# Patient Record
Sex: Female | Born: 1950
Health system: Southern US, Community
[De-identification: ages and names within clinical notes are randomized; demographics above are authoritative.]

## PROBLEM LIST (undated history)

## (undated) DIAGNOSIS — E785 Hyperlipidemia, unspecified: Secondary | ICD-10-CM

## (undated) DIAGNOSIS — K219 Gastro-esophageal reflux disease without esophagitis: Secondary | ICD-10-CM

## (undated) DIAGNOSIS — C679 Malignant neoplasm of bladder, unspecified: Secondary | ICD-10-CM

## (undated) DIAGNOSIS — E669 Obesity, unspecified: Secondary | ICD-10-CM

## (undated) DIAGNOSIS — N183 Chronic kidney disease, stage 3 unspecified: Secondary | ICD-10-CM

## (undated) DIAGNOSIS — Z800 Family history of malignant neoplasm of digestive organs: Secondary | ICD-10-CM

## (undated) DIAGNOSIS — H269 Unspecified cataract: Secondary | ICD-10-CM

## (undated) DIAGNOSIS — Z5189 Encounter for other specified aftercare: Secondary | ICD-10-CM

## (undated) DIAGNOSIS — Z9989 Dependence on other enabling machines and devices: Secondary | ICD-10-CM

## (undated) DIAGNOSIS — Z8542 Personal history of malignant neoplasm of other parts of uterus: Secondary | ICD-10-CM

## (undated) DIAGNOSIS — K632 Fistula of intestine: Secondary | ICD-10-CM

## (undated) DIAGNOSIS — R768 Other specified abnormal immunological findings in serum: Secondary | ICD-10-CM

## (undated) DIAGNOSIS — N189 Chronic kidney disease, unspecified: Secondary | ICD-10-CM

## (undated) DIAGNOSIS — Z86010 Personal history of colonic polyps: Secondary | ICD-10-CM

## (undated) DIAGNOSIS — D631 Anemia in chronic kidney disease: Secondary | ICD-10-CM

## (undated) DIAGNOSIS — E1121 Type 2 diabetes mellitus with diabetic nephropathy: Secondary | ICD-10-CM

## (undated) DIAGNOSIS — Z803 Family history of malignant neoplasm of breast: Secondary | ICD-10-CM

## (undated) DIAGNOSIS — Z85038 Personal history of other malignant neoplasm of large intestine: Secondary | ICD-10-CM

## (undated) DIAGNOSIS — I4510 Unspecified right bundle-branch block: Secondary | ICD-10-CM

## (undated) DIAGNOSIS — I10 Essential (primary) hypertension: Secondary | ICD-10-CM

## (undated) DIAGNOSIS — D509 Iron deficiency anemia, unspecified: Secondary | ICD-10-CM

## (undated) DIAGNOSIS — M47816 Spondylosis without myelopathy or radiculopathy, lumbar region: Secondary | ICD-10-CM

## (undated) DIAGNOSIS — F319 Bipolar disorder, unspecified: Secondary | ICD-10-CM

## (undated) DIAGNOSIS — E1165 Type 2 diabetes mellitus with hyperglycemia: Secondary | ICD-10-CM

## (undated) DIAGNOSIS — Z8551 Personal history of malignant neoplasm of bladder: Secondary | ICD-10-CM

## (undated) DIAGNOSIS — C189 Malignant neoplasm of colon, unspecified: Secondary | ICD-10-CM

## (undated) DIAGNOSIS — G4733 Obstructive sleep apnea (adult) (pediatric): Secondary | ICD-10-CM

## (undated) HISTORY — DX: Unspecified right bundle-branch block: I45.10

## (undated) HISTORY — DX: Spondylosis without myelopathy or radiculopathy, lumbar region: M47.816

## (undated) HISTORY — DX: Bipolar disorder, unspecified: F31.9

## (undated) HISTORY — DX: Dependence on other enabling machines and devices: Z99.89

## (undated) HISTORY — DX: Type 2 diabetes mellitus with hyperglycemia: E11.65

## (undated) HISTORY — DX: Anemia in chronic kidney disease: D63.1

## (undated) HISTORY — DX: Obstructive sleep apnea (adult) (pediatric): G47.33

## (undated) HISTORY — DX: Hyperlipidemia, unspecified: E78.5

## (undated) HISTORY — DX: Unspecified cataract: H26.9

## (undated) HISTORY — DX: Family history of malignant neoplasm of digestive organs: Z80.0

## (undated) HISTORY — DX: Chronic kidney disease, stage 3 (moderate): N18.3

## (undated) HISTORY — DX: Chronic kidney disease, unspecified: N18.9

## (undated) HISTORY — DX: Personal history of colonic polyps: Z86.010

## (undated) HISTORY — DX: Personal history of other malignant neoplasm of large intestine: Z85.038

## (undated) HISTORY — DX: Encounter for other specified aftercare: Z51.89

## (undated) HISTORY — PX: PARTIAL COLECTOMY: SHX5273

## (undated) HISTORY — DX: Type 2 diabetes mellitus with diabetic nephropathy: E11.21

## (undated) HISTORY — DX: Personal history of malignant neoplasm of bladder: Z85.51

## (undated) HISTORY — DX: Iron deficiency anemia, unspecified: D50.9

## (undated) HISTORY — DX: Family history of malignant neoplasm of breast: Z80.3

## (undated) HISTORY — DX: Other specified abnormal immunological findings in serum: R76.8

## (undated) HISTORY — DX: Gastro-esophageal reflux disease without esophagitis: K21.9

## (undated) HISTORY — DX: Personal history of malignant neoplasm of other parts of uterus: Z85.42

## (undated) HISTORY — DX: Chronic kidney disease, stage 3 unspecified: N18.30

---

## 1979-08-05 HISTORY — PX: PARTIAL HYSTERECTOMY: SHX80

## 1989-04-04 DIAGNOSIS — C189 Malignant neoplasm of colon, unspecified: Secondary | ICD-10-CM

## 1989-04-04 DIAGNOSIS — C679 Malignant neoplasm of bladder, unspecified: Secondary | ICD-10-CM

## 1989-04-04 HISTORY — DX: Malignant neoplasm of bladder, unspecified: C67.9

## 1989-04-04 HISTORY — DX: Malignant neoplasm of colon, unspecified: C18.9

## 1995-08-05 DIAGNOSIS — Z8551 Personal history of malignant neoplasm of bladder: Secondary | ICD-10-CM

## 1995-08-05 HISTORY — DX: Personal history of malignant neoplasm of bladder: Z85.51

## 2003-08-05 HISTORY — PX: LEFT OOPHORECTOMY: SHX1961

## 2007-08-05 DIAGNOSIS — Z5189 Encounter for other specified aftercare: Secondary | ICD-10-CM

## 2007-08-05 HISTORY — DX: Encounter for other specified aftercare: Z51.89

## 2009-07-04 HISTORY — PX: COLONOSCOPY: SHX174

## 2009-12-02 HISTORY — PX: OTHER SURGICAL HISTORY: SHX169

## 2009-12-02 HISTORY — PX: DOBUTAMINE STRESS ECHO: SHX5426

## 2009-12-19 LAB — HM DEXA SCAN

## 2011-08-05 DIAGNOSIS — Z85038 Personal history of other malignant neoplasm of large intestine: Secondary | ICD-10-CM | POA: Insufficient documentation

## 2011-08-13 DIAGNOSIS — E669 Obesity, unspecified: Secondary | ICD-10-CM | POA: Diagnosis not present

## 2011-08-13 DIAGNOSIS — E7800 Pure hypercholesterolemia, unspecified: Secondary | ICD-10-CM | POA: Diagnosis not present

## 2011-08-13 DIAGNOSIS — E559 Vitamin D deficiency, unspecified: Secondary | ICD-10-CM | POA: Diagnosis not present

## 2011-08-13 DIAGNOSIS — I10 Essential (primary) hypertension: Secondary | ICD-10-CM | POA: Diagnosis not present

## 2011-08-13 LAB — LIPID PANEL
Cholesterol: 160 mg/dL (ref 0–200)
Direct LDL: 91

## 2011-08-15 DIAGNOSIS — I10 Essential (primary) hypertension: Secondary | ICD-10-CM | POA: Diagnosis not present

## 2011-08-15 DIAGNOSIS — E7800 Pure hypercholesterolemia, unspecified: Secondary | ICD-10-CM | POA: Diagnosis not present

## 2011-08-18 DIAGNOSIS — M2390 Unspecified internal derangement of unspecified knee: Secondary | ICD-10-CM | POA: Diagnosis not present

## 2011-08-18 DIAGNOSIS — M1710 Unilateral primary osteoarthritis, unspecified knee: Secondary | ICD-10-CM | POA: Diagnosis not present

## 2011-09-08 DIAGNOSIS — F329 Major depressive disorder, single episode, unspecified: Secondary | ICD-10-CM | POA: Diagnosis not present

## 2011-09-08 DIAGNOSIS — F259 Schizoaffective disorder, unspecified: Secondary | ICD-10-CM | POA: Diagnosis not present

## 2011-09-19 DIAGNOSIS — E7800 Pure hypercholesterolemia, unspecified: Secondary | ICD-10-CM | POA: Diagnosis not present

## 2011-09-19 DIAGNOSIS — M2390 Unspecified internal derangement of unspecified knee: Secondary | ICD-10-CM | POA: Diagnosis not present

## 2011-09-19 DIAGNOSIS — M159 Polyosteoarthritis, unspecified: Secondary | ICD-10-CM | POA: Diagnosis not present

## 2011-09-19 DIAGNOSIS — G4733 Obstructive sleep apnea (adult) (pediatric): Secondary | ICD-10-CM | POA: Diagnosis not present

## 2011-09-22 DIAGNOSIS — I4510 Unspecified right bundle-branch block: Secondary | ICD-10-CM | POA: Diagnosis not present

## 2011-09-22 DIAGNOSIS — Z0389 Encounter for observation for other suspected diseases and conditions ruled out: Secondary | ICD-10-CM | POA: Diagnosis not present

## 2011-09-22 DIAGNOSIS — I10 Essential (primary) hypertension: Secondary | ICD-10-CM | POA: Diagnosis not present

## 2011-09-22 DIAGNOSIS — Z01810 Encounter for preprocedural cardiovascular examination: Secondary | ICD-10-CM | POA: Diagnosis not present

## 2011-09-25 DIAGNOSIS — I10 Essential (primary) hypertension: Secondary | ICD-10-CM | POA: Diagnosis not present

## 2011-09-25 DIAGNOSIS — Z01818 Encounter for other preprocedural examination: Secondary | ICD-10-CM | POA: Diagnosis not present

## 2011-10-16 DIAGNOSIS — M2390 Unspecified internal derangement of unspecified knee: Secondary | ICD-10-CM | POA: Diagnosis not present

## 2011-10-16 DIAGNOSIS — IMO0002 Reserved for concepts with insufficient information to code with codable children: Secondary | ICD-10-CM | POA: Diagnosis not present

## 2011-10-16 DIAGNOSIS — F329 Major depressive disorder, single episode, unspecified: Secondary | ICD-10-CM | POA: Diagnosis not present

## 2011-10-16 DIAGNOSIS — S83509A Sprain of unspecified cruciate ligament of unspecified knee, initial encounter: Secondary | ICD-10-CM | POA: Diagnosis not present

## 2011-10-16 DIAGNOSIS — E785 Hyperlipidemia, unspecified: Secondary | ICD-10-CM | POA: Diagnosis not present

## 2011-10-16 DIAGNOSIS — I10 Essential (primary) hypertension: Secondary | ICD-10-CM | POA: Diagnosis not present

## 2011-10-16 DIAGNOSIS — S83289A Other tear of lateral meniscus, current injury, unspecified knee, initial encounter: Secondary | ICD-10-CM | POA: Diagnosis not present

## 2011-10-16 DIAGNOSIS — M6789 Other specified disorders of synovium and tendon, multiple sites: Secondary | ICD-10-CM | POA: Diagnosis not present

## 2011-10-16 DIAGNOSIS — M2340 Loose body in knee, unspecified knee: Secondary | ICD-10-CM | POA: Diagnosis not present

## 2011-10-16 DIAGNOSIS — G4730 Sleep apnea, unspecified: Secondary | ICD-10-CM | POA: Diagnosis not present

## 2011-10-16 DIAGNOSIS — M1710 Unilateral primary osteoarthritis, unspecified knee: Secondary | ICD-10-CM | POA: Diagnosis not present

## 2011-11-10 DIAGNOSIS — I10 Essential (primary) hypertension: Secondary | ICD-10-CM | POA: Diagnosis not present

## 2011-11-10 DIAGNOSIS — L258 Unspecified contact dermatitis due to other agents: Secondary | ICD-10-CM | POA: Diagnosis not present

## 2011-11-10 DIAGNOSIS — E7800 Pure hypercholesterolemia, unspecified: Secondary | ICD-10-CM | POA: Diagnosis not present

## 2011-11-11 DIAGNOSIS — F259 Schizoaffective disorder, unspecified: Secondary | ICD-10-CM | POA: Diagnosis not present

## 2011-11-11 DIAGNOSIS — F329 Major depressive disorder, single episode, unspecified: Secondary | ICD-10-CM | POA: Diagnosis not present

## 2011-11-11 DIAGNOSIS — IMO0001 Reserved for inherently not codable concepts without codable children: Secondary | ICD-10-CM | POA: Diagnosis not present

## 2011-11-11 DIAGNOSIS — M1710 Unilateral primary osteoarthritis, unspecified knee: Secondary | ICD-10-CM | POA: Diagnosis not present

## 2011-11-11 DIAGNOSIS — Z96659 Presence of unspecified artificial knee joint: Secondary | ICD-10-CM | POA: Diagnosis not present

## 2011-11-11 DIAGNOSIS — R262 Difficulty in walking, not elsewhere classified: Secondary | ICD-10-CM | POA: Diagnosis not present

## 2011-11-13 DIAGNOSIS — IMO0001 Reserved for inherently not codable concepts without codable children: Secondary | ICD-10-CM | POA: Diagnosis not present

## 2011-11-13 DIAGNOSIS — M1710 Unilateral primary osteoarthritis, unspecified knee: Secondary | ICD-10-CM | POA: Diagnosis not present

## 2011-11-13 DIAGNOSIS — Z96659 Presence of unspecified artificial knee joint: Secondary | ICD-10-CM | POA: Diagnosis not present

## 2011-11-13 DIAGNOSIS — R262 Difficulty in walking, not elsewhere classified: Secondary | ICD-10-CM | POA: Diagnosis not present

## 2011-11-14 DIAGNOSIS — M1710 Unilateral primary osteoarthritis, unspecified knee: Secondary | ICD-10-CM | POA: Diagnosis not present

## 2011-11-14 DIAGNOSIS — R262 Difficulty in walking, not elsewhere classified: Secondary | ICD-10-CM | POA: Diagnosis not present

## 2011-11-14 DIAGNOSIS — IMO0001 Reserved for inherently not codable concepts without codable children: Secondary | ICD-10-CM | POA: Diagnosis not present

## 2011-11-14 DIAGNOSIS — Z96659 Presence of unspecified artificial knee joint: Secondary | ICD-10-CM | POA: Diagnosis not present

## 2011-11-17 DIAGNOSIS — M1710 Unilateral primary osteoarthritis, unspecified knee: Secondary | ICD-10-CM | POA: Diagnosis not present

## 2011-11-17 DIAGNOSIS — Z96659 Presence of unspecified artificial knee joint: Secondary | ICD-10-CM | POA: Diagnosis not present

## 2011-11-17 DIAGNOSIS — R262 Difficulty in walking, not elsewhere classified: Secondary | ICD-10-CM | POA: Diagnosis not present

## 2011-11-17 DIAGNOSIS — IMO0001 Reserved for inherently not codable concepts without codable children: Secondary | ICD-10-CM | POA: Diagnosis not present

## 2011-11-18 DIAGNOSIS — M1710 Unilateral primary osteoarthritis, unspecified knee: Secondary | ICD-10-CM | POA: Diagnosis not present

## 2011-11-18 DIAGNOSIS — Z96659 Presence of unspecified artificial knee joint: Secondary | ICD-10-CM | POA: Diagnosis not present

## 2011-11-18 DIAGNOSIS — IMO0001 Reserved for inherently not codable concepts without codable children: Secondary | ICD-10-CM | POA: Diagnosis not present

## 2011-11-18 DIAGNOSIS — R262 Difficulty in walking, not elsewhere classified: Secondary | ICD-10-CM | POA: Diagnosis not present

## 2011-11-21 DIAGNOSIS — M1710 Unilateral primary osteoarthritis, unspecified knee: Secondary | ICD-10-CM | POA: Diagnosis not present

## 2011-11-21 DIAGNOSIS — IMO0001 Reserved for inherently not codable concepts without codable children: Secondary | ICD-10-CM | POA: Diagnosis not present

## 2011-11-21 DIAGNOSIS — Z96659 Presence of unspecified artificial knee joint: Secondary | ICD-10-CM | POA: Diagnosis not present

## 2011-11-21 DIAGNOSIS — R262 Difficulty in walking, not elsewhere classified: Secondary | ICD-10-CM | POA: Diagnosis not present

## 2011-11-24 DIAGNOSIS — M2390 Unspecified internal derangement of unspecified knee: Secondary | ICD-10-CM | POA: Diagnosis not present

## 2011-12-08 DIAGNOSIS — L299 Pruritus, unspecified: Secondary | ICD-10-CM | POA: Diagnosis not present

## 2011-12-08 DIAGNOSIS — L538 Other specified erythematous conditions: Secondary | ICD-10-CM | POA: Diagnosis not present

## 2011-12-08 DIAGNOSIS — L738 Other specified follicular disorders: Secondary | ICD-10-CM | POA: Diagnosis not present

## 2012-01-05 DIAGNOSIS — K432 Incisional hernia without obstruction or gangrene: Secondary | ICD-10-CM | POA: Diagnosis not present

## 2012-01-19 DIAGNOSIS — E7800 Pure hypercholesterolemia, unspecified: Secondary | ICD-10-CM | POA: Diagnosis not present

## 2012-01-19 DIAGNOSIS — I10 Essential (primary) hypertension: Secondary | ICD-10-CM | POA: Diagnosis not present

## 2012-01-19 DIAGNOSIS — K439 Ventral hernia without obstruction or gangrene: Secondary | ICD-10-CM | POA: Diagnosis not present

## 2012-01-19 DIAGNOSIS — N189 Chronic kidney disease, unspecified: Secondary | ICD-10-CM | POA: Diagnosis not present

## 2012-01-19 DIAGNOSIS — Z01810 Encounter for preprocedural cardiovascular examination: Secondary | ICD-10-CM | POA: Diagnosis not present

## 2012-01-27 DIAGNOSIS — F259 Schizoaffective disorder, unspecified: Secondary | ICD-10-CM | POA: Diagnosis not present

## 2012-01-27 DIAGNOSIS — F329 Major depressive disorder, single episode, unspecified: Secondary | ICD-10-CM | POA: Diagnosis not present

## 2012-01-28 DIAGNOSIS — I10 Essential (primary) hypertension: Secondary | ICD-10-CM | POA: Diagnosis not present

## 2012-01-28 DIAGNOSIS — K439 Ventral hernia without obstruction or gangrene: Secondary | ICD-10-CM | POA: Diagnosis not present

## 2012-01-28 DIAGNOSIS — F259 Schizoaffective disorder, unspecified: Secondary | ICD-10-CM | POA: Diagnosis not present

## 2012-01-28 DIAGNOSIS — N189 Chronic kidney disease, unspecified: Secondary | ICD-10-CM | POA: Diagnosis not present

## 2012-01-28 DIAGNOSIS — Z01818 Encounter for other preprocedural examination: Secondary | ICD-10-CM | POA: Diagnosis not present

## 2012-01-28 LAB — HEPATIC FUNCTION PANEL: AST: 17 U/L

## 2012-01-28 LAB — CBC
WBC: 7.6
platelet count: 146

## 2012-02-04 DIAGNOSIS — F329 Major depressive disorder, single episode, unspecified: Secondary | ICD-10-CM | POA: Diagnosis not present

## 2012-02-04 DIAGNOSIS — E7800 Pure hypercholesterolemia, unspecified: Secondary | ICD-10-CM | POA: Diagnosis not present

## 2012-02-04 DIAGNOSIS — G4730 Sleep apnea, unspecified: Secondary | ICD-10-CM | POA: Diagnosis not present

## 2012-02-04 DIAGNOSIS — K439 Ventral hernia without obstruction or gangrene: Secondary | ICD-10-CM | POA: Diagnosis not present

## 2012-02-04 DIAGNOSIS — K432 Incisional hernia without obstruction or gangrene: Secondary | ICD-10-CM | POA: Diagnosis not present

## 2012-02-04 DIAGNOSIS — K660 Peritoneal adhesions (postprocedural) (postinfection): Secondary | ICD-10-CM | POA: Diagnosis not present

## 2012-02-04 HISTORY — PX: HERNIA REPAIR: SHX51

## 2012-02-05 DIAGNOSIS — R059 Cough, unspecified: Secondary | ICD-10-CM | POA: Diagnosis not present

## 2012-02-05 DIAGNOSIS — E860 Dehydration: Secondary | ICD-10-CM | POA: Diagnosis not present

## 2012-02-05 DIAGNOSIS — R05 Cough: Secondary | ICD-10-CM | POA: Diagnosis not present

## 2012-02-05 DIAGNOSIS — R1084 Generalized abdominal pain: Secondary | ICD-10-CM | POA: Diagnosis not present

## 2012-02-05 DIAGNOSIS — J189 Pneumonia, unspecified organism: Secondary | ICD-10-CM | POA: Diagnosis not present

## 2012-02-05 DIAGNOSIS — N179 Acute kidney failure, unspecified: Secondary | ICD-10-CM | POA: Diagnosis not present

## 2012-02-05 DIAGNOSIS — R109 Unspecified abdominal pain: Secondary | ICD-10-CM | POA: Diagnosis not present

## 2012-02-06 DIAGNOSIS — A419 Sepsis, unspecified organism: Secondary | ICD-10-CM | POA: Diagnosis not present

## 2012-02-06 DIAGNOSIS — D696 Thrombocytopenia, unspecified: Secondary | ICD-10-CM | POA: Diagnosis not present

## 2012-02-06 DIAGNOSIS — K5900 Constipation, unspecified: Secondary | ICD-10-CM | POA: Diagnosis not present

## 2012-02-06 DIAGNOSIS — R6510 Systemic inflammatory response syndrome (SIRS) of non-infectious origin without acute organ dysfunction: Secondary | ICD-10-CM | POA: Diagnosis not present

## 2012-02-06 DIAGNOSIS — F319 Bipolar disorder, unspecified: Secondary | ICD-10-CM | POA: Diagnosis not present

## 2012-02-06 DIAGNOSIS — I4510 Unspecified right bundle-branch block: Secondary | ICD-10-CM | POA: Diagnosis present

## 2012-02-06 DIAGNOSIS — I129 Hypertensive chronic kidney disease with stage 1 through stage 4 chronic kidney disease, or unspecified chronic kidney disease: Secondary | ICD-10-CM | POA: Diagnosis not present

## 2012-02-06 DIAGNOSIS — I10 Essential (primary) hypertension: Secondary | ICD-10-CM | POA: Diagnosis not present

## 2012-02-06 DIAGNOSIS — R509 Fever, unspecified: Secondary | ICD-10-CM | POA: Diagnosis present

## 2012-02-06 DIAGNOSIS — R059 Cough, unspecified: Secondary | ICD-10-CM | POA: Diagnosis not present

## 2012-02-06 DIAGNOSIS — R109 Unspecified abdominal pain: Secondary | ICD-10-CM | POA: Diagnosis present

## 2012-02-06 DIAGNOSIS — N183 Chronic kidney disease, stage 3 unspecified: Secondary | ICD-10-CM | POA: Diagnosis not present

## 2012-02-06 DIAGNOSIS — E785 Hyperlipidemia, unspecified: Secondary | ICD-10-CM | POA: Diagnosis not present

## 2012-02-06 DIAGNOSIS — J9819 Other pulmonary collapse: Secondary | ICD-10-CM | POA: Diagnosis present

## 2012-02-06 DIAGNOSIS — K219 Gastro-esophageal reflux disease without esophagitis: Secondary | ICD-10-CM | POA: Diagnosis present

## 2012-02-06 DIAGNOSIS — E860 Dehydration: Secondary | ICD-10-CM | POA: Diagnosis present

## 2012-02-06 DIAGNOSIS — Z8551 Personal history of malignant neoplasm of bladder: Secondary | ICD-10-CM | POA: Diagnosis not present

## 2012-02-06 DIAGNOSIS — F3130 Bipolar disorder, current episode depressed, mild or moderate severity, unspecified: Secondary | ICD-10-CM | POA: Diagnosis present

## 2012-02-06 DIAGNOSIS — J189 Pneumonia, unspecified organism: Secondary | ICD-10-CM | POA: Diagnosis not present

## 2012-02-06 DIAGNOSIS — G4733 Obstructive sleep apnea (adult) (pediatric): Secondary | ICD-10-CM | POA: Diagnosis present

## 2012-02-06 DIAGNOSIS — N179 Acute kidney failure, unspecified: Secondary | ICD-10-CM | POA: Diagnosis not present

## 2012-02-06 DIAGNOSIS — R3989 Other symptoms and signs involving the genitourinary system: Secondary | ICD-10-CM | POA: Diagnosis present

## 2012-02-06 DIAGNOSIS — R05 Cough: Secondary | ICD-10-CM | POA: Diagnosis present

## 2012-02-12 DIAGNOSIS — Z01810 Encounter for preprocedural cardiovascular examination: Secondary | ICD-10-CM | POA: Diagnosis not present

## 2012-02-12 DIAGNOSIS — I10 Essential (primary) hypertension: Secondary | ICD-10-CM | POA: Diagnosis not present

## 2012-02-12 DIAGNOSIS — R609 Edema, unspecified: Secondary | ICD-10-CM | POA: Diagnosis not present

## 2012-02-18 DIAGNOSIS — L03319 Cellulitis of trunk, unspecified: Secondary | ICD-10-CM | POA: Diagnosis not present

## 2012-02-18 DIAGNOSIS — J9600 Acute respiratory failure, unspecified whether with hypoxia or hypercapnia: Secondary | ICD-10-CM | POA: Diagnosis not present

## 2012-02-18 DIAGNOSIS — T8579XA Infection and inflammatory reaction due to other internal prosthetic devices, implants and grafts, initial encounter: Secondary | ICD-10-CM | POA: Diagnosis not present

## 2012-02-18 DIAGNOSIS — K651 Peritoneal abscess: Secondary | ICD-10-CM | POA: Diagnosis not present

## 2012-02-18 DIAGNOSIS — D72829 Elevated white blood cell count, unspecified: Secondary | ICD-10-CM | POA: Diagnosis not present

## 2012-02-18 DIAGNOSIS — D72825 Bandemia: Secondary | ICD-10-CM | POA: Diagnosis not present

## 2012-02-19 DIAGNOSIS — Z8553 Personal history of malignant neoplasm of renal pelvis: Secondary | ICD-10-CM | POA: Diagnosis not present

## 2012-02-19 DIAGNOSIS — L02219 Cutaneous abscess of trunk, unspecified: Secondary | ICD-10-CM | POA: Diagnosis not present

## 2012-02-19 DIAGNOSIS — A4902 Methicillin resistant Staphylococcus aureus infection, unspecified site: Secondary | ICD-10-CM | POA: Diagnosis not present

## 2012-02-19 DIAGNOSIS — R1319 Other dysphagia: Secondary | ICD-10-CM | POA: Diagnosis not present

## 2012-02-19 DIAGNOSIS — G4730 Sleep apnea, unspecified: Secondary | ICD-10-CM | POA: Diagnosis not present

## 2012-02-19 DIAGNOSIS — R262 Difficulty in walking, not elsewhere classified: Secondary | ICD-10-CM | POA: Diagnosis not present

## 2012-02-19 DIAGNOSIS — R3989 Other symptoms and signs involving the genitourinary system: Secondary | ICD-10-CM | POA: Diagnosis not present

## 2012-02-19 DIAGNOSIS — R4182 Altered mental status, unspecified: Secondary | ICD-10-CM | POA: Diagnosis not present

## 2012-02-19 DIAGNOSIS — Z9981 Dependence on supplemental oxygen: Secondary | ICD-10-CM | POA: Diagnosis not present

## 2012-02-19 DIAGNOSIS — R1013 Epigastric pain: Secondary | ICD-10-CM | POA: Diagnosis not present

## 2012-02-19 DIAGNOSIS — T148XXA Other injury of unspecified body region, initial encounter: Secondary | ICD-10-CM | POA: Diagnosis not present

## 2012-02-19 DIAGNOSIS — I129 Hypertensive chronic kidney disease with stage 1 through stage 4 chronic kidney disease, or unspecified chronic kidney disease: Secondary | ICD-10-CM | POA: Diagnosis present

## 2012-02-19 DIAGNOSIS — K668 Other specified disorders of peritoneum: Secondary | ICD-10-CM | POA: Diagnosis present

## 2012-02-19 DIAGNOSIS — D72829 Elevated white blood cell count, unspecified: Secondary | ICD-10-CM | POA: Diagnosis not present

## 2012-02-19 DIAGNOSIS — Z5189 Encounter for other specified aftercare: Secondary | ICD-10-CM | POA: Diagnosis not present

## 2012-02-19 DIAGNOSIS — J988 Other specified respiratory disorders: Secondary | ICD-10-CM | POA: Diagnosis not present

## 2012-02-19 DIAGNOSIS — Z1639 Resistance to other specified antimicrobial drug: Secondary | ICD-10-CM | POA: Diagnosis present

## 2012-02-19 DIAGNOSIS — F3130 Bipolar disorder, current episode depressed, mild or moderate severity, unspecified: Secondary | ICD-10-CM | POA: Diagnosis not present

## 2012-02-19 DIAGNOSIS — J9600 Acute respiratory failure, unspecified whether with hypoxia or hypercapnia: Secondary | ICD-10-CM | POA: Diagnosis not present

## 2012-02-19 DIAGNOSIS — A419 Sepsis, unspecified organism: Secondary | ICD-10-CM | POA: Diagnosis not present

## 2012-02-19 DIAGNOSIS — T8579XA Infection and inflammatory reaction due to other internal prosthetic devices, implants and grafts, initial encounter: Secondary | ICD-10-CM | POA: Diagnosis not present

## 2012-02-19 DIAGNOSIS — F319 Bipolar disorder, unspecified: Secondary | ICD-10-CM | POA: Diagnosis not present

## 2012-02-19 DIAGNOSIS — I519 Heart disease, unspecified: Secondary | ICD-10-CM | POA: Diagnosis not present

## 2012-02-19 DIAGNOSIS — T8132XA Disruption of internal operation (surgical) wound, not elsewhere classified, initial encounter: Secondary | ICD-10-CM | POA: Diagnosis not present

## 2012-02-19 DIAGNOSIS — R34 Anuria and oliguria: Secondary | ICD-10-CM | POA: Diagnosis not present

## 2012-02-19 DIAGNOSIS — J9610 Chronic respiratory failure, unspecified whether with hypoxia or hypercapnia: Secondary | ICD-10-CM | POA: Diagnosis not present

## 2012-02-19 DIAGNOSIS — R509 Fever, unspecified: Secondary | ICD-10-CM | POA: Diagnosis not present

## 2012-02-19 DIAGNOSIS — F341 Dysthymic disorder: Secondary | ICD-10-CM | POA: Diagnosis not present

## 2012-02-19 DIAGNOSIS — N171 Acute kidney failure with acute cortical necrosis: Secondary | ICD-10-CM | POA: Diagnosis not present

## 2012-02-19 DIAGNOSIS — D509 Iron deficiency anemia, unspecified: Secondary | ICD-10-CM | POA: Diagnosis not present

## 2012-02-19 DIAGNOSIS — I10 Essential (primary) hypertension: Secondary | ICD-10-CM | POA: Diagnosis not present

## 2012-02-19 DIAGNOSIS — I951 Orthostatic hypotension: Secondary | ICD-10-CM | POA: Diagnosis not present

## 2012-02-19 DIAGNOSIS — R5383 Other fatigue: Secondary | ICD-10-CM | POA: Diagnosis not present

## 2012-02-19 DIAGNOSIS — T85898A Other specified complication of other internal prosthetic devices, implants and grafts, initial encounter: Secondary | ICD-10-CM | POA: Diagnosis present

## 2012-02-19 DIAGNOSIS — D649 Anemia, unspecified: Secondary | ICD-10-CM | POA: Diagnosis not present

## 2012-02-19 DIAGNOSIS — T8140XA Infection following a procedure, unspecified, initial encounter: Secondary | ICD-10-CM | POA: Diagnosis not present

## 2012-02-19 DIAGNOSIS — M109 Gout, unspecified: Secondary | ICD-10-CM | POA: Diagnosis present

## 2012-02-19 DIAGNOSIS — M6281 Muscle weakness (generalized): Secondary | ICD-10-CM | POA: Diagnosis not present

## 2012-02-19 DIAGNOSIS — IMO0002 Reserved for concepts with insufficient information to code with codable children: Secondary | ICD-10-CM | POA: Diagnosis not present

## 2012-02-19 DIAGNOSIS — G4733 Obstructive sleep apnea (adult) (pediatric): Secondary | ICD-10-CM | POA: Diagnosis not present

## 2012-02-19 DIAGNOSIS — D72825 Bandemia: Secondary | ICD-10-CM | POA: Diagnosis not present

## 2012-02-19 DIAGNOSIS — E785 Hyperlipidemia, unspecified: Secondary | ICD-10-CM | POA: Diagnosis not present

## 2012-02-19 DIAGNOSIS — I959 Hypotension, unspecified: Secondary | ICD-10-CM | POA: Diagnosis present

## 2012-02-19 DIAGNOSIS — K219 Gastro-esophageal reflux disease without esophagitis: Secondary | ICD-10-CM | POA: Diagnosis present

## 2012-02-19 DIAGNOSIS — R000 Tachycardia, unspecified: Secondary | ICD-10-CM | POA: Diagnosis not present

## 2012-02-19 DIAGNOSIS — K651 Peritoneal abscess: Secondary | ICD-10-CM | POA: Diagnosis not present

## 2012-02-19 DIAGNOSIS — L03319 Cellulitis of trunk, unspecified: Secondary | ICD-10-CM | POA: Diagnosis present

## 2012-02-19 DIAGNOSIS — J90 Pleural effusion, not elsewhere classified: Secondary | ICD-10-CM | POA: Diagnosis not present

## 2012-02-19 DIAGNOSIS — M79609 Pain in unspecified limb: Secondary | ICD-10-CM | POA: Diagnosis not present

## 2012-02-19 DIAGNOSIS — K439 Ventral hernia without obstruction or gangrene: Secondary | ICD-10-CM | POA: Diagnosis not present

## 2012-02-19 DIAGNOSIS — E669 Obesity, unspecified: Secondary | ICD-10-CM | POA: Diagnosis not present

## 2012-02-19 DIAGNOSIS — F329 Major depressive disorder, single episode, unspecified: Secondary | ICD-10-CM | POA: Diagnosis present

## 2012-02-19 DIAGNOSIS — R489 Unspecified symbolic dysfunctions: Secondary | ICD-10-CM | POA: Diagnosis not present

## 2012-02-19 HISTORY — PX: OTHER SURGICAL HISTORY: SHX169

## 2012-02-24 HISTORY — PX: OTHER SURGICAL HISTORY: SHX169

## 2012-02-26 HISTORY — PX: OTHER SURGICAL HISTORY: SHX169

## 2012-03-04 DIAGNOSIS — R262 Difficulty in walking, not elsewhere classified: Secondary | ICD-10-CM | POA: Diagnosis not present

## 2012-03-04 DIAGNOSIS — R5383 Other fatigue: Secondary | ICD-10-CM | POA: Diagnosis not present

## 2012-03-04 DIAGNOSIS — R489 Unspecified symbolic dysfunctions: Secondary | ICD-10-CM | POA: Diagnosis not present

## 2012-03-04 DIAGNOSIS — F29 Unspecified psychosis not due to a substance or known physiological condition: Secondary | ICD-10-CM | POA: Diagnosis not present

## 2012-03-04 DIAGNOSIS — E875 Hyperkalemia: Secondary | ICD-10-CM | POA: Diagnosis not present

## 2012-03-04 DIAGNOSIS — Z9981 Dependence on supplemental oxygen: Secondary | ICD-10-CM | POA: Diagnosis not present

## 2012-03-04 DIAGNOSIS — T148XXA Other injury of unspecified body region, initial encounter: Secondary | ICD-10-CM | POA: Diagnosis not present

## 2012-03-04 DIAGNOSIS — Z5189 Encounter for other specified aftercare: Secondary | ICD-10-CM | POA: Diagnosis not present

## 2012-03-04 DIAGNOSIS — M6281 Muscle weakness (generalized): Secondary | ICD-10-CM | POA: Diagnosis not present

## 2012-03-04 DIAGNOSIS — F319 Bipolar disorder, unspecified: Secondary | ICD-10-CM | POA: Diagnosis not present

## 2012-03-04 DIAGNOSIS — I10 Essential (primary) hypertension: Secondary | ICD-10-CM | POA: Diagnosis not present

## 2012-03-04 DIAGNOSIS — L089 Local infection of the skin and subcutaneous tissue, unspecified: Secondary | ICD-10-CM | POA: Diagnosis not present

## 2012-03-04 DIAGNOSIS — E785 Hyperlipidemia, unspecified: Secondary | ICD-10-CM | POA: Diagnosis not present

## 2012-03-04 DIAGNOSIS — F3130 Bipolar disorder, current episode depressed, mild or moderate severity, unspecified: Secondary | ICD-10-CM | POA: Diagnosis not present

## 2012-03-04 DIAGNOSIS — R1319 Other dysphagia: Secondary | ICD-10-CM | POA: Diagnosis not present

## 2012-03-04 DIAGNOSIS — T8140XA Infection following a procedure, unspecified, initial encounter: Secondary | ICD-10-CM | POA: Diagnosis not present

## 2012-03-05 DIAGNOSIS — I10 Essential (primary) hypertension: Secondary | ICD-10-CM | POA: Diagnosis not present

## 2012-03-05 DIAGNOSIS — E785 Hyperlipidemia, unspecified: Secondary | ICD-10-CM | POA: Diagnosis not present

## 2012-03-05 DIAGNOSIS — L089 Local infection of the skin and subcutaneous tissue, unspecified: Secondary | ICD-10-CM | POA: Diagnosis not present

## 2012-03-10 DIAGNOSIS — F29 Unspecified psychosis not due to a substance or known physiological condition: Secondary | ICD-10-CM | POA: Diagnosis not present

## 2012-03-10 DIAGNOSIS — E785 Hyperlipidemia, unspecified: Secondary | ICD-10-CM | POA: Diagnosis not present

## 2012-03-10 DIAGNOSIS — T8140XA Infection following a procedure, unspecified, initial encounter: Secondary | ICD-10-CM | POA: Diagnosis not present

## 2012-03-15 DIAGNOSIS — E875 Hyperkalemia: Secondary | ICD-10-CM | POA: Diagnosis not present

## 2012-03-15 DIAGNOSIS — T8140XA Infection following a procedure, unspecified, initial encounter: Secondary | ICD-10-CM | POA: Diagnosis not present

## 2012-03-17 DIAGNOSIS — T8140XA Infection following a procedure, unspecified, initial encounter: Secondary | ICD-10-CM | POA: Diagnosis not present

## 2012-03-17 DIAGNOSIS — E875 Hyperkalemia: Secondary | ICD-10-CM | POA: Diagnosis not present

## 2012-03-26 DIAGNOSIS — E875 Hyperkalemia: Secondary | ICD-10-CM | POA: Diagnosis not present

## 2012-03-26 DIAGNOSIS — T8140XA Infection following a procedure, unspecified, initial encounter: Secondary | ICD-10-CM | POA: Diagnosis not present

## 2012-03-26 DIAGNOSIS — I10 Essential (primary) hypertension: Secondary | ICD-10-CM | POA: Diagnosis not present

## 2012-03-27 DIAGNOSIS — M6281 Muscle weakness (generalized): Secondary | ICD-10-CM | POA: Diagnosis not present

## 2012-03-27 DIAGNOSIS — I10 Essential (primary) hypertension: Secondary | ICD-10-CM | POA: Diagnosis not present

## 2012-03-27 DIAGNOSIS — F329 Major depressive disorder, single episode, unspecified: Secondary | ICD-10-CM | POA: Diagnosis not present

## 2012-03-27 DIAGNOSIS — E785 Hyperlipidemia, unspecified: Secondary | ICD-10-CM | POA: Diagnosis not present

## 2012-03-27 DIAGNOSIS — T8189XA Other complications of procedures, not elsewhere classified, initial encounter: Secondary | ICD-10-CM | POA: Diagnosis not present

## 2012-03-28 DIAGNOSIS — F329 Major depressive disorder, single episode, unspecified: Secondary | ICD-10-CM | POA: Diagnosis not present

## 2012-03-28 DIAGNOSIS — M6281 Muscle weakness (generalized): Secondary | ICD-10-CM | POA: Diagnosis not present

## 2012-03-28 DIAGNOSIS — I10 Essential (primary) hypertension: Secondary | ICD-10-CM | POA: Diagnosis not present

## 2012-03-28 DIAGNOSIS — T8189XA Other complications of procedures, not elsewhere classified, initial encounter: Secondary | ICD-10-CM | POA: Diagnosis not present

## 2012-03-28 DIAGNOSIS — E785 Hyperlipidemia, unspecified: Secondary | ICD-10-CM | POA: Diagnosis not present

## 2012-03-30 DIAGNOSIS — F329 Major depressive disorder, single episode, unspecified: Secondary | ICD-10-CM | POA: Diagnosis not present

## 2012-03-30 DIAGNOSIS — E785 Hyperlipidemia, unspecified: Secondary | ICD-10-CM | POA: Diagnosis not present

## 2012-03-30 DIAGNOSIS — I10 Essential (primary) hypertension: Secondary | ICD-10-CM | POA: Diagnosis not present

## 2012-03-30 DIAGNOSIS — T8189XA Other complications of procedures, not elsewhere classified, initial encounter: Secondary | ICD-10-CM | POA: Diagnosis not present

## 2012-03-30 DIAGNOSIS — M6281 Muscle weakness (generalized): Secondary | ICD-10-CM | POA: Diagnosis not present

## 2012-03-31 DIAGNOSIS — M6281 Muscle weakness (generalized): Secondary | ICD-10-CM | POA: Diagnosis not present

## 2012-03-31 DIAGNOSIS — F329 Major depressive disorder, single episode, unspecified: Secondary | ICD-10-CM | POA: Diagnosis not present

## 2012-03-31 DIAGNOSIS — T8189XA Other complications of procedures, not elsewhere classified, initial encounter: Secondary | ICD-10-CM | POA: Diagnosis not present

## 2012-03-31 DIAGNOSIS — E785 Hyperlipidemia, unspecified: Secondary | ICD-10-CM | POA: Diagnosis not present

## 2012-03-31 DIAGNOSIS — I10 Essential (primary) hypertension: Secondary | ICD-10-CM | POA: Diagnosis not present

## 2012-04-01 DIAGNOSIS — F329 Major depressive disorder, single episode, unspecified: Secondary | ICD-10-CM | POA: Diagnosis not present

## 2012-04-01 DIAGNOSIS — I10 Essential (primary) hypertension: Secondary | ICD-10-CM | POA: Diagnosis not present

## 2012-04-01 DIAGNOSIS — T8189XA Other complications of procedures, not elsewhere classified, initial encounter: Secondary | ICD-10-CM | POA: Diagnosis not present

## 2012-04-01 DIAGNOSIS — E785 Hyperlipidemia, unspecified: Secondary | ICD-10-CM | POA: Diagnosis not present

## 2012-04-01 DIAGNOSIS — M6281 Muscle weakness (generalized): Secondary | ICD-10-CM | POA: Diagnosis not present

## 2012-04-02 DIAGNOSIS — I10 Essential (primary) hypertension: Secondary | ICD-10-CM | POA: Diagnosis not present

## 2012-04-02 DIAGNOSIS — E785 Hyperlipidemia, unspecified: Secondary | ICD-10-CM | POA: Diagnosis not present

## 2012-04-02 DIAGNOSIS — F329 Major depressive disorder, single episode, unspecified: Secondary | ICD-10-CM | POA: Diagnosis not present

## 2012-04-02 DIAGNOSIS — T8189XA Other complications of procedures, not elsewhere classified, initial encounter: Secondary | ICD-10-CM | POA: Diagnosis not present

## 2012-04-02 DIAGNOSIS — M6281 Muscle weakness (generalized): Secondary | ICD-10-CM | POA: Diagnosis not present

## 2012-04-06 ENCOUNTER — Telehealth: Payer: Self-pay | Admitting: Family Medicine

## 2012-04-06 NOTE — Telephone Encounter (Signed)
I can't order home health without seeing patient.  If had surgery here ,needs to call surgeon. If had surgery in [place], recommend call surgeon there to have [facility] established locally here.

## 2012-04-06 NOTE — Telephone Encounter (Signed)
Caller: Gina [name]/Patient; Phone: [phone]; Reason for Call: Pt moved recently from [place]; was to have [place] after hernia surgery but no one has come yet.  She is running out of bandages and doesn't know who to call.  Please call her to advise.

## 2012-04-07 ENCOUNTER — Encounter (HOSPITAL_COMMUNITY): Payer: Self-pay | Admitting: Emergency Medicine

## 2012-04-07 ENCOUNTER — Emergency Department (HOSPITAL_COMMUNITY): Payer: Medicare Other

## 2012-04-07 ENCOUNTER — Inpatient Hospital Stay (HOSPITAL_COMMUNITY)
Admission: EM | Admit: 2012-04-07 | Discharge: 2012-04-13 | DRG: 394 | Disposition: A | Discharge status: Skilled Nursing Facility | Payer: Medicare Other | Attending: General Surgery | Admitting: General Surgery

## 2012-04-07 ENCOUNTER — Other Ambulatory Visit: Payer: Self-pay

## 2012-04-07 DIAGNOSIS — Z043 Encounter for examination and observation following other accident: Secondary | ICD-10-CM | POA: Diagnosis not present

## 2012-04-07 DIAGNOSIS — E46 Unspecified protein-calorie malnutrition: Secondary | ICD-10-CM | POA: Diagnosis present

## 2012-04-07 DIAGNOSIS — S0510XA Contusion of eyeball and orbital tissues, unspecified eye, initial encounter: Secondary | ICD-10-CM | POA: Diagnosis not present

## 2012-04-07 DIAGNOSIS — R12 Heartburn: Secondary | ICD-10-CM | POA: Diagnosis present

## 2012-04-07 DIAGNOSIS — N189 Chronic kidney disease, unspecified: Secondary | ICD-10-CM | POA: Diagnosis present

## 2012-04-07 DIAGNOSIS — T8140XA Infection following a procedure, unspecified, initial encounter: Secondary | ICD-10-CM | POA: Diagnosis not present

## 2012-04-07 DIAGNOSIS — C569 Malignant neoplasm of unspecified ovary: Secondary | ICD-10-CM | POA: Diagnosis present

## 2012-04-07 DIAGNOSIS — S0083XA Contusion of other part of head, initial encounter: Secondary | ICD-10-CM | POA: Diagnosis not present

## 2012-04-07 DIAGNOSIS — Z79899 Other long term (current) drug therapy: Secondary | ICD-10-CM | POA: Diagnosis not present

## 2012-04-07 DIAGNOSIS — W19XXXA Unspecified fall, initial encounter: Secondary | ICD-10-CM | POA: Diagnosis present

## 2012-04-07 DIAGNOSIS — E669 Obesity, unspecified: Secondary | ICD-10-CM | POA: Diagnosis present

## 2012-04-07 DIAGNOSIS — S0003XA Contusion of scalp, initial encounter: Secondary | ICD-10-CM | POA: Diagnosis present

## 2012-04-07 DIAGNOSIS — I10 Essential (primary) hypertension: Secondary | ICD-10-CM

## 2012-04-07 DIAGNOSIS — F411 Generalized anxiety disorder: Secondary | ICD-10-CM | POA: Diagnosis present

## 2012-04-07 DIAGNOSIS — S1093XA Contusion of unspecified part of neck, initial encounter: Secondary | ICD-10-CM | POA: Diagnosis present

## 2012-04-07 DIAGNOSIS — Z9049 Acquired absence of other specified parts of digestive tract: Secondary | ICD-10-CM | POA: Diagnosis not present

## 2012-04-07 DIAGNOSIS — Z85038 Personal history of other malignant neoplasm of large intestine: Secondary | ICD-10-CM

## 2012-04-07 DIAGNOSIS — F319 Bipolar disorder, unspecified: Secondary | ICD-10-CM | POA: Diagnosis not present

## 2012-04-07 DIAGNOSIS — K632 Fistula of intestine: Principal | ICD-10-CM

## 2012-04-07 DIAGNOSIS — F3130 Bipolar disorder, current episode depressed, mild or moderate severity, unspecified: Secondary | ICD-10-CM | POA: Diagnosis present

## 2012-04-07 DIAGNOSIS — Z6829 Body mass index (BMI) 29.0-29.9, adult: Secondary | ICD-10-CM

## 2012-04-07 DIAGNOSIS — Z880 Allergy status to penicillin: Secondary | ICD-10-CM

## 2012-04-07 DIAGNOSIS — I129 Hypertensive chronic kidney disease with stage 1 through stage 4 chronic kidney disease, or unspecified chronic kidney disease: Secondary | ICD-10-CM | POA: Diagnosis present

## 2012-04-07 DIAGNOSIS — L02211 Cutaneous abscess of abdominal wall: Secondary | ICD-10-CM

## 2012-04-07 DIAGNOSIS — Z886 Allergy status to analgesic agent status: Secondary | ICD-10-CM

## 2012-04-07 DIAGNOSIS — Y832 Surgical operation with anastomosis, bypass or graft as the cause of abnormal reaction of the patient, or of later complication, without mention of misadventure at the time of the procedure: Secondary | ICD-10-CM | POA: Diagnosis present

## 2012-04-07 DIAGNOSIS — L02219 Cutaneous abscess of trunk, unspecified: Secondary | ICD-10-CM | POA: Diagnosis not present

## 2012-04-07 DIAGNOSIS — R5383 Other fatigue: Secondary | ICD-10-CM | POA: Diagnosis not present

## 2012-04-07 DIAGNOSIS — K5900 Constipation, unspecified: Secondary | ICD-10-CM | POA: Diagnosis present

## 2012-04-07 DIAGNOSIS — Z8614 Personal history of Methicillin resistant Staphylococcus aureus infection: Secondary | ICD-10-CM | POA: Diagnosis not present

## 2012-04-07 DIAGNOSIS — G4730 Sleep apnea, unspecified: Secondary | ICD-10-CM | POA: Diagnosis not present

## 2012-04-07 DIAGNOSIS — T8189XA Other complications of procedures, not elsewhere classified, initial encounter: Secondary | ICD-10-CM | POA: Diagnosis not present

## 2012-04-07 DIAGNOSIS — R109 Unspecified abdominal pain: Secondary | ICD-10-CM | POA: Diagnosis not present

## 2012-04-07 DIAGNOSIS — G4733 Obstructive sleep apnea (adult) (pediatric): Secondary | ICD-10-CM | POA: Diagnosis present

## 2012-04-07 DIAGNOSIS — I82409 Acute embolism and thrombosis of unspecified deep veins of unspecified lower extremity: Secondary | ICD-10-CM | POA: Diagnosis present

## 2012-04-07 DIAGNOSIS — L03319 Cellulitis of trunk, unspecified: Secondary | ICD-10-CM | POA: Diagnosis not present

## 2012-04-07 DIAGNOSIS — Z90710 Acquired absence of both cervix and uterus: Secondary | ICD-10-CM | POA: Diagnosis not present

## 2012-04-07 DIAGNOSIS — D638 Anemia in other chronic diseases classified elsewhere: Secondary | ICD-10-CM | POA: Diagnosis present

## 2012-04-07 DIAGNOSIS — C189 Malignant neoplasm of colon, unspecified: Secondary | ICD-10-CM | POA: Diagnosis not present

## 2012-04-07 DIAGNOSIS — Y92009 Unspecified place in unspecified non-institutional (private) residence as the place of occurrence of the external cause: Secondary | ICD-10-CM

## 2012-04-07 DIAGNOSIS — Z9079 Acquired absence of other genital organ(s): Secondary | ICD-10-CM

## 2012-04-07 HISTORY — DX: Fistula of intestine: K63.2

## 2012-04-07 HISTORY — DX: Obesity, unspecified: E66.9

## 2012-04-07 HISTORY — DX: Essential (primary) hypertension: I10

## 2012-04-07 HISTORY — DX: Malignant neoplasm of colon, unspecified: C18.9

## 2012-04-07 LAB — COMPREHENSIVE METABOLIC PANEL
Albumin: 2.7 g/dL — ABNORMAL LOW (ref 3.5–5.2)
Alkaline Phosphatase: 67 U/L (ref 39–117)
BUN: 26 mg/dL — ABNORMAL HIGH (ref 6–23)
Calcium: 9.4 mg/dL (ref 8.4–10.5)
Creatinine, Ser: 1.17 mg/dL — ABNORMAL HIGH (ref 0.50–1.10)
GFR calc Af Amer: 57 mL/min — ABNORMAL LOW (ref >90)
Glucose, Bld: 86 mg/dL (ref 70–99)
Potassium: 4.3 mEq/L (ref 3.5–5.1)
Total Protein: 7.4 g/dL (ref 6.0–8.3)

## 2012-04-07 LAB — CBC WITH DIFFERENTIAL/PLATELET
Basophils Absolute: 0 10*3/uL (ref 0.0–0.1)
Eosinophils Absolute: 0.2 10*3/uL (ref 0.0–0.7)
Lymphs Abs: 1.5 10*3/uL (ref 0.7–4.0)
MCHC: 32.7 g/dL (ref 30.0–36.0)
MCV: 90.9 fL (ref 78.0–100.0)
Monocytes Relative: 12 % (ref 3–12)
Platelets: ADEQUATE 10*3/uL (ref 150–400)
RDW: 14.2 % (ref 11.5–15.5)
WBC: 11 10*3/uL — ABNORMAL HIGH (ref 4.0–10.5)

## 2012-04-07 LAB — URINALYSIS, ROUTINE W REFLEX MICROSCOPIC
Nitrite: NEGATIVE
Protein, ur: NEGATIVE mg/dL
Specific Gravity, Urine: 1.011 (ref 1.005–1.030)
Urobilinogen, UA: 0.2 mg/dL (ref 0.0–1.0)

## 2012-04-07 LAB — CREATININE, SERUM
Creatinine, Ser: 1.14 mg/dL — ABNORMAL HIGH (ref 0.50–1.10)
GFR calc Af Amer: 59 mL/min — ABNORMAL LOW (ref >90)
GFR calc non Af Amer: 51 mL/min — ABNORMAL LOW (ref >90)

## 2012-04-07 LAB — CBC
Hemoglobin: 9.2 g/dL — ABNORMAL LOW (ref 12.0–15.0)
RBC: 3.15 MIL/uL — ABNORMAL LOW (ref 3.87–5.11)
WBC: 10.2 10*3/uL (ref 4.0–10.5)

## 2012-04-07 LAB — PROTIME-INR: INR: 1.18 (ref 0.00–1.49)

## 2012-04-07 LAB — TROPONIN I
Troponin I: <0.3 ng/mL (ref <0.30)
Troponin I: <0.3 ng/mL (ref <0.30)

## 2012-04-07 LAB — APTT: aPTT: 35 seconds (ref 24–37)

## 2012-04-07 LAB — LIPASE, BLOOD: Lipase: 9 U/L — ABNORMAL LOW (ref 11–59)

## 2012-04-07 MED ORDER — CLINDAMYCIN PHOSPHATE 300 MG/50ML IV SOLN
300.0000 mg | Freq: Three times a day (TID) | INTRAVENOUS | Status: DC
  Administered 2012-04-07 – 2012-04-08 (×3): 300 mg via INTRAVENOUS
  Filled 2012-04-07 (×5): qty 50

## 2012-04-07 MED ORDER — SODIUM CHLORIDE 0.9 % IV BOLUS (SEPSIS)
1000.0000 mL | Freq: Once | INTRAVENOUS | Status: AC
  Administered 2012-04-07: 1000 mL via INTRAVENOUS

## 2012-04-07 MED ORDER — METRONIDAZOLE IN NACL 5-0.79 MG/ML-% IV SOLN
500.0000 mg | Freq: Three times a day (TID) | INTRAVENOUS | Status: DC
  Administered 2012-04-08 – 2012-04-13 (×17): 500 mg via INTRAVENOUS
  Filled 2012-04-07 (×18): qty 100

## 2012-04-07 MED ORDER — FLUOXETINE HCL 20 MG PO CAPS
40.0000 mg | ORAL_CAPSULE | Freq: Every day | ORAL | Status: DC
  Administered 2012-04-07 – 2012-04-13 (×7): 40 mg via ORAL
  Filled 2012-04-07 (×7): qty 2

## 2012-04-07 MED ORDER — ACETAMINOPHEN 325 MG PO TABS
650.0000 mg | ORAL_TABLET | Freq: Four times a day (QID) | ORAL | Status: DC | PRN
  Administered 2012-04-11 – 2012-04-12 (×2): 650 mg via ORAL
  Filled 2012-04-07 (×2): qty 2

## 2012-04-07 MED ORDER — VANCOMYCIN HCL 1000 MG IV SOLR
750.0000 mg | Freq: Two times a day (BID) | INTRAVENOUS | Status: DC
  Administered 2012-04-07 – 2012-04-08 (×4): 750 mg via INTRAVENOUS
  Filled 2012-04-07 (×3): qty 750

## 2012-04-07 MED ORDER — ACETAMINOPHEN 650 MG RE SUPP: 650.0000 mg | Freq: Four times a day (QID) | RECTAL | Status: DC | PRN

## 2012-04-07 MED ORDER — HYDROCODONE-ACETAMINOPHEN 5-325 MG PO TABS
1.0000 | ORAL_TABLET | ORAL | Status: DC | PRN
  Administered 2012-04-07 – 2012-04-10 (×4): 1 via ORAL
  Filled 2012-04-07 (×4): qty 1
  Filled 2012-04-07: qty 2

## 2012-04-07 MED ORDER — MORPHINE SULFATE 2 MG/ML IJ SOLN
1.0000 mg | INTRAMUSCULAR | Status: DC | PRN
  Administered 2012-04-07: 2 mg via INTRAVENOUS
  Filled 2012-04-07: qty 2

## 2012-04-07 MED ORDER — FUROSEMIDE 20 MG PO TABS
20.0000 mg | ORAL_TABLET | Freq: Every day | ORAL | Status: DC
  Administered 2012-04-07 – 2012-04-08 (×2): 20 mg via ORAL
  Filled 2012-04-07 (×2): qty 1

## 2012-04-07 MED ORDER — DIPHENHYDRAMINE HCL 12.5 MG/5ML PO ELIX: 12.5000 mg | ORAL_SOLUTION | Freq: Four times a day (QID) | ORAL | Status: DC | PRN

## 2012-04-07 MED ORDER — METOPROLOL SUCCINATE ER 25 MG PO TB24
25.0000 mg | ORAL_TABLET | Freq: Every day | ORAL | Status: DC
  Administered 2012-04-08: 25 mg via ORAL
  Filled 2012-04-07 (×2): qty 1

## 2012-04-07 MED ORDER — CHLORPROMAZINE HCL 50 MG PO TABS
50.0000 mg | ORAL_TABLET | Freq: Two times a day (BID) | ORAL | Status: DC
  Administered 2012-04-07 – 2012-04-13 (×12): 50 mg via ORAL
  Filled 2012-04-07 (×14): qty 1

## 2012-04-07 MED ORDER — HEPARIN SODIUM (PORCINE) 5000 UNIT/ML IJ SOLN
5000.0000 [IU] | Freq: Three times a day (TID) | INTRAMUSCULAR | Status: DC
  Administered 2012-04-07 – 2012-04-13 (×17): 5000 [IU] via SUBCUTANEOUS
  Filled 2012-04-07 (×20): qty 1

## 2012-04-07 MED ORDER — POTASSIUM CHLORIDE IN NACL 20-0.9 MEQ/L-% IV SOLN
INTRAVENOUS | Status: AC
  Administered 2012-04-07 – 2012-04-08 (×2): via INTRAVENOUS
  Filled 2012-04-07 (×3): qty 1000

## 2012-04-07 MED ORDER — METRONIDAZOLE IN NACL 5-0.79 MG/ML-% IV SOLN
500.0000 mg | Freq: Once | INTRAVENOUS | Status: AC
Start: 2012-04-07 — End: 2012-04-07
  Administered 2012-04-07: 500 mg via INTRAVENOUS
  Filled 2012-04-07: qty 100

## 2012-04-07 MED ORDER — DIPHENHYDRAMINE HCL 50 MG/ML IJ SOLN: 12.5000 mg | Freq: Four times a day (QID) | INTRAMUSCULAR | Status: DC | PRN

## 2012-04-07 MED ORDER — DIVALPROEX SODIUM 250 MG PO DR TAB
250.0000 mg | DELAYED_RELEASE_TABLET | Freq: Two times a day (BID) | ORAL | Status: DC
  Administered 2012-04-07 – 2012-04-13 (×12): 250 mg via ORAL
  Filled 2012-04-07 (×14): qty 1

## 2012-04-07 MED ORDER — PANTOPRAZOLE SODIUM 40 MG IV SOLR
40.0000 mg | Freq: Every day | INTRAVENOUS | Status: DC
Start: 2012-04-07 — End: 2012-04-13
  Administered 2012-04-07 – 2012-04-12 (×6): 40 mg via INTRAVENOUS
  Filled 2012-04-07 (×8): qty 40

## 2012-04-07 MED ORDER — BENZTROPINE MESYLATE 1 MG PO TABS
1.0000 mg | ORAL_TABLET | Freq: Two times a day (BID) | ORAL | Status: DC
  Administered 2012-04-07 – 2012-04-13 (×12): 1 mg via ORAL
  Filled 2012-04-07 (×14): qty 1

## 2012-04-07 MED ORDER — METRONIDAZOLE IN NACL 5-0.79 MG/ML-% IV SOLN: 500.0000 mg | Freq: Once | INTRAVENOUS | Status: DC

## 2012-04-07 MED ORDER — IOHEXOL 300 MG/ML  SOLN
100.0000 mL | Freq: Once | INTRAMUSCULAR | Status: AC | PRN
  Administered 2012-04-07: 100 mL via INTRAVENOUS

## 2012-04-07 MED ORDER — DIVALPROEX SODIUM 125 MG PO DR TAB
125.0000 mg | DELAYED_RELEASE_TABLET | Freq: Two times a day (BID) | ORAL | Status: DC
  Administered 2012-04-07 – 2012-04-13 (×12): 125 mg via ORAL
  Filled 2012-04-07 (×13): qty 1

## 2012-04-07 MED ORDER — CLINDAMYCIN PHOSPHATE 600 MG/50ML IV SOLN
600.0000 mg | Freq: Once | INTRAVENOUS | Status: AC
  Administered 2012-04-07: 600 mg via INTRAVENOUS
  Filled 2012-04-07: qty 50

## 2012-04-07 MED ORDER — ONDANSETRON HCL 4 MG/2ML IJ SOLN: 4.0000 mg | Freq: Four times a day (QID) | INTRAMUSCULAR | Status: DC | PRN

## 2012-04-07 NOTE — ED Notes (Signed)
IWL:[id number]<QJ> Expected date:<BR> Expected time:<BR> Means of arrival:<BR> Comments:<BR> Wound infection

## 2012-04-07 NOTE — ED Provider Notes (Signed)
History     CSN: [id number]  Arrival date & time 04/07/12  0909   First MD Initiated Contact with Patient 04/07/12 [phone]      Chief Complaint  Patient presents with  . Wound Infection    (Consider location/radiation/quality/duration/timing/severity/associated sxs/prior treatment) The history is provided by the patient. The history is limited by the condition of the patient.  Gina [name] is a 61 y.o. female hx of HTN, bipolar, depression, with hernia repair 2 months ago here with syncope and possible abdominal wound infection. She felt lightheaded and dizzy yesterday and passed out. She fell and hit her chin. Denies head injury or neck pain. After she fell, she had worsening abdominal pain. She just moved here from [place] and had the surgery done in [place]. She had intermittent pain after surgery that was worse yesterday. She also had some discharge from the wound but denies fevers. She is suppose to have visiting nurse to come help her but it wasn't set up yet. Denies nausea and vomiting, has been passing gas.    Past Medical History  Diagnosis Date  . Hypertension   . Bipolar 1 disorder   . Depression     Past Surgical History  Procedure Date  . Hernia repair     No family history on file.  History  Substance Use Topics  . Smoking status: Not on file  . Smokeless tobacco: Not on file  . Alcohol Use:     OB History    Grav Para Term Preterm Abortions TAB SAB Ect Mult Living                  Review of Systems  Gastrointestinal: Positive for abdominal pain. Negative for nausea and vomiting.  Neurological: Positive for syncope.  All other systems reviewed and are negative.    Allergies  Penicillins and Tetanus toxoids  Home Medications   Current Outpatient Rx  Name Route Sig Dispense Refill  . BENZTROPINE MESYLATE 1 MG PO TABS Oral Take 1 mg by mouth 2 (two) times daily.    Marland Kitchen CALCIUM CARBONATE 600 MG PO TABS Oral Take 600 mg by mouth 2 (two) times  daily with a meal.    . CHLORPROMAZINE HCL 50 MG PO TABS Oral Take 50 mg by mouth 2 (two) times daily.    . CHOLECALCIFEROL 400 UNITS PO TABS Oral Take 400 Units by mouth daily.    Marland Kitchen DIVALPROEX SODIUM 125 MG PO TBEC Oral Take 125 mg by mouth 2 (two) times daily.    Marland Kitchen DIVALPROEX SODIUM 250 MG PO TBEC Oral Take 250 mg by mouth 2 (two) times daily.    Marland Kitchen FLUOXETINE HCL 40 MG PO CAPS Oral Take 40 mg by mouth daily.    . FUROSEMIDE 20 MG PO TABS Oral Take 20 mg by mouth daily.    Marland Kitchen LOVASTATIN 40 MG PO TABS Oral Take 40 mg by mouth at bedtime.    Marland Kitchen METOPROLOL SUCCINATE ER 25 MG PO TB24 Oral Take 25 mg by mouth daily.      BP 106/66  Pulse 62  Temp 97.8 F (36.6 C) (Oral)  Resp 19  SpO2 97%  Physical Exam  Nursing note and vitals reviewed. Constitutional: No distress.       Chronically ill.   HENT:  Head: Normocephalic.  Mouth/Throat: Oropharynx is clear and moist.       Bruise on chin, no bony tenderness. Poor dentition but bite ok.   Eyes: Conjunctivae and EOM are  normal. Pupils are equal, round, and reactive to light.  Neck: Normal range of motion. Neck supple.  Cardiovascular: Normal rate, regular rhythm, normal heart sounds and intact distal pulses.   Pulmonary/Chest: Effort normal and breath sounds normal.  Abdominal: Soft.         Midline incision with some healing. There is mild purulent drainage associated with some dehiscence of the wound at two sites. Mild diffuse abdominal tenderness. There is a firm area around the wounds.    Musculoskeletal: Normal range of motion.  Neurological:       Confused, disoriented  Skin: Skin is warm and dry.  Psychiatric: She has a normal mood and affect. Her behavior is normal.       Poor judgement     ED Course  Procedures (including critical care time)  I performed an ultrasound guided IV to R antecub. A 20 gauge long angiocath placed. No complications.  Labs Reviewed  CBC WITH DIFFERENTIAL - Abnormal; Notable for the following:     WBC 11.0 (*)     RBC 3.50 (*)     Hemoglobin 10.4 (*)     HCT 31.8 (*)     Neutro Abs 8.0 (*)     Monocytes Absolute 1.3 (*)     All other components within normal limits  COMPREHENSIVE METABOLIC PANEL - Abnormal; Notable for the following:    BUN 26 (*)     Creatinine, Ser 1.17 (*)     Albumin 2.7 (*)     Total Bilirubin 0.2 (*)     GFR calc non Af Amer 49 (*)     GFR calc Af Amer 57 (*)     All other components within normal limits  LIPASE, BLOOD - Abnormal; Notable for the following:    Lipase 9 (*)     All other components within normal limits  TROPONIN I  URINALYSIS, ROUTINE W REFLEX MICROSCOPIC  TROPONIN I   Ct Head Wo Contrast  04/07/2012  *RADIOLOGY REPORT*  Clinical Data:  Status post fall, right orbital/chin bruising/hematoma  CT HEAD WITHOUT CONTRAST CT MAXILLOFACIAL WITHOUT CONTRAST  Technique:  Multidetector CT imaging of the head and maxillofacial structures were performed using the standard protocol without intravenous contrast. Multiplanar CT image reconstructions of the maxillofacial structures were also generated.  Comparison:  None.  CT HEAD  Findings: No evidence of parenchymal hemorrhage or extra-axial fluid collection. No mass lesion, mass effect, or midline shift.  No CT evidence of acute infarction.  Mild global cortical atrophy.  No ventriculomegaly.  Calcifications in the right basal ganglia and cerebellum.  No evidence of calvarial fracture.  IMPRESSION: No evidence of acute intracranial abnormality.  CT MAXILLOFACIAL  Findings:   No evidence of maxillofacial fracture.  The visualized paranasal sinuses are essentially clear. The mastoid air cells are unopacified.  The bilateral orbits, including the retroconal soft tissues, are within normal limits.  Mild soft tissue swelling/bruising overlying the right maxilla (series 4/image 28).  The mandible, including the mandibular condyles, are within normal limits.  Degenerative changes of the cervical spine to C4-5.   IMPRESSION: Mild soft tissue swelling/bruising overlying the right maxilla.  No evidence of maxillofacial fracture.   Original Report Authenticated By: Julian Hy, M.D.    Ct Abdomen Pelvis W Contrast  04/07/2012  *RADIOLOGY REPORT*  Clinical Data: Abdominal pain.  Status post recent hernia repair. Possible wound infection.  CT ABDOMEN AND PELVIS WITH CONTRAST  Technique:  Multidetector CT imaging of the abdomen and pelvis  was performed following the standard protocol during bolus administration of intravenous contrast.  Contrast: 133m OMNIPAQUE IOHEXOL 300 MG/ML  SOLN  Comparison: No priors.  Findings:  Lung Bases: Cardiomegaly.  Otherwise, unremarkable.  Abdomen/Pelvis:  The enhanced appearance of the liver, gallbladder, pancreas, spleen and bilateral adrenal glands is unremarkable. There is mild multifocal parenchymal thinning in the kidneys, most compatible with scarring.  Additionally, there are multiple tiny subcentimeter low attenuation lesions in the kidneys bilaterally which are too small to definitively characterize, but are statistically likely to represent small cysts.  The largest of these measures 9 mm in the upper pole of the right kidney.  There are postoperative changes in the transverse colon, suggesting prior partial colectomy.  The patient is also status post hysterectomy and left salpingo-oophorectomy.  A 2.7 x 1.9 cm partially calcified soft tissue attenuation (48 HU) lesion in the right adnexa is likely ovarian in etiology.  No significant volume of ascites.  No pneumoperitoneum.  No pathologic distension of small bowel.  There are numerous small bowel loops which appear intimately associated with the anterior abdominal wall.  Musculoskeletal: In the subcutaneous fat of the anterior abdominal wall immediately above and around the umbilicus there is a large rim enhancing fluid collection that measures approximately 10.7 x 7.2 x 2.0 cm, concerning for a superficial abscess.  At the  level of the umbilicus, there appears to be a frank defect in the subcutaneous soft tissues of the abdomen which extends to the underlying abdominal wall musculature, and in this region there is pooling of the oral contrast material.  This is indicative of an enterocutaneous fistula (despite the fact that no definite fistulous tract is clearly identified on this examination).  There are no aggressive appearing lytic or blastic lesions noted in the visualized portions of the skeleton.  IMPRESSION: 1.  Findings, as above, compatible with an enterocutaneous fistula, likely from the small bowel, which extends to the body surface at the level of the umbilicus. 2.  In addition, there is a large abscess in the anterior abdominal wall subcutaneous fat immediately above the umbilicus, as above. 3.  2.7 x 1.9 cm soft tissue attenuation lesion in the right adnexa is likely ovarian in etiology.  This could be better evaluated with a non emergent transvaginal ultrasound to exclude neoplasm. 4.  Status post partial colectomy in the mid transverse colon.  The patient is also status post hysterectomy and left oophorectomy. 5.  Additional incidental findings, as above.   Original Report Authenticated By: DEtheleen Mayhew M.D.    Ct Maxillofacial Wo Cm  04/07/2012  *RADIOLOGY REPORT*  Clinical Data:  Status post fall, right orbital/chin bruising/hematoma  CT HEAD WITHOUT CONTRAST CT MAXILLOFACIAL WITHOUT CONTRAST  Technique:  Multidetector CT imaging of the head and maxillofacial structures were performed using the standard protocol without intravenous contrast. Multiplanar CT image reconstructions of the maxillofacial structures were also generated.  Comparison:  None.  CT HEAD  Findings: No evidence of parenchymal hemorrhage or extra-axial fluid collection. No mass lesion, mass effect, or midline shift.  No CT evidence of acute infarction.  Mild global cortical atrophy.  No ventriculomegaly.  Calcifications in the right basal  ganglia and cerebellum.  No evidence of calvarial fracture.  IMPRESSION: No evidence of acute intracranial abnormality.  CT MAXILLOFACIAL  Findings:   No evidence of maxillofacial fracture.  The visualized paranasal sinuses are essentially clear. The mastoid air cells are unopacified.  The bilateral orbits, including the retroconal soft tissues, are within  normal limits.  Mild soft tissue swelling/bruising overlying the right maxilla (series 4/image 28).  The mandible, including the mandibular condyles, are within normal limits.  Degenerative changes of the cervical spine to C4-5.  IMPRESSION: Mild soft tissue swelling/bruising overlying the right maxilla.  No evidence of maxillofacial fracture.   Original Report Authenticated By: Julian Hy, M.D.      No diagnosis found.   Date: 04/07/2012  Rate: 60  Rhythm: normal sinus rhythm  QRS Axis: normal  Intervals: normal  ST/T Wave abnormalities: normal  Conduction Disutrbances:right bundle branch block  Narrative Interpretation:   Old EKG Reviewed: none available    MDM  Muranda Coye is a 61 y.o. female here with abdominal pain and syncope. Will check orthostatics, EKG, and labs to evaluate for syncope. Will do CT head/face. She has cellulitis over the wound. Since she has some tenderness and minimal purulent discharge, will need CT ab/pel to r/o abscess.   1:26 PM Patient's labs showed WBC 11, CMP at baseline. CT ab/pel showed enterocutaneous fistula with abscess. Patient given clinda. I discussed case with surgical PA who will examine the patient.   3:29 PM Surgery at bedside. They will get old records and will admit the patient or put the patient under hospitalist. They will discuss with hospitalist. I ordered flagyl on top of the cipro.      Wandra Arthurs, MD 04/07/12 [phone]

## 2012-04-07 NOTE — Consult Note (Signed)
Pt presented for elective repair of ventral incisional hernia with mesh by Dr Para March in [facility] MD on 7/3. Encountered numerous dense SB adhesions to abd wall and old suture. Over 2hr laparoscopic LOA. Had 20x25 piece of permanent mesh placed. Sent home on POD 1. Bounced back on 7/5 with mild hypotension, SIRS. Ct showed large air-fluid level collection anterior to mesh, thought to be seroma vs hematoma. Treated with abx for "tracheobronchitis" and sent home several days later. Represented around 7/18 with SSI, fever, infection. Admitted to icu. 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 Taken to OR  2 days later for re-exploration, removal of abd mesh, placement of Abthera wound vac. Returned to [facility]. Taken back to OR 2 days later on 7/25 for re-exploration, placement of Allograft bridge b/t fascia edges, partial abd wall closure, and wound vac placement. It was noted that the pt was tolerating a diet throughout the hospitalization and no signs of obstruction on imaging. Noted on last op note - the bowel was covered with granulation tissue and essentially matted together. Eventually sent to SNF for recovery, IV abx of daptomycin and flagyl. Seen in f/u at surgeons office on 8/20, noted to have some skin breakdown around suture site o/w appeared well.  Rest of history per PA Majority of this info per records  Obese, NAD, nontoxic ox3 Obese, soft, midline incision - upper aspect open about 1 in- spont draining some seropurulent fluid; in midportion of incision, skin defect about 1 in with light green spont drainage, no obvious intestine, granulation tissue visible, tracks about 2cm superior. Extensive cellulitis around this area.  EC fistula after prolonged complex hernia repair with multiple return trips to or Obesity OSA on CPAP Bipolar Depressive disorder HTN H/o CRI PCMN  Admit  Bowel rest, npo except essential psych meds Iv  abx Picc/TPN Local wound care to ECF. Pouch ECF. Measure output. This will be a prolonged problem without a quick fix.   Leighton Ruff. [name], MD, FACS General, Bariatric, & Minimally Invasive Surgery [facility], [place]

## 2012-04-07 NOTE — Consult Note (Signed)
WOC consult Note Reason for Consult: Wound consult requested for abd fistula to control drainage. Wound type: Full thickness wound to umbilicus 2X1.5X1cm.  Yellow wound bed, large amt yellow drainage, no odor.  Pt poor historian and unable to state if this is a new development.  Wound is chronic for several months, she states. Upper abd with full thickness wound, .5X1X1cm, dark red drainage, no odor, inner wound bed red. Periwound: Skin surrounding umbilicus is red and denuded from recent drainage. Dressing procedure/placement/frequency: Applied small Eakin pouch to control drainage and allow skin to heal. Pt has significant skin folds and it may be difficult for pouch to adhere.  If large amt drainage causes pouch to leak, medium suction may be applied with suction catheter. Upper wound packed with moist gauze.  Will not plan to follow further unless re-consulted.  [address], RN, MSN, [employer]  [phone]

## 2012-04-07 NOTE — Progress Notes (Signed)
ANTIBIOTIC CONSULT NOTE - INITIAL  Pharmacy Consult for Vancomycin Indication: abdominal wall abscess/cellulitis  Allergies  Allergen Reactions  . Penicillins   . Tetanus Toxoids     Patient Measurements: Height: 5\' 3"  (160 cm) Weight: 168 lb (76.204 kg) IBW/kg (Calculated) : 52.4  Adjusted Body Weight:   Vital Signs: Temp: 97.8 F (36.6 C) (09/04 0933) Temp src: Oral (09/04 0933) BP: 124/53 mmHg (09/04 1500) Pulse Rate: 56  (09/04 1430) Intake/Output from previous day:   Intake/Output from this shift:    Labs:  Basename 04/07/12 1000  WBC 11.0*  HGB 10.4*  PLT PLATELET CLUMPS NOTED ON SMEAR, COUNT APPEARS ADEQUATE  LABCREA --  CREATININE 1.17*   Estimated Creatinine Clearance: 49.3 ml/min (by C-G formula based on Cr of 1.17). No results found for this basename: VANCOTROUGH:2,VANCOPEAK:2,VANCORANDOM:2,GENTTROUGH:2,GENTPEAK:2,GENTRANDOM:2,TOBRATROUGH:2,TOBRAPEAK:2,TOBRARND:2,AMIKACINPEAK:2,AMIKACINTROU:2,AMIKACIN:2, in the last 72 hours   Microbiology: No results found for this or any previous visit (from the past 720 hour(s)).  Medical History: Past Medical History  Diagnosis Date  . Hypertension   . Bipolar 1 disorder   . Depression   . Sleep apnea   . CRI (chronic renal insufficiency)   . Obesity (BMI 30-39.9)   . Colon cancer 04/07/2012  . 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 Sleep apnea 04/07/2012    Medications:  Scheduled:    . benztropine  1 mg Oral BID  . chlorproMAZINE  50 mg Oral BID  . clindamycin (CLEOCIN) IV  300 mg Intravenous Q8H  . clindamycin (CLEOCIN) IV  600 mg Intravenous Once  . divalproex  125 mg Oral BID  . divalproex  250 mg Oral BID  . FLUoxetine  40 mg Oral Daily  . furosemide  20 mg Oral Daily  . heparin  5,000 Units Subcutaneous Q8H  . metoprolol succinate  25 mg Oral Daily  . metronidazole  500 mg Intravenous Once  . metronidazole  500 mg Intravenous Q8H  . pantoprazole (PROTONIX) IV  40 mg Intravenous QHS  . sodium  chloride  1,000 mL Intravenous Once  . DISCONTD: metronidazole  500 mg Intravenous Once   Infusions:    . 0.9 % NaCl with KCl 20 mEq / L     PRN: acetaminophen, acetaminophen, diphenhydrAMINE, diphenhydrAMINE, HYDROcodone-acetaminophen, iohexol, morphine injection, ondansetron Assessment:  61 yo F admitted via ER with abdominal wall abscess and cellulitis (abdominal fistula with drainage)  Has had partial colectomy in '08 due to colon cancer  HTN  Bipolar/depression  Sleep apnea  Chronic renal insufficiency  obesity  Goal of Therapy:   Vancomycin trough level 15-20 mcg/ml  Plan:  Vancomycin 750mg  IV q 12 hours Measure antibiotic drug levels at steady state Follow up culture results  Loletta Specter 04/07/2012,5:00 PM

## 2012-04-07 NOTE — ED Notes (Signed)
Pt belongings locked in cabinet (purse, medications, slippers, and gown)

## 2012-04-07 NOTE — ED Notes (Addendum)
x2 IV attempts. IV team paged.

## 2012-04-07 NOTE — Progress Notes (Signed)
[facility] ED CM noted CM consult CM spoke with pt No family in pt room.  Reviewed CM consult and inquired if previously used home health agency. Pt stated no previous home health agency used.  Provided with a [place] list of home health agencies to assist with choice Pt wants to consult with family members  HOME HEALTH AGENCIES SERVING [place]   Agencies that are Medicare-Certified and are affiliated with [facility]  Telephone Number Address  [facility]   [facility] has ownership interest in this company; however, you are under no obligation to use this agency. [phone] or  [phone] [address] [place], [place] [address] [email or web address]   Agencies that are Medicare-Certified and are not affiliated with [facility] Agency Telephone Number Address  [facility] [phone] Fax [phone] [address] [place], [place]  [address] [email or web address]  [facility] [phone] or [phone] Fax [phone] [address] [place], [place] [address] [email or web address]  [facility] [phone] Fax [phone] [address] [place], [place] [address] [email or web address]  [facility] [phone] Fax [phone] [address] [place], [place]  [address] [email or web address]  [facility] [phone] Fax [phone] [address], [address] [place], [place] [address] [email or web address]  [facility] [phone] [address] [place], [place] [address] [email or web address]  [facility] [phone]  [address] [place], [place]  [address] [email or web address]  [facility] [phone] or [phone] Fax [phone] [address] [place], [place]  [address] [email or web address]  [facility] [phone] Fax [phone] [address] [place], [place]  [address]  [facility]  [phone] Fax [phone] [address] [place], [place] [address] [email or web address]

## 2012-04-07 NOTE — ED Notes (Addendum)
IV team unsuccessful. MD notified.  

## 2012-04-07 NOTE — Consult Note (Signed)
[facility] Medical Consultation  Gina [name] ZOX:[id number] DOB: [birth year] DOA: 04/07/2012 PCP: Eustaquio Boyden, MD   Requesting physician: Dr Aldean Ast Date of consultation: 04/07/12 Reason for consultation: medical management  Impression/Recommendations Principal Problem:  *Enterocutaneous fistula Active Problems:  Colon cancer  Sleep apnea HTN Bipolar d/o Anemia, normocytic 1. Hypertension-continue outpatient medications, monitor and further treat accordingly. EKG reviewed no acute ischemic changes. 2. Bipolar disorder-stable, continue outpatient medications. 3. enterocutaneous fistula/abscess-per surgery/primary team 4. status post fall, likely mechanical- no reports of syncope. 5. anemia, normocytic-likely chronic disease- stable, follow. I  will followup again tomorrow. Please contact me if I can be of assistance in the meanwhile. Thank you for this consultation.   Chief Complaint: s/p fall 2days ago, complaints of pus/bleeding from surgical site`   HPI:  The patient is a 54 year with history of hypertension, bipolar disorder, depression, sleep apnea, renal insufficiency , history of colon cancer-status post partial colectomy in the past, and status post ventral hernia repair in [place] complicated by infection and subsequent removal of infected mesh with followup reexploration presents with with above complaints. It is noted that she recently moved to [place] from [place]. She reports that she fell about 2 days ago and hit her chin was initially seen by EMS but refused transportation to the ED. Subsequently she decided to come to the ED today and workup included a CT maxillofacial which showed no evidence of maxillofacial fracture, CT scan of her head showed arm no evidence of calvarial fracture no acute intracranial abnormality. A CT scan of her abdomen and pelvis was done and that showed findings compatible with enterocutaneous fistula and also in large abscess  in the anterior abdominal wall. Also a 2.7 x 1.9 cm soft tissue attenuation lesions in the right adnexa was noted and patient admitted to the surgical service for further evaluation and management. Internal medicine consulted for medical management. The patient denies chest pain, dizziness, shortness of breath, fevers diarrhea melena and no hematochezia.  Review of Systems:  The patient denies anorexia, fever, weight loss,, vision loss, decreased hearing, hoarseness, chest pain, syncope,  peripheral edema, hemoptysis, melena, hematochezia, severe indigestion/heartburn, hematuria, incontinence, genital sores, muscle weakness, suspicious skin lesions, transient blindness, depression, unusual weight change, abnormal bleeding.   Past Medical History  Diagnosis Date  . Hypertension   . Bipolar 1 disorder   . Depression   . Sleep apnea   . CRI (chronic renal insufficiency)   . Obesity (BMI 30-39.9)   . Colon cancer 04/07/2012  . 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 Sleep apnea 04/07/2012   Past Surgical History  Procedure Date  . Hernia repair 02/04/12  . Partial colectomy about 2008    for colon cancer  . I & d abdominal wound 02/19/12  . Removal of infected mesh and abdominal wound vac placement 02/24/12  . Reexploration of abdominal wound and allograft placemet 02/26/12  . Hysterectomy and left oopherectomy     date unknown   Social History:  reports that she has never smoked. She has never used smokeless tobacco. She reports that she does not drink alcohol or use illicit drugs.  Allergies  Allergen Reactions  . Penicillins   . Tetanus Toxoids    History reviewed. No pertinent family history.  Prior to Admission medications   Medication Sig Start Date End Date Taking? Authorizing Provider  benztropine (COGENTIN) 1 MG tablet Take 1 mg by mouth 2 (two) times daily.   Yes Historical Provider, MD  calcium carbonate (OS-CAL) 600 MG  TABS Take 600 mg by mouth 2 (two) times daily with a meal.    Yes Historical Provider, MD  chlorproMAZINE (THORAZINE) 50 MG tablet Take 50 mg by mouth 2 (two) times daily.   Yes Historical Provider, MD  cholecalciferol (VITAMIN D) 400 UNITS TABS Take 400 Units by mouth daily.   Yes Historical Provider, MD  divalproex (DEPAKOTE) 125 MG DR tablet Take 125 mg by mouth 2 (two) times daily.   Yes Historical Provider, MD  divalproex (DEPAKOTE) 250 MG DR tablet Take 250 mg by mouth 2 (two) times daily.   Yes Historical Provider, MD  FLUoxetine (PROZAC) 40 MG capsule Take 40 mg by mouth daily.   Yes Historical Provider, MD  furosemide (LASIX) 20 MG tablet Take 20 mg by mouth daily.   Yes Historical Provider, MD  lovastatin (MEVACOR) 40 MG tablet Take 40 mg by mouth at bedtime.   Yes Historical Provider, MD  metoprolol succinate (TOPROL-XL) 25 MG 24 hr tablet Take 25 mg by mouth daily.   Yes Historical Provider, MD   Physical Exam: Blood pressure 105/55, pulse 59, temperature 97.8 F (36.6 C), temperature source Oral, resp. rate 17, height 5\' 3"  (1.6 m), weight 76.204 kg (168 lb), SpO2 96.00%. Filed Vitals:   04/07/12 1656 04/07/12 1730 04/07/12 1733 04/07/12 1739  BP:  106/61 97/49 105/55  Pulse:  57 57 59  Temp:      TempSrc:      Resp:  17  17  Height: 5\' 3"  (1.6 m)     Weight: 76.204 kg (168 lb)     SpO2:  97%  96%  Constitutional: Vital signs reviewed.  Morbidly obese elderly female in no acute distress and cooperative with exam. Alert and oriented x3.  Head: Ecchymosis/bruising over right side of chin and right eye Mouth: no erythema or exudates, MMM Eyes: PERRL, EOMI, conjunctivae normal, No scleral icterus.  Neck: Supple, Trachea midline normal ROM, No JVD, mass, thyromegaly, or carotid bruit present.  Cardiovascular: RRR, S1 normal, S2 normal, no MRG, pulses symmetric and intact bilaterally Pulmonary/Chest: Decreased breath sounds at bases, otherwise clear, no wheezes, rales, or rhonchi Abdominal: Soft. obese large midline surgical scar now with  an opening with bag draining brownish discharge, marked surrounding erythema and  non-distended, bowel sounds are normal, no masses, organomegaly, or guarding present.  GU: no CVA tenderness  Extremities: No cyanosis and no edema  Neurological: A&O x3, Strength is normal and symmetric bilaterally, cranial nerve II-XII are grossly intact, no focal motor deficit, sensory intact to light touch bilaterally.  Skin: Warm, dry and intact. No rash, cyanosis, or clubbing.  Psychiatric: Normal mood and affect. speech and behavior is normal.   Labs on Admission:  Basic Metabolic Panel:  Lab 04/07/12 9604 04/07/12 1000  NA -- 137  K -- 4.3  CL -- 99  CO2 -- 25  GLUCOSE -- 86  BUN -- 26*  CREATININE 1.14* 1.17*  CALCIUM -- 9.4  MG -- --  PHOS -- --   Liver Function Tests:  Lab 04/07/12 1000  AST 13  ALT 7  ALKPHOS 67  BILITOT 0.2*  PROT 7.4  ALBUMIN 2.7*    Lab 04/07/12 1000  LIPASE 9*  AMYLASE --   No results found for this basename: AMMONIA:5 in the last 168 hours CBC:  Lab 04/07/12 1640 04/07/12 1000  WBC 10.2 11.0*  NEUTROABS -- 8.0*  HGB 9.2* 10.4*  HCT 28.5* 31.8*  MCV 90.5 90.9  PLT 255 PLATELET CLUMPS  NOTED ON SMEAR, COUNT APPEARS ADEQUATE   Cardiac Enzymes:  Lab 04/07/12 1300 04/07/12 1000  CKTOTAL -- --  CKMB -- --  CKMBINDEX -- --  TROPONINI <0.30 <0.30   BNP: No components found with this basename: POCBNP:5 CBG: No results found for this basename: GLUCAP:5 in the last 168 hours  Radiological Exams on Admission: Dg Chest 2 View  04/07/2012  *RADIOLOGY REPORT*  Clinical Data: Weakness.  CHEST - 2 VIEW  Comparison: None.  Findings: Lung volumes are low. The cardiopericardial silhouette is enlarged.  No focal airspace consolidation or airspace pulmonary edema. Imaged bony structures of the thorax are intact. Telemetry leads overlie the chest.  IMPRESSION: Low volume film with cardiomegaly.  No acute cardiopulmonary findings.   Original Report Authenticated By:  ERIC A. MANSELL, M.D.    Ct Head Wo Contrast  04/07/2012  *RADIOLOGY REPORT*  Clinical Data:  Status post fall, right orbital/chin bruising/hematoma  CT HEAD WITHOUT CONTRAST CT MAXILLOFACIAL WITHOUT CONTRAST  Technique:  Multidetector CT imaging of the head and maxillofacial structures were performed using the standard protocol without intravenous contrast. Multiplanar CT image reconstructions of the maxillofacial structures were also generated.  Comparison:  None.  CT HEAD  Findings: No evidence of parenchymal hemorrhage or extra-axial fluid collection. No mass lesion, mass effect, or midline shift.  No CT evidence of acute infarction.  Mild global cortical atrophy.  No ventriculomegaly.  Calcifications in the right basal ganglia and cerebellum.  No evidence of calvarial fracture.  IMPRESSION: No evidence of acute intracranial abnormality.  CT MAXILLOFACIAL  Findings:   No evidence of maxillofacial fracture.  The visualized paranasal sinuses are essentially clear. The mastoid air cells are unopacified.  The bilateral orbits, including the retroconal soft tissues, are within normal limits.  Mild soft tissue swelling/bruising overlying the right maxilla (series 4/image 28).  The mandible, including the mandibular condyles, are within normal limits.  Degenerative changes of the cervical spine to C4-5.  IMPRESSION: Mild soft tissue swelling/bruising overlying the right maxilla.  No evidence of maxillofacial fracture.   Original Report Authenticated By: Charline Bills, M.D.    Ct Abdomen Pelvis W Contrast  04/07/2012  *RADIOLOGY REPORT*  Clinical Data: Abdominal pain.  Status post recent hernia repair. Possible wound infection.  CT ABDOMEN AND PELVIS WITH CONTRAST  Technique:  Multidetector CT imaging of the abdomen and pelvis was performed following the standard protocol during bolus administration of intravenous contrast.  Contrast: OMNIPAQUE IOHEXOL 300 MG/ML  SOLN  Comparison: No priors.  Findings:   Lung Bases: Cardiomegaly.  Otherwise, unremarkable.  Abdomen/Pelvis:  The enhanced appearance of the liver, gallbladder, pancreas, spleen and bilateral adrenal glands is unremarkable. There is mild multifocal parenchymal thinning in the kidneys, most compatible with scarring.  Additionally, there are multiple tiny subcentimeter low attenuation lesions in the kidneys bilaterally which are too small to definitively characterize, but are statistically likely to represent small cysts.  The largest of these measures 9 mm in the upper pole of the right kidney.  There are postoperative changes in the transverse colon, suggesting prior partial colectomy.  The patient is also status post hysterectomy and left salpingo-oophorectomy.  A 2.7 x 1.9 cm partially calcified soft tissue attenuation (48 HU) lesion in the right adnexa is likely ovarian in etiology.  No significant volume of ascites.  No pneumoperitoneum.  No pathologic distension of small bowel.  There are numerous small bowel loops which appear intimately associated with the anterior abdominal wall.  Musculoskeletal: In the subcutaneous  fat of the anterior abdominal wall immediately above and around the umbilicus there is a large rim enhancing fluid collection that measures approximately 10.7 x 7.2 x 2.0 cm, concerning for a superficial abscess.  At the level of the umbilicus, there appears to be a frank defect in the subcutaneous soft tissues of the abdomen which extends to the underlying abdominal wall musculature, and in this region there is pooling of the oral contrast material.  This is indicative of an enterocutaneous fistula (despite the fact that no definite fistulous tract is clearly identified on this examination).  There are no aggressive appearing lytic or blastic lesions noted in the visualized portions of the skeleton.  IMPRESSION: 1.  Findings, as above, compatible with an enterocutaneous fistula, likely from the small bowel, which extends to the body  surface at the level of the umbilicus. 2.  In addition, there is a large abscess in the anterior abdominal wall subcutaneous fat immediately above the umbilicus, as above. 3.  2.7 x 1.9 cm soft tissue attenuation lesion in the right adnexa is likely ovarian in etiology.  This could be better evaluated with a non emergent transvaginal ultrasound to exclude neoplasm. 4.  Status post partial colectomy in the mid transverse colon.  The patient is also status post hysterectomy and left oophorectomy. 5.  Additional incidental findings, as above.   Original Report Authenticated By: Florencia Reasons, M.D.    Ct Maxillofacial Wo Cm  04/07/2012  *RADIOLOGY REPORT*  Clinical Data:  Status post fall, right orbital/chin bruising/hematoma  CT HEAD WITHOUT CONTRAST CT MAXILLOFACIAL WITHOUT CONTRAST  Technique:  Multidetector CT imaging of the head and maxillofacial structures were performed using the standard protocol without intravenous contrast. Multiplanar CT image reconstructions of the maxillofacial structures were also generated.  Comparison:  None.  CT HEAD  Findings: No evidence of parenchymal hemorrhage or extra-axial fluid collection. No mass lesion, mass effect, or midline shift.  No CT evidence of acute infarction.  Mild global cortical atrophy.  No ventriculomegaly.  Calcifications in the right basal ganglia and cerebellum.  No evidence of calvarial fracture.  IMPRESSION: No evidence of acute intracranial abnormality.  CT MAXILLOFACIAL  Findings:   No evidence of maxillofacial fracture.  The visualized paranasal sinuses are essentially clear. The mastoid air cells are unopacified.  The bilateral orbits, including the retroconal soft tissues, are within normal limits.  Mild soft tissue swelling/bruising overlying the right maxilla (series 4/image 28).  The mandible, including the mandibular condyles, are within normal limits.  Degenerative changes of the cervical spine to C4-5.  IMPRESSION: Mild soft tissue  swelling/bruising overlying the right maxilla.  No evidence of maxillofacial fracture.   Original Report Authenticated By: Charline Bills, M.D.      Time spent: >71mins  Kela Millin [facility] Pager [phone]  If 7PM-7AM, please contact night-coverage [email or web address] Password [facility] 04/07/2012, 6:19 PM

## 2012-04-07 NOTE — Telephone Encounter (Signed)
Attempted to call patient. No answer and mailbox was not set up to leave voicemail. Other # was disconnected. Will try again later.

## 2012-04-07 NOTE — ED Notes (Signed)
PA from general surgery at bedside.

## 2012-04-07 NOTE — Consult Note (Signed)
Reason for Consult: EC fistula Referring Physician: *Dr. Oneida Arenas Braddy is an 61 y.o. female.  HPI: Patient is a 61 year old female who thoroughly moved from [place] to [place] on 04/03/12. She is bipolar with history of depression on a large amount of medicine normally. She has a history of a partial colectomy for a colon cancer approximately 2008. No other is exactly sure when this occurred. She was seen in [place] at [facility] on 02/04/2012 at which time she underwent lysis of adhesions and ventral hernia repair with mesh. She was apparently discharged and returned the following day with SIRS, hypotension, and acute renal insufficiency. On 02/19/12 she underwent incision and drainage were abdominal wound. She then returned to the OR on 02/24/2012 with removal of infected mesh and placement of an open abdominal wound VAC system. On 02/26/2012 she underwent re-exploration of her abdominal wound washout and allograft placement of the open abdominal wall. She was then discharged to a skilled nursing facility. 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 Patient presents today after having a fall at home. She apparently had a syncopal episode.. she was seen by EMS and initially refused transport. She is living with her sister, who is also here and being looked after by a niece and nephew. She finally presented to the ER for evaluation after her fall, at which time it was noted she had fluid draining from her mid abdominal incision. She had to open areas along her midline surgical scar. She has marked erythema and cellulitis of the abdominal wall. She cannot tell us how long she's had the open abdominal wound or the draining fluid from her abdominal wound. CT of the head and maxillofacial CTs are normal except for some soft tissue swelling and bruising over the right maxilla. 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 There is a large abscess the anterior abdominal wall which is open to the skin. There is a 2.7 x 1.9 soft tissue right adnexal lesion which may be a possible neoplasm. CT also shows a partial colectomy in the mid transverse colon. It also showed she is status post hysterectomy and left of the rectum he. We were asked to see in consultation.   Past Medical History  Diagnosis Date  . Hypertension   . Bipolar 1 disorder   . Depression   . Sleep apnea   . CRI (chronic renal insufficiency)   . Obesity (BMI 30-39.9)     Past Surgical History  Procedure Date  .  laparoscopic repair of incarcerated hernia with 25 polypropylene carotid mesh. Enterolysis  02/04/12  . Partial colectomy about 2008    for colon cancer  .  decompression of abdominal wall process with irrigation culture and drainage.  02/19/12  .  exploration of abdominal wall, Removal of infected mesh and abdominal wound vac placement 02/24/12  . Reexploration of abdominal wound and allograft placement, partial closure of skin and subcutaneous tissue and application of wound VAC negative pressure dressing.  02/26/12  . Hysterectomy and left oopherectomy     date unknown    No family history on file.  Social History:  does not have a smoking history on file. She does not have any smokeless tobacco history on file. Her alcohol and drug histories not on file.  Allergies:  Allergies  Allergen Reactions  . Penicillins   . Tetanus Toxoids  Prior to Admission medications   Medication Sig Start Date End Date Taking? Authorizing Provider  benztropine (COGENTIN) 1 MG tablet Take 1 mg by mouth 2 (two) times daily.   Yes Historical Provider, MD  calcium carbonate (OS-CAL) 600 MG TABS Take 600 mg by mouth 2 (two) times daily with a meal.   Yes Historical Provider, MD  chlorproMAZINE (THORAZINE) 50 MG tablet Take 50 mg by mouth 2 (two) times daily.   Yes Historical Provider, MD  cholecalciferol  (VITAMIN D) 400 UNITS TABS Take 400 Units by mouth daily.   Yes Historical Provider, MD  divalproex (DEPAKOTE) 125 MG DR tablet Take 125 mg by mouth 2 (two) times daily.   Yes Historical Provider, MD  divalproex (DEPAKOTE) 250 MG DR tablet Take 250 mg by mouth 2 (two) times daily.   Yes Historical Provider, MD  FLUoxetine (PROZAC) 40 MG capsule Take 40 mg by mouth daily.   Yes Historical Provider, MD  furosemide (LASIX) 20 MG tablet Take 20 mg by mouth daily.   Yes Historical Provider, MD  lovastatin (MEVACOR) 40 MG tablet Take 40 mg by mouth at bedtime.   Yes Historical Provider, MD  metoprolol succinate (TOPROL-XL) 25 MG 24 hr tablet Take 25 mg by mouth daily.   Yes Historical Provider, MD    Medications:  Prior to Admission:  (Not in a hospital admission) Scheduled:   . clindamycin (CLEOCIN) IV  600 mg Intravenous Once  . metronidazole  500 mg Intravenous Once  . sodium chloride  1,000 mL Intravenous Once  . DISCONTD: metronidazole  500 mg Intravenous Once   Continuous:  FAO:[id number] Anti-infectives     Start     Dose/Rate Route Frequency Ordered Stop   04/07/12 1545   metroNIDAZOLE (FLAGYL) IVPB 500 mg  Status:  Discontinued        500 mg 100 mL/hr over 60 Minutes Intravenous  Once 04/07/12 1539 04/07/12 1541   04/07/12 1530   metroNIDAZOLE (FLAGYL) IVPB 500 mg        500 mg 100 mL/hr over 60 Minutes Intravenous  Once 04/07/12 1519     04/07/12 1000   clindamycin (CLEOCIN) IVPB 600 mg        600 mg 100 mL/hr over 30 Minutes Intravenous  Once 04/07/12 0948 04/07/12 1245          Results for orders placed during the hospital encounter of 04/07/12 (from the past 48 hour(s))  CBC WITH DIFFERENTIAL     Status: Abnormal   Collection Time   04/07/12 10:00 AM      Component Value Range Comment   WBC 11.0 (*) 4.0 - 10.5 K/uL    RBC 3.50 (*) 3.87 - 5.11 MIL/uL    Hemoglobin 10.4 (*) 12.0 - 15.0 g/dL    HCT 31.8 (*) 36.0 - 46.0 %    MCV 90.9  78.0 - 100.0 fL    MCH 29.7  26.0  - 34.0 pg    MCHC 32.7  30.0 - 36.0 g/dL    RDW 14.2  11.5 - 15.5 %    Platelets    150 - 400 K/uL    Value: PLATELET CLUMPS NOTED ON SMEAR, COUNT APPEARS ADEQUATE   Neutrophils Relative 72  43 - 77 %    Lymphocytes Relative 14  12 - 46 %    Monocytes Relative 12  3 - 12 %    Eosinophils Relative 2  0 - 5 %    Basophils Relative 0  0 -  1 %    Neutro Abs 8.0 (*) 1.7 - 7.7 K/uL    Lymphs Abs 1.5  0.7 - 4.0 K/uL    Monocytes Absolute 1.3 (*) 0.1 - 1.0 K/uL    Eosinophils Absolute 0.2  0.0 - 0.7 K/uL    Basophils Absolute 0.0  0.0 - 0.1 K/uL    WBC Morphology WHITE COUNT CONFIRMED ON SMEAR      Smear Review     COUNT MAY BE INACCURATE DUE TO FIBRIN CLUMPS.   Value: PLATELET CLUMPS NOTED ON SMEAR, COUNT APPEARS ADEQUATE  COMPREHENSIVE METABOLIC PANEL     Status: Abnormal   Collection Time   04/07/12 10:00 AM      Component Value Range Comment   Sodium 137  135 - 145 mEq/L    Potassium 4.3  3.5 - 5.1 mEq/L    Chloride 99  96 - 112 mEq/L    CO2 25  19 - 32 mEq/L    Glucose, Bld 86  70 - 99 mg/dL    BUN 26 (*) 6 - 23 mg/dL    Creatinine, Ser 1.17 (*) 0.50 - 1.10 mg/dL    Calcium 9.4  8.4 - 10.5 mg/dL    Total Protein 7.4  6.0 - 8.3 g/dL    Albumin 2.7 (*) 3.5 - 5.2 g/dL    AST 13  0 - 37 U/L    ALT 7  0 - 35 U/L    Alkaline Phosphatase 67  39 - 117 U/L    Total Bilirubin 0.2 (*) 0.3 - 1.2 mg/dL    GFR calc non Af Amer 49 (*) >90 mL/min    GFR calc Af Amer 57 (*) >90 mL/min   LIPASE, BLOOD     Status: Abnormal   Collection Time   04/07/12 10:00 AM      Component Value Range Comment   Lipase 9 (*) 11 - 59 U/L   TROPONIN I     Status: Normal   Collection Time   04/07/12 10:00 AM      Component Value Range Comment   Troponin I <0.30  <0.30 ng/mL   URINALYSIS, ROUTINE W REFLEX MICROSCOPIC     Status: Normal   Collection Time   04/07/12 11:04 AM      Component Value Range Comment   Color, Urine YELLOW  YELLOW    APPearance CLEAR  CLEAR    Specific Gravity, Urine 1.011  1.005 - 1.030      pH 6.5  5.0 - 8.0    Glucose, UA NEGATIVE  NEGATIVE mg/dL    Hgb urine dipstick NEGATIVE  NEGATIVE    Bilirubin Urine NEGATIVE  NEGATIVE    Ketones, ur NEGATIVE  NEGATIVE mg/dL    Protein, ur NEGATIVE  NEGATIVE mg/dL    Urobilinogen, UA 0.2  0.0 - 1.0 mg/dL    Nitrite NEGATIVE  NEGATIVE    Leukocytes, UA NEGATIVE  NEGATIVE MICROSCOPIC NOT DONE ON URINES WITH NEGATIVE PROTEIN, BLOOD, LEUKOCYTES, NITRITE, OR GLUCOSE <1000 mg/dL.  TROPONIN I     Status: Normal   Collection Time   04/07/12  1:00 PM      Component Value Range Comment   Troponin I <0.30  <0.30 ng/mL     Ct Head Wo Contrast  04/07/2012  *RADIOLOGY REPORT*  Clinical Data:  Status post fall, right orbital/chin bruising/hematoma  CT HEAD WITHOUT CONTRAST CT MAXILLOFACIAL WITHOUT CONTRAST  Technique:  Multidetector CT imaging of the head and maxillofacial structures were performed using  the standard protocol without intravenous contrast. Multiplanar CT image reconstructions of the maxillofacial structures were also generated.  Comparison:  None.  CT HEAD  Findings: No evidence of parenchymal hemorrhage or extra-axial fluid collection. No mass lesion, mass effect, or midline shift.  No CT evidence of acute infarction.  Mild global cortical atrophy.  No ventriculomegaly.  Calcifications in the right basal ganglia and cerebellum.  No evidence of calvarial fracture.  IMPRESSION: No evidence of acute intracranial abnormality.  CT MAXILLOFACIAL  Findings:   No evidence of maxillofacial fracture.  The visualized paranasal sinuses are essentially clear. The mastoid air cells are unopacified.  The bilateral orbits, including the retroconal soft tissues, are within normal limits.  Mild soft tissue swelling/bruising overlying the right maxilla (series 4/image 28).  The mandible, including the mandibular condyles, are within normal limits.  Degenerative changes of the cervical spine to C4-5.  IMPRESSION: Mild soft tissue swelling/bruising overlying the  right maxilla.  No evidence of maxillofacial fracture.   Original Report Authenticated By: Julian Hy, M.D.    Ct Abdomen Pelvis W Contrast  04/07/2012  *RADIOLOGY REPORT*  Clinical Data: Abdominal pain.  Status post recent hernia repair. Possible wound infection.  CT ABDOMEN AND PELVIS WITH CONTRAST  Technique:  Multidetector CT imaging of the abdomen and pelvis was performed following the standard protocol during bolus administration of intravenous contrast.  Contrast: 174m OMNIPAQUE IOHEXOL 300 MG/ML  SOLN  Comparison: No priors.  Findings:  Lung Bases: Cardiomegaly.  Otherwise, unremarkable.  Abdomen/Pelvis:  The enhanced appearance of the liver, gallbladder, pancreas, spleen and bilateral adrenal glands is unremarkable. There is mild multifocal parenchymal thinning in the kidneys, most compatible with scarring.  Additionally, there are multiple tiny subcentimeter low attenuation lesions in the kidneys bilaterally which are too small to definitively characterize, but are statistically likely to represent small cysts.  The largest of these measures 9 mm in the upper pole of the right kidney.  There are postoperative changes in the transverse colon, suggesting prior partial colectomy.  The patient is also status post hysterectomy and left salpingo-oophorectomy.  A 2.7 x 1.9 cm partially calcified soft tissue attenuation (48 HU) lesion in the right adnexa is likely ovarian in etiology.  No significant volume of ascites.  No pneumoperitoneum.  No pathologic distension of small bowel.  There are numerous small bowel loops which appear intimately associated with the anterior abdominal wall.  Musculoskeletal: In the subcutaneous fat of the anterior abdominal wall immediately above and around the umbilicus there is a large rim enhancing fluid collection that measures approximately 10.7 x 7.2 x 2.0 cm, concerning for a superficial abscess.  At the level of the umbilicus, there appears to be a frank defect in the  subcutaneous soft tissues of the abdomen which extends to the underlying abdominal wall musculature, and in this region there is pooling of the oral contrast material.  This is indicative of an enterocutaneous fistula (despite the fact that no definite fistulous tract is clearly identified on this examination).  There are no aggressive appearing lytic or blastic lesions noted in the visualized portions of the skeleton.  IMPRESSION: 1.  Findings, as above, compatible with an enterocutaneous fistula, likely from the small bowel, which extends to the body surface at the level of the umbilicus. 2.  In addition, there is a large abscess in the anterior abdominal wall subcutaneous fat immediately above the umbilicus, as above. 3.  2.7 x 1.9 cm soft tissue attenuation lesion in the right adnexa is  likely ovarian in etiology.  This could be better evaluated with a non emergent transvaginal ultrasound to exclude neoplasm. 4.  Status post partial colectomy in the mid transverse colon.  The patient is also status post hysterectomy and left oophorectomy. 5.  Additional incidental findings, as above.   Original Report Authenticated By: Etheleen Mayhew, M.D.    Ct Maxillofacial Wo Cm  04/07/2012  *RADIOLOGY REPORT*  Clinical Data:  Status post fall, right orbital/chin bruising/hematoma  CT HEAD WITHOUT CONTRAST CT MAXILLOFACIAL WITHOUT CONTRAST  Technique:  Multidetector CT imaging of the head and maxillofacial structures were performed using the standard protocol without intravenous contrast. Multiplanar CT image reconstructions of the maxillofacial structures were also generated.  Comparison:  None.  CT HEAD  Findings: No evidence of parenchymal hemorrhage or extra-axial fluid collection. No mass lesion, mass effect, or midline shift.  No CT evidence of acute infarction.  Mild global cortical atrophy.  No ventriculomegaly.  Calcifications in the right basal ganglia and cerebellum.  No evidence of calvarial fracture.   IMPRESSION: No evidence of acute intracranial abnormality.  CT MAXILLOFACIAL  Findings:   No evidence of maxillofacial fracture.  The visualized paranasal sinuses are essentially clear. The mastoid air cells are unopacified.  The bilateral orbits, including the retroconal soft tissues, are within normal limits.  Mild soft tissue swelling/bruising overlying the right maxilla (series 4/image 28).  The mandible, including the mandibular condyles, are within normal limits.  Degenerative changes of the cervical spine to C4-5.  IMPRESSION: Mild soft tissue swelling/bruising overlying the right maxilla.  No evidence of maxillofacial fracture.   Original Report Authenticated By: Julian Hy, M.D.     Review of Systems  Constitutional: Negative for fever, chills and weight loss.       Pt is bipolar and has difficulty remembering many details, and frequently cannot answer questions.  HENT: Negative.  Negative for neck pain.   Eyes: Negative.   Respiratory:       She has sleep apnea and uses a CPAP machine.  Cardiovascular: Negative.   Gastrointestinal: Positive for heartburn (occasional ) and constipation (occasional). Negative for nausea, vomiting, abdominal pain, diarrhea, blood in stool and melena.  Genitourinary: Positive for urgency and frequency.  Musculoskeletal: Negative for back pain, joint pain and falls.       Has trouble walking  Skin:       She's not sure how long abdomen has been red.  Neurological: Positive for dizziness and loss of consciousness (passed out at home.). Negative for tingling, tremors, sensory change, speech change, focal weakness and seizures.  Endo/Heme/Allergies: Negative.   Psychiatric/Behavioral: Positive for depression. Negative for suicidal ideas, hallucinations, memory loss and substance abuse. The patient is nervous/anxious. The patient does not have insomnia.        Family says she's been confused since moving down here 04/03/12.   Blood pressure 124/53, pulse  56, temperature 97.8 F (36.6 C), temperature source Oral, resp. rate 21, SpO2 99.00%. Physical Exam  Constitutional: She is oriented to person, place, and time. She appears well-developed. No distress.       Obese WF, NAD  HENT:  Head: Normocephalic and atraumatic.       MULTIPLE TEETH MISSING. Ecchymosis right eye Bruising right chin  Eyes: Conjunctivae and EOM are normal. Pupils are equal, round, and reactive to light. Right eye exhibits no discharge. Left eye exhibits no discharge. No scleral icterus.  Neck: Normal range of motion. Neck supple. No JVD present. No tracheal deviation present.  No thyromegaly present.  Cardiovascular: Normal rate, regular rhythm, normal heart sounds and intact distal pulses.  Exam reveals no gallop.   No murmur heard. Respiratory: Effort normal and breath sounds normal. No stridor. No respiratory distress. She has no wheezes. She has no rales. She exhibits no tenderness.  GI: Soft. Bowel sounds are normal. She exhibits no distension (minimal tenderness) and no mass. There is tenderness. There is no rebound and no guarding.       Large midline surgical scar.  Open 1.5 x 2.0 cm area below xyphoid, purulent necrotic fat appearing material.3  2nd open area 3 x 2 cm mid line 4-5 cm below the 1st open area surrounded by 10 cm of erythema.  Draining green colored intestinal fluid.  This is a large amount of drainage.  Genitourinary:       No skin break down on back, or  Buttocks. No bruising on her back or side noted.  She does have some external hemorrhoids.  Musculoskeletal: She exhibits no edema.  Lymphadenopathy:    She has no cervical adenopathy.  Neurological: She is alert and oriented to person, place, and time. No cranial nerve deficit.  Skin: Skin is warm and dry. No rash noted. She is not diaphoretic. No erythema.       Skin changes from bruising right face and chin. Erythema from her abdominal wound.  Psychiatric: She has a normal mood and affect.  Her behavior is normal.       She has a very poor recollection of her history and cannot really tell me even how long she has had fluid draining from her abdomen.    Assessment/Plan: 1. Status post partial colectomy with ventral hernia.. 2. Status post ventral hernia repair with mesh, 02/04/12. 3. Status post multiple surgeries as noted above with removal of infected mesh, nonhealing abdominal wound, now with enterocutaneous fistula. 4. Bipolar disorder; lives with her sister. 5. Hypertension 6. Sleep apnea with CPAP 7. History of renal insufficiency 8. Obesity Plan: We'll admit the patient, she will need a PICC line in, start TNA, start antibiotics, and medical management at this time. We will keep  Her on her oral bipolar meds for the time being. Medicine will see in consultation to help with her bipolar disease and medical issues.  Psychiatrist: Dr. Renaee Munda, [place] [phone] Surgeon: Dr. Shirlean Kelly Debera Lat, MD  Earnstine Regal 04/07/2012, 3:21 PM

## 2012-04-07 NOTE — Telephone Encounter (Signed)
Tried again-still no answer, no machine. Will try again later.

## 2012-04-07 NOTE — ED Notes (Signed)
Per EMS-Pt just moved here from [place], reports having hernia surgery there in July. Since, wound is red and edematous, pt c/o of bleeding and pus coming from the site. States that she also fell x2 days ago and hit chin on object. States that home health was suppose to come clean the wound this morning but never came.

## 2012-04-08 DIAGNOSIS — E46 Unspecified protein-calorie malnutrition: Secondary | ICD-10-CM

## 2012-04-08 DIAGNOSIS — G4730 Sleep apnea, unspecified: Secondary | ICD-10-CM

## 2012-04-08 LAB — BASIC METABOLIC PANEL
BUN: 23 mg/dL (ref 6–23)
Calcium: 8.4 mg/dL (ref 8.4–10.5)
Chloride: 101 mEq/L (ref 96–112)
Creatinine, Ser: 1.28 mg/dL — ABNORMAL HIGH (ref 0.50–1.10)
GFR calc Af Amer: 51 mL/min — ABNORMAL LOW (ref >90)
GFR calc non Af Amer: 44 mL/min — ABNORMAL LOW (ref >90)
Glucose, Bld: 89 mg/dL (ref 70–99)
Potassium: 4.4 mEq/L (ref 3.5–5.1)

## 2012-04-08 LAB — CBC
HCT: 28.8 % — ABNORMAL LOW (ref 36.0–46.0)
MCHC: 31.9 g/dL (ref 30.0–36.0)
Platelets: 246 10*3/uL (ref 150–400)
RDW: 14.4 % (ref 11.5–15.5)

## 2012-04-08 LAB — CK: Total CK: 37 U/L (ref 7–177)

## 2012-04-08 LAB — MAGNESIUM: Magnesium: 2.1 mg/dL (ref 1.5–2.5)

## 2012-04-08 MED ORDER — POTASSIUM CHLORIDE IN NACL 20-0.9 MEQ/L-% IV SOLN
INTRAVENOUS | Status: DC
  Administered 2012-04-09: 15:00:00 via INTRAVENOUS
  Administered 2012-04-10 – 2012-04-11 (×2): 60 mL/h via INTRAVENOUS
  Administered 2012-04-12 – 2012-04-13 (×2): via INTRAVENOUS
  Filled 2012-04-08 (×8): qty 1000

## 2012-04-08 MED ORDER — METOPROLOL SUCCINATE 12.5 MG HALF TABLET
12.5000 mg | ORAL_TABLET | Freq: Every day | ORAL | Status: DC
  Administered 2012-04-09 – 2012-04-12 (×3): 12.5 mg via ORAL
  Filled 2012-04-08 (×5): qty 1

## 2012-04-08 MED ORDER — SODIUM CHLORIDE 0.9 % IJ SOLN
10.0000 mL | INTRAMUSCULAR | Status: DC | PRN
  Administered 2012-04-08 – 2012-04-12 (×3): 10 mL

## 2012-04-08 MED ORDER — SODIUM CHLORIDE 0.9 % IV SOLN
6.0000 mg/kg | INTRAVENOUS | Status: DC
  Administered 2012-04-08 – 2012-04-12 (×5): 457 mg via INTRAVENOUS
  Filled 2012-04-08 (×7): qty 9.14

## 2012-04-08 MED ORDER — DEXTROSE 5 % IV SOLN
1.0000 g | Freq: Three times a day (TID) | INTRAVENOUS | Status: DC
  Administered 2012-04-08 – 2012-04-13 (×14): 1 g via INTRAVENOUS
  Filled 2012-04-08 (×16): qty 1

## 2012-04-08 MED ORDER — CLINIMIX E/DEXTROSE (5/20) 5 % IV SOLN
INTRAVENOUS | Status: AC
  Administered 2012-04-08: 19:00:00 via INTRAVENOUS
  Filled 2012-04-08: qty 1000

## 2012-04-08 MED ORDER — FUROSEMIDE 20 MG PO TABS: 20.0000 mg | ORAL_TABLET | ORAL | Status: DC

## 2012-04-08 MED ORDER — METOPROLOL SUCCINATE 12.5 MG HALF TABLET: 12.5000 mg | ORAL_TABLET | Freq: Every day | ORAL | Status: DC

## 2012-04-08 NOTE — Care Management Note (Addendum)
    Page 1 of 2   04/12/2012     1:47:42 PM   CARE MANAGEMENT NOTE 04/12/2012  Patient:  Gina [name], Gina [name]   Account Number:  [id number]  Date Initiated:  04/08/2012  Documentation initiated by:  Lorenda Ishihara  Subjective/Objective Assessment:   61 yo female admitted with AMS, wound infection/dehis, malnutrition. PTA lived at home with family. Recently moved here from [place].     Action/Plan:   Assess for need for SNF vs home   Anticipated DC Date:  04/12/2012   Anticipated DC Plan:  LONG TERM ACUTE CARE (LTAC)  In-house referral  Clinical Social Worker      DC Planning Services  CM consult      Choice offered to / List presented to:             Status of service:  Completed, signed off Medicare Important Message given?   (If response is "NO", the following Medicare IM given date fields will be blank) Date Medicare IM given:   Date Additional Medicare IM given:    Discharge Disposition:  LONG TERM ACUTE CARE (LTAC)  Per UR Regulation:  Reviewed for med. necessity/level of care/duration of stay  If discussed at Long Length of Stay Meetings, dates discussed:    Comments:  04-12-12 Lorenda Ishihara RN CM 1345 Recieved a referral for LTAC. Spoke with nephew, he wants to do whats best for patient that will eventually get her back home. Assured him this was our goal as well but at this time she still requires a great deal of care. He was agreeable to [facility], preferred close to hospital, agreed to evaluation by [facility]. Contacted Boneta Lucks with [facility] to review for admission.  04-09-12 Lorenda Ishihara RN CM 1520 Per CSW note, patient to d/c to SNF. Patient is pleasantly confused, family spoke with CSW.  04-08-12 Lorenda Ishihara RN CM 1234 Recieved a call from [facility] [place] [facility] stating patient was active with them but had not established with a PCP yet so start of care was pending. Will f/u with family on [facility] vs SNF.

## 2012-04-08 NOTE — Progress Notes (Signed)
Need strict output monitoring of ec fistula  Start daptomycin, azactam based on outside cultures Cont flagyl D/c vanc/clinda F/u cultures from wound Will monitor subcu fluid collection for now - may need i&d Picc/tpn Npo  Leighton Ruff. [name], MD, FACS General, Bariatric, & Minimally Invasive Surgery [facility], [place]

## 2012-04-08 NOTE — Progress Notes (Signed)
PARENTERAL NUTRITION CONSULT NOTE - INITIAL  Pharmacy Consult for TNA Indication: Enterocutaneous fistula   Allergies  Allergen Reactions  . Penicillins   . Tetanus Toxoids     Patient Measurements: Height: 5\' 3"  (160 cm) Weight: 168 lb (76.204 kg) IBW/kg (Calculated) : 52.4  Adjusted Body Weight: 61.9   Vital Signs: Temp: 97.3 F (36.3 C) (09/05 0651) Temp src: Axillary (09/05 0651) BP: 102/62 mmHg (09/05 0651) Pulse Rate: 60  (09/05 0651) Intake/Output from previous day: 09/04 0701 - 09/05 0700 In: -  Out: 1240 [Urine:1220] Intake/Output from this shift:    Labs:  Basename 04/08/12 0510 04/07/12 1640 04/07/12 1000  WBC 8.5 10.2 11.0*  HGB 9.2* 9.2* 10.4*  HCT 28.8* 28.5* 31.8*  PLT 246 255 PLATELET CLUMPS NOTED ON SMEAR, COUNT APPEARS ADEQUATE  APTT -- 35 --  INR -- 1.18 --     Basename 04/08/12 0510 04/07/12 1640 04/07/12 1000  NA 136 -- 137  K 4.4 -- 4.3  CL 101 -- 99  CO2 25 -- 25  GLUCOSE 89 -- 86  BUN 23 -- 26*  CREATININE 1.28* 1.14* 1.17*  LABCREA -- -- --  CREAT24HRUR -- -- --  CALCIUM 8.4 -- 9.4  MG -- -- --  PHOS -- -- --  PROT -- -- 7.4  ALBUMIN -- -- 2.7*  AST -- -- 13  ALT -- -- 7  ALKPHOS -- -- 67  BILITOT -- -- 0.2*  BILIDIR -- -- --  IBILI -- -- --  PREALBUMIN -- -- --  TRIG -- -- --  CHOLHDL -- -- --  CHOL -- -- --   Estimated Creatinine Clearance: 45.1 ml/min (by C-G formula based on Cr of 1.28).   No results found for this basename: GLUCAP:3 in the last 72 hours  Medical History: Past Medical History  Diagnosis Date  . Hypertension   . Bipolar 1 disorder   . Depression   . Sleep apnea   . CRI (chronic renal insufficiency)   . Obesity (BMI 30-39.9)   . Colon cancer 04/07/2012  . 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 Sleep apnea 04/07/2012    Medications:  Scheduled:    . benztropine  1 mg Oral BID  . chlorproMAZINE  50 mg Oral BID  . clindamycin (CLEOCIN) IV  300 mg Intravenous Q8H  . clindamycin (CLEOCIN) IV   600 mg Intravenous Once  . divalproex  125 mg Oral BID  . divalproex  250 mg Oral BID  . FLUoxetine  40 mg Oral Daily  . furosemide  20 mg Oral Daily  . heparin  5,000 Units Subcutaneous Q8H  . metoprolol succinate  25 mg Oral Daily  . metronidazole  500 mg Intravenous Once  . metronidazole  500 mg Intravenous Q8H  . pantoprazole (PROTONIX) IV  40 mg Intravenous QHS  . sodium chloride  1,000 mL Intravenous Once  . vancomycin  750 mg Intravenous Q12H  . DISCONTD: metronidazole  500 mg Intravenous Once   Infusions:    . 0.9 % NaCl with KCl 20 mEq / L 100 mL/hr at 04/08/12 0940   PRN: acetaminophen, acetaminophen, diphenhydrAMINE, diphenhydrAMINE, HYDROcodone-acetaminophen, iohexol, morphine injection, ondansetron  Insulin Requirements in the past 24 hours:  CBG's since admission 86-136 No SSI ordered at this time  Current Nutrition:  Diet: NPO, sips with meds, Ice Chips MIVF: NS with 20 mEq KCL/L @ 100 ml/hr  Assessment: Patient had elective ventral incisional hernia repair with mesh on 02/04/12.  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  Now with Enterocutaneous Fistula found on admission, NPO and starting TNA per pharmacy   Labs:  Electrolytes: WNL, will order magnesium and phos levels and assess prior to starting TNA tonight   Renal: Scr slightly elevated 1.28 (CrCl 45 ml/min)  Liver: WNL  Nutritional Goals:  Estimated nutritional Needs per RD note 9/5 Kcal: 1550-1800 Protein: 80-105 Fluid: 1.5-1.8L  Clinimix E 5/20 at Goal 70 ml/hr will provide 84 grams protein, 1478 KCals on nonlipid days and 1958 on lipid days providing an average of 1684 KCals/day/week.   Plan:  1.)Stat Mg and Phos to check baseline levels 2.)Start Clinimix E 5/20 @ 40 ml/hr tonight at 1800 3.) Fat emulsion 20% at 10 ml/hr on MWF only (national backorder) 4.) Multivitamin and trace elements on MWF only (national  backorder) 5.) Routine TNA labs on Mondays and Thursdays 6.) CBG's q8h  7.) Reduce MIFV to 60 ml/hr at 1800 tonight    Eric Nees, Loma Messing PharmD Pager #: [phone] 11:53 AM 04/08/2012

## 2012-04-08 NOTE — Telephone Encounter (Signed)
Called patient again and female answered the phone and said she wasn't available and did not take message. Will try again later.

## 2012-04-08 NOTE — Progress Notes (Addendum)
Pharmacy - *Antibiotic Recommendations* (was asked to make recommendation via progress note)  Patient with History of VRE and MRSA (per notes)from previous abdominal cultures prior to admission (No microbial data available in Epic).      Recommend starting Daptomycin per pharmacy or obtaining an ID consult for treatment with Linezolid (this drug is restricted to ID) for treatment/hx of VRE  If gram negative empiric coverage wanted, recommend starting Azactam 1 gram Q8h, provides gram negative (including pseudomonal) coverage in PCN allergic patient   Agree with Flagyl for anaerobic coverage at this time.   Thanks Borgerding, Loma Messing PharmD Pager #: [phone] 9:41 AM 04/08/2012   Addendum: Dr. Andrey Campanile has ordered Aztreonam, and Daptomycin per Pharmacy.  Based on weight of 76kg and CrCl 45 ml/min  Begin 450mg  (~6mg /kg) IV q24h  Check baseline ck today, then weekly  Monitor renal function, adjust dose as needed  Loralee Pacas, PharmD, BCPS 04/08/2012 6:57 PM

## 2012-04-08 NOTE — Progress Notes (Signed)
Subjective: No real discomfort, stomach hurts some, she point to places she has had IV's sticks as main source of discomfort.   Objective: Vital signs in last 24 hours: Temp:  [96.3 F (35.7 C)-98.2 F (36.8 C)] 97.2 F (36.2 C) (09/05 1000) Pulse Rate:  [53-60] 57  (09/05 1000) Resp:  [15-22] 16  (09/05 1000) BP: (92-136)/(47-64) 97/64 mmHg (09/05 1000) SpO2:  [95 %-99 %] 97 % (09/05 1000) Weight:  [76.204 kg (168 lb)] 76.204 kg (168 lb) (09/04 1656)    NPO, on TNA, VSS, in fact BP down to 90's and HR in 50's.  On BB, labs OK, WBC is down some  Intake/Output from previous day: 09/04 0701 - 09/05 0700 In: -  Out: 1240 [Urine:1220] Intake/Output this shift: Total I/O In: 1641.7 [I.V.:1641.7] Out: 450 [Urine:450]  General appearance: alert, cooperative and no distress Resp: clear to auscultation bilaterally GI: Drainage bag in place, and working well,  Abd not really tender to palpation.  Erythema on abdomen is better., from what I can see.  a large portion is covered with Eakins pouch.  Lab Results:   Basename 04/08/12 0510 04/07/12 1640  WBC 8.5 10.2  HGB 9.2* 9.2*  HCT 28.8* 28.5*  PLT 246 255    BMET  Basename 04/08/12 0510 04/07/12 1640 04/07/12 1000  NA 136 -- 137  K 4.4 -- 4.3  CL 101 -- 99  CO2 25 -- 25  GLUCOSE 89 -- 86  BUN 23 -- 26*  CREATININE 1.28* 1.14* --  CALCIUM 8.4 -- 9.4   PT/INR  Basename 04/07/12 1640  LABPROT 15.3*  INR 1.18     Lab 04/07/12 1000  AST 13  ALT 7  ALKPHOS 67  BILITOT 0.2*  PROT 7.4  ALBUMIN 2.7*     Lipase     Component Value Date/Time   LIPASE 9* 04/07/2012 1000     Studies/Results: Dg Chest 2 View  04/07/2012  *RADIOLOGY REPORT*  Clinical Data: Weakness.  CHEST - 2 VIEW  Comparison: None.  Findings: Lung volumes are low. The cardiopericardial silhouette is enlarged.  No focal airspace consolidation or airspace pulmonary edema. Imaged bony structures of the thorax are intact. Telemetry leads overlie the  chest.  IMPRESSION: Low volume film with cardiomegaly.  No acute cardiopulmonary findings.   Original Report Authenticated By: ERIC A. MANSELL, M.D.    Ct Head Wo Contrast  04/07/2012  *RADIOLOGY REPORT*  Clinical Data:  Status post fall, right orbital/chin bruising/hematoma  CT HEAD WITHOUT CONTRAST CT MAXILLOFACIAL WITHOUT CONTRAST  Technique:  Multidetector CT imaging of the head and maxillofacial structures were performed using the standard protocol without intravenous contrast. Multiplanar CT image reconstructions of the maxillofacial structures were also generated.  Comparison:  None.  CT HEAD  Findings: No evidence of parenchymal hemorrhage or extra-axial fluid collection. No mass lesion, mass effect, or midline shift.  No CT evidence of acute infarction.  Mild global cortical atrophy.  No ventriculomegaly.  Calcifications in the right basal ganglia and cerebellum.  No evidence of calvarial fracture.  IMPRESSION: No evidence of acute intracranial abnormality.  CT MAXILLOFACIAL  Findings:   No evidence of maxillofacial fracture.  The visualized paranasal sinuses are essentially clear. The mastoid air cells are unopacified.  The bilateral orbits, including the retroconal soft tissues, are within normal limits.  Mild soft tissue swelling/bruising overlying the right maxilla (series 4/image 28).  The mandible, including the mandibular condyles, are within normal limits.  Degenerative changes of the cervical spine  to C4-5.  IMPRESSION: Mild soft tissue swelling/bruising overlying the right maxilla.  No evidence of maxillofacial fracture.   Original Report Authenticated By: Julian Hy, M.D.    Ct Abdomen Pelvis W Contrast  04/07/2012  *RADIOLOGY REPORT*  Clinical Data: Abdominal pain.  Status post recent hernia repair. Possible wound infection.  CT ABDOMEN AND PELVIS WITH CONTRAST  Technique:  Multidetector CT imaging of the abdomen and pelvis was performed following the standard protocol during bolus  administration of intravenous contrast.  Contrast: 144m OMNIPAQUE IOHEXOL 300 MG/ML  SOLN  Comparison: No priors.  Findings:  Lung Bases: Cardiomegaly.  Otherwise, unremarkable.  Abdomen/Pelvis:  The enhanced appearance of the liver, gallbladder, pancreas, spleen and bilateral adrenal glands is unremarkable. There is mild multifocal parenchymal thinning in the kidneys, most compatible with scarring.  Additionally, there are multiple tiny subcentimeter low attenuation lesions in the kidneys bilaterally which are too small to definitively characterize, but are statistically likely to represent small cysts.  The largest of these measures 9 mm in the upper pole of the right kidney.  There are postoperative changes in the transverse colon, suggesting prior partial colectomy.  The patient is also status post hysterectomy and left salpingo-oophorectomy.  A 2.7 x 1.9 cm partially calcified soft tissue attenuation (48 HU) lesion in the right adnexa is likely ovarian in etiology.  No significant volume of ascites.  No pneumoperitoneum.  No pathologic distension of small bowel.  There are numerous small bowel loops which appear intimately associated with the anterior abdominal wall.  Musculoskeletal: In the subcutaneous fat of the anterior abdominal wall immediately above and around the umbilicus there is a large rim enhancing fluid collection that measures approximately 10.7 x 7.2 x 2.0 cm, concerning for a superficial abscess.  At the level of the umbilicus, there appears to be a frank defect in the subcutaneous soft tissues of the abdomen which extends to the underlying abdominal wall musculature, and in this region there is pooling of the oral contrast material.  This is indicative of an enterocutaneous fistula (despite the fact that no definite fistulous tract is clearly identified on this examination).  There are no aggressive appearing lytic or blastic lesions noted in the visualized portions of the skeleton.   IMPRESSION: 1.  Findings, as above, compatible with an enterocutaneous fistula, likely from the small bowel, which extends to the body surface at the level of the umbilicus. 2.  In addition, there is a large abscess in the anterior abdominal wall subcutaneous fat immediately above the umbilicus, as above. 3.  2.7 x 1.9 cm soft tissue attenuation lesion in the right adnexa is likely ovarian in etiology.  This could be better evaluated with a non emergent transvaginal ultrasound to exclude neoplasm. 4.  Status post partial colectomy in the mid transverse colon.  The patient is also status post hysterectomy and left oophorectomy. 5.  Additional incidental findings, as above.   Original Report Authenticated By: DEtheleen Mayhew M.D.    Ct Maxillofacial Wo Cm  04/07/2012  *RADIOLOGY REPORT*  Clinical Data:  Status post fall, right orbital/chin bruising/hematoma  CT HEAD WITHOUT CONTRAST CT MAXILLOFACIAL WITHOUT CONTRAST  Technique:  Multidetector CT imaging of the head and maxillofacial structures were performed using the standard protocol without intravenous contrast. Multiplanar CT image reconstructions of the maxillofacial structures were also generated.  Comparison:  None.  CT HEAD  Findings: No evidence of parenchymal hemorrhage or extra-axial fluid collection. No mass lesion, mass effect, or midline shift.  No CT  evidence of acute infarction.  Mild global cortical atrophy.  No ventriculomegaly.  Calcifications in the right basal ganglia and cerebellum.  No evidence of calvarial fracture.  IMPRESSION: No evidence of acute intracranial abnormality.  CT MAXILLOFACIAL  Findings:   No evidence of maxillofacial fracture.  The visualized paranasal sinuses are essentially clear. The mastoid air cells are unopacified.  The bilateral orbits, including the retroconal soft tissues, are within normal limits.  Mild soft tissue swelling/bruising overlying the right maxilla (series 4/image 28).  The mandible, including the  mandibular condyles, are within normal limits.  Degenerative changes of the cervical spine to C4-5.  IMPRESSION: Mild soft tissue swelling/bruising overlying the right maxilla.  No evidence of maxillofacial fracture.   Original Report Authenticated By: Julian Hy, M.D.     Medications:    . benztropine  1 mg Oral BID  . chlorproMAZINE  50 mg Oral BID  . clindamycin (CLEOCIN) IV  300 mg Intravenous Q8H  . clindamycin (CLEOCIN) IV  600 mg Intravenous Once  . divalproex  125 mg Oral BID  . divalproex  250 mg Oral BID  . FLUoxetine  40 mg Oral Daily  . furosemide  20 mg Oral QODAY  . heparin  5,000 Units Subcutaneous Q8H  . metoprolol succinate  25 mg Oral Daily  . metronidazole  500 mg Intravenous Once  . metronidazole  500 mg Intravenous Q8H  . pantoprazole (PROTONIX) IV  40 mg Intravenous QHS  . vancomycin  750 mg Intravenous Q12H  . DISCONTD: furosemide  20 mg Oral Daily  . DISCONTD: metronidazole  500 mg Intravenous Once    Assessment/Plan 1. Status post partial colectomy with ventral hernia..  2. Status post ventral hernia repair with mesh, 02/04/12.  3. Status post multiple surgeries as noted above with removal of infected mesh, nonhealing abdominal wound, now with enterocutaneous fistula.  4. Bipolar disorder; lives with her sister.  5. Hypertension  6. Sleep apnea with CPAP  7. History of renal insufficiency  8. Obesity   Plan:  Medical Rx for now, i don't see much recorded from the pouch, I will ask them to monitor that closely. Discuss antibiotics with Dr. [name]. Still waiting on PICC. I will decrease BB for now.  LOS: 1 day    Antoinette Borgwardt 04/08/2012

## 2012-04-08 NOTE — Progress Notes (Signed)
[facility] PROGRESS NOTE  Gina [name] ZOX:[id number] DOB: September 19, 1950 DOA: 04/07/2012 PCP: Eustaquio Boyden, MD  Assessment/Plan: Principal Problem:  *Enterocutaneous fistula Active Problems:  Colon cancer  Sleep apnea  HTN (hypertension), benign  Bipolar 1 disorder  1. Hypertension- low BP earlier in am, but improved on recheck, add hold parameters to metoprolol. monitor and further treat accordingly. EKG reviewed no acute ischemic changes.  2. Bipolar disorder-stable, continue outpatient medications.  3. enterocutaneous fistula/abscess-per surgery/primary team  4. status post fall, likely mechanical- no reports of syncope.  5. anemia, normocytic-likely chronic disease- stable, follow.   Brief narrative: Pt is a 23  with history of hypertension, bipolar disorder, depression, sleep apnea, renal insufficiency , history of colon cancer-status post partial colectomy in the past, and status post ventral hernia repair complicated by infection and subsequent multiple surgeries admitted to [facility] s/p fall and with drainage from abdominal wound. Medicine consulted for medical management.    HPI/Subjective: Pt denies CP, no SOB and no dizziness. asking about ordering lunch>>explained about her NPO status.  Objective: Filed Vitals:   04/07/12 2140 04/08/12 0200 04/08/12 0651 04/08/12 1000  BP: 92/59 93/60 102/62 97/64  Pulse: 53 53 60 57  Temp: 97.9 F (36.6 C) 96.3 F (35.7 C) 97.3 F (36.3 C) 97.2 F (36.2 C)  TempSrc: Axillary Axillary Axillary Oral  Resp: 16 17 16 16   Height:      Weight:      SpO2: 98% 96% 96% 97%    Intake/Output Summary (Last 24 hours) at 04/08/12 1215 Last data filed at 04/08/12 1142  Gross per 24 hour  Intake 1641.67 ml  Output   1690 ml  Net -48.33 ml   Filed Weights   04/07/12 1656  Weight: 76.204 kg (168 lb)    Exam:   General:  Obese elderly female in NAD  Cardiovascular: RRR, nl s1s2  Respiratory: clear, no crackles, no  wheezes  Abdomen:soft+BS drainage bag in place with brownish liquid.  Data Reviewed: Basic Metabolic Panel:  Lab 04/08/12 8119 04/08/12 0510 04/07/12 1640 04/07/12 1000  NA -- 136 -- 137  K -- 4.4 -- 4.3  CL -- 101 -- 99  CO2 -- 25 -- 25  GLUCOSE -- 89 -- 86  BUN -- 23 -- 26*  CREATININE -- 1.28* 1.14* 1.17*  CALCIUM -- 8.4 -- 9.4  MG 2.1 -- -- --  PHOS 4.5 -- -- --   Liver Function Tests:  Lab 04/07/12 1000  AST 13  ALT 7  ALKPHOS 67  BILITOT 0.2*  PROT 7.4  ALBUMIN 2.7*    Lab 04/07/12 1000  LIPASE 9*  AMYLASE --   No results found for this basename: AMMONIA:5 in the last 168 hours CBC:  Lab 04/08/12 0510 04/07/12 1640 04/07/12 1000  WBC 8.5 10.2 11.0*  NEUTROABS -- -- 8.0*  HGB 9.2* 9.2* 10.4*  HCT 28.8* 28.5* 31.8*  MCV 92.3 90.5 90.9  PLT 246 255 PLATELET CLUMPS NOTED ON SMEAR, COUNT APPEARS ADEQUATE   Cardiac Enzymes:  Lab 04/07/12 1300 04/07/12 1000  CKTOTAL -- --  CKMB -- --  CKMBINDEX -- --  TROPONINI <0.30 <0.30   BNP (last 3 results) No results found for this basename: PROBNP:3 in the last 8760 hours CBG: No results found for this basename: GLUCAP:5 in the last 168 hours  Recent Results (from the past 240 hour(s))  WOUND CULTURE     Status: Normal (Preliminary result)   Collection Time   04/07/12  2:16 PM  Component Value Range Status Comment   Specimen Description ABDOMEN   Final    Special Requests NONE   Final    Gram Stain     Final    Value: RARE WBC PRESENT,BOTH PMN AND MONONUCLEAR     NO SQUAMOUS EPITHELIAL CELLS SEEN     MODERATE GRAM POSITIVE RODS     RARE GRAM NEGATIVE RODS     RARE GRAM POSITIVE COCCI   Culture Culture reincubated for better growth   Final    Report Status PENDING   Incomplete   WOUND CULTURE     Status: Normal (Preliminary result)   Collection Time   04/07/12  2:16 PM      Component Value Range Status Comment   Specimen Description ABDOMEN   Final    Special Requests NONE   Final    Gram Stain  PENDING   Incomplete    Culture NO GROWTH 1 DAY   Final    Report Status PENDING   Incomplete      Studies: Dg Chest 2 View  04/07/2012  *RADIOLOGY REPORT*  Clinical Data: Weakness.  CHEST - 2 VIEW  Comparison: None.  Findings: Lung volumes are low. The cardiopericardial silhouette is enlarged.  No focal airspace consolidation or airspace pulmonary edema. Imaged bony structures of the thorax are intact. Telemetry leads overlie the chest.  IMPRESSION: Low volume film with cardiomegaly.  No acute cardiopulmonary findings.   Original Report Authenticated By: ERIC A. MANSELL, M.D.    Ct Head Wo Contrast  04/07/2012  *RADIOLOGY REPORT*  Clinical Data:  Status post fall, right orbital/chin bruising/hematoma  CT HEAD WITHOUT CONTRAST CT MAXILLOFACIAL WITHOUT CONTRAST  Technique:  Multidetector CT imaging of the head and maxillofacial structures were performed using the standard protocol without intravenous contrast. Multiplanar CT image reconstructions of the maxillofacial structures were also generated.  Comparison:  None.  CT HEAD  Findings: No evidence of parenchymal hemorrhage or extra-axial fluid collection. No mass lesion, mass effect, or midline shift.  No CT evidence of acute infarction.  Mild global cortical atrophy.  No ventriculomegaly.  Calcifications in the right basal ganglia and cerebellum.  No evidence of calvarial fracture.  IMPRESSION: No evidence of acute intracranial abnormality.  CT MAXILLOFACIAL  Findings:   No evidence of maxillofacial fracture.  The visualized paranasal sinuses are essentially clear. The mastoid air cells are unopacified.  The bilateral orbits, including the retroconal soft tissues, are within normal limits.  Mild soft tissue swelling/bruising overlying the right maxilla (series 4/image 28).  The mandible, including the mandibular condyles, are within normal limits.  Degenerative changes of the cervical spine to C4-5.  IMPRESSION: Mild soft tissue swelling/bruising overlying  the right maxilla.  No evidence of maxillofacial fracture.   Original Report Authenticated By: Charline Bills, M.D.    Ct Abdomen Pelvis W Contrast  04/07/2012  *RADIOLOGY REPORT*  Clinical Data: Abdominal pain.  Status post recent hernia repair. Possible wound infection.  CT ABDOMEN AND PELVIS WITH CONTRAST  Technique:  Multidetector CT imaging of the abdomen and pelvis was performed following the standard protocol during bolus administration of intravenous contrast.  Contrast: OMNIPAQUE IOHEXOL 300 MG/ML  SOLN  Comparison: No priors.  Findings:  Lung Bases: Cardiomegaly.  Otherwise, unremarkable.  Abdomen/Pelvis:  The enhanced appearance of the liver, gallbladder, pancreas, spleen and bilateral adrenal glands is unremarkable. There is mild multifocal parenchymal thinning in the kidneys, most compatible with scarring.  Additionally, there are multiple tiny subcentimeter low attenuation lesions  in the kidneys bilaterally which are too small to definitively characterize, but are statistically likely to represent small cysts.  The largest of these measures 9 mm in the upper pole of the right kidney.  There are postoperative changes in the transverse colon, suggesting prior partial colectomy.  The patient is also status post hysterectomy and left salpingo-oophorectomy.  A 2.7 x 1.9 cm partially calcified soft tissue attenuation (48 HU) lesion in the right adnexa is likely ovarian in etiology.  No significant volume of ascites.  No pneumoperitoneum.  No pathologic distension of small bowel.  There are numerous small bowel loops which appear intimately associated with the anterior abdominal wall.  Musculoskeletal: In the subcutaneous fat of the anterior abdominal wall immediately above and around the umbilicus there is a large rim enhancing fluid collection that measures approximately 10.7 x 7.2 x 2.0 cm, concerning for a superficial abscess.  At the level of the umbilicus, there appears to be a frank defect in  the subcutaneous soft tissues of the abdomen which extends to the underlying abdominal wall musculature, and in this region there is pooling of the oral contrast material.  This is indicative of an enterocutaneous fistula (despite the fact that no definite fistulous tract is clearly identified on this examination).  There are no aggressive appearing lytic or blastic lesions noted in the visualized portions of the skeleton.  IMPRESSION: 1.  Findings, as above, compatible with an enterocutaneous fistula, likely from the small bowel, which extends to the body surface at the level of the umbilicus. 2.  In addition, there is a large abscess in the anterior abdominal wall subcutaneous fat immediately above the umbilicus, as above. 3.  2.7 x 1.9 cm soft tissue attenuation lesion in the right adnexa is likely ovarian in etiology.  This could be better evaluated with a non emergent transvaginal ultrasound to exclude neoplasm. 4.  Status post partial colectomy in the mid transverse colon.  The patient is also status post hysterectomy and left oophorectomy. 5.  Additional incidental findings, as above.   Original Report Authenticated By: Florencia Reasons, M.D.    Ct Maxillofacial Wo Cm  04/07/2012  *RADIOLOGY REPORT*  Clinical Data:  Status post fall, right orbital/chin bruising/hematoma  CT HEAD WITHOUT CONTRAST CT MAXILLOFACIAL WITHOUT CONTRAST  Technique:  Multidetector CT imaging of the head and maxillofacial structures were performed using the standard protocol without intravenous contrast. Multiplanar CT image reconstructions of the maxillofacial structures were also generated.  Comparison:  None.  CT HEAD  Findings: No evidence of parenchymal hemorrhage or extra-axial fluid collection. No mass lesion, mass effect, or midline shift.  No CT evidence of acute infarction.  Mild global cortical atrophy.  No ventriculomegaly.  Calcifications in the right basal ganglia and cerebellum.  No evidence of calvarial fracture.   IMPRESSION: No evidence of acute intracranial abnormality.  CT MAXILLOFACIAL  Findings:   No evidence of maxillofacial fracture.  The visualized paranasal sinuses are essentially clear. The mastoid air cells are unopacified.  The bilateral orbits, including the retroconal soft tissues, are within normal limits.  Mild soft tissue swelling/bruising overlying the right maxilla (series 4/image 28).  The mandible, including the mandibular condyles, are within normal limits.  Degenerative changes of the cervical spine to C4-5.  IMPRESSION: Mild soft tissue swelling/bruising overlying the right maxilla.  No evidence of maxillofacial fracture.   Original Report Authenticated By: Charline Bills, M.D.     Scheduled Meds:   . benztropine  1 mg Oral BID  .  chlorproMAZINE  50 mg Oral BID  . clindamycin (CLEOCIN) IV  300 mg Intravenous Q8H  . clindamycin (CLEOCIN) IV  600 mg Intravenous Once  . divalproex  125 mg Oral BID  . divalproex  250 mg Oral BID  . FLUoxetine  40 mg Oral Daily  . furosemide  20 mg Oral QODAY  . heparin  5,000 Units Subcutaneous Q8H  . metoprolol succinate  25 mg Oral Daily  . metronidazole  500 mg Intravenous Once  . metronidazole  500 mg Intravenous Q8H  . pantoprazole (PROTONIX) IV  40 mg Intravenous QHS  . vancomycin  750 mg Intravenous Q12H  . DISCONTD: furosemide  20 mg Oral Daily  . DISCONTD: metronidazole  500 mg Intravenous Once   Continuous Infusions:   . 0.9 % NaCl with KCl 20 mEq / L 100 mL/hr at 04/08/12 0940  . 0.9 % NaCl with KCl 20 mEq / L    . TPN (CLINIMIX) +/- additives      Principal Problem:  *Enterocutaneous fistula Active Problems:  Colon cancer  Sleep apnea  HTN (hypertension), benign  Bipolar 1 disorder    Time spent:    Phoenix Dresser C  [facility] Pager [phone] If 8PM-8AM, please contact night-coverage at [email or web address], password [facility] 04/08/2012, 12:15 PM  LOS: 1 day

## 2012-04-08 NOTE — Progress Notes (Addendum)
Pt was bladder scanned after not having any recording of voiding all shift. She denied any urge to void and stated to the nurse I will let you know when I have to go. Bladder scanning revealed 999 ml within her bladder. Pt was asked if she would get up, walk to the bathroom and try to void before the nurse would have to get an order to cath her. The pt once OOB and standing was very wobbly and unsteady on her feet. She reported ambulating with a cane at home, and that she was feeling very sleepy that's why she was wobbly. She was able to void 720 ml once she got up.

## 2012-04-08 NOTE — Progress Notes (Signed)
INITIAL ADULT NUTRITION ASSESSMENT Date: 04/08/2012   Time: 10:27 AM Reason for Assessment: New TNA  INTERVENTION: TNA per pharmacy. Diet advancement per MD. Will monitor.   ASSESSMENT: Female 61 y.o.  Dx: Enterocutaneous fistula  Food/Nutrition Related Hx: Pt admitted with fall that caused her to hit her chin and also c/o abdominal pain. Pt with large midline surgical scar with drainage. Pt with missing teeth. Pt found to have enterocutaneous fistula and is scheduled to start TNA tonight. Attempted to gather information from pt about her dietary history, however pt seemed confused. She would say she would eat 3 meals/day but then say she was eating poorly and then she said her usual weight was 702 pounds.   Hx:  Past Medical History  Diagnosis Date  . Hypertension   . Bipolar 1 disorder   . Depression   . Sleep apnea   . CRI (chronic renal insufficiency)   . Obesity (BMI 30-39.9)   . Colon cancer 04/07/2012  . 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 Sleep apnea 04/07/2012   Related Meds:  Scheduled Meds:   . benztropine  1 mg Oral BID  . chlorproMAZINE  50 mg Oral BID  . clindamycin (CLEOCIN) IV  300 mg Intravenous Q8H  . clindamycin (CLEOCIN) IV  600 mg Intravenous Once  . divalproex  125 mg Oral BID  . divalproex  250 mg Oral BID  . FLUoxetine  40 mg Oral Daily  . furosemide  20 mg Oral Daily  . heparin  5,000 Units Subcutaneous Q8H  . metoprolol succinate  25 mg Oral Daily  . metronidazole  500 mg Intravenous Once  . metronidazole  500 mg Intravenous Q8H  . pantoprazole (PROTONIX) IV  40 mg Intravenous QHS  . sodium chloride  1,000 mL Intravenous Once  . vancomycin  750 mg Intravenous Q12H  . DISCONTD: metronidazole  500 mg Intravenous Once   Continuous Infusions:   . 0.9 % NaCl with KCl 20 mEq / L 100 mL/hr at 04/08/12 0940   PRN Meds:.acetaminophen, acetaminophen, diphenhydrAMINE, diphenhydrAMINE, HYDROcodone-acetaminophen, iohexol, morphine injection,  ondansetron  Ht: 5\' 3"  (160 cm)  Wt: 168 lb (76.204 kg)  Ideal Wt: 115 lb % Ideal Wt: 146  Usual Wt: Unable to assess % Usual Wt: Unable to assess  Wt Readings from Last 10 Encounters:  04/07/12 168 lb (76.204 kg)    Body mass index is 29.76 kg/(m^2).   Labs:  CMP     Component Value Date/Time   NA 136 04/08/2012 0510   K 4.4 04/08/2012 0510   CL 101 04/08/2012 0510   CO2 25 04/08/2012 0510   GLUCOSE 89 04/08/2012 0510   BUN 23 04/08/2012 0510   CREATININE 1.28* 04/08/2012 0510   CALCIUM 8.4 04/08/2012 0510   PROT 7.4 04/07/2012 1000   ALBUMIN 2.7* 04/07/2012 1000   AST 13 04/07/2012 1000   ALT 7 04/07/2012 1000   ALKPHOS 67 04/07/2012 1000   BILITOT 0.2* 04/07/2012 1000   GFRNONAA 44* 04/08/2012 0510   GFRAA 51* 04/08/2012 0510    Intake/Output Summary (Last 24 hours) at 04/08/12 1035 Last data filed at 04/08/12 0600  Gross per 24 hour  Intake      0 ml  Output   1240 ml  Net  -1240 ml   Last BM - PTA  Diet Order: NPO   IVF:    0.9 % NaCl with KCl 20 mEq / L Last Rate: 100 mL/hr at 04/08/12 0940    Estimated Nutritional Needs:  Kcal:1550-1800 Protein:80-105g Fluid:1.5-1.8L  NUTRITION DIAGNOSIS: -Inadequate oral intake (NI-2.1).  Status: Ongoing  RELATED TO: fistula  AS EVIDENCE BY: NPO  MONITORING/EVALUATION(Goals): TNA to meet >90% of estimated nutritional needs.   EDUCATION NEEDS: -Education not appropriate at this time   Dietitian #: [phone]  DOCUMENTATION CODES Per approved criteria  -Not Applicable    Marshall Cork 04/08/2012, 10:27 AM

## 2012-04-08 NOTE — Consult Note (Signed)
WOC follow up Placed small Eakin wound pouch to ECF in ER yesterday.  Very little output from this area, bedside nursing reports 20cc overnight.  Today the pouch is intact but noted that the upper wound has quite a bit of drainage that is leaking thru dressing.  I have replaced gauze dressing to the upper area, it is dark brown output but does not smell to be fecal material.  No odor from this wound.  Will attempt to manage upper wound with BID dressings, making sure to pack completley into dead space.  Wound appears larger than initially measured in ER yesterday Upper wound: Measurement: 2cm x 1.5cm x 4.0cm (tunnels toward 12 oclock) Wound bed is moist and soupy with yellow-brown drainage. Filled wound with gauze, covered with gauze topper and thin film to protect Eakin pouch from loosing seal from the drainage from this wound. Will follow up tom as well to see if wound drainage is more of an issue than dressings can manage. Additional small Eakin pouch ordered to bedside for use if needed.  WOC will follow along with you Dagmar Adcox Marlena Clipper, CWOCN [phone]

## 2012-04-08 NOTE — Clinical Documentation Improvement (Signed)
CKD DOCUMENTATION CLARIFICATION QUERY   THIS DOCUMENT IS NOT A PERMANENT PART OF THE MEDICAL RECORD  TO RESPOND TO THE THIS QUERY, FOLLOW THE INSTRUCTIONS BELOW:  1. If needed, update documentation for the patient's encounter via the notes activity.  2. Access this query again and click edit on the In [employer].  3. After updating, or not, click F2 to complete all highlighted (required) fields concerning your review. Select "additional documentation in the medical record" OR "no additional documentation provided".  4. Click Sign note button.  5. The deficiency will fall out of your In Basket *Please let us know if you are not able to complete this workflow by phone or e-mail (listed below).  Please update your documentation within the medical record to reflect your response to this query.                                                                                        04/08/12   Dear Dr. Thedore Mins,  In a better effort to capture your patient's severity of illness, reflect appropriate length of stay and utilization of resources, a review of the patient medical record has revealed the following indicators.    Based on your clinical judgment, please clarify and document in a progress note and/or discharge summary the clinical condition associated with the following supporting information:  In responding to this query please exercise your independent judgment.  The fact that a query is asked, does not imply that any particular answer is desired or expected.  Pt admitted with Enterocutaneous fistula   According to pn pt with "CRI" and consult note on 04/07/12 states pt with "Renal Insufficiency" Lab GFR =    Please clarify if the "CRI" and "Renal Insufficiency" can be specified as one of the diagnoses listed below.   Best Practice: Please note stage of CKD for each in-patient admission.   Possible Clinical Conditions?   _______CKD Stage I -  GFR > OR = 90 _______CKD  Stage II - GFR 60-80 _______CKD Stage III - GFR 30-59 _______CKD Stage IV - GFR 15-29 _______CKD Stage V - GFR < 15 _______ESRD (End Stage Renal Disease) _______Other condition_____________ _______Cannot Clinically determine   Supporting Information:  Risk Factors:  Enterocutaneous fistula, CRI, Renal Insufficiency, Colon cancer, anemia, Morbid obese Signs & Symptoms:   Diagnostics:  Component     Latest Ref Rng 04/07/2012        10:00 AM  BUN     6 - 23 mg/dL 26 (H)  Creatinine     0.50 - 1.10 mg/dL 4.09 (H)    Component     Latest Ref Rng 04/07/2012        10:00 AM  GFR calc non Af Amer     >90 mL/min 49 (L)    Treatment: Monitoring  You may use possible, probable, or suspect with inpatient documentation. possible, probable, suspected diagnoses MUST be documented at the time of discharge  Reviewed:  no additional documentation provided ljh   Thank You,  Enis Slipper  RN, BSN, MSN/Inf, CCDS Clinical Documentation Specialist [facility] Dept Pager: [phone] / E-mail: [email or web address]  Health  Information Management [place]

## 2012-04-08 NOTE — Plan of Care (Signed)
Some of the pt's belongings were sent to security and locked up in a safe. Two nurses were present in the room with the patient as she counted her money which totaled $112.00. In addition to the money she also added two bank of [place] debit card and 1 [employer] and exercise psychologist card. All the items were placed in an envelop, signed by the Pt, both nurses and taken to security by one the witnessing nurses. The pt's purse was then placed in her closet.

## 2012-04-08 NOTE — Progress Notes (Signed)
Peripherally Inserted Central Catheter/Midline Placement  The IV Nurse has discussed with the patient and/or persons authorized to consent for the patient, the purpose of this procedure and the potential benefits and risks involved with this procedure.  The benefits include less needle sticks, lab draws from the catheter and patient may be discharged home with the catheter.  Risks include, but not limited to, infection, bleeding, blood clot (thrombus formation), and puncture of an artery; nerve damage and irregular heat beat.  Alternatives to this procedure were also discussed.  PICC/Midline Placement Documentation        Lisabeth Devoid 04/08/2012, 3:22 PM

## 2012-04-09 LAB — CBC
HCT: 27.1 % — ABNORMAL LOW (ref 36.0–46.0)
RDW: 14.4 % (ref 11.5–15.5)
WBC: 10.4 10*3/uL (ref 4.0–10.5)

## 2012-04-09 LAB — DIFFERENTIAL
Basophils Absolute: 0 10*3/uL (ref 0.0–0.1)
Lymphocytes Relative: 19 % (ref 12–46)
Monocytes Absolute: 1.2 10*3/uL — ABNORMAL HIGH (ref 0.1–1.0)
Neutro Abs: 6.9 10*3/uL (ref 1.7–7.7)
Neutrophils Relative %: 66 % (ref 43–77)

## 2012-04-09 LAB — GLUCOSE, CAPILLARY
Glucose-Capillary: 118 mg/dL — ABNORMAL HIGH (ref 70–99)
Glucose-Capillary: 129 mg/dL — ABNORMAL HIGH (ref 70–99)

## 2012-04-09 LAB — COMPREHENSIVE METABOLIC PANEL
AST: 6 U/L (ref 0–37)
Albumin: 2.1 g/dL — ABNORMAL LOW (ref 3.5–5.2)
Alkaline Phosphatase: 60 U/L (ref 39–117)
Chloride: 101 mEq/L (ref 96–112)
Creatinine, Ser: 1.11 mg/dL — ABNORMAL HIGH (ref 0.50–1.10)
Potassium: 4.4 mEq/L (ref 3.5–5.1)
Total Bilirubin: 0.2 mg/dL — ABNORMAL LOW (ref 0.3–1.2)

## 2012-04-09 LAB — CHOLESTEROL, TOTAL: Cholesterol: 92 mg/dL (ref 0–200)

## 2012-04-09 LAB — TRIGLYCERIDES: Triglycerides: 146 mg/dL (ref <150)

## 2012-04-09 LAB — MAGNESIUM: Magnesium: 2.1 mg/dL (ref 1.5–2.5)

## 2012-04-09 MED ORDER — ZINC TRACE METAL 1 MG/ML IV SOLN
INTRAVENOUS | Status: AC
  Administered 2012-04-09: 18:00:00 via INTRAVENOUS
  Filled 2012-04-09: qty 2000

## 2012-04-09 MED ORDER — FAT EMULSION 20 % IV EMUL
240.0000 mL | INTRAVENOUS | Status: AC
  Administered 2012-04-09: 240 mL via INTRAVENOUS
  Filled 2012-04-09: qty 250

## 2012-04-09 NOTE — Progress Notes (Signed)
[facility] PROGRESS NOTE  Lyncoln Maskell WUX:[id number] DOB: June 20, 1951 DOA: 04/07/2012 PCP: Eustaquio Boyden, MD  Assessment/Plan: Principal Problem:  *Enterocutaneous fistula Active Problems:  Colon cancer  Sleep apnea  HTN (hypertension), benign  Bipolar 1 disorder  1. Hypertension- continue to monitor  BPs continue hold parameters for metoprolol, follow and further treat accordingly  EKG reviewed no acute ischemic changes.  2. Bipolar disorder-stable, continue outpatient medications.  3. enterocutaneous fistula/abscess-per surgery/primary team  4. status post fall, likely mechanical- no reports of syncope.  5. anemia, normocytic-likely chronic disease- no significant change, follow.   Brief narrative: Pt is a 50  with history of hypertension, bipolar disorder, depression, sleep apnea, renal insufficiency , history of colon cancer-status post partial colectomy in the past, and status post ventral hernia repair complicated by infection and subsequent multiple surgeries [facility]/p fall and with drainage from abdominal wound. Medicine consulted for medical management.    HPI/Subjective: Pt sleeping but easily aroused and denies CP, no SOB.  Objective: Filed Vitals:   04/08/12 1400 04/08/12 2054 04/08/12 2301 04/09/12 0654  BP: 122/76  131/72 101/57  Pulse: 60 62 72 57  Temp: 98.1 F (36.7 C)  98.2 F (36.8 C) 98.3 F (36.8 C)  TempSrc: Oral  Oral Oral  Resp: 16 17 18 18   Height:      Weight:      SpO2: 97% 96% 100% 94%    Intake/Output Summary (Last 24 hours) at 04/09/12 1310 Last data filed at 04/09/12 1100  Gross per 24 hour  Intake    850 ml  Output   2135 ml  Net  -1285 ml   Filed Weights   04/07/12 1656  Weight: 76.204 kg (168 lb)    Exam:   General:  Obese elderly female in NAD  Cardiovascular: RRR, nl s1s2  Respiratory: clear, no crackles, no wheezes  Abdomen:soft+BS drainage bag in place with brownish liquid.  Data  Reviewed: Basic Metabolic Panel:  Lab 04/09/12 2536 04/08/12 0513 04/08/12 0510 04/07/12 1640 04/07/12 1000  NA 135 -- 136 -- 137  K 4.4 -- 4.4 -- 4.3  CL 101 -- 101 -- 99  CO2 25 -- 25 -- 25  GLUCOSE 117* -- 89 -- 86  BUN 22 -- 23 -- 26*  CREATININE 1.11* -- 1.28* 1.14* 1.17*  CALCIUM 8.4 -- 8.4 -- 9.4  MG 2.1 2.1 -- -- --  PHOS 3.6 4.5 -- -- --   Liver Function Tests:  Lab 04/09/12 0503 04/07/12 1000  AST 6 13  ALT <5 7  ALKPHOS 60 67  BILITOT 0.2* 0.2*  PROT 5.9* 7.4  ALBUMIN 2.1* 2.7*    Lab 04/07/12 1000  LIPASE 9*  AMYLASE --   No results found for this basename: AMMONIA:5 in the last 168 hours CBC:  Lab 04/09/12 0503 04/08/12 0510 04/07/12 1640 04/07/12 1000  WBC 10.4 8.5 10.2 11.0*  NEUTROABS 6.9 -- -- 8.0*  HGB 8.8* 9.2* 9.2* 10.4*  HCT 27.1* 28.8* 28.5* 31.8*  MCV 91.2 92.3 90.5 90.9  PLT 277 246 255 PLATELET CLUMPS NOTED ON SMEAR, COUNT APPEARS ADEQUATE   Cardiac Enzymes:  Lab 04/08/12 1930 04/07/12 1300 04/07/12 1000  CKTOTAL 37 -- --  CKMB -- -- --  CKMBINDEX -- -- --  TROPONINI -- <0.30 <0.30   BNP (last 3 results) No results found for this basename: PROBNP:3 in the last 8760 hours CBG:  Lab 04/09/12 0750  GLUCAP 118*    Recent Results (from the  past 240 hour(s))  WOUND CULTURE     Status: Normal (Preliminary result)   Collection Time   04/07/12  2:16 PM      Component Value Range Status Comment   Specimen Description ABDOMEN   Final    Special Requests NONE   Final    Gram Stain     Final    Value: RARE WBC PRESENT,BOTH PMN AND MONONUCLEAR     NO SQUAMOUS EPITHELIAL CELLS SEEN     MODERATE GRAM POSITIVE RODS     RARE GRAM NEGATIVE RODS     RARE GRAM POSITIVE COCCI   Culture Culture reincubated for better growth   Final    Report Status PENDING   Incomplete   WOUND CULTURE     Status: Normal (Preliminary result)   Collection Time   04/07/12  2:16 PM      Component Value Range Status Comment   Specimen Description ABDOMEN   Final      Special Requests NONE   Final    Gram Stain     Final    Value: ABUNDANT WBC PRESENT,BOTH PMN AND MONONUCLEAR     NO SQUAMOUS EPITHELIAL CELLS SEEN     MODERATE GRAM POSITIVE RODS   Culture MULTIPLE ORGANISMS PRESENT, NONE PREDOMINANT   Final    Report Status PENDING   Incomplete      Studies: Dg Chest 2 View  04/07/2012  *RADIOLOGY REPORT*  Clinical Data: Weakness.  CHEST - 2 VIEW  Comparison: None.  Findings: Lung volumes are low. The cardiopericardial silhouette is enlarged.  No focal airspace consolidation or airspace pulmonary edema. Imaged bony structures of the thorax are intact. Telemetry leads overlie the chest.  IMPRESSION: Low volume film with cardiomegaly.  No acute cardiopulmonary findings.   Original Report Authenticated By: ERIC A. MANSELL, M.D.    Ct Head Wo Contrast  04/07/2012  *RADIOLOGY REPORT*  Clinical Data:  Status post fall, right orbital/chin bruising/hematoma  CT HEAD WITHOUT CONTRAST CT MAXILLOFACIAL WITHOUT CONTRAST  Technique:  Multidetector CT imaging of the head and maxillofacial structures were performed using the standard protocol without intravenous contrast. Multiplanar CT image reconstructions of the maxillofacial structures were also generated.  Comparison:  None.  CT HEAD  Findings: No evidence of parenchymal hemorrhage or extra-axial fluid collection. No mass lesion, mass effect, or midline shift.  No CT evidence of acute infarction.  Mild global cortical atrophy.  No ventriculomegaly.  Calcifications in the right basal ganglia and cerebellum.  No evidence of calvarial fracture.  IMPRESSION: No evidence of acute intracranial abnormality.  CT MAXILLOFACIAL  Findings:   No evidence of maxillofacial fracture.  The visualized paranasal sinuses are essentially clear. The mastoid air cells are unopacified.  The bilateral orbits, including the retroconal soft tissues, are within normal limits.  Mild soft tissue swelling/bruising overlying the right maxilla (series  4/image 28).  The mandible, including the mandibular condyles, are within normal limits.  Degenerative changes of the cervical spine to C4-5.  IMPRESSION: Mild soft tissue swelling/bruising overlying the right maxilla.  No evidence of maxillofacial fracture.   Original Report Authenticated By: Charline Bills, M.D.    Ct Abdomen Pelvis W Contrast  04/07/2012  *RADIOLOGY REPORT*  Clinical Data: Abdominal pain.  Status post recent hernia repair. Possible wound infection.  CT ABDOMEN AND PELVIS WITH CONTRAST  Technique:  Multidetector CT imaging of the abdomen and pelvis was performed following the standard protocol during bolus administration of intravenous contrast.  Contrast: OMNIPAQUE IOHEXOL  300 MG/ML  SOLN  Comparison: No priors.  Findings:  Lung Bases: Cardiomegaly.  Otherwise, unremarkable.  Abdomen/Pelvis:  The enhanced appearance of the liver, gallbladder, pancreas, spleen and bilateral adrenal glands is unremarkable. There is mild multifocal parenchymal thinning in the kidneys, most compatible with scarring.  Additionally, there are multiple tiny subcentimeter low attenuation lesions in the kidneys bilaterally which are too small to definitively characterize, but are statistically likely to represent small cysts.  The largest of these measures 9 mm in the upper pole of the right kidney.  There are postoperative changes in the transverse colon, suggesting prior partial colectomy.  The patient is also status post hysterectomy and left salpingo-oophorectomy.  A 2.7 x 1.9 cm partially calcified soft tissue attenuation (48 HU) lesion in the right adnexa is likely ovarian in etiology.  No significant volume of ascites.  No pneumoperitoneum.  No pathologic distension of small bowel.  There are numerous small bowel loops which appear intimately associated with the anterior abdominal wall.  Musculoskeletal: In the subcutaneous fat of the anterior abdominal wall immediately above and around the umbilicus  there is a large rim enhancing fluid collection that measures approximately 10.7 x 7.2 x 2.0 cm, concerning for a superficial abscess.  At the level of the umbilicus, there appears to be a frank defect in the subcutaneous soft tissues of the abdomen which extends to the underlying abdominal wall musculature, and in this region there is pooling of the oral contrast material.  This is indicative of an enterocutaneous fistula (despite the fact that no definite fistulous tract is clearly identified on this examination).  There are no aggressive appearing lytic or blastic lesions noted in the visualized portions of the skeleton.  IMPRESSION: 1.  Findings, as above, compatible with an enterocutaneous fistula, likely from the small bowel, which extends to the body surface at the level of the umbilicus. 2.  In addition, there is a large abscess in the anterior abdominal wall subcutaneous fat immediately above the umbilicus, as above. 3.  2.7 x 1.9 cm soft tissue attenuation lesion in the right adnexa is likely ovarian in etiology.  This could be better evaluated with a non emergent transvaginal ultrasound to exclude neoplasm. 4.  Status post partial colectomy in the mid transverse colon.  The patient is also status post hysterectomy and left oophorectomy. 5.  Additional incidental findings, as above.   Original Report Authenticated By: Florencia Reasons, M.D.    Ct Maxillofacial Wo Cm  04/07/2012  *RADIOLOGY REPORT*  Clinical Data:  Status post fall, right orbital/chin bruising/hematoma  CT HEAD WITHOUT CONTRAST CT MAXILLOFACIAL WITHOUT CONTRAST  Technique:  Multidetector CT imaging of the head and maxillofacial structures were performed using the standard protocol without intravenous contrast. Multiplanar CT image reconstructions of the maxillofacial structures were also generated.  Comparison:  None.  CT HEAD  Findings: No evidence of parenchymal hemorrhage or extra-axial fluid collection. No mass lesion, mass effect,  or midline shift.  No CT evidence of acute infarction.  Mild global cortical atrophy.  No ventriculomegaly.  Calcifications in the right basal ganglia and cerebellum.  No evidence of calvarial fracture.  IMPRESSION: No evidence of acute intracranial abnormality.  CT MAXILLOFACIAL  Findings:   No evidence of maxillofacial fracture.  The visualized paranasal sinuses are essentially clear. The mastoid air cells are unopacified.  The bilateral orbits, including the retroconal soft tissues, are within normal limits.  Mild soft tissue swelling/bruising overlying the right maxilla (series 4/image 28).  The mandible,  including the mandibular condyles, are within normal limits.  Degenerative changes of the cervical spine to C4-5.  IMPRESSION: Mild soft tissue swelling/bruising overlying the right maxilla.  No evidence of maxillofacial fracture.   Original Report Authenticated By: Charline Bills, M.D.     Scheduled Meds:    . aztreonam  1 g Intravenous Q8H  . benztropine  1 mg Oral BID  . chlorproMAZINE  50 mg Oral BID  . DAPTOmycin (CUBICIN)  IV  6 mg/kg Intravenous Q24H  . divalproex  125 mg Oral BID  . divalproex  250 mg Oral BID  . FLUoxetine  40 mg Oral Daily  . heparin  5,000 Units Subcutaneous Q8H  . metoprolol succinate  12.5 mg Oral Daily  . metronidazole  500 mg Intravenous Q8H  . pantoprazole (PROTONIX) IV  40 mg Intravenous QHS  . DISCONTD: clindamycin (CLEOCIN) IV  300 mg Intravenous Q8H  . DISCONTD: furosemide  20 mg Oral QODAY  . DISCONTD: metoprolol succinate  12.5 mg Oral Daily  . DISCONTD: vancomycin  750 mg Intravenous Q12H   Continuous Infusions:    . 0.9 % NaCl with KCl 20 mEq / L 100 mL/hr at 04/08/12 0940  . 0.9 % NaCl with KCl 20 mEq / L    . fat emulsion    . TPN (CLINIMIX) +/- additives 40 mL/hr at 04/08/12 1858  . TPN (CLINIMIX) +/- additives      Principal Problem:  *Enterocutaneous fistula Active Problems:  Colon cancer  Sleep apnea  HTN (hypertension),  benign  Bipolar 1 disorder    Time spent:    Lisha Vitale C  [facility] Pager [phone] If 8PM-8AM, please contact night-coverage at [email or web address], password [facility] 04/09/2012, 1:10 PM  LOS: 2 days

## 2012-04-09 NOTE — Progress Notes (Signed)
Clinical Social Work Department BRIEF PSYCHOSOCIAL ASSESSMENT 04/09/2012  Patient:  Gina [name], Gina [name]     Account Number:  [id number]     Admit date:  04/07/2012  Clinical Social Worker: Lia Foyer LCSWA ,  Date/Time:  04/09/2012 02:57 PM  Referred by:  Physician  Date Referred:  04/09/2012 Referred for  SNF Placement   Other Referral:   Interview type:  Family Other interview type:    PSYCHOSOCIAL DATA Living Status:  FAMILY Admitted from facility:   Level of care:   Primary support name:  Gina [name] Primary support relationship to patient:  FAMILY Degree of support available:   Strong and vested.    CURRENT CONCERNS Current Concerns  Post-Acute Placement   Other Concerns:    SOCIAL WORK ASSESSMENT / PLAN CSW consulted to assist in facilitating SNF placement. CSW contacted the patient's nephew due to RN stating the patient was currently confused. Pt. moved to [facility] Saturday, from [place] and was living with nephew, Gina [name] [phone]) and his wife Gina [name] [phone]). Joe was at work at the time of contacting him, and provided the CSW with permission to speak to his wife re:SNF placement. The nephew stated do "whatever is best for my aunt" re: SNF. CSW contacted Gina [name] and CSW provided supportive counseling. CSW inquired about patient's current living situation, and d/c plan. Gina [name] stated she would be agreeable to a SNF in [place]. CSW informed Gina [name] of the SNF process and also asked if the patient set up a PCP as the RN had asked the patient to do. Patient had an appointment at [facility] on the 10th but had to cancel due to current hospitalization. CSW will follow up with the family when bed offers are available.   Assessment/plan status:  Information/Referral to [facility] Other assessment/ plan:   Information/referral to community resources:    PATIENT'S/FAMILY'S RESPONSE TO PLAN OF CARE: Family thanked CSW for facilitating SNF process  and is agreeable to d/c plan.     Lia Foyer, LCSWA [facility] Clinical Social Worker Contact #: [phone] (PRN)

## 2012-04-09 NOTE — Progress Notes (Signed)
Enterocutaneous fistula  Subjective: Patient is hear after removal of infected mesh and development of EC fistula/draining abscess.  Tolerating TPN.  Minimal drainage with NPO (~37m/d).    Objective: Vital signs in last 24 hours: Temp:  [97.2 F (36.2 C)-98.3 F (36.8 C)] 98.3 F (36.8 C) (09/06 0654) Pulse Rate:  [57-72] 57  (09/06 0654) Resp:  [16-18] 18  (09/06 0654) BP: (97-131)/(57-76) 101/57 mmHg (09/06 0654) SpO2:  [94 %-100 %] 94 % (09/06 0654)    Intake/Output from previous day: 09/05 0701 - 09/06 0700 In: 2491.7 [P.O.:30; I.V.:2301.7; TPN:160] Out: 1835 [Urine:1800] Intake/Output this shift:    General appearance: alert, cooperative and no distress GI: soft, non-tender; bowel sounds normal; no masses,  no organomegaly Incision/Wound: superior wound with no signs of cellulitis, packing removed, purulence noted, no bilious output noted  Lab Results:  Results for orders placed during the hospital encounter of 04/07/12 (from the past 24 hour(s))  CK     Status: Normal   Collection Time   04/08/12  7:30 PM      Component Value Range   Total CK 37  7 - 177 U/L  COMPREHENSIVE METABOLIC PANEL     Status: Abnormal   Collection Time   04/09/12  5:03 AM      Component Value Range   Sodium 135  135 - 145 mEq/L   Potassium 4.4  3.5 - 5.1 mEq/L   Chloride 101  96 - 112 mEq/L   CO2 25  19 - 32 mEq/L   Glucose, Bld 117 (*) 70 - 99 mg/dL   BUN 22  6 - 23 mg/dL   Creatinine, Ser 1.11 (*) 0.50 - 1.10 mg/dL   Calcium 8.4  8.4 - 10.5 mg/dL   Total Protein 5.9 (*) 6.0 - 8.3 g/dL   Albumin 2.1 (*) 3.5 - 5.2 g/dL   AST 6  0 - 37 U/L   ALT <5  0 - 35 U/L   Alkaline Phosphatase 60  39 - 117 U/L   Total Bilirubin 0.2 (*) 0.3 - 1.2 mg/dL   GFR calc non Af Amer 52 (*) >90 mL/min   GFR calc Af Amer 61 (*) >90 mL/min  MAGNESIUM     Status: Normal   Collection Time   04/09/12  5:03 AM      Component Value Range   Magnesium 2.1  1.5 - 2.5 mg/dL  PHOSPHORUS     Status: Normal   Collection Time   04/09/12  5:03 AM      Component Value Range   Phosphorus 3.6  2.3 - 4.6 mg/dL  CHOLESTEROL, TOTAL     Status: Normal   Collection Time   04/09/12  5:03 AM      Component Value Range   Cholesterol 92  0 - 200 mg/dL  TRIGLYCERIDES     Status: Normal   Collection Time   04/09/12  5:03 AM      Component Value Range   Triglycerides 146  <150 mg/dL  CBC     Status: Abnormal   Collection Time   04/09/12  5:03 AM      Component Value Range   WBC 10.4  4.0 - 10.5 K/uL   RBC 2.97 (*) 3.87 - 5.11 MIL/uL   Hemoglobin 8.8 (*) 12.0 - 15.0 g/dL   HCT 27.1 (*) 36.0 - 46.0 %   MCV 91.2  78.0 - 100.0 fL   MCH 29.6  26.0 - 34.0 pg   MCHC 32.5  30.0 - 36.0 g/dL   RDW 14.4  11.5 - 15.5 %   Platelets 277  150 - 400 K/uL  DIFFERENTIAL     Status: Abnormal   Collection Time   04/09/12  5:03 AM      Component Value Range   Neutrophils Relative 66  43 - 77 %   Neutro Abs 6.9  1.7 - 7.7 K/uL   Lymphocytes Relative 19  12 - 46 %   Lymphs Abs 2.0  0.7 - 4.0 K/uL   Monocytes Relative 11  3 - 12 %   Monocytes Absolute 1.2 (*) 0.1 - 1.0 K/uL   Eosinophils Relative 3  0 - 5 %   Eosinophils Absolute 0.3  0.0 - 0.7 K/uL   Basophils Relative 0  0 - 1 %   Basophils Absolute 0.0  0.0 - 0.1 K/uL  GLUCOSE, CAPILLARY     Status: Abnormal   Collection Time   04/09/12  7:50 AM      Component Value Range   Glucose-Capillary 118 (*) 70 - 99 mg/dL     MEDS, Scheduled    . aztreonam  1 g Intravenous Q8H  . benztropine  1 mg Oral BID  . chlorproMAZINE  50 mg Oral BID  . DAPTOmycin (CUBICIN)  IV  6 mg/kg Intravenous Q24H  . divalproex  125 mg Oral BID  . divalproex  250 mg Oral BID  . FLUoxetine  40 mg Oral Daily  . heparin  5,000 Units Subcutaneous Q8H  . metoprolol succinate  12.5 mg Oral Daily  . metronidazole  500 mg Intravenous Q8H  . pantoprazole (PROTONIX) IV  40 mg Intravenous QHS  . DISCONTD: clindamycin (CLEOCIN) IV  300 mg Intravenous Q8H  . DISCONTD: furosemide  20 mg Oral Daily  .  DISCONTD: furosemide  20 mg Oral QODAY  . DISCONTD: metoprolol succinate  12.5 mg Oral Daily  . DISCONTD: metoprolol succinate  25 mg Oral Daily  . DISCONTD: vancomycin  750 mg Intravenous Q12H     Assessment: Enterocutaneous fistula with draining abscess   Plan: Cont abx Cont TPN Ambulate, DVT prophylaxis  LOS: 2 days    Rosario Adie, MD [facility], [place] [phone]   04/09/2012 8:49 AM

## 2012-04-09 NOTE — Telephone Encounter (Signed)
Attempted to contact patient numerous times without success. She has not re-contacted the office. Will notify her if she does.

## 2012-04-09 NOTE — Consult Note (Signed)
WOC follow up  Wound type:ECF mid abdomen, pouched with Eakins pouch. Working well to contain the drainage. Dressing procedure/placement/frequency: New small Eakin in the room, would plan pouch change Monday unless needed prior to then.  Has good seal at time of my assessment.  I have educated pt on emptying the pouch frequently thru the day.  She will need encouragement from bedside staff on this. Explained that management with this pouch may be necessary for some time until any surgical intervention for the fistula can take place.   Upper midline wound has normal saline gauze dressing and appears to have less drainage today.  WOC will follow along with you for assistance with fistula management Gina [name] Gina [name], [facility]

## 2012-04-09 NOTE — Progress Notes (Signed)
PARENTERAL NUTRITION CONSULT NOTE - Follow Up  Pharmacy Consult for TNA Indication: Enterocutaneous fistula   Allergies  Allergen Reactions  . Penicillins   . Tetanus Toxoids     Patient Measurements: Height: 5\' 3"  (160 cm) Weight: 168 lb (76.204 kg) IBW/kg (Calculated) : 52.4  Adjusted Body Weight: 61.9   Vital Signs: Temp: 98.3 F (36.8 C) (09/06 0654) Temp src: Oral (09/06 0654) BP: 101/57 mmHg (09/06 0654) Pulse Rate: 57  (09/06 0654) Intake/Output from previous day: 09/05 0701 - 09/06 0700 In: 2491.7 [P.O.:30; I.V.:2301.7; TPN:160] Out: 1835 [Urine:1800] Intake/Output from this shift: Total I/O In: -  Out: 400 [Urine:400]  Labs:  [facility] 04/09/12 0503 04/08/12 0510 04/07/12 1640  WBC 10.4 8.5 10.2  HGB 8.8* 9.2* 9.2*  HCT 27.1* 28.8* 28.5*  PLT 277 246 255  APTT -- -- 35  INR -- -- 1.18     Basename 04/09/12 0503 04/08/12 0513 04/08/12 0510 04/07/12 1640 04/07/12 1000  NA 135 -- 136 -- 137  K 4.4 -- 4.4 -- 4.3  CL 101 -- 101 -- 99  CO2 25 -- 25 -- 25  GLUCOSE 117* -- 89 -- 86  BUN 22 -- 23 -- 26*  CREATININE 1.11* -- 1.28* 1.14* --  LABCREA -- -- -- -- --  CREAT24HRUR -- -- -- -- --  CALCIUM 8.4 -- 8.4 -- 9.4  MG 2.1 2.1 -- -- --  PHOS 3.6 4.5 -- -- --  PROT 5.9* -- -- -- 7.4  ALBUMIN 2.1* -- -- -- 2.7*  AST 6 -- -- -- 13  ALT <5 -- -- -- 7  ALKPHOS 60 -- -- -- 67  BILITOT 0.2* -- -- -- 0.2*  BILIDIR -- -- -- -- --  IBILI -- -- -- -- --  PREALBUMIN -- -- -- -- --  TRIG 146 -- -- -- --  CHOLHDL -- -- -- -- --  CHOL 92 -- -- -- --   Estimated Creatinine Clearance: 52 ml/min (by C-G formula based on Cr of 1.11).    Basename 04/09/12 0750  GLUCAP 118*    Medications:  Scheduled:     . aztreonam  1 g Intravenous Q8H  . benztropine  1 mg Oral BID  . chlorproMAZINE  50 mg Oral BID  . DAPTOmycin (CUBICIN)  IV  6 mg/kg Intravenous Q24H  . divalproex  125 mg Oral BID  . divalproex  250 mg Oral BID  . FLUoxetine  40 mg Oral Daily  .  heparin  5,000 Units Subcutaneous Q8H  . metoprolol succinate  12.5 mg Oral Daily  . metronidazole  500 mg Intravenous Q8H  . pantoprazole (PROTONIX) IV  40 mg Intravenous QHS  . DISCONTD: clindamycin (CLEOCIN) IV  300 mg Intravenous Q8H  . DISCONTD: furosemide  20 mg Oral Daily  . DISCONTD: furosemide  20 mg Oral QODAY  . DISCONTD: metoprolol succinate  12.5 mg Oral Daily  . DISCONTD: metoprolol succinate  25 mg Oral Daily  . DISCONTD: vancomycin  750 mg Intravenous Q12H   Infusions:     . 0.9 % NaCl with KCl 20 mEq / L 100 mL/hr at 04/08/12 0940  . 0.9 % NaCl with KCl 20 mEq / L    . TPN (CLINIMIX) +/- additives 40 mL/hr at 04/08/12 1858   PRN: acetaminophen, acetaminophen, diphenhydrAMINE, diphenhydrAMINE, HYDROcodone-acetaminophen, morphine injection, ondansetron, sodium chloride  Insulin Requirements in the past 24 hours:  No SSI ordered at this time CBGs q8h - only one so far =  118  Nutritional Goals:  Estimated nutritional Needs per RD note 9/5 Kcal: 1550-1800 Protein: 80-105 Fluid: 1.5-1.8L  Clinimix E 5/20 at Goal 70 ml/hr will provide 84 grams protein, 1478 KCals on nonlipid days and 1958 on lipid days providing an average of 1684 KCals/day/week.   Current Nutrition:  Diet: NPO, sips with meds, Ice Chips MIVF: NS with 20 mEq KCL/L @ 60 ml/hr  Assessment: Patient had elective ventral incisional hernia repair with mesh on 02/04/12.  Since hernia repair has had multiple surgical interventions for VRE/MRSA mesh infection (See surgeon note for details).  Now with Enterocutaneous Fistula found on admission, NPO and starting TNA per pharmacy   Labs:  Electrolytes: WNL,   Renal: Scr slightly elevated, but trending down. Mg and Phos wnl.  Liver: WNL  CBGs - f/u  Appears to be tolerating initiation of TNA.   Plan:  1) Advance Clinimix E 5/20 to goal rate of 70 ml/hr tonight at 18:00 2) Fat emulsion 20% at 10 ml/hr on MWF only (national backorder) 3.) Multivitamin  and trace elements on MWF only (national backorder) 4) Routine TNA labs on Mondays and Thursdays 5) CBG's q8h  6) Reduce MIFV to 30 ml/hr at 1800 tonight   7) BMET in am  Darrol Angel, PharmD Pager: [phone] 10:06 AM 04/09/2012

## 2012-04-09 NOTE — Progress Notes (Signed)
Clinical Social Work Department CLINICAL SOCIAL WORK PLACEMENT NOTE 04/09/2012  Patient:  Gina [name], Gina [name]  Account Number:  [id number] Admit date:  04/07/2012  Clinical Social Worker: Lia Foyer LCSWA  Date/time:  04/09/2012 03:11 PM  Clinical Social Work is seeking post-discharge placement for this patient at the following level of care:   SKILLED NURSING   (*CSW will update this form in Epic as items are completed)   04/09/2012  Patient/family provided with [facility] Department of Clinical Social Work's list of facilities offering this level of care within the geographic area requested by the patient (or if unable, by the patient's family).  04/09/2012  Patient/family informed of their freedom to choose among providers that offer the needed level of care, that participate in Medicare, Medicaid or managed care program needed by the patient, have an available bed and are willing to accept the patient.  04/09/2012  Patient/family informed of [facility]' ownership interest in [facility], as well as of the fact that they are under no obligation to receive care at this facility.  PASARR submitted to EDS on  PASARR number received from EDS on   FL2 transmitted to all facilities in geographic area requested by pt/family on  04/09/2012 FL2 transmitted to all facilities within larger geographic area on   Patient informed that his/her managed care company has contracts with or will negotiate with  certain facilities, including the following:     Patient/family informed of bed offers received:   Patient chooses bed at  Physician recommends and patient chooses bed at    Patient to be transferred to  on   Patient to be transferred to facility by   The following physician request were entered in Epic:   Additional Comments: [facility] Search  Lia Foyer, LCSWA [facility] Clinical Social Worker Contact #: [phone] (PRN)

## 2012-04-10 LAB — BASIC METABOLIC PANEL
Calcium: 8.5 mg/dL (ref 8.4–10.5)
Chloride: 104 mEq/L (ref 96–112)
Creatinine, Ser: 0.97 mg/dL (ref 0.50–1.10)
GFR calc Af Amer: 72 mL/min — ABNORMAL LOW (ref >90)
Sodium: 137 mEq/L (ref 135–145)

## 2012-04-10 LAB — WOUND CULTURE

## 2012-04-10 LAB — GLUCOSE, CAPILLARY
Glucose-Capillary: 123 mg/dL — ABNORMAL HIGH (ref 70–99)
Glucose-Capillary: 125 mg/dL — ABNORMAL HIGH (ref 70–99)

## 2012-04-10 LAB — MRSA PCR SCREENING: MRSA by PCR: NEGATIVE

## 2012-04-10 MED ORDER — INSULIN ASPART 100 UNIT/ML ~~LOC~~ SOLN
0.0000 [IU] | Freq: Three times a day (TID) | SUBCUTANEOUS | Status: DC
  Administered 2012-04-10: 1 [IU] via SUBCUTANEOUS

## 2012-04-10 MED ORDER — INSULIN ASPART 100 UNIT/ML ~~LOC~~ SOLN
0.0000 [IU] | SUBCUTANEOUS | Status: DC
  Administered 2012-04-10 – 2012-04-11 (×3): 1 [IU] via SUBCUTANEOUS

## 2012-04-10 MED ORDER — CLINIMIX E/DEXTROSE (5/20) 5 % IV SOLN
INTRAVENOUS | Status: AC
  Administered 2012-04-10: 18:00:00 via INTRAVENOUS
  Filled 2012-04-10: qty 2000

## 2012-04-10 NOTE — Progress Notes (Signed)
Pt alert x4 tol dressing change last night around 22:30. Bid wet to dry with packing and tunneling, no drainage or odor. Two 4x4 gauze used. Dressing then covered with dry gauze and Tegaderm. Will continue to monitor.

## 2012-04-10 NOTE — Progress Notes (Signed)
General Surgery Note  LOS: 3 days  Room - 3474  Assessment/Plan: 1.  Enterocutaneous fistula   On Flagyl.  On TPN.  Diet - NPO and ice chips.  Has pouch on fistula.     2.  Colon cancer - resected 2011 (about), but we have no info about stage or treatment - the patient is not a good historian.  Status post ventral hernia repair with mesh, 02/04/12, Dr. Para March in [place], [place].  Status post multiple surgeries  (02/24/2012 and 7/252013) for removal of infected mesh, nonhealing abdominal wound, now with enterocutaneous fistula all done in [place].  Moved to [place] about 2 weeks ago to live with sister.  3.  Sleep apnea , on CPAP 4.  HTN (hypertension), benign   5.  Bipolar 1 disorder  "schizoaffective disorder" in old chart  6.  Has bruise around right eye and chin from fall prior to this hospitalization. 7.  Poor dentition. 8.  History of MRSA and VRE in [place] - will nasal swab and isolate for now 9.  DVT - on SQ Heparin  Subjective:  Doing okay.  Not hurting except for some mild mid abdominal pain.  Needs to ambulate more.  Objective:   Filed Vitals:   04/10/12 0617  BP: 114/68  Pulse: 56  Temp: 98.1 F (36.7 C)  Resp: 18     Intake/Output from previous day:  09/06 0701 - 09/07 0700 In: 2070 [I.V.:1200; IV Piggyback:150; TPN:720] Out: 2870 [Urine:2700; Drains:150]  Intake/Output this shift:      Physical Exam:   General: Obese older WF who is alert and oriented. Somewhat hard to understand.  Is poor historian.   HEENT: Normal. Pupils equal. .   Lungs: Clear.   Abdomen: Soft.  BS.   Wound: Has Eiken's pouch over apparent fistula.  Has open wound above pouch.   Neurologic:  Grossly intact to motor and sensory function.  Not sure how strong she is. PT consulted.     Lab Results:    Basename 04/09/12 0503 04/08/12 0510  WBC 10.4 8.5  HGB 8.8* 9.2*  HCT 27.1* 28.8*  PLT 277 246    BMET   Basename 04/10/12 0450 04/09/12 0503  NA 137 135  K 4.9 4.4    CL 104 101  CO2 23 25  GLUCOSE 118* 117*  BUN 21 22  CREATININE 0.97 1.11*  CALCIUM 8.5 8.4    PT/INR   Basename 04/07/12 1640  LABPROT 15.3*  INR 1.18    ABG  No results found for this basename: PHART:2,PCO2:2,PO2:2,HCO3:2 in the last 72 hours   Studies/Results:  No results found.   Anti-infectives:   Anti-infectives     Start     Dose/Rate Route Frequency Ordered Stop   04/08/12 2200   aztreonam (AZACTAM) 1 g in dextrose 5 % 50 mL IVPB        1 g 100 mL/hr over 30 Minutes Intravenous 3 times per day 04/08/12 1845     04/08/12 2200   DAPTOmycin (CUBICIN) 457 mg in sodium chloride 0.9 % IVPB        6 mg/kg  76.2 kg 218.3 mL/hr over 30 Minutes Intravenous Every 24 hours 04/08/12 1900     04/07/12 2000   clindamycin (CLEOCIN) IVPB 300 mg  Status:  Discontinued        300 mg 100 mL/hr over 30 Minutes Intravenous Every 8 hours 04/07/12 1628 04/08/12 1845   04/07/12 1800   vancomycin (VANCOCIN) 750 mg in  sodium chloride 0.9 % 150 mL IVPB  Status:  Discontinued        750 mg 150 mL/hr over 60 Minutes Intravenous Every 12 hours 04/07/12 1710 04/08/12 1845   04/07/12 1700   metroNIDAZOLE (FLAGYL) IVPB 500 mg        500 mg 100 mL/hr over 60 Minutes Intravenous Every 8 hours 04/07/12 1628     04/07/12 1545   metroNIDAZOLE (FLAGYL) IVPB 500 mg  Status:  Discontinued        500 mg 100 mL/hr over 60 Minutes Intravenous  Once 04/07/12 1539 04/07/12 1541   04/07/12 1530   metroNIDAZOLE (FLAGYL) IVPB 500 mg        500 mg 100 mL/hr over 60 Minutes Intravenous  Once 04/07/12 1519 04/07/12 1647   04/07/12 1000   clindamycin (CLEOCIN) IVPB 600 mg        600 mg 100 mL/hr over 30 Minutes Intravenous  Once 04/07/12 0948 04/07/12 1245          Alphonsa Overall, MD, [place] Pager: [phone],   [place] [facility] Office: [phone] 04/10/2012

## 2012-04-10 NOTE — Progress Notes (Addendum)
Placed pt on cpap for rest.  Pt is using her cpap unit and full face mask from home and tolerating well at this time.  Pt's machine appears to be working fine, no frays on cord or defects noted at this time.  Call made to Service response and request made to have [employer] inspect pt's machine.  Water chamber full, no additional water added at this time.

## 2012-04-10 NOTE — Evaluation (Signed)
Physical Therapy Evaluation Patient Details Name: Gina [name] MRN: [id number] DOB: 06/26/1951 Today's Date: 04/10/2012 Time: 1030-1055 PT Time Calculation (min): 25 min  PT Assessment / Plan / Recommendation Clinical Impression  61 y.o. female with h/o colon cancer and Status post multiple surgeries  (02/24/2012 and 7/252013) for removal of infected mesh, nonhealing abdominal wound, now with enterocutaneous fistula all done in [place].  Moved to [place] about 2 weeks ago to live with sister.Pt admitted with enterocutaneous fistula, AMS, fall. Noted h/o bipolar d/o. Pt poor historain, but stated she walked with a RW at home. Pt ambulated 65' with RW and min assist to negotiate turns/obstacles. Noted plan in chart is to DC [facility].  Pt would benefit from acute PT to maximize safety and independence with mobility.    PT Assessment  Patient needs continued PT services    Follow Up Recommendations  Skilled nursing facility    Barriers to Discharge        Equipment Recommendations  Defer to next venue    Recommendations for Other Services     Frequency Min 3X/week    Precautions / Restrictions Precautions Precautions: Fall Precaution Comments: h/o falls Restrictions Weight Bearing Restrictions: No   Pertinent Vitals/Pain *pt reported "just a little pain" in stomach, then rated it 9/10, a few minutes later she reported no pain No signs/symptoms of pain noted**      Mobility  Bed Mobility Bed Mobility: Not assessed Transfers Transfers: Sit to Stand;Stand to Sit Sit to Stand: 4: Min guard;From chair/3-in-1;With upper extremity assist Stand to Sit: 4: Min guard;To chair/3-in-1;With upper extremity assist Details for Transfer Assistance: min/guard for safety 2* h/o falls Ambulation/Gait Ambulation/Gait Assistance: 4: Min guard;4: Min Environmental consultant (Feet): 80 Feet Assistive device: Rolling walker Ambulation/Gait Assistance Details: min A to navigate turns  and obstacles with RW Gait Pattern: Step-through pattern Gait velocity: decreased General Gait Details: no LOB    Exercises     PT Diagnosis: Generalized weakness  PT Problem List: Decreased safety awareness;Decreased activity tolerance PT Treatment Interventions: Gait training;Functional mobility training;Therapeutic exercise;Balance training;Patient/family education   PT Goals Acute Rehab PT Goals PT Goal Formulation: With patient Time For Goal Achievement: 04/10/12 Potential to Achieve Goals: Good Pt will go Supine/Side to Sit: with supervision;with HOB 0 degrees PT Goal: Supine/Side to Sit - Progress: Goal set today Pt will go Sit to Stand: with supervision;with upper extremity assist PT Goal: Sit to Stand - Progress: Goal set today Pt will Ambulate: 51 - 150 feet;with supervision;with rolling walker PT Goal: Ambulate - Progress: Goal set today  Visit Information  Last PT Received On: 04/10/12 Assistance Needed: +1    Subjective Data  Subjective: I forget we're not in [place].  Patient Stated Goal: none stated   Prior Functioning  Home Living Lives With: Family Available Help at Discharge: Skilled Nursing Facility Prior Function Level of Independence: Independent with assistive device(s) Communication Communication: No difficulties    Cognition  Overall Cognitive Status: Impaired Area of Impairment: Problem solving Arousal/Alertness: Awake/alert Orientation Level: Time;Situation Behavior During Session: [facility] for tasks performed Problem Solving: requires assist to negotiate turns/obstacles with RW Cognition - Other Comments: oriented to self, not to year, can follow directions, poor historian    Extremity/Trunk Assessment Right Upper Extremity Assessment RUE ROM/Strength/Tone: [facility] for tasks assessed Left Upper Extremity Assessment LUE ROM/Strength/Tone: WFL for tasks assessed Right Lower Extremity Assessment RLE ROM/Strength/Tone: Within functional  levels RLE Sensation: WFL - Light Touch RLE Coordination: WFL - gross/fine motor Left  Lower Extremity Assessment LLE ROM/Strength/Tone: Within functional levels LLE Sensation: WFL - Light Touch LLE Coordination: WFL - gross/fine motor Trunk Assessment Trunk Assessment: Normal   Balance Balance Balance Assessed: Yes Static Standing Balance Static Standing - Balance Support: During functional activity;Left upper extremity supported Static Standing - Level of Assistance: 5: Stand by assistance Static Standing - Comment/# of Minutes: 2 minutes while washing hands and putting in dentures at sink  End of Session PT - End of Session Patient left: in chair;with call bell/phone within reach Nurse Communication: Mobility status  GP     Ralene Bathe Kistler 04/10/2012, 11:06 AM [phone]

## 2012-04-10 NOTE — Progress Notes (Signed)
[facility] PROGRESS NOTE  Gina [name] ZOX:[id number] DOB: [birth year] DOA: 04/07/2012 PCP: Eustaquio Boyden, MD  Assessment/Plan: Principal Problem:  *Enterocutaneous fistula Active Problems:  Colon cancer  Sleep apnea  HTN (hypertension), benign  Bipolar 1 disorder  1. Hypertension- Low BP this am, improved on recheck  BPs continue hold parameters for metoprolol, follow  -admit EKG reviewed no acute ischemic changes.  2. Bipolar disorder-stable, continue outpatient medications.  3. enterocutaneous fistula/abscess-per surgery/primary team  4. status post fall, likely mechanical- no reports of syncope.  5. anemia, normocytic-likely chronic disease- no significant change, follow. 6.OSA- continue CPAP  Brief narrative: Pt is a 66  with history of hypertension, bipolar disorder, depression, sleep apnea, renal insufficiency , history of colon cancer-status post partial colectomy in the past, and status post ventral hernia repair complicated by infection and subsequent multiple surgeries admitted to [facility] s/p fall and with drainage from abdominal wound. Medicine consulted for medical management.    HPI/Subjective: Pt awake, cpap on, denies CP, no SOB.  Objective: Filed Vitals:   04/09/12 2215 04/10/12 0617 04/10/12 1019 04/10/12 1400  BP: 125/68 114/68 117/59 122/73  Pulse: 50 56 60 59  Temp: 97.7 F (36.5 C) 98.1 F (36.7 C)  97.9 F (36.6 C)  TempSrc: Axillary Oral  Oral  Resp: 17 18  16   Height:      Weight:      SpO2: 100% 96%  96%    Intake/Output Summary (Last 24 hours) at 04/10/12 1559 Last data filed at 04/10/12 1401  Gross per 24 hour  Intake   1303 ml  Output   2620 ml  Net  -1317 ml   Filed Weights   04/07/12 1656  Weight: 76.204 kg (168 lb)    Exam:   General:  Obese elderly female in NAD, CPAP on  Cardiovascular: RRR, nl s1s2  Respiratory: clear, no crackles, no wheezes  Abdomen:soft+BS drainage bag in place with brownish  liquid.  Data Reviewed: Basic Metabolic Panel:  Lab 04/10/12 8119 04/09/12 0503 04/08/12 0513 04/08/12 0510 04/07/12 1640 04/07/12 1000  NA 137 135 -- 136 -- 137  K 4.9 4.4 -- 4.4 -- 4.3  CL 104 101 -- 101 -- 99  CO2 23 25 -- 25 -- 25  GLUCOSE 118* 117* -- 89 -- 86  BUN 21 22 -- 23 -- 26*  CREATININE 0.97 1.11* -- 1.28* 1.14* 1.17*  CALCIUM 8.5 8.4 -- 8.4 -- 9.4  MG -- 2.1 2.1 -- -- --  PHOS -- 3.6 4.5 -- -- --   Liver Function Tests:  Lab 04/09/12 0503 04/07/12 1000  AST 6 13  ALT <5 7  ALKPHOS 60 67  BILITOT 0.2* 0.2*  PROT 5.9* 7.4  ALBUMIN 2.1* 2.7*    Lab 04/07/12 1000  LIPASE 9*  AMYLASE --   No results found for this basename: AMMONIA:5 in the last 168 hours CBC:  Lab 04/09/12 0503 04/08/12 0510 04/07/12 1640 04/07/12 1000  WBC 10.4 8.5 10.2 11.0*  NEUTROABS 6.9 -- -- 8.0*  HGB 8.8* 9.2* 9.2* 10.4*  HCT 27.1* 28.8* 28.5* 31.8*  MCV 91.2 92.3 90.5 90.9  PLT 277 246 255 PLATELET CLUMPS NOTED ON SMEAR, COUNT APPEARS ADEQUATE   Cardiac Enzymes:  Lab 04/08/12 1930 04/07/12 1300 04/07/12 1000  CKTOTAL 37 -- --  CKMB -- -- --  CKMBINDEX -- -- --  TROPONINI -- <0.30 <0.30   BNP (last 3 results) No results found for this basename: PROBNP:3 in the last 8760 hours CBG:  Lab 04/10/12 1208 04/10/12 0800 04/10/12 0129 04/09/12 2009 04/09/12 1850  GLUCAP 117* 135* 156* 126* 129*    Recent Results (from the past 240 hour(s))  WOUND CULTURE     Status: Normal (Preliminary result)   Collection Time   04/07/12  2:16 PM      Component Value Range Status Comment   Specimen Description ABDOMEN   Final    Special Requests NONE   Final    Gram Stain     Final    Value: RARE WBC PRESENT,BOTH PMN AND MONONUCLEAR     NO SQUAMOUS EPITHELIAL CELLS SEEN     MODERATE GRAM POSITIVE RODS     RARE GRAM NEGATIVE RODS     RARE GRAM POSITIVE COCCI   Culture     Final    Value: FEW STAPHYLOCOCCUS AUREUS     Note: RIFAMPIN AND GENTAMICIN SHOULD NOT BE USED AS SINGLE DRUGS FOR  TREATMENT OF STAPH INFECTIONS.   Report Status PENDING   Incomplete   WOUND CULTURE     Status: Normal   Collection Time   04/07/12  2:16 PM      Component Value Range Status Comment   Specimen Description ABDOMEN   Final    Special Requests NONE   Final    Gram Stain     Final    Value: ABUNDANT WBC PRESENT,BOTH PMN AND MONONUCLEAR     NO SQUAMOUS EPITHELIAL CELLS SEEN     MODERATE GRAM POSITIVE RODS   Culture     Final    Value: MULTIPLE ORGANISMS PRESENT, NONE PREDOMINANT     Note: NO STAPHYLOCOCCUS AUREUS ISOLATED NO GROUP A STREP (S.PYOGENES) ISOLATED   Report Status 04/10/2012 FINAL   Final      Studies: Dg Chest 2 View  04/07/2012  *RADIOLOGY REPORT*  Clinical Data: Weakness.  CHEST - 2 VIEW  Comparison: None.  Findings: Lung volumes are low. The cardiopericardial silhouette is enlarged.  No focal airspace consolidation or airspace pulmonary edema. Imaged bony structures of the thorax are intact. Telemetry leads overlie the chest.  IMPRESSION: Low volume film with cardiomegaly.  No acute cardiopulmonary findings.   Original Report Authenticated By: ERIC A. MANSELL, M.D.    Ct Head Wo Contrast  04/07/2012  *RADIOLOGY REPORT*  Clinical Data:  Status post fall, right orbital/chin bruising/hematoma  CT HEAD WITHOUT CONTRAST CT MAXILLOFACIAL WITHOUT CONTRAST  Technique:  Multidetector CT imaging of the head and maxillofacial structures were performed using the standard protocol without intravenous contrast. Multiplanar CT image reconstructions of the maxillofacial structures were also generated.  Comparison:  None.  CT HEAD  Findings: No evidence of parenchymal hemorrhage or extra-axial fluid collection. No mass lesion, mass effect, or midline shift.  No CT evidence of acute infarction.  Mild global cortical atrophy.  No ventriculomegaly.  Calcifications in the right basal ganglia and cerebellum.  No evidence of calvarial fracture.  IMPRESSION: No evidence of acute intracranial abnormality.  CT  MAXILLOFACIAL  Findings:   No evidence of maxillofacial fracture.  The visualized paranasal sinuses are essentially clear. The mastoid air cells are unopacified.  The bilateral orbits, including the retroconal soft tissues, are within normal limits.  Mild soft tissue swelling/bruising overlying the right maxilla (series 4/image 28).  The mandible, including the mandibular condyles, are within normal limits.  Degenerative changes of the cervical spine to C4-5.  IMPRESSION: Mild soft tissue swelling/bruising overlying the right maxilla.  No evidence of maxillofacial fracture.   Original Report Authenticated  By: Charline Bills, M.D.    Ct Abdomen Pelvis W Contrast  04/07/2012  *RADIOLOGY REPORT*  Clinical Data: Abdominal pain.  Status post recent hernia repair. Possible wound infection.  CT ABDOMEN AND PELVIS WITH CONTRAST  Technique:  Multidetector CT imaging of the abdomen and pelvis was performed following the standard protocol during bolus administration of intravenous contrast.  Contrast: OMNIPAQUE IOHEXOL 300 MG/ML  SOLN  Comparison: No priors.  Findings:  Lung Bases: Cardiomegaly.  Otherwise, unremarkable.  Abdomen/Pelvis:  The enhanced appearance of the liver, gallbladder, pancreas, spleen and bilateral adrenal glands is unremarkable. There is mild multifocal parenchymal thinning in the kidneys, most compatible with scarring.  Additionally, there are multiple tiny subcentimeter low attenuation lesions in the kidneys bilaterally which are too small to definitively characterize, but are statistically likely to represent small cysts.  The largest of these measures 9 mm in the upper pole of the right kidney.  There are postoperative changes in the transverse colon, suggesting prior partial colectomy.  The patient is also status post hysterectomy and left salpingo-oophorectomy.  A 2.7 x 1.9 cm partially calcified soft tissue attenuation (48 HU) lesion in the right adnexa is likely ovarian in etiology.  No  significant volume of ascites.  No pneumoperitoneum.  No pathologic distension of small bowel.  There are numerous small bowel loops which appear intimately associated with the anterior abdominal wall.  Musculoskeletal: In the subcutaneous fat of the anterior abdominal wall immediately above and around the umbilicus there is a large rim enhancing fluid collection that measures approximately 10.7 x 7.2 x 2.0 cm, concerning for a superficial abscess.  At the level of the umbilicus, there appears to be a frank defect in the subcutaneous soft tissues of the abdomen which extends to the underlying abdominal wall musculature, and in this region there is pooling of the oral contrast material.  This is indicative of an enterocutaneous fistula (despite the fact that no definite fistulous tract is clearly identified on this examination).  There are no aggressive appearing lytic or blastic lesions noted in the visualized portions of the skeleton.  IMPRESSION: 1.  Findings, as above, compatible with an enterocutaneous fistula, likely from the small bowel, which extends to the body surface at the level of the umbilicus. 2.  In addition, there is a large abscess in the anterior abdominal wall subcutaneous fat immediately above the umbilicus, as above. 3.  2.7 x 1.9 cm soft tissue attenuation lesion in the right adnexa is likely ovarian in etiology.  This could be better evaluated with a non emergent transvaginal ultrasound to exclude neoplasm. 4.  Status post partial colectomy in the mid transverse colon.  The patient is also status post hysterectomy and left oophorectomy. 5.  Additional incidental findings, as above.   Original Report Authenticated By: Florencia Reasons, M.D.    Ct Maxillofacial Wo Cm  04/07/2012  *RADIOLOGY REPORT*  Clinical Data:  Status post fall, right orbital/chin bruising/hematoma  CT HEAD WITHOUT CONTRAST CT MAXILLOFACIAL WITHOUT CONTRAST  Technique:  Multidetector CT imaging of the head and  maxillofacial structures were performed using the standard protocol without intravenous contrast. Multiplanar CT image reconstructions of the maxillofacial structures were also generated.  Comparison:  None.  CT HEAD  Findings: No evidence of parenchymal hemorrhage or extra-axial fluid collection. No mass lesion, mass effect, or midline shift.  No CT evidence of acute infarction.  Mild global cortical atrophy.  No ventriculomegaly.  Calcifications in the right basal ganglia and cerebellum.  No  evidence of calvarial fracture.  IMPRESSION: No evidence of acute intracranial abnormality.  CT MAXILLOFACIAL  Findings:   No evidence of maxillofacial fracture.  The visualized paranasal sinuses are essentially clear. The mastoid air cells are unopacified.  The bilateral orbits, including the retroconal soft tissues, are within normal limits.  Mild soft tissue swelling/bruising overlying the right maxilla (series 4/image 28).  The mandible, including the mandibular condyles, are within normal limits.  Degenerative changes of the cervical spine to C4-5.  IMPRESSION: Mild soft tissue swelling/bruising overlying the right maxilla.  No evidence of maxillofacial fracture.   Original Report Authenticated By: Charline Bills, M.D.     Scheduled Meds:    . aztreonam  1 g Intravenous Q8H  . benztropine  1 mg Oral BID  . chlorproMAZINE  50 mg Oral BID  . DAPTOmycin (CUBICIN)  IV  6 mg/kg Intravenous Q24H  . divalproex  125 mg Oral BID  . divalproex  250 mg Oral BID  . FLUoxetine  40 mg Oral Daily  . heparin  5,000 Units Subcutaneous Q8H  . insulin aspart  0-9 Units Subcutaneous Q4H  . metoprolol succinate  12.5 mg Oral Daily  . metronidazole  500 mg Intravenous Q8H  . pantoprazole (PROTONIX) IV  40 mg Intravenous QHS  . DISCONTD: insulin aspart  0-9 Units Subcutaneous Q8H   Continuous Infusions:    . 0.9 % NaCl with KCl 20 mEq / L 60 mL/hr (04/10/12 1405)  . fat emulsion 240 mL (04/10/12 0618)  . TPN  (CLINIMIX) +/- additives 40 mL/hr at 04/08/12 1858  . TPN (CLINIMIX) +/- additives 70 mL/hr at 04/09/12 1814  . TPN (CLINIMIX) +/- additives      Principal Problem:  *Enterocutaneous fistula Active Problems:  Colon cancer  Sleep apnea  HTN (hypertension), benign  Bipolar 1 disorder    Time spent:    Darlette Dubow C  [facility] Pager [phone] If 8PM-8AM, please contact night-coverage at [email or web address], password [email or web address] 04/10/2012, 3:59 PM  LOS: 3 days

## 2012-04-10 NOTE — Progress Notes (Signed)
PARENTERAL NUTRITION CONSULT NOTE - Follow Up  Pharmacy Consult for TNA Indication: Enterocutaneous fistula   Allergies  Allergen Reactions  . Penicillins   . Tetanus Toxoids     Patient Measurements: Height: 5\' 3"  (160 cm) Weight: 168 lb (76.204 kg) IBW/kg (Calculated) : 52.4  Adjusted Body Weight: 61.9   Vital Signs: Temp: 98.1 F (36.7 C) (09/07 0617) Temp src: Oral (09/07 0617) BP: 114/68 mmHg (09/07 0617) Pulse Rate: 56  (09/07 0617) Intake/Output from previous day: 09/06 0701 - 09/07 0700 In: 2070 [I.V.:1200; IV Piggyback:150; TPN:720] Out: 2870 [Urine:2700; Drains:150] Intake/Output from this shift:    Labs:  [facility] 04/09/12 0503 04/08/12 0510 04/07/12 1640  WBC 10.4 8.5 10.2  HGB 8.8* 9.2* 9.2*  HCT 27.1* 28.8* 28.5*  PLT 277 246 255  APTT -- -- 35  INR -- -- 1.18     Basename 04/10/12 0450 04/09/12 0503 04/08/12 0513 04/08/12 0510 04/07/12 1000  NA 137 135 -- 136 --  K 4.9 4.4 -- 4.4 --  CL 104 101 -- 101 --  CO2 23 25 -- 25 --  GLUCOSE 118* 117* -- 89 --  BUN 21 22 -- 23 --  CREATININE 0.97 1.11* -- 1.28* --  LABCREA -- -- -- -- --  CREAT24HRUR -- -- -- -- --  CALCIUM 8.5 8.4 -- 8.4 --  MG -- 2.1 2.1 -- --  PHOS -- 3.6 4.5 -- --  PROT -- 5.9* -- -- 7.4  ALBUMIN -- 2.1* -- -- 2.7*  AST -- 6 -- -- 13  ALT -- <5 -- -- 7  ALKPHOS -- 60 -- -- 67  BILITOT -- 0.2* -- -- 0.2*  BILIDIR -- -- -- -- --  IBILI -- -- -- -- --  PREALBUMIN -- 9.8* -- -- --  TRIG -- 146 -- -- --  CHOLHDL -- -- -- -- --  CHOL -- 92 -- -- --   Estimated Creatinine Clearance: 59.5 ml/min (by C-G formula based on Cr of 0.97).    Basename 04/10/12 0129 04/09/12 2009 04/09/12 1850  GLUCAP 156* 126* 129*    Medications:  Scheduled:     . aztreonam  1 g Intravenous Q8H  . benztropine  1 mg Oral BID  . chlorproMAZINE  50 mg Oral BID  . DAPTOmycin (CUBICIN)  IV  6 mg/kg Intravenous Q24H  . divalproex  125 mg Oral BID  . divalproex  250 mg Oral BID  . FLUoxetine   40 mg Oral Daily  . heparin  5,000 Units Subcutaneous Q8H  . metoprolol succinate  12.5 mg Oral Daily  . metronidazole  500 mg Intravenous Q8H  . pantoprazole (PROTONIX) IV  40 mg Intravenous QHS   Infusions:     . 0.9 % NaCl with KCl 20 mEq / L 60 mL/hr at 04/10/12 0614  . fat emulsion 240 mL (04/10/12 0618)  . TPN (CLINIMIX) +/- additives 40 mL/hr at 04/08/12 1858  . TPN (CLINIMIX) +/- additives 70 mL/hr at 04/09/12 1814   PRN: acetaminophen, acetaminophen, diphenhydrAMINE, diphenhydrAMINE, HYDROcodone-acetaminophen, morphine injection, ondansetron, sodium chloride  Insulin Requirements in the past 24 hours:  No SSI ordered.   Nutritional Goals:  Estimated nutritional Needs per RD note 9/5 Kcal: 1550-1800 Protein: 80-105 Fluid: 1.5-1.8L  Clinimix E 5/20 at Goal 70 ml/hr will provide 84 grams protein, 1478 KCals on nonlipid days and 1958 on lipid days providing an average of 1684 KCals/day/week.   Current Nutrition:  Clinimix E 5/20 at 70 ml/hr. Diet: NPO,  sips with meds, Ice Chips MIVF: NS with 20 mEq KCL/L @ 60 ml/hr  Assessment: Patient had elective ventral incisional hernia repair with mesh on 02/04/12.  Since hernia repair has had multiple surgical interventions for VRE/MRSA mesh infection (See surgeon note for details).  Now with Enterocutaneous Fistula found on admission, NPO and started TNA per pharmacy   Labs:  Electrolytes: wnl   Renal: Scr was slightly elevated, but improved.  Liver: wnl  CBGs: controlled  TGs: wnl  Prealbumin: 9.8  Plan:  1) Continue Clinimix E 5/20 at goal rate of 70 ml/hr. 2) Fat emulsion 20% at 10 ml/hr on MWF only (national backorder) 3.) Multivitamin and trace elements on MWF only (national backorder) 4) Routine TNA labs on Mondays and Thursdays 5) Add SSI sensitive q8h. 6) BMET in am  Charolotte Eke, PharmD, pager [phone]. 04/10/2012,7:31 AM.

## 2012-04-10 NOTE — Progress Notes (Signed)
CSW spoke with the niece concerning bed offers. At this time there are no offers in the [place] area.   Niece would still prefer [facility] and would like to wait to see if any offers come in for that area.  CSW will reassess for updated bed offers tomorrow.   Gina [name], LCSWA [employer] [phone]

## 2012-04-11 LAB — BASIC METABOLIC PANEL
CO2: 24 mEq/L (ref 19–32)
Chloride: 106 mEq/L (ref 96–112)
Sodium: 137 mEq/L (ref 135–145)

## 2012-04-11 LAB — WOUND CULTURE

## 2012-04-11 LAB — GLUCOSE, CAPILLARY: Glucose-Capillary: 117 mg/dL — ABNORMAL HIGH (ref 70–99)

## 2012-04-11 MED ORDER — INSULIN ASPART 100 UNIT/ML ~~LOC~~ SOLN
0.0000 [IU] | Freq: Four times a day (QID) | SUBCUTANEOUS | Status: DC
  Administered 2012-04-11 – 2012-04-13 (×5): 1 [IU] via SUBCUTANEOUS
  Administered 2012-04-13: 2 [IU] via SUBCUTANEOUS

## 2012-04-11 MED ORDER — CLINIMIX E/DEXTROSE (5/20) 5 % IV SOLN
INTRAVENOUS | Status: AC
  Administered 2012-04-11: 18:00:00 via INTRAVENOUS
  Filled 2012-04-11: qty 2000

## 2012-04-11 MED ORDER — ADULT MULTIVITAMIN W/MINERALS CH
1.0000 | ORAL_TABLET | Freq: Every day | ORAL | Status: DC
  Administered 2012-04-11 – 2012-04-13 (×3): 1 via ORAL
  Filled 2012-04-11 (×3): qty 1

## 2012-04-11 NOTE — Progress Notes (Signed)
Placed pt on her cpap unit and full face mask from home.  Pt is tolerating well at this time.  Sterile water added to chamber for humidity.  RN notified.

## 2012-04-11 NOTE — Progress Notes (Signed)
Report of Mrsa in wound  called to [facility]. I paged Dr Ezzard Standing and pt placed on contact precautions -no change to antibiotics. Also reported bp=105/72 and pulse of 57 to him. Instructed to hold 10 am dose of toprol.

## 2012-04-11 NOTE — Progress Notes (Signed)
PARENTERAL NUTRITION CONSULT NOTE - Follow Up  Pharmacy Consult for TNA Indication: Enterocutaneous fistula   Allergies  Allergen Reactions  . Penicillins   . Tetanus Toxoids     Patient Measurements: Height: 5\' 3"  (160 cm) Weight: 168 lb (76.204 kg) IBW/kg (Calculated) : 52.4  Adjusted Body Weight: 61.9   Vital Signs: Temp: 97.8 F (36.6 C) (09/08 0626) Temp src: Oral (09/08 0626) BP: 132/75 mmHg (09/08 0626) Pulse Rate: 80  (09/08 0626) Intake/Output from previous day: 09/07 0701 - 09/08 0700 In: 1422 [I.V.:1422] Out: 2370 [Urine:2350; Drains:20] Intake/Output from this shift:    Labs:  [facility] 04/09/12 0503  WBC 10.4  HGB 8.8*  HCT 27.1*  PLT 277  APTT --  INR --     Basename 04/11/12 0500 04/10/12 0450 04/09/12 0503  NA 137 137 135  K 4.9 4.9 4.4  CL 106 104 101  CO2 24 23 25   GLUCOSE 127* 118* 117*  BUN 23 21 22   CREATININE 0.95 0.97 1.11*  LABCREA -- -- --  CREAT24HRUR -- -- --  CALCIUM 8.6 8.5 8.4  MG -- -- 2.1  PHOS -- -- 3.6  PROT -- -- 5.9*  ALBUMIN -- -- 2.1*  AST -- -- 6  ALT -- -- <5  ALKPHOS -- -- 60  BILITOT -- -- 0.2*  BILIDIR -- -- --  IBILI -- -- --  PREALBUMIN -- -- 9.8*  TRIG -- -- 146  CHOLHDL -- -- --  CHOL -- -- 92   Estimated Creatinine Clearance: 60.8 ml/min (by C-G formula based on Cr of 0.95).    Basename 04/11/12 0408 04/11/12 0011 04/10/12 2030  GLUCAP 117* 131* 125*   Medications:  Scheduled:     . aztreonam  1 g Intravenous Q8H  . benztropine  1 mg Oral BID  . chlorproMAZINE  50 mg Oral BID  . DAPTOmycin (CUBICIN)  IV  6 mg/kg Intravenous Q24H  . divalproex  125 mg Oral BID  . divalproex  250 mg Oral BID  . FLUoxetine  40 mg Oral Daily  . heparin  5,000 Units Subcutaneous Q8H  . insulin aspart  0-9 Units Subcutaneous Q4H  . metoprolol succinate  12.5 mg Oral Daily  . metronidazole  500 mg Intravenous Q8H  . pantoprazole (PROTONIX) IV  40 mg Intravenous QHS  . DISCONTD: insulin aspart  0-9 Units  Subcutaneous Q8H   Infusions:     . 0.9 % NaCl with KCl 20 mEq / L 60 mL/hr (04/10/12 1405)  . fat emulsion 240 mL (04/10/12 0618)  . TPN (CLINIMIX) +/- additives 70 mL/hr at 04/09/12 1814  . TPN (CLINIMIX) +/- additives 70 mL/hr at 04/10/12 1743   PRN: acetaminophen, acetaminophen, diphenhydrAMINE, diphenhydrAMINE, HYDROcodone-acetaminophen, morphine injection, ondansetron, sodium chloride  Insulin Requirements in the past 24 hours:  4units sensitive SSI q4h.  Nutritional Goals:  Estimated nutritional Needs per RD note 9/5 Kcal: 1550-1800 Protein: 80-105 Fluid: 1.5-1.8L  Clinimix E 5/20 at Goal 70 ml/hr will provide 84 grams protein, 1478 KCals on nonlipid days and 1958 on lipid days providing an average of 1684 KCals/day/week.   Current Nutrition:  Clinimix E 5/20 at 70 ml/hr. Diet: NPO, sips with meds, Ice Chips MIVF: NS with 20 mEq KCL/L @ 60 ml/hr  Assessment: Patient had elective ventral incisional hernia repair with mesh on 02/04/12.  Since hernia repair has had multiple surgical interventions for VRE/MRSA mesh infection (See surgeon note for details).  Now with Enterocutaneous Fistula found on admission, NPO  and started TNA per pharmacy   Labs:  Electrolytes: wnl, stable.  Renal: Scr improved.  Liver: wnl (9/6)  CBGs: controlled  TGs: wnl (9/6)  Prealbumin: 9.8 (9/6)  Plan:  1) Continue Clinimix E 5/20 at goal rate of 70 ml/hr. 2) Fat emulsion 20% at 10 ml/hr on MWF only (national backorder) 3) Since tolerating PO meds, we will provide MVI and trace elements via the enteral route d/t ongoing shortages of the injectable dosage forms. 4) Routine TNA labs on Mondays and Thursdays 5) Cont SSI but change CBGs to q6h.  Charolotte Eke, PharmD, pager [phone]. 04/11/2012,7:25 AM.

## 2012-04-11 NOTE — Progress Notes (Signed)
[facility] PROGRESS NOTE  Paityn Balsam ZOX:[id number] DOB: 1950-08-09 DOA: 04/07/2012 PCP: Eustaquio Boyden, MD  Assessment/Plan: Principal Problem:  *Enterocutaneous fistula Active Problems:  Colon cancer  Sleep apnea  HTN (hypertension), benign  Bipolar 1 disorder  1. Hypertension- BP stable today follow and increase back home meds as appropriate. -admit EKG reviewed no acute ischemic changes.  2. Bipolar disorder-stable, continue outpatient medications.  3. enterocutaneous fistula/abscess-per surgery/primary team  4. status post fall, likely mechanical- no reports of syncope.  5. anemia, normocytic-likely chronic disease- no significant change, follow. 6.OSA- continue CPAP  Brief narrative: Pt is a 81  with history of hypertension, bipolar disorder, depression, sleep apnea, renal insufficiency , history of colon cancer-status post partial colectomy in the past, and status post ventral hernia repair complicated by infection and subsequent multiple surgeries admitted to [facility]/p fall and with drainage from abdominal wound. Medicine consulted for medical management.    HPI/Subjective: Alert and sitting in chair, states she walked around unit x4 today. She denies any c/o Objective: Filed Vitals:   04/10/12 2220 04/11/12 0626 04/11/12 1101 04/11/12 1400  BP: 131/76 132/75 105/72 153/69  Pulse: 56 80 57 56  Temp: 98.4 F (36.9 C) 97.8 F (36.6 C)  97.7 F (36.5 C)  TempSrc: Oral Oral  Oral  Resp: 20 18  16   Height:      Weight:      SpO2: 97% 98%  98%    Intake/Output Summary (Last 24 hours) at 04/11/12 2035 Last data filed at 04/11/12 1808  Gross per 24 hour  Intake   1448 ml  Output   2227 ml  Net   -779 ml   Filed Weights   04/07/12 1656  Weight: 76.204 kg (168 lb)    Exam:   General:  Obese elderly female in NAD, CPAP on  Cardiovascular: RRR, nl s1s2  Respiratory: clear, no crackles, no wheezes  Abdomen:soft+BS drainage bag in  place  Data Reviewed: Basic Metabolic Panel:  Lab 04/11/12 8119 04/10/12 0450 04/09/12 0503 04/08/12 0513 04/08/12 0510 04/07/12 1640 04/07/12 1000  NA 137 137 135 -- 136 -- 137  K 4.9 4.9 4.4 -- 4.4 -- 4.3  CL 106 104 101 -- 101 -- 99  CO2 24 23 25  -- 25 -- 25  GLUCOSE 127* 118* 117* -- 89 -- 86  BUN 23 21 22  -- 23 -- 26*  CREATININE 0.95 0.97 1.11* -- 1.28* 1.14* --  CALCIUM 8.6 8.5 8.4 -- 8.4 -- 9.4  MG -- -- 2.1 2.1 -- -- --  PHOS -- -- 3.6 4.5 -- -- --   Liver Function Tests:  Lab 04/09/12 0503 04/07/12 1000  AST 6 13  ALT <5 7  ALKPHOS 60 67  BILITOT 0.2* 0.2*  PROT 5.9* 7.4  ALBUMIN 2.1* 2.7*    Lab 04/07/12 1000  LIPASE 9*  AMYLASE --   No results found for this basename: AMMONIA:5 in the last 168 hours CBC:  Lab 04/09/12 0503 04/08/12 0510 04/07/12 1640 04/07/12 1000  WBC 10.4 8.5 10.2 11.0*  NEUTROABS 6.9 -- -- 8.0*  HGB 8.8* 9.2* 9.2* 10.4*  HCT 27.1* 28.8* 28.5* 31.8*  MCV 91.2 92.3 90.5 90.9  PLT 277 246 255 PLATELET CLUMPS NOTED ON SMEAR, COUNT APPEARS ADEQUATE   Cardiac Enzymes:  Lab 04/08/12 1930 04/07/12 1300 04/07/12 1000  CKTOTAL 37 -- --  CKMB -- -- --  CKMBINDEX -- -- --  TROPONINI -- <0.30 <0.30   BNP (last 3 results) No  results found for this basename: PROBNP:3 in the last 8760 hours CBG:  Lab 04/11/12 1807 04/11/12 1153 04/11/12 0805 04/11/12 0408 04/11/12 0011  GLUCAP 114* 134* 106* 117* 131*    Recent Results (from the past 240 hour(s))  WOUND CULTURE     Status: Normal   Collection Time   04/07/12  2:16 PM      Component Value Range Status Comment   Specimen Description ABDOMEN   Final    Special Requests NONE   Final    Gram Stain     Final    Value: RARE WBC PRESENT,BOTH PMN AND MONONUCLEAR     NO SQUAMOUS EPITHELIAL CELLS SEEN     MODERATE GRAM POSITIVE RODS     RARE GRAM NEGATIVE RODS     RARE GRAM POSITIVE COCCI   Culture     Final    Value: FEW METHICILLIN RESISTANT STAPHYLOCOCCUS AUREUS     Note: RIFAMPIN AND  GENTAMICIN SHOULD NOT BE USED AS SINGLE DRUGS FOR TREATMENT OF STAPH INFECTIONS. CRITICAL RESULT CALLED TO, READ BACK BY AND VERIFIED WITH: Elsie Lincoln 04/11/12 1035 BY SMITHERSJ   Report Status 04/11/2012 FINAL   Final    Organism ID, Bacteria METHICILLIN RESISTANT STAPHYLOCOCCUS AUREUS   Final   WOUND CULTURE     Status: Normal   Collection Time   04/07/12  2:16 PM      Component Value Range Status Comment   Specimen Description ABDOMEN   Final    Special Requests NONE   Final    Gram Stain     Final    Value: ABUNDANT WBC PRESENT,BOTH PMN AND MONONUCLEAR     NO SQUAMOUS EPITHELIAL CELLS SEEN     MODERATE GRAM POSITIVE RODS   Culture     Final    Value: MULTIPLE ORGANISMS PRESENT, NONE PREDOMINANT     Note: NO STAPHYLOCOCCUS AUREUS ISOLATED NO GROUP A STREP (S.PYOGENES) ISOLATED   Report Status 04/10/2012 FINAL   Final   MRSA PCR SCREENING     Status: Normal   Collection Time   04/10/12  8:48 PM      Component Value Range Status Comment   MRSA by PCR NEGATIVE  NEGATIVE Final      Studies: Dg Chest 2 View  04/07/2012  *RADIOLOGY REPORT*  Clinical Data: Weakness.  CHEST - 2 VIEW  Comparison: None.  Findings: Lung volumes are low. The cardiopericardial silhouette is enlarged.  No focal airspace consolidation or airspace pulmonary edema. Imaged bony structures of the thorax are intact. Telemetry leads overlie the chest.  IMPRESSION: Low volume film with cardiomegaly.  No acute cardiopulmonary findings.   Original Report Authenticated By: ERIC A. MANSELL, M.D.    Ct Head Wo Contrast  04/07/2012  *RADIOLOGY REPORT*  Clinical Data:  Status post fall, right orbital/chin bruising/hematoma  CT HEAD WITHOUT CONTRAST CT MAXILLOFACIAL WITHOUT CONTRAST  Technique:  Multidetector CT imaging of the head and maxillofacial structures were performed using the standard protocol without intravenous contrast. Multiplanar CT image reconstructions of the maxillofacial structures were also generated.  Comparison:   None.  CT HEAD  Findings: No evidence of parenchymal hemorrhage or extra-axial fluid collection. No mass lesion, mass effect, or midline shift.  No CT evidence of acute infarction.  Mild global cortical atrophy.  No ventriculomegaly.  Calcifications in the right basal ganglia and cerebellum.  No evidence of calvarial fracture.  IMPRESSION: No evidence of acute intracranial abnormality.  CT MAXILLOFACIAL  Findings:   No evidence  of maxillofacial fracture.  The visualized paranasal sinuses are essentially clear. The mastoid air cells are unopacified.  The bilateral orbits, including the retroconal soft tissues, are within normal limits.  Mild soft tissue swelling/bruising overlying the right maxilla (series 4/image 28).  The mandible, including the mandibular condyles, are within normal limits.  Degenerative changes of the cervical spine to C4-5.  IMPRESSION: Mild soft tissue swelling/bruising overlying the right maxilla.  No evidence of maxillofacial fracture.   Original Report Authenticated By: Charline Bills, M.D.    Ct Abdomen Pelvis W Contrast  04/07/2012  *RADIOLOGY REPORT*  Clinical Data: Abdominal pain.  Status post recent hernia repair. Possible wound infection.  CT ABDOMEN AND PELVIS WITH CONTRAST  Technique:  Multidetector CT imaging of the abdomen and pelvis was performed following the standard protocol during bolus administration of intravenous contrast.  Contrast: OMNIPAQUE IOHEXOL 300 MG/ML  SOLN  Comparison: No priors.  Findings:  Lung Bases: Cardiomegaly.  Otherwise, unremarkable.  Abdomen/Pelvis:  The enhanced appearance of the liver, gallbladder, pancreas, spleen and bilateral adrenal glands is unremarkable. There is mild multifocal parenchymal thinning in the kidneys, most compatible with scarring.  Additionally, there are multiple tiny subcentimeter low attenuation lesions in the kidneys bilaterally which are too small to definitively characterize, but are statistically likely to  represent small cysts.  The largest of these measures 9 mm in the upper pole of the right kidney.  There are postoperative changes in the transverse colon, suggesting prior partial colectomy.  The patient is also status post hysterectomy and left salpingo-oophorectomy.  A 2.7 x 1.9 cm partially calcified soft tissue attenuation (48 HU) lesion in the right adnexa is likely ovarian in etiology.  No significant volume of ascites.  No pneumoperitoneum.  No pathologic distension of small bowel.  There are numerous small bowel loops which appear intimately associated with the anterior abdominal wall.  Musculoskeletal: In the subcutaneous fat of the anterior abdominal wall immediately above and around the umbilicus there is a large rim enhancing fluid collection that measures approximately 10.7 x 7.2 x 2.0 cm, concerning for a superficial abscess.  At the level of the umbilicus, there appears to be a frank defect in the subcutaneous soft tissues of the abdomen which extends to the underlying abdominal wall musculature, and in this region there is pooling of the oral contrast material.  This is indicative of an enterocutaneous fistula (despite the fact that no definite fistulous tract is clearly identified on this examination).  There are no aggressive appearing lytic or blastic lesions noted in the visualized portions of the skeleton.  IMPRESSION: 1.  Findings, as above, compatible with an enterocutaneous fistula, likely from the small bowel, which extends to the body surface at the level of the umbilicus. 2.  In addition, there is a large abscess in the anterior abdominal wall subcutaneous fat immediately above the umbilicus, as above. 3.  2.7 x 1.9 cm soft tissue attenuation lesion in the right adnexa is likely ovarian in etiology.  This could be better evaluated with a non emergent transvaginal ultrasound to exclude neoplasm. 4.  Status post partial colectomy in the mid transverse colon.  The patient is also status post  hysterectomy and left oophorectomy. 5.  Additional incidental findings, as above.   Original Report Authenticated By: Florencia Reasons, M.D.    Ct Maxillofacial Wo Cm  04/07/2012  *RADIOLOGY REPORT*  Clinical Data:  Status post fall, right orbital/chin bruising/hematoma  CT HEAD WITHOUT CONTRAST CT MAXILLOFACIAL WITHOUT CONTRAST  Technique:  Multidetector CT imaging of the head and maxillofacial structures were performed using the standard protocol without intravenous contrast. Multiplanar CT image reconstructions of the maxillofacial structures were also generated.  Comparison:  None.  CT HEAD  Findings: No evidence of parenchymal hemorrhage or extra-axial fluid collection. No mass lesion, mass effect, or midline shift.  No CT evidence of acute infarction.  Mild global cortical atrophy.  No ventriculomegaly.  Calcifications in the right basal ganglia and cerebellum.  No evidence of calvarial fracture.  IMPRESSION: No evidence of acute intracranial abnormality.  CT MAXILLOFACIAL  Findings:   No evidence of maxillofacial fracture.  The visualized paranasal sinuses are essentially clear. The mastoid air cells are unopacified.  The bilateral orbits, including the retroconal soft tissues, are within normal limits.  Mild soft tissue swelling/bruising overlying the right maxilla (series 4/image 28).  The mandible, including the mandibular condyles, are within normal limits.  Degenerative changes of the cervical spine to C4-5.  IMPRESSION: Mild soft tissue swelling/bruising overlying the right maxilla.  No evidence of maxillofacial fracture.   Original Report Authenticated By: Charline Bills, M.D.     Scheduled Meds:    . aztreonam  1 g Intravenous Q8H  . benztropine  1 mg Oral BID  . chlorproMAZINE  50 mg Oral BID  . DAPTOmycin (CUBICIN)  IV  6 mg/kg Intravenous Q24H  . divalproex  125 mg Oral BID  . divalproex  250 mg Oral BID  . FLUoxetine  40 mg Oral Daily  . heparin  5,000 Units Subcutaneous Q8H  .  insulin aspart  0-9 Units Subcutaneous Q6H  . metoprolol succinate  12.5 mg Oral Daily  . metronidazole  500 mg Intravenous Q8H  . multivitamin with minerals  1 tablet Oral Daily  . pantoprazole (PROTONIX) IV  40 mg Intravenous QHS  . DISCONTD: insulin aspart  0-9 Units Subcutaneous Q4H   Continuous Infusions:    . 0.9 % NaCl with KCl 20 mEq / L 60 mL/hr (04/11/12 0929)  . TPN (CLINIMIX) +/- additives 70 mL/hr at 04/10/12 1743  . TPN (CLINIMIX) +/- additives 70 mL/hr at 04/11/12 1746    Principal Problem:  *Enterocutaneous fistula Active Problems:  Colon cancer  Sleep apnea  HTN (hypertension), benign  Bipolar 1 disorder    Time spent:    Cordae Mccarey C  [facility] Pager [phone] If 8PM-8AM, please contact night-coverage at [email or web address], password [facility] 04/11/2012, 8:35 PM  LOS: 4 days

## 2012-04-11 NOTE — Progress Notes (Signed)
ANTIBIOTIC CONSULT NOTE - FOLLOW UP  Pharmacy Consult for Daptomycin Indication: hx VRE abdominal wound  Allergies  Allergen Reactions  . Penicillins   . Tetanus Toxoids    Patient Measurements: Height: 5\' 3"  (160 cm) Weight: 168 lb (76.204 kg) IBW/kg (Calculated) : 52.4   Vital Signs: Temp: 97.8 F (36.6 C) (09/08 0626) Temp src: Oral (09/08 0626) BP: 132/75 mmHg (09/08 0626) Pulse Rate: 80  (09/08 0626) Intake/Output from previous day: 09/07 0701 - 09/08 0700 In: 1422 [I.V.:1422] Out: 2370 [Urine:2350; Drains:20] Intake/Output from this shift:    Labs:  Basename 04/11/12 0500 04/10/12 0450 04/09/12 0503  WBC -- -- 10.4  HGB -- -- 8.8*  PLT -- -- 277  LABCREA -- -- --  CREATININE 0.95 0.97 1.11*   Estimated Creatinine Clearance: 60.8 ml/min (by C-G formula based on Cr of 0.95). No results found for this basename: VANCOTROUGH:2,VANCOPEAK:2,VANCORANDOM:2,GENTTROUGH:2,GENTPEAK:2,GENTRANDOM:2,TOBRATROUGH:2,TOBRAPEAK:2,TOBRARND:2,AMIKACINPEAK:2,AMIKACINTROU:2,AMIKACIN:2, in the last 72 hours   Microbiology: Recent Results (from the past 720 hour(s))  WOUND CULTURE     Status: Normal (Preliminary result)   Collection Time   04/07/12  2:16 PM      Component Value Range Status Comment   Specimen Description ABDOMEN   Final    Special Requests NONE   Final    Gram Stain     Final    Value: RARE WBC PRESENT,BOTH PMN AND MONONUCLEAR     NO SQUAMOUS EPITHELIAL CELLS SEEN     MODERATE GRAM POSITIVE RODS     RARE GRAM NEGATIVE RODS     RARE GRAM POSITIVE COCCI   Culture     Final    Value: FEW STAPHYLOCOCCUS AUREUS     Note: RIFAMPIN AND GENTAMICIN SHOULD NOT BE USED AS SINGLE DRUGS FOR TREATMENT OF STAPH INFECTIONS.   Report Status PENDING   Incomplete   WOUND CULTURE     Status: Normal   Collection Time   04/07/12  2:16 PM      Component Value Range Status Comment   Specimen Description ABDOMEN   Final    Special Requests NONE   Final    Gram Stain     Final    Value: ABUNDANT WBC PRESENT,BOTH PMN AND MONONUCLEAR     NO SQUAMOUS EPITHELIAL CELLS SEEN     MODERATE GRAM POSITIVE RODS   Culture     Final    Value: MULTIPLE ORGANISMS PRESENT, NONE PREDOMINANT     Note: NO STAPHYLOCOCCUS AUREUS ISOLATED NO GROUP A STREP (S.PYOGENES) ISOLATED   Report Status 04/10/2012 FINAL   Final   MRSA PCR SCREENING     Status: Normal   Collection Time   04/10/12  8:48 PM      Component Value Range Status Comment   MRSA by PCR NEGATIVE  NEGATIVE Final     Anti-infectives     Start     Dose/Rate Route Frequency Ordered Stop   04/08/12 2200   aztreonam (AZACTAM) 1 g in dextrose 5 % 50 mL IVPB        1 g 100 mL/hr over 30 Minutes Intravenous 3 times per day 04/08/12 1845     04/08/12 2200   DAPTOmycin (CUBICIN) 457 mg in sodium chloride 0.9 % IVPB        6 mg/kg  76.2 kg 218.3 mL/hr over 30 Minutes Intravenous Every 24 hours 04/08/12 1900     04/07/12 2000   clindamycin (CLEOCIN) IVPB 300 mg  Status:  Discontinued  300 mg 100 mL/hr over 30 Minutes Intravenous Every 8 hours 04/07/12 1628 04/08/12 1845   04/07/12 1800   vancomycin (VANCOCIN) 750 mg in sodium chloride 0.9 % 150 mL IVPB  Status:  Discontinued        750 mg 150 mL/hr over 60 Minutes Intravenous Every 12 hours 04/07/12 1710 04/08/12 1845   04/07/12 1700   metroNIDAZOLE (FLAGYL) IVPB 500 mg        500 mg 100 mL/hr over 60 Minutes Intravenous Every 8 hours 04/07/12 1628     04/07/12 1545   metroNIDAZOLE (FLAGYL) IVPB 500 mg  Status:  Discontinued        500 mg 100 mL/hr over 60 Minutes Intravenous  Once 04/07/12 1539 04/07/12 1541   04/07/12 1530   metroNIDAZOLE (FLAGYL) IVPB 500 mg        500 mg 100 mL/hr over 60 Minutes Intravenous  Once 04/07/12 1519 04/07/12 1647   04/07/12 1000   clindamycin (CLEOCIN) IVPB 600 mg        600 mg 100 mL/hr over 30 Minutes Intravenous  Once 04/07/12 0948 04/07/12 1245         Assessment:  61yo F with infected abdominal wound and  enterocutaneous fistula after ventral hernia repair and more remote partial colectomy for colon cancer.  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  On broad-spectrum abx: Day 3 Daptomycin, and Aztreonam. D4 Flagyl.  Afebrile, WBCs wnl, SCr wnl.  Baseline CK on 9/6 was wnl.  Wound cx growing multiple organism types. Staph aureus isolated and sensitivities are pending.  Goal of Therapy:  Eradication of infection Appropriate dosing  Plan:   Continue daptomycin at 6mg /kg IV q24h.  Agree with aztreonam 1g IV q8h and Flagyl 500mg  IV q8h, dosing per MD.  Follow up renal fxn and culture results.  Check CK weekly while on daptomycin.  Charolotte Eke, PharmD, pager [phone]. 04/11/2012,7:58 AM.

## 2012-04-11 NOTE — Progress Notes (Signed)
General Surgery Note  LOS: 4 days  Room - 3704  Assessment/Plan: 1.  Enterocutaneous fistula   On Flagyl.  On TPN.  Labs for tomorrow.  Diet - NPO and ice chips.  Has pouch on fistula.  No complaints - but needs to ambulate more.     2.  Colon cancer - resected 2011 (about), but we have no info about stage or treatment - the patient is not a good historian.  Status post ventral hernia repair with mesh, 02/04/12, Dr. Para March in [place], [place].  Status post multiple surgeries  (02/24/2012 and 7/252013) for removal of infected mesh, nonhealing abdominal wound, now with enterocutaneous fistula all done in [place].  Moved to [place] about 2 weeks ago to live with sister.  3.  Sleep apnea , on CPAP 4.  HTN (hypertension), benign   5.  Bipolar 1 disorder  "schizoaffective disorder" in old chart  6.  Has bruise around right eye and chin from fall prior to this hospitalization. 7.  Poor dentition. 8.  History of MRSA and VRE in [place] -  nasal swab - negative, and wound cultures show only Staph - does not need isolation. 9.  DVT - on SQ Heparin  Subjective:  Doing okay.  No complaints of leak.   Needs to ambulate more.  Objective:   Filed Vitals:   04/11/12 0626  BP: 132/75  Pulse: 80  Temp: 97.8 F (36.6 C)  Resp: 18     Intake/Output from previous day:  09/07 0701 - 09/08 0700 In: 8889 [I.V.:1422] Out: 2370 [Urine:2350; Drains:20]  Intake/Output this shift:      Physical Exam:   General: Obese older WF who is alert and oriented. Somewhat hard to understand.  Is poor historian.   HEENT: Normal. Pupils equal.  Bruise right eye and chin. .   Abdomen: Soft.  BS.   Wound: Has Eiken's pouch over fistula.  Has open wound above pouch.   Neurologic:  Grossly intact to motor and sensory function.  PT consulted.     Lab Results:     [facility] 04/09/12 0503  WBC 10.4  HGB 8.8*  HCT 27.1*  PLT 277    BMET    Basename 04/11/12 0500 04/10/12 0450  NA 137 137  K 4.9 4.9   CL 106 104  CO2 24 23  GLUCOSE 127* 118*  BUN 23 21  CREATININE 0.95 0.97  CALCIUM 8.6 8.5    PT/INR  No results found for this basename: LABPROT:2,INR:2 in the last 72 hours  ABG  No results found for this basename: PHART:2,PCO2:2,PO2:2,HCO3:2 in the last 72 hours   Studies/Results:  No results found.   Anti-infectives:   Anti-infectives     Start     Dose/Rate Route Frequency Ordered Stop   04/08/12 2200   aztreonam (AZACTAM) 1 g in dextrose 5 % 50 mL IVPB        1 g 100 mL/hr over 30 Minutes Intravenous 3 times per day 04/08/12 1845     04/08/12 2200   DAPTOmycin (CUBICIN) 457 mg in sodium chloride 0.9 % IVPB        6 mg/kg  76.2 kg 218.3 mL/hr over 30 Minutes Intravenous Every 24 hours 04/08/12 1900     04/07/12 2000   clindamycin (CLEOCIN) IVPB 300 mg  Status:  Discontinued        300 mg 100 mL/hr over 30 Minutes Intravenous Every 8 hours 04/07/12 1628 04/08/12 1845   04/07/12 1800  vancomycin (VANCOCIN) 750 mg in sodium chloride 0.9 % 150 mL IVPB  Status:  Discontinued        750 mg 150 mL/hr over 60 Minutes Intravenous Every 12 hours 04/07/12 1710 04/08/12 1845   04/07/12 1700   metroNIDAZOLE (FLAGYL) IVPB 500 mg        500 mg 100 mL/hr over 60 Minutes Intravenous Every 8 hours 04/07/12 1628     04/07/12 1545   metroNIDAZOLE (FLAGYL) IVPB 500 mg  Status:  Discontinued        500 mg 100 mL/hr over 60 Minutes Intravenous  Once 04/07/12 1539 04/07/12 1541   04/07/12 1530   metroNIDAZOLE (FLAGYL) IVPB 500 mg        500 mg 100 mL/hr over 60 Minutes Intravenous  Once 04/07/12 1519 04/07/12 1647   04/07/12 1000   clindamycin (CLEOCIN) IVPB 600 mg        600 mg 100 mL/hr over 30 Minutes Intravenous  Once 04/07/12 0948 04/07/12 1245          Alphonsa Overall, MD, [place] Pager: [phone],   [facility] Office: [phone] 04/11/2012

## 2012-04-11 NOTE — Progress Notes (Signed)
No bed offers have been made at this time.   Weekday CSW to f/u.  Leron Croak, LCSWA [employer] [phone]

## 2012-04-12 DIAGNOSIS — F319 Bipolar disorder, unspecified: Secondary | ICD-10-CM

## 2012-04-12 DIAGNOSIS — I10 Essential (primary) hypertension: Secondary | ICD-10-CM

## 2012-04-12 DIAGNOSIS — G4730 Sleep apnea, unspecified: Secondary | ICD-10-CM

## 2012-04-12 LAB — CBC
HCT: 28.9 % — ABNORMAL LOW (ref 36.0–46.0)
Hemoglobin: 9.2 g/dL — ABNORMAL LOW (ref 12.0–15.0)
MCH: 29.4 pg (ref 26.0–34.0)
MCV: 92.3 fL (ref 78.0–100.0)
Platelets: 287 10*3/uL (ref 150–400)
RBC: 3.13 MIL/uL — ABNORMAL LOW (ref 3.87–5.11)

## 2012-04-12 LAB — GLUCOSE, CAPILLARY
Glucose-Capillary: 127 mg/dL — ABNORMAL HIGH (ref 70–99)
Glucose-Capillary: 131 mg/dL — ABNORMAL HIGH (ref 70–99)
Glucose-Capillary: 133 mg/dL — ABNORMAL HIGH (ref 70–99)
Glucose-Capillary: 150 mg/dL — ABNORMAL HIGH (ref 70–99)

## 2012-04-12 LAB — DIFFERENTIAL
Eosinophils Absolute: 0.6 10*3/uL (ref 0.0–0.7)
Eosinophils Relative: 8 % — ABNORMAL HIGH (ref 0–5)
Lymphs Abs: 1.9 10*3/uL (ref 0.7–4.0)
Monocytes Absolute: 0.9 10*3/uL (ref 0.1–1.0)
Monocytes Relative: 11 % (ref 3–12)

## 2012-04-12 LAB — COMPREHENSIVE METABOLIC PANEL
ALT: 7 U/L (ref 0–35)
Alkaline Phosphatase: 63 U/L (ref 39–117)
GFR calc Af Amer: 78 mL/min — ABNORMAL LOW (ref >90)
Glucose, Bld: 135 mg/dL — ABNORMAL HIGH (ref 70–99)
Potassium: 4.7 mEq/L (ref 3.5–5.1)
Sodium: 139 mEq/L (ref 135–145)
Total Protein: 6.1 g/dL (ref 6.0–8.3)

## 2012-04-12 MED ORDER — FAT EMULSION 20 % IV EMUL
250.0000 mL | INTRAVENOUS | Status: DC
  Administered 2012-04-12: 250 mL via INTRAVENOUS
  Filled 2012-04-12: qty 250

## 2012-04-12 MED ORDER — CLINIMIX E/DEXTROSE (5/20) 5 % IV SOLN
INTRAVENOUS | Status: DC
  Administered 2012-04-12: 18:00:00 via INTRAVENOUS
  Filled 2012-04-12: qty 2000

## 2012-04-12 NOTE — Progress Notes (Signed)
CSW assisting with SNF placement. No bed offers received in [facility]. SNF search has been extended to surrounding counties. PASRR request submitted. Awaiting PASRR #. CSW will continue to follow to assist with d/c planning to SNF.  Cori Razor LCSW [phone]

## 2012-04-12 NOTE — Progress Notes (Signed)
Physical Therapy Treatment Patient Details Name: Gina [name] MRN: [id number] DOB: 05-May-1951 Today's Date: 04/12/2012 Time: 4098-1191 PT Time Calculation (min): 25 min  PT Assessment / Plan / Recommendation Comments on Treatment Session  Pt reports h/o falls due to "passing out" and not from balance or environment.  Pt able to tolerate increased ambulation distance today.  Pt toileted and brushed teeth with supervision.  If pt has supervision with mobility at home, feel pt could d/c home with HHPT, however if not ST-SNF.    Follow Up Recommendations  Home health PT;Supervision for mobility/OOB    Barriers to Discharge        Equipment Recommendations  None recommended by PT    Recommendations for Other Services    Frequency     Plan Discharge plan needs to be updated;Frequency remains appropriate    Precautions / Restrictions Precautions Precautions: Fall Restrictions Weight Bearing Restrictions: No   Pertinent Vitals/Pain No pain    Mobility  Bed Mobility Bed Mobility: Supine to Sit;Sit to Supine Supine to Sit: 6: Modified independent (Device/Increase time) Sit to Supine: 6: Modified independent (Device/Increase time) Transfers Transfers: Sit to Stand;Stand to Sit Sit to Stand: 4: Min guard;From bed;With upper extremity assist;From toilet Stand to Sit: 4: Min guard;To toilet;To bed Details for Transfer Assistance: min/guard for h/o falls Ambulation/Gait Ambulation/Gait Assistance: 4: Min guard Ambulation Distance (Feet): 300 Feet Assistive device: Other (Comment) (pushing IV pole) Ambulation/Gait Assistance Details: pt pushed IV pole, no LOB or unsteadiness observed, pt denies any SOB or pain Gait Pattern: Step-through pattern;Decreased stride length Gait velocity: very slow    Exercises     PT Diagnosis:    PT Problem List:   PT Treatment Interventions:     PT Goals Acute Rehab PT Goals PT Goal: Supine/Side to Sit - Progress: Met PT Goal: Sit to Stand  - Progress: Progressing toward goal PT Goal: Ambulate - Progress: Progressing toward goal  Visit Information  Last PT Received On: 04/12/12 Assistance Needed: +1    Subjective Data  Subjective: I just moved here to live with my sister.   Cognition  Overall Cognitive Status: Impaired Area of Impairment: Memory Arousal/Alertness: Awake/alert Orientation Level: Appears intact for tasks assessed Behavior During Session: [facility] for tasks performed Memory Deficits: pt reports poor memory    Balance  Dynamic Standing Balance Dynamic Standing - Balance Support: No upper extremity supported;During functional activity Dynamic Standing - Level of Assistance: 5: Stand by assistance Dynamic Standing - Balance Activities: Lateral lean/weight shifting;Forward lean/weight shifting Dynamic Standing - Comments: pt brushed teeth at sink  End of Session PT - End of Session Activity Tolerance: Patient tolerated treatment well Patient left: in bed;with call bell/phone within reach   GP     [facility] E 04/12/2012, 10:02 AM Pager: [phone]

## 2012-04-12 NOTE — Progress Notes (Signed)
Patient ID: Gina [name], female   DOB: [birth year], 61 y.o.   MRN: [id number]    Subjective: Pt feels ok.  No c/o except difficulty controlling urine with lasix.  Objective: Vital signs in last 24 hours: Temp:  [97.7 F (36.5 C)-97.9 F (36.6 C)] 97.9 F (36.6 C) (09/09 0636) Pulse Rate:  [51-66] 66  (09/09 0636) Resp:  [16-18] 18  (09/09 0636) BP: (104-153)/(62-74) 104/62 mmHg (09/09 0636) SpO2:  [95 %-98 %] 95 % (09/09 0636) Last BM Date: 04/11/12  Intake/Output from previous day: 09/08 0701 - 09/09 0700 In: 2280 [I.V.:1440; TPN:840] Out: 3117 [Urine:3075; Drains:40; Stool:2] Intake/Output this shift: Total I/O In: -  Out: 800 [Urine:800]  PE: Abd: soft, bilious drainage from fistula.  Wound is packed and clean.  Otherwise NT, ND  Lab Results:   [facility] 04/12/12 0410  WBC 7.6  HGB 9.2*  HCT 28.9*  PLT 287   BMET  Basename 04/12/12 0410 04/11/12 0500  NA 139 137  K 4.7 4.9  CL 107 106  CO2 23 24  GLUCOSE 135* 127*  BUN 24* 23  CREATININE 0.90 0.95  CALCIUM 8.9 8.6   PT/INR No results found for this basename: LABPROT:2,INR:2 in the last 72 hours CMP     Component Value Date/Time   NA 139 04/12/2012 0410   K 4.7 04/12/2012 0410   CL 107 04/12/2012 0410   CO2 23 04/12/2012 0410   GLUCOSE 135* 04/12/2012 0410   BUN 24* 04/12/2012 0410   CREATININE 0.90 04/12/2012 0410   CALCIUM 8.9 04/12/2012 0410   PROT 6.1 04/12/2012 0410   ALBUMIN 2.2* 04/12/2012 0410   AST 12 04/12/2012 0410   ALT 7 04/12/2012 0410   ALKPHOS 63 04/12/2012 0410   BILITOT 0.1* 04/12/2012 0410   GFRNONAA 68* 04/12/2012 0410   GFRAA 78* 04/12/2012 0410   Lipase     Component Value Date/Time   LIPASE 9* 04/07/2012 1000       Studies/Results: No results found.  Anti-infectives: Anti-infectives     Start     Dose/Rate Route Frequency Ordered Stop   04/08/12 2200   aztreonam (AZACTAM) 1 g in dextrose 5 % 50 mL IVPB        1 g 100 mL/hr over 30 Minutes Intravenous 3 times per day 04/08/12 1845       04/08/12 2200   DAPTOmycin (CUBICIN) 457 mg in sodium chloride 0.9 % IVPB        6 mg/kg  76.2 kg 218.3 mL/hr over 30 Minutes Intravenous Every 24 hours 04/08/12 1900     04/07/12 2000   clindamycin (CLEOCIN) IVPB 300 mg  Status:  Discontinued        300 mg 100 mL/hr over 30 Minutes Intravenous Every 8 hours 04/07/12 1628 04/08/12 1845   04/07/12 1800   vancomycin (VANCOCIN) 750 mg in sodium chloride 0.9 % 150 mL IVPB  Status:  Discontinued        750 mg 150 mL/hr over 60 Minutes Intravenous Every 12 hours 04/07/12 1710 04/08/12 1845   04/07/12 1700   metroNIDAZOLE (FLAGYL) IVPB 500 mg        500 mg 100 mL/hr over 60 Minutes Intravenous Every 8 hours 04/07/12 1628     04/07/12 1545   metroNIDAZOLE (FLAGYL) IVPB 500 mg  Status:  Discontinued        500 mg 100 mL/hr over 60 Minutes Intravenous  Once 04/07/12 1539 04/07/12 1541   04/07/12 1530   metroNIDAZOLE (FLAGYL)  IVPB 500 mg        500 mg 100 mL/hr over 60 Minutes Intravenous  Once 04/07/12 1519 04/07/12 1647   04/07/12 1000   clindamycin (CLEOCIN) IVPB 600 mg        600 mg 100 mL/hr over 30 Minutes Intravenous  Once 04/07/12 0948 04/07/12 1245           Assessment/Plan  1. EC fistula s/p multiple abdominal surgeries in [place], [place]. 2. PCM/TNA  Plan: 1. SW is working on placement into either SNF or LTAC for further care with PICC/TNA for fistula 2. Cont current care for now.   LOS: 5 days    Diania Co E 04/12/2012

## 2012-04-12 NOTE — Progress Notes (Signed)
PARENTERAL NUTRITION CONSULT NOTE   Pharmacy Consult for TNA Indication: Enterocutaneous fistula   Allergies  Allergen Reactions  . Penicillins   . Tetanus Toxoids     Patient Measurements: Height: 5\' 3"  (160 cm) Weight: 168 lb (76.204 kg) IBW/kg (Calculated) : 52.4  Adjusted Body Weight: 61.9   Vital Signs: Temp: 97.9 F (36.6 C) (09/08 2200) Temp src: Oral (09/08 2200) BP: 144/74 mmHg (09/08 2200) Pulse Rate: 51  (09/08 2200) Intake/Output from previous day: 09/08 0701 - 09/09 0700 In: 1440 [I.V.:1440] Out: 2217 [Urine:2175; Drains:40; Stool:2] Intake/Output from this shift: Total I/O In: 712 [I.V.:712] Out: 1040 [Urine:1000; Drains:40]  Labs:  [facility] 04/12/12 0410  WBC 7.6  HGB 9.2*  HCT 28.9*  PLT 287  APTT --  INR --     Basename 04/12/12 0410 04/11/12 0500 04/10/12 0450  NA 139 137 137  K 4.7 4.9 4.9  CL 107 106 104  CO2 23 24 23   GLUCOSE 135* 127* 118*  BUN 24* 23 21  CREATININE 0.90 0.95 0.97  LABCREA -- -- --  CREAT24HRUR -- -- --  CALCIUM 8.9 8.6 8.5  MG 2.0 -- --  PHOS 3.9 -- --  PROT 6.1 -- --  ALBUMIN 2.2* -- --  AST 12 -- --  ALT 7 -- --  ALKPHOS 63 -- --  BILITOT 0.1* -- --  BILIDIR -- -- --  IBILI -- -- --  PREALBUMIN -- -- --  TRIG -- -- --  CHOLHDL -- -- --  CHOL -- -- --   Estimated Creatinine Clearance: 64.1 ml/min (by C-G formula based on Cr of 0.9).    Basename 04/12/12 0610 04/12/12 0020 04/11/12 1807  GLUCAP 127* 131* 114*   Medications:  Scheduled:     . aztreonam  1 g Intravenous Q8H  . benztropine  1 mg Oral BID  . chlorproMAZINE  50 mg Oral BID  . DAPTOmycin (CUBICIN)  IV  6 mg/kg Intravenous Q24H  . divalproex  125 mg Oral BID  . divalproex  250 mg Oral BID  . FLUoxetine  40 mg Oral Daily  . heparin  5,000 Units Subcutaneous Q8H  . insulin aspart  0-9 Units Subcutaneous Q6H  . metoprolol succinate  12.5 mg Oral Daily  . metronidazole  500 mg Intravenous Q8H  . multivitamin with minerals  1  tablet Oral Daily  . pantoprazole (PROTONIX) IV  40 mg Intravenous QHS  . DISCONTD: insulin aspart  0-9 Units Subcutaneous Q4H   Infusions:     . 0.9 % NaCl with KCl 20 mEq / L 60 mL/hr at 04/12/12 0421  . TPN (CLINIMIX) +/- additives 70 mL/hr at 04/10/12 1743  . TPN (CLINIMIX) +/- additives 70 mL/hr at 04/11/12 1746   PRN: acetaminophen, acetaminophen, diphenhydrAMINE, diphenhydrAMINE, HYDROcodone-acetaminophen, morphine injection, ondansetron, sodium chloride  Insulin Requirements in the past 24 hours:  3 units Novolog (sensitive SSI q6h).  Nutritional Goals:  Estimated nutritional Needs per RD note 9/5 Kcal: 1550-1800 Protein: 80-105 Fluid: 1.5-1.8L  Clinimix E 5/20 at Goal 70 ml/hr will provide 84 grams protein, 1478 KCals on nonlipid days and 1958 on lipid days providing an average of 1684 KCals/day/week.   Current Nutrition:  Clinimix E 5/20 at 70 ml/hr. Diet: NPO, sips with meds, Ice Chips MIVF: NS with 20 mEq KCL/L @ 60 ml/hr  Labs: Electrolytes: wnl, stable. Renal: Scr improved, wnl Liver: LFTs wnl except Bili low CBGs: controlled TGs: wnl (9/6), today's value pending Prealbumin: 9.8 (9/6), today's value pending  Assessment:  61 y/o F had elective ventral incisional hernia repair with mesh on 02/04/12.  Since hernia repair has had multiple surgical interventions for VRE/MRSA mesh infection (See surgeon note for details).  Now with enterocutaneous fistula found on admission, made NPO and TNA started 9/5.  Tolerating TNA at goal formula and rate.  Today's prealbumin and TG pending.   Plan:  1) Continue Clinimix E 5/20 at goal rate of 70 ml/hr. 2) Continue Fat emulsion 20% at 10 ml/hr on MWF only (national backorder) 3) Since tolerating PO meds, we will provide MVI and trace elements via the enteral route d/t ongoing shortages of the injectable dosage forms. 4) Follow-up on prealbumin and TG when results available 5) Routine TNA labs on Mondays and  Thursdays   Hope Budds, PharmD, BCPS Pager [phone] 04/12/2012 6:43 AM

## 2012-04-12 NOTE — Progress Notes (Signed)
[facility] PROGRESS NOTE  Gina [name] YQM:[id number] DOB: 01/26/1951 DOA: 04/07/2012 PCP: Eustaquio Boyden, MD  Assessment/Plan: Principal Problem:  *Enterocutaneous fistula Active Problems:  Colon cancer  Sleep apnea  HTN (hypertension), benign  Bipolar 1 disorder  1. Hypertension- BP stable today follow and increase back home meds as appropriate for now Pt remains off lasix due prior episodes of low BP -admit EKG reviewed no acute ischemic changes.  2. Bipolar disorder-stable, continue outpatient medications.  3. enterocutaneous fistula/abscess-per surgery/primary team  4. status post fall, likely mechanical- no reports of syncope.  5. anemia, normocytic-likely chronic disease- no significant change, follow. 6.OSA- continue CPAP  Brief narrative: Pt is a 5  with history of hypertension, bipolar disorder, depression, sleep apnea, renal insufficiency , history of colon cancer-status post partial colectomy in the past, and status post ventral hernia repair complicated by infection and subsequent multiple surgeries [facility] and with drainage from abdominal wound. Medicine consulted for medical management.    HPI/Subjective: She denies dizziness, no CP Objective: Filed Vitals:   04/11/12 2200 04/12/12 0636 04/12/12 1400 04/12/12 1445  BP: 144/74 104/62 92/39 118/52  Pulse: 51 66 63   Temp: 97.9 F (36.6 C) 97.9 F (36.6 C) 97.9 F (36.6 C)   TempSrc: Oral Oral Oral   Resp: 18 18 18    Height:      Weight:      SpO2: 98% 95% 97%     Intake/Output Summary (Last 24 hours) at 04/12/12 1928 Last data filed at 04/12/12 1855  Gross per 24 hour  Intake   2272 ml  Output   4040 ml  Net  -1768 ml   Filed Weights   04/07/12 1656  Weight: 76.204 kg (168 lb)    Exam:   General:  Obese elderly female in NAD, CPAP on  Cardiovascular: RRR, nl s1s2  Respiratory: clear, no crackles, no wheezes  Abdomen:soft+BS drainage bag in place with small  brownish dressing   Data Reviewed: Basic Metabolic Panel:  Lab 04/12/12 5284 04/11/12 0500 04/10/12 0450 04/09/12 0503 04/08/12 0513 04/08/12 0510  NA 139 137 137 135 -- 136  K 4.7 4.9 4.9 4.4 -- 4.4  CL 107 106 104 101 -- 101  CO2 23 24 23 25  -- 25  GLUCOSE 135* 127* 118* 117* -- 89  BUN 24* 23 21 22  -- 23  CREATININE 0.90 0.95 0.97 1.11* -- 1.28*  CALCIUM 8.9 8.6 8.5 8.4 -- 8.4  MG 2.0 -- -- 2.1 2.1 --  PHOS 3.9 -- -- 3.6 4.5 --   Liver Function Tests:  Lab 04/12/12 0410 04/09/12 0503 04/07/12 1000  AST 12 6 13   ALT 7 <5 7  ALKPHOS 63 60 67  BILITOT 0.1* 0.2* 0.2*  PROT 6.1 5.9* 7.4  ALBUMIN 2.2* 2.1* 2.7*    Lab 04/07/12 1000  LIPASE 9*  AMYLASE --   No results found for this basename: AMMONIA:5 in the last 168 hours CBC:  Lab 04/12/12 0410 04/09/12 0503 04/08/12 0510 04/07/12 1640 04/07/12 1000  WBC 7.6 10.4 8.5 10.2 11.0*  NEUTROABS 4.2 6.9 -- -- 8.0*  HGB 9.2* 8.8* 9.2* 9.2* 10.4*  HCT 28.9* 27.1* 28.8* 28.5* 31.8*  MCV 92.3 91.2 92.3 90.5 90.9  PLT 287 277 246 255 PLATELET CLUMPS NOTED ON SMEAR, COUNT APPEARS ADEQUATE   Cardiac Enzymes:  Lab 04/08/12 1930 04/07/12 1300 04/07/12 1000  CKTOTAL 37 -- --  CKMB -- -- --  CKMBINDEX -- -- --  TROPONINI -- <  0.30 <0.30   BNP (last 3 results) No results found for this basename: PROBNP:3 in the last 8760 hours CBG:  Lab 04/12/12 1757 04/12/12 1152 04/12/12 0610 04/12/12 0020 04/11/12 1807  GLUCAP 119* 150* 127* 131* 114*    Recent Results (from the past 240 hour(s))  WOUND CULTURE     Status: Normal   Collection Time   04/07/12  2:16 PM      Component Value Range Status Comment   Specimen Description ABDOMEN   Final    Special Requests NONE   Final    Gram Stain     Final    Value: RARE WBC PRESENT,BOTH PMN AND MONONUCLEAR     NO SQUAMOUS EPITHELIAL CELLS SEEN     MODERATE GRAM POSITIVE RODS     RARE GRAM NEGATIVE RODS     RARE GRAM POSITIVE COCCI   Culture     Final    Value: FEW METHICILLIN  RESISTANT STAPHYLOCOCCUS AUREUS     Note: RIFAMPIN AND GENTAMICIN SHOULD NOT BE USED AS SINGLE DRUGS FOR TREATMENT OF STAPH INFECTIONS. CRITICAL RESULT CALLED TO, READ BACK BY AND VERIFIED WITH: Elsie Lincoln 04/11/12 1035 BY SMITHERSJ   Report Status 04/11/2012 FINAL   Final    Organism ID, Bacteria METHICILLIN RESISTANT STAPHYLOCOCCUS AUREUS   Final   WOUND CULTURE     Status: Normal   Collection Time   04/07/12  2:16 PM      Component Value Range Status Comment   Specimen Description ABDOMEN   Final    Special Requests NONE   Final    Gram Stain     Final    Value: ABUNDANT WBC PRESENT,BOTH PMN AND MONONUCLEAR     NO SQUAMOUS EPITHELIAL CELLS SEEN     MODERATE GRAM POSITIVE RODS   Culture     Final    Value: MULTIPLE ORGANISMS PRESENT, NONE PREDOMINANT     Note: NO STAPHYLOCOCCUS AUREUS ISOLATED NO GROUP A STREP (S.PYOGENES) ISOLATED   Report Status 04/10/2012 FINAL   Final   MRSA PCR SCREENING     Status: Normal   Collection Time   04/10/12  8:48 PM      Component Value Range Status Comment   MRSA by PCR NEGATIVE  NEGATIVE Final      Studies: Dg Chest 2 View  04/07/2012  *RADIOLOGY REPORT*  Clinical Data: Weakness.  CHEST - 2 VIEW  Comparison: None.  Findings: Lung volumes are low. The cardiopericardial silhouette is enlarged.  No focal airspace consolidation or airspace pulmonary edema. Imaged bony structures of the thorax are intact. Telemetry leads overlie the chest.  IMPRESSION: Low volume film with cardiomegaly.  No acute cardiopulmonary findings.   Original Report Authenticated By: ERIC A. MANSELL, M.D.    Ct Head Wo Contrast  04/07/2012  *RADIOLOGY REPORT*  Clinical Data:  Status post fall, right orbital/chin bruising/hematoma  CT HEAD WITHOUT CONTRAST CT MAXILLOFACIAL WITHOUT CONTRAST  Technique:  Multidetector CT imaging of the head and maxillofacial structures were performed using the standard protocol without intravenous contrast. Multiplanar CT image reconstructions of the  maxillofacial structures were also generated.  Comparison:  None.  CT HEAD  Findings: No evidence of parenchymal hemorrhage or extra-axial fluid collection. No mass lesion, mass effect, or midline shift.  No CT evidence of acute infarction.  Mild global cortical atrophy.  No ventriculomegaly.  Calcifications in the right basal ganglia and cerebellum.  No evidence of calvarial fracture.  IMPRESSION: No evidence of acute intracranial abnormality.  CT MAXILLOFACIAL  Findings:   No evidence of maxillofacial fracture.  The visualized paranasal sinuses are essentially clear. The mastoid air cells are unopacified.  The bilateral orbits, including the retroconal soft tissues, are within normal limits.  Mild soft tissue swelling/bruising overlying the right maxilla (series 4/image 28).  The mandible, including the mandibular condyles, are within normal limits.  Degenerative changes of the cervical spine to C4-5.  IMPRESSION: Mild soft tissue swelling/bruising overlying the right maxilla.  No evidence of maxillofacial fracture.   Original Report Authenticated By: Charline Bills, M.D.    Ct Abdomen Pelvis W Contrast  04/07/2012  *RADIOLOGY REPORT*  Clinical Data: Abdominal pain.  Status post recent hernia repair. Possible wound infection.  CT ABDOMEN AND PELVIS WITH CONTRAST  Technique:  Multidetector CT imaging of the abdomen and pelvis was performed following the standard protocol during bolus administration of intravenous contrast.  Contrast: OMNIPAQUE IOHEXOL 300 MG/ML  SOLN  Comparison: No priors.  Findings:  Lung Bases: Cardiomegaly.  Otherwise, unremarkable.  Abdomen/Pelvis:  The enhanced appearance of the liver, gallbladder, pancreas, spleen and bilateral adrenal glands is unremarkable. There is mild multifocal parenchymal thinning in the kidneys, most compatible with scarring.  Additionally, there are multiple tiny subcentimeter low attenuation lesions in the kidneys bilaterally which are too small to  definitively characterize, but are statistically likely to represent small cysts.  The largest of these measures 9 mm in the upper pole of the right kidney.  There are postoperative changes in the transverse colon, suggesting prior partial colectomy.  The patient is also status post hysterectomy and left salpingo-oophorectomy.  A 2.7 x 1.9 cm partially calcified soft tissue attenuation (48 HU) lesion in the right adnexa is likely ovarian in etiology.  No significant volume of ascites.  No pneumoperitoneum.  No pathologic distension of small bowel.  There are numerous small bowel loops which appear intimately associated with the anterior abdominal wall.  Musculoskeletal: In the subcutaneous fat of the anterior abdominal wall immediately above and around the umbilicus there is a large rim enhancing fluid collection that measures approximately 10.7 x 7.2 x 2.0 cm, concerning for a superficial abscess.  At the level of the umbilicus, there appears to be a frank defect in the subcutaneous soft tissues of the abdomen which extends to the underlying abdominal wall musculature, and in this region there is pooling of the oral contrast material.  This is indicative of an enterocutaneous fistula (despite the fact that no definite fistulous tract is clearly identified on this examination).  There are no aggressive appearing lytic or blastic lesions noted in the visualized portions of the skeleton.  IMPRESSION: 1.  Findings, as above, compatible with an enterocutaneous fistula, likely from the small bowel, which extends to the body surface at the level of the umbilicus. 2.  In addition, there is a large abscess in the anterior abdominal wall subcutaneous fat immediately above the umbilicus, as above. 3.  2.7 x 1.9 cm soft tissue attenuation lesion in the right adnexa is likely ovarian in etiology.  This could be better evaluated with a non emergent transvaginal ultrasound to exclude neoplasm. 4.  Status post partial colectomy in  the mid transverse colon.  The patient is also status post hysterectomy and left oophorectomy. 5.  Additional incidental findings, as above.   Original Report Authenticated By: Florencia Reasons, M.D.    Ct Maxillofacial Wo Cm  04/07/2012  *RADIOLOGY REPORT*  Clinical Data:  Status post fall, right orbital/chin bruising/hematoma  CT  HEAD WITHOUT CONTRAST CT MAXILLOFACIAL WITHOUT CONTRAST  Technique:  Multidetector CT imaging of the head and maxillofacial structures were performed using the standard protocol without intravenous contrast. Multiplanar CT image reconstructions of the maxillofacial structures were also generated.  Comparison:  None.  CT HEAD  Findings: No evidence of parenchymal hemorrhage or extra-axial fluid collection. No mass lesion, mass effect, or midline shift.  No CT evidence of acute infarction.  Mild global cortical atrophy.  No ventriculomegaly.  Calcifications in the right basal ganglia and cerebellum.  No evidence of calvarial fracture.  IMPRESSION: No evidence of acute intracranial abnormality.  CT MAXILLOFACIAL  Findings:   No evidence of maxillofacial fracture.  The visualized paranasal sinuses are essentially clear. The mastoid air cells are unopacified.  The bilateral orbits, including the retroconal soft tissues, are within normal limits.  Mild soft tissue swelling/bruising overlying the right maxilla (series 4/image 28).  The mandible, including the mandibular condyles, are within normal limits.  Degenerative changes of the cervical spine to C4-5.  IMPRESSION: Mild soft tissue swelling/bruising overlying the right maxilla.  No evidence of maxillofacial fracture.   Original Report Authenticated By: Charline Bills, M.D.     Scheduled Meds:    . aztreonam  1 g Intravenous Q8H  . benztropine  1 mg Oral BID  . chlorproMAZINE  50 mg Oral BID  . DAPTOmycin (CUBICIN)  IV  6 mg/kg Intravenous Q24H  . divalproex  125 mg Oral BID  . divalproex  250 mg Oral BID  . FLUoxetine  40  mg Oral Daily  . heparin  5,000 Units Subcutaneous Q8H  . insulin aspart  0-9 Units Subcutaneous Q6H  . metoprolol succinate  12.5 mg Oral Daily  . metronidazole  500 mg Intravenous Q8H  . multivitamin with minerals  1 tablet Oral Daily  . pantoprazole (PROTONIX) IV  40 mg Intravenous QHS   Continuous Infusions:    . 0.9 % NaCl with KCl 20 mEq / L 60 mL/hr at 04/12/12 0421  . TPN (CLINIMIX) +/- additives 70 mL/hr at 04/12/12 1809   And  . fat emulsion 250 mL (04/12/12 1810)  . TPN (CLINIMIX) +/- additives 70 mL/hr at 04/11/12 1746    Principal Problem:  *Enterocutaneous fistula Active Problems:  Colon cancer  Sleep apnea  HTN (hypertension), benign  Bipolar 1 disorder    Time spent:    Margaruite Top C  [facility] Pager [phone] If 8PM-8AM, please contact night-coverage at [email or web address], password [facility] 04/12/2012, 7:28 PM  LOS: 5 days

## 2012-04-13 ENCOUNTER — Other Ambulatory Visit (HOSPITAL_COMMUNITY): Payer: Self-pay

## 2012-04-13 ENCOUNTER — Institutional Professional Consult (permissible substitution)
Admission: AD | Admit: 2012-04-13 | Discharge: 2012-05-15 | Disposition: A | Discharge status: Skilled Nursing Facility | Payer: Medicare Other | Source: Ambulatory Visit | Attending: Internal Medicine | Admitting: Internal Medicine

## 2012-04-13 ENCOUNTER — Ambulatory Visit: Payer: Self-pay | Admitting: Family Medicine

## 2012-04-13 DIAGNOSIS — E46 Unspecified protein-calorie malnutrition: Secondary | ICD-10-CM | POA: Diagnosis not present

## 2012-04-13 DIAGNOSIS — F3130 Bipolar disorder, current episode depressed, mild or moderate severity, unspecified: Secondary | ICD-10-CM | POA: Diagnosis not present

## 2012-04-13 DIAGNOSIS — N189 Chronic kidney disease, unspecified: Secondary | ICD-10-CM | POA: Diagnosis present

## 2012-04-13 DIAGNOSIS — J9819 Other pulmonary collapse: Secondary | ICD-10-CM | POA: Diagnosis not present

## 2012-04-13 DIAGNOSIS — Z9049 Acquired absence of other specified parts of digestive tract: Secondary | ICD-10-CM | POA: Diagnosis not present

## 2012-04-13 DIAGNOSIS — I10 Essential (primary) hypertension: Secondary | ICD-10-CM | POA: Diagnosis not present

## 2012-04-13 DIAGNOSIS — R109 Unspecified abdominal pain: Secondary | ICD-10-CM | POA: Diagnosis not present

## 2012-04-13 DIAGNOSIS — A4902 Methicillin resistant Staphylococcus aureus infection, unspecified site: Secondary | ICD-10-CM | POA: Diagnosis present

## 2012-04-13 DIAGNOSIS — Z85038 Personal history of other malignant neoplasm of large intestine: Secondary | ICD-10-CM | POA: Diagnosis not present

## 2012-04-13 DIAGNOSIS — I129 Hypertensive chronic kidney disease with stage 1 through stage 4 chronic kidney disease, or unspecified chronic kidney disease: Secondary | ICD-10-CM | POA: Diagnosis not present

## 2012-04-13 DIAGNOSIS — I82409 Acute embolism and thrombosis of unspecified deep veins of unspecified lower extremity: Secondary | ICD-10-CM | POA: Diagnosis not present

## 2012-04-13 DIAGNOSIS — Z9079 Acquired absence of other genital organ(s): Secondary | ICD-10-CM | POA: Diagnosis not present

## 2012-04-13 DIAGNOSIS — K5900 Constipation, unspecified: Secondary | ICD-10-CM | POA: Diagnosis not present

## 2012-04-13 DIAGNOSIS — E669 Obesity, unspecified: Secondary | ICD-10-CM | POA: Diagnosis not present

## 2012-04-13 DIAGNOSIS — T8189XA Other complications of procedures, not elsewhere classified, initial encounter: Secondary | ICD-10-CM | POA: Diagnosis not present

## 2012-04-13 DIAGNOSIS — C569 Malignant neoplasm of unspecified ovary: Secondary | ICD-10-CM | POA: Diagnosis not present

## 2012-04-13 DIAGNOSIS — R5381 Other malaise: Secondary | ICD-10-CM | POA: Diagnosis present

## 2012-04-13 DIAGNOSIS — Z8614 Personal history of Methicillin resistant Staphylococcus aureus infection: Secondary | ICD-10-CM | POA: Diagnosis not present

## 2012-04-13 DIAGNOSIS — G4730 Sleep apnea, unspecified: Secondary | ICD-10-CM

## 2012-04-13 DIAGNOSIS — Z90710 Acquired absence of both cervix and uterus: Secondary | ICD-10-CM | POA: Diagnosis not present

## 2012-04-13 DIAGNOSIS — F3162 Bipolar disorder, current episode mixed, moderate: Secondary | ICD-10-CM | POA: Diagnosis not present

## 2012-04-13 DIAGNOSIS — K632 Fistula of intestine: Secondary | ICD-10-CM | POA: Diagnosis not present

## 2012-04-13 DIAGNOSIS — R197 Diarrhea, unspecified: Secondary | ICD-10-CM | POA: Diagnosis not present

## 2012-04-13 DIAGNOSIS — S0003XA Contusion of scalp, initial encounter: Secondary | ICD-10-CM | POA: Diagnosis not present

## 2012-04-13 DIAGNOSIS — S0510XA Contusion of eyeball and orbital tissues, unspecified eye, initial encounter: Secondary | ICD-10-CM | POA: Diagnosis not present

## 2012-04-13 DIAGNOSIS — T8183XA Persistent postprocedural fistula, initial encounter: Secondary | ICD-10-CM | POA: Diagnosis present

## 2012-04-13 DIAGNOSIS — Z1389 Encounter for screening for other disorder: Secondary | ICD-10-CM | POA: Diagnosis not present

## 2012-04-13 DIAGNOSIS — T8579XA Infection and inflammatory reaction due to other internal prosthetic devices, implants and grafts, initial encounter: Secondary | ICD-10-CM | POA: Diagnosis present

## 2012-04-13 DIAGNOSIS — Z5189 Encounter for other specified aftercare: Secondary | ICD-10-CM | POA: Diagnosis not present

## 2012-04-13 DIAGNOSIS — K4190 Unilateral femoral hernia, without obstruction or gangrene, not specified as recurrent: Secondary | ICD-10-CM | POA: Diagnosis not present

## 2012-04-13 DIAGNOSIS — R12 Heartburn: Secondary | ICD-10-CM | POA: Diagnosis not present

## 2012-04-13 DIAGNOSIS — T8140XA Infection following a procedure, unspecified, initial encounter: Secondary | ICD-10-CM | POA: Diagnosis not present

## 2012-04-13 DIAGNOSIS — I498 Other specified cardiac arrhythmias: Secondary | ICD-10-CM | POA: Diagnosis present

## 2012-04-13 DIAGNOSIS — R1310 Dysphagia, unspecified: Secondary | ICD-10-CM | POA: Diagnosis present

## 2012-04-13 DIAGNOSIS — L293 Anogenital pruritus, unspecified: Secondary | ICD-10-CM | POA: Diagnosis not present

## 2012-04-13 DIAGNOSIS — K651 Peritoneal abscess: Secondary | ICD-10-CM | POA: Diagnosis present

## 2012-04-13 DIAGNOSIS — S31109A Unspecified open wound of abdominal wall, unspecified quadrant without penetration into peritoneal cavity, initial encounter: Secondary | ICD-10-CM | POA: Diagnosis not present

## 2012-04-13 DIAGNOSIS — B958 Unspecified staphylococcus as the cause of diseases classified elsewhere: Secondary | ICD-10-CM | POA: Diagnosis not present

## 2012-04-13 DIAGNOSIS — F411 Generalized anxiety disorder: Secondary | ICD-10-CM | POA: Diagnosis not present

## 2012-04-13 DIAGNOSIS — F319 Bipolar disorder, unspecified: Secondary | ICD-10-CM | POA: Diagnosis present

## 2012-04-13 DIAGNOSIS — C189 Malignant neoplasm of colon, unspecified: Secondary | ICD-10-CM | POA: Diagnosis not present

## 2012-04-13 DIAGNOSIS — Z6829 Body mass index (BMI) 29.0-29.9, adult: Secondary | ICD-10-CM | POA: Diagnosis not present

## 2012-04-13 DIAGNOSIS — E1169 Type 2 diabetes mellitus with other specified complication: Secondary | ICD-10-CM | POA: Diagnosis not present

## 2012-04-13 DIAGNOSIS — M6281 Muscle weakness (generalized): Secondary | ICD-10-CM | POA: Diagnosis not present

## 2012-04-13 DIAGNOSIS — D638 Anemia in other chronic diseases classified elsewhere: Secondary | ICD-10-CM | POA: Diagnosis present

## 2012-04-13 DIAGNOSIS — Z880 Allergy status to penicillin: Secondary | ICD-10-CM | POA: Diagnosis not present

## 2012-04-13 DIAGNOSIS — Z79899 Other long term (current) drug therapy: Secondary | ICD-10-CM | POA: Diagnosis not present

## 2012-04-13 DIAGNOSIS — G4733 Obstructive sleep apnea (adult) (pediatric): Secondary | ICD-10-CM | POA: Diagnosis not present

## 2012-04-13 LAB — GLUCOSE, CAPILLARY
Glucose-Capillary: 136 mg/dL — ABNORMAL HIGH (ref 70–99)
Glucose-Capillary: 162 mg/dL — ABNORMAL HIGH (ref 70–99)

## 2012-04-13 MED ORDER — CLINIMIX E/DEXTROSE (5/20) 5 % IV SOLN
INTRAVENOUS | Status: DC
  Filled 2012-04-13: qty 2000

## 2012-04-13 NOTE — Progress Notes (Signed)
Report called in to [facility] for pt. Transfer.

## 2012-04-13 NOTE — Progress Notes (Signed)
Physical Therapy Treatment Patient Details Name: Gina [name] MRN: [id number] DOB: August 02, 1951 Today's Date: 04/13/2012 Time: 4098-1191 PT Time Calculation (min): 20 min  PT Assessment / Plan / Recommendation Comments on Treatment Session  Pt states she feels better.  Asssited to BR then amb in hallway.    Follow Up Recommendations       Barriers to Discharge        Equipment Recommendations       Recommendations for Other Services    Frequency     Plan Discharge plan remains appropriate    Precautions / Restrictions Precautions Precautions: Fall    Pertinent Vitals/Pain No c/o    Mobility  Bed Mobility Bed Mobility: Not assessed Details for Bed Mobility Assistance: Pt OOB in recliner  Transfers Transfers: Sit to Stand;Stand to Sit Sit to Stand: 4: Min guard;From chair/3-in-1;From toilet Stand to Sit: 4: Min guard;To toilet;To chair/3-in-1 Details for Transfer Assistance: increased time and 25% VC's for safety  Ambulation/Gait Ambulation/Gait Assistance: 4: Min guard Ambulation Distance (Feet): 450 Feet Assistive device: Other (Comment) (pushing IV pole) Ambulation/Gait Assistance Details: pt pushed IV pole, no LOB or unsteadiness observed, pt denies any SOB or pain  Gait Pattern: Step-through pattern;Decreased stride length Gait velocity: decreased     PT Goals                    progressing    Visit Information  Last PT Received On: 04/13/12 Assistance Needed: +1                   End of Session PT - End of Session Equipment Utilized During Treatment: Gait belt Activity Tolerance: Patient tolerated treatment well Patient left: in chair;with call bell/phone within reach   Felecia Shelling  PTA [facility]  Acute  Rehab Pager     [phone]

## 2012-04-13 NOTE — Discharge Summary (Signed)
Patient ID: Gina [name] MRN: [id number] DOB/AGE: [birth year] 62 y.o.  Admit date: 04/07/2012 Discharge date: 04/13/2012  Procedures: None  Consults: Internal Medicine  Reason for Admission: Pt presented for elective repair of ventral incisional hernia with mesh by Dr Wyline Mood in [facility] on 7/3. Encountered numerous dense SB adhesions to abd wall and old suture. Over 2hr laparoscopic LOA. Had 20x25 piece of permanent mesh placed. Sent home on POD 1. Bounced back on 7/5 with mild hypotension, SIRS. Ct showed large air-fluid level collection anterior to mesh, thought to be seroma vs hematoma. Treated with abx for "tracheobronchitis" and sent home several days later. Represented around 7/18 with SSI, fever, infection. Admitted to icu. 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 Taken to OR 2 days later for re-exploration, removal of abd mesh, placement of Abthera wound vac. Returned to [facility]. Taken back to OR 2 days later on 7/25 for re-exploration, placement of Allograft bridge b/t fascia edges, partial abd wall closure, and wound vac placement. It was noted that the pt was tolerating a diet throughout the hospitalization and no signs of obstruction on imaging. Noted on last op note - the bowel was covered with granulation tissue and essentially matted together. Eventually sent to SNF for recovery, IV abx of daptomycin and flagyl. Seen in f/u at surgeons office on 8/20, noted to have some skin breakdown around suture site o/w appeared well. Patient presents today after having a fall at home. She apparently had a syncopal episode.. she was seen by EMS and initially refused transport. She is living with her sister, who is also here and being looked after by a niece and nephew. She finally presented to the ER for evaluation after her fall, at which time it was noted she had fluid draining from her mid abdominal incision. She had to open  areas along her midline surgical scar. She has marked erythema and cellulitis of the abdominal wall. She cannot tell us how long she's had the open abdominal wound or the draining fluid from her abdominal wound.  CT of the head and maxillofacial CTs are normal except for some soft tissue swelling and bruising over the right maxilla. CT of the abdomen and pelvis shows an enterocutaneous fistula most likely from small bowel extending to the body surface at the level of the umbilicus. There is a large abscess the anterior abdominal wall which is open to the skin. There is a 2.7 x 1.9 soft tissue right adnexal lesion which may be a possible neoplasm. CT also shows a partial colectomy in the mid transverse colon. It also showed she is status post hysterectomy and left of the rectum he. We were asked to see in consultation.   Admission Diagnoses:  1. Status post partial colectomy with ventral hernia..  2. Status post ventral hernia repair with mesh, 02/04/12.  3. Status post multiple surgeries as noted above with removal of infected mesh, nonhealing abdominal wound, now with enterocutaneous fistula.  4. Bipolar disorder; lives with her sister.  5. Hypertension  6. Sleep apnea with CPAP  7. History of renal insufficiency  8. Obesity  Hospital Course: the patient was admitted and her abscess was opened up.  She was started on abx therapy for this.  NS WD dressing changes were then started BID.  She also had a PICC line placed and TNA was started secondary to an NPO status for an enterocutaneous fistula.  Wound nurse saw  the patient and placed an Eakin's pouch on the patient's fistula to control her drainage.  She did well with this treatment.  Her information was sent out as she was not able to go home as there was no one there to care for all of her needs 24 hrs a day.  She eventually was accepted at [facility] TNA use.  She is otherwise stable and ready for discharge  today.  Discharge Diagnoses:  Principal Problem:  *Enterocutaneous fistula Active Problems:  Colon cancer  Sleep apnea  HTN (hypertension), benign  Bipolar 1 disorder   Discharge Medications: Medication List  As of 04/13/2012 11:19 AM   STOP taking these medications         divalproex 250 MG DR tablet      furosemide 20 MG tablet         TAKE these medications         benztropine 1 MG tablet   Commonly known as: COGENTIN   Take 1 mg by mouth 2 (two) times daily.      calcium carbonate 600 MG Tabs   Commonly known as: OS-CAL   Take 600 mg by mouth 2 (two) times daily with a meal.      chlorproMAZINE 50 MG tablet   Commonly known as: THORAZINE   Take 50 mg by mouth 2 (two) times daily.      cholecalciferol 400 UNITS Tabs   Commonly known as: VITAMIN D   Take 400 Units by mouth daily.      divalproex 125 MG DR tablet   Commonly known as: DEPAKOTE   Take 125 mg by mouth 2 (two) times daily.      FLUoxetine 40 MG capsule   Commonly known as: PROZAC   Take 40 mg by mouth daily.      lovastatin 40 MG tablet   Commonly known as: MEVACOR   Take 40 mg by mouth at bedtime.      metoprolol succinate 25 MG 24 hr tablet   Commonly known as: TOPROL-XL   Take 25 mg by mouth daily.            Discharge Instructions: Follow-up Information    Schedule an appointment as soon as possible for a visit with Atilano Ina, MD,FACS.   Contact information:   [address] [place] [address] [phone]        Normal Saline Wet to dry dressing changes twice a day to her abdominal wound. Please do NOT feed the patient until her enterocutaneous fistula has healed and no further abdominal contents are draining from the fistula.  No further antibiotic therapy is needed at this time.  Signed: Carianne Taira E 04/13/2012, 11:19 AM

## 2012-04-13 NOTE — Progress Notes (Signed)
PARENTERAL NUTRITION CONSULT NOTE   Pharmacy Consult for TNA Indication: Enterocutaneous fistula   Allergies  Allergen Reactions  . Penicillins   . Tetanus Toxoids     Patient Measurements: Height: 5\' 3"  (160 cm) Weight: 168 lb (76.204 kg) IBW/kg (Calculated) : 52.4  Adjusted Body Weight: 61.9   Vital Signs: Temp: 97.7 F (36.5 C) (09/10 0609) Temp src: Axillary (09/10 0609) BP: 131/62 mmHg (09/10 0609) Pulse Rate: 53  (09/10 0609) Intake/Output from previous day: 09/09 0701 - 09/10 0700 In: 1465 [I.V.:1465] Out: 2695 [Urine:2675; Drains:20] Intake/Output from this shift:    Labs:  [facility] 04/12/12 0410  WBC 7.6  HGB 9.2*  HCT 28.9*  PLT 287  APTT --  INR --     Basename 04/12/12 0410 04/11/12 0500  NA 139 137  K 4.7 4.9  CL 107 106  CO2 23 24  GLUCOSE 135* 127*  BUN 24* 23  CREATININE 0.90 0.95  LABCREA -- --  CREAT24HRUR -- --  CALCIUM 8.9 8.6  MG 2.0 --  PHOS 3.9 --  PROT 6.1 --  ALBUMIN 2.2* --  AST 12 --  ALT 7 --  ALKPHOS 63 --  BILITOT 0.1* --  BILIDIR -- --  IBILI -- --  PREALBUMIN 16.3* --  TRIG 195* --  CHOLHDL -- --  CHOL 107 --   Estimated Creatinine Clearance: 64.1 ml/min (by C-G formula based on Cr of 0.9).    Medications:  Scheduled:     . aztreonam  1 g Intravenous Q8H  . benztropine  1 mg Oral BID  . chlorproMAZINE  50 mg Oral BID  . DAPTOmycin (CUBICIN)  IV  6 mg/kg Intravenous Q24H  . divalproex  125 mg Oral BID  . divalproex  250 mg Oral BID  . FLUoxetine  40 mg Oral Daily  . heparin  5,000 Units Subcutaneous Q8H  . insulin aspart  0-9 Units Subcutaneous Q6H  . metoprolol succinate  12.5 mg Oral Daily  . metronidazole  500 mg Intravenous Q8H  . multivitamin with minerals  1 tablet Oral Daily  . pantoprazole (PROTONIX) IV  40 mg Intravenous QHS   Infusions:     . 0.9 % NaCl with KCl 20 mEq / L 60 mL/hr at 04/13/12 0144  . TPN (CLINIMIX) +/- additives 70 mL/hr at 04/12/12 1809   And  . fat emulsion 250  mL (04/12/12 1810)  . TPN (CLINIMIX) +/- additives 70 mL/hr at 04/11/12 1746   PRN: acetaminophen, acetaminophen, diphenhydrAMINE, diphenhydrAMINE, HYDROcodone-acetaminophen, morphine injection, ondansetron, sodium chloride  Insulin Requirements in the past 24 hours:  CBG: 150, 119, 133, 162, 136 5 units Novolog (sensitive SSI q6h).  Nutritional Goals:  Estimated nutritional Needs per RD note 9/5 Kcal: 1550-1800 Protein: 80-105 Fluid: 1.5-1.8L  Clinimix E 5/20 at Goal 70 ml/hr will provide 84 grams protein, 1478 KCals on nonlipid days and 1958 on lipid days providing an average of 1684 KCals/day/week.   Current Nutrition:  Clinimix E 5/20 at 70 ml/hr. Diet: NPO, sips with meds, Ice Chips MIVF: NS with 20 mEq KCL/L @ 60 ml/hr  Labs: Electrolytes: wnl, stable (9/9) Renal: Scr improved, wnl (9/9) Liver: LFTs wnl except Bili low (9/9) CBGs: controlled TGs: slightly rising, 145 (9/6), 195 (9/9) Prealbumin: improving, 9.8 (9/6), 16.3 (9/9)    Assessment:  61 y/o F had elective ventral incisional hernia repair with mesh on 02/04/12.  Since hernia repair has had multiple surgical interventions for VRE/MRSA mesh infection (See surgeon note for details).  Now with enterocutaneous fistula found on admission, made NPO and TNA started 9/5.  Tolerating TNA at goal formula and rate.   Plan:  1) Continue Clinimix E 5/20 at goal rate of 70 ml/hr. 2) Continue Fat emulsion 20% at 10 ml/hr on MWF only (national backorder) 3) Since tolerating PO meds, we will provide MVI and trace elements via the enteral route d/t ongoing shortages of the injectable dosage forms. 4) Routine TNA labs on Mondays and Thursdays   Rollene Fare, PharmD, BCPS Pager: [phone] 04/13/2012 7:09 AM

## 2012-04-13 NOTE — Progress Notes (Signed)
Continue tna and npo for conservative mgt of this ecf.  This should continue for several months and see if heals like this.  Then there would be role for surgery at later date. Can go to [facility] today

## 2012-04-13 NOTE — Progress Notes (Signed)
Patient ID: Gina [name], female   DOB: 1950-09-13, 61 y.o.   MRN: [id number]    Subjective: Pt c/o incontinence.  Doesn't want to take a fluid pill, but she is not on one.  Otherwise no issues.  Objective: Vital signs in last 24 hours: Temp:  [97.7 F (36.5 C)-97.9 F (36.6 C)] 97.7 F (36.5 C) (09/10 0609) Pulse Rate:  [53-63] 53  (09/10 0609) Resp:  [16-18] 18  (09/10 0609) BP: (92-131)/(39-67) 131/62 mmHg (09/10 0609) SpO2:  [96 %-97 %] 96 % (09/10 0609) Last BM Date: 04/11/12  Intake/Output from previous day: 09/09 0701 - 09/10 0700 In: 1465 [I.V.:1465] Out: 2695 [Urine:2675; Drains:20] Intake/Output this shift:    PE: Abd: soft, Nt, still with enteric output from fistula, wound is clean and packed.  Lab Results:   [facility] 04/12/12 0410  WBC 7.6  HGB 9.2*  HCT 28.9*  PLT 287   BMET  Basename 04/12/12 0410 04/11/12 0500  NA 139 137  K 4.7 4.9  CL 107 106  CO2 23 24  GLUCOSE 135* 127*  BUN 24* 23  CREATININE 0.90 0.95  CALCIUM 8.9 8.6   PT/INR No results found for this basename: LABPROT:2,INR:2 in the last 72 hours CMP     Component Value Date/Time   NA 139 04/12/2012 0410   K 4.7 04/12/2012 0410   CL 107 04/12/2012 0410   CO2 23 04/12/2012 0410   GLUCOSE 135* 04/12/2012 0410   BUN 24* 04/12/2012 0410   CREATININE 0.90 04/12/2012 0410   CALCIUM 8.9 04/12/2012 0410   PROT 6.1 04/12/2012 0410   ALBUMIN 2.2* 04/12/2012 0410   AST 12 04/12/2012 0410   ALT 7 04/12/2012 0410   ALKPHOS 63 04/12/2012 0410   BILITOT 0.1* 04/12/2012 0410   GFRNONAA 68* 04/12/2012 0410   GFRAA 78* 04/12/2012 0410   Lipase     Component Value Date/Time   LIPASE 9* 04/07/2012 1000       Studies/Results: No results found.  Anti-infectives: Anti-infectives     Start     Dose/Rate Route Frequency Ordered Stop   04/08/12 2200   aztreonam (AZACTAM) 1 g in dextrose 5 % 50 mL IVPB        1 g 100 mL/hr over 30 Minutes Intravenous 3 times per day 04/08/12 1845     04/08/12 2200   DAPTOmycin  (CUBICIN) 457 mg in sodium chloride 0.9 % IVPB        6 mg/kg  76.2 kg 218.3 mL/hr over 30 Minutes Intravenous Every 24 hours 04/08/12 1900     04/07/12 2000   clindamycin (CLEOCIN) IVPB 300 mg  Status:  Discontinued        300 mg 100 mL/hr over 30 Minutes Intravenous Every 8 hours 04/07/12 1628 04/08/12 1845   04/07/12 1800   vancomycin (VANCOCIN) 750 mg in sodium chloride 0.9 % 150 mL IVPB  Status:  Discontinued        750 mg 150 mL/hr over 60 Minutes Intravenous Every 12 hours 04/07/12 1710 04/08/12 1845   04/07/12 1700   metroNIDAZOLE (FLAGYL) IVPB 500 mg        500 mg 100 mL/hr over 60 Minutes Intravenous Every 8 hours 04/07/12 1628     04/07/12 1545   metroNIDAZOLE (FLAGYL) IVPB 500 mg  Status:  Discontinued        500 mg 100 mL/hr over 60 Minutes Intravenous  Once 04/07/12 1539 04/07/12 1541   04/07/12 1530   metroNIDAZOLE (FLAGYL) IVPB 500  mg        500 mg 100 mL/hr over 60 Minutes Intravenous  Once 04/07/12 1519 04/07/12 1647   04/07/12 1000   clindamycin (CLEOCIN) IVPB 600 mg        600 mg 100 mL/hr over 30 Minutes Intravenous  Once 04/07/12 0948 04/07/12 1245           Assessment/Plan  1. EC fistula, s/p complex surgical history 2. PCM/TNA  Plan: 1. Cont NPO and TNA.   2. Awaiting placement from either LTAC or SNF for further care while on TNA.   LOS: 6 days    Maddie Brazier E 04/13/2012

## 2012-04-14 LAB — CBC WITH DIFFERENTIAL/PLATELET
Basophils Absolute: 0.1 10*3/uL (ref 0.0–0.1)
Lymphocytes Relative: 29 % (ref 12–46)
Lymphs Abs: 2.7 10*3/uL (ref 0.7–4.0)
MCV: 92.9 fL (ref 78.0–100.0)
Neutro Abs: 5.4 10*3/uL (ref 1.7–7.7)
Neutrophils Relative %: 57 % (ref 43–77)
Platelets: 280 10*3/uL (ref 150–400)
RBC: 3.12 MIL/uL — ABNORMAL LOW (ref 3.87–5.11)
RDW: 14.7 % (ref 11.5–15.5)
WBC: 9.5 10*3/uL (ref 4.0–10.5)

## 2012-04-14 LAB — PROTIME-INR
INR: 1.22 (ref 0.00–1.49)
Prothrombin Time: 15.7 seconds — ABNORMAL HIGH (ref 11.6–15.2)

## 2012-04-14 LAB — COMPREHENSIVE METABOLIC PANEL
ALT: 10 U/L (ref 0–35)
AST: 13 U/L (ref 0–37)
Calcium: 9.1 mg/dL (ref 8.4–10.5)
Creatinine, Ser: 0.93 mg/dL (ref 0.50–1.10)
GFR calc non Af Amer: 65 mL/min — ABNORMAL LOW (ref >90)
Sodium: 136 mEq/L (ref 135–145)
Total Protein: 6.1 g/dL (ref 6.0–8.3)

## 2012-04-14 LAB — APTT: aPTT: 36 seconds (ref 24–37)

## 2012-04-14 LAB — VALPROIC ACID LEVEL: Valproic Acid Lvl: 23.3 ug/mL — ABNORMAL LOW (ref 50.0–100.0)

## 2012-04-14 LAB — HEMOGLOBIN A1C
Hgb A1c MFr Bld: 5.6 % (ref <5.7)
Mean Plasma Glucose: 114 mg/dL (ref <117)

## 2012-04-16 NOTE — H&P (Signed)
Reason for Consult: EC fistula Referring Physician: *Dr. Oneida Arenas Hipps is an 61 y.o. female.   HPI: Patient is a 61 year old female who thoroughly moved from [place] to [place] on 04/03/12. She is bipolar with history of depression on a large amount of medicine normally. She has a history of a partial colectomy for a colon cancer approximately 2008. No other is exactly sure when this occurred. She was seen in [place] at [facility] on 02/04/2012 at which time she underwent lysis of adhesions and ventral hernia repair with mesh. She was apparently discharged and returned the following day with SIRS, hypotension, and acute renal insufficiency. On 02/19/12 she underwent incision and drainage were abdominal wound. She then returned to the OR on 02/24/2012 with removal of infected mesh and placement of an open abdominal wound VAC system. On 02/26/2012 she underwent re-exploration of her abdominal wound washout and allograft placement of the open abdominal wall. She was then discharged to a skilled nursing facility. 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 Patient presents today after having a fall at home. She apparently had a syncopal episode.. she was seen by EMS and initially refused transport. She is living with her sister, who is also here and being looked after by a niece and nephew. She finally presented to the ER for evaluation after her fall, at which time it was noted she had fluid draining from her mid abdominal incision. She had to open areas along her midline surgical scar. She has marked erythema and cellulitis of the abdominal wall. She cannot tell us how long she's had the open abdominal wound or the draining fluid from her abdominal wound. CT of the head and maxillofacial CTs are normal except for some soft tissue swelling and bruising over the right maxilla. CT of the abdomen and pelvis shows an enterocutaneous fistula most likely from small bowel extending to  the body surface at the level of the umbilicus. There is a large abscess the anterior abdominal wall which is open to the skin. There is a 2.7 x 1.9 soft tissue right adnexal lesion which may be a possible neoplasm. CT also shows a partial colectomy in the mid transverse colon. It also showed she is status post hysterectomy and left of the rectum he. We were asked to see in consultation.      Past Medical History   Diagnosis  Date   .  Hypertension     .  Bipolar 1 disorder     .  Depression     .  Sleep apnea     .  CRI (chronic renal insufficiency)     .  Obesity (BMI 30-39.9)         Past Surgical History   Procedure  Date   .   laparoscopic repair of incarcerated hernia with 25 polypropylene carotid mesh. Enterolysis   02/04/12   .  Partial colectomy  about 2008       for colon cancer   .   decompression of abdominal wall process with irrigation culture and drainage.   02/19/12   .   exploration of abdominal wall, Removal of infected mesh and abdominal wound vac placement  02/24/12   .  Reexploration of abdominal wound and allograft placement, partial closure of skin and subcutaneous tissue and application of wound VAC negative pressure dressing.   02/26/12   .  Hysterectomy and left oopherectomy         date unknown  No family history on file.   Social History: does not have a smoking history on file. She does not have any smokeless tobacco history on file. Her alcohol and drug histories not on file.   Allergies:  Allergies   Allergen  Reactions   .  Penicillins     .  Tetanus Toxoids      Prior to Admission medications    Medication  Sig  Start Date  End Date  Taking?  Authorizing Provider   benztropine (COGENTIN) 1 MG tablet  Take 1 mg by mouth 2 (two) times daily.      Yes  Historical Provider, MD   calcium carbonate (OS-CAL) 600 MG TABS  Take 600 mg by mouth 2 (two) times daily with a meal.      Yes  Historical Provider, MD   chlorproMAZINE (THORAZINE) 50 MG  tablet  Take 50 mg by mouth 2 (two) times daily.      Yes  Historical Provider, MD   cholecalciferol (VITAMIN D) 400 UNITS TABS  Take 400 Units by mouth daily.      Yes  Historical Provider, MD   divalproex (DEPAKOTE) 125 MG DR tablet  Take 125 mg by mouth 2 (two) times daily.      Yes  Historical Provider, MD   divalproex (DEPAKOTE) 250 MG DR tablet  Take 250 mg by mouth 2 (two) times daily.      Yes  Historical Provider, MD   FLUoxetine (PROZAC) 40 MG capsule  Take 40 mg by mouth daily.      Yes  Historical Provider, MD   furosemide (LASIX) 20 MG tablet  Take 20 mg by mouth daily.      Yes  Historical Provider, MD   lovastatin (MEVACOR) 40 MG tablet  Take 40 mg by mouth at bedtime.      Yes  Historical Provider, MD   metoprolol succinate (TOPROL-XL) 25 MG 24 hr tablet  Take 25 mg by mouth daily.      Yes  Historical Provider, MD        Medications:  Prior to Admission:  (Not in a hospital admission) Scheduled:     .  clindamycin (CLEOCIN) IV   600 mg  Intravenous  Once   .  metronidazole   500 mg  Intravenous  Once   .  sodium chloride   1,000 mL  Intravenous  Once   .  DISCONTD: metronidazole   500 mg  Intravenous  Once    Continuous:  JOA:CZYSAYT Anti-infectives        Start        Dose/Rate  Route  Frequency  Ordered  Stop     04/07/12 1545      metroNIDAZOLE (FLAGYL) IVPB 500 mg  Status:  Discontinued             500 mg  100 mL/hr over 60 Minutes  Intravenous   Once  04/07/12 1539  04/07/12 1541     04/07/12 1530      metroNIDAZOLE (FLAGYL) IVPB 500 mg             500 mg  100 mL/hr over 60 Minutes  Intravenous   Once  04/07/12 1519        04/07/12 1000      clindamycin (CLEOCIN) IVPB 600 mg             600 mg  100 mL/hr over 30 Minutes  Intravenous   Once  04/07/12  0948  04/07/12 1245                 Results for orders placed during the hospital encounter of 04/07/12 (from the past 48 hour(s))   CBC WITH DIFFERENTIAL     Status: Abnormal     Collection Time      04/07/12 10:00 AM       Component  Value  Range  Comment     WBC  11.0 (*)  4.0 - 10.5 K/uL       RBC  3.50 (*)  3.87 - 5.11 MIL/uL       Hemoglobin  10.4 (*)  12.0 - 15.0 g/dL       HCT  31.8 (*)  36.0 - 46.0 %       MCV  90.9   78.0 - 100.0 fL       MCH  29.7   26.0 - 34.0 pg       MCHC  32.7   30.0 - 36.0 g/dL       RDW  14.2   11.5 - 15.5 %       Platelets      150 - 400 K/uL       Value:  PLATELET CLUMPS NOTED ON SMEAR, COUNT APPEARS ADEQUATE     Neutrophils Relative  72   43 - 77 %       Lymphocytes Relative  14   12 - 46 %       Monocytes Relative  12   3 - 12 %       Eosinophils Relative  2   0 - 5 %       Basophils Relative  0   0 - 1 %       Neutro Abs  8.0 (*)  1.7 - 7.7 K/uL       Lymphs Abs  1.5   0.7 - 4.0 K/uL       Monocytes Absolute  1.3 (*)  0.1 - 1.0 K/uL       Eosinophils Absolute  0.2   0.0 - 0.7 K/uL       Basophils Absolute  0.0   0.0 - 0.1 K/uL       WBC Morphology  WHITE COUNT CONFIRMED ON SMEAR          Smear Review        COUNT MAY BE INACCURATE DUE TO FIBRIN CLUMPS.     Value:  PLATELET CLUMPS NOTED ON SMEAR, COUNT APPEARS ADEQUATE   COMPREHENSIVE METABOLIC PANEL     Status: Abnormal     Collection Time     04/07/12 10:00 AM       Component  Value  Range  Comment     Sodium  137   135 - 145 mEq/L       Potassium  4.3   3.5 - 5.1 mEq/L       Chloride  99   96 - 112 mEq/L       CO2  25   19 - 32 mEq/L       Glucose, Bld  86   70 - 99 mg/dL       BUN  26 (*)  6 - 23 mg/dL       Creatinine, Ser  1.17 (*)  0.50 - 1.10 mg/dL       Calcium  9.4   8.4 - 10.5 mg/dL       Total Protein  7.4   6.0 - 8.3 g/dL       Albumin  2.7 (*)  3.5 - 5.2 g/dL       AST  13   0 - 37 U/L       ALT  7   0 - 35 U/L       Alkaline Phosphatase  67   39 - 117 U/L       Total Bilirubin  0.2 (*)  0.3 - 1.2 mg/dL       GFR calc non Af Amer  49 (*)  >90 mL/min       GFR calc Af Amer  57 (*)  >90 mL/min     LIPASE, BLOOD     Status: Abnormal     Collection Time     04/07/12 10:00 AM        Component  Value  Range  Comment     Lipase  9 (*)  11 - 59 U/L     TROPONIN I     Status: Normal     Collection Time     04/07/12 10:00 AM       Component  Value  Range  Comment     Troponin I  <0.30   <0.30 ng/mL     URINALYSIS, ROUTINE W REFLEX MICROSCOPIC     Status: Normal     Collection Time     04/07/12 11:04 AM       Component  Value  Range  Comment     Color, Urine  YELLOW   YELLOW       APPearance  CLEAR   CLEAR       Specific Gravity, Urine  1.011   1.005 - 1.030       pH  6.5   5.0 - 8.0       Glucose, UA  NEGATIVE   NEGATIVE mg/dL       Hgb urine dipstick  NEGATIVE   NEGATIVE       Bilirubin Urine  NEGATIVE   NEGATIVE       Ketones, ur  NEGATIVE   NEGATIVE mg/dL       Protein, ur  NEGATIVE   NEGATIVE mg/dL       Urobilinogen, UA  0.2   0.0 - 1.0 mg/dL       Nitrite  NEGATIVE   NEGATIVE       Leukocytes, UA  NEGATIVE   NEGATIVE  MICROSCOPIC NOT DONE ON URINES WITH NEGATIVE PROTEIN, BLOOD, LEUKOCYTES, NITRITE, OR GLUCOSE <1000 mg/dL.   TROPONIN I     Status: Normal     Collection Time     04/07/12  1:00 PM       Component  Value  Range  Comment     Troponin I  <0.30   <0.30 ng/mL        Ct Head Wo Contrast   04/07/2012  *RADIOLOGY REPORT*  Clinical Data:  Status post fall, right orbital/chin bruising/hematoma  CT HEAD WITHOUT CONTRAST CT MAXILLOFACIAL WITHOUT CONTRAST  Technique:  Multidetector CT imaging of the head and maxillofacial structures were performed using the standard protocol without intravenous contrast. Multiplanar CT image reconstructions of the maxillofacial structures were also generated.  Comparison:  None.  CT HEAD  Findings: No evidence of parenchymal hemorrhage or extra-axial fluid collection. No mass lesion, mass effect, or midline shift.  No CT evidence of acute infarction.  Mild global cortical atrophy.  No ventriculomegaly.  Calcifications in  the right basal ganglia and cerebellum.  No evidence of calvarial fracture.  IMPRESSION: No evidence of  acute intracranial abnormality.  CT MAXILLOFACIAL  Findings:   No evidence of maxillofacial fracture.  The visualized paranasal sinuses are essentially clear. The mastoid air cells are unopacified.  The bilateral orbits, including the retroconal soft tissues, are within normal limits.  Mild soft tissue swelling/bruising overlying the right maxilla (series 4/image 28).  The mandible, including the mandibular condyles, are within normal limits.  Degenerative changes of the cervical spine to C4-5.  IMPRESSION: Mild soft tissue swelling/bruising overlying the right maxilla.  No evidence of maxillofacial fracture.   Original Report Authenticated By: Julian Hy, M.D.     Ct Abdomen Pelvis W Contrast   04/07/2012  *RADIOLOGY REPORT*  Clinical Data: Abdominal pain.  Status post recent hernia repair. Possible wound infection.  CT ABDOMEN AND PELVIS WITH CONTRAST  Technique:  Multidetector CT imaging of the abdomen and pelvis was performed following the standard protocol during bolus administration of intravenous contrast.  Contrast: 144m OMNIPAQUE IOHEXOL 300 MG/ML  SOLN  Comparison: No priors.  Findings:  Lung Bases: Cardiomegaly.  Otherwise, unremarkable.  Abdomen/Pelvis:  The enhanced appearance of the liver, gallbladder, pancreas, spleen and bilateral adrenal glands is unremarkable. There is mild multifocal parenchymal thinning in the kidneys, most compatible with scarring.  Additionally, there are multiple tiny subcentimeter low attenuation lesions in the kidneys bilaterally which are too small to definitively characterize, but are statistically likely to represent small cysts.  The largest of these measures 9 mm in the upper pole of the right kidney.  There are postoperative changes in the transverse colon, suggesting prior partial colectomy.  The patient is also status post hysterectomy and left salpingo-oophorectomy.  A 2.7 x 1.9 cm partially calcified soft tissue attenuation (48 HU) lesion in the right  adnexa is likely ovarian in etiology.  No significant volume of ascites.  No pneumoperitoneum.  No pathologic distension of small bowel.  There are numerous small bowel loops which appear intimately associated with the anterior abdominal wall.  Musculoskeletal: In the subcutaneous fat of the anterior abdominal wall immediately above and around the umbilicus there is a large rim enhancing fluid collection that measures approximately 10.7 x 7.2 x 2.0 cm, concerning for a superficial abscess.  At the level of the umbilicus, there appears to be a frank defect in the subcutaneous soft tissues of the abdomen which extends to the underlying abdominal wall musculature, and in this region there is pooling of the oral contrast material.  This is indicative of an enterocutaneous fistula (despite the fact that no definite fistulous tract is clearly identified on this examination).  There are no aggressive appearing lytic or blastic lesions noted in the visualized portions of the skeleton.  IMPRESSION: 1.  Findings, as above, compatible with an enterocutaneous fistula, likely from the small bowel, which extends to the body surface at the level of the umbilicus. 2.  In addition, there is a large abscess in the anterior abdominal wall subcutaneous fat immediately above the umbilicus, as above. 3.  2.7 x 1.9 cm soft tissue attenuation lesion in the right adnexa is likely ovarian in etiology.  This could be better evaluated with a non emergent transvaginal ultrasound to exclude neoplasm. 4.  Status post partial colectomy in the mid transverse colon.  The patient is also status post hysterectomy and left oophorectomy. 5.  Additional incidental findings, as above.   Original Report Authenticated By: DEtheleen Mayhew M.D.  Ct Maxillofacial Wo Cm   04/07/2012  *RADIOLOGY REPORT*  Clinical Data:  Status post fall, right orbital/chin bruising/hematoma  CT HEAD WITHOUT CONTRAST CT MAXILLOFACIAL WITHOUT CONTRAST  Technique:   Multidetector CT imaging of the head and maxillofacial structures were performed using the standard protocol without intravenous contrast. Multiplanar CT image reconstructions of the maxillofacial structures were also generated.  Comparison:  None.  CT HEAD  Findings: No evidence of parenchymal hemorrhage or extra-axial fluid collection. No mass lesion, mass effect, or midline shift.  No CT evidence of acute infarction.  Mild global cortical atrophy.  No ventriculomegaly.  Calcifications in the right basal ganglia and cerebellum.  No evidence of calvarial fracture.  IMPRESSION: No evidence of acute intracranial abnormality.  CT MAXILLOFACIAL  Findings:   No evidence of maxillofacial fracture.  The visualized paranasal sinuses are essentially clear. The mastoid air cells are unopacified.  The bilateral orbits, including the retroconal soft tissues, are within normal limits.  Mild soft tissue swelling/bruising overlying the right maxilla (series 4/image 28).  The mandible, including the mandibular condyles, are within normal limits.  Degenerative changes of the cervical spine to C4-5.  IMPRESSION: Mild soft tissue swelling/bruising overlying the right maxilla.  No evidence of maxillofacial fracture.   Original Report Authenticated By: Julian Hy, M.D.      Review of Systems  Constitutional: Negative for fever, chills and weight loss.        Pt is bipolar and has difficulty remembering many details, and frequently cannot answer questions.  HENT: Negative.  Negative for neck pain.   Eyes: Negative.   Respiratory:        She has sleep apnea and uses a CPAP machine.  Cardiovascular: Negative.   Gastrointestinal: Positive for heartburn (occasional ) and constipation (occasional). Negative for nausea, vomiting, abdominal pain, diarrhea, blood in stool and melena.  Genitourinary: Positive for urgency and frequency.  Musculoskeletal: Negative for back pain, joint pain and falls.        Has trouble walking    Skin:        She's not sure how long abdomen has been red.  Neurological: Positive for dizziness and loss of consciousness (passed out at home.). Negative for tingling, tremors, sensory change, speech change, focal weakness and seizures.  Endo/Heme/Allergies: Negative.   Psychiatric/Behavioral: Positive for depression. Negative for suicidal ideas, hallucinations, memory loss and substance abuse. The patient is nervous/anxious. The patient does not have insomnia.         Family says she's been confused since moving down here 04/03/12.  Blood pressure 124/53, pulse 56, temperature 97.8 F (36.6 C), temperature source Oral, resp. rate 21, SpO2 99.00%. Physical Exam  Constitutional: She is oriented to person, place, and time. She appears well-developed. No distress.       Obese WF, NAD  HENT:   Head: Normocephalic and atraumatic.       MULTIPLE TEETH MISSING. Ecchymosis right eye Bruising right chin  Eyes: Conjunctivae and EOM are normal. Pupils are equal, round, and reactive to light. Right eye exhibits no discharge. Left eye exhibits no discharge. No scleral icterus.  Neck: Normal range of motion. Neck supple. No JVD present. No tracheal deviation present. No thyromegaly present.  Cardiovascular: Normal rate, regular rhythm, normal heart sounds and intact distal pulses.  Exam reveals no gallop.    No murmur heard. Respiratory: Effort normal and breath sounds normal. No stridor. No respiratory distress. She has no wheezes. She has no rales. She exhibits no tenderness.  GI:  Soft. Bowel sounds are normal. She exhibits no distension (minimal tenderness) and no mass. There is tenderness. There is no rebound and no guarding.       Large midline surgical scar.  Open 1.5 x 2.0 cm area below xyphoid, purulent necrotic fat appearing material.3  2nd open area 3 x 2 cm mid line 4-5 cm below the 1st open area surrounded by 10 cm of erythema.  Draining green colored intestinal fluid.  This is a large  amount of drainage.  Genitourinary:       No skin break down on back, or  Buttocks. No bruising on her back or side noted.  She does have some external hemorrhoids.  Musculoskeletal: She exhibits no edema.  Lymphadenopathy:    She has no cervical adenopathy.  Neurological: She is alert and oriented to person, place, and time. No cranial nerve deficit.  Skin: Skin is warm and dry. No rash noted. She is not diaphoretic. No erythema.       Skin changes from bruising right face and chin. Erythema from her abdominal wound.  Psychiatric: She has a normal mood and affect. Her behavior is normal.       She has a very poor recollection of her history and cannot really tell me even how long she has had fluid draining from her abdomen.    Assessment/Plan: 1. Status post partial colectomy with ventral hernia.. 2. Status post ventral hernia repair with mesh, 02/04/12. 3. Status post multiple surgeries as noted above with removal of infected mesh, nonhealing abdominal wound, now with enterocutaneous fistula. 4. Bipolar disorder; lives with her sister. 5. Hypertension 6. Sleep apnea with CPAP 7. History of renal insufficiency 8. Obesity Plan: We'll admit the patient, she will need a PICC line in, start TNA, start antibiotics, and medical management at this time. We will keep  Her on her oral bipolar meds for the time being. Medicine will see in consultation to help with her bipolar disease and medical issues.   Psychiatrist: Dr. Renaee Munda, [place] [phone] Surgeon: Dr. Shirlean Kelly Debera Lat, MD   Zafiro Routson 04/07/2012, 3:21 PM            Cosigned by: Gayland Curry, MD,FACS    [04/07/2012 10:30 PM]

## 2012-04-17 LAB — BASIC METABOLIC PANEL WITH GFR
BUN: 25 mg/dL — ABNORMAL HIGH (ref 6–23)
CO2: 22 meq/L (ref 19–32)
Calcium: 9.2 mg/dL (ref 8.4–10.5)
Chloride: 108 meq/L (ref 96–112)
Creatinine, Ser: 0.91 mg/dL (ref 0.50–1.10)
GFR calc Af Amer: 77 mL/min — ABNORMAL LOW
GFR calc non Af Amer: 67 mL/min — ABNORMAL LOW
Glucose, Bld: 132 mg/dL — ABNORMAL HIGH (ref 70–99)
Potassium: 5.2 meq/L — ABNORMAL HIGH (ref 3.5–5.1)
Sodium: 137 meq/L (ref 135–145)

## 2012-04-17 LAB — CK: Total CK: 33 U/L (ref 7–177)

## 2012-04-18 LAB — CBC
HCT: 30.9 % — ABNORMAL LOW (ref 36.0–46.0)
Hemoglobin: 9.8 g/dL — ABNORMAL LOW (ref 12.0–15.0)
MCHC: 31.7 g/dL (ref 30.0–36.0)
RDW: 15.5 % (ref 11.5–15.5)
WBC: 9.5 10*3/uL (ref 4.0–10.5)

## 2012-04-18 LAB — BASIC METABOLIC PANEL
Chloride: 108 mEq/L (ref 96–112)
GFR calc Af Amer: 83 mL/min — ABNORMAL LOW (ref >90)
GFR calc non Af Amer: 71 mL/min — ABNORMAL LOW (ref >90)
Potassium: 4.9 mEq/L (ref 3.5–5.1)
Sodium: 139 mEq/L (ref 135–145)

## 2012-04-18 NOTE — H&P (Signed)
I saw the patient, participated in the history, exam and medical decision making, and concur with the physician assistant's note above.  Gina [name]. [name], MD, FACS General, Bariatric, & Minimally Invasive Surgery [facility], [place]

## 2012-04-21 ENCOUNTER — Other Ambulatory Visit (HOSPITAL_COMMUNITY): Payer: Self-pay

## 2012-04-21 DIAGNOSIS — K632 Fistula of intestine: Secondary | ICD-10-CM

## 2012-04-21 DIAGNOSIS — K5900 Constipation, unspecified: Secondary | ICD-10-CM | POA: Diagnosis not present

## 2012-04-21 DIAGNOSIS — Z5189 Encounter for other specified aftercare: Secondary | ICD-10-CM | POA: Diagnosis not present

## 2012-04-21 DIAGNOSIS — R109 Unspecified abdominal pain: Secondary | ICD-10-CM | POA: Diagnosis not present

## 2012-04-21 LAB — COMPREHENSIVE METABOLIC PANEL
ALT: 15 U/L (ref 0–35)
AST: 16 U/L (ref 0–37)
Albumin: 2.6 g/dL — ABNORMAL LOW (ref 3.5–5.2)
Alkaline Phosphatase: 92 U/L (ref 39–117)
CO2: 25 mEq/L (ref 19–32)
Chloride: 107 mEq/L (ref 96–112)
GFR calc non Af Amer: 64 mL/min — ABNORMAL LOW (ref >90)
Potassium: 4.9 mEq/L (ref 3.5–5.1)
Total Bilirubin: 0.2 mg/dL — ABNORMAL LOW (ref 0.3–1.2)

## 2012-04-21 MED ORDER — IOHEXOL 300 MG/ML  SOLN
100.0000 mL | Freq: Once | INTRAMUSCULAR | Status: AC | PRN
  Administered 2012-04-21: 100 mL via INTRAVENOUS

## 2012-04-22 LAB — CBC WITH DIFFERENTIAL/PLATELET
Basophils Relative: 1 % (ref 0–1)
Eosinophils Absolute: 0.4 10*3/uL (ref 0.0–0.7)
Eosinophils Relative: 6 % — ABNORMAL HIGH (ref 0–5)
Lymphs Abs: 2.2 10*3/uL (ref 0.7–4.0)
MCH: 29.9 pg (ref 26.0–34.0)
MCHC: 32.6 g/dL (ref 30.0–36.0)
MCV: 91.9 fL (ref 78.0–100.0)
Platelets: 172 10*3/uL (ref 150–400)
RBC: 3.34 MIL/uL — ABNORMAL LOW (ref 3.87–5.11)
RDW: 16.3 % — ABNORMAL HIGH (ref 11.5–15.5)

## 2012-04-22 LAB — BASIC METABOLIC PANEL
Calcium: 9.5 mg/dL (ref 8.4–10.5)
GFR calc Af Amer: 82 mL/min — ABNORMAL LOW (ref >90)
GFR calc non Af Amer: 70 mL/min — ABNORMAL LOW (ref >90)
Glucose, Bld: 121 mg/dL — ABNORMAL HIGH (ref 70–99)
Sodium: 135 mEq/L (ref 135–145)

## 2012-04-22 NOTE — Progress Notes (Signed)
Patient ID: Gina [name], female   DOB: 1950-08-18, 61 y.o.   MRN: [id number]    Subjective: Pt wo complaints, denies n/v, abd pain.  Objective:    PE: Abd: soft, nontender, midline eakins pouch with enteric drainage approx 50cc in container but no records are on chart for how much has been putting out  Lab Results:   [facility] 04/22/12 0529  WBC 7.6  HGB 10.0*  HCT 30.7*  PLT 172   BMET  Basename 04/22/12 0529 04/21/12 0522  NA 135 140  K 4.7 4.9  CL 103 107  CO2 22 25  GLUCOSE 121* 110*  BUN 30* 31*  CREATININE 0.87 0.94  CALCIUM 9.5 9.7   PT/INR No results found for this basename: LABPROT:2,INR:2 in the last 72 hours CMP     Component Value Date/Time   NA 135 04/22/2012 0529   K 4.7 04/22/2012 0529   CL 103 04/22/2012 0529   CO2 22 04/22/2012 0529   GLUCOSE 121* 04/22/2012 0529   BUN 30* 04/22/2012 0529   CREATININE 0.87 04/22/2012 0529   CALCIUM 9.5 04/22/2012 0529   PROT 6.4 04/21/2012 0522   ALBUMIN 2.6* 04/21/2012 0522   AST 16 04/21/2012 0522   ALT 15 04/21/2012 0522   ALKPHOS 92 04/21/2012 0522   BILITOT 0.2* 04/21/2012 0522   GFRNONAA 70* 04/22/2012 0529   GFRAA 82* 04/22/2012 0529   Lipase     Component Value Date/Time   LIPASE 9* 04/07/2012 1000       Studies/Results: Ct Abdomen Pelvis W Contrast  04/21/2012  *RADIOLOGY REPORT*  Clinical Data: Abdominal pain constipation. Laparoscopic hernia repair on 02/04/2012.  Status post removal of infected mesh on 02/24/2012.  Prior hysterectomy and left oophorectomy.  CT ABDOMEN AND PELVIS WITH CONTRAST  Technique:  Multidetector CT imaging of the abdomen and pelvis was performed following the standard protocol during bolus administration of intravenous contrast.  Contrast: OMNIPAQUE IOHEXOL 300 MG/ML  SOLN  Comparison: 04/07/2012  Findings: No focal abnormalities seen in the liver or spleen.  The stomach, duodenum, pancreas, and adrenal glands are unremarkable. Gallbladder is unremarkable. Persistent fetal  lobation in the kidneys without hydronephrosis or mass lesion.  A small exophytic cyst at the lower pole of the left kidney.  No abdominal aortic aneurysm.  No abdominal lymphadenopathy.  No free fluid in the abdomen.  Midline wound is evident with gas in the superficial extra fascial soft tissues.  Gas tracks deep into the region of the midline rectus sheath, but there is no evidence for gas or fluid deep to the deep rectus fascia.  Although there is no evidence for wall thickening in the small bowel underlying the midline wound, there does appear to be contrast within the wound raising concern for enterocutaneous fistula.  Imaging through the pelvis shows no free intraperitoneal fluid. There is no pelvic sidewall lymphadenopathy.  Bladder is unremarkable.  Uterus is surgically absent.  There is no adnexal mass.  No substantial diverticular change in the colon.  No evidence for colonic diverticulitis.  Terminal ileum is unremarkable.  The appendix is normal  Bone windows reveal no worrisome lytic or sclerotic osseous lesions.  IMPRESSION: Anterior abdominal wall abscess seen on the previous study has resolved.  There is an open midline superficial wound at this time. There does appear to be some contrast material and the wound, raising concern for enterocutaneous fistula although no underlying abnormal small bowel loops are evident.  Exam is otherwise stable.   Original Report  Authenticated By: ERIC A. MANSELL, M.D.     Anti-infectives: Anti-infectives    None       Assessment/Plan  1.  Enterocutaneous fistula: CT reviewed and EC fistula present, keep patient NPO and on TNA, will take 6-12 weeks for this to heal and will need NPO status during that time.  No surgical intervention needed.  Will continue to follow and will arrange for outpatient follow up.    LOS: 9 days    Makenly Larabee 04/22/2012

## 2012-04-23 NOTE — Progress Notes (Signed)
Agree with note.

## 2012-04-26 LAB — CBC WITH DIFFERENTIAL/PLATELET
Basophils Absolute: 0 10*3/uL (ref 0.0–0.1)
Basophils Relative: 1 % (ref 0–1)
Hemoglobin: 11.2 g/dL — ABNORMAL LOW (ref 12.0–15.0)
MCHC: 32.7 g/dL (ref 30.0–36.0)
Neutro Abs: 2.4 10*3/uL (ref 1.7–7.7)
Neutrophils Relative %: 47 % (ref 43–77)
RDW: 16.4 % — ABNORMAL HIGH (ref 11.5–15.5)

## 2012-04-26 LAB — BASIC METABOLIC PANEL
BUN: 36 mg/dL — ABNORMAL HIGH (ref 6–23)
Creatinine, Ser: 0.94 mg/dL (ref 0.50–1.10)
GFR calc Af Amer: 74 mL/min — ABNORMAL LOW (ref >90)
GFR calc non Af Amer: 64 mL/min — ABNORMAL LOW (ref >90)
Potassium: 5.1 mEq/L (ref 3.5–5.1)

## 2012-04-28 NOTE — Progress Notes (Signed)
Patient ID: Rubbie Goostree, female   DOB: 12-12-1950, 61 y.o.   MRN: [id number]    Subjective: Pt wo complaints, denies n/v, abd pain. No changes  Objective:    PE: Abd: soft, nontender, eakins now discontinued and has dressing in place with min drainage from MOnday(date on the dressing), small draining wound at base of incision,   Lab Results:   Basename 04/26/12 0525  WBC 5.0  HGB 11.2*  HCT 34.3*  PLT 164   BMET  Basename 04/26/12 0525  NA 137  K 5.1  CL 103  CO2 24  GLUCOSE 112*  BUN 36*  CREATININE 0.94  CALCIUM 9.6   PT/INR No results found for this basename: LABPROT:2,INR:2 in the last 72 hours CMP     Component Value Date/Time   NA 137 04/26/2012 0525   K 5.1 04/26/2012 0525   CL 103 04/26/2012 0525   CO2 24 04/26/2012 0525   GLUCOSE 112* 04/26/2012 0525   BUN 36* 04/26/2012 0525   CREATININE 0.94 04/26/2012 0525   CALCIUM 9.6 04/26/2012 0525   PROT 6.4 04/21/2012 0522   ALBUMIN 2.6* 04/21/2012 0522   AST 16 04/21/2012 0522   ALT 15 04/21/2012 0522   ALKPHOS 92 04/21/2012 0522   BILITOT 0.2* 04/21/2012 0522   GFRNONAA 64* 04/26/2012 0525   GFRAA 74* 04/26/2012 0525   Lipase     Component Value Date/Time   LIPASE 9* 04/07/2012 1000       Studies/Results: No results found.  Anti-infectives: Anti-infectives    None       Assessment/Plan  1.  Enterocutaneous fistula: drainage decreasing now and Eakins discontinued,  keep patient NPO and on TNA, will take 6-12 weeks for this to heal and will need NPO status during that time.  No surgical intervention needed.  Will continue to follow and will arrange for outpatient follow up.    LOS: 15 days    WHITE, ELIZABETH 04/28/2012

## 2012-05-02 LAB — COMPREHENSIVE METABOLIC PANEL
ALT: 11 U/L (ref 0–35)
AST: 12 U/L (ref 0–37)
CO2: 27 mEq/L (ref 19–32)
Chloride: 105 mEq/L (ref 96–112)
GFR calc non Af Amer: 52 mL/min — ABNORMAL LOW (ref >90)
Potassium: 5.4 mEq/L — ABNORMAL HIGH (ref 3.5–5.1)
Sodium: 139 mEq/L (ref 135–145)
Total Bilirubin: 0.3 mg/dL (ref 0.3–1.2)

## 2012-05-02 LAB — CBC
Platelets: 156 10*3/uL (ref 150–400)
RBC: 3.41 MIL/uL — ABNORMAL LOW (ref 3.87–5.11)
WBC: 5.9 10*3/uL (ref 4.0–10.5)

## 2012-05-03 LAB — BASIC METABOLIC PANEL
BUN: 36 mg/dL — ABNORMAL HIGH (ref 6–23)
CO2: 27 mEq/L (ref 19–32)
Chloride: 102 mEq/L (ref 96–112)
Glucose, Bld: 101 mg/dL — ABNORMAL HIGH (ref 70–99)
Potassium: 4.8 mEq/L (ref 3.5–5.1)
Sodium: 137 mEq/L (ref 135–145)

## 2012-05-03 NOTE — Progress Notes (Signed)
Patient ID: Gina [name], female   DOB: 25-Dec-1950, 61 y.o.   MRN: [id number] Patient ID: Gina [name], female   DOB: Nov 19, 1950, 61 y.o.   MRN: [id number]    Subjective: Pt wo complaints, denies n/v, abd pain. No changes, hardly any drainage from wound now  Objective:    PE: Abd: soft, nontender, very little drainage at all now and appears more sang.   Lab Results:   [facility] 05/02/12 0555  WBC 5.9  HGB 10.6*  HCT 31.4*  PLT 156   BMET  Basename 05/03/12 0530 05/02/12 1210 05/02/12 0555  NA 137 -- 139  K 4.8 4.7 --  CL 102 -- 105  CO2 27 -- 27  GLUCOSE 101* -- 68*  BUN 36* -- 38*  CREATININE 1.16* -- 1.11*  CALCIUM 9.8 -- 9.6   PT/INR No results found for this basename: LABPROT:2,INR:2 in the last 72 hours CMP     Component Value Date/Time   NA 137 05/03/2012 0530   K 4.8 05/03/2012 0530   CL 102 05/03/2012 0530   CO2 27 05/03/2012 0530   GLUCOSE 101* 05/03/2012 0530   BUN 36* 05/03/2012 0530   CREATININE 1.16* 05/03/2012 0530   CALCIUM 9.8 05/03/2012 0530   PROT 6.4 05/02/2012 0555   ALBUMIN 2.8* 05/02/2012 0555   AST 12 05/02/2012 0555   ALT 11 05/02/2012 0555   ALKPHOS 94 05/02/2012 0555   BILITOT 0.3 05/02/2012 0555   GFRNONAA 50* 05/03/2012 0530   GFRAA 58* 05/03/2012 0530   Lipase     Component Value Date/Time   LIPASE 9* 04/07/2012 1000       Studies/Results: No results found.  Anti-infectives: Anti-infectives    None       Assessment/Plan  1.  Enterocutaneous fistula: drainage seems to be almost stopped now, will order fistulagram of the area to see if there is any patency.  If not then there patient can be started on a trial of clears with close monitoring of the area..    LOS: 20 days    WHITE, ELIZABETH 05/03/2012

## 2012-05-03 NOTE — Progress Notes (Signed)
General surgery attending note:  I agree with the assessment and plan outlined by Eulogio Bear, PA. Will await fistulogram.   Edsel Petrin. Dalbert Batman, M.D., [facility] General and Minimally invasive Surgery Breast and Colorectal Surgery Office:   [phone] Pager:   [phone]

## 2012-05-04 ENCOUNTER — Other Ambulatory Visit (HOSPITAL_COMMUNITY): Payer: Self-pay

## 2012-05-04 DIAGNOSIS — Z1389 Encounter for screening for other disorder: Secondary | ICD-10-CM | POA: Diagnosis not present

## 2012-05-05 LAB — BASIC METABOLIC PANEL
BUN: 40 mg/dL — ABNORMAL HIGH (ref 6–23)
CO2: 26 mEq/L (ref 19–32)
Chloride: 105 mEq/L (ref 96–112)
Creatinine, Ser: 1.11 mg/dL — ABNORMAL HIGH (ref 0.50–1.10)
GFR calc Af Amer: 61 mL/min — ABNORMAL LOW (ref >90)
Glucose, Bld: 118 mg/dL — ABNORMAL HIGH (ref 70–99)
Potassium: 4.9 mEq/L (ref 3.5–5.1)

## 2012-05-07 NOTE — Progress Notes (Cosign Needed)
Patient ID: Gina [name], female   DOB: 1950-08-19, 61 y.o.   MRN: [id number]    Subjective: Pt wo complaints, denies n/v, abd pain. Tolerating clear liquid diet and no increase in drainage.  Planning to go home on Monday  Objective:    PE: Abd: soft, nontender, no drainage from wounds.   Lab Results:  No results found for this basename: WBC:2,HGB:2,HCT:2,PLT:2 in the last 72 hours BMET  [facility] 05/05/12 0457  NA 141  K 4.9  CL 105  CO2 26  GLUCOSE 118*  BUN 40*  CREATININE 1.11*  CALCIUM 9.9   PT/INR No results found for this basename: LABPROT:2,INR:2 in the last 72 hours CMP     Component Value Date/Time   NA 141 05/05/2012 0457   K 4.9 05/05/2012 0457   CL 105 05/05/2012 0457   CO2 26 05/05/2012 0457   GLUCOSE 118* 05/05/2012 0457   BUN 40* 05/05/2012 0457   CREATININE 1.11* 05/05/2012 0457   CALCIUM 9.9 05/05/2012 0457   PROT 6.4 05/02/2012 0555   ALBUMIN 2.8* 05/02/2012 0555   AST 12 05/02/2012 0555   ALT 11 05/02/2012 0555   ALKPHOS 94 05/02/2012 0555   BILITOT 0.3 05/02/2012 0555   GFRNONAA 52* 05/05/2012 0457   GFRAA 61* 05/05/2012 0457   Lipase     Component Value Date/Time   LIPASE 9* 04/07/2012 1000       Studies/Results: No results found.  Anti-infectives: Anti-infectives    None       Assessment/Plan  1.  Enterocutaneous fistula: fistula seems closed and no drainage now even with diet, will advance to full liquids, likely home Monday per patient, will need follow up with [place] in 3-4 weeks after discharge, advance diet as tolerated.   LOS: 24 days    Urvi Imes 05/07/2012

## 2012-05-10 LAB — CBC WITH DIFFERENTIAL/PLATELET
Eosinophils Absolute: 0.3 10*3/uL (ref 0.0–0.7)
HCT: 30.4 % — ABNORMAL LOW (ref 36.0–46.0)
Hemoglobin: 10.1 g/dL — ABNORMAL LOW (ref 12.0–15.0)
Lymphs Abs: 2.3 10*3/uL (ref 0.7–4.0)
MCH: 31.2 pg (ref 26.0–34.0)
MCV: 93.8 fL (ref 78.0–100.0)
Monocytes Absolute: 0.5 10*3/uL (ref 0.1–1.0)
Monocytes Relative: 8 % (ref 3–12)
Neutrophils Relative %: 54 % (ref 43–77)
RBC: 3.24 MIL/uL — ABNORMAL LOW (ref 3.87–5.11)

## 2012-05-10 LAB — RENAL FUNCTION PANEL
BUN: 27 mg/dL — ABNORMAL HIGH (ref 6–23)
Creatinine, Ser: 1.28 mg/dL — ABNORMAL HIGH (ref 0.50–1.10)
Glucose, Bld: 89 mg/dL (ref 70–99)
Phosphorus: 4.1 mg/dL (ref 2.3–4.6)
Potassium: 5 mEq/L (ref 3.5–5.1)

## 2012-05-13 LAB — BASIC METABOLIC PANEL
BUN: 22 mg/dL (ref 6–23)
CO2: 28 mEq/L (ref 19–32)
Calcium: 9.3 mg/dL (ref 8.4–10.5)
Glucose, Bld: 83 mg/dL (ref 70–99)
Potassium: 5 mEq/L (ref 3.5–5.1)
Sodium: 140 mEq/L (ref 135–145)

## 2012-05-15 DIAGNOSIS — G4730 Sleep apnea, unspecified: Secondary | ICD-10-CM | POA: Diagnosis not present

## 2012-05-15 DIAGNOSIS — S31109A Unspecified open wound of abdominal wall, unspecified quadrant without penetration into peritoneal cavity, initial encounter: Secondary | ICD-10-CM | POA: Diagnosis not present

## 2012-05-15 DIAGNOSIS — M6281 Muscle weakness (generalized): Secondary | ICD-10-CM | POA: Diagnosis not present

## 2012-05-15 DIAGNOSIS — F319 Bipolar disorder, unspecified: Secondary | ICD-10-CM | POA: Diagnosis not present

## 2012-05-15 DIAGNOSIS — G4733 Obstructive sleep apnea (adult) (pediatric): Secondary | ICD-10-CM | POA: Diagnosis not present

## 2012-05-15 DIAGNOSIS — F3162 Bipolar disorder, current episode mixed, moderate: Secondary | ICD-10-CM | POA: Diagnosis not present

## 2012-05-15 DIAGNOSIS — B958 Unspecified staphylococcus as the cause of diseases classified elsewhere: Secondary | ICD-10-CM | POA: Diagnosis not present

## 2012-05-15 DIAGNOSIS — R5383 Other fatigue: Secondary | ICD-10-CM | POA: Diagnosis not present

## 2012-05-15 DIAGNOSIS — K4190 Unilateral femoral hernia, without obstruction or gangrene, not specified as recurrent: Secondary | ICD-10-CM | POA: Diagnosis not present

## 2012-05-15 DIAGNOSIS — E46 Unspecified protein-calorie malnutrition: Secondary | ICD-10-CM | POA: Diagnosis not present

## 2012-05-15 DIAGNOSIS — T8140XA Infection following a procedure, unspecified, initial encounter: Secondary | ICD-10-CM | POA: Diagnosis not present

## 2012-05-17 DIAGNOSIS — F319 Bipolar disorder, unspecified: Secondary | ICD-10-CM | POA: Diagnosis not present

## 2012-05-17 DIAGNOSIS — G4733 Obstructive sleep apnea (adult) (pediatric): Secondary | ICD-10-CM | POA: Diagnosis not present

## 2012-05-17 DIAGNOSIS — T8140XA Infection following a procedure, unspecified, initial encounter: Secondary | ICD-10-CM | POA: Diagnosis not present

## 2012-05-19 DIAGNOSIS — I129 Hypertensive chronic kidney disease with stage 1 through stage 4 chronic kidney disease, or unspecified chronic kidney disease: Secondary | ICD-10-CM | POA: Diagnosis not present

## 2012-05-19 DIAGNOSIS — T8183XA Persistent postprocedural fistula, initial encounter: Secondary | ICD-10-CM | POA: Diagnosis not present

## 2012-05-19 DIAGNOSIS — Z85038 Personal history of other malignant neoplasm of large intestine: Secondary | ICD-10-CM | POA: Diagnosis not present

## 2012-05-19 DIAGNOSIS — I498 Other specified cardiac arrhythmias: Secondary | ICD-10-CM | POA: Diagnosis not present

## 2012-05-19 DIAGNOSIS — N189 Chronic kidney disease, unspecified: Secondary | ICD-10-CM | POA: Diagnosis not present

## 2012-05-20 ENCOUNTER — Telehealth: Payer: Self-pay | Admitting: *Deleted

## 2012-05-20 NOTE — Telephone Encounter (Signed)
FYI, PT eval was done today and a return visit is usually done in 48 hours, but pt's brother and sister are refusing to have it done then due to family schedule conflict. Return visit will be done on Monday 05/24/12.

## 2012-05-20 NOTE — Telephone Encounter (Signed)
Noted  

## 2012-05-23 DIAGNOSIS — T8183XA Persistent postprocedural fistula, initial encounter: Secondary | ICD-10-CM | POA: Diagnosis not present

## 2012-05-23 DIAGNOSIS — I498 Other specified cardiac arrhythmias: Secondary | ICD-10-CM | POA: Diagnosis not present

## 2012-05-23 DIAGNOSIS — Z85038 Personal history of other malignant neoplasm of large intestine: Secondary | ICD-10-CM | POA: Diagnosis not present

## 2012-05-23 DIAGNOSIS — I129 Hypertensive chronic kidney disease with stage 1 through stage 4 chronic kidney disease, or unspecified chronic kidney disease: Secondary | ICD-10-CM | POA: Diagnosis not present

## 2012-05-23 DIAGNOSIS — N189 Chronic kidney disease, unspecified: Secondary | ICD-10-CM | POA: Diagnosis not present

## 2012-05-24 ENCOUNTER — Telehealth: Payer: Self-pay

## 2012-05-24 DIAGNOSIS — I498 Other specified cardiac arrhythmias: Secondary | ICD-10-CM | POA: Diagnosis not present

## 2012-05-24 DIAGNOSIS — T8183XA Persistent postprocedural fistula, initial encounter: Secondary | ICD-10-CM | POA: Diagnosis not present

## 2012-05-24 DIAGNOSIS — I129 Hypertensive chronic kidney disease with stage 1 through stage 4 chronic kidney disease, or unspecified chronic kidney disease: Secondary | ICD-10-CM | POA: Diagnosis not present

## 2012-05-24 DIAGNOSIS — Z85038 Personal history of other malignant neoplasm of large intestine: Secondary | ICD-10-CM | POA: Diagnosis not present

## 2012-05-24 DIAGNOSIS — N189 Chronic kidney disease, unspecified: Secondary | ICD-10-CM | POA: Diagnosis not present

## 2012-05-24 NOTE — Telephone Encounter (Signed)
Gina [name] PT with [facility] request orders for PT 2 x a week for 3 weeks for extremity strenghtening and balance.Please advise.

## 2012-05-25 DIAGNOSIS — I498 Other specified cardiac arrhythmias: Secondary | ICD-10-CM | POA: Diagnosis not present

## 2012-05-25 DIAGNOSIS — I129 Hypertensive chronic kidney disease with stage 1 through stage 4 chronic kidney disease, or unspecified chronic kidney disease: Secondary | ICD-10-CM | POA: Diagnosis not present

## 2012-05-25 DIAGNOSIS — N189 Chronic kidney disease, unspecified: Secondary | ICD-10-CM | POA: Diagnosis not present

## 2012-05-25 DIAGNOSIS — T8183XA Persistent postprocedural fistula, initial encounter: Secondary | ICD-10-CM | POA: Diagnosis not present

## 2012-05-25 DIAGNOSIS — Z85038 Personal history of other malignant neoplasm of large intestine: Secondary | ICD-10-CM | POA: Diagnosis not present

## 2012-05-25 NOTE — Telephone Encounter (Signed)
She will be establishing with me on Thursday - ok to do.

## 2012-05-25 NOTE — Telephone Encounter (Signed)
Message left notifying Gina [name] at [facility] that PT was fine per Dr. Reece Agar.

## 2012-05-27 ENCOUNTER — Telehealth: Payer: Self-pay | Admitting: Family Medicine

## 2012-05-27 ENCOUNTER — Ambulatory Visit (INDEPENDENT_AMBULATORY_CARE_PROVIDER_SITE_OTHER): Payer: Medicare Other | Admitting: Family Medicine

## 2012-05-27 ENCOUNTER — Encounter: Payer: Self-pay | Admitting: Family Medicine

## 2012-05-27 VITALS — BP 136/98 | HR 104 | Temp 99.8°F | Ht 62.0 in | Wt 177.0 lb

## 2012-05-27 DIAGNOSIS — N9489 Other specified conditions associated with female genital organs and menstrual cycle: Secondary | ICD-10-CM

## 2012-05-27 DIAGNOSIS — F319 Bipolar disorder, unspecified: Secondary | ICD-10-CM | POA: Diagnosis not present

## 2012-05-27 DIAGNOSIS — K632 Fistula of intestine: Secondary | ICD-10-CM | POA: Diagnosis not present

## 2012-05-27 DIAGNOSIS — E669 Obesity, unspecified: Secondary | ICD-10-CM

## 2012-05-27 DIAGNOSIS — N189 Chronic kidney disease, unspecified: Secondary | ICD-10-CM

## 2012-05-27 DIAGNOSIS — N184 Chronic kidney disease, stage 4 (severe): Secondary | ICD-10-CM | POA: Insufficient documentation

## 2012-05-27 DIAGNOSIS — I10 Essential (primary) hypertension: Secondary | ICD-10-CM | POA: Diagnosis not present

## 2012-05-27 DIAGNOSIS — L538 Other specified erythematous conditions: Secondary | ICD-10-CM | POA: Diagnosis not present

## 2012-05-27 DIAGNOSIS — L304 Erythema intertrigo: Secondary | ICD-10-CM

## 2012-05-27 DIAGNOSIS — G4730 Sleep apnea, unspecified: Secondary | ICD-10-CM

## 2012-05-27 DIAGNOSIS — C189 Malignant neoplasm of colon, unspecified: Secondary | ICD-10-CM

## 2012-05-27 MED ORDER — FLUOXETINE HCL 40 MG PO CAPS: 40.0000 mg | ORAL_CAPSULE | Freq: Every day | ORAL | Status: DC

## 2012-05-27 MED ORDER — NYSTATIN 100000 UNIT/GM EX CREA: TOPICAL_CREAM | Freq: Two times a day (BID) | CUTANEOUS | Status: DC

## 2012-05-27 MED ORDER — CHLORPROMAZINE HCL 50 MG PO TABS: 50.0000 mg | ORAL_TABLET | Freq: Two times a day (BID) | ORAL | Status: DC

## 2012-05-27 MED ORDER — PANTOPRAZOLE SODIUM 40 MG PO TBEC: 40.0000 mg | DELAYED_RELEASE_TABLET | Freq: Two times a day (BID) | ORAL | Status: DC

## 2012-05-27 MED ORDER — BENZTROPINE MESYLATE 1 MG PO TABS: 1.0000 mg | ORAL_TABLET | Freq: Two times a day (BID) | ORAL | Status: DC

## 2012-05-27 MED ORDER — OMEPRAZOLE 40 MG PO CPDR: 40.0000 mg | DELAYED_RELEASE_CAPSULE | Freq: Every day | ORAL | Status: DC

## 2012-05-27 MED ORDER — VALPROIC ACID 250 MG PO CAPS: 250.0000 mg | ORAL_CAPSULE | Freq: Two times a day (BID) | ORAL | Status: DC

## 2012-05-27 NOTE — Assessment & Plan Note (Signed)
Anticipate candidal intertrigo - treat with nystatin.  Bleeding could be from skin irritation.  If not resolved with treatment, return for further evaluation, pelvic exam.

## 2012-05-27 NOTE — Assessment & Plan Note (Signed)
Unsure if truly has this diagnosis - states anxiety from coming into new doctor's office led to increased diastolic today. States off metoprolol for several months. Continue to monitor for now, await records from prior PCP.

## 2012-05-27 NOTE — Assessment & Plan Note (Signed)
S/p colon resection 2008?  Await records.  Denies any recent colonoscopy

## 2012-05-27 NOTE — Telephone Encounter (Signed)
Patient notified and will await call from [facility]. [facility]ul[facility] and they need a substitution. Please advise. [facility] [address] [facility] [place]. Other meds forwarded to that [facility] with the exception of protonix.

## 2012-05-27 NOTE — Assessment & Plan Note (Signed)
Discussed she will need to establish with local psychiatrist to follow her bipolar disorder. On psychotropic regimen that I do not feel comfortable continuing to prescribe.  Will refill today for several months while pt gets in to see psych. Pt agrees with plan.

## 2012-05-27 NOTE — Progress Notes (Signed)
Subjective:    Patient ID: Gina [name], female    DOB: April 27, 1951, 61 y.o.   MRN: [id number]  HPI CC: new pt  Gina [name] present to establish care today.  Moved from [place] (prior PCP was Dr. Kevan Rosebush [phone]).  HTN - states taken off metoprolol XL HLD - states taken off lovastatin. Mild CRI - pt unaware. Mild anemia - pt unaware.  H/o bipolar 1 - dx age 30 yo.  Would like to establish with local psychiatrist.  When takes medicine does well.  Recent complicated hospital course for enterocutaneous fistula.  Prior complicated hospitalization 02/2012 in [place] for ventral hernia repair wound dehiscence s/p I&D, wound vac placement, re exploration and allograft placement with wound infection growing VRE and MRSA.  Sent to SNF for rehab, treated with IV flagyl and daptomycin.  Sees Dr. Andrey Campanile 06/11/2012, was unaware of this appt.    worried about groin rash - HH RN QOD, dressing changes, concerned with groin yeast infection.    She is s/p hysterectomy and LSO, unclear reason as to hysterectomy but pt states there may have been some concern for cancer. During hospitalization, there was concern for R sided adnexal mass on initially CT scan 04/07/2012, but on repeat CT scan 04/21/2012, no adnexal mass was found.   Lives with sister, no pets Occupation: disabled, for bipolar and arthritis Edu: GED Activity: take walks Diet: good water, vegetables daily  Preventative: States last CPE was a few months ago. States h/o hysterectomy for ?cancer - pap last year 2012 normal. H/o colon cancer s/p colectomy 2008.  No recent colonoscopy. Flu shot in hospital per pt   BP Readings from Last 3 Encounters:  05/27/12 136/98  04/13/12 102/65     Medications and allergies reviewed and updated in chart.  Past histories reviewed and updated if relevant as below. Patient Active Problem List  Diagnosis  . Colon cancer  . Enterocutaneous fistula  . Sleep apnea  . HTN  (hypertension), benign  . Bipolar 1 disorder   Past Medical History  Diagnosis Date  . Hypertension   . Bipolar 1 disorder   . Depression   . CRI (chronic renal insufficiency)   . Obesity (BMI 30-39.9)   . Colon cancer 04/07/2012  . 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 Sleep apnea 04/07/2012   Past Surgical History  Procedure Date  . Hernia repair 02/04/12  . Partial colectomy about 2008    for colon cancer  . I & d abdominal wound 02/19/12  . Removal of infected mesh and abdominal wound vac placement 02/24/12  . Reexploration of abdominal wound and allograft placemet 02/26/12  . Hysterectomy and left oopherectomy     date unknown   History  Substance Use Topics  . Smoking status: Never Smoker   . Smokeless tobacco: Never Used  . Alcohol Use: No   No family history on file. Allergies  Allergen Reactions  . Penicillins   . Tetanus Toxoids   . Ivp Dye (Iodinated Diagnostic Agents) Rash   Current Outpatient Prescriptions on File Prior to Visit  Medication Sig Dispense Refill  . benztropine (COGENTIN) 1 MG tablet Take 1 mg by mouth 2 (two) times daily.      . chlorproMAZINE (THORAZINE) 50 MG tablet Take 50 mg by mouth 2 (two) times daily.      . divalproex (DEPAKOTE) 125 MG DR tablet Take 125 mg by mouth 2 (two) times daily.      Marland Kitchen FLUoxetine (PROZAC) 40 MG capsule Take 40  mg by mouth daily.      . pantoprazole (PROTONIX) 40 MG tablet Take 40 mg by mouth 2 (two) times daily.      Marland Kitchen valproic acid (DEPAKENE) 250 MG capsule Take by mouth 2 (two) times daily.      . calcium carbonate (OS-CAL) 600 MG TABS Take 600 mg by mouth 2 (two) times daily with a meal.      . cholecalciferol (VITAMIN D) 400 UNITS TABS Take 400 Units by mouth daily.      Marland Kitchen lovastatin (MEVACOR) 40 MG tablet Take 40 mg by mouth at bedtime.      . metoprolol succinate (TOPROL-XL) 25 MG 24 hr tablet Take 25 mg by mouth daily.         Review of Systems  Constitutional: Negative for fever, chills, activity change,  appetite change, fatigue and unexpected weight change.  HENT: Negative for hearing loss and neck pain.   Eyes: Negative for visual disturbance.  Respiratory: Positive for cough. Negative for chest tightness, shortness of breath and wheezing.   Cardiovascular: Negative for chest pain, palpitations and leg swelling.  Gastrointestinal: Negative for nausea, vomiting, abdominal pain, diarrhea, constipation, blood in stool and abdominal distention.  Genitourinary: Positive for vaginal bleeding. Negative for hematuria and difficulty urinating.  Musculoskeletal: Negative for myalgias and arthralgias.  Skin: Positive for rash.  Neurological: Negative for dizziness, seizures, syncope and headaches.  Hematological: Does not bruise/bleed easily.  Psychiatric/Behavioral: Positive for dysphoric mood. Negative for confusion and agitation. The patient is not nervous/anxious.        Objective:   Physical Exam  Nursing note and vitals reviewed. Constitutional: She is oriented to person, place, and time. She appears well-developed and well-nourished. No distress.  HENT:  Head: Normocephalic and atraumatic.  Right Ear: External ear normal.  Left Ear: External ear normal.  Nose: Nose normal.  Mouth/Throat: Oropharynx is clear and moist. No oropharyngeal exudate.  Eyes: Conjunctivae normal and EOM are normal. Pupils are equal, round, and reactive to light. No scleral icterus.  Neck: Normal range of motion. Neck supple.  Cardiovascular: Normal rate, regular rhythm, normal heart sounds and intact distal pulses.   No murmur heard. Pulses:      Radial pulses are 2+ on the right side, and 2+ on the left side.  Pulmonary/Chest: Effort normal and breath sounds normal. No respiratory distress. She has no wheezes. She has no rales.  Abdominal: Soft. Bowel sounds are normal. She exhibits no distension and no mass. There is no tenderness. There is no rebound and no guarding.  Genitourinary:       No obvious bleed  with precursory vaginal exam.   Did not use speculum.  Musculoskeletal: Normal range of motion. She exhibits no edema.  Lymphadenopathy:    She has no cervical adenopathy.  Neurological: She is alert and oriented to person, place, and time.       CN grossly intact, station and gait intact  Skin: Skin is warm and dry. Rash noted.          Erythematous patchy rash bilateral groin folds, pruritic  Psychiatric: She has a normal mood and affect. Her speech is normal and behavior is normal. Thought content normal.       Somewhat disheveled but overall stable       Assessment & Plan:

## 2012-05-27 NOTE — Telephone Encounter (Signed)
plz notify that in reviewing records from recent hospitalization, i did find that initial CT scan did show mass near right ovary, however on rpt CT scan no mass seen.  I would like to get pelvic ultrasound to make sure no mass remains.  Have placed order in chart.

## 2012-05-27 NOTE — Telephone Encounter (Signed)
Noted. Thanks.

## 2012-05-27 NOTE — Assessment & Plan Note (Signed)
Seems to be closing well. Encouraged keeping appt with surgery 06/11/2012, provided with directions and # for [facility] office.

## 2012-05-27 NOTE — Patient Instructions (Addendum)
You have appointment with Dr. Andrey Campanile Surgeon on 06/11/2012 at 11:45am with [facility]. [address]  [place], [place] [address] [phone]  I've refilled your medicines today - [facility]. Sign release form for records from prior PCP - Dr. Karel Jarvis.   Pass by [name]'s office to schedule psychiatry referral. For groin rash - you do have yeast infection.  Treat with nystatin cream around groin.  Treat for 3-4 weeks straight twice daily.  This cream has been sent to your pharmacy.  If not improving, let me know. Return for a physical in 3 months.

## 2012-05-27 NOTE — Telephone Encounter (Signed)
Will start with omeprazole 40mg  daily.  Changed in list.

## 2012-05-27 NOTE — Assessment & Plan Note (Addendum)
Concern for partially calcified 2.7cm R adnexal mass on CT abd/pelvis during recent hospitalization, however on repeat CT, no adnexal mass found. Patient is s/p hysterectomy with LSO. Will order TVS to fully evaluate right adnexa.

## 2012-05-27 NOTE — Assessment & Plan Note (Signed)
?  dx  Off CPAP.  Await records.

## 2012-05-27 NOTE — Assessment & Plan Note (Signed)
Mild on latest blood work.  Pt was unaware of this dx.  Again, await records from Dr. Karel Jarvis, prior PCP.

## 2012-05-27 NOTE — Telephone Encounter (Signed)
Jan PT with [facility] wanted to notify Dr Reece Agar pt missed PT appt today. Jan left message for pt to call and schedule a time next week for PT visit.  Jan does not require call back.

## 2012-05-28 ENCOUNTER — Telehealth: Payer: Self-pay

## 2012-05-28 DIAGNOSIS — Z85038 Personal history of other malignant neoplasm of large intestine: Secondary | ICD-10-CM | POA: Diagnosis not present

## 2012-05-28 DIAGNOSIS — I498 Other specified cardiac arrhythmias: Secondary | ICD-10-CM | POA: Diagnosis not present

## 2012-05-28 DIAGNOSIS — N189 Chronic kidney disease, unspecified: Secondary | ICD-10-CM | POA: Diagnosis not present

## 2012-05-28 DIAGNOSIS — I129 Hypertensive chronic kidney disease with stage 1 through stage 4 chronic kidney disease, or unspecified chronic kidney disease: Secondary | ICD-10-CM | POA: Diagnosis not present

## 2012-05-28 DIAGNOSIS — T8183XA Persistent postprocedural fistula, initial encounter: Secondary | ICD-10-CM | POA: Diagnosis not present

## 2012-05-28 NOTE — Telephone Encounter (Signed)
Message left notifying Gina [name]. Advised to call with any questions.

## 2012-05-28 NOTE — Telephone Encounter (Signed)
Agree 

## 2012-05-28 NOTE — Telephone Encounter (Signed)
Gina [name] psychotherapist with [facility] request verbal order to access needs for [employer]. When nurse did eval she found out that pts brother has returned to his home country and pt is without medical transportation.Please advise.

## 2012-05-30 DIAGNOSIS — Z85038 Personal history of other malignant neoplasm of large intestine: Secondary | ICD-10-CM | POA: Diagnosis not present

## 2012-05-30 DIAGNOSIS — N189 Chronic kidney disease, unspecified: Secondary | ICD-10-CM | POA: Diagnosis not present

## 2012-05-30 DIAGNOSIS — I498 Other specified cardiac arrhythmias: Secondary | ICD-10-CM | POA: Diagnosis not present

## 2012-05-30 DIAGNOSIS — I129 Hypertensive chronic kidney disease with stage 1 through stage 4 chronic kidney disease, or unspecified chronic kidney disease: Secondary | ICD-10-CM | POA: Diagnosis not present

## 2012-05-30 DIAGNOSIS — T8183XA Persistent postprocedural fistula, initial encounter: Secondary | ICD-10-CM | POA: Diagnosis not present

## 2012-05-31 DIAGNOSIS — I498 Other specified cardiac arrhythmias: Secondary | ICD-10-CM | POA: Diagnosis not present

## 2012-05-31 DIAGNOSIS — Z85038 Personal history of other malignant neoplasm of large intestine: Secondary | ICD-10-CM | POA: Diagnosis not present

## 2012-05-31 DIAGNOSIS — N189 Chronic kidney disease, unspecified: Secondary | ICD-10-CM | POA: Diagnosis not present

## 2012-05-31 DIAGNOSIS — I129 Hypertensive chronic kidney disease with stage 1 through stage 4 chronic kidney disease, or unspecified chronic kidney disease: Secondary | ICD-10-CM | POA: Diagnosis not present

## 2012-05-31 DIAGNOSIS — T8183XA Persistent postprocedural fistula, initial encounter: Secondary | ICD-10-CM | POA: Diagnosis not present

## 2012-06-01 DIAGNOSIS — I129 Hypertensive chronic kidney disease with stage 1 through stage 4 chronic kidney disease, or unspecified chronic kidney disease: Secondary | ICD-10-CM | POA: Diagnosis not present

## 2012-06-01 DIAGNOSIS — N189 Chronic kidney disease, unspecified: Secondary | ICD-10-CM | POA: Diagnosis not present

## 2012-06-01 DIAGNOSIS — T8183XA Persistent postprocedural fistula, initial encounter: Secondary | ICD-10-CM | POA: Diagnosis not present

## 2012-06-01 DIAGNOSIS — Z85038 Personal history of other malignant neoplasm of large intestine: Secondary | ICD-10-CM | POA: Diagnosis not present

## 2012-06-01 DIAGNOSIS — I498 Other specified cardiac arrhythmias: Secondary | ICD-10-CM | POA: Diagnosis not present

## 2012-06-02 ENCOUNTER — Telehealth: Payer: Self-pay

## 2012-06-02 ENCOUNTER — Telehealth: Payer: Self-pay | Admitting: *Deleted

## 2012-06-02 DIAGNOSIS — I498 Other specified cardiac arrhythmias: Secondary | ICD-10-CM | POA: Diagnosis not present

## 2012-06-02 DIAGNOSIS — I129 Hypertensive chronic kidney disease with stage 1 through stage 4 chronic kidney disease, or unspecified chronic kidney disease: Secondary | ICD-10-CM | POA: Diagnosis not present

## 2012-06-02 DIAGNOSIS — T8183XA Persistent postprocedural fistula, initial encounter: Secondary | ICD-10-CM | POA: Diagnosis not present

## 2012-06-02 DIAGNOSIS — Z79899 Other long term (current) drug therapy: Secondary | ICD-10-CM | POA: Diagnosis not present

## 2012-06-02 DIAGNOSIS — N189 Chronic kidney disease, unspecified: Secondary | ICD-10-CM | POA: Diagnosis not present

## 2012-06-02 DIAGNOSIS — Z85038 Personal history of other malignant neoplasm of large intestine: Secondary | ICD-10-CM | POA: Diagnosis not present

## 2012-06-02 DIAGNOSIS — F39 Unspecified mood [affective] disorder: Secondary | ICD-10-CM | POA: Diagnosis not present

## 2012-06-02 NOTE — Telephone Encounter (Signed)
Pt requested name,address and phone number of where she is to get [place]. Notified pt on phone [facility] [address], [place] [address], [phone]. Pt voiced understanding.

## 2012-06-02 NOTE — Telephone Encounter (Signed)
Pt request contact # for [facility] where having [place] and contact # Dr Gaynelle Adu.Info requested given to pt.

## 2012-06-02 NOTE — Telephone Encounter (Signed)
Geniece with [facility] called and asked for a 3 week extension on her visits with patient. She said she went out yesterday for her initial eval and came back with an extensive list of things that the patient is going to need assistance with including transportation,applying for Medicaid,getting her CPAP mask and machine serviced, medication assistance, legal aid, transferring banks, etc. Will this be okay?

## 2012-06-02 NOTE — Telephone Encounter (Signed)
That is fine.  Pt needs a lot of assistance and social support.

## 2012-06-02 NOTE — Telephone Encounter (Signed)
Geniece aware.

## 2012-06-03 ENCOUNTER — Encounter: Payer: Self-pay | Admitting: Family Medicine

## 2012-06-03 ENCOUNTER — Ambulatory Visit: Payer: Self-pay | Admitting: Family Medicine

## 2012-06-03 ENCOUNTER — Other Ambulatory Visit: Payer: Self-pay

## 2012-06-03 DIAGNOSIS — Z85038 Personal history of other malignant neoplasm of large intestine: Secondary | ICD-10-CM

## 2012-06-03 DIAGNOSIS — E279 Disorder of adrenal gland, unspecified: Secondary | ICD-10-CM | POA: Diagnosis not present

## 2012-06-03 DIAGNOSIS — I498 Other specified cardiac arrhythmias: Secondary | ICD-10-CM

## 2012-06-03 DIAGNOSIS — T8183XA Persistent postprocedural fistula, initial encounter: Secondary | ICD-10-CM

## 2012-06-03 DIAGNOSIS — N9489 Other specified conditions associated with female genital organs and menstrual cycle: Secondary | ICD-10-CM | POA: Diagnosis not present

## 2012-06-03 DIAGNOSIS — N189 Chronic kidney disease, unspecified: Secondary | ICD-10-CM

## 2012-06-03 DIAGNOSIS — I129 Hypertensive chronic kidney disease with stage 1 through stage 4 chronic kidney disease, or unspecified chronic kidney disease: Secondary | ICD-10-CM

## 2012-06-03 NOTE — Telephone Encounter (Signed)
Pt request refill fluoxetine. [facility] [address]

## 2012-06-04 NOTE — Telephone Encounter (Signed)
[facility]. Advised pt [facility] is [facility] and request transfer. Pt understood.

## 2012-06-05 DIAGNOSIS — T8183XA Persistent postprocedural fistula, initial encounter: Secondary | ICD-10-CM | POA: Diagnosis not present

## 2012-06-05 DIAGNOSIS — I498 Other specified cardiac arrhythmias: Secondary | ICD-10-CM | POA: Diagnosis not present

## 2012-06-05 DIAGNOSIS — N189 Chronic kidney disease, unspecified: Secondary | ICD-10-CM | POA: Diagnosis not present

## 2012-06-05 DIAGNOSIS — I129 Hypertensive chronic kidney disease with stage 1 through stage 4 chronic kidney disease, or unspecified chronic kidney disease: Secondary | ICD-10-CM | POA: Diagnosis not present

## 2012-06-05 DIAGNOSIS — Z85038 Personal history of other malignant neoplasm of large intestine: Secondary | ICD-10-CM | POA: Diagnosis not present

## 2012-06-07 DIAGNOSIS — Z85038 Personal history of other malignant neoplasm of large intestine: Secondary | ICD-10-CM | POA: Diagnosis not present

## 2012-06-07 DIAGNOSIS — I498 Other specified cardiac arrhythmias: Secondary | ICD-10-CM | POA: Diagnosis not present

## 2012-06-07 DIAGNOSIS — N189 Chronic kidney disease, unspecified: Secondary | ICD-10-CM | POA: Diagnosis not present

## 2012-06-07 DIAGNOSIS — I129 Hypertensive chronic kidney disease with stage 1 through stage 4 chronic kidney disease, or unspecified chronic kidney disease: Secondary | ICD-10-CM | POA: Diagnosis not present

## 2012-06-07 DIAGNOSIS — T8183XA Persistent postprocedural fistula, initial encounter: Secondary | ICD-10-CM | POA: Diagnosis not present

## 2012-06-08 DIAGNOSIS — I498 Other specified cardiac arrhythmias: Secondary | ICD-10-CM | POA: Diagnosis not present

## 2012-06-08 DIAGNOSIS — Z85038 Personal history of other malignant neoplasm of large intestine: Secondary | ICD-10-CM | POA: Diagnosis not present

## 2012-06-08 DIAGNOSIS — T8183XA Persistent postprocedural fistula, initial encounter: Secondary | ICD-10-CM | POA: Diagnosis not present

## 2012-06-08 DIAGNOSIS — I129 Hypertensive chronic kidney disease with stage 1 through stage 4 chronic kidney disease, or unspecified chronic kidney disease: Secondary | ICD-10-CM | POA: Diagnosis not present

## 2012-06-08 DIAGNOSIS — F39 Unspecified mood [affective] disorder: Secondary | ICD-10-CM | POA: Diagnosis not present

## 2012-06-08 DIAGNOSIS — N189 Chronic kidney disease, unspecified: Secondary | ICD-10-CM | POA: Diagnosis not present

## 2012-06-09 DIAGNOSIS — I498 Other specified cardiac arrhythmias: Secondary | ICD-10-CM | POA: Diagnosis not present

## 2012-06-09 DIAGNOSIS — N189 Chronic kidney disease, unspecified: Secondary | ICD-10-CM | POA: Diagnosis not present

## 2012-06-09 DIAGNOSIS — Z85038 Personal history of other malignant neoplasm of large intestine: Secondary | ICD-10-CM | POA: Diagnosis not present

## 2012-06-09 DIAGNOSIS — I129 Hypertensive chronic kidney disease with stage 1 through stage 4 chronic kidney disease, or unspecified chronic kidney disease: Secondary | ICD-10-CM | POA: Diagnosis not present

## 2012-06-09 DIAGNOSIS — T8183XA Persistent postprocedural fistula, initial encounter: Secondary | ICD-10-CM | POA: Diagnosis not present

## 2012-06-10 DIAGNOSIS — H2510 Age-related nuclear cataract, unspecified eye: Secondary | ICD-10-CM | POA: Diagnosis not present

## 2012-06-11 ENCOUNTER — Other Ambulatory Visit: Payer: Self-pay | Admitting: Family Medicine

## 2012-06-11 ENCOUNTER — Telehealth: Payer: Self-pay

## 2012-06-11 ENCOUNTER — Ambulatory Visit (INDEPENDENT_AMBULATORY_CARE_PROVIDER_SITE_OTHER): Payer: Medicare Other | Admitting: General Surgery

## 2012-06-11 ENCOUNTER — Encounter (INDEPENDENT_AMBULATORY_CARE_PROVIDER_SITE_OTHER): Payer: Self-pay | Admitting: General Surgery

## 2012-06-11 ENCOUNTER — Encounter (INDEPENDENT_AMBULATORY_CARE_PROVIDER_SITE_OTHER): Payer: Medicare Other | Admitting: General Surgery

## 2012-06-11 DIAGNOSIS — K632 Fistula of intestine: Secondary | ICD-10-CM

## 2012-06-11 NOTE — Progress Notes (Signed)
Subjective:     Patient ID: Gina [name], female   DOB: [birth year], 61 y.o.   MRN: [id number]  HPI 61 year old Caucasian female comes in for checkup. I have only seen the patient on one previous occasion and that was in the emergency department on September 13. The patient underwent a complex laparoscopic ventral hernia repair with extensive lysis of adhesions at an outside hospital in [place] during the late summer. She had numerous postoperative complications. She was taken back to the operating room on several occasions. She had her surgical mesh removed. She was had an episode of septic shock. She ultimately moved down to [place] and presented to the emergency room with an enterocutaneous fistula. [facility] and has been discharged home. She states that she is living independently in an apartment.she states that the nurse comes every other day to change the bandage. She reports minimal to no drainage from the wound. She denies any fever, chills, nausea, vomiting, diarrhea or constipation. She denies any abdominal pain.  Review of Systems     Objective:   Physical Exam Alert, nad Disheveled but clean abd -soft, nt, nd. Midline incision. Small skin defect about 1.5cm. No cellulitis, fluctuance, or induration. No drainage    Assessment:     Enterocutaneous fistula after a complex laparoscopic ventral hernia repair with extensive lysis of adhesions    Plan:     It appears her any cutaneous fistula is closing down. She has minimal drainage. I told the patient I would continue to follow her for now. I explained to the patient should her EC fistula reopened and have large amounts of drainage that she would need to seek medical care and definitive treatment at a tertiary academic [facility] or return to her primary surgeon in [place]. Followup 6-8 weeks  Leighton Ruff. [name], MD, FACS General, Bariatric, & Minimally  Invasive Surgery [facility], [place]

## 2012-06-11 NOTE — Telephone Encounter (Signed)
Jan PT with [facility] has discharged pt as of 06/09/12. If Dr Darnell Level has any questions call Jan back.

## 2012-06-11 NOTE — Patient Instructions (Signed)
Continue current wound care

## 2012-06-11 NOTE — Telephone Encounter (Signed)
Pt left v/m she was having difficulty contacting [facility] and requested help. Spoke with pt and she had spoken with pharmacy and did not need further assistance.

## 2012-06-13 NOTE — Telephone Encounter (Signed)
Noted. thakns.

## 2012-06-14 DIAGNOSIS — F39 Unspecified mood [affective] disorder: Secondary | ICD-10-CM | POA: Diagnosis not present

## 2012-06-15 DIAGNOSIS — T8183XA Persistent postprocedural fistula, initial encounter: Secondary | ICD-10-CM | POA: Diagnosis not present

## 2012-06-15 DIAGNOSIS — I129 Hypertensive chronic kidney disease with stage 1 through stage 4 chronic kidney disease, or unspecified chronic kidney disease: Secondary | ICD-10-CM | POA: Diagnosis not present

## 2012-06-15 DIAGNOSIS — I498 Other specified cardiac arrhythmias: Secondary | ICD-10-CM | POA: Diagnosis not present

## 2012-06-15 DIAGNOSIS — N189 Chronic kidney disease, unspecified: Secondary | ICD-10-CM | POA: Diagnosis not present

## 2012-06-15 DIAGNOSIS — Z85038 Personal history of other malignant neoplasm of large intestine: Secondary | ICD-10-CM | POA: Diagnosis not present

## 2012-06-17 ENCOUNTER — Other Ambulatory Visit: Payer: Self-pay | Admitting: *Deleted

## 2012-06-17 DIAGNOSIS — T8183XA Persistent postprocedural fistula, initial encounter: Secondary | ICD-10-CM | POA: Diagnosis not present

## 2012-06-17 DIAGNOSIS — N189 Chronic kidney disease, unspecified: Secondary | ICD-10-CM | POA: Diagnosis not present

## 2012-06-17 DIAGNOSIS — Z85038 Personal history of other malignant neoplasm of large intestine: Secondary | ICD-10-CM | POA: Diagnosis not present

## 2012-06-17 DIAGNOSIS — I498 Other specified cardiac arrhythmias: Secondary | ICD-10-CM | POA: Diagnosis not present

## 2012-06-17 DIAGNOSIS — I129 Hypertensive chronic kidney disease with stage 1 through stage 4 chronic kidney disease, or unspecified chronic kidney disease: Secondary | ICD-10-CM | POA: Diagnosis not present

## 2012-06-17 MED ORDER — LOVASTATIN 40 MG PO TABS: 40.0000 mg | ORAL_TABLET | Freq: Every day | ORAL | Status: DC

## 2012-06-21 ENCOUNTER — Telehealth: Payer: Self-pay

## 2012-06-21 DIAGNOSIS — T8183XA Persistent postprocedural fistula, initial encounter: Secondary | ICD-10-CM | POA: Diagnosis not present

## 2012-06-21 DIAGNOSIS — N189 Chronic kidney disease, unspecified: Secondary | ICD-10-CM | POA: Diagnosis not present

## 2012-06-21 DIAGNOSIS — I498 Other specified cardiac arrhythmias: Secondary | ICD-10-CM | POA: Diagnosis not present

## 2012-06-21 DIAGNOSIS — I129 Hypertensive chronic kidney disease with stage 1 through stage 4 chronic kidney disease, or unspecified chronic kidney disease: Secondary | ICD-10-CM | POA: Diagnosis not present

## 2012-06-21 DIAGNOSIS — Z85038 Personal history of other malignant neoplasm of large intestine: Secondary | ICD-10-CM | POA: Diagnosis not present

## 2012-06-21 NOTE — Telephone Encounter (Signed)
Ronald Lobo Child psychotherapist with [facility] had appt to see pt today; pt declined initial social workers visit.pt said she had all she needed at this time. Ronald Lobo will discharge pt.Please advise.

## 2012-06-22 NOTE — Telephone Encounter (Signed)
Noted.  I recommend social work eval but up to patient to decide.

## 2012-06-28 DIAGNOSIS — I498 Other specified cardiac arrhythmias: Secondary | ICD-10-CM | POA: Diagnosis not present

## 2012-06-28 DIAGNOSIS — N189 Chronic kidney disease, unspecified: Secondary | ICD-10-CM | POA: Diagnosis not present

## 2012-06-28 DIAGNOSIS — T8183XA Persistent postprocedural fistula, initial encounter: Secondary | ICD-10-CM | POA: Diagnosis not present

## 2012-06-28 DIAGNOSIS — Z85038 Personal history of other malignant neoplasm of large intestine: Secondary | ICD-10-CM | POA: Diagnosis not present

## 2012-06-28 DIAGNOSIS — I129 Hypertensive chronic kidney disease with stage 1 through stage 4 chronic kidney disease, or unspecified chronic kidney disease: Secondary | ICD-10-CM | POA: Diagnosis not present

## 2012-07-05 DIAGNOSIS — N189 Chronic kidney disease, unspecified: Secondary | ICD-10-CM | POA: Diagnosis not present

## 2012-07-05 DIAGNOSIS — T8183XA Persistent postprocedural fistula, initial encounter: Secondary | ICD-10-CM | POA: Diagnosis not present

## 2012-07-05 DIAGNOSIS — I498 Other specified cardiac arrhythmias: Secondary | ICD-10-CM | POA: Diagnosis not present

## 2012-07-05 DIAGNOSIS — Z85038 Personal history of other malignant neoplasm of large intestine: Secondary | ICD-10-CM | POA: Diagnosis not present

## 2012-07-05 DIAGNOSIS — I129 Hypertensive chronic kidney disease with stage 1 through stage 4 chronic kidney disease, or unspecified chronic kidney disease: Secondary | ICD-10-CM | POA: Diagnosis not present

## 2012-07-12 ENCOUNTER — Telehealth: Payer: Self-pay

## 2012-07-12 DIAGNOSIS — F39 Unspecified mood [affective] disorder: Secondary | ICD-10-CM | POA: Diagnosis not present

## 2012-07-12 NOTE — Telephone Encounter (Signed)
Pt left v/m requesting c pap supplies request faxed to [phone]. Left v/m for pt to call back to see what supplies pt needs.

## 2012-07-14 NOTE — Telephone Encounter (Signed)
Wrote out script and placed in [name]'s box.

## 2012-07-14 NOTE — Telephone Encounter (Signed)
Pt needs order for face mask, chamber and filters faxed to [phone].Please advise.

## 2012-07-14 NOTE — Telephone Encounter (Signed)
Order faxed to requested number

## 2012-07-22 ENCOUNTER — Telehealth: Payer: Self-pay

## 2012-07-22 NOTE — Telephone Encounter (Signed)
Pt request last sleep study faxed to [phone] to cpap supply co. Pt said had last sleep study 2 years ago in [place]. Pt spoke with the doctor's office in [place] and was told all records sent to Dr Danise Mina.

## 2012-07-27 NOTE — Telephone Encounter (Signed)
Called Dr Aquino's office and was advised by med records that her records were already sent a few months ago. I informed her that I did not show that we have received the records and we will need them resent, but we also need copy of last sleep study asap. Med rec will search for pt's chart (which may be off site since she transferred) and call our office back on Monday.

## 2012-07-27 NOTE — Telephone Encounter (Signed)
Pt called wanting the order for cpap supplies and copy of last cpap faxed to her. Did not see cpap report in chart, will contact her former provider for them.

## 2012-08-03 ENCOUNTER — Encounter: Payer: Self-pay | Admitting: Family Medicine

## 2012-08-05 ENCOUNTER — Encounter: Payer: Self-pay | Admitting: Family Medicine

## 2012-08-06 ENCOUNTER — Telehealth: Payer: Self-pay | Admitting: Family Medicine

## 2012-08-06 DIAGNOSIS — G4733 Obstructive sleep apnea (adult) (pediatric): Secondary | ICD-10-CM | POA: Insufficient documentation

## 2012-08-06 NOTE — Telephone Encounter (Signed)
[facility] Triage Call Report Triage Record Num: [id number] Operator: Teena Irani Patient Name: Gina [name] Call Date & Time: 08/05/2012 5:49:05PM Patient Phone: [phone] PCP: Ria Bush Patient Gender: Female PCP Fax : [phone] Patient DOB: 08-Jan-1951 Practice Name: [facility] Reason for Call: Caller: [name]; PCP: Ria Bush [facility]); CB#: [phone]; Call regarding Needs CPAP Equipment, states she was supposed to call on Monday to give the number it needs to be faxed to but forgot; states the nurse who tried it before said the fax number wouldn't work and the order needs to be refaxed. The company is [employer], the telephone number is [phone], spoke with Marjory Lies in the office there and verified the fax number is [phone]. Per patient request please refax orders to this number as soon as possible tomorrow. Denies any further needs tonight. Protocol(s) Used: Office Note Recommended Outcome per Protocol: Information Noted and Sent to Office Reason for Outcome: Caller information to office Care Advice: ~ 08/05/2012 6:07:51PM Page 1 of 1 [id number]

## 2012-08-06 NOTE — Telephone Encounter (Signed)
Ok to do this - see prior phone note dated 07/12/2012.  I already received records from prior PCP. placed CPAP script in [name]'s box.

## 2012-08-09 ENCOUNTER — Other Ambulatory Visit: Payer: Self-pay | Admitting: *Deleted

## 2012-08-09 NOTE — Telephone Encounter (Signed)
Refaxed script to number provided.

## 2012-08-10 DIAGNOSIS — F39 Unspecified mood [affective] disorder: Secondary | ICD-10-CM | POA: Diagnosis not present

## 2012-08-11 ENCOUNTER — Ambulatory Visit (INDEPENDENT_AMBULATORY_CARE_PROVIDER_SITE_OTHER): Payer: Medicare Other | Admitting: General Surgery

## 2012-08-11 ENCOUNTER — Encounter (INDEPENDENT_AMBULATORY_CARE_PROVIDER_SITE_OTHER): Payer: Self-pay | Admitting: General Surgery

## 2012-08-11 VITALS — BP 132/84 | HR 76 | Temp 98.1°F | Resp 18 | Ht 62.0 in | Wt 184.2 lb

## 2012-08-11 DIAGNOSIS — K432 Incisional hernia without obstruction or gangrene: Secondary | ICD-10-CM

## 2012-08-11 NOTE — Progress Notes (Signed)
Subjective:     Patient ID: Gina [name], female   DOB: November 14, 1950, 62 y.o.   MRN: [id number]  HPI In review, 62 year old Caucasian female comes in for checkup. I have only seen the patient on 2 previous occasion and one was in the emergency department on September 13. The patient underwent a complex laparoscopic ventral hernia repair with extensive lysis of adhesions at an outside hospital in [place] during the late summer. She had numerous postoperative complications. She was taken back to the operating room on several occasions. She had her surgical mesh removed. She had an episode of septic shock. She ultimately moved down to [facility] and presented to the emergency room with an enterocutaneous fistula. [facility] has been discharged home. She states that she is living independently in an apartment.she states that the nurse comes every other day to change the bandage. She reports minimal to no drainage from the wound. She denies any fever, chills, nausea, vomiting, diarrhea. She denies any abdominal pain.  She still denies any abdominal pain. She states that the wound on her abdominal wall is healed. It no longer has any drainage.  PMHx, PSHx, SOCHx, FAMHx, ALL reviewed   Review of Systems Negative except for above    Objective:   Physical Exam BP 132/84  Pulse 76  Temp 98.1 F (36.7 C) (Temporal)  Resp 18  Ht 5' 2"  (1.575 m)  Wt 184 lb 4 oz (83.575 kg)  BMI 33.70 kg/m2 Alert, nad abd- soft, obese. Healed midline incision. No skin defect. Large swiss cheese upper abdominal ventral incisional hernia. Reducible. nontender No edema Pupils equal, no icterus    Assessment:     Status post laparoscopic repair of recurrent ventral hernia with mesh with postoperative wound infection followed by abdominal reexploration with mesh removal and allograft placement at outside hospital; sepsis now  resolved Postoperative enterocutaneous fistula-resolved Recurrent ventral incisional hernia    Plan:     Her enterocutaneous fistula has healed. There does not to be any sequela. However as expected she has a recurrent ventral incisional hernia. We discussed signs and symptoms of incarceration and strangulation and what she should do should these occur. I believe her chance of incarceration or strangulation is low. I would not recommend a repeat hernia repair at least for another 6-8 months maybe even longer given her postoperative complications at the outside hospital. She was given educational material. Followup as needed  Leighton Ruff. [name], MD, FACS General, Bariatric, & Minimally Invasive Surgery [facility], [place]

## 2012-08-11 NOTE — Patient Instructions (Signed)
I would not recommend hernia repair at this time. I would recommend waiting an additional 6-8 months before consideration of recurrent hernia repair  Weight loss will help with your hernia. Drink 6-8 glasses of water per day Eat a high fiber diet  Hernia A hernia occurs when an internal organ pushes out through a weak spot in the abdominal wall. Hernias most commonly occur in the groin and around the navel. Hernias often can be pushed back into place (reduced). Most hernias tend to get worse over time. Some abdominal hernias can get stuck in the opening (irreducible or incarcerated hernia) and cannot be reduced. An irreducible abdominal hernia which is tightly squeezed into the opening is at risk for impaired blood supply (strangulated hernia). A strangulated hernia is a medical emergency. Because of the risk for an irreducible or strangulated hernia, surgery may be recommended to repair a hernia. CAUSES   Heavy lifting.  Prolonged coughing.  Straining to have a bowel movement.  A cut (incision) made during an abdominal surgery. HOME CARE INSTRUCTIONS   Bed rest is not required. You may continue your normal activities.  Avoid lifting more than 10 pounds (4.5 kg) or straining.  Cough gently. If you are a smoker it is best to stop. Even the best hernia repair can break down with the continual strain of coughing. Even if you do not have your hernia repaired, a cough will continue to aggravate the problem.  Do not wear anything tight over your hernia. Do not try to keep it in with an outside bandage or truss. These can damage abdominal contents if they are trapped within the hernia sac.  Eat a normal diet.  Avoid constipation. Straining over long periods of time will increase hernia size and encourage breakdown of repairs. If you cannot do this with diet alone, stool softeners may be used. SEEK IMMEDIATE MEDICAL CARE IF:   You have a fever.  You develop increasing abdominal pain.  You  feel nauseous or vomit.  Your hernia is stuck outside the abdomen, looks discolored, feels hard, or is tender.  You have any changes in your bowel habits or in the hernia that are unusual for you.  You have increased pain or swelling around the hernia.  You cannot push the hernia back in place by applying gentle pressure while lying down. MAKE SURE YOU:   Understand these instructions.  Will watch your condition.  Will get help right away if you are not doing well or get worse. Document Released: 07/21/2005 Document Revised: 10/13/2011 Document Reviewed: 03/09/2008 [facility] Patient Information 2013 [place].

## 2012-08-13 ENCOUNTER — Encounter: Payer: Self-pay | Admitting: Family Medicine

## 2012-08-16 ENCOUNTER — Other Ambulatory Visit: Payer: Self-pay | Admitting: Family Medicine

## 2012-08-16 DIAGNOSIS — Z5181 Encounter for therapeutic drug level monitoring: Secondary | ICD-10-CM

## 2012-08-16 DIAGNOSIS — I10 Essential (primary) hypertension: Secondary | ICD-10-CM

## 2012-08-16 DIAGNOSIS — N189 Chronic kidney disease, unspecified: Secondary | ICD-10-CM

## 2012-08-17 DIAGNOSIS — F39 Unspecified mood [affective] disorder: Secondary | ICD-10-CM | POA: Diagnosis not present

## 2012-08-18 ENCOUNTER — Telehealth: Payer: Self-pay

## 2012-08-18 NOTE — Telephone Encounter (Signed)
Pt has lab order from Dr."A" psychiatrist for depakote level; lab personnel said if not [facility] affilitated cannot draw lab test here. Pt said she will ck with Dr Roberts Gaudy office to see where she should have test done.

## 2012-08-20 ENCOUNTER — Other Ambulatory Visit: Payer: Medicare Other

## 2012-08-27 ENCOUNTER — Other Ambulatory Visit (HOSPITAL_COMMUNITY)
Admission: RE | Admit: 2012-08-27 | Discharge: 2012-08-27 | Disposition: A | Discharge status: Home / Self Care | Payer: Medicare Other | Source: Ambulatory Visit | Attending: Family Medicine | Admitting: Family Medicine

## 2012-08-27 ENCOUNTER — Encounter: Payer: Self-pay | Admitting: Family Medicine

## 2012-08-27 ENCOUNTER — Ambulatory Visit (INDEPENDENT_AMBULATORY_CARE_PROVIDER_SITE_OTHER): Payer: Medicare Other | Admitting: Family Medicine

## 2012-08-27 VITALS — BP 126/82 | HR 72 | Temp 98.5°F | Ht 61.5 in | Wt 177.2 lb

## 2012-08-27 DIAGNOSIS — Z1231 Encounter for screening mammogram for malignant neoplasm of breast: Secondary | ICD-10-CM | POA: Diagnosis not present

## 2012-08-27 DIAGNOSIS — R32 Unspecified urinary incontinence: Secondary | ICD-10-CM

## 2012-08-27 DIAGNOSIS — F319 Bipolar disorder, unspecified: Secondary | ICD-10-CM

## 2012-08-27 DIAGNOSIS — Z5181 Encounter for therapeutic drug level monitoring: Secondary | ICD-10-CM | POA: Diagnosis not present

## 2012-08-27 DIAGNOSIS — N189 Chronic kidney disease, unspecified: Secondary | ICD-10-CM

## 2012-08-27 DIAGNOSIS — Z01419 Encounter for gynecological examination (general) (routine) without abnormal findings: Secondary | ICD-10-CM | POA: Insufficient documentation

## 2012-08-27 DIAGNOSIS — E669 Obesity, unspecified: Secondary | ICD-10-CM

## 2012-08-27 DIAGNOSIS — I10 Essential (primary) hypertension: Secondary | ICD-10-CM | POA: Diagnosis not present

## 2012-08-27 DIAGNOSIS — Z0000 Encounter for general adult medical examination without abnormal findings: Secondary | ICD-10-CM

## 2012-08-27 DIAGNOSIS — Z1211 Encounter for screening for malignant neoplasm of colon: Secondary | ICD-10-CM

## 2012-08-27 DIAGNOSIS — Z113 Encounter for screening for infections with a predominantly sexual mode of transmission: Secondary | ICD-10-CM | POA: Diagnosis not present

## 2012-08-27 DIAGNOSIS — N760 Acute vaginitis: Secondary | ICD-10-CM | POA: Diagnosis not present

## 2012-08-27 DIAGNOSIS — C189 Malignant neoplasm of colon, unspecified: Secondary | ICD-10-CM

## 2012-08-27 LAB — CBC WITH DIFFERENTIAL/PLATELET
Basophils Absolute: 0 10*3/uL (ref 0.0–0.1)
Eosinophils Relative: 0.5 % (ref 0.0–5.0)
MCV: 86.8 fl (ref 78.0–100.0)
Monocytes Absolute: 1.2 10*3/uL — ABNORMAL HIGH (ref 0.1–1.0)
Monocytes Relative: 18 % — ABNORMAL HIGH (ref 3.0–12.0)
Neutrophils Relative %: 55.7 % (ref 43.0–77.0)
Platelets: 247 10*3/uL (ref 150.0–400.0)
WBC: 6.7 10*3/uL (ref 4.5–10.5)

## 2012-08-27 LAB — LIPID PANEL
Cholesterol: 142 mg/dL (ref 0–200)
HDL: 29.9 mg/dL — ABNORMAL LOW (ref >39.00)
LDL Cholesterol: 83 mg/dL (ref 0–99)
Total CHOL/HDL Ratio: 5
Triglycerides: 147 mg/dL (ref 0.0–149.0)
VLDL: 29.4 mg/dL (ref 0.0–40.0)

## 2012-08-27 LAB — COMPREHENSIVE METABOLIC PANEL
Albumin: 3.2 g/dL — ABNORMAL LOW (ref 3.5–5.2)
CO2: 28 mEq/L (ref 19–32)
GFR: 42.94 mL/min — ABNORMAL LOW (ref >60.00)
Glucose, Bld: 92 mg/dL (ref 70–99)
Potassium: 4.3 mEq/L (ref 3.5–5.1)
Sodium: 137 mEq/L (ref 135–145)
Total Protein: 7.7 g/dL (ref 6.0–8.3)

## 2012-08-27 LAB — POCT URINALYSIS DIPSTICK
Bilirubin, UA: NEGATIVE
Glucose, UA: NEGATIVE
Ketones, UA: NEGATIVE
Spec Grav, UA: 1.015

## 2012-08-27 LAB — TSH: TSH: 2.5 u[IU]/mL (ref 0.35–5.50)

## 2012-08-27 MED ORDER — LOVASTATIN 40 MG PO TABS: 40.0000 mg | ORAL_TABLET | Freq: Every day | ORAL | Status: DC

## 2012-08-27 NOTE — Patient Instructions (Addendum)
Stop valproic acid, continue depakote ER (tablets) 500mg  daily.  Double check with Dr. Mervyn Skeeters to ensure this is what he wanted. Pass by [name]'s office to schedule mammogram. Return at your convenience to discuss falls and urine. Urine checked today (UA) Blood work today.

## 2012-08-27 NOTE — Assessment & Plan Note (Signed)
Sent home with iFOB today.  No recent colonoscopy.  May be due.

## 2012-08-27 NOTE — Assessment & Plan Note (Signed)
bp stable today.  Off metoprolol for several months now. Will remove this problem from list.

## 2012-08-27 NOTE — Assessment & Plan Note (Signed)
Check depakote level today - but may be elevated as pt was taking double dose.  See above.

## 2012-08-27 NOTE — Assessment & Plan Note (Signed)
Established with psychiatrist Dr. Rogers Blocker of [facility] in [place]. Reviewing meds today - has both depakote ER 500mg  daily as well as depakote IM capsules 250mg  bid, and reports taking both. I anticipate ER version was started in place of immediate release - so have asked her to stop immediate release. depakote level today - would expect some elevated.

## 2012-08-27 NOTE — Assessment & Plan Note (Addendum)
Check UA today - mod blood, sm LE, however micro unremarkable. Regardless sent UCx to eval for infx as cause of increased incontinence. I did ask her to return to further evaluate this.

## 2012-08-27 NOTE — Assessment & Plan Note (Addendum)
I have personally reviewed the Medicare Annual Wellness questionnaire and have noted 1. The patient's medical and social history 2. Their use of alcohol, tobacco or illicit drugs 3. Their current medications and supplements 4. The patient's functional ability including ADL's, fall risks, home safety risks and hearing or visual impairment 5. Diet and physical activity 6. Evidence for depression or mood disorders The patients weight, height, BMI have been recorded in the chart.  Hearing and vision has been addressed. I have made referrals, counseling and provided education to the patient based review of the above and I have provided the pt with a written personalized care plan for preventive services. See scanned questionairre. Advanced directives: did not discuss at this visit.  Reviewed preventative protocols and updated unless pt declined. Sent home with iFOB today. CBE today.  Schedule mammogram. Unclear reason for hysterectomy - pap performed today. Return to discuss falls risk - tested positive for falls risk screen.

## 2012-08-27 NOTE — Progress Notes (Signed)
Subjective:    Patient ID: Gina [name], female    DOB: 1951/01/01, 62 y.o.   MRN: [id number]  HPI CC: medicare wellness visit  Recent complicated hospital course for enterocutaneous fistula. Prior complicated hospitalization 02/2012 in [place] for ventral hernia repair wound dehiscence s/p I&D, wound vac placement, re exploration and allograft placement with wound infection growing VRE and MRSA. Sent to SNF for rehab, treated with IV flagyl and daptomycin. Has continue to see Dr. Andrey Campanile, recently released from his care - wound healed well.  H/o bipolar 1 - dx age 65 yo.  Sees Dr. Rogers Blocker in [place] (psychiatrist) [facility] (fax [phone], ph [phone]).  Requests depakote level faxed to him.  Notices wetting herself in am for last few weeks.  Denies dysuria or hematuria.  Denies daytime trouble, just first thing in am.  During hospitalization, there was concern for R sided adnexal mass on initially CT scan 04/07/2012, but on repeat CT scan 04/21/2012, no adnexal mass was found.   Nonsmoker.  Sister smokes 2 ppd.  Hearing and vision screen passed. Falls risk + 2+ falls in last year depression screen - negative.  Lives with sister, no pets  Occupation: disabled, for bipolar and arthritis  Edu: GED  Activity: take walks  Diet: good water, vegetables daily   Preventative:  States last CPE was a few months ago.  She is s/p hysterectomy and LSO, unclear reason as to hysterectomy but pt states there may have been some concern for cancer - pap last year 2012 normal.  Due for rpt today. Requests mammogram and breast exam today. H/o colon cancer s/p colectomy 2008. No recent colonoscopy. Discussed iFOB today. Flu shot in hospital per pt.  Updated chart. Tetanus shot allergy in chart, pt states may have caused small rash - consider retrial in future. Shingles - will need to discuss.  Medications and allergies reviewed and updated in chart.  Past histories  reviewed and updated if relevant as below. Patient Active Problem List  Diagnosis  . Colon cancer  . Enterocutaneous fistula  . Sleep apnea  . HTN (hypertension), benign  . Bipolar 1 disorder  . Intertrigo  . CRI (chronic renal insufficiency)  . Obesity (BMI 30-39.9)  . Adnexal mass  . OSA on CPAP  . Recurrent ventral hernia  . Therapeutic drug monitoring   Past Medical History  Diagnosis Date  . Hypertension   . Bipolar depression   . DJD (degenerative joint disease), lumbar     chronic lower back pain  . CRI (chronic renal insufficiency)   . Obesity (BMI 30-39.9)   . History of colon cancer 2012    s/p surgery  . Enterocutaneous fistula 04/07/2012    completed PT 06/2012 {([facility])  . History of bladder cancer 1997  . History of uterine cancer     s/p hysterectomy  . OSA on CPAP     6cm H2O  . RBBB    Past Surgical History  Procedure Date  . Hernia repair 02/04/12  . Partial colectomy about 2008    for colon cancer  . I & d abdominal wound 02/19/12  . Removal of infected mesh and abdominal wound vac placement 02/24/12  . Reexploration of abdominal wound and allograft placemet 02/26/12  . Partial hysterectomy 1981    uterine cancer  . Left oophorectomy 2005  . Colonoscopy 07/2009  . Dexa 12/2009    WNL  . Dobutamine stress echo 12/2009    no evidence of ischemia  History  Substance Use Topics  . Smoking status: Never Smoker   . Smokeless tobacco: Never Used  . Alcohol Use: No   Family History  Problem Relation Age of Onset  . Cancer Mother     stomach  . Cancer Sister     colon  . CAD Father     MI  . Diabetes Other     aunts and uncles both sides  . Arthritis Other     strong fmhx  . Mental illness Sister     anxiety/depression  . Cancer Maternal Grandmother     breast   Allergies  Allergen Reactions  . Penicillins   . Ivp Dye (Iodinated Diagnostic Agents) Rash  . Tetanus Toxoids Rash    Small rash, not hives, no swelling.  Wants to try  again.  Marland Kitchen Zetia (Ezetimibe) Rash   Current Outpatient Prescriptions on File Prior to Visit  Medication Sig Dispense Refill  . cholecalciferol (VITAMIN D) 400 UNITS TABS Take 400 Units by mouth daily.      Marland Kitchen FLUoxetine (PROZAC) 40 MG capsule Take 1 capsule (40 mg total) by mouth daily.  90 capsule  1  . lovastatin (MEVACOR) 40 MG tablet Take 1 tablet (40 mg total) by mouth at bedtime.  30 tablet  3  . zolpidem (AMBIEN) 10 MG tablet Take 10 mg by mouth at bedtime as needed.      . calcium carbonate (OS-CAL) 600 MG TABS Take 600 mg by mouth 2 (two) times daily with a meal.      . nystatin cream (MYCOSTATIN) Apply topically 2 (two) times daily.  30 g  0     Review of Systems  Constitutional: Negative for fever, chills, activity change, appetite change, fatigue and unexpected weight change.  HENT: Negative for hearing loss and neck pain.   Eyes: Negative for visual disturbance.  Respiratory: Negative for cough, chest tightness, shortness of breath and wheezing.   Cardiovascular: Negative for chest pain, palpitations and leg swelling.  Gastrointestinal: Positive for blood in stool (none recently, sometimes with wiping). Negative for nausea, vomiting, abdominal pain, diarrhea, constipation and abdominal distention.  Genitourinary: Negative for hematuria and difficulty urinating.  Musculoskeletal: Negative for myalgias and arthralgias.  Skin: Negative for rash.  Neurological: Positive for headaches. Negative for dizziness, seizures and syncope.  Hematological: Does not bruise/bleed easily.  Psychiatric/Behavioral: Negative for dysphoric mood. The patient is not nervous/anxious.        Objective:   Physical Exam  Nursing note and vitals reviewed. Constitutional: She is oriented to person, place, and time. She appears well-developed and well-nourished. No distress.  HENT:  Head: Normocephalic and atraumatic.  Right Ear: Hearing, tympanic membrane, external ear and ear canal normal.  Left  Ear: Hearing, tympanic membrane, external ear and ear canal normal.  Nose: Nose normal.  Mouth/Throat: Oropharynx is clear and moist. No oropharyngeal exudate.  Eyes: Conjunctivae normal and EOM are normal. Pupils are equal, round, and reactive to light. No scleral icterus.  Neck: Normal range of motion. Neck supple. No thyromegaly present.  Cardiovascular: Normal rate, regular rhythm, normal heart sounds and intact distal pulses.   No murmur heard. Pulses:      Radial pulses are 2+ on the right side, and 2+ on the left side.  Pulmonary/Chest: Effort normal and breath sounds normal. No respiratory distress. She has no wheezes. She has no rales. Right breast exhibits no inverted nipple, no mass, no nipple discharge, no skin change and no tenderness. Left breast  exhibits no inverted nipple, no mass, no nipple discharge, no skin change and no tenderness. Breasts are symmetrical.  Abdominal: Soft. Bowel sounds are normal. She exhibits no distension and no mass. There is no tenderness. There is no rebound and no guarding.  Genitourinary: Pelvic exam was performed with patient supine. No labial fusion. There is no rash, tenderness, lesion or injury on the right labia. There is no rash, tenderness, lesion or injury on the left labia. Right adnexum displays no mass, no tenderness and no fullness. Left adnexum displays no mass, no tenderness and no fullness. No erythema, tenderness or bleeding around the vagina. No foreign body around the vagina. No signs of injury around the vagina. Vaginal discharge (mild yellow discharge) found.       Cervix, uterus absent. Vaginal cuff pap performed  Musculoskeletal: Normal range of motion. She exhibits no edema.  Lymphadenopathy:    She has no cervical adenopathy.    She has no axillary adenopathy.       Right axillary: No lateral adenopathy present.       Left axillary: No lateral adenopathy present.      Right: No supraclavicular adenopathy present.       Left: No  supraclavicular adenopathy present.  Neurological: She is alert and oriented to person, place, and time.       CN grossly intact, station and gait intact  Skin: Skin is warm and dry. No rash noted.  Psychiatric: She has a normal mood and affect. Her behavior is normal. Judgment and thought content normal.       Assessment & Plan:

## 2012-08-29 LAB — URINE CULTURE

## 2012-08-30 ENCOUNTER — Encounter: Payer: Self-pay | Admitting: *Deleted

## 2012-08-31 ENCOUNTER — Encounter: Payer: Self-pay | Admitting: *Deleted

## 2012-09-01 ENCOUNTER — Telehealth: Payer: Self-pay

## 2012-09-01 NOTE — Telephone Encounter (Signed)
Pt wanted to know where to mail stool specimen to; pt said there is an address on the envelope with the stool specimen container; advised pt can mail in that envelope. Pt voiced understanding.

## 2012-09-10 DIAGNOSIS — F39 Unspecified mood [affective] disorder: Secondary | ICD-10-CM | POA: Diagnosis not present

## 2012-09-14 ENCOUNTER — Other Ambulatory Visit: Payer: Medicare Other

## 2012-09-14 DIAGNOSIS — Z1211 Encounter for screening for malignant neoplasm of colon: Secondary | ICD-10-CM

## 2012-09-14 LAB — FECAL OCCULT BLOOD, IMMUNOCHEMICAL: Fecal Occult Bld: POSITIVE

## 2012-09-15 ENCOUNTER — Telehealth: Payer: Self-pay

## 2012-09-15 NOTE — Telephone Encounter (Signed)
Pt request stool specimen results when available.

## 2012-09-19 ENCOUNTER — Other Ambulatory Visit: Payer: Self-pay | Admitting: Family Medicine

## 2012-09-19 DIAGNOSIS — R195 Other fecal abnormalities: Secondary | ICD-10-CM

## 2012-09-19 NOTE — Telephone Encounter (Signed)
See lab result

## 2012-09-20 ENCOUNTER — Encounter: Payer: Self-pay | Admitting: Internal Medicine

## 2012-09-20 NOTE — Telephone Encounter (Signed)
Patient notified and is waiting to hear from [facility] for GI referral.

## 2012-09-22 ENCOUNTER — Ambulatory Visit: Payer: Self-pay | Admitting: Family Medicine

## 2012-09-22 DIAGNOSIS — Z1231 Encounter for screening mammogram for malignant neoplasm of breast: Secondary | ICD-10-CM | POA: Diagnosis not present

## 2012-09-30 ENCOUNTER — Encounter: Payer: Self-pay | Admitting: Family Medicine

## 2012-10-01 ENCOUNTER — Ambulatory Visit (INDEPENDENT_AMBULATORY_CARE_PROVIDER_SITE_OTHER): Payer: Medicare Other | Admitting: Family Medicine

## 2012-10-01 ENCOUNTER — Encounter: Payer: Self-pay | Admitting: Family Medicine

## 2012-10-01 VITALS — BP 118/78 | HR 80 | Temp 98.7°F | Wt 176.8 lb

## 2012-10-01 DIAGNOSIS — R32 Unspecified urinary incontinence: Secondary | ICD-10-CM | POA: Diagnosis not present

## 2012-10-01 DIAGNOSIS — M719 Bursopathy, unspecified: Secondary | ICD-10-CM | POA: Diagnosis not present

## 2012-10-01 DIAGNOSIS — R0981 Nasal congestion: Secondary | ICD-10-CM

## 2012-10-01 DIAGNOSIS — R296 Repeated falls: Secondary | ICD-10-CM

## 2012-10-01 DIAGNOSIS — M7552 Bursitis of left shoulder: Secondary | ICD-10-CM

## 2012-10-01 DIAGNOSIS — M25561 Pain in right knee: Secondary | ICD-10-CM

## 2012-10-01 DIAGNOSIS — Z9181 History of falling: Secondary | ICD-10-CM | POA: Diagnosis not present

## 2012-10-01 DIAGNOSIS — M25569 Pain in unspecified knee: Secondary | ICD-10-CM

## 2012-10-01 DIAGNOSIS — M67919 Unspecified disorder of synovium and tendon, unspecified shoulder: Secondary | ICD-10-CM | POA: Diagnosis not present

## 2012-10-01 DIAGNOSIS — J3489 Other specified disorders of nose and nasal sinuses: Secondary | ICD-10-CM

## 2012-10-01 LAB — POCT URINALYSIS DIPSTICK
Ketones, UA: NEGATIVE
Leukocytes, UA: NEGATIVE
Nitrite, UA: NEGATIVE
pH, UA: 6.5

## 2012-10-01 MED ORDER — ZOLPIDEM TARTRATE 5 MG PO TABS: 5.0000 mg | ORAL_TABLET | Freq: Every evening | ORAL | Status: DC | PRN

## 2012-10-01 NOTE — Patient Instructions (Addendum)
Mammogram returned normal.  Recommend repeat in 1 year. Keep appointment for colonoscopy in April. For nose - start using over the counter nasal saline as needed. Cut ambien in half - new dose will be 5 mg.  I wonder if this is contributing to trouble voiding. Ok to drink as much as you want the first part of the day, try to cut back some in late afternoon and night. Call your insurance about the shingles shot to see if it is covered or how much it would cost and where is cheaper (here or pharmacy).  If you want to receive here, call for nurse visit. I think you have arthritis of knee and bursitis of shoulder - tylenol 500mg  as needed, ice pack to shoulder, and stretching exercises for shoulder.

## 2012-10-01 NOTE — Progress Notes (Signed)
  Subjective:    Patient ID: Gina [name], female    DOB: [birth year], 62 y.o.   MRN: [id number]  HPI CC: discuss falls and urine incontinence  Seen here last month for medicare wellness visit, tested positive for falls risk assessment. Falls risk - 2 falls in last year. No falls in last month.  Endorses last fall 2/2 drug overdose - was taking too high dose of per psychotropic meds. Denies dizziness, lightheadedness or syncope. Get up and go test = 31 second.  Very slow gait.    Urine - Notices wetting herself in am for last few weeks. Nocturia x 2-3, wakes up with urge to void. When has accidents, complete emptying of bladder. Sleeps with depens. Denies dysuria or hematuria. Denies daytime trouble, just first thing in am. UA last month with small LE and mod blood, but micro reassuring.  UCx with 20k MBM. On ambien 10mg  and depakote 1000mg  ER, both at night time. Drinks lots of water.  Stops drinking at 9pm, but tries to minimize fluid intake in late afternoons and evenings.  R knee pain also with L shoulder ache.  Nose burning - no h/o allergies.  Rhinorrhea.   Intermittent constipation - responsive to miralax prn.   Past Medical History  Diagnosis Date  . Hypertension   . Bipolar depression   . DJD (degenerative joint disease), lumbar     chronic lower back pain  . CRI (chronic renal insufficiency)   . Obesity (BMI 30-39.9)   . History of colon cancer 2012    s/p surgery  . Enterocutaneous fistula 04/07/2012    completed PT 06/2012 {([facility])  . History of bladder cancer 1997  . History of uterine cancer     s/p hysterectomy  . OSA on CPAP     6cm H2O  . RBBB   . Glaucoma     "a little in left eye"     Review of Systems Per HPI    Objective:   Physical Exam  Nursing note and vitals reviewed. Constitutional: She appears well-developed and well-nourished. No distress.  HENT:  Head: Normocephalic and atraumatic.  Right Ear: Hearing, tympanic membrane,  external ear and ear canal normal.  Left Ear: Hearing, tympanic membrane, external ear and ear canal normal.  Mouth/Throat: Uvula is midline, oropharynx is clear and moist and mucous membranes are normal. No oropharyngeal exudate, posterior oropharyngeal edema, posterior oropharyngeal erythema or tonsillar abscesses.  Irritated nasal mucosa  Neck: Normal range of motion. Neck supple.  Musculoskeletal:  L subacromial bursa tender to palpation.  FROM at shoulder R knee with crepitus present.    Neurological:  Slowed gait Get up and go test = 31 sec       Assessment & Plan:

## 2012-10-02 ENCOUNTER — Encounter: Payer: Self-pay | Admitting: Family Medicine

## 2012-10-02 DIAGNOSIS — M1710 Unilateral primary osteoarthritis, unspecified knee: Secondary | ICD-10-CM | POA: Insufficient documentation

## 2012-10-02 DIAGNOSIS — M7552 Bursitis of left shoulder: Secondary | ICD-10-CM | POA: Insufficient documentation

## 2012-10-02 NOTE — Assessment & Plan Note (Signed)
tylenol 500mg  as needed, ice pack to shoulder, and stretching exercises for shoulder.

## 2012-10-02 NOTE — Assessment & Plan Note (Signed)
Rechecked UA today - reassuringly WNL. Surprising that sxs are only during night and early am. She is on both ambien 10mg  and depakote 1000mg  XR nightly.  I wonder if this is blunting urinary urge leading to waking up too soon. I recommended start by cutting down on ambien to 5mg  nightly.  Continue depakote. To update me with response to this change.

## 2012-10-02 NOTE — Assessment & Plan Note (Signed)
tylenol 500mg  as needed, rest, elevate, ice knee.

## 2012-10-02 NOTE — Assessment & Plan Note (Signed)
Attributed to dry air - rec regular nasal saline irrigation

## 2012-10-02 NOTE — Assessment & Plan Note (Signed)
She has had multiple falls, but attributes this to prior med regimen. Encouraged slower more deliberate movements. If rpt falls, would refer to physical therapy for fall prevention. Hopefully decreasing ambien dose will help with this.

## 2012-10-06 ENCOUNTER — Telehealth: Payer: Self-pay | Admitting: *Deleted

## 2012-10-06 NOTE — Telephone Encounter (Signed)
Patient's sister calling stating she needs a note that she can't open the filter in her apartment because of the operations she's had, per the apt complex they need to change the filter once a month and she can't get the filter out. Please advise

## 2012-10-07 NOTE — Telephone Encounter (Signed)
Pt left v/m requesting status of note; pt request call back [phone].

## 2012-10-07 NOTE — Telephone Encounter (Signed)
Spoke with patient and her sister. Neither can change the air/furnace filter because of their hernia history. Patient has had 3 hernia repairs and her sister has had 28 hernia repairs. They are both afraid that if they pull on the door to open it to get to the filter, they will cause another hernia. They need a note for their landlord explaining this and excusing them from changing the filter themselves.   Gina [name] also wanted to know what you suggested she do about the incontinence issues since her UA was clear.

## 2012-10-07 NOTE — Telephone Encounter (Signed)
Can we call to clarify what type of note she needs?

## 2012-10-08 MED ORDER — OXYBUTYNIN CHLORIDE 5 MG PO TABS: 5.0000 mg | ORAL_TABLET | Freq: Every day | ORAL | Status: DC

## 2012-10-08 NOTE — Telephone Encounter (Signed)
Letter printed and placed in [name]'s box.  Did cutting down on ambien to 8m help at all? Would suggest stop ambien completely. We could also do trial of incontinence medication at night - I have sent in oxybutynin.  Watch for dry mouth on this medicine.

## 2012-10-13 NOTE — Telephone Encounter (Signed)
Message left notifying patient. Letter placed up front for pick up.

## 2012-10-25 ENCOUNTER — Ambulatory Visit (AMBULATORY_SURGERY_CENTER): Payer: Medicare Other | Admitting: *Deleted

## 2012-10-25 VITALS — Ht 62.0 in | Wt 172.0 lb

## 2012-10-25 DIAGNOSIS — Z85038 Personal history of other malignant neoplasm of large intestine: Secondary | ICD-10-CM

## 2012-10-25 DIAGNOSIS — Z1211 Encounter for screening for malignant neoplasm of colon: Secondary | ICD-10-CM

## 2012-10-25 NOTE — Progress Notes (Signed)
Pt given SUPREP sample for her prep for colon

## 2012-11-02 HISTORY — PX: COLONOSCOPY: SHX174

## 2012-11-03 DIAGNOSIS — F39 Unspecified mood [affective] disorder: Secondary | ICD-10-CM | POA: Diagnosis not present

## 2012-11-08 ENCOUNTER — Ambulatory Visit (AMBULATORY_SURGERY_CENTER): Payer: Medicare Other | Admitting: Internal Medicine

## 2012-11-08 ENCOUNTER — Encounter: Payer: Self-pay | Admitting: Internal Medicine

## 2012-11-08 VITALS — BP 96/48 | HR 55 | Temp 98.8°F | Resp 16 | Ht 62.0 in | Wt 172.0 lb

## 2012-11-08 DIAGNOSIS — D126 Benign neoplasm of colon, unspecified: Secondary | ICD-10-CM

## 2012-11-08 DIAGNOSIS — Z1211 Encounter for screening for malignant neoplasm of colon: Secondary | ICD-10-CM | POA: Diagnosis not present

## 2012-11-08 DIAGNOSIS — Z85038 Personal history of other malignant neoplasm of large intestine: Secondary | ICD-10-CM

## 2012-11-08 DIAGNOSIS — F319 Bipolar disorder, unspecified: Secondary | ICD-10-CM | POA: Diagnosis not present

## 2012-11-08 DIAGNOSIS — G4733 Obstructive sleep apnea (adult) (pediatric): Secondary | ICD-10-CM | POA: Diagnosis not present

## 2012-11-08 DIAGNOSIS — K5730 Diverticulosis of large intestine without perforation or abscess without bleeding: Secondary | ICD-10-CM

## 2012-11-08 DIAGNOSIS — I10 Essential (primary) hypertension: Secondary | ICD-10-CM | POA: Diagnosis not present

## 2012-11-08 DIAGNOSIS — Z800 Family history of malignant neoplasm of digestive organs: Secondary | ICD-10-CM

## 2012-11-08 MED ORDER — SODIUM CHLORIDE 0.9 % IV SOLN: 500.0000 mL | INTRAVENOUS | Status: DC

## 2012-11-08 NOTE — Patient Instructions (Addendum)
I found and removed 3 polyps today - they were very small and look benign.  You also have diverticulosis - a common condition that usually does not cause problems.  I will let you know pathology results and when to have another routine colonoscopy by mail.  I also think you could benefit from genetic counseling given your family history and your personal history of cancers.  I will try to arrange that after the pathology returns.  Thank you for choosing me and [facility].  Gatha Mayer, MD, FACG  YOU HAD AN ENDOSCOPIC PROCEDURE TODAY AT [facility]: Refer to the procedure report that was given to you for any specific questions about what was found during the examination.  If the procedure report does not answer your questions, please call your gastroenterologist to clarify.  If you requested that your care partner not be given the details of your procedure findings, then the procedure report has been included in a sealed envelope for you to review at your convenience later.  YOU SHOULD EXPECT: Some feelings of bloating in the abdomen. Passage of more gas than usual.  Walking can help get rid of the air that was put into your GI tract during the procedure and reduce the bloating. If you had a lower endoscopy (such as a colonoscopy or flexible sigmoidoscopy) you may notice spotting of blood in your stool or on the toilet paper. If you underwent a bowel prep for your procedure, then you may not have a normal bowel movement for a few days.  DIET: Your first meal following the procedure should be a light meal and then it is ok to progress to your normal diet.  A half-sandwich or bowl of soup is an example of a good first meal.  Heavy or fried foods are harder to digest and may make you feel nauseous or bloated.  Likewise meals heavy in dairy and vegetables can cause extra gas to form and this can also increase the bloating.  Drink plenty of fluids but you should avoid  alcoholic beverages for 24 hours.  ACTIVITY: Your care partner should take you home directly after the procedure.  You should plan to take it easy, moving slowly for the rest of the day.  You can resume normal activity the day after the procedure however you should NOT DRIVE or use heavy machinery for 24 hours (because of the sedation medicines used during the test).    SYMPTOMS TO REPORT IMMEDIATELY: A gastroenterologist can be reached at any hour.  During normal business hours, 8:30 AM to 5:00 PM Monday through Friday, call [phone].  After hours and on weekends, please call the GI answering service at [phone] who will take a message and have the physician on call contact you.   Following lower endoscopy (colonoscopy or flexible sigmoidoscopy):  Excessive amounts of blood in the stool  Significant tenderness or worsening of abdominal pains  Swelling of the abdomen that is new, acute  Fever of 100F or higher  FOLLOW UP: If any biopsies were taken you will be contacted by phone or by letter within the next 1-3 weeks.  Call your gastroenterologist if you have not heard about the biopsies in 3 weeks.  Our staff will call the home number listed on your records the next business day following your procedure to check on you and address any questions or concerns that you may have at that time regarding the information given to you following your  procedure. This is a courtesy call and so if there is no answer at the home number and we have not heard from you through the emergency physician on call, we will assume that you have returned to your regular daily activities without incident.  SIGNATURES/CONFIDENTIALITY: You and/or your care partner have signed paperwork which will be entered into your electronic medical record.  These signatures attest to the fact that that the information above on your After Visit Summary has been reviewed and is understood.  Full responsibility of the  confidentiality of this discharge information lies with you and/or your care-partner.

## 2012-11-08 NOTE — Op Note (Signed)
[facility] [address] [place], [address]   COLONOSCOPY PROCEDURE REPORT  PATIENT: Gina [name], Gina [name]  MR#: [id number] BIRTHDATE: [birth year] , 61  yrs. old GENDER: Female ENDOSCOPIST: Iva Boop, MD, [facility] REFERRED IO:NGEXBM Sharen Hones, M.D. PROCEDURE DATE:  11/08/2012 PROCEDURE:   Colonoscopy with biopsy ASA CLASS:   Class III INDICATIONS:High risk patient with personal history of colon cancer and Patient's immediate family history of colon cancer. MEDICATIONS: propofol (Diprivan) 200mg  IV, MAC sedation, administered by CRNA, and These medications were titrated to patient response per physician's verbal order  DESCRIPTION OF PROCEDURE:   After the risks benefits and alternatives of the procedure were thoroughly explained, informed consent was obtained.  A digital rectal exam revealed no abnormalities of the rectum.   The LB CF-[id number]  endoscope was introduced through the anus and advanced to the cecum, which was identified by both the appendix and ileocecal valve. No adverse events experienced.   The quality of the prep was Suprep excellent The instrument was then slowly withdrawn as the colon was fully examined.     COLON FINDINGS: Three diminutive polypoid shaped sessile polyps were found in the descending colon and sigmoid colon.  A polypectomy was performed with cold forceps.  The resection was complete and the polyp tissue was completely retrieved.   Mild diverticulosis was noted The finding was in the left colon.   There was evidence of a prior end-to-end colo-colonic surgical anastomosis in the transverse colon.   The colon mucosa was otherwise normal.   A right colon retroflexion was performed.  Retroflexed views revealed no abnormalities. The time to cecum=3 minutes 40 seconds. Withdrawal time=9 minutes 12 seconds.  The scope was withdrawn and the procedure completed. COMPLICATIONS: There were no complications.  ENDOSCOPIC  IMPRESSION: 1.   Three diminutive sessile polyps were found in the descending colon and sigmoid colon; polypectomy was performed with cold forceps 2.   Mild diverticulosis was noted in the left colon 3.   There was evidence of a prior colo-colonic surgical anastomosis in the transverse colon 4.   The colon mucosa was otherwise normal  RECOMMENDATIONS: 1.  Timing of repeat colonoscopy will be determined by pathology findings. 2.   Needs genetic screening/counseling I think - (family hx and personal hx (colon and uternine Ca) - will arrange after path review eSigned:  Iva Boop, MD, [facility] 11/08/2012 9:48 AM   cc: Eustaquio Boyden MD and The Patient

## 2012-11-08 NOTE — Progress Notes (Signed)
Called to room to assist during endoscopic procedure.  Patient ID and intended procedure confirmed with present staff. Received instructions for my participation in the procedure from the performing physician.  

## 2012-11-08 NOTE — Progress Notes (Signed)
Patient did not have preoperative order for IV antibiotic SSI prophylaxis. (G8918)  Patient did not experience any of the following events: a burn prior to discharge; a fall within the facility; wrong site/side/patient/procedure/implant event; or a hospital transfer or hospital admission upon discharge from the facility. (G8907)  

## 2012-11-09 ENCOUNTER — Telehealth: Payer: Self-pay

## 2012-11-09 NOTE — Telephone Encounter (Signed)
  Follow up Call-  Call back number 11/08/2012  Post procedure Call Back phone  # [phone] hm  Permission to leave phone message Yes     Patient questions:  Do you have a fever, pain , or abdominal swelling? no Pain Score  0 *  Have you tolerated food without any problems? yes  Have you been able to return to your normal activities? yes  Do you have any questions about your discharge instructions: Diet   no Medications  no Follow up visit  no  Do you have questions or concerns about your Care? no  Actions: * If pain score is 4 or above: No action needed, pain <4.  I spoke to the pt's sister who said the pt was still asleep.  I asked her if the pt did ok last pm, "as far as I know she did".  I asked her to let the pt know I called to check on her and call us back if she has any questions or concerns.  She said she would. Maw

## 2012-11-10 ENCOUNTER — Telehealth: Payer: Self-pay | Admitting: Family Medicine

## 2012-11-10 DIAGNOSIS — F39 Unspecified mood [affective] disorder: Secondary | ICD-10-CM | POA: Diagnosis not present

## 2012-11-10 MED ORDER — OXYBUTYNIN CHLORIDE 5 MG PO TABS: 5.0000 mg | ORAL_TABLET | Freq: Two times a day (BID) | ORAL | Status: DC

## 2012-11-10 NOTE — Telephone Encounter (Signed)
Sent in refill - I'd like her to use twice daily instead of nightly - watch for dry mouth, imbalance.  Can go up to 3 times daily.

## 2012-11-10 NOTE — Telephone Encounter (Signed)
Patient Information:  Caller Name: Floyd  Phone: [phone]  Patient: Gina [name], Gina [name]  Gender: Female  DOB: 1951/02/23  Age: 62 Years  PCP: [facility])  Office Follow Up:  Does the office need to follow up with this patient?: Yes  Instructions For The Office: Please review information, requesting refill on Ditropan at higher dose or possible change to another medication.  Can contact patient at   [phone].   Symptoms  Reason For Call & Symptoms: Had frequency of urination, incontinence during the nights and in the mornings for about 2 months then seen in office, started Ditropan QD on 10/08/2012.  Medication has decreased the urination some during the night and in the mornings but still present.  Sometimes has taken 2 pills during the day.  Did not have refill on prescription.  Asking for refill of Rx at increased dose if possible or change to another medication since some improvement only.  Reviewed Health History In EMR: Yes  Reviewed Medications In EMR: Yes  Reviewed Allergies In EMR: Yes  Reviewed Surgeries / Procedures: Yes  Date of Onset of Symptoms: Unknown  Treatments Tried: Ditropan  Treatments Tried Worked: Yes  Guideline(s) Used:  Urination Pain - Female  No Protocol Available - Information Only  Disposition Per Guideline:   Discuss with PCP and Callback by Nurse Today  Reason For Disposition Reached:   Nursing judgment  Advice Given:  N/A  Patient Will Follow Care Advice:  YES

## 2012-11-11 ENCOUNTER — Other Ambulatory Visit: Payer: Self-pay

## 2012-11-11 ENCOUNTER — Encounter: Payer: Self-pay | Admitting: Internal Medicine

## 2012-11-11 DIAGNOSIS — Z860100 Personal history of colon polyps, unspecified: Secondary | ICD-10-CM | POA: Insufficient documentation

## 2012-11-11 DIAGNOSIS — Z85038 Personal history of other malignant neoplasm of large intestine: Secondary | ICD-10-CM

## 2012-11-11 HISTORY — DX: Personal history of colonic polyps: Z86.010

## 2012-11-11 HISTORY — DX: Personal history of colon polyps, unspecified: Z86.0100

## 2012-11-11 NOTE — Telephone Encounter (Signed)
Patient's sister came in for visit today. Gave copy of instructions to her to relay to patient. Advised to have patient call me if she had questions.

## 2012-11-12 ENCOUNTER — Telehealth: Payer: Self-pay | Admitting: *Deleted

## 2012-11-12 ENCOUNTER — Encounter: Payer: Self-pay | Admitting: Family Medicine

## 2012-11-12 ENCOUNTER — Telehealth: Payer: Self-pay | Admitting: Genetic Counselor

## 2012-11-12 NOTE — Telephone Encounter (Signed)
Form for CPAP supplies in your IN box

## 2012-11-12 NOTE — Telephone Encounter (Signed)
Filled and placed in my out box. 

## 2012-11-12 NOTE — Telephone Encounter (Signed)
LVOM FOR PT TO RETURN CALL IN RE TO GENETIC APPT.

## 2012-11-29 ENCOUNTER — Telehealth: Payer: Self-pay

## 2012-11-29 NOTE — Telephone Encounter (Signed)
Pt wanted to know when she was to take her Lovastatin; advised pt instructions for Lovastatin are to take one tablet by mouth at bedtime.pt voiced understanding.

## 2012-12-01 ENCOUNTER — Other Ambulatory Visit: Payer: Medicare Other | Admitting: Lab

## 2012-12-09 ENCOUNTER — Ambulatory Visit (INDEPENDENT_AMBULATORY_CARE_PROVIDER_SITE_OTHER): Payer: Medicare Other | Admitting: Family Medicine

## 2012-12-09 ENCOUNTER — Encounter: Payer: Self-pay | Admitting: Family Medicine

## 2012-12-09 VITALS — BP 116/80 | HR 72 | Temp 98.8°F | Wt 171.5 lb

## 2012-12-09 DIAGNOSIS — R32 Unspecified urinary incontinence: Secondary | ICD-10-CM

## 2012-12-09 DIAGNOSIS — C189 Malignant neoplasm of colon, unspecified: Secondary | ICD-10-CM

## 2012-12-09 MED ORDER — OXYBUTYNIN CHLORIDE 5 MG PO TABS: 5.0000 mg | ORAL_TABLET | Freq: Two times a day (BID) | ORAL | Status: DC

## 2012-12-09 NOTE — Progress Notes (Signed)
Subjective:    Patient ID: Gina [name], female    DOB: 01-01-1951, 62 y.o.   MRN: [id number]  HPI CC: f/u  Gina [name] presents today as 2.5 mo f/u from last office visit.  Since last seen here, had colonoscopy by Dr. Leone Payor: ENDOSCOPIC IMPRESSION:  1. Three diminutive sessile polyps in the descending colon and sigmoid colon; polypectomy performed ==> tubular adenoma x2 2. Mild diverticulosis in the left colon  3. prior colo-colonic surgical anastomosis in the transverse colon   rec rpt colonoscopy in 2 years, and recommended genetic testing for lynch syndrome given personal and fmhx cancers. She did not show up for her genetic eval appointment 12/01/2012.  States she did not know about appointment.  Has not had any recurrent falls since last evaluation here.  Urine incontinence predominantly at night and early PM - initially responded well to ditropan, but has worsened.  No stress incontinence sxs.  Last UA WNL (09/2012).  Denies dysuria or hematuria.  Has received CPAP machine, using nightly.    Decided against shingles shot.  Past Medical History  Diagnosis Date  . Bipolar depression   . DJD (degenerative joint disease), lumbar     chronic lower back pain  . Obesity (BMI 30-39.9)   . History of colon cancer     s/p surgery  . Enterocutaneous fistula 04/07/2012    completed PT 06/2012 {([facility])  . OSA on CPAP     6cm H2O  . RBBB   . Anemia   . Blood transfusion without reported diagnosis 2009  . Cataract     left  . GERD (gastroesophageal reflux disease)   . Hyperlipidemia   . Hypertension   . History of bladder cancer 1997  . History of uterine cancer     s/p hysterectomy  . CRI (chronic renal insufficiency)   . Personal history of colonic adenomas and colon cancer 11/11/2012    Family History  Problem Relation Age of Onset  . Cancer Mother     stomach  . Colon cancer Mother   . Cancer Sister     colon  . Colon cancer Sister   . CAD Father     MI   . Diabetes Other     aunts and uncles both sides  . Arthritis Other     strong fmhx  . Mental illness Sister     anxiety/depression  . Cancer Maternal Grandmother     breast  . Esophageal cancer Neg Hx   . Rectal cancer Neg Hx   . Stomach cancer Neg Hx     Current Outpatient Prescriptions on File Prior to Visit  Medication Sig Dispense Refill  . calcium carbonate (OS-CAL) 600 MG TABS Take 600 mg by mouth 2 (two) times daily with a meal.      . cholecalciferol (VITAMIN D) 400 UNITS TABS Take 400 Units by mouth daily.      . divalproex (DEPAKOTE ER) 500 MG 24 hr tablet Take 1,000 mg by mouth at bedtime.       . feeding supplement (BOOST HIGH PROTEIN) LIQD Take 1 Container by mouth daily.      Marland Kitchen FLUoxetine (PROZAC) 40 MG capsule Take 1 capsule (40 mg total) by mouth daily.  90 capsule  1  . lovastatin (MEVACOR) 40 MG tablet Take 1 tablet (40 mg total) by mouth at bedtime.  90 tablet  3  . nystatin cream (MYCOSTATIN) Apply topically 2 (two) times daily.  30 g  0  .  oxybutynin (DITROPAN) 5 MG tablet Take 1 tablet (5 mg total) by mouth 2 (two) times daily.  60 tablet  0  . pantoprazole (PROTONIX) 40 MG tablet Take 40 mg by mouth daily.      . polyethylene glycol powder (GLYCOLAX/MIRALAX) powder Take 17 g by mouth daily.       No current facility-administered medications on file prior to visit.    Review of Systems Per HPI    Objective:   Physical Exam  Nursing note and vitals reviewed. Constitutional: She appears well-developed and well-nourished. No distress.  obese  Cardiovascular: Normal rate, regular rhythm, normal heart sounds and intact distal pulses.   No murmur heard. Pulmonary/Chest: Effort normal and breath sounds normal. No respiratory distress. She has no wheezes. She has no rales.  Abdominal: Soft. Bowel sounds are normal. She exhibits no distension and no mass. There is no hepatosplenomegaly. There is no tenderness. There is no rigidity, no rebound, no guarding, no  CVA tenderness and negative Murphy's sign.       Assessment & Plan:

## 2012-12-09 NOTE — Assessment & Plan Note (Signed)
In h/o bladder cancer 1997. Surprising that sxs are only at night and early am, no problems during the day.  Decreased ambien did not help. Not quite consistent with urge incontinence, has not responded to anticholinergic.  Not consistent with stress incontinence Will refer to urology for further evaluation, treatment options.

## 2012-12-09 NOTE — Assessment & Plan Note (Addendum)
Did not keep apointment with genetics - states did not know about this. I've asked her to call onc to reschedule appointment for further eval of Lynch Syndrome, provided with # today

## 2012-12-09 NOTE — Patient Instructions (Signed)
Let's refer you to urologist for further evaluation.  Pass by [name]'s office for this. Call oncology to reschedule appointment with genetic doctor.

## 2012-12-27 ENCOUNTER — Other Ambulatory Visit: Payer: Self-pay | Admitting: Family Medicine

## 2012-12-29 ENCOUNTER — Encounter: Payer: Self-pay | Admitting: Family Medicine

## 2013-01-02 DIAGNOSIS — D509 Iron deficiency anemia, unspecified: Secondary | ICD-10-CM

## 2013-01-02 HISTORY — DX: Iron deficiency anemia, unspecified: D50.9

## 2013-01-06 DIAGNOSIS — Z79899 Other long term (current) drug therapy: Secondary | ICD-10-CM | POA: Diagnosis not present

## 2013-01-06 LAB — CBC
HGB: 10.9 g/dL
WBC: 5.8

## 2013-01-06 LAB — COMPREHENSIVE METABOLIC PANEL
Alkaline Phosphatase: 102 U/L
Glucose: 98
Potassium: 5.5 mmol/L
Total Bilirubin: 0.2 mg/dL

## 2013-01-06 LAB — VALPROIC ACID LEVEL: Valproic Acid Lvl: 73

## 2013-01-26 ENCOUNTER — Ambulatory Visit (INDEPENDENT_AMBULATORY_CARE_PROVIDER_SITE_OTHER): Payer: Medicare Other | Admitting: Family Medicine

## 2013-01-26 ENCOUNTER — Encounter: Payer: Self-pay | Admitting: Family Medicine

## 2013-01-26 VITALS — BP 114/70 | HR 72 | Temp 98.3°F | Wt 169.5 lb

## 2013-01-26 DIAGNOSIS — K632 Fistula of intestine: Secondary | ICD-10-CM | POA: Diagnosis not present

## 2013-01-26 DIAGNOSIS — D631 Anemia in chronic kidney disease: Secondary | ICD-10-CM | POA: Insufficient documentation

## 2013-01-26 DIAGNOSIS — F319 Bipolar disorder, unspecified: Secondary | ICD-10-CM | POA: Diagnosis not present

## 2013-01-26 DIAGNOSIS — R82998 Other abnormal findings in urine: Secondary | ICD-10-CM

## 2013-01-26 DIAGNOSIS — R8290 Unspecified abnormal findings in urine: Secondary | ICD-10-CM

## 2013-01-26 DIAGNOSIS — D649 Anemia, unspecified: Secondary | ICD-10-CM | POA: Diagnosis not present

## 2013-01-26 LAB — IBC PANEL: Saturation Ratios: 11.6 % — ABNORMAL LOW (ref 20.0–50.0)

## 2013-01-26 LAB — POCT URINALYSIS DIPSTICK
Glucose, UA: NEGATIVE
Ketones, UA: NEGATIVE
Spec Grav, UA: 1.02
Urobilinogen, UA: 0.2

## 2013-01-26 LAB — VITAMIN B12: Vitamin B-12: 994 pg/mL — ABNORMAL HIGH (ref 211–911)

## 2013-01-26 NOTE — Assessment & Plan Note (Addendum)
Check UA today - overall clear.  Reassured.

## 2013-01-26 NOTE — Progress Notes (Signed)
  Subjective:    Patient ID: Gina [name], female    DOB: [birth year], 62 y.o.   MRN: [id number]  HPI CC: several issues  Protonix was denied by insurance.  Takes this for GERD.  States this was started by psych, but pt unclear why.  Denies indigestion or brash or heartburn.  No h/o EGD.  Told by psychiatrist that iron levels were low.  Reviewed recent blood work by psychiatrist - Hgb 10.9, elevated RDW, Cr elevated at 1.33 Lab Results  Component Value Date   CREATININE 1.3* 08/27/2012  Known mild CRI  Sleeping - increased recently.  Bedtime is 10pm, wakes up at 11am.  Wakes up feeling rested.  Does not easily sleep during the day.  Daytime - trips to swimming pool.  Mood overall stable.  Odor in urine - a few days ago.  No recent UTI.  No dysuria, hematuria, change in frequency.  Stays with urgency.  No new back pain.  Oxybutynin has helped some.  Wt Readings from Last 3 Encounters:  01/26/13 169 lb 8 oz (76.885 kg)  12/09/12 171 lb 8 oz (77.792 kg)  11/08/12 172 lb (78.019 kg)    Past Medical History  Diagnosis Date  . Bipolar depression   . DJD (degenerative joint disease), lumbar     chronic lower back pain  . Obesity (BMI 30-39.9)   . History of colon cancer     s/p surgery  . Enterocutaneous fistula 04/07/2012    completed PT 06/2012 {([facility])  . OSA on CPAP     6cm H2O  . RBBB   . Anemia   . Blood transfusion without reported diagnosis 2009  . Cataract     left  . GERD (gastroesophageal reflux disease)   . Hyperlipidemia   . Hypertension   . History of bladder cancer 1997  . History of uterine cancer     s/p hysterectomy  . CRI (chronic renal insufficiency)   . Personal history of colonic adenomas and colon cancer 11/11/2012     Review of Systems Per HPI     Objective:   Physical Exam  Nursing note and vitals reviewed. Constitutional: She appears well-developed and well-nourished. No distress.  HENT:  Head: Normocephalic and atraumatic.   Mouth/Throat: Oropharynx is clear and moist. No oropharyngeal exudate.  Eyes: Conjunctivae and EOM are normal. Pupils are equal, round, and reactive to light. No scleral icterus.  Abdominal: Soft. Normal appearance and bowel sounds are normal. She exhibits no distension and no mass. There is no hepatosplenomegaly. There is no tenderness. There is no rigidity, no rebound, no guarding, no CVA tenderness and negative Murphy's sign.  Midline incision  Musculoskeletal: She exhibits no edema.       Assessment & Plan:

## 2013-01-26 NOTE — Assessment & Plan Note (Signed)
With elevated RDW, possible IDA - check iron panel today as well as B12 and folate. This could be accounting for her fatigue during the day. Does have reason for IDA (h/o colon cancer and recent polyps removed)

## 2013-01-26 NOTE — Assessment & Plan Note (Deleted)
Stable as of psych blood work early this month.

## 2013-01-26 NOTE — Addendum Note (Signed)
Addended by: Tammi Sou on: 01/26/2013 12:02 PM   Modules accepted: Orders

## 2013-01-26 NOTE — Assessment & Plan Note (Signed)
Stable as of psych blood work early this month. 

## 2013-01-26 NOTE — Patient Instructions (Addendum)
Let's stop protonix and let me know if acid reflux symptoms start.  If it restarts, may start you on over the counter or generic omeprazole Blood work today to check iron levels - as these have not been checked yet. Let's recheck urine today to ensure no infection.

## 2013-01-26 NOTE — Assessment & Plan Note (Signed)
Continue to follow with Dr. Rogers Blocker. Daytime sleepiness and somnolence could be iron related, could be med related.  Advised to f/u with psych re psychotropic med changes.

## 2013-01-28 ENCOUNTER — Other Ambulatory Visit: Payer: Self-pay | Admitting: Family Medicine

## 2013-01-28 ENCOUNTER — Encounter: Payer: Self-pay | Admitting: Family Medicine

## 2013-01-28 MED ORDER — FERROUS SULFATE 325 (65 FE) MG PO TABS: 325.0000 mg | ORAL_TABLET | Freq: Every day | ORAL | Status: DC

## 2013-01-30 ENCOUNTER — Other Ambulatory Visit: Payer: Self-pay | Admitting: Family Medicine

## 2013-01-31 ENCOUNTER — Telehealth: Payer: Self-pay | Admitting: *Deleted

## 2013-01-31 ENCOUNTER — Encounter: Payer: Self-pay | Admitting: Family Medicine

## 2013-01-31 ENCOUNTER — Encounter: Payer: Self-pay | Admitting: *Deleted

## 2013-01-31 NOTE — Telephone Encounter (Signed)
Appeal for pantoprazole in your IN box for completion.

## 2013-02-03 NOTE — Telephone Encounter (Signed)
Will hold PPI for now as patient denied GERD sxs at recent follow up

## 2013-02-07 ENCOUNTER — Other Ambulatory Visit: Payer: Self-pay

## 2013-02-07 MED ORDER — OXYBUTYNIN CHLORIDE 5 MG PO TABS: 5.0000 mg | ORAL_TABLET | Freq: Four times a day (QID) | ORAL | Status: DC | PRN

## 2013-02-07 NOTE — Telephone Encounter (Signed)
Pt left v/m requesting refill oxybutin 5 mg to [facility]; pt said she used to take one tab twice a day; now pt takes one tab in AM and two tabs at night. Pt has 2 days of med left. There are no instructions on pts med list. What instructions do you want sent to [facility].Please advise.

## 2013-02-07 NOTE — Telephone Encounter (Signed)
Sent in.  plz notify I want pt to only take 1 pill at a time, but may take up to 4 times daily.

## 2013-02-08 NOTE — Telephone Encounter (Signed)
Patient notified as instructed by telephone. 

## 2013-02-09 ENCOUNTER — Telehealth: Payer: Self-pay

## 2013-02-09 NOTE — Telephone Encounter (Signed)
Shapale notified patient when she was talking with her sister about another matter.

## 2013-02-09 NOTE — Telephone Encounter (Signed)
Recommend take both daily.

## 2013-02-09 NOTE — Telephone Encounter (Signed)
Pt wanted to know if OK to take Vit D 400 U one a day and Ferrous sulfate 325 mg one daily. Advised pt I thought it should be OK to take both meds. Pt wants Dr Bosie Clos opinion. Pt said tomorrow would be OK for cb.

## 2013-03-07 DIAGNOSIS — F39 Unspecified mood [affective] disorder: Secondary | ICD-10-CM | POA: Diagnosis not present

## 2013-03-21 ENCOUNTER — Other Ambulatory Visit: Payer: Self-pay | Admitting: Family Medicine

## 2013-03-23 DIAGNOSIS — F39 Unspecified mood [affective] disorder: Secondary | ICD-10-CM | POA: Diagnosis not present

## 2013-04-05 ENCOUNTER — Ambulatory Visit (INDEPENDENT_AMBULATORY_CARE_PROVIDER_SITE_OTHER): Payer: Medicare Other | Admitting: Family Medicine

## 2013-04-05 ENCOUNTER — Encounter: Payer: Self-pay | Admitting: Family Medicine

## 2013-04-05 VITALS — BP 126/64 | HR 52 | Temp 98.4°F | Wt 170.5 lb

## 2013-04-05 DIAGNOSIS — I10 Essential (primary) hypertension: Secondary | ICD-10-CM | POA: Diagnosis not present

## 2013-04-05 DIAGNOSIS — E669 Obesity, unspecified: Secondary | ICD-10-CM

## 2013-04-05 DIAGNOSIS — G4733 Obstructive sleep apnea (adult) (pediatric): Secondary | ICD-10-CM | POA: Diagnosis not present

## 2013-04-05 DIAGNOSIS — F319 Bipolar disorder, unspecified: Secondary | ICD-10-CM

## 2013-04-05 DIAGNOSIS — D509 Iron deficiency anemia, unspecified: Secondary | ICD-10-CM | POA: Diagnosis not present

## 2013-04-05 DIAGNOSIS — N189 Chronic kidney disease, unspecified: Secondary | ICD-10-CM | POA: Diagnosis not present

## 2013-04-05 DIAGNOSIS — Z23 Encounter for immunization: Secondary | ICD-10-CM

## 2013-04-05 NOTE — Assessment & Plan Note (Signed)
Continue CPAP machine.  May need referral to local pulm to establish care as no records of [place] sleep study.  Pt aware.

## 2013-04-05 NOTE — Assessment & Plan Note (Signed)
Emphasized importance of regular activity and exercise.  Pt states she thinks she would be able to walk outside regularly with sister.

## 2013-04-05 NOTE — Assessment & Plan Note (Signed)
BP Readings from Last 3 Encounters:  04/05/13 126/64  01/26/13 114/70  12/09/12 116/80  no recent issue with hypertension - will remove from problem list.

## 2013-04-05 NOTE — Assessment & Plan Note (Signed)
Stable on current regimen - continue med.

## 2013-04-05 NOTE — Addendum Note (Signed)
Addended by: Josph Macho A on: 04/05/2013 04:03 PM   Modules accepted: Orders

## 2013-04-05 NOTE — Progress Notes (Signed)
  Subjective:    Patient ID: Gina [name], female    DOB: 1951-05-08, 62 y.o.   MRN: [id number]  HPI CC: 3 mo f/u  Bipolar - Last saw Dr. Rogers Blocker psychiatrist 1 month ago.  Feels bipolar stable on current regimen.  Urge incontinence - taking oxybutynin 5mg  tid - feels this helps control sxs well.  CKD - latest Cr 1.99.  Staying hydrated.  Avoids NSAIDs.  OSA - on CPAP.  May need to get another machine authorized.  Last sleep study was 5 yrs ago in [place].  We don't have records of this sleep study.  BP Readings from Last 3 Encounters:  04/05/13 126/64  01/26/13 114/70  12/09/12 116/80   Wt Readings from Last 3 Encounters:  04/05/13 170 lb 8 oz (77.338 kg)  01/26/13 169 lb 8 oz (76.885 kg)  12/09/12 171 lb 8 oz (77.792 kg)  Body mass index is 31.18 kg/(m^2).   Past Medical History  Diagnosis Date  . Bipolar depression   . DJD (degenerative joint disease), lumbar     chronic lower back pain  . Obesity (BMI 30-39.9)   . History of colon cancer     s/p surgery  . Enterocutaneous fistula 04/07/2012    completed PT 06/2012 {([facility])  . OSA on CPAP     6cm H2O  . RBBB   . Anemia   . Blood transfusion without reported diagnosis 2009  . Cataract     left  . GERD (gastroesophageal reflux disease)   . Hyperlipidemia   . Hypertension   . History of bladder cancer 1997  . History of uterine cancer     s/p hysterectomy  . CRI (chronic renal insufficiency)   . Personal history of colonic adenomas and colon cancer 11/11/2012  . IDA (iron deficiency anemia) 01/2013    thought due to h/o polyps    Review of Systems Per HPI    Objective:   Physical Exam  Nursing note and vitals reviewed. Constitutional: She appears well-developed and well-nourished. No distress.  HENT:  Mouth/Throat: Oropharynx is clear and moist. No oropharyngeal exudate.  Eyes: Conjunctivae and EOM are normal. Pupils are equal, round, and reactive to light.  Neck: Normal range of motion. Neck  supple. Carotid bruit is not present.  Cardiovascular: Normal rate, regular rhythm, normal heart sounds and intact distal pulses.   No murmur heard. Pulmonary/Chest: Effort normal and breath sounds normal. No respiratory distress. She has no wheezes. She has no rales.  Abdominal: Soft. Bowel sounds are normal. She exhibits no distension and no mass. There is no tenderness. There is no rebound and no guarding.  Scarring on abdomen from prior surgeries  Musculoskeletal: She exhibits no edema.  Skin: Skin is warm and dry. No rash noted.       Assessment & Plan:

## 2013-04-05 NOTE — Assessment & Plan Note (Signed)
?  latest Cr 1.99? Recheck today.  Avoids NSAIDs, stays well hydrated.

## 2013-04-05 NOTE — Patient Instructions (Addendum)
Flu shot for you and Gina [name] today. Blood work today. Return in 6 months for medicare wellness visit.

## 2013-04-06 LAB — CBC WITH DIFFERENTIAL/PLATELET
Eosinophils Relative: 1.9 % (ref 0.0–5.0)
HCT: 32.7 % — ABNORMAL LOW (ref 36.0–46.0)
Lymphs Abs: 1.7 10*3/uL (ref 0.7–4.0)
MCV: 86.1 fl (ref 78.0–100.0)
Monocytes Absolute: 0.7 10*3/uL (ref 0.1–1.0)
Platelets: 175 10*3/uL (ref 150.0–400.0)
RDW: 15.3 % — ABNORMAL HIGH (ref 11.5–14.6)
WBC: 4.9 10*3/uL (ref 4.5–10.5)

## 2013-04-06 LAB — COMPREHENSIVE METABOLIC PANEL
ALT: 24 U/L (ref 0–35)
Albumin: 3.3 g/dL — ABNORMAL LOW (ref 3.5–5.2)
CO2: 30 mEq/L (ref 19–32)
Calcium: 9.4 mg/dL (ref 8.4–10.5)
Chloride: 101 mEq/L (ref 96–112)
GFR: 44.79 mL/min — ABNORMAL LOW (ref >60.00)
Potassium: 4.8 mEq/L (ref 3.5–5.1)
Sodium: 136 mEq/L (ref 135–145)
Total Protein: 7.1 g/dL (ref 6.0–8.3)

## 2013-04-07 ENCOUNTER — Encounter: Payer: Self-pay | Admitting: *Deleted

## 2013-04-08 ENCOUNTER — Telehealth: Payer: Self-pay

## 2013-04-08 ENCOUNTER — Emergency Department (HOSPITAL_COMMUNITY): Payer: Medicare Other

## 2013-04-08 ENCOUNTER — Encounter (HOSPITAL_COMMUNITY): Payer: Self-pay | Admitting: *Deleted

## 2013-04-08 ENCOUNTER — Telehealth: Payer: Self-pay | Admitting: Family Medicine

## 2013-04-08 ENCOUNTER — Emergency Department (HOSPITAL_COMMUNITY)
Admission: EM | Admit: 2013-04-08 | Discharge: 2013-04-08 | Disposition: A | Discharge status: Home / Self Care | Payer: Medicare Other | Attending: Emergency Medicine | Admitting: Emergency Medicine

## 2013-04-08 DIAGNOSIS — G4733 Obstructive sleep apnea (adult) (pediatric): Secondary | ICD-10-CM | POA: Insufficient documentation

## 2013-04-08 DIAGNOSIS — Z8659 Personal history of other mental and behavioral disorders: Secondary | ICD-10-CM | POA: Insufficient documentation

## 2013-04-08 DIAGNOSIS — Z8542 Personal history of malignant neoplasm of other parts of uterus: Secondary | ICD-10-CM | POA: Diagnosis not present

## 2013-04-08 DIAGNOSIS — E669 Obesity, unspecified: Secondary | ICD-10-CM | POA: Diagnosis not present

## 2013-04-08 DIAGNOSIS — E785 Hyperlipidemia, unspecified: Secondary | ICD-10-CM | POA: Diagnosis not present

## 2013-04-08 DIAGNOSIS — Z862 Personal history of diseases of the blood and blood-forming organs and certain disorders involving the immune mechanism: Secondary | ICD-10-CM | POA: Insufficient documentation

## 2013-04-08 DIAGNOSIS — Z8551 Personal history of malignant neoplasm of bladder: Secondary | ICD-10-CM | POA: Diagnosis not present

## 2013-04-08 DIAGNOSIS — Z90710 Acquired absence of both cervix and uterus: Secondary | ICD-10-CM | POA: Insufficient documentation

## 2013-04-08 DIAGNOSIS — Z85038 Personal history of other malignant neoplasm of large intestine: Secondary | ICD-10-CM | POA: Insufficient documentation

## 2013-04-08 DIAGNOSIS — Z880 Allergy status to penicillin: Secondary | ICD-10-CM | POA: Insufficient documentation

## 2013-04-08 DIAGNOSIS — M25569 Pain in unspecified knee: Secondary | ICD-10-CM | POA: Diagnosis not present

## 2013-04-08 DIAGNOSIS — Y9389 Activity, other specified: Secondary | ICD-10-CM | POA: Insufficient documentation

## 2013-04-08 DIAGNOSIS — S0990XA Unspecified injury of head, initial encounter: Secondary | ICD-10-CM | POA: Diagnosis not present

## 2013-04-08 DIAGNOSIS — Z8739 Personal history of other diseases of the musculoskeletal system and connective tissue: Secondary | ICD-10-CM | POA: Insufficient documentation

## 2013-04-08 DIAGNOSIS — T1490XA Injury, unspecified, initial encounter: Secondary | ICD-10-CM | POA: Diagnosis not present

## 2013-04-08 DIAGNOSIS — S3981XA Other specified injuries of abdomen, initial encounter: Secondary | ICD-10-CM | POA: Insufficient documentation

## 2013-04-08 DIAGNOSIS — Z9889 Other specified postprocedural states: Secondary | ICD-10-CM | POA: Diagnosis not present

## 2013-04-08 DIAGNOSIS — W010XXA Fall on same level from slipping, tripping and stumbling without subsequent striking against object, initial encounter: Secondary | ICD-10-CM | POA: Insufficient documentation

## 2013-04-08 DIAGNOSIS — R1032 Left lower quadrant pain: Secondary | ICD-10-CM | POA: Diagnosis not present

## 2013-04-08 DIAGNOSIS — Z79899 Other long term (current) drug therapy: Secondary | ICD-10-CM | POA: Insufficient documentation

## 2013-04-08 DIAGNOSIS — Z9079 Acquired absence of other genital organ(s): Secondary | ICD-10-CM | POA: Diagnosis not present

## 2013-04-08 DIAGNOSIS — R1012 Left upper quadrant pain: Secondary | ICD-10-CM | POA: Diagnosis not present

## 2013-04-08 DIAGNOSIS — I129 Hypertensive chronic kidney disease with stage 1 through stage 4 chronic kidney disease, or unspecified chronic kidney disease: Secondary | ICD-10-CM | POA: Diagnosis not present

## 2013-04-08 DIAGNOSIS — Z8719 Personal history of other diseases of the digestive system: Secondary | ICD-10-CM | POA: Diagnosis not present

## 2013-04-08 DIAGNOSIS — N189 Chronic kidney disease, unspecified: Secondary | ICD-10-CM | POA: Diagnosis not present

## 2013-04-08 DIAGNOSIS — S0003XA Contusion of scalp, initial encounter: Secondary | ICD-10-CM | POA: Diagnosis not present

## 2013-04-08 DIAGNOSIS — S8990XA Unspecified injury of unspecified lower leg, initial encounter: Secondary | ICD-10-CM | POA: Diagnosis not present

## 2013-04-08 DIAGNOSIS — W19XXXA Unspecified fall, initial encounter: Secondary | ICD-10-CM

## 2013-04-08 DIAGNOSIS — Y92009 Unspecified place in unspecified non-institutional (private) residence as the place of occurrence of the external cause: Secondary | ICD-10-CM | POA: Insufficient documentation

## 2013-04-08 NOTE — ED Provider Notes (Signed)
CSN: [id number]     Arrival date & time 04/08/13  1032 History   First MD Initiated Contact with Patient 04/08/13 1040     Chief Complaint  Patient presents with  . Fall   (Consider location/radiation/quality/duration/timing/severity/associated sxs/prior Treatment) HPI Comments: Tripped over part of the sidewalk  Patient is a 62 y.o. female presenting with fall. The history is provided by the patient.  Fall This is a new problem. The current episode started less than 1 hour ago. Episode frequency: once. The problem has not changed since onset.Associated symptoms include abdominal pain (now after the fall on the L, none prior). Pertinent negatives include no chest pain, no headaches and no shortness of breath. Nothing aggravates the symptoms. Nothing relieves the symptoms. She has tried nothing for the symptoms.    Past Medical History  Diagnosis Date  . Bipolar depression   . DJD (degenerative joint disease), lumbar     chronic lower back pain  . Obesity (BMI 30-39.9)   . History of colon cancer     s/p surgery  . Enterocutaneous fistula 04/07/2012    completed PT 06/2012 {([facility])  . OSA on CPAP     6cm H2O  . RBBB   . Anemia   . Blood transfusion without reported diagnosis 2009  . Cataract     left  . GERD (gastroesophageal reflux disease)   . Hyperlipidemia   . Hypertension   . History of bladder cancer 1997  . History of uterine cancer     s/p hysterectomy  . CRI (chronic renal insufficiency)   . Personal history of colonic adenomas and colon cancer 11/11/2012  . IDA (iron deficiency anemia) 01/2013    thought due to h/o polyps   Past Surgical History  Procedure Laterality Date  . Hernia repair  02/04/12  . Partial colectomy  about 2008    for colon cancer  . I & d abdominal wound  02/19/12  . Removal of infected mesh and abdominal wound vac placement  02/24/12  . Reexploration of abdominal wound and allograft placemet  02/26/12  . Partial hysterectomy  1981    uterine  cancer  . Left oophorectomy  2005  . Colonoscopy  07/2009  . Dexa  12/2009    WNL  . Dobutamine stress echo  12/2009    no evidence of ischemia  . Colonoscopy  11/2012    2 tubular adenomas, mild diverticulosis, pending genetic testing for Lynch syndrome Leone Payor) rpt 2 yrs   Family History  Problem Relation Age of Onset  . Cancer Mother     stomach  . Colon cancer Mother   . Cancer Sister     colon  . Colon cancer Sister   . CAD Father     MI  . Diabetes Other     aunts and uncles both sides  . Arthritis Other     strong fmhx  . Mental illness Sister     anxiety/depression  . Cancer Maternal Grandmother     breast  . Esophageal cancer Neg Hx   . Rectal cancer Neg Hx   . Stomach cancer Neg Hx    History  Substance Use Topics  . Smoking status: Never Smoker   . Smokeless tobacco: Never Used  . Alcohol Use: No   OB History   Grav Para Term Preterm Abortions TAB SAB Ect Mult Living                 Review of Systems  Constitutional: Negative for fever.  Respiratory: Negative for cough and shortness of breath.   Cardiovascular: Negative for chest pain and leg swelling.  Gastrointestinal: Positive for abdominal pain (now after the fall on the L, none prior).  Neurological: Negative for headaches.  All other systems reviewed and are negative.    Allergies  Penicillins; Ivp dye; Tetanus toxoids; and Zetia  Home Medications   Current Outpatient Rx  Name  Route  Sig  Dispense  Refill  . cholecalciferol (VITAMIN D) 400 UNITS TABS   Oral   Take 400 Units by mouth daily.         . divalproex (DEPAKOTE ER) 500 MG 24 hr tablet   Oral   Take 1,000 mg by mouth at bedtime.          . docusate sodium (COLACE) 100 MG capsule   Oral   Take 100 mg by mouth at bedtime.          . ferrous sulfate 325 (65 FE) MG tablet   Oral   Take 1 tablet (325 mg total) by mouth daily with breakfast.   90 tablet   3   . FLUoxetine (PROZAC) 40 MG capsule   Oral   Take 1  capsule (40 mg total) by mouth daily.   90 capsule   1   . lovastatin (MEVACOR) 40 MG tablet   Oral   Take 1 tablet (40 mg total) by mouth at bedtime.   90 tablet   3   . oxybutynin (DITROPAN) 5 MG tablet   Oral   Take 5 mg by mouth 4 (four) times daily as needed. Bladder control          BP 108/66  Pulse 56  Temp(Src) 98.2 F (36.8 C) (Oral)  Resp 18  SpO2 94% Physical Exam  Nursing note and vitals reviewed. Constitutional: She is oriented to person, place, and time. She appears well-developed and well-nourished. No distress.  HENT:  Head: Normocephalic and atraumatic.    Eyes: EOM are normal. Pupils are equal, round, and reactive to light.  Neck: Normal range of motion. Neck supple.  Cardiovascular: Normal rate and regular rhythm.  Exam reveals no friction rub.   No murmur heard. Pulmonary/Chest: Effort normal and breath sounds normal. No respiratory distress. She has no wheezes. She has no rales.  Abdominal: Soft. She exhibits no distension. There is tenderness (mild) in the left upper quadrant and left lower quadrant. There is no rigidity, no rebound, no guarding, no tenderness at McBurney's point and negative Murphy's sign.  Musculoskeletal: Normal range of motion. She exhibits no edema.       Left knee: She exhibits swelling (mild). She exhibits normal range of motion. Tenderness (mild, diffuse) found.  Neurological: She is alert and oriented to person, place, and time.  Skin: She is not diaphoretic.    ED Course  Procedures (including critical care time) Labs Review Labs Reviewed - No data to display Imaging Review Ct Abdomen Pelvis Wo Contrast  04/08/2013   *RADIOLOGY REPORT*  Clinical Data: Fall.  CT ABDOMEN AND PELVIS WITHOUT CONTRAST  Technique:  Multidetector CT imaging of the abdomen and pelvis was performed following the standard protocol without intravenous contrast.  Comparison: 04/21/2012  Findings: No focal abnormalities seen in the liver or spleen on  this study performed without intravenous contrast material.  The stomach, duodenum, pancreas, gallbladder, and adrenal glands are unremarkable.  No abnormalities seen in the right kidney.  13 mm exophytic lesion from the  lower pole of the left kidney is stable, and compatible with a tiny cyst.  No abdominal aortic aneurysm.  There is no free fluid or lymphadenopathy in the abdomen.  Midline ventral fascial laxity results in an dye stasis of the rectus sheath.  Imaging through the pelvis shows no free intraperitoneal fluid.  No pelvic sidewall lymphadenopathy.  Bladder is unremarkable.  Uterus is surgically absent.  There is no adnexal mass.  Anastomotic suture line noted in the mid transverse colon.  The terminal ileum is normal.  The appendix is not well visualized, but no edema or inflammation is seen in the right lower quadrant.  Bone windows reveal no worrisome lytic or sclerotic osseous lesions.  IMPRESSION: No acute traumatic organ injury in the abdomen or pelvis on this exam performed without intravenous contrast material.  Of note, assessment for solid organ injury is decreased in the absence of intravenous contrast material.   Original Report Authenticated By: Kennith Center, M.D.   Dg Knee 2 Views Left  04/08/2013   CLINICAL DATA:  Fall, knee injury, pain.  EXAM: LEFT KNEE - 1-2 VIEW  COMPARISON:  None.  FINDINGS: There is a small joint effusion. Advanced tricompartment degenerative changes with joint space loss and exuberant osteophyte formation. No acute bony abnormality. Specifically, no fracture, subluxation, or dislocation. Soft tissues are intact.  IMPRESSION: Advanced degenerative changes with small joint effusion. No acute bony abnormality.   Electronically Signed   By: Charlett Nose M.D.   On: 04/08/2013 12:05   Ct Head Wo Contrast  04/08/2013   *RADIOLOGY REPORT*  Clinical Data: Fall  CT HEAD WITHOUT CONTRAST  Technique:  Contiguous axial images were obtained from the base of the skull through  the vertex without contrast.  Comparison: CT 04/07/2012  Findings: Generalized atrophy.  Negative for acute infarct. Negative for hemorrhage or mass lesion.  Mild calcification in the cerebellum bilaterally is unchanged from the  prior study. Negative for skull fracture.  IMPRESSION: No acute intracranial abnormality.  No change from prior CT.   Original Report Authenticated By: Janeece Riggers, M.D.    MDM   1. Fall, initial encounter      49F with mechanical fall walking into the house today. No hx of anticoagulation. Tripped over step - hit head and L side of abdomen. No LOC, has been ambulatory since the event. Here, has abrasion on L forehead, no midline neck pain, no other back pain. Mild L sided abdominal pain, pelvis stable. Mild L knee pain with normal ROM. Will update tetanus and scan head and abdomen. Cannot use contrast since she is allergic to IV dye. CTs normal. Stable for discharge.     Dagmar Hait, MD 04/08/13 [phone]

## 2013-04-08 NOTE — Telephone Encounter (Signed)
Noted. plz call this afternoon or Monday for f/u.

## 2013-04-08 NOTE — Telephone Encounter (Signed)
Patient Information:  Caller Name: Makenzye  Phone: [phone]  Patient: Gina [name], Gina [name]  Gender: Female  DOB: May 19, 1951  Age: 62 Years  PCP: [facility])  Office Follow Up:  Does the office need to follow up with this patient?: No  Instructions For The Office: N/A   Symptoms  Reason For Call & Symptoms: [place] onto concrete when she tripped on a small step this morning at 0600-04/08/13. Bumped  L side of body: L knee, L side of forehead and L side of head behind her ear. L knee has abrasion and is swollen and it hurts to walk and she is c/o whole body soreness. Sister/Marie thinks she is not taking medication correctly-she is having spells when she spacing out and laughing for no reason, and having paranoid thoughts and mood swings. Sister  cannot read and cannot help her with med administration. Neighbor came over and helped her up. She  is having trouble walking d/t pain in knee. She is nauseous but no vomiting. Bump is 2 inches in diameter on forehead.  Reviewed Health History In EMR: Yes  Reviewed Medications In EMR: Yes  Reviewed Allergies In EMR: Yes  Reviewed Surgeries / Procedures: Yes  Date of Onset of Symptoms: 04/08/2013  Treatments Tried: Ice packs to knee, forehead  Treatments Tried Worked: No  Guideline(s) Used:  Head Injury  Disposition Per Guideline:   Go to ED Now (or to Office with PCP Approval)  Reason For Disposition Reached:   Knocked out (unconscious) < 1 minute and now fine  Advice Given:  Reassurance - Direct Blow (Contusion, Bruise)  This sounds like a scalp injury rather than a brain injury or concussion. Treatment at home should be safe. A direct blow to your scalp can cause a contusion. Contusion is the medical term for bruise.  Symptoms are mild pain, swelling, and/or bruising. Sometimes there can also be mild dizziness and nausea.  Apply a Cold Pack:  Continue this for the first 48 hours after an injury.  This will help  decrease pain and swelling.  Observation After Head Injury:  Watch a person with a head injury very closely during the first 2 hours following the injury.  Expected Course:  Most head trauma only causes an injury to the scalp. Pain, swelling, and bruising usually start to get better 2 to 3 days after an injury.  Swelling most often is gone after 1 week.  Diet:   Clear fluids to drink at first, in case of vomiting. May resume a regular diet after 2 hours.  Call Back If:  Severe headache  Extremity weakness or numbness occurs  Slurred speech or blurred vision occurs  Vomiting occurs  You become worse.  Patient Refused Recommendation:  Patient Will Go To ED  Advised 911 since does not have transportation.

## 2013-04-08 NOTE — ED Notes (Signed)
Patient states she tripped and fell this am on step going into house, patient states she fell on left side with left head contusion, left shoulder pain, left knee pain, patient with +PMS in all extremities

## 2013-04-08 NOTE — ED Notes (Signed)
Pt discharged.Vital signs stable and GCS 15.Pt wheeled to the waiting room,warm blanket,meal given.

## 2013-04-08 NOTE — Telephone Encounter (Signed)
Gina [name] with [facility]; pt fell this AM has knot on forehead and lt side of head, injury to knee (knee swollen and hurts to bear weight on knee). Pt said she lost consciousness but pts sister said pt did not get knocked out). Pts sister said she does not think pt taking meds appropriately, pt has been acting "spacey" laughs inappropriately and thinks someone has broken in to home but no one has broken in according to pts sister. Dr Reece Agar advised ED for eval. Gina [name] will call ambulance for pt. Gina [name] also suggest possible home health referral for pt to have assistance with meds.

## 2013-04-12 NOTE — Telephone Encounter (Signed)
Late entry-message left yesterday for patient to return my call with update.  Message left today for patient to return my call with update.

## 2013-04-14 ENCOUNTER — Ambulatory Visit: Payer: Medicare Other | Admitting: Family Medicine

## 2013-04-14 NOTE — Telephone Encounter (Signed)
Noted. Will discuss at ER f/u office visit tomorrow.

## 2013-04-14 NOTE — Telephone Encounter (Signed)
Patient has follow up scheduled for tomorrow.

## 2013-04-15 ENCOUNTER — Ambulatory Visit (INDEPENDENT_AMBULATORY_CARE_PROVIDER_SITE_OTHER): Payer: Medicare Other | Admitting: Family Medicine

## 2013-04-15 ENCOUNTER — Encounter: Payer: Self-pay | Admitting: Family Medicine

## 2013-04-15 VITALS — BP 118/76 | HR 72 | Temp 98.5°F | Wt 169.5 lb

## 2013-04-15 DIAGNOSIS — Z9181 History of falling: Secondary | ICD-10-CM | POA: Diagnosis not present

## 2013-04-15 DIAGNOSIS — F319 Bipolar disorder, unspecified: Secondary | ICD-10-CM

## 2013-04-15 DIAGNOSIS — R296 Repeated falls: Secondary | ICD-10-CM

## 2013-04-15 NOTE — Assessment & Plan Note (Signed)
Hospital records reviewed.  Workup overall normal.  Mechanical fall without premonitory sxs - associated with slipper usage - pt aware to wear closed fitting shoes when going outside. Pt declines physical therapy referral or home safety assessement. Doubt oxybutynin contributing.  If worsening imbalance, advised return for further eval or consider HHPT for fall prevention program.  Pt declines for now

## 2013-04-15 NOTE — Patient Instructions (Signed)
Good to see you today, call us with questions. If any more dizziness or worsening imbalance please return to see me. Return as needed or at previously scheduled appointment.

## 2013-04-15 NOTE — Assessment & Plan Note (Signed)
Per patient, endorses taking meds as prescribed.   Declines nursing aide to come out to house for med administration assistance. Will continue to monitor.

## 2013-04-15 NOTE — Progress Notes (Signed)
  Subjective:    Patient ID: Gina [name], female    DOB: August 09, 1950, 62 y.o.   MRN: [id number]  HPI CC: f/u ER visit after fall  DOI:04/08/2013  Larey Seat after tripped over sidewalk at home. Hit L lateral head, L forehead, and L knee.  Also endorsed L sided abd pain after fall. ?LOC - pt and sister deny. Occasional headaches since fall.  Denies vision changes. Evaluated at [facility] ER - stable head CT and noncontrasted abd CT (allergy to IV dye) and knee xray.   ER note mentions tetanus updated, but no records of this in our chart.  Pt denies having received tetanus shot. Mechanical fall wearing slipper - denies premonitory sxs like dizziness, lightheadedness, presyncope, palpitations.  Also sister, Hilda Lias, is concerned that pt is not taking psych meds as prescribed - sister endorses inappropriate laughter, acting "spacey", and some delusions of home robbery.  Pt denies this "I know how to take my meds"  Past Medical History  Diagnosis Date  . Bipolar depression   . DJD (degenerative joint disease), lumbar     chronic lower back pain  . Obesity (BMI 30-39.9)   . History of colon cancer     s/p surgery  . Enterocutaneous fistula 04/07/2012    completed PT 06/2012 [facility])  . OSA on CPAP     6cm H2O  . RBBB   . Anemia   . Blood transfusion without reported diagnosis 2009  . Cataract     left  . GERD (gastroesophageal reflux disease)   . Hyperlipidemia   . Hypertension   . History of bladder cancer 1997  . History of uterine cancer     s/p hysterectomy  . CRI (chronic renal insufficiency)   . Personal history of colonic adenomas and colon cancer 11/11/2012  . IDA (iron deficiency anemia) 01/2013    thought due to h/o polyps    Review of Systems Per HPI    Objective:   Physical Exam  Nursing note and vitals reviewed. Constitutional: She appears well-developed and well-nourished. No distress.  HENT:  Head: Normocephalic and atraumatic.  Right Ear: Tympanic membrane,  external ear and ear canal normal.  Left Ear: Tympanic membrane, external ear and ear canal normal.  Mouth/Throat: Oropharynx is clear and moist. No oropharyngeal exudate.  Eyes: Conjunctivae and EOM are normal. Pupils are equal, round, and reactive to light. No scleral icterus.  Neck: Normal range of motion. Neck supple.  Cardiovascular: Normal rate, regular rhythm, normal heart sounds and intact distal pulses.   No murmur heard. Pulmonary/Chest: Effort normal and breath sounds normal. No respiratory distress. She has no wheezes. She has no rales.  Neurological: She is alert. She has normal strength. No cranial nerve deficit or sensory deficit. She displays a negative Romberg sign. Coordination normal.  Slowed gait CN 2-12 intact normal FTN No pronator drift       Assessment & Plan:

## 2013-04-20 ENCOUNTER — Other Ambulatory Visit: Payer: Self-pay | Admitting: *Deleted

## 2013-04-20 MED ORDER — OXYBUTYNIN CHLORIDE 5 MG PO TABS: 5.0000 mg | ORAL_TABLET | Freq: Four times a day (QID) | ORAL | Status: DC | PRN

## 2013-05-18 DIAGNOSIS — F39 Unspecified mood [affective] disorder: Secondary | ICD-10-CM | POA: Diagnosis not present

## 2013-05-27 ENCOUNTER — Telehealth: Payer: Self-pay

## 2013-05-27 NOTE — Telephone Encounter (Signed)
Pt left v/m requesting cb; spoke with pt and she was having problems with cpap machine but spoke with c pap co and thinks problem resolved.

## 2013-07-13 DIAGNOSIS — F331 Major depressive disorder, recurrent, moderate: Secondary | ICD-10-CM | POA: Diagnosis not present

## 2013-08-18 ENCOUNTER — Other Ambulatory Visit: Payer: Self-pay | Admitting: Family Medicine

## 2013-08-22 ENCOUNTER — Telehealth: Payer: Self-pay

## 2013-08-22 NOTE — Telephone Encounter (Signed)
Pt left v/m that she cannot read and comprehend what she has read; pt request counseling for reading comprehension.Please advise.

## 2013-08-25 NOTE — Telephone Encounter (Signed)
I don't know of any counseling available for this.   Would recommend she call 411 - and ask about local reading comprehension classes? Let us know if nothing is found.

## 2013-08-26 NOTE — Telephone Encounter (Signed)
Ok to do thanks. 

## 2013-08-26 NOTE — Telephone Encounter (Signed)
I am familiar with a program in [place] [employer]. Is it okay to refer her there? It is designed to help illiterate adults learn to read and those needing to learn English as a second language.

## 2013-08-26 NOTE — Telephone Encounter (Signed)
Message left advising patient about [facility]. Mailed her the contact info since she will have to call and schedule herself for an appointment.

## 2013-09-07 ENCOUNTER — Telehealth: Payer: Self-pay

## 2013-09-07 DIAGNOSIS — F331 Major depressive disorder, recurrent, moderate: Secondary | ICD-10-CM | POA: Diagnosis not present

## 2013-09-07 DIAGNOSIS — F319 Bipolar disorder, unspecified: Secondary | ICD-10-CM

## 2013-09-07 NOTE — Telephone Encounter (Signed)
Referral placed and routed to [facility].

## 2013-09-07 NOTE — Telephone Encounter (Signed)
Pt said psychiatrist she has been seeing no longer takes medicare and pt request referral to [facility] phone [phone] and fax # [phone]. Pt said she has same medicare card. Pt request [facility] to [facility].Please advise.

## 2013-09-09 NOTE — Telephone Encounter (Signed)
New request faxed to [facility] for New pt appt, waiting for appt to be made.

## 2013-09-10 ENCOUNTER — Other Ambulatory Visit: Payer: Self-pay | Admitting: Family Medicine

## 2013-09-10 DIAGNOSIS — Z0000 Encounter for general adult medical examination without abnormal findings: Secondary | ICD-10-CM

## 2013-09-10 DIAGNOSIS — I10 Essential (primary) hypertension: Secondary | ICD-10-CM

## 2013-09-10 DIAGNOSIS — Z5181 Encounter for therapeutic drug level monitoring: Secondary | ICD-10-CM

## 2013-09-10 DIAGNOSIS — N189 Chronic kidney disease, unspecified: Secondary | ICD-10-CM

## 2013-09-10 DIAGNOSIS — E785 Hyperlipidemia, unspecified: Secondary | ICD-10-CM

## 2013-09-10 DIAGNOSIS — D509 Iron deficiency anemia, unspecified: Secondary | ICD-10-CM

## 2013-09-13 ENCOUNTER — Other Ambulatory Visit (INDEPENDENT_AMBULATORY_CARE_PROVIDER_SITE_OTHER): Payer: Medicare Other

## 2013-09-13 DIAGNOSIS — Z5181 Encounter for therapeutic drug level monitoring: Secondary | ICD-10-CM

## 2013-09-13 DIAGNOSIS — N189 Chronic kidney disease, unspecified: Secondary | ICD-10-CM

## 2013-09-13 DIAGNOSIS — D509 Iron deficiency anemia, unspecified: Secondary | ICD-10-CM | POA: Diagnosis not present

## 2013-09-13 LAB — RENAL FUNCTION PANEL
Albumin: 3.3 g/dL — ABNORMAL LOW (ref 3.5–5.2)
BUN: 25 mg/dL — ABNORMAL HIGH (ref 6–23)
CHLORIDE: 106 meq/L (ref 96–112)
CO2: 29 mEq/L (ref 19–32)
Calcium: 9.2 mg/dL (ref 8.4–10.5)
Creatinine, Ser: 1.2 mg/dL (ref 0.4–1.2)
GFR: 47.73 mL/min — AB (ref >60.00)
Glucose, Bld: 91 mg/dL (ref 70–99)
PHOSPHORUS: 3.4 mg/dL (ref 2.3–4.6)
POTASSIUM: 5.1 meq/L (ref 3.5–5.1)
SODIUM: 142 meq/L (ref 135–145)

## 2013-09-13 LAB — CBC WITH DIFFERENTIAL/PLATELET
BASOS ABS: 0 10*3/uL (ref 0.0–0.1)
Basophils Relative: 0.4 % (ref 0.0–3.0)
EOS ABS: 0.1 10*3/uL (ref 0.0–0.7)
Eosinophils Relative: 1.8 % (ref 0.0–5.0)
HCT: 34 % — ABNORMAL LOW (ref 36.0–46.0)
Hemoglobin: 11.3 g/dL — ABNORMAL LOW (ref 12.0–15.0)
LYMPHS PCT: 29.3 % (ref 12.0–46.0)
Lymphs Abs: 1.6 10*3/uL (ref 0.7–4.0)
MCHC: 33.1 g/dL (ref 30.0–36.0)
MCV: 90.9 fl (ref 78.0–100.0)
Monocytes Absolute: 0.6 10*3/uL (ref 0.1–1.0)
Monocytes Relative: 11.3 % (ref 3.0–12.0)
NEUTROS PCT: 57.2 % (ref 43.0–77.0)
Neutro Abs: 3.1 10*3/uL (ref 1.4–7.7)
Platelets: 146 10*3/uL — ABNORMAL LOW (ref 150.0–400.0)
RBC: 3.74 Mil/uL — AB (ref 3.87–5.11)
RDW: 13.9 % (ref 11.5–14.6)
WBC: 5.4 10*3/uL (ref 4.5–10.5)

## 2013-09-14 LAB — VALPROIC ACID LEVEL: Valproic Acid Lvl: 74.9 ug/mL (ref 50.0–100.0)

## 2013-09-16 ENCOUNTER — Encounter: Payer: Self-pay | Admitting: Family Medicine

## 2013-09-16 ENCOUNTER — Ambulatory Visit (INDEPENDENT_AMBULATORY_CARE_PROVIDER_SITE_OTHER): Payer: Medicare Other | Admitting: Family Medicine

## 2013-09-16 VITALS — BP 110/78 | HR 68 | Temp 97.9°F | Ht 61.5 in | Wt 171.5 lb

## 2013-09-16 DIAGNOSIS — N189 Chronic kidney disease, unspecified: Secondary | ICD-10-CM

## 2013-09-16 DIAGNOSIS — F319 Bipolar disorder, unspecified: Secondary | ICD-10-CM

## 2013-09-16 DIAGNOSIS — Z0000 Encounter for general adult medical examination without abnormal findings: Secondary | ICD-10-CM | POA: Diagnosis not present

## 2013-09-16 DIAGNOSIS — N9489 Other specified conditions associated with female genital organs and menstrual cycle: Secondary | ICD-10-CM

## 2013-09-16 MED ORDER — OXYBUTYNIN CHLORIDE 5 MG PO TABS: 5.0000 mg | ORAL_TABLET | Freq: Two times a day (BID) | ORAL | Status: DC

## 2013-09-16 NOTE — Assessment & Plan Note (Signed)
Discussed avoiding NSAIDs and staying well hydrated.

## 2013-09-16 NOTE — Progress Notes (Signed)
Pre-visit discussion using our clinic review tool. No additional management support is needed unless otherwise documented below in the visit note.  

## 2013-09-16 NOTE — Progress Notes (Signed)
BP 110/78  Pulse 68  Temp(Src) 97.9 F (36.6 C) (Oral)  Ht 5' 1.5" (1.562 m)  Wt 171 lb 8 oz (77.792 kg)  BMI 31.88 kg/m2   CC: medicare wellness, subsequent  Subjective:    Patient ID: Gina [name], female    DOB: 1951-02-24, 63 y.o.   MRN: [id number]  HPI: Gina [name] is a 63 y.o. female presenting on 09/16/2013 with Annual Exam  Vision screen passed.  Trouble with L ear on hearing screen, pt denies trouble. Falls risk assessment: 1 fall in last year with injury after dizziness - this happened in summer Depression screen - negative.   Lives with sister, 1 60. Occupation: disabled, for bipolar and arthritis  Edu: GED  Activity: take walks  Diet: good water, vegetables daily   Preventative:  She is s/p hysterectomy and LSO 1981, pt states there may have been some concern for uterine cancer - pap last year 2014 normal. Due for rpt next year. Requests mammogram and breast exam today.  H/o colon cancer s/p colectomy 2008. Colonoscopy ([facility]) 2014 - rec rpt 2 yrs 2/2 adenomatous polyps Flu shot - 04/2013  Tetanus shot allergy in chart, pt states may have caused small rash - consider retrial in future. Shingles - declines - has never had chicken pox Advanced directives: would want HC proxy to be sister Lelan Pons.  May find notary to help her make will.  Relevant past medical, surgical, family and social history reviewed and updated. Allergies and medications reviewed and updated. Current Outpatient Prescriptions on File Prior to Visit  Medication Sig  . cholecalciferol (VITAMIN D) 400 UNITS TABS Take 400 Units by mouth daily.  . divalproex (DEPAKOTE ER) 500 MG 24 hr tablet Take 1,000 mg by mouth at bedtime.   . docusate sodium (COLACE) 100 MG capsule Take 100 mg by mouth at bedtime.   . ferrous sulfate 325 (65 FE) MG tablet Take 1 tablet (325 mg total) by mouth daily with breakfast.  . FLUoxetine (PROZAC) 40 MG capsule Take 1 capsule (40 mg total) by mouth  daily.  Marland Kitchen lovastatin (MEVACOR) 40 MG tablet TAKE 1 TABLET (40 MG TOTAL) BY MOUTH AT BEDTIME.   No current facility-administered medications on file prior to visit.    Review of Systems  Constitutional: Negative for fever, chills, activity change, appetite change, fatigue and unexpected weight change.  HENT: Negative for hearing loss.   Eyes: Positive for visual disturbance (occasional floaters).  Respiratory: Negative for cough, chest tightness, shortness of breath and wheezing.   Cardiovascular: Negative for chest pain, palpitations and leg swelling.  Gastrointestinal: Negative for nausea, vomiting, abdominal pain, diarrhea, constipation, blood in stool and abdominal distention.  Genitourinary: Negative for hematuria and difficulty urinating.  Musculoskeletal: Negative for arthralgias, myalgias and neck pain.  Skin: Negative for rash.  Neurological: Positive for headaches. Negative for dizziness, seizures and syncope.  Hematological: Negative for adenopathy. Does not bruise/bleed easily.  Psychiatric/Behavioral: Negative for dysphoric mood. The patient is not nervous/anxious.    Per HPI unless specifically indicated above    Objective:    BP 110/78  Pulse 68  Temp(Src) 97.9 F (36.6 C) (Oral)  Ht 5' 1.5" (1.562 m)  Wt 171 lb 8 oz (77.792 kg)  BMI 31.88 kg/m2  Physical Exam  Nursing note and vitals reviewed. Constitutional: She is oriented to person, place, and time. She appears well-developed and well-nourished. No distress.  HENT:  Head: Normocephalic and atraumatic.  Right Ear: Hearing, tympanic membrane, external ear  and ear canal normal.  Left Ear: Hearing, tympanic membrane, external ear and ear canal normal.  Nose: Nose normal.  Mouth/Throat: Uvula is midline, oropharynx is clear and moist and mucous membranes are normal. No oropharyngeal exudate, posterior oropharyngeal edema or posterior oropharyngeal erythema.  Eyes: Conjunctivae and EOM are normal. Pupils are equal,  round, and reactive to light. No scleral icterus.  Neck: Normal range of motion. Neck supple.  Cardiovascular: Normal rate, regular rhythm, normal heart sounds and intact distal pulses.   No murmur heard. Pulses:      Radial pulses are 2+ on the right side, and 2+ on the left side.  Pulmonary/Chest: Effort normal and breath sounds normal. No respiratory distress. She has no wheezes. She has no rales. Right breast exhibits no inverted nipple, no mass, no nipple discharge, no skin change and no tenderness. Left breast exhibits no inverted nipple, no mass, no nipple discharge, no skin change and no tenderness.  Abdominal: Soft. Bowel sounds are normal. She exhibits no distension and no mass. There is no tenderness. There is no rebound and no guarding.  Musculoskeletal: Normal range of motion. She exhibits no edema.  Lymphadenopathy:       Head (right side): No submandibular, no tonsillar, no preauricular and no posterior auricular adenopathy present.       Head (left side): No submandibular, no tonsillar, no preauricular and no posterior auricular adenopathy present.    She has no cervical adenopathy.    She has no axillary adenopathy.       Right axillary: No lateral adenopathy present.       Left axillary: No lateral adenopathy present.      Right: No supraclavicular adenopathy present.       Left: No supraclavicular adenopathy present.  Neurological: She is alert and oriented to person, place, and time.  CN grossly intact, station and gait intact 5/5 calculation 3/3 recall.  Skin: Skin is warm and dry. No rash noted.  Psychiatric: She has a normal mood and affect. Her behavior is normal. Judgment and thought content normal.   Results for orders placed in visit on 09/13/13  CBC WITH DIFFERENTIAL      Result Value Ref Range   WBC 5.4  4.5 - 10.5 K/uL   RBC 3.74 (*) 3.87 - 5.11 Mil/uL   Hemoglobin 11.3 (*) 12.0 - 15.0 g/dL   HCT 34.0 (*) 36.0 - 46.0 %   MCV 90.9  78.0 - 100.0 fl   MCHC  33.1  30.0 - 36.0 g/dL   RDW 13.9  11.5 - 14.6 %   Platelets 146.0 (*) 150.0 - 400.0 K/uL   Neutrophils Relative % 57.2  43.0 - 77.0 %   Lymphocytes Relative 29.3  12.0 - 46.0 %   Monocytes Relative 11.3  3.0 - 12.0 %   Eosinophils Relative 1.8  0.0 - 5.0 %   Basophils Relative 0.4  0.0 - 3.0 %   Neutro Abs 3.1  1.4 - 7.7 K/uL   Lymphs Abs 1.6  0.7 - 4.0 K/uL   Monocytes Absolute 0.6  0.1 - 1.0 K/uL   Eosinophils Absolute 0.1  0.0 - 0.7 K/uL   Basophils Absolute 0.0  0.0 - 0.1 K/uL  RENAL FUNCTION PANEL      Result Value Ref Range   Sodium 142  135 - 145 mEq/L   Potassium 5.1  3.5 - 5.1 mEq/L   Chloride 106  96 - 112 mEq/L   CO2 29  19 - 32  mEq/L   Calcium 9.2  8.4 - 10.5 mg/dL   Albumin 3.3 (*) 3.5 - 5.2 g/dL   BUN 25 (*) 6 - 23 mg/dL   Creatinine, Ser 1.2  0.4 - 1.2 mg/dL   Glucose, Bld 91  70 - 99 mg/dL   Phosphorus 3.4  2.3 - 4.6 mg/dL   GFR 47.73 (*) >60.00 mL/min  VALPROIC ACID LEVEL      Result Value Ref Range   Valproic Acid Lvl 74.9  50.0 - 100.0 ug/mL      Assessment & Plan:   Problem List Items Addressed This Visit   RESOLVED: Adnexal mass   Bipolar 1 disorder     Continue to follow with psych    CKD (chronic kidney disease) stage 3, GFR 30-59 ml/min     Discussed avoiding NSAIDs and staying well hydrated.    Medicare annual wellness visit, initial - Primary     I have personally reviewed the Medicare Annual Wellness questionnaire and have noted 1. The patient's medical and social history 2. Their use of alcohol, tobacco or illicit drugs 3. Their current medications and supplements 4. The patient's functional ability including ADL's, fall risks, home safety risks and hearing or visual impairment. 5. Diet and physical activity 6. Evidence for depression or mood disorders The patients weight, height, BMI have been recorded in the chart.  Hearing and vision has been addressed. I have made referrals, counseling and provided education to the patient based  review of the above and I have provided the pt with a written personalized care plan for preventive services. See scanned questionairre. Advanced directives discussed: pt will work on setting up living will  Reviewed preventative protocols and updated unless pt declined. UTD colonoscopy - will be due next year. CBE today - asked pt to call to schedule mammogram at [facility]. Pap due next year.        Follow up plan: Return in about 6 months (around 03/16/2014), or as needed, for follow up.

## 2013-09-16 NOTE — Assessment & Plan Note (Signed)
I have personally reviewed the Medicare Annual Wellness questionnaire and have noted 1. The patient's medical and social history 2. Their use of alcohol, tobacco or illicit drugs 3. Their current medications and supplements 4. The patient's functional ability including ADL's, fall risks, home safety risks and hearing or visual impairment. 5. Diet and physical activity 6. Evidence for depression or mood disorders The patients weight, height, BMI have been recorded in the chart.  Hearing and vision has been addressed. I have made referrals, counseling and provided education to the patient based review of the above and I have provided the pt with a written personalized care plan for preventive services. See scanned questionairre. Advanced directives discussed: pt will work on setting up living will  Reviewed preventative protocols and updated unless pt declined. UTD colonoscopy - will be due next year. CBE today - asked pt to call to schedule mammogram at [facility]. Pap due next year.

## 2013-09-16 NOTE — Assessment & Plan Note (Signed)
Continue to follow with psych

## 2013-09-16 NOTE — Patient Instructions (Addendum)
I've refilled oxybutynin for you. Pelvic exam/pap smear next year. Breast exam today - call to schedule mammogram (check up front for [facility] #) and let us know if any trouble scheduling. Work on setting up advanced directive. Return in 6 months for blood work and afterwards for follow up visit.

## 2013-10-12 DIAGNOSIS — F319 Bipolar disorder, unspecified: Secondary | ICD-10-CM | POA: Diagnosis not present

## 2013-11-15 DIAGNOSIS — F319 Bipolar disorder, unspecified: Secondary | ICD-10-CM | POA: Diagnosis not present

## 2013-12-16 ENCOUNTER — Ambulatory Visit: Payer: Medicare Other | Admitting: Family Medicine

## 2013-12-21 DIAGNOSIS — F319 Bipolar disorder, unspecified: Secondary | ICD-10-CM | POA: Diagnosis not present

## 2013-12-21 DIAGNOSIS — G4733 Obstructive sleep apnea (adult) (pediatric): Secondary | ICD-10-CM | POA: Diagnosis not present

## 2013-12-29 ENCOUNTER — Encounter: Payer: Self-pay | Admitting: Family Medicine

## 2013-12-29 ENCOUNTER — Ambulatory Visit: Payer: Self-pay | Admitting: Family Medicine

## 2013-12-29 DIAGNOSIS — R928 Other abnormal and inconclusive findings on diagnostic imaging of breast: Secondary | ICD-10-CM | POA: Diagnosis not present

## 2013-12-29 DIAGNOSIS — Z1231 Encounter for screening mammogram for malignant neoplasm of breast: Secondary | ICD-10-CM | POA: Diagnosis not present

## 2014-01-02 ENCOUNTER — Telehealth: Payer: Self-pay

## 2014-01-02 ENCOUNTER — Ambulatory Visit (INDEPENDENT_AMBULATORY_CARE_PROVIDER_SITE_OTHER): Payer: Medicare Other | Admitting: Family Medicine

## 2014-01-02 ENCOUNTER — Encounter: Payer: Self-pay | Admitting: Family Medicine

## 2014-01-02 VITALS — BP 122/72 | HR 77 | Temp 98.0°F | Wt 178.0 lb

## 2014-01-02 DIAGNOSIS — G4733 Obstructive sleep apnea (adult) (pediatric): Secondary | ICD-10-CM | POA: Diagnosis not present

## 2014-01-02 DIAGNOSIS — Z9989 Dependence on other enabling machines and devices: Principal | ICD-10-CM

## 2014-01-02 DIAGNOSIS — J209 Acute bronchitis, unspecified: Secondary | ICD-10-CM | POA: Diagnosis not present

## 2014-01-02 HISTORY — PX: BREAST BIOPSY: SHX20

## 2014-01-02 MED ORDER — AZITHROMYCIN 250 MG PO TABS: ORAL_TABLET | ORAL | Status: DC

## 2014-01-02 NOTE — Assessment & Plan Note (Signed)
Anticipate acute bronchitis - treat with plain mucinex and zpack. Update if sxs persist.

## 2014-01-02 NOTE — Progress Notes (Signed)
BP 122/72  Pulse 77  Temp(Src) 98 F (36.7 C) (Oral)  Wt 178 lb (80.74 kg)  SpO2 97%   CC: cold sxs  Subjective:    Patient ID: Gina [name], female    DOB: 08-07-1950, 63 y.o.   MRN: [id number]  HPI: Gina [name] is a 63 y.o. female presenting on 01/02/2014 for URI   1.5 wk h/o cold sxs - ST, headache, sinus congestion, lower abd discomfort, some chills and productive cough. Main concern is congested head. No fevers, diarrhea. Has not tried any meds for this. Sister smokes at home. No asthma hx.  Saw psychiatrist - was asked to schedule sleep study. Staying very somnolent during the day. Sleeps heavy at night. Snores. Uses CPAP machine.  Abnormal screening mammo - calcifications R breast. Was not contacted for f/u. Advised to call [facility] to schedule f/u.  Past Medical History  Diagnosis Date  . Bipolar depression   . DJD (degenerative joint disease), lumbar     chronic lower back pain  . Obesity (BMI 30-39.9)   . History of colon cancer     s/p surgery  . Enterocutaneous fistula 04/07/2012    completed PT 06/2012 {([facility])  . OSA on CPAP     6cm H2O  . RBBB   . Anemia   . Blood transfusion without reported diagnosis 2009  . Cataract     left  . GERD (gastroesophageal reflux disease)   . Hyperlipidemia   . Hypertension   . History of bladder cancer 1997  . History of uterine cancer     s/p hysterectomy  . CRI (chronic renal insufficiency)   . Personal history of colonic adenomas and colon cancer 11/11/2012  . IDA (iron deficiency anemia) 01/2013    thought due to h/o polyps    Relevant past medical, surgical, family and social history reviewed and updated as indicated.  Allergies and medications reviewed and updated. Current Outpatient Prescriptions on File Prior to Visit  Medication Sig  . cholecalciferol (VITAMIN D) 400 UNITS TABS Take 400 Units by mouth daily.  . divalproex (DEPAKOTE ER) 500 MG 24 hr tablet Take 1,000 mg by  mouth at bedtime.   . docusate sodium (COLACE) 100 MG capsule Take 100 mg by mouth at bedtime.   . ferrous sulfate 325 (65 FE) MG tablet Take 1 tablet (325 mg total) by mouth daily with breakfast.  . FLUoxetine (PROZAC) 40 MG capsule Take 1 capsule (40 mg total) by mouth daily.  Marland Kitchen lovastatin (MEVACOR) 40 MG tablet TAKE 1 TABLET (40 MG TOTAL) BY MOUTH AT BEDTIME.  Marland Kitchen oxybutynin (DITROPAN) 5 MG tablet Take 1 tablet (5 mg total) by mouth 2 (two) times daily. Bladder control  . senna-docusate (SENOKOT-S) 8.6-50 MG per tablet Take 1 tablet by mouth daily as needed for mild constipation.   No current facility-administered medications on file prior to visit.    Review of Systems Per HPI unless specifically indicated above    Objective:    BP 122/72  Pulse 77  Temp(Src) 98 F (36.7 C) (Oral)  Wt 178 lb (80.74 kg)  SpO2 97%  Physical Exam  Nursing note and vitals reviewed. Constitutional: She appears well-developed and well-nourished. No distress.  HENT:  Head: Normocephalic and atraumatic.  Right Ear: Hearing, tympanic membrane, external ear and ear canal normal.  Left Ear: Hearing, tympanic membrane, external ear and ear canal normal.  Nose: Nose normal. No mucosal edema or rhinorrhea. Right sinus exhibits no maxillary  sinus tenderness and no frontal sinus tenderness. Left sinus exhibits no maxillary sinus tenderness and no frontal sinus tenderness.  Mouth/Throat: Uvula is midline, oropharynx is clear and moist and mucous membranes are normal. No oropharyngeal exudate, posterior oropharyngeal edema, posterior oropharyngeal erythema or tonsillar abscesses.  Eyes: Conjunctivae and EOM are normal. Pupils are equal, round, and reactive to light. No scleral icterus.  Neck: Normal range of motion. Neck supple.  Cardiovascular: Normal rate, regular rhythm, normal heart sounds and intact distal pulses.   No murmur heard. Pulmonary/Chest: Effort normal. No respiratory distress. She has no wheezes.  She has rhonchi (L scattered). She has no rales.  Lymphadenopathy:    She has no cervical adenopathy.  Skin: Skin is warm and dry. No rash noted.       Assessment & Plan:   Problem List Items Addressed This Visit   OSA on CPAP     I've asked her to call me with name of CPAP machine company to request download of usage information , may need to refer to local pulm (prior sleep study done in [place] and I never received records).    Acute bronchitis - Primary     Anticipate acute bronchitis - treat with plain mucinex and zpack. Update if sxs persist.        Follow up plan: Return if symptoms worsen or fail to improve.

## 2014-01-02 NOTE — Assessment & Plan Note (Signed)
I've asked her to call me with name of CPAP machine company to request download of usage information , may need to refer to local pulm (prior sleep study done in [place] and I never received records).

## 2014-01-02 NOTE — Progress Notes (Signed)
Pre-visit discussion using our clinic review tool. No additional management support is needed unless otherwise documented below in the visit note.  

## 2014-01-02 NOTE — Telephone Encounter (Signed)
Pt left v/m; pt was to cb with cpap company; [facility] [phone].

## 2014-01-02 NOTE — Patient Instructions (Addendum)
Call [facility] to schedule follow up imaging for right breast. I think you have acute bronchitis. Treat with zpack, start plain mucinex or immediate release guaifenesin (nothing else in this) with plenty of water to help mobilize mucous out. Let us know if not improving with this. Call us with the name of your [facility] as I want to get a download of usage information.

## 2014-01-03 ENCOUNTER — Encounter: Payer: Self-pay | Admitting: Family Medicine

## 2014-01-03 ENCOUNTER — Ambulatory Visit: Payer: Self-pay | Admitting: Family Medicine

## 2014-01-03 DIAGNOSIS — R928 Other abnormal and inconclusive findings on diagnostic imaging of breast: Secondary | ICD-10-CM | POA: Diagnosis not present

## 2014-01-03 DIAGNOSIS — R922 Inconclusive mammogram: Secondary | ICD-10-CM | POA: Diagnosis not present

## 2014-01-03 NOTE — Telephone Encounter (Signed)
Noted. Pt has not established with local pulm for sleep apnea.  Will refer.

## 2014-01-09 ENCOUNTER — Encounter: Payer: Self-pay | Admitting: Family Medicine

## 2014-01-09 ENCOUNTER — Ambulatory Visit: Payer: Self-pay | Admitting: Family Medicine

## 2014-01-09 DIAGNOSIS — N63 Unspecified lump in unspecified breast: Secondary | ICD-10-CM | POA: Diagnosis not present

## 2014-01-09 DIAGNOSIS — D249 Benign neoplasm of unspecified breast: Secondary | ICD-10-CM | POA: Diagnosis not present

## 2014-01-09 NOTE — Telephone Encounter (Signed)
Appt made with [facility] and patient notified.

## 2014-01-11 LAB — PATHOLOGY REPORT

## 2014-01-12 ENCOUNTER — Encounter: Payer: Self-pay | Admitting: Family Medicine

## 2014-02-01 ENCOUNTER — Institutional Professional Consult (permissible substitution): Payer: Medicare Other | Admitting: Pulmonary Disease

## 2014-02-01 HISTORY — PX: OTHER SURGICAL HISTORY: SHX169

## 2014-02-13 DIAGNOSIS — G4730 Sleep apnea, unspecified: Secondary | ICD-10-CM | POA: Diagnosis not present

## 2014-02-20 ENCOUNTER — Telehealth: Payer: Self-pay

## 2014-02-20 NOTE — Telephone Encounter (Signed)
Pt left v/m; pt has scheduled appt on 03/16/14 at 10 AM and pt wants to know if needs fasting labs prior to appt. 09/16/13 office note has pt to return for lab appt then schedule f/u visit. Do you want pt to have labs prior to appt?

## 2014-02-20 NOTE — Telephone Encounter (Signed)
Yes plz schedule fasting labwork prior.

## 2014-02-21 NOTE — Telephone Encounter (Signed)
Patient notified and lab appt scheduled.

## 2014-02-23 ENCOUNTER — Ambulatory Visit: Payer: Self-pay | Admitting: Specialist

## 2014-02-23 DIAGNOSIS — G4730 Sleep apnea, unspecified: Secondary | ICD-10-CM | POA: Diagnosis not present

## 2014-02-23 DIAGNOSIS — G4710 Hypersomnia, unspecified: Secondary | ICD-10-CM | POA: Diagnosis not present

## 2014-02-23 DIAGNOSIS — G478 Other sleep disorders: Secondary | ICD-10-CM | POA: Diagnosis not present

## 2014-02-23 DIAGNOSIS — R0609 Other forms of dyspnea: Secondary | ICD-10-CM | POA: Diagnosis not present

## 2014-02-23 DIAGNOSIS — E669 Obesity, unspecified: Secondary | ICD-10-CM | POA: Diagnosis not present

## 2014-02-23 DIAGNOSIS — G4733 Obstructive sleep apnea (adult) (pediatric): Secondary | ICD-10-CM | POA: Diagnosis not present

## 2014-03-02 ENCOUNTER — Ambulatory Visit: Payer: Self-pay | Admitting: Specialist

## 2014-03-02 DIAGNOSIS — G4710 Hypersomnia, unspecified: Secondary | ICD-10-CM | POA: Diagnosis not present

## 2014-03-02 DIAGNOSIS — G4733 Obstructive sleep apnea (adult) (pediatric): Secondary | ICD-10-CM | POA: Diagnosis not present

## 2014-03-02 DIAGNOSIS — G4730 Sleep apnea, unspecified: Secondary | ICD-10-CM | POA: Diagnosis not present

## 2014-03-03 ENCOUNTER — Telehealth: Payer: Self-pay

## 2014-03-03 DIAGNOSIS — D509 Iron deficiency anemia, unspecified: Secondary | ICD-10-CM

## 2014-03-03 DIAGNOSIS — E785 Hyperlipidemia, unspecified: Secondary | ICD-10-CM

## 2014-03-03 DIAGNOSIS — F319 Bipolar disorder, unspecified: Secondary | ICD-10-CM

## 2014-03-03 NOTE — Telephone Encounter (Signed)
Pt left v/m; pt has lab appt scheduled on 03/10/14 and pt wants to know if depakote level can be done at that lab appt. Pt request cb. Pt has 6 mth f/u appt already scheduled on 03/16/14.Please advise.

## 2014-03-04 NOTE — Telephone Encounter (Signed)
Yes, ordered with other labs.  Thanks.

## 2014-03-05 ENCOUNTER — Other Ambulatory Visit: Payer: Self-pay | Admitting: Family Medicine

## 2014-03-05 DIAGNOSIS — N183 Chronic kidney disease, stage 3 unspecified: Secondary | ICD-10-CM

## 2014-03-05 DIAGNOSIS — E785 Hyperlipidemia, unspecified: Secondary | ICD-10-CM

## 2014-03-05 DIAGNOSIS — D509 Iron deficiency anemia, unspecified: Secondary | ICD-10-CM

## 2014-03-06 NOTE — Telephone Encounter (Signed)
Patient advised.

## 2014-03-10 ENCOUNTER — Other Ambulatory Visit (INDEPENDENT_AMBULATORY_CARE_PROVIDER_SITE_OTHER): Payer: Medicare Other

## 2014-03-10 DIAGNOSIS — N183 Chronic kidney disease, stage 3 unspecified: Secondary | ICD-10-CM

## 2014-03-10 DIAGNOSIS — E785 Hyperlipidemia, unspecified: Secondary | ICD-10-CM | POA: Diagnosis not present

## 2014-03-10 DIAGNOSIS — F319 Bipolar disorder, unspecified: Secondary | ICD-10-CM

## 2014-03-10 DIAGNOSIS — D509 Iron deficiency anemia, unspecified: Secondary | ICD-10-CM

## 2014-03-10 LAB — LIPID PANEL
CHOLESTEROL: 170 mg/dL (ref 0–200)
HDL: 34.5 mg/dL — ABNORMAL LOW (ref >39.00)
LDL CALC: 105 mg/dL — AB (ref 0–99)
NonHDL: 135.5
Total CHOL/HDL Ratio: 5
Triglycerides: 151 mg/dL — ABNORMAL HIGH (ref 0.0–149.0)
VLDL: 30.2 mg/dL (ref 0.0–40.0)

## 2014-03-10 LAB — CBC WITH DIFFERENTIAL/PLATELET
BASOS ABS: 0 10*3/uL (ref 0.0–0.1)
Basophils Relative: 0.4 % (ref 0.0–3.0)
Eosinophils Absolute: 0.1 10*3/uL (ref 0.0–0.7)
Eosinophils Relative: 1.6 % (ref 0.0–5.0)
HCT: 34.7 % — ABNORMAL LOW (ref 36.0–46.0)
Hemoglobin: 11.7 g/dL — ABNORMAL LOW (ref 12.0–15.0)
LYMPHS ABS: 1.7 10*3/uL (ref 0.7–4.0)
Lymphocytes Relative: 32.1 % (ref 12.0–46.0)
MCHC: 33.7 g/dL (ref 30.0–36.0)
MCV: 91.9 fl (ref 78.0–100.0)
MONO ABS: 0.6 10*3/uL (ref 0.1–1.0)
Monocytes Relative: 11.6 % (ref 3.0–12.0)
Neutro Abs: 2.9 10*3/uL (ref 1.4–7.7)
Neutrophils Relative %: 54.3 % (ref 43.0–77.0)
PLATELETS: 149 10*3/uL — AB (ref 150.0–400.0)
RBC: 3.77 Mil/uL — ABNORMAL LOW (ref 3.87–5.11)
RDW: 13.9 % (ref 11.5–15.5)
WBC: 5.3 10*3/uL (ref 4.0–10.5)

## 2014-03-10 LAB — RENAL FUNCTION PANEL
Albumin: 3.6 g/dL (ref 3.5–5.2)
BUN: 30 mg/dL — AB (ref 6–23)
CALCIUM: 9.6 mg/dL (ref 8.4–10.5)
CO2: 28 meq/L (ref 19–32)
Chloride: 102 mEq/L (ref 96–112)
Creatinine, Ser: 1.2 mg/dL (ref 0.4–1.2)
GFR: 46.33 mL/min — ABNORMAL LOW (ref >60.00)
Glucose, Bld: 102 mg/dL — ABNORMAL HIGH (ref 70–99)
Phosphorus: 3.4 mg/dL (ref 2.3–4.6)
Potassium: 5.4 mEq/L — ABNORMAL HIGH (ref 3.5–5.1)
Sodium: 137 mEq/L (ref 135–145)

## 2014-03-10 LAB — FERRITIN: FERRITIN: 146.3 ng/mL (ref 10.0–291.0)

## 2014-03-10 LAB — IBC PANEL
Iron: 71 ug/dL (ref 42–145)
Saturation Ratios: 19.1 % — ABNORMAL LOW (ref 20.0–50.0)
Transferrin: 265.4 mg/dL (ref 212.0–360.0)

## 2014-03-11 LAB — VALPROIC ACID LEVEL: Valproic Acid Lvl: <12.5 ug/mL — ABNORMAL LOW (ref 50.0–100.0)

## 2014-03-16 ENCOUNTER — Encounter: Payer: Self-pay | Admitting: *Deleted

## 2014-03-16 ENCOUNTER — Ambulatory Visit (INDEPENDENT_AMBULATORY_CARE_PROVIDER_SITE_OTHER): Payer: Medicare Other | Admitting: Family Medicine

## 2014-03-16 ENCOUNTER — Encounter: Payer: Self-pay | Admitting: Family Medicine

## 2014-03-16 VITALS — BP 128/70 | HR 72 | Temp 98.3°F | Wt 188.0 lb

## 2014-03-16 DIAGNOSIS — N183 Chronic kidney disease, stage 3 unspecified: Secondary | ICD-10-CM

## 2014-03-16 DIAGNOSIS — D509 Iron deficiency anemia, unspecified: Secondary | ICD-10-CM | POA: Diagnosis not present

## 2014-03-16 DIAGNOSIS — R32 Unspecified urinary incontinence: Secondary | ICD-10-CM

## 2014-03-16 DIAGNOSIS — F319 Bipolar disorder, unspecified: Secondary | ICD-10-CM | POA: Diagnosis not present

## 2014-03-16 DIAGNOSIS — E785 Hyperlipidemia, unspecified: Secondary | ICD-10-CM

## 2014-03-16 DIAGNOSIS — M25561 Pain in right knee: Secondary | ICD-10-CM

## 2014-03-16 DIAGNOSIS — G4733 Obstructive sleep apnea (adult) (pediatric): Secondary | ICD-10-CM

## 2014-03-16 DIAGNOSIS — M25569 Pain in unspecified knee: Secondary | ICD-10-CM

## 2014-03-16 DIAGNOSIS — Z9989 Dependence on other enabling machines and devices: Secondary | ICD-10-CM

## 2014-03-16 LAB — VITAMIN D 25 HYDROXY (VIT D DEFICIENCY, FRACTURES): VITD: 51.05 ng/mL (ref 30.00–100.00)

## 2014-03-16 LAB — RENAL FUNCTION PANEL
ALBUMIN: 3.5 g/dL (ref 3.5–5.2)
BUN: 29 mg/dL — AB (ref 6–23)
CALCIUM: 9.5 mg/dL (ref 8.4–10.5)
CHLORIDE: 102 meq/L (ref 96–112)
CO2: 30 meq/L (ref 19–32)
CREATININE: 1.2 mg/dL (ref 0.4–1.2)
GFR: 48.11 mL/min — ABNORMAL LOW (ref >60.00)
Glucose, Bld: 187 mg/dL — ABNORMAL HIGH (ref 70–99)
Phosphorus: 3.6 mg/dL (ref 2.3–4.6)
Potassium: 5.1 mEq/L (ref 3.5–5.1)
Sodium: 138 mEq/L (ref 135–145)

## 2014-03-16 LAB — TSH: TSH: 4.47 u[IU]/mL (ref 0.35–4.50)

## 2014-03-16 NOTE — Progress Notes (Signed)
Pre visit review using our clinic review tool, if applicable. No additional management support is needed unless otherwise documented below in the visit note. 

## 2014-03-16 NOTE — Assessment & Plan Note (Signed)
Reviewed FLP - encouraged increased aerobic exercise to increase HDL.

## 2014-03-16 NOTE — Patient Instructions (Signed)
Let's stop iron pill  You do have chronic kidney disease - make sure you stay well hydrated. Good to see you today, call us with questions. Return in 6 months for physical.  Chronic Kidney Disease Chronic kidney disease occurs when the kidneys are damaged over a long period. The kidneys are two organs that lie on either side of the spine between the middle of the back and the front of the abdomen. The kidneys:   Remove wastes and extra water from the blood.   Produce important hormones. These help keep bones strong, regulate blood pressure, and help create red blood cells.   Balance the fluids and chemicals in the blood and tissues. A small amount of kidney damage may not cause problems, but a large amount of damage may make it difficult or impossible for the kidneys to work the way they should. If steps are not taken to slow down the kidney damage or stop it from getting worse, the kidneys may stop working permanently. Most of the time, chronic kidney disease does not go away. However, it can often be controlled, and those with the disease can usually live normal lives. CAUSES  The most common causes of chronic kidney disease are diabetes and high blood pressure (hypertension). Chronic kidney disease may also be caused by:   Diseases that cause the kidneys' filters to become inflamed.   Diseases that affect the immune system.   Genetic diseases.   Medicines that damage the kidneys, such as anti-inflammatory medicines.  Poisoning or exposure to toxic substances.   A reoccurring kidney or urinary infection.   A problem with urine flow. This may be caused by:   Cancer.   Kidney stones.   An enlarged prostate in males. SIGNS AND SYMPTOMS  Because the kidney damage in chronic kidney disease occurs slowly, symptoms develop slowly and may not be obvious until the kidney damage becomes severe. A person may have a kidney disease for years without showing any symptoms.  Symptoms can include:   Swelling (edema) of the legs, ankles, or feet.   Tiredness (lethargy).   Nausea or vomiting.   Confusion.   Problems with urination, such as:   Decreased urine production.   Frequent urination, especially at night.   Frequent accidents in children who are potty trained.   Muscle twitches and cramps.   Shortness of breath.  Weakness.   Persistent itchiness.   Loss of appetite.  Metallic taste in the mouth.  Trouble sleeping.  Slowed development in children.  Short stature in children. DIAGNOSIS  Chronic kidney disease may be detected and diagnosed by tests, including blood, urine, imaging, or kidney biopsy tests.  TREATMENT  Most chronic kidney diseases cannot be cured. Treatment usually involves relieving symptoms and preventing or slowing the progression of the disease. Treatment may include:   A special diet. You may need to avoid alcohol and foods thatare salty and high in potassium.   Medicines. These may:   Lower blood pressure.   Relieve anemia.   Relieve swelling.   Protect the bones. HOME CARE INSTRUCTIONS   Follow your prescribed diet.   Take medicines only as directed by your health care provider. Do not take any new medicines (prescription, over-the-counter, or nutritional supplements) unless approved by your health care provider. Many medicines can worsen your kidney damage or need to have the dose adjusted.   Quit smoking if you smoke. Talk to your health care provider about a smoking cessation program.   Keep all  follow-up visits as directed by your health care provider. SEEK IMMEDIATE MEDICAL CARE IF:  Your symptoms get worse or you develop new symptoms.   You develop symptoms of end-stage kidney disease. These include:   Headaches.   Abnormally dark or light skin.   Numbness in the hands or feet.   Easy bruising.   Frequent hiccups.   Menstruation stops.   You have a  fever.   You have decreased urine production.   You havepain or bleeding when urinating. MAKE SURE YOU:  Understand these instructions.  Will watch your condition.  Will get help right away if you are not doing well or get worse. FOR MORE INFORMATION   American Association of Kidney Patients: BombTimer.gl  National Kidney Foundation: www.kidney.[place]: https://mathis.com/  [employer] [facility]: www.lifeoptions.org and www.kidneyschool.org Document Released: 04/29/2008 Document Revised: 12/05/2013 Document Reviewed: 03/19/2012 [facility] Patient Information 2015 [place], [place]. This information is not intended to replace advice given to you by your health care provider. Make sure you discuss any questions you have with your health care provider.

## 2014-03-16 NOTE — Assessment & Plan Note (Signed)
Mild - has been regular with iron however over last year. Anticipate more CKD reated anemia Ok for trial off iron.

## 2014-03-16 NOTE — Assessment & Plan Note (Signed)
depakote level low today - rec discuss with psychiatrist. Mood stable.

## 2014-03-16 NOTE — Assessment & Plan Note (Signed)
Reviewed dx again with patient. Recheck K today as well as vitamin D and TSH today.

## 2014-03-16 NOTE — Assessment & Plan Note (Signed)
Actually improved and no longer needing oxybutynin

## 2014-03-16 NOTE — Assessment & Plan Note (Signed)
Anticipate osteoarthritis related flare. Discussed resting knee, elevation, ice, and ok to use tylenol prn pain. Update if sxs persist or deteriorate

## 2014-03-16 NOTE — Progress Notes (Signed)
BP 128/70  Pulse 72  Temp(Src) 98.3 F (36.8 C) (Oral)  Wt 188 lb (85.276 kg)   CC: 6 mo f/u  Subjective:    Patient ID: Gina [name], female    DOB: [birth year], 63 y.o.   MRN: [id number]  HPI: Gina [name] is a 63 y.o. female presenting on 03/16/2014 for Follow-up   Stopped oxybutynin because bladder accidents have resolved.  Labwork reviewed. Recently had sleep study done. No results available yet.  Having R knee pain ongoing for last 1-2 weeks. Denies inciting trauma/injury. Points to anterior knee.  Relevant past medical, surgical, family and social history reviewed and updated as indicated.  Allergies and medications reviewed and updated. Current Outpatient Prescriptions on File Prior to Visit  Medication Sig  . cholecalciferol (VITAMIN D) 400 UNITS TABS Take 400 Units by mouth daily.  . divalproex (DEPAKOTE ER) 500 MG 24 hr tablet Take 1,000 mg by mouth at bedtime.   . docusate sodium (COLACE) 100 MG capsule Take 100 mg by mouth at bedtime.   . ferrous sulfate 325 (65 FE) MG tablet Take 1 tablet (325 mg total) by mouth daily with breakfast.  . FLUoxetine (PROZAC) 40 MG capsule Take 1 capsule (40 mg total) by mouth daily.  Marland Kitchen lovastatin (MEVACOR) 40 MG tablet TAKE 1 TABLET (40 MG TOTAL) BY MOUTH AT BEDTIME.  Marland Kitchen oxybutynin (DITROPAN) 5 MG tablet Take 1 tablet (5 mg total) by mouth 2 (two) times daily. Bladder control  . senna-docusate (SENOKOT-S) 8.6-50 MG per tablet Take 1 tablet by mouth daily as needed for mild constipation.   No current facility-administered medications on file prior to visit.   Past Medical History  Diagnosis Date  . Bipolar depression   . DJD (degenerative joint disease), lumbar     chronic lower back pain  . Obesity (BMI 30-39.9)   . History of colon cancer     s/p surgery  . Enterocutaneous fistula 04/07/2012    completed PT 06/2012 {([facility])  . OSA on CPAP     6cm H2O  . RBBB   . Anemia in chronic kidney disease   . Blood  transfusion without reported diagnosis 2009  . Cataract     left  . GERD (gastroesophageal reflux disease)   . Hyperlipidemia   . Hypertension   . History of bladder cancer 1997  . History of uterine cancer     s/p hysterectomy  . CKD (chronic kidney disease) stage 3, GFR 30-59 ml/min   . Personal history of colonic adenomas and colon cancer 11/11/2012  . IDA (iron deficiency anemia) 01/2013    thought due to h/o polyps     Review of Systems Per HPI unless specifically indicated above    Objective:    BP 128/70  Pulse 72  Temp(Src) 98.3 F (36.8 C) (Oral)  Wt 188 lb (85.276 kg)  Physical Exam  Nursing note and vitals reviewed. Constitutional: She appears well-developed and well-nourished. No distress.  HENT:  Mouth/Throat: Oropharynx is clear and moist. No oropharyngeal exudate.  Eyes: Conjunctivae and EOM are normal. Pupils are equal, round, and reactive to light.  Some exophthalmos  Cardiovascular: Normal rate, regular rhythm, normal heart sounds and intact distal pulses.   No murmur heard. Pulmonary/Chest: Effort normal and breath sounds normal. No respiratory distress. She has no wheezes. She has no rales.  Musculoskeletal: She exhibits no edema.  Crepitus bilateral knees. R knee with tenderness to palpation medial joint line but negative mcmurray test  Skin:  Skin is warm and dry. No rash noted.  Psychiatric: She has a normal mood and affect.   Results for orders placed in visit on 03/10/14  VALPROIC ACID LEVEL      Result Value Ref Range   Valproic Acid Lvl <12.5 (*) 50.0 - 100.0 ug/mL  RENAL FUNCTION PANEL      Result Value Ref Range   Sodium 137  135 - 145 mEq/L   Potassium 5.4 (*) 3.5 - 5.1 mEq/L   Chloride 102  96 - 112 mEq/L   CO2 28  19 - 32 mEq/L   Calcium 9.6  8.4 - 10.5 mg/dL   Albumin 3.6  3.5 - 5.2 g/dL   BUN 30 (*) 6 - 23 mg/dL   Creatinine, Ser 1.2  0.4 - 1.2 mg/dL   Glucose, Bld 102 (*) 70 - 99 mg/dL   Phosphorus 3.4  2.3 - 4.6 mg/dL   GFR  46.33 (*) >60.00 mL/min  LIPID PANEL      Result Value Ref Range   Cholesterol 170  0 - 200 mg/dL   Triglycerides 151.0 (*) 0.0 - 149.0 mg/dL   HDL 34.50 (*) >39.00 mg/dL   VLDL 30.2  0.0 - 40.0 mg/dL   LDL Cholesterol 105 (*) 0 - 99 mg/dL   Total CHOL/HDL Ratio 5     NonHDL 135.50    CBC WITH DIFFERENTIAL      Result Value Ref Range   WBC 5.3  4.0 - 10.5 K/uL   RBC 3.77 (*) 3.87 - 5.11 Mil/uL   Hemoglobin 11.7 (*) 12.0 - 15.0 g/dL   HCT 34.7 (*) 36.0 - 46.0 %   MCV 91.9  78.0 - 100.0 fl   MCHC 33.7  30.0 - 36.0 g/dL   RDW 13.9  11.5 - 15.5 %   Platelets 149.0 (*) 150.0 - 400.0 K/uL   Neutrophils Relative % 54.3  43.0 - 77.0 %   Lymphocytes Relative 32.1  12.0 - 46.0 %   Monocytes Relative 11.6  3.0 - 12.0 %   Eosinophils Relative 1.6  0.0 - 5.0 %   Basophils Relative 0.4  0.0 - 3.0 %   Neutro Abs 2.9  1.4 - 7.7 K/uL   Lymphs Abs 1.7  0.7 - 4.0 K/uL   Monocytes Absolute 0.6  0.1 - 1.0 K/uL   Eosinophils Absolute 0.1  0.0 - 0.7 K/uL   Basophils Absolute 0.0  0.0 - 0.1 K/uL  FERRITIN      Result Value Ref Range   Ferritin 146.3  10.0 - 291.0 ng/mL  IBC PANEL      Result Value Ref Range   Iron 71  42 - 145 ug/dL   Transferrin 265.4  212.0 - 360.0 mg/dL   Saturation Ratios 19.1 (*) 20.0 - 50.0 %      Assessment & Plan:   Problem List Items Addressed This Visit   Bipolar 1 disorder     depakote level low today - rec discuss with psychiatrist. Mood stable.    CKD (chronic kidney disease) stage 3, GFR 30-59 ml/min - Primary     Reviewed dx again with patient. Recheck K today as well as vitamin D and TSH today.    Relevant Orders      Vit D  25 hydroxy (rtn osteoporosis monitoring)      Renal function panel   OSA on CPAP     Has established with local pulm for sleep study - i do not have results available.  She has f/u with pulm for next month    Urine incontinence     Actually improved and no longer needing oxybutynin    IDA (iron deficiency anemia)     Mild - has  been regular with iron however over last year. Anticipate more CKD reated anemia Ok for trial off iron.    Hyperlipidemia     Reviewed FLP - encouraged increased aerobic exercise to increase HDL.    Relevant Orders      TSH   Right knee pain     Anticipate osteoarthritis related flare. Discussed resting knee, elevation, ice, and ok to use tylenol prn pain. Update if sxs persist or deteriorate        Follow up plan: Return in about 6 months (around 09/16/2014), or as needed, for medicare wellness visit.

## 2014-03-16 NOTE — Assessment & Plan Note (Signed)
Has established with local pulm for sleep study - i do not have results available. She has f/u with pulm for next month

## 2014-03-17 ENCOUNTER — Encounter: Payer: Self-pay | Admitting: Family Medicine

## 2014-03-23 DIAGNOSIS — G4733 Obstructive sleep apnea (adult) (pediatric): Secondary | ICD-10-CM | POA: Diagnosis not present

## 2014-03-23 DIAGNOSIS — F319 Bipolar disorder, unspecified: Secondary | ICD-10-CM | POA: Diagnosis not present

## 2014-04-04 ENCOUNTER — Ambulatory Visit: Payer: Medicare Other

## 2014-04-25 ENCOUNTER — Ambulatory Visit: Payer: Medicare Other

## 2014-04-26 DIAGNOSIS — Z23 Encounter for immunization: Secondary | ICD-10-CM | POA: Diagnosis not present

## 2014-04-26 DIAGNOSIS — G4733 Obstructive sleep apnea (adult) (pediatric): Secondary | ICD-10-CM | POA: Diagnosis not present

## 2014-05-08 ENCOUNTER — Telehealth: Payer: Self-pay | Admitting: Family Medicine

## 2014-05-08 NOTE — Telephone Encounter (Signed)
Patient notified not due until age 63.

## 2014-05-08 NOTE — Telephone Encounter (Signed)
Pt came in wanting to know if she needs to get a pneumonia shot

## 2014-05-10 ENCOUNTER — Ambulatory Visit (INDEPENDENT_AMBULATORY_CARE_PROVIDER_SITE_OTHER): Payer: Medicare Other | Admitting: Family Medicine

## 2014-05-10 ENCOUNTER — Encounter: Payer: Self-pay | Admitting: Family Medicine

## 2014-05-10 VITALS — BP 156/90 | HR 68 | Temp 98.3°F | Wt 190.5 lb

## 2014-05-10 DIAGNOSIS — Z9989 Dependence on other enabling machines and devices: Secondary | ICD-10-CM

## 2014-05-10 DIAGNOSIS — F319 Bipolar disorder, unspecified: Secondary | ICD-10-CM | POA: Diagnosis not present

## 2014-05-10 DIAGNOSIS — G479 Sleep disorder, unspecified: Secondary | ICD-10-CM | POA: Diagnosis not present

## 2014-05-10 DIAGNOSIS — G4733 Obstructive sleep apnea (adult) (pediatric): Secondary | ICD-10-CM | POA: Diagnosis not present

## 2014-05-10 MED ORDER — QUETIAPINE FUMARATE 50 MG PO TABS: 50.0000 mg | ORAL_TABLET | Freq: Every day | ORAL | Status: DC

## 2014-05-10 MED ORDER — DIVALPROEX SODIUM ER 500 MG PO TB24: 1500.0000 mg | ORAL_TABLET | Freq: Every day | ORAL | Status: DC

## 2014-05-10 NOTE — Assessment & Plan Note (Addendum)
Doubt bipolar related as she's not necessarily describing mania/hypomania. I did recommended she increase depakote to 1500mg  ER nightly. If this doesn't help, may add seroquel 50mg  at bedtime. Both meds sent to pharmacy. I asked her to again touch base with Dr. Gust Brooms office re new CPAP machine or mask fitting. If not better or if manic sxs developing - rec f/u sooner with Dr Cathleen Fears office. Pt agrees with plan.

## 2014-05-10 NOTE — Patient Instructions (Signed)
Use lotrimin for rash below bra-line. Call to update Dr. Gust Brooms office to let him know you're still having trouble with your CPAP machine.  For sleep - increase depakote to 3 tablets daily. May also try seroquel 50mg  nightly to help sleep. If still having trouble, please schedule sooner appointment with Dr. Annitta Jersey for follow up (psychiatry).

## 2014-05-10 NOTE — Assessment & Plan Note (Signed)
Latest depakote level 2 mo ago low. Will increase to depakote 1500mg  ER nightly. Discussed addition of seroquel for sleep/bedtime as well. Suggested f/u with psych if not improved with our treatment. Lab Results  Component Value Date   ALT 24 04/05/2013   AST 24 04/05/2013   ALKPHOS 89 04/05/2013   BILITOT 0.4 04/05/2013    Lab Results  Component Value Date   VALPROATE <12.5* 03/10/2014

## 2014-05-10 NOTE — Progress Notes (Signed)
BP 156/90  Pulse 68  Temp(Src) 98.3 F (36.8 C) (Oral)  Wt 190 lb 8 oz (86.41 kg)   CC: trouble sleeping  Subjective:    Patient ID: Gina [name], female    DOB: September 29, 1950, 63 y.o.   MRN: [id number]  HPI: Gina [name] is a 63 y.o. female presenting on 05/10/2014 for Insomnia   Presents with sister today. Sister thinks her bipolar is acting up. Pt endorses mind is racing more. Having stress living with sister. 3d h/o of trouble sleeping - staying up at night. When she lays down unable to fall sleep. Not necessarily that she feels she can go days without sleep, no increased goal-oriented activity. Sister says she's more "grumpy"  Pt thinks old CPAP mask not letting her sleep well. Feels some air leak around mouth. So she has not used machine as frequently as she should. Has been unable to get this resolved through Dr. Gust Brooms office so far. He sees her for OSA.  Bedtime routine hasn't changed.   Psychiatrist is Dr. Darletta Moll at [facility]. Next appt is end of October.  Relevant past medical, surgical, family and social history reviewed and updated as indicated.  Allergies and medications reviewed and updated. Current Outpatient Prescriptions on File Prior to Visit  Medication Sig  . cholecalciferol (VITAMIN D) 400 UNITS TABS Take 400 Units by mouth daily.  Marland Kitchen docusate sodium (COLACE) 100 MG capsule Take 100 mg by mouth at bedtime.   Marland Kitchen FLUoxetine (PROZAC) 40 MG capsule Take 1 capsule (40 mg total) by mouth daily.  Marland Kitchen lovastatin (MEVACOR) 40 MG tablet TAKE 1 TABLET (40 MG TOTAL) BY MOUTH AT BEDTIME.  Marland Kitchen senna-docusate (SENOKOT-S) 8.6-50 MG per tablet Take 1 tablet by mouth daily as needed for mild constipation.  Marland Kitchen oxybutynin (DITROPAN) 5 MG tablet Take 1 tablet (5 mg total) by mouth 2 (two) times daily. Bladder control   No current facility-administered medications on file prior to visit.    Review of Systems Per HPI unless specifically indicated above    Objective:      BP 156/90  Pulse 68  Temp(Src) 98.3 F (36.8 C) (Oral)  Wt 190 lb 8 oz (86.41 kg)  Physical Exam  Nursing note and vitals reviewed. Constitutional: She appears well-developed and well-nourished. No distress.  obese  HENT:  Mouth/Throat: Oropharynx is clear and moist. No oropharyngeal exudate.  Cardiovascular: Normal rate, regular rhythm, normal heart sounds and intact distal pulses.   No murmur heard. Pulmonary/Chest: Effort normal and breath sounds normal. No respiratory distress. She has no wheezes. She has no rales.  Musculoskeletal: She exhibits no edema.  Skin: Skin is warm and dry. No rash noted.  Psychiatric: She has a normal mood and affect.  Somewhat difficult to follow but pleasant and cooperative with exam   Results for orders placed in visit on 03/16/14  VITAMIN D 25 HYDROXY      Result Value Ref Range   VITD 51.05  30.00 - 100.00 ng/mL  RENAL FUNCTION PANEL      Result Value Ref Range   Sodium 138  135 - 145 mEq/L   Potassium 5.1  3.5 - 5.1 mEq/L   Chloride 102  96 - 112 mEq/L   CO2 30  19 - 32 mEq/L   Calcium 9.5  8.4 - 10.5 mg/dL   Albumin 3.5  3.5 - 5.2 g/dL   BUN 29 (*) 6 - 23 mg/dL   Creatinine, Ser 1.2  0.4 - 1.2 mg/dL  Glucose, Bld 187 (*) 70 - 99 mg/dL   Phosphorus 3.6  2.3 - 4.6 mg/dL   GFR 48.11 (*) >60.00 mL/min  TSH      Result Value Ref Range   TSH 4.47  0.35 - 4.50 uIU/mL      Assessment & Plan:   Problem List Items Addressed This Visit   Trouble in sleeping - Primary     Doubt bipolar related as she's not necessarily describing mania/hypomania. I did recommended she increase depakote to 1500mg  ER nightly. If this doesn't help, may add seroquel 50mg  at bedtime. Both meds sent to pharmacy. I asked her to again touch base with Dr. Gust Brooms office re new CPAP machine or mask fitting. If not better or if manic sxs developing - rec f/u sooner with Dr Cathleen Fears office. Pt agrees with plan.    OSA on CPAP     Pt to f/u with pulm Dr.  [name].    Bipolar 1 disorder      Latest depakote level 2 mo ago low. Will increase to depakote 1500mg  ER nightly. Discussed addition of seroquel for sleep/bedtime as well. Suggested f/u with psych if not improved with our treatment. Lab Results  Component Value Date   ALT 24 04/05/2013   AST 24 04/05/2013   ALKPHOS 89 04/05/2013   BILITOT 0.4 04/05/2013    Lab Results  Component Value Date   VALPROATE <12.5* 03/10/2014          Follow up plan: Return if symptoms worsen or fail to improve.

## 2014-05-10 NOTE — Progress Notes (Signed)
Pre visit review using our clinic review tool, if applicable. No additional management support is needed unless otherwise documented below in the visit note. 

## 2014-05-10 NOTE — Assessment & Plan Note (Signed)
Pt to f/u with pulm Dr. [name].

## 2014-05-16 ENCOUNTER — Inpatient Hospital Stay: Payer: Self-pay | Admitting: Psychiatry

## 2014-05-16 DIAGNOSIS — F17200 Nicotine dependence, unspecified, uncomplicated: Secondary | ICD-10-CM | POA: Diagnosis not present

## 2014-05-16 DIAGNOSIS — F3160 Bipolar disorder, current episode mixed, unspecified: Secondary | ICD-10-CM | POA: Diagnosis present

## 2014-05-16 DIAGNOSIS — G4733 Obstructive sleep apnea (adult) (pediatric): Secondary | ICD-10-CM | POA: Diagnosis not present

## 2014-05-16 DIAGNOSIS — G4700 Insomnia, unspecified: Secondary | ICD-10-CM | POA: Diagnosis present

## 2014-05-16 DIAGNOSIS — F3112 Bipolar disorder, current episode manic without psychotic features, moderate: Secondary | ICD-10-CM | POA: Diagnosis not present

## 2014-05-16 DIAGNOSIS — K5900 Constipation, unspecified: Secondary | ICD-10-CM | POA: Diagnosis present

## 2014-05-16 DIAGNOSIS — F3113 Bipolar disorder, current episode manic without psychotic features, severe: Secondary | ICD-10-CM | POA: Diagnosis not present

## 2014-05-16 DIAGNOSIS — I10 Essential (primary) hypertension: Secondary | ICD-10-CM | POA: Diagnosis present

## 2014-05-16 DIAGNOSIS — F3110 Bipolar disorder, current episode manic without psychotic features, unspecified: Secondary | ICD-10-CM | POA: Diagnosis not present

## 2014-05-16 DIAGNOSIS — B379 Candidiasis, unspecified: Secondary | ICD-10-CM | POA: Diagnosis present

## 2014-05-16 DIAGNOSIS — R7309 Other abnormal glucose: Secondary | ICD-10-CM | POA: Diagnosis present

## 2014-05-16 LAB — COMPREHENSIVE METABOLIC PANEL
ALBUMIN: 3.6 g/dL (ref 3.4–5.0)
ALT: 61 U/L
Alkaline Phosphatase: 110 U/L
Anion Gap: 9 (ref 7–16)
BILIRUBIN TOTAL: 0.4 mg/dL (ref 0.2–1.0)
BUN: 29 mg/dL — ABNORMAL HIGH (ref 7–18)
CHLORIDE: 103 mmol/L (ref 98–107)
CO2: 28 mmol/L (ref 21–32)
CREATININE: 1.37 mg/dL — AB (ref 0.60–1.30)
Calcium, Total: 9.2 mg/dL (ref 8.5–10.1)
EGFR (Non-African Amer.): 41 — ABNORMAL LOW
GFR CALC AF AMER: 50 — AB
Glucose: 125 mg/dL — ABNORMAL HIGH (ref 65–99)
Osmolality: 287 (ref 275–301)
Potassium: 4.5 mmol/L (ref 3.5–5.1)
SGOT(AST): 51 U/L — ABNORMAL HIGH (ref 15–37)
Sodium: 140 mmol/L (ref 136–145)
Total Protein: 7.8 g/dL (ref 6.4–8.2)

## 2014-05-16 LAB — CBC WITH DIFFERENTIAL/PLATELET
Basophil #: 0 10*3/uL (ref 0.0–0.1)
Basophil %: 0.4 %
EOS ABS: 0 10*3/uL (ref 0.0–0.7)
Eosinophil %: 0.6 %
HCT: 38.2 % (ref 35.0–47.0)
HGB: 12.5 g/dL (ref 12.0–16.0)
Lymphocyte #: 1.4 10*3/uL (ref 1.0–3.6)
Lymphocyte %: 21.2 %
MCH: 30.2 pg (ref 26.0–34.0)
MCHC: 32.8 g/dL (ref 32.0–36.0)
MCV: 92 fL (ref 80–100)
MONOS PCT: 10.2 %
Monocyte #: 0.7 x10 3/mm (ref 0.2–0.9)
Neutrophil #: 4.3 10*3/uL (ref 1.4–6.5)
Neutrophil %: 67.6 %
Platelet: 162 10*3/uL (ref 150–440)
RBC: 4.14 10*6/uL (ref 3.80–5.20)
RDW: 13.3 % (ref 11.5–14.5)
WBC: 6.4 10*3/uL (ref 3.6–11.0)

## 2014-05-16 LAB — VALPROIC ACID LEVEL: VALPROIC ACID: 96 ug/mL

## 2014-05-16 LAB — TSH: THYROID STIMULATING HORM: 3.12 u[IU]/mL

## 2014-05-18 LAB — URINALYSIS, COMPLETE
BACTERIA: NONE SEEN
Bilirubin,UR: NEGATIVE
Glucose,UR: NEGATIVE mg/dL (ref 0–75)
Hyaline Cast: 2
KETONE: NEGATIVE
LEUKOCYTE ESTERASE: NEGATIVE
Nitrite: NEGATIVE
Ph: 5 (ref 4.5–8.0)
Protein: NEGATIVE
RBC,UR: <1 /HPF (ref 0–5)
Specific Gravity: 1.015 (ref 1.003–1.030)
Squamous Epithelial: NONE SEEN
WBC UR: 3 /HPF (ref 0–5)

## 2014-05-18 LAB — DRUG SCREEN, URINE
AMPHETAMINES, UR SCREEN: NEGATIVE (ref ?–1000)
Barbiturates, Ur Screen: NEGATIVE (ref ?–200)
Benzodiazepine, Ur Scrn: NEGATIVE (ref ?–200)
Cannabinoid 50 Ng, Ur ~~LOC~~: NEGATIVE (ref ?–50)
Cocaine Metabolite,Ur ~~LOC~~: NEGATIVE (ref ?–300)
MDMA (ECSTASY) UR SCREEN: NEGATIVE (ref ?–500)
Methadone, Ur Screen: NEGATIVE (ref ?–300)
OPIATE, UR SCREEN: NEGATIVE (ref ?–300)
Phencyclidine (PCP) Ur S: NEGATIVE (ref ?–25)
TRICYCLIC, UR SCREEN: NEGATIVE (ref ?–1000)

## 2014-05-27 LAB — LIPID PANEL
CHOLESTEROL: 172 mg/dL (ref 0–200)
HDL Cholesterol: 35 mg/dL — ABNORMAL LOW (ref 40–60)
Ldl Cholesterol, Calc: 100 mg/dL (ref 0–100)
Triglycerides: 185 mg/dL (ref 0–200)
VLDL Cholesterol, Calc: 37 mg/dL (ref 5–40)

## 2014-05-27 LAB — HEMOGLOBIN A1C: HEMOGLOBIN A1C: 6.1 % (ref 4.2–6.3)

## 2014-05-31 ENCOUNTER — Telehealth: Payer: Self-pay | Admitting: Family Medicine

## 2014-05-31 DIAGNOSIS — G4733 Obstructive sleep apnea (adult) (pediatric): Secondary | ICD-10-CM | POA: Diagnosis not present

## 2014-05-31 DIAGNOSIS — F3110 Bipolar disorder, current episode manic without psychotic features, unspecified: Secondary | ICD-10-CM | POA: Diagnosis not present

## 2014-05-31 NOTE — Telephone Encounter (Signed)
Pt was sleeping when I called so I advise pt's sister that Dr. Darnell Level has left the office for today and him or his assistant will call her back tomorrow to f/u on her CAN message

## 2014-05-31 NOTE — Telephone Encounter (Signed)
I will forward this to Dr Darnell Level -it sounds like her appt is set up -- I do not see any records of hosp in the chart (that is usually the case with psych admits)- so have her bring any discharge info to her appt with him and keep in close contact with her psychiatrist [facility] she is feeling better

## 2014-05-31 NOTE — Telephone Encounter (Signed)
Agree. plz have her keep appt for next week, bring all discharge paperwork, and request records from hospitalization. Thanks.

## 2014-05-31 NOTE — Telephone Encounter (Signed)
Patient Information:  Caller Name: Gina [name]  Phone: [phone]  Patient: Gina [name], Gina [name]  Gender: Female  DOB: Jul 21, 1951  Age: 63 Years  PCP: [facility])  Office Follow Up:  Does the office need to follow up with this patient?: Yes  Instructions For The Office: Requesting to review medications.   Symptoms  Reason For Call & Symptoms: Gina [name] states she was discharged from a [facility] on 05/29/14. Does not know name of facility. Had been hospitalized x 2 weeks with Psychiatric issues. States she had F/U appt with Psychiatrist on 05/31/14. Gina [name] states she was instructed to call  her PCP to review her current mediations. Please call Gina [name] at [phone] Also, wants to confirm appt for next week. Per Epic Gina [name] has appointment on 06/07/14 at 09:00 with Dr. Danise Mina.  Reviewed Health History In EMR: Yes  Reviewed Medications In EMR: Yes  Reviewed Allergies In EMR: Yes  Reviewed Surgeries / Procedures: Yes  Date of Onset of Symptoms: Unknown  Guideline(s) Used:  No Protocol Available - Information Only  Disposition Per Guideline:   Discuss with PCP and Callback by Nurse Today  Reason For Disposition Reached:   Nursing judgment  Advice Given:  N/A  Patient Will Follow Care Advice:  YES

## 2014-06-01 NOTE — Telephone Encounter (Signed)
Patient is asking for a return call.  Patient said if she can't get to her phone, leave a message on her voice mail.

## 2014-06-01 NOTE — Telephone Encounter (Signed)
Patient wanted to discuss her medications. I advised her to continue all of the meds that the hospital put her on until her follow up and Dr. Darnell Level would go over them in detail at her follow up. She verbalized understanding. Records requested from [facility] (this is where patient "thinks" she was)

## 2014-06-07 ENCOUNTER — Encounter: Payer: Self-pay | Admitting: Family Medicine

## 2014-06-07 ENCOUNTER — Ambulatory Visit (INDEPENDENT_AMBULATORY_CARE_PROVIDER_SITE_OTHER): Payer: Medicare Other | Admitting: Family Medicine

## 2014-06-07 VITALS — BP 134/82 | HR 76 | Temp 97.8°F | Wt 192.5 lb

## 2014-06-07 DIAGNOSIS — I10 Essential (primary) hypertension: Secondary | ICD-10-CM

## 2014-06-07 DIAGNOSIS — R7303 Prediabetes: Secondary | ICD-10-CM

## 2014-06-07 DIAGNOSIS — E785 Hyperlipidemia, unspecified: Secondary | ICD-10-CM

## 2014-06-07 DIAGNOSIS — E1121 Type 2 diabetes mellitus with diabetic nephropathy: Secondary | ICD-10-CM | POA: Insufficient documentation

## 2014-06-07 DIAGNOSIS — E1165 Type 2 diabetes mellitus with hyperglycemia: Secondary | ICD-10-CM

## 2014-06-07 DIAGNOSIS — F319 Bipolar disorder, unspecified: Secondary | ICD-10-CM

## 2014-06-07 DIAGNOSIS — IMO0002 Reserved for concepts with insufficient information to code with codable children: Secondary | ICD-10-CM

## 2014-06-07 HISTORY — DX: Type 2 diabetes mellitus with hyperglycemia: E11.65

## 2014-06-07 HISTORY — DX: Reserved for concepts with insufficient information to code with codable children: IMO0002

## 2014-06-07 MED ORDER — METOPROLOL SUCCINATE ER 25 MG PO TB24: 75.0000 mg | ORAL_TABLET | Freq: Every day | ORAL | Status: DC

## 2014-06-07 NOTE — Assessment & Plan Note (Signed)
New dx at hospital, started on toprol XL. Continue 75mg  dose, refilled today.

## 2014-06-07 NOTE — Assessment & Plan Note (Signed)
Lovastatin was stopped in hospital. Mildly elevated in the past, will monitor off med.

## 2014-06-07 NOTE — Assessment & Plan Note (Signed)
New dx in hospital, I decreased metformin to 500mg  QAM. Pt agrees. Check A1c next labs.

## 2014-06-07 NOTE — Progress Notes (Signed)
Pre visit review using our clinic review tool, if applicable. No additional management support is needed unless otherwise documented below in the visit note. 

## 2014-06-07 NOTE — Patient Instructions (Signed)
Keep appointment with psychiatry later this month. Continue medicines as up to now. Decrease metformin to once daily with breakfast. Let us know if any questions. Return in 4 months for follow up visit, sooner if needed.

## 2014-06-07 NOTE — Progress Notes (Signed)
BP 134/82 mmHg  Pulse 76  Temp(Src) 97.8 F (36.6 C) (Oral)  Wt 192 lb 8 oz (87.317 kg)   CC: hosp f/u visit  Subjective:    Patient ID: Gina [name], female    DOB: 18-Jan-1951, 63 y.o.   MRN: [id number]  HPI: Gina [name] is a 63 y.o. female presenting on 06/07/2014 for Follow-up   D/C summary from [facility] available and reviewed.  Recent hospitalization at [facility] for manic episode - sent to ER after eval by psych. I had seen her 10/7 with increase in depakote to 1500mg  ER and addition of seroquel 50mg  nightly. This did not help. She was discharged on rispedal 3mg  bid, depakote ER 1500mg  QHS, vistaril 50mg  qhs prn insomnia.  Seroquel was ineffective so this was discontinued. Vistaril is helping insomnia but she is not using CPAP machine.  She was also found to have elevated blood pressures - metoprolol succinate XL was started at 75mg  daily. Requests refill. A1c returned at 6.1% - started on metformin 500mg  bid. Abdomen/breast candidiasis - started on nystatin powder. This is working well for her Lovastatin was stopped She stopped oxybutynin - no longer having bladder trouble.  Will schedule f/u with new psych.   Admission: 05/16/2014 Discharge: 05/29/2014 F/u phone call: 05/31/2014   Relevant past medical, surgical, family and social history reviewed and updated as indicated.  Allergies and medications reviewed and updated. Current Outpatient Prescriptions on File Prior to Visit  Medication Sig  . cholecalciferol (VITAMIN D) 400 UNITS TABS Take 400 Units by mouth daily.  . divalproex (DEPAKOTE ER) 500 MG 24 hr tablet Take 3 tablets (1,500 mg total) by mouth at bedtime.  . senna-docusate (SENOKOT-S) 8.6-50 MG per tablet Take 1 tablet by mouth daily as needed for mild constipation.   No current facility-administered medications on file prior to visit.    Review of Systems Per HPI unless specifically indicated above    Objective:    BP 134/82 mmHg  Pulse 76   Temp(Src) 97.8 F (36.6 C) (Oral)  Wt 192 lb 8 oz (87.317 kg)  Physical Exam  Constitutional: She appears well-developed and well-nourished. No distress.  HENT:  Mouth/Throat: Oropharynx is clear and moist. No oropharyngeal exudate.  Cardiovascular: Normal rate, regular rhythm, normal heart sounds and intact distal pulses.   No murmur heard. Pulmonary/Chest: Effort normal and breath sounds normal. No respiratory distress. She has no wheezes. She has no rales.  Skin: No rash noted.  Healing candidal rash  Psychiatric: She has a normal mood and affect.  Pleasant, cooperative   Nursing note and vitals reviewed.  Results for orders placed or performed in visit on 03/16/14  Vit D  25 hydroxy (rtn osteoporosis monitoring)  Result Value Ref Range   VITD 51.05 30.00 - 100.00 ng/mL  Renal function panel  Result Value Ref Range   Sodium 138 135 - 145 mEq/L   Potassium 5.1 3.5 - 5.1 mEq/L   Chloride 102 96 - 112 mEq/L   CO2 30 19 - 32 mEq/L   Calcium 9.5 8.4 - 10.5 mg/dL   Albumin 3.5 3.5 - 5.2 g/dL   BUN 29 (H) 6 - 23 mg/dL   Creatinine, Ser 1.2 0.4 - 1.2 mg/dL   Glucose, Bld 187 (H) 70 - 99 mg/dL   Phosphorus 3.6 2.3 - 4.6 mg/dL   GFR 48.11 (L) >60.00 mL/min  TSH  Result Value Ref Range   TSH 4.47 0.35 - 4.50 uIU/mL      Assessment &  Plan:   Problem List Items Addressed This Visit    Prediabetes - Primary    New dx in hospital, I decreased metformin to 500mg  QAM. Pt agrees. Check A1c next labs.    Hyperlipidemia    Lovastatin was stopped in hospital. Mildly elevated in the past, will monitor off med.    Relevant Medications      metoprolol succinate (TOPROL-XL) 24 hr tablet   HTN (hypertension)    New dx at hospital, started on toprol XL. Continue 75mg  dose, refilled today.    Relevant Medications      metoprolol succinate (TOPROL-XL) 24 hr tablet   Bipolar 1 disorder    Stable on new regimen. Continue depakote, prozac and risperdal. F/u with psych.        Follow  up plan: Return in about 4 months (around 10/06/2014), or as needed, for annual exam, prior fasting for blood work.

## 2014-06-07 NOTE — Assessment & Plan Note (Signed)
Stable on new regimen. Continue depakote, prozac and risperdal. F/u with psych.

## 2014-06-08 ENCOUNTER — Telehealth: Payer: Self-pay | Admitting: Family Medicine

## 2014-06-08 NOTE — Telephone Encounter (Signed)
emmi mailed  °

## 2014-06-09 ENCOUNTER — Telehealth: Payer: Self-pay | Admitting: Family Medicine

## 2014-06-09 NOTE — Telephone Encounter (Signed)
Patient Information:  Caller Name: Jessa  Phone: [phone]  Patient: Gina [name], Gina [name]  Gender: Female  DOB: 11-May-1951  Age: 62 Years  PCP: [facility])  Office Follow Up:  Does the office need to follow up with this patient?: YES  Instructions For The Office: No appointments available. PLEASE CALL PATIENT IF SHE CAN BE WORKED IN. ELAM IS BOOKED FOR 06/10/14.    Symptoms  Reason For Call & Symptoms: L leg gave out while taking a shower today and she slipped and fell onto the floor and hit L knee and L lower arm on the floor. L knee is swollen and has pain below the knee and L knee feels unstable. She is using cane today to support that leg. She has two small bruises on outer R side of the L knee. She has not taken any medication for pain. Advised ES Tylenol 2 tabs PO every 6 hours and to use ice for 20 min every few hours for nest 48 hours. She is resting and elevating the leg.   Reviewed Health History In EMR: Yes  Reviewed Medications In EMR: Yes  Reviewed Allergies In EMR: Yes  Reviewed Surgeries / Procedures: Yes  Date of Onset of Symptoms: 06/09/2014  Guideline(s) Used:  Knee Injury  Disposition Per Guideline:   See Today or Tomorrow in Office  Reason For Disposition Reached:   Large swelling or bruise and size > palm of person's hand  Advice Given:  Reassurance - Bending or Twisting Injury (Strain, Sprain):  Strain and sprain are the medical terms used to describe over-stretching of the muscles and ligaments of the knee. A twisting or bending injury can cause a strain or sprain.  Apply a Cold Pack:  Apply a cold pack or an ice bag (wrapped in a moist towel) to the area for 20 minutes. Repeat in 1 hour, then every 4 hours while awake.  Continue this for the first 48 hours after an injury.  This will help decrease pain and swelling.  Apply Heat to the Area:  Beginning 48 hours after an injury, apply a warm washcloth or heating pad for 10  minutes three times a day.  This will help increase blood flow and improve healing.  Wrap with an Elastic Bandage:  Wrap the injured part with a snug, elastic bandage for 48 hours.  The pressure from the bandage can make it feel better and help prevent swelling.  If your start to get numbness or tingling of your foot or toes, the bandage may be too tight. Loosen the bandage wrap.  Rest vs. Movement:  Movement is generally more healing in the long term than rest.  Continue normal activities (like walking) as much as your pain permits.  Avoid running and active sports for 1-2 weeks or until the pain and swelling are gone.  Complete rest should only be used for the first day or two after an injury. If it really hurts too much to walk, you will need to see the doctor.  Expected Course:  Pain, swelling, and bruising usually start to get better 2 to 3 days after an injury.  Swelling most often is gone after 1 week.  Bruises fade away slowly over 1-2 weeks.  It may take 2 weeks for pain and tenderness of the injured area to go away.  Call Back If:  Pain becomes severe  Pain does not improve after 3 days  Pain or swelling lasts more than 2 weeks  You  become worse.  Patient Will Follow Care Advice: No appointments available. Given information for UC if she cannot be seen in the office.

## 2014-06-09 NOTE — Telephone Encounter (Signed)
Pt notified of Dr. Marliss Coots comments/recommendation. appt scheduled with Dr. Lorelei Pont Note forwarded to Dr. Darnell Level so he is aware

## 2014-06-09 NOTE — Telephone Encounter (Signed)
I recommend elevation and ice over the weekend (cold compress 10 min at a time as often as possible)  Tylenol for pain / if she can take ibuprofen otc without side eff that may also help  Make appt with Dr Lorelei Pont next week for eval (sport med)  If severe pain however-go to UC

## 2014-06-10 NOTE — Telephone Encounter (Addendum)
Noted. Agree. Thanks. plz call in am - I have slots Monday afternoon - may place there if pt desires.

## 2014-06-12 ENCOUNTER — Ambulatory Visit (INDEPENDENT_AMBULATORY_CARE_PROVIDER_SITE_OTHER)
Admission: RE | Admit: 2014-06-12 | Discharge: 2014-06-12 | Disposition: A | Discharge status: Home / Self Care | Payer: Medicare Other | Source: Ambulatory Visit | Attending: Family Medicine | Admitting: Family Medicine

## 2014-06-12 ENCOUNTER — Ambulatory Visit (INDEPENDENT_AMBULATORY_CARE_PROVIDER_SITE_OTHER): Payer: Medicare Other | Admitting: Family Medicine

## 2014-06-12 ENCOUNTER — Encounter: Payer: Self-pay | Admitting: Family Medicine

## 2014-06-12 VITALS — BP 104/60 | HR 50 | Temp 97.5°F | Ht 61.5 in | Wt 191.5 lb

## 2014-06-12 DIAGNOSIS — M25462 Effusion, left knee: Secondary | ICD-10-CM | POA: Diagnosis not present

## 2014-06-12 DIAGNOSIS — S8992XA Unspecified injury of left lower leg, initial encounter: Secondary | ICD-10-CM | POA: Diagnosis not present

## 2014-06-12 DIAGNOSIS — M25562 Pain in left knee: Secondary | ICD-10-CM | POA: Diagnosis not present

## 2014-06-12 NOTE — Progress Notes (Signed)
Dr. Frederico Hamman T. Arslan Kier, MD, [facility] [place], [address] Phone: [phone] Fax: [phone]  06/12/2014  Patient: Gina [name], MRN: [id number], DOB: [birth year], 63 y.o.  Primary Physician:  Ria Bush, MD  Chief Complaint: Fall and Knee Pain  Subjective:   Gina [name] is a 63 y.o. very pleasant female patient who presents with the following:  [place] when she got out of the tub, and thought that knee was hurt, and knee flipped underneath her. Slipped and foot and knee went under. [place] outside of tub.   Since she fell last week, the patient has had an effusion in her LEFT knee, she has some bruising anteriorly, and she also has some difficulty with movement.  She does have difficulty with movement at baseline, and she has no significant arthritis.  She is walking with a cane, though she forgot that today in the office.  She has not had any buckling or mechanical locking up or giving way in the knee.  Past Medical History, Surgical History, Social History, Family History, Problem List, Medications, and Allergies have been reviewed and updated if relevant.  GEN: No fevers, chills. Nontoxic. Primarily MSK c/o today. MSK: Detailed in the HPI GI: tolerating PO intake without difficulty Neuro: No numbness, parasthesias, or tingling associated. Otherwise the pertinent positives of the ROS are noted above.   Objective:   BP 104/60 mmHg  Pulse 50  Temp(Src) 97.5 F (36.4 C) (Oral)  Ht 5' 1.5" (1.562 m)  Wt 191 lb 8 oz (86.864 kg)  BMI 35.60 kg/m2   GEN: WDWN, NAD, Non-toxic, Alert & Oriented x 3 HEENT: Atraumatic, Normocephalic.  Ears and Nose: No external deformity. EXTR: No clubbing/cyanosis/edema PSYCH: Normally interactive. Conversant. Not depressed or anxious appearing.  Calm demeanor.    LEFT knee: Patient lacks 5 of extension and she is able to flex to approximately 95-100.  Moderate  effusion.  Stable medial collateral ligament and lateral collateral ligament.  Anterior drawer testing is negative.  Mildly tender to palpation medially at the distal femur and proximal tibia.  Other special testing is limited.  Radiology: Dg Knee Complete 4 Views Left  06/12/2014   CLINICAL DATA:  Left knee pain after fall  EXAM: LEFT KNEE - COMPLETE 4+ VIEW  COMPARISON:  04/08/2013  FINDINGS: Five views of left knee submitted. Again noted extensive tricompartment degenerative changes. Again noted spurring of femoral condyles and tibial plateau. Significant spurring of patella. Significant narrowing of patellofemoral joint space. Small joint effusion. No acute fracture or subluxation. Diffuse narrowing of joint space most significant medial compartment.  IMPRESSION: No acute fracture or subluxation. Extensive osteoarthritic changes as described above. Small joint effusion.   Electronically Signed   By: Lahoma Crocker M.D.   On: 06/12/2014 10:57     Assessment and Plan:   Left knee pain - Plan: DG Knee Complete 4 Views Left  >25 minutes spent in face to face time with patient, >50% spent in counselling or coordination of care:  Dramatic worsening of osteoarthritis with end-stage degenerative joint disease in the LEFT knee film.  She has no evidence for occult fracture.  Significant worsening in 12 months.  Likely combination of soft tissue contusion, bone contusion and arthritis exacerbation.  Anti-inflammatories with rest and use of cane.  If symptoms persist, we can do a corticosteroid injection.  From a surgical standpoint, I do not think anything short of the total knee replacement would be  of any use to the patient.  Follow-up: f no improvement  New Prescriptions   No medications on file   Orders Placed This Encounter  Procedures  . DG Knee Complete 4 Views Left    Signed,  Undra Trembath T. Nila Winker, MD   Patient's Medications  New Prescriptions   No medications on file  Previous  Medications   CHOLECALCIFEROL (VITAMIN D) 400 UNITS TABS    Take 400 Units by mouth daily.   DIVALPROEX (DEPAKOTE ER) 500 MG 24 HR TABLET    Take 3 tablets (1,500 mg total) by mouth at bedtime.   FLUOXETINE (PROZAC) 20 MG CAPSULE    Take 20 mg by mouth daily.   HYDROXYZINE (ATARAX/VISTARIL) 50 MG TABLET    Take 50 mg by mouth at bedtime as needed (insomnia).   METFORMIN (GLUCOPHAGE) 500 MG TABLET    Take 500 mg by mouth daily with breakfast.    METOPROLOL SUCCINATE (TOPROL-XL) 25 MG 24 HR TABLET    Take 3 tablets (75 mg total) by mouth daily.   NASAL SPRAY SALINE NA    Place 2 sprays into the nose 2 (two) times daily.   NYSTATIN (MYCOSTATIN/NYSTOP) 100000 UNIT/GM POWD    Apply topically 3 (three) times daily.   RISPERIDONE (RISPERDAL) 3 MG TABLET    Take 3 mg by mouth 2 (two) times daily.   SENNA-DOCUSATE (SENOKOT-S) 8.6-50 MG PER TABLET    Take 1 tablet by mouth daily as needed for mild constipation.  Modified Medications   No medications on file  Discontinued Medications   LOVASTATIN (MEVACOR) 40 MG TABLET    Take by mouth.   QUETIAPINE (SEROQUEL) 50 MG TABLET

## 2014-06-12 NOTE — Progress Notes (Signed)
Pre visit review using our clinic review tool, if applicable. No additional management support is needed unless otherwise documented below in the visit note. 

## 2014-06-12 NOTE — Telephone Encounter (Signed)
I didn't get a chance to call patient before all open slots for you were filled. I'm sorry!

## 2014-06-27 DIAGNOSIS — I10 Essential (primary) hypertension: Secondary | ICD-10-CM | POA: Diagnosis not present

## 2014-06-27 DIAGNOSIS — F319 Bipolar disorder, unspecified: Secondary | ICD-10-CM | POA: Diagnosis not present

## 2014-06-27 DIAGNOSIS — R7309 Other abnormal glucose: Secondary | ICD-10-CM | POA: Diagnosis not present

## 2014-06-27 DIAGNOSIS — E785 Hyperlipidemia, unspecified: Secondary | ICD-10-CM | POA: Diagnosis not present

## 2014-06-28 DIAGNOSIS — F319 Bipolar disorder, unspecified: Secondary | ICD-10-CM | POA: Diagnosis not present

## 2014-06-28 DIAGNOSIS — G4733 Obstructive sleep apnea (adult) (pediatric): Secondary | ICD-10-CM | POA: Diagnosis not present

## 2014-07-14 ENCOUNTER — Telehealth: Payer: Self-pay | Admitting: Family Medicine

## 2014-07-14 MED ORDER — METFORMIN HCL 500 MG PO TABS: 500.0000 mg | ORAL_TABLET | Freq: Every day | ORAL | Status: DC

## 2014-07-14 NOTE — Telephone Encounter (Signed)
[facility] Pt came in today needing to get a refill on metformin hcl 579m tab    Pt stated she only has 2 left

## 2014-07-14 NOTE — Telephone Encounter (Signed)
Refilled

## 2014-08-09 DIAGNOSIS — Z79899 Other long term (current) drug therapy: Secondary | ICD-10-CM | POA: Diagnosis not present

## 2014-08-09 LAB — HEPATIC FUNCTION PANEL
ALT: 20
ALT: 20 U/L (ref 7–35)
AST: 26 U/L
AST: 26 U/L (ref 13–35)
Albumin: 3.8
Alkaline Phosphatase: 78 U/L
Alkaline Phosphatase: 78 U/L (ref 25–125)
Bilirubin: 0.3

## 2014-08-09 LAB — CBC AND DIFFERENTIAL
HCT: 34 % — AB (ref 36–46)
Hemoglobin: 11.8 g/dL — AB (ref 12.0–16.0)
Neutrophils Absolute: 54 /uL
Platelets: 106 10*3/uL — AB (ref 150–399)
WBC: 5.8 10*3/mL

## 2014-08-09 LAB — VALPROIC ACID LEVEL: Valproic Acid Lvl: 113

## 2014-08-09 LAB — LIPID PANEL
CHOLESTEROL: 247 mg/dL — AB (ref 0–200)
Cholesterol, Total: 247
HDL: 40 mg/dL (ref 35–70)
HDL: 40 mg/dL (ref 35–70)
LDL (calc): 169
TRIGLYCERIDES: 192
TRIGLYCERIDES: 192 mg/dL — AB (ref 40–160)

## 2014-08-09 LAB — CBC
HEMOGLOBIN: 11.8 g/dL
WBC: 5.8
platelet count: 106

## 2014-08-09 LAB — GLUCOSE, FASTING: Glucose: 101

## 2014-08-09 LAB — BASIC METABOLIC PANEL: Glucose: 101 mg/dL

## 2014-08-11 ENCOUNTER — Telehealth: Payer: Self-pay

## 2014-08-11 DIAGNOSIS — Z1159 Encounter for screening for other viral diseases: Secondary | ICD-10-CM

## 2014-08-11 DIAGNOSIS — D696 Thrombocytopenia, unspecified: Secondary | ICD-10-CM

## 2014-08-11 NOTE — Telephone Encounter (Signed)
Pt said her psychiatrist wants pt to have cholesterol and heart lift levels checked.pt has cpx labs scheduled for 10/09/2014 but pt said psychiatrist told her not to wait that long to have cholesterol and heart lift levels rechecked. Pt is not sure what heart lift level is but was sure that is what the doctor said. Pt will have psychiatrist fax copy of labs to Dr G at [phone].

## 2014-08-12 MED ORDER — LOVASTATIN 40 MG PO TABS: 40.0000 mg | ORAL_TABLET | Freq: Every day | ORAL | Status: DC

## 2014-08-12 NOTE — Telephone Encounter (Signed)
Reviewed labs - TC 247, Trig 192, LDL 169, HDL 40, plt 106. CBC o/w wnl, LFTs WNL, glu 101 Anticipate "heart lift" is actually platelets. plt previously 140s, steady decline over last year. Let's recheck CBC in 2 weeks. For chol levels, let's restart lovastatin 40mg  nightly - sent to pharmacy. This was previously stopped during hospitalization 06/2014 unclear why.  depakote level was decreased to 1000mg  nightly 2/2 elevated levels - updated med list.

## 2014-08-14 NOTE — Telephone Encounter (Signed)
Patient notified and lab appt scheduled.  

## 2014-08-16 ENCOUNTER — Encounter: Payer: Self-pay | Admitting: Family Medicine

## 2014-08-16 ENCOUNTER — Ambulatory Visit (INDEPENDENT_AMBULATORY_CARE_PROVIDER_SITE_OTHER): Payer: Medicare Other | Admitting: Family Medicine

## 2014-08-16 ENCOUNTER — Ambulatory Visit (INDEPENDENT_AMBULATORY_CARE_PROVIDER_SITE_OTHER)
Admission: RE | Admit: 2014-08-16 | Discharge: 2014-08-16 | Disposition: A | Discharge status: Home / Self Care | Payer: Medicare Other | Source: Ambulatory Visit | Attending: Family Medicine | Admitting: Family Medicine

## 2014-08-16 VITALS — BP 110/73 | HR 57 | Temp 98.5°F | Ht 61.5 in | Wt 190.5 lb

## 2014-08-16 DIAGNOSIS — M79671 Pain in right foot: Secondary | ICD-10-CM | POA: Diagnosis not present

## 2014-08-16 DIAGNOSIS — R7309 Other abnormal glucose: Secondary | ICD-10-CM | POA: Diagnosis not present

## 2014-08-16 DIAGNOSIS — S99921A Unspecified injury of right foot, initial encounter: Secondary | ICD-10-CM | POA: Diagnosis not present

## 2014-08-16 DIAGNOSIS — R7303 Prediabetes: Secondary | ICD-10-CM

## 2014-08-16 MED ORDER — INDOMETHACIN 50 MG PO CAPS: 50.0000 mg | ORAL_CAPSULE | Freq: Three times a day (TID) | ORAL | Status: DC

## 2014-08-16 NOTE — Progress Notes (Signed)
Dr. Frederico Hamman T. Dorsie Burich, MD, [facility] [place], [address] Phone: [phone] Fax: [phone]  08/16/2014  Patient: Gina [name], MRN: [id number], DOB: 07/06/1951, 64 y.o.  Primary Physician:  Ria Bush, MD  Chief Complaint: Foot Pain and Diarrhea  Subjective:   Gina [name] is a 64 y.o. very pleasant female patient who presents with the following:  Pleasant patient with manic depressive illness who I remember well who presents primarily with RIGHT pain in her 1st MTP joint, and she additionally has diarrhea that has been ongoing for about 3 weeks.  She also had a recent hospitalization in psychiatric unit.  Over the last few days her RIGHT foot particularly the forefoot and particularly the 1st MTP is red, warm, and is quite tender.  She does have a history of gout per her report to me.  On review of the chart, it looks like the patient does not have type 2 diabetes, and she does have a new onset diagnosis of prediabetes.  She was started on metformin and titrated up to 1 tablet twice a day.  She has been having diarrhea as above.  Right foot pain.  Bad diarrhea. 3 weeks - of loose stool.  Cut down sugar pill.  New psych, Dr. Jimmye Norman.  Recent hosp.  D/c metformin.  Gout.  Past Medical History, Surgical History, Social History, Family History, Problem List, Medications, and Allergies have been reviewed and updated if relevant.  ROS: GEN: Acute illness details above GI: Tolerating PO intake GU: maintaining adequate hydration and urination Pulm: No SOB Interactive and getting along well at home.  Otherwise, ROS is as per the HPI.   Objective:   BP 110/73 mmHg  Pulse 57  Temp(Src) 98.5 F (36.9 C) (Oral)  Ht 5' 1.5" (1.562 m)  Wt 190 lb 8 oz (86.41 kg)  BMI 35.42 kg/m2   GEN: WDWN, NAD, Non-toxic, Alert & Oriented x 3 HEENT: Atraumatic, Normocephalic.  Ears and Nose: No  external deformity. EXTR: No clubbing/cyanosis/edema PSYCH: Normally interactive. Conversant. Not depressed or anxious appearing.  Calm demeanor.   FEET: R Echymosis: no Edema: no ROM: full LE B Gait: heel toe, antalgic MT pain: no Callus pattern: none Lateral Mall: NT Medial Mall: NT Talus: NT Navicular: NT Cuboid: NT Calcaneous: NT Metatarsals: NT 5th MT: NT Phalanges:  1st MTP tender to palpation and with warmth Achilles: NT Plantar Fascia: NT Fat Pad: NT Peroneals: NT Post Tib: NT Great Toe: minimal motion and pain Ant Drawer: neg ATFL: NT CFL: NT Deltoid: NT Other foot breakdown: none Long arch: preserved Transverse arch: preserved Hindfoot breakdown: none Sensation: intact    Laboratory and Imaging Data:  Assessment and Plan:   Right foot pain - Plan: DG Foot Complete Right  Prediabetes   No evidence of occult fracture on plain film.  Clinically consistent with acute gout attack.  Start 3 times a day Indocin.  Given ongoing diarrhea, I would like to avoid colchicine if possible.  Recent hospitalization in a type I bipolar patient.  I would avoid all forms of corticosteroid.  Diarrhea.  I am concerned that this may be secondary to metformin given that it is greater than 3 weeks.  She is prediabetic only.  I have recommended that she stop this to see if that makes a difference with her diarrhea.  High risk given antipsychotic use, very gentle re-titration on 2 metformin might be appropriate in the future.  Follow-up: No Follow-up on file.  New Prescriptions   INDOMETHACIN (INDOCIN) 50 MG CAPSULE    Take 1 capsule (50 mg total) by mouth 3 (three) times daily with meals.   Orders Placed This Encounter  Procedures  . DG Foot Complete Right    Signed,  Jonluke Cobbins T. Adonis Ryther, MD   Patient's Medications  New Prescriptions   INDOMETHACIN (INDOCIN) 50 MG CAPSULE    Take 1 capsule (50 mg total) by mouth 3 (three) times daily with meals.  Previous Medications     CHOLECALCIFEROL (VITAMIN D) 400 UNITS TABS    Take 400 Units by mouth daily.   DIVALPROEX (DEPAKOTE ER) 500 MG 24 HR TABLET    Take 2 tablets (1,000 mg total) by mouth at bedtime.   FLUOXETINE (PROZAC) 20 MG CAPSULE    Take 20 mg by mouth daily.   HYDROXYZINE (ATARAX/VISTARIL) 50 MG TABLET    Take 50 mg by mouth at bedtime as needed (insomnia).   LOVASTATIN (MEVACOR) 40 MG TABLET    Take 1 tablet (40 mg total) by mouth at bedtime.   METOPROLOL SUCCINATE (TOPROL-XL) 25 MG 24 HR TABLET    Take 3 tablets (75 mg total) by mouth daily.   NASAL SPRAY SALINE NA    Place 2 sprays into the nose 2 (two) times daily.   RISPERIDONE (RISPERDAL) 3 MG TABLET    Take 3 mg by mouth 2 (two) times daily.   SENNA-DOCUSATE (SENOKOT-S) 8.6-50 MG PER TABLET    Take 1 tablet by mouth daily as needed for mild constipation.  Modified Medications   No medications on file  Discontinued Medications   METFORMIN (GLUCOPHAGE) 500 MG TABLET    Take 1 tablet (500 mg total) by mouth daily with breakfast.   NYSTATIN (MYCOSTATIN/NYSTOP) 100000 UNIT/GM POWD    Apply topically 3 (three) times daily.

## 2014-08-16 NOTE — Progress Notes (Signed)
Pre visit review using our clinic review tool, if applicable. No additional management support is needed unless otherwise documented below in the visit note. 

## 2014-08-16 NOTE — Patient Instructions (Signed)
STOP meformin.

## 2014-08-23 ENCOUNTER — Telehealth: Payer: Self-pay | Admitting: *Deleted

## 2014-08-23 DIAGNOSIS — N183 Chronic kidney disease, stage 3 unspecified: Secondary | ICD-10-CM

## 2014-08-23 DIAGNOSIS — F319 Bipolar disorder, unspecified: Secondary | ICD-10-CM

## 2014-08-23 DIAGNOSIS — G4733 Obstructive sleep apnea (adult) (pediatric): Secondary | ICD-10-CM | POA: Diagnosis not present

## 2014-08-23 DIAGNOSIS — I10 Essential (primary) hypertension: Secondary | ICD-10-CM

## 2014-08-23 DIAGNOSIS — R7303 Prediabetes: Secondary | ICD-10-CM

## 2014-08-23 DIAGNOSIS — Z9989 Dependence on other enabling machines and devices: Secondary | ICD-10-CM

## 2014-08-23 DIAGNOSIS — E785 Hyperlipidemia, unspecified: Secondary | ICD-10-CM

## 2014-08-23 NOTE — Telephone Encounter (Signed)
Patient came in requesting help with her meds and CPAP. She said she is noticing herself getting very confused with her medications and knows she isn't taking them correctly and it is causing problems at home. She asks if we could possibly have [facility] come to help her like they came to help her sister. I told her that I would talk to you about referral and in the meantime, I told her to bring me all of her meds and her pill box this afternoon and I would get her set up correctly for a week. She agreed to do so.   I will call her CPAP company about the issue she is having about running out of water during the night despite filling it prior to going to sleep.

## 2014-08-28 NOTE — Telephone Encounter (Signed)
Yes she did. I got her situated for 1 week. Apparently what is happening is she has a weekly pill box and the "days" pop out of the box itself instead of it being a solid unit. So, the days were popping loose and she was getting confused as to what day she was on, what day she had taken and what day was coming up. They aren't labeled with the days of the week. I secured them all in the actual unit and told her to start with Sunday and end on Saturday. She verbalized understanding. I also spoke with CPAP company about machine.

## 2014-08-28 NOTE — Telephone Encounter (Signed)
Referral placed to home health. Did she come in last week to set up pill box?

## 2014-08-29 ENCOUNTER — Other Ambulatory Visit: Payer: Medicare Other

## 2014-08-29 DIAGNOSIS — Z79899 Other long term (current) drug therapy: Secondary | ICD-10-CM | POA: Diagnosis not present

## 2014-08-29 LAB — HEPATIC FUNCTION PANEL
ALT: 26 U/L (ref 7–35)
AST: 43 U/L — AB (ref 13–35)
Alkaline Phosphatase: 76 U/L (ref 25–125)
BILIRUBIN, TOTAL: 0.3 mg/dL

## 2014-08-30 ENCOUNTER — Other Ambulatory Visit: Payer: Self-pay | Admitting: *Deleted

## 2014-08-30 MED ORDER — METOPROLOL SUCCINATE ER 25 MG PO TB24: 75.0000 mg | ORAL_TABLET | Freq: Every day | ORAL | Status: DC

## 2014-08-31 DIAGNOSIS — I129 Hypertensive chronic kidney disease with stage 1 through stage 4 chronic kidney disease, or unspecified chronic kidney disease: Secondary | ICD-10-CM | POA: Diagnosis not present

## 2014-08-31 DIAGNOSIS — N183 Chronic kidney disease, stage 3 (moderate): Secondary | ICD-10-CM | POA: Diagnosis not present

## 2014-08-31 DIAGNOSIS — M25561 Pain in right knee: Secondary | ICD-10-CM | POA: Diagnosis not present

## 2014-08-31 DIAGNOSIS — D649 Anemia, unspecified: Secondary | ICD-10-CM | POA: Diagnosis not present

## 2014-08-31 DIAGNOSIS — Z9181 History of falling: Secondary | ICD-10-CM | POA: Diagnosis not present

## 2014-08-31 DIAGNOSIS — D696 Thrombocytopenia, unspecified: Secondary | ICD-10-CM | POA: Diagnosis not present

## 2014-08-31 DIAGNOSIS — E785 Hyperlipidemia, unspecified: Secondary | ICD-10-CM | POA: Diagnosis not present

## 2014-08-31 DIAGNOSIS — E669 Obesity, unspecified: Secondary | ICD-10-CM | POA: Diagnosis not present

## 2014-08-31 DIAGNOSIS — F3110 Bipolar disorder, current episode manic without psychotic features, unspecified: Secondary | ICD-10-CM | POA: Diagnosis not present

## 2014-08-31 DIAGNOSIS — M25562 Pain in left knee: Secondary | ICD-10-CM | POA: Diagnosis not present

## 2014-08-31 DIAGNOSIS — H524 Presbyopia: Secondary | ICD-10-CM | POA: Diagnosis not present

## 2014-08-31 DIAGNOSIS — F319 Bipolar disorder, unspecified: Secondary | ICD-10-CM | POA: Diagnosis not present

## 2014-08-31 DIAGNOSIS — Z85038 Personal history of other malignant neoplasm of large intestine: Secondary | ICD-10-CM | POA: Diagnosis not present

## 2014-08-31 DIAGNOSIS — G4733 Obstructive sleep apnea (adult) (pediatric): Secondary | ICD-10-CM | POA: Diagnosis not present

## 2014-08-31 DIAGNOSIS — H2513 Age-related nuclear cataract, bilateral: Secondary | ICD-10-CM | POA: Diagnosis not present

## 2014-09-01 ENCOUNTER — Other Ambulatory Visit (INDEPENDENT_AMBULATORY_CARE_PROVIDER_SITE_OTHER): Payer: Medicare Other

## 2014-09-01 DIAGNOSIS — Z1159 Encounter for screening for other viral diseases: Secondary | ICD-10-CM

## 2014-09-01 DIAGNOSIS — D696 Thrombocytopenia, unspecified: Secondary | ICD-10-CM | POA: Diagnosis not present

## 2014-09-01 LAB — CBC WITH DIFFERENTIAL/PLATELET
BASOS ABS: 0 10*3/uL (ref 0.0–0.1)
BASOS PCT: 0.2 % (ref 0.0–3.0)
Eosinophils Absolute: 0.1 10*3/uL (ref 0.0–0.7)
Eosinophils Relative: 0.8 % (ref 0.0–5.0)
HEMATOCRIT: 33.8 % — AB (ref 36.0–46.0)
HEMOGLOBIN: 11.5 g/dL — AB (ref 12.0–15.0)
LYMPHS PCT: 24.5 % (ref 12.0–46.0)
Lymphs Abs: 1.9 10*3/uL (ref 0.7–4.0)
MCHC: 34 g/dL (ref 30.0–36.0)
MCV: 90.3 fl (ref 78.0–100.0)
Monocytes Absolute: 0.9 10*3/uL (ref 0.1–1.0)
Monocytes Relative: 11.9 % (ref 3.0–12.0)
Neutro Abs: 4.8 10*3/uL (ref 1.4–7.7)
Neutrophils Relative %: 62.6 % (ref 43.0–77.0)
Platelets: 158 10*3/uL (ref 150.0–400.0)
RBC: 3.74 Mil/uL — ABNORMAL LOW (ref 3.87–5.11)
RDW: 13.6 % (ref 11.5–15.5)
WBC: 7.6 10*3/uL (ref 4.0–10.5)

## 2014-09-02 LAB — HEPATITIS C ANTIBODY: HCV Ab: REACTIVE — AB

## 2014-09-04 ENCOUNTER — Telehealth: Payer: Self-pay | Admitting: Family Medicine

## 2014-09-04 DIAGNOSIS — Z9989 Dependence on other enabling machines and devices: Principal | ICD-10-CM

## 2014-09-04 DIAGNOSIS — R413 Other amnesia: Secondary | ICD-10-CM | POA: Diagnosis not present

## 2014-09-04 DIAGNOSIS — G4733 Obstructive sleep apnea (adult) (pediatric): Secondary | ICD-10-CM

## 2014-09-04 DIAGNOSIS — R768 Other specified abnormal immunological findings in serum: Secondary | ICD-10-CM

## 2014-09-04 DIAGNOSIS — R4182 Altered mental status, unspecified: Secondary | ICD-10-CM | POA: Diagnosis not present

## 2014-09-04 HISTORY — DX: Other specified abnormal immunological findings in serum: R76.8

## 2014-09-04 LAB — HEPATITIS C RNA QUANTITATIVE: HCV Quantitative: NOT DETECTED IU/mL (ref <15)

## 2014-09-04 LAB — PATHOLOGIST SMEAR REVIEW

## 2014-09-04 NOTE — Telephone Encounter (Addendum)
Admitted to [facility] - request new CPAP mask Rx - will provide and also refer to pulm to establish and follow OSA.  Also ok to do MSW, PT eval.

## 2014-09-05 ENCOUNTER — Telehealth: Payer: Self-pay | Admitting: Family Medicine

## 2014-09-05 NOTE — Telephone Encounter (Signed)
Pt left v/m; pts ins is not paying for pt's BP med and pt wants to know if BP med can be changed so ins will pay. Spoke with pt and she will contact ins co to get names of approved meds that would be possible substitutes for her BP med and pt will cb with info.

## 2014-09-06 DIAGNOSIS — D696 Thrombocytopenia, unspecified: Secondary | ICD-10-CM | POA: Diagnosis not present

## 2014-09-06 DIAGNOSIS — D649 Anemia, unspecified: Secondary | ICD-10-CM | POA: Diagnosis not present

## 2014-09-06 DIAGNOSIS — I129 Hypertensive chronic kidney disease with stage 1 through stage 4 chronic kidney disease, or unspecified chronic kidney disease: Secondary | ICD-10-CM | POA: Diagnosis not present

## 2014-09-06 DIAGNOSIS — G4733 Obstructive sleep apnea (adult) (pediatric): Secondary | ICD-10-CM | POA: Diagnosis not present

## 2014-09-06 DIAGNOSIS — F319 Bipolar disorder, unspecified: Secondary | ICD-10-CM | POA: Diagnosis not present

## 2014-09-06 DIAGNOSIS — N183 Chronic kidney disease, stage 3 (moderate): Secondary | ICD-10-CM | POA: Diagnosis not present

## 2014-09-08 ENCOUNTER — Encounter: Payer: Self-pay | Admitting: Family Medicine

## 2014-09-08 ENCOUNTER — Encounter: Payer: Self-pay | Admitting: *Deleted

## 2014-09-08 ENCOUNTER — Telehealth: Payer: Self-pay | Admitting: Family Medicine

## 2014-09-08 DIAGNOSIS — D696 Thrombocytopenia, unspecified: Secondary | ICD-10-CM | POA: Diagnosis not present

## 2014-09-08 DIAGNOSIS — G4733 Obstructive sleep apnea (adult) (pediatric): Secondary | ICD-10-CM | POA: Diagnosis not present

## 2014-09-08 DIAGNOSIS — N183 Chronic kidney disease, stage 3 (moderate): Secondary | ICD-10-CM | POA: Diagnosis not present

## 2014-09-08 DIAGNOSIS — I129 Hypertensive chronic kidney disease with stage 1 through stage 4 chronic kidney disease, or unspecified chronic kidney disease: Secondary | ICD-10-CM | POA: Diagnosis not present

## 2014-09-08 DIAGNOSIS — F319 Bipolar disorder, unspecified: Secondary | ICD-10-CM | POA: Diagnosis not present

## 2014-09-08 DIAGNOSIS — D649 Anemia, unspecified: Secondary | ICD-10-CM | POA: Diagnosis not present

## 2014-09-08 NOTE — Telephone Encounter (Signed)
Spoke with [place] at [facility]. Patient has a deductible to meet before insurance covers her meds. She has a remaining deductible of ~$147 to pay prior to payments. PA will not help, nor will changing meds. Patient notified. She also asked if you could send something in for her knee pain. She said they are still bothering her from when she fell. Tylenol and pain cream are not helping.

## 2014-09-08 NOTE — Telephone Encounter (Signed)
Debbie notified. 

## 2014-09-08 NOTE — Telephone Encounter (Signed)
Debbie is,also,asking for an order for Physical Armed forces training and education officer and Environmental consultant.  Debbie said they're all home care.

## 2014-09-08 NOTE — Telephone Encounter (Signed)
[facility] called in [facility] pt.  She needs an order for in home pt and an order for in home respiratory therapy. She stated pt has a CPAP machine and needs help with that. A verbal order is fine per Jackelyn Poling.   Best number is [phone].

## 2014-09-08 NOTE — Telephone Encounter (Signed)
Debbie nurse with [facility] left v/m; pts ins is no longer covering metoprolol succinate; pt has 40 pills left. Debbie wants to know if there is substitute med could prescribe.Debbie request cb.

## 2014-09-08 NOTE — Telephone Encounter (Signed)
See other phone note. This was addressed yesterday. If PA not approved, would do metoprolol tartrate 25mg  1.5 tablets bid.

## 2014-09-08 NOTE — Telephone Encounter (Signed)
Ok to do verbal order for all below.

## 2014-09-08 NOTE — Telephone Encounter (Signed)
Can we start PA for toprol XL? I recommend extended release to simplify med regimen.

## 2014-09-09 DIAGNOSIS — I129 Hypertensive chronic kidney disease with stage 1 through stage 4 chronic kidney disease, or unspecified chronic kidney disease: Secondary | ICD-10-CM | POA: Diagnosis not present

## 2014-09-09 DIAGNOSIS — F319 Bipolar disorder, unspecified: Secondary | ICD-10-CM | POA: Diagnosis not present

## 2014-09-09 DIAGNOSIS — D696 Thrombocytopenia, unspecified: Secondary | ICD-10-CM | POA: Diagnosis not present

## 2014-09-09 DIAGNOSIS — N183 Chronic kidney disease, stage 3 (moderate): Secondary | ICD-10-CM | POA: Diagnosis not present

## 2014-09-09 DIAGNOSIS — G4733 Obstructive sleep apnea (adult) (pediatric): Secondary | ICD-10-CM | POA: Diagnosis not present

## 2014-09-09 DIAGNOSIS — D649 Anemia, unspecified: Secondary | ICD-10-CM | POA: Diagnosis not present

## 2014-09-10 NOTE — Telephone Encounter (Signed)
Would have her use indocin prescribed by Dr Lorelei Pont or aleve OTC 1 tab bid PRN with food but need to be careful with her kidneys - and would recommend increase water intake (not both together). What is pain cream she's been using? Would recommend voltaren ccream. She has significant arthritis in L knee. Would have her consider steroid injection if persistent despite NSAID.

## 2014-09-11 MED ORDER — DICLOFENAC SODIUM 1 % TD GEL: 1.0000 "application " | Freq: Three times a day (TID) | TRANSDERMAL | Status: DC

## 2014-09-11 NOTE — Telephone Encounter (Signed)
voltaren gel sent in.

## 2014-09-11 NOTE — Telephone Encounter (Signed)
Patient notified and verbalized understanding. She has been using capsaicin cream OTC. I advised you may send in voltaren gel and if that and indocin or aleve were no help, you may suggest steroid injection. She verbalized understanding.

## 2014-09-12 DIAGNOSIS — D649 Anemia, unspecified: Secondary | ICD-10-CM | POA: Diagnosis not present

## 2014-09-12 DIAGNOSIS — D696 Thrombocytopenia, unspecified: Secondary | ICD-10-CM | POA: Diagnosis not present

## 2014-09-12 DIAGNOSIS — F319 Bipolar disorder, unspecified: Secondary | ICD-10-CM | POA: Diagnosis not present

## 2014-09-12 DIAGNOSIS — I129 Hypertensive chronic kidney disease with stage 1 through stage 4 chronic kidney disease, or unspecified chronic kidney disease: Secondary | ICD-10-CM | POA: Diagnosis not present

## 2014-09-12 DIAGNOSIS — N183 Chronic kidney disease, stage 3 (moderate): Secondary | ICD-10-CM | POA: Diagnosis not present

## 2014-09-12 DIAGNOSIS — G4733 Obstructive sleep apnea (adult) (pediatric): Secondary | ICD-10-CM | POA: Diagnosis not present

## 2014-09-13 ENCOUNTER — Encounter: Payer: Self-pay | Admitting: Family Medicine

## 2014-09-13 ENCOUNTER — Encounter: Payer: Self-pay | Admitting: *Deleted

## 2014-09-14 DIAGNOSIS — F319 Bipolar disorder, unspecified: Secondary | ICD-10-CM | POA: Diagnosis not present

## 2014-09-14 DIAGNOSIS — N183 Chronic kidney disease, stage 3 (moderate): Secondary | ICD-10-CM | POA: Diagnosis not present

## 2014-09-14 DIAGNOSIS — G4733 Obstructive sleep apnea (adult) (pediatric): Secondary | ICD-10-CM | POA: Diagnosis not present

## 2014-09-14 DIAGNOSIS — D649 Anemia, unspecified: Secondary | ICD-10-CM | POA: Diagnosis not present

## 2014-09-14 DIAGNOSIS — I129 Hypertensive chronic kidney disease with stage 1 through stage 4 chronic kidney disease, or unspecified chronic kidney disease: Secondary | ICD-10-CM | POA: Diagnosis not present

## 2014-09-14 DIAGNOSIS — D696 Thrombocytopenia, unspecified: Secondary | ICD-10-CM | POA: Diagnosis not present

## 2014-09-15 ENCOUNTER — Telehealth: Payer: Self-pay

## 2014-09-15 DIAGNOSIS — G4733 Obstructive sleep apnea (adult) (pediatric): Secondary | ICD-10-CM | POA: Diagnosis not present

## 2014-09-15 DIAGNOSIS — N183 Chronic kidney disease, stage 3 (moderate): Secondary | ICD-10-CM | POA: Diagnosis not present

## 2014-09-15 DIAGNOSIS — I129 Hypertensive chronic kidney disease with stage 1 through stage 4 chronic kidney disease, or unspecified chronic kidney disease: Secondary | ICD-10-CM | POA: Diagnosis not present

## 2014-09-15 DIAGNOSIS — F319 Bipolar disorder, unspecified: Secondary | ICD-10-CM | POA: Diagnosis not present

## 2014-09-15 DIAGNOSIS — D696 Thrombocytopenia, unspecified: Secondary | ICD-10-CM | POA: Diagnosis not present

## 2014-09-15 DIAGNOSIS — D649 Anemia, unspecified: Secondary | ICD-10-CM | POA: Diagnosis not present

## 2014-09-15 NOTE — Telephone Encounter (Signed)
Order written and placed in [facility] box. Recommend aleve 220mg  one twice daily with food as needed for pain.

## 2014-09-15 NOTE — Telephone Encounter (Signed)
Gina [name] nurse with [facility] left v/m requesting written order to have repair to cpap, order needs to state evaluate and repair cpap machine and mask faxed [facility] at fax # [phone].Pt also needs cb at [phone] to clarify what to take (aleve or ibuprofen) for pain. Please advise.

## 2014-09-15 NOTE — Telephone Encounter (Signed)
Order faxed and patient notified.

## 2014-09-17 DIAGNOSIS — D649 Anemia, unspecified: Secondary | ICD-10-CM | POA: Diagnosis not present

## 2014-09-17 DIAGNOSIS — I129 Hypertensive chronic kidney disease with stage 1 through stage 4 chronic kidney disease, or unspecified chronic kidney disease: Secondary | ICD-10-CM | POA: Diagnosis not present

## 2014-09-17 DIAGNOSIS — D696 Thrombocytopenia, unspecified: Secondary | ICD-10-CM | POA: Diagnosis not present

## 2014-09-17 DIAGNOSIS — F319 Bipolar disorder, unspecified: Secondary | ICD-10-CM | POA: Diagnosis not present

## 2014-09-17 DIAGNOSIS — G4733 Obstructive sleep apnea (adult) (pediatric): Secondary | ICD-10-CM | POA: Diagnosis not present

## 2014-09-17 DIAGNOSIS — N183 Chronic kidney disease, stage 3 (moderate): Secondary | ICD-10-CM | POA: Diagnosis not present

## 2014-09-19 DIAGNOSIS — F319 Bipolar disorder, unspecified: Secondary | ICD-10-CM | POA: Diagnosis not present

## 2014-09-19 DIAGNOSIS — I129 Hypertensive chronic kidney disease with stage 1 through stage 4 chronic kidney disease, or unspecified chronic kidney disease: Secondary | ICD-10-CM | POA: Diagnosis not present

## 2014-09-19 DIAGNOSIS — D696 Thrombocytopenia, unspecified: Secondary | ICD-10-CM | POA: Diagnosis not present

## 2014-09-19 DIAGNOSIS — G4733 Obstructive sleep apnea (adult) (pediatric): Secondary | ICD-10-CM | POA: Diagnosis not present

## 2014-09-19 DIAGNOSIS — D649 Anemia, unspecified: Secondary | ICD-10-CM | POA: Diagnosis not present

## 2014-09-19 DIAGNOSIS — N183 Chronic kidney disease, stage 3 (moderate): Secondary | ICD-10-CM | POA: Diagnosis not present

## 2014-09-20 DIAGNOSIS — F319 Bipolar disorder, unspecified: Secondary | ICD-10-CM | POA: Diagnosis not present

## 2014-09-22 DIAGNOSIS — D649 Anemia, unspecified: Secondary | ICD-10-CM | POA: Diagnosis not present

## 2014-09-22 DIAGNOSIS — G4733 Obstructive sleep apnea (adult) (pediatric): Secondary | ICD-10-CM | POA: Diagnosis not present

## 2014-09-22 DIAGNOSIS — I129 Hypertensive chronic kidney disease with stage 1 through stage 4 chronic kidney disease, or unspecified chronic kidney disease: Secondary | ICD-10-CM | POA: Diagnosis not present

## 2014-09-22 DIAGNOSIS — F319 Bipolar disorder, unspecified: Secondary | ICD-10-CM | POA: Diagnosis not present

## 2014-09-22 DIAGNOSIS — D696 Thrombocytopenia, unspecified: Secondary | ICD-10-CM | POA: Diagnosis not present

## 2014-09-22 DIAGNOSIS — N183 Chronic kidney disease, stage 3 (moderate): Secondary | ICD-10-CM | POA: Diagnosis not present

## 2014-09-25 DIAGNOSIS — D649 Anemia, unspecified: Secondary | ICD-10-CM | POA: Diagnosis not present

## 2014-09-25 DIAGNOSIS — D696 Thrombocytopenia, unspecified: Secondary | ICD-10-CM | POA: Diagnosis not present

## 2014-09-25 DIAGNOSIS — G4733 Obstructive sleep apnea (adult) (pediatric): Secondary | ICD-10-CM | POA: Diagnosis not present

## 2014-09-25 DIAGNOSIS — I129 Hypertensive chronic kidney disease with stage 1 through stage 4 chronic kidney disease, or unspecified chronic kidney disease: Secondary | ICD-10-CM | POA: Diagnosis not present

## 2014-09-25 DIAGNOSIS — F319 Bipolar disorder, unspecified: Secondary | ICD-10-CM | POA: Diagnosis not present

## 2014-09-25 DIAGNOSIS — N183 Chronic kidney disease, stage 3 (moderate): Secondary | ICD-10-CM | POA: Diagnosis not present

## 2014-09-25 MED ORDER — METOPROLOL TARTRATE 25 MG PO TABS: 37.5000 mg | ORAL_TABLET | Freq: Two times a day (BID) | ORAL | Status: DC

## 2014-09-25 NOTE — Telephone Encounter (Signed)
Again received request from insurance about metoprolol succinate exceeds "quantity limit". Unclear why. Will send in metoprolol tartrate 25mg  bid. plz notify pt of change.

## 2014-09-26 NOTE — Telephone Encounter (Signed)
Left detailed message on voicemail of Gina [name], [facility] and patient.

## 2014-09-29 DIAGNOSIS — D649 Anemia, unspecified: Secondary | ICD-10-CM | POA: Diagnosis not present

## 2014-09-29 DIAGNOSIS — N183 Chronic kidney disease, stage 3 (moderate): Secondary | ICD-10-CM | POA: Diagnosis not present

## 2014-09-29 DIAGNOSIS — F319 Bipolar disorder, unspecified: Secondary | ICD-10-CM | POA: Diagnosis not present

## 2014-09-29 DIAGNOSIS — G4733 Obstructive sleep apnea (adult) (pediatric): Secondary | ICD-10-CM | POA: Diagnosis not present

## 2014-09-29 DIAGNOSIS — I129 Hypertensive chronic kidney disease with stage 1 through stage 4 chronic kidney disease, or unspecified chronic kidney disease: Secondary | ICD-10-CM | POA: Diagnosis not present

## 2014-09-29 DIAGNOSIS — D696 Thrombocytopenia, unspecified: Secondary | ICD-10-CM | POA: Diagnosis not present

## 2014-10-02 DIAGNOSIS — G4733 Obstructive sleep apnea (adult) (pediatric): Secondary | ICD-10-CM | POA: Diagnosis not present

## 2014-10-02 DIAGNOSIS — D696 Thrombocytopenia, unspecified: Secondary | ICD-10-CM | POA: Diagnosis not present

## 2014-10-02 DIAGNOSIS — N183 Chronic kidney disease, stage 3 (moderate): Secondary | ICD-10-CM | POA: Diagnosis not present

## 2014-10-02 DIAGNOSIS — D649 Anemia, unspecified: Secondary | ICD-10-CM | POA: Diagnosis not present

## 2014-10-02 DIAGNOSIS — F319 Bipolar disorder, unspecified: Secondary | ICD-10-CM | POA: Diagnosis not present

## 2014-10-02 DIAGNOSIS — I129 Hypertensive chronic kidney disease with stage 1 through stage 4 chronic kidney disease, or unspecified chronic kidney disease: Secondary | ICD-10-CM | POA: Diagnosis not present

## 2014-10-03 DIAGNOSIS — R413 Other amnesia: Secondary | ICD-10-CM | POA: Diagnosis not present

## 2014-10-04 DIAGNOSIS — N183 Chronic kidney disease, stage 3 (moderate): Secondary | ICD-10-CM | POA: Diagnosis not present

## 2014-10-04 DIAGNOSIS — G4733 Obstructive sleep apnea (adult) (pediatric): Secondary | ICD-10-CM | POA: Diagnosis not present

## 2014-10-04 DIAGNOSIS — D649 Anemia, unspecified: Secondary | ICD-10-CM | POA: Diagnosis not present

## 2014-10-04 DIAGNOSIS — I129 Hypertensive chronic kidney disease with stage 1 through stage 4 chronic kidney disease, or unspecified chronic kidney disease: Secondary | ICD-10-CM | POA: Diagnosis not present

## 2014-10-04 DIAGNOSIS — D696 Thrombocytopenia, unspecified: Secondary | ICD-10-CM | POA: Diagnosis not present

## 2014-10-04 DIAGNOSIS — F319 Bipolar disorder, unspecified: Secondary | ICD-10-CM | POA: Diagnosis not present

## 2014-10-07 ENCOUNTER — Other Ambulatory Visit: Payer: Self-pay | Admitting: Family Medicine

## 2014-10-07 DIAGNOSIS — D509 Iron deficiency anemia, unspecified: Secondary | ICD-10-CM

## 2014-10-07 DIAGNOSIS — N183 Chronic kidney disease, stage 3 unspecified: Secondary | ICD-10-CM

## 2014-10-07 DIAGNOSIS — R768 Other specified abnormal immunological findings in serum: Secondary | ICD-10-CM

## 2014-10-07 DIAGNOSIS — I10 Essential (primary) hypertension: Secondary | ICD-10-CM

## 2014-10-07 DIAGNOSIS — Z5181 Encounter for therapeutic drug level monitoring: Secondary | ICD-10-CM

## 2014-10-07 DIAGNOSIS — R7303 Prediabetes: Secondary | ICD-10-CM

## 2014-10-09 ENCOUNTER — Other Ambulatory Visit (INDEPENDENT_AMBULATORY_CARE_PROVIDER_SITE_OTHER): Payer: Medicare Other

## 2014-10-09 DIAGNOSIS — Z5181 Encounter for therapeutic drug level monitoring: Secondary | ICD-10-CM | POA: Diagnosis not present

## 2014-10-09 DIAGNOSIS — N183 Chronic kidney disease, stage 3 unspecified: Secondary | ICD-10-CM

## 2014-10-09 LAB — MICROALBUMIN / CREATININE URINE RATIO
CREATININE, U: 67.7 mg/dL
MICROALB/CREAT RATIO: 1 mg/g (ref 0.0–30.0)

## 2014-10-09 LAB — CBC WITH DIFFERENTIAL/PLATELET
BASOS ABS: 0 10*3/uL (ref 0.0–0.1)
Basophils Relative: 0.4 % (ref 0.0–3.0)
Eosinophils Absolute: 0.1 10*3/uL (ref 0.0–0.7)
Eosinophils Relative: 1.6 % (ref 0.0–5.0)
HCT: 35.2 % — ABNORMAL LOW (ref 36.0–46.0)
Hemoglobin: 12.1 g/dL (ref 12.0–15.0)
LYMPHS ABS: 1.9 10*3/uL (ref 0.7–4.0)
Lymphocytes Relative: 32.6 % (ref 12.0–46.0)
MCHC: 34.3 g/dL (ref 30.0–36.0)
MCV: 90.1 fl (ref 78.0–100.0)
MONOS PCT: 13 % — AB (ref 3.0–12.0)
Monocytes Absolute: 0.8 10*3/uL (ref 0.1–1.0)
NEUTROS ABS: 3.1 10*3/uL (ref 1.4–7.7)
NEUTROS PCT: 52.4 % (ref 43.0–77.0)
PLATELETS: 137 10*3/uL — AB (ref 150.0–400.0)
RBC: 3.91 Mil/uL (ref 3.87–5.11)
RDW: 13.7 % (ref 11.5–15.5)
WBC: 6 10*3/uL (ref 4.0–10.5)

## 2014-10-09 LAB — RENAL FUNCTION PANEL
Albumin: 3.9 g/dL (ref 3.5–5.2)
BUN: 37 mg/dL — AB (ref 6–23)
CO2: 29 mEq/L (ref 19–32)
CREATININE: 1.62 mg/dL — AB (ref 0.40–1.20)
Calcium: 9.1 mg/dL (ref 8.4–10.5)
Chloride: 102 mEq/L (ref 96–112)
GFR: 33.97 mL/min — AB (ref >60.00)
Glucose, Bld: 120 mg/dL — ABNORMAL HIGH (ref 70–99)
Phosphorus: 3.9 mg/dL (ref 2.3–4.6)
Potassium: 5.9 mEq/L — ABNORMAL HIGH (ref 3.5–5.1)
Sodium: 135 mEq/L (ref 135–145)

## 2014-10-11 ENCOUNTER — Ambulatory Visit (INDEPENDENT_AMBULATORY_CARE_PROVIDER_SITE_OTHER): Payer: Medicare Other | Admitting: Family Medicine

## 2014-10-11 ENCOUNTER — Other Ambulatory Visit (HOSPITAL_COMMUNITY)
Admission: RE | Admit: 2014-10-11 | Discharge: 2014-10-11 | Disposition: A | Discharge status: Home / Self Care | Payer: Medicare Other | Source: Ambulatory Visit | Attending: Family Medicine | Admitting: Family Medicine

## 2014-10-11 ENCOUNTER — Encounter: Payer: Self-pay | Admitting: Family Medicine

## 2014-10-11 VITALS — BP 118/62 | HR 64 | Temp 98.1°F | Ht 61.5 in | Wt 195.5 lb

## 2014-10-11 DIAGNOSIS — Z124 Encounter for screening for malignant neoplasm of cervix: Secondary | ICD-10-CM | POA: Diagnosis not present

## 2014-10-11 DIAGNOSIS — E785 Hyperlipidemia, unspecified: Secondary | ICD-10-CM

## 2014-10-11 DIAGNOSIS — I10 Essential (primary) hypertension: Secondary | ICD-10-CM

## 2014-10-11 DIAGNOSIS — Z1151 Encounter for screening for human papillomavirus (HPV): Secondary | ICD-10-CM | POA: Diagnosis not present

## 2014-10-11 DIAGNOSIS — Z1272 Encounter for screening for malignant neoplasm of vagina: Secondary | ICD-10-CM

## 2014-10-11 DIAGNOSIS — G4733 Obstructive sleep apnea (adult) (pediatric): Secondary | ICD-10-CM

## 2014-10-11 DIAGNOSIS — N183 Chronic kidney disease, stage 3 unspecified: Secondary | ICD-10-CM

## 2014-10-11 DIAGNOSIS — Z7189 Other specified counseling: Secondary | ICD-10-CM

## 2014-10-11 DIAGNOSIS — E875 Hyperkalemia: Secondary | ICD-10-CM | POA: Diagnosis not present

## 2014-10-11 DIAGNOSIS — C189 Malignant neoplasm of colon, unspecified: Secondary | ICD-10-CM

## 2014-10-11 DIAGNOSIS — Z0000 Encounter for general adult medical examination without abnormal findings: Secondary | ICD-10-CM | POA: Diagnosis not present

## 2014-10-11 DIAGNOSIS — F319 Bipolar disorder, unspecified: Secondary | ICD-10-CM | POA: Diagnosis not present

## 2014-10-11 DIAGNOSIS — Z86010 Personal history of colonic polyps: Secondary | ICD-10-CM

## 2014-10-11 DIAGNOSIS — E669 Obesity, unspecified: Secondary | ICD-10-CM

## 2014-10-11 DIAGNOSIS — R7309 Other abnormal glucose: Secondary | ICD-10-CM

## 2014-10-11 DIAGNOSIS — R7303 Prediabetes: Secondary | ICD-10-CM

## 2014-10-11 DIAGNOSIS — Z9989 Dependence on other enabling machines and devices: Secondary | ICD-10-CM

## 2014-10-11 LAB — BASIC METABOLIC PANEL
BUN: 42 mg/dL — ABNORMAL HIGH (ref 6–23)
CO2: 28 mEq/L (ref 19–32)
Calcium: 9.5 mg/dL (ref 8.4–10.5)
Chloride: 105 mEq/L (ref 96–112)
Creatinine, Ser: 1.53 mg/dL — ABNORMAL HIGH (ref 0.40–1.20)
GFR: 36.28 mL/min — AB (ref >60.00)
GLUCOSE: 161 mg/dL — AB (ref 70–99)
POTASSIUM: 5.1 meq/L (ref 3.5–5.1)
Sodium: 137 mEq/L (ref 135–145)

## 2014-10-11 LAB — HM PAP SMEAR: HM Pap smear: NORMAL

## 2014-10-11 LAB — VALPROIC ACID LEVEL: Valproic Acid Lvl: 74.8 ug/mL (ref 50.0–100.0)

## 2014-10-11 NOTE — Assessment & Plan Note (Signed)
Advanced directives: has not set up. Would want HC proxy to be nephew [name]. May find notary to help her make will.

## 2014-10-11 NOTE — Assessment & Plan Note (Addendum)
Acute worsening noted. Recheck today along with potassium - if persistently elevated, start kayexalate.

## 2014-10-11 NOTE — Assessment & Plan Note (Signed)
Body mass index is 36.35 kg/(m^2).  Discussed increased walking in routine.

## 2014-10-11 NOTE — Patient Instructions (Addendum)
We will refer you for colon cancer screening in [place] - we will call to set this up. If you don't get a letter this summer to repeat mammogram let us know. You'll be due for repeat mammogram 01/2015. Set up living will - packet provided today. Recheck potassium today. Return as needed or in 6 months for follow up.

## 2014-10-11 NOTE — Assessment & Plan Note (Signed)
Now off risperdal per neuro - possibly contributing to worsening memory but on limited testing today memory seems intact. Has f/u with neuro and psych.

## 2014-10-11 NOTE — Assessment & Plan Note (Signed)
I sent order last month for respiratory therapy to come out and evaluate pt's home CPAP machine but pt states this has not been done yet. Advised to check with home health agency re this.

## 2014-10-11 NOTE — Assessment & Plan Note (Signed)
Continue lovastatin. Recheck FLP next visit.

## 2014-10-11 NOTE — Addendum Note (Signed)
Addended by: Ria Bush on: 10/11/2014 10:28 AM   Modules accepted: Orders, SmartSet

## 2014-10-11 NOTE — Assessment & Plan Note (Addendum)
Due for rpt colonoscopy. Requests referral to provider in [place]. Referral placed. Previously had discussed with GI eval for Lynch Syndrome but I don't know if she kept appointment with geneticist.

## 2014-10-11 NOTE — Addendum Note (Signed)
Addended by: Royann Shivers A on: 10/11/2014 10:53 AM   Modules accepted: Orders

## 2014-10-11 NOTE — Assessment & Plan Note (Signed)
2008- colon cancer resected. 11/2012 colonoscopy with 2 diminutive adenomas. Will refer back to GI.

## 2014-10-11 NOTE — Progress Notes (Signed)
Pre visit review using our clinic review tool, if applicable. No additional management support is needed unless otherwise documented below in the visit note. 

## 2014-10-11 NOTE — Assessment & Plan Note (Signed)

## 2014-10-11 NOTE — Progress Notes (Signed)
BP 118/62 mmHg  Pulse 64  Temp(Src) 98.1 F (36.7 C) (Oral)  Ht 5' 1.5" (1.562 m)  Wt 195 lb 8 oz (88.678 kg)  BMI 36.35 kg/m2   CC: medicare wellness visit  Subjective:    Patient ID: Gina [name], female    DOB: [birth year], 64 y.o.   MRN: [id number]  HPI: Gina [name] is a 64 y.o. female presenting on 10/11/2014 for Annual Exam   Recent evaluation by Gina [name] for memory loss - recommended stopping risperdal and memory improved. Has f/u appt with Gina [name] 10/17/2014.   Bipolar - prior on risperdal 3mg  bid, prozac 20mg  daily and depakote 1000mg  nightly. Now off risperdal. Followed by psychiatrist Gina [name]. Has next appt next month. H/o manic episode a few months back that led to hospitalization and that is when risperdal was started.   HLD - on lovastatin 40mg  daily since 08/2014. No myalgias.  Vision screen - done at eye center 08/2014 Failed R hearing screen, pt denies trouble. Falls risk assessment: 1 fall in last year - fell out of tub. Hurt R foot and ankle, but told had gout flare. This was at night in dark. Now better.  Depression screen - negative.   Preventative: H/o colon cancer s/p colectomy 2008. Colonoscopy Gina [name]) 2014 - rec rpt 2 yrs 2/2 adenomatous polyps She is s/p hysterectomy and LSO 1981, pt states there may have been some concern for uterine cancer - all normal paps in past. Last was 2014, normal. Due for rpt this year. Mammogram with biopsy - fibroadenoma 01/2014. rec rpt 1 yr. Breast exam today.  Flu shot - 04/2013 at psychiatris'ts Tetanus shot allergy in chart, pt states may have caused small rash - consider retrial in future. Shingles - declines. Advanced directives: has not set up. Would want HC proxy to be nephew Gina [name]. May find notary to help her make will.  Lives with sister, 1 Gina [name]  Occupation: disabled, for bipolar and arthritis  Edu: GED  Activity: takes walks  Diet: good water, vegetables daily, some fruits    Relevant past medical, surgical, family and social history reviewed and updated as indicated. Interim medical history since our last visit reviewed. Allergies and medications reviewed and updated. Current Outpatient Prescriptions on File Prior to Visit  Medication Sig  . cholecalciferol (VITAMIN D) 400 UNITS TABS Take 400 Units by mouth daily.  . diclofenac sodium (VOLTAREN) 1 % GEL Apply 1 application topically 3 (three) times daily.  . divalproex (DEPAKOTE ER) 500 MG 24 hr tablet Take 2 tablets (1,000 mg total) by mouth at bedtime.  Marland Kitchen FLUoxetine (PROZAC) 20 MG capsule Take 20 mg by mouth daily.  . hydrOXYzine (ATARAX/VISTARIL) 50 MG tablet Take 50 mg by mouth at bedtime as needed (insomnia).  . lovastatin (MEVACOR) 40 MG tablet Take 1 tablet (40 mg total) by mouth at bedtime.  . metoprolol tartrate (LOPRESSOR) 25 MG tablet Take 1.5 tablets (37.5 mg total) by mouth 2 (two) times daily.  Marland Kitchen senna-docusate (SENOKOT-S) 8.6-50 MG per tablet Take 1 tablet by mouth daily as needed for mild constipation.   No current facility-administered medications on file prior to visit.   Past Medical History  Diagnosis Date  . Bipolar depression     sees psychiatrist  . DJD (degenerative joint disease), lumbar     chronic lower back pain  . Obesity (BMI 30-39.9)   . History of colon cancer     s/p surgery  . Enterocutaneous fistula 04/07/2012  completed PT 06/2012 (Gina [name])  . OSA on CPAP     6cm H2O  . RBBB   . Anemia in chronic kidney disease   . Blood transfusion without reported diagnosis 2009  . Cataract     left  . GERD (gastroesophageal reflux disease)   . Hyperlipidemia   . Hypertension   . History of bladder cancer 1997  . History of uterine cancer     s/p hysterectomy  . CKD (chronic kidney disease) stage 3, GFR 30-59 ml/min   . Personal history of colonic adenomas and colon cancer 11/11/2012  . IDA (iron deficiency anemia) 01/2013    thought due to h/o polyps  . Positive  hepatitis C antibody test 09/2014    but negative confirmatory testing    Past Surgical History  Procedure Laterality Date  . Hernia repair  02/04/12  . Partial colectomy  about 2008    for colon cancer  . I & d abdominal wound  02/19/12  . Removal of infected mesh and abdominal wound vac placement  02/24/12  . Reexploration of abdominal wound and allograft placemet  02/26/12  . Partial hysterectomy  1981    uterine cancer, R ovary remains  . Left oophorectomy  2005  . Colonoscopy  07/2009  . Dexa  12/2009    WNL  . Dobutamine stress echo  12/2009    no evidence of ischemia  . Colonoscopy  11/2012    2 tubular adenomas, mild diverticulosis, pending genetic testing for Lynch syndrome Gina [name]) rpt 2 yrs  . Breast biopsy Right 01/2014    fibroadenoma  . Sleep study  02/2014    OSA - AHI 55, nadir 81% Gina [name])   Review of Systems Per HPI unless specifically indicated above     Objective:    BP 118/62 mmHg  Pulse 64  Temp(Src) 98.1 F (36.7 C) (Oral)  Ht 5' 1.5" (1.562 m)  Wt 195 lb 8 oz (88.678 kg)  BMI 36.35 kg/m2  Wt Readings from Last 3 Encounters:  10/11/14 195 lb 8 oz (88.678 kg)  08/16/14 190 lb 8 oz (86.41 kg)  06/12/14 191 lb 8 oz (86.864 kg)    Physical Exam  Constitutional: She is oriented to person, place, and time. She appears well-developed and well-nourished. No distress.  HENT:  Head: Normocephalic and atraumatic.  Right Ear: Hearing, tympanic membrane, external ear and ear canal normal.  Left Ear: Hearing, tympanic membrane, external ear and ear canal normal.  Nose: Nose normal.  Mouth/Throat: Uvula is midline, oropharynx is clear and moist and mucous membranes are normal. No oropharyngeal exudate, posterior oropharyngeal edema or posterior oropharyngeal erythema.  Eyes: Conjunctivae and EOM are normal. Pupils are equal, round, and reactive to light. No scleral icterus.  Neck: Normal range of motion. Neck supple. Carotid bruit is not present. No thyromegaly  present.  Cardiovascular: Normal rate, regular rhythm, normal heart sounds and intact distal pulses.   No murmur heard. Pulses:      Radial pulses are 2+ on the right side, and 2+ on the left side.  Pulmonary/Chest: Effort normal and breath sounds normal. No respiratory distress. She has no wheezes. She has no rales. Right breast exhibits no inverted nipple, no mass, no nipple discharge, no skin change and no tenderness. Left breast exhibits no inverted nipple, no mass, no nipple discharge, no skin change and no tenderness.  Abdominal: Soft. Bowel sounds are normal. She exhibits no distension and no mass. There is no tenderness. There is no  rebound and no guarding.  Genitourinary: Vagina normal. Pelvic exam was performed with patient supine. There is no rash, tenderness, lesion or injury on the right labia. There is no rash, tenderness, lesion or injury on the left labia. Right adnexum displays no mass, no tenderness and no fullness. Left adnexum displays no mass, no tenderness and no fullness.  Cervix/uterus absent  Musculoskeletal: Normal range of motion. She exhibits no edema.  Lymphadenopathy:       Head (right side): No submental, no submandibular, no tonsillar, no preauricular and no posterior auricular adenopathy present.       Head (left side): No submental, no submandibular, no tonsillar, no preauricular and no posterior auricular adenopathy present.    She has no cervical adenopathy.    She has no axillary adenopathy.       Right axillary: No lateral adenopathy present.       Left axillary: No lateral adenopathy present.      Right: No supraclavicular adenopathy present.       Left: No supraclavicular adenopathy present.  Neurological: She is alert and oriented to person, place, and time.  CN grossly intact, station and gait intact Recall 3/3  Calculation 5/5 serial 3s  Skin: Skin is warm and dry. No rash noted.  Psychiatric: She has a normal mood and affect. Her behavior is normal.  Judgment and thought content normal.  Nursing note and vitals reviewed.  Results for orders placed or performed in visit on 10/09/14  Renal function panel  Result Value Ref Range   Sodium 135 135 - 145 mEq/L   Potassium 5.9 (H) 3.5 - 5.1 mEq/L   Chloride 102 96 - 112 mEq/L   CO2 29 19 - 32 mEq/L   Calcium 9.1 8.4 - 10.5 mg/dL   Albumin 3.9 3.5 - 5.2 g/dL   BUN 37 (H) 6 - 23 mg/dL   Creatinine, Ser 1.62 (H) 0.40 - 1.20 mg/dL   Glucose, Bld 120 (H) 70 - 99 mg/dL   Phosphorus 3.9 2.3 - 4.6 mg/dL   GFR 33.97 (L) >60.00 mL/min  CBC with Differential/Platelet  Result Value Ref Range   WBC 6.0 4.0 - 10.5 K/uL   RBC 3.91 3.87 - 5.11 Mil/uL   Hemoglobin 12.1 12.0 - 15.0 g/dL   HCT 35.2 (L) 36.0 - 46.0 %   MCV 90.1 78.0 - 100.0 fl   MCHC 34.3 30.0 - 36.0 g/dL   RDW 13.7 11.5 - 15.5 %   Platelets 137.0 (L) 150.0 - 400.0 K/uL   Neutrophils Relative % 52.4 43.0 - 77.0 %   Lymphocytes Relative 32.6 12.0 - 46.0 %   Monocytes Relative 13.0 (H) 3.0 - 12.0 %   Eosinophils Relative 1.6 0.0 - 5.0 %   Basophils Relative 0.4 0.0 - 3.0 %   Neutro Abs 3.1 1.4 - 7.7 K/uL   Lymphs Abs 1.9 0.7 - 4.0 K/uL   Monocytes Absolute 0.8 0.1 - 1.0 K/uL   Eosinophils Absolute 0.1 0.0 - 0.7 K/uL   Basophils Absolute 0.0 0.0 - 0.1 K/uL  Valproic acid level  Result Value Ref Range   Valproic Acid Lvl 74.8 50.0 - 100.0 ug/mL  Microalbumin / creatinine urine ratio  Result Value Ref Range   Microalb, Ur <0.7 0.0 - 1.9 mg/dL   Creatinine,U 67.7 mg/dL   Microalb Creat Ratio 1.0 0.0 - 30.0 mg/g      Assessment & Plan:   Problem List Items Addressed This Visit    Prediabetes  Reviewed need for monitoring added sugar intake. Check A1c next visit.      OSA on CPAP    I sent order last month for respiratory therapy to come out and evaluate pt's home CPAP machine but pt states this has not been done yet. Advised to check with home health agency re this.      Obesity (BMI 30-39.9)    Body mass index is  36.35 kg/(m^2).  Discussed increased walking in routine.      Medicare annual wellness visit, subsequent - Primary    I have personally reviewed the Medicare Annual Wellness questionnaire and have noted 1. The patient's medical and social history 2. Their use of alcohol, tobacco or illicit drugs 3. Their current medications and supplements 4. The patient's functional ability including ADL's, fall risks, home safety risks and hearing or visual impairment. 5. Diet and physical activity 6. Evidence for depression or mood disorders The patients weight, height, BMI have been recorded in the chart.  Hearing and vision has been addressed. I have made referrals, counseling and provided education to the patient based review of the above and I have provided the pt with a written personalized care plan for preventive services. Provider list updated - see scanned questionairre. Reviewed preventative protocols and updated unless pt declined.       Hyperlipidemia    Continue lovastatin. Recheck FLP next visit.      HTN (hypertension)    Chronic, stable. Continue regimen.      History of colonic polyps    2008- colon cancer resected. 11/2012 colonoscopy with 2 diminutive adenomas. Will refer back to GI.      Colon cancer    Due for rpt colonoscopy. Requests referral to provider in [place]. Referral placed. Previously had discussed with GI eval for Lynch Syndrome but I don't know if she kept appointment with geneticist.      CKD (chronic kidney disease) stage 3, GFR 30-59 ml/min    Acute worsening noted. Recheck today along with potassium - if persistently elevated, start kayexalate.      Bipolar 1 disorder    Now off risperdal per neuro - possibly contributing to worsening memory but on limited testing today memory seems intact. Has f/u with neuro and psych.      Advanced care planning/counseling discussion    Advanced directives: has not set up. Would want HC proxy to be nephew Gina [name]. May find notary to help her make will.       Other Visit Diagnoses    Hyperkalemia        Relevant Orders    Basic metabolic panel        Follow up plan: Return in about 6 months (around 04/13/2015), or as needed, for follow up visit.

## 2014-10-11 NOTE — Assessment & Plan Note (Signed)
Reviewed need for monitoring added sugar intake. Check A1c next visit.

## 2014-10-11 NOTE — Assessment & Plan Note (Signed)
Chronic, stable. Continue regimen. 

## 2014-10-12 ENCOUNTER — Encounter: Payer: Self-pay | Admitting: *Deleted

## 2014-10-12 DIAGNOSIS — I129 Hypertensive chronic kidney disease with stage 1 through stage 4 chronic kidney disease, or unspecified chronic kidney disease: Secondary | ICD-10-CM | POA: Diagnosis not present

## 2014-10-12 DIAGNOSIS — F319 Bipolar disorder, unspecified: Secondary | ICD-10-CM | POA: Diagnosis not present

## 2014-10-12 DIAGNOSIS — D649 Anemia, unspecified: Secondary | ICD-10-CM | POA: Diagnosis not present

## 2014-10-12 DIAGNOSIS — N183 Chronic kidney disease, stage 3 (moderate): Secondary | ICD-10-CM | POA: Diagnosis not present

## 2014-10-12 DIAGNOSIS — D696 Thrombocytopenia, unspecified: Secondary | ICD-10-CM | POA: Diagnosis not present

## 2014-10-12 DIAGNOSIS — G4733 Obstructive sleep apnea (adult) (pediatric): Secondary | ICD-10-CM | POA: Diagnosis not present

## 2014-10-12 LAB — CYTOLOGY - PAP

## 2014-10-12 NOTE — Telephone Encounter (Signed)
Gina [name] nurse with [facility] left v/m regarding cpap orders. Gina [name] ask that cpap orders be faxed to [facility] at [phone] and order needs to state needs order for mask and order for cpap water chamber to be repaired.Also fax copy of sleep study.

## 2014-10-13 ENCOUNTER — Telehealth: Payer: Self-pay

## 2014-10-13 NOTE — Telephone Encounter (Signed)
Cindy Education officer, museum with [facility] left v/m requesting verbal order for home health social work ; pt has requested assistance with preparing health care POA and living well.

## 2014-10-13 NOTE — Telephone Encounter (Signed)
Agree with this. We talked about this at office visit.

## 2014-10-13 NOTE — Telephone Encounter (Signed)
Message left notifying Gina [name].

## 2014-10-16 ENCOUNTER — Encounter: Payer: Self-pay | Admitting: *Deleted

## 2014-10-17 ENCOUNTER — Institutional Professional Consult (permissible substitution): Payer: Medicare Other | Admitting: Pulmonary Disease

## 2014-10-17 DIAGNOSIS — R4182 Altered mental status, unspecified: Secondary | ICD-10-CM | POA: Diagnosis not present

## 2014-10-17 DIAGNOSIS — R413 Other amnesia: Secondary | ICD-10-CM | POA: Diagnosis not present

## 2014-10-18 NOTE — Telephone Encounter (Addendum)
Melissa with [facility] left v/m that pt did not get [facility]ap from [facility] and need to send order to DME co that pt got cpap from. Pt does have [facility] for home health but [facility] cannot repair cpap or give cpap supplies. Melissa understands request came from [facility] but still cannot assist pt with cpap.

## 2014-10-18 NOTE — Telephone Encounter (Signed)
Order faxed along with sleep study

## 2014-10-19 DIAGNOSIS — N183 Chronic kidney disease, stage 3 (moderate): Secondary | ICD-10-CM | POA: Diagnosis not present

## 2014-10-19 DIAGNOSIS — D696 Thrombocytopenia, unspecified: Secondary | ICD-10-CM | POA: Diagnosis not present

## 2014-10-19 DIAGNOSIS — G4733 Obstructive sleep apnea (adult) (pediatric): Secondary | ICD-10-CM | POA: Diagnosis not present

## 2014-10-19 DIAGNOSIS — D649 Anemia, unspecified: Secondary | ICD-10-CM | POA: Diagnosis not present

## 2014-10-19 DIAGNOSIS — F319 Bipolar disorder, unspecified: Secondary | ICD-10-CM | POA: Diagnosis not present

## 2014-10-19 DIAGNOSIS — I129 Hypertensive chronic kidney disease with stage 1 through stage 4 chronic kidney disease, or unspecified chronic kidney disease: Secondary | ICD-10-CM | POA: Diagnosis not present

## 2014-10-19 NOTE — Telephone Encounter (Addendum)
plz fax order to [facility] store that pt received CPAP from. If pt doesn't know where, would refer her to [facility] to establish for OSA.

## 2014-10-19 NOTE — Telephone Encounter (Signed)
Order faxed to [facility], which is who supplied machine and supplies in 2014.

## 2014-10-23 ENCOUNTER — Other Ambulatory Visit: Payer: Self-pay | Admitting: *Deleted

## 2014-10-23 ENCOUNTER — Other Ambulatory Visit: Payer: Self-pay | Admitting: Family Medicine

## 2014-10-23 MED ORDER — HYDROXYZINE HCL 50 MG PO TABS: 50.0000 mg | ORAL_TABLET | Freq: Every evening | ORAL | Status: DC | PRN

## 2014-10-23 NOTE — Telephone Encounter (Signed)
Ok to refill 

## 2014-10-27 DIAGNOSIS — I129 Hypertensive chronic kidney disease with stage 1 through stage 4 chronic kidney disease, or unspecified chronic kidney disease: Secondary | ICD-10-CM | POA: Diagnosis not present

## 2014-10-27 DIAGNOSIS — N183 Chronic kidney disease, stage 3 (moderate): Secondary | ICD-10-CM | POA: Diagnosis not present

## 2014-10-27 DIAGNOSIS — D696 Thrombocytopenia, unspecified: Secondary | ICD-10-CM | POA: Diagnosis not present

## 2014-10-27 DIAGNOSIS — G4733 Obstructive sleep apnea (adult) (pediatric): Secondary | ICD-10-CM | POA: Diagnosis not present

## 2014-10-27 DIAGNOSIS — F319 Bipolar disorder, unspecified: Secondary | ICD-10-CM | POA: Diagnosis not present

## 2014-10-27 DIAGNOSIS — D649 Anemia, unspecified: Secondary | ICD-10-CM | POA: Diagnosis not present

## 2014-11-03 ENCOUNTER — Ambulatory Visit (INDEPENDENT_AMBULATORY_CARE_PROVIDER_SITE_OTHER): Payer: Medicare Other | Admitting: Family Medicine

## 2014-11-03 ENCOUNTER — Encounter: Payer: Self-pay | Admitting: Family Medicine

## 2014-11-03 VITALS — BP 116/62 | HR 56 | Temp 98.7°F | Resp 16 | Ht 62.0 in | Wt 194.8 lb

## 2014-11-03 DIAGNOSIS — L259 Unspecified contact dermatitis, unspecified cause: Secondary | ICD-10-CM

## 2014-11-03 DIAGNOSIS — J3089 Other allergic rhinitis: Secondary | ICD-10-CM | POA: Diagnosis not present

## 2014-11-03 MED ORDER — TRIAMCINOLONE ACETONIDE 0.5 % EX CREA
1.0000 | TOPICAL_CREAM | Freq: Two times a day (BID) | CUTANEOUS | Status: DC
Start: 2014-11-03 — End: 2015-01-09

## 2014-11-03 NOTE — Assessment & Plan Note (Signed)
Treat with topical steroid cream BID. Likely due to or at least worsened by hand sanitizer, so stop.

## 2014-11-03 NOTE — Assessment & Plan Note (Signed)
No clear sign of infeciton.  Likely dur to allergies. Trial of zyrtec  at bedtime.

## 2014-11-03 NOTE — Progress Notes (Signed)
   Subjective:    Patient ID: Gina [name], female    DOB: Dec 09, 1950, 64 y.o.   MRN: [id number]  HPI  64 year old female pt of Dr. Darnell Level presents with 2 new issues.   1. Redness and dry skin noted x 2 weeks. Has a dog, no recent flea application. She uses hand sanitizer...using after using bathroom, has been using for a  While.  No other new exposure.  Slightly burning and itching on backs of hands.  She tried treating with vaseline, hand lotion and oral benadrl.    2. Runny nose,ongoing for 3-4 weeks. Occ sneeze, no ithcy eyes, slightly burning eyes, no watering.  No fever, no cough.  No SOB. Nose was sore.Marland Kitchen appling veseline helped with soreness from runny nose. Tried benadryl.. Did not help with nasal symptoms as well. She has not used any nasal sprays in past.     Review of Systems  Constitutional: Negative for fever and fatigue.  HENT: Negative for ear pain.   Eyes: Negative for pain.  Respiratory: Negative for chest tightness and shortness of breath.   Cardiovascular: Negative for chest pain, palpitations and leg swelling.  Gastrointestinal: Negative for abdominal pain.  Genitourinary: Negative for dysuria.       Objective:   Physical Exam  Constitutional: Vital signs are normal. She appears well-developed and well-nourished. She is cooperative.  Non-toxic appearance. She does not appear ill. No distress.  HENT:  Head: Normocephalic.  Right Ear: Hearing, tympanic membrane, external ear and ear canal normal. Tympanic membrane is not erythematous, not retracted and not bulging.  Left Ear: Hearing, tympanic membrane, external ear and ear canal normal. Tympanic membrane is not erythematous, not retracted and not bulging.  Nose: Mucosal edema and rhinorrhea present. Right sinus exhibits no maxillary sinus tenderness and no frontal sinus tenderness. Left sinus exhibits no maxillary sinus tenderness and no frontal sinus tenderness.  Mouth/Throat: Uvula is midline,  oropharynx is clear and moist and mucous membranes are normal.  Eyes: Conjunctivae, EOM and lids are normal. Pupils are equal, round, and reactive to light. Lids are everted and swept, no foreign bodies found.  Neck: Trachea normal and normal range of motion. Neck supple. Carotid bruit is not present. No thyroid mass and no thyromegaly present.  Cardiovascular: Normal rate, regular rhythm, S1 normal, S2 normal, normal heart sounds, intact distal pulses and normal pulses.  Exam reveals no gallop and no friction rub.   No murmur heard. Pulmonary/Chest: Effort normal and breath sounds normal. No tachypnea. No respiratory distress. She has no decreased breath sounds. She has no wheezes. She has no rhonchi. She has no rales.  Abdominal: Soft. Normal appearance and bowel sounds are normal. There is no tenderness.  Neurological: She is alert.  Skin: Skin is warm, dry and intact. Rash noted.  Erythematous, dry, cracked thickend skin on backs of bilateral hands.  Psychiatric: Her speech is normal and behavior is normal. Judgment and thought content normal. Her mood appears not anxious. Cognition and memory are normal. She does not exhibit a depressed mood.          Assessment & Plan:

## 2014-11-03 NOTE — Patient Instructions (Signed)
Stop using hand sanitizer given it is drying out hands more.Use hypoallergenic gentle hand wash. Apply steroid cream twice daily x 2 weeks, call if rash is not improving.  Start zyrtec 10 mg OTC at bedtime for allergic rhinitis. Call if not improving in 1-2 weeks.

## 2014-11-03 NOTE — Progress Notes (Signed)
Pre visit review using our clinic review tool, if applicable. No additional management support is needed unless otherwise documented below in the visit note. 

## 2014-11-09 ENCOUNTER — Encounter: Payer: Self-pay | Admitting: Internal Medicine

## 2014-11-15 ENCOUNTER — Other Ambulatory Visit
Admit: 2014-11-15 | Disposition: A | Discharge status: Home / Self Care | Payer: Self-pay | Attending: Psychiatry | Admitting: Psychiatry

## 2014-11-15 DIAGNOSIS — Z5181 Encounter for therapeutic drug level monitoring: Secondary | ICD-10-CM | POA: Diagnosis not present

## 2014-11-15 DIAGNOSIS — Z79899 Other long term (current) drug therapy: Secondary | ICD-10-CM | POA: Diagnosis not present

## 2014-11-15 DIAGNOSIS — F319 Bipolar disorder, unspecified: Secondary | ICD-10-CM | POA: Diagnosis not present

## 2014-11-15 LAB — CBC WITH DIFFERENTIAL/PLATELET
BASOS ABS: 0 10*3/uL (ref 0.0–0.1)
Basophil %: 0.6 %
Eosinophil #: 0.1 10*3/uL (ref 0.0–0.7)
Eosinophil %: 1.4 %
HCT: 38 % (ref 35.0–47.0)
HGB: 12.8 g/dL (ref 12.0–16.0)
LYMPHS PCT: 29.5 %
Lymphocyte #: 1.7 10*3/uL (ref 1.0–3.6)
MCH: 30.5 pg (ref 26.0–34.0)
MCHC: 33.6 g/dL (ref 32.0–36.0)
MCV: 91 fL (ref 80–100)
MONOS PCT: 12.5 %
Monocyte #: 0.7 x10 3/mm (ref 0.2–0.9)
NEUTROS ABS: 3.3 10*3/uL (ref 1.4–6.5)
NEUTROS PCT: 56 %
Platelet: 122 10*3/uL — ABNORMAL LOW (ref 150–440)
RBC: 4.19 10*6/uL (ref 3.80–5.20)
RDW: 13 % (ref 11.5–14.5)
WBC: 5.9 10*3/uL (ref 3.6–11.0)

## 2014-11-15 LAB — HEPATIC FUNCTION PANEL A (ARMC)
Albumin: 3.8 g/dL
Alkaline Phosphatase: 85 U/L
BILIRUBIN TOTAL: 0.7 mg/dL
Bilirubin, Direct: 0.1 mg/dL
Indirect Bilirubin: 0.6
SGOT(AST): 30 U/L
SGPT (ALT): 29 U/L
Total Protein: 7.3 g/dL

## 2014-11-15 LAB — CBC AND DIFFERENTIAL
Neutrophils Absolute: 56 /uL
Platelets: 122 10*3/uL — AB (ref 150–399)
WBC: 5.9 10^3/mL

## 2014-11-15 LAB — HEPATIC FUNCTION PANEL
ALT: 29 U/L (ref 7–35)
AST: 30 U/L (ref 13–35)
Alkaline Phosphatase: 85 U/L (ref 25–125)
Bilirubin, Total: 0.7 mg/dL

## 2014-11-20 ENCOUNTER — Encounter: Payer: Self-pay | Admitting: Internal Medicine

## 2014-11-20 ENCOUNTER — Ambulatory Visit (INDEPENDENT_AMBULATORY_CARE_PROVIDER_SITE_OTHER): Payer: Medicare Other | Admitting: Internal Medicine

## 2014-11-20 VITALS — BP 112/68 | HR 59 | Temp 98.9°F | Wt 190.0 lb

## 2014-11-20 DIAGNOSIS — J309 Allergic rhinitis, unspecified: Secondary | ICD-10-CM

## 2014-11-20 DIAGNOSIS — L259 Unspecified contact dermatitis, unspecified cause: Secondary | ICD-10-CM

## 2014-11-20 MED ORDER — FLUTICASONE PROPIONATE 50 MCG/ACT NA SUSP: 2.0000 | Freq: Every day | NASAL | Status: DC

## 2014-11-20 NOTE — Progress Notes (Signed)
   Subjective:    Patient ID: Gina [name], female    DOB: [birth year], 64 y.o.   MRN: [id number]  HPI Gina [name] is a 64 year old female who presents today with chief complaint of runny nose and rash on her hands.  She has had a runny nose for 2 weeks and was seen in this office and advised to take Zyrtec.  She has taken this without relief.   She also complains of a rash on her hands that started 2 weeks ago.  She was given a steroid cream and has used it daily without relief.    Review of Systems  Constitutional: Negative for fever, chills and fatigue.  HENT: Positive for postnasal drip. Negative for congestion, rhinorrhea and sore throat.   Respiratory: Negative for cough, shortness of breath and wheezing.   Cardiovascular: Negative for chest pain, palpitations and leg swelling.  Genitourinary: Negative.   Musculoskeletal: Negative for myalgias and back pain.  Skin: Negative.        Objective:   Physical Exam  Constitutional: She is oriented to person, place, and time. She appears well-developed and well-nourished.  HENT:  Head: Normocephalic and atraumatic.  Right Ear: External ear normal.  Left Ear: External ear normal.  Mouth/Throat: Oropharynx is clear and moist.  Eyes: Pupils are equal, round, and reactive to light.  Neck: Normal range of motion. Neck supple.  Cardiovascular: Normal rate, regular rhythm and normal heart sounds.   Pulmonary/Chest: Effort normal and breath sounds normal.  Lymphadenopathy:    She has no cervical adenopathy.  Neurological: She is alert and oriented to person, place, and time.  Skin: Skin is warm and dry.          Assessment & Plan:  1. Allergic rhinitis Advised patient to take flonase OTC and continue Zyrtec.  Follow up with office if symptoms persist after 2 weeks. 2. Contact dermatitis No rash visualized.  Patient has no itching or burning.  Ok to use lotion to moisturize area.

## 2014-11-20 NOTE — Progress Notes (Signed)
Subjective:    Patient ID: Gina [name], female    DOB: March 20, 1951, 64 y.o.   MRN: [id number]  HPI  Pt presents to the clinic today to follow up her last visit from 11/03/14. She was c/o a runny nose. It is clear mucous. She was diagnosed with allergies and advised to take Zyrtec daily x 2 weeks. She has been taking it as directed but has not had any relief. She denies fever, chills or other URI. She has not taken anything else OTC. She has not had sick contacts that she is aware of.   She also reports the rash on her hands has improved but not gone away. She was diagnosed with contact dermatitis and has been putting Kenalog cream on it twice daily. She reports the area does not itch or burn but it is still slightly red.   Review of Systems      Past Medical History  Diagnosis Date  . Bipolar depression     sees psychiatrist  . DJD (degenerative joint disease), lumbar     chronic lower back pain  . Obesity (BMI 30-39.9)   . History of colon cancer     s/p surgery  . 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 OSA on CPAP     6cm H2O  . RBBB   . Anemia in chronic kidney disease   . Blood transfusion without reported diagnosis 2009  . Cataract     left  . GERD (gastroesophageal reflux disease)   . Hyperlipidemia   . Hypertension   . History of bladder cancer 1997  . History of uterine cancer     s/p hysterectomy  . CKD (chronic kidney disease) stage 3, GFR 30-59 ml/min   . Personal history of colonic adenomas and colon cancer 11/11/2012  . IDA (iron deficiency anemia) 01/2013    thought due to h/o polyps  . Positive hepatitis C antibody test 09/2014    but negative confirmatory testing    Current Outpatient Prescriptions  Medication Sig Dispense Refill  . cetirizine (ZYRTEC) 10 MG tablet Take 10 mg by mouth daily.    . cholecalciferol (VITAMIN D) 400 UNITS TABS Take 400 Units by mouth daily.    . divalproex (DEPAKOTE ER) 500 MG 24 hr  tablet Take 2 tablets (1,000 mg total) by mouth at bedtime.    Marland Kitchen FLUoxetine (PROZAC) 20 MG capsule Take 20 mg by mouth daily.    . hydrOXYzine (ATARAX/VISTARIL) 50 MG tablet Take 1 tablet (50 mg total) by mouth at bedtime as needed (insomnia). 30 tablet 6  . lovastatin (MEVACOR) 40 MG tablet Take 1 tablet (40 mg total) by mouth at bedtime. 90 tablet 3  . metoprolol tartrate (LOPRESSOR) 25 MG tablet Take 1.5 tablets (37.5 mg total) by mouth 2 (two) times daily. 270 tablet 3  . senna-docusate (SENOKOT-S) 8.6-50 MG per tablet Take 1 tablet by mouth daily as needed for mild constipation.    . triamcinolone cream (KENALOG) 0.5 % Apply 1 application topically 2 (two) times daily. 30 g 0  . VOLTAREN 1 % GEL USE 1 APPLICATION TOPICALLY 3 (THREE) TIMES DAILY 100 g 1   No current facility-administered medications for this visit.    Allergies  Allergen Reactions  . Penicillins     "Knocker out for awhile" per the pt  . Risperdal [Risperidone] Other (See Comments)    Memory loss/AMS evaluated by Dr Melrose Nakayama [facility] at [facility]  . Ivp Dye [  Iodinated Diagnostic Agents] Rash  . Tetanus Toxoids Rash    Small rash, not hives, no swelling.  Wants to try again.  Marland Kitchen Zetia [Ezetimibe] Rash    Family History  Problem Relation Age of Onset  . Cancer Mother     stomach  . Colon cancer Mother   . Cancer Sister     colon  . Colon cancer Sister   . CAD Father     MI  . Diabetes Other     aunts and uncles both sides  . Arthritis Other     strong fmhx  . Mental illness Sister     anxiety/depression  . Cancer Maternal Grandmother     breast  . Esophageal cancer Neg Hx   . Rectal cancer Neg Hx   . Stomach cancer Neg Hx     History   Social History  . Marital Status: Single    Spouse Name: N/A  . Number of Children: N/A  . Years of Education: N/A   Occupational History  . Not on file.   Social History Main Topics  . Smoking status: Never Smoker   . Smokeless tobacco: Never Used  . Alcohol  Use: No  . Drug Use: No  . Sexual Activity: No   Other Topics Concern  . Not on file   Social History Narrative   Lives with sister, no pets   Occupation: disabled, for bipolar and arthritis   Edu: GED   Activity: take walks   Diet: good water, vegetables daily      Psych - [place], Dr. Jimmye Norman (ph [phone])     Constitutional: Denies fever, malaise, fatigue, headache or abrupt weight changes.  HEENT: Pt reports runny nose. Denies eye pain, eye redness, ear pain, ringing in the ears, wax buildup, nasal congestion, bloody nose, or sore throat. Respiratory: Denies difficulty breathing, shortness of breath, cough or sputum production.   Cardiovascular: Denies chest pain, chest tightness, palpitations or swelling in the hands or feet.  Skin: Pt reports redness of hands. Denies rashes, lesions or ulcercations.   No other specific complaints in a complete review of systems (except as listed in HPI above).   Objective:   Physical Exam   BP 112/68 mmHg  Pulse 59  Temp(Src) 98.9 F (37.2 C) (Oral)  Wt 190 lb (86.183 kg)  SpO2 98% Wt Readings from Last 3 Encounters:  11/20/14 190 lb (86.183 kg)  11/03/14 194 lb 12.8 oz (88.361 kg)  10/11/14 195 lb 8 oz (88.678 kg)    General: Appears their stated age, well developed, well nourished in NAD. Skin: Warm, dry and intact. Mild redness noted of bilateral dorsal surface of hands. HEENT: Head: normal shape and size; Nose: mucosa boggy and moist, septum midline; Throat/Mouth: mucosa pink and moist, no exudate, lesions or ulcerations noted.  Neck: No adenopathy noted..  Cardiovascular: Normal rate and rhythm.  Pulmonary/Chest: Normal effort and positive vesicular breath sounds. No respiratory distress. No wheezes, rales or ronchi noted.    BMET    Component Value Date/Time   NA 137 10/11/2014 1031   NA 142 01/06/2013   K 5.1 10/11/2014 1031   K 5.5 01/06/2013   CL 105 10/11/2014 1031   CO2 28 10/11/2014 1031     GLUCOSE 161* 10/11/2014 1031   BUN 42* 10/11/2014 1031   CREATININE 1.53* 10/11/2014 1031   CREATININE 1.99 01/06/2013   CALCIUM 9.5 10/11/2014 1031   CALCIUM 9.5 01/06/2013   GFRNONAA 45*  05/13/2012 0715   GFRAA 52* 05/13/2012 0715    Lipid Panel     Component Value Date/Time   CHOL 247 08/09/2014   CHOL 170 03/10/2014 0803   TRIG 192 08/09/2014   TRIG 151.0* 03/10/2014 0803   HDL 40 08/09/2014   CHOLHDL 5 03/10/2014 0803   VLDL 30.2 03/10/2014 0803   LDLCALC 169 08/09/2014   LDLCALC 105* 03/10/2014 0803    CBC    Component Value Date/Time   WBC 6.0 10/09/2014 0857   RBC 3.91 10/09/2014 0857   RBC 3.85* 04/05/2013 1541   HGB 12.1 10/09/2014 0857   HGB 11.8 08/09/2014   HCT 35.2* 10/09/2014 0857   PLT 137.0* 10/09/2014 0857   MCV 90.1 10/09/2014 0857   MCV 83.0 01/06/2013   MCH 31.2 05/10/2012 0630   MCHC 34.3 10/09/2014 0857   RDW 13.7 10/09/2014 0857   LYMPHSABS 1.9 10/09/2014 0857   MONOABS 0.8 10/09/2014 0857   EOSABS 0.1 10/09/2014 0857   BASOSABS 0.0 10/09/2014 0857    Hgb A1C Lab Results  Component Value Date   HGBA1C 5.6 04/14/2012        Assessment & Plan:   Allergic Rhinitis:  Start using Flonase daily OTC Continue Zyrtec OTC Watch for fever, chills, facial pain or colored nasal mucous  Contact Dermatitis:  It seems to have improved with the kenalog cream Continue to monitor for now  RTC as needed

## 2014-11-20 NOTE — Patient Instructions (Signed)

## 2014-11-20 NOTE — Progress Notes (Signed)
Pre visit review using our clinic review tool, if applicable. No additional management support is needed unless otherwise documented below in the visit note. 

## 2014-11-22 DIAGNOSIS — F319 Bipolar disorder, unspecified: Secondary | ICD-10-CM | POA: Diagnosis not present

## 2014-11-24 ENCOUNTER — Telehealth: Payer: Self-pay | Admitting: Family Medicine

## 2014-11-24 DIAGNOSIS — D696 Thrombocytopenia, unspecified: Secondary | ICD-10-CM

## 2014-11-24 NOTE — Telephone Encounter (Signed)
Dr.Williams called and asked to speak to Dr.Gutierrez.  I let him know Dr.Gutierrez is off until Monday. He asked for Dr.Gutierrez to call him back on Monday.

## 2014-11-25 NOTE — Discharge Summary (Signed)
PATIENT NAME:  Gina [name], WUEST MR#:  [id number] DATE OF BIRTH:  1951-06-05  DATE OF ADMISSION:  05/16/2014 DATE OF DISCHARGE:  05/29/2014    IDENTIFYING INFORMATION:  The patient is a 64 year old single Caucasian female with history of bipolar disorder.   CHIEF COMPLAINT: "I'm feeling hyper."   DISCHARGE DIAGNOSES:  1.  Bipolar disorder type I, current episode manic with mixed features.  2.  Hypertension. 3.  Borderline diabetes. 4. Obstructive sleep apnea.   DISCHARGE MEDICATIONS:  Risperdal 3 mg p.o. b.i.d., Depakote-ER 1500 mg p.o. at bedtime, Vistaril 50 mg p.o. at bedtime as needed for insomnia, metoprolol XL 75 mg p.o. daily for hypertension, metformin 500 mg p.o. b.i.d. for borderline diabetes, fluoxetine 20 mg p.o. daily for depression, Senna 1 tablet p.o. daily for constipation and Nystatin powder for candidiasis, apply to affected area 3 times a day.   HOSPITAL COURSE: The patient was directly admitted from the outpatient psychiatric clinic from [facility].  The patient had been seeing Dr. Annitta Jersey.  The patient presented that day for a regular appointment and she was felt to be manic, and therefore was referred for direct hospitalization. The patient initially was continue on home dose of Depakote. She appears to have been compliant prior to admission as her Depakote level was therapeutic. She was continued on Seroquel; however, the dose was increased from 50 mg to 100 mg in the evening and 50 mg in the morning.  The patient had limited improvement with the addition of Seroquel after several days of taking it. Therefore this medication was discontinued and instead she was started on Risperdal 2 mg p.o. b.i.d.  The dose of Risperdal was titrated up to 3 mg p.o. b.i.d.  The combination of the Risperdal with the Depakote was successful in controlling the patient's manic symptoms and also improving some of the depressive symptoms (as patient presented with mixed symptoms). Since  the patient was manic, the dose of fluoxetine that she was taking outpatient which was 40 mg daily was decreased to 20 mg p.o. at bedtime. For insomnia, the patient was started on Vistaril and the dose had to be increased from 25 to 50 mg p.o. at bedtime. As far as medical issues. The patient has obstructive sleep apnea. Towards the later part of her hospitalization, the family brought the CPAP machine and the patient used it here.  The patient also was found to have elevated blood pressure and was started on metoprolol.  The dose was titrated up to 75 mg daily of metoprolol XL. The patient also was found to have a hemoglobin A1c of 6.1 and therefore she was started on metformin 500 mg p.o. b.i.d.  The patient appeared to have candidiasis on the folds on her abdomen and below her breast and for this she received Nystatin powder.  During her hospitalization, the patient was very pleasant, respectful. She participated actively in all the groups.  However, she was very somatic and frequently had multiple concerns such as constipation, gas, hemorrhoids, blurry visions, and dizziness.  On the day of the discharge, the patient denied any physical complaints, denied any suicidality, homicidality, or psychosis.  Her mood was improved and stable, denied major issues with appetite, energy, or concentration.  She continued to complain of problems with sleep and therefore the dose of Vistaril at discharge was increased from 25 to 50 mg p.o. at bedtime.   VITAL SIGNS: Blood pressure 118/77, heart rate 59, respirations 20, and temperature 97.9.   MENTAL STATUS  EXAMINATION: The patient is a 64 year old Caucasian female who is obese and appears her stated age, displays fair grooming and hygiene.  She has  hirsutism on her face.  Behavior: She was calm, pleasant, and cooperative, friendly. Psychomotor activity within normal range. Eye contact within normal range.  Speech continues to have a slightly increased rate, but normal  tone and volume. Thought process continues to be circumstantial but improved from admission. Her mood is euthymic. Affect reactive. Insight and judgment are fair. Fund of knowledge appears to be average for her level of education. Attention and concentration appear to be average however it was not formally tested.   LABORATORY RESULTS: BUN 29, creatinine 1.37, sodium 140, potassium 4.5, calcium 9.2, AST 51, ALT is 61, hemoglobin A1c 6.1, HDL is 35 decreased, triglycerides 185, cholesterol 172, LDL cholesterol is 100, VLDL cholesterol is 37. TSH was 3.12, valproic acid on 10/13 was 96.  Urine toxicology screen was negative for illicit substances.  WBC 6.4, hemoglobin 12.5, hematocrit 38.2, platelet count is 162,000, neutrophils is 4.3. Urinalysis is clear.   DISCHARGE DISPOSITION: The patient will return home with her family.  DISCHARGE FOLLOW UP:  [facility] ACT team [phone]     ____________________________ [name], MD ahg:DT D: 05/29/2014 10:38:53 ET T: 05/29/2014 17:31:30 ET JOB#: [id number]  cc: [name], MD, <Dictator> Rhodia Albright MD ELECTRONICALLY SIGNED 05/31/2014 14:27

## 2014-11-27 NOTE — Telephone Encounter (Signed)
Dr Jimmye Norman out of the office today - will try tomorrow.

## 2014-11-27 NOTE — Telephone Encounter (Signed)
I can't dial outgoing #s kim plz call so I can speak with Dr Jimmye Norman.

## 2014-11-29 NOTE — Telephone Encounter (Signed)
Just confirming that you have called Dr. Jimmye Norman back.

## 2014-11-29 NOTE — Telephone Encounter (Signed)
I have tried calling 5 times this morning, office still has phone system off. Can we try and reach his office this morning?

## 2014-11-29 NOTE — Telephone Encounter (Signed)
Dr Jimmye Norman returned call - wanted to make [place] aware of thrombocytopenia which could be related to depakote. Updated chart and we will continue to monitor this. He may take her off if persistent thrombocytopenia.

## 2014-11-29 NOTE — Telephone Encounter (Signed)
Still unable to reach Dr Jimmye Norman.

## 2014-12-11 ENCOUNTER — Encounter: Payer: Self-pay | Admitting: Internal Medicine

## 2014-12-12 DIAGNOSIS — F319 Bipolar disorder, unspecified: Secondary | ICD-10-CM | POA: Diagnosis not present
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
## 2014-12-20 ENCOUNTER — Other Ambulatory Visit: Payer: Self-pay | Admitting: Family Medicine

## 2015-01-03 ENCOUNTER — Other Ambulatory Visit: Payer: Self-pay | Admitting: Family Medicine

## 2015-01-03 DIAGNOSIS — Z1231 Encounter for screening mammogram for malignant neoplasm of breast: Secondary | ICD-10-CM

## 2015-01-05 ENCOUNTER — Emergency Department: Payer: Medicare Other

## 2015-01-05 ENCOUNTER — Ambulatory Visit: Payer: Medicare Other | Admitting: Primary Care

## 2015-01-05 ENCOUNTER — Encounter: Payer: Self-pay | Admitting: Emergency Medicine

## 2015-01-05 ENCOUNTER — Emergency Department
Admission: EM | Admit: 2015-01-05 | Discharge: 2015-01-05 | Disposition: A | Discharge status: Home / Self Care | Payer: Medicare Other | Attending: Emergency Medicine | Admitting: Emergency Medicine

## 2015-01-05 ENCOUNTER — Other Ambulatory Visit: Payer: Self-pay

## 2015-01-05 DIAGNOSIS — R112 Nausea with vomiting, unspecified: Secondary | ICD-10-CM

## 2015-01-05 DIAGNOSIS — R001 Bradycardia, unspecified: Secondary | ICD-10-CM | POA: Diagnosis not present

## 2015-01-05 DIAGNOSIS — Z880 Allergy status to penicillin: Secondary | ICD-10-CM | POA: Insufficient documentation

## 2015-01-05 DIAGNOSIS — K6389 Other specified diseases of intestine: Secondary | ICD-10-CM | POA: Diagnosis not present

## 2015-01-05 DIAGNOSIS — I129 Hypertensive chronic kidney disease with stage 1 through stage 4 chronic kidney disease, or unspecified chronic kidney disease: Secondary | ICD-10-CM | POA: Insufficient documentation

## 2015-01-05 DIAGNOSIS — Z79899 Other long term (current) drug therapy: Secondary | ICD-10-CM | POA: Insufficient documentation

## 2015-01-05 DIAGNOSIS — R109 Unspecified abdominal pain: Secondary | ICD-10-CM | POA: Diagnosis not present

## 2015-01-05 DIAGNOSIS — Z7951 Long term (current) use of inhaled steroids: Secondary | ICD-10-CM | POA: Insufficient documentation

## 2015-01-05 DIAGNOSIS — Z7952 Long term (current) use of systemic steroids: Secondary | ICD-10-CM | POA: Diagnosis not present

## 2015-01-05 DIAGNOSIS — N183 Chronic kidney disease, stage 3 (moderate): Secondary | ICD-10-CM | POA: Insufficient documentation

## 2015-01-05 DIAGNOSIS — K529 Noninfective gastroenteritis and colitis, unspecified: Secondary | ICD-10-CM | POA: Diagnosis not present

## 2015-01-05 DIAGNOSIS — R1084 Generalized abdominal pain: Secondary | ICD-10-CM

## 2015-01-05 LAB — COMPREHENSIVE METABOLIC PANEL
ALBUMIN: 4.1 g/dL (ref 3.5–5.0)
ALT: 89 U/L — AB (ref 14–54)
ANION GAP: 10 (ref 5–15)
AST: 90 U/L — AB (ref 15–41)
Alkaline Phosphatase: 126 U/L (ref 38–126)
BILIRUBIN TOTAL: 1.4 mg/dL — AB (ref 0.3–1.2)
BUN: 30 mg/dL — ABNORMAL HIGH (ref 6–20)
CO2: 25 mmol/L (ref 22–32)
CREATININE: 1.52 mg/dL — AB (ref 0.44–1.00)
Calcium: 9.8 mg/dL (ref 8.9–10.3)
Chloride: 104 mmol/L (ref 101–111)
GFR calc Af Amer: 41 mL/min — ABNORMAL LOW (ref >60)
GFR calc non Af Amer: 35 mL/min — ABNORMAL LOW (ref >60)
Glucose, Bld: 197 mg/dL — ABNORMAL HIGH (ref 65–99)
Potassium: 5.5 mmol/L — ABNORMAL HIGH (ref 3.5–5.1)
Sodium: 139 mmol/L (ref 135–145)
Total Protein: 7.9 g/dL (ref 6.5–8.1)

## 2015-01-05 LAB — URINALYSIS COMPLETE WITH MICROSCOPIC (ARMC ONLY)
BACTERIA UA: NONE SEEN
BILIRUBIN URINE: NEGATIVE
GLUCOSE, UA: NEGATIVE mg/dL
Hgb urine dipstick: NEGATIVE
Ketones, ur: NEGATIVE mg/dL
Leukocytes, UA: NEGATIVE
Nitrite: NEGATIVE
Protein, ur: NEGATIVE mg/dL
RBC / HPF: NONE SEEN RBC/hpf (ref 0–5)
Specific Gravity, Urine: 1.006 (ref 1.005–1.030)
pH: 6 (ref 5.0–8.0)

## 2015-01-05 LAB — TROPONIN I: Troponin I: <0.03 ng/mL (ref <0.031)

## 2015-01-05 LAB — LIPASE, BLOOD: Lipase: 34 U/L (ref 22–51)

## 2015-01-05 MED ORDER — MORPHINE SULFATE 4 MG/ML IJ SOLN
4.0000 mg | Freq: Once | INTRAMUSCULAR | Status: AC
  Administered 2015-01-05: 4 mg via INTRAVENOUS

## 2015-01-05 MED ORDER — ONDANSETRON HCL 4 MG/2ML IJ SOLN
INTRAMUSCULAR | Status: AC
Start: 2015-01-05 — End: 2015-01-05
  Administered 2015-01-05: 4 mg via INTRAVENOUS
  Filled 2015-01-05: qty 2

## 2015-01-05 MED ORDER — ONDANSETRON HCL 4 MG PO TABS: 4.0000 mg | ORAL_TABLET | Freq: Three times a day (TID) | ORAL | Status: DC | PRN

## 2015-01-05 MED ORDER — CIPROFLOXACIN HCL 500 MG PO TABS
500.0000 mg | ORAL_TABLET | Freq: Once | ORAL | Status: AC
  Administered 2015-01-05: 500 mg via ORAL

## 2015-01-05 MED ORDER — MORPHINE SULFATE 4 MG/ML IJ SOLN
INTRAMUSCULAR | Status: AC
  Administered 2015-01-05: 4 mg via INTRAVENOUS
  Filled 2015-01-05: qty 1

## 2015-01-05 MED ORDER — CIPROFLOXACIN HCL 500 MG PO TABS: 500.0000 mg | ORAL_TABLET | Freq: Two times a day (BID) | ORAL | Status: DC

## 2015-01-05 MED ORDER — METRONIDAZOLE 500 MG PO TABS: 500.0000 mg | ORAL_TABLET | Freq: Two times a day (BID) | ORAL | Status: DC

## 2015-01-05 MED ORDER — DICYCLOMINE HCL 20 MG PO TABS: 20.0000 mg | ORAL_TABLET | Freq: Four times a day (QID) | ORAL | Status: DC | PRN

## 2015-01-05 MED ORDER — CIPROFLOXACIN HCL 500 MG PO TABS
ORAL_TABLET | ORAL | Status: AC
  Administered 2015-01-05: 500 mg via ORAL
  Filled 2015-01-05: qty 1

## 2015-01-05 MED ORDER — METRONIDAZOLE 500 MG PO TABS
ORAL_TABLET | ORAL | Status: AC
Start: 2015-01-05 — End: 2015-01-05
  Administered 2015-01-05: 500 mg via ORAL
  Filled 2015-01-05: qty 1

## 2015-01-05 MED ORDER — BARIUM SULFATE 2.1 % PO SUSP
450.0000 mL | ORAL | Status: DC
  Administered 2015-01-05: 450 mL via ORAL

## 2015-01-05 MED ORDER — ONDANSETRON HCL 4 MG/2ML IJ SOLN
4.0000 mg | Freq: Once | INTRAMUSCULAR | Status: AC
  Administered 2015-01-05: 4 mg via INTRAVENOUS

## 2015-01-05 MED ORDER — MORPHINE SULFATE 2 MG/ML IJ SOLN
2.0000 mg | Freq: Once | INTRAMUSCULAR | Status: AC
  Administered 2015-01-05: 2 mg via INTRAVENOUS

## 2015-01-05 MED ORDER — SODIUM CHLORIDE 0.9 % IV SOLN
1000.0000 mL | Freq: Once | INTRAVENOUS | Status: AC
  Administered 2015-01-05: 1000 mL via INTRAVENOUS

## 2015-01-05 MED ORDER — METRONIDAZOLE 500 MG PO TABS
500.0000 mg | ORAL_TABLET | Freq: Once | ORAL | Status: AC
  Administered 2015-01-05: 500 mg via ORAL

## 2015-01-05 MED ORDER — MORPHINE SULFATE 2 MG/ML IJ SOLN
INTRAMUSCULAR | Status: AC
  Administered 2015-01-05: 2 mg via INTRAVENOUS
  Filled 2015-01-05: qty 1

## 2015-01-05 NOTE — ED Provider Notes (Signed)
[facility] Emergency Department Provider Note  ____________________________________________  Time seen: Approximately 2:19 AM  I have reviewed the triage vital signs and the nursing notes.   HISTORY  Chief Complaint Abdominal Pain    HPI Gina [name] is a 64 y.o. female who presents via EMS with abdominal pain and nausea. Patient states pain began approximately 24 hours ago when she drank her second cup of coffee at 2 AM. Endorses some dry heaving with light vomiting, abdominal distention and generalized abdominal pain. Currently complains of 10/10 on radiating, aching pain. Patient denies fever, chills, chest pain, shortness of breath, headache, diarrhea, numbness, weakness, tingling. Patient states she feels constipated despite taking daily stool softener; last BM yesterday.Nothing makes the pain better or worse.   Past Medical History  Diagnosis Date  . Bipolar depression     sees psychiatrist  . DJD (degenerative joint disease), lumbar     chronic lower back pain  . Obesity (BMI 30-39.9)   . History of colon cancer     s/p surgery  . 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 OSA on CPAP     6cm H2O  . RBBB   . Anemia in chronic kidney disease   . Blood transfusion without reported diagnosis 2009  . Cataract     left  . GERD (gastroesophageal reflux disease)   . Hyperlipidemia   . Hypertension   . History of bladder cancer 1997  . History of uterine cancer     s/p hysterectomy  . CKD (chronic kidney disease) stage 3, GFR 30-59 ml/min   . Personal history of colonic adenomas and colon cancer 11/11/2012  . IDA (iron deficiency anemia) 01/2013    thought due to h/o polyps  . Positive hepatitis C antibody test 09/2014    but negative confirmatory testing    Patient Active Problem List   Diagnosis Date Noted  . History of anemia 11/27/2014  . Bipolar affective disorder, current episode manic 11/27/2014  .  Bladder cancer 11/27/2014  . History of urinary anomaly 11/27/2014  . Uterine cancer 11/27/2014  . Allergic rhinitis 11/03/2014  . Contact dermatitis 11/03/2014  . Advanced care planning/counseling discussion 10/11/2014  . Memory deficit 09/13/2014  . Positive hepatitis C antibody test 09/04/2014  . Abnormal mental state 09/04/2014  . Amnesia 09/04/2014  . Thrombocytopenia 08/12/2014  . Prediabetes 06/07/2014  . HTN (hypertension) 06/07/2014  . Trouble in sleeping 05/10/2014  . Right knee pain 03/16/2014  . Sleep apnea 02/13/2014  . Hyperlipidemia   . IDA (iron deficiency anemia) 01/26/2013  . History of colonic polyps 11/11/2012  . Recurrent falls 10/02/2012  . Nasal congestion 10/02/2012  . Medicare annual wellness visit, subsequent 08/27/2012  . Urine incontinence 08/27/2012  . Therapeutic drug monitoring 08/16/2012  . Recurrent ventral hernia 08/11/2012  . OSA on CPAP   . Intertrigo 05/27/2012  . CKD (chronic kidney disease) stage 3, GFR 30-59 ml/min   . Obesity (BMI 30-39.9)   . Colon cancer 04/07/2012  . 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 Bipolar 1 disorder 04/07/2012    Past Surgical History  Procedure Laterality Date  . Hernia repair  02/04/12  . Partial colectomy  about 2008    for colon cancer  . I & d abdominal wound  02/19/12  . Removal of infected mesh and abdominal wound vac placement  02/24/12  . Reexploration of abdominal wound and allograft placemet  02/26/12  . Partial hysterectomy  1981  uterine cancer, R ovary remains  . Left oophorectomy  2005  . Colonoscopy  07/2009  . Dexa  12/2009    WNL  . Dobutamine stress echo  12/2009    no evidence of ischemia  . Colonoscopy  11/2012    2 tubular adenomas, mild diverticulosis, pending genetic testing for Lynch syndrome [name]) rpt 2 yrs  . Breast biopsy Right 01/2014    fibroadenoma  . Sleep study  02/2014    OSA - AHI 55, nadir 81% [name])    Current Outpatient Rx  Name  Route  Sig  Dispense   Refill  . cetirizine (ZYRTEC) 10 MG tablet   Oral   Take 10 mg by mouth daily.         . cholecalciferol (VITAMIN D) 400 UNITS TABS   Oral   Take 400 Units by mouth daily.         . divalproex (DEPAKOTE ER) 500 MG 24 hr tablet   Oral   Take 2 tablets (1,000 mg total) by mouth at bedtime.         . docusate sodium (COLACE) 50 MG capsule   Oral   Take by mouth daily.         Marland Kitchen FLUoxetine (PROZAC) 20 MG capsule   Oral   Take 20 mg by mouth daily.         . fluticasone (FLONASE) 50 MCG/ACT nasal spray   Each Nare   Place 2 sprays into both nostrils daily.   16 g   6   . hydrOXYzine (ATARAX/VISTARIL) 50 MG tablet   Oral   Take 1 tablet (50 mg total) by mouth at bedtime as needed (insomnia).   30 tablet   6   . indomethacin (INDOCIN) 50 MG capsule   Oral   Take 1 capsule by mouth.         . lovastatin (MEVACOR) 40 MG tablet   Oral   Take 1 tablet (40 mg total) by mouth at bedtime.   90 tablet   3   . metFORMIN (GLUCOPHAGE) 500 MG tablet   Oral   Take 1 tablet by mouth daily.         . metoprolol succinate (TOPROL-XL) 25 MG 24 hr tablet   Oral   Take 1 tablet by mouth daily.         . metoprolol tartrate (LOPRESSOR) 25 MG tablet   Oral   Take 1.5 tablets (37.5 mg total) by mouth 2 (two) times daily.   270 tablet   3   . oxybutynin (DITROPAN-XL) 5 MG 24 hr tablet   Oral   Take 1 tablet by mouth 2 (two) times daily.         . risperiDONE (RISPERDAL) 1 MG tablet   Oral   Take 1 tablet by mouth 2 (two) times daily.         . risperiDONE (RISPERDAL) 3 MG tablet   Oral   Take 0.5 tablets by mouth 2 (two) times daily.         Marland Kitchen senna-docusate (SENOKOT-S) 8.6-50 MG per tablet   Oral   Take 1 tablet by mouth daily as needed for mild constipation.         . triamcinolone cream (KENALOG) 0.5 %   Topical   Apply 1 application topically 2 (two) times daily.   30 g   0   . VOLTAREN 1 % GEL      APPLY TO AFFECTED AREA THREE  TIMES  DAILY   100 g   1     Allergies Penicillins; Risperdal; Ivp dye; Tetanus toxoids; and Zetia  Family History  Problem Relation Age of Onset  . Cancer Mother     stomach  . Colon cancer Mother   . Cancer Sister     colon  . Colon cancer Sister   . CAD Father     MI  . Diabetes Other     aunts and uncles both sides  . Arthritis Other     strong fmhx  . Mental illness Sister     anxiety/depression  . Cancer Maternal Grandmother     breast  . Esophageal cancer Neg Hx   . Rectal cancer Neg Hx   . Stomach cancer Neg Hx     Social History History  Substance Use Topics  . Smoking status: Never Smoker   . Smokeless tobacco: Never Used  . Alcohol Use: No    Review of Systems Constitutional: No fever/chills Eyes: No visual changes. ENT: No sore throat. Cardiovascular: Denies chest pain. Respiratory: Denies shortness of breath. Gastrointestinal: Positive for abdominal pain.  Positive for nausea and vomiting.  No diarrhea.  Positive for constipation. Genitourinary: Negative for dysuria. Musculoskeletal: Negative for back pain. Skin: Negative for rash. Neurological: Negative for headaches, focal weakness or numbness.  10-point ROS otherwise negative.  ____________________________________________   PHYSICAL EXAM:  VITAL SIGNS: ED Triage Vitals  Enc Vitals Group     BP 01/05/15 0207 156/78 mmHg     Pulse Rate 01/05/15 0207 62     Resp 01/05/15 0207 21     Temp 01/05/15 0207 98.5 F (36.9 C)     Temp Source 01/05/15 0207 Oral     SpO2 01/05/15 0156 98 %     Weight 01/05/15 0207 185 lb 13.6 oz (84.3 kg)     Height 01/05/15 0207 5' 1"  (1.549 m)     Head Cir --      Peak Flow --      Pain Score 01/05/15 0208 8     Pain Loc --      Pain Edu? --      Excl. in [place]? --     Constitutional: Alert and oriented. Well appearing and in mild acute distress. Eyes: Conjunctivae are normal. PERRL. EOMI. Head: Atraumatic. Nose: No congestion/rhinnorhea. Mouth/Throat:  Mucous membranes are moist.  Oropharynx non-erythematous. Neck: No stridor.   Cardiovascular: Normal rate, regular rhythm. Grossly normal heart sounds.  Good peripheral circulation. Respiratory: Normal respiratory effort.  No retractions. Lungs CTAB. Gastrointestinal: Moderately distended, mildly tender to palpation diffusely without rebound or guarding. No abdominal bruits. No CVA tenderness. Musculoskeletal: No lower extremity tenderness nor edema.  No joint effusions. Neurologic:  Normal speech and language. No gross focal neurologic deficits are appreciated. Speech is normal.  Skin:  Skin is warm, dry and intact. No rash noted. Psychiatric: Mood and affect are normal. Speech and behavior are normal.  ____________________________________________   LABS (all labs ordered are listed, but only abnormal results are displayed)  Labs Reviewed  COMPREHENSIVE METABOLIC PANEL - Abnormal; Notable for the following:    Potassium 5.5 (*)    Glucose, Bld 197 (*)    BUN 30 (*)    Creatinine, Ser 1.52 (*)    AST 90 (*)    ALT 89 (*)    Total Bilirubin 1.4 (*)    GFR calc non Af Amer 35 (*)    GFR calc Af Amer 41 (*)  All other components within normal limits  URINALYSIS COMPLETEWITH MICROSCOPIC ([facility] ONLY) - Abnormal; Notable for the following:    Color, Urine STRAW (*)    APPearance CLEAR (*)    Squamous Epithelial / LPF 0-5 (*)    All other components within normal limits  LIPASE, BLOOD  TROPONIN I  CBC WITH DIFFERENTIAL/PLATELET   ____________________________________________  EKG  ED ECG REPORT I, SUNG,JADE J, the attending physician, personally viewed and interpreted this ECG.   Date: 01/05/2015  EKG Time: 0238  Rate: 57  Rhythm: sinus bradycardia  Axis: LAD  Intervals:right bundle branch block and left anterior fascicular block  ST&T Change: Nonspecific  ____________________________________________  RADIOLOGY  CT abdomen and pelvis with PO contrast interpreted per  Dr. Marisue Humble: Moderate length segment of small bowel thickening with adjacent inflammation in the mid abdomen. Findings are most consistent with enteritis, may be infectious or inflammatory. The adjacent bowel loops are prominent, suggesting reactive ileus. No bowel obstruction. No perforation or abscess. ____________________________________________   PROCEDURES  Procedure(s) performed: None  Critical Care performed: No  ____________________________________________   INITIAL IMPRESSION / ASSESSMENT AND PLAN / ED COURSE  Pertinent labs & imaging results that were available during my care of the patient were reviewed by me and considered in my medical decision making (see chart for details).  64 year old female presenting with abdominal pain and distention associated with vomiting. History of colon surgery, no history of obstruction. Will administer IV analgesia, antiemetic, and obtain CT abdomen/pelvis to evaluate etiology of abdominal pain.  ----------------------------------------- 5:55 AM on 01/05/2015 -----------------------------------------  Patient resting comfortably, voices no complaints at this time. Tolerated by mouth without emesis. Pain improved. Discussed with patient's CT findings of enteritis; will start empiric Cipro/Flagyl. Close follow-up with PCP. Strict return precautions given. Patient verbalizes understanding and agrees with care. ____________________________________________   FINAL CLINICAL IMPRESSION(S) / ED DIAGNOSES  Final diagnoses:  Generalized abdominal pain  Non-intractable vomiting with nausea, vomiting of unspecified type  Enteritis      Paulette Blanch, MD 01/05/15 [phone]

## 2015-01-05 NOTE — ED Notes (Signed)
Patient presents to Emergency Department via EMS with complaints of abdominal pain since yesterday, N with dry heaves and some light vomiting, pain in right lower quad and suprapubic, belly distended .

## 2015-01-05 NOTE — ED Notes (Signed)
Patient transported to CT 

## 2015-01-05 NOTE — Discharge Instructions (Signed)
1. Take antibiotics as prescribed (cipro/flagyl 500mg  twice daily x 7 days). 2. Take medicines as needed for abdominal discomfort & nausea (Bentyl/Zofran #20). 3. Return to the ER for worsening symptoms, persistent vomiting, fever, difficulty breathing or other concerns.  Abdominal Pain Many things can cause abdominal pain. Usually, abdominal pain is not caused by a disease and will improve without treatment. It can often be observed and treated at home. Your health care provider will do a physical exam and possibly order blood tests and X-rays to help determine the seriousness of your pain. However, in many cases, more time must pass before a clear cause of the pain can be found. Before that point, your health care provider may not know if you need more testing or further treatment. HOME CARE INSTRUCTIONS  Monitor your abdominal pain for any changes. The following actions may help to alleviate any discomfort you are experiencing:  Only take over-the-counter or prescription medicines as directed by your health care provider.  Do not take laxatives unless directed to do so by your health care provider.  Try a clear liquid diet (broth, tea, or water) as directed by your health care provider. Slowly move to a bland diet as tolerated. SEEK MEDICAL CARE IF:  You have unexplained abdominal pain.  You have abdominal pain associated with nausea or diarrhea.  You have pain when you urinate or have a bowel movement.  You experience abdominal pain that wakes you in the night.  You have abdominal pain that is worsened or improved by eating food.  You have abdominal pain that is worsened with eating fatty foods.  You have a fever. SEEK IMMEDIATE MEDICAL CARE IF:   Your pain does not go away within 2 hours.  You keep throwing up (vomiting).  Your pain is felt only in portions of the abdomen, such as the right side or the left lower portion of the abdomen.  You pass bloody or black tarry  stools. MAKE SURE YOU:  Understand these instructions.   Will watch your condition.   Will get help right away if you are not doing well or get worse.  Document Released: 04/30/2005 Document Revised: 07/26/2013 Document Reviewed: 03/30/2013 [facility] Patient Information 2015 [place], [place]. This information is not intended to replace advice given to you by your health care provider. Make sure you discuss any questions you have with your health care provider.  Nausea and Vomiting Nausea is a sick feeling that often comes before throwing up (vomiting). Vomiting is a reflex where stomach contents come out of your mouth. Vomiting can cause severe loss of body fluids (dehydration). Children and elderly adults can become dehydrated quickly, especially if they also have diarrhea. Nausea and vomiting are symptoms of a condition or disease. It is important to find the cause of your symptoms. CAUSES   Direct irritation of the stomach lining. This irritation can result from increased acid production (gastroesophageal reflux disease), infection, food poisoning, taking certain medicines (such as nonsteroidal anti-inflammatory drugs), alcohol use, or tobacco use.  Signals from the brain.These signals could be caused by a headache, heat exposure, an inner ear disturbance, increased pressure in the brain from injury, infection, a tumor, or a concussion, pain, emotional stimulus, or metabolic problems.  An obstruction in the gastrointestinal tract (bowel obstruction).  Illnesses such as diabetes, hepatitis, gallbladder problems, appendicitis, kidney problems, cancer, sepsis, atypical symptoms of a heart attack, or eating disorders.  Medical treatments such as chemotherapy and radiation.  Receiving medicine that makes you  sleep (general anesthetic) during surgery. DIAGNOSIS Your caregiver may ask for tests to be done if the problems do not improve after a few days. Tests may also be done if symptoms are  severe or if the reason for the nausea and vomiting is not clear. Tests may include:  Urine tests.  Blood tests.  Stool tests.  Cultures (to look for evidence of infection).  X-rays or other imaging studies. Test results can help your caregiver make decisions about treatment or the need for additional tests. TREATMENT You need to stay well hydrated. Drink frequently but in small amounts.You may wish to drink water, sports drinks, clear broth, or eat frozen ice pops or gelatin dessert to help stay hydrated.When you eat, eating slowly may help prevent nausea.There are also some antinausea medicines that may help prevent nausea. HOME CARE INSTRUCTIONS   Take all medicine as directed by your caregiver.  If you do not have an appetite, do not force yourself to eat. However, you must continue to drink fluids.  If you have an appetite, eat a normal diet unless your caregiver tells you differently.  Eat a variety of complex carbohydrates (rice, wheat, potatoes, bread), lean meats, yogurt, fruits, and vegetables.  Avoid high-fat foods because they are more difficult to digest.  Drink enough water and fluids to keep your urine clear or pale yellow.  If you are dehydrated, ask your caregiver for specific rehydration instructions. Signs of dehydration may include:  Severe thirst.  Dry lips and mouth.  Dizziness.  Dark urine.  Decreasing urine frequency and amount.  Confusion.  Rapid breathing or pulse. SEEK IMMEDIATE MEDICAL CARE IF:   You have blood or brown flecks (like coffee grounds) in your vomit.  You have black or bloody stools.  You have a severe headache or stiff neck.  You are confused.  You have severe abdominal pain.  You have chest pain or trouble breathing.  You do not urinate at least once every 8 hours.  You develop cold or clammy skin.  You continue to vomit for longer than 24 to 48 hours.  You have a fever. MAKE SURE YOU:   Understand these  instructions.  Will watch your condition.  Will get help right away if you are not doing well or get worse. Document Released: 07/21/2005 Document Revised: 10/13/2011 Document Reviewed: 12/18/2010 [facility] Patient Information 2015 [place], [place]. This information is not intended to replace advice given to you by your health care provider. Make sure you discuss any questions you have with your health care provider.

## 2015-01-08 ENCOUNTER — Telehealth: Payer: Self-pay | Admitting: Family Medicine

## 2015-01-08 NOTE — Telephone Encounter (Signed)
Pt has appt on 01/09/15 at 9:15 with Webb Silversmith NP.

## 2015-01-08 NOTE — Telephone Encounter (Signed)
[facility] Patient Name: Gina [name] A DOB: 1951/06/26 Initial Comment Caller states right foot hurting, red and swelling, thinks it is gout Nurse Assessment Nurse: Donalynn Furlong, RN, Myna Hidalgo Date/Time Eilene Ghazi Time): 01/08/2015 10:57:29 AM Confirm and document reason for call. If symptomatic, describe symptoms. ---Caller states right foot hurting, red and swelling, thinks it is gout. Started Sat morning. Afebrile. Was in hospital this past Friday for potential "bowel blockage". Bowels have been moving well since Friday/no additional problems Has the patient traveled out of the country within the last 30 days? ---No Does the patient require triage? ---Yes Related visit to physician within the last 2 weeks? ---Yes Does the PT have any chronic conditions? (i.e. diabetes, asthma, etc.) ---Yes List chronic conditions. ---HBP, high cholesterol, anxiety Guidelines Guideline Title Affirmed Question Affirmed Notes Foot Pain [1] Redness of the skin AND [2] no fever Final Disposition User See Physician within [place], Therapist, sports, Myna Hidalgo

## 2015-01-09 ENCOUNTER — Encounter: Payer: Self-pay | Admitting: Internal Medicine

## 2015-01-09 ENCOUNTER — Ambulatory Visit (INDEPENDENT_AMBULATORY_CARE_PROVIDER_SITE_OTHER): Payer: Medicare Other | Admitting: Internal Medicine

## 2015-01-09 VITALS — BP 120/78 | HR 55 | Temp 98.4°F | Wt 179.0 lb

## 2015-01-09 DIAGNOSIS — B372 Candidiasis of skin and nail: Secondary | ICD-10-CM

## 2015-01-09 DIAGNOSIS — J309 Allergic rhinitis, unspecified: Secondary | ICD-10-CM | POA: Diagnosis not present

## 2015-01-09 DIAGNOSIS — M10371 Gout due to renal impairment, right ankle and foot: Secondary | ICD-10-CM | POA: Diagnosis not present

## 2015-01-09 MED ORDER — NYSTATIN 100000 UNIT/GM EX POWD: Freq: Four times a day (QID) | CUTANEOUS | Status: DC

## 2015-01-09 MED ORDER — INDOMETHACIN 50 MG PO CAPS: 50.0000 mg | ORAL_CAPSULE | Freq: Two times a day (BID) | ORAL | Status: DC

## 2015-01-09 NOTE — Patient Instructions (Addendum)
I have called in a medication for the gout in your right foot. Take the Indomethacin twice a day with food until your pain, redness and swelling resolves After your foot stops hurting, please call to make a lab appt to have your uric acid level drawn, this will let us know for sure if you have gout. Avoid red meat, fish and alcohol. I have called in a powder for the rash to your left groin. Use the Nystatin as directed until rash resolves. Continue to use Flonase for your runny nose.  Gout Gout is an inflammatory arthritis caused by a buildup of uric acid crystals in the joints. Uric acid is a chemical that is normally present in the blood. When the level of uric acid in the blood is too high it can form crystals that deposit in your joints and tissues. This causes joint redness, soreness, and swelling (inflammation). Repeat attacks are common. Over time, uric acid crystals can form into masses (tophi) near a joint, destroying bone and causing disfigurement. Gout is treatable and often preventable. CAUSES  The disease begins with elevated levels of uric acid in the blood. Uric acid is produced by your body when it breaks down a naturally found substance called purines. Certain foods you eat, such as meats and fish, contain high amounts of purines. Causes of an elevated uric acid level include:  Being passed down from parent to child (heredity).  Diseases that cause increased uric acid production (such as obesity, psoriasis, and certain cancers).  Excessive alcohol use.  Diet, especially diets rich in meat and seafood.  Medicines, including certain cancer-fighting medicines (chemotherapy), water pills (diuretics), and aspirin.  Chronic kidney disease. The kidneys are no longer able to remove uric acid well.  Problems with metabolism. Conditions strongly associated with gout include:  Obesity.  High blood pressure.  High cholesterol.  Diabetes. Not everyone with elevated uric acid  levels gets gout. It is not understood why some people get gout and others do not. Surgery, joint injury, and eating too much of certain foods are some of the factors that can lead to gout attacks. SYMPTOMS   An attack of gout comes on quickly. It causes intense pain with redness, swelling, and warmth in a joint.  Fever can occur.  Often, only one joint is involved. Certain joints are more commonly involved:  Base of the big toe.  Knee.  Ankle.  Wrist.  Finger. Without treatment, an attack usually goes away in a few days to weeks. Between attacks, you usually will not have symptoms, which is different from many other forms of arthritis. DIAGNOSIS  Your caregiver will suspect gout based on your symptoms and exam. In some cases, tests may be recommended. The tests may include:  Blood tests.  Urine tests.  X-rays.  Joint fluid exam. This exam requires a needle to remove fluid from the joint (arthrocentesis). Using a microscope, gout is confirmed when uric acid crystals are seen in the joint fluid. TREATMENT  There are two phases to gout treatment: treating the sudden onset (acute) attack and preventing attacks (prophylaxis).  Treatment of an Acute Attack.  Medicines are used. These include anti-inflammatory medicines or steroid medicines.  An injection of steroid medicine into the affected joint is sometimes necessary.  The painful joint is rested. Movement can worsen the arthritis.  You may use warm or cold treatments on painful joints, depending which works best for you.  Treatment to Prevent Attacks.  If you suffer from frequent gout  attacks, your caregiver may advise preventive medicine. These medicines are started after the acute attack subsides. These medicines either help your kidneys eliminate uric acid from your body or decrease your uric acid production. You may need to stay on these medicines for a very long time.  The early phase of treatment with preventive  medicine can be associated with an increase in acute gout attacks. For this reason, during the first few months of treatment, your caregiver may also advise you to take medicines usually used for acute gout treatment. Be sure you understand your caregiver's directions. Your caregiver may make several adjustments to your medicine dose before these medicines are effective.  Discuss dietary treatment with your caregiver or dietitian. Alcohol and drinks high in sugar and fructose and foods such as meat, poultry, and seafood can increase uric acid levels. Your caregiver or dietitian can advise you on drinks and foods that should be limited. HOME CARE INSTRUCTIONS   Do not take aspirin to relieve pain. This raises uric acid levels.  Only take over-the-counter or prescription medicines for pain, discomfort, or fever as directed by your caregiver.  Rest the joint as much as possible. When in bed, keep sheets and blankets off painful areas.  Keep the affected joint raised (elevated).  Apply warm or cold treatments to painful joints. Use of warm or cold treatments depends on which works best for you.  Use crutches if the painful joint is in your leg.  Drink enough fluids to keep your urine clear or pale yellow. This helps your body get rid of uric acid. Limit alcohol, sugary drinks, and fructose drinks.  Follow your dietary instructions. Pay careful attention to the amount of protein you eat. Your daily diet should emphasize fruits, vegetables, whole grains, and fat-free or low-fat milk products. Discuss the use of coffee, vitamin C, and cherries with your caregiver or dietitian. These may be helpful in lowering uric acid levels.  Maintain a healthy body weight. SEEK MEDICAL CARE IF:   You develop diarrhea, vomiting, or any side effects from medicines.  You do not feel better in 24 hours, or you are getting worse. SEEK IMMEDIATE MEDICAL CARE IF:   Your joint becomes suddenly more tender, and you  have chills or a fever. MAKE SURE YOU:   Understand these instructions.  Will watch your condition.  Will get help right away if you are not doing well or get worse. Document Released: 07/18/2000 Document Revised: 12/05/2013 Document Reviewed: 03/03/2012 [facility] Patient Information 2015 [place], [place]. This information is not intended to replace advice given to you by your health care provider. Make sure you discuss any questions you have with your health care provider.

## 2015-01-09 NOTE — Progress Notes (Signed)
Subjective:    Patient ID: Gina [name], female    DOB: 07/07/1951, 64 y.o.   MRN: [id number]  HPI  Pt presents to the clinic today with c/o pain in redness in her right foot. This started 3- 4 days ago. She denies any injury to the area. The area is very painful, she has trouble even wearing shoes. She reports she has had gout in the past and thinks this is the same thing. She did take Indomethacin prior, but reports she is out of that medication. She has not tried anything OTC. She does have CKD stage 3. She does eat red meats but does avoid fish and alcohol.  She also c/o a rash to her left groin. She noticed this 1 week ago. The rash burns. She has not tried anything OTC. She has had a rash like this in the past but cannot remember what it was or how it was treated.  She also c/o a runny nose. This has been an ongoing issue. She is blowing clear mucous out of her nose. She denies ear pain or sore throat. She denies fever, chills or body aches. She has taken Zyrtec without any relief. She is also taking Flonase daily which seems to help.   Review of Systems      Past Medical History  Diagnosis Date  . Bipolar depression     sees psychiatrist  . DJD (degenerative joint disease), lumbar     chronic lower back pain  . Obesity (BMI 30-39.9)   . History of colon cancer     s/p surgery  . 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 OSA on CPAP     6cm H2O  . RBBB   . Anemia in chronic kidney disease   . Blood transfusion without reported diagnosis 2009  . Cataract     left  . GERD (gastroesophageal reflux disease)   . Hyperlipidemia   . Hypertension   . History of bladder cancer 1997  . History of uterine cancer     s/p hysterectomy  . CKD (chronic kidney disease) stage 3, GFR 30-59 ml/min   . Personal history of colonic adenomas and colon cancer 11/11/2012  . IDA (iron deficiency anemia) 01/2013    thought due to h/o polyps  . Positive  hepatitis C antibody test 09/2014    but negative confirmatory testing    Current Outpatient Prescriptions  Medication Sig Dispense Refill  . cetirizine (ZYRTEC) 10 MG tablet Take 10 mg by mouth daily.    . cholecalciferol (VITAMIN D) 400 UNITS TABS Take 400 Units by mouth daily.    . ciprofloxacin (CIPRO) 500 MG tablet Take 1 tablet (500 mg total) by mouth 2 (two) times daily. 14 tablet 0  . dicyclomine (BENTYL) 20 MG tablet Take 1 tablet (20 mg total) by mouth every 6 (six) hours as needed. 20 tablet 0  . divalproex (DEPAKOTE ER) 500 MG 24 hr tablet Take 2 tablets (1,000 mg total) by mouth at bedtime.    . docusate sodium (COLACE) 50 MG capsule Take by mouth daily.    Marland Kitchen FLUoxetine (PROZAC) 20 MG capsule Take 20 mg by mouth daily.    . fluticasone (FLONASE) 50 MCG/ACT nasal spray Place 2 sprays into both nostrils daily. 16 g 6  . hydrOXYzine (ATARAX/VISTARIL) 50 MG tablet Take 1 tablet (50 mg total) by mouth at bedtime as needed (insomnia). 30 tablet 6  . indomethacin (INDOCIN) 50 MG capsule  Take 1 capsule by mouth.    . lovastatin (MEVACOR) 40 MG tablet Take 1 tablet (40 mg total) by mouth at bedtime. 90 tablet 3  . metFORMIN (GLUCOPHAGE) 500 MG tablet Take 1 tablet by mouth daily.    . metoprolol succinate (TOPROL-XL) 25 MG 24 hr tablet Take 1 tablet by mouth daily.    . metoprolol tartrate (LOPRESSOR) 25 MG tablet Take 1.5 tablets (37.5 mg total) by mouth 2 (two) times daily. 270 tablet 3  . metroNIDAZOLE (FLAGYL) 500 MG tablet Take 1 tablet (500 mg total) by mouth 2 (two) times daily. 14 tablet 0  . ondansetron (ZOFRAN) 4 MG tablet Take 1 tablet (4 mg total) by mouth every 8 (eight) hours as needed for nausea or vomiting. 20 tablet 1  . oxybutynin (DITROPAN-XL) 5 MG 24 hr tablet Take 1 tablet by mouth 2 (two) times daily.    . risperiDONE (RISPERDAL) 1 MG tablet Take 1 tablet by mouth 2 (two) times daily.    . risperiDONE (RISPERDAL) 3 MG tablet Take 0.5 tablets by mouth 2 (two) times  daily.    Marland Kitchen senna-docusate (SENOKOT-S) 8.6-50 MG per tablet Take 1 tablet by mouth daily as needed for mild constipation.    . triamcinolone cream (KENALOG) 0.5 % Apply 1 application topically 2 (two) times daily. 30 g 0  . VOLTAREN 1 % GEL APPLY TO AFFECTED AREA THREE TIMES DAILY 100 g 1   No current facility-administered medications for this visit.    Allergies  Allergen Reactions  . Penicillins     "Knocker out for awhile" per the pt  . Risperdal [Risperidone] Other (See Comments)    Memory loss/AMS evaluated by Dr Melrose Nakay[facility] at [place]  . Ivp Dye [Iodinated Diagnostic Agents] Rash  . Tetanus Toxoids Rash    Small rash, not hives, no swelling.  Wants to try again.  Marland Kitchen Zetia [Ezetimibe] Rash    Family History  Problem Relation Age of Onset  . Cancer Mother     stomach  . Colon cancer Mother   . Cancer Sister     colon  . Colon cancer Sister   . CAD Father     MI  . Diabetes Other     aunts and uncles both sides  . Arthritis Other     strong fmhx  . Mental illness Sister     anxiety/depression  . Cancer Maternal Grandmother     breast  . Esophageal cancer Neg Hx   . Rectal cancer Neg Hx   . Stomach cancer Neg Hx     History   Social History  . Marital Status: Single    Spouse Name: N/A  . Number of Children: N/A  . Years of Education: N/A   Occupational History  . Not on file.   Social History Main Topics  . Smoking status: Never Smoker   . Smokeless tobacco: Never Used  . Alcohol Use: No  . Drug Use: No  . Sexual Activity: No   Other Topics Concern  . Not on file   Social History Narrative   Lives with sister, no pets   Occupation: disabled, for bipolar and arthritis   Edu: GED   Activity: take walks   Diet: good water, vegetables daily      Psych - [place], Dr. Jimmye Norman (ph [phone])     Constitutional: Denies fever, malaise, fatigue, headache or abrupt weight changes.  HEENT: Pt reports runny nose. Denies ear pain,  nasal congestion or sore throat. Respiratory: Denies difficulty breathing, shortness of breath, cough or sputum production.   Cardiovascular: Denies chest pain, chest tightness, palpitations or swelling in the hands or feet.  Musculoskeletal: Pt reports pain in right foot. Denies decrease in range of motion, difficulty with gait, muscle pain or joint swelling.  Skin: Pt reports redness of right foot and rash in left groin. Denies lesions or ulcercations.  Neurological: Denies numbness or tingling in the feer, or problems with balance and coordination.   No other specific complaints in a complete review of systems (except as listed in HPI above).  Objective:   Physical Exam   BP 120/78 mmHg  Pulse 55  Temp(Src) 98.4 F (36.9 C) (Oral)  Wt 179 lb (81.194 kg)  SpO2 97% Wt Readings from Last 3 Encounters:  01/09/15 179 lb (81.194 kg)  01/05/15 185 lb 13.6 oz (84.3 kg)  11/22/14 189 lb 3 oz (85.816 kg)    General: Appears her stated age, obese in NAD. Skin: Redness noted of 1st MTP of right foot. Erythematous rash with white coating noted in left groin. HEENT: Head: normal shape and size, no sinus tenderness noted;  Nose: mucosa boggy and moist, septum midline;  Neck: No adenopathy noted. Cardiovascular: Normal rate and rhythm. S1,S2 noted.   Pulmonary/Chest: Normal effort and positive vesicular breath sounds. No respiratory distress. No wheezes, rales or ronchi noted.  Musculoskeletal: Decreased flexion and extension of right great toe. Pain with palpation of the 1st MTP. Mild swelling noted. She is limping while walking. Neurological: Alert and oriented. Sensation intact to BLE.  BMET    Component Value Date/Time   NA 139 01/05/2015 0220   NA 140 05/16/2014 1314   NA 142 01/06/2013   K 5.5* 01/05/2015 0220   K 4.5 05/16/2014 1314   K 5.5 01/06/2013   CL 104 01/05/2015 0220   CL 103 05/16/2014 1314   CO2 25 01/05/2015 0220   CO2 28 05/16/2014 1314   GLUCOSE 197* 01/05/2015  0220   GLUCOSE 125* 05/16/2014 1314   BUN 30* 01/05/2015 0220   BUN 29* 05/16/2014 1314   CREATININE 1.52* 01/05/2015 0220   CREATININE 1.37* 05/16/2014 1314   CREATININE 1.99 01/06/2013   CALCIUM 9.8 01/05/2015 0220   CALCIUM 9.2 05/16/2014 1314   CALCIUM 9.5 01/06/2013   GFRNONAA 35* 01/05/2015 0220   GFRAA 41* 01/05/2015 0220    Lipid Panel     Component Value Date/Time   CHOL 247* 08/09/2014 1012   CHOL 247 08/09/2014   TRIG 192* 08/09/2014 1012   TRIG 192 08/09/2014   HDL 40 08/09/2014 1012   CHOLHDL 5 03/10/2014 0803   VLDL 30.2 03/10/2014 0803   LDLCALC 169 08/09/2014   LDLCALC 105* 03/10/2014 0803    CBC    Component Value Date/Time   WBC 5.9 11/15/2014 0943   WBC 5.9 11/15/2014 0943   WBC 6.0 10/09/2014 0857   RBC 4.19 11/15/2014 0943   RBC 3.91 10/09/2014 0857   RBC 3.85* 04/05/2013 1541   HGB 12.8 11/15/2014 0943   HGB 12.1 10/09/2014 0857   HGB 11.8 08/09/2014   HCT 38.0 11/15/2014 0943   HCT 35.2* 10/09/2014 0857   PLT 122* 11/15/2014 0943   PLT 122* 11/15/2014 0943   MCV 91 11/15/2014 0943   MCV 90.1 10/09/2014 0857   MCV 83.0 01/06/2013   MCH 30.5 11/15/2014 0943   MCH 31.2 05/10/2012 0630   MCHC 33.6 11/15/2014 0943   MCHC 34.3 10/09/2014  0857   RDW 13.0 11/15/2014 0943   RDW 13.7 10/09/2014 0857   LYMPHSABS 1.7 11/15/2014 0943   LYMPHSABS 1.9 10/09/2014 0857   MONOABS 0.7 11/15/2014 0943   MONOABS 0.8 10/09/2014 0857   EOSABS 0.1 11/15/2014 0943   EOSABS 0.1 10/09/2014 0857   BASOSABS 0.0 11/15/2014 0943   BASOSABS 0.0 10/09/2014 0857    Hgb A1C Lab Results  Component Value Date   HGBA1C 5.6 04/14/2012        Assessment & Plan:   Acute gout of right foot:  ? If this is related to her kidney impairment Creatinine 1.52, BUN 30, GFR 35 (01/05/2015) Refilled Indomethacin, take BID until pain and swelling resolves Once symptoms have resolve, make lab appt to have your uric acid level checked- I have already ordered  this  Candida Intertrigo:  Try to keep the area dry and free of moisture Pat dry after bathing, avoid rubbing Ok to use gauze in the area during the day to keep it dry eRx for Nystatin powder to affected area BID until rash resolves:  Allergic Rhinitis:  Continue Flonase for now  RTC as needed or if symptoms persist or worsen

## 2015-01-09 NOTE — Progress Notes (Signed)
Pre visit review using our clinic review tool, if applicable. No additional management support is needed unless otherwise documented below in the visit note. 

## 2015-01-10 ENCOUNTER — Encounter: Payer: Self-pay | Admitting: Psychiatry

## 2015-01-10 ENCOUNTER — Ambulatory Visit (INDEPENDENT_AMBULATORY_CARE_PROVIDER_SITE_OTHER): Payer: Medicare Other | Admitting: Psychiatry

## 2015-01-10 ENCOUNTER — Other Ambulatory Visit: Payer: Self-pay

## 2015-01-10 VITALS — BP 122/84 | HR 60 | Temp 97.6°F | Ht 61.0 in

## 2015-01-10 DIAGNOSIS — F3170 Bipolar disorder, currently in remission, most recent episode unspecified: Secondary | ICD-10-CM

## 2015-01-10 MED ORDER — QUETIAPINE FUMARATE 50 MG PO TABS: ORAL_TABLET | ORAL | Status: DC

## 2015-01-10 NOTE — Progress Notes (Signed)
[place] MD/PA/NP OP Progress Note  01/10/2015 8:59 AM Gina [name]  MRN:  [id number]  Subjective:  Patient returns for follow-up for bipolar disorder. She states overall things are going pretty well. She states that one exception is that she did come to the emergency room one week ago for some GI disturbance. She states she was told it was a GI infection and she has been prescribed antibiotics which she expects to finish tomorrow.  Her mood she feels like it's been stable. She denies experiencing any depression or any psychotic symptoms. She states she is tolerating the Seroquel well and does not have any complaints with it. She states she continues to have her routine of getting up doing things around the house and walking her dog.  Chief Complaint:  Chief Complaint    Follow-up     Visit Diagnosis:     ICD-9-CM ICD-10-CM   1. Bipolar disorder in full remission, most recent episode unspecified type 296.80 F31.70 QUEtiapine (SEROQUEL) 50 MG tablet    Past Medical History:  Past Medical History  Diagnosis Date  . Bipolar depression     sees psychiatrist  . DJD (degenerative joint disease), lumbar     chronic lower back pain  . Obesity (BMI 30-39.9)   . History of colon cancer     s/p surgery  . 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 OSA on CPAP     6cm H2O  . RBBB   . Anemia in chronic kidney disease   . Blood transfusion without reported diagnosis 2009  . Cataract     left  . GERD (gastroesophageal reflux disease)   . Hyperlipidemia   . Hypertension   . History of bladder cancer 1997  . History of uterine cancer     s/p hysterectomy  . CKD (chronic kidney disease) stage 3, GFR 30-59 ml/min   . Personal history of colonic adenomas and colon cancer 11/11/2012  . IDA (iron deficiency anemia) 01/2013    thought due to h/o polyps  . Positive hepatitis C antibody test 09/2014    but negative confirmatory testing    Past Surgical History   Procedure Laterality Date  . Hernia repair  02/04/12  . Partial colectomy  about 2008    for colon cancer  . I & d abdominal wound  02/19/12  . Removal of infected mesh and abdominal wound vac placement  02/24/12  . Reexploration of abdominal wound and allograft placemet  02/26/12  . Partial hysterectomy  1981    uterine cancer, R ovary remains  . Left oophorectomy  2005  . Colonoscopy  07/2009  . Dexa  12/2009    WNL  . Dobutamine stress echo  12/2009    no evidence of ischemia  . Colonoscopy  11/2012    2 tubular adenomas, mild diverticulosis, pending genetic testing for Lynch syndrome [name]) rpt 2 yrs  . Breast biopsy Right 01/2014    fibroadenoma  . Sleep study  02/2014    OSA - [facility] 55, nadir 81% [name])   Family History:  Family History  Problem Relation Age of Onset  . Cancer Mother     stomach  . Colon cancer Mother   . Cancer Sister     colon  . Colon cancer Sister   . CAD Father     MI  . Diabetes Other     aunts and uncles both sides  . Arthritis Other  strong fmhx  . Mental illness Sister     anxiety/depression  . Cancer Maternal Grandmother     breast  . Esophageal cancer Neg Hx   . Rectal cancer Neg Hx   . Stomach cancer Neg Hx    Social History:  History   Social History  . Marital Status: Single    Spouse Name: N/A  . Number of Children: N/A  . Years of Education: N/A   Social History Main Topics  . Smoking status: Never Smoker   . Smokeless tobacco: Never Used  . Alcohol Use: No  . Drug Use: No  . Sexual Activity: No   Other Topics Concern  . None   Social History Narrative   Lives with sister, no pets   Occupation: disabled, for bipolar and arthritis   Edu: GED   Activity: take walks   Diet: good water, vegetables daily      Psych - [place], Dr. Jimmye Norman (ph [phone])   Additional History:   Assessment:   Musculoskeletal: Strength & Muscle Tone: within normal limits Gait & Station: Patient has normal  gait but ambulates slowly Patient leans: N/A  Psychiatric Specialty Exam: HPI  Review of Systems  Psychiatric/Behavioral: Negative for depression, suicidal ideas, hallucinations, memory loss and substance abuse. The patient is not nervous/anxious and does not have insomnia.     Blood pressure 122/84, pulse 60, temperature 97.6 F (36.4 C), temperature source Tympanic, height 5\' 1"  (1.549 m), SpO2 96 %.There is no weight on file to calculate BMI.  General Appearance: Well Groomed  Eye Contact:  Good  Speech:  Clear and Coherent and Garbled  Volume:  Normal  Mood:  Good  Affect:  Congruent  Thought Process:  Goal Directed, Linear and Logical  Orientation:  Full (Time, Place, and Person)  Thought Content:  Negative  Suicidal Thoughts:  No  Homicidal Thoughts:  No  Memory:  Immediate;   Good Recent;   Good Remote;   Fair  Judgement:  Good  Insight:  Good  Psychomotor Activity:  Negative  Concentration:  Good  Recall:  [place] of Knowledge: Fair  Language: Good  Akathisia:  Negative  Handed:  Right unknown  AIMS (if indicated):  Normal- Done on 12/12/14 in [employer]  Assets:  Desire for Improvement  ADL's:  Intact  Cognition: WNL  Sleep:  Good   Is the patient at risk to self?  No. Has the patient been a risk to self in the past 6 months?  No. Has the patient been a risk to self within the distant past?  No. Is the patient a risk to others?  No. Has the patient been a risk to others in the past 6 months?  No. Has the patient been a risk to others within the distant past?  No.  Current Medications: Current Outpatient Prescriptions  Medication Sig Dispense Refill  . cholecalciferol (VITAMIN D) 400 UNITS TABS Take 400 Units by mouth daily.    . ciprofloxacin (CIPRO) 500 MG tablet Take 1 tablet (500 mg total) by mouth 2 (two) times daily. 14 tablet 0  . dicyclomine (BENTYL) 20 MG tablet Take 1 tablet (20 mg total) by mouth every 6 (six) hours as needed. 20 tablet 0  .  docusate sodium (COLACE) 50 MG capsule Take by mouth daily.    Marland Kitchen FLUoxetine (PROZAC) 20 MG capsule Take 20 mg by mouth daily.    . fluticasone (FLONASE) 50 MCG/ACT nasal spray Place 2  sprays into both nostrils daily. 16 g 6  . hydrOXYzine (ATARAX/VISTARIL) 50 MG tablet Take 1 tablet (50 mg total) by mouth at bedtime as needed (insomnia). 30 tablet 6  . indomethacin (INDOCIN) 50 MG capsule Take 1 capsule (50 mg total) by mouth 2 (two) times daily with a meal. 60 capsule 0  . lovastatin (MEVACOR) 40 MG tablet Take 1 tablet (40 mg total) by mouth at bedtime. 90 tablet 3  . metFORMIN (GLUCOPHAGE) 500 MG tablet Take 1 tablet by mouth daily.    . metoprolol succinate (TOPROL-XL) 25 MG 24 hr tablet Take 1 tablet by mouth daily.    . metoprolol tartrate (LOPRESSOR) 25 MG tablet Take 1.5 tablets (37.5 mg total) by mouth 2 (two) times daily. 270 tablet 3  . metroNIDAZOLE (FLAGYL) 500 MG tablet Take 1 tablet (500 mg total) by mouth 2 (two) times daily. 14 tablet 0  . nystatin (MYCOSTATIN) powder Apply topically 4 (four) times daily. 15 g 0  . ondansetron (ZOFRAN) 4 MG tablet Take 1 tablet (4 mg total) by mouth every 8 (eight) hours as needed for nausea or vomiting. 20 tablet 1  . oxybutynin (DITROPAN-XL) 5 MG 24 hr tablet Take 1 tablet by mouth 2 (two) times daily.    . QUEtiapine (SEROQUEL) 50 MG tablet Take 1 and 1/2 tablets at bedtime for 7 days and then increase to 2 tablets at bedtime. 60 tablet 4  . senna-docusate (SENOKOT-S) 8.6-50 MG per tablet Take 1 tablet by mouth daily as needed for mild constipation.    . VOLTAREN 1 % GEL APPLY TO AFFECTED AREA THREE TIMES DAILY 100 g 1  . divalproex (DEPAKOTE ER) 500 MG 24 hr tablet     . risperiDONE (RISPERDAL) 1 MG tablet Take 1 tablet by mouth 2 (two) times daily.    . risperiDONE (RISPERDAL) 3 MG tablet Take 0.5 tablets by mouth 2 (two) times daily.    Marland Kitchen triamcinolone cream (KENALOG) 0.5 %      No current facility-administered medications for this visit.     Medical Decision Making:  Established Problem, Stable/Improving (1)  Treatment Plan Summary:Medication management We'll continue to titrate the Seroquel. Will increase her dose from 50 mg at bedtime to 75 mg at bedtime for 7 days and then she will go to 100 mg at bedtime. She will follow up in 3 months. She's been encouraged call with any questions or concerns prior to her next appointment.  Faith Rogue 01/10/2015, 8:59 AM

## 2015-01-12 ENCOUNTER — Ambulatory Visit
Admission: RE | Admit: 2015-01-12 | Discharge: 2015-01-12 | Disposition: A | Discharge status: Home / Self Care | Payer: Medicare Other | Source: Ambulatory Visit | Attending: Family Medicine | Admitting: Family Medicine

## 2015-01-12 ENCOUNTER — Other Ambulatory Visit: Payer: Self-pay | Admitting: Family Medicine

## 2015-01-12 ENCOUNTER — Encounter: Payer: Self-pay | Admitting: *Deleted

## 2015-01-12 DIAGNOSIS — Z1231 Encounter for screening mammogram for malignant neoplasm of breast: Secondary | ICD-10-CM

## 2015-01-12 LAB — HM MAMMOGRAPHY: HM Mammogram: NORMAL

## 2015-01-16 ENCOUNTER — Ambulatory Visit: Payer: Medicare Other

## 2015-01-17 ENCOUNTER — Encounter (INDEPENDENT_AMBULATORY_CARE_PROVIDER_SITE_OTHER): Payer: Self-pay

## 2015-01-17 ENCOUNTER — Ambulatory Visit (AMBULATORY_SURGERY_CENTER): Payer: Self-pay

## 2015-01-17 VITALS — Ht 61.0 in | Wt 191.8 lb

## 2015-01-17 DIAGNOSIS — C189 Malignant neoplasm of colon, unspecified: Secondary | ICD-10-CM

## 2015-01-17 NOTE — Progress Notes (Signed)
No allergies to eggs or soy No past problems with anesthesia No diet/weight loss meds No home oxygen  No computer

## 2015-01-26 ENCOUNTER — Encounter: Payer: Self-pay | Admitting: Family Medicine

## 2015-01-26 ENCOUNTER — Telehealth: Payer: Self-pay

## 2015-01-26 ENCOUNTER — Ambulatory Visit (INDEPENDENT_AMBULATORY_CARE_PROVIDER_SITE_OTHER): Payer: Medicare Other | Admitting: Family Medicine

## 2015-01-26 VITALS — BP 112/68 | HR 54 | Temp 98.0°F | Wt 188.2 lb

## 2015-01-26 DIAGNOSIS — N183 Chronic kidney disease, stage 3 unspecified: Secondary | ICD-10-CM

## 2015-01-26 DIAGNOSIS — D696 Thrombocytopenia, unspecified: Secondary | ICD-10-CM | POA: Diagnosis not present

## 2015-01-26 DIAGNOSIS — M10371 Gout due to renal impairment, right ankle and foot: Secondary | ICD-10-CM

## 2015-01-26 DIAGNOSIS — I10 Essential (primary) hypertension: Secondary | ICD-10-CM | POA: Diagnosis not present

## 2015-01-26 DIAGNOSIS — F319 Bipolar disorder, unspecified: Secondary | ICD-10-CM

## 2015-01-26 DIAGNOSIS — J3089 Other allergic rhinitis: Secondary | ICD-10-CM

## 2015-01-26 DIAGNOSIS — L304 Erythema intertrigo: Secondary | ICD-10-CM | POA: Diagnosis not present

## 2015-01-26 LAB — CBC WITH DIFFERENTIAL/PLATELET
BASOS ABS: 0 10*3/uL (ref 0.0–0.1)
Basophils Relative: 0.4 % (ref 0.0–3.0)
Eosinophils Absolute: 0.1 10*3/uL (ref 0.0–0.7)
Eosinophils Relative: 1.1 % (ref 0.0–5.0)
HCT: 35.7 % — ABNORMAL LOW (ref 36.0–46.0)
HEMOGLOBIN: 12 g/dL (ref 12.0–15.0)
LYMPHS PCT: 29.3 % (ref 12.0–46.0)
Lymphs Abs: 1.9 10*3/uL (ref 0.7–4.0)
MCHC: 33.7 g/dL (ref 30.0–36.0)
MCV: 89.3 fl (ref 78.0–100.0)
MONOS PCT: 8 % (ref 3.0–12.0)
Monocytes Absolute: 0.5 10*3/uL (ref 0.1–1.0)
NEUTROS PCT: 61.2 % (ref 43.0–77.0)
Neutro Abs: 3.9 10*3/uL (ref 1.4–7.7)
Platelets: 149 10*3/uL — ABNORMAL LOW (ref 150.0–400.0)
RBC: 4 Mil/uL (ref 3.87–5.11)
RDW: 13.5 % (ref 11.5–15.5)
WBC: 6.3 10*3/uL (ref 4.0–10.5)

## 2015-01-26 LAB — COMPREHENSIVE METABOLIC PANEL
ALT: 80 U/L — ABNORMAL HIGH (ref 0–35)
AST: 75 U/L — ABNORMAL HIGH (ref 0–37)
Albumin: 4.2 g/dL (ref 3.5–5.2)
Alkaline Phosphatase: 117 U/L (ref 39–117)
BUN: 32 mg/dL — AB (ref 6–23)
CALCIUM: 9.8 mg/dL (ref 8.4–10.5)
CO2: 29 meq/L (ref 19–32)
CREATININE: 1.44 mg/dL — AB (ref 0.40–1.20)
Chloride: 104 mEq/L (ref 96–112)
GFR: 38.87 mL/min — AB (ref >60.00)
Glucose, Bld: 101 mg/dL — ABNORMAL HIGH (ref 70–99)
Potassium: 4.9 mEq/L (ref 3.5–5.1)
Sodium: 139 mEq/L (ref 135–145)
Total Bilirubin: 0.6 mg/dL (ref 0.2–1.2)
Total Protein: 7.2 g/dL (ref 6.0–8.3)

## 2015-01-26 LAB — URIC ACID: Uric Acid, Serum: 7.7 mg/dL — ABNORMAL HIGH (ref 2.4–7.0)

## 2015-01-26 LAB — TSH: TSH: 3.92 u[IU]/mL (ref 0.35–4.50)

## 2015-01-26 MED ORDER — AZELASTINE HCL 0.1 % NA SOLN: 1.0000 | Freq: Two times a day (BID) | NASAL | Status: DC

## 2015-01-26 MED ORDER — NYSTATIN 100000 UNIT/GM EX CREA: 1.0000 "application " | TOPICAL_CREAM | Freq: Two times a day (BID) | CUTANEOUS | Status: DC

## 2015-01-26 NOTE — Progress Notes (Signed)
Pre visit review using our clinic review tool, if applicable. No additional management support is needed unless otherwise documented below in the visit note. 

## 2015-01-26 NOTE — Patient Instructions (Addendum)
Try astelin nasal spray sent to pharmacy. For skin rash under arms - use nystatin cream and let us know if not improved with this. Blood work today. We will check gout and platelets. Return as needed or in 4 months for follow up visit.

## 2015-01-26 NOTE — Telephone Encounter (Signed)
Ok to do thanks. 

## 2015-01-26 NOTE — Progress Notes (Addendum)
BP 112/68 mmHg  Pulse 54  Temp(Src) 98 F (36.7 C) (Oral)  Wt 188 lb 4 oz (85.39 kg)  SpO2 98%   CC: f/u visit  Subjective:    Patient ID: Gina [name], female    DOB: 06-21-1951, 64 y.o.   MRN: [id number]  HPI: ALEXXIS MACKERT is a 64 y.o. female presenting on 01/26/2015 for Follow-up   Rpt colonoscopy scheduled for 6/29.  To get upper dental plate next month.  Watery rhinorrhea out of nose. Zyrtec didn't help, flonase didn't help. Interested in [place] nasal spray.  Rash underarms itchy and burns. No pain. Deodorant causes worse burning. Has tried   Bipolar - off depakote and now on seroquel 100mg  nightly folowed by psych. depakote stopped 2/2 concern for thrombocytopenia.   Relevant past medical, surgical, family and social history reviewed and updated as indicated. Interim medical history since our last visit reviewed. Allergies and medications reviewed and updated. Current Outpatient Prescriptions on File Prior to Visit  Medication Sig  . cholecalciferol (VITAMIN D) 400 UNITS TABS Take 400 Units by mouth daily.  Marland Kitchen FLUoxetine (PROZAC) 20 MG capsule Take 20 mg by mouth daily.  . fluticasone (FLONASE) 50 MCG/ACT nasal spray Place 2 sprays into both nostrils daily.  . hydrOXYzine (ATARAX/VISTARIL) 50 MG tablet Take 1 tablet (50 mg total) by mouth at bedtime as needed (insomnia).  . lovastatin (MEVACOR) 40 MG tablet Take 1 tablet (40 mg total) by mouth at bedtime.  . metoprolol tartrate (LOPRESSOR) 25 MG tablet Take 1.5 tablets (37.5 mg total) by mouth 2 (two) times daily.  . QUEtiapine (SEROQUEL) 50 MG tablet Take 1 and 1/2 tablets at bedtime for 7 days and then increase to 2 tablets at bedtime.  . VOLTAREN 1 % GEL APPLY TO AFFECTED AREA THREE TIMES DAILY   No current facility-administered medications on file prior to visit.    Review of Systems Per HPI unless specifically indicated above     Objective:    BP 112/68 mmHg  Pulse 54  Temp(Src) 98 F  (36.7 C) (Oral)  Wt 188 lb 4 oz (85.39 kg)  SpO2 98%  Wt Readings from Last 3 Encounters:  01/26/15 188 lb 4 oz (85.39 kg)  01/17/15 191 lb 12.8 oz (87 kg)  01/09/15 179 lb (81.194 kg)    Physical Exam  Constitutional: She appears well-developed and well-nourished. No distress.  HENT:  Mouth/Throat: Oropharynx is clear and moist. No oropharyngeal exudate.  Eyes: Conjunctivae and EOM are normal. Pupils are equal, round, and reactive to light.  Cardiovascular: Normal rate, regular rhythm, normal heart sounds and intact distal pulses.   No murmur heard. Pulmonary/Chest: Effort normal and breath sounds normal. No respiratory distress. She has no wheezes. She has no rales.  Musculoskeletal: She exhibits no edema.  Skin: Skin is warm and dry. Rash noted.  Erythematous rash with few satellite lesions bilateral axillae  Psychiatric: She has a normal mood and affect.  pleasant  Nursing note and vitals reviewed.  Results for orders placed or performed in visit on 01/12/15  [facility] MAMMOGRAPHY  Result Value Ref Range   [facility] Mammogram Normal-Birads1-Repeat 1 year       Assessment & Plan:   Problem List Items Addressed This Visit    Allergic rhinitis    Will trial astelin nasal spray for persistent clear rhinorrhea      Bipolar 1 disorder    Off depakote. risperdal caused memory troubles. Now on seroquel nightly. Check glucose today. Continue f/u  with psych. Recheck plt.      CKD (chronic kidney disease) stage 3, GFR 30-59 ml/min    Recheck levels today.      HTN (hypertension)   Relevant Orders   TSH   Intertrigo    Candidal intertrigo bilateral axillae - restart nystatin cream sent to pharmacy      Thrombocytopenia - Primary    Recheck platelets today. ?depakote related, off for last 1+ month.      Relevant Orders   Comprehensive metabolic panel   CBC with Differential/Platelet   TSH    Other Visit Diagnoses    Acute gout due to renal impairment involving right foot              Follow up plan: Return in about 4 months (around 05/28/2015), or if symptoms worsen or fail to improve, for follow up visit.

## 2015-01-26 NOTE — Assessment & Plan Note (Addendum)
Recheck platelets today. ?depakote related, off for last 1+ month.

## 2015-01-26 NOTE — Assessment & Plan Note (Signed)
Candidal intertrigo bilateral axillae - restart nystatin cream sent to pharmacy

## 2015-01-26 NOTE — Telephone Encounter (Signed)
[place] at [place] notified.

## 2015-01-26 NOTE — Assessment & Plan Note (Signed)
Will trial astelin nasal spray for persistent clear rhinorrhea

## 2015-01-26 NOTE — Assessment & Plan Note (Signed)
Recheck levels today

## 2015-01-26 NOTE — Telephone Encounter (Signed)
Gina [name] from [place] left v/m; Gina [name] does not have nystatin cream and wants verbal permission to change to nystatin ointment.Please advise.

## 2015-01-26 NOTE — Assessment & Plan Note (Addendum)
Off depakote. risperdal caused memory troubles. Now on seroquel nightly. Check glucose today. Continue f/u with psych. Recheck plt.

## 2015-01-29 ENCOUNTER — Other Ambulatory Visit: Payer: Self-pay | Admitting: Internal Medicine

## 2015-01-29 ENCOUNTER — Encounter: Payer: Self-pay | Admitting: *Deleted

## 2015-01-29 MED ORDER — ALLOPURINOL 100 MG PO TABS: 100.0000 mg | ORAL_TABLET | Freq: Every day | ORAL | Status: DC

## 2015-01-31 ENCOUNTER — Telehealth: Payer: Self-pay

## 2015-01-31 ENCOUNTER — Other Ambulatory Visit: Payer: Self-pay

## 2015-01-31 ENCOUNTER — Ambulatory Visit (AMBULATORY_SURGERY_CENTER): Payer: Medicare Other | Admitting: Internal Medicine

## 2015-01-31 ENCOUNTER — Encounter: Payer: Self-pay | Admitting: Internal Medicine

## 2015-01-31 VITALS — BP 121/68 | HR 54 | Temp 97.4°F | Resp 30 | Ht 61.0 in | Wt 191.0 lb

## 2015-01-31 DIAGNOSIS — Z85038 Personal history of other malignant neoplasm of large intestine: Secondary | ICD-10-CM | POA: Diagnosis not present

## 2015-01-31 DIAGNOSIS — D123 Benign neoplasm of transverse colon: Secondary | ICD-10-CM | POA: Diagnosis not present

## 2015-01-31 DIAGNOSIS — N189 Chronic kidney disease, unspecified: Secondary | ICD-10-CM | POA: Diagnosis not present

## 2015-01-31 DIAGNOSIS — I10 Essential (primary) hypertension: Secondary | ICD-10-CM | POA: Diagnosis not present

## 2015-01-31 DIAGNOSIS — Z8542 Personal history of malignant neoplasm of other parts of uterus: Secondary | ICD-10-CM

## 2015-01-31 DIAGNOSIS — C19 Malignant neoplasm of rectosigmoid junction: Secondary | ICD-10-CM

## 2015-01-31 DIAGNOSIS — K635 Polyp of colon: Secondary | ICD-10-CM | POA: Diagnosis not present

## 2015-01-31 DIAGNOSIS — D509 Iron deficiency anemia, unspecified: Secondary | ICD-10-CM | POA: Diagnosis not present

## 2015-01-31 DIAGNOSIS — G4733 Obstructive sleep apnea (adult) (pediatric): Secondary | ICD-10-CM | POA: Diagnosis not present

## 2015-01-31 MED ORDER — SODIUM CHLORIDE 0.9 % IV SOLN: 500.0000 mL | INTRAVENOUS | Status: DC

## 2015-01-31 NOTE — Patient Instructions (Addendum)
I found and removed one small polyp. I will let you know pathology results and when to have another routine colonoscopy by mail.  I still think it would make sense for you to have genetic testing - will see if we can rearrange that.  I appreciate the opportunity to care for you. Gatha Mayer, MD, FACG  YOU HAD AN ENDOSCOPIC PROCEDURE TODAY AT [facility]:   Refer to the procedure report that was given to you for any specific questions about what was found during the examination.  If the procedure report does not answer your questions, please call your gastroenterologist to clarify.  If you requested that your care partner not be given the details of your procedure findings, then the procedure report has been included in a sealed envelope for you to review at your convenience later.  YOU SHOULD EXPECT: Some feelings of bloating in the abdomen. Passage of more gas than usual.  Walking can help get rid of the air that was put into your GI tract during the procedure and reduce the bloating. If you had a lower endoscopy (such as a colonoscopy or flexible sigmoidoscopy) you may notice spotting of blood in your stool or on the toilet paper. If you underwent a bowel prep for your procedure, you may not have a normal bowel movement for a few days.  Please Note:  You might notice some irritation and congestion in your nose or some drainage.  This is from the oxygen used during your procedure.  There is no need for concern and it should clear up in a day or so.  SYMPTOMS TO REPORT IMMEDIATELY:   Following lower endoscopy (colonoscopy or flexible sigmoidoscopy):  Excessive amounts of blood in the stool  Significant tenderness or worsening of abdominal pains  Swelling of the abdomen that is new, acute  Fever of 100F or higher   For urgent or emergent issues, a gastroenterologist can be reached at any hour by calling [phone].   DIET: Your first meal following the  procedure should be a small meal and then it is ok to progress to your normal diet. Heavy or fried foods are harder to digest and may make you feel nauseous or bloated.  Likewise, meals heavy in dairy and vegetables can increase bloating.  Drink plenty of fluids but you should avoid alcoholic beverages for 24 hours.  ACTIVITY:  You should plan to take it easy for the rest of today and you should NOT DRIVE or use heavy machinery until tomorrow (because of the sedation medicines used during the test).    FOLLOW UP: Our staff will call the number listed on your records the next business day following your procedure to check on you and address any questions or concerns that you may have regarding the information given to you following your procedure. If we do not reach you, we will leave a message.  However, if you are feeling well and you are not experiencing any problems, there is no need to return our call.  We will assume that you have returned to your regular daily activities without incident.  If any biopsies were taken you will be contacted by phone or by letter within the next 1-3 weeks.  Please call us at [phone] if you have not heard about the biopsies in 3 weeks.    SIGNATURES/CONFIDENTIALITY: You and/or your care partner have signed paperwork which will be entered into your electronic medical record.  These signatures  attest to the fact that that the information above on your After Visit Summary has been reviewed and is understood.  Full responsibility of the confidentiality of this discharge information lies with you and/or your care-partner.

## 2015-01-31 NOTE — Progress Notes (Signed)
Stable to RR 

## 2015-01-31 NOTE — Progress Notes (Signed)
Called to room to assist during endoscopic procedure.  Patient ID and intended procedure confirmed with present staff. Received instructions for my participation in the procedure from the performing physician.  

## 2015-01-31 NOTE — Op Note (Signed)
[facility]  [address] [place], [id number]   COLONOSCOPY PROCEDURE REPORT  PATIENT: Gina [name], Gina [name]  MR#: [id number] BIRTHDATE: 1951/03/10 , 46  yrs. old GENDER: female ENDOSCOPIST: Gatha Mayer, MD, [facility] PROCEDURE DATE:  01/31/2015 PROCEDURE:   Colonoscopy with biopsy and Colonoscopy, surveillance First Screening Colonoscopy - Avg.  risk and is 50 yrs.  old or older - No.  Prior Negative Screening - Now for repeat screening. N/A  History of Adenoma - Now for follow-up colonoscopy & has been > or = to 3 yrs.  No.  It has been less than 3 yrs since last colonoscopy.  Other: See Comments  Polyps removed today? Yes ASA CLASS:   Class III INDICATIONS:Surveillance due to prior colonic neoplasia, PH & FH Colon or Rectal Adenocarcinoma, and PH Colon Adenoma. MEDICATIONS: Propofol 250 mg IV, Monitored anesthesia care, and Lidocaine 40 mg IV  DESCRIPTION OF PROCEDURE:   After the risks benefits and alternatives of the procedure were thoroughly explained, informed consent was obtained.  The digital rectal exam revealed no abnormalities of the rectum.   The LB [id number]  endoscope was introduced through the anus and advanced to the cecum, which was identified by both the appendix and ileocecal valve. No adverse events experienced.   The quality of the prep was good.  (MiraLax was used)  The instrument was then slowly withdrawn as the colon was fully examined. Estimated blood loss is zero unless otherwise noted in this procedure report.  COLON FINDINGS: A sessile polyp measuring 2 mm in size was found in the transverse colon.  A polypectomy was performed with cold forceps.  The resection was complete, the polyp tissue was completely retrieved and sent to histology.   There was mild diverticulosis noted in the sigmoid colon.   There was evidence of a prior end-to-end colo-colonic surgical anastomosis in the transverse colon.   The examination was otherwise  normal. Retroflexed views revealed no abnormalities. The time to cecum = 5.8 Withdrawal time = 14.7   The scope was withdrawn and the procedure completed. COMPLICATIONS: There were no immediate complications.  ENDOSCOPIC IMPRESSION: 1.   Sessile polyp was found in the transverse colon; polypectomy was performed with cold forceps 2.   Mild diverticulosis was noted in the sigmoid colon 3.   There was evidence of a prior colo-colonic surgical anastomosis in the transverse colon 4.   The examination was otherwise normal - good prep  RECOMMENDATIONS: Timing of repeat colonoscopy will be determined by pathology findings.  likely 2 yrs again as she has PHx CRCA and uterine Ca and FHx also so I have suspected Lynch Syndrome. will try again to get her to genetic testing was arranged in past but she thought it was another form of psych counseling so didnot go  eSigned:  Gatha Mayer, MD, [facility] 01/31/2015 12:05 PM  cc: The Patient

## 2015-01-31 NOTE — Telephone Encounter (Signed)
Per procedure report from today patient needs genetics referral for possible lynch syndrome.  Referral entered Left message for patient to call back

## 2015-01-31 NOTE — Progress Notes (Signed)
Red raised rash to inner right wrist, patient states she has had this rash for a couple of days., she is allergic to something she has come in contact with. Has been  Putting medication on it.

## 2015-02-01 ENCOUNTER — Telehealth: Payer: Self-pay | Admitting: Emergency Medicine

## 2015-02-01 ENCOUNTER — Telehealth: Payer: Self-pay

## 2015-02-01 NOTE — Telephone Encounter (Signed)
Pt left v/m; pt still has yeast infection under arms and med given for yeast infection is not working; pt also started to break out on hands (red areas) and pt wants to know if should use same med that she used last time when hands broke out. Pt request cb.

## 2015-02-01 NOTE — Telephone Encounter (Signed)
Patient was given triamcinolone for her hands. Ok to use per Dr. Darnell Level. Patient notified and verbalized understanding.

## 2015-02-01 NOTE — Telephone Encounter (Signed)
  Follow up Call-  Call back number 01/31/2015 11/08/2012  Post procedure Call Back phone  # [phone] hm  Permission to leave phone message Yes Yes     Patient questions:  Do you have a fever, pain , or abdominal swelling? No. Pain Score  0 *  Have you tolerated food without any problems? Yes.    Have you been able to return to your normal activities? Yes.    Do you have any questions about your discharge instructions: Diet   No. Medications  No. Follow up visit  No.  Do you have questions or concerns about your Care? No.  Actions: * If pain score is 4 or above: No action needed, pain <4.

## 2015-02-01 NOTE — Telephone Encounter (Signed)
Would continue using nystatin cream for 1 more week and update [place] with effect. Use BID. May just need more time to be effective. What hand rash did she previously use?

## 2015-02-01 NOTE — Telephone Encounter (Signed)
Patient advised that she will get a call about setting up appt from the cancer center.  She verbalized understanding.

## 2015-02-02 HISTORY — PX: COLONOSCOPY: SHX174

## 2015-02-06 ENCOUNTER — Encounter: Payer: Self-pay | Admitting: Family Medicine

## 2015-02-06 ENCOUNTER — Encounter: Payer: Self-pay | Admitting: Internal Medicine

## 2015-02-06 DIAGNOSIS — Z86010 Personal history of colonic polyps: Secondary | ICD-10-CM

## 2015-02-06 NOTE — Progress Notes (Signed)
Quick Note:  2 mm adenoma - repeat colon 2018 (? Lynch syndrome) ______

## 2015-02-07 ENCOUNTER — Telehealth: Payer: Self-pay | Admitting: Genetic Counselor

## 2015-02-07 NOTE — Telephone Encounter (Signed)
Gina [name] 02/21/15 1 pm  Dx: hx of uterine cancer        hx of colon cancer Referring: [facility]

## 2015-02-09 ENCOUNTER — Telehealth: Payer: Self-pay | Admitting: *Deleted

## 2015-02-09 NOTE — Telephone Encounter (Signed)
Spoke with patient. She said she still has the rash on her arm and is almost out of the ointment. Do you want her to continue it or try something else? It isn't any worse, but isn't any better either.

## 2015-02-10 MED ORDER — NYSTATIN 100000 UNIT/GM EX CREA: 1.0000 "application " | TOPICAL_CREAM | Freq: Two times a day (BID) | CUTANEOUS | Status: DC

## 2015-02-10 MED ORDER — NYSTATIN 100000 UNIT/GM EX OINT: 1.0000 "application " | TOPICAL_OINTMENT | Freq: Two times a day (BID) | CUTANEOUS | Status: DC

## 2015-02-10 NOTE — Telephone Encounter (Signed)
Let's continue nystatin ointment - sent refill to pharmacy. Let us know in 2 wks effect.

## 2015-02-12 NOTE — Telephone Encounter (Signed)
Patient notified and verbalized understanding. 

## 2015-02-12 NOTE — Telephone Encounter (Signed)
Patient is scheduled for 02/21/15 with Genetic counselor

## 2015-02-21 ENCOUNTER — Encounter: Payer: Medicare Other | Admitting: Genetic Counselor

## 2015-02-21 ENCOUNTER — Other Ambulatory Visit: Payer: Medicare Other

## 2015-02-28 ENCOUNTER — Encounter: Payer: Self-pay | Admitting: Genetic Counselor

## 2015-02-28 ENCOUNTER — Ambulatory Visit (HOSPITAL_BASED_OUTPATIENT_CLINIC_OR_DEPARTMENT_OTHER): Payer: Medicare Other | Admitting: Genetic Counselor

## 2015-02-28 ENCOUNTER — Other Ambulatory Visit: Payer: Medicare Other

## 2015-02-28 DIAGNOSIS — Z803 Family history of malignant neoplasm of breast: Secondary | ICD-10-CM

## 2015-02-28 DIAGNOSIS — Z800 Family history of malignant neoplasm of digestive organs: Secondary | ICD-10-CM

## 2015-02-28 DIAGNOSIS — Z1379 Encounter for other screening for genetic and chromosomal anomalies: Secondary | ICD-10-CM | POA: Insufficient documentation

## 2015-02-28 DIAGNOSIS — Z315 Encounter for genetic counseling: Secondary | ICD-10-CM | POA: Diagnosis not present

## 2015-02-28 DIAGNOSIS — Z8542 Personal history of malignant neoplasm of other parts of uterus: Secondary | ICD-10-CM

## 2015-02-28 DIAGNOSIS — Z86010 Personal history of colonic polyps: Secondary | ICD-10-CM

## 2015-02-28 DIAGNOSIS — C55 Malignant neoplasm of uterus, part unspecified: Secondary | ICD-10-CM

## 2015-02-28 DIAGNOSIS — Z85038 Personal history of other malignant neoplasm of large intestine: Secondary | ICD-10-CM

## 2015-02-28 NOTE — Progress Notes (Signed)
REFERRING PROVIDER: Ria Bush, MD [place], [place] [address]   Silvano Rusk, MD  PRIMARY PROVIDER:  Ria Bush, MD  PRIMARY REASON FOR VISIT:  1. History of colon cancer   2. History of uterine cancer   3. Family history of colon cancer   4. Family history of breast cancer   5. Family history of stomach cancer   6. History of colonic polyps   7. Uterine cancer      HISTORY OF PRESENT ILLNESS:   [name], a 64 y.o. female, was seen for a [place] cancer genetics consultation at the request of Dr. [name] due to a personal and family history of cancer.  [name] presents to clinic today to discuss the possibility of a hereditary predisposition to cancer, genetic testing, and to further clarify her future cancer risks, as well as potential cancer risks for family members.   In the 1980s-1990s, at the age of 9s-30s, Gina [name] was diagnosed with both colon and uterine cancer.  She was diagnosed and treated up in [place].  Gina [name] is not a very good historian so she was not able to provide many details of her diagnosis.  Never had genetic testing. Patient had two colonoscopies in [place] where a total of 3 tubular adenoma's were found. She reports having several other colonoscopies "up [place]" and a "few" polyps were found.   CANCER HISTORY:   No history exists.     HORMONAL RISK FACTORS:  Menarche was at age unsure when she started - reports starting in her teens, then her periods were stopped by her MDs and then she had a hysterectomy in her late 61s due to cancer.  First live birth at age N/A.  OCP use for approximately 0 years.  Ovaries intact: unsure if her ovaries were taken in the hysterectomy.  Hysterectomy: yes.  Menopausal status: postmenopausal.  HRT use: 0 years. Colonoscopy: yes; abnormal. Mammogram within the last year: yes. Number of breast biopsies: 1. Up to date with pelvic exams:  yes. Any excessive  radiation exposure in the past:  no  Past Medical History  Diagnosis Date  . Bipolar depression     sees psychiatrist  . DJD (degenerative joint disease), lumbar     chronic lower back pain  . Obesity (BMI 30-39.9)   . History of colon cancer     s/p surgery  . 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 OSA on CPAP     6cm H2O  . RBBB   . Anemia in chronic kidney disease   . Blood transfusion without reported diagnosis 2009  . Cataract     left  . GERD (gastroesophageal reflux disease)   . Hyperlipidemia   . Hypertension   . History of bladder cancer 1997  . History of uterine cancer     s/p hysterectomy  . CKD (chronic kidney disease) stage 3, GFR 30-59 ml/min   . Personal history of colonic adenomas and colon cancer 11/11/2012  . IDA (iron deficiency anemia) 01/2013    thought due to h/o polyps  . Positive hepatitis C antibody test 09/2014    but negative confirmatory testing  . Colon cancer 1990's  . Family history of breast cancer   . Family history of colon cancer   . Family history of stomach cancer     Past Surgical History  Procedure Laterality Date  . Hernia repair  02/04/12  . Partial colectomy  about 2008    for colon cancer  . I & d abdominal wound  02/19/12  . Removal of infected mesh and abdominal wound vac placement  02/24/12  . Reexploration of abdominal wound and allograft placemet  02/26/12  . Partial hysterectomy  1981    uterine cancer, R ovary remains  . Left oophorectomy  2005  . Colonoscopy  07/2009  . Dexa  12/2009    WNL  . Dobutamine stress echo  12/2009    no evidence of ischemia  . Colonoscopy  11/2012    2 tubular adenomas, mild diverticulosis, pending genetic testing for Lynch syndrome [name]) rpt 2 yrs  . Sleep study  02/2014    OSA - [facility] 55, nadir 81% [name])  . Breast biopsy Right 01/2014    fibroadenoma  . Colonoscopy  02/2015    TA, diverticulosis, rpt 2 yrs [name])    History   Social History  .  Marital Status: Single    Spouse Name: N/A  . Number of Children: 0  . Years of Education: N/A   Social History Main Topics  . Smoking status: Never Smoker   . Smokeless tobacco: Never Used  . Alcohol Use: No  . Drug Use: No  . Sexual Activity: No   Other Topics Concern  . Not on file   Social History Narrative   Lives with sister, no pets   Occupation: disabled, for bipolar and arthritis   Edu: GED   Activity: take walks   Diet: good water, vegetables daily      Psych - [place], Dr. Jimmye Norman (ph [phone])     FAMILY HISTORY:  We obtained a detailed, 4-generation family history.  Significant diagnoses are listed below: Family History  Problem Relation Age of Onset  . Colon cancer Mother 50  . Stomach cancer Mother     dx in her 82s?  Marland Kitchen Colon cancer Sister 59    Maternal half sister  . CAD Father     MI  . Diabetes Other     aunts and uncles both sides  . Arthritis Other     strong fmhx  . Mental illness Sister     anxiety/depression  . Breast cancer Maternal Grandmother   . Esophageal cancer Neg Hx   . Rectal cancer Neg Hx    The patient has two full siblings and two maternal half siblings.  One maternal sister died of colon cancer at age 52-57.  The other siblings have not had cancer, and she is unsure if they have had colonoscopies or polyps.  Her mother was the second to youngest of 8 children and was raised in an orphanage.  Her mother was diagnosed with colon cancer in her 29s and stomach cancer in her 73s.  One maternal aunt is living, all other of her mother's siblings are deceased.  She is unaware of other cancers in her mother's siblings.  Gina [name] maternal grandmother was diagnosed and died of breast cancer "young", which caused her mother to go into an orphanage.  Gina [name] father died of a heart attack at 67.  He was one of 13 children, and the patient is unaware of cancer other than a possible cousin with a cancer NOS.   Patient's maternal ancestors are of [place] descent, and paternal ancestors are of [place] descent. There is no reported Ashkenazi Jewish ancestry. There is no known consanguinity.  GENETIC COUNSELING ASSESSMENT: ZENOVIA JUSTMAN is a 64 y.o. female  with a personal and family history of colon polyps and cancer which somewhat suggestive of a Lynch syndrome and predisposition to cancer. We, therefore, discussed and recommended the following at today's visit.   DISCUSSION: Ms. Mancebo reports a personal history of uterine cancer in her 82s-30s and a diagnosis of colon cancer diagnosed around the same time.  Based on her personal history of tubular adenomas and cancer history, she is at risk for hereditayr mutations within the Lynch syndrome genes.  We reviewed Lynch syndrome and management changes based on this diagnosis.  Other hereditary mutations within the POLE and POLD1 genes can also be associated with this type of personal and family history.  We reviewed the characteristics, features and inheritance patterns of hereditary cancer syndromes. We also discussed genetic testing, including the appropriate family members to test, the process of testing, insurance coverage and turn-around-time for results. We discussed the implications of a negative, positive and/or variant of uncertain significant result. We recommended Ms. Poulton pursue genetic testing for the colorectal cancer gene panel. The Colorectal Cancer Panel offered by [facility] includes sequencing and/or duplication/deletion testing of the following 19 genes: APC, ATM, AXIN2, BMPR1A, CDH1, CHEK2, EPCAM, MLH1, MSH2, MSH6, MUTYH, PMS2, POLD1, POLE, PTEN, SCG5/GREM1, SMAD4, STK11, and TP53.     Based on Ms. Kallenberger's personal and family history of cancer, she meets medical criteria for genetic testing. Despite that she meets criteria, she may still have an out of pocket cost.  We discussed that if her out of pocket cost for testing is over $100,  the laboratory will call and confirm whether she wants to proceed with testing.  If the out of pocket cost of testing is less than $100 she will be billed by the genetic testing laboratory.   In order to estimate her chance of having a MMR mutation, we used statistical models (PREM1.2.6) and laboratory data that take into account her personal medical history, family history and ancestry.  Because each model is different, there can be a lot of variability in the risks they give.  Therefore, these numbers must be considered a rough range and not a precise risk of having a MMR mutation.  This model estimates that she has approximately a 75% chance of having a mutation (MLH1: 19.8%; MSH2: 53.2%; MSH6: 2.8%). Based on this assessment of her family and personal history, genetic testing is recommended.  PLAN: After considering the risks, benefits, and limitations, Ms. Ibe  provided informed consent to pursue genetic testing and the blood sample was sent to [facility] for analysis of the Colorectal Cancer Panel. Results should be available within approximately 2-3 weeks' time, at which point they will be disclosed by telephone to Ms. Muecke, as will any additional recommendations warranted by these results. Ms. Kuehne will receive a summary of her genetic counseling visit and a copy of her results once available. This information will also be available in Epic. We encouraged Ms. Ingerson to remain in contact with cancer genetics annually so that we can continuously update the family history and inform her of any changes in cancer genetics and testing that may be of benefit for her family. Ms. Martenson questions were answered to her satisfaction today. Our contact information was provided should additional questions or concerns arise.  Lastly, we encouraged Ms. Wogan to remain in contact with cancer genetics annually so that we can continuously update the family history and inform her of  any changes in cancer genetics and testing that may be of benefit for  this family.   Ms.  Perkins questions were answered to her satisfaction today. Our contact information was provided should additional questions or concerns arise. Thank you for the referral and allowing [place] to share in the care of your patient.   Donyell Carrell P. Florene Glen, [place], [facility] Certified Genetic Counselor [email or web address] phone: [phone]  The patient was seen for a total of 35 minutes in face-to-face genetic counseling.  This patient was discussed with Drs. Magrinat, Lindi Adie and/or Burr Medico who agrees with the above.    _______________________________________________________________________ For Office Staff:  Number of people involved in session: 1 Was an Intern/ student involved with case: no

## 2015-03-01 DIAGNOSIS — Z8542 Personal history of malignant neoplasm of other parts of uterus: Secondary | ICD-10-CM | POA: Diagnosis not present

## 2015-03-01 DIAGNOSIS — Z803 Family history of malignant neoplasm of breast: Secondary | ICD-10-CM | POA: Diagnosis not present

## 2015-03-01 DIAGNOSIS — Z85038 Personal history of other malignant neoplasm of large intestine: Secondary | ICD-10-CM | POA: Diagnosis not present

## 2015-03-01 DIAGNOSIS — Z800 Family history of malignant neoplasm of digestive organs: Secondary | ICD-10-CM | POA: Diagnosis not present

## 2015-03-21 ENCOUNTER — Encounter: Payer: Self-pay | Admitting: Genetic Counselor

## 2015-03-21 ENCOUNTER — Telehealth: Payer: Self-pay | Admitting: Genetic Counselor

## 2015-03-21 NOTE — Telephone Encounter (Signed)
Revealed negative genetic test results but that we found two VUS.  Patient had colon and uterine cancer in [place], [place].

## 2015-03-22 ENCOUNTER — Ambulatory Visit: Payer: Self-pay | Admitting: Genetic Counselor

## 2015-03-22 DIAGNOSIS — C679 Malignant neoplasm of bladder, unspecified: Secondary | ICD-10-CM

## 2015-03-22 DIAGNOSIS — Z803 Family history of malignant neoplasm of breast: Secondary | ICD-10-CM

## 2015-03-22 DIAGNOSIS — C55 Malignant neoplasm of uterus, part unspecified: Secondary | ICD-10-CM

## 2015-03-22 DIAGNOSIS — C189 Malignant neoplasm of colon, unspecified: Secondary | ICD-10-CM

## 2015-03-22 DIAGNOSIS — Z800 Family history of malignant neoplasm of digestive organs: Secondary | ICD-10-CM

## 2015-03-22 DIAGNOSIS — Z1379 Encounter for other screening for genetic and chromosomal anomalies: Secondary | ICD-10-CM

## 2015-03-22 NOTE — Progress Notes (Signed)
HPI: Gina [name] was previously seen in the [facility] clinic due to a personal and family history of cancer and concerns regarding a hereditary predisposition to cancer. Please refer to our prior cancer genetics clinic note for more information regarding Ms. Polich's medical, social and family histories, and our assessment and recommendations, at the time. Ms. Brosch recent genetic test results were disclosed to her, as were recommendations warranted by these results. These results and recommendations are discussed in more detail below.  FAMILY HISTORY:  We obtained a detailed, 4-generation family history.  Significant diagnoses are listed below: Family History  Problem Relation Age of Onset  . Colon cancer Mother 66  . Stomach cancer Mother     dx in her 11s?  Marland Kitchen Colon cancer Sister 66    Maternal half sister  . CAD Father     MI  . Diabetes Other     aunts and uncles both sides  . Arthritis Other     strong fmhx  . Mental illness Sister     anxiety/depression  . Breast cancer Maternal Grandmother   . Esophageal cancer Neg Hx   . Rectal cancer Neg Hx    The patient was diagnosed with both colon and uterine cancer in her 67s.  She has a maternal half sister who had colon cancer at 39, and her mother was diagnosed with colon cancer and gastric cancer.  Her mother had 8 brothers and sisters who reportedly did not have cancer.  Her maternal grandmother had breast cancer.  Patient's maternal ancestors are of [place] descent, and paternal ancestors are of [place] descent. There is no reported Ashkenazi Jewish ancestry. There is no known consanguinity.  GENETIC TEST RESULTS: At the time of Ms. Lokey's visit, we recommended she pursue genetic testing of the Colorectal Cancer gene panel. The Colorectal Cancer Panel offered by [facility] includes sequencing and/or duplication/deletion testing of the following 19 genes: APC, ATM, AXIN2, BMPR1A, CDH1, CHEK2, EPCAM, MLH1,  MSH2, MSH6, MUTYH, PMS2, POLD1, POLE, PTEN, SCG5/GREM1, SMAD4, STK11, and TP53.  The report date is March 19, 2015.  Genetic testing was normal, and did not reveal a deleterious mutation in these genes. The test report has been scanned into EPIC and is located under the Molecular pathology section of the Results Review tab.   We discussed with Ms. Eiland that since the current genetic testing is not perfect, it is possible there may be a gene mutation in one of these genes that current testing cannot detect, but that chance is small. We also discussed, that it is possible that another gene that has not yet been discovered, or that we have not yet tested, is responsible for the cancer diagnoses in the family, and it is, therefore, important to remain in touch with cancer genetics in the future so that we can continue to offer Ms. Runquist the most up to date genetic testing.   Genetic testing did detect two Variants of Unknown Significance, one in the MUTYH gene called c.[id number]>T and the other in the PMS2 gene called c.983A>G. At this time, it is unknown if these variants are associated with increased cancer risk or if this is a normal finding, but most variants such as this get reclassified to being inconsequential. It should not be used to make medical management decisions. With time, we suspect the lab will determine the significance of this variant, if any. If we do learn more about it, we will try to contact Ms. Arp to discuss  it further. However, it is important to stay in touch with [place] periodically and keep the address and phone number up to date.   CANCER SCREENING RECOMMENDATIONS: Given Ms. Strawderman's personal and family histories, we must interpret these negative results with some caution.  Families with features suggestive of hereditary risk for cancer tend to have multiple family members with cancer, diagnoses in multiple generations and diagnoses before the age of 89. Ms. Grether  family exhibits some of these features. Thus this result may simply reflect our current inability to detect all mutations within these genes or there may be a different gene that has not yet been discovered or tested.   Ms. Alberts had cancer in her 30s-40s and therefore MSI and IHC testing is not able to be performed retroactively.  If she develops another cancer, it may be helpful to perform these tests on the tumor.  FOLLOW-UP: Lastly, we discussed with Ms. Carpenter that cancer genetics is a rapidly advancing field and it is possible that new genetic tests will be appropriate for her and/or her family members in the future. We encouraged her to remain in contact with cancer genetics on an annual basis so we can update her personal and family histories and let her know of advances in cancer genetics that may benefit this family.   Our contact number was provided. Ms. Rozzell questions were answered to her satisfaction, and she knows she is welcome to call us at anytime with additional questions or concerns.   [name], MS, [facility] Certified Genetic Counselor [email or web address]

## 2015-03-23 ENCOUNTER — Emergency Department
Admission: EM | Admit: 2015-03-23 | Discharge: 2015-03-26 | Disposition: A | Discharge status: Other Acute Inpatient Hospital | Payer: Medicare Other | Attending: Emergency Medicine | Admitting: Emergency Medicine

## 2015-03-23 ENCOUNTER — Telehealth: Payer: Self-pay

## 2015-03-23 ENCOUNTER — Encounter: Payer: Self-pay | Admitting: Emergency Medicine

## 2015-03-23 DIAGNOSIS — Z79899 Other long term (current) drug therapy: Secondary | ICD-10-CM | POA: Insufficient documentation

## 2015-03-23 DIAGNOSIS — I129 Hypertensive chronic kidney disease with stage 1 through stage 4 chronic kidney disease, or unspecified chronic kidney disease: Secondary | ICD-10-CM | POA: Diagnosis not present

## 2015-03-23 DIAGNOSIS — Z791 Long term (current) use of non-steroidal anti-inflammatories (NSAID): Secondary | ICD-10-CM | POA: Insufficient documentation

## 2015-03-23 DIAGNOSIS — Z880 Allergy status to penicillin: Secondary | ICD-10-CM | POA: Diagnosis not present

## 2015-03-23 DIAGNOSIS — R4182 Altered mental status, unspecified: Secondary | ICD-10-CM | POA: Insufficient documentation

## 2015-03-23 DIAGNOSIS — F3113 Bipolar disorder, current episode manic without psychotic features, severe: Secondary | ICD-10-CM

## 2015-03-23 DIAGNOSIS — N183 Chronic kidney disease, stage 3 (moderate): Secondary | ICD-10-CM | POA: Diagnosis not present

## 2015-03-23 DIAGNOSIS — Z0389 Encounter for observation for other suspected diseases and conditions ruled out: Secondary | ICD-10-CM | POA: Diagnosis not present

## 2015-03-23 LAB — CBC WITH DIFFERENTIAL/PLATELET
BASOS ABS: 0 10*3/uL (ref 0–0.1)
Basophils Relative: 1 %
Eosinophils Absolute: 0.1 10*3/uL (ref 0–0.7)
Eosinophils Relative: 1 %
HCT: 41.8 % (ref 35.0–47.0)
Hemoglobin: 13.9 g/dL (ref 12.0–16.0)
LYMPHS PCT: 32 %
Lymphs Abs: 2.7 10*3/uL (ref 1.0–3.6)
MCH: 29.5 pg (ref 26.0–34.0)
MCHC: 33.1 g/dL (ref 32.0–36.0)
MCV: 89 fL (ref 80.0–100.0)
Monocytes Absolute: 0.7 10*3/uL (ref 0.2–0.9)
Monocytes Relative: 8 %
Neutro Abs: 4.8 10*3/uL (ref 1.4–6.5)
Neutrophils Relative %: 58 %
PLATELETS: 162 10*3/uL (ref 150–440)
RBC: 4.7 MIL/uL (ref 3.80–5.20)
RDW: 13.8 % (ref 11.5–14.5)
WBC: 8.4 10*3/uL (ref 3.6–11.0)

## 2015-03-23 LAB — COMPREHENSIVE METABOLIC PANEL
ALT: 56 U/L — AB (ref 14–54)
AST: 65 U/L — AB (ref 15–41)
Albumin: 4.3 g/dL (ref 3.5–5.0)
Alkaline Phosphatase: 121 U/L (ref 38–126)
Anion gap: 6 (ref 5–15)
BUN: 36 mg/dL — AB (ref 6–20)
CHLORIDE: 108 mmol/L (ref 101–111)
CO2: 24 mmol/L (ref 22–32)
CREATININE: 1.38 mg/dL — AB (ref 0.44–1.00)
Calcium: 10 mg/dL (ref 8.9–10.3)
GFR calc Af Amer: 46 mL/min — ABNORMAL LOW (ref >60)
GFR calc non Af Amer: 39 mL/min — ABNORMAL LOW (ref >60)
GLUCOSE: 91 mg/dL (ref 65–99)
Potassium: 4.6 mmol/L (ref 3.5–5.1)
Sodium: 138 mmol/L (ref 135–145)
Total Bilirubin: 0.3 mg/dL (ref 0.3–1.2)
Total Protein: 7.7 g/dL (ref 6.5–8.1)

## 2015-03-23 LAB — URINALYSIS COMPLETE WITH MICROSCOPIC (ARMC ONLY)
BACTERIA UA: NONE SEEN
Bilirubin Urine: NEGATIVE
Glucose, UA: NEGATIVE mg/dL
Ketones, ur: NEGATIVE mg/dL
Leukocytes, UA: NEGATIVE
Nitrite: NEGATIVE
PROTEIN: NEGATIVE mg/dL
Specific Gravity, Urine: 1.013 (ref 1.005–1.030)
pH: 5 (ref 5.0–8.0)

## 2015-03-23 LAB — GLUCOSE, CAPILLARY: GLUCOSE-CAPILLARY: 85 mg/dL (ref 65–99)

## 2015-03-23 MED ORDER — QUETIAPINE FUMARATE ER 50 MG PO TB24
50.0000 mg | ORAL_TABLET | Freq: Every day | ORAL | Status: DC
  Administered 2015-03-24 (×2): 50 mg via ORAL
  Filled 2015-03-23 (×3): qty 1

## 2015-03-23 NOTE — ED Provider Notes (Signed)
[facility] Emergency Department Provider Note   ____________________________________________  Time seen: 3:30 PM I have reviewed the triage vital signs and the triage nursing note.  HISTORY  Chief Complaint Altered Mental Status   Historian Patient, limited as she is a poor historian. Other history from sister over the phone  HPI Gina [name] is a 63 y.o. female who lives at home with her sister, and has history of bipolar, her sister states she's been off medication now for about 4 days. Reportedly the patient has had some auditory hallucinations per the sister, however the patient is denying this now. She's had change in her baseline mental status in terms of increased agitation, and found walking outside wandering. Patient seems slightly anxious but she is not complaining of any of these complaints. She says "my sister knows me best ".    Past Medical History  Diagnosis Date  . Bipolar depression     sees psychiatrist  . DJD (degenerative joint disease), lumbar     chronic lower back pain  . Obesity (BMI 30-39.9)   . History of colon cancer     s/p surgery  . 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 OSA on CPAP     6cm H2O  . RBBB   . Anemia in chronic kidney disease   . Blood transfusion without reported diagnosis 2009  . Cataract     left  . GERD (gastroesophageal reflux disease)   . Hyperlipidemia   . Hypertension   . History of bladder cancer 1997  . History of uterine cancer     s/p hysterectomy  . CKD (chronic kidney disease) stage 3, GFR 30-59 ml/min   . Personal history of colonic adenomas and colon cancer 11/11/2012  . IDA (iron deficiency anemia) 01/2013    thought due to h/o polyps  . Positive hepatitis C antibody test 09/2014    but negative confirmatory testing  . Colon cancer 1990's  . Family history of breast cancer   . Family history of colon cancer   . Family history of stomach cancer      Patient Active Problem List   Diagnosis Date Noted  . Bipolar disorder 03/23/2015  . Genetic testing 03/21/2015  . Family history of breast cancer   . Family history of colon cancer   . Family history of stomach cancer   . History of anemia 11/27/2014  . Bipolar affective disorder, current episode manic 11/27/2014  . Bladder cancer 11/27/2014  . History of urinary anomaly 11/27/2014  . Uterine cancer 11/27/2014  . Allergic rhinitis 11/03/2014  . Contact dermatitis 11/03/2014  . Advanced care planning/counseling discussion 10/11/2014  . Memory deficit 09/13/2014  . Positive hepatitis C antibody test 09/04/2014  . Abnormal mental state 09/04/2014  . Amnesia 09/04/2014  . Thrombocytopenia 08/12/2014  . Prediabetes 06/07/2014  . HTN (hypertension) 06/07/2014  . Trouble in sleeping 05/10/2014  . Right knee pain 03/16/2014  . Sleep apnea 02/13/2014  . Hyperlipidemia   . IDA (iron deficiency anemia) 01/26/2013  . History of colonic polyps 11/11/2012  . Recurrent falls 10/02/2012  . Nasal congestion 10/02/2012  . Medicare annual wellness visit, subsequent 08/27/2012  . Urine incontinence 08/27/2012  . Therapeutic drug monitoring 08/16/2012  . Recurrent ventral hernia 08/11/2012  . OSA on CPAP   . Intertrigo 05/27/2012  . CKD (chronic kidney disease) stage 3, GFR 30-59 ml/min   . Obesity (BMI 30-39.9)   . Colon cancer 04/07/2012  .  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 Bipolar 1 disorder 04/07/2012    Past Surgical History  Procedure Laterality Date  . Hernia repair  02/04/12  . Partial colectomy  about 2008    for colon cancer  . I & d abdominal wound  02/19/12  . Removal of infected mesh and abdominal wound vac placement  02/24/12  . Reexploration of abdominal wound and allograft placemet  02/26/12  . Partial hysterectomy  1981    uterine cancer, R ovary remains  . Left oophorectomy  2005  . Colonoscopy  07/2009  . Dexa  12/2009    WNL  . Dobutamine stress echo   12/2009    no evidence of ischemia  . Colonoscopy  11/2012    2 tubular adenomas, mild diverticulosis, pending genetic testing for Lynch syndrome [name]) rpt 2 yrs  . Sleep study  02/2014    OSA - AHI 55, nadir 81% [name])  . Breast biopsy Right 01/2014    fibroadenoma  . Colonoscopy  02/2015    TA, diverticulosis, rpt 2 yrs [name])    Current Outpatient Rx  Name  Route  Sig  Dispense  Refill  . allopurinol (ZYLOPRIM) 100 MG tablet   Oral   Take 1 tablet (100 mg total) by mouth daily.   30 tablet   2   . Cholecalciferol (VITAMIN D3) 400 UNITS CAPS   Oral   Take 400 Units by mouth daily.         Marland Kitchen dicyclomine (BENTYL) 20 MG tablet   Oral   Take 20 mg by mouth every 6 (six) hours as needed for spasms.         Marland Kitchen FLUoxetine (PROZAC) 20 MG capsule   Oral   Take 20 mg by mouth daily.         . hydrOXYzine (ATARAX/VISTARIL) 50 MG tablet   Oral   Take 1 tablet (50 mg total) by mouth at bedtime as needed (insomnia).   30 tablet   6   . indomethacin (INDOCIN) 50 MG capsule   Oral   Take 50 mg by mouth 2 (two) times daily with a meal.         . lovastatin (MEVACOR) 40 MG tablet   Oral   Take 1 tablet (40 mg total) by mouth at bedtime.   90 tablet   3   . metoprolol tartrate (LOPRESSOR) 25 MG tablet   Oral   Take 1.5 tablets (37.5 mg total) by mouth 2 (two) times daily.   270 tablet   3   . nystatin ointment (MYCOSTATIN)   Topical   Apply 1 application topically 2 (two) times daily.   60 g   1     Cancel cream Rx   . ondansetron (ZOFRAN) 4 MG tablet   Oral   Take 4 mg by mouth every 8 (eight) hours as needed for nausea or vomiting.         Marland Kitchen QUEtiapine (SEROQUEL) 50 MG tablet      Take 1 and 1/2 tablets at bedtime for 7 days and then increase to 2 tablets at bedtime. Patient taking differently: Take 100 mg by mouth at bedtime.    60 tablet   4   . sennosides-docusate sodium (SENOKOT-S) 8.6-50 MG tablet   Oral   Take 1 tablet by mouth daily as  needed for constipation.         . simethicone (MYLICON) 829 MG chewable tablet   Oral   Chew 125  mg by mouth every 6 (six) hours as needed for flatulence.         Marland Kitchen azelastine (ASTELIN) 0.1 % nasal spray   Each Nare   Place 1 spray into both nostrils 2 (two) times daily. Use in each nostril as directed Patient not taking: Reported on 03/23/2015   30 mL   12   . fluticasone (FLONASE) 50 MCG/ACT nasal spray   Each Nare   Place 2 sprays into both nostrils daily. Patient not taking: Reported on 03/23/2015   16 g   6   . VOLTAREN 1 % GEL      APPLY TO AFFECTED AREA THREE TIMES DAILY Patient not taking: Reported on 01/31/2015   100 g   1     Allergies Penicillins; Risperdal; Ivp dye; Tetanus toxoids; and Zetia  Family History  Problem Relation Age of Onset  . Colon cancer Mother 38  . Stomach cancer Mother     dx in her 54s?  Marland Kitchen Colon cancer Sister 22    Maternal half sister  . CAD Father     MI  . Diabetes Other     aunts and uncles both sides  . Arthritis Other     strong fmhx  . Mental illness Sister     anxiety/depression  . Breast cancer Maternal Grandmother   . Esophageal cancer Neg Hx   . Rectal cancer Neg Hx     Social History Social History  Substance Use Topics  . Smoking status: Never Smoker   . Smokeless tobacco: Never Used  . Alcohol Use: No    Review of Systems  Constitutional: Negative for fever. Eyes: Negative for visual changes. ENT: Negative for sore throat. Cardiovascular: Negative for chest pain. Respiratory: Negative for shortness of breath. Gastrointestinal: Negative for abdominal pain, vomiting and diarrhea. Genitourinary: Negative for dysuria. Musculoskeletal: Negative for back pain. Skin: Negative for rash. Neurological: Negative for headaches, focal weakness or numbness. 10 point Review of Systems otherwise negative ____________________________________________   PHYSICAL EXAM:  VITAL SIGNS: ED Triage Vitals  Enc  Vitals Group     BP 03/23/15 1145 176/93 mmHg     Pulse Rate 03/23/15 1142 62     Resp 03/23/15 1142 20     Temp 03/23/15 1142 98.1 F (36.7 C)     Temp Source 03/23/15 1142 Oral     SpO2 03/23/15 1142 100 %     Weight 03/23/15 1142 196 lb (88.905 kg)     Height 03/23/15 1142 5\' 1"  (1.549 m)     Head Cir --      Peak Flow --      Pain Score 03/23/15 1150 7     Pain Loc --      Pain Edu? --      Excl. in [place]? --      Constitutional: Alert and cooperative. In no acute distress. Eyes: Conjunctivae are normal. PERRL. Normal extraocular movements. ENT   Head: Normocephalic and atraumatic.Virilized facial hair pattern   Nose: No congestion/rhinnorhea.   Mouth/Throat: Mucous membranes are moist.   Neck: No stridor. Cardiovascular/Chest: Normal rate, regular rhythm.  No murmurs, rubs, or gallops. Respiratory: Normal respiratory effort without tachypnea nor retractions. Breath sounds are clear and equal bilaterally. No wheezes/rales/rhonchi. Gastrointestinal: Soft. No distention, no guarding, no rebound. Nontender   Genitourinary/rectal:Deferred Musculoskeletal: Nontender with normal range of motion in all extremities. No joint effusions.  No lower extremity tenderness nor edema. Neurologic:  Normal speech and language. No gross or  focal neurologic deficits are appreciated. Skin:  Skin is warm, dry and intact. No rash noted. Psychiatric: Mild elevated mood, some slight anxiety. No obvious hallucinations. Denies depression. Poor insight or judgment.  ____________________________________________   EKG I, Lisa Roca, MD, the attending physician have personally viewed and interpreted all ECGs.  No EKG performed ____________________________________________  LABS (pertinent positives/negatives)  Urinalysis negative for urinary tract infection CBC within normal limits Complete metabolic panel without significant abnormality. BUN 36, creatinine 1.38 and these are  chronic  ____________________________________________  RADIOLOGY All Xrays were viewed by me. Imaging interpreted by Radiologist.  None __________________________________________  PROCEDURES  Procedure(s) performed: None Critical Care performed: None  ____________________________________________   ED COURSE / ASSESSMENT AND PLAN  CONSULTATIONS: TTS, psychiatry are pending  Pertinent labs & imaging results that were available during my care of the patient were reviewed by me and considered in my medical decision making (see chart for details).\\   The patient is a poor historian, but it sounds like she has been off her medications and slightly elevated mood as well as having auditory hallucinations although not currently having them now, she is poor historian and does not seem to be able to provide any details as to how she takes her medications or was been going on the last couple of days. After screening laboratory/medical evaluation, I did have her seen by the psychiatrist. I don't see an indication to place her under involuntary hold. She'll see the psychiatrist tomorrow.   Patient care transferred to oncoming physician Dr. Beather Arbour at shift change 12:45 PM. ___________________________________________   FINAL CLINICAL IMPRESSION(S) / ED DIAGNOSES   Final diagnoses:  Altered mental status, unspecified altered mental status type       Lisa Roca, MD 03/23/15 2348

## 2015-03-23 NOTE — ED Notes (Signed)

## 2015-03-23 NOTE — ED Notes (Signed)
BEHAVIORAL HEALTH ROUNDING Patient sleeping: No. Patient alert and oriented: yes Behavior appropriate: Yes.  ; If no, describe:  Nutrition and fluids offered: Yes  Toileting and hygiene offered: Yes  Sitter present: yes Law enforcement present: Yes  

## 2015-03-23 NOTE — ED Notes (Addendum)
Patient continues to be ao x4 denies any pain or any hallucinations. However, when the patients belongings were secured and wallet was going through, patient stated multiple I could take whatever I wanted and said "I don't need it"  Explained to patient that staff will not be taking her items and that she will receive her belongings at discharge.  Patient states she has been taking her medications as she is supposed to and has not missed any doses. Worried about medications she is going to take tonight.

## 2015-03-23 NOTE — ED Notes (Signed)
Spoke with Dr. Reita Cliche, patient is now inpatient psychiatric.  Will change patient into hospital scrubs, remove belongings and secure environment.

## 2015-03-23 NOTE — ED Notes (Signed)
Patient denies any auditory hallucinations, but states everyone is yelling too much but then says it is her sister that says she is talking too loud.

## 2015-03-23 NOTE — ED Notes (Signed)
Says she called her doctor and he told her to come here to have medicines evaluated.  Says she lives with her sisiter, but her sister had to leave to give their dog some medicine.  Pt is coopertive.  Denies wanting to hurt self or others.

## 2015-03-23 NOTE — ED Notes (Signed)
Sister Lelan Pons notified patient would be staying the night and will be evaluated by psychiatrist.

## 2015-03-23 NOTE — Consult Note (Addendum)
[facility] Face-to-Face Psychiatry Consult   Reason for Consult: History of bipolar disorder. Has refused medications for 4 days with +AH. Was found outside wandering and has been more agitated.  Referring Physician:  Lisa Roca, M.D.  Patient Identification: Gina [name] MRN:  [id number] Principal Diagnosis: Bipolar disorder   Diagnosis:   Patient Active Problem List   Diagnosis Date Noted  . Genetic testing [Z31.5] 03/21/2015  . Family history of breast cancer [Z80.3]   . Family history of colon cancer [Z80.0]   . Family history of stomach cancer [Z80.0]   . History of anemia [Z86.2] 11/27/2014  . Bipolar affective disorder, current episode manic [F31.9] 11/27/2014  . Bladder cancer [C67.9] 11/27/2014  . History of urinary anomaly [Z87.448] 11/27/2014  . Uterine cancer [C55] 11/27/2014  . Allergic rhinitis [J30.9] 11/03/2014  . Contact dermatitis [L25.9] 11/03/2014  . Advanced care planning/counseling discussion [Z71.89] 10/11/2014  . Memory deficit [R41.3] 09/13/2014  . Positive hepatitis C antibody test [R89.4] 09/04/2014  . Abnormal mental state [R41.82] 09/04/2014  . Amnesia [R41.3] 09/04/2014  . Thrombocytopenia [D69.6] 08/12/2014  . Prediabetes [R73.09] 06/07/2014  . HTN (hypertension) [I10] 06/07/2014  . Trouble in sleeping [G47.9] 05/10/2014  . Right knee pain [M25.561] 03/16/2014  . Sleep apnea [G47.30] 02/13/2014  . Hyperlipidemia [E78.5]   . IDA (iron deficiency anemia) [D50.9] 01/26/2013  . History of colonic polyps [Z86.010] 11/11/2012  . Recurrent falls [R29.6] 10/02/2012  . Nasal congestion [R09.81] 10/02/2012  . Medicare annual wellness visit, subsequent [Z00.00] 08/27/2012  . Urine incontinence [R32] 08/27/2012  . Therapeutic drug monitoring [Z51.81] 08/16/2012  . Recurrent ventral hernia [K43.2] 08/11/2012  . OSA on CPAP [G47.33]   . Intertrigo [L30.4] 05/27/2012  . CKD (chronic kidney disease) stage 3, GFR 30-59 ml/min [N18.3]   . Obesity (BMI  30-39.9) [E66.9]   . Colon cancer [C18.9] 04/07/2012  . Enterocutaneous fistula [K63.2] 04/07/2012  . Bipolar 1 disorder [F31.9] 04/07/2012    Total Time spent with patient: 30 minutes  Subjective:   Gina [name] is a 64 y.o. female patient admitted with a h/o bipolar disorder. The pt spoke and her sister spoke with Dr. Reita Cliche. The pt's sister shared the pt has been off of her psychotropic medications for about 4 months. She was found walking outside and has been more agitated recently.  The pt's sister also shared the pt experienced auditory hallucinations but those, per the pt, have resolved.   On interview, Gina [name] explained that I should call her whatever I would like to call her. She mentioned the name Gina [name] and stated I could call her that if I would like. The pt is resting comfortably in bed, holding a styrofoam cup in her right hand and transfers it back and forth between her hands several times during the interview. She has a short haircut and wears glasses.  She fidgets frequently during the interview & when asked what brought her to the hospital this evening, the pt stated, "I won't go too far back" and began talking about her childhood. The pt discussed a history of thorazine use and frequent medication changes. She shared she saw "[place]" which is her name for Gina [name] when she was child. She would not answer as to whether she saw Gina [name] recently. She is hyperreligious and speaks about reading the bible and people needing to be read the bible more.    The pt believes she last took her psychotropic medications last night but she does not recall the names of her  medications. Pt shared she is wide awake and she her sleep has been variable but decreased. During the interview, the pt is easily distracted with flight of ideas, increased activity, and talkative. Evidence of irritability and grandiosity were absent.   Currently the pt denies SI, HI and AVH. She denies there are  weapons at home.   HPI Elements:   Location:  see HPI. Quality:  mild to moderate. Severity:  worsening. Timing:  past 4 days. Duration:  4 days. Context:  discontinuing psychotropic medications.  Past Psychiatric History: Psychiatrist: Dr. Jimmye Norman at [facility] Therapist: None Hospitalizations: Several including [facility] in the past ECT: "I can't remember. It comes and goes."  Suicide attempt/Self-harm: "I don't remember"  Homicide attempts/harming others: "Everybody is going to say they need organ donors."   Past Medical History:  Past Medical History  Diagnosis Date  . Bipolar depression     sees psychiatrist  . DJD (degenerative joint disease), lumbar     chronic lower back pain  . Obesity (BMI 30-39.9)   . History of colon cancer     s/p surgery  . 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 OSA on CPAP     6cm H2O  . RBBB   . Anemia in chronic kidney disease   . Blood transfusion without reported diagnosis 2009  . Cataract     left  . GERD (gastroesophageal reflux disease)   . Hyperlipidemia   . Hypertension   . History of bladder cancer 1997  . History of uterine cancer     s/p hysterectomy  . CKD (chronic kidney disease) stage 3, GFR 30-59 ml/min   . Personal history of colonic adenomas and colon cancer 11/11/2012  . IDA (iron deficiency anemia) 01/2013    thought due to h/o polyps  . Positive hepatitis C antibody test 09/2014    but negative confirmatory testing  . Colon cancer 1990's  . Family history of breast cancer   . Family history of colon cancer   . Family history of stomach cancer     Past Surgical History  Procedure Laterality Date  . Hernia repair  02/04/12  . Partial colectomy  about 2008    for colon cancer  . I & d abdominal wound  02/19/12  . Removal of infected mesh and abdominal wound vac placement  02/24/12  . Reexploration of abdominal wound and allograft placemet  02/26/12   . Partial hysterectomy  1981    uterine cancer, R ovary remains  . Left oophorectomy  2005  . Colonoscopy  07/2009  . Dexa  12/2009    WNL  . Dobutamine stress echo  12/2009    no evidence of ischemia  . Colonoscopy  11/2012    2 tubular adenomas, mild diverticulosis, pending genetic testing for [name] syndrome [name]) rpt 2 yrs  . Sleep study  02/2014    OSA - AHI 55, nadir 81% [name])  . Breast biopsy Right 01/2014    fibroadenoma  . Colonoscopy  02/2015    TA, diverticulosis, rpt 2 yrs [name])   Family History:  Family History  Problem Relation Age of Onset  . Colon cancer Mother 21  . Stomach cancer Mother     dx in her 70s?  Marland Kitchen Colon cancer Sister 38    Maternal half sister  . CAD Father     MI  . Diabetes Other     aunts and uncles both sides  .  Arthritis Other     strong fmhx  . Mental illness Sister     anxiety/depression  . Breast cancer Maternal Grandmother   . Esophageal cancer Neg Hx   . Rectal cancer Neg Hx    Social History:  History  Alcohol Use No     History  Drug Use No    Social History   Social History  . Marital Status: Single    Spouse Name: N/A  . Number of Children: 0  . Years of Education: N/A   Social History Main Topics  . Smoking status: Never Smoker   . Smokeless tobacco: Never Used  . Alcohol Use: No  . Drug Use: No  . Sexual Activity: No   Other Topics Concern  . None   Social History Narrative   Lives with sister, no pets   Occupation: disabled, for bipolar and arthritis   Edu: GED   Activity: take walks   Diet: good water, vegetables daily      Psych - [place], Dr. Jimmye Norman (ph [phone])   Additional Social History:    History of alcohol / drug use?: No history of alcohol / drug abuse (Pt denies alcohol use)  Allergies:   Allergies  Allergen Reactions  . Penicillins Other (See Comments)    Pt states that it knocks her out for awhile.    Marland Kitchen Risperdal [Risperidone] Other (See Comments)     Reaction:  Memory loss/AMS evaluated by Dr Melrose Nakay[facility] at [place]  . Ivp Dye [Iodinated Diagnostic Agents] Rash  . Tetanus Toxoids Rash       . Zetia [Ezetimibe] Rash    Labs:  Results for orders placed or performed during the hospital encounter of 03/23/15 (from the past 48 hour(s))  Glucose, capillary     Status: None   Collection Time: 03/23/15  7:14 PM  Result Value Ref Range   Glucose-Capillary 85 65 - 99 mg/dL  CBC with Differential     Status: None   Collection Time: 03/23/15  7:42 PM  Result Value Ref Range   WBC 8.4 3.6 - 11.0 K/uL   RBC 4.70 3.80 - 5.20 MIL/uL   Hemoglobin 13.9 12.0 - 16.0 g/dL   HCT 41.8 35.0 - 47.0 %   MCV 89.0 80.0 - 100.0 fL   MCH 29.5 26.0 - 34.0 pg   MCHC 33.1 32.0 - 36.0 g/dL   RDW 13.8 11.5 - 14.5 %   Platelets 162 150 - 440 K/uL   Neutrophils Relative % 58 %   Neutro Abs 4.8 1.4 - 6.5 K/uL   Lymphocytes Relative 32 %   Lymphs Abs 2.7 1.0 - 3.6 K/uL   Monocytes Relative 8 %   Monocytes Absolute 0.7 0.2 - 0.9 K/uL   Eosinophils Relative 1 %   Eosinophils Absolute 0.1 0 - 0.7 K/uL   Basophils Relative 1 %   Basophils Absolute 0.0 0 - 0.1 K/uL  Comprehensive metabolic panel     Status: Abnormal   Collection Time: 03/23/15  7:42 PM  Result Value Ref Range   Sodium 138 135 - 145 mmol/L   Potassium 4.6 3.5 - 5.1 mmol/L   Chloride 108 101 - 111 mmol/L   CO2 24 22 - 32 mmol/L   Glucose, Bld 91 65 - 99 mg/dL   BUN 36 (H) 6 - 20 mg/dL   Creatinine, Ser 1.38 (H) 0.44 - 1.00 mg/dL   Calcium 10.0 8.9 - 10.3 mg/dL   Total Protein  7.7 6.5 - 8.1 g/dL   Albumin 4.3 3.5 - 5.0 g/dL   AST 65 (H) 15 - 41 U/L   ALT 56 (H) 14 - 54 U/L   Alkaline Phosphatase 121 38 - 126 U/L   Total Bilirubin 0.3 0.3 - 1.2 mg/dL   GFR calc non Af Amer 39 (L) >60 mL/min   GFR calc Af Amer 46 (L) >60 mL/min    Comment: (NOTE) The eGFR has been calculated using the CKD EPI equation. This calculation has not been validated in all clinical situations. eGFR's  persistently <60 mL/min signify possible Chronic Kidney Disease.    Anion gap 6 5 - 15    Vitals: Blood pressure 159/82, pulse 59, temperature 98.4 F (36.9 C), temperature source Oral, resp. rate 18, height 5' 1"  (1.549 m), weight 88.905 kg (196 lb), SpO2 99 %.  Risk to Self: Suicidal Ideation: No Suicidal Intent: No Is patient at risk for suicide?: No Suicidal Plan?: No Access to Means: No What has been your use of drugs/alcohol within the last 12 months?: None reported How many times?: 0 Other Self Harm Risks: None reported Triggers for Past Attempts: None known Intentional Self Injurious Behavior: None Risk to Others: Homicidal Ideation: No Thoughts of Harm to Others: No Current Homicidal Intent: No Current Homicidal Plan: No Access to Homicidal Means: No Identified Victim: N/A History of harm to others?: No Assessment of Violence: None Noted Violent Behavior Description: None reported Does patient have access to weapons?: No Criminal Charges Pending?: No Does patient have a court date: No Prior Inpatient Therapy: Prior Inpatient Therapy: No Prior Outpatient Therapy: Prior Outpatient Therapy: Yes Prior Therapy Facilty/Provider(s): [facility] Does patient have an ACCT team?: No Does patient have Intensive In-House Services?  : No Does patient have [facility] services? : No Does patient have [facility] services?: No  No current facility-administered medications for this encounter.   Current Outpatient Prescriptions  Medication Sig Dispense Refill  . allopurinol (ZYLOPRIM) 100 MG tablet Take 1 tablet (100 mg total) by mouth daily. 30 tablet 2  . cholecalciferol (VITAMIN D) 400 UNITS TABS Take 400 Units by mouth daily.    Marland Kitchen FLUoxetine (PROZAC) 20 MG capsule Take 20 mg by mouth daily.    . hydrOXYzine (ATARAX/VISTARIL) 50 MG tablet Take 1 tablet (50 mg total) by mouth at bedtime as needed (insomnia). 30 tablet 6  . lovastatin (MEVACOR) 40 MG tablet Take 1 tablet (40 mg total) by mouth  at bedtime. 90 tablet 3  . metoprolol tartrate (LOPRESSOR) 25 MG tablet Take 1.5 tablets (37.5 mg total) by mouth 2 (two) times daily. 270 tablet 3  . nystatin ointment (MYCOSTATIN) Apply 1 application topically 2 (two) times daily. 60 g 1  . QUEtiapine (SEROQUEL) 50 MG tablet Take 1 and 1/2 tablets at bedtime for 7 days and then increase to 2 tablets at bedtime. (Patient taking differently: Take 100 mg by mouth at bedtime. ) 60 tablet 4  . azelastine (ASTELIN) 0.1 % nasal spray Place 1 spray into both nostrils 2 (two) times daily. Use in each nostril as directed (Patient not taking: Reported on 03/23/2015) 30 mL 12  . fluticasone (FLONASE) 50 MCG/ACT nasal spray Place 2 sprays into both nostrils daily. (Patient not taking: Reported on 03/23/2015) 16 g 6  . VOLTAREN 1 % GEL APPLY TO AFFECTED AREA THREE TIMES DAILY (Patient not taking: Reported on 01/31/2015) 100 g 1    Musculoskeletal: Strength & Muscle Tone: within normal limits Gait & Station: not examined  Patient leans: N/A  Psychiatric Specialty Exam: Physical Exam  Review of Systems  Constitutional: Negative.   HENT: Negative.   Eyes: Negative.   Respiratory: Negative.   Cardiovascular: Negative.   Gastrointestinal: Negative.   Genitourinary: Negative.   Musculoskeletal: Negative.   Skin: Negative.   Neurological: Negative.   Endo/Heme/Allergies: Negative.   Psychiatric/Behavioral: Positive for depression. Negative for suicidal ideas, hallucinations, memory loss and substance abuse. The patient has insomnia. The patient is not nervous/anxious.     Blood pressure 159/82, pulse 59, temperature 98.4 F (36.9 C), temperature source Oral, resp. rate 18, height 5' 1"  (1.549 m), weight 88.905 kg (196 lb), SpO2 99 %.Body mass index is 37.05 kg/(m^2).  General Appearance: Casual and Fairly Groomed  Engineer, water::  Good  Speech:  Rapid  Volume:  Normal  Mood:  Elevated  Affect:  Mood congruent  Thought Process:  Circumstantial,  Disorganized and Tangential  Orientation:  Full (Time, Place, and Person)  Thought Content:  See HPI  Suicidal Thoughts:  No  Homicidal Thoughts:  No  Memory:  Immediate;   Fair Recent;   Fair Remote;   Fair  Judgement:  Impaired  Insight:  Lacking  Psychomotor Activity:  Restlessness  Concentration:  Fair  Recall:  [employer] of Knowledge:Fair  Language: Good  Akathisia:  Negative  Handed:  Right  AIMS (if indicated):     Assets:  Wellsite geologist  ADL's:  Intact  Cognition: WNL  Sleep:  variable but decreased overall   Medical Decision Making: Review of Psycho-Social Stressors (1), Review or order clinical lab tests (1), Established Problem, Worsening (2), Review or order medicine tests (1), Review of Medication Regimen & Side Effects (2) and Review of New Medication or Change in Dosage (2)  Treatment Plan Summary: Medication management and Plan IVC to seek appropriate placement.  Start Seroquel XR 62m po QHS.   Plan:  Recommend psychiatric Inpatient admission when medically cleared. Disposition: Inpatient psychiatric hospitalization  MDonita Brooks8/19/2016 9:35 PM

## 2015-03-23 NOTE — ED Notes (Signed)
Patient states she just started feeling diaphoretic. Denies any pain.

## 2015-03-23 NOTE — ED Notes (Signed)
Patient dressed in hospital scrubs in front of [facility].  All belongings secured and stored in locker. Patient has $28 in wallet that was counted and verified in front of patient.

## 2015-03-23 NOTE — BH Assessment (Signed)
Assessment Note  Gina [name] is an 64 y.o. female  Presenting to the ED voluntarily, upon the recommendation of Dr. Jimmye Norman, [place].  Patient has history of bipolar disorder and states she has had trouble with insomnia and not been able to sleep. Patient is talking about her medications and was previously on Depakote and was weaned off. Patient recently started on Seroquel but states some of her medication is missing. Pt denies any SI/HI reports no drug/alcohol use.  Axis I: Bipolar, Depressed Axis II: Deferred Axis III:  Past Medical History  Diagnosis Date  . Bipolar depression     sees psychiatrist  . DJD (degenerative joint disease), lumbar     chronic lower back pain  . Obesity (BMI 30-39.9)   . History of colon cancer     s/p surgery  . 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 OSA on CPAP     6cm H2O  . RBBB   . Anemia in chronic kidney disease   . Blood transfusion without reported diagnosis 2009  . Cataract     left  . GERD (gastroesophageal reflux disease)   . Hyperlipidemia   . Hypertension   . History of bladder cancer 1997  . History of uterine cancer     s/p hysterectomy  . CKD (chronic kidney disease) stage 3, GFR 30-59 ml/min   . Personal history of colonic adenomas and colon cancer 11/11/2012  . IDA (iron deficiency anemia) 01/2013    thought due to h/o polyps  . Positive hepatitis C antibody test 09/2014    but negative confirmatory testing  . Colon cancer 1990's  . Family history of breast cancer   . Family history of colon cancer   . Family history of stomach cancer    Axis IV: problems with primary support group Axis V: 61-70 mild symptoms  Past Medical History:  Past Medical History  Diagnosis Date  . Bipolar depression     sees psychiatrist  . DJD (degenerative joint disease), lumbar     chronic lower back pain  . Obesity (BMI 30-39.9)   . History of colon cancer     s/p  surgery  . 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 OSA on CPAP     6cm H2O  . RBBB   . Anemia in chronic kidney disease   . Blood transfusion without reported diagnosis 2009  . Cataract     left  . GERD (gastroesophageal reflux disease)   . Hyperlipidemia   . Hypertension   . History of bladder cancer 1997  . History of uterine cancer     s/p hysterectomy  . CKD (chronic kidney disease) stage 3, GFR 30-59 ml/min   . Personal history of colonic adenomas and colon cancer 11/11/2012  . IDA (iron deficiency anemia) 01/2013    thought due to h/o polyps  . Positive hepatitis C antibody test 09/2014    but negative confirmatory testing  . Colon cancer 1990's  . Family history of breast cancer   . Family history of colon cancer   . Family history of stomach cancer     Past Surgical History  Procedure Laterality Date  . Hernia repair  02/04/12  . Partial colectomy  about 2008    for colon cancer  . I & d abdominal wound  02/19/12  . Removal of infected mesh and abdominal wound vac placement  02/24/12  .  Reexploration of abdominal wound and allograft placemet  02/26/12  . Partial hysterectomy  1981    uterine cancer, R ovary remains  . Left oophorectomy  2005  . Colonoscopy  07/2009  . Dexa  12/2009    WNL  . Dobutamine stress echo  12/2009    no evidence of ischemia  . Colonoscopy  11/2012    2 tubular adenomas, mild diverticulosis, pending genetic testing for Lynch syndrome [name]) rpt 2 yrs  . Sleep study  02/2014    OSA - AHI 55, nadir 81% [name])  . Breast biopsy Right 01/2014    fibroadenoma  . Colonoscopy  02/2015    TA, diverticulosis, rpt 2 yrs [name])    Family History:  Family History  Problem Relation Age of Onset  . Colon cancer Mother 31  . Stomach cancer Mother     dx in her 44s?  Marland Kitchen Colon cancer Sister 30    Maternal half sister  . CAD Father     MI  . Diabetes Other     aunts and uncles both sides  . Arthritis Other      strong fmhx  . Mental illness Sister     anxiety/depression  . Breast cancer Maternal Grandmother   . Esophageal cancer Neg Hx   . Rectal cancer Neg Hx     Social History:  reports that she has never smoked. She has never used smokeless tobacco. She reports that she does not drink alcohol or use illicit drugs.  Additional Social History:  Alcohol / Drug Use History of alcohol / drug use?: No history of alcohol / drug abuse (Pt denies alcohol use)  CIWA: CIWA-Ar BP: (!) 164/107 mmHg Pulse Rate: (!) 56 COWS:    Allergies:  Allergies  Allergen Reactions  . Penicillins Other (See Comments)    Pt states that it knocks her out for awhile.    Marland Kitchen Risperdal [Risperidone] Other (See Comments)    Reaction:  Memory loss/AMS evaluated by Dr Melrose Nakayama [facility] at [facility]  . Ivp Dye [Iodinated Diagnostic Agents] Rash  . Tetanus Toxoids Rash       . Zetia [Ezetimibe] Rash    Home Medications:  (Not in a hospital admission)  OB/GYN Status:  No LMP recorded. Patient has had a hysterectomy.  General Assessment Data Location of Assessment: [facility] ED TTS Assessment: In system Is this a Tele or Face-to-Face Assessment?: Face-to-Face Is this an Initial Assessment or a Re-assessment for this encounter?: Initial Assessment Marital status: Single Maiden name: Mcgann Is patient pregnant?: No Pregnancy Status: No Living Arrangements: Alone Can pt return to current living arrangement?: No Admission Status: Voluntary Is patient capable of signing voluntary admission?: Yes Referral Source: Self/Family/Friend  Medical Screening Exam ([place]) Medical Exam completed: Yes  Crisis Care Plan Living Arrangements: Alone Name of Psychiatrist: Dr. Jimmye Norman Name of Therapist: [facility]  Education Status Is patient currently in school?: No Current Grade: N/A Highest grade of school patient has completed: 12th Name of school: N/A Contact person: N/A  Risk to self with the past 6  months Suicidal Ideation: No Has patient been a risk to self within the past 6 months prior to admission? : No Suicidal Intent: No Has patient had any suicidal intent within the past 6 months prior to admission? : No Is patient at risk for suicide?: No Suicidal Plan?: No Has patient had any suicidal plan within the past 6 months prior to admission? : No Access to Means: No What has  been your use of drugs/alcohol within the last 12 months?: None reported Previous Attempts/Gestures: No How many times?: 0 Other Self Harm Risks: None reported Triggers for Past Attempts: None known Intentional Self Injurious Behavior: None Family Suicide History: No Recent stressful life event(s): Other (Comment) (None reported) Persecutory voices/beliefs?: No Depression: No Substance abuse history and/or treatment for substance abuse?: No Suicide prevention information given to non-admitted patients: Not applicable  Risk to Others within the past 6 months Homicidal Ideation: No Does patient have any lifetime risk of violence toward others beyond the six months prior to admission? : No Thoughts of Harm to Others: No Current Homicidal Intent: No Current Homicidal Plan: No Access to Homicidal Means: No Identified Victim: N/A History of harm to others?: No Assessment of Violence: None Noted Violent Behavior Description: None reported Does patient have access to weapons?: No Criminal Charges Pending?: No Does patient have a court date: No Is patient on probation?: No  Psychosis Hallucinations: None noted Delusions: None noted  Mental Status Report Appearance/Hygiene: In scrubs Eye Contact: Good Motor Activity: Restlessness, Freedom of movement Speech: Rapid Level of Consciousness: Alert Mood: Pleasant Affect: Appropriate to circumstance, Anxious Anxiety Level: Minimal Thought Processes: Relevant Judgement: Partial Orientation: Person, Place, Time, Situation Obsessive Compulsive  Thoughts/Behaviors: None  Cognitive Functioning Concentration: Normal Memory: Recent Intact IQ: Average Insight: Fair Impulse Control: Fair Appetite: Good Weight Loss: 0 Weight Gain: 15 Sleep: No Change Total Hours of Sleep: 8 Vegetative Symptoms: None  ADLScreening [facility] Assessment Services) Patient's cognitive ability adequate to safely complete daily activities?: Yes Patient able to express need for assistance with ADLs?: Yes Independently performs ADLs?: Yes (appropriate for developmental age)  Prior Inpatient Therapy Prior Inpatient Therapy: No  Prior Outpatient Therapy Prior Outpatient Therapy: Yes Prior Therapy Facilty/Provider(s): [facility] Does patient have an ACCT team?: No Does patient have Intensive In-House Services?  : No Does patient have [facility] services? : No Does patient have P4CC services?: No  ADL Screening (condition at time of admission) Patient's cognitive ability adequate to safely complete daily activities?: Yes Patient able to express need for assistance with ADLs?: Yes Independently performs ADLs?: Yes (appropriate for developmental age)       Abuse/Neglect Assessment (Assessment to be complete while patient is alone) Physical Abuse: Denies Verbal Abuse: Denies Sexual Abuse: Denies Exploitation of patient/patient's resources: Denies Self-Neglect: Denies Values / Beliefs Cultural Requests During Hospitalization: None Spiritual Requests During Hospitalization: None Consults Spiritual Care Consult Needed: No Social Work Consult Needed: No Regulatory affairs officer (For Healthcare) Does patient have an advance directive?: No Would patient like information on creating an advanced directive?: Yes Higher education careers adviser given    Additional Information 1:1 In Past 12 Months?: No CIRT Risk: No Elopement Risk: No     Disposition:  Disposition Initial Assessment Completed for this Encounter: Yes Disposition of Patient: Other dispositions Other  disposition(s): Other (Comment) (Psych MD consult)  On Site Evaluation by:   Reviewed with Physician:    Oneita Hurt 03/23/2015 8:48 PM

## 2015-03-23 NOTE — Telephone Encounter (Signed)
spoke with dr. Jimmye Norman and per his order have patient go to er

## 2015-03-23 NOTE — Telephone Encounter (Signed)
pt sister called states that patient was hearing voice, not sleeping, anxiety, ? if she taking her medications.

## 2015-03-23 NOTE — ED Notes (Signed)
Instructed patient that urine sample is needed. Unable to go at this time due to recently urinating.

## 2015-03-23 NOTE — ED Notes (Signed)
Patient has history of bipolar disorder and states she has had trouble with insomnia and not been able to sleep. Uses a CPAP machine. Patient is talking about her medications and was previously on Depakote and was weaned off.  Patient recently started on Seroquel but states some of her medication is missing. Patient is asking if she looks nice and if I like her dress.

## 2015-03-23 NOTE — Telephone Encounter (Signed)
spoke with pt sister, per dr. Jimmye Norman advice please go to the er and take the pt medication with you

## 2015-03-23 NOTE — BHH Counselor (Signed)
Per Dr. Tami Ribas, patient meets criteria for inpatient admission.  There are no beds available at [facility].  Referral packet faxed to [place], [facility], [facility], [place] and [place] was IVCd.

## 2015-03-24 ENCOUNTER — Encounter: Payer: Self-pay | Admitting: Family Medicine

## 2015-03-24 NOTE — ED Notes (Signed)
Supper and an extra drink provided  Pt observed rocking back and forth on the bed    Appropriate to stimulation  No verbalized needs or concerns at this time  NAD assessed  Continue to monitor

## 2015-03-24 NOTE — ED Notes (Signed)
BEHAVIORAL HEALTH ROUNDING  Patient sleeping: Yes.  Patient alert and oriented: no  Behavior appropriate: Yes. ; If no, describe:  Nutrition and fluids offered: No  Toileting and hygiene offered: No  Sitter present: no  Law enforcement present: Yes   

## 2015-03-24 NOTE — ED Notes (Signed)
BEHAVIORAL HEALTH ROUNDING Patient sleeping: Yes.   Patient alert and oriented: not applicable Behavior appropriate: Yes.  ; If no, describe:  Nutrition and fluids offered: No Toileting and hygiene offered: No Sitter present: not applicable Law enforcement present: Yes  

## 2015-03-24 NOTE — ED Notes (Signed)
BEHAVIORAL HEALTH ROUNDING Patient sleeping: No. Patient alert and oriented: yes Behavior appropriate: Yes.  ; If no, describe:  Nutrition and fluids offered: Yes  Toileting and hygiene offered: Yes  Sitter present: no Law enforcement present: Yes  

## 2015-03-24 NOTE — ED Notes (Signed)
BEHAVIORAL HEALTH ROUNDING Patient sleeping: Yes.   Patient alert and oriented: yes Behavior appropriate: Yes.  ; If no, describe:  Nutrition and fluids offered: No Toileting and hygiene offered: No Sitter present: not applicable Law enforcement present: Yes

## 2015-03-24 NOTE — ED Notes (Signed)
She is currently in the shower

## 2015-03-24 NOTE — ED Notes (Signed)
Pt sleeping.  Breathing even and unlabored.  NAD.

## 2015-03-24 NOTE — ED Notes (Signed)
She is sitting in a recliner in the dayroom watching Tv and visiting with another pt   Pt observed with no unusual behavior  Appropriate to stimulation  No verbalized needs or concerns at this time  NAD assessed   She will be admitted to [facility]U when staff/bed become available Continue to monitor

## 2015-03-24 NOTE — ED Notes (Signed)
Pt sleeping.  Breathing even and unlabored.  NAD.  Pt's glasses on her forehead.

## 2015-03-24 NOTE — BHH Counselor (Signed)
Pt denied by [facility] for inpatient treatment per Kennyth Lose.

## 2015-03-24 NOTE — ED Notes (Signed)
Patient is resting comfortably.  Breathing even and unlabored.  NAD.

## 2015-03-24 NOTE — ED Notes (Signed)
Breakfast served.  Pt OOB to bathroom.

## 2015-03-24 NOTE — ED Notes (Addendum)
BEHAVIORAL HEALTH ROUNDING Patient sleeping: No. Patient alert and oriented: yes Behavior appropriate: Yes.  ; If no, describe:  Nutrition and fluids offered: yes Toileting and hygiene offered: Yes  Sitter present: q15 minute observations and security camera monitoring Law enforcement present: Yes  [facility]  

## 2015-03-24 NOTE — ED Notes (Signed)
Pt receives phone call from sister.  Conversation appears to be lively and pt is laughing loudly and often during conversation.

## 2015-03-24 NOTE — ED Notes (Signed)

## 2015-03-24 NOTE — ED Notes (Signed)
BEHAVIORAL HEALTH ROUNDING Patient sleeping: No. Patient alert and oriented: yes Behavior appropriate: Yes.  ; If no, describe:  Nutrition and fluids offered: Yes  Toileting and hygiene offered: Yes  Sitter present: not applicable Law enforcement present: Yes  

## 2015-03-24 NOTE — ED Notes (Signed)
Helped pt. To bathroom.

## 2015-03-24 NOTE — ED Notes (Signed)
Pt to transfer to [place] [facility].  Report given to [name], RN.  Waiting for PO escort for IVC'd pt.

## 2015-03-24 NOTE — ED Notes (Signed)
Lunch served

## 2015-03-24 NOTE — ED Notes (Signed)
BEHAVIORAL HEALTH ROUNDING Patient sleeping: No. Patient alert and oriented: yes Behavior appropriate: Yes.   Nutrition and fluids offered: Yes  Toileting and hygiene offered: Yes  Sitter present: q15 min observations and security camera monitoring Law enforcement present: Yes [employer]  ENVIRONMENTAL ASSESSMENT Potentially harmful objects out of patient reach: Yes.   Personal belongings secured: Yes.   Patient dressed in hospital provided attire only: Yes.   Plastic bags out of patient reach: Yes.   Patient care equipment removed: Yes.   Equipment and supplies removed: Yes.   Potentially toxic materials out of patient reach: Yes.   Sharps container removed or out of patient reach: Yes.    ED BHU [place] Is the patient under IVC or is there intent for IVC: No. Is the patient medically cleared: Yes.   Is there vacancy in the ED BHU: Yes.   Is the population mix appropriate for patient: Yes. Is the patient awaiting placement in inpatient or outpatient setting: Yes Has the patient had a psychiatric consult: Yes.   Survey of unit performed for contraband, proper placement and condition of furniture, tampering with fixtures in bathroom, shower, and each patient room: Yes.  ; Findings: none APPEARANCE/BEHAVIOR Calm,cooperative NEURO ASSESSMENT Orientation: A&O x4 Hallucinations: None noted at this time Speech: Normal Gait: normal RESPIRATORY ASSESSMENT No respiratory distress noted CARDIOVASCULAR ASSESSMENT Skin color appropriate for age and race GASTROINTESTINAL ASSESSMENT no GI distress noted EXTREMITIES Moves all extremities PLAN OF CARE Provide calm/safe environment. Vital signs assessed twice daily. ED BHU Assessment once each 12-hour shift. Collaborate with intake RN daily or as condition indicates. Assure the ED provider has rounded once each shift. Provide and encourage hygiene. Provide redirection as needed. Assess for escalating behavior; address  immediately and inform ED provider.  Assess family dynamic and appropriateness for visitation as needed: Yes.   Educate the patient/family about BHU procedures/visitation: Yes.

## 2015-03-24 NOTE — ED Notes (Signed)
Pt transferred into ED BHU room 1   Patient assigned to appropriate care area. Patient oriented to unit/care area: Informed that, for their safety, care areas are designed for safety and monitored by security cameras at all times; Visiting hours and phone times explained to patient. Patient verbalizes understanding, and verbal contract for safety obtained.

## 2015-03-24 NOTE — ED Notes (Signed)
Dinner served

## 2015-03-24 NOTE — ED Notes (Signed)
Report received from RN [name] T.

## 2015-03-24 NOTE — ED Notes (Signed)
ED BHU [place] Is the patient under IVC or is there intent for IVC: Yes.   Is the patient medically cleared: Yes.   Is there vacancy in the ED BHU: Yes.   Is the population mix appropriate for patient: Yes.   Is the patient awaiting placement in inpatient or outpatient setting: Yes.  [facility] BMU when staff/bed become available  Has the patient had a psychiatric consult: Yes.   Survey of unit performed for contraband, proper placement and condition of furniture, tampering with fixtures in bathroom, shower, and each patient room: Yes.  ; Findings:  APPEARANCE/BEHAVIOR Calm and cooperative NEURO ASSESSMENT Orientation: oriented x3  Denies pain Hallucinations: No.None noted (Hallucinations) Speech: Normal Gait: normal RESPIRATORY ASSESSMENT Even  Unlabored respirations  CARDIOVASCULAR ASSESSMENT Pulses equal   regular rate  Skin warm and dry   GASTROINTESTINAL ASSESSMENT no GI complaint EXTREMITIES Full ROM  PLAN OF CARE Provide calm/safe environment. Vital signs assessed twice daily. ED BHU Assessment once each 12-hour shift. Collaborate with intake RN daily or as condition indicates. Assure the ED provider has rounded once each shift. Provide and encourage hygiene. Provide redirection as needed. Assess for escalating behavior; address immediately and inform ED provider.  Assess family dynamic and appropriateness for visitation as needed: Yes.  ; If necessary, describe findings:  Educate the patient/family about BHU procedures/visitation: Yes.  ; If necessary, describe findings:

## 2015-03-24 NOTE — BHH Counselor (Signed)
Pt was faxed to [place] for inpatient Tx bed.

## 2015-03-24 NOTE — ED Notes (Signed)
BEHAVIORAL HEALTH ROUNDING Patient sleeping: No. Patient alert and oriented: yes Behavior appropriate: Yes.   Nutrition and fluids offered: Yes  Toileting and hygiene offered: Yes  Sitter present: q15 min observations and security camera monitoring Law enforcement present: Yes [employer] 

## 2015-03-24 NOTE — ED Provider Notes (Signed)
-----------------------------------------   6:39 AM on 03/24/2015 -----------------------------------------   Blood pressure 159/82, pulse 59, temperature 98.4 F (36.9 C), temperature source Oral, resp. rate 18, height 5\' 1"  (1.549 m), weight 196 lb (88.905 kg), SpO2 99 %.  The patient had no acute events since last update.  Calm and cooperative at this time.  Disposition is pending per Psychiatry/Behavioral Medicine team recommendations.     Paulette Blanch, MD 03/24/15 [phone]

## 2015-03-25 ENCOUNTER — Emergency Department: Payer: Medicare Other

## 2015-03-25 ENCOUNTER — Other Ambulatory Visit: Payer: Self-pay

## 2015-03-25 DIAGNOSIS — Z0389 Encounter for observation for other suspected diseases and conditions ruled out: Secondary | ICD-10-CM | POA: Diagnosis not present

## 2015-03-25 MED ORDER — QUETIAPINE FUMARATE ER 50 MG PO TB24
100.0000 mg | ORAL_TABLET | Freq: Every day | ORAL | Status: DC
  Administered 2015-03-25: 100 mg via ORAL
  Filled 2015-03-25 (×2): qty 2

## 2015-03-25 NOTE — ED Notes (Signed)
BEHAVIORAL HEALTH ROUNDING Patient sleeping: Yes.   Patient alert and oriented: not applicable Behavior appropriate: Yes.    Nutrition and fluids offered: No Toileting and hygiene offered: No Sitter present: q15 minute observations Law enforcement present: Yes [employer] 

## 2015-03-25 NOTE — ED Notes (Signed)
Lunch provided along with an extra drink    Pt visualized with NAD  No verbalized needs or concerns at this time  Continue to monitor

## 2015-03-25 NOTE — ED Notes (Signed)
Pt escorted to X-ray by [employer] officer and tech.

## 2015-03-25 NOTE — ED Provider Notes (Signed)
-----------------------------------------   6:23 AM on 03/25/2015 -----------------------------------------   Blood pressure 149/93, pulse 103, temperature 98.3 F (36.8 C), temperature source Oral, resp. rate 18, height 5\' 1"  (1.549 m), weight 196 lb (88.905 kg), SpO2 97 %.  The patient had no acute events since last update.  Calm and cooperative at this time.  Disposition is pending per Psychiatry/Behavioral Medicine team recommendations.     Paulette Blanch, MD 03/25/15 [phone]

## 2015-03-25 NOTE — ED Notes (Addendum)
BEHAVIORAL HEALTH ROUNDING Patient sleeping: Yes.   Patient alert and oriented: no Behavior appropriate: Yes.  ; If no, describe:   Nutrition and fluids offered: No Toileting and hygiene offered: No Sitter present: no Law enforcement present: Yes  and [facility] 

## 2015-03-25 NOTE — ED Notes (Signed)
BEHAVIORAL HEALTH ROUNDING Patient sleeping: No. Patient alert and oriented: yes Behavior appropriate: Yes.  ; If no, describe:  Nutrition and fluids offered: yes Toileting and hygiene offered: Yes  Sitter present: q15 minute observations and security camera monitoring Law enforcement present: Yes  [facility]  

## 2015-03-25 NOTE — ED Notes (Signed)

## 2015-03-25 NOTE — ED Notes (Signed)
BEHAVIORAL HEALTH ROUNDING Patient sleeping: No. Patient alert and oriented: yes Behavior appropriate: Yes.  ; If no, describe:  Nutrition and fluids offered: Yes Toileting and hygiene offered: Yes  Sitter present: yes Law enforcement present: Yes [employer]  

## 2015-03-25 NOTE — ED Notes (Signed)
She is lying in bed  NAD observed  Continue to monitor

## 2015-03-25 NOTE — ED Notes (Signed)
ED BHU [place] Is the patient under IVC or is there intent for IVC: Yes.   Is the patient medically cleared: Yes.   Is there vacancy in the ED BHU: Yes.   Is the population mix appropriate for patient: Yes.   Is the patient awaiting placement in inpatient or outpatient setting: Yes.  inpt admission Has the patient had a psychiatric consult: Yes.   Survey of unit performed for contraband, proper placement and condition of furniture, tampering with fixtures in bathroom, shower, and each patient room: Yes.  ; Findings:  APPEARANCE/BEHAVIOR Calm and cooperative NEURO ASSESSMENT Orientation: oriented x3  Denies pain Hallucinations: No.None noted (Hallucinations) Speech: Normal Gait: normal RESPIRATORY ASSESSMENT Even  Unlabored respirations  CARDIOVASCULAR ASSESSMENT Pulses equal   regular rate  Skin warm and dry   GASTROINTESTINAL ASSESSMENT no GI complaint EXTREMITIES Full ROM  PLAN OF CARE Provide calm/safe environment. Vital signs assessed twice daily. ED BHU Assessment once each 12-hour shift. Collaborate with intake RN daily or as condition indicates. Assure the ED provider has rounded once each shift. Provide and encourage hygiene. Provide redirection as needed. Assess for escalating behavior; address immediately and inform ED provider.  Assess family dynamic and appropriateness for visitation as needed: Yes.  ; If necessary, describe findings:  Educate the patient/family about BHU procedures/visitation: Yes.  ; If necessary, describe findings:

## 2015-03-25 NOTE — ED Notes (Signed)
BEHAVIORAL HEALTH ROUNDING Patient sleeping: No. Patient alert and oriented: yes Behavior appropriate: Yes.   Nutrition and fluids offered: Yes  Toileting and hygiene offered: Yes  Sitter present: q15 min observations and security camera monitoring Law enforcement present: Yes [employer] 

## 2015-03-25 NOTE — Progress Notes (Signed)
This Probation officer spoke with Shirlee Limerick with [facility] who has accepted this patient pending chest xray and EKG results.  The patient has a pending being on 8/22 once results are confirmed by receiving facility.  TTS will follow up in the AM.  This writer informed [name]/Megan, RN of the need for testing results for placement and she agreed.      Chesley Noon, MSW, Darlyn Read [facility] Triage Specialist [phone]

## 2015-03-25 NOTE — ED Notes (Signed)
Breakfast provided  Pt observed with no unusual behavior  Appropriate to stimulation  No verbalized needs or concerns at this time  NAD assessed  Continue to monitor 

## 2015-03-25 NOTE — ED Provider Notes (Signed)
ECG reviewed and interpreted by me Ventricular rate 80 PR 174 QRS 140 QTc 5:15 Bifascicular block Possible LVH No obvious or new acute ischemic changes are noted, compared with previous twelve-lead there does not appear to be any acute change.  Delman Kitten, MD 03/25/15 [phone]

## 2015-03-25 NOTE — ED Notes (Signed)
BEHAVIORAL HEALTH ROUNDING Patient sleeping: Yes.   Patient alert and oriented: yes Behavior appropriate: Yes.  ; If no, describe:  Nutrition and fluids offered: Yes  Toileting and hygiene offered: Yes  Sitter present: yes Law enforcement present: Yes ODS  

## 2015-03-25 NOTE — ED Notes (Signed)
No meds ordered for this am  Assessment completed  Pt remains IVC with plan to admit to inpt psych unit  - preferably [facility] when staff/bed become available  Referred to other facilities also  - see TTS update  She denies pain, low fall risk and is a low violence risk

## 2015-03-25 NOTE — ED Notes (Signed)
Pt observed sitting on the side of the bed  Pt observed with no unusual behavior  Appropriate to stimulation  No verbalized needs or concerns at this time  NAD assessed  Continue to monitor

## 2015-03-25 NOTE — ED Notes (Signed)
Pt observed lying in bed   Pt visualized with NAD  No verbalized needs or concerns at this time  Continue to monitor 

## 2015-03-25 NOTE — ED Notes (Signed)
ED BHU [place] Is the patient under IVC or is there intent for IVC: Yes.   Is the patient medically cleared: Yes.   Is there vacancy in the ED BHU: Yes.   Is the population mix appropriate for patient: Yes.   Is the patient awaiting placement in inpatient or outpatient setting: Yes.   Has the patient had a psychiatric consult: Yes.   Survey of unit performed for contraband, proper placement and condition of furniture, tampering with fixtures in bathroom, shower, and each patient room: Yes.  ; Findings: all clear APPEARANCE/BEHAVIOR calm, cooperative and adequate rapport can be established NEURO ASSESSMENT Orientation: time, place and person Hallucinations: No.None noted (Hallucinations) Speech: Normal Gait: normal RESPIRATORY ASSESSMENT Normal expansion.  Clear to auscultation.  No rales, rhonchi, or wheezing. CARDIOVASCULAR ASSESSMENT regular rate and rhythm, S1, S2 normal, no murmur, click, rub or gallop GASTROINTESTINAL ASSESSMENT soft, nontender, BS WNL, no r/g EXTREMITIES normal strength, tone, and muscle mass PLAN OF CARE Provide calm/safe environment. Vital signs assessed twice daily. ED BHU Assessment once each 12-hour shift. Collaborate with intake RN daily or as condition indicates. Assure the ED provider has rounded once each shift. Provide and encourage hygiene. Provide redirection as needed. Assess for escalating behavior; address immediately and inform ED provider.  Assess family dynamic and appropriateness for visitation as needed: No.; If necessary, describe findings: unable to assess Educate the patient/family about BHU procedures/visitation: Yes.  ; If necessary, describe findings:    ENVIRONMENTAL ASSESSMENT Potentially harmful objects out of patient reach: Yes.   Personal belongings secured: Yes.   Patient dressed in hospital provided attire only: Yes.   Plastic bags out of patient reach: Yes.   Patient care equipment (cords, cables, call bells,  lines, and drains) shortened, removed, or accounted for: Yes.   Equipment and supplies removed from bottom of stretcher: Yes.   Potentially toxic materials out of patient reach: Yes.   Sharps container removed or out of patient reach: Yes.

## 2015-03-25 NOTE — ED Notes (Signed)

## 2015-03-25 NOTE — ED Notes (Signed)
ED BHU [place] Is the patient under IVC or is there intent for IVC: Yes.   Is the patient medically cleared: Yes.   Is there vacancy in the ED BHU: Yes.   Is the population mix appropriate for patient: Yes.   Is the patient awaiting placement in inpatient or outpatient setting: Yes.   Has the patient had a psychiatric consult: Yes.   Survey of unit performed for contraband, proper placement and condition of furniture, tampering with fixtures in bathroom, shower, and each patient room: Yes.  ; Findings:   APPEARANCE/BEHAVIOR calm and cooperative NEURO ASSESSMENT Orientation: time, place and person Hallucinations: No.None noted (Hallucinations) Speech: Normal Gait: normal RESPIRATORY ASSESSMENT No respiratory distress noted at this time. CARDIOVASCULAR ASSESSMENT regular rate and rhythm, S1, S2 normal, no murmur, click, rub or gallop GASTROINTESTINAL ASSESSMENT soft, nontender, BS WNL, no r/g EXTREMITIES normal strength, tone, and muscle mass PLAN OF CARE Provide calm/safe environment. Vital signs assessed twice daily. ED BHU Assessment once each 12-hour shift. Collaborate with intake RN daily or as condition indicates. Assure the ED provider has rounded once each shift. Provide and encourage hygiene. Provide redirection as needed. Assess for escalating behavior; address immediately and inform ED provider.  Assess family dynamic and appropriateness for visitation as needed: Yes.  ; If necessary, describe findings:  Educate the patient/family about BHU procedures/visitation: Yes.  ; If necessary, describe findings:

## 2015-03-25 NOTE — ED Notes (Signed)
Pt given dinner tray. Pt ambulated to the bathroom with no difficulty. Pt then returned to room.

## 2015-03-25 NOTE — ED Notes (Signed)
Returned phone call to Gina [name] [phone]  She was requesting an update   Phone given to the pt and they are actively talking at this time  Pt observed with no unusual behavior  Appropriate to stimulation  No verbalized needs or concerns at this time  NAD assessed  Continue to monitor

## 2015-03-25 NOTE — ED Notes (Signed)
NAD noted at this time. Pt visualized at this time laying in bed, TV is off at this time.

## 2015-03-26 DIAGNOSIS — Z9114 Patient's other noncompliance with medication regimen: Secondary | ICD-10-CM | POA: Diagnosis present

## 2015-03-26 DIAGNOSIS — K8020 Calculus of gallbladder without cholecystitis without obstruction: Secondary | ICD-10-CM | POA: Diagnosis not present

## 2015-03-26 DIAGNOSIS — Z91041 Radiographic dye allergy status: Secondary | ICD-10-CM | POA: Diagnosis not present

## 2015-03-26 DIAGNOSIS — R740 Nonspecific elevation of levels of transaminase and lactic acid dehydrogenase [LDH]: Secondary | ICD-10-CM | POA: Diagnosis not present

## 2015-03-26 DIAGNOSIS — F29 Unspecified psychosis not due to a substance or known physiological condition: Secondary | ICD-10-CM | POA: Insufficient documentation

## 2015-03-26 DIAGNOSIS — I10 Essential (primary) hypertension: Secondary | ICD-10-CM | POA: Diagnosis not present

## 2015-03-26 DIAGNOSIS — M109 Gout, unspecified: Secondary | ICD-10-CM | POA: Diagnosis present

## 2015-03-26 DIAGNOSIS — N183 Chronic kidney disease, stage 3 (moderate): Secondary | ICD-10-CM | POA: Diagnosis not present

## 2015-03-26 DIAGNOSIS — Z887 Allergy status to serum and vaccine status: Secondary | ICD-10-CM | POA: Diagnosis not present

## 2015-03-26 DIAGNOSIS — I4581 Long QT syndrome: Secondary | ICD-10-CM | POA: Diagnosis not present

## 2015-03-26 DIAGNOSIS — R7309 Other abnormal glucose: Secondary | ICD-10-CM | POA: Diagnosis present

## 2015-03-26 DIAGNOSIS — Z7951 Long term (current) use of inhaled steroids: Secondary | ICD-10-CM | POA: Diagnosis not present

## 2015-03-26 DIAGNOSIS — Z791 Long term (current) use of non-steroidal anti-inflammatories (NSAID): Secondary | ICD-10-CM | POA: Diagnosis not present

## 2015-03-26 DIAGNOSIS — R7301 Impaired fasting glucose: Secondary | ICD-10-CM | POA: Diagnosis not present

## 2015-03-26 DIAGNOSIS — D509 Iron deficiency anemia, unspecified: Secondary | ICD-10-CM | POA: Diagnosis present

## 2015-03-26 DIAGNOSIS — Z880 Allergy status to penicillin: Secondary | ICD-10-CM | POA: Diagnosis not present

## 2015-03-26 DIAGNOSIS — R4182 Altered mental status, unspecified: Secondary | ICD-10-CM | POA: Diagnosis not present

## 2015-03-26 DIAGNOSIS — F312 Bipolar disorder, current episode manic severe with psychotic features: Secondary | ICD-10-CM | POA: Diagnosis not present

## 2015-03-26 DIAGNOSIS — R9431 Abnormal electrocardiogram [ECG] [EKG]: Secondary | ICD-10-CM | POA: Diagnosis not present

## 2015-03-26 DIAGNOSIS — K5901 Slow transit constipation: Secondary | ICD-10-CM | POA: Diagnosis not present

## 2015-03-26 DIAGNOSIS — G4730 Sleep apnea, unspecified: Secondary | ICD-10-CM | POA: Diagnosis present

## 2015-03-26 DIAGNOSIS — R768 Other specified abnormal immunological findings in serum: Secondary | ICD-10-CM | POA: Diagnosis not present

## 2015-03-26 DIAGNOSIS — G4739 Other sleep apnea: Secondary | ICD-10-CM | POA: Diagnosis not present

## 2015-03-26 DIAGNOSIS — K7581 Nonalcoholic steatohepatitis (NASH): Secondary | ICD-10-CM | POA: Diagnosis not present

## 2015-03-26 DIAGNOSIS — K760 Fatty (change of) liver, not elsewhere classified: Secondary | ICD-10-CM | POA: Diagnosis not present

## 2015-03-26 DIAGNOSIS — I129 Hypertensive chronic kidney disease with stage 1 through stage 4 chronic kidney disease, or unspecified chronic kidney disease: Secondary | ICD-10-CM | POA: Diagnosis present

## 2015-03-26 DIAGNOSIS — F319 Bipolar disorder, unspecified: Secondary | ICD-10-CM | POA: Diagnosis present

## 2015-03-26 DIAGNOSIS — Z79899 Other long term (current) drug therapy: Secondary | ICD-10-CM | POA: Diagnosis not present

## 2015-03-26 DIAGNOSIS — N179 Acute kidney failure, unspecified: Secondary | ICD-10-CM | POA: Diagnosis not present

## 2015-03-26 DIAGNOSIS — Z85038 Personal history of other malignant neoplasm of large intestine: Secondary | ICD-10-CM | POA: Diagnosis not present

## 2015-03-26 DIAGNOSIS — F3113 Bipolar disorder, current episode manic without psychotic features, severe: Secondary | ICD-10-CM | POA: Diagnosis not present

## 2015-03-26 DIAGNOSIS — K648 Other hemorrhoids: Secondary | ICD-10-CM | POA: Diagnosis not present

## 2015-03-26 DIAGNOSIS — Z888 Allergy status to other drugs, medicaments and biological substances status: Secondary | ICD-10-CM | POA: Diagnosis not present

## 2015-03-26 LAB — COMPREHENSIVE METABOLIC PANEL
ALT: 61 U/L — ABNORMAL HIGH (ref 14–54)
AST: 59 U/L — ABNORMAL HIGH (ref 15–41)
Albumin: 4.3 g/dL (ref 3.5–5.0)
Alkaline Phosphatase: 119 U/L (ref 38–126)
Anion gap: 9 (ref 5–15)
BUN: 32 mg/dL — ABNORMAL HIGH (ref 6–20)
CO2: 26 mmol/L (ref 22–32)
Calcium: 9.7 mg/dL (ref 8.9–10.3)
Chloride: 104 mmol/L (ref 101–111)
Creatinine, Ser: 1.21 mg/dL — ABNORMAL HIGH (ref 0.44–1.00)
GFR calc Af Amer: 54 mL/min — ABNORMAL LOW (ref >60)
GFR calc non Af Amer: 46 mL/min — ABNORMAL LOW (ref >60)
Glucose, Bld: 111 mg/dL — ABNORMAL HIGH (ref 65–99)
Potassium: 4.6 mmol/L (ref 3.5–5.1)
Sodium: 139 mmol/L (ref 135–145)
Total Bilirubin: 0.4 mg/dL (ref 0.3–1.2)
Total Protein: 7.9 g/dL (ref 6.5–8.1)

## 2015-03-26 MED ORDER — SENNOSIDES-DOCUSATE SODIUM 8.6-50 MG PO TABS
ORAL_TABLET | ORAL | Status: AC
  Administered 2015-03-26: 2
  Filled 2015-03-26: qty 2

## 2015-03-26 MED ORDER — SENNOSIDES-DOCUSATE SODIUM 8.6-50 MG PO TABS
ORAL_TABLET | ORAL | Status: AC
  Administered 2015-03-26: 1
  Filled 2015-03-26: qty 2

## 2015-03-26 MED ORDER — DOCUSATE SODIUM 100 MG PO CAPS
200.0000 mg | ORAL_CAPSULE | Freq: Two times a day (BID) | ORAL | Status: DC
  Administered 2015-03-26 (×2): 200 mg via ORAL
  Filled 2015-03-26: qty 2

## 2015-03-26 MED ORDER — SENNA 8.6 MG PO TABS
2.0000 | ORAL_TABLET | Freq: Two times a day (BID) | ORAL | Status: DC
  Administered 2015-03-26: 17.2 mg via ORAL

## 2015-03-26 MED ORDER — WITCH HAZEL-GLYCERIN EX PADS: MEDICATED_PAD | CUTANEOUS | Status: DC | PRN

## 2015-03-26 MED ORDER — HYDROCORTISONE 1 % EX CREA
TOPICAL_CREAM | Freq: Three times a day (TID) | CUTANEOUS | Status: DC | PRN
  Administered 2015-03-26: 1 via TOPICAL
  Filled 2015-03-26: qty 28

## 2015-03-26 MED ORDER — DOCUSATE SODIUM 100 MG PO CAPS
ORAL_CAPSULE | ORAL | Status: AC
  Administered 2015-03-26: 200 mg via ORAL
  Filled 2015-03-26: qty 2

## 2015-03-26 NOTE — ED Notes (Signed)
Lunch provided along with an extra drink  Pt observed with no unusual behavior  Appropriate to stimulation  No verbalized needs or concerns at this time  NAD assessed  Continue to monitor 

## 2015-03-26 NOTE — ED Notes (Signed)
BEHAVIORAL HEALTH ROUNDING Patient sleeping: Yes.   Patient alert and oriented: no Behavior appropriate: Yes.  ; If no, describe:   Nutrition and fluids offered: No Toileting and hygiene offered: No Sitter present: no Law enforcement present: Yes  and ODS

## 2015-03-26 NOTE — ED Notes (Addendum)
She is lying in bed with her eyes closed  Respirations are even and unlabored  Pt observed with no unusual behavior    No verbalized needs or concerns at this time  NAD assessed  Continue to monitor

## 2015-03-26 NOTE — ED Notes (Signed)
Supper provided along with an extra drink  Pt observed with no unusual behavior  Appropriate to stimulation  No verbalized needs or concerns at this time  NAD assessed  Continue to monitor 

## 2015-03-26 NOTE — BHH Counselor (Signed)
Pt. has been accepted to [facility]. However, bed isn't available at that this time. [place] will call when bed opens up. They are waiting on discharges to occur. Accepting physician is Dr. Anastasia Fiedler.  Call report to [phone].  Representative was [employer].  ER Staff Holley Raring, ER Sect.; Dr. Marland Kitchen [name] T Patient's Nurse) have been made aware it.

## 2015-03-26 NOTE — ED Notes (Signed)
VSS   assessment completed   pt informed staff this am that she wears CPAP at hs  o2 sats this am 97%   She denies pain at this time

## 2015-03-26 NOTE — BHH Counselor (Signed)
EKG faxed to [facility], per their request and confirmed it was received ([phone]).

## 2015-03-26 NOTE — ED Notes (Signed)
Report called to [facility] and given to [employer]

## 2015-03-26 NOTE — ED Notes (Signed)
Am meds administered as ordered   Pt observed with no unusual behavior  Appropriate to stimulation  No verbalized needs or concerns at this time  NAD assessed  Continue to monitor 

## 2015-03-26 NOTE — ED Notes (Signed)
ENCOURAGE FLUIDS AND APPLE FOR CONSTIPATION

## 2015-03-26 NOTE — ED Notes (Signed)
She has completed her [place] Patient sleeping: No. Patient alert and oriented: yes Behavior appropriate: Yes.  ; If no, describe:  Nutrition and fluids offered: yes Toileting and hygiene offered: Yes  Sitter present: q15 minute observations and security camera monitoring Law enforcement present: Yes  ODS

## 2015-03-26 NOTE — ED Notes (Signed)
Pt is dressed and is leaving the unit at this time  Pt to [facility] transferring with [facility]  Pt observed with no unusual behavior  Appropriate to stimulation  No verbalized needs or concerns at this time  NAD assessed  Continue to monitor

## 2015-03-26 NOTE — ED Notes (Signed)
Pt given lunch tray and is taking shower

## 2015-03-26 NOTE — ED Notes (Signed)
BEHAVIORAL HEALTH ROUNDING Patient sleeping: No. Patient alert and oriented: yes Behavior appropriate: Yes.  ; If no, describe:  Nutrition and fluids offered: yes Toileting and hygiene offered: Yes  Sitter present: q15 minute observations and security camera monitoring Law enforcement present: Yes  [facility]  

## 2015-03-26 NOTE — ED Notes (Signed)
Pt given breakfast tray

## 2015-03-26 NOTE — Consult Note (Signed)
[facility] Face-to-Face Psychiatry Consult   Reason for Consult: History of bipolar disorder. Has refused medications for 4 days with +AH. Was found outside wandering and has been more agitated.  Referring Physician:  Lisa Roca, M.D.  Patient Identification: Gina [name] MRN:  [id number] Principal Diagnosis: Bipolar disorder   Diagnosis:   Patient Active Problem List   Diagnosis Date Noted  . Bipolar disorder [F31.9] 03/23/2015  . Genetic testing [Z31.5] 03/21/2015  . Family history of breast cancer [Z80.3]   . Family history of colon cancer [Z80.0]   . Family history of stomach cancer [Z80.0]   . History of anemia [Z86.2] 11/27/2014  . Bipolar affective disorder, current episode manic [F31.9] 11/27/2014  . Bladder cancer [C67.9] 11/27/2014  . History of urinary anomaly [Z87.448] 11/27/2014  . Uterine cancer [C55] 11/27/2014  . Allergic rhinitis [J30.9] 11/03/2014  . Contact dermatitis [L25.9] 11/03/2014  . Advanced care planning/counseling discussion [Z71.89] 10/11/2014  . Memory deficit [R41.3] 09/13/2014  . Positive hepatitis C antibody test [R89.4] 09/04/2014  . Abnormal mental state [R41.82] 09/04/2014  . Amnesia [R41.3] 09/04/2014  . Thrombocytopenia [D69.6] 08/12/2014  . Prediabetes [R73.09] 06/07/2014  . HTN (hypertension) [I10] 06/07/2014  . Trouble in sleeping [G47.9] 05/10/2014  . Right knee pain [M25.561] 03/16/2014  . Sleep apnea [G47.30] 02/13/2014  . Hyperlipidemia [E78.5]   . IDA (iron deficiency anemia) [D50.9] 01/26/2013  . History of colonic polyps [Z86.010] 11/11/2012  . Recurrent falls [R29.6] 10/02/2012  . Nasal congestion [R09.81] 10/02/2012  . Medicare annual wellness visit, subsequent [Z00.00] 08/27/2012  . Urine incontinence [R32] 08/27/2012  . Therapeutic drug monitoring [Z51.81] 08/16/2012  . Recurrent ventral hernia [K43.2] 08/11/2012  . OSA on CPAP [G47.33]   . Intertrigo [L30.4] 05/27/2012  . CKD (chronic kidney disease) stage 3, GFR  30-59 ml/min [N18.3]   . Obesity (BMI 30-39.9) [E66.9]   . Colon cancer [C18.9] 04/07/2012  . Enterocutaneous fistula [K63.2] 04/07/2012  . Bipolar 1 disorder [F31.9] 04/07/2012    Total Time spent with patient: 30 minutes  Subjective:   Gina [name] is a 64 y.o. female patient admitted with a h/o bipolar disorder. The pt spoke and her sister spoke with Dr. Reita Cliche. The pt's sister shared the pt has been off of her psychotropic medications for about 4 months. She was found walking outside and has been more agitated recently.  The pt's sister also shared the pt experienced auditory hallucinations but those, per the pt, have resolved.   On interview, Gina [name] explained that I should call her whatever I would like to call her. She mentioned the name Gina [name] and stated I could call her that if I would like. The pt is resting comfortably in bed, holding a styrofoam cup in her right hand and transfers it back and forth between her hands several times during the interview. She has a short haircut and wears glasses.  She fidgets frequently during the interview & when asked what brought her to the hospital this evening, the pt stated, "I won't go too far back" and began talking about her childhood. The pt discussed a history of thorazine use and frequent medication changes. She shared she saw "[place]" which is her name for Gina [name] when she was child. She would not answer as to whether she saw Gina [name] recently. She is hyperreligious and speaks about reading the bible and people needing to be read the bible more.    The pt believes she last took her psychotropic medications last night but she does  not recall the names of her medications. Pt shared she is wide awake and she her sleep has been variable but decreased. During the interview, the pt is easily distracted with flight of ideas, increased activity, and talkative. Evidence of irritability and grandiosity were absent.   Currently the pt denies SI,  HI and AVH. She denies there are weapons at home.    Update as of Monday the 22nd. Patient with bipolar disorder. On interview today she continues to be disorganized and loose and emotionally labile. Giggling frequently. Her stories make very little sense but she seems happy enough. Poor insight and judgment. Tolerant of medication. Vital signs still showing an elevation of blood pressure. Patient is agreeable to the idea of being transferred to a psychiatric hospital. HPI Elements:   Location:  see HPI. Quality:  mild to moderate. Severity:  worsening. Timing:  past 4 days. Duration:  4 days. Context:  discontinuing psychotropic medications.  Past Psychiatric History: Psychiatrist: Dr. Jimmye Norman at [facility] Therapist: None Hospitalizations: Several including [facility] in the past ECT: "I can't remember. It comes and goes."  Suicide attempt/Self-harm: "I don't remember"  Homicide attempts/harming others: "Everybody is going to say they need organ donors."   Past Medical History:  Past Medical History  Diagnosis Date  . Bipolar depression     sees psychiatrist - psych admission 03/2015  . DJD (degenerative joint disease), lumbar     chronic lower back pain  . Obesity (BMI 30-39.9)   . History of colon cancer     s/p surgery  . 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 OSA on CPAP     6cm H2O  . RBBB   . Anemia in chronic kidney disease   . Blood transfusion without reported diagnosis 2009  . Cataract     left  . GERD (gastroesophageal reflux disease)   . Hyperlipidemia   . Hypertension   . History of bladder cancer 1997  . History of uterine cancer     s/p hysterectomy  . CKD (chronic kidney disease) stage 3, GFR 30-59 ml/min   . Personal history of colonic adenomas and colon cancer 11/11/2012  . IDA (iron deficiency anemia) 01/2013    thought due to h/o polyps  . Positive hepatitis C antibody test 09/2014     but negative confirmatory testing  . Colon cancer 1990's  . Family history of breast cancer   . Family history of colon cancer   . Family history of stomach cancer     Past Surgical History  Procedure Laterality Date  . Hernia repair  02/04/12  . Partial colectomy  about 2008    for colon cancer  . I & d abdominal wound  02/19/12  . Removal of infected mesh and abdominal wound vac placement  02/24/12  . Reexploration of abdominal wound and allograft placemet  02/26/12  . Partial hysterectomy  1981    uterine cancer, R ovary remains  . Left oophorectomy  2005  . Colonoscopy  07/2009  . Dexa  12/2009    WNL  . Dobutamine stress echo  12/2009    no evidence of ischemia  . Colonoscopy  11/2012    2 tubular adenomas, mild diverticulosis, pending genetic testing for [name] syndrome [name]) rpt 2 yrs  . Sleep study  02/2014    OSA - [facility] 55, nadir 81% [name])  . Breast biopsy Right 01/2014    fibroadenoma  . Colonoscopy  02/2015    TA, diverticulosis, rpt 2 yrs [name])   Family History:  Family History  Problem Relation Age of Onset  . Colon cancer Mother 19  . Stomach cancer Mother     dx in her 67s?  Marland Kitchen Colon cancer Sister 92    Maternal half sister  . CAD Father     MI  . Diabetes Other     aunts and uncles both sides  . Arthritis Other     strong fmhx  . Mental illness Sister     anxiety/depression  . Breast cancer Maternal Grandmother   . Esophageal cancer Neg Hx   . Rectal cancer Neg Hx    Social History:  History  Alcohol Use No     History  Drug Use No    Social History   Social History  . Marital Status: Single    Spouse Name: N/A  . Number of Children: 0  . Years of Education: N/A   Social History Main Topics  . Smoking status: Never Smoker   . Smokeless tobacco: Never Used  . Alcohol Use: No  . Drug Use: No  . Sexual Activity: No   Other Topics Concern  . None   Social History Narrative   Lives with sister, no pets   Occupation: disabled, for  bipolar and arthritis   Edu: GED   Activity: take walks   Diet: good water, vegetables daily      Psych - [place], Dr. Jimmye Norman (ph [phone])   Additional Social History:    History of alcohol / drug use?: No history of alcohol / drug abuse (Pt denies alcohol use)  Allergies:   Allergies  Allergen Reactions  . Penicillins Other (See Comments)    Pt states that it knocks her out for awhile.    Marland Kitchen Risperdal [Risperidone] Other (See Comments)    Reaction:  Memory loss/AMS evaluated by Dr Melrose Nakayama [facility] at [facility]  . Ivp Dye [Iodinated Diagnostic Agents] Rash  . Tetanus Toxoids Rash       . Zetia [Ezetimibe] Rash    Labs:  Results for orders placed or performed during the hospital encounter of 03/23/15 (from the past 48 hour(s))  Comprehensive metabolic panel     Status: Abnormal   Collection Time: 03/26/15  9:48 AM  Result Value Ref Range   Sodium 139 135 - 145 mmol/L   Potassium 4.6 3.5 - 5.1 mmol/L   Chloride 104 101 - 111 mmol/L   CO2 26 22 - 32 mmol/L   Glucose, Bld 111 (H) 65 - 99 mg/dL   BUN 32 (H) 6 - 20 mg/dL   Creatinine, Ser 1.21 (H) 0.44 - 1.00 mg/dL   Calcium 9.7 8.9 - 10.3 mg/dL   Total Protein 7.9 6.5 - 8.1 g/dL   Albumin 4.3 3.5 - 5.0 g/dL   AST 59 (H) 15 - 41 U/L   ALT 61 (H) 14 - 54 U/L   Alkaline Phosphatase 119 38 - 126 U/L   Total Bilirubin 0.4 0.3 - 1.2 mg/dL   GFR calc non Af Amer 46 (L) >60 mL/min   GFR calc Af Amer 54 (L) >60 mL/min    Comment: (NOTE) The eGFR has been calculated using the CKD EPI equation. This calculation has not been validated in all clinical situations. eGFR's persistently <60 mL/min signify possible Chronic Kidney Disease.    Anion gap 9 5 - 15    Vitals: Blood pressure 140/91, pulse 87,  temperature 97.5 F (36.4 C), temperature source Oral, resp. rate 16, height _0  (1.549 m), weight 88.905 kg (196 lb), SpO2 97 %.  Risk to Self: Suicidal Ideation: No Suicidal Intent: No Is patient at risk for  suicide?: No Suicidal Plan?: No Access to Means: No What has been your use of drugs/alcohol within the last 12 months?: None reported How many times?: 0 Other Self Harm Risks: None reported Triggers for Past Attempts: None known Intentional Self Injurious Behavior: None Risk to Others: Homicidal Ideation: No Thoughts of Harm to Others: No Current Homicidal Intent: No Current Homicidal Plan: No Access to Homicidal Means: No Identified Victim: N/A History of harm to others?: No Assessment of Violence: None Noted Violent Behavior Description: None reported Does patient have access to weapons?: No Criminal Charges Pending?: No Does patient have a court date: No Prior Inpatient Therapy: Prior Inpatient Therapy: No Prior Outpatient Therapy: Prior Outpatient Therapy: Yes Prior Therapy Facilty/Provider(s): [facility] Does patient have an ACCT team?: No Does patient have Intensive In-House Services?  : No Does patient have [facility] services? : No Does patient have P4CC services?: No  Current Facility-Administered Medications  Medication Dose Route Frequency Provider Last Rate Last Dose  . docusate sodium (COLACE) capsule 200 mg  200 mg Oral BID Carrie Mew, MD   200 mg at 03/26/15 0957  . hydrocortisone cream 1 %   Topical TID PRN Carrie Mew, MD   1 application at 63/81/77 0445  . QUEtiapine (SEROQUEL XR) 24 hr tablet 100 mg  100 mg Oral QHS Donita Brooks, MD   100 mg at 03/25/15 2158  . senna (SENOKOT) tablet 17.2 mg  2 tablet Oral BID Carrie Mew, MD   17.2 mg at 03/26/15 1165  . witch hazel-glycerin (TUCKS) pad   Topical PRN Carrie Mew, MD       Current Outpatient Prescriptions  Medication Sig Dispense Refill  . allopurinol (ZYLOPRIM) 100 MG tablet Take 1 tablet (100 mg total) by mouth daily. 30 tablet 2  . Cholecalciferol (VITAMIN D3) 400 UNITS CAPS Take 400 Units by mouth daily.    Marland Kitchen dicyclomine (BENTYL) 20 MG tablet Take 20 mg by mouth every 6 (six) hours as  needed for spasms.    Marland Kitchen FLUoxetine (PROZAC) 20 MG capsule Take 20 mg by mouth daily.    . hydrOXYzine (ATARAX/VISTARIL) 50 MG tablet Take 1 tablet (50 mg total) by mouth at bedtime as needed (insomnia). 30 tablet 6  . indomethacin (INDOCIN) 50 MG capsule Take 50 mg by mouth 2 (two) times daily with a meal.    . lovastatin (MEVACOR) 40 MG tablet Take 1 tablet (40 mg total) by mouth at bedtime. 90 tablet 3  . metoprolol tartrate (LOPRESSOR) 25 MG tablet Take 1.5 tablets (37.5 mg total) by mouth 2 (two) times daily. 270 tablet 3  . nystatin ointment (MYCOSTATIN) Apply 1 application topically 2 (two) times daily. 60 g 1  . ondansetron (ZOFRAN) 4 MG tablet Take 4 mg by mouth every 8 (eight) hours as needed for nausea or vomiting.    Marland Kitchen QUEtiapine (SEROQUEL) 50 MG tablet Take 1 and 1/2 tablets at bedtime for 7 days and then increase to 2 tablets at bedtime. (Patient taking differently: Take 100 mg by mouth at bedtime. ) 60 tablet 4  . sennosides-docusate sodium (SENOKOT-S) 8.6-50 MG tablet Take 1 tablet by mouth daily as needed for constipation.    . simethicone (MYLICON) 790 MG chewable tablet Chew 125 mg by mouth every 6 (  six) hours as needed for flatulence.    Marland Kitchen azelastine (ASTELIN) 0.1 % nasal spray Place 1 spray into both nostrils 2 (two) times daily. Use in each nostril as directed (Patient not taking: Reported on 03/23/2015) 30 mL 12  . fluticasone (FLONASE) 50 MCG/ACT nasal spray Place 2 sprays into both nostrils daily. (Patient not taking: Reported on 03/23/2015) 16 g 6  . VOLTAREN 1 % GEL APPLY TO AFFECTED AREA THREE TIMES DAILY (Patient not taking: Reported on 01/31/2015) 100 g 1    Musculoskeletal: Strength & Muscle Tone: within normal limits Gait & Station: not examined Patient leans: N/A  Psychiatric Specialty Exam: Physical Exam  Nursing note and vitals reviewed. Constitutional: She appears well-developed and well-nourished.  HENT:  Head: Normocephalic and atraumatic.  Eyes:  Conjunctivae are normal. Pupils are equal, round, and reactive to light.  Neck: Normal range of motion.  Cardiovascular: Normal heart sounds.   Respiratory: Effort normal.  GI: Soft.  Musculoskeletal: Normal range of motion.  Neurological: She is alert.  Skin: Skin is warm and dry.  Psychiatric: Her behavior is normal. Thought content normal. Her affect is labile. Her speech is rapid and/or pressured. She expresses impulsivity. She exhibits abnormal recent memory.    Review of Systems  Constitutional: Negative.   HENT: Negative.   Eyes: Negative.   Respiratory: Negative.   Cardiovascular: Negative.   Gastrointestinal: Negative.   Genitourinary: Negative.   Musculoskeletal: Negative.   Skin: Negative.   Neurological: Negative.   Endo/Heme/Allergies: Negative.   Psychiatric/Behavioral: Positive for depression. Negative for suicidal ideas, hallucinations, memory loss and substance abuse. The patient has insomnia. The patient is not nervous/anxious.     Blood pressure 140/91, pulse 87, temperature 97.5 F (36.4 C), temperature source Oral, resp. rate 16, height _0  (1.549 m), weight 88.905 kg (196 lb), SpO2 97 %.Body mass index is 37.05 kg/(m^2).  General Appearance: Casual and Fairly Groomed  Engineer, water::  Good  Speech:  Rapid  Volume:  Normal  Mood:  Elevated  Affect:  Mood congruent  Thought Process:  Circumstantial, Disorganized and Tangential  Orientation:  Full (Time, Place, and Person)  Thought Content:  See HPI  Suicidal Thoughts:  No  Homicidal Thoughts:  No  Memory:  Immediate;   Fair Recent;   Fair Remote;   Fair  Judgement:  Impaired  Insight:  Lacking  Psychomotor Activity:  Restlessness  Concentration:  Fair  Recall:  [employer] of Knowledge:Fair  Language: Good  Akathisia:  Negative  Handed:  Right  AIMS (if indicated):     Assets:  Wellsite geologist  ADL's:  Intact  Cognition: WNL  Sleep:  variable but  decreased overall   Medical Decision Making: Review of Psycho-Social Stressors (1), Review or order clinical lab tests (1), Established Problem, Worsening (2), Review or order medicine tests (1), Review of Medication Regimen & Side Effects (2) and Review of New Medication or Change in Dosage (2)  Treatment Plan Summary: Medication management and Plan IVC to seek appropriate placement.  Start Seroquel XR 69m po QHS.   Plan:  Recommend psychiatric Inpatient admission when medically cleared.   [facility] Supportive counseling and education done with the patient who is agreeable to the plan. No change to medicine for today. Disposition: Inpatient psychiatric hospitalization  JAlethia Berthold8/22/2016 5:46 PM

## 2015-03-26 NOTE — ED Notes (Addendum)
Breakfast provided   She is sitting on the side of the bed    appropriate to stimulation  No verbalized needs or concerns at this time  NAD assessed  Continue to monitor

## 2015-03-26 NOTE — ED Notes (Addendum)
BEHAVIORAL HEALTH ROUNDING Patient sleeping: Yes.   Patient alert and oriented: no Behavior appropriate: Yes.  ; If no, describe:   Nutrition and fluids offered: No Toileting and hygiene offered: No Sitter present: no Law enforcement present: Yes  and [facility] 

## 2015-03-26 NOTE — ED Notes (Signed)
ED BHU [place] Is the patient under IVC or is there intent for IVC: Yes.   Is the patient medically cleared: Yes.   Is there vacancy in the ED BHU: Yes.   Is the population mix appropriate for patient: Yes.   Is the patient awaiting placement in inpatient or outpatient setting: Yes.  ? Acceptance to [facility] Has the patient had a psychiatric consult: Yes.   Survey of unit performed for contraband, proper placement and condition of furniture, tampering with fixtures in bathroom, shower, and each patient room: Yes.  ; Findings:  APPEARANCE/BEHAVIOR Calm and cooperative NEURO ASSESSMENT Orientation: oriented x3  Denies pain Hallucinations: No.None noted (Hallucinations) Speech: Normal Gait: normal RESPIRATORY ASSESSMENT Even  Unlabored respirations  CARDIOVASCULAR ASSESSMENT Pulses equal   regular rate  Skin warm and dry   GASTROINTESTINAL ASSESSMENT no GI complaint EXTREMITIES Full ROM  PLAN OF CARE Provide calm/safe environment. Vital signs assessed twice daily. ED BHU Assessment once each 12-hour shift. Collaborate with intake RN daily or as condition indicates. Assure the ED provider has rounded once each shift. Provide and encourage hygiene. Provide redirection as needed. Assess for escalating behavior; address immediately and inform ED provider.  Assess family dynamic and appropriateness for visitation as needed: Yes.  ; If necessary, describe findings:  Educate the patient/family about BHU procedures/visitation: Yes.  ; If necessary, describe findings:

## 2015-03-26 NOTE — ED Notes (Signed)
Pt to toilet having difficult stool, Dr Joni Fears notified, meds ordered

## 2015-03-26 NOTE — ED Notes (Signed)
ENVIRONMENTAL ASSESSMENT Potentially harmful objects out of patient reach: Yes.   Personal belongings secured: Yes.   Patient dressed in hospital provided attire only: Yes.   Plastic bags out of patient reach: Yes.   Patient care equipment (cords, cables, call bells, lines, and drains) shortened, removed, or accounted for: Yes.   Equipment and supplies removed from bottom of stretcher: Yes.   Potentially toxic materials out of patient reach: Yes.   Sharps container removed or out of patient reach: Yes.  BEHAVIORAL HEALTH ROUNDING Patient sleeping: No. Patient alert and oriented: yes Behavior appropriate: Yes.  ; If no, describe:  Nutrition and fluids offered: yes Toileting and hygiene offered: Yes  Sitter present: q15 minute observations and security camera monitoring Law enforcement present: Yes  ODS

## 2015-03-26 NOTE — ED Notes (Signed)
Received from TTS verification that she has been accepted to [facility] - she may transfer once they discharge pts today and after they inform us of her bed assignment

## 2015-03-26 NOTE — ED Notes (Signed)
BEHAVIORAL HEALTH ROUNDING Patient sleeping: No. Patient alert and oriented: yes Behavior appropriate: Yes.  ; If no, describe: pt unable to sleep, sitting onn b ed Nutrition and fluids offered: Yes  Toileting and hygiene offered: Yes  Sitter present: no Law enforcement present: Yes  and ODS

## 2015-03-27 DIAGNOSIS — R740 Nonspecific elevation of levels of transaminase and lactic acid dehydrogenase [LDH]: Secondary | ICD-10-CM

## 2015-03-27 DIAGNOSIS — R7401 Elevation of levels of liver transaminase levels: Secondary | ICD-10-CM | POA: Insufficient documentation

## 2015-03-27 DIAGNOSIS — Z8679 Personal history of other diseases of the circulatory system: Secondary | ICD-10-CM | POA: Insufficient documentation

## 2015-03-27 DIAGNOSIS — M109 Gout, unspecified: Secondary | ICD-10-CM | POA: Insufficient documentation

## 2015-03-27 DIAGNOSIS — R7301 Impaired fasting glucose: Secondary | ICD-10-CM | POA: Insufficient documentation

## 2015-03-27 DIAGNOSIS — R768 Other specified abnormal immunological findings in serum: Secondary | ICD-10-CM | POA: Insufficient documentation

## 2015-03-27 DIAGNOSIS — Z8551 Personal history of malignant neoplasm of bladder: Secondary | ICD-10-CM | POA: Insufficient documentation

## 2015-03-28 DIAGNOSIS — K8020 Calculus of gallbladder without cholecystitis without obstruction: Secondary | ICD-10-CM | POA: Insufficient documentation

## 2015-03-28 DIAGNOSIS — K760 Fatty (change of) liver, not elsewhere classified: Secondary | ICD-10-CM | POA: Insufficient documentation

## 2015-03-30 DIAGNOSIS — N179 Acute kidney failure, unspecified: Secondary | ICD-10-CM | POA: Insufficient documentation

## 2015-03-30 DIAGNOSIS — N183 Chronic kidney disease, stage 3 unspecified: Secondary | ICD-10-CM | POA: Insufficient documentation

## 2015-04-11 ENCOUNTER — Telehealth: Payer: Self-pay

## 2015-04-11 NOTE — Telephone Encounter (Signed)
Devoria Glassing pts sister (DPR signed) said pt was just discharged from hospital and given all new meds that pt cannot get until 04/12/15. Spoke with Pam at [facility] is working on getting meds ready now; Lelan Pons is to call Pam after 12:30 pm today and Lelan Pons will be notified if any of the meds have to be ordered and if ordered will be in on 04/12/15; pt will be able to pick up the meds that are in stock now. If any questions about meds pt will discuss with psychiatrist that she is seeing on 04/12/15. Lelan Pons voiced understanding.

## 2015-04-12 ENCOUNTER — Encounter: Payer: Self-pay | Admitting: Psychiatry

## 2015-04-12 ENCOUNTER — Encounter (HOSPITAL_COMMUNITY): Payer: Self-pay

## 2015-04-12 ENCOUNTER — Ambulatory Visit (INDEPENDENT_AMBULATORY_CARE_PROVIDER_SITE_OTHER): Payer: Medicare Other | Admitting: Psychiatry

## 2015-04-12 VITALS — BP 138/86 | HR 116 | Temp 98.3°F | Ht 60.5 in | Wt 186.6 lb

## 2015-04-12 DIAGNOSIS — F3170 Bipolar disorder, currently in remission, most recent episode unspecified: Secondary | ICD-10-CM | POA: Diagnosis not present

## 2015-04-12 MED ORDER — CLONAZEPAM 1 MG PO TABS: 1.0000 mg | ORAL_TABLET | Freq: Two times a day (BID) | ORAL | Status: DC

## 2015-04-12 MED ORDER — CARBAMAZEPINE ER 100 MG PO TB12: 300.0000 mg | ORAL_TABLET | Freq: Two times a day (BID) | ORAL | Status: DC

## 2015-04-12 MED ORDER — OLANZAPINE 20 MG PO TABS: 20.0000 mg | ORAL_TABLET | Freq: Every morning | ORAL | Status: DC

## 2015-04-12 NOTE — Progress Notes (Signed)
[place] MD/PA/NP OP Progress Note  04/12/2015 9:11 AM Gina [name]  MRN:  [id number]  Subjective:  Patient returns for follow-up for bipolar disorder. Patient returns a follow-up or bipolar disorder. Patient resents to the appointment with pressured speech. Review of documentation indicates she was admitted to the hospital for medication noncompliance for 4 days. It appears she presented to the emergency room here on 03/26/2015. She then had a hospitalization at [facility] and was released on 04/10/2015. However I spoke with patient's sister today by phone and she indicates that there was some mix up with the hospital getting the patient medications and as such the patient is been off of her medication since discharge.  She denies any psychotic symptoms. She denies any suicidal or homicidal ideation. Thus I'm going to order her discharge medications at this time. Patient is pleasant but disorganized at this time. She does respond and a tangential fashion but her behavior is calm.  Chief Complaint:  Chief Complaint    Follow-up; Medication Refill     Visit Diagnosis:     ICD-9-CM ICD-10-CM   1. Bipolar disorder in full remission, most recent episode unspecified type 296.80 F31.70     Past Medical History:  Past Medical History  Diagnosis Date  . Bipolar depression     sees psychiatrist - psych admission 03/2015  . DJD (degenerative joint disease), lumbar     chronic lower back pain  . Obesity (BMI 30-39.9)   . History of colon cancer     s/p surgery  . 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 OSA on CPAP     6cm H2O  . RBBB   . Anemia in chronic kidney disease   . Blood transfusion without reported diagnosis 2009  . Cataract     left  . GERD (gastroesophageal reflux disease)   . Hyperlipidemia   . Hypertension   . History of bladder cancer 1997  . History of uterine cancer     s/p hysterectomy  . CKD (chronic kidney disease)  stage 3, GFR 30-59 ml/min   . Personal history of colonic adenomas and colon cancer 11/11/2012  . IDA (iron deficiency anemia) 01/2013    thought due to h/o polyps  . Positive hepatitis C antibody test 09/2014    but negative confirmatory testing  . Colon cancer 1990's  . Family history of breast cancer   . Family history of colon cancer   . Family history of stomach cancer     Past Surgical History  Procedure Laterality Date  . Hernia repair  02/04/12  . Partial colectomy  about 2008    for colon cancer  . I & d abdominal wound  02/19/12  . Removal of infected mesh and abdominal wound vac placement  02/24/12  . Reexploration of abdominal wound and allograft placemet  02/26/12  . Partial hysterectomy  1981    uterine cancer, R ovary remains  . Left oophorectomy  2005  . Colonoscopy  07/2009  . Dexa  12/2009    WNL  . Dobutamine stress echo  12/2009    no evidence of ischemia  . Colonoscopy  11/2012    2 tubular adenomas, mild diverticulosis, pending genetic testing for Lynch syndrome [name]) rpt 2 yrs  . Sleep study  02/2014    OSA - [facility] 55, nadir 81% [name])  . Breast biopsy Right 01/2014    fibroadenoma  . Colonoscopy  02/2015    TA,  diverticulosis, rpt 2 yrs [name])   Family History:  Family History  Problem Relation Age of Onset  . Colon cancer Mother 49  . Stomach cancer Mother     dx in her 70s?  Marland Kitchen Colon cancer Sister 61    Maternal half sister  . CAD Father     MI  . Diabetes Other     aunts and uncles both sides  . Arthritis Other     strong fmhx  . Mental illness Sister     anxiety/depression  . Breast cancer Maternal Grandmother   . Esophageal cancer Neg Hx   . Rectal cancer Neg Hx    Social History:  Social History   Social History  . Marital Status: Single    Spouse Name: N/A  . Number of Children: 0  . Years of Education: N/A   Social History Main Topics  . Smoking status: Never Smoker   . Smokeless tobacco: Never Used  . Alcohol Use: No  .  Drug Use: No  . Sexual Activity: No   Other Topics Concern  . None   Social History Narrative   Lives with sister, no pets   Occupation: disabled, for bipolar and arthritis   Edu: GED   Activity: take walks   Diet: good water, vegetables daily      Psych - [place], Dr. Jimmye Norman (ph [phone])   Additional History:   Assessment:   Musculoskeletal: Strength & Muscle Tone: within normal limits Gait & Station: Patient has normal gait but ambulates slowly Patient leans: N/A  Psychiatric Specialty Exam: HPI  Review of Systems  Psychiatric/Behavioral: Negative for depression, suicidal ideas, hallucinations, memory loss and substance abuse. The patient has insomnia. The patient is not nervous/anxious.     Blood pressure 138/86, pulse 116, temperature 98.3 F (36.8 C), temperature source Tympanic, height 5' 0.5" (1.537 m), weight 186 lb 9.6 oz (84.641 kg), SpO2 94 %.Body mass index is 35.83 kg/(m^2).  General Appearance: Well Groomed  Eye Contact:  Good  Speech:  Pressured  Volume:  Normal  Mood:  Good  Affect:  Congruent  Thought Process:  Circumstantial and Disorganized  Orientation:  Full (Time, Place, and Person)  Thought Content:  Negative  Suicidal Thoughts:  No  Homicidal Thoughts:  No  Memory:  Immediate;   Good Recent;   Good Remote;   Fair  Judgement:  Good  Insight:  Good  Psychomotor Activity:  Negative  Concentration:  Good  Recall:  [place] of Knowledge: Fair  Language: Good  Akathisia:  Negative  Handed:  Right unknown  AIMS (if indicated):  Normal- Done on 12/12/14 in [employer]  Assets:  Desire for Improvement  ADL's:  Intact  Cognition: WNL  Sleep:  Good   Is the patient at risk to self?  No. Has the patient been a risk to self in the past 6 months?  No. Has the patient been a risk to self within the distant past?  No. Is the patient a risk to others?  No. Has the patient been a risk to others in the past 6 months?  No. Has  the patient been a risk to others within the distant past?  No.  Current Medications: Current Outpatient Prescriptions  Medication Sig Dispense Refill  . allopurinol (ZYLOPRIM) 100 MG tablet Take 1 tablet (100 mg total) by mouth daily. 30 tablet 2  . amLODipine (NORVASC) 10 MG tablet Take 10 mg by mouth.    Marland Kitchen  amLODipine (NORVASC) 10 MG tablet     . azelastine (ASTELIN) 0.1 % nasal spray Place 1 spray into both nostrils 2 (two) times daily. Use in each nostril as directed 30 mL 12  . carbamazepine (TEGRETOL XR) 100 MG 12 hr tablet Take 300 mg by mouth.    . Cholecalciferol (VITAMIN D3) 400 UNITS CAPS Take 400 Units by mouth daily.    . clonazePAM (KLONOPIN) 1 MG tablet     . clonazePAM (KLONOPIN) 1 MG tablet Take 1 tablet (1 mg total) by mouth 2 (two) times daily. 60 tablet 1  . diclofenac (VOLTAREN) 0.1 % ophthalmic solution 1 drop.    . diclofenac (VOLTAREN) 0.1 % ophthalmic solution     . dicyclomine (BENTYL) 20 MG tablet Take 20 mg by mouth every 6 (six) hours as needed for spasms.    Marland Kitchen FLUoxetine (PROZAC) 20 MG capsule Take 20 mg by mouth daily.    . fluticasone (FLONASE) 50 MCG/ACT nasal spray Place 2 sprays into both nostrils daily. 16 g 6  . hydrocortisone (ANUSOL-HC) 2.5 % rectal cream Place rectally.    . hydrOXYzine (ATARAX/VISTARIL) 50 MG tablet Take 1 tablet (50 mg total) by mouth at bedtime as needed (insomnia). 30 tablet 6  . indomethacin (INDOCIN) 50 MG capsule Take 50 mg by mouth 2 (two) times daily with a meal.    . lovastatin (MEVACOR) 40 MG tablet Take 1 tablet (40 mg total) by mouth at bedtime. 90 tablet 3  . metoprolol tartrate (LOPRESSOR) 25 MG tablet Take 1.5 tablets (37.5 mg total) by mouth 2 (two) times daily. 270 tablet 3  . nystatin ointment (MYCOSTATIN) Apply 1 application topically 2 (two) times daily. 60 g 1  . OLANZapine (ZYPREXA) 20 MG tablet Take 1 tablet (20 mg total) by mouth every morning. 30 tablet 1  . ondansetron (ZOFRAN) 4 MG tablet Take 4 mg by mouth  every 8 (eight) hours as needed for nausea or vomiting.    Marland Kitchen PROCTOZONE-HC 2.5 % rectal cream     . QUEtiapine (SEROQUEL) 50 MG tablet Take 1 and 1/2 tablets at bedtime for 7 days and then increase to 2 tablets at bedtime. (Patient taking differently: Take 100 mg by mouth at bedtime. ) 60 tablet 4  . sennosides-docusate sodium (SENOKOT-S) 8.6-50 MG tablet Take 1 tablet by mouth daily as needed for constipation.    . simethicone (MYLICON) 983 MG chewable tablet Chew 125 mg by mouth every 6 (six) hours as needed for flatulence.    . VOLTAREN 1 % GEL APPLY TO AFFECTED AREA THREE TIMES DAILY 100 g 1   No current facility-administered medications for this visit.    Medical Decision Making:  Established Problem, Stable/Improving (1)  Treatment Plan Summary:Medication management During the hospitalization discussed above her Judson Roch "was discontinued as well as Prozac appropriately. She was discharged on Zyprexa 20 mg daily and clonazepam 1 mg twice a day. She was also discharged on carbamazepine 300 mg twice a day. I will order all of these medications today. Have spoken with sister to make sure there is some weight someone can get the medications from my order. I am going to follow-up with patient in 7-10 days. I also am going try to try to have social work meet with patient and sister to assess for additional services as it appears medication noncompliance has started to become an issue at the home.  Faith Rogue 04/12/2015, 9:11 AM

## 2015-04-16 ENCOUNTER — Emergency Department: Payer: Medicare Other

## 2015-04-16 ENCOUNTER — Encounter: Payer: Self-pay | Admitting: Emergency Medicine

## 2015-04-16 ENCOUNTER — Emergency Department
Admission: EM | Admit: 2015-04-16 | Discharge: 2015-04-16 | Disposition: A | Discharge status: Home / Self Care | Payer: Medicare Other | Attending: Emergency Medicine | Admitting: Emergency Medicine

## 2015-04-16 ENCOUNTER — Telehealth: Payer: Self-pay

## 2015-04-16 ENCOUNTER — Telehealth: Payer: Self-pay | Admitting: Family Medicine

## 2015-04-16 DIAGNOSIS — S0181XA Laceration without foreign body of other part of head, initial encounter: Secondary | ICD-10-CM

## 2015-04-16 DIAGNOSIS — Y998 Other external cause status: Secondary | ICD-10-CM | POA: Diagnosis not present

## 2015-04-16 DIAGNOSIS — N183 Chronic kidney disease, stage 3 (moderate): Secondary | ICD-10-CM | POA: Insufficient documentation

## 2015-04-16 DIAGNOSIS — Z79899 Other long term (current) drug therapy: Secondary | ICD-10-CM | POA: Diagnosis not present

## 2015-04-16 DIAGNOSIS — S0990XA Unspecified injury of head, initial encounter: Secondary | ICD-10-CM | POA: Diagnosis not present

## 2015-04-16 DIAGNOSIS — W010XXA Fall on same level from slipping, tripping and stumbling without subsequent striking against object, initial encounter: Secondary | ICD-10-CM | POA: Diagnosis not present

## 2015-04-16 DIAGNOSIS — Y9389 Activity, other specified: Secondary | ICD-10-CM | POA: Diagnosis not present

## 2015-04-16 DIAGNOSIS — Z7951 Long term (current) use of inhaled steroids: Secondary | ICD-10-CM | POA: Insufficient documentation

## 2015-04-16 DIAGNOSIS — I129 Hypertensive chronic kidney disease with stage 1 through stage 4 chronic kidney disease, or unspecified chronic kidney disease: Secondary | ICD-10-CM | POA: Diagnosis not present

## 2015-04-16 DIAGNOSIS — Y9289 Other specified places as the place of occurrence of the external cause: Secondary | ICD-10-CM | POA: Insufficient documentation

## 2015-04-16 DIAGNOSIS — Z880 Allergy status to penicillin: Secondary | ICD-10-CM | POA: Diagnosis not present

## 2015-04-16 DIAGNOSIS — S0121XA Laceration without foreign body of nose, initial encounter: Secondary | ICD-10-CM | POA: Diagnosis not present

## 2015-04-16 MED ORDER — LIDOCAINE HCL (PF) 1 % IJ SOLN: 2.0000 mL | Freq: Once | INTRAMUSCULAR | Status: DC

## 2015-04-16 MED ORDER — LIDOCAINE HCL (PF) 1 % IJ SOLN
INTRAMUSCULAR | Status: AC
  Filled 2015-04-16: qty 5

## 2015-04-16 NOTE — ED Notes (Signed)
Tripped walking her dog and hit bridge of nose. Also reports back pain.  Took tylenol around 5pm

## 2015-04-16 NOTE — Telephone Encounter (Signed)
[facility]    --------------------------------------------------------------------------------   Patient Name: Gina [name]  Gender: Female  DOB: [birth year]   Age: 64 Y 10 M 1 D  Return Phone Number: [phone] (Primary)  Address:     City/State/Zip:  [place]     Client [facility] - Client  Client Site [place] - Day  Physician Ria Bush   Contact Type Call  Call Type Triage / Clinical  Relationship To Patient Self  Return Phone Number [phone] (Primary)  Chief Complaint Nose Injury  Initial Comment Caller says she was just walking her dog around the house and she slipped and fell on her face. She has a cut on her nose, pain on her shoulder blades. Feels completely normal   PreDisposition Home Care       Nurse Assessment  Nurse: Amalia Hailey, RN, Melissa Date/Time (Eastern Time): 04/16/2015 5:05:07 PM  Confirm and document reason for call. If symptomatic, describe symptoms. ---Caller says she was just walking her dog around the house and she slipped and fell on her face. She has a cut on her nose, pain on her shoulder blades. Feels completely normal    Has the patient traveled out of the country within the last 30 days? ---Not Applicable    Does the patient require triage? ---Yes    Related visit to physician within the last 2 weeks? ---N/A    Does the PT have any chronic conditions? (i.e. diabetes, asthma, etc.) ---Yes    List chronic conditions. ---hypertension, just got out of the hospital "Manic", Bipolar Disorder,           Guidelines          Guideline Title Affirmed Question Affirmed Notes Nurse Date/Time (Eastern Time)  Nose Injury Skin is split open or gaping (or length > 1/4 inch or 6 mm)    Evans, RN, Melissa 04/16/2015 5:08:17 PM    Disp. Time Eilene Ghazi Time) Disposition Final User    04/16/2015 4:49:24 PM  Attempt made - no message left   Amalia Hailey, RN, Lenna Sciara      04/16/2015 5:16:41 PM Go to ED Now Yes Amalia Hailey, RN, Lenna Sciara            Caller Understands: Yes  Disagree/Comply: Comply       Care Advice Given Per Guideline        GO TO ED NOW: You need to be seen in the Emergency Department. Go to the ER at ___________ [place] [facility] now. Drive carefully. DRESSING: Cover with a sterile gauze or clean cloth until seen. CLEANSING LACERATIONS: * Wash the wound briefly with soap and water before being seen. NOSEBLEED: * Placing your thumb and index finger over each side of the soft lower portion of the nose, firmly pinch the nostrils together. Pinch the nostrils together for 10-15 minutes.    After Care Instructions Given        Call Event Type User Date / Time Description        --------------------------------------------------------------------------------         Comments  User: Caryl Comes Date/Time Eilene Ghazi Time): 04/16/2015 4:54:53 PM  Listened to recording, number should be [phone]    Referrals  [facility] - ED

## 2015-04-16 NOTE — ED Provider Notes (Signed)
[facility] Emergency Department Provider Note  ____________________________________________  Time seen: 8:40 PM  I have reviewed the triage vital signs and the nursing notes.   HISTORY  Chief Complaint Fall      HPI Gina [name] is a 64 y.o. female presents with history of accidental slip and fall with occipital scalp hitting the floor. Patient states events subsequent to sit up again We fell once more striking her occiput again. Patient states that she did not lose consciousness during either episode. Patient denies any weakness numbness or change in vision. Patient does state that she sustained a blow to her nasal bridge resulting a laceration.     Past Medical History  Diagnosis Date  . Bipolar depression     sees psychiatrist - psych admission 03/2015  . DJD (degenerative joint disease), lumbar     chronic lower back pain  . Obesity (BMI 30-39.9)   . History of colon cancer     s/p surgery  . 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 OSA on CPAP     6cm H2O  . RBBB   . Anemia in chronic kidney disease   . Blood transfusion without reported diagnosis 2009  . Cataract     left  . GERD (gastroesophageal reflux disease)   . Hyperlipidemia   . Hypertension   . History of bladder cancer 1997  . History of uterine cancer     s/p hysterectomy  . CKD (chronic kidney disease) stage 3, GFR 30-59 ml/min   . Personal history of colonic adenomas and colon cancer 11/11/2012  . IDA (iron deficiency anemia) 01/2013    thought due to h/o polyps  . Positive hepatitis C antibody test 09/2014    but negative confirmatory testing  . Colon cancer 1990's  . Family history of breast cancer   . Family history of colon cancer   . Family history of stomach cancer     Patient Active Problem List   Diagnosis Date Noted  . Essential (primary) hypertension 04/12/2015  . Acute on chronic kidney failure 03/30/2015  . Calculus  of gallbladder 03/28/2015  . NASH (nonalcoholic steatohepatitis) 03/28/2015  . Gout 03/27/2015  . Chronic kidney disease (CKD), stage III (moderate) 03/27/2015  . HCV antibody positive 03/27/2015  . History of neoplasm of bladder 03/27/2015  . History of prolonged Q-T interval on ECG 03/27/2015  . Elevated fasting blood sugar 03/27/2015  . Colonic constipation 03/27/2015  . Elevation of level of transaminase or lactic acid dehydrogenase (LDH) 03/27/2015  . General psychoses 03/26/2015  . Bipolar disorder 03/23/2015  . Genetic testing 03/21/2015  . Family history of breast cancer   . Family history of colon cancer   . Family history of stomach cancer   . History of anemia 11/27/2014  . Bipolar affective disorder, current episode manic 11/27/2014  . Bladder cancer 11/27/2014  . History of urinary anomaly 11/27/2014  . Uterine cancer 11/27/2014  . Allergic rhinitis 11/03/2014  . Contact dermatitis 11/03/2014  . Advanced care planning/counseling discussion 10/11/2014  . Memory deficit 09/13/2014  . Positive hepatitis C antibody test 09/04/2014  . Abnormal mental state 09/04/2014  . Amnesia 09/04/2014  . Thrombocytopenia 08/12/2014  . Prediabetes 06/07/2014  . HTN (hypertension) 06/07/2014  . Trouble in sleeping 05/10/2014  . Right knee pain 03/16/2014  . Sleep apnea 02/13/2014  . Hyperlipidemia   . IDA (iron deficiency anemia) 01/26/2013  . History of colonic polyps 11/11/2012  .  Recurrent falls 10/02/2012  . Nasal congestion 10/02/2012  . Medicare annual wellness visit, subsequent 08/27/2012  . Urine incontinence 08/27/2012  . Therapeutic drug monitoring 08/16/2012  . Recurrent ventral hernia 08/11/2012  . OSA on CPAP   . Intertrigo 05/27/2012  . CKD (chronic kidney disease) stage 3, GFR 30-59 ml/min   . Obesity (BMI 30-39.9)   . Colon cancer 04/07/2012  . 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 Bipolar 1 disorder 04/07/2012  . H/O malignant neoplasm of rectum,  rectosigmoid junction, and anus 08/05/2011    Past Surgical History  Procedure Laterality Date  . Hernia repair  02/04/12  . Partial colectomy  about 2008    for colon cancer  . I & d abdominal wound  02/19/12  . Removal of infected mesh and abdominal wound vac placement  02/24/12  . Reexploration of abdominal wound and allograft placemet  02/26/12  . Partial hysterectomy  1981    uterine cancer, R ovary remains  . Left oophorectomy  2005  . Colonoscopy  07/2009  . Dexa  12/2009    WNL  . Dobutamine stress echo  12/2009    no evidence of ischemia  . Colonoscopy  11/2012    2 tubular adenomas, mild diverticulosis, pending genetic testing for Lynch syndrome [name]) rpt 2 yrs  . Sleep study  02/2014    OSA - AHI 55, nadir 81% [name])  . Breast biopsy Right 01/2014    fibroadenoma  . Colonoscopy  02/2015    TA, diverticulosis, rpt 2 yrs [name])    Current Outpatient Rx  Name  Route  Sig  Dispense  Refill  . allopurinol (ZYLOPRIM) 100 MG tablet   Oral   Take 1 tablet (100 mg total) by mouth daily.   30 tablet   2   . amLODipine (NORVASC) 10 MG tablet   Oral   Take 10 mg by mouth.         Marland Kitchen amLODipine (NORVASC) 10 MG tablet               . azelastine (ASTELIN) 0.1 % nasal spray   Each Nare   Place 1 spray into both nostrils 2 (two) times daily. Use in each nostril as directed   30 mL   12   . carbamazepine (TEGRETOL XR) 100 MG 12 hr tablet   Oral   Take 3 tablets (300 mg total) by mouth 2 (two) times daily.   180 tablet   1   . Cholecalciferol (VITAMIN D3) 400 UNITS CAPS   Oral   Take 400 Units by mouth daily.         . clonazePAM (KLONOPIN) 1 MG tablet               . clonazePAM (KLONOPIN) 1 MG tablet   Oral   Take 1 tablet (1 mg total) by mouth 2 (two) times daily.   60 tablet   1   . diclofenac (VOLTAREN) 0.1 % ophthalmic solution      1 drop.         . diclofenac (VOLTAREN) 0.1 % ophthalmic solution               . dicyclomine  (BENTYL) 20 MG tablet   Oral   Take 20 mg by mouth every 6 (six) hours as needed for spasms.         Marland Kitchen FLUoxetine (PROZAC) 20 MG capsule   Oral   Take 20 mg by mouth daily.         Marland Kitchen  fluticasone (FLONASE) 50 MCG/ACT nasal spray   Each Nare   Place 2 sprays into both nostrils daily.   16 g   6   . hydrocortisone (ANUSOL-HC) 2.5 % rectal cream   Rectal   Place rectally.         . hydrOXYzine (ATARAX/VISTARIL) 50 MG tablet   Oral   Take 1 tablet (50 mg total) by mouth at bedtime as needed (insomnia).   30 tablet   6   . indomethacin (INDOCIN) 50 MG capsule   Oral   Take 50 mg by mouth 2 (two) times daily with a meal.         . lovastatin (MEVACOR) 40 MG tablet   Oral   Take 1 tablet (40 mg total) by mouth at bedtime.   90 tablet   3   . metoprolol tartrate (LOPRESSOR) 25 MG tablet   Oral   Take 1.5 tablets (37.5 mg total) by mouth 2 (two) times daily.   270 tablet   3   . nystatin ointment (MYCOSTATIN)   Topical   Apply 1 application topically 2 (two) times daily.   60 g   1     Cancel cream Rx   . OLANZapine (ZYPREXA) 20 MG tablet   Oral   Take 1 tablet (20 mg total) by mouth every morning.   30 tablet   1   . ondansetron (ZOFRAN) 4 MG tablet   Oral   Take 4 mg by mouth every 8 (eight) hours as needed for nausea or vomiting.         Marland Kitchen PROCTOZONE-HC 2.5 % rectal cream                 Dispense as written.   Marland Kitchen QUEtiapine (SEROQUEL) 50 MG tablet      Take 1 and 1/2 tablets at bedtime for 7 days and then increase to 2 tablets at bedtime. Patient taking differently: Take 100 mg by mouth at bedtime.    60 tablet   4   . sennosides-docusate sodium (SENOKOT-S) 8.6-50 MG tablet   Oral   Take 1 tablet by mouth daily as needed for constipation.         . simethicone (MYLICON) 283 MG chewable tablet   Oral   Chew 125 mg by mouth every 6 (six) hours as needed for flatulence.         . VOLTAREN 1 % GEL      APPLY TO AFFECTED AREA THREE  TIMES DAILY   100 g   1     Allergies Risperidone and related; Penicillins; Risperdal; Ivp dye; Tetanus toxoids; and Zetia  Family History  Problem Relation Age of Onset  . Colon cancer Mother 34  . Stomach cancer Mother     dx in her 12s?  Marland Kitchen Colon cancer Sister 47    Maternal half sister  . CAD Father     MI  . Diabetes Other     aunts and uncles both sides  . Arthritis Other     strong fmhx  . Mental illness Sister     anxiety/depression  . Breast cancer Maternal Grandmother   . Esophageal cancer Neg Hx   . Rectal cancer Neg Hx     Social History Social History  Substance Use Topics  . Smoking status: Never Smoker   . Smokeless tobacco: Never Used  . Alcohol Use: No    Review of Systems  Constitutional: Negative for fever. Eyes: Negative for  visual changes. ENT: Negative for sore throat. Cardiovascular: Negative for chest pain. Respiratory: Negative for shortness of breath. Gastrointestinal: Negative for abdominal pain, vomiting and diarrhea. Genitourinary: Negative for dysuria. Musculoskeletal: Negative for back pain. Skin: Positive for Nasal bridge laceration Neurological: Negative for headaches, focal weakness or numbness.   10-point ROS otherwise negative.  ____________________________________________   PHYSICAL EXAM:  VITAL SIGNS: ED Triage Vitals  Enc Vitals Group     BP 04/16/15 1811 149/76 mmHg     Pulse Rate 04/16/15 1811 69     Resp 04/16/15 1811 16     Temp 04/16/15 1811 98 F (36.7 C)     Temp Source 04/16/15 1811 Oral     SpO2 04/16/15 1811 97 %     Weight 04/16/15 1811 185 lb (83.915 kg)     Height 04/16/15 1811 5' 2"  (1.575 m)     Head Cir --      Peak Flow --      Pain Score 04/16/15 1816 5     Pain Loc --      Pain Edu? --      Excl. in [place]? --      Constitutional: Alert and oriented. Well appearing and in no distress. Eyes: Conjunctivae are normal. PERRL. Normal extraocular movements. ENT   Head: Normocephalic and  atraumatic.   Nose: No congestion/rhinnorhea.   Mouth/Throat: Mucous membranes are moist.   Neck: No stridor. Hematological/Lymphatic/Immunilogical: No cervical lymphadenopathy. Cardiovascular: Normal rate, regular rhythm. Normal and symmetric distal pulses are present in all extremities. No murmurs, rubs, or gallops. Respiratory: Normal respiratory effort without tachypnea nor retractions. Breath sounds are clear and equal bilaterally. No wheezes/rales/rhonchi. Gastrointestinal: Soft and nontender. No distention. There is no CVA tenderness. Genitourinary: deferred Musculoskeletal: Nontender with normal range of motion in all extremities. No joint effusions.  No lower extremity tenderness nor edema. Neurologic:  Normal speech and language. No gross focal neurologic deficits are appreciated. Speech is normal.  Skin:  2 cm linear laceration on the nasal bridge. Psychiatric: Mood and affect are normal. Speech and behavior are normal. Patient exhibits appropriate insight and judgment.    RADIOLOGY     CT Head Wo Contrast (Final result) Result time: 04/16/15 21:19:40   Final result by Rad Results In Interface (04/16/15 21:19:40)   Narrative:   CLINICAL DATA: 64 year old female tripped while walking dog hitting the bridge of nose. Initial encounter.  EXAM: CT HEAD WITHOUT CONTRAST  TECHNIQUE: Contiguous axial images were obtained from the base of the skull through the vertex without intravenous contrast.  COMPARISON: 04/08/2013 CT.  FINDINGS: No skull fracture or intracranial hemorrhage. No displaced nasal bone fracture identified.  Mild exophthalmos otherwise orbital structures appear intact.  Calcification dentate nucleus unchanged. Mineral deposition globus pallidus stable.  Mild atrophy without hydrocephalus.  No CT evidence of large acute infarct.  No intracranial mass lesion noted on this unenhanced exam.  IMPRESSION: No skull fracture or intracranial  hemorrhage. No displaced nasal bone fracture identified.   Electronically Signed By: Genia Del M.D. On: 04/16/2015 21:19     ____________________________________________   PROCEDURES  Procedure(s) performed: LACERATION REPAIR Performed by: Gregor Hams Authorized by: Gregor Hams Consent: Verbal consent obtained. Risks and benefits: risks, benefits and alternatives were discussed Consent given by: patient Patient identity confirmed: provided demographic data Prepped and Draped in normal sterile fashion Wound explored  Laceration Location: Nasal bridge  Laceration Length: 2 cm   No Foreign Bodies seen or palpated  Anesthesia: local infiltration  Local anesthetic: lidocaine 1 %  Anesthetic total: 40m  Irrigation method: syringe Amount of cleaning: standard  Skin closure: 6-0 nylon   Number of sutures: 4   Technique: Simple Interrupted  Patient tolerance: Patient tolerated the procedure well with no immediate complications.    INITIAL IMPRESSION / ASSESSMENT AND PLAN / ED COURSE  Pertinent labs & imaging results that were available during my care of the patient were reviewed by me and considered in my medical decision making (see chart for details).    ____________________________________________   FINAL CLINICAL IMPRESSION(S) / ED DIAGNOSES  Final diagnoses:  Laceration of face, initial encounter      RGregor Hams MD 04/16/15 2204

## 2015-04-16 NOTE — Discharge Instructions (Signed)
Facial Laceration ° A facial laceration is a cut on the face. These injuries can be painful and cause bleeding. Lacerations usually heal quickly, but they need special care to reduce scarring. °DIAGNOSIS  °Your health care provider will take a medical history, ask for details about how the injury occurred, and examine the wound to determine how deep the cut is. °TREATMENT  °Some facial lacerations may not require closure. Others may not be able to be closed because of an increased risk of infection. The risk of infection and the chance for successful closure will depend on various factors, including the amount of time since the injury occurred. °The wound may be cleaned to help prevent infection. If closure is appropriate, pain medicines may be given if needed. Your health care provider will use stitches (sutures), wound glue (adhesive), or skin adhesive strips to repair the laceration. These tools bring the skin edges together to allow for faster healing and a better cosmetic outcome. If needed, you may also be given a tetanus shot. °HOME CARE INSTRUCTIONS °· Only take over-the-counter or prescription medicines as directed by your health care provider. °· Follow your health care provider's instructions for wound care. These instructions will vary depending on the technique used for closing the wound. °For Sutures: °· Keep the wound clean and dry.   °· If you were given a bandage (dressing), you should change it at least once a day. Also change the dressing if it becomes wet or dirty, or as directed by your health care provider.   °· Wash the wound with soap and water 2 times a day. Rinse the wound off with water to remove all soap. Pat the wound dry with a clean towel.   °· After cleaning, apply a thin layer of the antibiotic ointment recommended by your health care provider. This will help prevent infection and keep the dressing from sticking.   °· You may shower as usual after the first 24 hours. Do not soak the  wound in water until the sutures are removed.   °· Get your sutures removed as directed by your health care provider. With facial lacerations, sutures should usually be taken out after 4-5 days to avoid stitch marks.   °· Wait a few days after your sutures are removed before applying any makeup. °For Skin Adhesive Strips: °· Keep the wound clean and dry.   °· Do not get the skin adhesive strips wet. You may bathe carefully, using caution to keep the wound dry.   °· If the wound gets wet, pat it dry with a clean towel.   °· Skin adhesive strips will fall off on their own. You may trim the strips as the wound heals. Do not remove skin adhesive strips that are still stuck to the wound. They will fall off in time.   °For Wound Adhesive: °· You may briefly wet your wound in the shower or bath. Do not soak or scrub the wound. Do not swim. Avoid periods of heavy sweating until the skin adhesive has fallen off on its own. After showering or bathing, gently pat the wound dry with a clean towel.   °· Do not apply liquid medicine, cream medicine, ointment medicine, or makeup to your wound while the skin adhesive is in place. This may loosen the film before your wound is healed.   °· If a dressing is placed over the wound, be careful not to apply tape directly over the skin adhesive. This may cause the adhesive to be pulled off before the wound is healed.   °· Avoid   prolonged exposure to sunlight or tanning lamps while the skin adhesive is in place.  The skin adhesive will usually remain in place for 5-10 days, then naturally fall off the skin. Do not pick at the adhesive film.  After Healing: Once the wound has healed, cover the wound with sunscreen during the day for 1 full year. This can help minimize scarring. Exposure to ultraviolet light in the first year will darken the scar. It can take 1-2 years for the scar to lose its redness and to heal completely.  SEEK IMMEDIATE MEDICAL CARE IF:  You have redness, pain, or  swelling around the wound.   You see ayellowish-white fluid (pus) coming from the wound.   You have chills or a fever.  MAKE SURE YOU:  Understand these instructions.  Will watch your condition.  Will get help right away if you are not doing well or get worse. Document Released: 08/28/2004 Document Revised: 05/11/2013 Document Reviewed: 03/03/2013 [facility] Patient Information 2015 [employer] [place], [place]. This information is not intended to replace advice given to you by your health care provider. Make sure you discuss any questions you have with your health care provider. Please have sutures removed in 7 days

## 2015-04-16 NOTE — Telephone Encounter (Signed)
pt sister called states that she does not know what going with her sister but she not acting right. she states her sister speech is off, and that she weak, sleeping alot, and that she even fell a couple of time.  she doesn't know if patient is taking medications correctly.   Pt has appt on  04-19-15

## 2015-04-17 ENCOUNTER — Telehealth: Payer: Self-pay | Admitting: Family Medicine

## 2015-04-17 ENCOUNTER — Telehealth: Payer: Self-pay

## 2015-04-17 ENCOUNTER — Ambulatory Visit: Payer: Self-pay | Admitting: Internal Medicine

## 2015-04-17 MED ORDER — OLANZAPINE 15 MG PO TABS: 15.0000 mg | ORAL_TABLET | Freq: Every morning | ORAL | Status: DC

## 2015-04-17 MED ORDER — CLONAZEPAM 0.5 MG PO TABS: 0.5000 mg | ORAL_TABLET | Freq: Two times a day (BID) | ORAL | Status: DC

## 2015-04-17 NOTE — Telephone Encounter (Signed)
Pt seen [facility] ED on 04/16/15.

## 2015-04-17 NOTE — Telephone Encounter (Signed)
faxed over the rx - confirmed

## 2015-04-17 NOTE — Telephone Encounter (Signed)
Abigail Butts RN with [facility] said due to pts age disposition noted should be seen within the hour;pt has appt to see Webb Silversmith NP at 3 pm today; Abigail Butts said pt not having symptoms today; is it OK to wait for 3 pm appt? If OK to wait until 3 pm appt no need to contact pt but if needs to be seen before 3 will need to notify pt.

## 2015-04-17 NOTE — Telephone Encounter (Signed)
Will handle this. [facility] referral made for sister at her last visit. I would think one will be made for this patient as well.

## 2015-04-17 NOTE — Telephone Encounter (Signed)
[name] at [facility] concerned; pt was just discharged from mental health facility; pt is very confused about her medications. Pt cannot tell [name] what meds she is taking and [name] knows that pt has been falling. Pts sister cannot read. [name] is concerned if pt should be staying on her own. [name] feels that pt is overly medicated. Pt is to bring all meds with her to 04/20/15 appt and [name] request cb from [name] after 04/20/15 appt with list of med s pt is supposed to be taking so [name] or [name] can assist fixing med boxes for pt.

## 2015-04-17 NOTE — Telephone Encounter (Signed)
called pt told pt her appt date and time. also let pt know that rx was sent in and that it should be delivered today.

## 2015-04-17 NOTE — Telephone Encounter (Signed)
[facility] Patient Name: Gina [name] A DOB: 07-11-1951 Initial Comment Caller states, is afraid to take her Rx, because, it makes her pass out -- please see note about her about a previous call today for this patient under a dif client -- Nurse Assessment Nurse: Markus Daft, RN, Sherre Poot Date/Time (Eastern Time): 04/17/2015 11:42:56 AM Confirm and document reason for call. If symptomatic, describe symptoms. ---Caller states she is afraid to take her prescribed meds including one for HTN - Amlodipine Besylate 10 mg one daily (was taking 1 & 1/2 tabs daily til spoke to pharmacist about meds on Friday), because "it makes her almost pass out" and sees that s.e. are listed as dizzy spells for many of the meds. She is also on Clonazepam and has not taken it today. Last night her sister told her that when caller went to bathroom that she almost fell again in the tub, caller doesn't recall it. -- She went to ER after fall incident yesterday d/t slipping while walking the dog. Did not pass out. -- Having dizzy spells that started after "taking meds away from her recently." Has the patient traveled out of the country within the last 30 days? ---Not Applicable Does the patient require triage? ---Yes Related visit to physician within the last 2 weeks? ---No Does the PT have any chronic conditions? (i.e. diabetes, asthma, etc.) ---Yes List chronic conditions. ---Gout, HTN, Anxiety (recently seen at hospital for psych care/ manic). Bipolar disorder Guidelines Guideline Title Affirmed Question Affirmed Notes Fainting [1] Age > 50 years AND [2] now alert and feels fine pt having flight of ideas going from one subject to another while trying to collect info. about syncopal episodes. Had CT scan done in ER that was ok yesterday. Final Disposition User Go to ED Now (or PCP triage) Markus Daft, RN,  [place]indy Comments Her sister called this morning, under [name] line, call ID [phone] Appt made with NP Baity at 3 pm. She will wait to take her meds til reviewed with MD, and BP can be taken. - RN will also contact office to see if MD would want her seen within the hour instead of at 3 pm. Caller aware. -----RN spoke with Rose to have this msg brought to MD now. Referrals REFERRED TO PCP OFFICE COMPLY.

## 2015-04-17 NOTE — Telephone Encounter (Signed)
Spoke with patient this morning. She was more focused and attentive than at her past appointment. I explained to her that because she was saying she was getting dizzy and having the falls that we would decrease her Zyprexa and Klonopin. She was aware she needs to replace her current supply of these with the new supply which the pharmacy is agreed they will deliver today. He will now take Zyprexa 15 mg as opposed to 20 mg at bedtime. She will take the Klonopin 0.5 mg twice a day as opposed to 1 mg twice a day. This Probation officer on 04/19/2015. He was aware of and of the upcoming appointment.

## 2015-04-19 ENCOUNTER — Ambulatory Visit (INDEPENDENT_AMBULATORY_CARE_PROVIDER_SITE_OTHER): Payer: Medicare Other | Admitting: Psychiatry

## 2015-04-19 ENCOUNTER — Encounter: Payer: Self-pay | Admitting: Psychiatry

## 2015-04-19 ENCOUNTER — Ambulatory Visit (INDEPENDENT_AMBULATORY_CARE_PROVIDER_SITE_OTHER): Payer: Medicare Other | Admitting: Licensed Clinical Social Worker

## 2015-04-19 VITALS — BP 142/82 | HR 92 | Temp 98.2°F | Ht 62.0 in | Wt 194.4 lb

## 2015-04-19 DIAGNOSIS — F3170 Bipolar disorder, currently in remission, most recent episode unspecified: Secondary | ICD-10-CM | POA: Diagnosis not present

## 2015-04-19 DIAGNOSIS — F319 Bipolar disorder, unspecified: Secondary | ICD-10-CM

## 2015-04-19 NOTE — Progress Notes (Signed)
Patient:   Gina [name]   DOB:   Jun 28, 1951  MR Number:  [id number]  Location:  [facility] [address] [place] [address] Dept: [phone]           Date of Service:   04/19/2015  Start Time:   9a End Time:   10a  Provider/Observer:  Lubertha South Counselor       Billing Code/Service: [phone]  Behavioral Observation: [name]  presents as a 64 y.o.-year-old Caucasian Female who appeared her stated age. her dress was Appropriate and she was Casual and her manners were Appropriate to the situation.  There were any physical disabilities noted.  she displayed an appropriate level of cooperation and motivation.    Interactions:    Active   Attention:   abnormal  Memory:   within normal limits  Speech (Volume):  normal  Speech:   normal volume  Thought Process:  Tangential  Though Content:  WNL  Orientation:   person, place, time/date and situation  Judgment:   Good  Planning:   Fair  Affect:    Congruent  Mood:    Good  Insight:   Good  Intelligence:   normal  Chief Complaint:     Chief Complaint  Patient presents with  . Manic Behavior  . Establish Care    Reason for Service:  "I want to get well. I don't mind taking medicine.  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     Medical History:   Past Medical History  Diagnosis Date  . Bipolar depression     sees psychiatrist - psych admission 03/2015  . DJD (degenerative joint disease), lumbar     chronic lower back pain  . Obesity (BMI 30-39.9)   . History of colon cancer     s/p surgery  . 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 OSA on CPAP     6cm H2O  . RBBB   . Anemia in chronic kidney disease   . Blood transfusion without reported diagnosis 2009  . Cataract     left  . GERD (gastroesophageal reflux disease)   . Hyperlipidemia   . Hypertension   . History of bladder cancer 1997  . History of uterine cancer     s/p hysterectomy  . CKD (chronic  kidney disease) stage 3, GFR 30-59 ml/min   . Personal history of colonic adenomas and colon cancer 11/11/2012  . IDA (iron deficiency anemia) 01/2013    thought due to h/o polyps  . Positive hepatitis C antibody test 09/2014    but negative confirmatory testing  . Colon cancer 1990's  . Family history of breast cancer   . Family history of colon cancer   . Family history of stomach cancer           Medication List       This list is accurate as of: 04/19/15  9:03 AM.  Always use your most recent med list.               allopurinol 100 MG tablet  Commonly known as:  ZYLOPRIM  Take 1 tablet (100 mg total) by mouth daily.     amLODipine 10 MG tablet  Commonly known as:  NORVASC  Take 10 mg by mouth.     amLODipine 10 MG tablet  Commonly known as:  NORVASC     azelastine 0.1 % nasal spray  Commonly known as:  ASTELIN  Place 1 spray into both nostrils 2 (two) times daily. Use in each nostril as directed     carbamazepine 100 MG 12 hr tablet  Commonly known as:  TEGRETOL XR  Take 3  tablets (300 mg total) by mouth 2 (two) times daily.     clonazePAM 1 MG tablet  Commonly known as:  KLONOPIN     clonazePAM 0.5 MG tablet  Commonly known as:  KLONOPIN  Take 1 tablet (0.5 mg total) by mouth 2 (two) times daily.     diclofenac 0.1 % ophthalmic solution  Commonly known as:  VOLTAREN  1 drop.     diclofenac 0.1 % ophthalmic solution  Commonly known as:  VOLTAREN     dicyclomine 20 MG tablet  Commonly known as:  BENTYL  Take 20 mg by mouth every 6 (six) hours as needed for spasms.     FLUoxetine 20 MG capsule  Commonly known as:  PROZAC  Take 20 mg by mouth daily.     fluticasone 50 MCG/ACT nasal spray  Commonly known as:  FLONASE  Place 2 sprays into both nostrils daily.     hydrocortisone 2.5 % rectal cream  Commonly known as:  ANUSOL-HC  Place rectally.     PROCTOZONE-HC 2.5 % rectal cream  Generic drug:  hydrocortisone     hydrOXYzine 50 MG tablet  Commonly known as:  ATARAX/VISTARIL  Take 1 tablet (50 mg total) by mouth at bedtime as needed (insomnia).     indomethacin 50 MG capsule  Commonly known as:  INDOCIN  Take 50 mg by mouth 2 (two) times daily with a meal.     lovastatin 40 MG tablet  Commonly known as:  MEVACOR  Take 1 tablet (40 mg total) by mouth at bedtime.     metoprolol tartrate 25 MG tablet  Commonly known as:  LOPRESSOR  Take 1.5 tablets (37.5 mg total) by mouth 2 (two) times daily.     nystatin ointment  Commonly known as:  MYCOSTATIN  Apply 1 application topically 2 (two) times daily.     OLANZapine 15 MG tablet  Commonly known as:  ZYPREXA  Take 1 tablet (15 mg total) by mouth every morning.     ondansetron 4 MG tablet  Commonly known as:  ZOFRAN  Take 4 mg by mouth every 8 (eight) hours  as needed for nausea or vomiting.     QUEtiapine 50 MG tablet  Commonly known as:  SEROQUEL  Take 1 and 1/2 tablets at bedtime for 7 days and then increase to 2 tablets at bedtime.     sennosides-docusate sodium 8.6-50 MG tablet   Commonly known as:  SENOKOT-S  Take 1 tablet by mouth daily as needed for constipation.     simethicone 125 MG chewable tablet  Commonly known as:  MYLICON  Chew 381 mg by mouth every 6 (six) hours as needed for flatulence.     Vitamin D3 400 UNITS Caps  Take 400 Units by mouth daily.     VOLTAREN 1 % Gel  Generic drug:  diclofenac sodium  APPLY TO AFFECTED AREA THREE TIMES DAILY              Sexual History:   History  Sexual Activity  . Sexual Activity: No     Abuse/Trauma History: Denies    Psychiatric History:  Has an extensive history of MM   Strengths:   Talkative, praying, give money to [employer]. Jude    Recovery Goals:  "I want to get well. I don't mind taking medicine.  I would like to go to sleep without medicine."  Hobbies/Interests:               Word puzzles, reading bible, walk  Challenges/Barriers: MH symptoms,     Family Med/Psych History:  Family History  Problem Relation Age of Onset  . Colon cancer Mother 11  . Stomach cancer Mother     dx in her 29s?  Marland Kitchen Colon cancer Sister 14    Maternal half sister  . CAD Father     MI  . Diabetes Other     aunts and uncles both sides  . Arthritis Other     strong fmhx  . Mental illness Sister     anxiety/depression  . Breast cancer Maternal Grandmother   . Esophageal cancer Neg Hx   . Rectal cancer Neg Hx     Risk of Suicide/Violence: low   History of Suicide/Violence:  Denies  Psychosis:   Denies  Diagnosis:     Bipolar I  Impression/DX:  Dorita is currently diagnosed with Bipolar I Disorder due to her current symptoms.  Valor will be best supported by medication management and therapy to assist with coping skills and understanding her triggers.  Edit does not have a history of HI or SI.  She has minimal protective factors.  She has minimal relationships with others and denies psychosis.  Inola was recently hospitalized due to [facility] symptoms.  Writer is unsure of medication compliance  due to history and lack of knowledge of medication.    Lizza stated that she believes that she consumed too many pills and fell.  She recently went to the ER and received stitches.  She also has two black eyes.  Recommendation/Plan: Probation officer & Patient completed referrals to [facility] for CST.  Patient gave consent to refer to higher level of care and home health care for further evaluation.

## 2015-04-19 NOTE — Progress Notes (Signed)
[facility] MD/PA/NP OP Progress Note  04/19/2015 8:55 AM Gina [name]  MRN:  [id number]  Subjective:  Patient returns for follow-up for bipolar disorder. Patient returns a follow-up or bipolar disorder. The patient had presented at her last appointment on 04/12/2015 after discharge from a psychiatric admission at [facility]. At that time she was on the medication Zyprexa 20 mg daily and Klonopin 1 mg twice a day. It appears that she had some falls and thus writer was in touch with patient and her sister. On 6 #13 2016 I decreased her doses to Zyprexa 15 mg daily and Klonopin 0.5 millions twice daily. The patient states she has been taking the reduced dose of Zyprexa however she stated that there was a Post-it note on the Klonopin saying hold until you see this Probation officer. I did not give any such instructions, however perhaps pharmacy her sister put this note on the bottle.  He is certainly less manic this appointment as she had been off of medication for 4 days prior to the last one. However she still continues to have pressured speech and be hyperverbal. She can be easily redirected.  Been some time talking with her that I do think she needs services of some type. Her sister appears to have medical issues and perhaps is less involved in patient's care.   Chief Complaint:  Chief Complaint    Follow-up     Visit Diagnosis:   No diagnosis found.  Past Medical History:  Past Medical History  Diagnosis Date  . Bipolar depression     sees psychiatrist - psych admission 03/2015  . DJD (degenerative joint disease), lumbar     chronic lower back pain  . Obesity (BMI 30-39.9)   . History of colon cancer     s/p surgery  . 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 OSA on CPAP     6cm H2O  . RBBB   . Anemia in chronic kidney disease   . Blood transfusion without reported diagnosis 2009  . Cataract     left  . GERD (gastroesophageal reflux disease)   .  Hyperlipidemia   . Hypertension   . History of bladder cancer 1997  . History of uterine cancer     s/p hysterectomy  . CKD (chronic kidney disease) stage 3, GFR 30-59 ml/min   . Personal history of colonic adenomas and colon cancer 11/11/2012  . IDA (iron deficiency anemia) 01/2013    thought due to h/o polyps  . Positive hepatitis C antibody test 09/2014    but negative confirmatory testing  . Colon cancer 1990's  . Family history of breast cancer   . Family history of colon cancer   . Family history of stomach cancer     Past Surgical History  Procedure Laterality Date  . Hernia repair  02/04/12  . Partial colectomy  about 2008    for colon cancer  . I & d abdominal wound  02/19/12  . Removal of infected mesh and abdominal wound vac placement  02/24/12  . Reexploration of abdominal wound and allograft placemet  02/26/12  . Partial hysterectomy  1981    uterine cancer, R ovary remains  . Left oophorectomy  2005  . Colonoscopy  07/2009  . Dexa  12/2009    WNL  . Dobutamine stress echo  12/2009    no evidence of ischemia  . Colonoscopy  11/2012    2 tubular adenomas, mild diverticulosis, pending genetic  testing for Lynch syndrome [name]) rpt 2 yrs  . Sleep study  02/2014    OSA - [facility] 55, nadir 81% [name])  . Breast biopsy Right 01/2014    fibroadenoma  . Colonoscopy  02/2015    TA, diverticulosis, rpt 2 yrs [name])   Family History:  Family History  Problem Relation Age of Onset  . Colon cancer Mother 77  . Stomach cancer Mother     dx in her 75s?  Marland Kitchen Colon cancer Sister 66    Maternal half sister  . CAD Father     MI  . Diabetes Other     aunts and uncles both sides  . Arthritis Other     strong fmhx  . Mental illness Sister     anxiety/depression  . Breast cancer Maternal Grandmother   . Esophageal cancer Neg Hx   . Rectal cancer Neg Hx    Social History:  Social History   Social History  . Marital Status: Single    Spouse Name: N/A  . Number of Children: 0   . Years of Education: N/A   Social History Main Topics  . Smoking status: Never Smoker   . Smokeless tobacco: Never Used  . Alcohol Use: No  . Drug Use: No  . Sexual Activity: No   Other Topics Concern  . None   Social History Narrative   Lives with sister, no pets   Occupation: disabled, for bipolar and arthritis   Edu: GED   Activity: take walks   Diet: good water, vegetables daily      Psych - [place], Dr. Jimmye Norman (ph [phone])   Additional History:   Assessment:   Musculoskeletal: Strength & Muscle Tone: within normal limits Gait & Station: Patient has normal gait but ambulates slowly Patient leans: N/A  Psychiatric Specialty Exam: HPI  Review of Systems  Psychiatric/Behavioral: Negative for depression, suicidal ideas, hallucinations, memory loss and substance abuse. The patient is not nervous/anxious and does not have insomnia.     There were no vitals taken for this visit.There is no weight on file to calculate BMI.  General Appearance: Well Groomed  Eye Contact:  Good  Speech:  Still pressured but less so than the last appointment  Volume:  Normal  Mood:  Good  Affect:  Congruent  Thought Process:  Circumstantial and Disorganized  Orientation:  Full (Time, Place, and Person)  Thought Content:  Negative  Suicidal Thoughts:  No  Homicidal Thoughts:  No  Memory:  Immediate;   Good Recent;   Good Remote;   Fair  Judgement:  Good  Insight:  Good  Psychomotor Activity:  Negative  Concentration:  Good  Recall:  [place] of Knowledge: Fair  Language: Good  Akathisia:  Negative  Handed:  Right unknown  AIMS (if indicated):  Normal- Done on 12/12/14 in [employer]  Assets:  Desire for Improvement  ADL's:  Intact  Cognition: WNL  Sleep:  Good   Is the patient at risk to self?  No. Has the patient been a risk to self in the past 6 months?  No. Has the patient been a risk to self within the distant past?  No. Is the patient a risk to  others?  No. Has the patient been a risk to others in the past 6 months?  No. Has the patient been a risk to others within the distant past?  No.  Current Medications: Current Outpatient Prescriptions  Medication Sig Dispense  Refill  . allopurinol (ZYLOPRIM) 100 MG tablet Take 1 tablet (100 mg total) by mouth daily. 30 tablet 2  . amLODipine (NORVASC) 10 MG tablet Take 10 mg by mouth.    Marland Kitchen amLODipine (NORVASC) 10 MG tablet     . azelastine (ASTELIN) 0.1 % nasal spray Place 1 spray into both nostrils 2 (two) times daily. Use in each nostril as directed 30 mL 12  . carbamazepine (TEGRETOL XR) 100 MG 12 hr tablet Take 3 tablets (300 mg total) by mouth 2 (two) times daily. 180 tablet 1  . Cholecalciferol (VITAMIN D3) 400 UNITS CAPS Take 400 Units by mouth daily.    . clonazePAM (KLONOPIN) 0.5 MG tablet Take 1 tablet (0.5 mg total) by mouth 2 (two) times daily. 60 tablet 1  . clonazePAM (KLONOPIN) 1 MG tablet     . diclofenac (VOLTAREN) 0.1 % ophthalmic solution 1 drop.    . diclofenac (VOLTAREN) 0.1 % ophthalmic solution     . dicyclomine (BENTYL) 20 MG tablet Take 20 mg by mouth every 6 (six) hours as needed for spasms.    Marland Kitchen FLUoxetine (PROZAC) 20 MG capsule Take 20 mg by mouth daily.    . fluticasone (FLONASE) 50 MCG/ACT nasal spray Place 2 sprays into both nostrils daily. 16 g 6  . hydrocortisone (ANUSOL-HC) 2.5 % rectal cream Place rectally.    . hydrOXYzine (ATARAX/VISTARIL) 50 MG tablet Take 1 tablet (50 mg total) by mouth at bedtime as needed (insomnia). 30 tablet 6  . indomethacin (INDOCIN) 50 MG capsule Take 50 mg by mouth 2 (two) times daily with a meal.    . lovastatin (MEVACOR) 40 MG tablet Take 1 tablet (40 mg total) by mouth at bedtime. 90 tablet 3  . metoprolol tartrate (LOPRESSOR) 25 MG tablet Take 1.5 tablets (37.5 mg total) by mouth 2 (two) times daily. 270 tablet 3  . nystatin ointment (MYCOSTATIN) Apply 1 application topically 2 (two) times daily. 60 g 1  . OLANZapine  (ZYPREXA) 15 MG tablet Take 1 tablet (15 mg total) by mouth every morning. 30 tablet 1  . ondansetron (ZOFRAN) 4 MG tablet Take 4 mg by mouth every 8 (eight) hours as needed for nausea or vomiting.    Marland Kitchen PROCTOZONE-HC 2.5 % rectal cream     . QUEtiapine (SEROQUEL) 50 MG tablet Take 1 and 1/2 tablets at bedtime for 7 days and then increase to 2 tablets at bedtime. (Patient taking differently: Take 100 mg by mouth at bedtime. ) 60 tablet 4  . sennosides-docusate sodium (SENOKOT-S) 8.6-50 MG tablet Take 1 tablet by mouth daily as needed for constipation.    . simethicone (MYLICON) 973 MG chewable tablet Chew 125 mg by mouth every 6 (six) hours as needed for flatulence.    . VOLTAREN 1 % GEL APPLY TO AFFECTED AREA THREE TIMES DAILY 100 g 1   No current facility-administered medications for this visit.    Medical Decision Making:  Established Problem, Stable/Improving (1)  Treatment Plan Summary:Medication management Patient continues to be hyperverbal. However she is able to follow directions and presented that she was aware of her medications and doses. She knew the Zyprexa she takes in the morning and she knew the Klonopin she will take twice a day.  Bipolar disorder type I-continue Zyprexa 15 mg daily and Klonopin 0.5 mg twice daily. Patient will return in 2 weeks.  Social work has met with patient to assess for additional services in the home as it appears this is becoming  an issue and affecting compliance. Faith Rogue 04/19/2015, 8:55 AM

## 2015-04-20 ENCOUNTER — Encounter: Payer: Self-pay | Admitting: Family Medicine

## 2015-04-20 ENCOUNTER — Ambulatory Visit (INDEPENDENT_AMBULATORY_CARE_PROVIDER_SITE_OTHER): Payer: Medicare Other | Admitting: Family Medicine

## 2015-04-20 VITALS — BP 176/94 | HR 92 | Temp 98.3°F | Wt 192.5 lb

## 2015-04-20 DIAGNOSIS — I10 Essential (primary) hypertension: Secondary | ICD-10-CM | POA: Diagnosis not present

## 2015-04-20 DIAGNOSIS — Z23 Encounter for immunization: Secondary | ICD-10-CM | POA: Diagnosis not present

## 2015-04-20 DIAGNOSIS — F319 Bipolar disorder, unspecified: Secondary | ICD-10-CM

## 2015-04-20 DIAGNOSIS — R0981 Nasal congestion: Secondary | ICD-10-CM | POA: Diagnosis not present

## 2015-04-20 DIAGNOSIS — S0121XD Laceration without foreign body of nose, subsequent encounter: Secondary | ICD-10-CM | POA: Diagnosis not present

## 2015-04-20 MED ORDER — AMLODIPINE BESYLATE 10 MG PO TABS: 10.0000 mg | ORAL_TABLET | Freq: Every day | ORAL | Status: DC

## 2015-04-20 MED ORDER — METOPROLOL TARTRATE 25 MG PO TABS: 37.5000 mg | ORAL_TABLET | Freq: Two times a day (BID) | ORAL | Status: DC

## 2015-04-20 NOTE — Assessment & Plan Note (Signed)
S/p suturing 04/16/2015. Today is day 4 - wound is not ready for sutures to be removed. Returne on Monday for suture removal. Pt agrees.

## 2015-04-20 NOTE — Progress Notes (Signed)
BP 176/94 mmHg  Pulse 92  Temp(Src) 98.3 F (36.8 C) (Oral)  Wt 192 lb 8 oz (87.317 kg)   CC: [facility] ER f/u visit  Subjective:    Patient ID: Gina [name], female    DOB: October 05, 1950, 64 y.o.   MRN: [id number]  HPI: Gina [name] is a 64 y.o. female presenting on 04/20/2015 for Follow-up   Records reviewed. Recent visit to [facility] ER 04/16/2015 - tripped while walking her dog and slipped in grass, sustained occipital scalp contusion and nasal laceration with 4 6-0 nylon sutures. CT head without skull fracture or intracranial hemorrhage. No LOC.   Bipolar disorder I - saw Dr Jimmye Norman yesterday at [facility]. Rec home services - in process of setting this up. Continue zyprexa 15mg  daily and klonopin 0.5mg  bid. Not taking carbamazepin 300mg  twice daily. rec f/u with Dr Jimmye Norman in 2 wks. She states she is off prozac, seroquel and carbamazepine.   Hypertension - unsure if she took her medications this morning.   Relevant past medical, surgical, family and social history reviewed and updated as indicated. Interim medical history since our last visit reviewed. Allergies and medications reviewed and updated. Current Outpatient Prescriptions on File Prior to Visit  Medication Sig  . allopurinol (ZYLOPRIM) 100 MG tablet Take 1 tablet (100 mg total) by mouth daily.  Marland Kitchen azelastine (ASTELIN) 0.1 % nasal spray Place 1 spray into both nostrils 2 (two) times daily. Use in each nostril as directed  . clonazePAM (KLONOPIN) 0.5 MG tablet Take 1 tablet (0.5 mg total) by mouth 2 (two) times daily.  Marland Kitchen dicyclomine (BENTYL) 20 MG tablet Take 20 mg by mouth every 6 (six) hours as needed for spasms.  . fluticasone (FLONASE) 50 MCG/ACT nasal spray Place 2 sprays into both nostrils daily.  . indomethacin (INDOCIN) 50 MG capsule Take 50 mg by mouth 2 (two) times daily as needed (for gout flare).   Marland Kitchen lovastatin (MEVACOR) 40 MG tablet Take 1 tablet (40 mg total) by mouth at bedtime.  .  Cholecalciferol (VITAMIN D3) 400 UNITS CAPS Take 400 Units by mouth daily.  . diclofenac (VOLTAREN) 0.1 % ophthalmic solution 1 drop.  . hydrocortisone (ANUSOL-HC) 2.5 % rectal cream Place rectally.  . nystatin ointment (MYCOSTATIN) Apply 1 application topically 2 (two) times daily.  . ondansetron (ZOFRAN) 4 MG tablet Take 4 mg by mouth every 8 (eight) hours as needed for nausea or vomiting.  Marland Kitchen PROCTOZONE-HC 2.5 % rectal cream   . sennosides-docusate sodium (SENOKOT-S) 8.6-50 MG tablet Take 1 tablet by mouth daily as needed for constipation.  . simethicone (MYLICON) 494 MG chewable tablet Chew 125 mg by mouth every 6 (six) hours as needed for flatulence.  . VOLTAREN 1 % GEL APPLY TO AFFECTED AREA THREE TIMES DAILY   No current facility-administered medications on file prior to visit.    Review of Systems Per HPI unless specifically indicated above     Objective:    BP 176/94 mmHg  Pulse 92  Temp(Src) 98.3 F (36.8 C) (Oral)  Wt 192 lb 8 oz (87.317 kg)  Wt Readings from Last 3 Encounters:  04/20/15 192 lb 8 oz (87.317 kg)  04/19/15 194 lb 6.4 oz (88.179 kg)  04/16/15 185 lb (83.915 kg)    Physical Exam  Constitutional: She appears well-developed and well-nourished. No distress.  HENT:  Mouth/Throat: Oropharynx is clear and moist. No oropharyngeal exudate.  Laceration well approximated at nasal bridge with 4 sutures in place, however skin not fully  closed at wound site.  Cardiovascular: Normal rate, regular rhythm, normal heart sounds and intact distal pulses.   No murmur heard. Pulmonary/Chest: Effort normal and breath sounds normal. No respiratory distress. She has no wheezes. She has no rales.  Musculoskeletal: She exhibits no edema.  Skin: Skin is warm and dry. No rash noted.  Psychiatric: Judgment and thought content normal. Her speech is rapid and/or pressured and tangential. She is agitated and hyperactive.  Expansive affect Psychomotor agitation Stream of consciousness  thought process, difficult to follow at times  Nursing note and vitals reviewed.  Lab Results  Component Value Date   CREATININE 1.21* 03/26/2015      Assessment & Plan:   Problem List Items Addressed This Visit    Bipolar 1 disorder - Primary    Much more disorganized and restless than I have seen her before - anticipate due to poorly controlled bipolar disorder. She has just recently been started on zyprexa and endorses compliance with this regimen along with klonopin 0.5mg  bid. I have removed prozac, seroquel, carbamazepine as well as hydroxyzine from her medication list. Her sister has severe anxiety and already has difficulty managing her own medical and social conditions in addition to being illiterate. Will address Meilyn's possible need for higher level of care at next office visit. Overall a very difficult social situation. I agree with referral for home services and see that psych office has started this process. Will await to see what can be done from this standpoint. Today I have asked my CMA to fill next several days of pill box for patient. I will forward today's note to Dr Jimmye Norman to stay up to date.      Nasal congestion    Satisfied with effect of azelastine and flonase.      HTN (hypertension)    Markedly elevated today whereas previously well controlled.  Hesitant to make medication changes as pt is unsure if she is taking medications appropriately. I have emphasized importance of compliance with medication regimen and my CMA has filled pill box she brings for next several days until f/u appt.  BP Readings from Last 3 Encounters:  04/20/15 176/94  04/19/15 142/82  04/16/15 149/76        Relevant Medications   amLODipine (NORVASC) 10 MG tablet   metoprolol tartrate (LOPRESSOR) 25 MG tablet   Nasal laceration    S/p suturing 04/16/2015. Today is day 4 - wound is not ready for sutures to be removed. Returne on Monday for suture removal. Pt agrees.         Other Visit Diagnoses    Need for influenza vaccination        Relevant Orders    Flu Vaccine QUAD 36+ mos PF IM (Fluarix & Fluzone Quad PF) (Completed)        Follow up plan: Return in about 3 days (around 04/23/2015), or as needed, for follow up visit.

## 2015-04-20 NOTE — Assessment & Plan Note (Addendum)
Much more disorganized and restless than I have seen her before - anticipate due to poorly controlled bipolar disorder. She has just recently been started on zyprexa and endorses compliance with this regimen along with klonopin 0.64m bid. I have removed prozac, seroquel, carbamazepine as well as hydroxyzine from her medication list. Her sister has severe anxiety and already has difficulty managing her own medical and social conditions in addition to being illiterate. Will address Jalayla's possible need for higher level of care at next office visit. Overall a very difficult social situation. I agree with referral for home services and see that psych office has started this process. Will await to see what can be done from this standpoint. Today I have asked my CMA to fill next several days of pill box for patient. I will forward today's note to Dr WJimmye Normanto stay up to date.

## 2015-04-20 NOTE — Progress Notes (Signed)
Pre visit review using our clinic review tool, if applicable. No additional management support is needed unless otherwise documented below in the visit note. 

## 2015-04-20 NOTE — Assessment & Plan Note (Signed)
Markedly elevated today whereas previously well controlled.  Hesitant to make medication changes as pt is unsure if she is taking medications appropriately. I have emphasized importance of compliance with medication regimen and my CMA has filled pill box she brings for next several days until f/u appt.  BP Readings from Last 3 Encounters:  04/20/15 176/94  04/19/15 142/82  04/16/15 149/76

## 2015-04-20 NOTE — Assessment & Plan Note (Addendum)
Satisfied with effect of azelastine and flonase.

## 2015-04-20 NOTE — Patient Instructions (Addendum)
Look below for your new medicine regimen. We have disposed of seroquel, klonopin 37m, and carbamazepine. Make sure to take 2 blood pressure medicines daily and 2 psych medicines regularly. It is too soon to remove sutures from nose. Return on Monday for suture removal.

## 2015-04-23 ENCOUNTER — Ambulatory Visit: Payer: Medicare Other | Admitting: Family Medicine

## 2015-04-24 ENCOUNTER — Ambulatory Visit (INDEPENDENT_AMBULATORY_CARE_PROVIDER_SITE_OTHER): Payer: Medicare Other | Admitting: Family Medicine

## 2015-04-24 ENCOUNTER — Encounter: Payer: Self-pay | Admitting: Family Medicine

## 2015-04-24 VITALS — BP 138/86 | HR 68 | Temp 98.7°F | Wt 190.0 lb

## 2015-04-24 DIAGNOSIS — K5900 Constipation, unspecified: Secondary | ICD-10-CM

## 2015-04-24 DIAGNOSIS — K5909 Other constipation: Secondary | ICD-10-CM

## 2015-04-24 DIAGNOSIS — S0121XD Laceration without foreign body of nose, subsequent encounter: Secondary | ICD-10-CM | POA: Diagnosis not present

## 2015-04-24 NOTE — Progress Notes (Signed)
BP 138/86 mmHg  Pulse 68  Temp(Src) 98.7 F (37.1 C) (Oral)  Wt 190 lb (86.183 kg)   CC: remove sutures Subjective:    Patient ID: Gina [name], female    DOB: 10/14/1950, 64 y.o.   MRN: [id number]  HPI: Gina [name] is a 64 y.o. female presenting on 04/24/2015 for Suture / Staple Removal   See prior note for details. Nasal laceration s/p 4 sutures placed at ER last week 04/16/2015. When evaluated last week, was too early to remove. Today is day #8. Will remove stitches.   Relevant past medical, surgical, family and social history reviewed and updated as indicated. Interim medical history since our last visit reviewed. Allergies and medications reviewed and updated. Current Outpatient Prescriptions on File Prior to Visit  Medication Sig  . allopurinol (ZYLOPRIM) 100 MG tablet Take 1 tablet (100 mg total) by mouth daily.  Marland Kitchen amLODipine (NORVASC) 10 MG tablet Take 1 tablet (10 mg total) by mouth daily.  Marland Kitchen azelastine (ASTELIN) 0.1 % nasal spray Place 1 spray into both nostrils 2 (two) times daily. Use in each nostril as directed  . Cholecalciferol (VITAMIN D3) 400 UNITS CAPS Take 400 Units by mouth daily.  . clonazePAM (KLONOPIN) 0.5 MG tablet Take 1 tablet (0.5 mg total) by mouth 2 (two) times daily.  . diclofenac (VOLTAREN) 0.1 % ophthalmic solution 1 drop.  . dicyclomine (BENTYL) 20 MG tablet Take 20 mg by mouth every 6 (six) hours as needed for spasms.  . fluticasone (FLONASE) 50 MCG/ACT nasal spray Place 2 sprays into both nostrils daily.  . hydrocortisone (ANUSOL-HC) 2.5 % rectal cream Place rectally.  . indomethacin (INDOCIN) 50 MG capsule Take 50 mg by mouth 2 (two) times daily as needed (for gout flare).   Marland Kitchen lovastatin (MEVACOR) 40 MG tablet Take 1 tablet (40 mg total) by mouth at bedtime.  . metoprolol tartrate (LOPRESSOR) 25 MG tablet Take 1.5 tablets (37.5 mg total) by mouth 2 (two) times daily.  Marland Kitchen nystatin ointment (MYCOSTATIN) Apply 1 application topically  2 (two) times daily.  Marland Kitchen OLANZapine (ZYPREXA) 20 MG tablet Take 20 mg by mouth at bedtime.  . ondansetron (ZOFRAN) 4 MG tablet Take 4 mg by mouth every 8 (eight) hours as needed for nausea or vomiting.  Marland Kitchen PROCTOZONE-HC 2.5 % rectal cream   . sennosides-docusate sodium (SENOKOT-S) 8.6-50 MG tablet Take 1 tablet by mouth daily as needed for constipation.  . simethicone (MYLICON) 169 MG chewable tablet Chew 125 mg by mouth every 6 (six) hours as needed for flatulence.  . VOLTAREN 1 % GEL APPLY TO AFFECTED AREA THREE TIMES DAILY   No current facility-administered medications on file prior to visit.    Review of Systems Per HPI unless specifically indicated above     Objective:    BP 138/86 mmHg  Pulse 68  Temp(Src) 98.7 F (37.1 C) (Oral)  Wt 190 lb (86.183 kg)  Wt Readings from Last 3 Encounters:  04/24/15 190 lb (86.183 kg)  04/20/15 192 lb 8 oz (87.317 kg)  04/19/15 194 lb 6.4 oz (88.179 kg)    Physical Exam  Constitutional: She appears well-developed and well-nourished. No distress.  HENT:  Nasal wound well approximated. 4 sutures removed with suture removal kit. Area dressed with abx ointment and bandage. Discussed after care.  Abdominal: Soft. Bowel sounds are normal. She exhibits distension. She exhibits no mass. There is no hepatosplenomegaly. There is no tenderness. There is no rebound and no guarding. A hernia is  present. Hernia confirmed positive in the ventral area (large ventral hernia).  Soft nontender abdomen  Nursing note and vitals reviewed.      Assessment & Plan:   Problem List Items Addressed This Visit    Nasal laceration - Primary    4 sutures removed. Pt tolerated well. Aftercare discussed.      Colonic constipation    Discussed sennakot, miralax use, rec increase water intake.          Follow up plan: Return if symptoms worsen or fail to improve.

## 2015-04-24 NOTE — Assessment & Plan Note (Signed)
4 sutures removed. Pt tolerated well. Aftercare discussed.

## 2015-04-24 NOTE — Progress Notes (Signed)
Pre visit review using our clinic review tool, if applicable. No additional management support is needed unless otherwise documented below in the visit note. 

## 2015-04-24 NOTE — Assessment & Plan Note (Signed)
Discussed sennakot, miralax use, rec increase water intake.

## 2015-04-24 NOTE — Patient Instructions (Addendum)
Take 1 sennakot pill daily as needed for constipation.  May also use some miralax powder to help for constipation.  Drink plenty of water.  4 sutures removed today.  We will see what social worker recommends. Keep appointment 10/18.

## 2015-04-27 ENCOUNTER — Telehealth: Payer: Self-pay

## 2015-04-27 NOTE — Telephone Encounter (Signed)
Gina [name] at protective services at [employer] left v/m; has adult protective service case due to pt not being able to take meds correctly and [name] wants to know if could get PCS services to pt. [place] request cb. [name] understands late in the day but she just got off the phone with the pt.

## 2015-04-27 NOTE — Telephone Encounter (Signed)
Spoke with Dr Darnell Level and he said to give verbal OK for home health nursing eval. Spoke with Malachy Mood with [facility] and gave verbal OK for home health nursing eval, Malachy Mood said she would see pt on 04/28/15 and she will cb to [facility] first of next week with update.

## 2015-04-27 NOTE — Telephone Encounter (Signed)
Left message with # provided. Agree with PCS (personal care services). Pt already set up with [facility].

## 2015-04-27 NOTE — Telephone Encounter (Signed)
Gina [name] with [facility] left v/m requesting verbal order for home health nursing eval; pt contacted [facility] for assistance with her medications.Please advise.

## 2015-04-27 NOTE — Telephone Encounter (Signed)
[name] from [facility] left v/m concerned that pt calls [facility] a lot with questions and seems confused about medications; pt was to go to [facility] to assist with meds but pt did not come in. [name] wants to know if any consultations can be done for assistance in the home.

## 2015-04-27 NOTE — Telephone Encounter (Signed)
plz notify [name] referral to [facility] has been made.

## 2015-05-01 DIAGNOSIS — R296 Repeated falls: Secondary | ICD-10-CM | POA: Diagnosis not present

## 2015-05-01 DIAGNOSIS — F419 Anxiety disorder, unspecified: Secondary | ICD-10-CM | POA: Diagnosis not present

## 2015-05-01 DIAGNOSIS — E785 Hyperlipidemia, unspecified: Secondary | ICD-10-CM | POA: Diagnosis not present

## 2015-05-01 DIAGNOSIS — Z85038 Personal history of other malignant neoplasm of large intestine: Secondary | ICD-10-CM | POA: Diagnosis not present

## 2015-05-01 DIAGNOSIS — I129 Hypertensive chronic kidney disease with stage 1 through stage 4 chronic kidney disease, or unspecified chronic kidney disease: Secondary | ICD-10-CM | POA: Diagnosis not present

## 2015-05-01 DIAGNOSIS — N189 Chronic kidney disease, unspecified: Secondary | ICD-10-CM | POA: Diagnosis not present

## 2015-05-01 DIAGNOSIS — F319 Bipolar disorder, unspecified: Secondary | ICD-10-CM | POA: Diagnosis not present

## 2015-05-01 DIAGNOSIS — G4733 Obstructive sleep apnea (adult) (pediatric): Secondary | ICD-10-CM | POA: Diagnosis not present

## 2015-05-01 NOTE — Telephone Encounter (Signed)
Pam at [facility] notified and will relay message.

## 2015-05-02 ENCOUNTER — Telehealth: Payer: Self-pay

## 2015-05-02 NOTE — Telephone Encounter (Signed)
Gina [name] with [facility] left v/m requesting cb since home health eval done. Mail box full so could not leave vm requesting cb from [place]. Will try again.

## 2015-05-03 ENCOUNTER — Ambulatory Visit (INDEPENDENT_AMBULATORY_CARE_PROVIDER_SITE_OTHER): Payer: Medicare Other | Admitting: Licensed Clinical Social Worker

## 2015-05-03 DIAGNOSIS — F319 Bipolar disorder, unspecified: Secondary | ICD-10-CM | POA: Diagnosis not present

## 2015-05-03 NOTE — Progress Notes (Signed)
   THERAPIST PROGRESS NOTE  Session Time: 46min  Participation Level: Active  Behavioral Response: DisheveledAlertAnxious  Type of Therapy: Family Therapy  Treatment Goals addressed: Coping  Interventions: CBT  Summary: Gina [name] is a 64 y.o. female who presents with continued symptoms of her diagnosis.  [name] continues to be non compliant with medication.  She was able to voice that she wants to attend a day program to socialize.  [name] denies mania.   Suicidal/Homicidal: Nowithout intent/plan  Therapist Response: Therapist assessed mood and symptoms. Therapist will refer to [facility] for [facility]  Plan: Return again in 2 weeks.  Diagnosis: Axis I: Bipolar, mixed    Axis II: No diagnosis    Lubertha South 05/03/2015

## 2015-05-04 ENCOUNTER — Emergency Department: Payer: Medicare Other

## 2015-05-04 ENCOUNTER — Emergency Department
Admission: EM | Admit: 2015-05-04 | Discharge: 2015-05-04 | Disposition: A | Discharge status: Home / Self Care | Payer: Medicare Other | Attending: Emergency Medicine | Admitting: Emergency Medicine

## 2015-05-04 ENCOUNTER — Encounter: Payer: Self-pay | Admitting: *Deleted

## 2015-05-04 ENCOUNTER — Telehealth: Payer: Self-pay | Admitting: Family Medicine

## 2015-05-04 DIAGNOSIS — N183 Chronic kidney disease, stage 3 (moderate): Secondary | ICD-10-CM | POA: Insufficient documentation

## 2015-05-04 DIAGNOSIS — Z880 Allergy status to penicillin: Secondary | ICD-10-CM | POA: Diagnosis not present

## 2015-05-04 DIAGNOSIS — I129 Hypertensive chronic kidney disease with stage 1 through stage 4 chronic kidney disease, or unspecified chronic kidney disease: Secondary | ICD-10-CM | POA: Diagnosis not present

## 2015-05-04 DIAGNOSIS — Z79899 Other long term (current) drug therapy: Secondary | ICD-10-CM | POA: Insufficient documentation

## 2015-05-04 DIAGNOSIS — F319 Bipolar disorder, unspecified: Secondary | ICD-10-CM | POA: Diagnosis not present

## 2015-05-04 DIAGNOSIS — R1084 Generalized abdominal pain: Secondary | ICD-10-CM | POA: Diagnosis not present

## 2015-05-04 DIAGNOSIS — Z791 Long term (current) use of non-steroidal anti-inflammatories (NSAID): Secondary | ICD-10-CM | POA: Insufficient documentation

## 2015-05-04 DIAGNOSIS — R140 Abdominal distension (gaseous): Secondary | ICD-10-CM | POA: Diagnosis not present

## 2015-05-04 DIAGNOSIS — K5900 Constipation, unspecified: Secondary | ICD-10-CM | POA: Diagnosis not present

## 2015-05-04 LAB — BASIC METABOLIC PANEL
ANION GAP: 6 (ref 5–15)
BUN: 27 mg/dL — AB (ref 6–20)
CHLORIDE: 114 mmol/L — AB (ref 101–111)
CO2: 27 mmol/L (ref 22–32)
Calcium: 9.5 mg/dL (ref 8.9–10.3)
Creatinine, Ser: 1.14 mg/dL — ABNORMAL HIGH (ref 0.44–1.00)
GFR calc Af Amer: 58 mL/min — ABNORMAL LOW (ref >60)
GFR, EST NON AFRICAN AMERICAN: 50 mL/min — AB (ref >60)
GLUCOSE: 83 mg/dL (ref 65–99)
POTASSIUM: 4.5 mmol/L (ref 3.5–5.1)
Sodium: 147 mmol/L — ABNORMAL HIGH (ref 135–145)

## 2015-05-04 LAB — HEPATIC FUNCTION PANEL
ALK PHOS: 138 U/L — AB (ref 38–126)
ALT: 49 U/L (ref 14–54)
AST: 52 U/L — AB (ref 15–41)
Albumin: 4.1 g/dL (ref 3.5–5.0)
BILIRUBIN TOTAL: 0.4 mg/dL (ref 0.3–1.2)
Total Protein: 7.4 g/dL (ref 6.5–8.1)

## 2015-05-04 LAB — CBC
HEMATOCRIT: 35.2 % (ref 35.0–47.0)
HEMOGLOBIN: 11.8 g/dL — AB (ref 12.0–16.0)
MCH: 30.2 pg (ref 26.0–34.0)
MCHC: 33.4 g/dL (ref 32.0–36.0)
MCV: 90.5 fL (ref 80.0–100.0)
Platelets: 143 10*3/uL — ABNORMAL LOW (ref 150–440)
RBC: 3.89 MIL/uL (ref 3.80–5.20)
RDW: 15.8 % — ABNORMAL HIGH (ref 11.5–14.5)
WBC: 7.7 10*3/uL (ref 3.6–11.0)

## 2015-05-04 MED ORDER — SIMETHICONE 80 MG PO CHEW: 80.0000 mg | CHEWABLE_TABLET | Freq: Four times a day (QID) | ORAL | Status: DC | PRN

## 2015-05-04 NOTE — ED Notes (Signed)
Pt returned from radiology.

## 2015-05-04 NOTE — Discharge Instructions (Signed)
Bloating Bloating is the feeling of fullness in your belly. You may feel as though your pants are too tight. Often the cause of bloating is overeating, retaining fluids, or having gas in your bowel. It is also caused by swallowing air and eating foods that cause gas. Irritable bowel syndrome is one of the most common causes of bloating. Constipation is also a common cause. Sometimes more serious problems can cause bloating. SYMPTOMS  Usually there is a feeling of fullness, as though your abdomen is bulged out. There may be mild discomfort.  DIAGNOSIS  Usually no particular testing is necessary for most bloating. If the condition persists and seems to become worse, your caregiver may do additional testing.  TREATMENT   There is no direct treatment for bloating.  Do not put gas into the bowel. Avoid chewing gum and sucking on candy. These tend to make you swallow air. Swallowing air can also be a nervous habit. Try to avoid this.  Avoiding high residue diets will help. Eat foods with soluble fibers (examples include root vegetables, apples, or barley) and substitute dairy products with soy and rice products. This helps irritable bowel syndrome.  If constipation is the cause, then a high residue diet with more fiber will help.  Avoid carbonated beverages.  Over-the-counter preparations are available that help reduce gas. Your pharmacist can help you with this. SEEK MEDICAL CARE IF:   Bloating continues and seems to be getting worse.  You notice a weight gain.  You have a weight loss but the bloating is getting worse.  You have changes in your bowel habits or develop nausea or vomiting. SEEK IMMEDIATE MEDICAL CARE IF:   You develop shortness of breath or swelling in your legs.  You have an increase in abdominal pain or develop chest pain. Document Released: 05/21/2006 Document Revised: 10/13/2011 Document Reviewed: 07/09/2007 ExitCare Patient Information 2015 [employer]. This  information is not intended to replace advice given to you by your health care provider. Make sure you discuss any questions you have with your health care provider.  

## 2015-05-04 NOTE — ED Notes (Signed)
Brought by ems for hernia and constipaiton.  Patient could not find ride to hospital.  Also told ems that she had a bug crawl out of her arm today and is worried about more being in her body.

## 2015-05-04 NOTE — ED Notes (Signed)
Pt up and walking.  Gave her some ice chips for dry mouth.  Pt encouraged to sit in regular chair for comfort.

## 2015-05-04 NOTE — ED Provider Notes (Signed)
[facility] Emergency Department Provider Note  Time seen: 9:46 PM  I have reviewed the triage vital signs and the nursing notes.   HISTORY  Chief Complaint Hernia and Constipation    HPI Gina [name] is a 64 y.o. female with a past medical history of bipolar, anemia, hyperlipidemia, hypertension, CK D, presents the emergency department with abdominal bloating. According to the patient for the past 2 days she has noticed abdominal distention and constipation. States she typically moves her bowels once per day, but has not been able to yesterday or today. Denies any abdominal pain, nausea, vomiting, diarrhea. Denies dysuria. Denies fever.     Past Medical History  Diagnosis Date  . Bipolar depression     sees psychiatrist - psych admission 03/2015  . DJD (degenerative joint disease), lumbar     chronic lower back pain  . Obesity (BMI 30-39.9)   . History of colon cancer     s/p surgery  . 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 OSA on CPAP     6cm H2O  . RBBB   . Anemia in chronic kidney disease   . Blood transfusion without reported diagnosis 2009  . Cataract     left  . GERD (gastroesophageal reflux disease)   . Hyperlipidemia   . Hypertension   . History of bladder cancer 1997  . History of uterine cancer     s/p hysterectomy  . CKD (chronic kidney disease) stage 3, GFR 30-59 ml/min   . Personal history of colonic adenomas and colon cancer 11/11/2012  . IDA (iron deficiency anemia) 01/2013    thought due to h/o polyps  . Positive hepatitis C antibody test 09/2014    but negative confirmatory testing  . Colon cancer 1990's  . Family history of breast cancer   . Family history of colon cancer   . Family history of stomach cancer     Patient Active Problem List   Diagnosis Date Noted  . Nasal laceration 04/20/2015  . Acute on chronic kidney failure 03/30/2015  . Calculus of gallbladder 03/28/2015  .  NASH (nonalcoholic steatohepatitis) 03/28/2015  . Gout 03/27/2015  . HCV antibody positive 03/27/2015  . History of neoplasm of bladder 03/27/2015  . History of prolonged Q-T interval on ECG 03/27/2015  . Elevated fasting blood sugar 03/27/2015  . Colonic constipation 03/27/2015  . Elevation of level of transaminase or lactic acid dehydrogenase (LDH) 03/27/2015  . General psychoses 03/26/2015  . Genetic testing 03/21/2015  . Family history of breast cancer   . Family history of colon cancer   . Family history of stomach cancer   . History of anemia 11/27/2014  . Bladder cancer 11/27/2014  . History of urinary anomaly 11/27/2014  . Uterine cancer 11/27/2014  . Allergic rhinitis 11/03/2014  . Contact dermatitis 11/03/2014  . Advanced care planning/counseling discussion 10/11/2014  . Memory deficit 09/13/2014  . Positive hepatitis C antibody test 09/04/2014  . Abnormal mental state 09/04/2014  . Amnesia 09/04/2014  . Thrombocytopenia 08/12/2014  . Prediabetes 06/07/2014  . HTN (hypertension) 06/07/2014  . Trouble in sleeping 05/10/2014  . Right knee pain 03/16/2014  . Sleep apnea 02/13/2014  . Hyperlipidemia   . IDA (iron deficiency anemia) 01/26/2013  . History of colonic polyps 11/11/2012  . Recurrent falls 10/02/2012  . Nasal congestion 10/02/2012  . Medicare annual wellness visit, subsequent 08/27/2012  . Urine incontinence 08/27/2012  . Therapeutic drug monitoring 08/16/2012  .  Recurrent ventral hernia 08/11/2012  . OSA on CPAP   . Intertrigo 05/27/2012  . CKD (chronic kidney disease) stage 3, GFR 30-59 ml/min   . Obesity (BMI 30-39.9)   . Colon cancer 04/07/2012  . 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 Bipolar 1 disorder 04/07/2012  . H/O malignant neoplasm of rectum, rectosigmoid junction, and anus 08/05/2011    Past Surgical History  Procedure Laterality Date  . Hernia repair  02/04/12  . Partial colectomy  about 2008    for colon cancer  . I & d abdominal  wound  02/19/12  . Removal of infected mesh and abdominal wound vac placement  02/24/12  . Reexploration of abdominal wound and allograft placemet  02/26/12  . Partial hysterectomy  1981    uterine cancer, R ovary remains  . Left oophorectomy  2005  . Colonoscopy  07/2009  . Dexa  12/2009    WNL  . Dobutamine stress echo  12/2009    no evidence of ischemia  . Colonoscopy  11/2012    2 tubular adenomas, mild diverticulosis, pending genetic testing for Lynch syndrome [name]) rpt 2 yrs  . Sleep study  02/2014    OSA - AHI 55, nadir 81% [name])  . Breast biopsy Right 01/2014    fibroadenoma  . Colonoscopy  02/2015    TA, diverticulosis, rpt 2 yrs [name])    Current Outpatient Rx  Name  Route  Sig  Dispense  Refill  . allopurinol (ZYLOPRIM) 100 MG tablet   Oral   Take 1 tablet (100 mg total) by mouth daily.   30 tablet   2   . amLODipine (NORVASC) 10 MG tablet   Oral   Take 1 tablet (10 mg total) by mouth daily.   30 tablet   11   . azelastine (ASTELIN) 0.1 % nasal spray   Each Nare   Place 1 spray into both nostrils 2 (two) times daily. Use in each nostril as directed   30 mL   12   . Cholecalciferol (VITAMIN D3) 400 UNITS CAPS   Oral   Take 400 Units by mouth daily.         . clonazePAM (KLONOPIN) 0.5 MG tablet   Oral   Take 1 tablet (0.5 mg total) by mouth 2 (two) times daily.   60 tablet   1   . diclofenac (VOLTAREN) 0.1 % ophthalmic solution      1 drop.         . dicyclomine (BENTYL) 20 MG tablet   Oral   Take 20 mg by mouth every 6 (six) hours as needed for spasms.         . fluticasone (FLONASE) 50 MCG/ACT nasal spray   Each Nare   Place 2 sprays into both nostrils daily.   16 g   6   . hydrocortisone (ANUSOL-HC) 2.5 % rectal cream   Rectal   Place rectally.         . indomethacin (INDOCIN) 50 MG capsule   Oral   Take 50 mg by mouth 2 (two) times daily as needed (for gout flare).          Marland Kitchen lovastatin (MEVACOR) 40 MG tablet   Oral    Take 1 tablet (40 mg total) by mouth at bedtime.   90 tablet   3   . metoprolol tartrate (LOPRESSOR) 25 MG tablet   Oral   Take 1.5 tablets (37.5 mg total) by mouth 2 (two) times daily.  90 tablet   11   . nystatin ointment (MYCOSTATIN)   Topical   Apply 1 application topically 2 (two) times daily.   60 g   1     Cancel cream Rx   . OLANZapine (ZYPREXA) 20 MG tablet   Oral   Take 20 mg by mouth at bedtime.         . ondansetron (ZOFRAN) 4 MG tablet   Oral   Take 4 mg by mouth every 8 (eight) hours as needed for nausea or vomiting.         Marland Kitchen PROCTOZONE-HC 2.5 % rectal cream                 Dispense as written.   . sennosides-docusate sodium (SENOKOT-S) 8.6-50 MG tablet   Oral   Take 1 tablet by mouth daily as needed for constipation.         . simethicone (MYLICON) 573 MG chewable tablet   Oral   Chew 125 mg by mouth every 6 (six) hours as needed for flatulence.         . VOLTAREN 1 % GEL      APPLY TO AFFECTED AREA THREE TIMES DAILY   100 g   1     Allergies Risperidone and related; Penicillins; Risperdal; Ivp dye; Tetanus toxoids; and Zetia  Family History  Problem Relation Age of Onset  . Colon cancer Mother 67  . Stomach cancer Mother     dx in her 76s?  Marland Kitchen Colon cancer Sister 33    Maternal half sister  . CAD Father     MI  . Diabetes Other     aunts and uncles both sides  . Arthritis Other     strong fmhx  . Mental illness Sister     anxiety/depression  . Breast cancer Maternal Grandmother   . Esophageal cancer Neg Hx   . Rectal cancer Neg Hx     Social History Social History  Substance Use Topics  . Smoking status: Never Smoker   . Smokeless tobacco: Never Used  . Alcohol Use: No    Review of Systems Constitutional: Negative for fever. Cardiovascular: Negative for chest pain. Respiratory: Negative for shortness of breath. Gastrointestinal: Negative for abdominal pain, vomiting and diarrhea. Positive for abdominal  bloating. Genitourinary: Negative for dysuria. Neurological: Negative for headache 10-point ROS otherwise negative.  ____________________________________________   PHYSICAL EXAM:  VITAL SIGNS: ED Triage Vitals  Enc Vitals Group     BP 05/04/15 1636 123/64 mmHg     Pulse Rate 05/04/15 1636 71     Resp 05/04/15 1636 16     Temp 05/04/15 1636 98.4 F (36.9 C)     Temp src --      SpO2 05/04/15 1636 94 %     Weight --      Height --      Head Cir --      Peak Flow --      Pain Score --      Pain Loc --      Pain Edu? --      Excl. in [place]? --     Constitutional: Alert and oriented. Well appearing and in no distress. Eyes: Normal exam ENT   Mouth/Throat: Mucous membranes are moist. Cardiovascular: Normal rate, regular rhythm. No murmur Respiratory: Normal respiratory effort without tachypnea nor retractions. Breath sounds are clear and equal bilaterally. No wheezes/rales/rhonchi. Gastrointestinal: Mild distention, nontender. No rebound or guarding. Musculoskeletal: Nontender with  normal range of motion in all extremities. Neurologic:  Normal speech and language. No gross focal neurologic deficits are appreciated. Speech is normal. Skin:  Skin is warm, dry and intact.  Psychiatric: Mood and affect are normal. Speech and behavior are normal.  ____________________________________________   INITIAL IMPRESSION / ASSESSMENT AND PLAN / ED COURSE  Pertinent labs & imaging results that were available during my care of the patient were reviewed by me and considered in my medical decision making (see chart for details).  Patient with abdominal distention/bloating. No abdominal tenderness. Dull percussion. Patient states she has had abdominal distention for years, but it is somewhat worse over the past 2 days. We'll check basic labs as well as abdominal x-rays to rule out obstruction. Patient appears overall very well in the emergency department. Patient is very anxious, but  otherwise normal exam.  X-ray consistent with mild constipation. Labs are largely at the patient's baseline. We'll discharge the patient home with primary care follow-up. Patient agreeable to plan.  ____________________________________________   FINAL CLINICAL IMPRESSION(S) / ED DIAGNOSES  Abdominal distention Bloating   Harvest Dark, MD 05/04/15 2322

## 2015-05-04 NOTE — Telephone Encounter (Signed)
[place]    --------------------------------------------------------------------------------   Patient Name: JESSICIA NAPOLITANO  Gender: Female  DOB: 02-Jul-1951   Age: 64 Y 10 M 19 D  Return Phone Number: [phone] (Primary)  Address:     City/State/Zip:  [place]     Client [facility] - Client  Client Site [facility]  Physician Ria Bush   Contact Type Call  Call Type Triage / Clinical  Caller Name Joycelyn Schmid  Relationship To Patient Self  Appointment Disposition EMR Appointment Not Necessary  Return Phone Number [phone] (Primary)  Chief Complaint Hernia Symptoms  Initial Comment Caller states her Hernia is getting bigger.  PreDisposition Go to ED       Nurse Assessment  Nurse: Amalia Hailey, RN, Melissa Date/Time (Eastern Time): 05/04/2015 3:08:42 PM  Confirm and document reason for call. If symptomatic, describe symptoms. ---Caller states her Hernia is getting bigger.    Has the patient traveled out of the country within the last 30 days? ---Not Applicable    Does the patient require triage? ---Yes    Related visit to physician within the last 2 weeks? ---Yes    Does the PT have any chronic conditions? (i.e. diabetes, asthma, etc.) ---Yes    List chronic conditions. ---hernias @ waistline, high cholesterol, hypertension, Surgery for Bowel Cancer,           Guidelines          Guideline Title Affirmed Question Affirmed Notes Nurse Date/Time (Eastern Time)  Hernia Hernia is painful or tender to touch    Amalia Hailey, RN, Melissa 05/04/2015 3:14:14 PM    Disp. Time Eilene Ghazi Time) Disposition Final User         05/04/2015 3:17:02 PM Go to ED Now Yes Amalia Hailey, RN, Lenna Sciara            Caller Understands: Yes  Disagree/Comply: Comply       Care Advice Given Per Guideline        GO TO ED NOW: You need to be seen in the  Emergency Department. Go to the ER at ___________ [place] now. Drive carefully. NOTHING BY MOUTH: Do not eat or drink anything for now. (Reason: condition may need surgery and general anesthesia.) DRIVING: Another adult should drive. * It is also a good idea to bring the pill bottles too. This will help the doctor to make certain you are taking the right medicines and the right dose. * Please bring a list of your current medicines when you go to the Emergency Department (ER). BRING MEDICINES:    After Care Instructions Given        Call Event Type User Date / Time Description        --------------------------------------------------------------------------------            Referrals  [facility] - ED

## 2015-05-04 NOTE — ED Notes (Signed)
Pt to radiology w/ rad tech via stretcher.

## 2015-05-04 NOTE — Telephone Encounter (Signed)
Tried to touch base with patient - chronic issue, don't think she needed ER evaluation unless hernias causing pain or obstruction. Spoke to sister - pt already called ambulance and went to ER. Vincente Liberty can we check with [facility] again to see if certain patients they can check with [place] prior to sending to ER??

## 2015-05-07 NOTE — Telephone Encounter (Signed)
Josh, This is another example of a provider not feeling like the patient should go to the ER because this is a chronic condition. Vincente Liberty

## 2015-05-08 ENCOUNTER — Telehealth: Payer: Self-pay

## 2015-05-08 NOTE — Telephone Encounter (Signed)
[name] at [facility] left v/m; pt came in 05/07/15 to get med boxes filled correctly at [facility]; [name] wants to know if pt should be taking Vit D OTC; is still on med list but pt has not been taking Vit D. [name] request cb for clarification. Vit D is on med list.Please advise.

## 2015-05-08 NOTE — Telephone Encounter (Signed)
Yes continue vit D 400 units daily.

## 2015-05-09 NOTE — Telephone Encounter (Signed)
Brittany at [facility] notified.  

## 2015-05-10 ENCOUNTER — Encounter: Payer: Self-pay | Admitting: Family Medicine

## 2015-05-11 ENCOUNTER — Ambulatory Visit: Payer: Medicare Other | Admitting: Family Medicine

## 2015-05-11 ENCOUNTER — Telehealth: Payer: Self-pay | Admitting: Family Medicine

## 2015-05-11 DIAGNOSIS — F319 Bipolar disorder, unspecified: Secondary | ICD-10-CM | POA: Diagnosis not present

## 2015-05-11 DIAGNOSIS — Z79891 Long term (current) use of opiate analgesic: Secondary | ICD-10-CM | POA: Diagnosis not present

## 2015-05-11 NOTE — Telephone Encounter (Signed)
[facility] - Client [facility]  Patient Name: Gina [name]  DOB: 24-Sep-1950    Initial Comment Caller states feet are swollen.   Nurse Assessment  Nurse: Verlin Fester RN, Stanton Kidney Date/Time Eilene Ghazi Time): 05/11/2015 2:31:57 PM  Confirm and document reason for call. If symptomatic, describe symptoms. ---Patient states her feet are swollen for a couple of days. She is propping them up.  Has the patient traveled out of the country within the last 30 days? ---No  Does the patient have any new or worsening symptoms? ---Yes  Will a triage be completed? ---Yes  Related visit to physician within the last 2 weeks? ---No  Does the PT have any chronic conditions? (i.e. diabetes, asthma, etc.) ---Yes  List chronic conditions. ---"HTN, cholesterol"     Guidelines    Guideline Title Affirmed Question Affirmed Notes  Leg Swelling and Edema [1] MILD swelling of both ankles (i.e., pedal edema) AND [2] new onset or worsening    Final Disposition User   See PCP When Office is Open (within 3 days) Noe, RN, Stanton Kidney    Comments  After triage patient refused appointment, she states she has to call and make sure she can get transportation and she wants to give this three days and if it is not better she will come into be seen.   Disagree/Comply: Disagree  Disagree/Comply Reason: Disagree with instructions

## 2015-05-11 NOTE — Telephone Encounter (Signed)
Gina [name] said has already been taken care of. But Gina [name] with Gina [name] does need copy of pts problem list and med list faxed to [phone]; this will be for nurse to go out and assist with fixing medication boxes for pt.done.

## 2015-05-11 NOTE — Telephone Encounter (Signed)
Unable to reach [place] and spoke with Davita at [facility] [phone] and she will have Malachy Mood call office to give more info on what type home health referral is needed.

## 2015-05-15 ENCOUNTER — Ambulatory Visit: Payer: Self-pay | Admitting: Psychiatry

## 2015-05-15 ENCOUNTER — Ambulatory Visit (INDEPENDENT_AMBULATORY_CARE_PROVIDER_SITE_OTHER): Payer: Medicare Other | Admitting: Psychiatry

## 2015-05-15 ENCOUNTER — Encounter: Payer: Self-pay | Admitting: Psychiatry

## 2015-05-15 VITALS — BP 118/80 | HR 62 | Temp 97.1°F | Ht 61.5 in | Wt 195.2 lb

## 2015-05-15 DIAGNOSIS — G4733 Obstructive sleep apnea (adult) (pediatric): Secondary | ICD-10-CM | POA: Diagnosis not present

## 2015-05-15 DIAGNOSIS — F3112 Bipolar disorder, current episode manic without psychotic features, moderate: Secondary | ICD-10-CM

## 2015-05-15 DIAGNOSIS — F419 Anxiety disorder, unspecified: Secondary | ICD-10-CM | POA: Diagnosis not present

## 2015-05-15 DIAGNOSIS — N189 Chronic kidney disease, unspecified: Secondary | ICD-10-CM | POA: Diagnosis not present

## 2015-05-15 DIAGNOSIS — I129 Hypertensive chronic kidney disease with stage 1 through stage 4 chronic kidney disease, or unspecified chronic kidney disease: Secondary | ICD-10-CM | POA: Diagnosis not present

## 2015-05-15 DIAGNOSIS — F319 Bipolar disorder, unspecified: Secondary | ICD-10-CM | POA: Diagnosis not present

## 2015-05-15 DIAGNOSIS — E785 Hyperlipidemia, unspecified: Secondary | ICD-10-CM | POA: Diagnosis not present

## 2015-05-15 MED ORDER — OLANZAPINE 20 MG PO TABS: 20.0000 mg | ORAL_TABLET | Freq: Every day | ORAL | Status: DC

## 2015-05-15 NOTE — Progress Notes (Signed)
BH MD/PA/NP OP Progress Note  05/15/2015 12:55 PM Gina [name]  MRN:  [id number]  Subjective:  Patient returns for follow-up for bipolar disorder, most recent episode manic. She has a nurse now coming to her home twice a week to assist her with medications. It does appear she is less hypomanic than she was in her last visit. Her speech is slower and more focused. There is less of a need to interrupt her. She does listen to your questions and answers appropriately. It does appear based on this change that perhaps medication adherence may have been an issue. He is not reporting any falls or issues relate that may have been related to past medications and perhaps non adherence issues.  At this time were going to continue her clonazepam at 0.5 mg twice a day. I'm going to increase her Zyprexa back to 20 mg a day. I am reluctant to start Depakote or lithium that can become toxic or problematic based on blood levels and interactions with other medications.  Nizoral depression in fact asked again about a daytime activity program which I think would be good for the patient.  Chief Complaint:  Chief Complaint    Follow-up; Medication Refill     Visit Diagnosis:     ICD-9-CM ICD-10-CM   1. Bipolar 1 disorder, manic, moderate (HCC) 296.42 F31.12     Past Medical History:  Past Medical History  Diagnosis Date  . Bipolar depression [facility])     sees psychiatrist - psych admission 03/2015  . DJD (degenerative joint disease), lumbar     chronic lower back pain  . Obesity (BMI 30-39.9)   . History of colon cancer     s/p surgery  . 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 OSA on CPAP     6cm H2O  . RBBB   . Anemia in chronic kidney disease   . Blood transfusion without reported diagnosis 2009  . Cataract     left  . GERD (gastroesophageal reflux disease)   . Hyperlipidemia   . Hypertension   . History of bladder cancer 1997  . History of uterine cancer      s/p hysterectomy  . CKD (chronic kidney disease) stage 3, GFR 30-59 ml/min   . Personal history of colonic adenomas and colon cancer 11/11/2012  . IDA (iron deficiency anemia) 01/2013    thought due to h/o polyps  . Positive hepatitis C antibody test 09/2014    but negative confirmatory testing  . Colon cancer ([place]) 1990's  . Family history of breast cancer   . Family history of colon cancer   . Family history of stomach cancer     Past Surgical History  Procedure Laterality Date  . Hernia repair  02/04/12  . Partial colectomy  about 2008    for colon cancer  . I & d abdominal wound  02/19/12  . Removal of infected mesh and abdominal wound vac placement  02/24/12  . Reexploration of abdominal wound and allograft placemet  02/26/12  . Partial hysterectomy  1981    uterine cancer, R ovary remains  . Left oophorectomy  2005  . Colonoscopy  07/2009  . Dexa  12/2009    WNL  . Dobutamine stress echo  12/2009    no evidence of ischemia  . Colonoscopy  11/2012    2 tubular adenomas, mild diverticulosis, pending genetic testing for Lynch syndrome [name]) rpt 2 yrs  . Sleep study  02/2014    OSA - AHI 55, nadir 81% [name])  . Breast biopsy Right 01/2014    fibroadenoma  . Colonoscopy  02/2015    TA, diverticulosis, rpt 2 yrs [name])   Family History:  Family History  Problem Relation Age of Onset  . Colon cancer Mother 26  . Stomach cancer Mother     dx in her 11s?  Marland Kitchen Colon cancer Sister 37    Maternal half sister  . CAD Father     MI  . Diabetes Other     aunts and uncles both sides  . Arthritis Other     strong fmhx  . Mental illness Sister     anxiety/depression  . Breast cancer Maternal Grandmother   . Esophageal cancer Neg Hx   . Rectal cancer Neg Hx    Social History:  Social History   Social History  . Marital Status: Single    Spouse Name: N/A  . Number of Children: 0  . Years of Education: N/A   Social History Main Topics  . Smoking status: Never Smoker    . Smokeless tobacco: Never Used  . Alcohol Use: No  . Drug Use: No  . Sexual Activity: No   Other Topics Concern  . None   Social History Narrative   Lives with sister, no pets   Occupation: disabled, for bipolar and arthritis   Edu: GED   Activity: take walks   Diet: good water, vegetables daily      Psych - [place], Dr. Jimmye Norman (ph [phone])   Additional History:   Assessment:   Musculoskeletal: Strength & Muscle Tone: within normal limits Gait & Station: Patient has normal gait but ambulates slowly Patient leans: N/A  Psychiatric Specialty Exam: HPI  Review of Systems  Psychiatric/Behavioral: Negative for depression, suicidal ideas, hallucinations, memory loss and substance abuse. The patient is not nervous/anxious and does not have insomnia.     Blood pressure 118/80, pulse 62, temperature 97.1 F (36.2 C), temperature source Tympanic, height 5' 1.5" (1.562 m), weight 195 lb 3.2 oz (88.542 kg), SpO2 98 %.Body mass index is 36.29 kg/(m^2).  General Appearance: Well Groomed  Eye Contact:  Good  Speech:  Slightly rapid but not pressured as in past appointments  Volume:  Normal  Mood:  Good  Affect:  Congruent  Thought Process:  Circumstantial and Disorganized  Orientation:  Full (Time, Place, and Person)  Thought Content:  Negative  Suicidal Thoughts:  No  Homicidal Thoughts:  No  Memory:  Immediate;   Good Recent;   Good Remote;   Fair  Judgement:  Good  Insight:  Good  Psychomotor Activity:  Negative  Concentration:  Good  Recall:  [place] of Knowledge: Fair  Language: Good  Akathisia:  Negative  Handed:  Right unknown  AIMS (if indicated):  Normal- Done on 12/12/14 in [employer] (previous EMR)  Assets:  Desire for Improvement  ADL's:  Intact  Cognition: WNL  Sleep:  Good   Is the patient at risk to self?  No. Has the patient been a risk to self in the past 6 months?  No. Has the patient been a risk to self within the distant  past?  No. Is the patient a risk to others?  No. Has the patient been a risk to others in the past 6 months?  No. Has the patient been a risk to others within the distant past?  No.  Current Medications: Current  Outpatient Prescriptions  Medication Sig Dispense Refill  . allopurinol (ZYLOPRIM) 100 MG tablet Take 1 tablet (100 mg total) by mouth daily. 30 tablet 2  . amLODipine (NORVASC) 10 MG tablet Take 1 tablet (10 mg total) by mouth daily. 30 tablet 11  . azelastine (ASTELIN) 0.1 % nasal spray Place 1 spray into both nostrils 2 (two) times daily. Use in each nostril as directed 30 mL 12  . carbamazepine (TEGRETOL XR) 100 MG 12 hr tablet Take 2 tablets by mouth 3 (three) times daily.    . Cholecalciferol (VITAMIN D3) 400 UNITS CAPS Take 400 Units by mouth daily.    . clonazePAM (KLONOPIN) 0.5 MG tablet Take 1 tablet (0.5 mg total) by mouth 2 (two) times daily. 60 tablet 1  . diclofenac (VOLTAREN) 0.1 % ophthalmic solution 1 drop.    . dicyclomine (BENTYL) 20 MG tablet Take 20 mg by mouth every 6 (six) hours as needed for spasms.    . fluticasone (FLONASE) 50 MCG/ACT nasal spray Place 2 sprays into both nostrils daily. 16 g 6  . hydrocortisone (ANUSOL-HC) 2.5 % rectal cream Place rectally.    . hydrOXYzine (ATARAX/VISTARIL) 50 MG tablet Take 1 tablet by mouth daily.    . indomethacin (INDOCIN) 50 MG capsule Take 50 mg by mouth 2 (two) times daily as needed (for gout flare).     Marland Kitchen lovastatin (MEVACOR) 40 MG tablet Take 1 tablet (40 mg total) by mouth at bedtime. 90 tablet 3  . metoprolol tartrate (LOPRESSOR) 25 MG tablet Take 1.5 tablets (37.5 mg total) by mouth 2 (two) times daily. 90 tablet 11  . nystatin ointment (MYCOSTATIN) Apply 1 application topically 2 (two) times daily. 60 g 1  . OLANZapine (ZYPREXA) 20 MG tablet Take 1 tablet (20 mg total) by mouth at bedtime. 30 tablet 2  . ondansetron (ZOFRAN) 4 MG tablet Take 4 mg by mouth every 8 (eight) hours as needed for nausea or vomiting.     Marland Kitchen PROCTOZONE-HC 2.5 % rectal cream     . sennosides-docusate sodium (SENOKOT-S) 8.6-50 MG tablet Take 1 tablet by mouth daily as needed for constipation.    . simethicone (GAS-X) 80 MG chewable tablet Chew 1 tablet (80 mg total) by mouth 4 (four) times daily as needed for flatulence. 100 tablet 0  . VOLTAREN 1 % GEL APPLY TO AFFECTED AREA THREE TIMES DAILY 100 g 1   No current facility-administered medications for this visit.    Medical Decision Making:  Established Problem, Stable/Improving (1)  Treatment Plan Summary:Medication management Patient is less hypomanic. She is more organized today.  Bipolar disorder type I-increase Zyprexa to 20  mg daily and Klonopin 0.5 mg twice daily. Patient will return in 4 weeks.  Social work has met with patient to assess for additional services in the home as it appears this is becoming an issue and affecting compliance.  Faith Rogue 05/15/2015, 12:55 PM

## 2015-05-16 ENCOUNTER — Ambulatory Visit (INDEPENDENT_AMBULATORY_CARE_PROVIDER_SITE_OTHER): Payer: Medicare Other | Admitting: Licensed Clinical Social Worker

## 2015-05-16 DIAGNOSIS — F3112 Bipolar disorder, current episode manic without psychotic features, moderate: Secondary | ICD-10-CM

## 2015-05-18 ENCOUNTER — Ambulatory Visit (INDEPENDENT_AMBULATORY_CARE_PROVIDER_SITE_OTHER): Payer: Medicare Other | Admitting: Family Medicine

## 2015-05-18 ENCOUNTER — Encounter: Payer: Self-pay | Admitting: Family Medicine

## 2015-05-18 VITALS — BP 126/68 | HR 63 | Temp 97.4°F | Wt 194.5 lb

## 2015-05-18 DIAGNOSIS — M109 Gout, unspecified: Secondary | ICD-10-CM

## 2015-05-18 DIAGNOSIS — R600 Localized edema: Secondary | ICD-10-CM

## 2015-05-18 DIAGNOSIS — Z789 Other specified health status: Secondary | ICD-10-CM | POA: Diagnosis not present

## 2015-05-18 MED ORDER — ALLOPURINOL 100 MG PO TABS: 100.0000 mg | ORAL_TABLET | Freq: Every day | ORAL | Status: DC

## 2015-05-18 MED ORDER — COLCHICINE 0.6 MG PO TABS: 0.6000 mg | ORAL_TABLET | Freq: Every day | ORAL | Status: DC | PRN

## 2015-05-18 MED ORDER — AMLODIPINE BESYLATE 5 MG PO TABS: 5.0000 mg | ORAL_TABLET | Freq: Every day | ORAL | Status: DC

## 2015-05-18 NOTE — Assessment & Plan Note (Deleted)
Unclear gout history, unsure when allopurinol was started.

## 2015-05-18 NOTE — Progress Notes (Signed)
BP 126/68 mmHg  Pulse 63  Temp(Src) 97.4 F (36.3 C) (Oral)  Wt 194 lb 8 oz (88.225 kg)  SpO2 98%   CC: "my feet are swelling up"  Subjective:    Patient ID: Gina [name], female    DOB: 03-15-1951, 64 y.o.   MRN: [id number]  HPI: Gina [name] is a 64 y.o. female presenting on 05/18/2015 for Foot Swelling   Unsure how long sxs ongoing but states she has noted worsening pedal swelling associated with foot pain. Newest med amlodipine 10mg .   Remote h/o gout with latest flare 01/2015, on allopurinol 100mg  daily for last several months. Worried she has a gout flare.   Relevant past medical, surgical, family and social history reviewed and updated as indicated. Interim medical history since our last visit reviewed. Allergies and medications reviewed and updated. Current Outpatient Prescriptions on File Prior to Visit  Medication Sig  . azelastine (ASTELIN) 0.1 % nasal spray Place 1 spray into both nostrils 2 (two) times daily. Use in each nostril as directed  . carbamazepine (TEGRETOL XR) 100 MG 12 hr tablet Take 2 tablets by mouth 3 (three) times daily.  . Cholecalciferol (VITAMIN D3) 400 UNITS CAPS Take 400 Units by mouth daily.  Marland Kitchen dicyclomine (BENTYL) 20 MG tablet Take 20 mg by mouth every 6 (six) hours as needed for spasms.  . fluticasone (FLONASE) 50 MCG/ACT nasal spray Place 2 sprays into both nostrils daily.  . hydrOXYzine (ATARAX/VISTARIL) 50 MG tablet Take 1 tablet by mouth daily.  . indomethacin (INDOCIN) 50 MG capsule Take 50 mg by mouth 2 (two) times daily as needed (for gout flare).   Marland Kitchen lovastatin (MEVACOR) 40 MG tablet Take 1 tablet (40 mg total) by mouth at bedtime.  . metoprolol tartrate (LOPRESSOR) 25 MG tablet Take 1.5 tablets (37.5 mg total) by mouth 2 (two) times daily.  Marland Kitchen nystatin ointment (MYCOSTATIN) Apply 1 application topically 2 (two) times daily.  Marland Kitchen OLANZapine (ZYPREXA) 20 MG tablet Take 1 tablet (20 mg total) by mouth at bedtime.  .  ondansetron (ZOFRAN) 4 MG tablet Take 4 mg by mouth every 8 (eight) hours as needed for nausea or vomiting.  Marland Kitchen PROCTOZONE-HC 2.5 % rectal cream   . sennosides-docusate sodium (SENOKOT-S) 8.6-50 MG tablet Take 1 tablet by mouth daily as needed for constipation.  . simethicone (GAS-X) 80 MG chewable tablet Chew 1 tablet (80 mg total) by mouth 4 (four) times daily as needed for flatulence.  . VOLTAREN 1 % GEL APPLY TO AFFECTED AREA THREE TIMES DAILY  . clonazePAM (KLONOPIN) 0.5 MG tablet Take 1 tablet (0.5 mg total) by mouth 2 (two) times daily.   No current facility-administered medications on file prior to visit.    Review of Systems Per HPI unless specifically indicated above     Objective:    BP 126/68 mmHg  Pulse 63  Temp(Src) 97.4 F (36.3 C) (Oral)  Wt 194 lb 8 oz (88.225 kg)  SpO2 98%  Wt Readings from Last 3 Encounters:  05/18/15 194 lb 8 oz (88.225 kg)  05/15/15 195 lb 3.2 oz (88.542 kg)  04/24/15 190 lb (86.183 kg)    Physical Exam  Constitutional: She appears well-developed and well-nourished. No distress.  Musculoskeletal: She exhibits edema (1+ pitting edema from dorsal foot to lower leg bilaterally).  2+ DP bilaterally Tender to palpation proximal to 1st MTP on right  Skin: Skin is warm and dry. There is erythema.  Mild erythema R medial foot  Nursing  note and vitals reviewed.  Results for orders placed or performed during the hospital encounter of 05/04/15  CBC  Result Value Ref Range   WBC 7.7 3.6 - 11.0 K/uL   RBC 3.89 3.80 - 5.20 MIL/uL   Hemoglobin 11.8 (L) 12.0 - 16.0 g/dL   HCT 35.2 35.0 - 47.0 %   MCV 90.5 80.0 - 100.0 fL   MCH 30.2 26.0 - 34.0 pg   MCHC 33.4 32.0 - 36.0 g/dL   RDW 15.8 (H) 11.5 - 14.5 %   Platelets 143 (L) 150 - 440 K/uL  Basic metabolic panel  Result Value Ref Range   Sodium 147 (H) 135 - 145 mmol/L   Potassium 4.5 3.5 - 5.1 mmol/L   Chloride 114 (H) 101 - 111 mmol/L   CO2 27 22 - 32 mmol/L   Glucose, Bld 83 65 - 99 mg/dL    BUN 27 (H) 6 - 20 mg/dL   Creatinine, Ser 1.14 (H) 0.44 - 1.00 mg/dL   Calcium 9.5 8.9 - 10.3 mg/dL   GFR calc non Af Amer 50 (L) >60 mL/min   GFR calc Af Amer 58 (L) >60 mL/min   Anion gap 6 5 - 15  Hepatic function panel  Result Value Ref Range   Total Protein 7.4 6.5 - 8.1 g/dL   Albumin 4.1 3.5 - 5.0 g/dL   AST 52 (H) 15 - 41 U/L   ALT 49 14 - 54 U/L   Alkaline Phosphatase 138 (H) 38 - 126 U/L   Total Bilirubin 0.4 0.3 - 1.2 mg/dL   Bilirubin, Direct <0.1 (L) 0.1 - 0.5 mg/dL   Indirect Bilirubin NOT CALCULATED 0.3 - 0.9 mg/dL      Assessment & Plan:   Problem List Items Addressed This Visit    Poor historian   Pedal edema - Primary    Suspicion for amlodipine causing worsening pedal edema. Will decrease dose to 5mg  daily and monitor.       Gout    Possible mild gout flare ongoing - will treat with colchicine 0.6mg  QD PRN pain, continue allopurinol 100mg  daily, decrease amlodipine to 5mg  daily. Update with effect, esp if persistent pain/swelling.          Follow up plan: Return if symptoms worsen or fail to improve.

## 2015-05-18 NOTE — Progress Notes (Signed)
   THERAPIST PROGRESS NOTE  Session Time: 82min  Participation Level: Active  Behavioral Response: CasualAlertNA  Type of Therapy: Individual Therapy  Treatment Goals addressed: Coping  Interventions: Motivational Interviewing, Solution Focused and Reframing  Summary: Gina [name] is a 64 y.o. female who presents with continued symptoms of her diagnosis.  Pt. Continues to be non compliant with medication at times.  Currently not taking medications prescribed by current Psychiatrist.  Explored transportation concerns and [place] [facility] evaluation.   Suicidal/Homicidal: Nowithout intent/plan  Therapist Response: Therapist explored with Pt medication compliance and discussed priorities.  Completed referral for PSR programs in [place].  Plan: Return again in 2 weeks.  Diagnosis: Axis I: Bipolar    Axis II: No diagnosis    Gina [name] 05/18/2015

## 2015-05-18 NOTE — Progress Notes (Signed)
Pre visit review using our clinic review tool, if applicable. No additional management support is needed unless otherwise documented below in the visit note. 

## 2015-05-18 NOTE — Assessment & Plan Note (Signed)
Possible mild gout flare ongoing - will treat with colchicine 0.6mg  QD PRN pain, continue allopurinol 100mg  daily, decrease amlodipine to 5mg  daily. Update with effect, esp if persistent pain/swelling.

## 2015-05-18 NOTE — Patient Instructions (Addendum)
Decrease blood pressure medicine called amlodipine to 5mg  daily (1/2 tablet). New lower dose will be sent to your pharmacy.  I think you do have gout flare - continue allopurinol daily, may take colchicine pill one daily as needed for gout foot pain.  Nice to see you today, call us with questions.

## 2015-05-18 NOTE — Assessment & Plan Note (Signed)
Suspicion for amlodipine causing worsening pedal edema. Will decrease dose to 5mg  daily and monitor.

## 2015-05-21 ENCOUNTER — Telehealth: Payer: Self-pay | Admitting: Psychiatry

## 2015-05-22 ENCOUNTER — Ambulatory Visit (INDEPENDENT_AMBULATORY_CARE_PROVIDER_SITE_OTHER): Payer: Medicare Other | Admitting: Family Medicine

## 2015-05-22 ENCOUNTER — Encounter: Payer: Self-pay | Admitting: Family Medicine

## 2015-05-22 VITALS — BP 124/74 | HR 76 | Temp 97.6°F | Ht 61.75 in | Wt 197.8 lb

## 2015-05-22 DIAGNOSIS — R600 Localized edema: Secondary | ICD-10-CM | POA: Diagnosis not present

## 2015-05-22 DIAGNOSIS — I10 Essential (primary) hypertension: Secondary | ICD-10-CM

## 2015-05-22 MED ORDER — HYDROCHLOROTHIAZIDE 12.5 MG PO CAPS: 12.5000 mg | ORAL_CAPSULE | Freq: Every day | ORAL | Status: DC

## 2015-05-22 MED ORDER — AZELASTINE HCL 0.1 % NA SOLN: 1.0000 | Freq: Two times a day (BID) | NASAL | Status: DC

## 2015-05-22 MED ORDER — DICLOFENAC SODIUM 1 % TD GEL: TRANSDERMAL | Status: DC

## 2015-05-22 NOTE — Progress Notes (Signed)
Pre visit review using our clinic review tool, if applicable. No additional management support is needed unless otherwise documented below in the visit note. 

## 2015-05-22 NOTE — Assessment & Plan Note (Signed)
Chronic, stable. Pt did take meds today. Stop amlodipine (?pedal edema effect), start hctz 12.5mg  daily.

## 2015-05-22 NOTE — Telephone Encounter (Signed)
Patient contacted the clinic to inquire about dosing of her medicines. She indicated she had olanzapine 15 mg tablets as well as 20 mg tablets. I clarified with patient that she is to take 20 mg tablets and does not have any use for the 15 mg tablets at this time. She also clarifies she was taking the clonazepam as prescribed. She indicated her home health nurse wrote lines through her carbamazepine and hydroxyzine and that she does not have those. Peers those are medications that were present at her discharge from her psychiatric hospitalization earlier this year. I indicated that she does not need those and we can discuss them at her next appointment. Ideally I'm going to try to manage her on as few medicines as possible given that medication management is an issue. AW

## 2015-05-22 NOTE — Assessment & Plan Note (Signed)
Stop amlodipine - may be causing pedal edema. Start hctz 12.5mg  daily. RTC 1 mo f/u visit

## 2015-05-22 NOTE — Patient Instructions (Addendum)
Stop amlodipine. Start hydrochlorothiazide 12.5mg  daily for blood pressure and leg swelling.   Return in 1 month for follow up.

## 2015-05-22 NOTE — Progress Notes (Signed)
BP 124/74 mmHg  Pulse 76  Temp(Src) 97.6 F (36.4 C) (Oral)  Ht 5' 1.75" (1.568 m)  Wt 197 lb 12 oz (89.699 kg)  BMI 36.48 kg/m2   CC: f/u visit  Subjective:    Patient ID: Gina [name], female    DOB: Oct 27, 1950, 64 y.o.   MRN: [id number]  HPI: Gina [name] is a 64 y.o. female presenting on 05/22/2015 for Follow-up   Seen here Friday with some mild pedal edema and gout flare - treated with colchicine 0.6mg  prn and decreasing amlodipine to 5mg  daily. Blood pressure remaining stable, but feet remain swollen.   Relevant past medical, surgical, family and social history reviewed and updated as indicated. Interim medical history since our last visit reviewed. Allergies and medications reviewed and updated. Current Outpatient Prescriptions on File Prior to Visit  Medication Sig  . allopurinol (ZYLOPRIM) 100 MG tablet Take 1 tablet (100 mg total) by mouth daily.  . Cholecalciferol (VITAMIN D3) 400 UNITS CAPS Take 400 Units by mouth daily.  . colchicine 0.6 MG tablet Take 1 tablet (0.6 mg total) by mouth daily as needed.  . fluticasone (FLONASE) 50 MCG/ACT nasal spray Place 2 sprays into both nostrils daily.  Marland Kitchen lovastatin (MEVACOR) 40 MG tablet Take 1 tablet (40 mg total) by mouth at bedtime.  . metoprolol tartrate (LOPRESSOR) 25 MG tablet Take 1.5 tablets (37.5 mg total) by mouth 2 (two) times daily.  . carbamazepine (TEGRETOL XR) 100 MG 12 hr tablet Take 2 tablets by mouth 3 (three) times daily.  . clonazePAM (KLONOPIN) 0.5 MG tablet Take 1 tablet (0.5 mg total) by mouth 2 (two) times daily.  Marland Kitchen dicyclomine (BENTYL) 20 MG tablet Take 20 mg by mouth every 6 (six) hours as needed for spasms.  . hydrOXYzine (ATARAX/VISTARIL) 50 MG tablet Take 1 tablet by mouth daily.  . indomethacin (INDOCIN) 50 MG capsule Take 50 mg by mouth 2 (two) times daily as needed (for gout flare).   . nystatin ointment (MYCOSTATIN) Apply 1 application topically 2 (two) times daily.  Marland Kitchen OLANZapine  (ZYPREXA) 20 MG tablet Take 1 tablet (20 mg total) by mouth at bedtime.  . ondansetron (ZOFRAN) 4 MG tablet Take 4 mg by mouth every 8 (eight) hours as needed for nausea or vomiting.  Marland Kitchen PROCTOZONE-HC 2.5 % rectal cream   . sennosides-docusate sodium (SENOKOT-S) 8.6-50 MG tablet Take 1 tablet by mouth daily as needed for constipation.  . simethicone (GAS-X) 80 MG chewable tablet Chew 1 tablet (80 mg total) by mouth 4 (four) times daily as needed for flatulence.   No current facility-administered medications on file prior to visit.    Review of Systems Per HPI unless specifically indicated above     Objective:    BP 124/74 mmHg  Pulse 76  Temp(Src) 97.6 F (36.4 C) (Oral)  Ht 5' 1.75" (1.568 m)  Wt 197 lb 12 oz (89.699 kg)  BMI 36.48 kg/m2  Wt Readings from Last 3 Encounters:  05/22/15 197 lb 12 oz (89.699 kg)  05/18/15 194 lb 8 oz (88.225 kg)  05/15/15 195 lb 3.2 oz (88.542 kg)    Physical Exam  Constitutional: She appears well-developed and well-nourished. No distress.  HENT:  Mouth/Throat: Oropharynx is clear and moist. No oropharyngeal exudate.  Cardiovascular: Normal rate, regular rhythm, normal heart sounds and intact distal pulses.   No murmur heard. Pulmonary/Chest: Effort normal and breath sounds normal. No respiratory distress. She has no wheezes. She has no rales.  Musculoskeletal: She  exhibits edema (1+ pedal edema to mid calf).  Skin: Skin is warm and dry. No rash noted.  Psychiatric: She has a normal mood and affect.  Nursing note and vitals reviewed.  Lab Results  Component Value Date   CREATININE 1.14* 05/04/2015   Lab Results  Component Value Date   K 4.5 05/04/2015      Assessment & Plan:   Problem List Items Addressed This Visit    Pedal edema - Primary    Stop amlodipine - may be causing pedal edema. Start hctz 12.5mg  daily. RTC 1 mo f/u visit      HTN (hypertension)    Chronic, stable. Pt did take meds today. Stop amlodipine (?pedal edema  effect), start hctz 12.5mg  daily.      Relevant Medications   hydrochlorothiazide (MICROZIDE) 12.5 MG capsule       Follow up plan: Return in about 4 weeks (around 06/19/2015), or as needed, for follow up visit.

## 2015-05-23 ENCOUNTER — Telehealth: Payer: Self-pay

## 2015-05-23 DIAGNOSIS — F319 Bipolar disorder, unspecified: Secondary | ICD-10-CM | POA: Diagnosis not present

## 2015-05-23 DIAGNOSIS — F419 Anxiety disorder, unspecified: Secondary | ICD-10-CM | POA: Diagnosis not present

## 2015-05-23 DIAGNOSIS — I129 Hypertensive chronic kidney disease with stage 1 through stage 4 chronic kidney disease, or unspecified chronic kidney disease: Secondary | ICD-10-CM | POA: Diagnosis not present

## 2015-05-23 DIAGNOSIS — N189 Chronic kidney disease, unspecified: Secondary | ICD-10-CM | POA: Diagnosis not present

## 2015-05-23 DIAGNOSIS — E785 Hyperlipidemia, unspecified: Secondary | ICD-10-CM | POA: Diagnosis not present

## 2015-05-23 DIAGNOSIS — G4733 Obstructive sleep apnea (adult) (pediatric): Secondary | ICD-10-CM | POA: Diagnosis not present

## 2015-05-23 NOTE — Telephone Encounter (Signed)
pharmancy called left message that they can not get the carbarnazepine 100mg  for the patient wants you to call and let them know what other medication she can take. spoke with britney.

## 2015-05-23 NOTE — Telephone Encounter (Signed)
The patient was discharged on carbamazepine 300 mg twice daily. The pharmacy called today to see whether they could substitute to a generic Carbatrol 300 mg capsule with the same instructions to take it twice a day. I spoke with pharmacy indicated this was reasonable. Given patient's mood stability symptoms this is an appropriate medication for her regimen.. Patient had called and asked for this refill yesterday. AW

## 2015-05-24 ENCOUNTER — Telehealth: Payer: Self-pay | Admitting: *Deleted

## 2015-05-24 MED ORDER — FUROSEMIDE 20 MG PO TABS: 10.0000 mg | ORAL_TABLET | Freq: Every day | ORAL | Status: DC | PRN

## 2015-05-24 NOTE — Telephone Encounter (Signed)
New orders: Stop hydrochlorothiazide. Start lasix 1/2 tablet daily as needed for leg swelling. Sent to pharmacy  Also have her take colchicine 0.6mg  daily if foot pain. If no better, may come in for recheck. [facility] home health form in [name]'s box.

## 2015-05-24 NOTE — Telephone Encounter (Signed)
Pt left voicemail at Triage, pt's feet have not improved at all since appt with Dr. Darnell Level. Pt said that her feet are red, swollen, and painful and she isn't sure what to do

## 2015-05-24 NOTE — Telephone Encounter (Signed)
Order form from [employer] in your IN box for signature.

## 2015-05-24 NOTE — Telephone Encounter (Signed)
Filled and in [name]'s box. 

## 2015-05-25 NOTE — Telephone Encounter (Signed)
Patient notified and verbalized understanding. 

## 2015-05-25 NOTE — Telephone Encounter (Signed)
Pt called back wanting to know what to do about her feet Best number [phone]

## 2015-05-25 NOTE — Telephone Encounter (Signed)
Form faxed to [facility]

## 2015-05-30 ENCOUNTER — Telehealth: Payer: Self-pay

## 2015-05-30 ENCOUNTER — Ambulatory Visit (INDEPENDENT_AMBULATORY_CARE_PROVIDER_SITE_OTHER): Payer: Medicare Other | Admitting: Licensed Clinical Social Worker

## 2015-05-30 DIAGNOSIS — F3112 Bipolar disorder, current episode manic without psychotic features, moderate: Secondary | ICD-10-CM | POA: Diagnosis not present

## 2015-05-30 NOTE — Telephone Encounter (Signed)
pt seen [employer] today and she told her that she had fallenl three times . ? if medication is causing patient to fall

## 2015-05-31 ENCOUNTER — Telehealth: Payer: Self-pay

## 2015-05-31 NOTE — Telephone Encounter (Signed)
Patient left a voicemail stating that she has been constipated and states that she has been taking 2 stool softeners a day & it is still not working. Patient wants to know what else she could take to help with constipation. Thanks.

## 2015-06-01 ENCOUNTER — Telehealth: Payer: Self-pay | Admitting: Family Medicine

## 2015-06-01 MED ORDER — POLYETHYLENE GLYCOL 3350 17 GM/SCOOP PO POWD: 17.0000 g | Freq: Every day | ORAL | Status: DC | PRN

## 2015-06-01 NOTE — Telephone Encounter (Signed)
Ensure no abdominal pain and passing gas.  Start miralax 1/2-1 capful daily, hold for diarrhea. Sent to pharmacy.

## 2015-06-01 NOTE — Telephone Encounter (Signed)
[facility] Patient Name: Gina [name] A DOB: 13-Jan-1951 Initial Comment Caller states she is having constipation- Half in and half out, can she take a stool softener, twice a day. She keeps falling down. She concerned about medication given to her causing this. Nurse Assessment Nurse: Mechele Dawley, RN, [name] Date/Time Eilene Ghazi Time): 06/01/2015 1:47:34 PM Confirm and document reason for call. If symptomatic, describe symptoms. ---SHE IS TAKING A STOOL SOFTENER. SHE WAS TAKING IT TWICE A DAY AND SHE WAS DOING GOOD. THE NURSE TOLD HER TO ONLY TAKE IT ONCE A DAY. SHE IS HAVING ISSUES NOW THAT SHE IS ONLY ON IT ONCE A DAY. SENNA PLUS. SHE FELL 3 TIMES OUT OF HER BED. CARBANAZEPINE 300 MG SHE WAS ON THIS FOR A WEEK. SHE IS TAKING 2 OF THESE A DAY. SHE IS NOT DIZZY. HER LEFT LEG GIVES OUT AND THEN SHE FALLS. BIPOLAR DISORDER. Has the patient traveled out of the country within the last 30 days? ---Not Applicable Does the patient have any new or worsening symptoms? ---Yes Will a triage be completed? ---Yes Related visit to physician within the last 2 weeks? ---Yes Does the PT have any chronic conditions? (i.e. diabetes, asthma, etc.) ---Yes List chronic conditions. ---bipolar Guidelines Guideline Title Affirmed Question Affirmed Notes Weakness (Generalized) and Fatigue Taking a medicine that could cause weakness (e.g., blood pressure medications, diuretics) Final Disposition User See Physician within [place], Therapist, sports, [name] Comments SHE FEELS LIKE SHE IS TAKING THE MEDICATION THAT IS CAUSING HER TO HAVE THESE FALLS. SHE STATES THAT HER LEG GIVES OUT ON HER AND SHE IS FALLING. SHE HAS BEEN ON THE NEW MEDICATION FOR ABOUT A WEEK NOW AND FEELS THAT MAY BE IT AS WELL. SHE WANTS TO KNOW ABOUT THE STOOL SOFTENER IF SHE CAN TAKE TWICE DAILY AGAIN AND ABOUT THE NEW MEDICATION IF SHE NEEDS TO STOP  TAKING IT. SHE NEEDS SOMEONE TO CALL HER BACK AND ADDRESS THESE MEDICATIONS WITH HER PLEASE NOTE:

## 2015-06-01 NOTE — Telephone Encounter (Signed)
Patient notified and verbalized understanding. She now says she has been falling frequently. She says her left leg has been getting week causing it to just give out from under her and causing her to fall. And she has also slid out of the bed a few times. Unknown if due to leg weakness or due to sheets. No trauma/injury at all. She just wanted to make you aware because she doesn't know if it's her meds. She does admit to some pain to her leg however when it gives out.

## 2015-06-01 NOTE — Telephone Encounter (Addendum)
would offer appt next week for eval.

## 2015-06-01 NOTE — Telephone Encounter (Signed)
Ok to increase senna to BID but only temporarily until she comes in to see me next week for constipation. Is she worried about carbamazepine causing falls - or newest med hctz? If carbamazepine, would suggest she decrease carbamazepine to 200mg  twice daily until seen here next week and I will forward note to psych as FYI. If hctz, ok to stop for now until eval next week in office.

## 2015-06-01 NOTE — Addendum Note (Signed)
Addended by: Ria Bush on: 06/01/2015 07:54 AM   Modules accepted: Orders

## 2015-06-02 NOTE — Telephone Encounter (Signed)
PCP is addressing patient's falls, and person his note will be seeing her next week. He  has adjusted her carbamazepine by decreasing it to 200 mg twice daily. AW

## 2015-06-03 DIAGNOSIS — F419 Anxiety disorder, unspecified: Secondary | ICD-10-CM | POA: Diagnosis not present

## 2015-06-03 DIAGNOSIS — F319 Bipolar disorder, unspecified: Secondary | ICD-10-CM | POA: Diagnosis not present

## 2015-06-03 DIAGNOSIS — N189 Chronic kidney disease, unspecified: Secondary | ICD-10-CM | POA: Diagnosis not present

## 2015-06-03 DIAGNOSIS — I129 Hypertensive chronic kidney disease with stage 1 through stage 4 chronic kidney disease, or unspecified chronic kidney disease: Secondary | ICD-10-CM | POA: Diagnosis not present

## 2015-06-04 NOTE — Telephone Encounter (Signed)
Spoke with patient. She didn't think it was her meds and will discuss it in more detail at her visit.

## 2015-06-04 NOTE — Telephone Encounter (Signed)
Spoke with patient and she is going to call and schedule appt when she is able to get transportation.

## 2015-06-05 ENCOUNTER — Ambulatory Visit (INDEPENDENT_AMBULATORY_CARE_PROVIDER_SITE_OTHER): Payer: Medicare Other | Admitting: Family Medicine

## 2015-06-05 ENCOUNTER — Encounter: Payer: Self-pay | Admitting: Family Medicine

## 2015-06-05 ENCOUNTER — Telehealth: Payer: Self-pay

## 2015-06-05 VITALS — BP 118/78 | HR 64 | Temp 98.2°F | Wt 197.5 lb

## 2015-06-05 DIAGNOSIS — Z609 Problem related to social environment, unspecified: Secondary | ICD-10-CM

## 2015-06-05 DIAGNOSIS — Z7289 Other problems related to lifestyle: Secondary | ICD-10-CM

## 2015-06-05 DIAGNOSIS — Z789 Other specified health status: Secondary | ICD-10-CM

## 2015-06-05 DIAGNOSIS — N183 Chronic kidney disease, stage 3 unspecified: Secondary | ICD-10-CM

## 2015-06-05 DIAGNOSIS — K5909 Other constipation: Secondary | ICD-10-CM

## 2015-06-05 DIAGNOSIS — F319 Bipolar disorder, unspecified: Secondary | ICD-10-CM

## 2015-06-05 DIAGNOSIS — K5900 Constipation, unspecified: Secondary | ICD-10-CM

## 2015-06-05 DIAGNOSIS — R296 Repeated falls: Secondary | ICD-10-CM

## 2015-06-05 MED ORDER — OLANZAPINE 20 MG PO TABS: 20.0000 mg | ORAL_TABLET | Freq: Every day | ORAL | Status: DC

## 2015-06-05 NOTE — Assessment & Plan Note (Addendum)
Pt feels miralax made her sick. Will stop this, ok to continue fiber supplement daily and senna PRN.

## 2015-06-05 NOTE — Assessment & Plan Note (Addendum)
Continues struggling at home, no awareness of meds she takes or whether she's taken them. Will reattempt [facility] care management referral for h/o cancer and CKD stage 3. She needs assistance with med administration and med management due to polypharmacy. Currently has run out of zyprexa early, unsure if she's taking carbamazepine as prescribed.  I have asked [name] at [facility] to call me to discuss med concerns. ?need for placement - higher level of care as unable to safely take prescribed meds.

## 2015-06-05 NOTE — Progress Notes (Addendum)
BP 118/78 mmHg  Pulse 64  Temp(Src) 98.2 F (36.8 C) (Oral)  Wt 197 lb 8 oz (89.585 kg)   CC: recurrent falls, med confusion. Subjective:    Patient ID: Gina [name], female    DOB: [birth year], 64 y.o.   MRN: [id number]  HPI: Gina [name] is a 64 y.o. female presenting on 06/05/2015 for Fall and Medication Management   Here to discuss concerns with ?med leading to falls.   She brings bottle of olanzapine 20mg , #30 filled on 05/15/2015 and she has run out - states she has one more dose at home. She doesn't know if she's taking carbamazepine. She states no one has come out to her house to help with med administration although we referred her to [facility] home health 05/23/2015.   Over phone we changed hctz to lasix due to concern for worsening gout. Pt since has stopped med due to concerns over recurrent falls. Since she stopped lasix, falls have resolved. She never felt dizzy or lightheaded. She is also not taking gout medications (allopurinol, colchicine, indocin).  Constipation - miralax made her sick. Asks about fiber supplement. States this is significantly helping.  Relevant past medical, surgical, family and social history reviewed and updated as indicated. Interim medical history since our last visit reviewed. Allergies and medications reviewed and updated. Current Outpatient Prescriptions on File Prior to Visit  Medication Sig  . azelastine (ASTELIN) 0.1 % nasal spray Place 1 spray into both nostrils 2 (two) times daily. Use in each nostril as directed (Patient taking differently: Place 1 spray into both nostrils daily. Use in each nostril as directed)  . Cholecalciferol (VITAMIN D3) 400 UNITS CAPS Take 400 Units by mouth daily.  . diclofenac sodium (VOLTAREN) 1 % GEL APPLY TO AFFECTED AREA THREE TIMES DAILY  . lovastatin (MEVACOR) 40 MG tablet Take 1 tablet (40 mg total) by mouth at bedtime.  . metoprolol tartrate (LOPRESSOR) 25 MG tablet Take 1.5 tablets (37.5  mg total) by mouth 2 (two) times daily.  . simethicone (GAS-X) 80 MG chewable tablet Chew 1 tablet (80 mg total) by mouth 4 (four) times daily as needed for flatulence.  Marland Kitchen allopurinol (ZYLOPRIM) 100 MG tablet Take 1 tablet (100 mg total) by mouth daily. (Patient not taking: Reported on 06/05/2015)  . carbamazepine (TEGRETOL XR) 100 MG 12 hr tablet Take 2 tablets by mouth 3 (three) times daily.  . clonazePAM (KLONOPIN) 0.5 MG tablet Take 1 tablet (0.5 mg total) by mouth 2 (two) times daily. (Patient not taking: Reported on 06/05/2015)  . colchicine 0.6 MG tablet Take 1 tablet (0.6 mg total) by mouth daily as needed. (Patient not taking: Reported on 06/05/2015)  . dicyclomine (BENTYL) 20 MG tablet Take 20 mg by mouth every 6 (six) hours as needed for spasms.  . fluticasone (FLONASE) 50 MCG/ACT nasal spray Place 2 sprays into both nostrils daily. (Patient not taking: Reported on 06/05/2015)  . hydrOXYzine (ATARAX/VISTARIL) 50 MG tablet Take 1 tablet by mouth daily.  . indomethacin (INDOCIN) 50 MG capsule Take 50 mg by mouth 2 (two) times daily as needed (for gout flare).   . nystatin ointment (MYCOSTATIN) Apply 1 application topically 2 (two) times daily. (Patient not taking: Reported on 06/05/2015)  . OLANZapine (ZYPREXA) 20 MG tablet Take 1 tablet (20 mg total) by mouth at bedtime.  . ondansetron (ZOFRAN) 4 MG tablet Take 4 mg by mouth every 8 (eight) hours as needed for nausea or vomiting.  Marland Kitchen PROCTOZONE-HC 2.5 % rectal  cream   . sennosides-docusate sodium (SENOKOT-S) 8.6-50 MG tablet Take 1 tablet by mouth daily as needed for constipation.   No current facility-administered medications on file prior to visit.    Review of Systems Per HPI unless specifically indicated in ROS section     Objective:    BP 118/78 mmHg  Pulse 64  Temp(Src) 98.2 F (36.8 C) (Oral)  Wt 197 lb 8 oz (89.585 kg)  Wt Readings from Last 3 Encounters:  06/05/15 197 lb 8 oz (89.585 kg)  05/22/15 197 lb 12 oz (89.699 kg)   05/18/15 194 lb 8 oz (88.225 kg)    Physical Exam  Constitutional: She appears well-developed and well-nourished. No distress.  Abdominal: Bowel sounds are normal. She exhibits distension (marked). She exhibits no mass. There is no tenderness. There is no rebound and no guarding.  Psychiatric: Thought content normal.  expansive affect  Nursing note and vitals reviewed.      Assessment & Plan:  Over 30 minutes were spent face-to-face with the patient during this encounter and >50% of that time was spent on counseling and coordination of care  Problem List Items Addressed This Visit    Recurrent falls    It sounds like falls have resolved once she stopped water pill (lasix not hctz). Will remove lasix from list.      Poor historian   High risk social situation - Primary    Continues struggling at home, no awareness of meds she takes or whether she's taken them. Will reattempt [facility] care management referral for h/o cancer and CKD stage 3. She needs assistance with med administration and med management due to polypharmacy. Currently has run out of zyprexa early, unsure if she's taking carbamazepine as prescribed.  I have asked [name] at [facility] to call me to discuss med concerns. ?need for placement - higher level of care as unable to safely take prescribed meds.      Relevant Orders   Consult to [facility] Management   Colonic constipation    Pt feels miralax made her sick. Will stop this, ok to continue fiber supplement daily and senna PRN.      CKD (chronic kidney disease) stage 3, GFR 30-59 ml/min   Relevant Orders   Consult to [place] Management   Bipolar 1 disorder ([place])    I have asked [name] at [facility] to call me to discuss med concerns. Has run out of olanzapine, doesn't know whether she's taking carbamazepine. In interim, will provide written Rx for 9 days of olanzapine 20mg  nightly to take until her next refill is due. Will route today's note to Dr Jimmye Norman.      Relevant  Orders   Consult to [place] Management       Follow up plan: Return in about 3 months (around 09/05/2015), or as needed, for follow up visit.   ADDENDUM ==> spoke with [name] at [facility] - verified dosing on carbamazepine she has been taking --> ER 300mg  BID. Changed in chart. Discussed olanzapine and how she should be taking 20mg  nightly. Persistent concern with patient's self administration of meds and confusion. I have not received communication from [facility] home health so have referred today to [facility] care management to see if they will provide RN for med administration and better communication. I think falls were due to lasix not carbamazepine - pt will continue taking 300mg  ER BID. Will route note to Dr Jimmye Norman.

## 2015-06-05 NOTE — Telephone Encounter (Signed)
amy from  [place] would like to speak with you about patient. pt has her medications all confused and she does believes pt is taking to much medications.

## 2015-06-05 NOTE — Progress Notes (Signed)
Pre visit review using our clinic review tool, if applicable. No additional management support is needed unless otherwise documented below in the visit note. 

## 2015-06-05 NOTE — Progress Notes (Signed)
   THERAPIST PROGRESS NOTE  Session Time: 64mn  Participation Level: Active  Behavioral Response: Fairly GroomedAlertAppropriate  Type of Therapy: Individual Therapy  Treatment Goals addressed: Coping  Interventions: CBT, Motivational Interviewing, Solution Focused, Supportive, Family Systems and Reframing  Summary: Gina HELDERis a 64y.o. female who presents with continued symptoms of her diagnosis.  She reports that the relationship with her sister is deterioting and she is unsure how to build moral.  She reports falling recently and unsure of why.  She reports that she has a SEducation officer, museumand a NMarine scientistwho checks on her often.  She is unsure if she is taking her medication properly.  Explored coping skills and triggers.  Provided skills task and role played.   Suicidal/Homicidal: Nowithout intent/plan  Therapist Response: Assessed mood and assisted her with listing stressors.  Explored how to communicate with her sister.  Encouraged importance of nurse and sEducation officer, museum    Plan: Return again in 2 weeks.  Diagnosis: Axis I: Bipolar    Axis II: No diagnosis    NLubertha South11/08/2014

## 2015-06-05 NOTE — Assessment & Plan Note (Addendum)
I have asked Gina [name] at [facility] to call me to discuss med concerns. Has run out of olanzapine, doesn't know whether she's taking carbamazepine. In interim, will provide written Rx for 9 days of olanzapine 20mg  nightly to take until her next refill is due. Will route today's note to Dr Jimmye Norman.

## 2015-06-05 NOTE — Telephone Encounter (Signed)
Spoke with [name], pharmacist at [facility]. She had questions about patient's medications. Patient was calling for refill of her olanzapine. Per [name] her 20 mg tablets should be lasting an additional week and a half. I clarified that the dose should be 20 mg Qhs.  Patient has probably had some previous 15 mg doses. [name] also had questions about the carbamazepine. They have per scheduled for me for 300 mg twice a day. However I share with [name] that Dr. Danise Mina had concerns that it might be contributing to falls and sent media FYI that he suggested patient decreased to 200 mg twice a day, however per [name] they never received a prescription for the lower dose of 200 mg twice a day from Dr. Danise Mina. AW

## 2015-06-05 NOTE — Assessment & Plan Note (Signed)
It sounds like falls have resolved once she stopped water pill (lasix not hctz). Will remove lasix from list.

## 2015-06-05 NOTE — Addendum Note (Signed)
Addended by: Ria Bush on: 06/05/2015 04:39 PM   Modules accepted: Orders

## 2015-06-05 NOTE — Patient Instructions (Addendum)
I have given you a prescription for 10 day's worth of olanzapine to last you until your next refill. Keep appointment with Dr Jimmye Norman later this month 11/18. Ok to take fiber supplement 1-2 capsules daily for constipation.  Stay off hydrochlorothiazide and lasix water capsule.as this may have caused your falls.  Keep your appointment with me on 11/18.  Pass by our referral coordinators to schedule home assistance with medicine administration.

## 2015-06-06 ENCOUNTER — Telehealth: Payer: Self-pay

## 2015-06-06 ENCOUNTER — Telehealth: Payer: Self-pay | Admitting: Psychiatry

## 2015-06-06 NOTE — Addendum Note (Signed)
Addended by: Ria Bush on: 06/06/2015 11:19 AM   Modules accepted: Orders

## 2015-06-06 NOTE — Patient Outreach (Signed)
[facility]) Care Management  06/06/2015  DAWNIELLE CHRISTIANA 02-21-1951 [id number]   Referral from MD via EPIC, assigned Enzo Montgomery, RN to outreach for [place] Management services.  Thanks, Ronnell Freshwater. [place], [place] Assistant Phone: [phone] Fax: [phone]

## 2015-06-06 NOTE — Telephone Encounter (Signed)
amy from [place] called wanted to give you a up date on information about pt.

## 2015-06-06 NOTE — Telephone Encounter (Signed)
Spoke with [name] at [facility]. She wanted clarification of what to do with the patient's carbamazepine. She indicated that Dr. Danise Mina was not going to write a prescription for this. There was a previous note that he had suggested decreasing the dose but it appears he was never planning on writing the prescription. I indicated that I did not want the patient to have this prescription given that there is already confusion regarding her medications and ideally she needs to be on a minimal number of medications and as reflected in today's visit note by Dr. Danise Mina that was forwarded to me patient likely needs a higher level of care for medication management and perhaps other issues. AW

## 2015-06-07 ENCOUNTER — Other Ambulatory Visit: Payer: Self-pay

## 2015-06-07 DIAGNOSIS — F319 Bipolar disorder, unspecified: Secondary | ICD-10-CM | POA: Diagnosis not present

## 2015-06-07 DIAGNOSIS — G4733 Obstructive sleep apnea (adult) (pediatric): Secondary | ICD-10-CM | POA: Diagnosis not present

## 2015-06-07 DIAGNOSIS — N189 Chronic kidney disease, unspecified: Secondary | ICD-10-CM | POA: Diagnosis not present

## 2015-06-07 DIAGNOSIS — F419 Anxiety disorder, unspecified: Secondary | ICD-10-CM | POA: Diagnosis not present

## 2015-06-07 DIAGNOSIS — I129 Hypertensive chronic kidney disease with stage 1 through stage 4 chronic kidney disease, or unspecified chronic kidney disease: Secondary | ICD-10-CM | POA: Diagnosis not present

## 2015-06-07 DIAGNOSIS — E785 Hyperlipidemia, unspecified: Secondary | ICD-10-CM | POA: Diagnosis not present

## 2015-06-07 DIAGNOSIS — I10 Essential (primary) hypertension: Secondary | ICD-10-CM

## 2015-06-07 NOTE — Patient Outreach (Signed)
[place]) Care Management  06/07/2015  LATOYNA HIRD 1950-09-27 [id number]   Request from Enzo Montgomery, RN to assign Community RN, Pharmacy and SW, assigned Kathie Rhodes, RN, Harlow Asa, PharmD and [place], [place].  Thanks, Ronnell Freshwater. [place], [place] Assistant Phone: [phone] Fax: [phone]

## 2015-06-07 NOTE — Patient Outreach (Signed)
Gina [name]) Care Management  06/07/2015  Gina [name] 02-06-1951 [id number]   Telephone Screen  Referral Date: 06/06/15 Referral Source: Dr. Ria Bush Office Referral Reason: med administration assistance, CKD stage 3, bipolar, high risk social situation, poor historian, colonic constipation, recurrent falls  Outreach call attempt # 1 to patient. Patient reached. Screening completed.  Social; Patient resides in a house with her sister(Gina [name]) who is also being followed by [facility]. Patient states she is independent with ADLs. Her sister transports her back and forth as needed. DME in the home include cane,walker and CPAP. Patient reports multiple falls in the last month(at least four per her report). She attributes it to a medication(Lasix) she was taking. She has since been taken off the med by MD and no subsequent falls.  Conditions: Patient with past medical history of  CKD stage 3, HTN, bipolar disorder. She is followed by Dr. Jimmye Norman for mental health. Patient is unsure of contact info for mental health case worker.  Appointments: She saw Primary MD on 06/05/15. She has an appt with mental health MD on 06/22/15. Patient reports that she received her flu shot last month at MD office.  Medications: Patient poor historian when it comes to addressing medication management. She states that there was supposed to be a nurse coming out to help manage her meds and fill pill box but hasn't seen anyone in two weeks. She does report that local pharmacy provided her with a pill box and filled her meds for a week. Per MD notes patient has been known to take too many pills at one time and run out of meds early.  Consents:Patient gave verbal consent for [facility] services. She also gave consent for [facility] to discuss her care with her sister-[name].   Plan: RN CM will notify [facility] administrative assistant that patient agreed to services and case opened. RN CM will  send [place] referral RN CM will send [place] referral for med review and assistance. RN CM will send [facility] SW referral for "high risk social situation" needs assessment and mental health issues/coordination. RN CM provided patient with [facility] office contact info as well as [facility] 24 hr Nurse Line number. RN CM instructed patient to contact MD for any medical changes in condition.  Enzo Montgomery, RN,BSN,CCM [place] [facility] Telephonic Care Management Coordinator Direct Phone: [phone] Toll Free: [phone] Fax: [phone]

## 2015-06-08 ENCOUNTER — Telehealth: Payer: Self-pay | Admitting: Family Medicine

## 2015-06-08 ENCOUNTER — Telehealth: Payer: Self-pay | Admitting: Psychiatry

## 2015-06-08 ENCOUNTER — Other Ambulatory Visit: Payer: Self-pay | Admitting: *Deleted

## 2015-06-08 NOTE — Telephone Encounter (Signed)
Dr.Williams is asking for Dr.Gutierrez to call him back about patient.  Dr.Williams is aware Dr.Gutierrez is off today.

## 2015-06-08 NOTE — Telephone Encounter (Signed)
Call the office of Dr. Danise Mina, the patient's primary care physician to discuss patient and any plans for home health assistance or otherwise. Left message with his office that I wanted to speak with him. AW

## 2015-06-08 NOTE — Patient Outreach (Signed)
Called pt to f/u on referral from [place] coordinator for community RN CM services.  Spoke with pt, HIPPA verified.   RN CM discussed with pt doing a home visit along with [facility] SW who was also referred to which pt agreed.  As discussed, home visit scheduled for 11/8.      Plan to f/u with pt 11/08- initial home visit.     Zara Chess.   [place] Care Management  [phone]

## 2015-06-11 NOTE — Telephone Encounter (Signed)
Left message at Dr Jimmye Norman' office

## 2015-06-11 NOTE — Telephone Encounter (Signed)
Spoke with patient's primary care physician Dr. Danise Mina. He indicated he was aware that there were limitations in the patient's social situation. He indicated he's ordered a case management evaluation. He states his schedule for tomorrow 06/12/2015. Once that is completely we can obtain their assessment and hopefully optimize patient's ability to manage medications. AW

## 2015-06-11 NOTE — Telephone Encounter (Signed)
Spoke with Dr Jimmye Norman. Pt now should be off carabamazepine, only on low dose zyprexa and klonopin per psych. Will await [facility] eval.

## 2015-06-12 ENCOUNTER — Encounter: Payer: Self-pay | Admitting: *Deleted

## 2015-06-12 ENCOUNTER — Other Ambulatory Visit: Payer: Self-pay | Admitting: *Deleted

## 2015-06-12 NOTE — Patient Outreach (Signed)
[facility]) Care Management  06/12/2015  SHAE HINNENKAMP 18-Aug-1950 [id number]   Phone call to [place] on 06/11/15 to discuss her level of involvement with patient.  Per [name], she is working towards linking patient to community resources to increase her independence and life skills.  She has referred patient to  [place] and is currently awaiting a call back.  Patient has declined referral for the [facility] all inclusive care program.  Per [name], patient is still not taking her medications correctly and refuses the blister pack.  Patient continues to see a therapist and psychiatrist.  Plan:  [name] will be present for home visit scheduled for 06/12/15 at 2:00pm.  Possible referral for Vocational Rehabilitation or and ACT team to be discussed.    Sheralyn Boatman [facility] Care Management [phone]

## 2015-06-13 ENCOUNTER — Encounter: Payer: Self-pay | Admitting: *Deleted

## 2015-06-13 ENCOUNTER — Other Ambulatory Visit: Payer: Self-pay | Admitting: Family Medicine

## 2015-06-13 NOTE — Telephone Encounter (Signed)
Touched base with Rose re patient.

## 2015-06-13 NOTE — Patient Outreach (Signed)
[facility]) Care Management   Home visit done 06/12/15  Gina [name] Jan 05, 1951 [id number]  Gina [name] is an 64 y.o. female  Subjective:   Pt reports a nurse came twice to help her with medications (filling pill planner), did not show up in 2 weeks.  Pt reports needs help with showing how to do her  medications (pill planner).   Pt reports Dr. Danise Mina took her off several meds- Lasix, told to take gout medications only as needed.  Pt states she has her Olanzapine medication, low on Clonazepam- need to call in.    Objective:   Filed Vitals:   06/12/15 1425  BP: 132/78  Pulse: 57  Resp: 28    ROS  Physical Exam  Constitutional: She is oriented to person, place, and time. She appears well-developed and well-nourished.  Cardiovascular: Regular rhythm.   Respiratory: Breath sounds normal.  GI: Soft.  Musculoskeletal: Normal range of motion.  Neurological: She is alert and oriented to person, place, and time.  Skin: Skin is warm and dry.  Psychiatric: She has a normal mood and affect. Her behavior is normal. Judgment and thought content normal.    Current Medications:  Reviewed with pt.  Current Outpatient Prescriptions  Medication Sig Dispense Refill  . azelastine (ASTELIN) 0.1 % nasal spray Place 1 spray into both nostrils 2 (two) times daily. Use in each nostril as directed (Patient taking differently: Place 1 spray into both nostrils daily. Use in each nostril as directed) 30 mL 11  . Cholecalciferol (VITAMIN D3) 400 UNITS CAPS Take 400 Units by mouth daily.    . clonazePAM (KLONOPIN) 0.5 MG tablet Take 1 tablet (0.5 mg total) by mouth 2 (two) times daily. 60 tablet 1  . diclofenac sodium (VOLTAREN) 1 % GEL APPLY TO AFFECTED AREA THREE TIMES DAILY 100 g 1  . FIBER PO Take 2 capsules by mouth daily.    Marland Kitchen lovastatin (MEVACOR) 40 MG tablet Take 1 tablet (40 mg total) by mouth at bedtime. 90 tablet 3  . metoprolol tartrate (LOPRESSOR) 25 MG  tablet Take 1.5 tablets (37.5 mg total) by mouth 2 (two) times daily. 90 tablet 11  . OLANZapine (ZYPREXA) 20 MG tablet Take 1 tablet (20 mg total) by mouth at bedtime. 9 tablet 0  . simethicone (GAS-X) 80 MG chewable tablet Chew 1 tablet (80 mg total) by mouth 4 (four) times daily as needed for flatulence. 100 tablet 0  . allopurinol (ZYLOPRIM) 100 MG tablet Take 1 tablet (100 mg total) by mouth daily. (Patient not taking: Reported on 06/05/2015) 30 tablet 6  . carbamazepine (CARBATROL) 300 MG 12 hr capsule Take 1 capsule (300 mg total) by mouth 2 (two) times daily.    . colchicine 0.6 MG tablet Take 1 tablet (0.6 mg total) by mouth daily as needed. (Patient not taking: Reported on 06/05/2015) 30 tablet 3  . dicyclomine (BENTYL) 20 MG tablet Take 20 mg by mouth every 6 (six) hours as needed for spasms.    Noelle Penner FIBER SUPPLEMENT PO Take 1-2 capsules by mouth daily.    . fluticasone (FLONASE) 50 MCG/ACT nasal spray Place 2 sprays into both nostrils daily. (Patient not taking: Reported on 06/05/2015) 16 g 6  . hydrOXYzine (ATARAX/VISTARIL) 50 MG tablet Take 1 tablet by mouth daily.    . indomethacin (INDOCIN) 50 MG capsule Take 50 mg by mouth 2 (two) times daily as needed (for gout flare).     . nystatin ointment (MYCOSTATIN) Apply  1 application topically 2 (two) times daily. (Patient not taking: Reported on 06/05/2015) 60 g 1  . OLANZapine (ZYPREXA) 20 MG tablet Take 1 tablet (20 mg total) by mouth at bedtime. (Patient not taking: Reported on 06/12/2015) 30 tablet 2  . ondansetron (ZOFRAN) 4 MG tablet Take 4 mg by mouth every 8 (eight) hours as needed for nausea or vomiting.    Marland Kitchen PROCTOZONE-HC 2.5 % rectal cream     . sennosides-docusate sodium (SENOKOT-S) 8.6-50 MG tablet Take 1 tablet by mouth daily as needed for constipation.     No current facility-administered medications for this visit.    Functional Status:   In your present state of health, do you have any difficulty performing the following  activities: 06/12/2015  Hearing? N  Vision? N  Difficulty concentrating or making decisions? N  Dressing or bathing? N  Doing errands, shopping? N  Preparing Food and eating ? Y  Using the Toilet? N  In the past six months, have you accidently leaked urine? Y  Do you have problems with loss of bowel control? N  Managing your Medications? Y  Managing your Finances? N  Housekeeping or managing your Housekeeping? N    Fall/Depression Screening:    PHQ 2/9 Scores 06/12/2015 10/11/2014 09/16/2013 08/27/2012  PHQ - 2 Score 1 0 0 1    Assessment:  Co visit done with [facility] SW.   Upon arrival, pt friendly,calm, very appreciative of RN CM doing a home visit.     Medication issue-  Per pt, MD took her off Lasix, only to take gout medication as needed.  View in [place]-                          Note from Dr. Danise Mina that Dr. Jimmye Norman discontinued carbamazepine, only to take Zyprexa and                          Clonazepam.   As requested by pt, had her  fill pill planner for one week- medications consist of 4                          Prescription Medications/ 2 OTC supplements.   RN CM called in refills on Lovastatin and Clonazepam                         To pt's pharmacy- to be delivered to pt's home.                          HTN-  Today's BP 132/78.  Education on compliance with Low Na+ diet needed- pt likes takeout [place].     Plan:   With RN CM observing/pt's request - had pt place medications in pill planner (week's worth), RN CM to f/u telephonically 11/15 to see if pt has any problems doing another week.                Plan to see pt again 11/22  As this RN CM has a scheduled home visit with sister Lelan Pons.  With hx of HTN-  Pt to monitor sodium intake, limit chinese food  Takeout.             Plan to inform Dr. Danise Mina of [facility] RN CM's involvement- sending encounter in [place], letter via                   In basket in [place].                 [facility] CM Care Plan Problem Two        Most Recent Value   Care Plan Problem Two  Medication compliance    Role Documenting the Problem Two  Care Management Coordinator   Care Plan for Problem Two  Active   Interventions for Problem Two Long Term Goal   Discussed with pt importance of taking medications as ordered, use of pill planner    [facility] Long Term Goal (31-90) days  Pt would continue to be compliant with medications for the next 60 days    [facility] Long Term Goal Start Date  06/12/15   [facility] CM Short Term Goal #1 (0-30 days)  Pt able to do pill planner without any problems within next 30 days    [facility] CM Short Term Goal #1 Start Date  06/12/15   Interventions for Short Term Goal #2   RN CM observed pt as she filled pill planner (one week), to f/u with pt in next week telephonically.     [facility] CM Care Plan Problem Three        Most Recent Value   Care Plan Problem Three  HTN- diet education/compliance    Role Documenting the Problem Three  Care Management Coordinator   Care Plan for Problem Three  Active   [facility] CM Short Term Goal #1 (0-30 days)  Pt would have better understanding of Low Na+ diet in the next 30 days    [facility] CM Short Term Goal #1 Start Date  06/12/15   Interventions for Short Term Goal #1  Discussed with pt foods to avoid that are high in sodium     Zara Chess.   [place] Care Management  [phone]

## 2015-06-13 NOTE — Patient Outreach (Signed)
[facility]) Care Management  [facility] Social Work  06/13/2015  Gina [name] [birth year] [id number]  Subjective:  Patient is a 64 year old female.  Resides with her sister.  "Gina [name] takes good care of me"  Objective:   Current Medications:  Current Outpatient Prescriptions  Medication Sig Dispense Refill  . allopurinol (ZYLOPRIM) 100 MG tablet Take 1 tablet (100 mg total) by mouth daily. (Patient not taking: Reported on 06/05/2015) 30 tablet 6  . azelastine (ASTELIN) 0.1 % nasal spray Place 1 spray into both nostrils 2 (two) times daily. Use in each nostril as directed (Patient taking differently: Place 1 spray into both nostrils daily. Use in each nostril as directed) 30 mL 11  . carbamazepine (CARBATROL) 300 MG 12 hr capsule Take 1 capsule (300 mg total) by mouth 2 (two) times daily.    . Cholecalciferol (VITAMIN D3) 400 UNITS CAPS Take 400 Units by mouth daily.    . clonazePAM (KLONOPIN) 0.5 MG tablet Take 1 tablet (0.5 mg total) by mouth 2 (two) times daily. 60 tablet 1  . colchicine 0.6 MG tablet Take 1 tablet (0.6 mg total) by mouth daily as needed. (Patient not taking: Reported on 06/05/2015) 30 tablet 3  . diclofenac sodium (VOLTAREN) 1 % GEL APPLY TO AFFECTED AREA THREE TIMES DAILY 100 g 1  . dicyclomine (BENTYL) 20 MG tablet Take 20 mg by mouth every 6 (six) hours as needed for spasms.    Noelle Penner FIBER SUPPLEMENT PO Take 1-2 capsules by mouth daily.    Marland Kitchen FIBER PO Take 2 capsules by mouth daily.    . fluticasone (FLONASE) 50 MCG/ACT nasal spray Place 2 sprays into both nostrils daily. (Patient not taking: Reported on 06/05/2015) 16 g 6  . hydrOXYzine (ATARAX/VISTARIL) 50 MG tablet Take 1 tablet by mouth daily.    . indomethacin (INDOCIN) 50 MG capsule Take 50 mg by mouth 2 (two) times daily as needed (for gout flare).     Marland Kitchen lovastatin (MEVACOR) 40 MG tablet Take 1 tablet (40 mg total) by mouth at bedtime. 90 tablet 3  . metoprolol tartrate (LOPRESSOR) 25 MG tablet  Take 1.5 tablets (37.5 mg total) by mouth 2 (two) times daily. 90 tablet 11  . nystatin ointment (MYCOSTATIN) Apply 1 application topically 2 (two) times daily. (Patient not taking: Reported on 06/05/2015) 60 g 1  . OLANZapine (ZYPREXA) 20 MG tablet Take 1 tablet (20 mg total) by mouth at bedtime. (Patient not taking: Reported on 06/12/2015) 30 tablet 2  . OLANZapine (ZYPREXA) 20 MG tablet Take 1 tablet (20 mg total) by mouth at bedtime. 9 tablet 0  . ondansetron (ZOFRAN) 4 MG tablet Take 4 mg by mouth every 8 (eight) hours as needed for nausea or vomiting.    Marland Kitchen PROCTOZONE-HC 2.5 % rectal cream     . sennosides-docusate sodium (SENOKOT-S) 8.6-50 MG tablet Take 1 tablet by mouth daily as needed for constipation.    . simethicone (GAS-X) 80 MG chewable tablet Chew 1 tablet (80 mg total) by mouth 4 (four) times daily as needed for flatulence. 100 tablet 0   No current facility-administered medications for this visit.    Functional Status:  In your present state of health, do you have any difficulty performing the following activities: 06/12/2015  Hearing? N  Vision? N  Difficulty concentrating or making decisions? N  Dressing or bathing? N  Doing errands, shopping? N  Preparing Food and eating ? Y  Using the Toilet? N  In  the past six months, have you accidently leaked urine? Y  Do you have problems with loss of bowel control? N  Managing your Medications? Y  Managing your Finances? N  Housekeeping or managing your Housekeeping? N    Fall/Depression Screening:  PHQ 2/9 Scores 06/12/2015 10/11/2014 09/16/2013 08/27/2012  PHQ - 2 Score 1 0 0 1    Assessment: Co-visit with RNCM Rose Pierzchala.  Calm, friendly demeanor.  [facility] [phone] discussed to increase socialization and to also allow respite for her sister.  Contacted Connie-Directorof the program  who stated that patient would have to make an appointment to tour the program to determine if the program would be a  good fit.  Per patient, the Adult Materials engineer [name])  had mentioned this program and stated that she would take patient there for the tour.   Patient is interested in this as well as classes at the [facility].   Patient reports being active with an outpatient Psychiatrist and Therapist.  Next appointment with Dr. Jimmye Norman on 06/15/15 at 1:15pm and with therapist on 06/14/15 at 10 am.   Plan:  This social will follow up with Adult Materials engineer regarding referral to [facility].      [place] Problem One        Most Recent Value   Care Plan Problem One  referral to day program needed   Role Documenting the Problem One  Clinical Social Worker   Care Plan for Problem One  Active   [facility] CM Short Term Goal #1 (0-30 days)  patient to be assessed for the [facility] within 30 days   [facility] CM Short Term Goal #1 Start Date  06/12/15   Interventions for Short Term Goal #1  Encouraged patient to contact Adult Materials engineer to arrange meeting with the Director of this program to assess for appropriate fit

## 2015-06-14 ENCOUNTER — Other Ambulatory Visit: Payer: Self-pay | Admitting: Pharmacist

## 2015-06-14 ENCOUNTER — Ambulatory Visit (INDEPENDENT_AMBULATORY_CARE_PROVIDER_SITE_OTHER): Payer: Medicare Other | Admitting: Licensed Clinical Social Worker

## 2015-06-14 DIAGNOSIS — F3112 Bipolar disorder, current episode manic without psychotic features, moderate: Secondary | ICD-10-CM | POA: Diagnosis not present

## 2015-06-14 NOTE — Patient Outreach (Signed)
[place] [facility]) Care Management  06/14/2015  Gina [name] [birth year] [id number]   Gina [name] is a 64yo referred to [place] for medication management.  I made initial outreach call to patient to scheduled a home visit.  There was no answer.  I left a HIPAA compliant voicemail for patient to return my phone call.  Will reach out to patient on Friday, 06/15/15 if patient does not return my call today.    Elisabeth Most, Pharm.D. Pharmacy Resident [place] [phone]

## 2015-06-14 NOTE — Progress Notes (Signed)
   THERAPIST PROGRESS NOTE  Session Time: 68min  Participation Level: Active  Behavioral Response: DisheveledAlertAppropriate  Type of Therapy: Individual Therapy  Treatment Goals addressed: Coping  Interventions: CBT, Motivational Interviewing, Solution Focused, Strength-based and Reframing  Summary: Gina [name] is a 64 y.o. female who presents with a reduction of her symptoms from her initial visit.  She does not talk randomly and stays on task.  She denies any episodes of falling and reports that a nurse comes to her home about once monthly to assist with her pill box.  She also reports that at times she forget to take medication and is unsure if her pill box is correct.   Explored options such as her sister helping with medication.  Patient is making progress and reports that she may attend a Day Program that her DSS Social Worker will assist her with  Suicidal/Homicidal: Nowithout intent/plan  Therapist Response: LCSW provided Patient with ongoing emotional support and encouragement.  Normalized her feelings.  Commended Patient on her progress and reinforced the importance of client staying focused on her own strengths and resources and resiliency. Processed various strategies for dealing with stressors.    Plan: Return again in 4 weeks.  Diagnosis: Axis I: Bipolar, mixed    Axis II: No diagnosis    [place] 06/14/2015

## 2015-06-15 ENCOUNTER — Other Ambulatory Visit: Payer: Self-pay | Admitting: Pharmacist

## 2015-06-15 ENCOUNTER — Ambulatory Visit: Payer: Self-pay | Admitting: Psychiatry

## 2015-06-15 DIAGNOSIS — N189 Chronic kidney disease, unspecified: Secondary | ICD-10-CM | POA: Diagnosis not present

## 2015-06-15 DIAGNOSIS — F419 Anxiety disorder, unspecified: Secondary | ICD-10-CM | POA: Diagnosis not present

## 2015-06-15 DIAGNOSIS — F319 Bipolar disorder, unspecified: Secondary | ICD-10-CM | POA: Diagnosis not present

## 2015-06-15 DIAGNOSIS — G4733 Obstructive sleep apnea (adult) (pediatric): Secondary | ICD-10-CM | POA: Diagnosis not present

## 2015-06-15 DIAGNOSIS — I129 Hypertensive chronic kidney disease with stage 1 through stage 4 chronic kidney disease, or unspecified chronic kidney disease: Secondary | ICD-10-CM | POA: Diagnosis not present

## 2015-06-15 DIAGNOSIS — E785 Hyperlipidemia, unspecified: Secondary | ICD-10-CM | POA: Diagnosis not present

## 2015-06-15 NOTE — Patient Outreach (Signed)
[facility]) Care Management  06/15/2015  [name] 05/06/1951 [id number]   Gina [name] is a 64yo referred to [facility] for medication management. I made second outreach call to patient to scheduled a home visit. There was no answer. I left a HIPAA compliant voicemail for patient to return my phone call. Will reach out to patient on Monday, 06/18/15 if patient does not return my call today.    Elisabeth Most, Pharm.D. Pharmacy Resident [place] [phone]

## 2015-06-18 ENCOUNTER — Other Ambulatory Visit: Payer: Self-pay | Admitting: Pharmacist

## 2015-06-18 NOTE — Patient Outreach (Signed)
[facility]) Care Management  06/18/2015  [name] [birth year] [id number]   Gina [name] is a 64yo referred to [facility] for medication management. I made an outreach call to patient to scheduled a home visit. I set up an initial home visit for Wednesday, November 16th at 9:30 [place], Pharm.D. Pharmacy Resident [place] [phone]'

## 2015-06-19 ENCOUNTER — Ambulatory Visit: Payer: Medicare Other | Admitting: *Deleted

## 2015-06-19 ENCOUNTER — Other Ambulatory Visit: Payer: Self-pay | Admitting: *Deleted

## 2015-06-19 DIAGNOSIS — I129 Hypertensive chronic kidney disease with stage 1 through stage 4 chronic kidney disease, or unspecified chronic kidney disease: Secondary | ICD-10-CM | POA: Diagnosis not present

## 2015-06-19 DIAGNOSIS — F419 Anxiety disorder, unspecified: Secondary | ICD-10-CM | POA: Diagnosis not present

## 2015-06-19 DIAGNOSIS — F319 Bipolar disorder, unspecified: Secondary | ICD-10-CM | POA: Diagnosis not present

## 2015-06-19 DIAGNOSIS — E785 Hyperlipidemia, unspecified: Secondary | ICD-10-CM | POA: Diagnosis not present

## 2015-06-19 DIAGNOSIS — G4733 Obstructive sleep apnea (adult) (pediatric): Secondary | ICD-10-CM | POA: Diagnosis not present

## 2015-06-19 DIAGNOSIS — N189 Chronic kidney disease, unspecified: Secondary | ICD-10-CM | POA: Diagnosis not present

## 2015-06-19 NOTE — Patient Outreach (Signed)
Follow up phone call:  Spoke with pt who reports filled up weekly pill planner today,realized had am and pm mixed up on one medication, corrected it.   Reviewed medications with pt- prescription, OTC.   Discussed with pt home visit with [facility] pharmacist tomorrow to which pt reports aware, can use the help.   Plan to f/u with pt 11/22- acute home visit.    Called pt back- clarified eye medications (2 listed, only taking one), bowel medication.   Medication review updated.     Gina [name].   [place] Care Management  [phone]

## 2015-06-20 ENCOUNTER — Other Ambulatory Visit: Payer: Self-pay | Admitting: Pharmacist

## 2015-06-20 ENCOUNTER — Encounter: Payer: Self-pay | Admitting: Pharmacist

## 2015-06-20 NOTE — Patient Outreach (Signed)
[facility]) Care Management  [place]   06/20/2015  ANNELLA SICARD May 16, 1951 [id number]  Subjective: Gina [name] is a 64yo referred to [facility] for medication management.  I made initial home visit today.  Patient had all medications available.  Patient uses a weekly pill box with morning and evening slots.  Patient reports she filled her pill box yesterday.  Patient filled pill box with the following medications: cholecalciferol, clonazepam, lovastatin, metoprolol tartrate, olanzapine.  Patient also reports taking fiber 2 capsules daily, azelastine nasal spray.  Patient reports she is no longer taking medications for gout (allopurinol, colchicine, indomethacin) and her provider is aware.    Patient reports filling pill box on her own.  She said she initially mixed up the morning and evening slots but caught the mistake and fixed it.  She said a nurse came out yesterday but patient reports she filled the pill box on her own without the nurses assistance.  Patient reports the use of a pill box has helped improve compliance.  She admits that she was previously mixing up her medications and may have been taking more than prescribed, but now she states she closely follows the instructions on the bottle when filling her pill box.    Patient is able to verbalize purpose and proper use of all prescribed medications.    Per review of medications not in her pill box, patient was running low on metoprolol (had 4 days remaining).  Encouraged patient to call for a refill of metoprolol at the end of the week so she would have enough medication to fill next weeks pill box.    Objective:   Current Medications: Current Outpatient Prescriptions  Medication Sig Dispense Refill  . azelastine (ASTELIN) 0.1 % nasal spray Place 1 spray into both nostrils 2 (two) times daily. Use in each nostril as directed 30 mL 11  . Cholecalciferol (VITAMIN D3) 400 UNITS CAPS Take  400 Units by mouth daily.    . diclofenac sodium (VOLTAREN) 1 % GEL APPLY TO AFFECTED AREA THREE TIMES DAILY 100 g 1  . FIBER PO Take 2 capsules by mouth daily.    Marland Kitchen lovastatin (MEVACOR) 40 MG tablet Take 1 tablet (40 mg total) by mouth at bedtime. 90 tablet 3  . metoprolol tartrate (LOPRESSOR) 25 MG tablet Take 1.5 tablets (37.5 mg total) by mouth 2 (two) times daily. 90 tablet 11  . OLANZapine (ZYPREXA) 20 MG tablet Take 1 tablet (20 mg total) by mouth at bedtime. 30 tablet 2  . OLANZapine (ZYPREXA) 20 MG tablet Take 1 tablet (20 mg total) by mouth at bedtime. 9 tablet 0  . simethicone (GAS-X) 80 MG chewable tablet Chew 1 tablet (80 mg total) by mouth 4 (four) times daily as needed for flatulence. 100 tablet 0  . allopurinol (ZYLOPRIM) 100 MG tablet Take 1 tablet (100 mg total) by mouth daily. (Patient not taking: Reported on 06/05/2015) 30 tablet 6  . clonazePAM (KLONOPIN) 0.5 MG tablet Take 1 tablet (0.5 mg total) by mouth 2 (two) times daily. 60 tablet 1  . colchicine 0.6 MG tablet Take 1 tablet (0.6 mg total) by mouth daily as needed. (Patient not taking: Reported on 06/05/2015) 30 tablet 3  . dicyclomine (BENTYL) 20 MG tablet Take 20 mg by mouth every 6 (six) hours as needed for spasms.    . fluticasone (FLONASE) 50 MCG/ACT nasal spray Place 2 sprays into both nostrils daily. (Patient not taking: Reported on 06/05/2015) 16 g  6  . hydrOXYzine (ATARAX/VISTARIL) 50 MG tablet Take 1 tablet by mouth daily.    . indomethacin (INDOCIN) 50 MG capsule Take 50 mg by mouth 2 (two) times daily as needed (for gout flare).     . nystatin ointment (MYCOSTATIN) Apply 1 application topically 2 (two) times daily. (Patient not taking: Reported on 06/05/2015) 60 g 1  . ondansetron (ZOFRAN) 4 MG tablet Take 4 mg by mouth every 8 (eight) hours as needed for nausea or vomiting.    Marland Kitchen PROCTOZONE-HC 2.5 % rectal cream     . sennosides-docusate sodium (SENOKOT-S) 8.6-50 MG tablet Take 1 tablet by mouth daily as needed for  constipation.     No current facility-administered medications for this visit.    Functional Status: In your present state of health, do you have any difficulty performing the following activities: 06/12/2015  Hearing? N  Vision? N  Difficulty concentrating or making decisions? N  Dressing or bathing? N  Doing errands, shopping? N  Preparing Food and eating ? Y  Using the Toilet? N  In the past six months, have you accidently leaked urine? Y  Do you have problems with loss of bowel control? N  Managing your Medications? Y  Managing your Finances? N  Housekeeping or managing your Housekeeping? N    Fall/Depression Screening: PHQ 2/9 Scores 06/12/2015 10/11/2014 09/16/2013 08/27/2012  PHQ - 2 Score 1 0 0 1    Assessment: 1.  Medication management:  Patient has used a pill box to organize all medications for the next week.  Reviewed contents of pill box and patient had correctly filled the pill box.  She filled pill box yesterday and has been compliant with her medications yesterday and today.  Patient appears to have understanding of all prescribed medications at this time.  She also appears to have a good understanding of how to fill pill box and reports that pill box has helped her take her medications correctly.    Plan: 1.  Patient to continue to take all medications as prescribed using a pill box to organize her medications.  2.  Patient will call pharmacy for a refill of metoprolol tartrate.   3.  Patient has follow up visit with [facility] on 06/26/15.  4.  Scheduled follow up pharmacy visit for Tuesday, 07/03/15 at [place] Problem One        Most Recent Value   Care Plan Problem One  Medication adherence   Role Documenting the Problem One  Clinical Pharmacist   Care Plan for Problem One  Active   THN CM Short Term Goal #1 (0-30 days)  Patient will take all medications as prescribed over the next two weeks per patient report and evidence of empty pill box.     THN CM Short Term Goal #1 Start Date  06/20/15   Interventions for Short Term Goal #1  Counseled patient on purpose and proper use of all medications. Reviewed pill box that patient had filled. Congratulated patient on correctly filling pill box. Encouraged patient to continue to take all medications as prescribed.      Elisabeth Most, Pharm.D. Pharmacy Resident [place] [phone]

## 2015-06-22 ENCOUNTER — Ambulatory Visit (INDEPENDENT_AMBULATORY_CARE_PROVIDER_SITE_OTHER): Payer: Medicare Other | Admitting: Psychiatry

## 2015-06-22 ENCOUNTER — Encounter: Payer: Self-pay | Admitting: Psychiatry

## 2015-06-22 ENCOUNTER — Ambulatory Visit: Payer: Medicare Other | Admitting: Family Medicine

## 2015-06-22 ENCOUNTER — Telehealth: Payer: Self-pay

## 2015-06-22 VITALS — BP 122/76 | HR 64 | Temp 98.0°F | Ht 61.0 in | Wt 201.0 lb

## 2015-06-22 DIAGNOSIS — F3110 Bipolar disorder, current episode manic without psychotic features, unspecified: Secondary | ICD-10-CM | POA: Diagnosis not present

## 2015-06-22 MED ORDER — CLONAZEPAM 0.5 MG PO TABS: 0.5000 mg | ORAL_TABLET | Freq: Two times a day (BID) | ORAL | Status: DC

## 2015-06-22 MED ORDER — OLANZAPINE 20 MG PO TABS: 20.0000 mg | ORAL_TABLET | Freq: Every day | ORAL | Status: DC

## 2015-06-22 NOTE — Telephone Encounter (Signed)
faxed rx for klonopin .9m faxed and confirmed.  ID # [id number] # [id number]

## 2015-06-22 NOTE — Progress Notes (Signed)
[facility] MD/PA/NP OP Progress Note  06/22/2015 2:20 PM Gina [name]  MRN:  [id number]  Subjective:  Patient returns for follow-up for bipolar disorder, most recent episode manic. As per telephone note I spoke about patient's need for assistance with managing medications with her primary care physician. Primary care physician has arranged a home assessment and it appears now the patient is getting some support by Education officer, museum and nurse. Today patient is much more appropriate her speech is not pressured as it was in her past few visits. She is able to focus and attend to things and even noticed a new printer and writer's office.  At this time she is on a simple regimen of olanzapine 20 mg at bedtime and clonazepam 0.5 mg twice a day. She states that she is sleeping well. She states her appetite is good and she feels like her mood is even. Chief Complaint: Doing okay Chief Complaint    Follow-up; Medication Refill; Medication Problem     Visit Diagnosis:   No diagnosis found.  Past Medical History:  Past Medical History  Diagnosis Date  . Bipolar depression [facility])     sees psychiatrist - psych admission 03/2015  . DJD (degenerative joint disease), lumbar     chronic lower back pain  . Obesity (BMI 30-39.9)   . History of colon cancer     s/p surgery  . 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 OSA on CPAP     6cm H2O  . RBBB   . Anemia in chronic kidney disease   . Blood transfusion without reported diagnosis 2009  . Cataract     left  . GERD (gastroesophageal reflux disease)   . Hyperlipidemia   . Hypertension   . History of bladder cancer 1997  . History of uterine cancer     s/p hysterectomy  . CKD (chronic kidney disease) stage 3, GFR 30-59 ml/min   . Personal history of colonic adenomas and colon cancer 11/11/2012  . IDA (iron deficiency anemia) 01/2013    thought due to h/o polyps  . Positive hepatitis C antibody test 09/2014    but negative  confirmatory testing  . Colon cancer ([place]) 1990's  . Family history of breast cancer   . Family history of colon cancer   . Family history of stomach cancer     Past Surgical History  Procedure Laterality Date  . Hernia repair  02/04/12  . Partial colectomy  about 2008    for colon cancer  . I & d abdominal wound  02/19/12  . Removal of infected mesh and abdominal wound vac placement  02/24/12  . Reexploration of abdominal wound and allograft placemet  02/26/12  . Partial hysterectomy  1981    uterine cancer, R ovary remains  . Left oophorectomy  2005  . Colonoscopy  07/2009  . Dexa  12/2009    WNL  . Dobutamine stress echo  12/2009    no evidence of ischemia  . Colonoscopy  11/2012    2 tubular adenomas, mild diverticulosis, pending genetic testing for Lynch syndrome [name]) rpt 2 yrs  . Sleep study  02/2014    OSA - AHI 55, nadir 81% [name])  . Breast biopsy Right 01/2014    fibroadenoma  . Colonoscopy  02/2015    TA, diverticulosis, rpt 2 yrs [name])   Family History:  Family History  Problem Relation Age of Onset  . Colon cancer Mother 82  .  Stomach cancer Mother     dx in her 34s?  Marland Kitchen Colon cancer Sister 42    Maternal half sister  . CAD Father     MI  . Diabetes Other     aunts and uncles both sides  . Arthritis Other     strong fmhx  . Mental illness Sister     anxiety/depression  . Breast cancer Maternal Grandmother   . Esophageal cancer Neg Hx   . Rectal cancer Neg Hx    Social History:  Social History   Social History  . Marital Status: Single    Spouse Name: N/A  . Number of Children: 0  . Years of Education: N/A   Social History Main Topics  . Smoking status: Never Smoker   . Smokeless tobacco: Never Used  . Alcohol Use: No  . Drug Use: No  . Sexual Activity: No   Other Topics Concern  . None   Social History Narrative   Lives with sister, no pets   Occupation: disabled, for bipolar and arthritis   Edu: GED   Activity: take walks    Diet: good water, vegetables daily   Religion: [place], Dr. Jimmye Norman (ph [phone])   Additional History:   Assessment:   Musculoskeletal: Strength & Muscle Tone: within normal limits Gait & Station: Patient has normal gait but ambulates slowly Patient leans: N/A  Psychiatric Specialty Exam: HPI  Review of Systems  Psychiatric/Behavioral: Negative for depression, suicidal ideas, hallucinations, memory loss and substance abuse. The patient is not nervous/anxious and does not have insomnia.   All other systems reviewed and are negative.   Blood pressure 122/76, pulse 64, temperature 98 F (36.7 C), temperature source Tympanic, height 5\' 1"  (1.549 m), weight 201 lb (91.173 kg), SpO2 96 %.Body mass index is 38 kg/(m^2).  General Appearance: Well Groomed  Eye Contact:  Good  Speech:  Normal Rate  Volume:  Normal  Mood:  Good  Affect:  Congruent  Thought Process:  Linear and Logical  Orientation:  Full (Time, Place, and Person)  Thought Content:  Negative  Suicidal Thoughts:  No  Homicidal Thoughts:  No  Memory:  Immediate;   Good Recent;   Good Remote;   Fair  Judgement:  Good  Insight:  Good  Psychomotor Activity:  Negative  Concentration:  Good  Recall:  [place] of Knowledge: Fair  Language: Good  Akathisia:  Negative  Handed:  Right unknown  AIMS (if indicated):  Normal- Done on 06/22/15 normal   Assets:  Desire for Improvement  ADL's:  Intact  Cognition: WNL  Sleep:  Good   Is the patient at risk to self?  No. Has the patient been a risk to self in the past 6 months?  No. Has the patient been a risk to self within the distant past?  No. Is the patient a risk to others?  No. Has the patient been a risk to others in the past 6 months?  No. Has the patient been a risk to others within the distant past?  No.  Current Medications: Current Outpatient Prescriptions  Medication Sig Dispense Refill  . allopurinol (ZYLOPRIM)  100 MG tablet Take 1 tablet (100 mg total) by mouth daily. 30 tablet 6  . amLODipine (NORVASC) 10 MG tablet     . amLODipine (NORVASC) 5 MG tablet     . azelastine (ASTELIN) 0.1 % nasal spray Place 1  spray into both nostrils 2 (two) times daily. Use in each nostril as directed 30 mL 11  . Cholecalciferol (VITAMIN D3) 400 UNITS CAPS Take 400 Units by mouth daily.    . clonazePAM (KLONOPIN) 0.5 MG tablet     . colchicine 0.6 MG tablet Take 1 tablet (0.6 mg total) by mouth daily as needed. 30 tablet 3  . diclofenac sodium (VOLTAREN) 1 % GEL APPLY TO AFFECTED AREA THREE TIMES DAILY 100 g 1  . dicyclomine (BENTYL) 20 MG tablet Take 20 mg by mouth every 6 (six) hours as needed for spasms.    Marland Kitchen FIBER PO Take 2 capsules by mouth daily.    . fluticasone (FLONASE) 50 MCG/ACT nasal spray Place 2 sprays into both nostrils daily. 16 g 6  . furosemide (LASIX) 20 MG tablet     . hydrochlorothiazide (MICROZIDE) 12.5 MG capsule     . hydrOXYzine (ATARAX/VISTARIL) 50 MG tablet Take 1 tablet by mouth daily.    . indomethacin (INDOCIN) 50 MG capsule Take 50 mg by mouth 2 (two) times daily as needed (for gout flare).     Marland Kitchen lovastatin (MEVACOR) 40 MG tablet Take 1 tablet (40 mg total) by mouth at bedtime. 90 tablet 3  . metoprolol tartrate (LOPRESSOR) 25 MG tablet Take 1.5 tablets (37.5 mg total) by mouth 2 (two) times daily. 90 tablet 11  . nystatin ointment (MYCOSTATIN) Apply 1 application topically 2 (two) times daily. 60 g 1  . OLANZapine (ZYPREXA) 20 MG tablet Take 1 tablet (20 mg total) by mouth at bedtime. 30 tablet 2  . ondansetron (ZOFRAN) 4 MG tablet Take 4 mg by mouth every 8 (eight) hours as needed for nausea or vomiting.    . polyethylene glycol powder (GLYCOLAX/MIRALAX) powder     . PROCTOZONE-HC 2.5 % rectal cream     . sennosides-docusate sodium (SENOKOT-S) 8.6-50 MG tablet Take 1 tablet by mouth daily as needed for constipation.    . simethicone (GAS-X) 80 MG chewable tablet Chew 1 tablet (80 mg  total) by mouth 4 (four) times daily as needed for flatulence. 100 tablet 0  . clonazePAM (KLONOPIN) 0.5 MG tablet Take 1 tablet (0.5 mg total) by mouth 2 (two) times daily. 60 tablet 2   No current facility-administered medications for this visit.    Medical Decision Making:  Established Problem, Stable/Improving (1)  Treatment Plan Summary:Medication management Patient's mood is stable she is not displaying any signs of hypomania, mania or depression. Does appear with the newly organize supports at home that perhaps adherence to medication is improved and this may correlate with her stable appearance today.  Bipolar disorder, most recent episode manic Continue Zyprexa to 20  mg daily and Klonopin 0.5 mg twice daily. Patient will return in 4 weeks.  Will have patient follow up in 1 month.  Faith Rogue 06/22/2015, 2:20 PM

## 2015-06-26 ENCOUNTER — Other Ambulatory Visit: Payer: Self-pay | Admitting: *Deleted

## 2015-06-26 DIAGNOSIS — F419 Anxiety disorder, unspecified: Secondary | ICD-10-CM | POA: Diagnosis not present

## 2015-06-26 DIAGNOSIS — N189 Chronic kidney disease, unspecified: Secondary | ICD-10-CM | POA: Diagnosis not present

## 2015-06-26 DIAGNOSIS — I129 Hypertensive chronic kidney disease with stage 1 through stage 4 chronic kidney disease, or unspecified chronic kidney disease: Secondary | ICD-10-CM | POA: Diagnosis not present

## 2015-06-26 DIAGNOSIS — F319 Bipolar disorder, unspecified: Secondary | ICD-10-CM | POA: Diagnosis not present

## 2015-06-26 DIAGNOSIS — G4733 Obstructive sleep apnea (adult) (pediatric): Secondary | ICD-10-CM | POA: Diagnosis not present

## 2015-06-26 DIAGNOSIS — E785 Hyperlipidemia, unspecified: Secondary | ICD-10-CM | POA: Diagnosis not present

## 2015-06-26 NOTE — Patient Outreach (Signed)
[facility]) Care Management   06/26/2015  Gina [name] 09/18/1950 [id number]  Gina [name] is an 64 y.o. female  Subjective:  Pt reports having no problems filling pill planner.  Pt reports  nurse from [place] came today, last visit, reviewed her medications.     Objective:   Filed Vitals:   06/26/15 1437  BP: 132/82  Pulse: 60  Resp: 20    ROS  Physical Exam  Constitutional: She is oriented to person, place, and time. She appears well-developed and well-nourished.  Cardiovascular: Regular rhythm.   Respiratory: Breath sounds normal.  GI: Soft.  Musculoskeletal: Normal range of motion.  Neurological: She is alert and oriented to person, place, and time.  Skin: Skin is warm and dry.  Psychiatric: She has a normal mood and affect. Her behavior is normal. Judgment and thought content normal.    Current Medications:  Reviewed medications with pt  Current Outpatient Prescriptions  Medication Sig Dispense Refill  . clonazePAM (KLONOPIN) 0.5 MG tablet Take 1 tablet (0.5 mg total) by mouth 2 (two) times daily. 60 tablet 2  . diclofenac sodium (VOLTAREN) 1 % GEL APPLY TO AFFECTED AREA THREE TIMES DAILY 100 g 1  . FIBER PO Take 2 capsules by mouth daily.    Marland Kitchen lovastatin (MEVACOR) 40 MG tablet Take 1 tablet (40 mg total) by mouth at bedtime. 90 tablet 3  . metoprolol tartrate (LOPRESSOR) 25 MG tablet Take 1.5 tablets (37.5 mg total) by mouth 2 (two) times daily. 90 tablet 11  . OLANZapine (ZYPREXA) 20 MG tablet Take 1 tablet (20 mg total) by mouth at bedtime. 30 tablet 2  . allopurinol (ZYLOPRIM) 100 MG tablet Take 1 tablet (100 mg total) by mouth daily. (Patient not taking: Reported on 06/26/2015) 30 tablet 6  . amLODipine (NORVASC) 10 MG tablet     . amLODipine (NORVASC) 5 MG tablet     . azelastine (ASTELIN) 0.1 % nasal spray Place 1 spray into both nostrils 2 (two) times daily. Use in each nostril as directed 30 mL 11  . Cholecalciferol  (VITAMIN D3) 400 UNITS CAPS Take 400 Units by mouth daily.    . clonazePAM (KLONOPIN) 0.5 MG tablet     . colchicine 0.6 MG tablet Take 1 tablet (0.6 mg total) by mouth daily as needed. (Patient not taking: Reported on 06/26/2015) 30 tablet 3  . dicyclomine (BENTYL) 20 MG tablet Take 20 mg by mouth every 6 (six) hours as needed for spasms.    . fluticasone (FLONASE) 50 MCG/ACT nasal spray Place 2 sprays into both nostrils daily. 16 g 6  . furosemide (LASIX) 20 MG tablet     . hydrochlorothiazide (MICROZIDE) 12.5 MG capsule     . hydrOXYzine (ATARAX/VISTARIL) 50 MG tablet Take 1 tablet by mouth daily.    . indomethacin (INDOCIN) 50 MG capsule Take 50 mg by mouth 2 (two) times daily as needed (for gout flare).     . nystatin ointment (MYCOSTATIN) Apply 1 application topically 2 (two) times daily. (Patient not taking: Reported on 06/26/2015) 60 g 1  . ondansetron (ZOFRAN) 4 MG tablet Take 4 mg by mouth every 8 (eight) hours as needed for nausea or vomiting.    . polyethylene glycol powder (GLYCOLAX/MIRALAX) powder     . PROCTOZONE-HC 2.5 % rectal cream     . sennosides-docusate sodium (SENOKOT-S) 8.6-50 MG tablet Take 1 tablet by mouth daily as needed for constipation.    . simethicone (GAS-X) 80 MG chewable  tablet Chew 1 tablet (80 mg total) by mouth 4 (four) times daily as needed for flatulence. (Patient not taking: Reported on 06/26/2015) 100 tablet 0   No current facility-administered medications for this visit.     Fall/Depression Screening:    PHQ 2/9 Scores 06/12/2015 10/11/2014 09/16/2013 08/27/2012  PHQ - 2 Score 1 0 0 1    Assessment:  HTN:  BP today 132/82.   Ongoing compliance with Low Na+ diet.                         Medication compliance:   Observed pt's pill planner filled for a week.  Pt reports no problems doing her                                                                      Own pill planners, calling in refills.   Plan:  Pt to continue to fill pill planner weekly,  call in refills as needed.            HTN- as discussed with pt, pt to read food labels for sodium content, avoid canned,packed, processed foods.            Plan to continue to follow pt with community nurse case management services, next home visit  12/8, if stable-                    Plan to discharge.              Plan to contact  Dr. Danise Mina via in basket in [place]- inquire about medication Amlodipine listed on pt's                   Medication review in Epic.    [facility] CM Care Plan Problem Two        Most Recent Value   Care Plan Problem Two  Medication compliance    Role Documenting the Problem Two  Care Management Coordinator   Care Plan for Problem Two  Active   Interventions for Problem Two Long Term Goal   Discussed with pt importance of taking medications as ordered, use of pill planner    THN Long Term Goal (31-90) days  Pt would continue to be compliant with medications for the next 60 days    THN Long Term Goal Start Date  06/12/15   THN CM Short Term Goal #1 (0-30 days)  Pt able to do pill planner without any problems within next 30 days    THN CM Short Term Goal #1 Start Date  06/12/15   [facility] CM Short Term Goal #1 Met Date   06/26/15    [facility] CM Care Plan Problem Three        Most Recent Value   Care Plan Problem Three  HTN- diet education/compliance    Role Documenting the Problem Three  Care Management Coordinator   Care Plan for Problem Three  Active   THN CM Short Term Goal #1 (0-30 days)  Pt would have better understanding of Low Na+ diet in the next 30 days    THN CM Short Term Goal #1 Start Date  06/12/15   [name] CM Short Term Goal #1  Met Date  -- [not met- ]   Interventions for Short Term Goal #1  Discussed with pt foods to avoid that are high in sodium   THN CM Short Term Goal #2 (0-30 days)  Pt would be  compliant with Low Na+ diet in the next 30 days    THN CM Short Term Goal #2 Start Date  06/26/15   Interventions for Short Term Goal #2  Discussed with pt avoid  canned foods, packed, processed foods      Gina Chess.   [place] Care Management  [phone]

## 2015-07-03 ENCOUNTER — Other Ambulatory Visit: Payer: Self-pay | Admitting: Pharmacist

## 2015-07-03 ENCOUNTER — Other Ambulatory Visit: Payer: Self-pay | Admitting: *Deleted

## 2015-07-03 NOTE — Patient Outreach (Signed)
[place] [facility]) Care Management  07/03/2015  Gina [name] [birth year] [id number]   Phone call to Department of [place] on 06/11/15 to discuss referral to the [facility] for patient.   Voicemail message left for a return call.    Sheralyn Boatman [facility] Care Management [phone]

## 2015-07-03 NOTE — Patient Outreach (Signed)
[facility] [facility]) Care Management  [place]   07/03/2015  Gina [name] 1951/05/26 [id number]  Subjective: Gina [name] is a 64yo referred to [place] for medication management.  I made a follow up home visit today.  Patient has all medications available.  Patient has refilled her prescriptions for metoprolol and olanzapine since my last visit.  She continues to use a weekly pill box with morning and evening slots.  Patient reports refilling her pill box yesterday.  Patient reports she feels comfortable filling her pill box on her own.  I reviewed the pill box filled by the patient.  Patient had correctly filled pill box with the following medications: cholecalciferol, clonazepam, lovastatin, metoprolol tartrate, and olanzapine.  Patient also reports taking fiber 2 capsules daily, and uses azelastine nasal spray and Voltaren gel.  Patient reports she is no longer taking medications for gout (allopurinol, colchicine, indomethacin) and her provider is aware.    Patient reports compliance with all medications and reports zero missed doses last week.  Patient reports understanding of her medications and is able to verbalize purpose and proper use of all prescribed medications.    Discussed steps to refill medications and patient report she feels comfortable calling the pharmacy to get refills of her medications when they are due.  Patient did express interest in transferring pharmacies to a pharmacy closer to her home in the future.  I reviewed the steps to transfer a prescription if patient chooses to transfer her prescriptions and patient voiced understanding.    Objective:   Current Medications: Current Outpatient Prescriptions  Medication Sig Dispense Refill  . azelastine (ASTELIN) 0.1 % nasal spray Place 1 spray into both nostrils 2 (two) times daily. Use in each nostril as directed 30 mL 11  . Cholecalciferol (VITAMIN D3) 400 UNITS CAPS Take 400 Units  by mouth daily.    . clonazePAM (KLONOPIN) 0.5 MG tablet     . clonazePAM (KLONOPIN) 0.5 MG tablet Take 1 tablet (0.5 mg total) by mouth 2 (two) times daily. 60 tablet 2  . diclofenac sodium (VOLTAREN) 1 % GEL APPLY TO AFFECTED AREA THREE TIMES DAILY 100 g 1  . FIBER PO Take 2 capsules by mouth daily.    Marland Kitchen lovastatin (MEVACOR) 40 MG tablet Take 1 tablet (40 mg total) by mouth at bedtime. 90 tablet 3  . metoprolol tartrate (LOPRESSOR) 25 MG tablet Take 1.5 tablets (37.5 mg total) by mouth 2 (two) times daily. 90 tablet 11  . OLANZapine (ZYPREXA) 20 MG tablet Take 1 tablet (20 mg total) by mouth at bedtime. 30 tablet 2  . allopurinol (ZYLOPRIM) 100 MG tablet Take 1 tablet (100 mg total) by mouth daily. (Patient not taking: Reported on 06/26/2015) 30 tablet 6  . colchicine 0.6 MG tablet Take 1 tablet (0.6 mg total) by mouth daily as needed. (Patient not taking: Reported on 06/26/2015) 30 tablet 3  . dicyclomine (BENTYL) 20 MG tablet Take 20 mg by mouth every 6 (six) hours as needed for spasms.    . fluticasone (FLONASE) 50 MCG/ACT nasal spray Place 2 sprays into both nostrils daily. (Patient not taking: Reported on 07/03/2015) 16 g 6  . hydrOXYzine (ATARAX/VISTARIL) 50 MG tablet Take 1 tablet by mouth daily.    . indomethacin (INDOCIN) 50 MG capsule Take 50 mg by mouth 2 (two) times daily as needed (for gout flare).     . nystatin ointment (MYCOSTATIN) Apply 1 application topically 2 (two) times daily. (Patient not taking:  Reported on 06/26/2015) 60 g 1  . ondansetron (ZOFRAN) 4 MG tablet Take 4 mg by mouth every 8 (eight) hours as needed for nausea or vomiting.    . polyethylene glycol powder (GLYCOLAX/MIRALAX) powder     . PROCTOZONE-HC 2.5 % rectal cream     . sennosides-docusate sodium (SENOKOT-S) 8.6-50 MG tablet Take 1 tablet by mouth daily as needed for constipation.    . simethicone (GAS-X) 80 MG chewable tablet Chew 1 tablet (80 mg total) by mouth 4 (four) times daily as needed for  flatulence. (Patient not taking: Reported on 06/26/2015) 100 tablet 0   No current facility-administered medications for this visit.   Functional Status: In your present state of health, do you have any difficulty performing the following activities: 06/12/2015  Hearing? N  Vision? N  Difficulty concentrating or making decisions? N  Dressing or bathing? N  Doing errands, shopping? N  Preparing Food and eating ? Y  Using the Toilet? N  In the past six months, have you accidently leaked urine? Y  Do you have problems with loss of bowel control? N  Managing your Medications? Y  Managing your Finances? N  Housekeeping or managing your Housekeeping? N   Fall/Depression Screening: PHQ 2/9 Scores 06/12/2015 10/11/2014 09/16/2013 08/27/2012  PHQ - 2 Score 1 0 0 1    Assessment: 1.  Medication management:  Patient continues to use a weekly pill box to organize all medications.  I reviewed contents of pill box that was filled yesterday and patient had correctly filled the pill box.  Patient reports compliance with all medications and denies missing any doses last week.  Patient appears to have a good understanding of all prescribed medications at this time.  She appears to have a good understanding of how to fill pill box and reports that pill box has helped her take her medications correctly.  Patient reports she is comfortable with how to obtain refills from the pharmacy when she is running low on medication.  She denies any further pharmacy needs at this time.    Plan: 1.  Patient has successfully achieved goal.  Patient will continue to fill weekly med planner, take medications as prescribed, and obtain refills of her medications from the pharmacy as needed.  Patient denies any further pharmacy needs at this time.  I will close pharmacy program.  I will alert [place] [facility] social worker [employer].     THN CM Care Plan Problem One        Most Recent Value   Care Plan  Problem One  Medication adherence   Role Documenting the Problem One  Clinical Pharmacist   Care Plan for Problem One  Active   THN CM Short Term Goal #1 (0-30 days)  Patient will take all medications as prescribed over the next two weeks per patient report and evidence of empty pill box.    THN CM Short Term Goal #1 Start Date  06/20/15   [facility] CM Short Term Goal #1 Met Date  07/03/15   Interventions for Short Term Goal #1  Patient reports compliance with prescription medications.  She had filled pill box prior to my arrival but reports no missed doses of medications in the past week.  Reviewed pill box that patient has filled and again congratulated patient on correctly filling pill box.  Encouraged patient to continue to take all medications as prescribed.      Elisabeth Most, Pharm.D. Pharmacy Resident [facility]  [employer] [phone]

## 2015-07-05 ENCOUNTER — Other Ambulatory Visit: Payer: Self-pay | Admitting: *Deleted

## 2015-07-05 NOTE — Patient Outreach (Addendum)
[place] [facility]) Care Management  07/05/2015  LINSIE LUPO February 19, 1951 [id number]   Return phone call from [place] confirming that she has closed patient's case.  She agrees with referral to the [facility] for patient.  Contact information provided in order to make referral.   Plan:  This social worker will make referral for patient to attend the [place] Management [phone]

## 2015-07-10 ENCOUNTER — Telehealth: Payer: Self-pay

## 2015-07-10 NOTE — Telephone Encounter (Signed)
Pt [facility] and pt is not sure if she should be taking amlodipine. Per 05/22/15 office note amlodipine was stopped. Pt advised not to take amlodipine and Anna at [employer] will put note in pts file amlodipine d/c and cancel refills at [place]. FYI to Dr Danise Mina.

## 2015-07-12 ENCOUNTER — Ambulatory Visit: Payer: Self-pay | Admitting: *Deleted

## 2015-07-12 ENCOUNTER — Other Ambulatory Visit: Payer: Self-pay | Admitting: *Deleted

## 2015-07-12 ENCOUNTER — Encounter: Payer: Self-pay | Admitting: *Deleted

## 2015-07-12 NOTE — Patient Outreach (Signed)
[facility]) Care Management   07/12/2015  KATHY WAHID 21-Feb-1951 [id number]  Gina [name] is an 64 y.o. female  Subjective:  [facility] and [facility] gave her some medications she is no longer on.   Pt reports she has used that pharmacy in the past.  Pt states [facility] not [facility] but her nephew can pick up  Her medications if needed.  Pt reports no problems filling her pill planner.   Pt states she started having stiffness in her hands for past couple days, comes and goes, currently no pain.    Objective:   Filed Vitals:   07/12/15 1341  BP: 124/78  Pulse: 56  Resp: 16    ROS  Physical Exam  Constitutional: She is oriented to person, place, and time. She appears well-developed and well-nourished.  Cardiovascular: Regular rhythm.   Respiratory: Breath sounds normal.  GI: Soft.  Musculoskeletal: Normal range of motion. She exhibits edema.  Trace edema top of right foot, +1 - top of left foot   Neurological: She is alert and oriented to person, place, and time.  Skin: Skin is warm and dry.  Psychiatric: She has a normal mood and affect. Her behavior is normal. Judgment and thought content normal.    Current Medications:  Reviewed with pt  Current Outpatient Prescriptions  Medication Sig Dispense Refill  . azelastine (ASTELIN) 0.1 % nasal spray Place 1 spray into both nostrils 2 (two) times daily. Use in each nostril as directed 30 mL 11  . Cholecalciferol (VITAMIN D3) 400 UNITS CAPS Take 400 Units by mouth daily.    . clonazePAM (KLONOPIN) 0.5 MG tablet Take 1 tablet (0.5 mg total) by mouth 2 (two) times daily. 60 tablet 2  . diclofenac sodium (VOLTAREN) 1 % GEL APPLY TO AFFECTED AREA THREE TIMES DAILY 100 g 1  . FIBER PO Take 2 capsules by mouth daily.    Marland Kitchen lovastatin (MEVACOR) 40 MG tablet Take 1 tablet (40 mg total) by mouth at bedtime. 90 tablet 3  . metoprolol tartrate (LOPRESSOR) 25 MG tablet Take 1.5  tablets (37.5 mg total) by mouth 2 (two) times daily. 90 tablet 11  . OLANZapine (ZYPREXA) 20 MG tablet Take 1 tablet (20 mg total) by mouth at bedtime. 30 tablet 2  . allopurinol (ZYLOPRIM) 100 MG tablet Take 1 tablet (100 mg total) by mouth daily. (Patient not taking: Reported on 06/26/2015) 30 tablet 6  . clonazePAM (KLONOPIN) 0.5 MG tablet     . colchicine 0.6 MG tablet Take 1 tablet (0.6 mg total) by mouth daily as needed. (Patient not taking: Reported on 06/26/2015) 30 tablet 3  . dicyclomine (BENTYL) 20 MG tablet Take 20 mg by mouth every 6 (six) hours as needed for spasms.    . fluticasone (FLONASE) 50 MCG/ACT nasal spray Place 2 sprays into both nostrils daily. (Patient not taking: Reported on 07/03/2015) 16 g 6  . hydrOXYzine (ATARAX/VISTARIL) 50 MG tablet Take 1 tablet by mouth daily.    . indomethacin (INDOCIN) 50 MG capsule Take 50 mg by mouth 2 (two) times daily as needed (for gout flare).     . nystatin ointment (MYCOSTATIN) Apply 1 application topically 2 (two) times daily. (Patient not taking: Reported on 06/26/2015) 60 g 1  . ondansetron (ZOFRAN) 4 MG tablet Take 4 mg by mouth every 8 (eight) hours as needed for nausea or vomiting.    . polyethylene glycol powder (GLYCOLAX/MIRALAX) powder     .  PROCTOZONE-HC 2.5 % rectal cream     . sennosides-docusate sodium (SENOKOT-S) 8.6-50 MG tablet Take 1 tablet by mouth daily as needed for constipation.    . simethicone (GAS-X) 80 MG chewable tablet Chew 1 tablet (80 mg total) by mouth 4 (four) times daily as needed for flatulence. (Patient not taking: Reported on 06/26/2015) 100 tablet 0   No current facility-administered medications for this visit.    Functional Status:   In your present state of health, do you have any difficulty performing the following activities: 06/12/2015  Hearing? N  Vision? N  Difficulty concentrating or making decisions? N  Dressing or bathing? N  Doing errands, shopping? N  Preparing Food and eating ? Y   Using the Toilet? N  In the past six months, have you accidently leaked urine? Y  Do you have problems with loss of bowel control? N  Managing your Medications? Y  Managing your Finances? N  Housekeeping or managing your Housekeeping? N    Fall/Depression Screening:    PHQ 2/9 Scores 06/12/2015 10/11/2014 09/16/2013 08/27/2012  PHQ - 2 Score 1 0 0 1    Assessment:   HTN- BP today 124/78.   Pt discussed foods to avoid that are high in sodium.                             Medication compliance- view of pt's pill planner, noted pt was missing the one half tablet of Metoprolol                             For one of her pm days to which pt corrected during home visit.   Plan:  Pt to continue to take all medications as ordered by MD,  fill pill planner weekly.              Pt to review medications to see if refills needed- call early.              Pt to continue to comply with Low Na+ diet.            Plan to discharge pt from RN CM services + let  [facility] CSW know about discharge.             Plan to inform Dr. Danise Mina of RN CM discharge-  By routing encounter and update letter by                     In basket in [place].    THN CM Care Plan Problem Three        Most Recent Value   Care Plan Problem Three  HTN- diet education/compliance    Role Documenting the Problem Three  Care Management Coordinator   Care Plan for Problem Three  Active   THN CM Short Term Goal #1 (0-30 days)  Pt would have better understanding of Low Na+ diet in the next 30 days    THN CM Short Term Goal #1 Start Date  06/12/15   [facility] CM Short Term Goal #1 Met Date  07/12/15 [pt was able to voice foods high in salt to avoid]   Interventions for Short Term Goal #1  Discussed with pt foods to avoid that are high in sodium   THN CM Short Term Goal #2 (0-30 days)  Pt would be  compliant with Low Na+ diet in the  next 30 days    THN CM Short Term Goal #2 Start Date  06/26/15   [facility] CM Short Term Goal #2 Met Date  07/12/15 [Pt  reports avoiding foods high in sodium, BP today 124/78]   Interventions for Short Term Goal #2  Discussed with pt avoid canned foods, packed, processed foods      Zara Chess.   [place] Care Management  [phone]

## 2015-07-13 ENCOUNTER — Ambulatory Visit (INDEPENDENT_AMBULATORY_CARE_PROVIDER_SITE_OTHER): Payer: Medicare Other | Admitting: Licensed Clinical Social Worker

## 2015-07-13 DIAGNOSIS — F3110 Bipolar disorder, current episode manic without psychotic features, unspecified: Secondary | ICD-10-CM | POA: Diagnosis not present

## 2015-07-16 ENCOUNTER — Encounter: Payer: Self-pay | Admitting: *Deleted

## 2015-07-16 ENCOUNTER — Other Ambulatory Visit: Payer: Self-pay | Admitting: *Deleted

## 2015-07-16 NOTE — Patient Outreach (Addendum)
[place] [facility]) Care Management  [facility] Social Work  07/16/2015  Gina [name] June 25, 1951 [id number]  Subjective:  Patient is a 64 year old female, who states that she is interested in joining the [facility] to increase her exercise.  Objective:   Current Medications:  Current Outpatient Prescriptions  Medication Sig Dispense Refill  . allopurinol (ZYLOPRIM) 100 MG tablet Take 1 tablet (100 mg total) by mouth daily. (Patient not taking: Reported on 06/26/2015) 30 tablet 6  . azelastine (ASTELIN) 0.1 % nasal spray Place 1 spray into both nostrils 2 (two) times daily. Use in each nostril as directed 30 mL 11  . Cholecalciferol (VITAMIN D3) 400 UNITS CAPS Take 400 Units by mouth daily.    . clonazePAM (KLONOPIN) 0.5 MG tablet     . clonazePAM (KLONOPIN) 0.5 MG tablet Take 1 tablet (0.5 mg total) by mouth 2 (two) times daily. 60 tablet 2  . colchicine 0.6 MG tablet Take 1 tablet (0.6 mg total) by mouth daily as needed. (Patient not taking: Reported on 06/26/2015) 30 tablet 3  . diclofenac sodium (VOLTAREN) 1 % GEL APPLY TO AFFECTED AREA THREE TIMES DAILY 100 g 1  . dicyclomine (BENTYL) 20 MG tablet Take 20 mg by mouth every 6 (six) hours as needed for spasms.    Marland Kitchen FIBER PO Take 2 capsules by mouth daily.    . fluticasone (FLONASE) 50 MCG/ACT nasal spray Place 2 sprays into both nostrils daily. (Patient not taking: Reported on 07/03/2015) 16 g 6  . hydrOXYzine (ATARAX/VISTARIL) 50 MG tablet Take 1 tablet by mouth daily.    . indomethacin (INDOCIN) 50 MG capsule Take 50 mg by mouth 2 (two) times daily as needed (for gout flare).     Marland Kitchen lovastatin (MEVACOR) 40 MG tablet Take 1 tablet (40 mg total) by mouth at bedtime. 90 tablet 3  . metoprolol tartrate (LOPRESSOR) 25 MG tablet Take 1.5 tablets (37.5 mg total) by mouth 2 (two) times daily. 90 tablet 11  . nystatin ointment (MYCOSTATIN) Apply 1 application topically 2 (two) times daily. (Patient not taking: Reported on 06/26/2015)  60 g 1  . OLANZapine (ZYPREXA) 20 MG tablet Take 1 tablet (20 mg total) by mouth at bedtime. 30 tablet 2  . ondansetron (ZOFRAN) 4 MG tablet Take 4 mg by mouth every 8 (eight) hours as needed for nausea or vomiting.    . polyethylene glycol powder (GLYCOLAX/MIRALAX) powder     . PROCTOZONE-HC 2.5 % rectal cream     . sennosides-docusate sodium (SENOKOT-S) 8.6-50 MG tablet Take 1 tablet by mouth daily as needed for constipation.    . simethicone (GAS-X) 80 MG chewable tablet Chew 1 tablet (80 mg total) by mouth 4 (four) times daily as needed for flatulence. (Patient not taking: Reported on 06/26/2015) 100 tablet 0   No current facility-administered medications for this visit.    Functional Status:  In your present state of health, do you have any difficulty performing the following activities: 06/12/2015  Hearing? N  Vision? N  Difficulty concentrating or making decisions? N  Dressing or bathing? N  Doing errands, shopping? N  Preparing Food and eating ? Y  Using the Toilet? N  In the past six months, have you accidently leaked urine? Y  Do you have problems with loss of bowel control? N  Managing your Medications? Y  Managing your Finances? N  Housekeeping or managing your Housekeeping? N    Fall/Depression Screening:  [facility] 2/9 Scores 06/12/2015 10/11/2014 09/16/2013 08/27/2012  PHQ - 2 Score 1 0 0 1    Assessment:  Home visit to patient's home.  Patient discussed having pain in the left knee, has spoken with her doctor who recommends taking tylenol.  Patient declines referral to the [facility], however is interested in joining the [facility]. for exercise and to increase her socialization.,   Contact information provided [place], [place] [address] [phone] $29.00 to join and 27.00 a month.  Patient also interested in improving her literacy.  Phone call made to Micheline Chapman [phone], message left for a return call regarding classes offered at [facility] Plan:  This Education officer, museum will assist patient in signing up for literacy classes at [employer].

## 2015-07-17 NOTE — Progress Notes (Signed)
   THERAPIST PROGRESS NOTE  Session Time: 38min  Participation Level: Active  Behavioral Response: CasualAlertAnxious  Type of Therapy: Individual Therapy  Treatment Goals addressed: Coping  Interventions: CBT, Motivational Interviewing, Solution Focused, Strength-based, Supportive, Family Systems and Reframing  Summary: Gina [name] is a 64 y.o. female who presents with continued symptoms of her diagnosis.  She reports that she takes medication as directed daily.  She denies in home nursing and social work services.  She denies a Psychosocial Rehab services and other agency involvement.  She reports that she does not get angry or aggressive towards others and she sleeps throughout the night.  She reports that she has a good appetite.  She reports eating a lot of fast food since her sister has been sick.  She reports being social in her neighborhood and denies falling in the past month.  She reports that she is beginning to have a routine/schedule.  Explored coping skills and having a routine.  Discussion of continuing to have a good sleep regimen, appetite and taking medications as prescribed.  Suicidal/Homicidal: Nowithout intent/plan  Therapist Response: LCSW provided Patient with ongoing emotional support and encouragement.  Normalized her feelings.  Commended Patient on her progress and reinforced the importance of client staying focused on her own strengths and resources and resiliency. Processed various strategies for dealing with stressors.    Plan: Return again in 2 weeks.  Diagnosis: Axis I: Bipolar, Manic    Axis II: No diagnosis    [place] 07/17/2015

## 2015-07-23 ENCOUNTER — Other Ambulatory Visit: Payer: Self-pay

## 2015-07-23 NOTE — Telephone Encounter (Signed)
Pt [facility] [address]. Last filled # 100 g x 1 on 05/22/15. Last seen 06/05/15.Please advise.

## 2015-07-24 MED ORDER — DICLOFENAC SODIUM 1 % TD GEL: TRANSDERMAL | Status: DC

## 2015-07-25 ENCOUNTER — Ambulatory Visit (INDEPENDENT_AMBULATORY_CARE_PROVIDER_SITE_OTHER): Payer: Medicare Other | Admitting: Psychiatry

## 2015-07-25 ENCOUNTER — Encounter: Payer: Self-pay | Admitting: Psychiatry

## 2015-07-25 VITALS — BP 122/76 | HR 67 | Temp 98.2°F | Ht 61.0 in | Wt 204.4 lb

## 2015-07-25 DIAGNOSIS — F3110 Bipolar disorder, current episode manic without psychotic features, unspecified: Secondary | ICD-10-CM

## 2015-07-25 MED ORDER — OLANZAPINE 20 MG PO TABS: 20.0000 mg | ORAL_TABLET | Freq: Every day | ORAL | Status: DC

## 2015-07-25 MED ORDER — CLONAZEPAM 0.5 MG PO TABS: 0.5000 mg | ORAL_TABLET | Freq: Two times a day (BID) | ORAL | Status: DC

## 2015-07-25 NOTE — Progress Notes (Signed)
BH MD/PA/NP OP Progress Note  07/25/2015 1:35 PM Gina [name]  MRN:  [id number]  Subjective:  Patient returns for follow-up for bipolar disorder, most recent episode manic. Patient indicates her mood has been overall good. She does states she got some bad news today that an auto indicated that on is unlikely to survive cancer. However patient does state the aunt is 65 years old and feels that the aunt has outlived many other relatives. Patient states she's sleeping okay and eating okay. She denies any problems from the medications but asked whether she could skip her morning clonazepam. I asked her why and she stated that she thought I would indicated that medication could cause her pain in her legs. I told her that was not the case. She did states she sleeps during the day and I indicated that certainly if she was sleepy in the morning she could skip that morning clonazepam.  I have reviewed the chart and appears the patient was getting some support in the home such as some visiting nursing as well as it appears that there is social work that is helping her with social issues as well. It does appear these things have been helpful. The notes do note that the nursing support at home has potentially been discontinued. We will have to continue to observe patient given this decrease in support which may have been helping her with her current stable clinical situation. Chief Complaint: Okay Chief Complaint    Follow-up; Medication Refill     Visit Diagnosis:     ICD-9-CM ICD-10-CM   1. Bipolar I disorder, most recent episode (or current) manic ([place]) 296.40 F31.10     Past Medical History:  Past Medical History  Diagnosis Date  . Bipolar depression [facility])     sees psychiatrist - psych admission 03/2015  . DJD (degenerative joint disease), lumbar     chronic lower back pain  . Obesity (BMI 30-39.9)   . History of colon cancer     s/p surgery  . Enterocutaneous fistula 04/07/2012     completed PT 06/2012 ([facility])  . OSA on CPAP     6cm H2O  . RBBB   . Anemia in chronic kidney disease   . Blood transfusion without reported diagnosis 2009  . Cataract     left  . GERD (gastroesophageal reflux disease)   . Hyperlipidemia   . Hypertension   . History of bladder cancer 1997  . History of uterine cancer     s/p hysterectomy  . CKD (chronic kidney disease) stage 3, GFR 30-59 ml/min   . Personal history of colonic adenomas and colon cancer 11/11/2012  . IDA (iron deficiency anemia) 01/2013    thought due to h/o polyps  . Positive hepatitis C antibody test 09/2014    but negative confirmatory testing  . Colon cancer ([place]) 1990's  . Family history of breast cancer   . Family history of colon cancer   . Family history of stomach cancer     Past Surgical History  Procedure Laterality Date  . Hernia repair  02/04/12  . Partial colectomy  about 2008    for colon cancer  . I & d abdominal wound  02/19/12  . Removal of infected mesh and abdominal wound vac placement  02/24/12  . Reexploration of abdominal wound and allograft placemet  02/26/12  . Partial hysterectomy  1981    uterine cancer, R ovary remains  . Left oophorectomy  2005  . Colonoscopy  07/2009  . Dexa  12/2009    WNL  . Dobutamine stress echo  12/2009    no evidence of ischemia  . Colonoscopy  11/2012    2 tubular adenomas, mild diverticulosis, pending genetic testing for Lynch syndrome [name]) rpt 2 yrs  . Sleep study  02/2014    OSA - AHI 55, nadir 81% [name])  . Breast biopsy Right 01/2014    fibroadenoma  . Colonoscopy  02/2015    TA, diverticulosis, rpt 2 yrs [name])   Family History:  Family History  Problem Relation Age of Onset  . Colon cancer Mother 22  . Stomach cancer Mother     dx in her 69s?  Marland Kitchen Colon cancer Sister 74    Maternal half sister  . CAD Father     MI  . Diabetes Other     aunts and uncles both sides  . Arthritis Other     strong fmhx  . Mental illness Sister      anxiety/depression  . Breast cancer Maternal Grandmother   . Esophageal cancer Neg Hx   . Rectal cancer Neg Hx    Social History:  Social History   Social History  . Marital Status: Single    Spouse Name: N/A  . Number of Children: 0  . Years of Education: N/A   Social History Main Topics  . Smoking status: Never Smoker   . Smokeless tobacco: Never Used  . Alcohol Use: No  . Drug Use: No  . Sexual Activity: No   Other Topics Concern  . None   Social History Narrative   Lives with sister, no pets   Occupation: disabled, for bipolar and arthritis   Edu: GED   Activity: take walks   Diet: good water, vegetables daily   Religion: [place], Dr. Jimmye Norman (ph [phone])   Additional History:   Assessment:   Musculoskeletal: Strength & Muscle Tone: within normal limits Gait & Station: Patient has normal gait but ambulates slowly Patient leans: N/A  Psychiatric Specialty Exam: HPI  Review of Systems  Psychiatric/Behavioral: Negative for depression, suicidal ideas, hallucinations, memory loss and substance abuse. The patient is not nervous/anxious and does not have insomnia.   All other systems reviewed and are negative.   Blood pressure 122/76, pulse 67, temperature 98.2 F (36.8 C), temperature source Tympanic, height 5\' 1"  (1.549 m), weight 204 lb 6.4 oz (92.715 kg), SpO2 94 %.Body mass index is 38.64 kg/(m^2).  General Appearance: Well Groomed  Eye Contact:  Good  Speech:  Normal Rate  Volume:  Normal  Mood:  Good  Affect:  Congruent  Thought Process:  Linear and Logical  Orientation:  Full (Time, Place, and Person)  Thought Content:  Negative  Suicidal Thoughts:  No  Homicidal Thoughts:  No  Memory:  Immediate;   Good Recent;   Good Remote;   Fair  Judgement:  Good  Insight:  Good  Psychomotor Activity:  Negative  Concentration:  Good  Recall:  [place] of Knowledge: Fair  Language: Good  Akathisia:  Negative   Handed:  Right unknown  AIMS (if indicated):  Normal- Done on 06/22/15 normal   Assets:  Desire for Improvement  ADL's:  Intact  Cognition: WNL  Sleep:  Good   Is the patient at risk to self?  No. Has the patient been a risk to self in the past 6 months?  No. Has the  patient been a risk to self within the distant past?  No. Is the patient a risk to others?  No. Has the patient been a risk to others in the past 6 months?  No. Has the patient been a risk to others within the distant past?  No.  Current Medications: Current Outpatient Prescriptions  Medication Sig Dispense Refill  . allopurinol (ZYLOPRIM) 100 MG tablet Take 1 tablet (100 mg total) by mouth daily. 30 tablet 6  . azelastine (ASTELIN) 0.1 % nasal spray Place 1 spray into both nostrils 2 (two) times daily. Use in each nostril as directed 30 mL 11  . Cholecalciferol (VITAMIN D3) 400 UNITS CAPS Take 400 Units by mouth daily.    . clonazePAM (KLONOPIN) 0.5 MG tablet     . clonazePAM (KLONOPIN) 0.5 MG tablet Take 1 tablet (0.5 mg total) by mouth 2 (two) times daily. 60 tablet 4  . colchicine 0.6 MG tablet Take 1 tablet (0.6 mg total) by mouth daily as needed. 30 tablet 3  . diclofenac sodium (VOLTAREN) 1 % GEL APPLY TO AFFECTED AREA THREE TIMES DAILY 100 g 1  . dicyclomine (BENTYL) 20 MG tablet Take 20 mg by mouth every 6 (six) hours as needed for spasms.    Marland Kitchen FIBER PO Take 2 capsules by mouth daily.    . fluticasone (FLONASE) 50 MCG/ACT nasal spray Place 2 sprays into both nostrils daily. 16 g 6  . hydrOXYzine (ATARAX/VISTARIL) 50 MG tablet Take 1 tablet by mouth daily.    . indomethacin (INDOCIN) 50 MG capsule Take 50 mg by mouth 2 (two) times daily as needed (for gout flare).     Marland Kitchen lovastatin (MEVACOR) 40 MG tablet Take 1 tablet (40 mg total) by mouth at bedtime. 90 tablet 3  . metoprolol tartrate (LOPRESSOR) 25 MG tablet Take 1.5 tablets (37.5 mg total) by mouth 2 (two) times daily. 90 tablet 11  . nystatin ointment  (MYCOSTATIN) Apply 1 application topically 2 (two) times daily. 60 g 1  . OLANZapine (ZYPREXA) 20 MG tablet Take 1 tablet (20 mg total) by mouth at bedtime. 30 tablet 4  . ondansetron (ZOFRAN) 4 MG tablet Take 4 mg by mouth every 8 (eight) hours as needed for nausea or vomiting.    . polyethylene glycol powder (GLYCOLAX/MIRALAX) powder     . PROCTOZONE-HC 2.5 % rectal cream     . sennosides-docusate sodium (SENOKOT-S) 8.6-50 MG tablet Take 1 tablet by mouth daily as needed for constipation.    . simethicone (GAS-X) 80 MG chewable tablet Chew 1 tablet (80 mg total) by mouth 4 (four) times daily as needed for flatulence. 100 tablet 0   No current facility-administered medications for this visit.    Medical Decision Making:  Established Problem, Stable/Improving (1)  Treatment Plan Summary:Medication management Patient's mood is stable she is not displaying any signs of hypomania, mania or depression. She is doing good and this is likely due to improve supports in the home. However review of the chart indicate that some home nursing service may have been discontinued and thus would have to observe for any change in her clinical status which at this time is good.  Bipolar disorder, most recent episode manic Continue Zyprexa to 20  mg daily and Klonopin 0.5 mg twice daily. Patient will return in 4 weeks. Patient's weight today was 204 pounds, on 06/22/2015 was 201 pounds and on 06/05/2015 was 197 pounds. It is possible that Zyprexa is having some effect on her weight. However  review of the home nurse support services due to document some poor diet secondary to patient perhaps not having food available at home due to patient's sister being ill. I do not want to change the patient's regimen right now is the documentation indicates that the home services have changed and patient may have difficulty with transition to another medication without these services. In addition writer is going to be departing the  clinic and patient is actually very stable at this time. Changing the medication at this time given my departure might not be an optimal time.   Will have patient follow up in 1 month. I've explained my departure from the practice and that another doctor within [facility] will be able to assume her care.  Faith Rogue 07/25/2015, 1:35 PM

## 2015-07-31 ENCOUNTER — Other Ambulatory Visit: Payer: Self-pay

## 2015-07-31 ENCOUNTER — Ambulatory Visit: Payer: Medicare Other | Admitting: Psychiatry

## 2015-07-31 MED ORDER — LOVASTATIN 40 MG PO TABS: 40.0000 mg | ORAL_TABLET | Freq: Every day | ORAL | Status: DC

## 2015-07-31 NOTE — Telephone Encounter (Signed)
Pt left v/m requesting refill lovastatin to [facility]. Caller did not want cb. Refill done x 1 with note pt needs to cb for appt. Last lipid 08/2014.

## 2015-07-31 NOTE — Progress Notes (Signed)
Refilled still take

## 2015-08-02 ENCOUNTER — Other Ambulatory Visit: Payer: Self-pay | Admitting: Family Medicine

## 2015-08-13 ENCOUNTER — Ambulatory Visit: Payer: Medicare Other | Admitting: Licensed Clinical Social Worker

## 2015-08-21 ENCOUNTER — Other Ambulatory Visit: Payer: Self-pay | Admitting: Family Medicine

## 2015-08-21 ENCOUNTER — Ambulatory Visit (INDEPENDENT_AMBULATORY_CARE_PROVIDER_SITE_OTHER): Payer: Medicare Other | Admitting: Licensed Clinical Social Worker

## 2015-08-21 DIAGNOSIS — F3112 Bipolar disorder, current episode manic without psychotic features, moderate: Secondary | ICD-10-CM

## 2015-08-22 ENCOUNTER — Encounter: Payer: Self-pay | Admitting: Family Medicine

## 2015-08-22 ENCOUNTER — Ambulatory Visit (INDEPENDENT_AMBULATORY_CARE_PROVIDER_SITE_OTHER): Payer: Medicare Other | Admitting: Family Medicine

## 2015-08-22 VITALS — BP 112/70 | HR 68 | Temp 98.5°F | Ht 61.0 in | Wt 204.5 lb

## 2015-08-22 DIAGNOSIS — M1712 Unilateral primary osteoarthritis, left knee: Secondary | ICD-10-CM | POA: Diagnosis not present

## 2015-08-22 DIAGNOSIS — M25562 Pain in left knee: Secondary | ICD-10-CM

## 2015-08-22 MED ORDER — METHYLPREDNISOLONE ACETATE 40 MG/ML IJ SUSP
80.0000 mg | Freq: Once | INTRAMUSCULAR | Status: AC
  Administered 2015-08-22: 80 mg via INTRA_ARTICULAR

## 2015-08-22 NOTE — Progress Notes (Signed)
Dr. Frederico Hamman T. Aleysia Oltmann, MD, [facility] [place], [address] Phone: [phone] Fax: [phone]  08/22/2015  Patient: Gina [name], MRN: [id number], DOB: April 20, 1951, 65 y.o.  Primary Physician:  Ria Bush, MD   Chief Complaint  Patient presents with  . Knee Pain    Bilateral   Subjective:   Gina [name] is a 65 y.o. very pleasant female patient who presents with the following:  R knee was popping last week and left knee is hurting more.   Overall, the patient has known osteoarthritis, worse on the left, in known severe degenerative joint disease on the left on prior films. She is having pain both in her left and her right, and she is having some instability on the right as well. She is having some limitation in motion and pain bilaterally  Past Medical History, Surgical History, Social History, Family History, Problem List, Medications, and Allergies have been reviewed and updated if relevant.  Patient Active Problem List   Diagnosis Date Noted  . Pedal edema 05/18/2015  . Poor historian 05/18/2015  . High risk social situation 05/10/2015  . Nasal laceration 04/20/2015  . Acute on chronic kidney failure ([place]) 03/30/2015  . Acute-on-chronic renal failure ([place]) 03/30/2015  . Calculus of gallbladder 03/28/2015  . NASH (nonalcoholic steatohepatitis) 03/28/2015  . Gout 03/27/2015  . HCV antibody positive 03/27/2015  . History of neoplasm of bladder 03/27/2015  . History of prolonged Q-T interval on ECG 03/27/2015  . Elevated fasting blood sugar 03/27/2015  . Colonic constipation 03/27/2015  . Elevation of level of transaminase or lactic acid dehydrogenase (LDH) 03/27/2015  . Personal history of other diseases of the circulatory system 03/27/2015  . Genetic testing 03/21/2015  . Family history of breast cancer   . Family history of colon cancer   . Family history of stomach cancer   .  History of anemia 11/27/2014  . Bladder cancer ([place]) 11/27/2014  . History of urinary anomaly 11/27/2014  . Uterine cancer ([place]) 11/27/2014  . Allergic rhinitis 11/03/2014  . Contact dermatitis 11/03/2014  . Advanced care planning/counseling discussion 10/11/2014  . Memory deficit 09/13/2014  . Positive hepatitis C antibody test 09/04/2014  . Abnormal mental state 09/04/2014  . Amnesia 09/04/2014  . Thrombocytopenia ([place]) 08/12/2014  . Prediabetes 06/07/2014  . HTN (hypertension) 06/07/2014  . Trouble in sleeping 05/10/2014  . Right knee pain 03/16/2014  . Sleep apnea 02/13/2014  . Hyperlipidemia   . IDA (iron deficiency anemia) 01/26/2013  . History of colonic polyps 11/11/2012  . Recurrent falls 10/02/2012  . Nasal congestion 10/02/2012  . Medicare annual wellness visit, subsequent 08/27/2012  . Urine incontinence 08/27/2012  . Therapeutic drug monitoring 08/16/2012  . Recurrent ventral hernia 08/11/2012  . OSA on CPAP   . Intertrigo 05/27/2012  . CKD (chronic kidney disease) stage 3, GFR 30-59 ml/min   . Obesity (BMI 30-39.9)   . Colon cancer ([place]) 04/07/2012  . 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 Bipolar 1 disorder ([place]) 04/07/2012  . H/O malignant neoplasm of rectum, rectosigmoid junction, and anus 08/05/2011    Past Medical History  Diagnosis Date  . Bipolar depression [facility])     sees psychiatrist - psych admission 03/2015  . DJD (degenerative joint disease), lumbar     chronic lower back pain  . Obesity (BMI 30-39.9)   . History of colon cancer     s/p surgery  . Enterocutaneous fistula 04/07/2012  completed PT 06/2012 ([facility])  . OSA on CPAP     6cm H2O  . RBBB   . Anemia in chronic kidney disease   . Blood transfusion without reported diagnosis 2009  . Cataract     left  . GERD (gastroesophageal reflux disease)   . Hyperlipidemia   . Hypertension   . History of bladder cancer 1997  . History of uterine cancer     s/p hysterectomy  . CKD (chronic  kidney disease) stage 3, GFR 30-59 ml/min   . Personal history of colonic adenomas and colon cancer 11/11/2012  . IDA (iron deficiency anemia) 01/2013    thought due to h/o polyps  . Positive hepatitis C antibody test 09/2014    but negative confirmatory testing  . Colon cancer ([place]) 1990's  . Family history of breast cancer   . Family history of colon cancer   . Family history of stomach cancer     Past Surgical History  Procedure Laterality Date  . Hernia repair  02/04/12  . Partial colectomy  about 2008    for colon cancer  . I & d abdominal wound  02/19/12  . Removal of infected mesh and abdominal wound vac placement  02/24/12  . Reexploration of abdominal wound and allograft placemet  02/26/12  . Partial hysterectomy  1981    uterine cancer, R ovary remains  . Left oophorectomy  2005  . Colonoscopy  07/2009  . Dexa  12/2009    WNL  . Dobutamine stress echo  12/2009    no evidence of ischemia  . Colonoscopy  11/2012    2 tubular adenomas, mild diverticulosis, pending genetic testing for Lynch syndrome [name]) rpt 2 yrs  . Sleep study  02/2014    OSA - AHI 55, nadir 81% [name])  . Breast biopsy Right 01/2014    fibroadenoma  . Colonoscopy  02/2015    TA, diverticulosis, rpt 2 yrs [name])    Social History   Social History  . Marital Status: Single    Spouse Name: N/A  . Number of Children: 0  . Years of Education: N/A   Occupational History  . Not on file.   Social History Main Topics  . Smoking status: Never Smoker   . Smokeless tobacco: Never Used  . Alcohol Use: No  . Drug Use: No  . Sexual Activity: No   Other Topics Concern  . Not on file   Social History Narrative   Lives with sister, no pets   Occupation: disabled, for bipolar and arthritis   Edu: GED   Activity: take walks   Diet: good water, vegetables daily   Religion: [place], Dr. Jimmye Norman (ph [phone])    Family History  Problem Relation Age of  Onset  . Colon cancer Mother 81  . Stomach cancer Mother     dx in her 29s?  Marland Kitchen Colon cancer Sister 65    Maternal half sister  . CAD Father     MI  . Diabetes Other     aunts and uncles both sides  . Arthritis Other     strong fmhx  . Mental illness Sister     anxiety/depression  . Breast cancer Maternal Grandmother   . Esophageal cancer Neg Hx   . Rectal cancer Neg Hx     Allergies  Allergen Reactions  . Risperidone And Related Other (See Comments)    AMS  and Memory Loss  . Penicillins Other (See Comments)    Pt states that it knocks her out for awhile.    Marland Kitchen Risperdal [Risperidone] Other (See Comments)    Reaction:  Memory loss/AMS evaluated by Dr Melrose Nakay[facility] at [facility]  . Ivp Dye [Iodinated Diagnostic Agents] Rash  . Tetanus Toxoids Rash       . Zetia [Ezetimibe] Rash    Medication list reviewed and updated in full in [place].  GEN: No fevers, chills. Nontoxic. Primarily MSK c/o today. MSK: Detailed in the HPI GI: tolerating PO intake without difficulty Neuro: No numbness, parasthesias, or tingling associated. Otherwise the pertinent positives of the ROS are noted above.   Objective:   BP 112/70 mmHg  Pulse 68  Temp(Src) 98.5 F (36.9 C) (Oral)  Ht 5\' 1"  (1.549 m)  Wt 204 lb 8 oz (92.761 kg)  BMI 38.66 kg/m2   GEN: WDWN, NAD, Non-toxic, Alert & Oriented x 3 HEENT: Atraumatic, Normocephalic.  Ears and Nose: No external deformity. EXTR: No clubbing/cyanosis/edema NEURO: Normal gait. antalgia PSYCH: Normally interactive. Conversant. Not depressed or anxious appearing.  Calm demeanor.   Knee:  L ROM: loss of 5 to 95 deg Effusion: neg Echymosis or edema: none Patellar tendon NT Painful PLICA: neg Patellar grind: negative Medial and lateral patellar facet loading: negative medial and lateral joint lines: medial Mcmurray's neg Flexion-pinch neg Varus and valgus stress: stable Lachman: neg Ant and Post drawer: neg Hip abduction, IR, ER:  WNL Hip flexion str: 5/5 Hip abd: 5/5 Quad: 5/5 VMO atrophy:No Hamstring concentric and eccentric: 5/5   Radiology: CLINICAL DATA: Left knee pain after fall  EXAM: LEFT KNEE - COMPLETE 4+ VIEW  COMPARISON: 04/08/2013  FINDINGS: Five views of left knee submitted. Again noted extensive tricompartment degenerative changes. Again noted spurring of femoral condyles and tibial plateau. Significant spurring of patella. Significant narrowing of patellofemoral joint space. Small joint effusion. No acute fracture or subluxation. Diffuse narrowing of joint space most significant medial compartment.  IMPRESSION: No acute fracture or subluxation. Extensive osteoarthritic changes as described above. Small joint effusion.   Electronically Signed  By: Lahoma Crocker M.D.  On: 06/12/2014 10:57  Assessment and Plan:   Primary osteoarthritis of left knee  Left knee pain - Plan: methylPREDNISolone acetate (DEPO-MEDROL) injection 80 mg  CKD 3, Bipolar 1 - cont voltaren gel  Inject 1 joint only  Knee Injection, L Patient verbally consented to procedure. Risks (including potential rare risk of infection), benefits, and alternatives explained. Sterilely prepped with Chloraprep. Ethyl cholride used for anesthesia. 8 cc Lidocaine 1% mixed with 2 mL Depo-Medrol 40 mg injected using the anteromedial approach without difficulty. No complications with procedure and tolerated well. Patient had decreased pain post-injection.   Follow-up: Return in about 3 weeks (around 09/12/2015).  Signed,  Maud Deed. Yoltzin Ransom, MD   Patient's Medications  New Prescriptions   No medications on file  Previous Medications   AZELASTINE (ASTELIN) 0.1 % NASAL SPRAY    Place 1 spray into both nostrils 2 (two) times daily. Use in each nostril as directed   CHOLECALCIFEROL (VITAMIN D3) 400 UNITS CAPS    Take 400 Units by mouth daily.   CLONAZEPAM (KLONOPIN) 0.5 MG TABLET    Take 1 tablet (0.5 mg total) by mouth  2 (two) times daily.   DICLOFENAC SODIUM (VOLTAREN) 1 % GEL    APPLY TO AFFECTED AREA THREE TIMES DAILY   LOVASTATIN (MEVACOR) 40 MG TABLET    Take 1 tablet (  40 mg total) by mouth at bedtime.   METOPROLOL TARTRATE (LOPRESSOR) 25 MG TABLET    Take 1.5 tablets (37.5 mg total) by mouth 2 (two) times daily.   OLANZAPINE (ZYPREXA) 20 MG TABLET    Take 1 tablet (20 mg total) by mouth at bedtime.   POLYETHYLENE GLYCOL POWDER (GLYCOLAX/MIRALAX) POWDER    DISSOLVE ONE CAPFUL (17 GRAMS) IN 4 TO 8 OZ OF FLUID AND TAKE BY MOUTH ONCE DAILY  Modified Medications   No medications on file  Discontinued Medications   ALLOPURINOL (ZYLOPRIM) 100 MG TABLET    Take 1 tablet (100 mg total) by mouth daily.   CLONAZEPAM (KLONOPIN) 0.5 MG TABLET       COLCHICINE 0.6 MG TABLET    Take 1 tablet (0.6 mg total) by mouth daily as needed.   DICYCLOMINE (BENTYL) 20 MG TABLET    Take 20 mg by mouth every 6 (six) hours as needed for spasms.   FIBER PO    Take 2 capsules by mouth daily.   FLUTICASONE (FLONASE) 50 MCG/ACT NASAL SPRAY    Place 2 sprays into both nostrils daily.   HYDROXYZINE (ATARAX/VISTARIL) 50 MG TABLET    Take 1 tablet by mouth daily.   INDOMETHACIN (INDOCIN) 50 MG CAPSULE    Take 50 mg by mouth 2 (two) times daily as needed (for gout flare).    NYSTATIN OINTMENT (MYCOSTATIN)    Apply 1 application topically 2 (two) times daily.   ONDANSETRON (ZOFRAN) 4 MG TABLET    Take 4 mg by mouth every 8 (eight) hours as needed for nausea or vomiting.   PROCTOZONE-HC 2.5 % RECTAL CREAM       SENNOSIDES-DOCUSATE SODIUM (SENOKOT-S) 8.6-50 MG TABLET    Take 1 tablet by mouth daily as needed for constipation.   SIMETHICONE (GAS-X) 80 MG CHEWABLE TABLET    Chew 1 tablet (80 mg total) by mouth 4 (four) times daily as needed for flatulence.

## 2015-08-22 NOTE — Progress Notes (Signed)
Pre visit review using our clinic review tool, if applicable. No additional management support is needed unless otherwise documented below in the visit note. 

## 2015-08-23 ENCOUNTER — Ambulatory Visit (INDEPENDENT_AMBULATORY_CARE_PROVIDER_SITE_OTHER): Payer: Medicare Other | Admitting: Psychiatry

## 2015-08-23 ENCOUNTER — Encounter: Payer: Self-pay | Admitting: Psychiatry

## 2015-08-23 VITALS — BP 124/68 | HR 72 | Temp 97.6°F | Ht 61.0 in | Wt 205.4 lb

## 2015-08-23 DIAGNOSIS — F3110 Bipolar disorder, current episode manic without psychotic features, unspecified: Secondary | ICD-10-CM | POA: Diagnosis not present

## 2015-08-23 NOTE — Patient Instructions (Signed)
Have labs drawn within the next two weeks. Fasting, nothing after midnight the night before labs except for water and medications.

## 2015-08-23 NOTE — Progress Notes (Signed)
BH MD/PA/NP OP Progress Note  08/23/2015 1:34 PM Gina [name]  MRN:  [id number]  Subjective:  Patient returns for follow-up for bipolar disorder, most recent episode manic. Patient states that overall she is doing well. She did have a there are injection in one of her knees yesterday and she states she's going back in 3 weeks to have an injection and the other knee. She states that she is discussed with that Dr. perhaps having a knee replacement.  He continues to take her Zyprexa. She states that her Klonopin she is largely only using at bedtime posted twice a day. She is remaining stable on this medication regimen after having some manic issues this past summer. She did stabilize largely with the existence of some home health nursing. Patient states she is doing well enough now that has been discontinued. However we will have to be alert to her decompensating again as her social support, her sister is now having medical problems.   Chief Complaint: Okay Chief Complaint    Follow-up; Medication Refill     Visit Diagnosis:     ICD-9-CM ICD-10-CM   1. Bipolar I disorder, most recent episode (or current) manic ([place]) 296.40 F31.10     Past Medical History:  Past Medical History  Diagnosis Date  . Bipolar depression [facility])     sees psychiatrist - psych admission 03/2015  . DJD (degenerative joint disease), lumbar     chronic lower back pain  . Obesity (BMI 30-39.9)   . History of colon cancer     s/p surgery  . 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 OSA on CPAP     6cm H2O  . RBBB   . Anemia in chronic kidney disease   . Blood transfusion without reported diagnosis 2009  . Cataract     left  . GERD (gastroesophageal reflux disease)   . Hyperlipidemia   . Hypertension   . History of bladder cancer 1997  . History of uterine cancer     s/p hysterectomy  . CKD (chronic kidney disease) stage 3, GFR 30-59 ml/min   . Personal history of colonic  adenomas and colon cancer 11/11/2012  . IDA (iron deficiency anemia) 01/2013    thought due to h/o polyps  . Positive hepatitis C antibody test 09/2014    but negative confirmatory testing  . Colon cancer ([place]) 1990's  . Family history of breast cancer   . Family history of colon cancer   . Family history of stomach cancer     Past Surgical History  Procedure Laterality Date  . Hernia repair  02/04/12  . Partial colectomy  about 2008    for colon cancer  . I & d abdominal wound  02/19/12  . Removal of infected mesh and abdominal wound vac placement  02/24/12  . Reexploration of abdominal wound and allograft placemet  02/26/12  . Partial hysterectomy  1981    uterine cancer, R ovary remains  . Left oophorectomy  2005  . Colonoscopy  07/2009  . Dexa  12/2009    WNL  . Dobutamine stress echo  12/2009    no evidence of ischemia  . Colonoscopy  11/2012    2 tubular adenomas, mild diverticulosis, pending genetic testing for Lynch syndrome [name]) rpt 2 yrs  . Sleep study  02/2014    OSA - AHI 55, nadir 81% [name])  . Breast biopsy Right 01/2014    fibroadenoma  . Colonoscopy  02/2015    TA, diverticulosis, rpt 2 yrs [name])   Family History:  Family History  Problem Relation Age of Onset  . Colon cancer Mother 83  . Stomach cancer Mother     dx in her 64s?  Marland Kitchen Colon cancer Sister 30    Maternal half sister  . CAD Father     MI  . Diabetes Other     aunts and uncles both sides  . Arthritis Other     strong fmhx  . Mental illness Sister     anxiety/depression  . Breast cancer Maternal Grandmother   . Esophageal cancer Neg Hx   . Rectal cancer Neg Hx    Social History:  Social History   Social History  . Marital Status: Single    Spouse Name: N/A  . Number of Children: 0  . Years of Education: N/A   Social History Main Topics  . Smoking status: Never Smoker   . Smokeless tobacco: Never Used  . Alcohol Use: No  . Drug Use: No  . Sexual Activity: No   Other Topics  Concern  . None   Social History Narrative   Lives with sister, no pets   Occupation: disabled, for bipolar and arthritis   Edu: GED   Activity: take walks   Diet: good water, vegetables daily   Religion: [place], Dr. Jimmye Norman (ph [phone])   Additional History:   Assessment:   Musculoskeletal: Strength & Muscle Tone: within normal limits Gait & Station: Patient has normal gait but ambulates slowly Patient leans: N/A  Psychiatric Specialty Exam: HPI  Review of Systems  Psychiatric/Behavioral: Negative for depression, suicidal ideas, hallucinations, memory loss and substance abuse. The patient is not nervous/anxious and does not have insomnia.   All other systems reviewed and are negative.   Blood pressure 124/68, pulse 72, temperature 97.6 F (36.4 C), temperature source Tympanic, height 5\' 1"  (1.549 m), weight 205 lb 6.4 oz (93.169 kg), SpO2 90 %.Body mass index is 38.83 kg/(m^2).  General Appearance: Well Groomed  Eye Contact:  Good  Speech:  Normal Rate  Volume:  Normal  Mood:  Good  Affect:  Congruent  Thought Process:  Linear and Logical  Orientation:  Full (Time, Place, and Person)  Thought Content:  Negative  Suicidal Thoughts:  No  Homicidal Thoughts:  No  Memory:  Immediate;   Good Recent;   Good Remote;   Fair  Judgement:  Good  Insight:  Good  Psychomotor Activity:  Negative  Concentration:  Good  Recall:  [place] of Knowledge: Fair  Language: Good  Akathisia:  Negative  Handed:  Right unknown  AIMS (if indicated):  Normal- Done on 06/22/15 normal   Assets:  Desire for Improvement  ADL's:  Intact  Cognition: WNL  Sleep:  Good   Is the patient at risk to self?  No. Has the patient been a risk to self in the past 6 months?  No. Has the patient been a risk to self within the distant past?  No. Is the patient a risk to others?  No. Has the patient been a risk to others in the past 6 months?  No. Has the  patient been a risk to others within the distant past?  No.  Current Medications: Current Outpatient Prescriptions  Medication Sig Dispense Refill  . azelastine (ASTELIN) 0.1 % nasal spray Place 1 spray into both nostrils 2 (two)  times daily. Use in each nostril as directed 30 mL 11  . Cholecalciferol (VITAMIN D3) 400 UNITS CAPS Take 400 Units by mouth daily.    . clonazePAM (KLONOPIN) 0.5 MG tablet Take 1 tablet (0.5 mg total) by mouth 2 (two) times daily. 60 tablet 4  . diclofenac sodium (VOLTAREN) 1 % GEL APPLY TO AFFECTED AREA THREE TIMES DAILY 100 g 1  . lovastatin (MEVACOR) 40 MG tablet Take 1 tablet (40 mg total) by mouth at bedtime. 90 tablet 0  . metoprolol tartrate (LOPRESSOR) 25 MG tablet Take 1.5 tablets (37.5 mg total) by mouth 2 (two) times daily. 90 tablet 11  . OLANZapine (ZYPREXA) 20 MG tablet Take 1 tablet (20 mg total) by mouth at bedtime. 30 tablet 4  . polyethylene glycol powder (GLYCOLAX/MIRALAX) powder DISSOLVE ONE CAPFUL (17 GRAMS) IN 4 TO 8 OZ OF FLUID AND TAKE BY MOUTH ONCE DAILY 255 g 0   No current facility-administered medications for this visit.    Medical Decision Making:  Established Problem, Stable/Improving (1)  Treatment Plan Summary:Medication management Patient's mood is stable she is not displaying any signs of hypomania, mania or depression. She is doing good and this is likely due to improve supports in the home. However review of the chart indicate that some home nursing service may have been discontinued and thus would have to observe for any change in her clinical status which at this time is good.  Bipolar disorder, most recent episode manic Continue Zyprexa to 20  mg daily and Klonopin 0.5 mg twice daily. Continue to monitor her weight on Zyprexa. We'll continue to monitor for any weight gain, however her weight has remained relatively stable. Her weight at her last visit 1 month ago was 204 and her weight today is 205.   Will have patient follow  up in 3 months. He is already aware of my departure from the practice and that another doctor within [facility] will be able to assume her care.  Faith Rogue 08/23/2015, 1:34 PM

## 2015-08-28 DIAGNOSIS — Z79899 Other long term (current) drug therapy: Secondary | ICD-10-CM | POA: Diagnosis not present

## 2015-08-28 LAB — LIPID PANEL
CHOLESTEROL: 171
HDL: 41
LDL (calc): 92
Triglycerides: 189

## 2015-08-29 NOTE — Progress Notes (Signed)
   THERAPIST PROGRESS NOTE  Session Time: 2mn  Participation Level: Active  Behavioral Response: CasualAlertAnxious  Type of Therapy: Individual Therapy  Treatment Goals addressed: Coping  Interventions: CBT, Motivational Interviewing, Solution Focused, Supportive and Reframing  Summary: Gina HOLLOWAYis a 65y.o. female who presents with continued symptoms of her diagnosis.  Patient states that she is taking medications as prescribed.  Patient denies any current stressors.  Patient denies having DSS or home health in the home.  She states that she has met her goals with DSS & home health.  Explored coping skills and monitored as she demonstrated how to use each.   Suicidal/Homicidal: Nowithout intent/plan  Therapist Response: LCSW provided Patient with ongoing emotional support and encouragement.  Normalized her feelings.  Commended Patient on her progress and reinforced the importance of client staying focused on her own strengths and resources and resiliency. Processed various strategies for dealing with stressors.    Plan: Return again in 4 weeks.  Diagnosis: Axis I: Bipolar, Manic    Axis II: No diagnosis    NLubertha South01/19/2017

## 2015-08-30 ENCOUNTER — Telehealth: Payer: Self-pay

## 2015-08-30 ENCOUNTER — Other Ambulatory Visit: Payer: Self-pay | Admitting: *Deleted

## 2015-08-30 NOTE — Telephone Encounter (Signed)
Pt received letter from ins co that diclofenac gel is no longer covered; pt called ins co and told pt could not advise of substitute, pt would need to contact PCP. Pt request affordable substitute to [facility]. Pt request cb when completed.

## 2015-08-30 NOTE — Patient Outreach (Signed)
[facility]) Care Management  08/30/2015  MARICA TRENTHAM 07-Dec-1950 [id number]   Phone call to patient to schedule home visit to follow up on literacy program and to assess for continued social needs.   Home visit scheduled for 09/04/15 at 10:00am   [place], [place] Management [phone]

## 2015-09-03 NOTE — Telephone Encounter (Signed)
plz complete PA for diclofenac.

## 2015-09-04 ENCOUNTER — Telehealth: Payer: Self-pay | Admitting: Psychiatry

## 2015-09-04 ENCOUNTER — Ambulatory Visit: Payer: Self-pay | Admitting: *Deleted

## 2015-09-04 NOTE — Telephone Encounter (Signed)
I spoke patient today and informed about her lab results. Overall her lipid panel was within normal limits with the exception of elevated triglycerides at 189. Prolactin was normal at 20.5 and her glucose was elevated to 68. I did send her primary care physician Dr. Danise Mina a note in regards to her elevated glucose. AW

## 2015-09-06 ENCOUNTER — Other Ambulatory Visit: Payer: Self-pay | Admitting: Family Medicine

## 2015-09-12 ENCOUNTER — Encounter: Payer: Self-pay | Admitting: *Deleted

## 2015-09-12 ENCOUNTER — Ambulatory Visit: Payer: Medicare Other | Admitting: Family Medicine

## 2015-09-12 ENCOUNTER — Telehealth: Payer: Self-pay | Admitting: Family Medicine

## 2015-09-12 ENCOUNTER — Other Ambulatory Visit: Payer: Self-pay | Admitting: *Deleted

## 2015-09-12 ENCOUNTER — Encounter: Payer: Self-pay | Admitting: Psychiatry

## 2015-09-12 DIAGNOSIS — R739 Hyperglycemia, unspecified: Secondary | ICD-10-CM

## 2015-09-12 DIAGNOSIS — K7581 Nonalcoholic steatohepatitis (NASH): Secondary | ICD-10-CM

## 2015-09-12 DIAGNOSIS — R7303 Prediabetes: Secondary | ICD-10-CM

## 2015-09-12 NOTE — Patient Outreach (Addendum)
  [name]) Care Management  [facility] Social Work  09/12/2015  Gina [name] 03-11-1951 [id number]  Subjective:  Patient is a 65 year old female.  Objective:   Current Medications:  Current Outpatient Prescriptions  Medication Sig Dispense Refill  . azelastine (ASTELIN) 0.1 % nasal spray Place 1 spray into both nostrils 2 (two) times daily. Use in each nostril as directed 30 mL 11  . Cholecalciferol (VITAMIN D3) 400 UNITS CAPS Take 400 Units by mouth daily.    . clonazePAM (KLONOPIN) 0.5 MG tablet Take 1 tablet (0.5 mg total) by mouth 2 (two) times daily. 60 tablet 4  . diclofenac sodium (VOLTAREN) 1 % GEL APPLY TO AFFECTED AREA THREE TIMES DAILY 100 g 1  . lovastatin (MEVACOR) 40 MG tablet Take 1 tablet (40 mg total) by mouth at bedtime. 90 tablet 0  . metoprolol tartrate (LOPRESSOR) 25 MG tablet Take 1.5 tablets (37.5 mg total) by mouth 2 (two) times daily. 90 tablet 11  . OLANZapine (ZYPREXA) 20 MG tablet Take 1 tablet (20 mg total) by mouth at bedtime. 30 tablet 4  . polyethylene glycol powder (GLYCOLAX/MIRALAX) powder DISSOLVE ONE CAPFUL (17 GRAMS) IN 4 TO 8 OZ OF FLUID AND TAKE BY MOUTH ONCE DAILY 255 g 6   No current facility-administered medications for this visit.    Functional Status:  In your present state of health, do you have any difficulty performing the following activities: 06/12/2015  Hearing? N  Vision? N  Difficulty concentrating or making decisions? N  Dressing or bathing? N  Doing errands, shopping? N  Preparing Food and eating ? Y  Using the Toilet? N  In the past six months, have you accidently leaked urine? Y  Do you have problems with loss of bowel control? N  Managing your Medications? Y  Managing your Finances? N  Housekeeping or managing your Housekeeping? N    Fall/Depression Screening:  PHQ 2/9 Scores 09/12/2015 06/12/2015 10/11/2014 09/16/2013 08/27/2012  PHQ - 2 Score 0 1 0 0 1    Assessment:  Discharge visit to see  patient.  She continues to see her Psychiatrist, however will be changing to a new Psychiatrist starting in April.  Patient doing well in therapy as well.  Per patient her last session with the therapist will be on 09/19/15.  Patient reports having a hernia and her sugar is elevated. Patient has an appointment  with her doctor on 09/14/15.   Patient continues to be encouraged to follow up at the [facility], contact information given.  She was also given the contact information for the literacy classes held at [name] [phone]. Disclosure statement along with information to be mailed.  Patient states that she will follow up after her hernia is addressed.   Patient verbalized having no further social work needs at this time.  She reports being able to afford her medicines and other household needs.  Plan: Patient's case to be closed at this time to [facility] care management.     Sheralyn Boatman [facility] Care Management [phone]

## 2015-09-12 NOTE — Telephone Encounter (Signed)
Received message from psychiatry that sugars have been trending up on current psych regimen with latest fasting sugar 268. plz call and schedule lab visit this week then f/u next week with myself to review.  Labs ordered. Thanks.

## 2015-09-13 ENCOUNTER — Telehealth: Payer: Self-pay | Admitting: Psychiatry

## 2015-09-13 ENCOUNTER — Ambulatory Visit: Payer: Medicare Other | Admitting: Family Medicine

## 2015-09-13 NOTE — Telephone Encounter (Signed)
Received a message through Epic the patient's blood sugars have been trending upward. Primary care Dr. Danise Mina sent the message that a fasting blood sugar was in the 260s. He indicated he is going to follow-up with the patient. Patient is been very stable on the current regimen which includes olanzapine. However given issues with weight gain and blood sugar perhaps separation can be made to change to another agent, however occult disease with this is the patient is stable on this regimen. Patient also does require a simple regimen given that it helps her with adherence. AW

## 2015-09-14 NOTE — Telephone Encounter (Signed)
.  left message to have patient return my call.  

## 2015-09-17 ENCOUNTER — Ambulatory Visit (INDEPENDENT_AMBULATORY_CARE_PROVIDER_SITE_OTHER): Payer: Medicare Other | Admitting: Family Medicine

## 2015-09-17 ENCOUNTER — Encounter: Payer: Self-pay | Admitting: Family Medicine

## 2015-09-17 VITALS — BP 130/72 | HR 69 | Temp 98.6°F | Ht 61.0 in | Wt 206.5 lb

## 2015-09-17 DIAGNOSIS — M25561 Pain in right knee: Secondary | ICD-10-CM

## 2015-09-17 DIAGNOSIS — M1711 Unilateral primary osteoarthritis, right knee: Secondary | ICD-10-CM

## 2015-09-17 MED ORDER — METHYLPREDNISOLONE ACETATE 40 MG/ML IJ SUSP
80.0000 mg | Freq: Once | INTRAMUSCULAR | Status: AC
  Administered 2015-09-17: 80 mg via INTRA_ARTICULAR

## 2015-09-17 NOTE — Progress Notes (Signed)
Dr. Frederico Hamman T. Gabriana Wilmott, MD, [facility] [place], [address] Phone: [phone] Fax: [phone]  09/17/2015  Patient: Gina [name], MRN: [id number], DOB: Oct 19, 1950, 65 y.o.  Primary Physician:  Ria Bush, MD   Chief Complaint  Patient presents with  . Follow-up    Left Knee   Subjective:   Gina [name] is a 65 y.o. very pleasant female patient who presents with the following:   Very pleasant lady who history of bipolar type I illness and multiple other medical problems listed below who I saw several weeks ago for knee pain, and I did an intra-articular left knee injection.  The left knee is done very well and she is not really having any pain or problems with that at this point.  She wanted to follow-up and discuss her right knee.  Both of them are having some pain, though the right one is now the predominant issue.  She does have significant crepitus and some occasional mechanical clicking and popping.  Could not get voltaren gel  08/22/2015 Last OV with Owens Loffler, MD  R knee was popping last week and left knee is hurting more.   Overall, the patient has known osteoarthritis, worse on the left, in known severe degenerative joint disease on the left on prior films. She is having pain both in her left and her right, and she is having some instability on the right as well. She is having some limitation in motion and pain bilaterally  Past Medical History, Surgical History, Social History, Family History, Problem List, Medications, and Allergies have been reviewed and updated if relevant.  Patient Active Problem List   Diagnosis Date Noted  . Pedal edema 05/18/2015  . Poor historian 05/18/2015  . High risk social situation 05/10/2015  . Nasal laceration 04/20/2015  . Acute on chronic kidney failure ([place]) 03/30/2015  . Acute-on-chronic renal failure ([place]) 03/30/2015  . Calculus of  gallbladder 03/28/2015  . NASH (nonalcoholic steatohepatitis) 03/28/2015  . Gout 03/27/2015  . HCV antibody positive 03/27/2015  . History of neoplasm of bladder 03/27/2015  . History of prolonged Q-T interval on ECG 03/27/2015  . Elevated fasting blood sugar 03/27/2015  . Colonic constipation 03/27/2015  . Elevation of level of transaminase or lactic acid dehydrogenase (LDH) 03/27/2015  . Personal history of other diseases of the circulatory system 03/27/2015  . Genetic testing 03/21/2015  . Family history of breast cancer   . Family history of colon cancer   . Family history of stomach cancer   . History of anemia 11/27/2014  . Bladder cancer ([place]) 11/27/2014  . History of urinary anomaly 11/27/2014  . Uterine cancer ([place]) 11/27/2014  . Allergic rhinitis 11/03/2014  . Contact dermatitis 11/03/2014  . Advanced care planning/counseling discussion 10/11/2014  . Memory deficit 09/13/2014  . Positive hepatitis C antibody test 09/04/2014  . Abnormal mental state 09/04/2014  . Amnesia 09/04/2014  . Thrombocytopenia ([place]) 08/12/2014  . Prediabetes 06/07/2014  . HTN (hypertension) 06/07/2014  . Trouble in sleeping 05/10/2014  . Right knee pain 03/16/2014  . Sleep apnea 02/13/2014  . Hyperlipidemia   . IDA (iron deficiency anemia) 01/26/2013  . History of colonic polyps 11/11/2012  . Recurrent falls 10/02/2012  . Nasal congestion 10/02/2012  . Medicare annual wellness visit, subsequent 08/27/2012  . Urine incontinence 08/27/2012  . Therapeutic drug monitoring 08/16/2012  . Recurrent ventral hernia 08/11/2012  . OSA on CPAP   . Intertrigo  05/27/2012  . CKD (chronic kidney disease) stage 3, GFR 30-59 ml/min   . Obesity (BMI 30-39.9)   . Colon cancer ([place]) 04/07/2012  . 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 Bipolar 1 disorder ([place]) 04/07/2012  . H/O malignant neoplasm of rectum, rectosigmoid junction, and anus 08/05/2011    Past Medical History  Diagnosis Date  . Bipolar  depression [facility])     sees psychiatrist - psych admission 03/2015  . DJD (degenerative joint disease), lumbar     chronic lower back pain  . Obesity (BMI 30-39.9)   . History of colon cancer     s/p surgery  . 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 OSA on CPAP     6cm H2O  . RBBB   . Anemia in chronic kidney disease   . Blood transfusion without reported diagnosis 2009  . Cataract     left  . GERD (gastroesophageal reflux disease)   . Hyperlipidemia   . Hypertension   . History of bladder cancer 1997  . History of uterine cancer     s/p hysterectomy  . CKD (chronic kidney disease) stage 3, GFR 30-59 ml/min   . Personal history of colonic adenomas and colon cancer 11/11/2012  . IDA (iron deficiency anemia) 01/2013    thought due to h/o polyps  . Positive hepatitis C antibody test 09/2014    but negative confirmatory testing  . Colon cancer ([place]) 1990's  . Family history of breast cancer   . Family history of colon cancer   . Family history of stomach cancer     Past Surgical History  Procedure Laterality Date  . Hernia repair  02/04/12  . Partial colectomy  about 2008    for colon cancer  . I & d abdominal wound  02/19/12  . Removal of infected mesh and abdominal wound vac placement  02/24/12  . Reexploration of abdominal wound and allograft placemet  02/26/12  . Partial hysterectomy  1981    uterine cancer, R ovary remains  . Left oophorectomy  2005  . Colonoscopy  07/2009  . Dexa  12/2009    WNL  . Dobutamine stress echo  12/2009    no evidence of ischemia  . Colonoscopy  11/2012    2 tubular adenomas, mild diverticulosis, pending genetic testing for Lynch syndrome [name]) rpt 2 yrs  . Sleep study  02/2014    OSA - AHI 55, nadir 81% [name])  . Breast biopsy Right 01/2014    fibroadenoma  . Colonoscopy  02/2015    TA, diverticulosis, rpt 2 yrs [name])    Social History   Social History  . Marital Status: Single    Spouse Name: N/A    . Number of Children: 0  . Years of Education: N/A   Occupational History  . Not on file.   Social History Main Topics  . Smoking status: Never Smoker   . Smokeless tobacco: Never Used  . Alcohol Use: No  . Drug Use: No  . Sexual Activity: No   Other Topics Concern  . Not on file   Social History Narrative   Lives with sister, no pets   Occupation: disabled, for bipolar and arthritis   Edu: GED   Activity: take walks   Diet: good water, vegetables daily   Religion: [place], Dr. Jimmye Norman (ph [phone])    Family History  Problem Relation Age  of Onset  . Colon cancer Mother 43  . Stomach cancer Mother     dx in her 81s?  Marland Kitchen Colon cancer Sister 54    Maternal half sister  . CAD Father     MI  . Diabetes Other     aunts and uncles both sides  . Arthritis Other     strong fmhx  . Mental illness Sister     anxiety/depression  . Breast cancer Maternal Grandmother   . Esophageal cancer Neg Hx   . Rectal cancer Neg Hx     Allergies  Allergen Reactions  . Risperidone And Related Other (See Comments)    AMS and Memory Loss  . Penicillins Other (See Comments)    Pt states that it knocks her out for awhile.    Marland Kitchen Risperdal [Risperidone] Other (See Comments)    Reaction:  Memory loss/AMS evaluated by Dr Melrose Nakayama Neuro at [facility]  . Ivp Dye [Iodinated Diagnostic Agents] Rash  . Tetanus Toxoids Rash       . Zetia [Ezetimibe] Rash    Medication list reviewed and updated in full in [place].  GEN: No fevers, chills. Nontoxic. Primarily MSK c/o today. MSK: Detailed in the HPI GI: tolerating PO intake without difficulty Neuro: No numbness, parasthesias, or tingling associated. Otherwise the pertinent positives of the ROS are noted above.   Objective:   BP 130/72 mmHg  Pulse 69  Temp(Src) 98.6 F (37 C) (Oral)  Ht 5\' 1"  (1.549 m)  Wt 206 lb 8 oz (93.668 kg)  BMI 39.04 kg/m2   GEN: WDWN, NAD, Non-toxic, Alert &  Oriented x 3 HEENT: Atraumatic, Normocephalic.  Ears and Nose: No external deformity. EXTR: No clubbing/cyanosis/edema NEURO: Normal gait. antalgia PSYCH: Normally interactive. Conversant. Not depressed or anxious appearing.  Calm demeanor.   Knee:  R ROM: loss of 3 to 95 deg Effusion: neg Echymosis or edema: none Patellar tendon NT Painful PLICA: neg Patellar grind: negative Medial and lateral patellar facet loading: negative medial and lateral joint lines: medial Mcmurray's neg Flexion-pinch neg Varus and valgus stress: stable Lachman: neg Ant and Post drawer: neg Hip abduction, IR, ER: WNL Hip flexion str: 5/5 Hip abd: 5/5 Quad: 5/5 VMO atrophy:No Hamstring concentric and eccentric: 5/5   Radiology: CLINICAL DATA: Left knee pain after fall  EXAM: LEFT KNEE - COMPLETE 4+ VIEW  COMPARISON: 04/08/2013  FINDINGS: Five views of left knee submitted. Again noted extensive tricompartment degenerative changes. Again noted spurring of femoral condyles and tibial plateau. Significant spurring of patella. Significant narrowing of patellofemoral joint space. Small joint effusion. No acute fracture or subluxation. Diffuse narrowing of joint space most significant medial compartment.  IMPRESSION: No acute fracture or subluxation. Extensive osteoarthritic changes as described above. Small joint effusion.   Electronically Signed  By: Lahoma Crocker M.D.  On: 06/12/2014 10:57  Assessment and Plan:   Primary osteoarthritis of right knee  Right knee pain - Plan: methylPREDNISolone acetate (DEPO-MEDROL) injection 80 mg  L knee improved.   Lidocaine 4% cream prn any joint  Knee Injection, R Patient verbally consented to procedure. Risks (including potential rare risk of infection), benefits, and alternatives explained. Sterilely prepped with Chloraprep. Ethyl cholride used for anesthesia. 8 cc Lidocaine 1% mixed with 2 mL Depo-Medrol 40 mg injected using the  anteromedial approach without difficulty. No complications with procedure and tolerated well. Patient had decreased pain post-injection.   Follow-up: prn  Patient Instructions  Aspercreme with 4% Lidocaine Creme    Signed,  Greogry Goodwyn T. Zoiee Wimmer, MD   Patient's Medications  New Prescriptions   No medications on file  Previous Medications   AZELASTINE (ASTELIN) 0.1 % NASAL SPRAY    Place 1 spray into both nostrils 2 (two) times daily. Use in each nostril as directed   CHOLECALCIFEROL (VITAMIN D3) 400 UNITS CAPS    Take 400 Units by mouth daily.   CLONAZEPAM (KLONOPIN) 0.5 MG TABLET    Take 1 tablet (0.5 mg total) by mouth 2 (two) times daily.   LOVASTATIN (MEVACOR) 40 MG TABLET    Take 1 tablet (40 mg total) by mouth at bedtime.   METOPROLOL TARTRATE (LOPRESSOR) 25 MG TABLET    Take 1.5 tablets (37.5 mg total) by mouth 2 (two) times daily.   OLANZAPINE (ZYPREXA) 20 MG TABLET    Take 1 tablet (20 mg total) by mouth at bedtime.   POLYETHYLENE GLYCOL POWDER (GLYCOLAX/MIRALAX) POWDER    DISSOLVE ONE CAPFUL (17 GRAMS) IN 4 TO 8 OZ OF FLUID AND TAKE BY MOUTH ONCE DAILY  Modified Medications   No medications on file  Discontinued Medications   DICLOFENAC SODIUM (VOLTAREN) 1 % GEL    APPLY TO AFFECTED AREA THREE TIMES DAILY

## 2015-09-17 NOTE — Patient Instructions (Signed)
Aspercreme with 4% Lidocaine Creme

## 2015-09-17 NOTE — Progress Notes (Signed)
Pre visit review using our clinic review tool, if applicable. No additional management support is needed unless otherwise documented below in the visit note. 

## 2015-09-19 ENCOUNTER — Ambulatory Visit (INDEPENDENT_AMBULATORY_CARE_PROVIDER_SITE_OTHER): Payer: Medicare Other | Admitting: Licensed Clinical Social Worker

## 2015-09-19 DIAGNOSIS — F3112 Bipolar disorder, current episode manic without psychotic features, moderate: Secondary | ICD-10-CM

## 2015-09-21 ENCOUNTER — Encounter: Payer: Self-pay | Admitting: Family Medicine

## 2015-09-21 ENCOUNTER — Ambulatory Visit (INDEPENDENT_AMBULATORY_CARE_PROVIDER_SITE_OTHER): Payer: Medicare Other | Admitting: Family Medicine

## 2015-09-21 VITALS — BP 132/78 | HR 56 | Temp 98.2°F | Wt 200.8 lb

## 2015-09-21 DIAGNOSIS — F319 Bipolar disorder, unspecified: Secondary | ICD-10-CM | POA: Diagnosis not present

## 2015-09-21 DIAGNOSIS — E785 Hyperlipidemia, unspecified: Secondary | ICD-10-CM | POA: Diagnosis not present

## 2015-09-21 DIAGNOSIS — I10 Essential (primary) hypertension: Secondary | ICD-10-CM | POA: Diagnosis not present

## 2015-09-21 DIAGNOSIS — IMO0001 Reserved for inherently not codable concepts without codable children: Secondary | ICD-10-CM

## 2015-09-21 DIAGNOSIS — R7301 Impaired fasting glucose: Secondary | ICD-10-CM

## 2015-09-21 DIAGNOSIS — R7303 Prediabetes: Secondary | ICD-10-CM

## 2015-09-21 DIAGNOSIS — R739 Hyperglycemia, unspecified: Secondary | ICD-10-CM

## 2015-09-21 DIAGNOSIS — K7581 Nonalcoholic steatohepatitis (NASH): Secondary | ICD-10-CM

## 2015-09-21 LAB — COMPREHENSIVE METABOLIC PANEL
ALBUMIN: 4.3 g/dL (ref 3.5–5.2)
ALT: 51 U/L — AB (ref 0–35)
AST: 48 U/L — AB (ref 0–37)
Alkaline Phosphatase: 135 U/L — ABNORMAL HIGH (ref 39–117)
BILIRUBIN TOTAL: 0.5 mg/dL (ref 0.2–1.2)
BUN: 35 mg/dL — ABNORMAL HIGH (ref 6–23)
CALCIUM: 9.7 mg/dL (ref 8.4–10.5)
CO2: 23 mEq/L (ref 19–32)
CREATININE: 1.25 mg/dL — AB (ref 0.40–1.20)
Chloride: 103 mEq/L (ref 96–112)
GFR: 45.68 mL/min — ABNORMAL LOW (ref >60.00)
Glucose, Bld: 301 mg/dL — ABNORMAL HIGH (ref 70–99)
Potassium: 4.8 mEq/L (ref 3.5–5.1)
Sodium: 135 mEq/L (ref 135–145)
Total Protein: 7.3 g/dL (ref 6.0–8.3)

## 2015-09-21 LAB — HEMOGLOBIN A1C: HEMOGLOBIN A1C: 12 % — AB (ref 4.6–6.5)

## 2015-09-21 NOTE — Assessment & Plan Note (Addendum)
Check A1c today. Concern for developing diabetes on antipsychotic - discussed with patient. Will likely refer to nutritionist

## 2015-09-21 NOTE — Assessment & Plan Note (Signed)
Chronic, stable. Continue current regimen of metoprolol 37.5mg  bid.

## 2015-09-21 NOTE — Assessment & Plan Note (Signed)
Chronic, stable. Continue lovastatin.  

## 2015-09-21 NOTE — Progress Notes (Signed)
Pre visit review using our clinic review tool, if applicable. No additional management support is needed unless otherwise documented below in the visit note. 

## 2015-09-21 NOTE — Progress Notes (Signed)
BP 132/78 mmHg  Pulse 56  Temp(Src) 98.2 F (36.8 C) (Oral)  Wt 200 lb 12 oz (91.06 kg)  SpO2 97%   CC: discuss hernia  Subjective:    Patient ID: Gina [name], female    DOB: [birth year], 65 y.o.   MRN: [id number]  HPI: DONALDEEN HLINKA is a 65 y.o. female presenting on 09/21/2015 for Hernia   Recently seen Dr Lorelei Pont for bilateral knee osteoarthritis s/p steroid injections into both knees.   Worried about weight gain and enlarging ventral hernia. Denies fevers/chills, normal BM. miralax daily helps.   Bipolar - well managed on zyprexa 20mg  nightly and klonopin - decreasing to one pill nightly.  I did receive notice from Dr Jimmye Norman psychiatrist about sugars trending up on current psych regimen with latest fasting sugar 268. Pt nonfasting today. FLP stable - except trig 189.  Relevant past medical, surgical, family and social history reviewed and updated as indicated. Interim medical history since our last visit reviewed. Allergies and medications reviewed and updated. Current Outpatient Prescriptions on File Prior to Visit  Medication Sig  . azelastine (ASTELIN) 0.1 % nasal spray Place 1 spray into both nostrils 2 (two) times daily. Use in each nostril as directed  . Cholecalciferol (VITAMIN D3) 400 UNITS CAPS Take 400 Units by mouth daily.  Marland Kitchen lovastatin (MEVACOR) 40 MG tablet Take 1 tablet (40 mg total) by mouth at bedtime.  . metoprolol tartrate (LOPRESSOR) 25 MG tablet Take 1.5 tablets (37.5 mg total) by mouth 2 (two) times daily.  Marland Kitchen OLANZapine (ZYPREXA) 20 MG tablet Take 1 tablet (20 mg total) by mouth at bedtime.  . polyethylene glycol powder (GLYCOLAX/MIRALAX) powder DISSOLVE ONE CAPFUL (17 GRAMS) IN 4 TO 8 OZ OF FLUID AND TAKE BY MOUTH ONCE DAILY  . clonazePAM (KLONOPIN) 0.5 MG tablet Take 1 tablet (0.5 mg total) by mouth 2 (two) times daily.   No current facility-administered medications on file prior to visit.    Review of Systems Per HPI unless  specifically indicated in ROS section     Objective:    BP 132/78 mmHg  Pulse 56  Temp(Src) 98.2 F (36.8 C) (Oral)  Wt 200 lb 12 oz (91.06 kg)  SpO2 97%  Wt Readings from Last 3 Encounters:  09/21/15 200 lb 12 oz (91.06 kg)  09/17/15 206 lb 8 oz (93.668 kg)  08/23/15 205 lb 6.4 oz (93.169 kg)   Body mass index is 37.95 kg/(m^2).  Physical Exam  Constitutional: She appears well-developed and well-nourished. No distress.  HENT:  Mouth/Throat: Oropharynx is clear and moist. No oropharyngeal exudate.  Cardiovascular: Normal rate, regular rhythm, normal heart sounds and intact distal pulses.   No murmur heard. Pulmonary/Chest: Effort normal and breath sounds normal. No respiratory distress. She has no wheezes. She has no rales.  Abdominal: Soft. Bowel sounds are normal. She exhibits no distension and no mass. There is no hepatosplenomegaly. There is no tenderness. There is no rebound, no guarding and no CVA tenderness. A hernia is present. Hernia confirmed positive in the ventral area (large, reducible).  Musculoskeletal: She exhibits no edema.  Skin: Skin is warm and dry. No rash noted.  Psychiatric: She has a normal mood and affect.  Nursing note and vitals reviewed.  Results for orders placed or performed during the hospital encounter of 05/04/15  CBC  Result Value Ref Range   WBC 7.7 3.6 - 11.0 K/uL   RBC 3.89 3.80 - 5.20 MIL/uL   Hemoglobin 11.8 (L) 12.0 -  16.0 g/dL   HCT 35.2 35.0 - 47.0 %   MCV 90.5 80.0 - 100.0 fL   MCH 30.2 26.0 - 34.0 pg   MCHC 33.4 32.0 - 36.0 g/dL   RDW 15.8 (H) 11.5 - 14.5 %   Platelets 143 (L) 150 - 440 K/uL  Basic metabolic panel  Result Value Ref Range   Sodium 147 (H) 135 - 145 mmol/L   Potassium 4.5 3.5 - 5.1 mmol/L   Chloride 114 (H) 101 - 111 mmol/L   CO2 27 22 - 32 mmol/L   Glucose, Bld 83 65 - 99 mg/dL   BUN 27 (H) 6 - 20 mg/dL   Creatinine, Ser 1.14 (H) 0.44 - 1.00 mg/dL   Calcium 9.5 8.9 - 10.3 mg/dL   GFR calc non Af Amer 50 (L)  >60 mL/min   GFR calc Af Amer 58 (L) >60 mL/min   Anion gap 6 5 - 15  Hepatic function panel  Result Value Ref Range   Total Protein 7.4 6.5 - 8.1 g/dL   Albumin 4.1 3.5 - 5.0 g/dL   AST 52 (H) 15 - 41 U/L   ALT 49 14 - 54 U/L   Alkaline Phosphatase 138 (H) 38 - 126 U/L   Total Bilirubin 0.4 0.3 - 1.2 mg/dL   Bilirubin, Direct <0.1 (L) 0.1 - 0.5 mg/dL   Indirect Bilirubin NOT CALCULATED 0.3 - 0.9 mg/dL      Assessment & Plan:   Problem List Items Addressed This Visit    Prediabetes    Lab Results  Component Value Date   HGBA1C 6.1 05/27/2014         Obesity, Class II, BMI 35-39.9, with comorbidity ([place])    Likely will refer to nutritionist       NASH (nonalcoholic steatohepatitis)    Check LFTs today.      Hyperlipidemia    Chronic, stable. Continue lovastatin.      HTN (hypertension)    Chronic, stable. Continue current regimen of metoprolol 37.5mg  bid.      Elevated fasting blood sugar    Check A1c today. Concern for developing diabetes on antipsychotic - discussed with patient. Will likely refer to nutritionist      Bipolar 1 disorder ([place]) - Primary    Stable on simplified medical regimen of zyprexa 20mg  daily and klonopin 0.5mg  nightly.  Concern for developing diabetes - check A1c today. Has f/u with new psychiatrist in April.       Other Visit Diagnoses    Hyperglycemia            Follow up plan: No Follow-up on file.

## 2015-09-21 NOTE — Assessment & Plan Note (Signed)
Check LFTs today. 

## 2015-09-21 NOTE — Patient Instructions (Signed)
Check labs today.

## 2015-09-21 NOTE — Assessment & Plan Note (Signed)
Lab Results  Component Value Date   HGBA1C 6.1 05/27/2014

## 2015-09-21 NOTE — Assessment & Plan Note (Signed)
Likely will refer to nutritionist

## 2015-09-21 NOTE — Assessment & Plan Note (Addendum)
Stable on simplified medical regimen of zyprexa 20mg  daily and klonopin 0.5mg  nightly.  Concern for developing diabetes - check A1c today. Has f/u with new psychiatrist in April.

## 2015-09-22 NOTE — Telephone Encounter (Signed)
Seen yesterday

## 2015-09-26 ENCOUNTER — Other Ambulatory Visit: Payer: Self-pay | Admitting: Family Medicine

## 2015-09-26 ENCOUNTER — Encounter: Payer: Self-pay | Admitting: Family Medicine

## 2015-09-26 MED ORDER — METFORMIN HCL 500 MG PO TABS: 500.0000 mg | ORAL_TABLET | Freq: Two times a day (BID) | ORAL | Status: DC

## 2015-09-27 ENCOUNTER — Telehealth: Payer: Self-pay | Admitting: Family Medicine

## 2015-09-27 NOTE — Telephone Encounter (Signed)
Pt wold like lab results and explanation of when she is due to come back. Says that she never received a call  cb number is [phone] Thanks

## 2015-10-01 NOTE — Telephone Encounter (Signed)
Spoke with patient and notified of results.

## 2015-10-02 NOTE — Progress Notes (Signed)
   THERAPIST PROGRESS NOTE  Session Time: 68  Participation Level: Active  Behavioral Response: Fairly GroomedAlertAnxious  Type of Therapy: Individual Therapy  Treatment Goals addressed: Coping  Interventions: CBT, Motivational Interviewing, Solution Focused, Strength-based, Supportive, Family Systems and Reframing  Summary: Gina [name] is a 65 y.o. female who presents with symptoms of her diagnosis. Therapist assessed mood. Therapist discussed with client her progress. Therapist review each goal with client and asked Client to rate progress.  Therapist assisted client with processing her current plan about her medication regimen.  Therapist inquired about living conditions, financial assistance and medical concerns.  Therapist role played on how to search for a new pharmacy.  Therapist recited a few mock questions and encouraged Patient to get transferred to a pharmacy in her town.  Therapist assessed progress toward goal.  Discussion of Therapist going on [place] and provided patient with a list of therapist in the area.   Suicidal/Homicidal: Nowithout intent/plan  Therapist Response: LCSW provided Patient with ongoing emotional support and encouragement.  Normalized her feelings.  Commended Patient on her progress and reinforced the importance of client staying focused on her own strengths and resources and resiliency. Processed various strategies for dealing with stressors.    Plan:  Discussion of Therapist going on [place] and provided patient with a list of therapist in the area.   Diagnosis: Axis I: Bipolar    Axis II: No diagnosis    Lubertha South 09/19/2015

## 2015-10-04 ENCOUNTER — Encounter: Payer: Self-pay | Admitting: *Deleted

## 2015-10-05 NOTE — Telephone Encounter (Signed)
PA completed.

## 2015-10-16 ENCOUNTER — Ambulatory Visit: Payer: Medicare Other | Admitting: Psychiatry

## 2015-10-26 ENCOUNTER — Other Ambulatory Visit: Payer: Self-pay | Admitting: Family Medicine

## 2015-11-07 ENCOUNTER — Telehealth: Payer: Self-pay

## 2015-11-07 ENCOUNTER — Encounter: Payer: Self-pay | Admitting: Family Medicine

## 2015-11-07 ENCOUNTER — Ambulatory Visit (INDEPENDENT_AMBULATORY_CARE_PROVIDER_SITE_OTHER): Payer: Medicare Other | Admitting: Family Medicine

## 2015-11-07 VITALS — BP 134/78 | HR 66 | Temp 98.0°F | Wt 200.0 lb

## 2015-11-07 DIAGNOSIS — E1121 Type 2 diabetes mellitus with diabetic nephropathy: Secondary | ICD-10-CM | POA: Diagnosis not present

## 2015-11-07 DIAGNOSIS — F319 Bipolar disorder, unspecified: Secondary | ICD-10-CM | POA: Diagnosis not present

## 2015-11-07 DIAGNOSIS — E1165 Type 2 diabetes mellitus with hyperglycemia: Secondary | ICD-10-CM | POA: Diagnosis not present

## 2015-11-07 DIAGNOSIS — IMO0002 Reserved for concepts with insufficient information to code with codable children: Secondary | ICD-10-CM

## 2015-11-07 MED ORDER — ONETOUCH ULTRASOFT LANCETS MISC: Status: DC

## 2015-11-07 MED ORDER — GLUCOSE BLOOD VI STRP: ORAL_STRIP | Status: DC

## 2015-11-07 MED ORDER — ONETOUCH ULTRA SYSTEM W/DEVICE KIT: 1.0000 | PACK | Freq: Once | Status: DC

## 2015-11-07 NOTE — Assessment & Plan Note (Signed)
Has done well with simplified med regimen of klonopin and zyprexa. I recommended against any changes.

## 2015-11-07 NOTE — Patient Instructions (Addendum)
Continue metformin 500mg  twice daily, take 15 min after a meal.  We will send you to [facility] for diabetes education classes (take Lelan Pons if you can).  I will prescribe glucose meter for you with strips to check sugars - check once daily fasting in the morning before breakfast. Keep log of sugars (jot down on log provided today) and drop off if you can in 2-3 weeks for me to review. Call to schedule eye exam when you're next due Return in 6 weeks for diabetes follow up visit.  Diabetes Mellitus and Food It is important for you to manage your blood sugar (glucose) level. Your blood glucose level can be greatly affected by what you eat. Eating healthier foods in the appropriate amounts throughout the day at about the same time each day will help you control your blood glucose level. It can also help slow or prevent worsening of your diabetes mellitus. Healthy eating may even help you improve the level of your blood pressure and reach or maintain a healthy weight.  General recommendations for healthful eating and cooking habits include:  Eating meals and snacks regularly. Avoid going long periods of time without eating to lose weight.  Eating a diet that consists mainly of plant-based foods, such as fruits, vegetables, nuts, legumes, and whole grains.  Using low-heat cooking methods, such as baking, instead of high-heat cooking methods, such as deep frying. Work with your dietitian to make sure you understand how to use the Nutrition Facts information on food labels. HOW CAN FOOD AFFECT ME? Carbohydrates Carbohydrates affect your blood glucose level more than any other type of food. Your dietitian will help you determine how many carbohydrates to eat at each meal and teach you how to count carbohydrates. Counting carbohydrates is important to keep your blood glucose at a healthy level, especially if you are using insulin or taking certain medicines for diabetes mellitus. Alcohol Alcohol can  cause sudden decreases in blood glucose (hypoglycemia), especially if you use insulin or take certain medicines for diabetes mellitus. Hypoglycemia can be a life-threatening condition. Symptoms of hypoglycemia (sleepiness, dizziness, and disorientation) are similar to symptoms of having too much alcohol.  If your health care provider has given you approval to drink alcohol, do so in moderation and use the following guidelines:  Women should not have more than one drink per day, and men should not have more than two drinks per day. One drink is equal to:  12 oz of beer.  5 oz of wine.  1 oz of hard liquor.  Do not drink on an empty stomach.  Keep yourself hydrated. Have water, diet soda, or unsweetened iced tea.  Regular soda, juice, and other mixers might contain a lot of carbohydrates and should be counted. WHAT FOODS ARE NOT RECOMMENDED? As you make food choices, it is important to remember that all foods are not the same. Some foods have fewer nutrients per serving than other foods, even though they might have the same number of calories or carbohydrates. It is difficult to get your body what it needs when you eat foods with fewer nutrients. Examples of foods that you should avoid that are high in calories and carbohydrates but low in nutrients include:  Trans fats (most processed foods list trans fats on the Nutrition Facts label).  Regular soda.  Juice.  Candy.  Sweets, such as cake, pie, doughnuts, and cookies.  Fried foods. WHAT FOODS CAN I EAT? Eat nutrient-rich foods, which will nourish your body and  keep you healthy. The food you should eat also will depend on several factors, including:  The calories you need.  The medicines you take.  Your weight.  Your blood glucose level.  Your blood pressure level.  Your cholesterol level. You should eat a variety of foods, including:  Protein.  Lean cuts of meat.  Proteins low in saturated fats, such as fish, egg  whites, and beans. Avoid processed meats.  Fruits and vegetables.  Fruits and vegetables that may help control blood glucose levels, such as apples, mangoes, and yams.  Dairy products.  Choose fat-free or low-fat dairy products, such as milk, yogurt, and cheese.  Grains, bread, pasta, and rice.  Choose whole grain products, such as multigrain bread, whole oats, and brown rice. These foods may help control blood pressure.  Fats.  Foods containing healthful fats, such as nuts, avocado, olive oil, canola oil, and fish. DOES EVERYONE WITH DIABETES MELLITUS HAVE THE SAME MEAL PLAN? Because every person with diabetes mellitus is different, there is not one meal plan that works for everyone. It is very important that you meet with a dietitian who will help you create a meal plan that is just right for you.   This information is not intended to replace advice given to you by your health care provider. Make sure you discuss any questions you have with your health care provider.   Document Released: 04/17/2005 Document Revised: 08/11/2014 Document Reviewed: 06/17/2013 Elsevier Interactive Patient Education [employer].

## 2015-11-07 NOTE — Progress Notes (Signed)
BP 134/78 mmHg  Pulse 66  Temp(Src) 98 F (36.7 C) (Oral)  Wt 200 lb (90.719 kg)  SpO2 98%   CC: DM f/u visit  Subjective:    Patient ID: Gina [name], female    DOB: 1950/10/17, 65 y.o.   MRN: [id number]  HPI: Gina [name] is a 65 y.o. female presenting on 11/07/2015 for Diabetes   See prior note and labs for details. Briefly, A1c markedly elevated to 12% so we started metformin 500mg  bid. She finds that if she takes med 10-15 min after a meal tolerated better. Due for DSME referral, advised to take her sister with her. She does not check sugars. Has started taking walks at home. Sees eye doctor regularly.  Lab Results  Component Value Date   HGBA1C 12.0* 09/21/2015   Diabetic Foot Exam - Simple   Simple Foot Form  Diabetic Foot exam was performed with the following findings:  Yes 11/07/2015  9:45 AM  Visual Inspection  No deformities, no ulcerations, no other skin breakdown bilaterally:  Yes  Sensation Testing  Intact to touch and monofilament testing bilaterally:  Yes  Pulse Check  Posterior Tibialis and Dorsalis pulse intact bilaterally:  Yes  Comments       Seeing Dr Lorelei Pont for worsening L leg pain. Considering knee replacement.   Relevant past medical, surgical, family and social history reviewed and updated as indicated. Interim medical history since our last visit reviewed. Allergies and medications reviewed and updated. Current Outpatient Prescriptions on File Prior to Visit  Medication Sig  . azelastine (ASTELIN) 0.1 % nasal spray Place 1 spray into both nostrils 2 (two) times daily. Use in each nostril as directed  . Cholecalciferol (VITAMIN D3) 400 UNITS CAPS Take 400 Units by mouth daily.  Marland Kitchen lovastatin (MEVACOR) 40 MG tablet TAKE 1 TABLET (40 MG TOTAL) BY MOUTH AT BEDTIME.  . metFORMIN (GLUCOPHAGE) 500 MG tablet Take 1 tablet (500 mg total) by mouth 2 (two) times daily with a meal.  . metoprolol tartrate (LOPRESSOR) 25 MG tablet Take 1.5  tablets (37.5 mg total) by mouth 2 (two) times daily.  Marland Kitchen OLANZapine (ZYPREXA) 20 MG tablet Take 1 tablet (20 mg total) by mouth at bedtime.  . polyethylene glycol powder (GLYCOLAX/MIRALAX) powder DISSOLVE ONE CAPFUL (17 GRAMS) IN 4 TO 8 OZ OF FLUID AND TAKE BY MOUTH ONCE DAILY  . trolamine salicylate (ASPERCREME) 10 % cream Apply 1 application topically 2 (two) times daily.  . clonazePAM (KLONOPIN) 0.5 MG tablet Take 1 tablet (0.5 mg total) by mouth 2 (two) times daily. (Patient taking differently: Take 0.5 mg by mouth at bedtime. )   No current facility-administered medications on file prior to visit.    Review of Systems Per HPI unless specifically indicated in ROS section     Objective:    BP 134/78 mmHg  Pulse 66  Temp(Src) 98 F (36.7 C) (Oral)  Wt 200 lb (90.719 kg)  SpO2 98%  Wt Readings from Last 3 Encounters:  11/07/15 200 lb (90.719 kg)  09/21/15 200 lb 12 oz (91.06 kg)  09/17/15 206 lb 8 oz (93.668 kg)    Physical Exam  Constitutional: She appears well-developed and well-nourished. No distress.  HENT:  Head: Normocephalic and atraumatic.  Right Ear: External ear normal.  Left Ear: External ear normal.  Nose: Nose normal.  Mouth/Throat: Oropharynx is clear and moist. No oropharyngeal exudate.  Eyes: Conjunctivae and EOM are normal. Pupils are equal, round, and reactive to light. No  scleral icterus.  Neck: Normal range of motion. Neck supple.  Cardiovascular: Normal rate, regular rhythm, normal heart sounds and intact distal pulses.   No murmur heard. Pulmonary/Chest: Effort normal and breath sounds normal. No respiratory distress. She has no wheezes. She has no rales.  Musculoskeletal: She exhibits no edema.  See HPI for foot exam if done  Lymphadenopathy:    She has no cervical adenopathy.  Skin: Skin is warm and dry. No rash noted.  Psychiatric: She has a normal mood and affect.  Nursing note and vitals reviewed.  Results for orders placed or performed in  visit on 10/04/15  Lipid panel  Result Value Ref Range   Cholesterol 171    Triglycerides 189    HDL 41    LDL (calc) 92    Lab Results  Component Value Date   HGBA1C 12.0* 09/21/2015      Assessment & Plan:   Problem List Items Addressed This Visit    Bipolar 1 disorder ([place])    Has done well with simplified med regimen of klonopin and zyprexa. I recommended against any changes.      Uncontrolled type 2 diabetes mellitus with nephropathy ([place]) - Primary    Deteriorated control with A1c 12%, ?related to antipsychotic (which is controlling bipolar very well). 6 wks ago we started metformin 500mg  bid, pt endorses ongoing indigestion. Offered XL formulation but pt desires to continue IR form as cheaper and tolerable if taken 15 min after meal. Discussed diabetic diet, importance of tight sugar, bp, chol control, and need for yearly eye exam and regular foot exams at home.  Sent glucose meter with strips and lancets to pharmacy. Refer to DSME at [facility].      Relevant Orders   Ambulatory referral to diabetic education       Follow up plan: Return in about 6 weeks (around 12/19/2015), or as needed, for follow up visit.  Ria Bush, MD

## 2015-11-07 NOTE — Telephone Encounter (Signed)
Pt wants to know if test strips were sent with ICD code to pharmacy; advised pt yes and if pharmacy has questions ask pharmacy to contact [facility]. Pt voiced understanding.

## 2015-11-07 NOTE — Assessment & Plan Note (Signed)
Deteriorated control with A1c 12%, ?related to antipsychotic (which is controlling bipolar very well). 6 wks ago we started metformin 500mg  bid, pt endorses ongoing indigestion. Offered XL formulation but pt desires to continue IR form as cheaper and tolerable if taken 15 min after meal. Discussed diabetic diet, importance of tight sugar, bp, chol control, and need for yearly eye exam and regular foot exams at home.  Sent glucose meter with strips and lancets to pharmacy. Refer to DSME at [facility].

## 2015-11-07 NOTE — Progress Notes (Signed)
Pre visit review using our clinic review tool, if applicable. No additional management support is needed unless otherwise documented below in the visit note. 

## 2015-11-08 ENCOUNTER — Other Ambulatory Visit: Payer: Self-pay

## 2015-11-08 ENCOUNTER — Telehealth: Payer: Self-pay | Admitting: Family Medicine

## 2015-11-08 ENCOUNTER — Telehealth: Payer: Self-pay | Admitting: *Deleted

## 2015-11-08 MED ORDER — LANCETS 30G MISC: Status: DC

## 2015-11-08 MED ORDER — GLUCOSE BLOOD VI STRP: ORAL_STRIP | Status: DC

## 2015-11-08 MED ORDER — ACCU-CHEK AVIVA PLUS W/DEVICE KIT: PACK | Status: DC

## 2015-11-08 NOTE — Telephone Encounter (Signed)
No problem.

## 2015-11-08 NOTE — Telephone Encounter (Signed)
Gina [name] please call patient.  Patient needs help with transportation.  She lives in [place].

## 2015-11-08 NOTE — Telephone Encounter (Signed)
Pt said ins did not approve onetouch and pt needs accuchek aviva plus sent to [facility] [address]/ advised pt done.

## 2015-11-08 NOTE — Telephone Encounter (Signed)
I had to send in a glucometer, lancets and test strips in under your name because Dr. Darnell Level can't authorize it since he still doesn't have his medicare certification back. I'm sorry!

## 2015-11-13 ENCOUNTER — Ambulatory Visit: Payer: Medicare Other | Admitting: Psychiatry

## 2015-11-19 DIAGNOSIS — E119 Type 2 diabetes mellitus without complications: Secondary | ICD-10-CM | POA: Diagnosis not present

## 2015-11-19 DIAGNOSIS — H25813 Combined forms of age-related cataract, bilateral: Secondary | ICD-10-CM | POA: Diagnosis not present

## 2015-11-19 DIAGNOSIS — H524 Presbyopia: Secondary | ICD-10-CM | POA: Diagnosis not present

## 2015-11-19 LAB — HM DIABETES EYE EXAM

## 2015-11-20 ENCOUNTER — Emergency Department
Admission: EM | Admit: 2015-11-20 | Discharge: 2015-11-21 | Disposition: A | Discharge status: Home / Self Care | Payer: Medicare Other | Attending: Emergency Medicine | Admitting: Emergency Medicine

## 2015-11-20 ENCOUNTER — Telehealth: Payer: Self-pay | Admitting: Psychiatry

## 2015-11-20 DIAGNOSIS — Z8542 Personal history of malignant neoplasm of other parts of uterus: Secondary | ICD-10-CM | POA: Diagnosis not present

## 2015-11-20 DIAGNOSIS — Z79899 Other long term (current) drug therapy: Secondary | ICD-10-CM | POA: Insufficient documentation

## 2015-11-20 DIAGNOSIS — C189 Malignant neoplasm of colon, unspecified: Secondary | ICD-10-CM | POA: Insufficient documentation

## 2015-11-20 DIAGNOSIS — K219 Gastro-esophageal reflux disease without esophagitis: Secondary | ICD-10-CM | POA: Insufficient documentation

## 2015-11-20 DIAGNOSIS — F3130 Bipolar disorder, current episode depressed, mild or moderate severity, unspecified: Secondary | ICD-10-CM | POA: Insufficient documentation

## 2015-11-20 DIAGNOSIS — F3110 Bipolar disorder, current episode manic without psychotic features, unspecified: Secondary | ICD-10-CM | POA: Diagnosis not present

## 2015-11-20 DIAGNOSIS — G4730 Sleep apnea, unspecified: Secondary | ICD-10-CM | POA: Diagnosis not present

## 2015-11-20 DIAGNOSIS — E669 Obesity, unspecified: Secondary | ICD-10-CM | POA: Insufficient documentation

## 2015-11-20 DIAGNOSIS — G4700 Insomnia, unspecified: Secondary | ICD-10-CM | POA: Insufficient documentation

## 2015-11-20 DIAGNOSIS — Z8551 Personal history of malignant neoplasm of bladder: Secondary | ICD-10-CM | POA: Insufficient documentation

## 2015-11-20 DIAGNOSIS — E1165 Type 2 diabetes mellitus with hyperglycemia: Secondary | ICD-10-CM | POA: Insufficient documentation

## 2015-11-20 DIAGNOSIS — Z86010 Personal history of colonic polyps: Secondary | ICD-10-CM | POA: Diagnosis not present

## 2015-11-20 DIAGNOSIS — Z85038 Personal history of other malignant neoplasm of large intestine: Secondary | ICD-10-CM | POA: Insufficient documentation

## 2015-11-20 DIAGNOSIS — E785 Hyperlipidemia, unspecified: Secondary | ICD-10-CM | POA: Diagnosis not present

## 2015-11-20 DIAGNOSIS — E1121 Type 2 diabetes mellitus with diabetic nephropathy: Secondary | ICD-10-CM | POA: Diagnosis not present

## 2015-11-20 DIAGNOSIS — I129 Hypertensive chronic kidney disease with stage 1 through stage 4 chronic kidney disease, or unspecified chronic kidney disease: Secondary | ICD-10-CM | POA: Insufficient documentation

## 2015-11-20 DIAGNOSIS — N183 Chronic kidney disease, stage 3 (moderate): Secondary | ICD-10-CM | POA: Insufficient documentation

## 2015-11-20 LAB — CBC
HEMATOCRIT: 38.4 % (ref 35.0–47.0)
HEMOGLOBIN: 13.3 g/dL (ref 12.0–16.0)
MCH: 31 pg (ref 26.0–34.0)
MCHC: 34.5 g/dL (ref 32.0–36.0)
MCV: 89.9 fL (ref 80.0–100.0)
Platelets: 161 10*3/uL (ref 150–440)
RBC: 4.28 MIL/uL (ref 3.80–5.20)
RDW: 13.8 % (ref 11.5–14.5)
WBC: 8.4 10*3/uL (ref 3.6–11.0)

## 2015-11-20 LAB — URINALYSIS COMPLETE WITH MICROSCOPIC (ARMC ONLY)
BACTERIA UA: NONE SEEN
BILIRUBIN URINE: NEGATIVE
Glucose, UA: NEGATIVE mg/dL
Ketones, ur: NEGATIVE mg/dL
LEUKOCYTES UA: NEGATIVE
NITRITE: NEGATIVE
Protein, ur: NEGATIVE mg/dL
SPECIFIC GRAVITY, URINE: 1.012 (ref 1.005–1.030)
pH: 5 (ref 5.0–8.0)

## 2015-11-20 LAB — COMPREHENSIVE METABOLIC PANEL
ALT: 53 U/L (ref 14–54)
AST: 54 U/L — AB (ref 15–41)
Albumin: 4.6 g/dL (ref 3.5–5.0)
Alkaline Phosphatase: 122 U/L (ref 38–126)
Anion gap: 9 (ref 5–15)
BUN: 30 mg/dL — AB (ref 6–20)
CHLORIDE: 109 mmol/L (ref 101–111)
CO2: 24 mmol/L (ref 22–32)
Calcium: 9.7 mg/dL (ref 8.9–10.3)
Creatinine, Ser: 1.57 mg/dL — ABNORMAL HIGH (ref 0.44–1.00)
GFR calc Af Amer: 39 mL/min — ABNORMAL LOW (ref >60)
GFR, EST NON AFRICAN AMERICAN: 34 mL/min — AB (ref >60)
Glucose, Bld: 133 mg/dL — ABNORMAL HIGH (ref 65–99)
POTASSIUM: 4.1 mmol/L (ref 3.5–5.1)
Sodium: 142 mmol/L (ref 135–145)
Total Bilirubin: 0.6 mg/dL (ref 0.3–1.2)
Total Protein: 7.8 g/dL (ref 6.5–8.1)

## 2015-11-20 LAB — URINE DRUG SCREEN, QUALITATIVE (ARMC ONLY)
AMPHETAMINES, UR SCREEN: NOT DETECTED
BENZODIAZEPINE, UR SCRN: NOT DETECTED
Barbiturates, Ur Screen: NOT DETECTED
COCAINE METABOLITE, UR ~~LOC~~: NOT DETECTED
Cannabinoid 50 Ng, Ur ~~LOC~~: NOT DETECTED
MDMA (Ecstasy)Ur Screen: NOT DETECTED
METHADONE SCREEN, URINE: NOT DETECTED
OPIATE, UR SCREEN: NOT DETECTED
Phencyclidine (PCP) Ur S: NOT DETECTED
TRICYCLIC, UR SCREEN: NOT DETECTED

## 2015-11-20 LAB — ETHANOL

## 2015-11-20 NOTE — ED Provider Notes (Signed)
[facility] Emergency Department Provider Note   ____________________________________________  Time seen: ~2210  I have reviewed the triage vital signs and the nursing notes.   HISTORY  Chief Complaint Insomnia   History limited by: Not Limited   HPI Gina [name] is a 65 y.o. female with history of bipolar who presents to the emergency department because of concern for insomnia. The patient states that she has never been a good sleeper but feels like it has been worse recently. She states she was started on a medication however it does not seem to be working. The patient also states that she is unsure how to check her sugars and that she is diabetic. She states that she has not had any fevers or chest pain recently. No nausea or vomiting recently. Patient denies any suicidal ideation.   Past Medical History  Diagnosis Date  . Bipolar depression [facility])     sees psychiatrist - psych admission 03/2015  . DJD (degenerative joint disease), lumbar     chronic lower back pain  . Obesity (BMI 30-39.9)   . History of colon cancer     s/p surgery  . 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 OSA on CPAP     6cm H2O  . RBBB   . Anemia in chronic kidney disease   . Blood transfusion without reported diagnosis 2009  . Cataract     left  . GERD (gastroesophageal reflux disease)   . Hyperlipidemia   . Hypertension   . History of bladder cancer 1997  . History of uterine cancer     s/p hysterectomy  . CKD (chronic kidney disease) stage 3, GFR 30-59 ml/min   . Personal history of colonic adenomas and colon cancer 11/11/2012  . IDA (iron deficiency anemia) 01/2013    thought due to h/o polyps  . Positive hepatitis C antibody test 09/2014    but negative confirmatory testing  . Colon cancer ([place]) 1990's  . Family history of breast cancer   . Family history of colon cancer   . Family history of stomach cancer   . Uncontrolled  type 2 diabetes mellitus with nephropathy ([place]) 06/07/2014    Patient Active Problem List   Diagnosis Date Noted  . Pedal edema 05/18/2015  . Poor historian 05/18/2015  . High risk social situation 05/10/2015  . Nasal laceration 04/20/2015  . Acute on chronic kidney failure ([place]) 03/30/2015  . Acute-on-chronic renal failure ([place]) 03/30/2015  . Calculus of gallbladder 03/28/2015  . NASH (nonalcoholic steatohepatitis) 03/28/2015  . Gout 03/27/2015  . HCV antibody positive 03/27/2015  . History of neoplasm of bladder 03/27/2015  . History of prolonged Q-T interval on ECG 03/27/2015  . Colonic constipation 03/27/2015  . Elevation of level of transaminase or lactic acid dehydrogenase (LDH) 03/27/2015  . Personal history of other diseases of the circulatory system 03/27/2015  . Genetic testing 03/21/2015  . Family history of breast cancer   . Family history of colon cancer   . Family history of stomach cancer   . History of anemia 11/27/2014  . Bladder cancer ([place]) 11/27/2014  . History of urinary anomaly 11/27/2014  . Uterine cancer ([place]) 11/27/2014  . Allergic rhinitis 11/03/2014  . Contact dermatitis 11/03/2014  . Advanced care planning/counseling discussion 10/11/2014  . Memory deficit 09/13/2014  . Positive hepatitis C antibody test 09/04/2014  . Abnormal mental state 09/04/2014  . Amnesia 09/04/2014  . Thrombocytopenia ([place]) 08/12/2014  .  Uncontrolled type 2 diabetes mellitus with nephropathy ([place]) 06/07/2014  . HTN (hypertension) 06/07/2014  . Trouble in sleeping 05/10/2014  . Right knee pain 03/16/2014  . Sleep apnea 02/13/2014  . Hyperlipidemia   . IDA (iron deficiency anemia) 01/26/2013  . History of colonic polyps 11/11/2012  . Recurrent falls 10/02/2012  . Nasal congestion 10/02/2012  . Medicare annual wellness visit, subsequent 08/27/2012  . Urine incontinence 08/27/2012  . Therapeutic drug monitoring 08/16/2012  . Recurrent ventral hernia 08/11/2012  . OSA on  CPAP   . Intertrigo 05/27/2012  . CKD (chronic kidney disease) stage 3, GFR 30-59 ml/min   . Obesity, Class II, BMI 35-39.9, with comorbidity ([place])   . Colon cancer ([place]) 04/07/2012  . 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 Bipolar 1 disorder ([place]) 04/07/2012  . H/O malignant neoplasm of rectum, rectosigmoid junction, and anus 08/05/2011    Past Surgical History  Procedure Laterality Date  . Hernia repair  02/04/12  . Partial colectomy  about 2008    for colon cancer  . I & d abdominal wound  02/19/12  . Removal of infected mesh and abdominal wound vac placement  02/24/12  . Reexploration of abdominal wound and allograft placemet  02/26/12  . Partial hysterectomy  1981    uterine cancer, R ovary remains  . Left oophorectomy  2005  . Colonoscopy  07/2009  . Dexa  12/2009    WNL  . Dobutamine stress echo  12/2009    no evidence of ischemia  . Colonoscopy  11/2012    2 tubular adenomas, mild diverticulosis, pending genetic testing for Lynch syndrome [name]) rpt 2 yrs  . Sleep study  02/2014    OSA - AHI 55, nadir 81% [name])  . Breast biopsy Right 01/2014    fibroadenoma  . Colonoscopy  02/2015    TA, diverticulosis, rpt 2 yrs [name])    Current Outpatient Rx  Name  Route  Sig  Dispense  Refill  . azelastine (ASTELIN) 0.1 % nasal spray   Each Nare   Place 1 spray into both nostrils 2 (two) times daily. Use in each nostril as directed   30 mL   11   . Blood Glucose Monitoring Suppl (ACCU-CHEK AVIVA PLUS) w/Device KIT      Check blood sugar once a day in morning before eating and as directed; Dx E11.21 and E11.65   1 kit   0   . Cholecalciferol (VITAMIN D3) 400 UNITS CAPS   Oral   Take 400 Units by mouth daily.         Marland Kitchen EXPIRED: clonazePAM (KLONOPIN) 0.5 MG tablet   Oral   Take 1 tablet (0.5 mg total) by mouth 2 (two) times daily. Patient taking differently: Take 0.5 mg by mouth at bedtime.    60 tablet   4   . glucose blood (ACCU-CHEK AVIVA PLUS) test strip       Check blood sugar once a day in morning before eating and as directed; Dx E11.21 and E11.65   100 each   3   . Lancets 30G MISC      Use to check sugar once daily in the morning before breakfast and as directed. Dx; E11.21 and E11.65   100 each   3   . lovastatin (MEVACOR) 40 MG tablet      TAKE 1 TABLET (40 MG TOTAL) BY MOUTH AT BEDTIME.   90 tablet   3   . metFORMIN (GLUCOPHAGE) 500 MG tablet  Oral   Take 1 tablet (500 mg total) by mouth 2 (two) times daily with a meal.   180 tablet   3   . metoprolol tartrate (LOPRESSOR) 25 MG tablet   Oral   Take 1.5 tablets (37.5 mg total) by mouth 2 (two) times daily.   90 tablet   11   . OLANZapine (ZYPREXA) 20 MG tablet   Oral   Take 1 tablet (20 mg total) by mouth at bedtime.   30 tablet   4   . polyethylene glycol powder (GLYCOLAX/MIRALAX) powder      DISSOLVE ONE CAPFUL (17 GRAMS) IN 4 TO 8 OZ OF FLUID AND TAKE BY MOUTH ONCE DAILY   255 g   6   . trolamine salicylate (ASPERCREME) 10 % cream   Topical   Apply 1 application topically 2 (two) times daily.           Allergies Risperidone and related; Penicillins; Risperdal; Ivp dye; Tetanus toxoids; and Zetia  Family History  Problem Relation Age of Onset  . Colon cancer Mother 45  . Stomach cancer Mother     dx in her 60s?  Marland Kitchen Colon cancer Sister 5    Maternal half sister  . CAD Father     MI  . Diabetes Other     aunts and uncles both sides  . Arthritis Other     strong fmhx  . Mental illness Sister     anxiety/depression  . Breast cancer Maternal Grandmother   . Esophageal cancer Neg Hx   . Rectal cancer Neg Hx     Social History Social History  Substance Use Topics  . Smoking status: Never Smoker   . Smokeless tobacco: Never Used  . Alcohol Use: No    Review of Systems  Constitutional: Negative for fever. Cardiovascular: Negative for chest pain. Respiratory: Negative for shortness of breath. Gastrointestinal: Negative for abdominal  pain, vomiting and diarrhea. Neurological: Negative for headaches, focal weakness or numbness.  10-point ROS otherwise negative.  ____________________________________________   PHYSICAL EXAM:  VITAL SIGNS: ED Triage Vitals  Enc Vitals Group     BP 11/20/15 2001 178/98 mmHg     Pulse Rate 11/20/15 2001 83     Resp 11/20/15 2001 18     Temp 11/20/15 2001 98.3 F (36.8 C)     Temp Source 11/20/15 2001 Oral     SpO2 11/20/15 2001 97 %     Weight 11/20/15 2001 200 lb (90.719 kg)     Height 11/20/15 2001 5' 6"  (1.676 m)   Constitutional: Alert and oriented. Well appearing and in no distress. Eyes: Conjunctivae are normal. PERRL. Normal extraocular movements. ENT   Head: Normocephalic and atraumatic.   Nose: No congestion/rhinnorhea.   Mouth/Throat: Mucous membranes are moist.   Neck: No stridor. Hematological/Lymphatic/Immunilogical: No cervical lymphadenopathy. Cardiovascular: Normal rate, regular rhythm.  No murmurs, rubs, or gallops. Respiratory: Normal respiratory effort without tachypnea nor retractions. Breath sounds are clear and equal bilaterally. No wheezes/rales/rhonchi. Gastrointestinal: Soft and nontender. No distention.  Genitourinary: Deferred Musculoskeletal: Normal range of motion in all extremities. No joint effusions.  No lower extremity tenderness nor edema. Neurologic:  Normal speech and language. No gross focal neurologic deficits are appreciated.  Skin:  Skin is warm, dry and intact. No rash noted. Psychiatric: Denies SI  ____________________________________________    LABS (pertinent positives/negatives)  Labs Reviewed  COMPREHENSIVE METABOLIC PANEL - Abnormal; Notable for the following:    Glucose, Bld 133 (*)  BUN 30 (*)    Creatinine, Ser 1.57 (*)    AST 54 (*)    GFR calc non Af Amer 34 (*)    GFR calc Af Amer 39 (*)    All other components within normal limits  URINALYSIS COMPLETEWITH MICROSCOPIC ([facility] ONLY) - Abnormal;  Notable for the following:    Color, Urine YELLOW (*)    APPearance CLEAR (*)    Hgb urine dipstick 1+ (*)    Squamous Epithelial / LPF 0-5 (*)    All other components within normal limits  ETHANOL  CBC  URINE DRUG SCREEN, QUALITATIVE ([facility] ONLY)     ____________________________________________   EKG  None  ____________________________________________    RADIOLOGY  None  ____________________________________________   PROCEDURES  Procedure(s) performed: None  Critical Care performed: No  ____________________________________________   INITIAL IMPRESSION / ASSESSMENT AND PLAN / ED COURSE  Pertinent labs & imaging results that were available during my care of the patient were reviewed by me and considered in my medical decision making (see chart for details).  Patient presented to the emergency department tonight because of concerns for insomnia. Patient does have history of bipolar. Do have some concern that this might be due to a manic episode however patient is relatively calm emergency department. Denies any SI or HI. At this point I do not feel patient meets IVC requirements. Will have patient be seen by psychiatry.  ____________________________________________   FINAL CLINICAL IMPRESSION(S) / ED DIAGNOSES  Final diagnoses:  Insomnia     Nance Pear, MD 11/20/15 2259

## 2015-11-20 NOTE — ED Notes (Signed)
Pt reports she is having trouble with her medication not allowing her to sleep and that she is forgetting to take her "suger medication." Pt also states she doesn't know how to check her sugar, never has been taught how. Pt states, "Im not able to do anything. I cant sleep, think, I cant do anything, I just lay around." Pt lives with her sister, sister is primary careprovider to pt.

## 2015-11-20 NOTE — ED Notes (Addendum)
Pt states not able to sleep, no BM for 2 days, also states "I do not know how to use my sugar machine".  Pt reading prayer while being triaged states "I am just trying to memorize it". Pt does have a hx bipolar and manic depression, when asked if she felt like this was related to her psychiatric issue patient stated "yes my mind just goes and I need a psychiatrist.  Pt has disorganized thoughts and unable to stay on subject.

## 2015-11-21 ENCOUNTER — Ambulatory Visit: Payer: Medicare Other | Admitting: Psychiatry

## 2015-11-21 DIAGNOSIS — G4700 Insomnia, unspecified: Secondary | ICD-10-CM | POA: Diagnosis not present

## 2015-11-21 MED ORDER — OLANZAPINE 10 MG PO TABS: 20.0000 mg | ORAL_TABLET | Freq: Every day | ORAL | Status: DC

## 2015-11-21 MED ORDER — METOPROLOL TARTRATE 25 MG PO TABS
25.0000 mg | ORAL_TABLET | Freq: Once | ORAL | Status: AC
  Administered 2015-11-21: 25 mg via ORAL
  Filled 2015-11-21: qty 1

## 2015-11-21 MED ORDER — AZELASTINE HCL 0.1 % NA SOLN
1.0000 | Freq: Two times a day (BID) | NASAL | Status: DC
  Filled 2015-11-21: qty 30

## 2015-11-21 MED ORDER — PRAVASTATIN SODIUM 40 MG PO TABS
40.0000 mg | ORAL_TABLET | Freq: Every day | ORAL | Status: DC
  Filled 2015-11-21: qty 1

## 2015-11-21 MED ORDER — OLANZAPINE 20 MG PO TABS: 20.0000 mg | ORAL_TABLET | Freq: Every day | ORAL | Status: DC

## 2015-11-21 MED ORDER — CLONAZEPAM 0.5 MG PO TABS: 0.5000 mg | ORAL_TABLET | Freq: Two times a day (BID) | ORAL | Status: DC

## 2015-11-21 MED ORDER — METFORMIN HCL 500 MG PO TABS
500.0000 mg | ORAL_TABLET | Freq: Two times a day (BID) | ORAL | Status: DC
  Administered 2015-11-21: 500 mg via ORAL
  Filled 2015-11-21: qty 1

## 2015-11-21 MED ORDER — DIPHENHYDRAMINE HCL 25 MG PO CAPS
25.0000 mg | ORAL_CAPSULE | Freq: Once | ORAL | Status: AC
  Administered 2015-11-21: 25 mg via ORAL
  Filled 2015-11-21: qty 1

## 2015-11-21 MED ORDER — POLYETHYLENE GLYCOL 3350 17 G PO PACK: 17.0000 g | PACK | Freq: Every day | ORAL | Status: DC

## 2015-11-21 MED ORDER — CLONAZEPAM 0.5 MG PO TABS: 0.5000 mg | ORAL_TABLET | Freq: Two times a day (BID) | ORAL | Status: DC | PRN

## 2015-11-21 MED ORDER — CHOLECALCIFEROL 10 MCG (400 UNIT) PO TABS
400.0000 [IU] | ORAL_TABLET | Freq: Every day | ORAL | Status: DC
  Filled 2015-11-21: qty 1

## 2015-11-21 NOTE — ED Notes (Signed)
Pt eating breakfast 

## 2015-11-21 NOTE — ED Provider Notes (Signed)
-----------------------------------------   2:42 PM on 11/21/2015 -----------------------------------------   Blood pressure 160/88, pulse 84, temperature 97.7 F (36.5 C), temperature source Oral, resp. rate 16, height 5\' 6"  (1.676 m), weight 200 lb (90.719 kg), SpO2 100 %.  The patient had no acute events since last update.  Calm and cooperative at this time.  Seen and evaluated by Dr. Weber Cooks who gave the patient refill of her medications and recommends discharge to home with outpatient follow-up with psychiatry.    Orbie Pyo, MD 11/21/15 [phone]

## 2015-11-21 NOTE — ED Notes (Signed)
Pt dc home, dc instructions gone over with pt and sister but then pt said she can not read , i stressed numerous times to get to her dr asap, and get hr rx filled to[facility], pt has resources

## 2015-11-21 NOTE — ED Notes (Signed)
Pt calm and cooperative in room. Pt denies sleeping since she arrived, and cannot remember how long it has been since she slept. Pt disorganized in thoughts and articulation of thoughts - starting with sleep, progressing to "sugar diabetes" and how she was supposed to take a class to teach her how to test blood sugar and take medication, to other medications she was supposed to be taking, to travel and "W-A-R" and religion. Pt states she "spends a lot of time in hospitals" and needs to see psychiatrist.

## 2015-11-21 NOTE — Telephone Encounter (Signed)
patient sister advised to go to er

## 2015-11-21 NOTE — ED Provider Notes (Signed)
-----------------------------------------   8:03 AM on 11/21/2015 -----------------------------------------   Blood pressure 142/93, pulse 102, temperature 97.7 F (36.5 C), temperature source Oral, resp. rate 20, height 5' 6"  (1.676 m), weight 200 lb (90.719 kg), SpO2 97 %.  The patient had no acute events since last update.  Calm and cooperative at this time.    Patient to be seen and evaluated by psych     Loney Hering, MD 11/21/15 [phone]

## 2015-11-21 NOTE — ED Notes (Signed)
Pt tearful, crying loudly. Pt provided with medication, see MAR.

## 2015-11-21 NOTE — Consult Note (Signed)
[facility] Face-to-Face Psychiatry Consult   Reason for Consult:  Consult for this 65 year old woman who presented to the emergency room voluntarily complaining of difficulty sleeping. Referring Physician:  Archie Balboa Patient Identification: Gina [name] MRN:  [id number] Principal Diagnosis: Insomnia Diagnosis:   Patient Active Problem List   Diagnosis Date Noted  . Insomnia [G47.00] 11/21/2015  . Pedal edema [R60.0] 05/18/2015  . Poor historian [Z78.9] 05/18/2015  . High risk social situation [Z72.89] 05/10/2015  . Nasal laceration [S01.21XA] 04/20/2015  . Acute on chronic kidney failure ([place]) [N17.9, N18.9] 03/30/2015  . Acute-on-chronic renal failure ([place]) [N17.9, N18.9] 03/30/2015  . Calculus of gallbladder [K80.20] 03/28/2015  . NASH (nonalcoholic steatohepatitis) [K75.81] 03/28/2015  . Gout [M10.9] 03/27/2015  . HCV antibody positive [R89.4] 03/27/2015  . History of neoplasm of bladder [Z87.448] 03/27/2015  . History of prolonged Q-T interval on ECG [Z86.79] 03/27/2015  . Colonic constipation [K59.09] 03/27/2015  . Elevation of level of transaminase or lactic acid dehydrogenase (LDH) [R74.0] 03/27/2015  . Personal history of other diseases of the circulatory system [Z86.79] 03/27/2015  . Genetic testing [Z31.5] 03/21/2015  . Family history of breast cancer [Z80.3]   . Family history of colon cancer [Z80.0]   . Family history of stomach cancer [Z80.0]   . History of anemia [Z86.2] 11/27/2014  . Bladder cancer ([place]) [C67.9] 11/27/2014  . History of urinary anomaly [Z87.448] 11/27/2014  . Uterine cancer ([place]) [C55] 11/27/2014  . Allergic rhinitis [J30.9] 11/03/2014  . Contact dermatitis [L25.9] 11/03/2014  . Advanced care planning/counseling discussion [Z71.89] 10/11/2014  . Memory deficit [R41.3] 09/13/2014  . Positive hepatitis C antibody test [R89.4] 09/04/2014  . Abnormal mental state [R41.82] 09/04/2014  . Amnesia [R41.3] 09/04/2014  . Thrombocytopenia ([place]) [D69.6]  08/12/2014  . Uncontrolled type 2 diabetes mellitus with nephropathy ([place]) [E11.21, E11.65] 06/07/2014  . HTN (hypertension) [I10] 06/07/2014  . Trouble in sleeping [G47.9] 05/10/2014  . Right knee pain [M25.561] 03/16/2014  . Sleep apnea [G47.30] 02/13/2014  . Hyperlipidemia [E78.5]   . IDA (iron deficiency anemia) [D50.9] 01/26/2013  . History of colonic polyps [Z86.010] 11/11/2012  . Recurrent falls [R29.6] 10/02/2012  . Nasal congestion [R09.81] 10/02/2012  . Medicare annual wellness visit, subsequent [Z00.00] 08/27/2012  . Urine incontinence [R32] 08/27/2012  . Therapeutic drug monitoring [Z51.81] 08/16/2012  . Recurrent ventral hernia [K43.2] 08/11/2012  . OSA on CPAP [G47.33]   . Intertrigo [L30.4] 05/27/2012  . CKD (chronic kidney disease) stage 3, GFR 30-59 ml/min [N18.3]   . Obesity, Class II, BMI 35-39.9, with comorbidity ([place]) [E66.01]   . Colon cancer ([place]) [C18.9] 04/07/2012  . Enterocutaneous fistula [K63.2] 04/07/2012  . Bipolar 1 disorder ([place]) [F31.9] 04/07/2012  . H/O malignant neoplasm of rectum, rectosigmoid junction, and anus [Z85.048] 08/05/2011    Total Time spent with patient: 1 hour  Subjective:   Gina [name] is a 65 y.o. female patient admitted with "I'm not too good".  HPI:  Patient interviewed. Chart reviewed. Labs and vitals reviewed. 65 year old woman with a history of bipolar disorder presented voluntarily to the emergency room saying that she is not sleeping well. She tells me that she hasn't slept well in several days. She also complains "I ain't got no mind". She is unable to clarify for me what she means by that. She says that she has no memory care remember anything whatsoever. Pretty much every question that I asked of her she says she can't remember the answer to it. There are something she is certain about. She  absolutely denies any suicidal thoughts. She also says she is not having any hallucinations. She thinks she is probably not  taking her medicine normally. She can't remember her doctor. She said her mood is been feeling a little bit nervous and that she is been running around in circles. Nothing here reported about any dangerous behavior.  Social history: Patient lives with her sister. Not working outside the home. Chronically disabled.  Medical history: Multiple serious long-term medical problems including obesity diabetes chronic kidney failure history of COPD. Patient is still going to see her usual outpatient doctor and doesn't have any particular new physical complaints.  Substance abuse history: Denies any alcohol or drug use now or in the past.  Past Psychiatric History: Long-standing diagnosis of bipolar disorder. She's had one prior hospitalization and I can identify here a couple years ago. She followed up with Dr. Jimmye Norman in our clinic and looks like she was last seen about 3 months ago. Possible she may run out of medicine. She denies that she's ever tried to kill himself in the past denies any history of violence. Was most recently on Zyprexa and clonazepam.  Risk to Self: Is patient at risk for suicide?: No Risk to Others:   Prior Inpatient Therapy:   Prior Outpatient Therapy:    Past Medical History:  Past Medical History  Diagnosis Date  . Bipolar depression [facility])     sees psychiatrist - psych admission 03/2015  . DJD (degenerative joint disease), lumbar     chronic lower back pain  . Obesity (BMI 30-39.9)   . History of colon cancer     s/p surgery  . 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 OSA on CPAP     6cm H2O  . RBBB   . Anemia in chronic kidney disease   . Blood transfusion without reported diagnosis 2009  . Cataract     left  . GERD (gastroesophageal reflux disease)   . Hyperlipidemia   . Hypertension   . History of bladder cancer 1997  . History of uterine cancer     s/p hysterectomy  . CKD (chronic kidney disease) stage 3, GFR 30-59 ml/min    . Personal history of colonic adenomas and colon cancer 11/11/2012  . IDA (iron deficiency anemia) 01/2013    thought due to h/o polyps  . Positive hepatitis C antibody test 09/2014    but negative confirmatory testing  . Colon cancer ([place]) 1990's  . Family history of breast cancer   . Family history of colon cancer   . Family history of stomach cancer   . Uncontrolled type 2 diabetes mellitus with nephropathy ([place]) 06/07/2014    Past Surgical History  Procedure Laterality Date  . Hernia repair  02/04/12  . Partial colectomy  about 2008    for colon cancer  . I & d abdominal wound  02/19/12  . Removal of infected mesh and abdominal wound vac placement  02/24/12  . Reexploration of abdominal wound and allograft placemet  02/26/12  . Partial hysterectomy  1981    uterine cancer, R ovary remains  . Left oophorectomy  2005  . Colonoscopy  07/2009  . Dexa  12/2009    WNL  . Dobutamine stress echo  12/2009    no evidence of ischemia  . Colonoscopy  11/2012    2 tubular adenomas, mild diverticulosis, pending genetic testing for Lynch syndrome [name]) rpt 2 yrs  . Sleep study  02/2014  OSA - AHI 55, nadir 81% [name])  . Breast biopsy Right 01/2014    fibroadenoma  . Colonoscopy  02/2015    TA, diverticulosis, rpt 2 yrs [name])   Family History:  Family History  Problem Relation Age of Onset  . Colon cancer Mother 11  . Stomach cancer Mother     dx in her 64s?  Marland Kitchen Colon cancer Sister 78    Maternal half sister  . CAD Father     MI  . Diabetes Other     aunts and uncles both sides  . Arthritis Other     strong fmhx  . Mental illness Sister     anxiety/depression  . Breast cancer Maternal Grandmother   . Esophageal cancer Neg Hx   . Rectal cancer Neg Hx    Family Psychiatric  History: Patient says she doesn't know of any family history of mental illness or substance abuse problems. Social History:  History  Alcohol Use No     History  Drug Use No    Social History    Social History  . Marital Status: Single    Spouse Name: N/A  . Number of Children: 0  . Years of Education: N/A   Social History Main Topics  . Smoking status: Never Smoker   . Smokeless tobacco: Never Used  . Alcohol Use: No  . Drug Use: No  . Sexual Activity: No   Other Topics Concern  . Not on file   Social History Narrative   Lives with sister, no pets   Occupation: disabled, for bipolar and arthritis   Edu: GED   Activity: take walks   Diet: good water, vegetables daily   Religion: [place], Dr. Jimmye Norman (ph [phone])   Additional Social History:    Allergies:   Allergies  Allergen Reactions  . Risperidone And Related Other (See Comments)    AMS and Memory Loss  . Penicillins Other (See Comments)    Pt states that it knocks her out for awhile.    Marland Kitchen Risperdal [Risperidone] Other (See Comments)    Reaction:  Memory loss/AMS evaluated by Dr Melrose Nakayama [facility] at [place]  . Ivp Dye [Iodinated Diagnostic Agents] Rash  . Tetanus Toxoids Rash       . Zetia [Ezetimibe] Rash    Labs:  Results for orders placed or performed during the hospital encounter of 11/20/15 (from the past 48 hour(s))  Comprehensive metabolic panel     Status: Abnormal   Collection Time: 11/20/15  8:17 PM  Result Value Ref Range   Sodium 142 135 - 145 mmol/L   Potassium 4.1 3.5 - 5.1 mmol/L   Chloride 109 101 - 111 mmol/L   CO2 24 22 - 32 mmol/L   Glucose, Bld 133 (H) 65 - 99 mg/dL   BUN 30 (H) 6 - 20 mg/dL   Creatinine, Ser 1.57 (H) 0.44 - 1.00 mg/dL   Calcium 9.7 8.9 - 10.3 mg/dL   Total Protein 7.8 6.5 - 8.1 g/dL   Albumin 4.6 3.5 - 5.0 g/dL   AST 54 (H) 15 - 41 U/L   ALT 53 14 - 54 U/L   Alkaline Phosphatase 122 38 - 126 U/L   Total Bilirubin 0.6 0.3 - 1.2 mg/dL   GFR calc non Af Amer 34 (L) >60 mL/min   GFR calc Af Amer 39 (L) >60 mL/min    Comment: (NOTE) The eGFR has been  calculated using the CKD EPI equation. This calculation has not  been validated in all clinical situations. eGFR's persistently <60 mL/min signify possible Chronic Kidney Disease.    Anion gap 9 5 - 15  Ethanol (ETOH)     Status: None   Collection Time: 11/20/15  8:17 PM  Result Value Ref Range   Alcohol, Ethyl (B) <5 <5 mg/dL    Comment:        LOWEST DETECTABLE LIMIT FOR SERUM ALCOHOL IS 5 mg/dL FOR MEDICAL PURPOSES ONLY   CBC     Status: None   Collection Time: 11/20/15  8:17 PM  Result Value Ref Range   WBC 8.4 3.6 - 11.0 K/uL   RBC 4.28 3.80 - 5.20 MIL/uL   Hemoglobin 13.3 12.0 - 16.0 g/dL   HCT 38.4 35.0 - 47.0 %   MCV 89.9 80.0 - 100.0 fL   MCH 31.0 26.0 - 34.0 pg   MCHC 34.5 32.0 - 36.0 g/dL   RDW 13.8 11.5 - 14.5 %   Platelets 161 150 - 440 K/uL  Urine Drug Screen, Qualitative ([facility] only)     Status: None   Collection Time: 11/20/15  8:17 PM  Result Value Ref Range   Tricyclic, Ur Screen NONE DETECTED NONE DETECTED   Amphetamines, Ur Screen NONE DETECTED NONE DETECTED   MDMA (Ecstasy)Ur Screen NONE DETECTED NONE DETECTED   Cocaine Metabolite,Ur [place] NONE DETECTED NONE DETECTED   Opiate, Ur Screen NONE DETECTED NONE DETECTED   Phencyclidine (PCP) Ur S NONE DETECTED NONE DETECTED   Cannabinoid 50 Ng, Ur  NONE DETECTED NONE DETECTED   Barbiturates, Ur Screen NONE DETECTED NONE DETECTED   Benzodiazepine, Ur Scrn NONE DETECTED NONE DETECTED   Methadone Scn, Ur NONE DETECTED NONE DETECTED    Comment: (NOTE) 563  Tricyclics, urine               Cutoff 1000 ng/mL 200  Amphetamines, urine             Cutoff 1000 ng/mL 300  MDMA (Ecstasy), urine           Cutoff 500 ng/mL 400  Cocaine Metabolite, urine       Cutoff 300 ng/mL 500  Opiate, urine                   Cutoff 300 ng/mL 600  Phencyclidine (PCP), urine      Cutoff 25 ng/mL 700  Cannabinoid, urine              Cutoff 50 ng/mL 800  Barbiturates, urine             Cutoff 200 ng/mL 900  Benzodiazepine, urine           Cutoff 200 ng/mL 1000 Methadone, urine                Cutoff  300 ng/mL 1100 1200 The urine drug screen provides only a preliminary, unconfirmed 1300 analytical test result and should not be used for non-medical 1400 purposes. Clinical consideration and professional judgment should 1500 be applied to any positive drug screen result due to possible 1600 interfering substances. A more specific alternate chemical method 1700 must be used in order to obtain a confirmed analytical result.  1800 Gas chromato graphy / mass spectrometry (GC/MS) is the preferred 1900 confirmatory method.   Urinalysis complete, with microscopic [facility] only)     Status: Abnormal   Collection Time: 11/20/15  8:17 PM  Result Value Ref Range  Color, Urine YELLOW (A) YELLOW   APPearance CLEAR (A) CLEAR   Glucose, UA NEGATIVE NEGATIVE mg/dL   Bilirubin Urine NEGATIVE NEGATIVE   Ketones, ur NEGATIVE NEGATIVE mg/dL   Specific Gravity, Urine 1.012 1.005 - 1.030   Hgb urine dipstick 1+ (A) NEGATIVE   pH 5.0 5.0 - 8.0   Protein, ur NEGATIVE NEGATIVE mg/dL   Nitrite NEGATIVE NEGATIVE   Leukocytes, UA NEGATIVE NEGATIVE   RBC / HPF 0-5 0 - 5 RBC/hpf   WBC, UA 0-5 0 - 5 WBC/hpf   Bacteria, UA NONE SEEN NONE SEEN   Squamous Epithelial / LPF 0-5 (A) NONE SEEN   Mucous PRESENT     Current Facility-Administered Medications  Medication Dose Route Frequency Provider Last Rate Last Dose  . azelastine (ASTELIN) 0.1 % nasal spray 1 spray  1 spray Each Nare BID Orbie Pyo, MD      . cholecalciferol (VITAMIN D) tablet 400 Units  400 Units Oral Daily Orbie Pyo, MD      . clonazePAM [facility] [place]) tablet 0.5 mg  0.5 mg Oral BID Orbie Pyo, MD      . metFORMIN (GLUCOPHAGE) tablet 500 mg  500 mg Oral BID WC Orbie Pyo, MD      . metoprolol tartrate (LOPRESSOR) tablet 25 mg  25 mg Oral Once Orbie Pyo, MD      . OLANZapine [facility]) tablet 20 mg  20 mg Oral QHS Orbie Pyo, MD      . polyethylene glycol [facility]  Floria Raveling) packet 17 g  17 g Oral Daily Orbie Pyo, MD      . pravastatin (PRAVACHOL) tablet 40 mg  40 mg Oral q1800 Orbie Pyo, MD       Current Outpatient Prescriptions  Medication Sig Dispense Refill  . azelastine (ASTELIN) 0.1 % nasal spray Place 1 spray into both nostrils 2 (two) times daily. Use in each nostril as directed 30 mL 11  . Blood Glucose Monitoring Suppl (ACCU-CHEK AVIVA PLUS) w/Device KIT Check blood sugar once a day in morning before eating and as directed; Dx E11.21 and E11.65 1 kit 0  . glucose blood (ACCU-CHEK AVIVA PLUS) test strip Check blood sugar once a day in morning before eating and as directed; Dx E11.21 and E11.65 100 each 3  . Lancets 30G MISC Use to check sugar once daily in the morning before breakfast and as directed. Dx; E11.21 and E11.65 100 each 3  . lovastatin (MEVACOR) 40 MG tablet TAKE 1 TABLET (40 MG TOTAL) BY MOUTH AT BEDTIME. 90 tablet 3  . metFORMIN (GLUCOPHAGE) 500 MG tablet Take 1 tablet (500 mg total) by mouth 2 (two) times daily with a meal. 180 tablet 3  . metoprolol tartrate (LOPRESSOR) 25 MG tablet Take 1.5 tablets (37.5 mg total) by mouth 2 (two) times daily. 90 tablet 11  . OLANZapine (ZYPREXA) 20 MG tablet Take 1 tablet (20 mg total) by mouth at bedtime. 30 tablet 4  . polyethylene glycol powder (GLYCOLAX/MIRALAX) powder DISSOLVE ONE CAPFUL (17 GRAMS) IN 4 TO 8 OZ OF FLUID AND TAKE BY MOUTH ONCE DAILY 255 g 6  . clonazePAM (KLONOPIN) 0.5 MG tablet Take 1 tablet (0.5 mg total) by mouth 2 (two) times daily. (Patient taking differently: Take 0.5 mg by mouth at bedtime. ) 60 tablet 4  . clonazePAM (KLONOPIN) 0.5 MG tablet Take 1 tablet (0.5 mg total) by mouth 2 (two) times daily as needed for anxiety. 60 tablet 0  .  OLANZapine (ZYPREXA) 20 MG tablet Take 1 tablet (20 mg total) by mouth at bedtime. 30 tablet 0    Musculoskeletal: Strength & Muscle Tone: within normal limits Gait & Station: normal Patient leans:  N/A  Psychiatric Specialty Exam: Review of Systems  Constitutional: Negative.   HENT: Negative.   Eyes: Negative.   Respiratory: Negative.   Cardiovascular: Negative.   Gastrointestinal: Negative.   Musculoskeletal: Negative.   Skin: Negative.   Neurological: Negative.   Psychiatric/Behavioral: Positive for memory loss. Negative for depression, suicidal ideas, hallucinations and substance abuse. The patient is nervous/anxious and has insomnia.     Blood pressure 142/93, pulse 102, temperature 97.7 F (36.5 C), temperature source Oral, resp. rate 20, height 5' 6"  (1.676 m), weight 90.719 kg (200 lb), SpO2 97 %.Body mass index is 32.3 kg/(m^2).  General Appearance: Fairly Groomed  Engineer, water::  Minimal  Speech:  Normal Rate  Volume:  Normal  Mood:  Anxious  Affect:  Flat  Thought Process:  Tangential  Orientation:  Full (Time, Place, and Person)  Thought Content:  Negative  Suicidal Thoughts:  No  Homicidal Thoughts:  No  Memory:  Immediate;   Good Recent;   Fair Remote;   Poor  Judgement:  Impaired  Insight:  Shallow  Psychomotor Activity:  Normal  Concentration:  Fair  Recall:  [employer] of Knowledge:Fair  Language: Fair  Akathisia:  No  Handed:  Right  AIMS (if indicated):     Assets:  Desire for Improvement Financial Resources/Insurance Housing Resilience Social Support  ADL's:  Intact  Cognition: Impaired,  Mild  Sleep:      Treatment Plan Summary: Medication management and Plan 65 year old woman with a history of bipolar disorder. From my impression of her if she also strikes me as being possibly borderline cognitively impaired or possibly autistic. Patient is complaining of poor sleep. Sounds like she may have run out of her medicine. There is no evidence here of suicidal or homicidal or dangerous behavior. I've written prescriptions for her olanzapine 20 mg at night and her clonazepam 0.5 mg twice a day for a month. Patient needs outpatient follow-up with a  psychiatrist in the community. No evidence to support commitment. DC IVC. She can be discharged home with her family.  Disposition: Patient does not meet criteria for psychiatric inpatient admission. Supportive therapy provided about ongoing stressors.  Alethia Berthold, MD 11/21/2015 2:07 PM

## 2015-11-21 NOTE — Discharge Instructions (Signed)
Bipolar Disorder Bipolar disorder is a mental illness. The term bipolar disorder actually is used to describe a group of disorders that all share varying degrees of emotional highs and lows that can interfere with daily functioning, such as work, school, or relationships. Bipolar disorder also can lead to drug abuse, hospitalization, and suicide. The emotional highs of bipolar disorder are periods of elation or irritability and high energy. These highs can range from a mild form (hypomania) to a severe form (mania). People experiencing episodes of hypomania may appear energetic, excitable, and highly productive. People experiencing mania may behave impulsively or erratically. They often make poor decisions. They may have difficulty sleeping. The most severe episodes of mania can involve having very distorted beliefs or perceptions about the world and seeing or hearing things that are not real (psychotic delusions and hallucinations).  The emotional lows of bipolar disorder (depression) also can range from mild to severe. Severe episodes of bipolar depression can involve psychotic delusions and hallucinations. Sometimes people with bipolar disorder experience a state of mixed mood. Symptoms of hypomania or mania and depression are both present during this mixed-mood episode. SIGNS AND SYMPTOMS There are signs and symptoms of the episodes of hypomania and mania as well as the episodes of depression. The signs and symptoms of hypomania and mania are similar but vary in severity. They include:  Inflated self-esteem or feeling of increased self-confidence.  Decreased need for sleep.  Unusual talkativeness (rapid or pressured speech) or the feeling of a need to keep talking.  Sensation of racing thoughts or constant talking, with quick shifts between topics that may or may not be related (flight of ideas).  Decreased ability to focus or concentrate.  Increased purposeful activity, such as work, studies,  or social activity, or nonproductive activity, such as pacing, squirming and fidgeting, or finger and toe tapping.  Impulsive behavior and use of poor judgment, resulting in high-risk activities, such as having unprotected sex or spending excessive amounts of money. Signs and symptoms of depression include the following:   Feelings of sadness, hopelessness, or helplessness.  Frequent or uncontrollable episodes of crying.  Lack of feeling anything or caring about anything.  Difficulty sleeping or sleeping too much.  Inability to enjoy the things you used to enjoy.   Desire to be alone all the time.   Feelings of guilt or worthlessness.  Lack of energy or motivation.   Difficulty concentrating, remembering, or making decisions.  Change in appetite or weight beyond normal fluctuations.  Thoughts of death or the desire to harm yourself. DIAGNOSIS  Bipolar disorder is diagnosed through an assessment by your caregiver. Your caregiver will ask questions about your emotional episodes. There are two main types of bipolar disorder. People with type I bipolar disorder have manic episodes with or without depressive episodes. People with type II bipolar disorder have hypomanic episodes and major depressive episodes, which are more serious than mild depression. The type of bipolar disorder you have can make an important difference in how your illness is monitored and treated. Your caregiver may ask questions about your medical history and use of alcohol or drugs, including prescription medication. Certain medical conditions and substances also can cause emotional highs and lows that resemble bipolar disorder (secondary bipolar disorder).  TREATMENT  Bipolar disorder is a long-term illness. It is best controlled with continuous treatment rather than treatment only when symptoms occur. The following treatments can be prescribed for bipolar disorders:  Medication--Medication can be prescribed by  a doctor that  is an expert in treating mental disorders (psychiatrists). Medications called mood stabilizers are usually prescribed to help control the illness. Other medications are sometimes added if symptoms of mania, depression, or psychotic delusions and hallucinations occur despite the use of a mood stabilizer. °· Talk therapy--Some forms of talk therapy are helpful in providing support, education, and guidance. °A combination of medication and talk therapy is best for managing the disorder over time. A procedure in which electricity is applied to your brain through your scalp (electroconvulsive therapy) is used in cases of severe mania when medication and talk therapy do not work or work too slowly. °  °This information is not intended to replace advice given to you by your health care provider. Make sure you discuss any questions you have with your health care provider. °  °Document Released: 10/27/2000 Document Revised: 08/11/2014 Document Reviewed: 08/16/2012 °Elsevier Interactive Patient Education ©2016 [employer] ° °

## 2015-11-21 NOTE — ED Notes (Signed)
Pt sister called to pick up pt she will be here in 1 hr

## 2015-11-22 ENCOUNTER — Ambulatory Visit (INDEPENDENT_AMBULATORY_CARE_PROVIDER_SITE_OTHER): Payer: Medicare Other | Admitting: Psychiatry

## 2015-11-22 ENCOUNTER — Encounter: Payer: Self-pay | Admitting: Psychiatry

## 2015-11-22 ENCOUNTER — Telehealth: Payer: Self-pay | Admitting: Psychiatry

## 2015-11-22 VITALS — BP 128/78 | HR 85 | Temp 97.8°F | Ht 66.0 in | Wt 197.0 lb

## 2015-11-22 DIAGNOSIS — F3160 Bipolar disorder, current episode mixed, unspecified: Secondary | ICD-10-CM | POA: Diagnosis not present

## 2015-11-22 MED ORDER — CLONAZEPAM 0.5 MG PO TABS: 0.2500 mg | ORAL_TABLET | Freq: Two times a day (BID) | ORAL | Status: DC

## 2015-11-22 MED ORDER — LAMOTRIGINE 25 MG PO TABS: 25.0000 mg | ORAL_TABLET | Freq: Every day | ORAL | Status: DC

## 2015-11-22 MED ORDER — OLANZAPINE 10 MG PO TBDP: 10.0000 mg | ORAL_TABLET | Freq: Every day | ORAL | Status: DC
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
## 2015-11-22 NOTE — Telephone Encounter (Signed)
Will await pt to be seen in office as I have not seen her. She was following Dr Jimmye Norman.

## 2015-11-22 NOTE — Telephone Encounter (Signed)
Pt is being seen today

## 2015-11-23 ENCOUNTER — Telehealth: Payer: Self-pay

## 2015-11-23 NOTE — Telephone Encounter (Signed)
Pt having problems with working the accu check aviva plus;pt has no transportation and I transferred pt to [place] at [employer] who will assist pt.

## 2015-11-24 ENCOUNTER — Encounter: Payer: Self-pay | Admitting: Family Medicine

## 2015-11-26 ENCOUNTER — Telehealth: Payer: Self-pay | Admitting: *Deleted

## 2015-11-26 ENCOUNTER — Ambulatory Visit: Payer: Medicare Other | Admitting: Psychiatry

## 2015-11-26 ENCOUNTER — Encounter: Payer: Self-pay | Admitting: Psychiatry

## 2015-11-26 ENCOUNTER — Ambulatory Visit (INDEPENDENT_AMBULATORY_CARE_PROVIDER_SITE_OTHER): Payer: Medicare Other | Admitting: Psychiatry

## 2015-11-26 VITALS — BP 122/68 | HR 85 | Temp 98.9°F | Ht 66.0 in | Wt 198.6 lb

## 2015-11-26 DIAGNOSIS — F3160 Bipolar disorder, current episode mixed, unspecified: Secondary | ICD-10-CM

## 2015-11-26 DIAGNOSIS — Z79899 Other long term (current) drug therapy: Secondary | ICD-10-CM

## 2015-11-26 DIAGNOSIS — R296 Repeated falls: Secondary | ICD-10-CM

## 2015-11-26 DIAGNOSIS — F319 Bipolar disorder, unspecified: Secondary | ICD-10-CM

## 2015-11-26 NOTE — Telephone Encounter (Signed)
Patient's sister Lelan Pons) called stating that the psychiatrist is suppose to get in touch with you about getting her some home health assistance. Lelan Pons stated that patient needs someone to help her with her medications and teach her how to check her blood sugar with a machine. Lelan Pons stated that he sister is suppose to see a dietician this Thursday and she knows that she is not going for that appointment so she needs to call and cancel it. Lelan Pons request a call back to let her know how they are going to get her sister some help.

## 2015-11-26 NOTE — Progress Notes (Signed)
BH MD/PA/NP OP Progress Note  11/26/2015 9:26 AM Gina [name]  MRN:  [id number]  Subjective:  Patient is a 65 year old female who presented for the follow-up appointment. She has recently seen on Friday. Patient was sent again by her sister as her sister reported that she is not doing well. However during the patient's interview, vision appeared calm and reported that she has started taking her medications. She is sleeping well. She is not having any incontinence of her bowels since Friday. She reported that she does not have any auditory or visual hallucinations.  I called her sister who reported that patient has memory problems and she forgets things. Her sister remains adamant that patient needs to stay somewhere else as she cannot take her care of her. She reported that patient needs to go away from there. Her sister was upset about the patient's behavior and due to her own medical issues.  Patient did not exhibit any paranoia or suicidal homicidal ideations or plans. She was very calm and she asked me if she can give me a hug towards the end of the interview.  She reported that she was never married and she does not have any children. She reported that the sister's son lives close by but he does not take care of them. He is very busy in his own job and will not stop by to keep an eye on them. She reported that all the food they have at home is very hard and it is difficult for her to eat as she does not have any teeth. She stated that she takes care of her medications and they are in a pillbox.     Chief Complaint: Okay Chief Complaint    Follow-up; Medication Refill     Visit Diagnosis:     ICD-9-CM ICD-10-CM   1. Bipolar I disorder, most recent episode mixed ([place]) 296.60 F31.60     Past Medical History:  Past Medical History  Diagnosis Date  . Bipolar depression [facility])     sees psychiatrist - psych admission 03/2015  . DJD (degenerative joint disease), lumbar     chronic  lower back pain  . Obesity (BMI 30-39.9)   . History of colon cancer     s/p surgery  . 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 OSA on CPAP     6cm H2O  . RBBB   . Anemia in chronic kidney disease   . Blood transfusion without reported diagnosis 2009  . Cataract     left  . GERD (gastroesophageal reflux disease)   . Hyperlipidemia   . Hypertension   . History of bladder cancer 1997  . History of uterine cancer     s/p hysterectomy  . CKD (chronic kidney disease) stage 3, GFR 30-59 ml/min   . Personal history of colonic adenomas and colon cancer 11/11/2012  . IDA (iron deficiency anemia) 01/2013    thought due to h/o polyps  . Positive hepatitis C antibody test 09/2014    but negative confirmatory testing  . Colon cancer ([place]) 1990's  . Family history of breast cancer   . Family history of colon cancer   . Family history of stomach cancer   . Uncontrolled type 2 diabetes mellitus with nephropathy ([place]) 06/07/2014    Past Surgical History  Procedure Laterality Date  . Hernia repair  02/04/12  . Partial colectomy  about 2008    for colon cancer  . I &  d abdominal wound  02/19/12  . Removal of infected mesh and abdominal wound vac placement  02/24/12  . Reexploration of abdominal wound and allograft placemet  02/26/12  . Partial hysterectomy  1981    uterine cancer, R ovary remains  . Left oophorectomy  2005  . Colonoscopy  07/2009  . Dexa  12/2009    WNL  . Dobutamine stress echo  12/2009    no evidence of ischemia  . Colonoscopy  11/2012    2 tubular adenomas, mild diverticulosis, pending genetic testing for Lynch syndrome [name]) rpt 2 yrs  . Sleep study  02/2014    OSA - AHI 55, nadir 81% [name])  . Breast biopsy Right 01/2014    fibroadenoma  . Colonoscopy  02/2015    TA, diverticulosis, rpt 2 yrs [name])   Family History:  Family History  Problem Relation Age of Onset  . Colon cancer Mother 44  . Stomach cancer Mother     dx in her  72s?  Marland Kitchen Colon cancer Sister 50    Maternal half sister  . CAD Father     MI  . Diabetes Other     aunts and uncles both sides  . Arthritis Other     strong fmhx  . Mental illness Sister     anxiety/depression  . Breast cancer Maternal Grandmother   . Esophageal cancer Neg Hx   . Rectal cancer Neg Hx    Social History:  Social History   Social History  . Marital Status: Single    Spouse Name: N/A  . Number of Children: 0  . Years of Education: N/A   Social History Main Topics  . Smoking status: Never Smoker   . Smokeless tobacco: Never Used  . Alcohol Use: No  . Drug Use: No  . Sexual Activity: No   Other Topics Concern  . None   Social History Narrative   Lives with sister, no pets   Occupation: disabled, for bipolar and arthritis   Edu: GED   Activity: take walks   Diet: good water, vegetables daily   Religion: [place], Dr. Jimmye Norman (ph [phone])   Additional History:   Assessment:   Musculoskeletal: Strength & Muscle Tone: within normal limits Gait & Station: Patient has normal gait but ambulates slowly Patient leans: N/A  Psychiatric Specialty Exam: HPI  Review of Systems  Psychiatric/Behavioral: Negative for depression, suicidal ideas, hallucinations, memory loss and substance abuse. The patient is not nervous/anxious and does not have insomnia.   All other systems reviewed and are negative.   Blood pressure 122/68, pulse 85, temperature 98.9 F (37.2 C), temperature source Tympanic, height 5' 6"  (1.676 m), weight 198 lb 9.6 oz (90.084 kg), SpO2 94 %.Body mass index is 32.07 kg/(m^2).  General Appearance: Well Groomed  Eye Contact:  Good  Speech:  Normal Rate  Volume:  Normal  Mood:  Good  Affect:  Congruent  Thought Process:  Circumstantial  Orientation:  Full (Time, Place, and Person)  Thought Content:  Negative  Suicidal Thoughts:  No  Homicidal Thoughts:  No  Memory:  Immediate;   Good Recent;    Good Remote;   Fair  Judgement:  Good  Insight:  Good  Psychomotor Activity:  Negative  Concentration:  Good  Recall:  [place] of Knowledge: Fair  Language: Good  Akathisia:  Negative  Handed:  Right unknown  AIMS (if indicated):  Normal- Done on 06/22/15 normal   Assets:  Desire for Improvement  ADL's:  Intact  Cognition: WNL  Sleep:  Good   Is the patient at risk to self?  No. Has the patient been a risk to self in the past 6 months?  No. Has the patient been a risk to self within the distant past?  No. Is the patient a risk to others?  No. Has the patient been a risk to others in the past 6 months?  No. Has the patient been a risk to others within the distant past?  No.  Current Medications: Current Outpatient Prescriptions  Medication Sig Dispense Refill  . azelastine (ASTELIN) 0.1 % nasal spray Place 1 spray into both nostrils 2 (two) times daily. Use in each nostril as directed 30 mL 11  . Blood Glucose Monitoring Suppl (ACCU-CHEK AVIVA PLUS) w/Device KIT Check blood sugar once a day in morning before eating and as directed; Dx E11.21 and E11.65 1 kit 0  . clonazePAM (KLONOPIN) 0.5 MG tablet Take 0.5 tablets (0.25 mg total) by mouth 2 (two) times daily. 30 tablet 0  . glucose blood (ACCU-CHEK AVIVA PLUS) test strip Check blood sugar once a day in morning before eating and as directed; Dx E11.21 and E11.65 100 each 3  . lamoTRIgine (LAMICTAL) 25 MG tablet Take 1 tablet (25 mg total) by mouth daily. 30 tablet 0  . Lancets 30G MISC Use to check sugar once daily in the morning before breakfast and as directed. Dx; E11.21 and E11.65 100 each 3  . lovastatin (MEVACOR) 40 MG tablet TAKE 1 TABLET (40 MG TOTAL) BY MOUTH AT BEDTIME. 90 tablet 3  . metFORMIN (GLUCOPHAGE) 500 MG tablet Take 1 tablet (500 mg total) by mouth 2 (two) times daily with a meal. 180 tablet 3  . metoprolol tartrate (LOPRESSOR) 25 MG tablet Take 1.5 tablets (37.5 mg total) by mouth 2 (two) times daily. 90  tablet 11  . OLANZapine zydis (ZYPREXA) 10 MG disintegrating tablet Take 1 tablet (10 mg total) by mouth at bedtime. 30 tablet 0  . polyethylene glycol powder (GLYCOLAX/MIRALAX) powder DISSOLVE ONE CAPFUL (17 GRAMS) IN 4 TO 8 OZ OF FLUID AND TAKE BY MOUTH ONCE DAILY 255 g 6   No current facility-administered medications for this visit.    Medical Decision Making:  Established Problem, Stable/Improving (1)  Treatment Plan Summary:Medication management   Continue medications as prescribed   I will decrease the dose of Zyprexa 10 mg by mouth daily at bedtime and also decrease the dose of Klonopin which is causing her confusion at this time.  I will add lamotrigine 25 mg by mouth daily.  Patient was given written instructions about her medications.  She will follow-up in 2 weeks or earlier depending on her symptoms Have obtained collateral information from her sister and will continue on her same medications. She was advised to call if she notices worsening of her symptoms and she demonstrated understanding   More than 50% of the time spent in psychoeducation, counseling and coordination of care.    This note was generated in part or whole with voice recognition software. Voice regonition is usually quite accurate but there are transcription errors that can and very often do occur. I apologize for any typographical errors that were not detected and corrected.   Rainey Pines, MD  11/26/2015, 9:26 AM

## 2015-11-26 NOTE — Telephone Encounter (Signed)
With recent psych ER visit would see if pt eligible to have renewed [facility] referral for med management and given change in status (increased confusion).  Would encourage she try and keep dietician referral. Why can't she go? [facility] last saw patient 09/2015.

## 2015-11-27 NOTE — Telephone Encounter (Signed)
Spoke with patient's sister. She said that patient can't go to dietician because she is "out of her mind" and is "going crazy", so she won't comprehend anything. She said all she does is pray day and night, doesn't sleep, can't wipe herself, talks out of her head and takes her meds wrong. She doesn't know if [facility] is the way to go or if she should try to have her committed somewhere. She said she called and talked to her psychiatrist and they disagreed with Ms. Darel Hong and said that Ms. Tineo was fine and was doing well. Ms. Darel Hong said that patient is absolutely out of her mind at home and needs some serious help and she just doesn't know what else to do. She is contemplating IVC if you think she should/could pursue this or what else you think she should do.

## 2015-11-28 NOTE — Telephone Encounter (Signed)
IVC is only for if a patient is an imminent danger to herself or others.  I would start with [facility] re-referral.

## 2015-11-28 NOTE — Telephone Encounter (Signed)
Referral placed and message left advising patient's sister.

## 2015-11-29 ENCOUNTER — Other Ambulatory Visit: Payer: Self-pay

## 2015-11-29 ENCOUNTER — Ambulatory Visit: Payer: Medicare Other | Admitting: *Deleted

## 2015-11-29 DIAGNOSIS — E119 Type 2 diabetes mellitus without complications: Secondary | ICD-10-CM

## 2015-11-29 NOTE — Patient Outreach (Addendum)
[facility]) Care Management  11/29/2015  KAHNIYA OVERBAY Jan 27, 1951 [id number]   Telephone Screen  Referral Date: 11/28/15 Referral Source: MD office(Dr. Danise Mina) Referral Reason: " CKD stage 3, medication management, bi-polar disorder, poor historian, high risk social situation, abnormal mental state, memory deficit, multiple falls, uncontrolled diabetes"  Outreach attempt #1 to patient. Patient reached. She also gave consent to speak with her due to difficulty communciating with patient related to mental state. Sister voices her own medical issues and states she is unable to assist and provide care for patient. She reports that she wants patient placed in some type of facility. She shares that she tried to have patient  "involuntarily committed" but was told the patient was no a threat to self or others.   Conditions: Patient has h/o CKD, DM and Bi-polar disease.   Medications: Patient taking less than 15 meds. She states she does not know when and how to take meds. Sister reports that patient is not adherng to med regimen at all which is contributing to her mental state.  Appointments: Patient's sister states that patient recently saw her new psych MD and was told "nothing is wrong with patient." She also reports a recent psych hospitalization.   Consent: Patient and sister both familiar with [facility] services as they have had services in the past. Patient agreeable to [facility] services.   Plan: RN CM will make [facility] SW referral for mental health resources and assistance. RN CM will refer to [place] for polypharmacy med review and medication management.   Enzo Montgomery, RN,BSN,CCM [place] [facility] Telephonic Care Management Coordinator Direct Phone: [phone] Toll Free: [phone] Fax: [phone]

## 2015-11-30 ENCOUNTER — Telehealth: Payer: Self-pay | Admitting: *Deleted

## 2015-11-30 NOTE — Telephone Encounter (Signed)
Noted thank you

## 2015-11-30 NOTE — Telephone Encounter (Signed)
FYI- I had patient come into office today so that I could help her set up weekly pill box to ensure she was dosing herself correctly. When going through the meds she was taking, it was noted that she had been taking a 10mg  and a 20mg  tablet of Zyprexa at bedtime when she should've only been taking a 10mg  tablet. Removed 20mg  tablet from pill box and from medication supply to avoid future confusion. Meds prepared for 1 week and will hopefully last until [facility] can arrive at residence to take over med management. Routed to PCP, Psych and [facility].

## 2015-12-03 ENCOUNTER — Inpatient Hospital Stay
Admission: EM | Admit: 2015-12-03 | Discharge: 2015-12-19 | DRG: 885 | Disposition: A | Discharge status: Home / Self Care | Payer: Medicare Other | Source: Intra-hospital | Attending: Psychiatry | Admitting: Psychiatry

## 2015-12-03 ENCOUNTER — Encounter: Payer: Self-pay | Admitting: Pharmacist

## 2015-12-03 ENCOUNTER — Encounter: Payer: Self-pay | Admitting: Emergency Medicine

## 2015-12-03 ENCOUNTER — Emergency Department
Admission: EM | Admit: 2015-12-03 | Discharge: 2015-12-03 | Disposition: A | Discharge status: Other Acute Inpatient Hospital | Payer: Medicare Other | Attending: Emergency Medicine | Admitting: Emergency Medicine

## 2015-12-03 DIAGNOSIS — Z888 Allergy status to other drugs, medicaments and biological substances status: Secondary | ICD-10-CM

## 2015-12-03 DIAGNOSIS — K219 Gastro-esophageal reflux disease without esophagitis: Secondary | ICD-10-CM | POA: Diagnosis present

## 2015-12-03 DIAGNOSIS — F3112 Bipolar disorder, current episode manic without psychotic features, moderate: Secondary | ICD-10-CM | POA: Diagnosis not present

## 2015-12-03 DIAGNOSIS — G4700 Insomnia, unspecified: Secondary | ICD-10-CM

## 2015-12-03 DIAGNOSIS — Z85038 Personal history of other malignant neoplasm of large intestine: Secondary | ICD-10-CM

## 2015-12-03 DIAGNOSIS — N184 Chronic kidney disease, stage 4 (severe): Secondary | ICD-10-CM | POA: Diagnosis present

## 2015-12-03 DIAGNOSIS — K7581 Nonalcoholic steatohepatitis (NASH): Secondary | ICD-10-CM | POA: Diagnosis present

## 2015-12-03 DIAGNOSIS — M109 Gout, unspecified: Secondary | ICD-10-CM | POA: Diagnosis present

## 2015-12-03 DIAGNOSIS — Z8551 Personal history of malignant neoplasm of bladder: Secondary | ICD-10-CM | POA: Insufficient documentation

## 2015-12-03 DIAGNOSIS — Z9114 Patient's other noncompliance with medication regimen: Secondary | ICD-10-CM | POA: Diagnosis not present

## 2015-12-03 DIAGNOSIS — Z803 Family history of malignant neoplasm of breast: Secondary | ICD-10-CM

## 2015-12-03 DIAGNOSIS — N189 Chronic kidney disease, unspecified: Secondary | ICD-10-CM

## 2015-12-03 DIAGNOSIS — E785 Hyperlipidemia, unspecified: Secondary | ICD-10-CM | POA: Diagnosis not present

## 2015-12-03 DIAGNOSIS — Z8542 Personal history of malignant neoplasm of other parts of uterus: Secondary | ICD-10-CM | POA: Diagnosis not present

## 2015-12-03 DIAGNOSIS — D631 Anemia in chronic kidney disease: Secondary | ICD-10-CM | POA: Diagnosis present

## 2015-12-03 DIAGNOSIS — M1990 Unspecified osteoarthritis, unspecified site: Secondary | ICD-10-CM | POA: Diagnosis present

## 2015-12-03 DIAGNOSIS — E669 Obesity, unspecified: Secondary | ICD-10-CM | POA: Insufficient documentation

## 2015-12-03 DIAGNOSIS — I129 Hypertensive chronic kidney disease with stage 1 through stage 4 chronic kidney disease, or unspecified chronic kidney disease: Secondary | ICD-10-CM | POA: Diagnosis present

## 2015-12-03 DIAGNOSIS — E1122 Type 2 diabetes mellitus with diabetic chronic kidney disease: Secondary | ICD-10-CM | POA: Diagnosis present

## 2015-12-03 DIAGNOSIS — Z79899 Other long term (current) drug therapy: Secondary | ICD-10-CM | POA: Diagnosis not present

## 2015-12-03 DIAGNOSIS — Z800 Family history of malignant neoplasm of digestive organs: Secondary | ICD-10-CM

## 2015-12-03 DIAGNOSIS — I10 Essential (primary) hypertension: Secondary | ICD-10-CM | POA: Diagnosis present

## 2015-12-03 DIAGNOSIS — N183 Chronic kidney disease, stage 3 (moderate): Secondary | ICD-10-CM | POA: Diagnosis present

## 2015-12-03 DIAGNOSIS — R451 Restlessness and agitation: Secondary | ICD-10-CM | POA: Diagnosis not present

## 2015-12-03 DIAGNOSIS — Z86010 Personal history of colonic polyps: Secondary | ICD-10-CM

## 2015-12-03 DIAGNOSIS — F319 Bipolar disorder, unspecified: Principal | ICD-10-CM | POA: Diagnosis present

## 2015-12-03 DIAGNOSIS — Z9049 Acquired absence of other specified parts of digestive tract: Secondary | ICD-10-CM | POA: Diagnosis not present

## 2015-12-03 DIAGNOSIS — G4733 Obstructive sleep apnea (adult) (pediatric): Secondary | ICD-10-CM | POA: Diagnosis present

## 2015-12-03 DIAGNOSIS — Z7984 Long term (current) use of oral hypoglycemic drugs: Secondary | ICD-10-CM | POA: Insufficient documentation

## 2015-12-03 DIAGNOSIS — E1121 Type 2 diabetes mellitus with diabetic nephropathy: Secondary | ICD-10-CM | POA: Diagnosis present

## 2015-12-03 DIAGNOSIS — Z8249 Family history of ischemic heart disease and other diseases of the circulatory system: Secondary | ICD-10-CM

## 2015-12-03 DIAGNOSIS — R7989 Other specified abnormal findings of blood chemistry: Secondary | ICD-10-CM | POA: Diagnosis present

## 2015-12-03 DIAGNOSIS — Z9989 Dependence on other enabling machines and devices: Secondary | ICD-10-CM

## 2015-12-03 DIAGNOSIS — Z818 Family history of other mental and behavioral disorders: Secondary | ICD-10-CM

## 2015-12-03 DIAGNOSIS — Z90711 Acquired absence of uterus with remaining cervical stump: Secondary | ICD-10-CM | POA: Diagnosis not present

## 2015-12-03 DIAGNOSIS — Z880 Allergy status to penicillin: Secondary | ICD-10-CM

## 2015-12-03 DIAGNOSIS — R7689 Other specified abnormal immunological findings in serum: Secondary | ICD-10-CM | POA: Diagnosis present

## 2015-12-03 DIAGNOSIS — Z91041 Radiographic dye allergy status: Secondary | ICD-10-CM

## 2015-12-03 DIAGNOSIS — Z833 Family history of diabetes mellitus: Secondary | ICD-10-CM

## 2015-12-03 DIAGNOSIS — Z6835 Body mass index (BMI) 35.0-35.9, adult: Secondary | ICD-10-CM | POA: Diagnosis not present

## 2015-12-03 DIAGNOSIS — F23 Brief psychotic disorder: Secondary | ICD-10-CM | POA: Diagnosis not present

## 2015-12-03 DIAGNOSIS — E7220 Disorder of urea cycle metabolism, unspecified: Secondary | ICD-10-CM | POA: Diagnosis present

## 2015-12-03 DIAGNOSIS — R768 Other specified abnormal immunological findings in serum: Secondary | ICD-10-CM | POA: Diagnosis present

## 2015-12-03 DIAGNOSIS — K5900 Constipation, unspecified: Secondary | ICD-10-CM | POA: Diagnosis present

## 2015-12-03 DIAGNOSIS — F3110 Bipolar disorder, current episode manic without psychotic features, unspecified: Secondary | ICD-10-CM | POA: Diagnosis present

## 2015-12-03 DIAGNOSIS — B1920 Unspecified viral hepatitis C without hepatic coma: Secondary | ICD-10-CM | POA: Diagnosis present

## 2015-12-03 DIAGNOSIS — D696 Thrombocytopenia, unspecified: Secondary | ICD-10-CM | POA: Diagnosis present

## 2015-12-03 DIAGNOSIS — K760 Fatty (change of) liver, not elsewhere classified: Secondary | ICD-10-CM | POA: Diagnosis present

## 2015-12-03 DIAGNOSIS — Z90721 Acquired absence of ovaries, unilateral: Secondary | ICD-10-CM

## 2015-12-03 LAB — COMPREHENSIVE METABOLIC PANEL
ALBUMIN: 4 g/dL (ref 3.5–5.0)
ALT: 30 U/L (ref 14–54)
ANION GAP: 10 (ref 5–15)
AST: 32 U/L (ref 15–41)
Alkaline Phosphatase: 93 U/L (ref 38–126)
BUN: 28 mg/dL — ABNORMAL HIGH (ref 6–20)
CHLORIDE: 109 mmol/L (ref 101–111)
CO2: 22 mmol/L (ref 22–32)
Calcium: 9.5 mg/dL (ref 8.9–10.3)
Creatinine, Ser: 1.48 mg/dL — ABNORMAL HIGH (ref 0.44–1.00)
GFR calc non Af Amer: 36 mL/min — ABNORMAL LOW (ref >60)
GFR, EST AFRICAN AMERICAN: 42 mL/min — AB (ref >60)
GLUCOSE: 74 mg/dL (ref 65–99)
POTASSIUM: 4.5 mmol/L (ref 3.5–5.1)
SODIUM: 141 mmol/L (ref 135–145)
Total Bilirubin: 0.7 mg/dL (ref 0.3–1.2)
Total Protein: 7 g/dL (ref 6.5–8.1)

## 2015-12-03 LAB — URINE DRUG SCREEN, QUALITATIVE (ARMC ONLY)
AMPHETAMINES, UR SCREEN: NOT DETECTED
Barbiturates, Ur Screen: NOT DETECTED
Benzodiazepine, Ur Scrn: NOT DETECTED
CANNABINOID 50 NG, UR ~~LOC~~: NOT DETECTED
COCAINE METABOLITE, UR ~~LOC~~: NOT DETECTED
MDMA (Ecstasy)Ur Screen: NOT DETECTED
METHADONE SCREEN, URINE: NOT DETECTED
OPIATE, UR SCREEN: NOT DETECTED
Phencyclidine (PCP) Ur S: NOT DETECTED
TRICYCLIC, UR SCREEN: NOT DETECTED

## 2015-12-03 LAB — CBC
HEMATOCRIT: 34.5 % — AB (ref 35.0–47.0)
HEMOGLOBIN: 11.7 g/dL — AB (ref 12.0–16.0)
MCH: 30.1 pg (ref 26.0–34.0)
MCHC: 34 g/dL (ref 32.0–36.0)
MCV: 88.6 fL (ref 80.0–100.0)
Platelets: 156 10*3/uL (ref 150–440)
RBC: 3.89 MIL/uL (ref 3.80–5.20)
RDW: 13.3 % (ref 11.5–14.5)
WBC: 9.2 10*3/uL (ref 3.6–11.0)

## 2015-12-03 LAB — ETHANOL: Alcohol, Ethyl (B): <5 mg/dL (ref <5)

## 2015-12-03 LAB — ACETAMINOPHEN LEVEL

## 2015-12-03 LAB — SALICYLATE LEVEL

## 2015-12-03 MED ORDER — METFORMIN HCL 500 MG PO TABS
500.0000 mg | ORAL_TABLET | Freq: Two times a day (BID) | ORAL | Status: DC
  Administered 2015-12-04 – 2015-12-06 (×5): 500 mg via ORAL
  Filled 2015-12-03 (×6): qty 1

## 2015-12-03 MED ORDER — OLANZAPINE 10 MG PO TABS
20.0000 mg | ORAL_TABLET | Freq: Every day | ORAL | Status: DC
  Administered 2015-12-03: 20 mg via ORAL
  Filled 2015-12-03: qty 2

## 2015-12-03 MED ORDER — LAMOTRIGINE 25 MG PO TABS
25.0000 mg | ORAL_TABLET | Freq: Every day | ORAL | Status: DC
  Administered 2015-12-04: 25 mg via ORAL
  Filled 2015-12-03 (×2): qty 1

## 2015-12-03 MED ORDER — METFORMIN HCL 500 MG PO TABS
500.0000 mg | ORAL_TABLET | Freq: Two times a day (BID) | ORAL | Status: DC
  Administered 2015-12-03: 500 mg via ORAL
  Filled 2015-12-03: qty 1

## 2015-12-03 MED ORDER — METOPROLOL TARTRATE 25 MG PO TABS
37.5000 mg | ORAL_TABLET | Freq: Two times a day (BID) | ORAL | Status: DC
  Administered 2015-12-03 – 2015-12-07 (×8): 37.5 mg via ORAL
  Filled 2015-12-03 (×2): qty 2
  Filled 2015-12-03: qty 1
  Filled 2015-12-03 (×5): qty 2

## 2015-12-03 MED ORDER — METOPROLOL TARTRATE 25 MG PO TABS: 37.5000 mg | ORAL_TABLET | Freq: Two times a day (BID) | ORAL | Status: DC

## 2015-12-03 MED ORDER — OLANZAPINE 10 MG PO TABS: 20.0000 mg | ORAL_TABLET | Freq: Every day | ORAL | Status: DC

## 2015-12-03 MED ORDER — CLONAZEPAM 0.5 MG PO TABS: 0.2500 mg | ORAL_TABLET | Freq: Two times a day (BID) | ORAL | Status: DC

## 2015-12-03 MED ORDER — POLYETHYLENE GLYCOL 3350 17 G PO PACK
17.0000 g | PACK | Freq: Every day | ORAL | Status: DC
  Administered 2015-12-04 – 2015-12-05 (×2): 17 g via ORAL
  Filled 2015-12-03 (×2): qty 1

## 2015-12-03 MED ORDER — PRAVASTATIN SODIUM 40 MG PO TABS
ORAL_TABLET | ORAL | Status: AC
  Administered 2015-12-03: 20 mg via ORAL
  Filled 2015-12-03: qty 1

## 2015-12-03 MED ORDER — CLONAZEPAM 0.5 MG PO TABS
0.2500 mg | ORAL_TABLET | Freq: Two times a day (BID) | ORAL | Status: DC
  Administered 2015-12-03 – 2015-12-06 (×6): 0.25 mg via ORAL
  Filled 2015-12-03 (×6): qty 1

## 2015-12-03 MED ORDER — ACETAMINOPHEN 325 MG PO TABS
650.0000 mg | ORAL_TABLET | Freq: Four times a day (QID) | ORAL | Status: DC | PRN
  Administered 2015-12-05 – 2015-12-18 (×3): 650 mg via ORAL
  Filled 2015-12-03 (×2): qty 2

## 2015-12-03 MED ORDER — MAGNESIUM HYDROXIDE 400 MG/5ML PO SUSP
30.0000 mL | Freq: Every day | ORAL | Status: DC | PRN
  Administered 2015-12-05 – 2015-12-17 (×3): 30 mL via ORAL
  Filled 2015-12-03 (×4): qty 30

## 2015-12-03 MED ORDER — PRAVASTATIN SODIUM 20 MG PO TABS
20.0000 mg | ORAL_TABLET | Freq: Every day | ORAL | Status: DC
  Administered 2015-12-04 – 2015-12-18 (×15): 20 mg via ORAL
  Filled 2015-12-03 (×15): qty 1

## 2015-12-03 MED ORDER — LAMOTRIGINE 25 MG PO TABS
25.0000 mg | ORAL_TABLET | Freq: Every day | ORAL | Status: DC
  Administered 2015-12-03: 25 mg via ORAL
  Filled 2015-12-03: qty 1

## 2015-12-03 MED ORDER — ALUM & MAG HYDROXIDE-SIMETH 200-200-20 MG/5ML PO SUSP
30.0000 mL | ORAL | Status: DC | PRN
  Administered 2015-12-05 – 2015-12-16 (×6): 30 mL via ORAL
  Filled 2015-12-03 (×8): qty 30

## 2015-12-03 MED ORDER — OLANZAPINE 10 MG PO TABS: 10.0000 mg | ORAL_TABLET | Freq: Every day | ORAL | Status: DC

## 2015-12-03 MED ORDER — POLYETHYLENE GLYCOL 3350 17 G PO PACK
17.0000 g | PACK | Freq: Every day | ORAL | Status: DC
  Administered 2015-12-03: 17 g via ORAL
  Filled 2015-12-03: qty 1

## 2015-12-03 MED ORDER — PRAVASTATIN SODIUM 20 MG PO TABS
20.0000 mg | ORAL_TABLET | Freq: Every day | ORAL | Status: DC
  Administered 2015-12-03: 20 mg via ORAL
  Filled 2015-12-03: qty 1

## 2015-12-03 MED ORDER — AZELASTINE HCL 0.1 % NA SOLN
1.0000 | Freq: Two times a day (BID) | NASAL | Status: DC
  Administered 2015-12-03 – 2015-12-06 (×6): 1 via NASAL
  Filled 2015-12-03: qty 30

## 2015-12-03 NOTE — Consult Note (Signed)
[facility] Consult   Reason for Consult:  Consult for this 65 year old woman with a history of bipolar disorder who presents to the emergency room with worsening symptoms Gina [name] Referring Physician:  Cinda Quest Patient Identification: Gina [name] MRN:  [id number] Principal Diagnosis: Bipolar 1 disorder [facility]) Diagnosis:   Patient Active Problem List   Diagnosis Date Noted  . Diabetes ([place]) [E11.9] 12/03/2015  . Constipation [K59.00] 12/03/2015  . Insomnia [G47.00] 11/21/2015  . Pedal edema [R60.0] 05/18/2015  . Poor historian [Z78.9] 05/18/2015  . High risk social situation [Z72.89] 05/10/2015  . Nasal laceration [S01.21XA] 04/20/2015  . Acute on chronic kidney failure ([place]) [N17.9, N18.9] 03/30/2015  . Acute-on-chronic renal failure ([place]) [N17.9, N18.9] 03/30/2015  . Calculus of gallbladder [K80.20] 03/28/2015  . NASH (nonalcoholic steatohepatitis) [K75.81] 03/28/2015  . Gout [M10.9] 03/27/2015  . HCV antibody positive [R89.4] 03/27/2015  . History of neoplasm of bladder [Z87.448] 03/27/2015  . History of prolonged Q-T interval on ECG [Z86.79] 03/27/2015  . Colonic constipation [K59.09] 03/27/2015  . Elevation of level of transaminase or lactic acid dehydrogenase (LDH) [R74.0] 03/27/2015  . Personal history of other diseases of the circulatory system [Z86.79] 03/27/2015  . Genetic testing [Z31.5] 03/21/2015  . Family history of breast cancer [Z80.3]   . Family history of colon cancer [Z80.0]   . Family history of stomach cancer [Z80.0]   . History of anemia [Z86.2] 11/27/2014  . Bladder cancer ([place]) [C67.9] 11/27/2014  . History of urinary anomaly [Z87.448] 11/27/2014  . Uterine cancer ([place]) [C55] 11/27/2014  . Allergic rhinitis [J30.9] 11/03/2014  . Contact dermatitis [L25.9] 11/03/2014  . Advanced care planning/counseling discussion [Z71.89] 10/11/2014  . Memory deficit [R41.3] 09/13/2014  . Positive hepatitis C antibody test [R89.4] 09/04/2014  .  Abnormal mental state [R41.82] 09/04/2014  . Amnesia [R41.3] 09/04/2014  . Thrombocytopenia ([place]) [D69.6] 08/12/2014  . Uncontrolled type 2 diabetes mellitus with nephropathy ([place]) [E11.21, E11.65] 06/07/2014  . HTN (hypertension) [I10] 06/07/2014  . Trouble in sleeping [G47.9] 05/10/2014  . Right knee pain [M25.561] 03/16/2014  . Sleep apnea [G47.30] 02/13/2014  . Hyperlipidemia [E78.5]   . IDA (iron deficiency anemia) [D50.9] 01/26/2013  . History of colonic polyps [Z86.010] 11/11/2012  . Recurrent falls [R29.6] 10/02/2012  . Nasal congestion [R09.81] 10/02/2012  . Medicare annual wellness visit, subsequent [Z00.00] 08/27/2012  . Urine incontinence [R32] 08/27/2012  . Therapeutic drug monitoring [Z51.81] 08/16/2012  . Recurrent ventral hernia [K43.2] 08/11/2012  . OSA on CPAP [G47.33]   . Intertrigo [L30.4] 05/27/2012  . CKD (chronic kidney disease) stage 3, GFR 30-59 ml/min [N18.3]   . Obesity, Class II, BMI 35-39.9, with comorbidity ([place]) [E66.01]   . Colon cancer ([place]) [C18.9] 04/07/2012  . Enterocutaneous fistula [K63.2] 04/07/2012  . HTN (hypertension), benign [I10] 04/07/2012  . Bipolar 1 disorder ([place]) [F31.9] 04/07/2012  . H/O malignant neoplasm of rectum, rectosigmoid junction, and anus [Z85.048] 08/05/2011    Total Time spent with patient: 1 hour  Subjective:   Gina [name] is a 65 y.o. female patient admitted with "I'm getting on my sister's nerves".  HPI:  Patient interviewed. Chart reviewed including inpatient and emergency room psychiatric notes. Labs and vitals reviewed. 65 year old woman presented to the emergency room with worsening symptoms of mania. Patient is a little bit lacking as a historian but not completely so. She tells me that she has not been able to sleep at night. Stays up much of the night. This is been going on for weeks and is getting  worse. She describes her mood as being "excited". She denies suicidal or homicidal ideation. She says that  she has been taking her medicines as prescribed but she believes that what she is getting is not the best medicine for her. Patient initially said that part of her complaint was "I don't know what day it is I don't know what year it is I don't know where it is". It turns out that she is mistaken about all of those things, she actually is fully oriented. She denies hallucinations. She denies suicidal thoughts. She is not abusing drugs or alcohol. Patient had previously been seeing Dr. Jimmye Norman until he left our practice. More recently it sounds like she is seeing Dr. Gretel Acre but may not have been as regular with treatment.  Social history: Patient lives with her sister. The patient herself is never been married has no children. She is originally from [place]. Has been here in [place] for several years.  Medical history: Multiple medical problems including chronic diabetes managed with oral medication, high blood pressure, hyperlipidemia, history of some renal insufficiency. She also has chronic constipation probably related to her medicine.  Substance abuse history: Denies ever having any alcohol or drug problems.  Past Psychiatric History: Patient has had chronic bipolar disorder probably lifelong. She says she's had hospitalizations in the past but thinks the last one was in [place]. She denies ever trying to kill her self denies having ever been violent. The medicine that she remembers with the most affection his Thorazine which she asked me for several times.  Risk to Self: Suicidal Ideation: No Suicidal Intent: No Is patient at risk for suicide?: No Suicidal Plan?: No Access to Means: No What has been your use of drugs/alcohol within the last 12 months?: Reports of none How many times?: 0 Other Self Harm Risks: Reports of none Triggers for Past Attempts: None known Intentional Self Injurious Behavior: None Risk to Others: Homicidal Ideation: No Thoughts of Harm to Others:  No Current Homicidal Intent: No Current Homicidal Plan: No Access to Homicidal Means: No Identified Victim: Reports of none History of harm to others?: No Assessment of Violence: None Noted Violent Behavior Description: Reports of none Does patient have access to weapons?: No Criminal Charges Pending?: No Does patient have a court date: No Prior Inpatient Therapy: Prior Inpatient Therapy: Yes Prior Therapy Dates: Unknown Prior Therapy Facilty/Provider(s): Hosptial  Prior Outpatient Therapy: Prior Outpatient Therapy: Yes Prior Therapy Dates: Current Prior Therapy Facilty/Provider(s): [facility] Reason for Treatment: Bipolar Does patient have an ACCT team?: No Does patient have Intensive In-House Services?  : No Does patient have [facility] services? : No Does patient have P4CC services?: No  Past Medical History:  Past Medical History  Diagnosis Date  . Bipolar depression [facility])     sees psychiatrist - psych admission 03/2015  . DJD (degenerative joint disease), lumbar     chronic lower back pain  . Obesity (BMI 30-39.9)   . History of colon cancer     s/p surgery  . 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 OSA on CPAP     6cm H2O  . RBBB   . Anemia in chronic kidney disease   . Blood transfusion without reported diagnosis 2009  . Cataract     left  . GERD (gastroesophageal reflux disease)   . Hyperlipidemia   . Hypertension   . History of bladder cancer 1997  . History of uterine cancer  s/p hysterectomy  . CKD (chronic kidney disease) stage 3, GFR 30-59 ml/min   . Personal history of colonic adenomas and colon cancer 11/11/2012  . IDA (iron deficiency anemia) 01/2013    thought due to h/o polyps  . Positive hepatitis C antibody test 09/2014    but negative confirmatory testing  . Colon cancer ([place]) 1990's  . Family history of breast cancer   . Family history of colon cancer   . Family history of stomach cancer    . Uncontrolled type 2 diabetes mellitus with nephropathy ([place]) 06/07/2014    [facility] DSME 11/2015    Past Surgical History  Procedure Laterality Date  . Hernia repair  02/04/12  . Partial colectomy  about 2008    for colon cancer  . I & d abdominal wound  02/19/12  . Removal of infected mesh and abdominal wound vac placement  02/24/12  . Reexploration of abdominal wound and allograft placemet  02/26/12  . Partial hysterectomy  1981    uterine cancer, R ovary remains  . Left oophorectomy  2005  . Colonoscopy  07/2009  . Dexa  12/2009    WNL  . Dobutamine stress echo  12/2009    no evidence of ischemia  . Colonoscopy  11/2012    2 tubular adenomas, mild diverticulosis, pending genetic testing for Lynch syndrome [name]) rpt 2 yrs  . Sleep study  02/2014    OSA - AHI 55, nadir 81% [name])  . Breast biopsy Right 01/2014    fibroadenoma  . Colonoscopy  02/2015    TA, diverticulosis, rpt 2 yrs [name])   Family History:  Family History  Problem Relation Age of Onset  . Colon cancer Mother 16  . Stomach cancer Mother     dx in her 73s?  Marland Kitchen Colon cancer Sister 53    Maternal half sister  . CAD Father     MI  . Diabetes Other     aunts and uncles both sides  . Arthritis Other     strong fmhx  . Mental illness Sister     anxiety/depression  . Breast cancer Maternal Grandmother   . Esophageal cancer Neg Hx   . Rectal cancer Neg Hx    Family Psychiatric  History: Patient says that she is not aware of any family history of mental health or substance abuse problems Social History:  History  Alcohol Use No     History  Drug Use No    Social History   Social History  . Marital Status: Single    Spouse Name: N/A  . Number of Children: 0  . Years of Education: N/A   Social History Main Topics  . Smoking status: Never Smoker   . Smokeless tobacco: Never Used  . Alcohol Use: No  . Drug Use: No  . Sexual Activity: No   Other Topics Concern  . None   Social History Narrative    Lives with sister, no pets   Occupation: disabled, for bipolar and arthritis   Edu: GED   Activity: take walks   Diet: good water, vegetables daily   Religion: [place], Dr. Jimmye Norman (ph [phone])   Additional Social History:    Allergies:   Allergies  Allergen Reactions  . Risperidone And Related Other (See Comments)    AMS and Memory Loss  . Penicillins Other (See Comments)    Pt states that it knocks her  out for awhile.    Marland Kitchen Risperdal [Risperidone] Other (See Comments)    Reaction:  Memory loss/AMS evaluated by Dr Melrose Nakay[facility] at [place]  . Ivp Dye [Iodinated Diagnostic Agents] Rash  . Tetanus Toxoids Rash       . Zetia [Ezetimibe] Rash    Labs:  Results for orders placed or performed during the hospital encounter of 12/03/15 (from the past 48 hour(s))  Comprehensive metabolic panel     Status: Abnormal   Collection Time: 12/03/15 11:15 AM  Result Value Ref Range   Sodium 141 135 - 145 mmol/L   Potassium 4.5 3.5 - 5.1 mmol/L   Chloride 109 101 - 111 mmol/L   CO2 22 22 - 32 mmol/L   Glucose, Bld 74 65 - 99 mg/dL   BUN 28 (H) 6 - 20 mg/dL   Creatinine, Ser 1.48 (H) 0.44 - 1.00 mg/dL   Calcium 9.5 8.9 - 10.3 mg/dL   Total Protein 7.0 6.5 - 8.1 g/dL   Albumin 4.0 3.5 - 5.0 g/dL   AST 32 15 - 41 U/L   ALT 30 14 - 54 U/L   Alkaline Phosphatase 93 38 - 126 U/L   Total Bilirubin 0.7 0.3 - 1.2 mg/dL   GFR calc non Af Amer 36 (L) >60 mL/min   GFR calc Af Amer 42 (L) >60 mL/min    Comment: (NOTE) The eGFR has been calculated using the CKD EPI equation. This calculation has not been validated in all clinical situations. eGFR's persistently <60 mL/min signify possible Chronic Kidney Disease.    Anion gap 10 5 - 15  Ethanol     Status: None   Collection Time: 12/03/15 11:15 AM  Result Value Ref Range   Alcohol, Ethyl (B) <5 <5 mg/dL    Comment:        LOWEST DETECTABLE LIMIT FOR SERUM ALCOHOL IS 5 mg/dL FOR MEDICAL  PURPOSES ONLY   Salicylate level     Status: None   Collection Time: 12/03/15 11:15 AM  Result Value Ref Range   Salicylate Lvl <1.6 2.8 - 30.0 mg/dL  Acetaminophen level     Status: Abnormal   Collection Time: 12/03/15 11:15 AM  Result Value Ref Range   Acetaminophen (Tylenol), Serum <10 (L) 10 - 30 ug/mL    Comment:        THERAPEUTIC CONCENTRATIONS VARY SIGNIFICANTLY. A RANGE OF 10-30 ug/mL MAY BE AN EFFECTIVE CONCENTRATION FOR MANY PATIENTS. HOWEVER, SOME ARE BEST TREATED AT CONCENTRATIONS OUTSIDE THIS RANGE. ACETAMINOPHEN CONCENTRATIONS >150 ug/mL AT 4 HOURS AFTER INGESTION AND >50 ug/mL AT 12 HOURS AFTER INGESTION ARE OFTEN ASSOCIATED WITH TOXIC REACTIONS.   cbc     Status: Abnormal   Collection Time: 12/03/15 11:15 AM  Result Value Ref Range   WBC 9.2 3.6 - 11.0 K/uL   RBC 3.89 3.80 - 5.20 MIL/uL   Hemoglobin 11.7 (L) 12.0 - 16.0 g/dL   HCT 34.5 (L) 35.0 - 47.0 %   MCV 88.6 80.0 - 100.0 fL   MCH 30.1 26.0 - 34.0 pg   MCHC 34.0 32.0 - 36.0 g/dL   RDW 13.3 11.5 - 14.5 %   Platelets 156 150 - 440 K/uL  Urine Drug Screen, Qualitative     Status: None   Collection Time: 12/03/15 12:18 PM  Result Value Ref Range   Tricyclic, Ur Screen NONE DETECTED NONE DETECTED   Amphetamines, Ur Screen NONE DETECTED NONE DETECTED   MDMA (Ecstasy)Ur Screen NONE DETECTED NONE DETECTED  Cocaine Metabolite,Ur [place] NONE DETECTED NONE DETECTED   Opiate, Ur Screen NONE DETECTED NONE DETECTED   Phencyclidine (PCP) Ur S NONE DETECTED NONE DETECTED   Cannabinoid 50 Ng, Ur [place] NONE DETECTED NONE DETECTED   Barbiturates, Ur Screen NONE DETECTED NONE DETECTED   Benzodiazepine, Ur Scrn NONE DETECTED NONE DETECTED   Methadone Scn, Ur NONE DETECTED NONE DETECTED    Comment: (NOTE) 166  Tricyclics, urine               Cutoff 1000 ng/mL 200  Amphetamines, urine             Cutoff 1000 ng/mL 300  MDMA (Ecstasy), urine           Cutoff 500 ng/mL 400  Cocaine Metabolite, urine       Cutoff 300  ng/mL 500  Opiate, urine                   Cutoff 300 ng/mL 600  Phencyclidine (PCP), urine      Cutoff 25 ng/mL 700  Cannabinoid, urine              Cutoff 50 ng/mL 800  Barbiturates, urine             Cutoff 200 ng/mL 900  Benzodiazepine, urine           Cutoff 200 ng/mL 1000 Methadone, urine                Cutoff 300 ng/mL 1100 1200 The urine drug screen provides only a preliminary, unconfirmed 1300 analytical test result and should not be used for non-medical 1400 purposes. Clinical consideration and professional judgment should 1500 be applied to any positive drug screen result due to possible 1600 interfering substances. A more specific alternate chemical method 1700 must be used in order to obtain a confirmed analytical result.  1800 Gas chromato graphy / mass spectrometry (GC/MS) is the preferred 1900 confirmatory method.     Current Facility-Administered Medications  Medication Dose Route Frequency Provider Last Rate Last Dose  . clonazePAM (KLONOPIN) tablet 0.25 mg  0.25 mg Oral BID [name], MD      . lamoTRIgine (LAMICTAL) tablet 25 mg  25 mg Oral Daily [name], MD      . metFORMIN (GLUCOPHAGE) tablet 500 mg  500 mg Oral BID WC [name], MD      . metoprolol tartrate (LOPRESSOR) tablet 37.5 mg  37.5 mg Oral BID [name], MD      . OLANZapine (ZYPREXA) tablet 20 mg  20 mg Oral QHS John T Clapacs, MD      . polyethylene glycol (MIRALAX / GLYCOLAX) packet 17 g  17 g Oral Daily [name], MD      . pravastatin (PRAVACHOL) tablet 20 mg  20 mg Oral q1800 [name], MD       Current Outpatient Prescriptions  Medication Sig Dispense Refill  . azelastine (ASTELIN) 0.1 % nasal spray Place 1 spray into both nostrils 2 (two) times daily. Use in each nostril as directed 30 mL 11  . clonazePAM (KLONOPIN) 0.5 MG tablet Take 0.5 tablets (0.25 mg total) by mouth 2 (two) times daily. 30 tablet 0  . lamoTRIgine (LAMICTAL) 25 MG tablet Take 1 tablet (25 mg  total) by mouth daily. 30 tablet 0  . lovastatin (MEVACOR) 40 MG tablet TAKE 1 TABLET (40 MG TOTAL) BY MOUTH AT BEDTIME. 90 tablet 3  . metFORMIN (GLUCOPHAGE)  500 MG tablet Take 1 tablet (500 mg total) by mouth 2 (two) times daily with a meal. 180 tablet 3  . metoprolol tartrate (LOPRESSOR) 25 MG tablet Take 1.5 tablets (37.5 mg total) by mouth 2 (two) times daily. 90 tablet 11  . OLANZapine zydis (ZYPREXA) 10 MG disintegrating tablet Take 1 tablet (10 mg total) by mouth at bedtime. 30 tablet 0  . polyethylene glycol powder (GLYCOLAX/MIRALAX) powder DISSOLVE ONE CAPFUL (17 GRAMS) IN 4 TO 8 OZ OF FLUID AND TAKE BY MOUTH ONCE DAILY 255 g 6    Musculoskeletal: Strength & Muscle Tone: within normal limits Gait & Station: normal Patient leans: N/A  Psychiatric Specialty Exam: Review of Systems  Constitutional: Negative.   HENT: Negative.   Eyes: Negative.   Respiratory: Negative.   Cardiovascular: Negative.   Gastrointestinal: Positive for constipation.  Musculoskeletal: Negative.   Skin: Negative.   Neurological: Negative.   Psychiatric/Behavioral: Positive for memory loss. Negative for depression, suicidal ideas, hallucinations and substance abuse. The patient is nervous/anxious and has insomnia.     Blood pressure 118/85, pulse 67, temperature 98.4 F (36.9 C), temperature source Oral, resp. rate 16, height 5' (1.524 m), weight 89.812 kg (198 lb), SpO2 99 %.Body mass index is 38.67 kg/(m^2).  General Appearance: Fairly Groomed  Engineer, water::  Good  Speech:  Pressured  Volume:  Increased  Mood:  Euphoric  Affect:  Labile and Full Range  Thought Process:  Loose and Tangential  Orientation:  Full (Time, Place, and Person)  Thought Content:  Negative  Suicidal Thoughts:  No  Homicidal Thoughts:  No  Memory:  Immediate;   Good Recent;   Fair Remote;   Fair  Judgement:  Fair  Insight:  Fair  Psychomotor Activity:  Normal  Concentration:  Fair  Recall:  [employer] of  Knowledge:Fair  Language: Fair  Akathisia:  No  Handed:  Right  AIMS (if indicated):     Assets:  Communication Skills Desire for Improvement Financial Resources/Insurance Housing Resilience Social Support  ADL's:  Intact  Cognition: WNL  Sleep:      Treatment Plan Summary: Daily contact with patient to assess and evaluate symptoms and progress in treatment, Medication management and Plan 65 year old woman with a history of bipolar disorder. She presents with remarkably pressured speech thought disorder flight of ideas euphoric mood. Sounds like she has become unmanageable at home although there is no evidence that she is violent or suicidal. This is one of several visit she has had to the emergency room indicating that this problem is getting harder to manage at home. She currently appears to still be on olanzapine and lamotrigine. I will admit her to the psychiatry ward. Although she is 65 years old she is fully ambulatory with normal strength and should not be a particular falls risk. Continue current medication 20 mg olanzapine at night especially. Continue medicine for blood pressure diabetes and constipation. Supportive counseling reviewed plan with patient and TTS. Patient agrees to the plan.  Disposition: Recommend psychiatric Inpatient admission when medically cleared. Supportive therapy provided about ongoing stressors.  Alethia Berthold, MD 12/03/2015 4:08 PM

## 2015-12-03 NOTE — Progress Notes (Signed)
Pt admitted to unit from [facility]. She is alert and oriented x4. Pt gait is unsteady. She is provided with yellow socks and educated on fall prevention. When asked why she is here, she states "my medicine. I wasn't taking it right." Pt speech is tangential and hyperreligious. She is easily redirectable and cooperative with assessment and medications. Denies SI/HI/AVH at this time. She c/o chronic knee pain rated a 3/10 and refuses PRN medication. Pt reports that she lives with her sister who is also "very sick. We stick together." Skin assessment performed and no contraband found. Pt is noted to have a large surgical scar on her mid abdomen. Bruises are noted on bilateral knees and a scratch is seen on her right shin. Pt is unable to identify how she got this scratch and bruises. Pt oriented to unit. No questions or concerns at this time. Medications administered with education. q15 minute safety checks maintained. Pt remains free from harm. Will continue to monitor.

## 2015-12-03 NOTE — ED Notes (Signed)
Pt. Alert and oriented, warm and dry, in no distress. Pt. Denies SI, HI, and AVH. Patient to be transferred to the [facility]. Report given to [facility]. Voluntary admission paper signed.  Pt. Encouraged to let nursing staff know of any concerns or needs.

## 2015-12-03 NOTE — ED Notes (Signed)

## 2015-12-03 NOTE — ED Provider Notes (Signed)
[facility] Emergency Department Provider Note  ____________________________________________  Time seen: 1:30 PM  I have reviewed the triage vital signs and the nursing notes.   HISTORY  Chief Complaint Agitation  Level 5 caveat:  Portions of the history and physical were unable to be obtained due to the patient's being a poor historian.    HPI Gina [name] is a 65 y.o. female who reports that she has not slept in 3 days and is feeling agitated. She's not sure what medication she is on and if she is taking them correctly.Denies any chest pain shortness of breath. No SI HI or hallucinations. She does report "I can work miracles" like "multiplying hearts".  She also cites prior as a miracle that she works. She makes frequent religious references.  She also states that she talks loud because "when I talk loud it gives people the gift of hearing"   Past Medical History  Diagnosis Date  . Bipolar depression [facility])     sees psychiatrist - psych admission 03/2015  . DJD (degenerative joint disease), lumbar     chronic lower back pain  . Obesity (BMI 30-39.9)   . History of colon cancer     s/p surgery  . 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 OSA on CPAP     6cm H2O  . RBBB   . Anemia in chronic kidney disease   . Blood transfusion without reported diagnosis 2009  . Cataract     left  . GERD (gastroesophageal reflux disease)   . Hyperlipidemia   . Hypertension   . History of bladder cancer 1997  . History of uterine cancer     s/p hysterectomy  . CKD (chronic kidney disease) stage 3, GFR 30-59 ml/min   . Personal history of colonic adenomas and colon cancer 11/11/2012  . IDA (iron deficiency anemia) 01/2013    thought due to h/o polyps  . Positive hepatitis C antibody test 09/2014    but negative confirmatory testing  . Colon cancer ([place]) 1990's  . Family history of breast cancer   . Family history of colon  cancer   . Family history of stomach cancer   . Uncontrolled type 2 diabetes mellitus with nephropathy ([place]) 06/07/2014    [facility] DSME 11/2015     Patient Active Problem List   Diagnosis Date Noted  . Insomnia 11/21/2015  . Pedal edema 05/18/2015  . Poor historian 05/18/2015  . High risk social situation 05/10/2015  . Nasal laceration 04/20/2015  . Acute on chronic kidney failure ([place]) 03/30/2015  . Acute-on-chronic renal failure ([place]) 03/30/2015  . Calculus of gallbladder 03/28/2015  . NASH (nonalcoholic steatohepatitis) 03/28/2015  . Gout 03/27/2015  . HCV antibody positive 03/27/2015  . History of neoplasm of bladder 03/27/2015  . History of prolonged Q-T interval on ECG 03/27/2015  . Colonic constipation 03/27/2015  . Elevation of level of transaminase or lactic acid dehydrogenase (LDH) 03/27/2015  . Personal history of other diseases of the circulatory system 03/27/2015  . Genetic testing 03/21/2015  . Family history of breast cancer   . Family history of colon cancer   . Family history of stomach cancer   . History of anemia 11/27/2014  . Bladder cancer ([place]) 11/27/2014  . History of urinary anomaly 11/27/2014  . Uterine cancer ([place]) 11/27/2014  . Allergic rhinitis 11/03/2014  . Contact dermatitis 11/03/2014  . Advanced care planning/counseling discussion 10/11/2014  . Memory deficit 09/13/2014  .  Positive hepatitis C antibody test 09/04/2014  . Abnormal mental state 09/04/2014  . Amnesia 09/04/2014  . Thrombocytopenia ([place]) 08/12/2014  . Uncontrolled type 2 diabetes mellitus with nephropathy ([place]) 06/07/2014  . HTN (hypertension) 06/07/2014  . Trouble in sleeping 05/10/2014  . Right knee pain 03/16/2014  . Sleep apnea 02/13/2014  . Hyperlipidemia   . IDA (iron deficiency anemia) 01/26/2013  . History of colonic polyps 11/11/2012  . Recurrent falls 10/02/2012  . Nasal congestion 10/02/2012  . Medicare annual wellness visit, subsequent 08/27/2012  . Urine  incontinence 08/27/2012  . Therapeutic drug monitoring 08/16/2012  . Recurrent ventral hernia 08/11/2012  . OSA on CPAP   . Intertrigo 05/27/2012  . CKD (chronic kidney disease) stage 3, GFR 30-59 ml/min   . Obesity, Class II, BMI 35-39.9, with comorbidity ([place])   . Colon cancer ([place]) 04/07/2012  . 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 Bipolar 1 disorder ([place]) 04/07/2012  . H/O malignant neoplasm of rectum, rectosigmoid junction, and anus 08/05/2011     Past Surgical History  Procedure Laterality Date  . Hernia repair  02/04/12  . Partial colectomy  about 2008    for colon cancer  . I & d abdominal wound  02/19/12  . Removal of infected mesh and abdominal wound vac placement  02/24/12  . Reexploration of abdominal wound and allograft placemet  02/26/12  . Partial hysterectomy  1981    uterine cancer, R ovary remains  . Left oophorectomy  2005  . Colonoscopy  07/2009  . Dexa  12/2009    WNL  . Dobutamine stress echo  12/2009    no evidence of ischemia  . Colonoscopy  11/2012    2 tubular adenomas, mild diverticulosis, pending genetic testing for Lynch syndrome [name]) rpt 2 yrs  . Sleep study  02/2014    OSA - AHI 55, nadir 81% [name])  . Breast biopsy Right 01/2014    fibroadenoma  . Colonoscopy  02/2015    TA, diverticulosis, rpt 2 yrs [name])     Current Outpatient Rx  Name  Route  Sig  Dispense  Refill  . azelastine (ASTELIN) 0.1 % nasal spray   Each Nare   Place 1 spray into both nostrils 2 (two) times daily. Use in each nostril as directed   30 mL   11   . clonazePAM (KLONOPIN) 0.5 MG tablet   Oral   Take 0.5 tablets (0.25 mg total) by mouth 2 (two) times daily.   30 tablet   0   . lamoTRIgine (LAMICTAL) 25 MG tablet   Oral   Take 1 tablet (25 mg total) by mouth daily.   30 tablet   0   . lovastatin (MEVACOR) 40 MG tablet      TAKE 1 TABLET (40 MG TOTAL) BY MOUTH AT BEDTIME.   90 tablet   3   . metFORMIN (GLUCOPHAGE) 500 MG tablet   Oral   Take 1  tablet (500 mg total) by mouth 2 (two) times daily with a meal.   180 tablet   3   . metoprolol tartrate (LOPRESSOR) 25 MG tablet   Oral   Take 1.5 tablets (37.5 mg total) by mouth 2 (two) times daily.   90 tablet   11   . OLANZapine zydis (ZYPREXA) 10 MG disintegrating tablet   Oral   Take 1 tablet (10 mg total) by mouth at bedtime.   30 tablet   0   . polyethylene glycol powder (GLYCOLAX/MIRALAX) powder  DISSOLVE ONE CAPFUL (17 GRAMS) IN 4 TO 8 OZ OF FLUID AND TAKE BY MOUTH ONCE DAILY   255 g   6      Allergies Risperidone and related; Penicillins; Risperdal; Ivp dye; Tetanus toxoids; and Zetia   Family History  Problem Relation Age of Onset  . Colon cancer Mother 32  . Stomach cancer Mother     dx in her 20s?  Marland Kitchen Colon cancer Sister 44    Maternal half sister  . CAD Father     MI  . Diabetes Other     aunts and uncles both sides  . Arthritis Other     strong fmhx  . Mental illness Sister     anxiety/depression  . Breast cancer Maternal Grandmother   . Esophageal cancer Neg Hx   . Rectal cancer Neg Hx     Social History Social History  Substance Use Topics  . Smoking status: Never Smoker   . Smokeless tobacco: Never Used  . Alcohol Use: No    Review of Systems  Constitutional:   No fever or chills.  Eyes:   No vision changes.  ENT:   No sore throat. No rhinorrhea. Cardiovascular:   No chest pain. Respiratory:   No dyspnea or cough. Gastrointestinal:   Negative for abdominal pain, vomiting and diarrhea.  Genitourinary:   Negative for dysuria or difficulty urinating. Musculoskeletal:   Negative for focal pain or swelling Neurological:   Negative for headaches 10-point ROS otherwise negative.  ____________________________________________   PHYSICAL EXAM:  VITAL SIGNS: ED Triage Vitals  Enc Vitals Group     BP 12/03/15 1029 144/86 mmHg     Pulse Rate 12/03/15 1358 67     Resp 12/03/15 1029 18     Temp 12/03/15 1029 98.4 F (36.9 C)      Temp Source 12/03/15 1029 Oral     SpO2 12/03/15 1029 100 %     Weight 12/03/15 1029 198 lb (89.812 kg)     Height 12/03/15 1029 5' (1.524 m)     Head Cir --      Peak Flow --      Pain Score 12/03/15 1030 0     Pain Loc --      Pain Edu? --      Excl. in [place]? --     Vital signs reviewed, nursing assessments reviewed.   Constitutional:   Alert and oriented To person and place. Well appearing and in no distress. Eyes:   No scleral icterus. No conjunctival pallor. PERRL. EOMI.  No nystagmus. ENT   Head:   Normocephalic and atraumatic.   Nose:   No congestion/rhinnorhea. No septal hematoma   Mouth/Throat:   MMM, no pharyngeal erythema. No peritonsillar mass.    Neck:   No stridor. No SubQ emphysema. No meningismus. Hematological/Lymphatic/Immunilogical:   No cervical lymphadenopathy. Cardiovascular:   RRR. Symmetric bilateral radial and DP pulses.  No murmurs.  Respiratory:   Normal respiratory effort without tachypnea nor retractions. Breath sounds are clear and equal bilaterally. No wheezes/rales/rhonchi. Gastrointestinal:   Soft and nontender. Non distended. There is no CVA tenderness.  No rebound, rigidity, or guarding. Genitourinary:   deferred Musculoskeletal:   Nontender with normal range of motion in all extremities. No joint effusions.  No lower extremity tenderness.  No edema. Neurologic:   Normal speech and language.  CN 2-10 normal. Motor grossly intact. No gross focal neurologic deficits are appreciated.  Skin:    Skin is warm, dry  and intact. No rash noted.  No petechiae, purpura, or bullae. Psychiatric: Normal mood.  Somewhat excited affect. Repetitive speech. Hyperreligiosity. She is completing a word search puzzle and is successfully finding words that are spelled forward and backward.  ____________________________________________    LABS (pertinent positives/negatives) (all labs ordered are listed, but only abnormal results are displayed) Labs  Reviewed  COMPREHENSIVE METABOLIC PANEL - Abnormal; Notable for the following:    BUN 28 (*)    Creatinine, Ser 1.48 (*)    GFR calc non Af Amer 36 (*)    GFR calc Af Amer 42 (*)    All other components within normal limits  ACETAMINOPHEN LEVEL - Abnormal; Notable for the following:    Acetaminophen (Tylenol), Serum <10 (*)    All other components within normal limits  CBC - Abnormal; Notable for the following:    Hemoglobin 11.7 (*)    HCT 34.5 (*)    All other components within normal limits  ETHANOL  SALICYLATE LEVEL  URINE DRUG SCREEN, QUALITATIVE ([facility] ONLY)   ____________________________________________   EKG    ____________________________________________    RADIOLOGY    ____________________________________________   PROCEDURES   ____________________________________________   INITIAL IMPRESSION / ASSESSMENT AND PLAN / ED COURSE  Pertinent labs & imaging results that were available during my care of the patient were reviewed by me and considered in my medical decision making (see chart for details).  Patient presents with slight agitation and confusion medications per she has a history of bipolar disorder. Does not appear to be an imminent danger to herself or others. No criteria for involuntary commitment at this time. I'll consult TTS and psychiatry for further evaluation. Creatinine is slightly elevated at 1.4, but she is eating and drinking normally and not in distress. I suspect mild dehydration. We'll encourage fluids.     ____________________________________________   FINAL CLINICAL IMPRESSION(S) / ED DIAGNOSES  Final diagnoses:  Agitation  Insomnia       Portions of this note were generated with dragon dictation software. Dictation errors may occur despite best attempts at proofreading.   Carrie Mew, MD 12/03/15 [phone]

## 2015-12-03 NOTE — ED Notes (Signed)
Resumed care from [place] rn.  Terral worker in with pt now. Pt  calm and cooperative.

## 2015-12-03 NOTE — ED Notes (Signed)
Patient resting quietly in room. No noted distress or abnormal behaviors noted. Will continue 15 minute checks and observation by security camera for safety. 

## 2015-12-03 NOTE — Tx Team (Signed)
Initial Interdisciplinary Treatment Plan   PATIENT STRESSORS: Health problems Medication change or noncompliance   PATIENT STRENGTHS: Communication skills Supportive family/friends   PROBLEM LIST: Problem List/Patient Goals Date to be addressed Date deferred Reason deferred Estimated date of resolution  Medication noncompliance 12/03/15     Mania 12/03/15           "Getting better and getting my memory back so I can take my medications right" 12/03/15                                    DISCHARGE CRITERIA:  Ability to meet basic life and health needs Improved stabilization in mood, thinking, and/or behavior Motivation to continue treatment in a less acute level of care Need for constant or close observation no longer present Verbal commitment to aftercare and medication compliance  PRELIMINARY DISCHARGE PLAN: Attend aftercare/continuing care group Attend PHP/IOP Outpatient therapy  PATIENT/FAMIILY INVOLVEMENT: This treatment plan has been presented to and reviewed with the patient, Gina [name], and/or family member.  The patient and family have been given the opportunity to ask questions and make suggestions.  Erik Obey Parsells 12/03/2015, 9:27 PM

## 2015-12-03 NOTE — ED Notes (Signed)
Dr clapacs in with pt now.

## 2015-12-03 NOTE — ED Notes (Signed)
Pt to ed via ems from home with c/o agitation,  EMS states pt verbalized she has not slept in 3 days.  Pt reports difficulty with taking meds at home and is unsure if she is taking her meds correctly.

## 2015-12-03 NOTE — Progress Notes (Signed)
This encounter was created in error - please disregard.

## 2015-12-03 NOTE — ED Notes (Signed)
Report called to Gerre Ranum in bhu.  Pt calm and cooperative.
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
## 2015-12-03 NOTE — ED Notes (Signed)
Patient served dinner.  She has been quiet, working on word puzzles.  Maintained on 15 minute checks and observation by security camera for safety.

## 2015-12-04 DIAGNOSIS — F3112 Bipolar disorder, current episode manic without psychotic features, moderate: Secondary | ICD-10-CM

## 2015-12-04 LAB — LIPID PANEL
CHOL/HDL RATIO: 2.9 ratio
Cholesterol: 111 mg/dL (ref 0–200)
HDL: 38 mg/dL — AB (ref >40)
LDL CALC: 52 mg/dL (ref 0–99)
TRIGLYCERIDES: 103 mg/dL (ref <150)
VLDL: 21 mg/dL (ref 0–40)

## 2015-12-04 LAB — GLUCOSE, CAPILLARY: Glucose-Capillary: 100 mg/dL — ABNORMAL HIGH (ref 65–99)

## 2015-12-04 LAB — TSH: TSH: 4.262 u[IU]/mL (ref 0.350–4.500)

## 2015-12-04 MED ORDER — DOCUSATE SODIUM 100 MG PO CAPS
200.0000 mg | ORAL_CAPSULE | Freq: Two times a day (BID) | ORAL | Status: DC
  Administered 2015-12-04 – 2015-12-10 (×13): 200 mg via ORAL
  Filled 2015-12-04 (×14): qty 2

## 2015-12-04 MED ORDER — PHENYLEPH-SHARK LIV OIL-MO-PET 0.25-3-14-71.9 % RE OINT
TOPICAL_OINTMENT | Freq: Two times a day (BID) | RECTAL | Status: AC
  Filled 2015-12-04: qty 28.4

## 2015-12-04 MED ORDER — DIVALPROEX SODIUM ER 500 MG PO TB24
1000.0000 mg | ORAL_TABLET | Freq: Every day | ORAL | Status: DC
  Administered 2015-12-04: 1000 mg via ORAL
  Filled 2015-12-04: qty 2

## 2015-12-04 MED ORDER — PHENYLEPH-SHARK LIV OIL-MO-PET 0.25-3-14-71.9 % RE OINT: TOPICAL_OINTMENT | Freq: Two times a day (BID) | RECTAL | Status: DC | PRN

## 2015-12-04 MED ORDER — OLANZAPINE 10 MG PO TABS
30.0000 mg | ORAL_TABLET | Freq: Every day | ORAL | Status: DC
  Administered 2015-12-04 – 2015-12-09 (×6): 30 mg via ORAL
  Filled 2015-12-04 (×5): qty 3

## 2015-12-04 NOTE — BHH Suicide Risk Assessment (Signed)
[facility] Admission Suicide Risk Assessment   Nursing information obtained from:  Patient, Review of record Demographic factors:  Age 66 or older, Caucasian, Low socioeconomic status Current Mental Status:  NA Loss Factors:  Decline in physical health Historical Factors:  NA Risk Reduction Factors:  Religious beliefs about death, Living with another person, especially a relative, Positive coping skills or problem solving skills  Total Time spent with patient: 1 hour Principal Problem: Bipolar disorder, manic, moderate ([place]) Diagnosis:   Patient Active Problem List   Diagnosis Date Noted  . Bipolar disorder, manic, moderate ([place]) [F31.12] 12/03/2015  . Calculus of gallbladder [K80.20] 03/28/2015  . NASH (nonalcoholic steatohepatitis) [K75.81] 03/28/2015  . Gout [M10.9] 03/27/2015  . HCV antibody positive [R89.4] 03/27/2015  . History of prolonged Q-T interval on ECG [Z86.79] 03/27/2015  . Elevation of level of transaminase or lactic acid dehydrogenase (LDH) [R74.0] 03/27/2015  . Bladder cancer ([place]) [C67.9] 11/27/2014  . Uterine cancer ([place]) [C55] 11/27/2014  . Allergic rhinitis [J30.9] 11/03/2014  . Contact dermatitis [L25.9] 11/03/2014  . Memory deficit [R41.3] 09/13/2014  . Positive hepatitis C antibody test [R89.4] 09/04/2014  . Amnesia [R41.3] 09/04/2014  . Thrombocytopenia ([place]) [D69.6] 08/12/2014  . Uncontrolled type 2 diabetes mellitus with nephropathy ([place]) [E11.21, E11.65] 06/07/2014  . Right knee pain [M25.561] 03/16/2014  . Hyperlipidemia [E78.5]   . IDA (iron deficiency anemia) [D50.9] 01/26/2013  . History of colonic polyps [Z86.010] 11/11/2012  . Recurrent falls [R29.6] 10/02/2012  . Urine incontinence [R32] 08/27/2012  . Recurrent ventral hernia [K43.2] 08/11/2012  . OSA on CPAP [G47.33]   . Intertrigo [L30.4] 05/27/2012  . CKD (chronic kidney disease) stage 3, GFR 30-59 ml/min [N18.3]   . Obesity, Class II, BMI 35-39.9, with comorbidity ([place]) [E66.01]   . Colon  cancer ([place]) [C18.9] 04/07/2012  . Enterocutaneous fistula [K63.2] 04/07/2012  . HTN (hypertension), benign [I10] 04/07/2012   Subjective Data:   Continued Clinical Symptoms:  Alcohol Use Disorder Identification Test Final Score (AUDIT): 0 The "Alcohol Use Disorders Identification Test", Guidelines for Use in Primary Care, Second Edition.  World Pharmacologist [facility]). Score between 0-7:  no or low risk or alcohol related problems. Score between 8-15:  moderate risk of alcohol related problems. Score between 16-19:  high risk of alcohol related problems. Score 20 or above:  warrants further diagnostic evaluation for alcohol dependence and treatment.   CLINICAL FACTORS:   Severe Anxiety and/or Agitation Previous Psychiatric Diagnoses and Treatments Medical Diagnoses and Treatments/Surgeries    Psychiatric Specialty Exam: ROS  Blood pressure 145/80, pulse 63, temperature 98.1 F (36.7 C), temperature source Oral, resp. rate 16, height 5\' 6"  (1.676 m), weight 86.637 kg (191 lb), SpO2 97 %.Body mass index is 30.84 kg/(m^2).   COGNITIVE FEATURES THAT CONTRIBUTE TO RISK:  Loss of executive function    SUICIDE RISK:   Moderate:  Frequent suicidal ideation with limited intensity, and duration, some specificity in terms of plans, no associated intent, good self-control, limited dysphoria/symptomatology, some risk factors present, and identifiable protective factors, including available and accessible social support.  PLAN OF CARE: admit to [facility]  I certify that inpatient services furnished can reasonably be expected to improve the patient's condition.   [name], MD 12/04/2015, 9:50 AM

## 2015-12-04 NOTE — BHH Group Notes (Signed)
[facility] Group Notes:  (Nursing/MHT/Case Management/Adjunct)  Date:  12/04/2015  Time:  2:15 PM  Type of Therapy:  Psychoeducational Skills  Participation Level:  Active  Participation Quality:  Intrusive and Monopolizing  Affect:  Flat  Cognitive:  Appropriate  Insight:  Improving  Engagement in Group:  Monopolizing and Off Topic  Modes of Intervention:  Discussion and Education  Summary of Progress/Problems:  Charise Killian 12/04/2015, 2:15 PM

## 2015-12-04 NOTE — Progress Notes (Signed)
Recreation Therapy Notes  Date: 05.02.17 Time: 9:30 am Location: Craft Room  Group Topic: Goal Setting  Goal Area(s) Addresses:  Patient will be able to identify a personal goal. Patient will be able to identify a supportive statement.  Behavioral Response: Did not attend  Intervention: Step By Step  Activity: Patients were given a foot worksheet and instructed to write a goal inside the foot and to write supportive statements outside of the foot.  Education: LRT educated patients on why it is important to set goals.  Education Outcome: Patient did not attend group.  Clinical Observations/Feedback: Patient did not attend group.  Leonette Monarch, LRT/CTRS 12/04/2015 10:09 AM

## 2015-12-04 NOTE — Progress Notes (Signed)
Pt calls staff to room stating "I just had a bowel movement and I think I have hemorrhoids. There is blood." Pt does report a history of hemorrhoids but does not recall the medication she took. MD on call paged.

## 2015-12-04 NOTE — Progress Notes (Signed)
D:  Per pt self inventory pt reports sleeping poor--states that she took sleep medication but it did not help, appetite poor, energy level normal, rates depression "comes and goes", anxiety at a 10 out of 10, denies SI/HI/AVH, goal today: "getting well, pray", pt is hyper religious, disorganized, tangential.     A:  Emotional support provided, Encouraged pt to continue with treatment plan and attend all group activities, q15 min checks maintained for safety.  R:  Pt is receptive, going to groups, pleasant and cooperative with staff and other patients on the unit.

## 2015-12-04 NOTE — H&P (Addendum)
Psychiatric Admission Assessment Adult  Patient Identification: Gina [name] MRN:  [id number] Date of Evaluation:  12/04/2015 Chief Complaint:  Bipolar  Principal Diagnosis: Bipolar disorder, manic, moderate ([place]) Diagnosis:   Patient Active Problem List   Diagnosis Date Noted  . Bipolar disorder, manic, moderate ([place]) [F31.12] 12/03/2015  . NASH (nonalcoholic steatohepatitis) [K75.81] 03/28/2015  . Gout [M10.9] 03/27/2015  . HCV antibody positive [R89.4] 03/27/2015  . Memory deficit [R41.3] 09/13/2014  . Uncontrolled type 2 diabetes mellitus with nephropathy ([place]) [E11.21, E11.65] 06/07/2014  . Right knee pain [M25.561] 03/16/2014  . Hyperlipidemia [E78.5]   . IDA (iron deficiency anemia) [D50.9] 01/26/2013  . History of colonic polyps [Z86.010] 11/11/2012  . Recurrent falls [R29.6] 10/02/2012  . Urine incontinence [R32] 08/27/2012  . Recurrent ventral hernia [K43.2] 08/11/2012  . OSA on CPAP [G47.33]   . Intertrigo [L30.4] 05/27/2012  . CKD (chronic kidney disease) stage 3, GFR 30-59 ml/min [N18.3]   . Obesity, Class II, BMI 35-39.9, with comorbidity ([place]) [E66.01]   . Colon cancer ([place]) [C18.9] 04/07/2012  . Enterocutaneous fistula [K63.2] 04/07/2012  . HTN (hypertension), benign [I10] 04/07/2012   History of Present Illness:  65 year old woman with bipolat disorder type I presented to the emergency room with worsening symptoms of mania on 5/21.  Patient is a poor historian. She tells me that she has not been able to sleep at night. This is been going on for weeks and is getting worse. She describes her mood as being "excited". She says that she has been taking her medicines as prescribed but she believes that what she is getting is not the best medicine for her.  Later on she said at times she was getting confused with her pill box. Thinks that spirits might be "messing with her".  She denies suicidal or homicidal ideation. She denies hallucinations. She denies suicidal  thoughts.   Per ER physician notes: she has not slept in 3 days and is feeling agitated. She's not sure what medication she is on and if she is taking them correctly.She does report "I can work miracles" like "multiplying hearts". She also cites prior as a miracle that she works. She makes frequent religious references.  During assessment pt was hyperverval and very loud, her mood was labile ranging from euphoric to irritable. Thought process was tangential and very difficult to follow.  Patient continues to follow up with [facility].  Currently seen Dr. Gretel Acre there.  Hospitalized here in 2015 due to mania, also hospitalized last year at [facility] for same reason.  Patient lives with her sister but says her sister is having a lot health problems.  Patient states that her sister drives her to all her appointments but lately she is having dizziness and is no longer able to drive.    Substance abuse history: Denies ever having any alcohol or drug problems.  Trauma: this was not explore at this time as she is manic  Associated Signs/Symptoms: Depression Symptoms:  insomnia, loss of energy/fatigue, decreased appetite, (Hypo) Manic Symptoms:  Delusions, Distractibility, Elevated Mood, Impulsivity, Labiality of Mood, Anxiety Symptoms:  Excessive Worry, Psychotic Symptoms:  Delusions, able to make miracles PTSD Symptoms: NA Total Time spent with patient: 1 hour  Past Psychiatric History: Patient carries a diagnosis of bipolar disorder type I. She was in our unit back in 2015 with a mild-to-moderate manic episode. She was discharged on Risperdal and Depakote ER 1500 mg daily at bedtime. Looks a she was hospitalized on [employer]  in  August 2016 for a manic episode. During her hospitalization on [facility] help looks like Depakote was discontinued and the patient was restarted on Tegretol. The patient appears to be that the patient had elevated LFTs and thrombocytopenia. Per  not from her outpatient psychiatrist at [facility] psychiatric associates degree towards never continue. The patient has been in mind obtained on olanzapine 20mg  since at least August 2016.  No history of self injury or suicidal attempts.  Is the patient at risk to self? No.  Has the patient been a risk to self in the past 6 months? No.  Has the patient been a risk to self within the distant past? No.  Is the patient a risk to others? No.  Has the patient been a risk to others in the past 6 months? No.  Has the patient been a risk to others within the distant past? No.   Past Medical History: Patient has a multitude of chronic conditions. She suffers from diabetes, hypertension, dyslipidemia, stage III chronic renal failure, hepatic steatosis, patient also suffers from hepatitis C, has been diagnosed with obstructive sleep apnea and uses a CPAP at home. She is being diagnosed with 3 different types of cancer in her early 30s and 47s. She's been diagnosed with uterine cancer for which she had a hysterectomy which, she was diagnosed with bladder cancer and with colon cancer in the past.  She has had genetic testing and no genetic predisposition for cancer was found. Patient follows up with a lot of our clinic family medicine for primary care her primary care doctor is Ria Bush, she follows up with [facility]mon[facility] Dr[facility] [name], she follows up with Dr. Melrose Nakayama from [facility] to clinic neurology for memory problems.  Past Medical History  Diagnosis Date  . Bipolar depression [facility])     sees psychiatrist - psych admission 03/2015  . DJD (degenerative joint disease), lumbar     chronic lower back pain  . Obesity (BMI 30-39.9)   . History of colon cancer     s/p surgery  . 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 OSA on CPAP     6cm H2O  . RBBB   . Anemia in chronic kidney disease   . Blood transfusion  without reported diagnosis 2009  . Cataract     left  . GERD (gastroesophageal reflux disease)   . Hyperlipidemia   . Hypertension   . History of bladder cancer 1997  . History of uterine cancer     s/p hysterectomy  . CKD (chronic kidney disease) stage 3, GFR 30-59 ml/min   . Personal history of colonic adenomas and colon cancer 11/11/2012  . IDA (iron deficiency anemia) 01/2013    thought due to h/o polyps  . Positive hepatitis C antibody test 09/2014    but negative confirmatory testing  . Colon cancer ([place]) 1990's  . Family history of breast cancer   . Family history of colon cancer   . Family history of stomach cancer   . Uncontrolled type 2 diabetes mellitus with nephropathy ([place]) 06/07/2014    [facility] 11/2015    Past Surgical History  Procedure Laterality Date  . Hernia repair  02/04/12  . Partial colectomy  about 2008    for colon cancer  . I & d abdominal wound  02/19/12  . Removal of infected mesh and abdominal wound vac placement  02/24/12  . Reexploration  of abdominal wound and allograft placemet  02/26/12  . Partial hysterectomy  1981    uterine cancer, R ovary remains  . Left oophorectomy  2005  . Colonoscopy  07/2009  . Dexa  12/2009    WNL  . Dobutamine stress echo  12/2009    no evidence of ischemia  . Colonoscopy  11/2012    2 tubular adenomas, mild diverticulosis, pending genetic testing for Lynch syndrome [name]) rpt 2 yrs  . Sleep study  02/2014    OSA - AHI 55, nadir 81% [name])  . Breast biopsy Right 01/2014    fibroadenoma  . Colonoscopy  02/2015    TA, diverticulosis, rpt 2 yrs [name])   Family History:  Family History  Problem Relation Age of Onset  . Colon cancer Mother 79  . Stomach cancer Mother     dx in her 37s?  Marland Kitchen Colon cancer Sister 61    Maternal half sister  . CAD Father     MI  . Diabetes Other     aunts and uncles both sides  . Arthritis Other     strong fmhx  . Mental illness Sister     anxiety/depression  . Breast cancer  Maternal Grandmother   . Esophageal cancer Neg Hx   . Rectal cancer Neg Hx    Family Psychiatric  History: hx: Sister with depression after loss of husband. Denies any family hx of substance abuse or suicide.  Social History:  Patient is single, never married, doesn't have any children. She has [employer]. She is on disability for bipolar disorder. She is currently living with her sister and her sister's son. She is originally from [place]. Has been here in [place] for several years. History  Alcohol Use No      History  Drug Use No     Allergies:   Allergies  Allergen Reactions  . Risperidone And Related Other (See Comments)    Reaction:  Altered mental status   . Penicillins Other (See Comments)    Pt states that this med knocks her out.  Has patient had a PCN reaction causing immediate rash, facial/tongue/throat swelling, SOB or lightheadedness with hypotension Unsure  Has patient had a PCN reaction causing severe rash involving mucus membranes or skin necrosis Unsure  Has patient had a PCN reaction that required hospitalization Unsure  Has patient had a PCN reaction occurring within the last 10 years Unsure  If all of the above answers are "NO", then may proceed with Cephalosporin use.  Clementeen Hoof [Iodinated Diagnostic Agents] Rash  . Tetanus Toxoids Rash       . Zetia [Ezetimibe] Rash   Lab Results:  Results for orders placed or performed during the hospital encounter of 12/03/15 (from the past 48 hour(s))  Lipid panel     Status: Abnormal   Collection Time: 12/04/15  6:32 AM  Result Value Ref Range   Cholesterol 111 0 - 200 mg/dL   Triglycerides 103 <150 mg/dL   HDL 38 (L) >40 mg/dL   Total CHOL/HDL Ratio 2.9 RATIO   VLDL 21 0 - 40 mg/dL   LDL Cholesterol 52 0 - 99 mg/dL    Comment:        Total Cholesterol/HDL:CHD Risk Coronary Heart Disease Risk Table                     Men   Women  1/2 Average Risk   3.4   3.3  Average Risk       5.0    4.4  2 X Average Risk   9.6   7.1  3 X Average Risk  23.4   11.0        Use the calculated Patient Ratio above and the CHD Risk Table to determine the patient's CHD Risk.        ATP III CLASSIFICATION (LDL):  <100     mg/dL   Optimal  100-129  mg/dL   Near or Above                    Optimal  130-159  mg/dL   Borderline  160-189  mg/dL   High  >190     mg/dL   Very High   TSH     Status: None   Collection Time: 12/04/15  6:32 AM  Result Value Ref Range   TSH 4.262 0.350 - 4.500 uIU/mL  Glucose, capillary     Status: Abnormal   Collection Time: 12/04/15  6:33 AM  Result Value Ref Range   Glucose-Capillary 100 (H) 65 - 99 mg/dL    Blood Alcohol level:  Lab Results  Component Value Date   ETH <5 12/03/2015   ETH <5 [id number]    Metabolic Disorder Labs:  Lab Results  Component Value Date   HGBA1C 12.0* 09/21/2015   MPG 114 04/14/2012   No results found for: PROLACTIN Lab Results  Component Value Date   CHOL 111 12/04/2015   TRIG 103 12/04/2015   HDL 38* 12/04/2015   CHOLHDL 2.9 12/04/2015   VLDL 21 12/04/2015   LDLCALC 52 12/04/2015   LDLCALC 92 08/28/2015    Current Medications: Current Facility-Administered Medications  Medication Dose Route Frequency Provider Last Rate Last Dose  . acetaminophen (TYLENOL) tablet 650 mg  650 mg Oral Q6H PRN [name], MD      . alum & mag hydroxide-simeth (MAALOX/MYLANTA) 200-200-20 MG/5ML suspension 30 mL  30 mL Oral Q4H PRN [name], MD      . azelastine (ASTELIN) 0.1 % nasal spray 1 spray  1 spray Each Nare BID [name], MD   1 spray at 12/04/15 1048  . clonazePAM (KLONOPIN) tablet 0.25 mg  0.25 mg Oral BID [name], MD   0.25 mg at 12/04/15 0805  . divalproex (DEPAKOTE ER) 24 hr tablet 1,000 mg  1,000 mg Oral QHS [name], MD      . docusate sodium (COLACE) capsule 200 mg  200 mg Oral BID [name], MD   200 mg at 12/04/15 1047  . magnesium hydroxide (MILK OF  MAGNESIA) suspension 30 mL  30 mL Oral Daily PRN [name], MD      . metFORMIN (GLUCOPHAGE) tablet 500 mg  500 mg Oral BID WC [name], MD   500 mg at 12/04/15 0804  . metoprolol tartrate (LOPRESSOR) tablet 37.5 mg  37.5 mg Oral BID [name], MD   37.5 mg at 12/04/15 0804  . OLANZapine (ZYPREXA) tablet 30 mg  30 mg Oral QHS [name], MD      . phenylephrine-shark liver oil-mineral oil-petrolatum (PREPARATION H) rectal ointment   Rectal BID [name], MD      . polyethylene glycol (MIRALAX / GLYCOLAX) packet 17 g  17 g Oral Daily [name], MD   17 g at 12/04/15 0805  . pravastatin (PRAVACHOL) tablet 20 mg  20 mg Oral q1800 Jenny Reichmann  T Clapacs, MD       PTA Medications: Prescriptions prior to admission  Medication Sig Dispense Refill Last Dose  . azelastine (ASTELIN) 0.1 % nasal spray Place 1 spray into both nostrils 2 (two) times daily.   unknown at unknown   . clonazePAM (KLONOPIN) 0.5 MG tablet Take 0.5 tablets (0.25 mg total) by mouth 2 (two) times daily. 30 tablet 0 unknown at unknown   . lamoTRIgine (LAMICTAL) 25 MG tablet Take 1 tablet (25 mg total) by mouth daily. 30 tablet 0 unknown at unknown   . lovastatin (MEVACOR) 40 MG tablet Take 40 mg by mouth at bedtime.   unknown at unknown   . metFORMIN (GLUCOPHAGE) 500 MG tablet Take 1 tablet (500 mg total) by mouth 2 (two) times daily with a meal. 180 tablet 3 unknown at unknown   . metoprolol tartrate (LOPRESSOR) 25 MG tablet Take 1.5 tablets (37.5 mg total) by mouth 2 (two) times daily. 90 tablet 11 unknown at unknown   . OLANZapine zydis (ZYPREXA) 10 MG disintegrating tablet Take 1 tablet (10 mg total) by mouth at bedtime. 30 tablet 0 unknown at unknown   . polyethylene glycol (MIRALAX / GLYCOLAX) packet Take 17 g by mouth daily.   unknown at unknown     Musculoskeletal: Strength & Muscle Tone: within normal limits Gait & Station: normal Patient leans: N/A  Psychiatric Specialty  Exam: Physical Exam  Constitutional: She is oriented to person, place, and time. She appears well-developed and well-nourished.  HENT:  Head: Normocephalic and atraumatic.  Eyes: EOM are normal.  Neck: Normal range of motion.  Respiratory: Effort normal.  Musculoskeletal: Normal range of motion.  Neurological: She is alert and oriented to person, place, and time.    Review of Systems  Constitutional: Negative.   HENT: Negative.   Eyes: Negative.   Respiratory: Negative.   Cardiovascular: Negative.   Gastrointestinal: Positive for constipation and blood in stool. Negative for heartburn, nausea, vomiting, abdominal pain, diarrhea and melena.  Genitourinary: Negative.   Musculoskeletal: Negative.   Skin: Negative.   Neurological: Negative.   Endo/Heme/Allergies: Negative.   Psychiatric/Behavioral: Negative for depression, suicidal ideas, hallucinations and substance abuse. The patient has insomnia. The patient is not nervous/anxious.     Blood pressure 145/80, pulse 63, temperature 98.1 F (36.7 C), temperature source Oral, resp. rate 16, height 5\' 6"  (1.676 m), weight 86.637 kg (191 lb), SpO2 97 %.Body mass index is 30.84 kg/(m^2).  General Appearance: Well Groomed  Engineer, water::  Fair  Speech:  Pressured  Volume:  Increased  Mood:  Irritable  Affect:  Labile  Thought Process:  Tangential  Orientation:  Full (Time, Place, and Person)  Thought Content:  Hallucinations: None  Suicidal Thoughts:  No  Homicidal Thoughts:  No  Memory:  Immediate;   Fair Recent;   Fair Remote;   Fair  Judgement:  Impaired  Insight:  Shallow  Psychomotor Activity:  Increased  Concentration:  Poor  Recall:  [place]: Fair  Akathisia:  No  Handed:    AIMS (if indicated):     Assets:  Communication Skills Housing Social Support  ADL's:  Intact  Cognition: WNL  Sleep:  Number of Hours: 4     Treatment Plan Summary:  For bipolar disorder the patient will  be continued on olanzapine I will increase the olanzapine from 20 mg to 30 mg a day. I will reassess start the patient on Depakote. There are many risks associated  with treating her with Depakote as she suffers from a steatosis, and hepatitis C. Depakote level and LFTs will have to be followed up closely. She is not a candidate for other mood stabilizers such as lithium as she suffers from a stage III kidney failure and she is not a candidate for Tegretol as she on a multitude of other medications that can have interactions with Tegretol and Tegretol has to same effects on liver as Depakote.  For insomnia the patient will be continued on clonazepam 0.5 mg qhs  Diabetes the patient will be continued on metformin 500 mg by mouth twice a day  Hypertension the patient will be continued on metoprolol 37.5 mg by mouth daily  Dyslipidemia the patient will be continued on Pravachol 20 mg a day  Constipation the patient will be continue MiraLAX daily and I will add Colace 200 mg by mouth twice a day  Hemorrhoids Order preparation H for the patient to use twice a day for 2 days.  Falls: Patient does not appear to having issues with her gait however per history she has recurrence falls at home. I will place the patient on fall precautions  Diet heart healthy and carb modified  Precautions fall and every 15 minute checks  Hospitalization and status continue voluntary admission  Disposition she will return to her sister's house once stable  Discharge follow-up she'll continue to follow-up with Dr. Gretel Acre in our clinic.  Will check depakote level in about 5 days, along with ammonia, and LFTs  Will order HbA1c, TSH, lipid panel and prolactin  [facility] from [facility] and "chart review" were reviewed  I certify that inpatient services furnished can reasonably be expected to improve the patient's condition.    [name], MD 5/2/201712:03 PM

## 2015-12-05 ENCOUNTER — Ambulatory Visit: Payer: Medicare Other | Admitting: Psychiatry

## 2015-12-05 LAB — HEMOGLOBIN A1C: Hgb A1c MFr Bld: 6.9 % — ABNORMAL HIGH (ref 4.0–6.0)

## 2015-12-05 LAB — GLUCOSE, CAPILLARY: GLUCOSE-CAPILLARY: 102 mg/dL — AB (ref 65–99)

## 2015-12-05 LAB — PROLACTIN: Prolactin: 33.6 ng/mL — ABNORMAL HIGH (ref 4.8–23.3)

## 2015-12-05 MED ORDER — POLYETHYLENE GLYCOL 3350 17 G PO PACK: 17.0000 g | PACK | Freq: Every day | ORAL | Status: DC | PRN

## 2015-12-05 MED ORDER — DIVALPROEX SODIUM ER 500 MG PO TB24
1500.0000 mg | ORAL_TABLET | Freq: Every day | ORAL | Status: DC
  Administered 2015-12-05 – 2015-12-09 (×5): 1500 mg via ORAL
  Filled 2015-12-05 (×6): qty 3

## 2015-12-05 NOTE — Plan of Care (Signed)
Problem: Consults [facility]al[facility] Patient Education Outcome: Progressing Patient open to instruction but requires reinstruction CTownsendRN

## 2015-12-05 NOTE — Progress Notes (Signed)
C/o left leg pain. Pain scale 6/10. Tylenol 650 mg po given as ordered PRN for pain.

## 2015-12-05 NOTE — Progress Notes (Signed)
Calm and cooperative. Tangential with FOI. Hyper religious. Med compliant. Denies SI/HI/AVH. No behavior problems noted. Will continue to monitor for safety and behavior.

## 2015-12-05 NOTE — BHH Group Notes (Signed)
[facility] LCSW Group Therapy  12/05/2015 11:28 AM  Type of Therapy:  Group Therapy  Participation Level:  Active  Participation Quality:  Monopolizing and Redirectable  Affect:  Excited  Cognitive:  Alert and Disorganized  Insight:  Developing/Improving  Engagement in Therapy:  Improving  Modes of Intervention:  Discussion, Socialization and Support  Summary of Progress/Problems: Patient attended group and participated but was hyper-verbal and redirectable. Patient was able to offer supportive feedback to other group members but not really able to talk about her struggle with Balance in Life.   Keene Breath, MSW, LCSW 12/05/2015, 11:28 AM

## 2015-12-05 NOTE — Progress Notes (Signed)
Patient resting quietly in bed with eyes closed. RR even and unlabored. CPAP in place. No s/s of acute resp distress noted. Will continue to monitor

## 2015-12-05 NOTE — Progress Notes (Signed)
D:  Per pt self inventory pt reports sleeping poor, appetite fair, energy level high, rates depression at a 0 out of 10, hopelessness at a 0 out of 10, anxiety at a 9 out of 10, denies SI/HI/AVH, goal today: "getting well, leave in pass, pray and listen", pleasant during interaction, tangential, disorganized, religiously preoccupied.     A:  Emotional support provided, Encouraged pt to continue with treatment plan and attend all group activities, q15 min checks maintained for safety.  R:  Pt is receptive, going to groups, pleasant and cooperative with staff and other patients on the unit. 

## 2015-12-05 NOTE — BHH Group Notes (Signed)
[facility] LCSW Aftercare Discharge Planning Group Note   12/05/2015 11:26 AM  Participation Quality:   Patient attended and participated in group discussion sharing her SMART goal is to "remeber to take my meds". Patient was manic and hyper-verbal but redirectable and pleasant. Patient was very supportive of other group members.   Mood/Affect:  Excited  Thoughts of Suicide:  No Will you contract for safety?   NA  Current AVH:  No  Plan for Discharge/Comments:  Patient will return home at discharge  Transportation Means: patient's sister will pick up  Supports: has family support and an outpatient provider  Keene Breath, MSW, LCSW

## 2015-12-05 NOTE — Progress Notes (Signed)
D: Patient is alert and  tangential on the unit this shift. Patient attended and actively participated in groups today. Patient denies suicidal ideation, homicidal ideation, auditory or visual hallucinations at the present time.  A: Scheduled medications are administered to patient as per MD orders. Emotional support and encouragement are provided. Patient is maintained on q.15 minute safety checks. Patient is informed to notify staff with questions or concerns. R: No adverse medication reactions are noted. Patient is cooperative with medication administration and treatment plan today. Patient is receptive, anxious,cooperative on the unit at this time. Patient interacts well with others on the unit this shift. Patient contracts for safety at this time. Patient remains safe at this time.

## 2015-12-05 NOTE — Progress Notes (Signed)
[facility] MD Progress Note  12/05/2015 3:28 PM Gina [name]  MRN:  [id number] Subjective:  Patient reports insomnia last night. He says that the nurses were her abdomen when they offer her to CPAP. She only was to use his CPAP when needed. Patient denies side effects from medications, denies having suicidality, homicidality or auditory or visual hallucinations. As far as physical complaints says that yesterday she had diarrhea but today she feels constipated. She denies any issues with appetite, energy or concentration. During assessment the patient was his speaking very loudly, her speech was pressured, her thought process was tangential very hard to follow, her mood is euphoric, she is constantly interrupting others and his speaking over others.  She appears to have psychomotor agitation.  No improvement since admission. Compliant with medications last night.  Per nursing: Per pt self inventory pt reports sleeping poor--states that she took sleep medication but it did not help, appetite poor, energy level normal, rates depression "comes and goes", anxiety at a 10 out of 10, denies SI/HI/AVH, goal today: "getting well, pray", pt is hyper religious, disorganized, tangential.   Principal Problem: Bipolar disorder, manic, moderate ([place]) Diagnosis:   Patient Active Problem List   Diagnosis Date Noted  . Bipolar disorder, manic, moderate ([place]) [F31.12] 12/03/2015  . NASH (nonalcoholic steatohepatitis) [K75.81] 03/28/2015  . Gout [M10.9] 03/27/2015  . HCV antibody positive [R89.4] 03/27/2015  . Memory deficit [R41.3] 09/13/2014  . Uncontrolled type 2 diabetes mellitus with nephropathy ([place]) [E11.21, E11.65] 06/07/2014  . Right knee pain [M25.561] 03/16/2014  . Hyperlipidemia [E78.5]   . IDA (iron deficiency anemia) [D50.9] 01/26/2013  . History of colonic polyps [Z86.010] 11/11/2012  . Recurrent falls [R29.6] 10/02/2012  . Urine incontinence [R32] 08/27/2012  . Recurrent ventral hernia [K43.2]  08/11/2012  . OSA on CPAP [G47.33]   . Intertrigo [L30.4] 05/27/2012  . CKD (chronic kidney disease) stage 3, GFR 30-59 ml/min [N18.3]   . Obesity, Class II, BMI 35-39.9, with comorbidity ([place]) [E66.01]   . Colon cancer ([place]) [C18.9] 04/07/2012  . Enterocutaneous fistula [K63.2] 04/07/2012  . HTN (hypertension), benign [I10] 04/07/2012   Total Time spent with patient: 30 minutes    Past Medical History:  Past Medical History  Diagnosis Date  . Bipolar depression [facility])     sees psychiatrist - psych admission 03/2015  . DJD (degenerative joint disease), lumbar     chronic lower back pain  . Obesity (BMI 30-39.9)   . History of colon cancer     s/p surgery  . 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 OSA on CPAP     6cm H2O  . RBBB   . Anemia in chronic kidney disease   . Blood transfusion without reported diagnosis 2009  . Cataract     left  . GERD (gastroesophageal reflux disease)   . Hyperlipidemia   . Hypertension   . History of bladder cancer 1997  . History of uterine cancer     s/p hysterectomy  . CKD (chronic kidney disease) stage 3, GFR 30-59 ml/min   . Personal history of colonic adenomas and colon cancer 11/11/2012  . IDA (iron deficiency anemia) 01/2013    thought due to h/o polyps  . Positive hepatitis C antibody test 09/2014    but negative confirmatory testing  . Colon cancer ([place]) 1990's  . Family history of breast cancer   . Family history of colon cancer   . Family history of stomach cancer   .  Uncontrolled type 2 diabetes mellitus with nephropathy ([place]) 06/07/2014    [facility] DSME 11/2015    Past Surgical History  Procedure Laterality Date  . Hernia repair  02/04/12  . Partial colectomy  about 2008    for colon cancer  . I & d abdominal wound  02/19/12  . Removal of infected mesh and abdominal wound vac placement  02/24/12  . Reexploration of abdominal wound and allograft placemet  02/26/12  . Partial hysterectomy  1981     uterine cancer, R ovary remains  . Left oophorectomy  2005  . Colonoscopy  07/2009  . Dexa  12/2009    WNL  . Dobutamine stress echo  12/2009    no evidence of ischemia  . Colonoscopy  11/2012    2 tubular adenomas, mild diverticulosis, pending genetic testing for Lynch syndrome [name]) rpt 2 yrs  . Sleep study  02/2014    OSA - AHI 55, nadir 81% [name])  . Breast biopsy Right 01/2014    fibroadenoma  . Colonoscopy  02/2015    TA, diverticulosis, rpt 2 yrs [name])   Family History:  Family History  Problem Relation Age of Onset  . Colon cancer Mother 41  . Stomach cancer Mother     dx in her 61s?  Marland Kitchen Colon cancer Sister 62    Maternal half sister  . CAD Father     MI  . Diabetes Other     aunts and uncles both sides  . Arthritis Other     strong fmhx  . Mental illness Sister     anxiety/depression  . Breast cancer Maternal Grandmother   . Esophageal cancer Neg Hx   . Rectal cancer Neg Hx    Family Psychiatric  History:   Social History:  History  Alcohol Use No     History  Drug Use No    Social History   Social History  . Marital Status: Single    Spouse Name: N/A  . Number of Children: 0  . Years of Education: N/A   Social History Main Topics  . Smoking status: Never Smoker   . Smokeless tobacco: Never Used  . Alcohol Use: No  . Drug Use: No  . Sexual Activity: No   Other Topics Concern  . None   Social History Narrative   Lives with sister, no pets   Occupation: disabled, for bipolar and arthritis   Edu: GED   Activity: take walks   Diet: good water, vegetables daily   Religion: [place], Dr. Jimmye Norman (ph [phone])    Sleep: Poor  Appetite:  Good  Current Medications: Current Facility-Administered Medications  Medication Dose Route Frequency Provider Last Rate Last Dose  . acetaminophen (TYLENOL) tablet 650 mg  650 mg Oral Q6H PRN [name], MD   650 mg at 12/05/15 1253  . alum & mag  hydroxide-simeth (MAALOX/MYLANTA) 200-200-20 MG/5ML suspension 30 mL  30 mL Oral Q4H PRN [name], MD      . azelastine (ASTELIN) 0.1 % nasal spray 1 spray  1 spray Each Nare BID [name], MD   1 spray at 12/05/15 [phone]  . clonazePAM (KLONOPIN) tablet 0.25 mg  0.25 mg Oral BID [name], MD   0.25 mg at 12/05/15 0949  . divalproex (DEPAKOTE ER) 24 hr tablet 1,500 mg  1,500 mg Oral QHS [name], MD      .  docusate sodium (COLACE) capsule 200 mg  200 mg Oral BID [name], MD   200 mg at 12/05/15 0949  . magnesium hydroxide (MILK OF MAGNESIA) suspension 30 mL  30 mL Oral Daily PRN [name], MD      . metFORMIN (GLUCOPHAGE) tablet 500 mg  500 mg Oral BID WC [name], MD   500 mg at 12/05/15 0749  . metoprolol tartrate (LOPRESSOR) tablet 37.5 mg  37.5 mg Oral BID [name], MD   37.5 mg at 12/05/15 0949  . OLANZapine (ZYPREXA) tablet 30 mg  30 mg Oral QHS [name], MD   30 mg at 12/04/15 2145  . phenylephrine-shark liver oil-mineral oil-petrolatum (PREPARATION H) rectal ointment   Rectal BID [name], MD      . polyethylene glycol (MIRALAX / GLYCOLAX) packet 17 g  17 g Oral Daily PRN [name], MD      . pravastatin (PRAVACHOL) tablet 20 mg  20 mg Oral q1800 [name], MD   20 mg at 12/04/15 1721    Lab Results:  Results for orders placed or performed during the hospital encounter of 12/03/15 (from the past 48 hour(s))  Hemoglobin A1c     Status: Abnormal   Collection Time: 12/04/15  6:32 AM  Result Value Ref Range   Hgb A1c MFr Bld 6.9 (H) 4.0 - 6.0 %  Lipid panel     Status: Abnormal   Collection Time: 12/04/15  6:32 AM  Result Value Ref Range   Cholesterol 111 0 - 200 mg/dL   Triglycerides 103 <150 mg/dL   HDL 38 (L) >40 mg/dL   Total CHOL/HDL Ratio 2.9 RATIO   VLDL 21 0 - 40 mg/dL   LDL Cholesterol 52 0 - 99 mg/dL    Comment:        Total Cholesterol/HDL:CHD  Risk Coronary Heart Disease Risk Table                     Men   Women  1/2 Average Risk   3.4   3.3  Average Risk       5.0   4.4  2 X Average Risk   9.6   7.1  3 X Average Risk  23.4   11.0        Use the calculated Patient Ratio above and the CHD Risk Table to determine the patient's CHD Risk.        ATP III CLASSIFICATION (LDL):  <100     mg/dL   Optimal  100-129  mg/dL   Near or Above                    Optimal  130-159  mg/dL   Borderline  160-189  mg/dL   High  >190     mg/dL   Very High   Prolactin     Status: Abnormal   Collection Time: 12/04/15  6:32 AM  Result Value Ref Range   Prolactin 33.6 (H) 4.8 - 23.3 ng/mL    Comment: (NOTE) Performed At: [facility] [place], [place] [id number] Lindon Romp MD [id number]   TSH     Status: None   Collection Time: 12/04/15  6:32 AM  Result Value Ref Range   TSH 4.262 0.350 - 4.500 uIU/mL  Glucose, capillary     Status: Abnormal   Collection Time: 12/04/15  6:33 AM  Result Value Ref Range   Glucose-Capillary  100 (H) 65 - 99 mg/dL  Glucose, capillary     Status: Abnormal   Collection Time: 12/05/15  7:48 AM  Result Value Ref Range   Glucose-Capillary 102 (H) 65 - 99 mg/dL    Blood Alcohol level:  Lab Results  Component Value Date   ETH <5 12/03/2015   ETH <5 11/20/2015    Physical Findings: AIMS: Facial and Oral Movements Muscles of Facial Expression: None, normal Lips and Perioral Area: None, normal Jaw: None, normal Tongue: None, normal,Extremity Movements Upper (arms, wrists, hands, fingers): None, normal Lower (legs, knees, ankles, toes): None, normal, Trunk Movements Neck, shoulders, hips: None, normal, Overall Severity Severity of abnormal movements (highest score from questions above): None, normal Incapacitation due to abnormal movements: None, normal Patient's awareness of abnormal movements (rate only patient's report): No Awareness, Dental Status Current problems  with teeth and/or dentures?: No Does patient usually wear dentures?: Yes  CIWA:    COWS:     Musculoskeletal: Strength & Muscle Tone: within normal limits Gait & Station: normal Patient leans: N/A  Psychiatric Specialty Exam: Review of Systems  Constitutional: Negative.   HENT: Negative.   Eyes: Negative.   Respiratory: Negative.   Cardiovascular: Negative.   Gastrointestinal: Negative.   Genitourinary: Negative.   Musculoskeletal: Negative.   Skin: Negative.   Neurological: Negative.   Endo/Heme/Allergies: Negative.   Psychiatric/Behavioral: The patient has insomnia.     Blood pressure 129/63, pulse 59, temperature 98.1 F (36.7 C), temperature source Oral, resp. rate 16, height 5\' 6"  (1.676 m), weight 86.637 kg (191 lb), SpO2 97 %.Body mass index is 30.84 kg/(m^2).  General Appearance: Fairly Groomed  Engineer, water::  Good  Speech:  Pressured  Volume:  Increased  Mood:  Euphoric  Affect:  Labile  Thought Process:  Tangential  Orientation:  Full (Time, Place, and Person)  Thought Content:  Hallucinations: None  Suicidal Thoughts:  No  Homicidal Thoughts:  No  Memory:  Immediate;   Fair Recent;   Fair Remote;   Fair  Judgement:  Impaired  Insight:  Shallow  Psychomotor Activity:  Increased  Concentration:  Poor  Recall:  Poor  Fund of Knowledge:Fair  Language: Good  Akathisia:  No  Handed:    AIMS (if indicated):     Assets:  Communication Skills Social Support  ADL's:  Intact  Cognition: WNL  Sleep:  Number of Hours: 6.3   Treatment Plan Summary:  For bipolar disorder: continue olanzapine 30 mg po q day.  Will increase depakote ER to 1500 mg qhs.     There are many risks associated with treating her with Depakote as she suffers from a steatosis, and hepatitis C. Depakote level and LFTs will have to be followed up closely. She is not a candidate for other mood stabilizers such as lithium as she suffers from a stage III kidney failure and she is not a  candidate for Tegretol as she on a multitude of other medications that can have interactions with Tegretol and Tegretol has  same effects on liver as Depakote.  For insomnia the patient will be continued on clonazepam 0.25 mg qhs  Diabetes the patient will be continued on metformin 500 mg by mouth twice a day  Hypertension the patient will be continued on metoprolol 37.5 mg by mouth daily  Dyslipidemia the patient will be continued on Pravachol 20 mg a day  Constipation the patient will be continued on Colace 200 mg by mouth twice a day.  I will change  miralax to every other day as he is reporting diarrhea yesterday.  OSA: will order CAP prn as she does not want to use it all the time.   Hemorrhoids Order preparation H for the patient to use twice a day for 2 days.  Falls: Patient does not appear to having issues with her gait however per history she has recurrence falls at home. I will place the patient on fall precautions  Diet heart healthy and carb modified  Precautions fall and every 15 minute checks  Hospitalization and status continue voluntary admission  Disposition she will return to her sister's house once stable  Discharge follow-up she'll continue to follow-up with Dr. Gretel Acre in our clinic.  Will check depakote level along with ammonia, and LFTs on monday  Labs HbA1c 6.9, TSH wnl , lipid panel (low HDL) and prolactin mildly elevated   [name], MD 12/05/2015, 3:28 PM

## 2015-12-05 NOTE — Progress Notes (Signed)
Recreation Therapy Notes   Date: 05.03.17 Time: 9:30 am Location: Craft Room  Group Topic: Self-esteem  Goal Area(s) Addresses:  Patient will write positive trait about self. Patient will verbalize benefit of healthy self-esteem.  Behavioral Response: Did not attend  Intervention: Positive Reinforcement  Activity: Patients were given construction paper and encouraged to write as many positive traits about themselves.  Education: LRT educated patients on ways they can increase their self-esteem.  Education Outcome: Patient did not attend group.  Clinical Observations/Feedback: Did not attend  Leonette Monarch, LRT/CTRS 12/05/2015 10:13 AM

## 2015-12-05 NOTE — Plan of Care (Signed)
Problem: Alteration in mood Goal: LTG-Patient's behavior demonstrates decreased manic symptoms (Patient's behavior demonstrates decreased manic symptoms to the point the patient is safe to return home and continue treatment in an outpatient setting)  Outcome: Progressing Patient calm cooperative not as needy as on admission  [place] RN

## 2015-12-06 LAB — BASIC METABOLIC PANEL
Anion gap: 8 (ref 5–15)
BUN: 27 mg/dL — AB (ref 6–20)
CHLORIDE: 103 mmol/L (ref 101–111)
CO2: 26 mmol/L (ref 22–32)
Calcium: 9.4 mg/dL (ref 8.9–10.3)
Creatinine, Ser: 1.39 mg/dL — ABNORMAL HIGH (ref 0.44–1.00)
GFR calc non Af Amer: 39 mL/min — ABNORMAL LOW (ref >60)
GFR, EST AFRICAN AMERICAN: 45 mL/min — AB (ref >60)
Glucose, Bld: 120 mg/dL — ABNORMAL HIGH (ref 65–99)
POTASSIUM: 5.2 mmol/L — AB (ref 3.5–5.1)
SODIUM: 137 mmol/L (ref 135–145)

## 2015-12-06 LAB — GLUCOSE, CAPILLARY
GLUCOSE-CAPILLARY: 119 mg/dL — AB (ref 65–99)
Glucose-Capillary: 109 mg/dL — ABNORMAL HIGH (ref 65–99)

## 2015-12-06 MED ORDER — LOVASTATIN 40 MG PO TABS: 40.0000 mg | ORAL_TABLET | Freq: Every day | ORAL | Status: DC

## 2015-12-06 MED ORDER — METFORMIN HCL 500 MG PO TABS: 500.0000 mg | ORAL_TABLET | Freq: Two times a day (BID) | ORAL | Status: DC

## 2015-12-06 MED ORDER — OLANZAPINE 15 MG PO TABS: 30.0000 mg | ORAL_TABLET | Freq: Every day | ORAL | Status: DC

## 2015-12-06 MED ORDER — DIVALPROEX SODIUM ER 500 MG PO TB24: 1500.0000 mg | ORAL_TABLET | Freq: Every day | ORAL | Status: DC

## 2015-12-06 MED ORDER — POLYETHYLENE GLYCOL 3350 17 G PO PACK: 17.0000 g | PACK | Freq: Every day | ORAL | Status: DC | PRN

## 2015-12-06 MED ORDER — METFORMIN HCL 500 MG PO TABS
500.0000 mg | ORAL_TABLET | Freq: Every day | ORAL | Status: DC
  Administered 2015-12-07 – 2015-12-19 (×14): 500 mg via ORAL
  Filled 2015-12-06 (×14): qty 1

## 2015-12-06 MED ORDER — METOPROLOL TARTRATE 37.5 MG PO TABS: 37.5000 mg | ORAL_TABLET | Freq: Two times a day (BID) | ORAL | Status: DC

## 2015-12-06 MED ORDER — DOCUSATE SODIUM 100 MG PO CAPS: 200.0000 mg | ORAL_CAPSULE | Freq: Two times a day (BID) | ORAL | Status: DC

## 2015-12-06 NOTE — [facility] Group Notes (Signed)
[facility] Group Notes:  (Nursing/MHT/Case Management/Adjunct)  Date:  12/06/2015  Time:  4:08 PM  Type of Therapy:  Group Therapy  Participation Level:  Active  Participation Quality:  Intrusive and Redirectable  Affect:  Excited  Cognitive:  Disorganized  Insight:  Limited  Engagement in Group:  Engaged and Off Topic  Modes of Intervention:  Activity  Summary of Progress/Problems:  Zaide Kardell De'Chelle Brayon [place] 12/06/2015, 4:08 PM

## 2015-12-06 NOTE — BHH Group Notes (Signed)
[facility] LCSW Group Therapy  12/06/2015 11:55 AM  Type of Therapy:  Group Therapy  Participation Level:  Did Not Attend  Summary of Progress/Problems: Patient was called to group but did not attend.   Keene Breath, MSW, LCSW 12/06/2015, 11:55 AM

## 2015-12-06 NOTE — BHH Group Notes (Signed)
[facility] Group Notes:  (Nursing/MHT/Case Management/Adjunct)  Date:  12/06/2015  Time:  4:37 AM  Type of Therapy:  Group Therapy  Participation Level:  Active  Participation Quality:  Appropriate  Affect:  Appropriate  Cognitive:  Appropriate  Insight:  Appropriate  Engagement in Group:  Engaged  Modes of Intervention:  n/a  Summary of Progress/Problems:  Gina [name] 12/06/2015, 4:37 AM 

## 2015-12-06 NOTE — BHH Group Notes (Signed)
[facility] Group Notes:  (Nursing/MHT/Case Management/Adjunct)  Date:  12/06/2015  Time:  11:22 PM  Type of Therapy:  Psychoeducational Skills  Participation Level:  Active  Participation Quality:  Intrusive and Monopolizing  Affect:  Excited  Cognitive:  Appropriate  Insight:  Limited  Engagement in Group:  Distracting, Monopolizing, Off Topic and Poor  Modes of Intervention:  Education, Exploration and Limit-setting  Summary of Progress/Problems:  Gina [name] R Gina [name] 12/06/2015, 11:22 PM

## 2015-12-06 NOTE — Progress Notes (Addendum)
Patient with sad affect, cooperative behavior with meals, meds and plan of care. No SI/HI at this time. Slightly confused rt new admission needs reorientation to unit. Meets with MD and safety maintained. States her daily goal is getting well.

## 2015-12-06 NOTE — BHH Group Notes (Signed)
[facility] LCSW Aftercare Discharge Planning Group Note   12/06/2015 12:18 PM  Participation Quality:  Patient attended and participated in group discussion sharing her SMART goal was to "get well so I don't have to come back and forth to the hospital". Patient wanted to read Psalm 23 to the group but not all group members were ok with this so patient waited till after group to do this with those that were interested. Patient received a daily workbook on Leisure Activities.   Mood/Affect:  Excited  Depression Rating:  patient was asked on a scale of 0-10 but not able to express in a number  Anxiety Rating:  patient was asked on a scale of 0-10 but was not able to express in a number  Thoughts of Suicide:  No Will you contract for safety?   NA  Current AVH:  No  Plan for Discharge/Comments:  Discharge home with sister and follow up outpatient   Transportation Means: patient is not sure but may need assistance with transportation at discharge  Supports: sister  Keene Breath, MSW, LCSW

## 2015-12-06 NOTE — BHH Counselor (Signed)
Adult Comprehensive Assessment  Patient ID: Gina [name], female   DOB: 30-Oct-1950, 65 y.o.   MRN: [id number]  Information Source: Information source: Patient  Current Stressors:  Educational / Learning stressors: None reported  Employment / Job issues: Engineer, agricultural.  Family Relationships: Pt reports her sister is sick  Museum/gallery curator / Lack of resources (include bankruptcy): Limited income  Housing / Lack of housing: Pt lives with sister.  Physical health (include injuries & life threatening diseases): None reported  Social relationships: None reported  Substance abuse: None reported  Bereavement / Loss: None reported   Living/Environment/Situation:  Living Arrangements: Other relatives Living conditions (as described by patient or guardian): Pt lives with sister.  How long has patient lived in current situation?: "all my life."  What is atmosphere in current home: Comfortable  Family History:  Marital status: Single Are you sexually active?: No What is your sexual orientation?: None reported  Has your sexual activity been affected by drugs, alcohol, medication, or emotional stress?: None reported  Does patient have children?: No  Childhood History:  By whom was/is the patient raised?: Both parents Description of patient's relationship with caregiver when they were a child: Good relationship with parents  Patient's description of current relationship with people who raised him/her: Parents passed away. Pt is not sure how long ago.  How were you disciplined when you got in trouble as a child/adolescent?: None reported  Does patient have siblings?: Yes Number of Siblings: 4 Description of patient's current relationship with siblings: 2 sisters; good relationship. 2 brothers, one lives in [place], [place] relationship with other brothers.  Did patient suffer any verbal/emotional/physical/sexual abuse as a child?: No Did patient suffer from severe childhood neglect?: No Has  patient ever been sexually abused/assaulted/raped as an adolescent or adult?: No Was the patient ever a victim of a crime or a disaster?: No Witnessed domestic violence?: No Has patient been effected by domestic violence as an adult?: No  Education:  Highest grade of school patient has completed: GED Currently a Ship broker?: No Learning disability?: No  Employment/Work Situation:   Employment situation: On disability Why is patient on disability: "mood swings"  How long has patient been on disability: Pt is unable to say how long  Patient's job has been impacted by current illness: No What is the longest time patient has a held a job?: "a long time"  Where was the patient employed at that time?: [place]  Has patient ever been in the [employer]?: No  Financial Resources:   Museum/gallery curator resources: Teacher, early years/pre, Medicare Does patient have a Programmer, applications or guardian?: No  Alcohol/Substance Abuse:   What has been your use of drugs/alcohol within the last 12 months?: Denies use.  Alcohol/Substance Abuse Treatment Hx: Denies past history Has alcohol/substance abuse ever caused legal problems?: No  Social Support System:   Patient's Community Support System: Fair Dietitian Support System: family  Type of faith/religion: Christianity  How does patient's faith help to cope with current illness?: Pt proceded to resite the Lords prayer.   Leisure/Recreation:   Leisure and Hobbies: collecting snoopy toys and Research scientist (medical).   Strengths/Needs:   What things does the patient do well?: unable to state.  In what areas does patient struggle / problems for patient: None reported   Discharge Plan:   Does patient have access to transportation?: Yes Will patient be returning to same living situation after discharge?: Yes Currently receiving community mental health services: Yes (From Whom) ([place]. )  Does patient have financial barriers related to discharge  medications?: No  Summary/Recommendations:    Patient is a 65 year old female admitted  with a diagnosis of Bipolar I disorder. Patient presented to the hospital with Mania. Patient reports primary triggers for admission were non compliance of medication. During assessment, pt was disorganized and hyper verbal. She was a limited historian. Prior to admission, pt reports struggling to take medications correctly. She reports she cannot remember if she takes them or not. Pt currently lives with her sister but is not sure if she can return. CSW assessing. Pt receives outpatient services at [facility]. Patient will benefit from crisis stabilization, medication evaluation, group therapy and psycho education in addition to case management for discharge. At discharge, it is recommended that patient remain compliant with established discharge plan and continued treatment.   [place].MSW, LCSWA  12/06/2015

## 2015-12-06 NOTE — Progress Notes (Signed)
[facility] MD Progress Note  12/06/2015 1:18 PM Gina [name]  MRN:  [id number] Subjective:  Patient reports sleeping well last night. Feels that the medications is right on target. Patient denies side effects from medications, denies having suicidality, homicidality or  hallucinations. As far as physical complaints denies having any complaints today. . She denies any issues with appetite, energy or concentration. During assessment the patient was his speaking very loudly, her speech was pressured, her thought process was tangential very hard to follow, her mood is euphoric, she is constantly interrupting others and his speaking over others.  She appears to have psychomotor agitation.  Minimal improvement since admission. Compliant with medications last night.  Per nursing: Patient with sad affect, cooperative behavior with meals, meds and plan of care. No SI/HI at this time. Slightly confused rt new admission needs reorientation to unit. Meets with MD and safety maintained. States her daily goal is getting well.          Principal Problem: Bipolar disorder, manic, moderate ([place]) Diagnosis:   Patient Active Problem List   Diagnosis Date Noted  . Bipolar disorder, manic, moderate ([place]) [F31.12] 12/03/2015  . NASH (nonalcoholic steatohepatitis) [K75.81] 03/28/2015  . Gout [M10.9] 03/27/2015  . HCV antibody positive [R89.4] 03/27/2015  . Memory deficit [R41.3] 09/13/2014  . Uncontrolled type 2 diabetes mellitus with nephropathy ([place]) [E11.21, E11.65] 06/07/2014  . Right knee pain [M25.561] 03/16/2014  . Hyperlipidemia [E78.5]   . IDA (iron deficiency anemia) [D50.9] 01/26/2013  . History of colonic polyps [Z86.010] 11/11/2012  . Recurrent falls [R29.6] 10/02/2012  . Urine incontinence [R32] 08/27/2012  . Recurrent ventral hernia [K43.2] 08/11/2012  . OSA on CPAP [G47.33]   . Intertrigo [L30.4] 05/27/2012  . CKD (chronic kidney disease) stage 3, GFR 30-59 ml/min [N18.3]   . Obesity,  Class II, BMI 35-39.9, with comorbidity ([place]) [E66.01]   . Colon cancer ([place]) [C18.9] 04/07/2012  . Enterocutaneous fistula [K63.2] 04/07/2012  . HTN (hypertension), benign [I10] 04/07/2012   Total Time spent with patient: 30 minutes    Past Medical History:  Past Medical History  Diagnosis Date  . Bipolar depression [name])     sees psychiatrist - psych admission 03/2015  . DJD (degenerative joint disease), lumbar     chronic lower back pain  . Obesity (BMI 30-39.9)   . History of colon cancer     s/p surgery  . 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 OSA on CPAP     6cm H2O  . RBBB   . Anemia in chronic kidney disease   . Blood transfusion without reported diagnosis 2009  . Cataract     left  . GERD (gastroesophageal reflux disease)   . Hyperlipidemia   . Hypertension   . History of bladder cancer 1997  . History of uterine cancer     s/p hysterectomy  . CKD (chronic kidney disease) stage 3, GFR 30-59 ml/min   . Personal history of colonic adenomas and colon cancer 11/11/2012  . IDA (iron deficiency anemia) 01/2013    thought due to h/o polyps  . Positive hepatitis C antibody test 09/2014    but negative confirmatory testing  . Colon cancer ([place]) 1990's  . Family history of breast cancer   . Family history of colon cancer   . Family history of stomach cancer   . Uncontrolled type 2 diabetes mellitus with nephropathy ([place]) 06/07/2014    [facility] DSME 11/2015    Past Surgical  History  Procedure Laterality Date  . Hernia repair  02/04/12  . Partial colectomy  about 2008    for colon cancer  . I & d abdominal wound  02/19/12  . Removal of infected mesh and abdominal wound vac placement  02/24/12  . Reexploration of abdominal wound and allograft placemet  02/26/12  . Partial hysterectomy  1981    uterine cancer, R ovary remains  . Left oophorectomy  2005  . Colonoscopy  07/2009  . Dexa  12/2009    WNL  . Dobutamine stress echo  12/2009    no  evidence of ischemia  . Colonoscopy  11/2012    2 tubular adenomas, mild diverticulosis, pending genetic testing for Lynch syndrome [name]) rpt 2 yrs  . Sleep study  02/2014    OSA - AHI 55, nadir 81% [name])  . Breast biopsy Right 01/2014    fibroadenoma  . Colonoscopy  02/2015    TA, diverticulosis, rpt 2 yrs [name])   Family History:  Family History  Problem Relation Age of Onset  . Colon cancer Mother 52  . Stomach cancer Mother     dx in her 27s?  Marland Kitchen Colon cancer Sister 23    Maternal half sister  . CAD Father     MI  . Diabetes Other     aunts and uncles both sides  . Arthritis Other     strong fmhx  . Mental illness Sister     anxiety/depression  . Breast cancer Maternal Grandmother   . Esophageal cancer Neg Hx   . Rectal cancer Neg Hx    Family Psychiatric  History:   Social History:  History  Alcohol Use No     History  Drug Use No    Social History   Social History  . Marital Status: Single    Spouse Name: N/A  . Number of Children: 0  . Years of Education: N/A   Social History Main Topics  . Smoking status: Never Smoker   . Smokeless tobacco: Never Used  . Alcohol Use: No  . Drug Use: No  . Sexual Activity: No   Other Topics Concern  . None   Social History Narrative   Lives with sister, no pets   Occupation: disabled, for bipolar and arthritis   Edu: GED   Activity: take walks   Diet: good water, vegetables daily   Religion: [place], Dr. Jimmye Norman (ph [phone])    Sleep: Good  Appetite:  Good  Current Medications: Current Facility-Administered Medications  Medication Dose Route Frequency Provider Last Rate Last Dose  . acetaminophen (TYLENOL) tablet 650 mg  650 mg Oral Q6H PRN [name], MD   650 mg at 12/05/15 1253  . alum & mag hydroxide-simeth (MAALOX/MYLANTA) 200-200-20 MG/5ML suspension 30 mL  30 mL Oral Q4H PRN [name], MD   30 mL at 12/05/15 1940  . divalproex (DEPAKOTE  ER) 24 hr tablet 1,500 mg  1,500 mg Oral QHS [name], MD   1,500 mg at 12/05/15 2140  . docusate sodium (COLACE) capsule 200 mg  200 mg Oral BID [name], MD   200 mg at 12/06/15 0820  . magnesium hydroxide (MILK OF MAGNESIA) suspension 30 mL  30 mL Oral Daily PRN [name], MD   30 mL at 12/05/15 2030  . [START ON 12/07/2015] metFORMIN (GLUCOPHAGE) tablet 500 mg  500 mg Oral Q  breakfast [name], MD      . metoprolol tartrate (LOPRESSOR) tablet 37.5 mg  37.5 mg Oral BID [name], MD   37.5 mg at 12/06/15 0820  . OLANZapine (ZYPREXA) tablet 30 mg  30 mg Oral QHS [name], MD   30 mg at 12/05/15 2142  . polyethylene glycol (MIRALAX / GLYCOLAX) packet 17 g  17 g Oral Daily PRN [name], MD      . pravastatin (PRAVACHOL) tablet 20 mg  20 mg Oral q1800 [name], MD   20 mg at 12/05/15 1656    Lab Results:  Results for orders placed or performed during the hospital encounter of 12/03/15 (from the past 48 hour(s))  Glucose, capillary     Status: Abnormal   Collection Time: 12/05/15  7:48 AM  Result Value Ref Range   Glucose-Capillary 102 (H) 65 - 99 mg/dL  Glucose, capillary     Status: Abnormal   Collection Time: 12/06/15  6:54 AM  Result Value Ref Range   Glucose-Capillary 109 (H) 65 - 99 mg/dL  Basic metabolic panel     Status: Abnormal   Collection Time: 12/06/15 11:06 AM  Result Value Ref Range   Sodium 137 135 - 145 mmol/L   Potassium 5.2 (H) 3.5 - 5.1 mmol/L   Chloride 103 101 - 111 mmol/L   CO2 26 22 - 32 mmol/L   Glucose, Bld 120 (H) 65 - 99 mg/dL   BUN 27 (H) 6 - 20 mg/dL   Creatinine, Ser 1.39 (H) 0.44 - 1.00 mg/dL   Calcium 9.4 8.9 - 10.3 mg/dL   GFR calc non Af Amer 39 (L) >60 mL/min   GFR calc Af Amer 45 (L) >60 mL/min    Comment: (NOTE) The eGFR has been calculated using the CKD EPI equation. This calculation has not been validated in all clinical situations. eGFR's  persistently <60 mL/min signify possible Chronic Kidney Disease.    Anion gap 8 5 - 15    Blood Alcohol level:  Lab Results  Component Value Date   ETH <5 12/03/2015   ETH <5 11/20/2015    Physical Findings: AIMS: Facial and Oral Movements Muscles of Facial Expression: None, normal Lips and Perioral Area: None, normal Jaw: None, normal Tongue: None, normal,Extremity Movements Upper (arms, wrists, hands, fingers): None, normal Lower (legs, knees, ankles, toes): None, normal, Trunk Movements Neck, shoulders, hips: None, normal, Overall Severity Severity of abnormal movements (highest score from questions above): None, normal Incapacitation due to abnormal movements: None, normal Patient's awareness of abnormal movements (rate only patient's report): No Awareness, Dental Status Current problems with teeth and/or dentures?: No Does patient usually wear dentures?: No  CIWA:    COWS:     Musculoskeletal: Strength & Muscle Tone: within normal limits Gait & Station: normal Patient leans: N/A  Psychiatric Specialty Exam: Review of Systems  Constitutional: Negative.   HENT: Negative.   Eyes: Negative.   Respiratory: Negative.   Cardiovascular: Negative.   Gastrointestinal: Negative.   Genitourinary: Negative.   Musculoskeletal: Negative.   Skin: Negative.   Neurological: Negative.   Endo/Heme/Allergies: Negative.   Psychiatric/Behavioral: Negative for depression, hallucinations, memory loss and substance abuse. The patient is not nervous/anxious and does not have insomnia.     Blood pressure 131/84, pulse 72, temperature 98.2 F (36.8 C), temperature source Oral, resp. rate 16, height 5' 6"  (1.676 m), weight 86.637 kg (191 lb), SpO2 97 %.Body mass index is 30.84 kg/(m^2).  General Appearance: Fairly  Groomed  Engineer, water::  Good  Speech:  Pressured  Volume:  Increased  Mood:  Euphoric  Affect:  Labile  Thought Process:  Tangential  Orientation:  Full (Time, Place, and  Person)  Thought Content:  Hallucinations: None  Suicidal Thoughts:  No  Homicidal Thoughts:  No  Memory:  Immediate;   Fair Recent;   Fair Remote;   Fair  Judgement:  Impaired  Insight:  Shallow  Psychomotor Activity:  Increased  Concentration:  Poor  Recall:  Poor  Fund of Knowledge:Fair  Language: Good  Akathisia:  No  Handed:    AIMS (if indicated):     Assets:  Communication Skills Social Support  ADL's:  Intact  Cognition: WNL  Sleep:  Number of Hours: 6.3   Treatment Plan Summary:  For bipolar disorder: continue olanzapine 30 mg po q day.  Continue depakote ER to 1500 mg qhs.  Will check depakote level, LFTs and ammonia on Monday night  There are many risks associated with treating her with Depakote as she suffers from a steatosis, and hepatitis C. Depakote level and LFTs will have to be followed up closely. She is not a candidate for other mood stabilizers such as lithium as she suffers from a stage III kidney failure and she is not a candidate for Tegretol as she on a multitude of other medications that can have interactions with Tegretol and Tegretol has  same effects on liver as Depakote.  For insomnia: will d/c klonopin for now in order to minimize med regimen  Diabetes: will decrease metformin to 500 mg as Cr Cl is low 39.  Will check CBG twice per day.  Hypertension the patient will be continued on metoprolol 37.5 mg by mouth daily  Dyslipidemia the patient will be continued on Pravachol 20 mg a day  Constipation the patient will be continued on Colace 200 mg by mouth twice a day.  I will change miralax to every other day as he is reporting diarrhea yesterday.  OSA: will order CAP prn as she does not want to use it all the time.   Hemorrhoids Order preparation H for the patient to use twice a day for 2 days.--completed  Falls: Patient does not appear to having issues with her gait however per history she has recurrence falls at home. I will place the patient  on fall precautions. No issues with gait here  Diet heart healthy and carb modified  Precautions fall and every 15 minute checks  Hospitalization and status continue voluntary admission  Disposition she will return to her sister's house once stable  Discharge follow-up she'll continue to follow-up with Dr. Gretel Acre in our clinic.  Will check depakote level along with ammonia, and LFTs on Monday night  Labs HbA1c 6.9, TSH wnl , lipid panel (low HDL) and prolactin mildly elevated  Contacted [facility]: they can deliver her medications for $5 and could prepare her meds in bubble packs for $1.   [name], MD 12/06/2015, 1:18 PM

## 2015-12-06 NOTE — Plan of Care (Signed)
Problem: Consults Goal: [facility] General Treatment Patient Education Outcome: Progressing Actively participating in plan of care.

## 2015-12-06 NOTE — BHH Group Notes (Signed)
[place] LCSW Group Therapy  12/06/2015 2:06 PM  Type of Therapy:  Group Therapy  Participation Level:  Active  Participation Quality:  Attentive, Intrusive and Redirectable  Affect:  Excited  Cognitive:  Disorganized  Insight:  Limited  Engagement in Therapy:  Engaged and Monopolizing  Modes of Intervention:  Discussion, Socialization and Support  Summary of Progress/Problems: Patient attended and participated in group discussion introducing herself and sharing during an introductory exercise 3 things she would take with her if she were stranded on a deserted [place] would be "clothes, food but not apples, and a hat to protect from the sun". Patient was disorganized and spoke mostly about faith and was labile with little regard to other group member's beliefs but was redirectable. Patient shares a relapse for her is her medications not working and wants to get back to balance with them.  Keene Breath, MSW, LCSW 12/06/2015, 2:06 PM

## 2015-12-06 NOTE — Progress Notes (Signed)
Recreation Therapy Notes  Date: 05.04.17 Time: 9:30 am Location: Craft Room  Group Topic: Leisure Education  Goal Area(s) Addresses:  Patient will identify things they are grateful for.  Behavioral Response: Attentive, Interactive, Left early  Intervention: Grateful Wheel  Activity: Patients were given an "I Am Grateful For" worksheet and instructed to write things they are grateful for under each category.  Education: LRT educated patients on why it is important to be grateful.  Education Outcome: Patient left before LRT educated group.  Clinical Observations/Feedback: Patient participated in activity by saying things she was grateful for. Patient left group at approximately 9:59 am with Dr. Jerilee Hoh. Patient did not return to group.  Leonette Monarch, LRT/CTRS 12/06/2015 10:21 AM

## 2015-12-06 NOTE — Tx Team (Signed)
Interdisciplinary Treatment Plan Update (Adult)  Date:  12/06/2015 Time Reviewed:  10:35 AM  Progress in Treatment: Attending groups: Yes. Participating in groups:  Yes. Taking medication as prescribed:  Yes. Tolerating medication:  Yes. Family/Significant othe contact made:  No, will contact:  sister Patient understands diagnosis:  No. and As evidenced by:  limited insight  Discussing patient identified problems/goals with staff:  Yes. Medical problems stabilized or resolved:  Yes. Denies suicidal/homicidal ideation: Yes. Issues/concerns per patient self-inventory:  Yes. Other:  New problem(s) identified: No, Describe:  NA  Discharge Plan or Barriers: Pt plans to return home and follow up with outpatient.    Reason for Continuation of Hospitalization: Mania Medication stabilization  Comments:Patient reports insomnia last night. He says that the nurses were her abdomen when they offer her to CPAP. She only was to use his CPAP when needed. Patient denies side effects from medications, denies having suicidality, homicidality or auditory or visual hallucinations. As far as physical complaints says that yesterday she had diarrhea but today she feels constipated. She denies any issues with appetite, energy or concentration. During assessment the patient was his speaking very loudly, her speech was pressured, her thought process was tangential very hard to follow, her mood is euphoric, she is constantly interrupting others and his speaking over others. She appears to have psychomotor agitation. No improvement since admission. Compliant with medications last night.  Estimated length of stay: 7 days   New goal(s): NA  Review of initial/current patient goals per problem list:   1.  Goal(s): Patient will participate in aftercare plan * Met:  * Target date: at discharge * As evidenced by: Patient will participate within aftercare plan AEB aftercare provider and housing plan at discharge  being identified.  2.  Goal (s): Patient will demonstrate decreased symptoms of mania. * Met: No  *  Target date: at discharge * As evidenced by: Patient will not endorse signs of mania or be deemed stable for discharge by MD.   Attendees: Patient:  Gina [name] 5/4/201710:35 AM  Family:   5/4/201710:35 AM  Physician:  Dr. Jerilee Hoh   5/4/201710:35 AM  Nursing:   Floyde Parkins, RN  5/4/201710:35 AM  Case Manager:   5/4/201710:35 AM  Counselor:   5/4/201710:35 AM  Other:  Wray Kearns, LCSWA 5/4/201710:35 AM  Other:  Everitt Amber, LRT  5/4/201710:35 AM  Other:   5/4/201710:35 AM  Other:  5/4/201710:35 AM  Other:  5/4/201710:35 AM  Other:  5/4/201710:35 AM  Other:  5/4/201710:35 AM  Other:  5/4/201710:35 AM  Other:  5/4/201710:35 AM  Other:   5/4/201710:35 AM   Scribe for Treatment Team:   Wray Kearns MSW, [place] , 12/06/2015, 10:35 AM

## 2015-12-06 NOTE — Progress Notes (Signed)
Patient refuses cpap tonight [place]

## 2015-12-07 LAB — GLUCOSE, CAPILLARY
Glucose-Capillary: 89 mg/dL (ref 65–99)
Glucose-Capillary: 62 mg/dL — ABNORMAL LOW (ref 65–99)

## 2015-12-07 MED ORDER — ATENOLOL 25 MG PO TABS
12.5000 mg | ORAL_TABLET | Freq: Every day | ORAL | Status: DC
  Administered 2015-12-08 – 2015-12-09 (×2): 12.5 mg via ORAL
  Filled 2015-12-07 (×2): qty 1

## 2015-12-07 MED ORDER — POLYETHYLENE GLYCOL 3350 17 G PO PACK
17.0000 g | PACK | ORAL | Status: DC
  Administered 2015-12-07 – 2015-12-11 (×3): 17 g via ORAL
  Filled 2015-12-07 (×3): qty 1

## 2015-12-07 NOTE — Progress Notes (Signed)
[facility] MD Progress Note  12/07/2015 1:44 PM Gina [name]  MRN:  [id number] Subjective:  Patient reports sleeping poorly last night (she has been refusing CPAP at times). Feels that the medications is right on target. Patient denies side effects from medications, denies having suicidality, homicidality or  hallucinations. As far as physical complaints denies having any complaints today.  She denies any issues with appetite, energy or concentration.   During assessment the patient was his speaking very loudly, her speech was pressured, her thought process was tangential very hard to follow, her mood is euphoric, she is constantly interrupting others and his speaking over others.  She appears to have psychomotor agitation.  Minimal improvement since admission.  No aggression or agitation.  Attending groups.   Per nursing: slept 4 h last night.  Compliant with medications.  Principal Problem: Bipolar disorder, manic, moderate ([place]) Diagnosis:   Patient Active Problem List   Diagnosis Date Noted  . Bipolar disorder, manic, moderate ([place]) [F31.12] 12/03/2015  . NASH (nonalcoholic steatohepatitis) [K75.81] 03/28/2015  . Gout [M10.9] 03/27/2015  . HCV antibody positive [R89.4] 03/27/2015  . Memory deficit [R41.3] 09/13/2014  . Uncontrolled type 2 diabetes mellitus with nephropathy ([place]) [E11.21, E11.65] 06/07/2014  . Right knee pain [M25.561] 03/16/2014  . Hyperlipidemia [E78.5]   . IDA (iron deficiency anemia) [D50.9] 01/26/2013  . History of colonic polyps [Z86.010] 11/11/2012  . Recurrent falls [R29.6] 10/02/2012  . Urine incontinence [R32] 08/27/2012  . Recurrent ventral hernia [K43.2] 08/11/2012  . OSA on CPAP [G47.33]   . Intertrigo [L30.4] 05/27/2012  . CKD (chronic kidney disease) stage 3, GFR 30-59 ml/min [N18.3]   . Obesity, Class II, BMI 35-39.9, with comorbidity ([place]) [E66.01]   . Colon cancer ([place]) [C18.9] 04/07/2012  . Enterocutaneous fistula [K63.2] 04/07/2012  . HTN  (hypertension), benign [I10] 04/07/2012   Total Time spent with patient: 30 minutes    Past Medical History:  Past Medical History  Diagnosis Date  . Bipolar depression [facility])     sees psychiatrist - psych admission 03/2015  . DJD (degenerative joint disease), lumbar     chronic lower back pain  . Obesity (BMI 30-39.9)   . History of colon cancer     s/p surgery  . 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 OSA on CPAP     6cm H2O  . RBBB   . Anemia in chronic kidney disease   . Blood transfusion without reported diagnosis 2009  . Cataract     left  . GERD (gastroesophageal reflux disease)   . Hyperlipidemia   . Hypertension   . History of bladder cancer 1997  . History of uterine cancer     s/p hysterectomy  . CKD (chronic kidney disease) stage 3, GFR 30-59 ml/min   . Personal history of colonic adenomas and colon cancer 11/11/2012  . IDA (iron deficiency anemia) 01/2013    thought due to h/o polyps  . Positive hepatitis C antibody test 09/2014    but negative confirmatory testing  . Colon cancer ([place]) 1990's  . Family history of breast cancer   . Family history of colon cancer   . Family history of stomach cancer   . Uncontrolled type 2 diabetes mellitus with nephropathy ([place]) 06/07/2014    [facility] DSME 11/2015    Past Surgical History  Procedure Laterality Date  . Hernia repair  02/04/12  . Partial colectomy  about 2008    for colon cancer  .  I & d abdominal wound  02/19/12  . Removal of infected mesh and abdominal wound vac placement  02/24/12  . Reexploration of abdominal wound and allograft placemet  02/26/12  . Partial hysterectomy  1981    uterine cancer, R ovary remains  . Left oophorectomy  2005  . Colonoscopy  07/2009  . Dexa  12/2009    WNL  . Dobutamine stress echo  12/2009    no evidence of ischemia  . Colonoscopy  11/2012    2 tubular adenomas, mild diverticulosis, pending genetic testing for Lynch syndrome [name]) rpt 2 yrs  .  Sleep study  02/2014    OSA - AHI 55, nadir 81% [name])  . Breast biopsy Right 01/2014    fibroadenoma  . Colonoscopy  02/2015    TA, diverticulosis, rpt 2 yrs [name])   Family History:  Family History  Problem Relation Age of Onset  . Colon cancer Mother 82  . Stomach cancer Mother     dx in her 60s?  Marland Kitchen Colon cancer Sister 71    Maternal half sister  . CAD Father     MI  . Diabetes Other     aunts and uncles both sides  . Arthritis Other     strong fmhx  . Mental illness Sister     anxiety/depression  . Breast cancer Maternal Grandmother   . Esophageal cancer Neg Hx   . Rectal cancer Neg Hx    Family Psychiatric  History:   Social History:  History  Alcohol Use No     History  Drug Use No    Social History   Social History  . Marital Status: Single    Spouse Name: N/A  . Number of Children: 0  . Years of Education: N/A   Social History Main Topics  . Smoking status: Never Smoker   . Smokeless tobacco: Never Used  . Alcohol Use: No  . Drug Use: No  . Sexual Activity: No   Other Topics Concern  . None   Social History Narrative   Lives with sister, no pets   Occupation: disabled, for bipolar and arthritis   Edu: GED   Activity: take walks   Diet: good water, vegetables daily   Religion: [place], Dr. Jimmye Norman (ph [phone])    Sleep: Good  Appetite:  Good  Current Medications: Current Facility-Administered Medications  Medication Dose Route Frequency Provider Last Rate Last Dose  . acetaminophen (TYLENOL) tablet 650 mg  650 mg Oral Q6H PRN [name], MD   650 mg at 12/05/15 1253  . alum & mag hydroxide-simeth (MAALOX/MYLANTA) 200-200-20 MG/5ML suspension 30 mL  30 mL Oral Q4H PRN [name], MD   30 mL at 12/05/15 1940  . [START ON 12/08/2015] atenolol (TENORMIN) tablet 12.5 mg  12.5 mg Oral Daily [name], MD      . divalproex (DEPAKOTE ER) 24 hr tablet 1,500 mg  1,500 mg Oral  QHS [name], MD   1,500 mg at 12/06/15 2200  . docusate sodium (COLACE) capsule 200 mg  200 mg Oral BID [name], MD   200 mg at 12/07/15 1105  . magnesium hydroxide (MILK OF MAGNESIA) suspension 30 mL  30 mL Oral Daily PRN [name], MD   30 mL at 12/05/15 2030  . metFORMIN (GLUCOPHAGE) tablet 500 mg  500 mg Oral Q breakfast [name], MD   500  mg at 12/07/15 1106  . OLANZapine (ZYPREXA) tablet 30 mg  30 mg Oral QHS [name], MD   30 mg at 12/06/15 2200  . polyethylene glycol (MIRALAX / GLYCOLAX) packet 17 g  17 g Oral QODAY [name], MD      . pravastatin (PRAVACHOL) tablet 20 mg  20 mg Oral q1800 [name], MD   20 mg at 12/06/15 1652    Lab Results:  Results for orders placed or performed during the hospital encounter of 12/03/15 (from the past 48 hour(s))  Glucose, capillary     Status: Abnormal   Collection Time: 12/06/15  6:54 AM  Result Value Ref Range   Glucose-Capillary 109 (H) 65 - 99 mg/dL  Basic metabolic panel     Status: Abnormal   Collection Time: 12/06/15 11:06 AM  Result Value Ref Range   Sodium 137 135 - 145 mmol/L   Potassium 5.2 (H) 3.5 - 5.1 mmol/L   Chloride 103 101 - 111 mmol/L   CO2 26 22 - 32 mmol/L   Glucose, Bld 120 (H) 65 - 99 mg/dL   BUN 27 (H) 6 - 20 mg/dL   Creatinine, Ser 1.39 (H) 0.44 - 1.00 mg/dL   Calcium 9.4 8.9 - 10.3 mg/dL   GFR calc non Af Amer 39 (L) >60 mL/min   GFR calc Af Amer 45 (L) >60 mL/min    Comment: (NOTE) The eGFR has been calculated using the CKD EPI equation. This calculation has not been validated in all clinical situations. eGFR's persistently <60 mL/min signify possible Chronic Kidney Disease.    Anion gap 8 5 - 15  Glucose, capillary     Status: Abnormal   Collection Time: 12/06/15  4:56 PM  Result Value Ref Range   Glucose-Capillary 119 (H) 65 - 99 mg/dL  Glucose, capillary     Status: None   Collection Time: 12/07/15  6:57  AM  Result Value Ref Range   Glucose-Capillary 89 65 - 99 mg/dL    Blood Alcohol level:  Lab Results  Component Value Date   ETH <5 12/03/2015   ETH <5 11/20/2015    Physical Findings: AIMS: Facial and Oral Movements Muscles of Facial Expression: None, normal Lips and Perioral Area: None, normal Jaw: None, normal Tongue: None, normal,Extremity Movements Upper (arms, wrists, hands, fingers): None, normal Lower (legs, knees, ankles, toes): None, normal, Trunk Movements Neck, shoulders, hips: None, normal, Overall Severity Severity of abnormal movements (highest score from questions above): None, normal Incapacitation due to abnormal movements: None, normal Patient's awareness of abnormal movements (rate only patient's report): No Awareness, Dental Status Current problems with teeth and/or dentures?: No Does patient usually wear dentures?: No  CIWA:    COWS:     Musculoskeletal: Strength & Muscle Tone: within normal limits Gait & Station: normal Patient leans: N/A  Psychiatric Specialty Exam: Review of Systems  Constitutional: Negative.   HENT: Negative.   Eyes: Negative.   Respiratory: Negative.   Cardiovascular: Negative.   Gastrointestinal: Negative.   Genitourinary: Negative.   Musculoskeletal: Negative.   Skin: Negative.   Neurological: Negative.   Endo/Heme/Allergies: Negative.   Psychiatric/Behavioral: Negative for depression, hallucinations, memory loss and substance abuse. The patient has insomnia. The patient is not nervous/anxious.     Blood pressure 138/48, pulse 65, temperature 98.3 F (36.8 C), temperature source Oral, resp. rate 20, height 5' 6"  (1.676 m), weight 86.637 kg (191 lb), SpO2 97 %.Body mass index is 30.84 kg/(m^2).  General Appearance: Fairly  Groomed  Engineer, water::  Good  Speech:  Pressured  Volume:  Increased  Mood:  Euphoric  Affect:  Labile  Thought Process:  Tangential  Orientation:  Full (Time, Place, and Person)  Thought  Content:  Hallucinations: None  Suicidal Thoughts:  No  Homicidal Thoughts:  No  Memory:  Immediate;   Fair Recent;   Fair Remote;   Fair  Judgement:  Impaired  Insight:  Shallow  Psychomotor Activity:  Increased  Concentration:  Poor  Recall:  Poor  Fund of Knowledge:Fair  Language: Good  Akathisia:  No  Handed:    AIMS (if indicated):     Assets:  Communication Skills Social Support  ADL's:  Intact  Cognition: WNL  Sleep:  Number of Hours: 4   Treatment Plan Summary:  For bipolar disorder: continue olanzapine 30 mg po q day.  Continue depakote ER to 1500 mg qhs.  Will check depakote level, LFTs and ammonia on Monday night.  Will consider d/c pt on Depakote DR as is less expensive for her.  There are many risks associated with treating her with Depakote as she suffers from a steatosis, and hepatitis C. Depakote level and LFTs will have to be followed up closely. She is not a candidate for other mood stabilizers such as lithium as she suffers from a stage III kidney failure and she is not a candidate for Tegretol as she on a multitude of other medications that can have interactions with Tegretol and Tegretol has  same effects on liver as Depakote.  For insomnia: klonopin has been d/ced  in order to minimize med regimen  Diabetes: will decrease metformin to 500 mg as Cr Cl is low 39.  CBG bid has been wnl.  Hypertension: in order to simplify medication regimen I will d/cmetoprolol 37.5 mg bid and start her on atenolol 12.5 mg q day.  If BP elevated this weekend please increase dose to 25 mg q day.  Dyslipidemia the patient will be continued on Pravachol 20 mg a day  Constipation the patient will be continued on Colace 200 mg by mouth twice a day.  I will change miralax to every other day  OSA: pt not sleeping well w/o CPAP. Will order CPAP qhs  Falls: Patient does not appear to having issues with her gait however per history she has recurrence falls at home. I will place the  patient on fall precautions. No issues with gait here  Diet heart healthy and carb modified  Precautions fall and every 15 minute checks  Hospitalization and status continue voluntary admission  Disposition she will return to her sister's house once stable  Discharge follow-up she'll continue to follow-up with Dr. Gretel Acre in our clinic.  Will check depakote level along with ammonia, and LFTs on Monday night  Labs HbA1c 6.9, TSH wnl , lipid panel (low HDL) and prolactin mildly elevated  Contacted [facility]: they can deliver her medications for $5 and could prepare her meds in bubble packs for $1.  Pt worries about her ability to afford medications.    [name], MD 12/07/2015, 1:44 PM

## 2015-12-07 NOTE — B[facility] Group Notes (Signed)
[facility] Group Notes:  (Nursing/MHT/Case Management/Adjunct)  Date:  12/07/2015  Time:  4:10 PM  Type of Therapy:  Psychoeducational Skills  Participation Level:  Active  Participation Quality:  Attentive, Redirectable, Sharing and Supportive  Affect:  Appropriate  Cognitive:  Appropriate  Insight:  Appropriate  Engagement in Group:  Engaged and Off Topic  Modes of Intervention:  Discussion, Education and Support  Summary of Progress/Problems:  Reece Agar 12/07/2015, 4:10 PM

## 2015-12-07 NOTE — Plan of Care (Signed)
Problem: Alteration in mood Goal: LTG-Patient's behavior demonstrates decreased manic symptoms (Patient's behavior demonstrates decreased manic symptoms to the point the patient is safe to return home and continue treatment in an outpatient setting)  Outcome: Progressing Patient demonstrate less mania.

## 2015-12-07 NOTE — Plan of Care (Signed)
Problem: Alteration in mood Goal: LTG-Patient's behavior demonstrates decreased manic symptoms (Patient's behavior demonstrates decreased manic symptoms to the point the patient is safe to return home and continue treatment in an outpatient setting)  Outcome: Not Progressing Patient is hyper-verbal with disorganized thought processes.  Speech is illogical and patient is hyper-religious.

## 2015-12-07 NOTE — BHH Group Notes (Signed)
[facility] LCSW Group Therapy  12/07/2015 2:08 PM  Type of Therapy:  Group Therapy  Participation Level:  Active  Participation Quality:  Attentive, Monopolizing and Redirectable  Affect:  Excited and manic  Cognitive:  Disorganized  Insight:  Improving  Engagement in Therapy:  Improving  Modes of Intervention:  Discussion, Socialization and Support  Summary of Progress/Problems: Patient attended and participated in group discussion but was hyper-verbal, monopolizing but redirectable. Patient introduced herself and shared during an introductory exercise that her dream vacation would be "to [place] and [place] to see the [place]". Patient is pleasantly manic and speaks of her faith frequently but is aware of her need for medications. Patient shared that she likes the hospital and likes being around the people here which she does not get to do at home.    Keene Breath, MSW, LCSW 12/07/2015, 2:08 PM

## 2015-12-07 NOTE — Progress Notes (Signed)
Recreation Therapy Notes  Date: 05.05.17 Time: 9:30 am Location: Craft Room  Group Topic: Coping Skills  Goal Area(s) Addresses:  Patient will participate in healthy coping skill.  Behavioral Response: Attentive, Interactive  Intervention: Coloring  Activity: Patients were instructed to color coloring sheets and think about what emotions they were experiencing as well as what they were thinking about while they were coloring.  Education: LRT educated patients on healthy coping skills.  Education Outcome: In group clarification offered  Clinical Observations/Feedback: Patient colored coloring sheet. Patient contributed to group discussion by stating what emotion she felt and what her mind was focused on while she was coloring.  Leonette Monarch, LRT/CTRS 12/07/2015 10:23 AM

## 2015-12-07 NOTE — Tx Team (Signed)
Interdisciplinary Treatment Plan Update (Adult)  Date:  12/07/2015 Time Reviewed:  3:39 PM  Progress in Treatment: Attending groups: Yes. Participating in groups:  Yes. Taking medication as prescribed:  Yes. Tolerating medication:  Yes. Family/Significant othe contact made:  No, will contact:  sister Patient understands diagnosis:  Yes. Discussing patient identified problems/goals with staff:  Yes. Medical problems stabilized or resolved:  Yes. Denies suicidal/homicidal ideation: Yes. Issues/concerns per patient self-inventory:  Yes. Other:  New problem(s) identified: No, Describe:  NA  Discharge Plan or Barriers: Pt plans to return home and follow up with outpatient.    Reason for Continuation of Hospitalization: Mania Medication stabilization  Comments:Patient reports sleeping poorly last night (she has been refusing CPAP at times). Feels that the medications is right on target. Patient denies side effects from medications, denies having suicidality, homicidality or hallucinations. As far as physical complaints denies having any complaints today. She denies any issues with appetite, energy or concentration. During assessment the patient was his speaking very loudly, her speech was pressured, her thought process was tangential very hard to follow, her mood is euphoric, she is constantly interrupting others and his speaking over others. She appears to have psychomotor agitation. Minimal improvement since admission. No aggression or agitation. Attending groups. Per nursing: slept 4 h last night. Compliant with medications.  Estimated length of stay: 5-7 days   New goal(s): NA  Review of initial/current patient goals per problem list:   1.  Goal(s): Patient will participate in aftercare plan * Met:  * Target date: at discharge * As evidenced by: Patient will participate within aftercare plan AEB aftercare provider and housing plan at discharge being identified.   2.  Goal  (s): Patient will demonstrate decreased symptoms of mania. * Met: No  *  Target date: at discharge * As evidenced by: Patient will not endorse signs of mania or be deemed stable for discharge by MD.   Attendees: Patient:  Gina [name] 5/5/20173:39 PM  Family:   5/5/20173:39 PM  Physician:  Dr. Jerilee Hoh   5/5/20173:39 PM  Nursing:   Elige Radon, RN  5/5/20173:39 PM  Case Manager:   5/5/20173:39 PM  Counselor:   5/5/20173:39 PM  Other:  Wray Kearns, LCSWA 5/5/20173:39 PM  Other: Everitt Amber, LRT   5/5/20173:39 PM  Other:   5/5/20173:39 PM  Other:  5/5/20173:39 PM  Other:  5/5/20173:39 PM  Other:  5/5/20173:39 PM  Other:  5/5/20173:39 PM  Other:  5/5/20173:39 PM  Other:  5/5/20173:39 PM  Other:   5/5/20173:39 PM   Scribe for Treatment Team:   Wray Kearns MSW, [place] , 12/07/2015, 3:39 PM

## 2015-12-07 NOTE — Progress Notes (Signed)
D: patient is hyper-verbal, loud with pressured speech.  Thoughts processes disorganized, difficult to follow conversation.  Patient can be intrusive although she is easily redirectable.  A: Scheduled medications given.  Encouraged group attendance.  Support and encouragement offered.  R: Receptive to treatment plan.  Medication and group compliant. Safety maintained.

## 2015-12-08 LAB — GLUCOSE, CAPILLARY
GLUCOSE-CAPILLARY: 89 mg/dL (ref 65–99)
GLUCOSE-CAPILLARY: 163 mg/dL — AB (ref 65–99)
GLUCOSE-CAPILLARY: 124 mg/dL — AB (ref 65–99)
Glucose-Capillary: 133 mg/dL — ABNORMAL HIGH (ref 65–99)
Glucose-Capillary: 67 mg/dL (ref 65–99)

## 2015-12-08 MED ORDER — CHLORPROMAZINE HCL 25 MG PO TABS
25.0000 mg | ORAL_TABLET | Freq: Every day | ORAL | Status: DC
  Administered 2015-12-08: 25 mg via ORAL
  Filled 2015-12-08 (×2): qty 1

## 2015-12-08 NOTE — Progress Notes (Signed)
D:  Per pt self inventory pt reports sleeping poor, appetite fair, energy level low, ability to pay attention poor, rates depression at a 5 out of 10, hopelessness at a 5 out of 10, anxiety at a 7 out of 10, denies SI/HI/AVH, goal today: "sleep, pray", hyperverbal, needs frequent redirection.     A:  Emotional support provided, Encouraged pt to continue with treatment plan and attend all group activities, q15 min checks maintained for safety.  R:  Pt is receptive, going to groups, pleasant and cooperative with staff and other patients on the unit.

## 2015-12-08 NOTE — Progress Notes (Signed)
D: Observed pt in day room interacting. Patient alert and oriented x4. Patient denies SI/HI/AVH. Pt affect is anxious. Pt was talkative with Probation officer, but no religious preoccupation was noted this shift. Pt endorsed going to group and stated she has learned "to speak my mind and let the past go." Pt rated depression 0/10 and anxiety 0/10. Pt stated her mood was "not too bad." Pt had no complaints A: Offered active listening and support. Provided therapeutic communication. Administered scheduled medications. Encouraged pt to use CPAP. R: Pt pleasant and cooperative. Pt agreed to use CPAP this evening. Pt medication compliant. Will continue Q15 min. checks. Safety maintained.

## 2015-12-08 NOTE — Progress Notes (Signed)
Hypoglycemic Event  CBG: 67  Treatment: 15 GM carbohydrate snack  Symptoms: None  Follow-up CBG: Time:1818p CBG Result:164  Possible Reasons for Event: Unknown  Comments/MD notified: Dr. Georgina Peer

## 2015-12-08 NOTE — Progress Notes (Signed)
[facility] MD Progress Note  12/08/2015 5:51 PM Gina [name]  MRN:  [id number]  Subjective:  Gina [name] is in a very good mood today. She is excited about the prospect of going home on Monday. She denies symptoms of depression and anxiety or psychosis. There are no suicidal or homicidal her only complaint today is poor sleep. In spite of getting CPAP machine last night she was unable to sleep and has been asking to prescribe Thorazine at bedtime. This was discontinued by Dr. Jerilee Hoh. I will prescribe 20 50 thioridazine tonight to see if it improves her sleep. The patient reports that out of frustration she threw away her glucometer through the window. She will need new diabetic supplies upon discharge.  Principal Problem: Bipolar disorder, manic, moderate ([place]) Diagnosis:   Patient Active Problem List   Diagnosis Date Noted  . Bipolar disorder, manic, moderate ([place]) [F31.12] 12/03/2015  . NASH (nonalcoholic steatohepatitis) [K75.81] 03/28/2015  . Gout [M10.9] 03/27/2015  . HCV antibody positive [R89.4] 03/27/2015  . Memory deficit [R41.3] 09/13/2014  . Uncontrolled type 2 diabetes mellitus with nephropathy ([place]) [E11.21, E11.65] 06/07/2014  . Right knee pain [M25.561] 03/16/2014  . Hyperlipidemia [E78.5]   . IDA (iron deficiency anemia) [D50.9] 01/26/2013  . History of colonic polyps [Z86.010] 11/11/2012  . Recurrent falls [R29.6] 10/02/2012  . Urine incontinence [R32] 08/27/2012  . Recurrent ventral hernia [K43.2] 08/11/2012  . OSA on CPAP [G47.33]   . Intertrigo [L30.4] 05/27/2012  . CKD (chronic kidney disease) stage 3, GFR 30-59 ml/min [N18.3]   . Obesity, Class II, BMI 35-39.9, with comorbidity ([place]) [E66.01]   . Colon cancer ([place]) [C18.9] 04/07/2012  . Enterocutaneous fistula [K63.2] 04/07/2012  . HTN (hypertension), benign [I10] 04/07/2012   Total Time spent with patient: 20 minutes  Past Psychiatric History: Bipolar disorder.  Past Medical History:  Past Medical  History  Diagnosis Date  . Bipolar depression [facility])     sees psychiatrist - psych admission 03/2015  . DJD (degenerative joint disease), lumbar     chronic lower back pain  . Obesity (BMI 30-39.9)   . History of colon cancer     s/p surgery  . 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 OSA on CPAP     6cm H2O  . RBBB   . Anemia in chronic kidney disease   . Blood transfusion without reported diagnosis 2009  . Cataract     left  . GERD (gastroesophageal reflux disease)   . Hyperlipidemia   . Hypertension   . History of bladder cancer 1997  . History of uterine cancer     s/p hysterectomy  . CKD (chronic kidney disease) stage 3, GFR 30-59 ml/min   . Personal history of colonic adenomas and colon cancer 11/11/2012  . IDA (iron deficiency anemia) 01/2013    thought due to h/o polyps  . Positive hepatitis C antibody test 09/2014    but negative confirmatory testing  . Colon cancer ([place]) 1990's  . Family history of breast cancer   . Family history of colon cancer   . Family history of stomach cancer   . Uncontrolled type 2 diabetes mellitus with nephropathy ([place]) 06/07/2014    [facility] DSME 11/2015    Past Surgical History  Procedure Laterality Date  . Hernia repair  02/04/12  . Partial colectomy  about 2008    for colon cancer  . I & d abdominal wound  02/19/12  . Removal of infected mesh  and abdominal wound vac placement  02/24/12  . Reexploration of abdominal wound and allograft placemet  02/26/12  . Partial hysterectomy  1981    uterine cancer, R ovary remains  . Left oophorectomy  2005  . Colonoscopy  07/2009  . Dexa  12/2009    WNL  . Dobutamine stress echo  12/2009    no evidence of ischemia  . Colonoscopy  11/2012    2 tubular adenomas, mild diverticulosis, pending genetic testing for Lynch syndrome [name]) rpt 2 yrs  . Sleep study  02/2014    OSA - AHI 55, nadir 81% [name])  . Breast biopsy Right 01/2014    fibroadenoma  . Colonoscopy  02/2015     TA, diverticulosis, rpt 2 yrs [name])   Family History:  Family History  Problem Relation Age of Onset  . Colon cancer Mother 28  . Stomach cancer Mother     dx in her 33s?  Marland Kitchen Colon cancer Sister 10    Maternal half sister  . CAD Father     MI  . Diabetes Other     aunts and uncles both sides  . Arthritis Other     strong fmhx  . Mental illness Sister     anxiety/depression  . Breast cancer Maternal Grandmother   . Esophageal cancer Neg Hx   . Rectal cancer Neg Hx    Family Psychiatric  History: See H&P. Social History:  History  Alcohol Use No     History  Drug Use No    Social History   Social History  . Marital Status: Single    Spouse Name: N/A  . Number of Children: 0  . Years of Education: N/A   Social History Main Topics  . Smoking status: Never Smoker   . Smokeless tobacco: Never Used  . Alcohol Use: No  . Drug Use: No  . Sexual Activity: No   Other Topics Concern  . None   Social History Narrative   Lives with sister, no pets   Occupation: disabled, for bipolar and arthritis   Edu: GED   Activity: take walks   Diet: good water, vegetables daily   Religion: [place], Dr. Jimmye Norman (ph [phone])   Additional Social History:    Pain Medications: see PTA meds Prescriptions: see PTA meds Over the Counter: see PTA meds History of alcohol / drug use?: No history of alcohol / drug abuse Longest period of sobriety (when/how long):  (n/a) Negative Consequences of Use:  (n/a) Withdrawal Symptoms:  (n/a)                    Sleep: Fair  Appetite:  Fair  Current Medications: Current Facility-Administered Medications  Medication Dose Route Frequency Provider Last Rate Last Dose  . acetaminophen (TYLENOL) tablet 650 mg  650 mg Oral Q6H PRN [name], MD   650 mg at 12/05/15 1253  . alum & mag hydroxide-simeth (MAALOX/MYLANTA) 200-200-20 MG/5ML suspension 30 mL  30 mL Oral Q4H PRN [name], MD   30 mL at 12/05/15 1940  . atenolol (TENORMIN) tablet 12.5 mg  12.5 mg Oral Daily [name], MD   12.5 mg at 12/08/15 0844  . chlorproMAZINE (THORAZINE) tablet 25 mg  25 mg Oral QHS Kaci Freel B Layan Zalenski, MD      . divalproex (DEPAKOTE ER) 24 hr tablet 1,500 mg  1,500 mg Oral QHS [name],  MD   1,500 mg at 12/07/15 2214  . docusate sodium (COLACE) capsule 200 mg  200 mg Oral BID [name], MD   200 mg at 12/08/15 0843  . magnesium hydroxide (MILK OF MAGNESIA) suspension 30 mL  30 mL Oral Daily PRN [name], MD   30 mL at 12/07/15 1445  . metFORMIN (GLUCOPHAGE) tablet 500 mg  500 mg Oral Q breakfast [name], MD   500 mg at 12/08/15 0843  . OLANZapine (ZYPREXA) tablet 30 mg  30 mg Oral QHS [name], MD   30 mg at 12/07/15 2214  . polyethylene glycol (MIRALAX / GLYCOLAX) packet 17 g  17 g Oral QODAY [name], MD   17 g at 12/07/15 1445  . pravastatin (PRAVACHOL) tablet 20 mg  20 mg Oral q1800 [name], MD   20 mg at 12/08/15 1638    Lab Results:  Results for orders placed or performed during the hospital encounter of 12/03/15 (from the past 48 hour(s))  Glucose, capillary     Status: None   Collection Time: 12/07/15  6:57 AM  Result Value Ref Range   Glucose-Capillary 89 65 - 99 mg/dL  Glucose, capillary     Status: Abnormal   Collection Time: 12/07/15  4:41 PM  Result Value Ref Range   Glucose-Capillary 62 (L) 65 - 99 mg/dL  Glucose, capillary     Status: None   Collection Time: 12/08/15  6:40 AM  Result Value Ref Range   Glucose-Capillary 89 65 - 99 mg/dL  Glucose, capillary     Status: Abnormal   Collection Time: 12/08/15 11:44 AM  Result Value Ref Range   Glucose-Capillary 133 (H) 65 - 99 mg/dL   Comment 1 Notify RN   Glucose, capillary     Status: None   Collection Time: 12/08/15  4:22 PM  Result Value Ref Range   Glucose-Capillary 67 65 - 99 mg/dL    Comment 1 Notify RN     Blood Alcohol level:  Lab Results  Component Value Date   ETH <5 12/03/2015   ETH <5 11/20/2015    Physical Findings: AIMS: Facial and Oral Movements Muscles of Facial Expression: None, normal Lips and Perioral Area: None, normal Jaw: None, normal Tongue: None, normal,Extremity Movements Upper (arms, wrists, hands, fingers): None, normal Lower (legs, knees, ankles, toes): None, normal, Trunk Movements Neck, shoulders, hips: None, normal, Overall Severity Severity of abnormal movements (highest score from questions above): None, normal Incapacitation due to abnormal movements: None, normal Patient's awareness of abnormal movements (rate only patient's report): No Awareness, Dental Status Current problems with teeth and/or dentures?: No Does patient usually wear dentures?: No  CIWA:    COWS:     Musculoskeletal: Strength & Muscle Tone: within normal limits Gait & Station: normal Patient leans: N/A  Psychiatric Specialty Exam: Review of Systems  Psychiatric/Behavioral: The patient has insomnia.   All other systems reviewed and are negative.   Blood pressure 109/56, pulse 66, temperature 97.9 F (36.6 C), temperature source Oral, resp. rate 20, height 5\' 6"  (1.676 m), weight 86.637 kg (191 lb), SpO2 97 %.Body mass index is 30.84 kg/(m^2).  General Appearance: Casual  Eye Contact::  Good  Speech:  Clear and Coherent  Volume:  Normal  Mood:  Euphoric  Affect:  Appropriate  Thought Process:  Goal Directed  Orientation:  Full (Time, Place, and Person)  Thought Content:  WDL  Suicidal Thoughts:  No  Homicidal Thoughts:  No  Memory:  Immediate;   Fair Recent;   Fair Remote;   Fair  Judgement:  Impaired  Insight:  Present  Psychomotor Activity:  Normal  Concentration:  Fair  Recall:  [employer] of Knowledge:Fair  Language: Fair  Akathisia:  No  Handed:  Right  AIMS (if indicated):     Assets:  Communication Skills Desire for  Improvement Financial Resources/Insurance Housing Resilience Social Support  ADL's:  Intact  Cognition: WNL  Sleep:  Number of Hours: 4.5   Treatment Plan Summary: Daily contact with patient to assess and evaluate symptoms and progress in treatment and Medication management   For bipolar disorder: continue olanzapine 30 mg po q day. Continue depakote ER to 1500 mg qhs. Will check depakote level, LFTs and ammonia on Monday night. Will consider d/c pt on Depakote DR as is less expensive for her.  There are many risks associated with treating her with Depakote as she suffers from a steatosis, and hepatitis C. Depakote level and LFTs will have to be followed up closely. She is not a candidate for other mood stabilizers such as lithium as she suffers from a stage III kidney failure and she is not a candidate for Tegretol as she on a multitude of other medications that can have interactions with Tegretol and Tegretol has same effects on liver as Depakote.  For insomnia: klonopin has been d/ced in order to minimize med regimen  Diabetes: will decrease metformin to 500 mg as Cr Cl is low 39. CBG bid has been wnl.  Hypertension: in order to simplify medication regimen I will d/cmetoprolol 37.5 mg bid and start her on atenolol 12.5 mg q day. If BP elevated this weekend please increase dose to 25 mg q day.  Dyslipidemia the patient will be continued on Pravachol 20 mg a day  Constipation the patient will be continued on Colace 200 mg by mouth twice a day. I will change miralax to every other day  OSA: pt not sleeping well w/o CPAP. Will order CPAP qhs  Falls: Patient does not appear to having issues with her gait however per history she has recurrence falls at home. I will place the patient on fall precautions. No issues with gait here  Diet heart healthy and carb modified  Precautions fall and every 15 minute checks  Hospitalization and status continue voluntary  admission  Disposition she will return to her sister's house once stable  Discharge follow-up she'll continue to follow-up with Dr. Gretel Acre in our clinic.  Will check depakote level along with ammonia, and LFTs on Monday night  Labs HbA1c 6.9, TSH wnl , lipid panel (low HDL) and prolactin mildly elevated  Contacted [facility]: they can deliver her medications for $5 and could prepare her meds in bubble packs for $1. Pt worries about her ability to afford medications.   12/08/2015. We added Thorazine 25 mg tonight for sleep.   Orson Slick, MD 12/08/2015, 5:51 PM

## 2015-12-08 NOTE — Plan of Care (Signed)
Problem: Alteration in mood Goal: STG-Increased ability to follow directions Outcome: Progressing Pt able to follow directions during med pass, snack time, and before bed.

## 2015-12-08 NOTE — Plan of Care (Signed)
Problem: Consults Goal: [facility] Treatment Patient Education Outcome: Progressing Patient open to inpatient policies and procedures of the unit [place] RN

## 2015-12-08 NOTE — Progress Notes (Signed)
D: Patient is alert and oriented on the unit this shift. Patient attended and actively participated in groups today. Patient denies suicidal ideation, homicidal ideation, auditory or visual hallucinations at the present time.  A: Scheduled medications are administered to patient as per MD orders. Emotional support and encouragement are provided. Patient is maintained on q.15 minute safety checks. Patient is informed to notify staff with questions or concerns. R: No adverse medication reactions are noted. Patient is cooperative with medication administration and treatment plan today. Patient is receptive,  Ezzard Standing, anxious but   cooperative on the unit at this time. Patient interacts well with others on the unit this shift. Patient contracts for safety at this time. Patient remains safe at this time.

## 2015-12-08 NOTE — Tx Team (Deleted)
Initial Interdisciplinary Treatment Plan   PATIENT STRESSORS: Loss of family member Traumatic event   PATIENT STRENGTHS: Capable of independent living Motivation for treatment/growth Supportive family/friends   PROBLEM LIST: Problem List/Patient Goals Date to be addressed Date deferred Reason deferred Estimated date of resolution  Depressed Mood      Auditory command hallucinations      Anxiety      Grief and Loss      Witnessed traumatic event                               DISCHARGE CRITERIA:  Improved stabilization in mood, thinking, and/or behavior Motivation to continue treatment in a less acute level of care Need for constant or close observation no longer present Verbal commitment to aftercare and medication compliance  PRELIMINARY DISCHARGE PLAN: Attend aftercare/continuing care group Outpatient therapy Return to previous living arrangement  PATIENT/FAMIILY INVOLVEMENT: This treatment plan has been presented to and reviewed with the patient, Gina [name].  The patient and family have been given the opportunity to ask questions and make suggestions.  Dola Factor 12/08/2015, 6:20 PM

## 2015-12-09 LAB — GLUCOSE, CAPILLARY
GLUCOSE-CAPILLARY: 90 mg/dL (ref 65–99)
GLUCOSE-CAPILLARY: 220 mg/dL — AB (ref 65–99)
Glucose-Capillary: 71 mg/dL (ref 65–99)

## 2015-12-09 MED ORDER — CHLORPROMAZINE HCL 50 MG PO TABS
50.0000 mg | ORAL_TABLET | Freq: Every day | ORAL | Status: DC
  Administered 2015-12-09: 50 mg via ORAL
  Filled 2015-12-09: qty 1
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
## 2015-12-09 NOTE — [facility] Group Notes (Signed)
[place] LCSW Group Therapy  12/09/2015 2:45 PM  Type of Therapy:  Group Therapy  Participation Level:  Active  Participation Quality:  Attentive  Affect:  Excited  Cognitive:  Disorganized  Insight:  Limited  Engagement in Therapy:  Limited  Modes of Intervention:  Discussion, Education, Socialization and Support  Summary of Progress/Problems: Boundaries: Patients defined boundaries and discussed the importance of them. Patients identified their own boundaries and how they feel when they are crossed. Patients discussed ways to create and/ or improve their personal boundaries.  Pt attended group and stayed the entire time. She discussed her faith.   [place] MSW, [place]  12/09/2015, 2:45 PM

## 2015-12-09 NOTE — Progress Notes (Signed)
Presents to nurses station states "I feel a little whoozy, I believe my sugar may be low, I don't know anything about no sugar"  Assisted back to the room.  Instructed to lay down.  Blood sugar checked.  220.  Instructed to get up slowly and that if feels dizzy to call for assistance and not get OOB.

## 2015-12-09 NOTE — Progress Notes (Signed)
Disorganized and tangential.  Hyper verbal.  Intrusive at times.  Medication and group compliant.  Visible in the milieu.  Support and encouragement offered.  Safety maintained.

## 2015-12-09 NOTE — Progress Notes (Signed)
[facility] MD Progress Note  12/09/2015 4:42 PM Gina [name]  MRN:  [id number]  Subjective:  Gina [name] is disorganized and tangential. She complains of poor sleep in spite of 25 mg of Thorazine given. She is extremely anxious about her discharge. She reports that she is unable to manage her diabetes. Does not know how to use glucometer and is confused about the appropriate diet. We'll try to have a consult from diabetes educator. The patient is out of her room in the community and participates well in groups. She tolerates medications well. She does not feel stable for discharge.  Principal Problem: Bipolar disorder, manic, moderate ([place]) Diagnosis:   Patient Active Problem List   Diagnosis Date Noted  . Bipolar disorder, manic, moderate ([place]) [F31.12] 12/03/2015  . NASH (nonalcoholic steatohepatitis) [K75.81] 03/28/2015  . Gout [M10.9] 03/27/2015  . HCV antibody positive [R89.4] 03/27/2015  . Memory deficit [R41.3] 09/13/2014  . Uncontrolled type 2 diabetes mellitus with nephropathy ([place]) [E11.21, E11.65] 06/07/2014  . Right knee pain [M25.561] 03/16/2014  . Hyperlipidemia [E78.5]   . IDA (iron deficiency anemia) [D50.9] 01/26/2013  . History of colonic polyps [Z86.010] 11/11/2012  . Recurrent falls [R29.6] 10/02/2012  . Urine incontinence [R32] 08/27/2012  . Recurrent ventral hernia [K43.2] 08/11/2012  . OSA on CPAP [G47.33]   . Intertrigo [L30.4] 05/27/2012  . CKD (chronic kidney disease) stage 3, GFR 30-59 ml/min [N18.3]   . Obesity, Class II, BMI 35-39.9, with comorbidity ([place]) [E66.01]   . Colon cancer ([place]) [C18.9] 04/07/2012  . Enterocutaneous fistula [K63.2] 04/07/2012  . HTN (hypertension), benign [I10] 04/07/2012   Total Time spent with patient: 20 minutes  Past Psychiatric History: Bipolar disorder.  Past Medical History:  Past Medical History  Diagnosis Date  . Bipolar depression [facility])     sees psychiatrist - psych admission 03/2015  . DJD (degenerative joint  disease), lumbar     chronic lower back pain  . Obesity (BMI 30-39.9)   . History of colon cancer     s/p surgery  . 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 OSA on CPAP     6cm H2O  . RBBB   . Anemia in chronic kidney disease   . Blood transfusion without reported diagnosis 2009  . Cataract     left  . GERD (gastroesophageal reflux disease)   . Hyperlipidemia   . Hypertension   . History of bladder cancer 1997  . History of uterine cancer     s/p hysterectomy  . CKD (chronic kidney disease) stage 3, GFR 30-59 ml/min   . Personal history of colonic adenomas and colon cancer 11/11/2012  . IDA (iron deficiency anemia) 01/2013    thought due to h/o polyps  . Positive hepatitis C antibody test 09/2014    but negative confirmatory testing  . Colon cancer ([place]) 1990's  . Family history of breast cancer   . Family history of colon cancer   . Family history of stomach cancer   . Uncontrolled type 2 diabetes mellitus with nephropathy ([place]) 06/07/2014    [facility] DSME 11/2015    Past Surgical History  Procedure Laterality Date  . Hernia repair  02/04/12  . Partial colectomy  about 2008    for colon cancer  . I & d abdominal wound  02/19/12  . Removal of infected mesh and abdominal wound vac placement  02/24/12  . Reexploration of abdominal wound and allograft placemet  02/26/12  . Partial hysterectomy  1981    uterine cancer, R ovary remains  . Left oophorectomy  2005  . Colonoscopy  07/2009  . Dexa  12/2009    WNL  . Dobutamine stress echo  12/2009    no evidence of ischemia  . Colonoscopy  11/2012    2 tubular adenomas, mild diverticulosis, pending genetic testing for Lynch syndrome [name]) rpt 2 yrs  . Sleep study  02/2014    OSA - AHI 55, nadir 81% [name])  . Breast biopsy Right 01/2014    fibroadenoma  . Colonoscopy  02/2015    TA, diverticulosis, rpt 2 yrs [name])   Family History:  Family History  Problem Relation Age of Onset  . Colon cancer  Mother 33  . Stomach cancer Mother     dx in her 46s?  Marland Kitchen Colon cancer Sister 54    Maternal half sister  . CAD Father     MI  . Diabetes Other     aunts and uncles both sides  . Arthritis Other     strong fmhx  . Mental illness Sister     anxiety/depression  . Breast cancer Maternal Grandmother   . Esophageal cancer Neg Hx   . Rectal cancer Neg Hx    Family Psychiatric  History: See H&P. Social History:  History  Alcohol Use No     History  Drug Use No    Social History   Social History  . Marital Status: Single    Spouse Name: N/A  . Number of Children: 0  . Years of Education: N/A   Social History Main Topics  . Smoking status: Never Smoker   . Smokeless tobacco: Never Used  . Alcohol Use: No  . Drug Use: No  . Sexual Activity: No   Other Topics Concern  . None   Social History Narrative   Lives with sister, no pets   Occupation: disabled, for bipolar and arthritis   Edu: GED   Activity: take walks   Diet: good water, vegetables daily   Religion: [place], Dr. Jimmye Norman (ph [phone])   Additional Social History:    Pain Medications: see PTA meds Prescriptions: see PTA meds Over the Counter: see PTA meds History of alcohol / drug use?: No history of alcohol / drug abuse Longest period of sobriety (when/how long):  (n/a) Negative Consequences of Use:  (n/a) Withdrawal Symptoms:  (n/a)                    Sleep: Fair  Appetite:  Fair  Current Medications: Current Facility-Administered Medications  Medication Dose Route Frequency Provider Last Rate Last Dose  . acetaminophen (TYLENOL) tablet 650 mg  650 mg Oral Q6H PRN [name], MD   650 mg at 12/05/15 1253  . alum & mag hydroxide-simeth (MAALOX/MYLANTA) 200-200-20 MG/5ML suspension 30 mL  30 mL Oral Q4H PRN [name], MD   30 mL at 12/05/15 1940  . atenolol (TENORMIN) tablet 12.5 mg  12.5 mg Oral Daily [name], MD   12.5  mg at 12/09/15 0940  . chlorproMAZINE (THORAZINE) tablet 25 mg  25 mg Oral QHS Clovis Fredrickson, MD   25 mg at 12/08/15 2147  . divalproex (DEPAKOTE ER) 24 hr tablet 1,500 mg  1,500 mg Oral QHS [name], MD   1,500 mg at 12/08/15 2144  . docusate sodium (COLACE) capsule 200 mg  200 mg Oral  BID [name], MD   200 mg at 12/09/15 0939  . magnesium hydroxide (MILK OF MAGNESIA) suspension 30 mL  30 mL Oral Daily PRN [name], MD   30 mL at 12/07/15 1445  . metFORMIN (GLUCOPHAGE) tablet 500 mg  500 mg Oral Q breakfast [name], MD   500 mg at 12/09/15 0940  . OLANZapine (ZYPREXA) tablet 30 mg  30 mg Oral QHS [name], MD   30 mg at 12/08/15 2144  . polyethylene glycol (MIRALAX / GLYCOLAX) packet 17 g  17 g Oral QODAY [name], MD   17 g at 12/09/15 0939  . pravastatin (PRAVACHOL) tablet 20 mg  20 mg Oral q1800 [name], MD   20 mg at 12/08/15 1638    Lab Results:  Results for orders placed or performed during the hospital encounter of 12/03/15 (from the past 48 hour(s))  Glucose, capillary     Status: None   Collection Time: 12/08/15  6:40 AM  Result Value Ref Range   Glucose-Capillary 89 65 - 99 mg/dL  Glucose, capillary     Status: Abnormal   Collection Time: 12/08/15 11:44 AM  Result Value Ref Range   Glucose-Capillary 133 (H) 65 - 99 mg/dL   Comment 1 Notify RN   Glucose, capillary     Status: None   Collection Time: 12/08/15  4:22 PM  Result Value Ref Range   Glucose-Capillary 67 65 - 99 mg/dL   Comment 1 Notify RN   Glucose, capillary     Status: Abnormal   Collection Time: 12/08/15  6:18 PM  Result Value Ref Range   Glucose-Capillary 163 (H) 65 - 99 mg/dL  Glucose, capillary     Status: Abnormal   Collection Time: 12/08/15  9:43 PM  Result Value Ref Range   Glucose-Capillary 124 (H) 65 - 99 mg/dL  Glucose, capillary     Status: None   Collection Time: 12/09/15  5:50 AM  Result  Value Ref Range   Glucose-Capillary 90 65 - 99 mg/dL  Glucose, capillary     Status: Abnormal   Collection Time: 12/09/15  9:26 AM  Result Value Ref Range   Glucose-Capillary 220 (H) 65 - 99 mg/dL   Comment 1 Document in Chart     Blood Alcohol level:  Lab Results  Component Value Date   ETH <5 12/03/2015   ETH <5 11/20/2015    Physical Findings: AIMS: Facial and Oral Movements Muscles of Facial Expression: None, normal Lips and Perioral Area: None, normal Jaw: None, normal Tongue: None, normal,Extremity Movements Upper (arms, wrists, hands, fingers): None, normal Lower (legs, knees, ankles, toes): None, normal, Trunk Movements Neck, shoulders, hips: None, normal, Overall Severity Severity of abnormal movements (highest score from questions above): None, normal Incapacitation due to abnormal movements: None, normal Patient's awareness of abnormal movements (rate only patient's report): No Awareness, Dental Status Current problems with teeth and/or dentures?: No Does patient usually wear dentures?: No  CIWA:    COWS:     Musculoskeletal: Strength & Muscle Tone: within normal limits Gait & Station: normal Patient leans: N/A  Psychiatric Specialty Exam: Review of Systems  Psychiatric/Behavioral: The patient is nervous/anxious and has insomnia.   All other systems reviewed and are negative.   Blood pressure 146/66, pulse 79, temperature 97.9 F (36.6 C), temperature source Oral, resp. rate 20, height 5\' 6"  (1.676 m), weight 86.637 kg (191 lb), SpO2 97 %.Body mass index is 30.84 kg/(m^2).  General Appearance: Casual  Eye Contact::  Good  Speech:  Clear and Coherent  Volume:  Normal  Mood:  Anxious  Affect:  Appropriate  Thought Process:  Disorganized  Orientation:  Full (Time, Place, and Person)  Thought Content:  WDL  Suicidal Thoughts:  No  Homicidal Thoughts:  No  Memory:  Immediate;   Fair Recent;   Fair Remote;   Fair  Judgement:  Impaired  Insight:   Shallow  Psychomotor Activity:  Normal  Concentration:  Fair  Recall:  [place]: Fair  Akathisia:  No  Handed:  Right  AIMS (if indicated):     Assets:  Communication Skills Desire for Improvement Financial Resources/Insurance Housing Resilience Social Support  ADL's:  Intact  Cognition: WNL  Sleep:  Number of Hours: 4.5   Treatment Plan Summary: Daily contact with patient to assess and evaluate symptoms and progress in treatment and Medication management   For bipolar disorder: continue olanzapine 30 mg po q day. Continue depakote ER to 1500 mg qhs. Will check depakote level, LFTs and ammonia on Monday night. Will consider d/c pt on Depakote DR as is less expensive for her.  There are many risks associated with treating her with Depakote as she suffers from a steatosis, and hepatitis C. Depakote level and LFTs will have to be followed up closely. She is not a candidate for other mood stabilizers such as lithium as she suffers from a stage III kidney failure and she is not a candidate for Tegretol as she on a multitude of other medications that can have interactions with Tegretol and Tegretol has same effects on liver as Depakote.  For insomnia: klonopin has been d/ced in order to minimize med regimen  Diabetes: will decrease metformin to 500 mg as Cr Cl is low 39. CBG bid has been wnl.  Hypertension: in order to simplify medication regimen I will d/cmetoprolol 37.5 mg bid and start her on atenolol 12.5 mg q day. If BP elevated this weekend please increase dose to 25 mg q day.  Dyslipidemia the patient will be continued on Pravachol 20 mg a day  Constipation the patient will be continued on Colace 200 mg by mouth twice a day. I will change miralax to every other day  OSA: pt not sleeping well w/o CPAP. Will order CPAP qhs  Falls: Patient does not appear to having issues with her gait however per history she has recurrence falls at home. I  will place the patient on fall precautions. No issues with gait here  Diet heart healthy and carb modified  Precautions fall and every 15 minute checks  Hospitalization and status continue voluntary admission  Disposition she will return to her sister's house once stable  Discharge follow-up she'll continue to follow-up with Dr. Gretel Acre in our clinic.  Will check depakote level along with ammonia, and LFTs on Monday night  Labs HbA1c 6.9, TSH wnl , lipid panel (low HDL) and prolactin mildly elevated  Contacted [facility]: they can deliver her medications for $5 and could prepare her meds in bubble packs for $1. Pt worries about her ability to afford medications.   12/08/2015. We added Thorazine 25 mg tonight for sleep. 12/09/2015. We will increase Thorazine to 50 mg for sleep.    Orson Slick, MD 12/09/2015, 4:42 PM

## 2015-12-09 NOTE — [facility] Group Notes (Signed)
[place] Group Notes:  (Nursing/MHT/Case Management/Adjunct)  Date:  12/09/2015  Time:  9:44 PM  Type of Therapy:  Group Therapy  Participation Level:  Active  Participation Quality:  Appropriate  Affect:  Appropriate  Cognitive:  Appropriate  Insight:  Appropriate  Engagement in Group:  Engaged  Modes of Intervention:  Discussion  Summary of Progress/Problems:  Kandis Fantasia 12/09/2015, 9:44 PM

## 2015-12-10 LAB — HEPATIC FUNCTION PANEL
ALBUMIN: 4.1 g/dL (ref 3.5–5.0)
ALT: 25 U/L (ref 14–54)
AST: 29 U/L (ref 15–41)
Alkaline Phosphatase: 107 U/L (ref 38–126)
Bilirubin, Direct: <0.1 mg/dL — ABNORMAL LOW (ref 0.1–0.5)
TOTAL PROTEIN: 7 g/dL (ref 6.5–8.1)
Total Bilirubin: 0.5 mg/dL (ref 0.3–1.2)

## 2015-12-10 LAB — GLUCOSE, CAPILLARY
GLUCOSE-CAPILLARY: 118 mg/dL — AB (ref 65–99)
Glucose-Capillary: 88 mg/dL (ref 65–99)

## 2015-12-10 LAB — AMMONIA: Ammonia: 40 umol/L — ABNORMAL HIGH (ref 9–35)

## 2015-12-10 LAB — VALPROIC ACID LEVEL: VALPROIC ACID LVL: 109 ug/mL — AB (ref 50.0–100.0)

## 2015-12-10 MED ORDER — DIVALPROEX SODIUM ER 500 MG PO TB24
1500.0000 mg | ORAL_TABLET | Freq: Every day | ORAL | Status: DC
Start: 2015-12-10 — End: 2015-12-11
  Administered 2015-12-10: 1500 mg via ORAL
  Filled 2015-12-10: qty 3

## 2015-12-10 MED ORDER — ATENOLOL 25 MG PO TABS
25.0000 mg | ORAL_TABLET | Freq: Every day | ORAL | Status: DC
  Administered 2015-12-10: 25 mg via ORAL
  Filled 2015-12-10: qty 1

## 2015-12-10 MED ORDER — OLANZAPINE 10 MG PO TABS
30.0000 mg | ORAL_TABLET | Freq: Every day | ORAL | Status: DC
  Administered 2015-12-10 – 2015-12-18 (×9): 30 mg via ORAL
  Filled 2015-12-10 (×9): qty 3

## 2015-12-10 MED ORDER — DOCUSATE SODIUM 100 MG PO CAPS
200.0000 mg | ORAL_CAPSULE | Freq: Two times a day (BID) | ORAL | Status: DC
  Administered 2015-12-10 – 2015-12-19 (×19): 200 mg via ORAL
  Filled 2015-12-10 (×20): qty 2

## 2015-12-10 MED ORDER — ATENOLOL 25 MG PO TABS
25.0000 mg | ORAL_TABLET | Freq: Every day | ORAL | Status: DC
  Administered 2015-12-11 – 2015-12-17 (×7): 25 mg via ORAL
  Filled 2015-12-10 (×7): qty 1

## 2015-12-10 NOTE — Progress Notes (Signed)
D: Patient appears bright on the unit. Speech is tangential and disorganized. Still hyperreligious during conversations. Denies SI/HI/AVH. Patient commented if you commit suicide the insurance won't pay for it. Interactions with peers and visible in the milieu. Went to group. Wearing CPAP this evening.  A: Medication was given with education. Encouragement was provided.  R: Patient was compliant with medication. She has remained calm and cooperative. Safety maintained with 15 min checks. Safety also maintained with monitor for CPAP.

## 2015-12-10 NOTE — [facility] Group Notes (Signed)
[place] Group Notes:  (Nursing/MHT/Case Management/Adjunct)  Date:  12/10/2015  Time:  4:48 PM  Type of Therapy:  Psychoeducational Skills  Participation Level:  Active  Participation Quality:  Sharing  Affect:  Anxious  Cognitive:  Alert  Insight:  Improving  Engagement in Group:  Engaged  Modes of Intervention:  Support  Summary of Progress/Problems:  Nehemiah Settle 12/10/2015, 4:48 PM

## 2015-12-10 NOTE — Progress Notes (Signed)
Recreation Therapy Notes  Date: 05.08.17 Time: 9:30 am Location: Craft Room  Group Topic: Self-expression  Goal Area(s) Addresses:  Patient will identify one color per emotion listed on the wheel. Patient will verbalize one emotion experienced during session. Patient will be educated on other forms of self-expression.  Behavioral Response: Attentive, Interactive  Intervention: Emotion Wheel  Activity: Patients were given an Emotion Wheel worksheet and instructed to pick a color for each emotion listed.  Education: LRT educated group on other forms of self-expression.  Education Outcome: In group clarification offered  Clinical Observations/Feedback: Patient completed activity by picking a color for each emotion. Patient contributed to group discussion by stating some of the colors she picked for certain emotions. Patient said the Lord's prayer at the end of group.  Leonette Monarch, LRT/CTRS 12/10/2015 10:27 AM

## 2015-12-10 NOTE — Plan of Care (Signed)
Problem: Ineffective individual coping Goal: STG:Pt. will utilize relaxation techniques to reduce stress STG: Patient will utilize relaxation techniques to reduce stress levels  Outcome: Progressing Patient using crossword puzzle to relax in room.

## 2015-12-10 NOTE — Progress Notes (Signed)
Speech is tangential.  Intrusive.  Requires frequent re-direction.  Visible in the milieu.  Medication and group compliant.  Denies SI/HI/AVH.  Support and encouragement offered.  Safety maintained.

## 2015-12-10 NOTE — Progress Notes (Addendum)
[facility] MD Progress Note  12/11/2015 4:34 PM TANICIA WEATHERWAX  MRN:  [id number]  Subjective:  Ms. Helmig is very tangential. She complains of poor sleep in spite of 50 mg of Thorazine given and use of CPAP. She states that she woke up last night and was dizzy and had to have the nurse accompany her to the restroom. She does not feel stable for discharge. She reports it would "be best for everyone" if she stayed here indefinitely. She reports that she is unable to manage her diabetes. Does not know how to use glucometer and is confused about the appropriate diet. We'll try to have a consult from diabetes educator. Otherwise, the patient is out of her room in the community and participates well in groups. She continues to be disorganized and hyper-religious.   Per Nursing: D: Patient appears bright on the unit. Still hyperreligious during conversations. Denies SI/HI/AVH. Denies pain. Interactions with peers and visible in the milieu. Went to group. Wearing CPAP this evening.  A: Medication was given with education. Encouragement was provided.  R: Patient was compliant with medication. She has remained calm and cooperative. Safety maintained with 15 min checks. Safety also maintained with monitor for CPAP.  Principal Problem: Bipolar disorder, manic, moderate ([place]) Diagnosis:   Patient Active Problem List   Diagnosis Date Noted  . Bipolar disorder, manic, moderate ([place]) [F31.12] 12/03/2015  . NASH (nonalcoholic steatohepatitis) [K75.81] 03/28/2015  . Gout [M10.9] 03/27/2015  . HCV antibody positive [R89.4] 03/27/2015  . Memory deficit [R41.3] 09/13/2014  . Uncontrolled type 2 diabetes mellitus with nephropathy ([place]) [E11.21, E11.65] 06/07/2014  . Right knee pain [M25.561] 03/16/2014  . Hyperlipidemia [E78.5]   . IDA (iron deficiency anemia) [D50.9] 01/26/2013  . History of colonic polyps [Z86.010] 11/11/2012  . Recurrent falls [R29.6] 10/02/2012  . Urine incontinence [R32] 08/27/2012  .  Recurrent ventral hernia [K43.2] 08/11/2012  . OSA on CPAP [G47.33]   . Intertrigo [L30.4] 05/27/2012  . CKD (chronic kidney disease) stage 3, GFR 30-59 ml/min [N18.3]   . Obesity, Class II, BMI 35-39.9, with comorbidity ([place]) [E66.01]   . Colon cancer ([place]) [C18.9] 04/07/2012  . Enterocutaneous fistula [K63.2] 04/07/2012  . HTN (hypertension), benign [I10] 04/07/2012   Total Time spent with patient: 30 minutes  Past Psychiatric History: Bipolar disorder.  Past Medical History:  Past Medical History  Diagnosis Date  . Bipolar depression [facility])     sees psychiatrist - psych admission 03/2015  . DJD (degenerative joint disease), lumbar     chronic lower back pain  . Obesity (BMI 30-39.9)   . History of colon cancer     s/p surgery  . 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 OSA on CPAP     6cm H2O  . RBBB   . Anemia in chronic kidney disease   . Blood transfusion without reported diagnosis 2009  . Cataract     left  . GERD (gastroesophageal reflux disease)   . Hyperlipidemia   . Hypertension   . History of bladder cancer 1997  . History of uterine cancer     s/p hysterectomy  . CKD (chronic kidney disease) stage 3, GFR 30-59 ml/min   . Personal history of colonic adenomas and colon cancer 11/11/2012  . IDA (iron deficiency anemia) 01/2013    thought due to h/o polyps  . Positive hepatitis C antibody test 09/2014    but negative confirmatory testing  . Colon cancer ([place]) 1990's  .  Family history of breast cancer   . Family history of colon cancer   . Family history of stomach cancer   . Uncontrolled type 2 diabetes mellitus with nephropathy ([place]) 06/07/2014    [facility] 11/2015    Past Surgical History  Procedure Laterality Date  . Hernia repair  02/04/12  . Partial colectomy  about 2008    for colon cancer  . I & d abdominal wound  02/19/12  . Removal of infected mesh and abdominal wound vac placement  02/24/12  . Reexploration of abdominal  wound and allograft placemet  02/26/12  . Partial hysterectomy  1981    uterine cancer, R ovary remains  . Left oophorectomy  2005  . Colonoscopy  07/2009  . Dexa  12/2009    WNL  . Dobutamine stress echo  12/2009    no evidence of ischemia  . Colonoscopy  11/2012    2 tubular adenomas, mild diverticulosis, pending genetic testing for Lynch syndrome [name]) rpt 2 yrs  . Sleep study  02/2014    OSA - AHI 55, nadir 81% [name])  . Breast biopsy Right 01/2014    fibroadenoma  . Colonoscopy  02/2015    TA, diverticulosis, rpt 2 yrs [name])   Family History:  Family History  Problem Relation Age of Onset  . Colon cancer Mother 20  . Stomach cancer Mother     dx in her 54s?  Marland Kitchen Colon cancer Sister 52    Maternal half sister  . CAD Father     MI  . Diabetes Other     aunts and uncles both sides  . Arthritis Other     strong fmhx  . Mental illness Sister     anxiety/depression  . Breast cancer Maternal Grandmother   . Esophageal cancer Neg Hx   . Rectal cancer Neg Hx    Family Psychiatric  History: See H&P. Social History:  History  Alcohol Use No     History  Drug Use No    Social History   Social History  . Marital Status: Single    Spouse Name: N/A  . Number of Children: 0  . Years of Education: N/A   Social History Main Topics  . Smoking status: Never Smoker   . Smokeless tobacco: Never Used  . Alcohol Use: No  . Drug Use: No  . Sexual Activity: No   Other Topics Concern  . None   Social History Narrative   Lives with sister, no pets   Occupation: disabled, for bipolar and arthritis   Edu: GED   Activity: take walks   Diet: good water, vegetables daily   Religion: Catholic      Psych - [employer], Dr. Jimmye Norman (ph [phone])   Sleep: Fair  Appetite:  Fair  Current Medications: Current Facility-Administered Medications  Medication Dose Route Frequency Provider Last Rate Last Dose  . acetaminophen (TYLENOL) tablet 650 mg  650 mg  Oral Q6H PRN [name], MD   650 mg at 12/05/15 1253  . alum & mag hydroxide-simeth (MAALOX/MYLANTA) 200-200-20 MG/5ML suspension 30 mL  30 mL Oral Q4H PRN [name], MD   30 mL at 12/10/15 1608  . atenolol (TENORMIN) tablet 25 mg  25 mg Oral Q breakfast [name], MD   25 mg at 12/11/15 0753  . divalproex (DEPAKOTE) DR tablet 500 mg  500 mg Oral BID WC [name], MD      . docusate  sodium (COLACE) capsule 200 mg  200 mg Oral BID WC [name], MD   200 mg at 12/11/15 0753  . [START ON 12/12/2015] lactulose (CHRONULAC) 10 GM/15ML solution 10 g  10 g Oral Q breakfast [name], MD      . magnesium hydroxide (MILK OF MAGNESIA) suspension 30 mL  30 mL Oral Daily PRN [name], MD   30 mL at 12/07/15 1445  . metFORMIN (GLUCOPHAGE) tablet 500 mg  500 mg Oral Q breakfast [name], MD   500 mg at 12/11/15 0753  . OLANZapine (ZYPREXA) tablet 30 mg  30 mg Oral QPC supper [name], MD   30 mg at 12/10/15 1711  . pravastatin (PRAVACHOL) tablet 20 mg  20 mg Oral q1800 [name], MD   20 mg at 12/10/15 1712  . white petrolatum (VASELINE) gel   Topical PRN [name], MD        Lab Results:  Results for orders placed or performed during the hospital encounter of 12/03/15 (from the past 48 hour(s))  Glucose, capillary     Status: None   Collection Time: 12/09/15  4:53 PM  Result Value Ref Range   Glucose-Capillary 71 65 - 99 mg/dL  Glucose, capillary     Status: Abnormal   Collection Time: 12/10/15  3:21 AM  Result Value Ref Range   Glucose-Capillary 118 (H) 65 - 99 mg/dL  Glucose, capillary     Status: None   Collection Time: 12/10/15  6:41 AM  Result Value Ref Range   Glucose-Capillary 88 65 - 99 mg/dL   Comment 1 Notify RN   Ammonia     Status: Abnormal   Collection Time: 12/10/15  5:00 PM  Result Value Ref Range   Ammonia 40 (H) 9 - 35 umol/L  Hepatic function panel      Status: Abnormal   Collection Time: 12/10/15  5:00 PM  Result Value Ref Range   Total Protein 7.0 6.5 - 8.1 g/dL   Albumin 4.1 3.5 - 5.0 g/dL   AST 29 15 - 41 U/L   ALT 25 14 - 54 U/L   Alkaline Phosphatase 107 38 - 126 U/L   Total Bilirubin 0.5 0.3 - 1.2 mg/dL   Bilirubin, Direct <0.1 (L) 0.1 - 0.5 mg/dL   Indirect Bilirubin NOT CALCULATED 0.3 - 0.9 mg/dL  Valproic acid level     Status: Abnormal   Collection Time: 12/10/15  5:00 PM  Result Value Ref Range   Valproic Acid Lvl 109 (H) 50.0 - 100.0 ug/mL  Glucose, capillary     Status: None   Collection Time: 12/11/15  7:52 AM  Result Value Ref Range   Glucose-Capillary 94 65 - 99 mg/dL    Blood Alcohol level:  Lab Results  Component Value Date   ETH <5 12/03/2015   ETH <5 11/20/2015    Musculoskeletal: Strength & Muscle Tone: within normal limits Gait & Station: normal Patient leans: N/A  Psychiatric Specialty Exam: Review of Systems  Gastrointestinal: Positive for constipation. Negative for nausea, vomiting, abdominal pain and diarrhea.  Neurological: Negative for dizziness.  Psychiatric/Behavioral: Positive for memory loss. Negative for depression, suicidal ideas, hallucinations and substance abuse. The patient is not nervous/anxious and does not have insomnia.   All other systems reviewed and are negative.   Blood pressure 145/94, pulse 84, temperature 98.6 F (37 C), temperature source Oral, resp. rate 18, height 5\' 6"  (1.676 m), weight 89.132 kg (196 lb 8 oz), SpO2  97 %.Body mass index is 31.73 kg/(m^2).  General Appearance: Casual  Eye Contact::  Good  Speech:  Clear and Coherent  Volume:  Normal  Mood:  Euphoric  Affect:  Appropriate  Thought Process:  Tangential  Orientation:  Full (Time, Place, and Person)  Thought Content:  WDL  Suicidal Thoughts:  No  Homicidal Thoughts:  No  Memory:  Immediate;   Fair Recent;   Poor Remote;   Poor  Judgement:  Impaired  Insight:  Shallow  Psychomotor Activity:   Increased  Concentration:  Poor  Recall:  [place]  Language: Fair  Akathisia:  No  Handed:  Right  AIMS (if indicated):     Assets:  Communication Skills Desire for Improvement Housing  ADL's:  Intact  Cognition: WNL  Sleep:  Number of Hours: 6.75   Treatment Plan Summary: Daily contact with patient to assess and evaluate symptoms and progress in treatment and Medication management   For bipolar disorder: continue olanzapine 30 mg po q day. Continue depakote ER to 1500 mg qhs. Will check depakote level, LFTs and ammonia tonight. Will consider d/c pt on Depakote DR as is less expensive for her. Still manic (pressure speech, hyperverbal, psychomotor agitation, insomnia, tangential thought process, euphoric mood).   There are many risks associated with treating her with Depakote as she suffers from a steatosis, and hepatitis C. Depakote level and LFTs will have to be followed up closely. She is not a candidate for other mood stabilizers such as lithium as she suffers from a stage III kidney failure and she is not a candidate for Tegretol as she on a multitude of other medications that can have interactions with Tegretol and Tegretol has same effects on liver as Depakote.  For insomnia: continue using CPAP, will d/c thorazine due to dizziness  Diabetes: will decrease metformin to 500 mg as Cr Cl is low 39. CBG will be d/c as CBG has been mostly nl.  Hypertension: continue atenolol but will increase to 25 mg q day  Dyslipidemia the patient will be continued on Pravachol 20 mg a day  Constipation the patient will be continued on Colace 200 mg by mouth twice a day and  miralax to every other day  OSA: pt not sleeping well w/o CPAP. Will order CPAP qhs  Falls: Patient does not appear to having issues with her gait however per history she has recurrence falls at home. I will place the patient on fall precautions. No issues with gait here  Diet heart healthy and  carb modified  Precautions fall and every 15 minute checks  Hospitalization and status continue voluntary admission  Disposition she will return to her sister's house once stable  Discharge follow-up she'll continue to follow-up with Dr. Gretel Acre in our clinic.  Will check depakote level along with ammonia, and LFTs tonight  Labs HbA1c 6.9, TSH wnl , lipid panel (low HDL) and prolactin mildly elevated  Contacted [facility]: they can deliver her medications for $5 and could prepare her meds in bubble packs for $1. Pt worries about her ability to afford medications.    [name], MD 12/11/2015, 4:34 PM

## 2015-12-10 NOTE — Plan of Care (Signed)
Problem: Ineffective individual coping Goal: STG: Patient will remain free from self harm Outcome: Progressing Safety has been maintained since admission.

## 2015-12-10 NOTE — [facility] Group Notes (Signed)
[facility] LCSW Group Therapy  12/10/2015 3:21 PM  Type of Therapy:  Group Therapy  Participation Level:  Active  Participation Quality:  Intrusive, Monopolizing and Redirectable  Affect:  Excited and Labile  Cognitive:  Disorganized  Insight:  Developing/Improving  Engagement in Therapy:  Limited  Modes of Intervention:  Socialization and Support  Summary of Progress/Problems: Patient attended group and participated introducing herself and sharing during an introductory exercise that if she were an animal, she would be "a Holiday representative.Marland KitchenMarland KitchenMarland KitchenI like their fur.....". Patient is still manic and monopolizing group but redirectable. Patient left group early for about 10 minutes and returned.    Keene Breath, MSW, LCSW 12/10/2015, 3:21 PM

## 2015-12-10 NOTE — Progress Notes (Signed)
D: Patient appears bright on the unit. Still hyperreligious during conversations. Denies SI/HI/AVH. Denies pain. Interactions with peers and visible in the milieu. Went to group. Wearing CPAP this evening.  A: Medication was given with education. Encouragement was provided.  R: Patient was compliant with medication. She has remained calm and cooperative. Safety maintained with 15 min checks. Safety also maintained with monitor for CPAP.

## 2015-12-10 NOTE — Progress Notes (Signed)
Patient called the call button around 0300 complaining of dizziness and not feeling right. VS were taken. HR 77, BP 135/65. CBG 118. Patient went to the bathroom and then went back to bed. This writer accompanied patient to bathroom to avoid fall.

## 2015-12-11 LAB — GLUCOSE, CAPILLARY
Glucose-Capillary: 94 mg/dL (ref 65–99)
Glucose-Capillary: 75 mg/dL (ref 65–99)

## 2015-12-11 MED ORDER — LACTULOSE 10 GM/15ML PO SOLN
10.0000 g | Freq: Every day | ORAL | Status: DC
  Administered 2015-12-12: 10 g via ORAL
  Filled 2015-12-11: qty 30

## 2015-12-11 MED ORDER — WHITE PETROLATUM GEL: Status: DC | PRN

## 2015-12-11 MED ORDER — DIVALPROEX SODIUM 500 MG PO DR TAB
500.0000 mg | DELAYED_RELEASE_TABLET | Freq: Two times a day (BID) | ORAL | Status: DC
  Administered 2015-12-11 – 2015-12-19 (×17): 500 mg via ORAL
  Filled 2015-12-11 (×17): qty 1

## 2015-12-11 NOTE — Progress Notes (Signed)
[facility] MD Progress Note  12/11/2015 2:05 PM Gina [name]  MRN:  [id number]  Subjective:  Gina [name] is very tangential. She complains of multiple things, she is constipated today, she wants to get a mammogram while she is here to make sure she does not have cancer, she worries about her "sugar diabetes". Attending groups buy hyper religious and intrusive.  Slept better last night. Denies having SE.   Per Nursing:  Patient appears bright on the unit. Speech is tangential and disorganized. Still hyperreligious during conversations. Denies SI/HI/AVH. Patient commented if you commit suicide the insurance won't pay for it. Interactions with peers and visible in the milieu. Went to group. Wearing CPAP this evening.    Principal Problem: Bipolar disorder, manic, moderate ([place]) Diagnosis:   Patient Active Problem List   Diagnosis Date Noted  . Bipolar disorder, manic, moderate ([place]) [F31.12] 12/03/2015  . NASH (nonalcoholic steatohepatitis) [K75.81] 03/28/2015  . Gout [M10.9] 03/27/2015  . HCV antibody positive [R89.4] 03/27/2015  . Memory deficit [R41.3] 09/13/2014  . Uncontrolled type 2 diabetes mellitus with nephropathy ([place]) [E11.21, E11.65] 06/07/2014  . Right knee pain [M25.561] 03/16/2014  . Hyperlipidemia [E78.5]   . IDA (iron deficiency anemia) [D50.9] 01/26/2013  . History of colonic polyps [Z86.010] 11/11/2012  . Recurrent falls [R29.6] 10/02/2012  . Urine incontinence [R32] 08/27/2012  . Recurrent ventral hernia [K43.2] 08/11/2012  . OSA on CPAP [G47.33]   . Intertrigo [L30.4] 05/27/2012  . CKD (chronic kidney disease) stage 3, GFR 30-59 ml/min [N18.3]   . Obesity, Class II, BMI 35-39.9, with comorbidity ([place]) [E66.01]   . Colon cancer ([place]) [C18.9] 04/07/2012  . Enterocutaneous fistula [K63.2] 04/07/2012  . HTN (hypertension), benign [I10] 04/07/2012   Total Time spent with patient: 30 minutes  Past Psychiatric History: Bipolar disorder.  Past Medical History:   Past Medical History  Diagnosis Date  . Bipolar depression [facility])     sees psychiatrist - psych admission 03/2015  . DJD (degenerative joint disease), lumbar     chronic lower back pain  . Obesity (BMI 30-39.9)   . History of colon cancer     s/p surgery  . 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 OSA on CPAP     6cm H2O  . RBBB   . Anemia in chronic kidney disease   . Blood transfusion without reported diagnosis 2009  . Cataract     left  . GERD (gastroesophageal reflux disease)   . Hyperlipidemia   . Hypertension   . History of bladder cancer 1997  . History of uterine cancer     s/p hysterectomy  . CKD (chronic kidney disease) stage 3, GFR 30-59 ml/min   . Personal history of colonic adenomas and colon cancer 11/11/2012  . IDA (iron deficiency anemia) 01/2013    thought due to h/o polyps  . Positive hepatitis C antibody test 09/2014    but negative confirmatory testing  . Colon cancer ([place]) 1990's  . Family history of breast cancer   . Family history of colon cancer   . Family history of stomach cancer   . Uncontrolled type 2 diabetes mellitus with nephropathy ([place]) 06/07/2014    [facility] DSME 11/2015    Past Surgical History  Procedure Laterality Date  . Hernia repair  02/04/12  . Partial colectomy  about 2008    for colon cancer  . I & d abdominal wound  02/19/12  . Removal of infected mesh and abdominal wound  vac placement  02/24/12  . Reexploration of abdominal wound and allograft placemet  02/26/12  . Partial hysterectomy  1981    uterine cancer, R ovary remains  . Left oophorectomy  2005  . Colonoscopy  07/2009  . Dexa  12/2009    WNL  . Dobutamine stress echo  12/2009    no evidence of ischemia  . Colonoscopy  11/2012    2 tubular adenomas, mild diverticulosis, pending genetic testing for Lynch syndrome [name]) rpt 2 yrs  . Sleep study  02/2014    OSA - AHI 55, nadir 81% [name])  . Breast biopsy Right 01/2014    fibroadenoma  .  Colonoscopy  02/2015    TA, diverticulosis, rpt 2 yrs [name])   Family History:  Family History  Problem Relation Age of Onset  . Colon cancer Mother 31  . Stomach cancer Mother     dx in her 62s?  Marland Kitchen Colon cancer Sister 41    Maternal half sister  . CAD Father     MI  . Diabetes Other     aunts and uncles both sides  . Arthritis Other     strong fmhx  . Mental illness Sister     anxiety/depression  . Breast cancer Maternal Grandmother   . Esophageal cancer Neg Hx   . Rectal cancer Neg Hx    Family Psychiatric  History: See H&P. Social History:  History  Alcohol Use No     History  Drug Use No    Social History   Social History  . Marital Status: Single    Spouse Name: N/A  . Number of Children: 0  . Years of Education: N/A   Social History Main Topics  . Smoking status: Never Smoker   . Smokeless tobacco: Never Used  . Alcohol Use: No  . Drug Use: No  . Sexual Activity: No   Other Topics Concern  . None   Social History Narrative   Lives with sister, no pets   Occupation: disabled, for bipolar and arthritis   Edu: GED   Activity: take walks   Diet: good water, vegetables daily   Religion: Catholic      Psych - [employer], Dr. Jimmye Norman (ph [phone])   Sleep: Fair  Appetite:  Fair  Current Medications: Current Facility-Administered Medications  Medication Dose Route Frequency Provider Last Rate Last Dose  . acetaminophen (TYLENOL) tablet 650 mg  650 mg Oral Q6H PRN [name], MD   650 mg at 12/05/15 1253  . alum & mag hydroxide-simeth (MAALOX/MYLANTA) 200-200-20 MG/5ML suspension 30 mL  30 mL Oral Q4H PRN [name], MD   30 mL at 12/10/15 1608  . atenolol (TENORMIN) tablet 25 mg  25 mg Oral Q breakfast [name], MD   25 mg at 12/11/15 0753  . divalproex (DEPAKOTE) DR tablet 500 mg  500 mg Oral BID WC [name], MD      . docusate sodium (COLACE) capsule 200 mg  200 mg Oral BID WC [name], MD   200 mg at 12/11/15 0753  . [START ON 12/12/2015] lactulose (CHRONULAC) 10 GM/15ML solution 10 g  10 g Oral Q breakfast [name], MD      . magnesium hydroxide (MILK OF MAGNESIA) suspension 30 mL  30 mL Oral Daily PRN [name], MD   30 mL at 12/07/15 1445  . metFORMIN (GLUCOPHAGE) tablet 500 mg  500 mg Oral Q breakfast  [name], MD   500 mg at 12/11/15 0753  . OLANZapine (ZYPREXA) tablet 30 mg  30 mg Oral QPC supper [name], MD   30 mg at 12/10/15 1711  . pravastatin (PRAVACHOL) tablet 20 mg  20 mg Oral q1800 [name], MD   20 mg at 12/10/15 1712    Lab Results:  Results for orders placed or performed during the hospital encounter of 12/03/15 (from the past 48 hour(s))  Glucose, capillary     Status: None   Collection Time: 12/09/15  4:53 PM  Result Value Ref Range   Glucose-Capillary 71 65 - 99 mg/dL  Glucose, capillary     Status: Abnormal   Collection Time: 12/10/15  3:21 AM  Result Value Ref Range   Glucose-Capillary 118 (H) 65 - 99 mg/dL  Glucose, capillary     Status: None   Collection Time: 12/10/15  6:41 AM  Result Value Ref Range   Glucose-Capillary 88 65 - 99 mg/dL   Comment 1 Notify RN   Ammonia     Status: Abnormal   Collection Time: 12/10/15  5:00 PM  Result Value Ref Range   Ammonia 40 (H) 9 - 35 umol/L  Hepatic function panel     Status: Abnormal   Collection Time: 12/10/15  5:00 PM  Result Value Ref Range   Total Protein 7.0 6.5 - 8.1 g/dL   Albumin 4.1 3.5 - 5.0 g/dL   AST 29 15 - 41 U/L   ALT 25 14 - 54 U/L   Alkaline Phosphatase 107 38 - 126 U/L   Total Bilirubin 0.5 0.3 - 1.2 mg/dL   Bilirubin, Direct <0.1 (L) 0.1 - 0.5 mg/dL   Indirect Bilirubin NOT CALCULATED 0.3 - 0.9 mg/dL  Valproic acid level     Status: Abnormal   Collection Time: 12/10/15  5:00 PM  Result Value Ref Range   Valproic Acid Lvl 109 (H) 50.0 - 100.0 ug/mL  Glucose, capillary     Status: None    Collection Time: 12/11/15  7:52 AM  Result Value Ref Range   Glucose-Capillary 94 65 - 99 mg/dL    Blood Alcohol level:  Lab Results  Component Value Date   ETH <5 12/03/2015   ETH <5 11/20/2015    Musculoskeletal: Strength & Muscle Tone: within normal limits Gait & Station: normal Patient leans: N/A  Psychiatric Specialty Exam: Review of Systems  Constitutional: Negative.   HENT: Negative.   Eyes: Negative.   Respiratory: Negative.   Cardiovascular: Negative.   Gastrointestinal: Positive for constipation. Negative for nausea, vomiting, abdominal pain and diarrhea.  Genitourinary: Negative.   Musculoskeletal: Negative.   Skin: Negative.   Neurological: Negative.  Negative for dizziness.  Endo/Heme/Allergies: Negative.   Psychiatric/Behavioral: Positive for memory loss. Negative for depression, suicidal ideas, hallucinations and substance abuse. The patient is not nervous/anxious and does not have insomnia.   All other systems reviewed and are negative.   Blood pressure 145/94, pulse 84, temperature 98.6 F (37 C), temperature source Oral, resp. rate 18, height 5\' 6"  (1.676 m), weight 89.132 kg (196 lb 8 oz), SpO2 97 %.Body mass index is 31.73 kg/(m^2).  General Appearance: Casual  Eye Contact::  Good  Speech:  Clear and Coherent  Volume:  Normal  Mood:  Euphoric  Affect:  Appropriate  Thought Process:  Tangential  Orientation:  Full (Time, Place, and Person)  Thought Content:  WDL  Suicidal Thoughts:  No  Homicidal Thoughts:  No  Memory:  Immediate;   Fair Recent;   Poor Remote;   Poor  Judgement:  Impaired  Insight:  Shallow  Psychomotor Activity:  Increased  Concentration:  Poor  Recall:  [place]  Language: Fair  Akathisia:  No  Handed:  Right  AIMS (if indicated):     Assets:  Communication Skills Desire for Improvement Housing  ADL's:  Intact  Cognition: WNL  Sleep:  Number of Hours: 6.75   Treatment Plan Summary: Daily  contact with patient to assess and evaluate symptoms and progress in treatment and Medication management   For bipolar disorder: continue olanzapine 30 mg po q day. Continue depakote but will changed ER to DR as it is less expensive. Also will decrease dose as depakote level was elevated. Depakote DR 500 mg bid.  Level obtained on 5/8  was >109.  Ammonia was elevated at 40.   Still manic (pressure speech, hyperverbal, psychomotor agitation, insomnia, tangential thought process, euphoric mood). Intrusive in groups , per staff no much change since admission  There are many risks associated with treating her with Depakote as she suffers from a steatosis, and hepatitis C. Depakote level and LFTs will have to be followed up closely. She is not a candidate for other mood stabilizers such as lithium as she suffers from a stage III kidney failure and she is not a candidate for Tegretol as she on a multitude of other medications that can have interactions with Tegretol and Tegretol has same effects on liver as Depakote.  Depakote induced hyperammonemia: will order lactulose 15 ml daily  For insomnia: continue using CPAP  Diabetes: will decrease metformin to 500 mg as Cr Cl is low 39. CBG will be d/c as CBG has been mostly nl.  Hypertension: continue atenolol but will increase to 25 mg q day  Dyslipidemia the patient will be continued on Pravachol 20 mg a day  Constipation the patient will be continued on Colace 200 mg by mouth twice a day and  Lactulose.  OSA: pt not sleeping well w/o CPAP. Will order CPAP qhs  Falls: Patient does not appear to having issues with her gait however per history she has recurrence falls at home. I will place the patient on fall precautions. No issues with gait here  Diet heart healthy and carb modified  Precautions fall and every 15 minute checks  Hospitalization and status continue voluntary admission  Disposition she will return to her sister's house once  stable  Discharge follow-up she'll continue to follow-up with Dr. Gretel Acre in our clinic.  Labs HbA1c 6.9, TSH wnl , lipid panel (low HDL) and prolactin mildly elevated  Contacted [facility]: they can deliver her medications for $5 and could prepare her meds in bubble packs for $1. Pt worries about her ability to afford medications.    [name], MD 12/11/2015, 2:05 PM

## 2015-12-11 NOTE — Progress Notes (Signed)
Recreation Therapy Notes  Date: 05.09.17 Time: 9:30 am Location: Craft Room  Group Topic: Coping skills  Goal Area(s) Addresses:  Patient will effectively use art as a coping skill. Patient will be able to identify one emotion experienced during group.  Behavioral Response: Attentive  Intervention: Two Faces of Me  Activity: Patients were given a blank face worksheet and instructed to draw or write how they felt when they were admitted to the hospital on one side of the face and draw or write how they wanted to feel when they are d/c on the other side of the face.  Education: LRT educated patients on healthy coping skills.  Education Outcome: In group clarification offered   Clinical Observations/Feedback: Patient drew how she felt when she was admitted to the hospital and how she wanted to feel when she was d/c. Patient did not contribute to group discussion. Patient said Lord's prayer at the end of group.  Leonette Monarch, LRT/CTRS 12/11/2015 10:27 AM

## 2015-12-11 NOTE — BHH Group Notes (Signed)
[facility] LCSW Aftercare Discharge Planning Group Note   12/11/2015 11:47 AM  Participation Quality:  Patient attended and participated in group discussion introducing herself and sharing that her SMART goal is to "pray for peace and to get well". Patient was given a daily workbook on Recovery and ensured patient was informed of LOS, rules for milieu, roles of staff, and ensured patient was aware of their psychiatrist and social worker names.   Mood/Affect:  Labile  Depression Rating:  5  Anxiety Rating:  Patient could not quantify but states anxiety is not an issue for her  Thoughts of Suicide:  No Will you contract for safety?   NA  Current AVH:  No  Plan for Discharge/Comments:  Home with sister  Transportation Means: will need assistance at discharge  Supports: sister is her support  Keene Breath, MSW, LCSW

## 2015-12-11 NOTE — Plan of Care (Signed)
Problem: Alteration in mood Goal: STG-Patient is able to sleep at least 6 hours per night Outcome: Progressing Patient slept 6.75 previous night

## 2015-12-11 NOTE — Progress Notes (Signed)
D: Patient appears oversedated. She has been in bed since shift change and has not come out of her room. She denies SI/HI/AVH. She denies pain. CPAP applied.  A: No medication given this evening. Encouragement provided.  R: Patient was compliant with CPAP. She has remained calm and cooperative. Safety maintained with 15 min checks.

## 2015-12-11 NOTE — BHH Group Notes (Signed)
[place] Group Notes:  (Nursing/MHT/Case Management/Adjunct)  Date:  12/11/2015  Time:  2:30 PM  Type of Therapy:  Psychoeducational Skills  Participation Level:  Active  Participation Quality:  Intrusive  Affect:  Appropriate and Blunted  Cognitive:  Oriented  Insight:  Appropriate  Engagement in Group:  Distracting, Engaged and Improving  Modes of Intervention:  Discussion and Education  Summary of Progress/Problems:  Gina [name] 12/11/2015, 2:30 PM

## 2015-12-11 NOTE — BHH Group Notes (Signed)
[place] LCSW Group Therapy  12/11/2015 4:09 PM  Type of Therapy:  Group Therapy  Participation Level:  Active  Participation Quality:  Attentive, Monopolizing and Redirectable  Affect:  Excited and Labile  Cognitive:  Disorganized  Insight:  Limited  Engagement in Therapy:  Monopolizing  Modes of Intervention:  Limit-setting and Socialization  Summary of Progress/Problems: Patient attended and participated in group discussion. Patient introduced herself and shared during an introductory exercise game "Two Truths and a Lie". Patient was hyper-verbal and monopolizing during group but redirectable. Patient shared that her sister is a big support for her but that her medications just do not work anymore since her doctor changed them and wants find a new balance in medications. Patient was positive and encouraging to other group members offering support.    Keene Breath, MSW, LCSW 12/11/2015, 4:09 PM

## 2015-12-11 NOTE — Progress Notes (Signed)
D:  Per pt self inventory pt reports sleeping fair, appetite poor, energy level normal, ability to pay attention poor, rates depression at a 5 out of 10, hopelessness at a 0 out of 10, anxiety at an 8 out of 10, denies SI/HI/AVH, goal today: "get healthy, pray", hyper religious, hyper-verbal, tangential at times, pleasant during interaction.    A:  Emotional support provided, Encouraged pt to continue with treatment plan and attend all group activities, q15 min checks maintained for safety.  R:  Pt is receptive, going to groups, pleasant and cooperative with staff and other patients on the unit.

## 2015-12-12 LAB — GLUCOSE, CAPILLARY: Glucose-Capillary: 81 mg/dL (ref 65–99)

## 2015-12-12 MED ORDER — HALOPERIDOL 0.5 MG PO TABS
1.0000 mg | ORAL_TABLET | Freq: Three times a day (TID) | ORAL | Status: DC
  Administered 2015-12-12 – 2015-12-14 (×7): 1 mg via ORAL
  Filled 2015-12-12 (×8): qty 2

## 2015-12-12 MED ORDER — LACTULOSE 10 GM/15ML PO SOLN
10.0000 g | ORAL | Status: DC
  Administered 2015-12-14 – 2015-12-18 (×3): 10 g via ORAL
  Filled 2015-12-12 (×3): qty 30

## 2015-12-12 MED ORDER — DIPHENHYDRAMINE HCL 25 MG PO CAPS
50.0000 mg | ORAL_CAPSULE | Freq: Once | ORAL | Status: AC
  Administered 2015-12-12: 50 mg via ORAL
  Filled 2015-12-12: qty 2

## 2015-12-12 NOTE — Plan of Care (Signed)
Problem: Ineffective individual coping Goal: LTG: Patient will report a decrease in negative feelings Outcome: Progressing Patient reports that her mood is a little better today than it was yesterday

## 2015-12-12 NOTE — BHH Group Notes (Signed)
[place] Group Notes:  (Nursing/MHT/Case Management/Adjunct)  Date:  12/12/2015  Time:  3:48 PM  Type of Therapy:  Psychoeducational Skills  Participation Level:  Active  Participation Quality:  Attentive, Sharing and Supportive  Affect:  Appropriate  Cognitive:  Oriented  Insight:  Improving  Engagement in Group:  Improving and Limited  Modes of Intervention:  Discussion and Education  Summary of Progress/Problems:  Charise Killian 12/12/2015, 3:48 PM

## 2015-12-12 NOTE — BHH Group Notes (Signed)
[place] LCSW Group Therapy  12/12/2015 2:34 PM  Type of Therapy:  Group Therapy   Summary of Progress/Problems:  Patient attended and participated in group and was attentive during the movie shown "The Happy Movie" which is a documentary about what truly makes people happy and how to measure happiness. Patient was attentive throughout the group session and introduced herself sharing that from the movie she realized that "happiness in different countries is different". Patient was disorganized but redirectable and able to explain that she liked to watch videos and felt that she learned more from documentaries than talking.   Keene Breath, MSW, LCSW 12/12/2015, 2:34 PM

## 2015-12-12 NOTE — BHH Group Notes (Signed)
[place] LCSW Group Therapy  12/12/2015 10:59 AM  Type of Therapy:  Group Therapy  Participation Level:  Active  Participation Quality:  Intrusive, Monopolizing and Redirectable  Affect:  Labile  Cognitive:  Disorganized  Insight:  Lacking  Engagement in Therapy:  Engaged  Modes of Intervention:  Discussion, Socialization and Support  Summary of Progress/Problems: Patient attended and participated in group discussion introducing herself and sharing during an introductory exercise that her Dream Vacation would be to "the [place] and see the Pope who can heal me". Patient was intrusive at times and monopolizing but redirectable. Patient shared that her life is out of Balance due to not being on a stable dose of her medications. Patient received support from other group members that also have experienced changes in their lives. Patient read "the Lord's Prayer" with permission from other group members and discussed how her faith and spirituality is important to her mental health.  Keene Breath, MSW, LCSW 12/12/2015, 10:59 AM

## 2015-12-12 NOTE — Progress Notes (Signed)
[place] MD Progress Note  12/12/2015 1:49 PM Gina [name]  MRN:  [id number]  Subjective:  Gina [name] is very tangential. She complains of multiple things, she is constipated today, she wants to get a mammogram while she is here to make sure she does not have cancer, she worries about her "sugar diabetes". Attending groups buy hyper religious and intrusive.  Slept better last night. Denies having SE. Same presentation today and same complaints.    Per nursing staff pt went to bead at 7 pm and then woke up at 12 am.  Benadryl given then w/o benefit  Per Nursing:  Patient appears oversedated. She has been in bed since shift change and has not come out of her room. She denies SI/HI/AVH. She denies pain. CPAP applied.    Principal Problem: Bipolar disorder, manic, moderate ([place]) Diagnosis:   Patient Active Problem List   Diagnosis Date Noted  . Bipolar disorder, manic, moderate ([place]) [F31.12] 12/03/2015  . NASH (nonalcoholic steatohepatitis) [K75.81] 03/28/2015  . Gout [M10.9] 03/27/2015  . HCV antibody positive [R89.4] 03/27/2015  . Memory deficit [R41.3] 09/13/2014  . Uncontrolled type 2 diabetes mellitus with nephropathy ([place]) [E11.21, E11.65] 06/07/2014  . Right knee pain [M25.561] 03/16/2014  . Hyperlipidemia [E78.5]   . IDA (iron deficiency anemia) [D50.9] 01/26/2013  . History of colonic polyps [Z86.010] 11/11/2012  . Recurrent falls [R29.6] 10/02/2012  . Urine incontinence [R32] 08/27/2012  . Recurrent ventral hernia [K43.2] 08/11/2012  . OSA on CPAP [G47.33]   . Intertrigo [L30.4] 05/27/2012  . CKD (chronic kidney disease) stage 3, GFR 30-59 ml/min [N18.3]   . Obesity, Class II, BMI 35-39.9, with comorbidity ([place]) [E66.01]   . Colon cancer ([place]) [C18.9] 04/07/2012  . Enterocutaneous fistula [K63.2] 04/07/2012  . HTN (hypertension), benign [I10] 04/07/2012   Total Time spent with patient: 30 minutes  Past Psychiatric History: Bipolar disorder.  Past Medical History:   Past Medical History  Diagnosis Date  . Bipolar depression [facility])     sees psychiatrist - psych admission 03/2015  . DJD (degenerative joint disease), lumbar     chronic lower back pain  . Obesity (BMI 30-39.9)   . History of colon cancer     s/p surgery  . 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 OSA on CPAP     6cm H2O  . RBBB   . Anemia in chronic kidney disease   . Blood transfusion without reported diagnosis 2009  . Cataract     left  . GERD (gastroesophageal reflux disease)   . Hyperlipidemia   . Hypertension   . History of bladder cancer 1997  . History of uterine cancer     s/p hysterectomy  . CKD (chronic kidney disease) stage 3, GFR 30-59 ml/min   . Personal history of colonic adenomas and colon cancer 11/11/2012  . IDA (iron deficiency anemia) 01/2013    thought due to h/o polyps  . Positive hepatitis C antibody test 09/2014    but negative confirmatory testing  . Colon cancer ([place]) 1990's  . Family history of breast cancer   . Family history of colon cancer   . Family history of stomach cancer   . Uncontrolled type 2 diabetes mellitus with nephropathy ([place]) 06/07/2014    [facility] 11/2015    Past Surgical History  Procedure Laterality Date  . Hernia repair  02/04/12  . Partial colectomy  about 2008    for colon cancer  . I &  d abdominal wound  02/19/12  . Removal of infected mesh and abdominal wound vac placement  02/24/12  . Reexploration of abdominal wound and allograft placemet  02/26/12  . Partial hysterectomy  1981    uterine cancer, R ovary remains  . Left oophorectomy  2005  . Colonoscopy  07/2009  . Dexa  12/2009    WNL  . Dobutamine stress echo  12/2009    no evidence of ischemia  . Colonoscopy  11/2012    2 tubular adenomas, mild diverticulosis, pending genetic testing for Lynch syndrome [name]) rpt 2 yrs  . Sleep study  02/2014    OSA - AHI 55, nadir 81% [name])  . Breast biopsy Right 01/2014    fibroadenoma  .  Colonoscopy  02/2015    TA, diverticulosis, rpt 2 yrs [name])   Family History:  Family History  Problem Relation Age of Onset  . Colon cancer Mother 26  . Stomach cancer Mother     dx in her 15s?  Marland Kitchen Colon cancer Sister 71    Maternal half sister  . CAD Father     MI  . Diabetes Other     aunts and uncles both sides  . Arthritis Other     strong fmhx  . Mental illness Sister     anxiety/depression  . Breast cancer Maternal Grandmother   . Esophageal cancer Neg Hx   . Rectal cancer Neg Hx    Family Psychiatric  History: See H&P. Social History:  History  Alcohol Use No     History  Drug Use No    Social History   Social History  . Marital Status: Single    Spouse Name: N/A  . Number of Children: 0  . Years of Education: N/A   Social History Main Topics  . Smoking status: Never Smoker   . Smokeless tobacco: Never Used  . Alcohol Use: No  . Drug Use: No  . Sexual Activity: No   Other Topics Concern  . None   Social History Narrative   Lives with sister, no pets   Occupation: disabled, for bipolar and arthritis   Edu: GED   Activity: take walks   Diet: good water, vegetables daily   Religion: Catholic      Psych - [employer], Dr. Jimmye Norman (ph [phone])   Sleep: Fair  Appetite:  Fair  Current Medications: Current Facility-Administered Medications  Medication Dose Route Frequency Provider Last Rate Last Dose  . acetaminophen (TYLENOL) tablet 650 mg  650 mg Oral Q6H PRN [name], MD   650 mg at 12/05/15 1253  . alum & mag hydroxide-simeth (MAALOX/MYLANTA) 200-200-20 MG/5ML suspension 30 mL  30 mL Oral Q4H PRN [name], MD   30 mL at 12/10/15 1608  . atenolol (TENORMIN) tablet 25 mg  25 mg Oral Q breakfast [name], MD   25 mg at 12/12/15 [id number]  . divalproex (DEPAKOTE) DR tablet 500 mg  500 mg Oral BID WC [name], MD   500 mg at 12/12/15 0823  . docusate sodium (COLACE) capsule 200 mg  200 mg  Oral BID WC [name], MD   200 mg at 12/12/15 0823  . haloperidol (HALDOL) tablet 1 mg  1 mg Oral TID WC [name], MD   1 mg at 12/12/15 1150  . lactulose (CHRONULAC) 10 GM/15ML solution 10 g  10 g Oral Q breakfast [name], MD   10 g at 12/12/15 [id number]  .  magnesium hydroxide (MILK OF MAGNESIA) suspension 30 mL  30 mL Oral Daily PRN [name], MD   30 mL at 12/07/15 1445  . metFORMIN (GLUCOPHAGE) tablet 500 mg  500 mg Oral Q breakfast [name], MD   500 mg at 12/12/15 [id number]  . OLANZapine (ZYPREXA) tablet 30 mg  30 mg Oral QPC supper [name], MD   30 mg at 12/11/15 1636  . pravastatin (PRAVACHOL) tablet 20 mg  20 mg Oral q1800 [name], MD   20 mg at 12/11/15 1636  . white petrolatum (VASELINE) gel   Topical PRN [name], MD        Lab Results:  Results for orders placed or performed during the hospital encounter of 12/03/15 (from the past 48 hour(s))  Ammonia     Status: Abnormal   Collection Time: 12/10/15  5:00 PM  Result Value Ref Range   Ammonia 40 (H) 9 - 35 umol/L  Hepatic function panel     Status: Abnormal   Collection Time: 12/10/15  5:00 PM  Result Value Ref Range   Total Protein 7.0 6.5 - 8.1 g/dL   Albumin 4.1 3.5 - 5.0 g/dL   AST 29 15 - 41 U/L   ALT 25 14 - 54 U/L   Alkaline Phosphatase 107 38 - 126 U/L   Total Bilirubin 0.5 0.3 - 1.2 mg/dL   Bilirubin, Direct <0.1 (L) 0.1 - 0.5 mg/dL   Indirect Bilirubin NOT CALCULATED 0.3 - 0.9 mg/dL  Valproic acid level     Status: Abnormal   Collection Time: 12/10/15  5:00 PM  Result Value Ref Range   Valproic Acid Lvl 109 (H) 50.0 - 100.0 ug/mL  Glucose, capillary     Status: None   Collection Time: 12/11/15  7:52 AM  Result Value Ref Range   Glucose-Capillary 94 65 - 99 mg/dL  Glucose, capillary     Status: None   Collection Time: 12/11/15  4:35 PM  Result Value Ref Range   Glucose-Capillary 75 65 - 99 mg/dL   Glucose, capillary     Status: None   Collection Time: 12/12/15  6:40 AM  Result Value Ref Range   Glucose-Capillary 81 65 - 99 mg/dL    Blood Alcohol level:  Lab Results  Component Value Date   ETH <5 12/03/2015   ETH <5 11/20/2015    Musculoskeletal: Strength & Muscle Tone: within normal limits Gait & Station: normal Patient leans: N/A  Psychiatric Specialty Exam: Review of Systems  Constitutional: Negative.   HENT: Negative.   Eyes: Negative.   Respiratory: Negative.   Cardiovascular: Negative.   Gastrointestinal: Positive for constipation. Negative for nausea, vomiting, abdominal pain and diarrhea.  Genitourinary: Negative.   Musculoskeletal: Negative.   Skin: Negative.   Neurological: Negative.  Negative for dizziness.  Endo/Heme/Allergies: Negative.   Psychiatric/Behavioral: Positive for memory loss. Negative for depression, suicidal ideas, hallucinations and substance abuse. The patient is not nervous/anxious and does not have insomnia.   All other systems reviewed and are negative.   Blood pressure 144/62, pulse 66, temperature 98 F (36.7 C), temperature source Oral, resp. rate 18, height 5\' 6"  (1.676 m), weight 89.132 kg (196 lb 8 oz), SpO2 97 %.Body mass index is 31.73 kg/(m^2).  General Appearance: Casual  Eye Contact::  Good  Speech:  Clear and Coherent  Volume:  Normal  Mood:  Euphoric  Affect:  Appropriate  Thought Process:  Tangential  Orientation:  Full (Time, Place, and Person)  Thought Content:  WDL  Suicidal Thoughts:  No  Homicidal Thoughts:  No  Memory:  Immediate;   Fair Recent;   Poor Remote;   Poor  Judgement:  Impaired  Insight:  Shallow  Psychomotor Activity:  Increased  Concentration:  Poor  Recall:  [place]  Language: Fair  Akathisia:  No  Handed:  Right  AIMS (if indicated):     Assets:  Communication Skills Desire for Improvement Housing  ADL's:  Intact  Cognition: WNL  Sleep:  Number of Hours: 4.75    Treatment Plan Summary: Daily contact with patient to assess and evaluate symptoms and progress in treatment and Medication management   For bipolar disorder: continue olanzapine 30 mg po q day. Continue depakote DR 500 mg po bid.   Level obtained on 5/8  was >109.  Ammonia was elevated at 40.   Still manic (pressure speech, hyperverbal, psychomotor agitation, insomnia, tangential thought process, euphoric mood). Intrusive in groups , per staff no much change since admission.  Will add haldol 1 mg tid  There are many risks associated with treating her with Depakote as she suffers from a steatosis, and hepatitis C. Depakote level and LFTs will have to be followed up closely. She is not a candidate for other mood stabilizers such as lithium as she suffers from a stage III kidney failure and she is not a candidate for Tegretol as she on a multitude of other medications that can have interactions with Tegretol and Tegretol has same effects on liver as Depakote.  Depakote induced hyperammonemia: will order lactulose 15 ml daily---will recheck ammonia and depakote level on Monday  For insomnia: continue using CPAP  Diabetes: will decrease metformin to 500 mg as Cr Cl is low 39. CBG will be d/c as CBG has been mostly nl.  Hypertension: continue atenolol  25 mg q day  Dyslipidemia the patient will be continued on Pravachol 20 mg a day  Constipation the patient will be continued on Colace 200 mg by mouth twice a day and  Lactulose.  OSA: pt not sleeping well w/o CPAP. Will order CPAP qhs  Falls: Patient does not appear to having issues with her gait however per history she has recurrence falls at home. I will place the patient on fall precautions. No issues with gait here  Diet heart healthy and carb modified  Precautions fall and every 15 minute checks  Hospitalization and status continue voluntary admission  Disposition she will return to her sister's house once stable  Discharge  follow-up she'll continue to follow-up with Dr. Gretel Acre in our clinic.  SW has made a referral for [facility]---pending answer from ACT to seeif they will take her case.  Labs HbA1c 6.9, TSH wnl , lipid panel (low HDL) and prolactin mildly elevated  Contacted [facility]: they can deliver her medications for $5 and could prepare her meds in bubble packs for $1. Pt worries about her ability to afford medications.    [name], MD 12/12/2015, 1:49 PM

## 2015-12-12 NOTE — Progress Notes (Signed)
D:  Patient is alert and oriented on the unit this shift.  Patient attended and actively participated in groups today.  Patient denies suicidal ideation, homicidal ideation, auditory or visual hallucinations at the present time.  Patient states that her mood is slightly improved this shift. A:  Scheduled medications are administered to patient as per MD orders.  Emotional support and encouragement are provided.  Patient is maintained on q.15 minute safety checks.  Patient is informed to notify staff with questions or concerns. R:  No adverse medication reactions are noted.  Patient is cooperative with medication administration and treatment plan today.  Patient is receptive, calm and cooperative on the unit at this time.  Patient interacts well with others on the unit this shift.  Patient contracts for safety at this time.  Patient remains safe at this time.

## 2015-12-12 NOTE — Tx Team (Signed)
Interdisciplinary Treatment Plan Update (Adult)  Date:  12/12/2015 Time Reviewed:  5:43 PM  Progress in Treatment: Attending groups: Yes. Participating in groups: Yes. Taking medication as prescribed: Yes. Tolerating medication: Yes. Family/Significant othe contact made: No, will contact: sister Patient understands diagnosis: Yes. Discussing patient identified problems/goals with staff: Yes. Medical problems stabilized or resolved: Yes. Denies suicidal/homicidal ideation: Yes. Issues/concerns per patient self-inventory: Yes. Other:  New problem(s) identified: No, Describe: NA  Discharge Plan or Barriers: Pt plans to return home.  Referral made to ACT team   Reason for Continuation of Hospitalization: Mania Medication stabilization  Comments:Ms. [place] is very tangential. She complains of multiple things, she is constipated today, she wants to get a mammogram while she is here to make sure she does not have cancer, she worries about her "sugar diabetes". Attending groups buy hyper religious and intrusive. Slept better last night. Denies having SE. Same presentation today and same complaints.   Per nursing staff pt went to bead at 7 pm and then woke up at 12 am. Benadryl given then w/o benefit  Estimated length of stay: 5-7 days   New goal(s): NA  Review of initial/current patient goals per problem list:  1. Goal(s): Patient will participate in aftercare plan  Met:  Target date: at discharge  As evidenced by: Patient will participate within aftercare plan AEB aftercare provider and housing plan at discharge being identified. 5/10-Goal progressing  2. Goal (s): Patient will demonstrate decreased symptoms of mania.  Met:No  Target date: at discharge  As evidenced by: Patient will not endorse signs of mania or be deemed stable for discharge by MD. 5/10-Goal progressing  Attendees: Patient:  Gina [name] 5/10/20175:43 PM  Family:    5/10/20175:43 PM  Physician:  Hernandez 5/10/20175:43 PM  Nursing:   Polly Cobia, RN 5/10/20175:43 PM  Case Manager:   5/10/20175:43 PM  Counselor:  Dossie Arbour, LCSW 5/10/20175:43 PM  Other:   5/10/20175:43 PM  Other:   5/10/20175:43 PM  Other:   5/10/20175:43 PM  Other:  5/10/20175:43 PM  Other:  5/10/20175:43 PM  Other:  5/10/20175:43 PM  Other:  5/10/20175:43 PM  Other:  5/10/20175:43 PM  Other:  5/10/20175:43 PM  Other:   5/10/20175:43 PM   Scribe for Treatment Team:   August Saucer, 12/12/2015, 5:43 PM, MSW, LCSW

## 2015-12-12 NOTE — Plan of Care (Signed)
Problem: Ineffective individual coping Goal: STG-Increase in ability to manage activities of daily living Outcome: Progressing Patient is able to manage her activities of daily living

## 2015-12-13 LAB — GLUCOSE, CAPILLARY: GLUCOSE-CAPILLARY: 97 mg/dL (ref 65–99)

## 2015-12-13 NOTE — Plan of Care (Signed)
Problem: Alteration in mood Goal: LTG-Patient's behavior demonstrates decreased manic symptoms (Patient's behavior demonstrates decreased manic symptoms to the point the patient is safe to return home and continue treatment in an outpatient setting)  Outcome: Progressing Less manic     

## 2015-12-13 NOTE — Progress Notes (Signed)
[facility] MD Progress Note  12/13/2015 12:56 PM Gina [name]  MRN:  [id number]  Subjective:  Gina [name] is very tangential. She complains of multiple things, she is constipated today, she wants to get a mammogram while she is here to make sure she does not have cancer, she worries about her "sugar diabetes". Attending groups buy hyper religious and intrusive.  Slept better last night. Denies having SE. Same presentation today and same complaints.  Significant perseveration as she ask the same questions every day.  Per nursing staff pt slept well last night.   Per Nursing: less manic  Principal Problem: Bipolar disorder, manic, moderate ([place]) Diagnosis:   Patient Active Problem List   Diagnosis Date Noted  . Bipolar disorder, manic, moderate ([place]) [F31.12] 12/03/2015  . NASH (nonalcoholic steatohepatitis) [K75.81] 03/28/2015  . Gout [M10.9] 03/27/2015  . HCV antibody positive [R89.4] 03/27/2015  . Memory deficit [R41.3] 09/13/2014  . Uncontrolled type 2 diabetes mellitus with nephropathy ([place]) [E11.21, E11.65] 06/07/2014  . Right knee pain [M25.561] 03/16/2014  . Hyperlipidemia [E78.5]   . IDA (iron deficiency anemia) [D50.9] 01/26/2013  . History of colonic polyps [Z86.010] 11/11/2012  . Recurrent falls [R29.6] 10/02/2012  . Urine incontinence [R32] 08/27/2012  . Recurrent ventral hernia [K43.2] 08/11/2012  . OSA on CPAP [G47.33]   . Intertrigo [L30.4] 05/27/2012  . CKD (chronic kidney disease) stage 3, GFR 30-59 ml/min [N18.3]   . Obesity, Class II, BMI 35-39.9, with comorbidity ([place]) [E66.01]   . Colon cancer ([place]) [C18.9] 04/07/2012  . Enterocutaneous fistula [K63.2] 04/07/2012  . HTN (hypertension), benign [I10] 04/07/2012   Total Time spent with patient: 30 minutes  Past Psychiatric History: Bipolar disorder.  Past Medical History:  Past Medical History  Diagnosis Date  . Bipolar depression [facility])     sees psychiatrist - psych admission 03/2015  . DJD (degenerative  joint disease), lumbar     chronic lower back pain  . Obesity (BMI 30-39.9)   . History of colon cancer     s/p surgery  . 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 OSA on CPAP     6cm H2O  . RBBB   . Anemia in chronic kidney disease   . Blood transfusion without reported diagnosis 2009  . Cataract     left  . GERD (gastroesophageal reflux disease)   . Hyperlipidemia   . Hypertension   . History of bladder cancer 1997  . History of uterine cancer     s/p hysterectomy  . CKD (chronic kidney disease) stage 3, GFR 30-59 ml/min   . Personal history of colonic adenomas and colon cancer 11/11/2012  . IDA (iron deficiency anemia) 01/2013    thought due to h/o polyps  . Positive hepatitis C antibody test 09/2014    but negative confirmatory testing  . Colon cancer ([place]) 1990's  . Family history of breast cancer   . Family history of colon cancer   . Family history of stomach cancer   . Uncontrolled type 2 diabetes mellitus with nephropathy ([place]) 06/07/2014    [facility] DSME 11/2015    Past Surgical History  Procedure Laterality Date  . Hernia repair  02/04/12  . Partial colectomy  about 2008    for colon cancer  . I & d abdominal wound  02/19/12  . Removal of infected mesh and abdominal wound vac placement  02/24/12  . Reexploration of abdominal wound and allograft placemet  02/26/12  . Partial hysterectomy  1981    uterine cancer, R ovary remains  . Left oophorectomy  2005  . Colonoscopy  07/2009  . Dexa  12/2009    WNL  . Dobutamine stress echo  12/2009    no evidence of ischemia  . Colonoscopy  11/2012    2 tubular adenomas, mild diverticulosis, pending genetic testing for Lynch syndrome [name]) rpt 2 yrs  . Sleep study  02/2014    OSA - AHI 55, nadir 81% [name])  . Breast biopsy Right 01/2014    fibroadenoma  . Colonoscopy  02/2015    TA, diverticulosis, rpt 2 yrs [name])   Family History:  Family History  Problem Relation Age of Onset  . Colon  cancer Mother 10  . Stomach cancer Mother     dx in her 70s?  Marland Kitchen Colon cancer Sister 73    Maternal half sister  . CAD Father     MI  . Diabetes Other     aunts and uncles both sides  . Arthritis Other     strong fmhx  . Mental illness Sister     anxiety/depression  . Breast cancer Maternal Grandmother   . Esophageal cancer Neg Hx   . Rectal cancer Neg Hx    Family Psychiatric  History: See H&P. Social History:  History  Alcohol Use No     History  Drug Use No    Social History   Social History  . Marital Status: Single    Spouse Name: N/A  . Number of Children: 0  . Years of Education: N/A   Social History Main Topics  . Smoking status: Never Smoker   . Smokeless tobacco: Never Used  . Alcohol Use: No  . Drug Use: No  . Sexual Activity: No   Other Topics Concern  . None   Social History Narrative   Lives with sister, no pets   Occupation: disabled, for bipolar and arthritis   Edu: GED   Activity: take walks   Diet: good water, vegetables daily   Religion: Catholic      Psych - [employer], Dr. Jimmye Norman (ph [phone])    Current Medications: Current Facility-Administered Medications  Medication Dose Route Frequency Provider Last Rate Last Dose  . acetaminophen (TYLENOL) tablet 650 mg  650 mg Oral Q6H PRN [name], MD   650 mg at 12/05/15 1253  . alum & mag hydroxide-simeth (MAALOX/MYLANTA) 200-200-20 MG/5ML suspension 30 mL  30 mL Oral Q4H PRN [name], MD   30 mL at 12/10/15 1608  . atenolol (TENORMIN) tablet 25 mg  25 mg Oral Q breakfast [name], MD   25 mg at 12/13/15 [id number]  . divalproex (DEPAKOTE) DR tablet 500 mg  500 mg Oral BID WC [name], MD   500 mg at 12/13/15 0811  . docusate sodium (COLACE) capsule 200 mg  200 mg Oral BID WC [name], MD   200 mg at 12/13/15 0811  . haloperidol (HALDOL) tablet 1 mg  1 mg Oral TID WC [name], MD   1 mg at 12/13/15  1203  . [START ON 12/14/2015] lactulose (CHRONULAC) 10 GM/15ML solution 10 g  10 g Oral QODAY [name], MD      . magnesium hydroxide (MILK OF MAGNESIA) suspension 30 mL  30 mL Oral Daily PRN [name], MD   30 mL at 12/07/15 1445  . metFORMIN (GLUCOPHAGE) tablet 500 mg  500 mg Oral Q breakfast Seth Bake  Hernandez-Gonzalez, MD   500 mg at 12/13/15 0811  . OLANZapine (ZYPREXA) tablet 30 mg  30 mg Oral QPC supper [name], MD   30 mg at 12/12/15 1708  . pravastatin (PRAVACHOL) tablet 20 mg  20 mg Oral q1800 [name], MD   20 mg at 12/12/15 1708  . white petrolatum (VASELINE) gel   Topical PRN [name], MD        Lab Results:  Results for orders placed or performed during the hospital encounter of 12/03/15 (from the past 48 hour(s))  Glucose, capillary     Status: None   Collection Time: 12/11/15  4:35 PM  Result Value Ref Range   Glucose-Capillary 75 65 - 99 mg/dL  Glucose, capillary     Status: None   Collection Time: 12/12/15  6:40 AM  Result Value Ref Range   Glucose-Capillary 81 65 - 99 mg/dL  Glucose, capillary     Status: None   Collection Time: 12/13/15  6:56 AM  Result Value Ref Range   Glucose-Capillary 97 65 - 99 mg/dL   Comment 1 Notify RN     Blood Alcohol level:  Lab Results  Component Value Date   ETH <5 12/03/2015   ETH <5 11/20/2015    Musculoskeletal: Strength & Muscle Tone: within normal limits Gait & Station: normal Patient leans: N/A  Psychiatric Specialty Exam: Review of Systems  Constitutional: Negative.   HENT: Negative.   Eyes: Negative.   Respiratory: Negative.   Cardiovascular: Negative.   Gastrointestinal: Positive for diarrhea. Negative for nausea, vomiting, abdominal pain and constipation.  Genitourinary: Negative.   Musculoskeletal: Negative.   Skin: Negative.   Neurological: Negative.  Negative for dizziness.  Endo/Heme/Allergies: Negative.   Psychiatric/Behavioral: Positive for  memory loss. Negative for depression, suicidal ideas, hallucinations and substance abuse. The patient is not nervous/anxious and does not have insomnia.   All other systems reviewed and are negative.   Blood pressure 140/64, pulse 63, temperature 98.3 F (36.8 C), temperature source Oral, resp. rate 18, height 5\' 6"  (1.676 m), weight 89.132 kg (196 lb 8 oz), SpO2 97 %.Body mass index is 31.73 kg/(m^2).  General Appearance: Casual  Eye Contact::  Good  Speech:  Clear and Coherent  Volume:  Normal  Mood:  Euphoric  Affect:  Appropriate  Thought Process:  Tangential  Orientation:  Full (Time, Place, and Person)  Thought Content:  WDL  Suicidal Thoughts:  No  Homicidal Thoughts:  No  Memory:  Immediate;   Fair Recent;   Poor Remote;   Poor  Judgement:  Impaired  Insight:  Shallow  Psychomotor Activity:  Increased  Concentration:  Poor  Recall:  [place]  Language: Fair  Akathisia:  No  Handed:  Right  AIMS (if indicated):     Assets:  Communication Skills Desire for Improvement Housing  ADL's:  Intact  Cognition: WNL  Sleep:  Number of Hours: 6   Treatment Plan Summary: Daily contact with patient to assess and evaluate symptoms and progress in treatment and Medication management   For bipolar disorder: continue olanzapine 30 mg po q day. Continue depakote DR 500 mg po bid.   Level obtained on 5/8  was >109.  Ammonia was elevated at 40.   Still manic (pressure speech, hyperverbal, psychomotor agitation, insomnia, tangential thought process, euphoric mood). Intrusive in groups , per staff no much change since admission.  Pt has been started on haldol 1 mg tid.  There are many  risks associated with treating her with Depakote as she suffers from a steatosis, and hepatitis C. Depakote level and LFTs will have to be followed up closely. She is not a candidate for other mood stabilizers such as lithium as she suffers from a stage III kidney failure and she is not a  candidate for Tegretol as she on a multitude of other medications that can have interactions with Tegretol and Tegretol has same effects on liver as Depakote.  Depakote induced hyperammonemia: will order lactulose 15 ml every other day daily---will recheck ammonia and depakote level on Monday  For insomnia: continue using CPAP  Diabetes: will decrease metformin to 500 mg as Cr Cl is low 39. CBG will be d/c as CBG has been mostly nl.  Hypertension: continue atenolol  25 mg q day  Dyslipidemia the patient will be continued on Pravachol 20 mg a day  Constipation the patient will be continued on Colace 200 mg by mouth twice a day and  Lactulose.  OSA: pt not sleeping well w/o CPAP. Will order CPAP qhs  Falls: Patient does not appear to having issues with her gait however per history she has recurrence falls at home. I will place the patient on fall precautions. No issues with gait here  Diet heart healthy and carb modified  Precautions fall and every 15 minute checks  Hospitalization and status continue voluntary admission  Disposition she will return to her sister's house once stable.  SW made contact with the sister.  No issues returning there.  Sister confirms they both have poor reading skills and struggle to comply with meds.   Discharge follow-up she'll continue to follow-up with Dr. Gretel Acre in our clinic.  SW has made a referral for [facility]---pending answer from [facility] to seeif they will take her case.  Labs HbA1c 6.9, TSH wnl , lipid panel (low HDL) and prolactin mildly elevated--Will check depakote, ammonia and LFTs on Monday am  [facility]: they can deliver her medications for $5 and could prepare her meds in bubble packs for $1. Pt worries about her ability to afford medications.    [name], MD 12/13/2015, 12:56 PM

## 2015-12-13 NOTE — BHH Group Notes (Signed)
[place] LCSW Group Therapy  12/13/2015 10:58 AM  Type of Therapy:  Group Therapy  Participation Level:  Active  Participation Quality:  Monopolizing and Redirectable  Affect:  Labile  Cognitive:  Disorganized  Insight:  Lacking and Monopolizing  Engagement in Therapy:  Limited, Monopolizing and Off Topic  Modes of Intervention:  Socialization and Support  Summary of Progress/Problems: Patient attended and participated in group discussion introducing herself and participating in an introductory exercise sharing that if she had $1,000,000 she would "keep a little but give to the poor, buy a car, and pay a schoffer". Patient was hyper-verbal and monopolizing during group and had to be redirected several times. Patient was disorganized and not able to make much sense or focus on group topic but would get distracted from group discussion.  Keene Breath, MSW, LCSW 12/13/2015, 10:58 AM

## 2015-12-13 NOTE — BHH Group Notes (Signed)
[facility] LCSW Aftercare Discharge Planning Group Note   12/13/2015 10:36 AM  Participation Quality:  Patient attended and participated in group introducing herself and sharing that her SMART goal is to "get myself together before I leave". Patient was hyper-verbal and monopolizing and had to be redirected several times. Patient was given a daily workbook for Thursday Leisure Activities and is aware of her Tx Team staff to include Education officer, museum and psychiatrist.   Mood/Affect:  Hyper-verbal, monopolizing  Depression Rating:  5  Anxiety Rating:  6  Thoughts of Suicide:  No Will you contract for safety?   NA  Current AVH:  No  Plan for Discharge/Comments:  Discharge home with outpatient follow up.  Transportation Means: may need assistance  Supports: sister and mental health provider outpatient  Keene Breath, MSW, LCSW

## 2015-12-13 NOTE — Progress Notes (Signed)
D: Pt denies SI/HI/AVH. Pt is pleasant and cooperative, affect is flat and sad, but brightens upon approach. Pt  appears less anxious and she is interacting with peers and staff appropriately.  A: Pt was offered support and encouragement. Pt was given scheduled medications. Pt was encouraged to attend groups. Q 15 minute checks were done for safety.  R:Pt attends groups and interacts well with peers and staff. Pt is taking medication. Pt has no complaints.Pt receptive to treatment and safety maintained on unit.

## 2015-12-13 NOTE — Progress Notes (Signed)
Pt less manic, med and group compliant. Appropriate with staff and peers. No negative behaviors. Visible in mileu. Encouragement and support offered. Pt receptive and pleasant. Safety maintained on unit with q 15 min checks

## 2015-12-13 NOTE — Progress Notes (Signed)
Placed pt on [facility] CPAP C-3. Pt tolerating well. CPAP plugged into red outlet.

## 2015-12-13 NOTE — BHH Group Notes (Signed)
[place] Group Notes:  (Nursing/MHT/Case Management/Adjunct)  Date:  12/13/2015  Time:  4:00 PM  Type of Therapy:  Music Therapy  Participation Level:  Active  Participation Quality:  Appropriate and Attentive  Affect:  Appropriate  Cognitive:  Alert, Appropriate and Oriented  Insight:  Appropriate  Engagement in Group:  Engaged  Modes of Intervention:  Activity  Summary of Progress/Problems:  Gina [name] [name] 12/13/2015, 4:00 PM

## 2015-12-13 NOTE — BHH Group Notes (Signed)
[place] Group Notes:  (Nursing/MHT/Case Management/Adjunct)  Date:  12/13/2015  Time:  1:20 AM  Type of Therapy:  Group Therapy  Participation Level:  Active  Participation Quality:  Attentive  Affect:  Appropriate  Cognitive:  Disorganized  Insight:  Limited  Engagement in Group:  Engaged and Off Topic  Modes of Intervention:  Discussion  Summary of Progress/Problems: Pt's thoughts seemed disorganized. Staff was able to redirect pt back on topic. Pt stated that she will not leave the unit until she feels like she is better. She does not care what any doctor says.   Gina [name] Layce Sprung 12/13/2015, 1:20 AM

## 2015-12-13 NOTE — Plan of Care (Signed)
Problem: Alteration in mood Goal: LTG-Patient's behavior demonstrates decreased manic symptoms (Patient's behavior demonstrates decreased manic symptoms to the point the patient is safe to return home and continue treatment in an outpatient setting)  Outcome: Progressing No manic behavior noted

## 2015-12-13 NOTE — BHH Group Notes (Signed)
[place] LCSW Group Therapy  12/13/2015 2:22 PM  Type of Therapy:  Group Therapy  Participation Level:  Active  Summary of Progress/Problems: CSW showed a movie Tressie Ellis Saves His Family which depicts struggles that many of our group members have in their lives with dysfunctional family crisis such as a relative passing away, Stuart's father an alcoholic and his brother is smoking marijuana daily along with co dependent relationships and family conflict.   Patient attended group and was attentive throughout group and participated sharing after the video that she could relate to issues the family was having especially the conflict that arose during the funeral and family was not seeing the situation and solutions the same.  Keene Breath, MSW, LCSW 12/13/2015, 2:22 PM

## 2015-12-14 LAB — GLUCOSE, CAPILLARY: GLUCOSE-CAPILLARY: 97 mg/dL (ref 65–99)

## 2015-12-14 MED ORDER — HALOPERIDOL 2 MG PO TABS
2.0000 mg | ORAL_TABLET | Freq: Two times a day (BID) | ORAL | Status: DC
  Administered 2015-12-14 – 2015-12-17 (×6): 2 mg via ORAL
  Filled 2015-12-14 (×6): qty 1

## 2015-12-14 MED ORDER — DIPHENHYDRAMINE HCL 25 MG PO CAPS
25.0000 mg | ORAL_CAPSULE | Freq: Every day | ORAL | Status: DC
  Administered 2015-12-14 – 2015-12-18 (×5): 25 mg via ORAL
  Filled 2015-12-14 (×5): qty 1

## 2015-12-14 NOTE — BHH Group Notes (Signed)
[place] Group Notes:  (Nursing/MHT/Case Management/Adjunct)  Date:  12/14/2015  Time:  3:53 PM  Type of Therapy:  Psychoeducational Skills  Participation Level:  Active  Participation Quality:  Appropriate and Attentive  Affect:  Appropriate  Cognitive:  Appropriate  Insight:  Appropriate  Engagement in Group:  Engaged pt stated crossword puzzles helps her to stay focus, appreciated time outside for fresh air.   Modes of Intervention:  Discussion  Summary of Progress/Problems:  Drake Leach 12/14/2015, 3:53 PM

## 2015-12-14 NOTE — BHH Group Notes (Signed)
[place] LCSW Group Therapy  12/14/2015 11:36 AM  Type of Therapy:  Group Therapy  Participation Level:  Did Not Attend  Summary of Progress/Problems: Patient was called to group but did not attend.  Keene Breath, MSW, LCSW 12/14/2015, 11:36 AM

## 2015-12-14 NOTE — BHH Group Notes (Signed)
[place] LCSW Group Therapy  12/14/2015 2:57 PM  Type of Therapy:  Group Therapy  Participation Level:  Active  Participation Quality:  Intrusive, Monopolizing and Redirectable  Affect:  Labile  Cognitive:  Disorganized  Insight:  Limited  Engagement in Therapy:  Engaged  Modes of Intervention:  Activity and Socialization  Summary of Progress/Problems: Patient attended and participated in group. Patient participated in a game of Wheel of Fortune where group members went around the room and guessed a letter or attempted to solve using vocabulary words related to mental health. Patient was attentive but intrusive and was redirected several times during group. Patient was disorganized but attentive and offered support.  Keene Breath, MSW, LCSW 12/14/2015, 2:57 PM

## 2015-12-14 NOTE — Plan of Care (Signed)
Problem: Alteration in mood Goal: LTG-Patient's behavior demonstrates decreased manic symptoms (Patient's behavior demonstrates decreased manic symptoms to the point the patient is safe to return home and continue treatment in an outpatient setting)  Outcome: Progressing Decreased mania symptoms noted.

## 2015-12-14 NOTE — Progress Notes (Signed)
Patient very talkative and loud. Stated that she was happy to be here because she loves the place and everybody. During med-pass, patient went into a tangential rant about everything(her blood sugar, medications, shots, etc). Patient reassured. She attended groups and interacted with peers. Remains hyper religious and somewhat bizarre.

## 2015-12-14 NOTE — Progress Notes (Signed)
D: Pt denies SI/HI/AVH. Pt is pleasant and cooperative. Patient thoughts are organized and logical. Speech is non pressured.Pt appears less anxious and she is interacting with peers and staff appropriately.  A: Pt was offered support and encouragement. Pt was given scheduled medications.  Pt was encouraged to attend groups. Q 15 minute checks were done for safety.  R:Pt attends groups and interacts well with peers and staff. Pt is taking medication. Patient was placed on C -pap machine at bedtime per MD order and Pt request.Patient has no complaints.Pt receptive to treatment and safety maintained on unit.

## 2015-12-14 NOTE — Tx Team (Signed)
Interdisciplinary Treatment Plan Update (Adult)  Date:  12/14/2015 Time Reviewed:  12:43 PM  Progress in Treatment: Attending groups: Yes. Participating in groups:  Yes. Taking medication as prescribed:  Yes. Tolerating medication:  Yes. Family/Significant othe contact made:  Yes, individual(s) contacted:  sister Patient understands diagnosis:  Yes. Discussing patient identified problems/goals with staff:  Yes. Medical problems stabilized or resolved:  Yes. Denies suicidal/homicidal ideation: Yes. Issues/concerns per patient self-inventory:  Yes. Other:  New problem(s) identified: No, Describe:  NA  Discharge Plan or Barriers: Pt plans to return home and follow up with outpatient.    Reason for Continuation of Hospitalization: Mania Medication stabilization  Comments:Gina [name] is very tangential. She complains and worries about multiple things. Significant perseveration as she ask the same questions every day. Pt says she is sleeping better. Feels better but doesn't think she is ready for discharge. Denies SI, HI or hallucinations. No issues with appetite. At exam tangential, needs constants redirection. Speech is loud and still pressure. Seen early talking non stop, fast and loud, laughing while awaiting for her medication. Per nursing staff pt slept 5h last night.  Estimated length of stay: 7 days   New goal(s): NA  Review of initial/current patient goals per problem list:   1.  Goal(s): Patient will participate in aftercare plan * Met:  * Target date: at discharge * As evidenced by: Patient will participate within aftercare plan AEB aftercare provider and housing plan at discharge being identified.  2.  Goal (s): Patient will demonstrate decreased symptoms of mania. * Met: No  *  Target date: at discharge * As evidenced by: Patient will not endorse signs of mania or be deemed stable for discharge by MD.   Attendees: Patient:  Gina [name] 5/12/201712:43  PM  Family:   5/12/201712:43 PM  Physician:  Dr. Jerilee Hoh   5/12/201712:43 PM  Nursing:   Junita Push, RN  5/12/201712:43 PM  Case Manager:   5/12/201712:43 PM  Counselor:   5/12/201712:43 PM  Other:  Wray Kearns, [place] 5/12/201712:43 PM  Other:   5/12/201712:43 PM  Other:   5/12/201712:43 PM  Other:  5/12/201712:43 PM  Other:  5/12/201712:43 PM  Other:  5/12/201712:43 PM  Other:  5/12/201712:43 PM  Other:  5/12/201712:43 PM  Other:  5/12/201712:43 PM  Other:   5/12/201712:43 PM   Scribe for Treatment Team:   Wray Kearns, MSW, [place]  12/14/2015, 12:43 PM

## 2015-12-14 NOTE — Plan of Care (Signed)
Problem: Ineffective individual coping Goal: STG: Pt will be able to identify effective and ineffective STG: Pt will be able to identify effective and ineffective coping patterns  Outcome: Progressing Attended groups and interacted with peers.

## 2015-12-14 NOTE — Progress Notes (Addendum)
[facility] MD Progress Note  12/14/2015 7:20 AM Gina [name]  MRN:  [id number]  Subjective:  Gina [name] is very tangential. She complains and worries about multiple things. Significant perseveration as she ask the same questions every day.  Pt says she is sleeping better.  Feels better but doesn't think she is ready for discharge. Denies SI, HI or hallucinations.  No issues with appetite.  At exam tangential, needs constants redirection. Speech is loud and still pressure.  Seen early talking non stop, fast and loud, laughing while awaiting for her medication  Per nursing staff pt slept 5h last night.   Per Nursing: Pt denies SI/HI/AVH. Pt is pleasant and cooperative. Patient thoughts are organized and logical. Speech is non pressured.Pt appears less anxious and she is interacting with peers and staff appropriately.   Principal Problem: Bipolar disorder, manic, moderate ([place]) Diagnosis:   Patient Active Problem List   Diagnosis Date Noted  . Bipolar disorder, manic, moderate ([place]) [F31.12] 12/03/2015  . NASH (nonalcoholic steatohepatitis) [K75.81] 03/28/2015  . Gout [M10.9] 03/27/2015  . HCV antibody positive [R89.4] 03/27/2015  . Memory deficit [R41.3] 09/13/2014  . Uncontrolled type 2 diabetes mellitus with nephropathy ([place]) [E11.21, E11.65] 06/07/2014  . Right knee pain [M25.561] 03/16/2014  . Hyperlipidemia [E78.5]   . IDA (iron deficiency anemia) [D50.9] 01/26/2013  . History of colonic polyps [Z86.010] 11/11/2012  . Recurrent falls [R29.6] 10/02/2012  . Urine incontinence [R32] 08/27/2012  . Recurrent ventral hernia [K43.2] 08/11/2012  . OSA on CPAP [G47.33]   . Intertrigo [L30.4] 05/27/2012  . CKD (chronic kidney disease) stage 3, GFR 30-59 ml/min [N18.3]   . Obesity, Class II, BMI 35-39.9, with comorbidity ([place]) [E66.01]   . Colon cancer ([place]) [C18.9] 04/07/2012  . Enterocutaneous fistula [K63.2] 04/07/2012  . HTN (hypertension), benign [I10] 04/07/2012   Total Time  spent with patient: 30 minutes  Past Psychiatric History: Bipolar disorder.  Past Medical History:  Past Medical History  Diagnosis Date  . Bipolar depression [facility])     sees psychiatrist - psych admission 03/2015  . DJD (degenerative joint disease), lumbar     chronic lower back pain  . Obesity (BMI 30-39.9)   . History of colon cancer     s/p surgery  . 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 OSA on CPAP     6cm H2O  . RBBB   . Anemia in chronic kidney disease   . Blood transfusion without reported diagnosis 2009  . Cataract     left  . GERD (gastroesophageal reflux disease)   . Hyperlipidemia   . Hypertension   . History of bladder cancer 1997  . History of uterine cancer     s/p hysterectomy  . CKD (chronic kidney disease) stage 3, GFR 30-59 ml/min   . Personal history of colonic adenomas and colon cancer 11/11/2012  . IDA (iron deficiency anemia) 01/2013    thought due to h/o polyps  . Positive hepatitis C antibody test 09/2014    but negative confirmatory testing  . Colon cancer ([place]) 1990's  . Family history of breast cancer   . Family history of colon cancer   . Family history of stomach cancer   . Uncontrolled type 2 diabetes mellitus with nephropathy ([place]) 06/07/2014    [name] DSME 11/2015    Past Surgical History  Procedure Laterality Date  . Hernia repair  02/04/12  . Partial colectomy  about 2008    for colon cancer  .  I & d abdominal wound  02/19/12  . Removal of infected mesh and abdominal wound vac placement  02/24/12  . Reexploration of abdominal wound and allograft placemet  02/26/12  . Partial hysterectomy  1981    uterine cancer, R ovary remains  . Left oophorectomy  2005  . Colonoscopy  07/2009  . Dexa  12/2009    WNL  . Dobutamine stress echo  12/2009    no evidence of ischemia  . Colonoscopy  11/2012    2 tubular adenomas, mild diverticulosis, pending genetic testing for Lynch syndrome [name]) rpt 2 yrs  . Sleep study   02/2014    OSA - AHI 55, nadir 81% [name])  . Breast biopsy Right 01/2014    fibroadenoma  . Colonoscopy  02/2015    TA, diverticulosis, rpt 2 yrs [name])   Family History:  Family History  Problem Relation Age of Onset  . Colon cancer Mother 76  . Stomach cancer Mother     dx in her 28s?  Marland Kitchen Colon cancer Sister 49    Maternal half sister  . CAD Father     MI  . Diabetes Other     aunts and uncles both sides  . Arthritis Other     strong fmhx  . Mental illness Sister     anxiety/depression  . Breast cancer Maternal Grandmother   . Esophageal cancer Neg Hx   . Rectal cancer Neg Hx    Family Psychiatric  History: See H&P. Social History:  History  Alcohol Use No     History  Drug Use No    Social History   Social History  . Marital Status: Single    Spouse Name: N/A  . Number of Children: 0  . Years of Education: N/A   Social History Main Topics  . Smoking status: Never Smoker   . Smokeless tobacco: Never Used  . Alcohol Use: No  . Drug Use: No  . Sexual Activity: No   Other Topics Concern  . None   Social History Narrative   Lives with sister, no pets   Occupation: disabled, for bipolar and arthritis   Edu: GED   Activity: take walks   Diet: good water, vegetables daily   Religion: Catholic      Psych - [employer], Dr. Jimmye Norman (ph [phone])    Current Medications: Current Facility-Administered Medications  Medication Dose Route Frequency Provider Last Rate Last Dose  . acetaminophen (TYLENOL) tablet 650 mg  650 mg Oral Q6H PRN [name], MD   650 mg at 12/05/15 1253  . alum & mag hydroxide-simeth (MAALOX/MYLANTA) 200-200-20 MG/5ML suspension 30 mL  30 mL Oral Q4H PRN [name], MD   30 mL at 12/10/15 1608  . atenolol (TENORMIN) tablet 25 mg  25 mg Oral Q breakfast [name], MD   25 mg at 12/13/15 [id number]  . divalproex (DEPAKOTE) DR tablet 500 mg  500 mg Oral BID WC [name], MD   500 mg at  12/13/15 1644  . docusate sodium (COLACE) capsule 200 mg  200 mg Oral BID WC [name], MD   200 mg at 12/13/15 1644  . haloperidol (HALDOL) tablet 1 mg  1 mg Oral TID WC [name], MD   1 mg at 12/13/15 1644  . lactulose (CHRONULAC) 10 GM/15ML solution 10 g  10 g Oral QODAY [name], MD      . magnesium hydroxide (MILK OF MAGNESIA) suspension  30 mL  30 mL Oral Daily PRN [name], MD   30 mL at 12/07/15 1445  . metFORMIN (GLUCOPHAGE) tablet 500 mg  500 mg Oral Q breakfast [name], MD   500 mg at 12/13/15 0811  . OLANZapine (ZYPREXA) tablet 30 mg  30 mg Oral QPC supper [name], MD   30 mg at 12/13/15 1643  . pravastatin (PRAVACHOL) tablet 20 mg  20 mg Oral q1800 [name], MD   20 mg at 12/13/15 1644  . white petrolatum (VASELINE) gel   Topical PRN [name], MD        Lab Results:  Results for orders placed or performed during the hospital encounter of 12/03/15 (from the past 48 hour(s))  Glucose, capillary     Status: None   Collection Time: 12/13/15  6:56 AM  Result Value Ref Range   Glucose-Capillary 97 65 - 99 mg/dL   Comment 1 Notify RN     Blood Alcohol level:  Lab Results  Component Value Date   ETH <5 12/03/2015   ETH <5 11/20/2015    Musculoskeletal: Strength & Muscle Tone: within normal limits Gait & Station: normal Patient leans: N/A  Psychiatric Specialty Exam: Review of Systems  Constitutional: Negative.   HENT: Negative.   Eyes: Negative.   Respiratory: Negative.   Cardiovascular: Negative.   Gastrointestinal: Positive for diarrhea. Negative for nausea, vomiting, abdominal pain and constipation.  Genitourinary: Negative.   Musculoskeletal: Negative.   Skin: Negative.   Neurological: Negative.  Negative for dizziness.  Endo/Heme/Allergies: Negative.   Psychiatric/Behavioral: Positive for memory loss. Negative for depression, suicidal ideas,  hallucinations and substance abuse. The patient is not nervous/anxious and does not have insomnia.   All other systems reviewed and are negative.   Blood pressure 140/64, pulse 63, temperature 98.3 F (36.8 C), temperature source Oral, resp. rate 18, height 5\' 6"  (1.676 m), weight 89.132 kg (196 lb 8 oz), SpO2 99 %.Body mass index is 31.73 kg/(m^2).  General Appearance: Casual  Eye Contact::  Good  Speech:  Clear and Coherent  Volume:  Normal  Mood:  Euphoric  Affect:  Appropriate  Thought Process:  Tangential  Orientation:  Full (Time, Place, and Person)  Thought Content:  WDL  Suicidal Thoughts:  No  Homicidal Thoughts:  No  Memory:  Immediate;   Fair Recent;   Poor Remote;   Poor  Judgement:  Impaired  Insight:  Shallow  Psychomotor Activity:  Increased  Concentration:  Poor  Recall:  [place]  Language: Fair  Akathisia:  No  Handed:  Right  AIMS (if indicated):     Assets:  Communication Skills Desire for Improvement Housing  ADL's:  Intact  Cognition: WNL  Sleep:  Number of Hours: 5.75   Treatment Plan Summary: Daily contact with patient to assess and evaluate symptoms and progress in treatment and Medication management   For bipolar disorder: continue olanzapine 30 mg po q day. Continue depakote DR 500 mg po bid.   Level obtained on 5/8  was >109.  Ammonia was elevated at 40.   Still manic (pressure speech, hyperverbal, psychomotor agitation, insomnia, tangential thought process, euphoric mood). Intrusive in groups , per staff mild change since admission.  Second antipsychotic added due to lack of repose.  Now haldol  in addition to depakote and zyprexa.  Will increase haldol today to 2 mg po bid. Low dose benadryl added to prevent EPS.  There are many risks associated  with treating her with Depakote as she suffers from a steatosis, and hepatitis C. Depakote level and LFTs will have to be followed up closely. She is not a candidate for other mood  stabilizers such as lithium as she suffers from a stage III kidney failure and she is not a candidate for Tegretol as she on a multitude of other medications that can have interactions with Tegretol and Tegretol has same effects on liver as Depakote.  Depakote induced hyperammonemia: continue  lactulose 15 ml every other day daily---will recheck ammonia and depakote level on Monday  For insomnia: continue using CPAP  Diabetes: will decrease metformin to 500 mg as Cr Cl is low 39. CBG will be d/c as CBG has been mostly nl.  Hypertension: continue atenolol  25 mg q day  Dyslipidemia the patient will be continued on Pravachol 20 mg a day  Constipation the patient will be continued on Colace 200 mg by mouth twice a day and  Lactulose.  OSA: pt not sleeping well w/o CPAP. Will order CPAP qhs  Falls: Patient does not appear to having issues with her gait however per history she has recurrence falls at home. I will place the patient on fall precautions. No issues with gait here  Diet heart healthy and carb modified  Precautions fall and every 15 minute checks  Hospitalization and status continue voluntary admission  Disposition she will return to her sister's house once stable.  SW made contact with the sister.  No issues returning there.  Sister confirms they both have poor reading skills and struggle to comply with meds.   Discharge follow-up she'll continue to follow-up with Dr. Gretel Acre in our clinic.  SW has made a referral for [facility]---pending answer from [facility] to seeif they will take her case.  Labs HbA1c 6.9, TSH wnl , lipid panel (low HDL) and prolactin mildly elevated--Will check depakote, ammonia and LFTs on Monday am  [place]: they can deliver her medications for $5 and could prepare her meds in bubble packs for $1. Pt worries about her ability to afford medications.    [name], MD 12/14/2015, 7:20 AM

## 2015-12-15 LAB — GLUCOSE, CAPILLARY: GLUCOSE-CAPILLARY: 104 mg/dL — AB (ref 65–99)

## 2015-12-15 NOTE — Progress Notes (Signed)
D:  Patient is alert and oriented on the unit this shift.  Patient attended and actively participated in groups today.  Patient denies suicidal ideation, homicidal ideation, auditory or visual hallucinations at the present time.  Patient reports that her mood is improved today.  Patient is complaining of gas pains this shift.  Ordered medication is administered which patient states relieves her gas pains. A:  Scheduled medications are administered to patient as per MD orders.  Emotional support and encouragement are provided.  Patient is maintained on q.15 minute safety checks.  Patient is informed to notify staff with questions or concerns. R:  No adverse medication reactions are noted.  Patient is cooperative with medication administration and treatment plan today.  Patient is receptive, calm and cooperative on the unit at this time.  Patient interacts well with others on the unit this shift.  Patient contracts for safety at this time.  Patient remains safe at this time.

## 2015-12-15 NOTE — Plan of Care (Signed)
Problem: Ineffective individual coping Goal: LTG: Patient will report a decrease in negative feelings Outcome: Progressing Patient reports her mood is improved this shift

## 2015-12-15 NOTE — Plan of Care (Signed)
Problem: Ineffective individual coping Goal: STG-Increase in ability to manage activities of daily living Outcome: Progressing Patient is able to manage her activities of daily living currently

## 2015-12-15 NOTE — Progress Notes (Signed)
D: Pt denies SI/HI/AVH. Pt is pleasant and cooperative, affect is flat, but brightens upon approach. Patient is hyper religious, thoughts are disorganized. Pt appears less anxious and she is interacting with peers and staff appropriately.  A: Pt was offered support and encouragement.  Pt was encouraged to attend groups. Q 15 minute checks were done for safety.  R:Pt attends groups and interacts well with peers and staff. Pt has no complaints.Pt receptive to treatment and safety maintained on unit.

## 2015-12-15 NOTE — Plan of Care (Signed)
Problem: Alteration in mood Goal: LTG-Patient's behavior demonstrates decreased manic symptoms (Patient's behavior demonstrates decreased manic symptoms to the point the patient is safe to return home and continue treatment in an outpatient setting)  Outcome: Not Progressing Patient demonstrate manic symptoms.

## 2015-12-15 NOTE — Progress Notes (Signed)
[facility] MD Progress Note  12/15/2015 4:46 PM Gina [name]  MRN:  [id number]  Subjective:  Patient interviewed. Chart reviewed. Patient continues to be tangential hyperverbal somewhat intrusive. She goes on and on about how she does not want to be discharged from the hospital and likes it here. Seems somewhat grandiose about it. Tells me that she is sharing her gift with others by staying in the hospital. No new physical complaints. Eating well.  At exam tangential, needs constants redirection. Speech is loud and still pressure.  Seen early talking non stop, fast and loud, laughing while awaiting for her medication  Per nursing staff pt slept 5h last night.   Per Nursing: Pt denies SI/HI/AVH. Pt is pleasant and cooperative. Patient thoughts are organized and logical. Speech is non pressured.Pt appears less anxious and she is interacting with peers and staff appropriately.   Principal Problem: Bipolar disorder, manic, moderate ([place]) Diagnosis:   Patient Active Problem List   Diagnosis Date Noted  . Bipolar disorder, manic, moderate ([place]) [F31.12] 12/03/2015  . NASH (nonalcoholic steatohepatitis) [K75.81] 03/28/2015  . Gout [M10.9] 03/27/2015  . HCV antibody positive [R89.4] 03/27/2015  . Memory deficit [R41.3] 09/13/2014  . Uncontrolled type 2 diabetes mellitus with nephropathy ([place]) [E11.21, E11.65] 06/07/2014  . Right knee pain [M25.561] 03/16/2014  . Hyperlipidemia [E78.5]   . IDA (iron deficiency anemia) [D50.9] 01/26/2013  . History of colonic polyps [Z86.010] 11/11/2012  . Recurrent falls [R29.6] 10/02/2012  . Urine incontinence [R32] 08/27/2012  . Recurrent ventral hernia [K43.2] 08/11/2012  . OSA on CPAP [G47.33]   . Intertrigo [L30.4] 05/27/2012  . CKD (chronic kidney disease) stage 3, GFR 30-59 ml/min [N18.3]   . Obesity, Class II, BMI 35-39.9, with comorbidity ([place]) [E66.01]   . Colon cancer ([place]) [C18.9] 04/07/2012  . Enterocutaneous fistula [K63.2] 04/07/2012  . HTN  (hypertension), benign [I10] 04/07/2012   Total Time spent with patient: 30 minutes  Past Psychiatric History: Bipolar disorder.  Past Medical History:  Past Medical History  Diagnosis Date  . Bipolar depression [facility])     sees psychiatrist - psych admission 03/2015  . DJD (degenerative joint disease), lumbar     chronic lower back pain  . Obesity (BMI 30-39.9)   . History of colon cancer     s/p surgery  . 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 OSA on CPAP     6cm H2O  . RBBB   . Anemia in chronic kidney disease   . Blood transfusion without reported diagnosis 2009  . Cataract     left  . GERD (gastroesophageal reflux disease)   . Hyperlipidemia   . Hypertension   . History of bladder cancer 1997  . History of uterine cancer     s/p hysterectomy  . CKD (chronic kidney disease) stage 3, GFR 30-59 ml/min   . Personal history of colonic adenomas and colon cancer 11/11/2012  . IDA (iron deficiency anemia) 01/2013    thought due to h/o polyps  . Positive hepatitis C antibody test 09/2014    but negative confirmatory testing  . Colon cancer ([place]) 1990's  . Family history of breast cancer   . Family history of colon cancer   . Family history of stomach cancer   . Uncontrolled type 2 diabetes mellitus with nephropathy ([place]) 06/07/2014    [facility] DSME 11/2015    Past Surgical History  Procedure Laterality Date  . Hernia repair  02/04/12  . Partial colectomy  about 2008    for colon cancer  . I & d abdominal wound  02/19/12  . Removal of infected mesh and abdominal wound vac placement  02/24/12  . Reexploration of abdominal wound and allograft placemet  02/26/12  . Partial hysterectomy  1981    uterine cancer, R ovary remains  . Left oophorectomy  2005  . Colonoscopy  07/2009  . Dexa  12/2009    WNL  . Dobutamine stress echo  12/2009    no evidence of ischemia  . Colonoscopy  11/2012    2 tubular adenomas, mild diverticulosis, pending genetic testing  for Lynch syndrome [name]) rpt 2 yrs  . Sleep study  02/2014    OSA - AHI 55, nadir 81% [name])  . Breast biopsy Right 01/2014    fibroadenoma  . Colonoscopy  02/2015    TA, diverticulosis, rpt 2 yrs [name])   Family History:  Family History  Problem Relation Age of Onset  . Colon cancer Mother 29  . Stomach cancer Mother     dx in her 59s?  Marland Kitchen Colon cancer Sister 1    Maternal half sister  . CAD Father     MI  . Diabetes Other     aunts and uncles both sides  . Arthritis Other     strong fmhx  . Mental illness Sister     anxiety/depression  . Breast cancer Maternal Grandmother   . Esophageal cancer Neg Hx   . Rectal cancer Neg Hx    Family Psychiatric  History: See H&P. Social History:  History  Alcohol Use No     History  Drug Use No    Social History   Social History  . Marital Status: Single    Spouse Name: N/A  . Number of Children: 0  . Years of Education: N/A   Social History Main Topics  . Smoking status: Never Smoker   . Smokeless tobacco: Never Used  . Alcohol Use: No  . Drug Use: No  . Sexual Activity: No   Other Topics Concern  . None   Social History Narrative   Lives with sister, no pets   Occupation: disabled, for bipolar and arthritis   Edu: GED   Activity: take walks   Diet: good water, vegetables daily   Religion: Catholic      Psych - [employer], Dr. Jimmye Norman (ph [phone])    Current Medications: Current Facility-Administered Medications  Medication Dose Route Frequency Provider Last Rate Last Dose  . acetaminophen (TYLENOL) tablet 650 mg  650 mg Oral Q6H PRN [name], MD   650 mg at 12/05/15 1253  . alum & mag hydroxide-simeth (MAALOX/MYLANTA) 200-200-20 MG/5ML suspension 30 mL  30 mL Oral Q4H PRN [name], MD   30 mL at 12/15/15 1404  . atenolol (TENORMIN) tablet 25 mg  25 mg Oral Q breakfast [name], MD   25 mg at 12/15/15 0820  . diphenhydrAMINE (BENADRYL) capsule 25 mg  25  mg Oral QPC supper [name], MD   25 mg at 12/14/15 1641  . divalproex (DEPAKOTE) DR tablet 500 mg  500 mg Oral BID WC [name], MD   500 mg at 12/15/15 0820  . docusate sodium (COLACE) capsule 200 mg  200 mg Oral BID WC [name], MD   200 mg at 12/15/15 0820  . haloperidol (HALDOL) tablet 2 mg  2 mg Oral BID WC [name], MD   2  mg at 12/15/15 0820  . lactulose (CHRONULAC) 10 GM/15ML solution 10 g  10 g Oral QODAY [name], MD   10 g at 12/14/15 0927  . magnesium hydroxide (MILK OF MAGNESIA) suspension 30 mL  30 mL Oral Daily PRN [name], MD   30 mL at 12/07/15 1445  . metFORMIN (GLUCOPHAGE) tablet 500 mg  500 mg Oral Q breakfast [name], MD   500 mg at 12/15/15 0820  . OLANZapine (ZYPREXA) tablet 30 mg  30 mg Oral QPC supper [name], MD   30 mg at 12/14/15 1640  . pravastatin (PRAVACHOL) tablet 20 mg  20 mg Oral q1800 [name], MD   20 mg at 12/14/15 1641  . white petrolatum (VASELINE) gel   Topical PRN [name], MD        Lab Results:  Results for orders placed or performed during the hospital encounter of 12/03/15 (from the past 48 hour(s))  Glucose, capillary     Status: None   Collection Time: 12/14/15  7:14 AM  Result Value Ref Range   Glucose-Capillary 97 65 - 99 mg/dL   Comment 1 Notify RN   Glucose, capillary     Status: Abnormal   Collection Time: 12/15/15  7:04 AM  Result Value Ref Range   Glucose-Capillary 104 (H) 65 - 99 mg/dL   Comment 1 Notify RN    Comment 2 Document in Chart     Blood Alcohol level:  Lab Results  Component Value Date   ETH <5 12/03/2015   ETH <5 11/20/2015    Musculoskeletal: Strength & Muscle Tone: within normal limits Gait & Station: normal Patient leans: N/A  Psychiatric Specialty Exam: Review of Systems  Constitutional: Negative.   HENT: Negative.   Eyes: Negative.   Respiratory:  Negative.   Cardiovascular: Negative.   Gastrointestinal: Positive for diarrhea. Negative for nausea, vomiting, abdominal pain and constipation.  Genitourinary: Negative.   Musculoskeletal: Negative.   Skin: Negative.   Neurological: Negative.  Negative for dizziness.  Endo/Heme/Allergies: Negative.   Psychiatric/Behavioral: Positive for memory loss. Negative for depression, suicidal ideas, hallucinations and substance abuse. The patient is not nervous/anxious and does not have insomnia.   All other systems reviewed and are negative.   Blood pressure 149/57, pulse 64, temperature 98.2 F (36.8 C), temperature source Oral, resp. rate 20, height 5' 6"  (1.676 m), weight 89.132 kg (196 lb 8 oz), SpO2 99 %.Body mass index is 31.73 kg/(m^2).  General Appearance: Casual  Eye Contact::  Good  Speech:  Clear and Coherent  Volume:  Normal  Mood:  Euphoric  Affect:  Appropriate  Thought Process:  Tangential  Orientation:  Full (Time, Place, and Person)  Thought Content:  WDL  Suicidal Thoughts:  No  Homicidal Thoughts:  No  Memory:  Immediate;   Fair Recent;   Poor Remote;   Poor  Judgement:  Impaired  Insight:  Shallow  Psychomotor Activity:  Increased  Concentration:  Poor  Recall:  [place]  Language: Fair  Akathisia:  No  Handed:  Right  AIMS (if indicated):     Assets:  Communication Skills Desire for Improvement Housing  ADL's:  Intact  Cognition: WNL  Sleep:  Number of Hours: 5.15   Treatment Plan Summary: Daily contact with patient to assess and evaluate symptoms and progress in treatment and Medication management   For bipolar disorder: continue olanzapine 30 mg po q day. Continue depakote DR 500 mg po bid.  Level obtained on 5/8  was >109.  Ammonia was elevated at 40.   Still manic (pressure speech, hyperverbal, psychomotor agitation, insomnia, tangential thought process, euphoric mood). Intrusive in groups , per staff mild change since admission.   Second antipsychotic added due to lack of repose.  Now haldol  in addition to depakote and zyprexa.  Will increase haldol today to 2 mg po bid. Low dose benadryl added to prevent EPS.  There are many risks associated with treating her with Depakote as she suffers from a steatosis, and hepatitis C. Depakote level and LFTs will have to be followed up closely. She is not a candidate for other mood stabilizers such as lithium as she suffers from a stage III kidney failure and she is not a candidate for Tegretol as she on a multitude of other medications that can have interactions with Tegretol and Tegretol has same effects on liver as Depakote.  Depakote induced hyperammonemia: continue  lactulose 15 ml every other day daily---will recheck ammonia and depakote level on Monday  For insomnia: continue using CPAP  Diabetes: will decrease metformin to 500 mg as Cr Cl is low 39. CBG will be d/c as CBG has been mostly nl.  Hypertension: continue atenolol  25 mg q day  Dyslipidemia the patient will be continued on Pravachol 20 mg a day  Constipation the patient will be continued on Colace 200 mg by mouth twice a day and  Lactulose.  OSA: pt not sleeping well w/o CPAP. Will order CPAP qhs  Falls: Patient does not appear to having issues with her gait however per history she has recurrence falls at home. I will place the patient on fall precautions. No issues with gait here  Diet heart healthy and carb modified  Precautions fall and every 15 minute checks  Hospitalization and status continue voluntary admission  Disposition she will return to her sister's house once stable.  SW made contact with the sister.  No issues returning there.  Sister confirms they both have poor reading skills and struggle to comply with meds.   Discharge follow-up she'll continue to follow-up with Dr. Gretel Acre in our clinic.  SW has made a referral for [facility]---pending answer from [facility] to seeif they will take her  case.  Labs HbA1c 6.9, TSH wnl , lipid panel (low HDL) and prolactin mildly elevated--Will check depakote, ammonia and LFTs on Monday am  [place]: they can deliver her medications for $5 and could prepare her meds in bubble packs for $1. Pt worries about her ability to afford medications.    Alethia Berthold, MD 12/15/2015, 4:46 PM

## 2015-12-15 NOTE — Progress Notes (Signed)
Pt refused to placed CPAP when offered by RN and RT

## 2015-12-15 NOTE — Progress Notes (Signed)
D: Patient is alert and oriented on the unit this shift. Patient attended and actively participated in groups today. Patient denies suicidal ideation, homicidal ideation, auditory or visual hallucinations at the present time.  A: Scheduled medications are administered to patient as per MD orders. Emotional support and encouragement are provided. Patient is maintained on q.15 minute safety checks. Patient is informed to notify staff with questions or concerns. R: No adverse medication reactions are noted. Patient is cooperative with medication administration and treatment plan today. Patient is receptive,tangential and manic at times but is cooperative on the unit at this time. Patient interacts well with others on the unit this shift. Patient contracts for safety at this time. Patient remains safe at this time.

## 2015-12-15 NOTE — Plan of Care (Signed)
Problem: Consults Goal: [facility] General Treatment Patient Education Outcome: Progressing Patient receptive to educational materials given to her and is Investment banker, operational

## 2015-12-15 NOTE — Plan of Care (Signed)
Problem: Ineffective individual coping Goal: LTG: Patient will report a decrease in negative feelings Outcome: Progressing Patient does report negative feelings when they occur [place]

## 2015-12-15 NOTE — BHH Group Notes (Signed)
[place] LCSW Group Therapy  12/15/2015 2:18 PM  Type of Therapy: Group Therapy  Participation Level: Active   Participation Quality: Attentive  Affect:Euphoric    Cognitive: Disorganized   Insight: Limited  Engagement in Therapy: Limited  Modes of Intervention: Discussion, Education, Socialization and Support  Summary of Progress/Problems: Balance in life: Patients will discuss the concept of balance and how it looks and feels to be unbalanced. Pt will identify areas in their life that is unbalanced and ways to become more balanced. Pt attended group and stayed the entire time. Pt attempted to participate but struggled to stay on topic. She would often discuss her faith throughout group. She asked to resite the lords prayer at the end of group.    [place] MSW, [place]  12/15/2015, 2:18 PM

## 2015-12-16 LAB — GLUCOSE, CAPILLARY: GLUCOSE-CAPILLARY: 94 mg/dL (ref 65–99)

## 2015-12-16 NOTE — Progress Notes (Signed)
Patient has been in bed resting eyes closed most of evening shift. No distress or issues to report on shift at this time. She remains in bed eyes closed.

## 2015-12-16 NOTE — BHH Group Notes (Signed)
[facility] Group Notes:  (Nursing/MHT/Case Management/Adjunct)  Date:  12/16/2015  Time:  12:15 AM  Type of Therapy:  Group Therapy  Participation Level:  Did Not Attend    Gina [name] 12/16/2015, 12:15 AM

## 2015-12-16 NOTE — Progress Notes (Signed)
Patient alert and oriented. She denies SI or HI. Mood is enthusiastic, elated. Speech still somewhat pressured and tangential at times. No evidence of psychosis. Patient has been cooperative with all nursing interventions. Social with select peers. Continue with current treatment plan and monitor response.

## 2015-12-16 NOTE — BHH Group Notes (Signed)
[place] LCSW Group Therapy  12/16/2015 2:23 PM  Type of Therapy:  Group Therapy  Participation Level:  Active  Participation Quality:  Attentive  Affect:  Euphoric    Cognitive:  Disorganized   Insight:  Limited  Engagement in Therapy:  Limited  Modes of Intervention:  Discussion, Education, Socialization and Support  Summary of Progress/Problems: Pt will identify unhealthy thoughts and how they impact their emotions and behavior. Pt will be encouraged to discuss these thoughts, emotions and behaviors with the group. Pt attended group and stayed the entire time. She struggled to stay on topic. She discussed her faith throughout group. She was tangential and difficult to redirect.   [place] MSW, [place]  12/16/2015, 2:23 PM

## 2015-12-16 NOTE — Progress Notes (Signed)
[facility] MD Progress Note  12/16/2015 2:23 PM Gina [name]  MRN:  [id number]  Subjective:  Follow-up September 14. No new complaints. Still tangential and hyperverbal but not agitated or threatening. Tolerating medicine. Vital stable. No new physical complaints. At exam tangential, needs constants redirection. Speech is loud and still pressure.  Seen early talking non stop, fast and loud, laughing while awaiting for her medication  Per nursing staff pt slept 5h last night.   Per Nursing: Pt denies SI/HI/AVH. Pt is pleasant and cooperative. Patient thoughts are organized and logical. Speech is non pressured.Pt appears less anxious and she is interacting with peers and staff appropriately.   Principal Problem: Bipolar disorder, manic, moderate ([place]) Diagnosis:   Patient Active Problem List   Diagnosis Date Noted  . Bipolar disorder, manic, moderate ([place]) [F31.12] 12/03/2015  . NASH (nonalcoholic steatohepatitis) [K75.81] 03/28/2015  . Gout [M10.9] 03/27/2015  . HCV antibody positive [R89.4] 03/27/2015  . Memory deficit [R41.3] 09/13/2014  . Uncontrolled type 2 diabetes mellitus with nephropathy ([place]) [E11.21, E11.65] 06/07/2014  . Right knee pain [M25.561] 03/16/2014  . Hyperlipidemia [E78.5]   . IDA (iron deficiency anemia) [D50.9] 01/26/2013  . History of colonic polyps [Z86.010] 11/11/2012  . Recurrent falls [R29.6] 10/02/2012  . Urine incontinence [R32] 08/27/2012  . Recurrent ventral hernia [K43.2] 08/11/2012  . OSA on CPAP [G47.33]   . Intertrigo [L30.4] 05/27/2012  . CKD (chronic kidney disease) stage 3, GFR 30-59 ml/min [N18.3]   . Obesity, Class II, BMI 35-39.9, with comorbidity ([place]) [E66.01]   . Colon cancer ([place]) [C18.9] 04/07/2012  . Enterocutaneous fistula [K63.2] 04/07/2012  . HTN (hypertension), benign [I10] 04/07/2012   Total Time spent with patient: 30 minutes  Past Psychiatric History: Bipolar disorder.  Past Medical History:  Past Medical History   Diagnosis Date  . Bipolar depression [facility])     sees psychiatrist - psych admission 03/2015  . DJD (degenerative joint disease), lumbar     chronic lower back pain  . Obesity (BMI 30-39.9)   . History of colon cancer     s/p surgery  . 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 OSA on CPAP     6cm H2O  . RBBB   . Anemia in chronic kidney disease   . Blood transfusion without reported diagnosis 2009  . Cataract     left  . GERD (gastroesophageal reflux disease)   . Hyperlipidemia   . Hypertension   . History of bladder cancer 1997  . History of uterine cancer     s/p hysterectomy  . CKD (chronic kidney disease) stage 3, GFR 30-59 ml/min   . Personal history of colonic adenomas and colon cancer 11/11/2012  . IDA (iron deficiency anemia) 01/2013    thought due to h/o polyps  . Positive hepatitis C antibody test 09/2014    but negative confirmatory testing  . Colon cancer ([place]) 1990's  . Family history of breast cancer   . Family history of colon cancer   . Family history of stomach cancer   . Uncontrolled type 2 diabetes mellitus with nephropathy ([place]) 06/07/2014    [facility] DSME 11/2015    Past Surgical History  Procedure Laterality Date  . Hernia repair  02/04/12  . Partial colectomy  about 2008    for colon cancer  . I & d abdominal wound  02/19/12  . Removal of infected mesh and abdominal wound vac placement  02/24/12  . Reexploration of abdominal wound  and allograft placemet  02/26/12  . Partial hysterectomy  1981    uterine cancer, R ovary remains  . Left oophorectomy  2005  . Colonoscopy  07/2009  . Dexa  12/2009    WNL  . Dobutamine stress echo  12/2009    no evidence of ischemia  . Colonoscopy  11/2012    2 tubular adenomas, mild diverticulosis, pending genetic testing for Lynch syndrome [name]) rpt 2 yrs  . Sleep study  02/2014    OSA - AHI 55, nadir 81% [name])  . Breast biopsy Right 01/2014    fibroadenoma  . Colonoscopy  02/2015    TA,  diverticulosis, rpt 2 yrs [name])   Family History:  Family History  Problem Relation Age of Onset  . Colon cancer Mother 74  . Stomach cancer Mother     dx in her 26s?  Marland Kitchen Colon cancer Sister 75    Maternal half sister  . CAD Father     MI  . Diabetes Other     aunts and uncles both sides  . Arthritis Other     strong fmhx  . Mental illness Sister     anxiety/depression  . Breast cancer Maternal Grandmother   . Esophageal cancer Neg Hx   . Rectal cancer Neg Hx    Family Psychiatric  History: See H&P. Social History:  History  Alcohol Use No     History  Drug Use No    Social History   Social History  . Marital Status: Single    Spouse Name: N/A  . Number of Children: 0  . Years of Education: N/A   Social History Main Topics  . Smoking status: Never Smoker   . Smokeless tobacco: Never Used  . Alcohol Use: No  . Drug Use: No  . Sexual Activity: No   Other Topics Concern  . None   Social History Narrative   Lives with sister, no pets   Occupation: disabled, for bipolar and arthritis   Edu: GED   Activity: take walks   Diet: good water, vegetables daily   Religion: Catholic      Psych - [employer], Dr. Jimmye Norman (ph [phone])    Current Medications: Current Facility-Administered Medications  Medication Dose Route Frequency Provider Last Rate Last Dose  . acetaminophen (TYLENOL) tablet 650 mg  650 mg Oral Q6H PRN [name], MD   650 mg at 12/05/15 1253  . alum & mag hydroxide-simeth (MAALOX/MYLANTA) 200-200-20 MG/5ML suspension 30 mL  30 mL Oral Q4H PRN [name], MD   30 mL at 12/16/15 0703  . atenolol (TENORMIN) tablet 25 mg  25 mg Oral Q breakfast [name], MD   25 mg at 12/16/15 2957  . diphenhydrAMINE (BENADRYL) capsule 25 mg  25 mg Oral QPC supper [name], MD   25 mg at 12/15/15 1652  . divalproex (DEPAKOTE) DR tablet 500 mg  500 mg Oral BID WC [name], MD   500 mg at  12/16/15 4734  . docusate sodium (COLACE) capsule 200 mg  200 mg Oral BID WC [name], MD   200 mg at 12/16/15 0370  . haloperidol (HALDOL) tablet 2 mg  2 mg Oral BID WC [name], MD   2 mg at 12/16/15 9643  . lactulose (CHRONULAC) 10 GM/15ML solution 10 g  10 g Oral QODAY [name], MD   10 g at 12/16/15 0941  . magnesium hydroxide (MILK OF MAGNESIA) suspension  30 mL  30 mL Oral Daily PRN [name], MD   30 mL at 12/07/15 1445  . metFORMIN (GLUCOPHAGE) tablet 500 mg  500 mg Oral Q breakfast [name], MD   500 mg at 12/16/15 0636  . OLANZapine (ZYPREXA) tablet 30 mg  30 mg Oral QPC supper [name], MD   30 mg at 12/15/15 1651  . pravastatin (PRAVACHOL) tablet 20 mg  20 mg Oral q1800 [name], MD   20 mg at 12/15/15 1652  . white petrolatum (VASELINE) gel   Topical PRN [name], MD        Lab Results:  Results for orders placed or performed during the hospital encounter of 12/03/15 (from the past 48 hour(s))  Glucose, capillary     Status: Abnormal   Collection Time: 12/15/15  7:04 AM  Result Value Ref Range   Glucose-Capillary 104 (H) 65 - 99 mg/dL   Comment 1 Notify RN    Comment 2 Document in Chart   Glucose, capillary     Status: None   Collection Time: 12/16/15  6:43 AM  Result Value Ref Range   Glucose-Capillary 94 65 - 99 mg/dL    Blood Alcohol level:  Lab Results  Component Value Date   ETH <5 12/03/2015   ETH <5 11/20/2015    Musculoskeletal: Strength & Muscle Tone: within normal limits Gait & Station: normal Patient leans: N/A  Psychiatric Specialty Exam: Review of Systems  Constitutional: Negative.   HENT: Negative.   Eyes: Negative.   Respiratory: Negative.   Cardiovascular: Negative.   Gastrointestinal: Positive for diarrhea. Negative for nausea, vomiting, abdominal pain and constipation.  Genitourinary: Negative.   Musculoskeletal: Negative.    Skin: Negative.   Neurological: Negative.  Negative for dizziness.  Endo/Heme/Allergies: Negative.   Psychiatric/Behavioral: Positive for memory loss. Negative for depression, suicidal ideas, hallucinations and substance abuse. The patient is not nervous/anxious and does not have insomnia.   All other systems reviewed and are negative.   Blood pressure 131/65, pulse 56, temperature 98.6 F (37 C), temperature source Oral, resp. rate 20, height 5' 6"  (1.676 m), weight 89.132 kg (196 lb 8 oz), SpO2 99 %.Body mass index is 31.73 kg/(m^2).  General Appearance: Casual  Eye Contact::  Good  Speech:  Clear and Coherent  Volume:  Normal  Mood:  Euphoric  Affect:  Appropriate  Thought Process:  Tangential  Orientation:  Full (Time, Place, and Person)  Thought Content:  WDL  Suicidal Thoughts:  No  Homicidal Thoughts:  No  Memory:  Immediate;   Fair Recent;   Poor Remote;   Poor  Judgement:  Impaired  Insight:  Shallow  Psychomotor Activity:  Increased  Concentration:  Poor  Recall:  [place]  Language: Fair  Akathisia:  No  Handed:  Right  AIMS (if indicated):     Assets:  Communication Skills Desire for Improvement Housing  ADL's:  Intact  Cognition: WNL  Sleep:  Number of Hours: 5.3   Treatment Plan Summary: Daily contact with patient to assess and evaluate symptoms and progress in treatment and Medication management   For bipolar disorder: continue olanzapine 30 mg po q day. Continue depakote DR 500 mg po bid.   Level obtained on 5/8  was >109.  Ammonia was elevated at 40.   Still manic (pressure speech, hyperverbal, psychomotor agitation, insomnia, tangential thought process, euphoric mood). Intrusive in groups , per staff mild change since admission.  Second antipsychotic  added due to lack of repose.  Now haldol  in addition to depakote and zyprexa.  Will increase haldol today to 2 mg po bid. Low dose benadryl added to prevent EPS.  There are many risks  associated with treating her with Depakote as she suffers from a steatosis, and hepatitis C. Depakote level and LFTs will have to be followed up closely. She is not a candidate for other mood stabilizers such as lithium as she suffers from a stage III kidney failure and she is not a candidate for Tegretol as she on a multitude of other medications that can have interactions with Tegretol and Tegretol has same effects on liver as Depakote.  Depakote induced hyperammonemia: continue  lactulose 15 ml every other day daily---will recheck ammonia and depakote level on Monday  For insomnia: continue using CPAP  Diabetes: will decrease metformin to 500 mg as Cr Cl is low 39. CBG will be d/c as CBG has been mostly nl.  Hypertension: continue atenolol  25 mg q day  Dyslipidemia the patient will be continued on Pravachol 20 mg a day  Constipation the patient will be continued on Colace 200 mg by mouth twice a day and  Lactulose.  OSA: pt not sleeping well w/o CPAP. Will order CPAP qhs  Falls: Patient does not appear to having issues with her gait however per history she has recurrence falls at home. I will place the patient on fall precautions. No issues with gait here  Diet heart healthy and carb modified  Precautions fall and every 15 minute checks  Hospitalization and status continue voluntary admission  Disposition she will return to her sister's house once stable.  SW made contact with the sister.  No issues returning there.  Sister confirms they both have poor reading skills and struggle to comply with meds.   Discharge follow-up she'll continue to follow-up with Dr. Gretel Acre in our clinic.  SW has made a referral for [facility]---pending answer from [facility] to seeif they will take her case.  Labs HbA1c 6.9, TSH wnl , lipid panel (low HDL) and prolactin mildly elevated--Will check depakote, ammonia and LFTs on Monday am  [place]: they can deliver her medications for $5 and  could prepare her meds in bubble packs for $1. Pt worries about her ability to afford medications.    Alethia Berthold, MD 12/16/2015, 2:23 PM

## 2015-12-16 NOTE — [facility] Group Notes (Signed)
[facility] Notes:  (Nursing/MHT/Case Management/Adjunct)  Date:  12/16/2015  Time:  10:24 AM  Type of Therapy:  goal setting   Participation Level:  Did Not Attend  Charise Killian 12/16/2015, 10:24 AM

## 2015-12-17 LAB — COMPREHENSIVE METABOLIC PANEL
ALT: 32 U/L (ref 14–54)
AST: 33 U/L (ref 15–41)
Albumin: 3.9 g/dL (ref 3.5–5.0)
Alkaline Phosphatase: 94 U/L (ref 38–126)
Anion gap: 7 (ref 5–15)
BUN: 33 mg/dL — ABNORMAL HIGH (ref 6–20)
CHLORIDE: 97 mmol/L — AB (ref 101–111)
CO2: 31 mmol/L (ref 22–32)
Calcium: 9 mg/dL (ref 8.9–10.3)
Creatinine, Ser: 1.56 mg/dL — ABNORMAL HIGH (ref 0.44–1.00)
GFR, EST AFRICAN AMERICAN: 39 mL/min — AB (ref >60)
GFR, EST NON AFRICAN AMERICAN: 34 mL/min — AB (ref >60)
Glucose, Bld: 100 mg/dL — ABNORMAL HIGH (ref 65–99)
POTASSIUM: 4.6 mmol/L (ref 3.5–5.1)
SODIUM: 135 mmol/L (ref 135–145)
Total Bilirubin: 0.7 mg/dL (ref 0.3–1.2)
Total Protein: 6.7 g/dL (ref 6.5–8.1)

## 2015-12-17 LAB — VALPROIC ACID LEVEL: VALPROIC ACID LVL: 79 ug/mL (ref 50.0–100.0)

## 2015-12-17 LAB — AMMONIA: AMMONIA: 10 umol/L (ref 9–35)

## 2015-12-17 MED ORDER — METOPROLOL TARTRATE 25 MG PO TABS
25.0000 mg | ORAL_TABLET | Freq: Two times a day (BID) | ORAL | Status: DC
  Administered 2015-12-18 – 2015-12-19 (×3): 25 mg via ORAL
  Filled 2015-12-17 (×3): qty 1

## 2015-12-17 MED ORDER — HALOPERIDOL 2 MG PO TABS
4.0000 mg | ORAL_TABLET | Freq: Every day | ORAL | Status: DC
  Administered 2015-12-17: 4 mg via ORAL
  Filled 2015-12-17: qty 2

## 2015-12-17 MED ORDER — POLYETHYLENE GLYCOL 3350 17 G PO PACK: 17.0000 g | PACK | Freq: Every day | ORAL | Status: DC | PRN

## 2015-12-17 MED ORDER — LITHIUM CARBONATE ER 300 MG PO TBCR
300.0000 mg | EXTENDED_RELEASE_TABLET | Freq: Every day | ORAL | Status: DC
Start: 2015-12-17 — End: 2015-12-17

## 2015-12-17 NOTE — Plan of Care (Signed)
Problem: Consults Goal: [facility] General Treatment Patient Education Outcome: Progressing Patient receptive to patient education on the unit thus far [employer] RN

## 2015-12-17 NOTE — [facility] Group Notes (Signed)
[facility] Notes:  (Nursing/MHT/Case Management/Adjunct)  Date:  12/17/2015  Time:  4:11 PM  Type of Therapy:  Psychoeducational Skills  Participation Level:  Active  Participation Quality:  Attentive and Redirectable  Affect:  Appropriate  Cognitive:  Oriented  Insight:  Appropriate  Engagement in Group:  Supportive  Modes of Intervention:  Discussion and Education  Summary of Progress/Problems:  Charise Killian 12/17/2015, 4:11 PM

## 2015-12-17 NOTE — Progress Notes (Signed)
[facility] MD Progress Note  12/17/2015 1:09 PM Gina [name]  MRN:  [id number]  Subjective:  Still tangential and hyperverbal but not agitated or threatening. Tolerating medicine. Vital signs show patient has been bradycardic. Heart rate today was 49.  At exam she is less tangential, needs less redirection. Speech is not as loud or as pressure.    Per staff she continues to attend groups but continues to be hyperverbal and hyperreligious.  Gina [name] usually has a multitude of somatic complaints. Today she tells me she is constipated. She also complains about not being able to stay asleep all throughout the night the patient stated that she's been waking up at 2:00 in the morning and then cannot go back to sleep. She also tells me she feels groggy and drowsy in the daytime.    Per Nursing: Patient alert and oriented. She denies SI or HI. Mood is enthusiastic, elated. Speech still somewhat pressured and tangential at times. No evidence of psychosis. Patient has been cooperative with all nursing interventions. Social with select peers. Continue with current treatment plan and monitor response.  Principal Problem: Bipolar disorder, manic, moderate ([place]) Diagnosis:   Patient Active Problem List   Diagnosis Date Noted  . Bipolar disorder, manic, moderate ([place]) [F31.12] 12/03/2015  . NASH (nonalcoholic steatohepatitis) [K75.81] 03/28/2015  . Gout [M10.9] 03/27/2015  . HCV antibody positive [R89.4] 03/27/2015  . Memory deficit [R41.3] 09/13/2014  . Uncontrolled type 2 diabetes mellitus with nephropathy ([place]) [E11.21, E11.65] 06/07/2014  . Right knee pain [M25.561] 03/16/2014  . Hyperlipidemia [E78.5]   . IDA (iron deficiency anemia) [D50.9] 01/26/2013  . History of colonic polyps [Z86.010] 11/11/2012  . Recurrent falls [R29.6] 10/02/2012  . Urine incontinence [R32] 08/27/2012  . Recurrent ventral hernia [K43.2] 08/11/2012  . OSA on CPAP [G47.33]   . Intertrigo [L30.4] 05/27/2012  .  CKD (chronic kidney disease) stage 3, GFR 30-59 ml/min [N18.3]   . Obesity, Class II, BMI 35-39.9, with comorbidity ([place]) [E66.01]   . Colon cancer ([place]) [C18.9] 04/07/2012  . Enterocutaneous fistula [K63.2] 04/07/2012  . HTN (hypertension), benign [I10] 04/07/2012   Total Time spent with patient: 30 minutes  Past Psychiatric History: Bipolar disorder.  Past Medical History:  Past Medical History  Diagnosis Date  . Bipolar depression [facility])     sees psychiatrist - psych admission 03/2015  . DJD (degenerative joint disease), lumbar     chronic lower back pain  . Obesity (BMI 30-39.9)   . History of colon cancer     s/p surgery  . 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 OSA on CPAP     6cm H2O  . RBBB   . Anemia in chronic kidney disease   . Blood transfusion without reported diagnosis 2009  . Cataract     left  . GERD (gastroesophageal reflux disease)   . Hyperlipidemia   . Hypertension   . History of bladder cancer 1997  . History of uterine cancer     s/p hysterectomy  . CKD (chronic kidney disease) stage 3, GFR 30-59 ml/min   . Personal history of colonic adenomas and colon cancer 11/11/2012  . IDA (iron deficiency anemia) 01/2013    thought due to h/o polyps  . Positive hepatitis C antibody test 09/2014    but negative confirmatory testing  . Colon cancer ([place]) 1990's  . Family history of breast cancer   . Family history of colon cancer   . Family history of  stomach cancer   . Uncontrolled type 2 diabetes mellitus with nephropathy ([place]) 06/07/2014    [facility] DSME 11/2015    Past Surgical History  Procedure Laterality Date  . Hernia repair  02/04/12  . Partial colectomy  about 2008    for colon cancer  . I & d abdominal wound  02/19/12  . Removal of infected mesh and abdominal wound vac placement  02/24/12  . Reexploration of abdominal wound and allograft placemet  02/26/12  . Partial hysterectomy  1981    uterine cancer, R ovary remains  .  Left oophorectomy  2005  . Colonoscopy  07/2009  . Dexa  12/2009    WNL  . Dobutamine stress echo  12/2009    no evidence of ischemia  . Colonoscopy  11/2012    2 tubular adenomas, mild diverticulosis, pending genetic testing for Lynch syndrome [name]) rpt 2 yrs  . Sleep study  02/2014    OSA - AHI 55, nadir 81% [name])  . Breast biopsy Right 01/2014    fibroadenoma  . Colonoscopy  02/2015    TA, diverticulosis, rpt 2 yrs [name])   Family History:  Family History  Problem Relation Age of Onset  . Colon cancer Mother 69  . Stomach cancer Mother     dx in her 37s?  Marland Kitchen Colon cancer Sister 42    Maternal half sister  . CAD Father     MI  . Diabetes Other     aunts and uncles both sides  . Arthritis Other     strong fmhx  . Mental illness Sister     anxiety/depression  . Breast cancer Maternal Grandmother   . Esophageal cancer Neg Hx   . Rectal cancer Neg Hx    Family Psychiatric  History: See H&P. Social History:  History  Alcohol Use No     History  Drug Use No    Social History   Social History  . Marital Status: Single    Spouse Name: N/A  . Number of Children: 0  . Years of Education: N/A   Social History Main Topics  . Smoking status: Never Smoker   . Smokeless tobacco: Never Used  . Alcohol Use: No  . Drug Use: No  . Sexual Activity: No   Other Topics Concern  . None   Social History Narrative   Lives with sister, no pets   Occupation: disabled, for bipolar and arthritis   Edu: GED   Activity: take walks   Diet: good water, vegetables daily   Religion: Catholic      Psych - [employer], Dr. Jimmye Norman (ph [phone])    Current Medications: Current Facility-Administered Medications  Medication Dose Route Frequency Provider Last Rate Last Dose  . acetaminophen (TYLENOL) tablet 650 mg  650 mg Oral Q6H PRN [name], MD   650 mg at 12/05/15 1253  . alum & mag hydroxide-simeth (MAALOX/MYLANTA) 200-200-20 MG/5ML suspension 30 mL   30 mL Oral Q4H PRN [name], MD   30 mL at 12/16/15 0703  . atenolol (TENORMIN) tablet 25 mg  25 mg Oral Q breakfast [name], MD   25 mg at 12/17/15 0839  . diphenhydrAMINE (BENADRYL) capsule 25 mg  25 mg Oral QPC supper [name], MD   25 mg at 12/16/15 1708  . divalproex (DEPAKOTE) DR tablet 500 mg  500 mg Oral BID WC [name], MD   500 mg at 12/17/15 0839  .  docusate sodium (COLACE) capsule 200 mg  200 mg Oral BID WC [name], MD   200 mg at 12/17/15 0839  . haloperidol (HALDOL) tablet 4 mg  4 mg Oral QPC supper [name], MD      . lactulose (CHRONULAC) 10 GM/15ML solution 10 g  10 g Oral QODAY [name], MD   10 g at 12/16/15 0941  . magnesium hydroxide (MILK OF MAGNESIA) suspension 30 mL  30 mL Oral Daily PRN [name], MD   30 mL at 12/17/15 0839  . metFORMIN (GLUCOPHAGE) tablet 500 mg  500 mg Oral Q breakfast [name], MD   500 mg at 12/17/15 0839  . OLANZapine (ZYPREXA) tablet 30 mg  30 mg Oral QPC supper [name], MD   30 mg at 12/16/15 1708  . polyethylene glycol (MIRALAX / GLYCOLAX) packet 17 g  17 g Oral Daily PRN [name], MD      . pravastatin (PRAVACHOL) tablet 20 mg  20 mg Oral q1800 [name], MD   20 mg at 12/16/15 1708  . white petrolatum (VASELINE) gel   Topical PRN [name], MD        Lab Results:  Results for orders placed or performed during the hospital encounter of 12/03/15 (from the past 48 hour(s))  Glucose, capillary     Status: None   Collection Time: 12/16/15  6:43 AM  Result Value Ref Range   Glucose-Capillary 94 65 - 99 mg/dL    Blood Alcohol level:  Lab Results  Component Value Date   ETH <5 12/03/2015   ETH <5 11/20/2015    Musculoskeletal: Strength & Muscle Tone: within normal limits Gait & Station: normal Patient leans: N/A  Psychiatric Specialty Exam: Review of  Systems  Constitutional: Negative.   HENT: Negative.   Eyes: Negative.   Respiratory: Negative.   Cardiovascular: Negative.   Gastrointestinal: Positive for constipation. Negative for nausea, vomiting, abdominal pain and diarrhea.  Genitourinary: Negative.   Musculoskeletal: Negative.   Skin: Negative.   Neurological: Negative.  Negative for dizziness.  Endo/Heme/Allergies: Negative.   Psychiatric/Behavioral: Positive for memory loss. Negative for depression, suicidal ideas, hallucinations and substance abuse. The patient is not nervous/anxious and does not have insomnia.   All other systems reviewed and are negative.   Blood pressure 150/81, pulse 49, temperature 97.9 F (36.6 C), temperature source Oral, resp. rate 20, height 5\' 6"  (1.676 m), weight 89.132 kg (196 lb 8 oz), SpO2 99 %.Body mass index is 31.73 kg/(m^2).  General Appearance: Casual  Eye Contact::  Good  Speech:  Clear and Coherent  Volume:  Normal  Mood:  Euphoric  Affect:  Appropriate  Thought Process:  Tangential  Orientation:  Full (Time, Place, and Person)  Thought Content:  WDL  Suicidal Thoughts:  No  Homicidal Thoughts:  No  Memory:  Immediate;   Fair Recent;   Poor Remote;   Poor  Judgement:  Impaired  Insight:  Shallow  Psychomotor Activity:  Increased  Concentration:  Poor  Recall:  [place]  Language: Fair  Akathisia:  No  Handed:  Right  AIMS (if indicated):     Assets:  Communication Skills Desire for Improvement Housing  ADL's:  Intact  Cognition: WNL  Sleep:  Number of Hours: 5   Treatment Plan Summary: Daily contact with patient to assess and evaluate symptoms and progress in treatment and Medication management   For bipolar disorder: continue olanzapine 30 mg po q  day. Continue depakote DR 500 mg po bid.  Continue haldol but will change to 4 mg qhs instead of 2 mg po bid.    Depakote evel obtained on 5/8  was >109.  Ammonia was elevated at 40.  Second  antipsychotic added due to lack of repose.  Now haldol  in addition to depakote and zyprexa.   There are many risks associated with treating her with Depakote as she suffers from a steatosis, and hepatitis C. Depakote level and LFTs will have to be followed up closely. She is not a candidate for other mood stabilizers such as lithium as she suffers from a stage III kidney failure and she is not a candidate for Tegretol as she on a multitude of other medications that can have interactions with Tegretol and Tegretol has same effects on liver as Depakote.  Depakote induced hyperammonemia: continue  lactulose 15 ml every other day daily---will recheck ammonia and depakote level this evening  EPS: continue benadryl 25 mg po qhs  For insomnia: continue using CPAP  Diabetes: will decrease metformin to 500 mg as Cr Cl is low 39. CBG will be d/c as CBG has been mostly nl.  Hypertension: continue atenolol  25 mg q day---will consider changes antihypertensives as HR is low.  Dyslipidemia the patient will be continued on Pravachol 20 mg a day  Constipation the patient will be continued on Colace 200 mg by mouth twice a day and  Lactulose.  OSA: pt not sleeping well w/o CPAP. Will order CPAP qhs  Falls: Patient does not appear to having issues with her gait however per history she has recurrence falls at home. I will place the patient on fall precautions. No issues with gait here  Diet heart healthy and carb modified  Precautions fall and every 15 minute checks  Hospitalization and status continue voluntary admission  Disposition she will return to her sister's house once stable.  SW made contact with the sister.  No issues returning there.  Sister confirms they both have poor reading skills and struggle to comply with meds.   Discharge follow-up she'll continue to follow-up with Dr. Gretel Acre in our clinic.  SW has made a referral for [facility]---pending answer from [facility] to seeif they will take her  case.  Labs HbA1c 6.9, TSH wnl , lipid panel (low HDL) and prolactin mildly elevated--Will check depakote, ammonia and LFTs on Monday pm  [facility]: they can deliver her medications for $5 and could prepare her meds in bubble packs for $1. Pt worries about her ability to afford medications.    [name], MD 12/17/2015, 1:09 PM

## 2015-12-17 NOTE — Progress Notes (Signed)
Recreation Therapy Notes  Date: 05.15.17 Time: 9:30 am Location: Craft Room  Group Topic: Self-expression  Goal Area(s) Addresses:  Patient will identify one color per emotion listed on wheel. Patient will verbalize one emotion experienced during session. Patient will be educated on other forms of self-expression.  Behavioral Response: Attentive, Interactive  Intervention: Emotion Wheel  Activity: Patients were given an emotion wheel worksheet and instructed to pick a color for each emotion listed on the wheel.   Education: LRT educated patients on other forms of self-expression.  Education Outcome: In group clarification offered  Clinical Observations/Feedback: Patient picked a color for each emotion. Patient contributed to group discussion by stating what emotion she felt while she was color. Patient recited the Lord's Prayer after group.  Leonette Monarch, LRT/CTRS 12/17/2015 10:17 AM

## 2015-12-17 NOTE — Progress Notes (Signed)
D: Patient is alert and oriented on the unit this shift. Patient attended and actively participated in groups today. Patient denies suicidal ideation, homicidal ideation, auditory or visual hallucinations at the present time.  A: Scheduled medications are administered to patient as per MD orders. Emotional support and encouragement are provided. Patient is maintained on q.15 minute safety checks. Patient is informed to notify staff with questions or concerns. R: No adverse medication reactions are noted. Patient is cooperative with medication administration and treatment plan today. Patient is receptive, anxious and cooperative on the unit at this time. Patient interacts well with others on the unit this shift. Patient contracts for safety at this time. Patient remains safe at this time.

## 2015-12-17 NOTE — Progress Notes (Signed)
Denies SI/HI/AVH.  Speech remains tangential and hyper verbal. Medication and group compliant. Support and encouragement offered.  Safety maintained.

## 2015-12-18 ENCOUNTER — Encounter: Payer: Self-pay | Admitting: Psychiatry

## 2015-12-18 MED ORDER — METOPROLOL TARTRATE 37.5 MG PO TABS: 37.5000 mg | ORAL_TABLET | Freq: Two times a day (BID) | ORAL | Status: DC

## 2015-12-18 MED ORDER — DIVALPROEX SODIUM 500 MG PO DR TAB: 500.0000 mg | DELAYED_RELEASE_TABLET | Freq: Two times a day (BID) | ORAL | Status: DC

## 2015-12-18 MED ORDER — DOCUSATE SODIUM 100 MG PO CAPS: 200.0000 mg | ORAL_CAPSULE | Freq: Two times a day (BID) | ORAL | Status: DC

## 2015-12-18 MED ORDER — DIPHENHYDRAMINE HCL 25 MG PO CAPS: 25.0000 mg | ORAL_CAPSULE | Freq: Every day | ORAL | Status: DC

## 2015-12-18 MED ORDER — HALOPERIDOL 5 MG PO TABS: 5.0000 mg | ORAL_TABLET | Freq: Every day | ORAL | Status: DC

## 2015-12-18 MED ORDER — METFORMIN HCL 500 MG PO TABS: 500.0000 mg | ORAL_TABLET | Freq: Every day | ORAL | Status: DC

## 2015-12-18 MED ORDER — LOVASTATIN 40 MG PO TABS: 40.0000 mg | ORAL_TABLET | Freq: Every day | ORAL | Status: DC

## 2015-12-18 MED ORDER — HALOPERIDOL 5 MG PO TABS
5.0000 mg | ORAL_TABLET | Freq: Every day | ORAL | Status: DC
  Administered 2015-12-18: 5 mg via ORAL
  Filled 2015-12-18: qty 1

## 2015-12-18 MED ORDER — OLANZAPINE 15 MG PO TABS: 30.0000 mg | ORAL_TABLET | Freq: Every day | ORAL | Status: DC

## 2015-12-18 NOTE — Progress Notes (Signed)
Recreation Therapy Notes  Date: 05.16.17 Time: 9:30 am Location: Craft Room  Group Topic: Coping Skills  Goal Area(s) Addresses:  Patient will participate in coping skill. Patient will verbalize an emotion experienced during group.  Behavioral Response: Attentive, Interactive  Intervention: Coloring  Activity: Patients colored coloring sheets and were encouraged to think about the emotions they were feeling while they were coloring.  Education: LRT educated patients on healthy coping skills.  Education Outcome: In group clarification offered.   Clinical Observations/Feedback: Patient attended group and colored coloring sheet. LRT had to redirect patient a few times during group discussion. Patient contributed to group discussion by stating what emotion he felt. Patient interacted with peer.  Leonette Monarch, LRT/CTRS 12/18/2015 10:30 AM

## 2015-12-18 NOTE — Discharge Summary (Addendum)
Physician Discharge Summary Note  Patient:  Gina [name] is an 65 y.o., female MRN:  [id number] DOB:  [birth year] Patient phone:  [phone] (home)  Patient address:   [place] [address],  Total Time spent with patient: 45 minutes  Date of Admission:  12/03/2015 Date of Discharge: 12/19/15  Reason for Admission:  mania  Principal Problem: Bipolar disorder, manic, moderate ([place]) Discharge Diagnoses: Patient Active Problem List   Diagnosis Date Noted  . Bipolar disorder, manic, moderate ([place]) [F31.12] 12/03/2015  . NASH (nonalcoholic steatohepatitis) [K75.81] 03/28/2015  . Gout [M10.9] 03/27/2015  . HCV antibody positive [R89.4] 03/27/2015  . Uncontrolled type 2 diabetes mellitus with nephropathy ([place]) [E11.21, E11.65] 06/07/2014  . Hyperlipidemia [E78.5]   . IDA (iron deficiency anemia) [D50.9] 01/26/2013  . OSA on CPAP [G47.33]   . CKD (chronic kidney disease) stage 3, GFR 30-59 ml/min [N18.3]   . Obesity, Class II, BMI 35-39.9, with comorbidity ([place]) [E66.01]   . HTN (hypertension), benign [I10] 04/07/2012   History of Present Illness:  65 year old woman with bipolat disorder type I presented to the emergency room with worsening symptoms of mania on 5/21. Patient is a poor historian. She tells me that she has not been able to sleep at night. This is been going on for weeks and is getting worse. She describes her mood as being "excited". She says that she has been taking her medicines as prescribed but she believes that what she is getting is not the best medicine for her. Later on she said at times she was getting confused with her pill box. Thinks that spirits might be "messing with her". She denies suicidal or homicidal ideation. She denies hallucinations. She denies suicidal thoughts.   Per ER physician notes: she has not slept in 3 days and is feeling agitated. She's not sure what medication she is on and if she is taking them correctly.She  does report "I can work miracles" like "multiplying hearts". She also cites prior as a miracle that she works. She makes frequent religious references.  During assessment pt was hyperverval and very loud, her mood was labile ranging from euphoric to irritable. Thought process was tangential and very difficult to follow.  Patient continues to follow up with [facility]. Currently seen Dr. Gretel Acre there.  Hospitalized here in 2015 due to mania, also hospitalized last year at [facility] for same reason.  Patient lives with her sister but says her sister is having a lot health problems. Patient states that her sister drives her to all her appointments but lately she is having dizziness and is no longer able to drive.   Substance abuse history: Denies ever having any alcohol or drug problems.  Trauma: this was not explore at this time as she is manic  Associated Signs/Symptoms: Depression Symptoms: insomnia, loss of energy/fatigue, decreased appetite, (Hypo) Manic Symptoms: Delusions, Distractibility, Elevated Mood, Impulsivity, Labiality of Mood, Anxiety Symptoms: Excessive Worry, Psychotic Symptoms: Delusions, able to make miracles PTSD Symptoms: NA   Past Psychiatric History: Patient carries a diagnosis of bipolar disorder type I. She was in our unit back in 2015 with a mild-to-moderate manic episode. She was discharged on Risperdal and Depakote ER 1500 mg daily at bedtime. Looks a she was hospitalized on [place] in August 2016 for a manic episode. During her hospitalization on [place] help looks like Depakote was discontinued and the patient was restarted on Tegretol. The patient appears to be that the patient had elevated LFTs and  thrombocytopenia. Per not from her outpatient psychiatrist at [facility] degree towards never continue. The patient has been in mind obtained on olanzapine 20mg  since at least August 2016.  No history of self  injury or suicidal attempts.  Past psychiatric history: Patient has a multitude of chronic conditions. She suffers from diabetes, hypertension, dyslipidemia, stage III chronic renal failure, hepatic steatosis, patient also suffers from hepatitis C, has been diagnosed with obstructive sleep apnea and uses a CPAP at home. She is being diagnosed with 3 different types of cancer in her early 74s and 70s. She's been diagnosed with uterine cancer for which she had a hysterectomy which, she was diagnosed with bladder cancer and with colon cancer in the past. She has had genetic testing and no genetic predisposition for cancer was found. Patient follows up with a lot of our clinic family medicine for primary care her primary care doctor is Ria Bush, she [facility] up [facility] [name], she follows up with Dr. Melrose Nakayama from [facility] to clinic neurology for memory problems.   Past Medical History:  Past Medical History  Diagnosis Date  . Bipolar depression [facility])     sees psychiatrist - psych admission 03/2015  . DJD (degenerative joint disease), lumbar     chronic lower back pain  . Obesity (BMI 30-39.9)   . History of colon cancer     s/p surgery  . 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 OSA on CPAP     6cm H2O  . RBBB   . Anemia in chronic kidney disease   . Blood transfusion without reported diagnosis 2009  . Cataract     left  . GERD (gastroesophageal reflux disease)   . Hyperlipidemia   . Hypertension   . History of bladder cancer 1997  . History of uterine cancer     s/p hysterectomy  . CKD (chronic kidney disease) stage 3, GFR 30-59 ml/min   . Personal history of colonic adenomas and colon cancer 11/11/2012  . IDA (iron deficiency anemia) 01/2013    thought due to h/o polyps  . Positive hepatitis C antibody test 09/2014    but negative confirmatory testing  . Colon cancer  ([place]) 1990's  . Family history of breast cancer   . Family history of colon cancer   . Family history of stomach cancer   . Uncontrolled type 2 diabetes mellitus with nephropathy ([place]) 06/07/2014    [facility] 11/2015    Past Surgical History  Procedure Laterality Date  . Hernia repair  02/04/12  . Partial colectomy  about 2008    for colon cancer  . I & d abdominal wound  02/19/12  . Removal of infected mesh and abdominal wound vac placement  02/24/12  . Reexploration of abdominal wound and allograft placemet  02/26/12  . Partial hysterectomy  1981    uterine cancer, R ovary remains  . Left oophorectomy  2005  . Colonoscopy  07/2009  . Dexa  12/2009    WNL  . Dobutamine stress echo  12/2009    no evidence of ischemia  . Colonoscopy  11/2012    2 tubular adenomas, mild diverticulosis, pending genetic testing for Lynch syndrome [name]) rpt 2 yrs  . Sleep study  02/2014    OSA - AHI 55, nadir 81% [name])  . Breast biopsy Right 01/2014    fibroadenoma  . Colonoscopy  02/2015    TA, diverticulosis, rpt 2 yrs [name])   Family History:  Family History  Problem Relation Age of Onset  . Colon cancer Mother 57  . Stomach cancer Mother     dx in her 76s?  Marland Kitchen Colon cancer Sister 65    Maternal half sister  . CAD Father     MI  . Diabetes Other     aunts and uncles both sides  . Arthritis Other     strong fmhx  . Mental illness Sister     anxiety/depression  . Breast cancer Maternal Grandmother   . Esophageal cancer Neg Hx   . Rectal cancer Neg Hx    Family Psychiatric  History: Sister with depression after loss of husband. Denies any family hx of substance abuse or suicide.  Social History: Patient is single, never married, doesn't have any children. She has [employer]. She is on disability for bipolar disorder. She is currently living with her sister and her sister's son. She is originally from [place]. Has been here in [place] for several years.  History   Alcohol Use No     History  Drug Use No    Social History   Social History  . Marital Status: Single    Spouse Name: N/A  . Number of Children: 0  . Years of Education: N/A   Social History Main Topics  . Smoking status: Never Smoker   . Smokeless tobacco: Never Used  . Alcohol Use: No  . Drug Use: No  . Sexual Activity: No   Other Topics Concern  . None   Social History Narrative   Lives with sister, no pets   Occupation: disabled, for bipolar and arthritis   Edu: GED   Activity: take walks   Diet: good water, vegetables daily   Religion: [place], Dr. Jimmye Norman (ph [phone])    Hospital Course:    For bipolar disorder: continue olanzapine 30 mg po q day. Continue depakote DR 500 mg po bid.  Continue haldol  5 mg po qhs.  Depakote level on 5/15 79.  Amonia wa 10 and LFT's wnl    Second antipsychotic added  (haldol) due to lack of repose to olanzapine and depakote.    There are many risks associated with treating her with Depakote as she suffers from a steatosis, and hepatitis C. Depakote level and LFTs will have to be followed up closely. She is not a candidate for other mood stabilizers such as lithium as she suffers from a stage III kidney failure and she is not a candidate for Tegretol as she on a multitude of other medications that can have interactions with Tegretol and Tegretol has same effects on liver as Depakote.  Depakote induced hyperammonemia: resolved, ammonia 10.  Continue lactulose every other day.  EPS: continue benadryl 25 mg po qhs  For insomnia: continue using CPAP  Diabetes: will decrease metformin to 500 mg as Cr Cl is low 39.   Hypertension: continue metoprolol 37.5 mg po bid  Dyslipidemia the patient will be continued on Pravachol 20 mg a day  Constipation the patient will be continued on Colace 200 mg by mouth twice a day and  Lactulose every other day. Also on miralax prn  OSA: pt not  sleeping well w/o CPAP. Will order CPAP qhs  Falls: Patient does not appear to having issues with her gait however per history she has  recurrence falls at home. I will place the patient on fall precautions. No issues with gait here  Diet heart healthy and carb modified    Social Issues:  -Disposition she will return to her sister's house today.  SW made contact with the sister.  No issues returning there.  Sister confirms they both have poor reading skills and struggle to comply with meds.   -Poor compliance with medications: [facility] they can deliver her medications for $5 and could prepare her meds in bubble packs for $1. All prescriptions have been sent electronically to [facility]. I spoke with the pharmacist on 5/16. Pharmacist has agreed to deliver the medications to the patient on Thursday at noon (5/18). Pharmacist tells me that the patient in the past has been very confused and that they were really concerned about her ability to care for herself. They report that in the past she gets very confused about medication and misses doses frequently. Pharmacy has received an updated list of all her medications (faxed to [facility] on 5/16).  I will Route the discharge summary to the patient's primary care Dr. Danise Mina.   All of her medications have been made BID with breakfast and supper in order to simplify schedule for the patient.  Unfortunately her insurance (medicare) does not cover for group home or ALF placement.  -Discharge follow-up: SW has made a referral for [facility]---ACT team has accepted her case.  ACT team can visit pt home (frequency of visits depends on the pt, most likely Mrs Frederking will be seen by then once q week).   Pt is also to f/u with [facility] ---Dr Danise Mina (discharge summary will be routed to him)   Family meeting held on 5/17. Pt's sister Stanton Kidney, [place] and I were present.  All of her home meds were reviewed and placed in  plastic bag.  They were instructed to give them to her pharmacist for then to be disposed.  [facility].  Free meds were given for tonight.  F/u plan was dicussed with them.  On discharge pt reported feeling better.  Denies SI, HI or hallucinations.  Denies SE from medications or physical complaints.    Physical Findings: AIMS: Facial and Oral Movements Muscles of Facial Expression: None, normal Lips and Perioral Area: None, normal Jaw: None, normal Tongue: None, normal,Extremity Movements Upper (arms, wrists, hands, fingers): None, normal Lower (legs, knees, ankles, toes): None, normal, Trunk Movements Neck, shoulders, hips: None, normal, Overall Severity Severity of abnormal movements (highest score from questions above): None, normal Incapacitation due to abnormal movements: None, normal Patient's awareness of abnormal movements (rate only patient's report): No Awareness, Dental Status Current problems with teeth and/or dentures?: No Does patient usually wear dentures?: No  CIWA:    COWS:     Musculoskeletal: Strength & Muscle Tone: within normal limits Gait & Station: normal Patient leans: N/A  Psychiatric Specialty Exam: Review of Systems  Constitutional: Negative.   HENT: Negative.   Eyes: Negative.   Respiratory: Negative.   Cardiovascular: Negative.   Gastrointestinal: Negative.   Genitourinary: Negative.   Musculoskeletal: Negative.   Skin: Negative.   Neurological: Negative.   Endo/Heme/Allergies: Negative.   Psychiatric/Behavioral: Negative.     Blood pressure 151/62, pulse 49, temperature 98 F (36.7 C), temperature source Oral, resp. rate 20, height 5\' 6"  (1.676 m), weight 89.132 kg (196 lb 8 oz), SpO2 99 %.Body mass index is 31.73 kg/(m^2).  General Appearance:  Fairly Groomed  Patent attorney::  Good  Speech:  loud not  As pressure  Volume:  Increased  Mood:  Euphoric mild  Affect:  Congruent  Thought Process:   circumstantial  Orientation:  Full (Time, Place, and Person)  Thought Content:  Hallucinations: None  Suicidal Thoughts:  No  Homicidal Thoughts:  No  Memory:  Immediate;   Fair Recent;   Fair Remote;   Fair  Judgement:  Fair  Insight:  Fair  Psychomotor Activity:  Normal  Concentration:  Fair  Recall:  Fair  Fund of Knowledge:Fair  Language: Good  Akathisia:  No  Handed:    AIMS (if indicated):     Assets:  Communication Skills Social Support  ADL's:  Intact  Cognition: WNL  Sleep:  Number of Hours: 5.5   Have you used any form of tobacco in the last 30 days? (Cigarettes, Smokeless Tobacco, Cigars, and/or Pipes): No  Has this patient used any form of tobacco in the last 30 days? (Cigarettes, Smokeless Tobacco, Cigars, and/or Pipes) Yes, No  Blood Alcohol level:  Lab Results  Component Value Date   [facility] <5 12/03/2015   ETH <5 11/20/2015    Metabolic Disorder Labs:  Lab Results  Component Value Date   HGBA1C 6.9* 12/04/2015   MPG 114 04/14/2012   Lab Results  Component Value Date   PROLACTIN 33.6* 12/04/2015   Lab Results  Component Value Date   CHOL 111 12/04/2015   TRIG 103 12/04/2015   HDL 38* 12/04/2015   CHOLHDL 2.9 12/04/2015   VLDL 21 12/04/2015   LDLCALC 52 12/04/2015   LDLCALC 92 08/28/2015    See Psychiatric Specialty Exam and Suicide Risk Assessment completed by Attending Physician prior to discharge.  Discharge destination:  Home  Is patient on multiple antipsychotic therapies at discharge:  Yes,   Do you recommend tapering to monotherapy for antipsychotics?  No   Has Patient had three or more failed trials of antipsychotic monotherapy by history:  Yes,   Antipsychotic medications that previously failed include:   1.  risperdal., 2.  olanzapine. and 3.  haldol.  Recommended Plan for Multiple Antipsychotic Therapies: NA     Medication List    STOP taking these medications        azelastine 0.1 % nasal spray  Commonly known as:  ASTELIN      clonazePAM 0.5 MG tablet  Commonly known as:  KLONOPIN     lamoTRIgine 25 MG tablet  Commonly known as:  LAMICTAL     OLANZapine zydis 10 MG disintegrating tablet  Commonly known as:  ZYPREXA  Replaced by:  OLANZapine 15 MG tablet      TAKE these medications      Indication   diphenhydrAMINE 25 mg capsule  Commonly known as:  BENADRYL  Take 1 capsule (25 mg total) by mouth daily with supper.  Notes to Patient:  Plan side effects from Haldol and Zyprexa      divalproex 500 MG DR tablet  Commonly known as:  DEPAKOTE  Take 1 tablet (500 mg total) by mouth 2 (two) times daily with a meal.  Notes to Patient:  Bipolar      docusate sodium 100 MG capsule  Commonly known as:  COLACE  Take 2 capsules (200 mg total) by mouth 2 (two) times daily.  Notes to Patient:  Constipation      haloperidol 5 MG tablet  Commonly known as:  HALDOL  Take 1 tablet (5 mg total)  by mouth daily with supper.  Notes to Patient:  Bipolar      lovastatin 40 MG tablet  Commonly known as:  MEVACOR  Take 1 tablet (40 mg total) by mouth daily with supper.  Notes to Patient:  Cholesterol      metFORMIN 500 MG tablet  Commonly known as:  GLUCOPHAGE  Take 1 tablet (500 mg total) by mouth daily with breakfast.  Notes to Patient:  Diabetes      Metoprolol Tartrate 37.5 MG Tabs  Take 37.5 mg by mouth 2 (two) times daily with a meal.  Notes to Patient:  Blood pressure      OLANZapine 15 MG tablet  Commonly known as:  ZYPREXA  Take 2 tablets (30 mg total) by mouth daily with supper.  Notes to Patient:  Bipolar      polyethylene glycol packet  Commonly known as:  MIRALAX / GLYCOLAX  Take 17 g by mouth daily as needed for moderate constipation.  Notes to Patient:  constipation        Follow-up Information    Go to Ria Bush, MD.   Specialty:  Family Medicine   Why:  follow up with your primary care doctor   Contact information:   [place]  [id number] [phone]       Follow up with [facility] On 12/25/2015.   Why:  ACTT will visit you at home for an intake appointment on Tuesday, May 23rd at 11:00am.    Contact information:   [address]  [place], [place] [address] Phone: [phone] Fax: [phone]     >30 minutes. >50 % of the time was spent in coordination of care.  Signed: [name], MD 12/19/2015, 2:14 PM

## 2015-12-18 NOTE — [facility] Group Notes (Signed)
[facility] Group Notes:  (Nursing/MHT/Case Management/Adjunct)  Date:  12/18/2015  Time:  2:28 PM  Type of Therapy:  Psychoeducational Skills  Participation Level:  Active  Participation Quality:  Appropriate  Affect:  Appropriate  Cognitive:  Appropriate  Insight:  Appropriate  Engagement in Group:  Engaged  Modes of Intervention:  Discussion and Education  Summary of Progress/Problems:  Gina [name] 12/18/2015, 2:28 PM

## 2015-12-18 NOTE — Progress Notes (Signed)
Denies SI/HI/AVH.  Intrusive at times.  Speech remains tangential.  Support and encouragement offered, safety maintained.

## 2015-12-18 NOTE — Progress Notes (Addendum)
[facility] MD Progress Note  12/18/2015 9:10 AM Gina [name]  MRN:  [id number]  Subjective:    At exam she is less tangential, needs less redirection. Speech is not as loud or as pressure.    Per staff she continues to attend groups but continues to be hyperverbal and hyperreligious.  Gina [name] usually has a multitude of somatic complaints. Today she told me she was able to sleep a little bit better however she woke up at 2:00 in the morning and later on she was able to fall back asleep. She no longer is feels drowsy. She feels "hyper" but at the same time she feels a little bit better. She continues to complain of "gas pains".   We called [place] today. Pt confirmed her address and willingness to pay for bubble packs and delivery.    Per Nursing:  Patient is alert and oriented on the unit this shift. Patient attended and actively participated in groups today. Patient denies suicidal ideation, homicidal ideation, auditory or visual hallucinations at the present time.   Principal Problem: Bipolar disorder, manic, moderate ([place]) Diagnosis:   Patient Active Problem List   Diagnosis Date Noted  . Bipolar disorder, manic, moderate ([place]) [F31.12] 12/03/2015  . NASH (nonalcoholic steatohepatitis) [K75.81] 03/28/2015  . Gout [M10.9] 03/27/2015  . HCV antibody positive [R89.4] 03/27/2015  . Uncontrolled type 2 diabetes mellitus with nephropathy ([place]) [E11.21, E11.65] 06/07/2014  . Hyperlipidemia [E78.5]   . IDA (iron deficiency anemia) [D50.9] 01/26/2013  . OSA on CPAP [G47.33]   . CKD (chronic kidney disease) stage 3, GFR 30-59 ml/min [N18.3]   . Obesity, Class II, BMI 35-39.9, with comorbidity ([place]) [E66.01]   . HTN (hypertension), benign [I10] 04/07/2012   Total Time spent with patient: 30 minutes  Past Psychiatric History: Bipolar disorder.  Past Medical History:  Past Medical History  Diagnosis Date  . Bipolar depression [facility])     sees psychiatrist - psych  admission 03/2015  . DJD (degenerative joint disease), lumbar     chronic lower back pain  . Obesity (BMI 30-39.9)   . History of colon cancer     s/p surgery  . 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 OSA on CPAP     6cm H2O  . RBBB   . Anemia in chronic kidney disease   . Blood transfusion without reported diagnosis 2009  . Cataract     left  . GERD (gastroesophageal reflux disease)   . Hyperlipidemia   . Hypertension   . History of bladder cancer 1997  . History of uterine cancer     s/p hysterectomy  . CKD (chronic kidney disease) stage 3, GFR 30-59 ml/min   . Personal history of colonic adenomas and colon cancer 11/11/2012  . IDA (iron deficiency anemia) 01/2013    thought due to h/o polyps  . Positive hepatitis C antibody test 09/2014    but negative confirmatory testing  . Colon cancer ([place]) 1990's  . Family history of breast cancer   . Family history of colon cancer   . Family history of stomach cancer   . Uncontrolled type 2 diabetes mellitus with nephropathy ([place]) 06/07/2014    [facility] DSME 11/2015    Past Surgical History  Procedure Laterality Date  . Hernia repair  02/04/12  . Partial colectomy  about 2008    for colon cancer  . I & d abdominal wound  02/19/12  . Removal of infected mesh  and abdominal wound vac placement  02/24/12  . Reexploration of abdominal wound and allograft placemet  02/26/12  . Partial hysterectomy  1981    uterine cancer, R ovary remains  . Left oophorectomy  2005  . Colonoscopy  07/2009  . Dexa  12/2009    WNL  . Dobutamine stress echo  12/2009    no evidence of ischemia  . Colonoscopy  11/2012    2 tubular adenomas, mild diverticulosis, pending genetic testing for Lynch syndrome [name]) rpt 2 yrs  . Sleep study  02/2014    OSA - AHI 55, nadir 81% [name])  . Breast biopsy Right 01/2014    fibroadenoma  . Colonoscopy  02/2015    TA, diverticulosis, rpt 2 yrs [name])   Family History:  Family History   Problem Relation Age of Onset  . Colon cancer Mother 25  . Stomach cancer Mother     dx in her 53s?  Marland Kitchen Colon cancer Sister 67    Maternal half sister  . CAD Father     MI  . Diabetes Other     aunts and uncles both sides  . Arthritis Other     strong fmhx  . Mental illness Sister     anxiety/depression  . Breast cancer Maternal Grandmother   . Esophageal cancer Neg Hx   . Rectal cancer Neg Hx    Family Psychiatric  History: See H&P. Social History:  History  Alcohol Use No     History  Drug Use No    Social History   Social History  . Marital Status: Single    Spouse Name: N/A  . Number of Children: 0  . Years of Education: N/A   Social History Main Topics  . Smoking status: Never Smoker   . Smokeless tobacco: Never Used  . Alcohol Use: No  . Drug Use: No  . Sexual Activity: No   Other Topics Concern  . None   Social History Narrative   Lives with sister, no pets   Occupation: disabled, for bipolar and arthritis   Edu: GED   Activity: take walks   Diet: good water, vegetables daily   Religion: Catholic      Psych - [employer], Dr. Jimmye Norman (ph [phone])    Current Medications: Current Facility-Administered Medications  Medication Dose Route Frequency Provider Last Rate Last Dose  . acetaminophen (TYLENOL) tablet 650 mg  650 mg Oral Q6H PRN [name], MD   650 mg at 12/05/15 1253  . alum & mag hydroxide-simeth (MAALOX/MYLANTA) 200-200-20 MG/5ML suspension 30 mL  30 mL Oral Q4H PRN [name], MD   30 mL at 12/16/15 0703  . diphenhydrAMINE (BENADRYL) capsule 25 mg  25 mg Oral QPC supper [name], MD   25 mg at 12/17/15 1755  . divalproex (DEPAKOTE) DR tablet 500 mg  500 mg Oral BID WC [name], MD   500 mg at 12/18/15 2956  . docusate sodium (COLACE) capsule 200 mg  200 mg Oral BID WC [name], MD   200 mg at 12/18/15 2130  . haloperidol (HALDOL) tablet 4 mg  4 mg Oral QPC supper  [name], MD   4 mg at 12/17/15 1755  . lactulose (CHRONULAC) 10 GM/15ML solution 10 g  10 g Oral QODAY [name], MD   10 g at 12/16/15 0941  . magnesium hydroxide (MILK OF MAGNESIA) suspension 30 mL  30 mL Oral Daily PRN Jenny Reichmann T  Clapacs, MD   30 mL at 12/17/15 0839  . metFORMIN (GLUCOPHAGE) tablet 500 mg  500 mg Oral Q breakfast [name], MD   500 mg at 12/18/15 2585  . metoprolol tartrate (LOPRESSOR) tablet 25 mg  25 mg Oral BID [name], MD      . OLANZapine (ZYPREXA) tablet 30 mg  30 mg Oral QPC supper [name], MD   30 mg at 12/17/15 1755  . polyethylene glycol (MIRALAX / GLYCOLAX) packet 17 g  17 g Oral Daily PRN [name], MD      . pravastatin (PRAVACHOL) tablet 20 mg  20 mg Oral q1800 [name], MD   20 mg at 12/17/15 1755  . white petrolatum (VASELINE) gel   Topical PRN [name], MD        Lab Results:  Results for orders placed or performed during the hospital encounter of 12/03/15 (from the past 48 hour(s))  Valproic acid level     Status: None   Collection Time: 12/17/15  8:14 PM  Result Value Ref Range   Valproic Acid Lvl 79 50.0 - 100.0 ug/mL  Ammonia     Status: None   Collection Time: 12/17/15  8:14 PM  Result Value Ref Range   Ammonia 10 9 - 35 umol/L  Comprehensive metabolic panel     Status: Abnormal   Collection Time: 12/17/15  8:14 PM  Result Value Ref Range   Sodium 135 135 - 145 mmol/L   Potassium 4.6 3.5 - 5.1 mmol/L   Chloride 97 (L) 101 - 111 mmol/L   CO2 31 22 - 32 mmol/L   Glucose, Bld 100 (H) 65 - 99 mg/dL   BUN 33 (H) 6 - 20 mg/dL   Creatinine, Ser 1.56 (H) 0.44 - 1.00 mg/dL   Calcium 9.0 8.9 - 10.3 mg/dL   Total Protein 6.7 6.5 - 8.1 g/dL   Albumin 3.9 3.5 - 5.0 g/dL   AST 33 15 - 41 U/L   ALT 32 14 - 54 U/L   Alkaline Phosphatase 94 38 - 126 U/L   Total Bilirubin 0.7 0.3 - 1.2 mg/dL   GFR calc non Af Amer 34 (L) >60 mL/min    GFR calc Af Amer 39 (L) >60 mL/min    Comment: (NOTE) The eGFR has been calculated using the CKD EPI equation. This calculation has not been validated in all clinical situations. eGFR's persistently <60 mL/min signify possible Chronic Kidney Disease.    Anion gap 7 5 - 15    Blood Alcohol level:  Lab Results  Component Value Date   ETH <5 12/03/2015   ETH <5 11/20/2015    Musculoskeletal: Strength & Muscle Tone: within normal limits Gait & Station: normal Patient leans: N/A  Psychiatric Specialty Exam: Review of Systems  Constitutional: Negative.   HENT: Negative.   Eyes: Negative.   Respiratory: Negative.   Cardiovascular: Negative.   Gastrointestinal: Positive for constipation. Negative for nausea, vomiting, abdominal pain and diarrhea.  Genitourinary: Negative.   Musculoskeletal: Negative.   Skin: Negative.   Neurological: Negative.  Negative for dizziness.  Endo/Heme/Allergies: Negative.   Psychiatric/Behavioral: Positive for memory loss. Negative for depression, suicidal ideas, hallucinations and substance abuse. The patient is not nervous/anxious and does not have insomnia.   All other systems reviewed and are negative.   Blood pressure 131/58, pulse 62, temperature 98.3 F (36.8 C), temperature source Oral, resp. rate 20, height _0  (1.676 m), weight 89.132 kg (196 lb 8 oz),  SpO2 99 %.Body mass index is 31.73 kg/(m^2).  General Appearance: Casual  Eye Contact::  Good  Speech:  Clear and Coherent  Volume:  Normal  Mood:  Euphoric  Affect:  Appropriate  Thought Process:  Tangential  Orientation:  Full (Time, Place, and Person)  Thought Content:  WDL  Suicidal Thoughts:  No  Homicidal Thoughts:  No  Memory:  Immediate;   Fair Recent;   Poor Remote;   Poor  Judgement:  Impaired  Insight:  Shallow  Psychomotor Activity:  Increased  Concentration:  Poor  Recall:  [place]  Language: Fair  Akathisia:  No  Handed:  Right  AIMS (if  indicated):     Assets:  Communication Skills Desire for Improvement Housing  ADL's:  Intact  Cognition: WNL  Sleep:  Number of Hours: 7.15   Treatment Plan Summary: Daily contact with patient to assess and evaluate symptoms and progress in treatment and Medication management   For bipolar disorder: continue olanzapine 30 mg po q day. Continue depakote DR 500 mg po bid.  Continue haldol but will change to 5 mg po qhs (pt slept better last night with this change).  Depakote level on 5/15 79.  Amonia wa 10 and LFT's wnl    Second antipsychotic added due to lack of repose.  Now haldol  in addition to depakote and zyprexa.   There are many risks associated with treating her with Depakote as she suffers from a steatosis, and hepatitis C. Depakote level and LFTs will have to be followed up closely. She is not a candidate for other mood stabilizers such as lithium as she suffers from a stage III kidney failure and she is not a candidate for Tegretol as she on a multitude of other medications that can have interactions with Tegretol and Tegretol has same effects on liver as Depakote.  Depakote induced hyperammonemia: resolved, ammonia 10.  Continue lactulose every other day.  EPS: continue benadryl 25 mg po qhs  For insomnia: continue using CPAP  Diabetes: will decrease metformin to 500 mg as Cr Cl is low 39. CBG will be d/c as CBG has been mostly nl.  Hypertension: continue metoprolol 25 mg po bid (HR 69 ), bradycardia resolved after d/c atenolol  Dyslipidemia the patient will be continued on Pravachol 20 mg a day  Constipation the patient will be continued on Colace 200 mg by mouth twice a day and  Lactulose.  OSA: pt not sleeping well w/o CPAP. Will order CPAP qhs  Falls: Patient does not appear to having issues with her gait however per history she has recurrence falls at home. I will place the patient on fall precautions. No issues with gait here  Diet heart healthy and carb  modified  Precautions fall and every 15 minute checks  Hospitalization and status continue voluntary admission  Disposition she will return to her sister's house once stable.  SW made contact with the sister.  No issues returning there.  Sister confirms they both have poor reading skills and struggle to comply with meds.   Discharge follow-up: SW has made a referral for [facility]---[facility] has accepted her case  Labs HbA1c 6.9, TSH wnl , lipid panel (low HDL) and prolactin mildly elevated  Contacted [facility]: they can deliver her medications for $5 and could prepare her meds in bubble packs for $1. [facility] I spoke with the pharmacist today. They have agreed  to deliver the medications to the patient on Thursday at noon. Pharmacist tells me that the patient in the past has been very confused and that they were really concerned about her ability to care for herself. They report that in the past she gets very confused about medication and misses doses frequently. Pharmacy has received an updated list of all her medications (faxed to [facility] on 5/16).  I will Route the discharge summary to the patient's primary care Dr. Danise Mina.   Plan to have a family meeting with pt's sister prior to discharge tomorrow   [name], MD 12/18/2015, 9:10 AM

## 2015-12-18 NOTE — BHH Suicide Risk Assessment (Signed)
[facility] Discharge Suicide Risk Assessment   Principal Problem: Bipolar disorder, manic, moderate ([place]) Discharge Diagnoses:  Patient Active Problem List   Diagnosis Date Noted  . Bipolar disorder, manic, moderate ([place]) [F31.12] 12/03/2015  . NASH (nonalcoholic steatohepatitis) [K75.81] 03/28/2015  . Gout [M10.9] 03/27/2015  . HCV antibody positive [R89.4] 03/27/2015  . Uncontrolled type 2 diabetes mellitus with nephropathy ([place]) [E11.21, E11.65] 06/07/2014  . Hyperlipidemia [E78.5]   . IDA (iron deficiency anemia) [D50.9] 01/26/2013  . OSA on CPAP [G47.33]   . CKD (chronic kidney disease) stage 3, GFR 30-59 ml/min [N18.3]   . Obesity, Class II, BMI 35-39.9, with comorbidity ([place]) [E66.01]   . HTN (hypertension), benign [I10] 04/07/2012     Psychiatric Specialty Exam: ROS  Blood pressure 151/62, pulse 49, temperature 98 F (36.7 C), temperature source Oral, resp. rate 20, height 5\' 6"  (1.676 m), weight 89.132 kg (196 lb 8 oz), SpO2 99 %.Body mass index is 31.73 kg/(m^2).                                                       Mental Status Per Nursing Assessment::   On Admission:  NA  Demographic Factors:  Caucasian  Loss Factors: NA  Historical Factors: Impulsivity  Risk Reduction Factors:   Sense of responsibility to family, Living with another person, especially a relative and Positive social support  Continued Clinical Symptoms:  Previous Psychiatric Diagnoses and Treatments  Cognitive Features That Contribute To Risk:  None    Suicide Risk:  Minimal: No identifiable suicidal ideation.  Patients presenting with no risk factors but with morbid ruminations; may be classified as minimal risk based on the severity of the depressive symptoms  [name], MD 12/19/2015, 7:15 AM

## 2015-12-18 NOTE — BHH Group Notes (Signed)
[facility] LCSW Aftercare Discharge Planning Group Note   12/18/2015 10:52 AM  Participation Quality:  Patient attended and participated in group introducing herself and sharing her SMART goal is "to get clothes and medications for discharge". Patient reports she does not want to discharge because she is happy here and is finally getting some sleep. Patient received a daily workbook for Tuesday.  Mood/Affect:  Appropriate  Depression Rating:  Patient reports her depression is low but not able to put a number to her rating  Anxiety Rating:  Patient was not able to put a number to her rating but reports she has a little anxiety about going home  Thoughts of Suicide:  No Will you contract for safety?   NA  Current AVH:  No  Plan for Discharge/Comments:  Home with sister and has outpatient provider  Transportation Means:  Sister will pick up  Supports: sister and outpatient provider and family.  Keene Breath, MSW, LCSW

## 2015-12-18 NOTE — BHH Group Notes (Signed)
[facility] LCSW Group Therapy  12/18/2015 8:53 AM  Type of Therapy:  Group Therapy  Participation Level:  Active  Participation Quality:  Attentive, Intrusive, Monopolizing and Redirectable  Affect:  Excited, slightly manic  Cognitive:  Disorganized  Insight:  Improving  Engagement in Therapy:  Engaged  Modes of Intervention:  Discussion, Socialization and Support  Summary of Progress/Problems: Patient attended and participated during group introducing herself and sharing during an introductory exercise that if she were an animal, she would be "Snoopy because he howls". Patient was hyper-verbal but redirectable during group but offered support to other group members during group discussion. Patient was attentive throughout group discussion.   Keene Breath, MSW, LCSW 12/18/2015, 8:53 AM

## 2015-12-19 NOTE — Progress Notes (Signed)
D: Pt denies SI/HI/AVH, affect is flat but brightens upon approach.Thoughts are tangential with some periods of confusion noted to situation. Pt appears less anxious and she is interacting with peers and staff appropriately.  A: Pt was offered support and encouragement. Pt was given scheduled medications. Pt was encouraged to attend groups. Q 15 minute checks were done for safety.  R:Pt attends groups and interacts well with peers and staff. Pt is taking medication. Pt has no complaints.Pt receptive to treatment and safety maintained on unit.

## 2015-12-19 NOTE — Progress Notes (Signed)
Recreation Therapy Notes  Date: 05.17.17 Time: 9:30 am Location: Craft Room  Group Topic: Coping Skills  Goal Area(s) Addresses:  Patient will participate in healthy coping skill. Patient will verbalize what they are focused on while coloring.  Behavioral Response: Attentive, Left early  Intervention: Coloring  Activity: Patients were given coloring sheets to color and were instructed to think about what they were focused on while they were coloring.  Education: LRT educated patients on healthy coping skills.  Education Outcome: Patient left before LRT educated group.   Clinical Observations/Feedback: Patient colored coloring sheet. Patient passed some gas due to her medications and said "excuse me". Patient left group at approximately 10:12 am with Education officer, museum. Patient did not return to group.  Leonette Monarch, LRT/CTRS 12/19/2015 10:24 AM

## 2015-12-19 NOTE — Progress Notes (Signed)
  [facility] Adult Case Management Discharge Plan :  Will you be returning to the same living situation after discharge:  Yes,  home  At discharge, do you have transportation home?: Yes,  sister  Do you have the ability to pay for your medications: Yes,  income, insurance   Release of information consent forms completed and in the chart;  Patient's signature needed at discharge.  Patient to Follow up at: Follow-up Information    Go to Ria Bush, MD.   Specialty:  Family Medicine   Why:  follow up with your primary care doctor   Contact information:   [place] [address] [phone]       Follow up with [facility] On 12/25/2015.   Why:  [facility] will visit you at home for an intake appointment on Tuesday, May 23rd at 11:00am.    Contact information:   [address]  [place], [place] [address] Phone: [phone] Fax: [phone]      Next level of care provider has access to [place] and Suicide Prevention discussed: Yes,  with patient and sister   Have you used any form of tobacco in the last 30 days? (Cigarettes, Smokeless Tobacco, Cigars, and/or Pipes): No  Has patient been referred to the Quitline?: N/A patient is not a smoker  Patient has been referred for addiction treatment: [place] MSW, [place]  12/19/2015, 8:58 AM

## 2015-12-19 NOTE — Progress Notes (Addendum)
Patient with appropriate affect, cooperative behavior with meals, meds and plan of care. No SI/HI at this time. Patient with good adls. Actively participating in plan of care. Patient sister to arrive for family meeting at 1000 am and patient to discharge at that time. Safety maintained. Patient meets with Social worker, MD and sister for family meeting rt discharge. Acknowledges return of all belongings and verbalizes understanding rt recommended plan of care. Safety maintained.

## 2015-12-19 NOTE — Tx Team (Signed)
Interdisciplinary Treatment Plan Update (Adult)  Date:  12/19/2015 Time Reviewed:  9:59 AM  Progress in Treatment: Attending groups: Yes. Participating in groups:  Yes. Taking medication as prescribed:  Yes. Tolerating medication:  Yes. Family/Significant othe contact made:  Yes, individual(s) contacted:  sister Patient understands diagnosis:  No. and As evidenced by:  limited insight  Discussing patient identified problems/goals with staff:  Yes. Medical problems stabilized or resolved:  Yes. Denies suicidal/homicidal ideation: Yes. Issues/concerns per patient self-inventory:  Yes. Other:  New problem(s) identified: No, Describe:  NA  Discharge Plan or Barriers:Pt plans to return home and follow up with outpatient.    Reason for Continuation of Hospitalization: Mania Medication stabilization  Comments:At exam she is less tangential, needs less redirection. Speech is not as loud or as pressure. Per staff she continues to attend groups but continues to be hyperverbal and hyperreligious. [name] usually has a multitude of somatic complaints. Today she told me she was able to sleep a little bit better however she woke up at 2:00 in the morning and later on she was able to fall back asleep. She no longer is feels drowsy. She feels "hyper" but at the same time she feels a little bit better. She continues to complain of "gas pains".   Estimated length of stay: Pt will discharge today.   New goal(s): NA  Review of initial/current patient goals per problem list:   1.  Goal(s): Patient will participate in aftercare plan * Met:  * Target date: at discharge * As evidenced by: Patient will participate within aftercare plan AEB aftercare provider and housing plan at discharge being identified.  2.  Goal (s): Patient will demonstrate decreased symptoms of mania. * Met: No  *  Target date: at discharge * As evidenced by: Patient will not endorse signs of mania or be deemed stable for  discharge by MD.   Attendees: Patient:  Gina [name] 5/17/20179:59 AM  Family:   5/17/20179:59 AM  Physician:  Dr. Jerilee Hoh   5/17/20179:59 AM  Nursing:   Carolynn Sayers, RN  5/17/20179:59 AM  Case Manager:   5/17/20179:59 AM  Counselor:   5/17/20179:59 AM  Other:  Wray Kearns, [place] 5/17/20179:59 AM  Other:  Everitt Amber, LR    Other:   5/17/20179:59 AM  Other:  5/17/20179:59 AM  Other:  5/17/20179:59 AM  Other:  5/17/20179:59 AM  Other:  5/17/20179:59 AM  Other:  5/17/20179:59 AM  Other:  5/17/20179:59 AM  Other:   5/17/20179:59 AM   Scribe for Treatment Team:   Wray Kearns, MSW, [place]  12/19/2015, 9:59 AM

## 2015-12-19 NOTE — Plan of Care (Signed)
Problem: Alteration in mood Goal: LTG-Patient's behavior demonstrates decreased manic symptoms (Patient's behavior demonstrates decreased manic symptoms to the point the patient is safe to return home and continue treatment in an outpatient setting)  Outcome: Not Progressing Patient demonstrate mania.

## 2015-12-19 NOTE — [facility] Suicide Risk Assessment (Signed)
[place] INPATIENT:  Family/Significant Other Suicide Prevention Education  Suicide Prevention Education:  Education Completed; Devoria Glassing (sister)  has been identified by the patient as the family member/significant other with whom the patient will be residing, and identified as the person(s) who will aid the patient in the event of a mental health crisis (suicidal ideations/suicide attempt).  With written consent from the patient, the family member/significant other has been provided the following suicide prevention education, prior to the and/or following the discharge of the patient.  The suicide prevention education provided includes the following:  Suicide risk factors  Suicide prevention and interventions  National Suicide Hotline telephone number  [facility] assessment telephone number  [facility] Emergency Assistance [place] and/or Residential Mobile Crisis Unit telephone number  Request made of family/significant other to:  Remove weapons (e.g., guns, rifles, knives), all items previously/currently identified as safety concern.    Remove drugs/medications (over-the-counter, prescriptions, illicit drugs), all items previously/currently identified as a safety concern.  The family member/significant other verbalizes understanding of the suicide prevention education information provided.  The family member/significant other agrees to remove the items of safety concern listed above.  [place] MSW, [place]  12/19/2015, 8:56 AM

## 2015-12-20 ENCOUNTER — Other Ambulatory Visit: Payer: Self-pay | Admitting: Psychiatry

## 2015-12-20 ENCOUNTER — Ambulatory Visit: Payer: Medicare Other | Admitting: Family Medicine

## 2015-12-20 NOTE — Telephone Encounter (Signed)
Pt d/c from inpt yesterday

## 2015-12-21 ENCOUNTER — Other Ambulatory Visit: Payer: Self-pay | Admitting: *Deleted

## 2015-12-21 ENCOUNTER — Telehealth: Payer: Self-pay

## 2015-12-21 DIAGNOSIS — E1165 Type 2 diabetes mellitus with hyperglycemia: Principal | ICD-10-CM

## 2015-12-21 DIAGNOSIS — E1121 Type 2 diabetes mellitus with diabetic nephropathy: Secondary | ICD-10-CM

## 2015-12-21 DIAGNOSIS — E1310 Other specified diabetes mellitus with ketoacidosis without coma: Secondary | ICD-10-CM

## 2015-12-21 DIAGNOSIS — IMO0002 Reserved for concepts with insufficient information to code with codable children: Secondary | ICD-10-CM

## 2015-12-21 NOTE — Telephone Encounter (Signed)
Pt left v/m that she recently found out she is diabetic and request diet of what foods pt can have. Per referral note pt did not go to diabetic teaching at [facility] on 11/29/15.Please advise.

## 2015-12-21 NOTE — Telephone Encounter (Signed)
Would offer her diabetes and food handout - from 11/07/2015 appt. Would offer DSME if she can attend. She did not show to last one.

## 2015-12-21 NOTE — Addendum Note (Signed)
Addended by: Vern Claude on: 12/21/2015 02:48 PM   Modules accepted: Orders

## 2015-12-21 NOTE — Patient Outreach (Signed)
[facility]) Care Management  12/21/2015  Gina [name] [birth year] [id number]   Phone call to patient to schedule initial home visit post discharge from the behavioral health hospital.  Per patient, she needs help with transportation, her diabetic diet and finances due to having multiple medical bills.  Plan:  Home visit scheduled for 12/25/15 at 12:30pm           Telephonic nurse to be notified of patient's discharge to address concerns related to her diet.    Sheralyn Boatman [facility] [address] Care Management [phone]

## 2015-12-22 ENCOUNTER — Other Ambulatory Visit: Payer: Self-pay | Admitting: Psychiatry

## 2015-12-24 ENCOUNTER — Other Ambulatory Visit: Payer: Self-pay | Admitting: *Deleted

## 2015-12-24 NOTE — Telephone Encounter (Addendum)
Pt called back and notified as instructed. Pt said she did not go to [facility] diabetic teaching because pt was in the hospital; pt is willing to go for diabetic teaching and request another appt scheduled at [facility]. Also pt has not had BM in 5 days. Last BM was large and somewhat constipated stool; pt is eating prunes, taking stool softeners and still no BM. Pt request cb with what to take for constipation. Pt is passing gas; no abd pain and no fever. [place].

## 2015-12-24 NOTE — Patient Outreach (Signed)
F/u on referral from [facility] telephonic RN CM.  Spoke with pt, HIPPA verified.  Discussed with pt referral received - pt wants help with diabetic diet to which pt confirmed.  Pt reports she has all of her medications, in the bubble pack, taking them.   Pt reports she was told she did not have to check her sugars, just take the diabetic medication.  As discussed, plan to do a home visit with pt 5/26.     Zara Chess.   [place] Care Management  [phone]

## 2015-12-25 ENCOUNTER — Other Ambulatory Visit: Payer: Self-pay | Admitting: *Deleted

## 2015-12-25 ENCOUNTER — Encounter: Payer: Self-pay | Admitting: *Deleted

## 2015-12-25 MED ORDER — POLYETHYLENE GLYCOL 3350 17 G PO PACK: 17.0000 g | PACK | Freq: Every day | ORAL | Status: DC | PRN

## 2015-12-25 NOTE — Patient Outreach (Signed)
  [facility] [facility]) Care Management  [facility] Social Work  12/25/2015  Gina [name] Dec 03, 1950 [id number]  Subjective:   Patient is a 65 year old female who was recently discharged from [facility].  Patient currently resides with her sister. Objective:   Encounter Medications:  Outpatient Encounter Prescriptions as of 12/25/2015  Medication Sig  . diphenhydrAMINE (BENADRYL) 25 mg capsule Take 1 capsule (25 mg total) by mouth daily with supper.  . divalproex (DEPAKOTE) 500 MG DR tablet Take 1 tablet (500 mg total) by mouth 2 (two) times daily with a meal.  . docusate sodium (COLACE) 100 MG capsule Take 2 capsules (200 mg total) by mouth 2 (two) times daily.  . haloperidol (HALDOL) 5 MG tablet Take 1 tablet (5 mg total) by mouth daily with supper.  . lamoTRIgine (LAMICTAL) 25 MG tablet TAKE 1 TABLET (25 MG TOTAL) BY MOUTH DAILY.  Marland Kitchen lovastatin (MEVACOR) 40 MG tablet Take 1 tablet (40 mg total) by mouth daily with supper.  . metFORMIN (GLUCOPHAGE) 500 MG tablet Take 1 tablet (500 mg total) by mouth daily with breakfast.  . Metoprolol Tartrate 37.5 MG TABS Take 37.5 mg by mouth 2 (two) times daily with a meal.  . OLANZapine (ZYPREXA) 15 MG tablet Take 2 tablets (30 mg total) by mouth daily with supper.  . polyethylene glycol (MIRALAX / GLYCOLAX) packet Take 17 g by mouth daily as needed for moderate constipation.   No facility-administered encounter medications on file as of 12/25/2015.    Functional Status:  In your present state of health, do you have any difficulty performing the following activities: 12/03/2015 12/03/2015  Hearing? - N  Vision? - N  Difficulty concentrating or making decisions? - N  Walking or climbing stairs? - N  Dressing or bathing? - N  Doing errands, shopping? Y -  Conservation officer, nature and eating ? - -  Using the Toilet? - -  In the past six months, have you accidently leaked urine? - -  Do you have problems with loss of bowel control? - -   Managing your Medications? - -  Managing your Finances? - -  Housekeeping or managing your Housekeeping? - -    Fall/Depression Screening:  PHQ 2/9 Scores 11/29/2015 09/12/2015 06/12/2015 10/11/2014 09/16/2013 08/27/2012  PHQ - 2 Score - 0 1 0 0 1  Exception Documentation Medical reason - - - - -    Assessment:  Patient reports discharging from Behavioral health on 12/19/15.  At discharge,  patient was referred for Assertive Community Treatment Team(ACT team) with [facility] [phone].   Clinician, Corinne Ports completed the intake today.  Appointment t with provider cancelled for 01/01/16 and rescheduled for 01/04/16 due to transportation concerns. [employer]. transporation and has been arranged .  Patient resides with her sister.  Sister assists with meals, household duties and assist with paying her bills. Patient did discuss literacy issues.  Patient previously provided with resources for literacy programs, however she states that she does not want to go alone and her sister refuses to attend the classes. Plan:  Patient verbalized having no other social work needs.  Case to be closed at this time to social work as patient is currently participating in another care management program,  the [place] Team(ACT Team).   Sheralyn Boatman [facility] Care Management [phone]

## 2015-12-25 NOTE — Addendum Note (Signed)
Addended by: Ria Bush on: 12/25/2015 08:58 AM   Modules accepted: Orders

## 2015-12-25 NOTE — Telephone Encounter (Addendum)
Referral to [facility] DSME placed. Recommend start miralax 1/2 - 1 capful daily - sent to pharmacy (although she should have some at home, last filled 12/06/2015). Update [place] with effect on Thursday

## 2015-12-26 ENCOUNTER — Encounter: Payer: Self-pay | Admitting: *Deleted

## 2015-12-26 NOTE — Telephone Encounter (Signed)
Patient notified and verbalized understanding. She is concerned about transportation to diabetic teaching classes. [place]-can you possibly help her with this? She doesn't drive at all.

## 2015-12-27 ENCOUNTER — Other Ambulatory Visit: Payer: Self-pay | Admitting: Pharmacist

## 2015-12-27 NOTE — Patient Outreach (Signed)
[facility]) Care Management  12/27/2015  Gina [name] February 12, 1951 [id number]  [place] had received a referral for patient 11/29/15 due to medication compliance concerns.   Patient was admitted 5/1-5/17/17.  Patient was contacted via phone today by Gina [name], and patient verified her name and date of birth.  Explained purpose of call to patient to review medications with her and assist with compliance.  Patient reports that she currently has her prescription in blister packs and that [facility] RN is visiting her tomorrow.  Offered to make a co-visit with [facility] RN and patient accepted this.   Plan:   Will plan to make a home visit with patient and complete co-visit with [name] [facility] RN care coordinator.    [name], PharmD, [place] [phone]

## 2015-12-27 NOTE — Telephone Encounter (Signed)
Pt took OTC med, Purelax and pt had large BM this morning . Pt only got the Miralax packet today and plans to take that tonight at bedtime.pt will cb on 12/28/15 with update but request cb anyway.

## 2015-12-27 NOTE — Telephone Encounter (Signed)
Noted. rec try miralax instead of OTC laxative for now.

## 2015-12-28 ENCOUNTER — Other Ambulatory Visit: Payer: Self-pay | Admitting: Pharmacist

## 2015-12-28 ENCOUNTER — Encounter: Payer: Self-pay | Admitting: Pharmacist

## 2015-12-28 ENCOUNTER — Other Ambulatory Visit: Payer: Self-pay | Admitting: *Deleted

## 2015-12-28 NOTE — Patient Outreach (Signed)
[facility]) Care Management  [place]   12/28/2015  NICOYA RADERMACHER [birth year] [id number]  Subjective:  Patient was referred to [place] for medication review and adherence issues.  Patient was admitted at [facility] 5/1-5/17/17.    Home visit was completed today with Magda Bernheim, [facility] RN [facility].    Patient reports that she received her discharge medications from [facility] in [place] and they were blister packaged and delivered to her---she reports she doesn't remember which days the pharmacy does delivery but she was concerned none of her prescriptions have refills.    Patient has 9 days empty of her morning blister packages and 8 days empty out of her evening blister packages.  She has 2 cards of morning medications and 2 cards of evening medications that she showed pharmacist.     She reports that she likes the blister packs and doesn't mind the charge the from  [facility] for blister packs or delivery at this time.    Objective:   Encounter Medications: Outpatient Encounter Prescriptions as of 12/28/2015  Medication Sig  . diphenhydrAMINE (BENADRYL) 25 mg capsule Take 1 capsule (25 mg total) by mouth daily with supper.  . divalproex (DEPAKOTE) 500 MG DR tablet Take 1 tablet (500 mg total) by mouth 2 (two) times daily with a meal.  . docusate sodium (COLACE) 100 MG capsule Take 2 capsules (200 mg total) by mouth 2 (two) times daily.  . haloperidol (HALDOL) 5 MG tablet Take 1 tablet (5 mg total) by mouth daily with supper.  . lovastatin (MEVACOR) 40 MG tablet Take 1 tablet (40 mg total) by mouth daily with supper.  . metFORMIN (GLUCOPHAGE) 500 MG tablet Take 1 tablet (500 mg total) by mouth daily with breakfast.  . Metoprolol Tartrate 37.5 MG TABS Take 37.5 mg by mouth 2 (two) times daily with a meal.  . OLANZapine (ZYPREXA) 15 MG tablet Take 2 tablets (30 mg total) by mouth daily with supper.  .  polyethylene glycol (MIRALAX / GLYCOLAX) packet Take 17 g by mouth daily as needed for moderate constipation.  Marland Kitchen lamoTRIgine (LAMICTAL) 25 MG tablet TAKE 1 TABLET (25 MG TOTAL) BY MOUTH DAILY. (Patient not taking: Reported on 12/28/2015)   No facility-administered encounter medications on file as of 12/28/2015.    Functional Status: In your present state of health, do you have any difficulty performing the following activities: 12/25/2015 12/03/2015  Hearing? N -  Vision? N -  Difficulty concentrating or making decisions? Y -  Walking or climbing stairs? N -  Dressing or bathing? Y -  Doing errands, shopping? [name]  Preparing Food and eating ? Y -  Using the Toilet? N -  In the past six months, have you accidently leaked urine? N -  Do you have problems with loss of bowel control? N -  Managing your Medications? Y -  Managing your Finances? Y -  Housekeeping or managing your Housekeeping? Y -    Fall/Depression Screening: PHQ 2/9 Scores 12/25/2015 11/29/2015 09/12/2015 06/12/2015 10/11/2014 09/16/2013 08/27/2012  PHQ - 2 Score 0 - 0 1 0 0 1  Exception Documentation - Medical reason - - - - -    Assessment:  Medication management:  Patient has enough medication to last until ~01/18/16 and needs refills on her medications.  Call placed to [facility] and staff reports that deliveries occur on Tuesdays and Thursdays----this information was provided to patient who verbalized understanding.  She did have multiple medication changes on discharge per the [facility] discharge med list on 12/19/15.    She states that she receives Partial SSA Extra Help and denies concerns affording her medications at this time.    Drugs sorted by system:  Neurologic/Psychologic: -divalproex sodium DR -haloperidol---initiated on discharge -olanzapine---dose increased on discharge   Cardiovascular: -lovastatin -metoprolol tartrate  Pulmonary/Allergy: -diphenhydramine  Gastrointestinal: -docusate -Miralax    Endocrine: -metformin---dose decreased from twice daily to once daily on discharge  Medications to avoid in the elderly: diphenhydramine---increased risk of sedation and anti-cholinergic effects in elderly  Other issues noted:   Lamotrigine is not on her discharge medication list and she does not have it in her possession---it appears refill was authorized 12/20/15 following discharge.    Plan:  1) Will assist patient in obtaining refills for her prescriptions as she was given a 30 day supply with 0 refills on discharge from [facility].    2) Attempted to contact Dr Anola Gurney office regarding lamotrigine to clarify if patient is to take this or not---there is also a drug-drug interaction between divalproex and lamotrigine.    Office was closed, so will route barriers letter to Dr Gretel Acre.   3) Will send initial encounter and barriers letter to patient's PCP.    4) Will follow-up with patient in ~2 weeks to help ensure she gets refills.    [name], PharmD, [place] [phone]

## 2015-12-28 NOTE — Telephone Encounter (Signed)
Patient notified

## 2015-12-30 ENCOUNTER — Encounter: Payer: Self-pay | Admitting: *Deleted

## 2015-12-30 NOTE — Patient Outreach (Signed)
[facility]) Care Management   Home visit for 12/28/15  RASHAE ROTHER 1950-11-12 [id number]  CLARINE ELROD is an 65 y.o. female  Subjective:  Pt reports she needs help with knowing what to eat on a diabetic diet.  Pt reports [name] at Dr. Danise Mina office is suppose to set her up for diabetic classes which she plans to attend if they are early (dependent on transportation).  Pt reports skips meals.  Pt reports she does not check her sugar, was told it was okay since it is difficult to get blood.    Objective:   Filed Vitals:   12/28/15 1431  BP: 100/70  Pulse: 52  Resp: 16    ROS  Physical Exam  Encounter Medications:   Outpatient Encounter Prescriptions as of 12/28/2015  Medication Sig  . diphenhydrAMINE (BENADRYL) 25 mg capsule Take 1 capsule (25 mg total) by mouth daily with supper.  . divalproex (DEPAKOTE) 500 MG DR tablet Take 1 tablet (500 mg total) by mouth 2 (two) times daily with a meal.  . docusate sodium (COLACE) 100 MG capsule Take 2 capsules (200 mg total) by mouth 2 (two) times daily.  . haloperidol (HALDOL) 5 MG tablet Take 1 tablet (5 mg total) by mouth daily with supper.  . lovastatin (MEVACOR) 40 MG tablet Take 1 tablet (40 mg total) by mouth daily with supper.  . metFORMIN (GLUCOPHAGE) 500 MG tablet Take 1 tablet (500 mg total) by mouth daily with breakfast.  . Metoprolol Tartrate 37.5 MG TABS Take 37.5 mg by mouth 2 (two) times daily with a meal.  . OLANZapine (ZYPREXA) 15 MG tablet Take 2 tablets (30 mg total) by mouth daily with supper.  . polyethylene glycol (MIRALAX / GLYCOLAX) packet Take 17 g by mouth daily as needed for moderate constipation.  Marland Kitchen lamoTRIgine (LAMICTAL) 25 MG tablet TAKE 1 TABLET (25 MG TOTAL) BY MOUTH DAILY. (Patient not taking: Reported on 12/28/2015)   No facility-administered encounter medications on file as of 12/28/2015.    Functional Status:   In your present state of health, do you have any difficulty  performing the following activities: 12/25/2015 12/03/2015  Hearing? N N  Vision? N N  Difficulty concentrating or making decisions? Y N  Walking or climbing stairs? N N  Dressing or bathing? Y N  Doing errands, shopping? Y -  Conservation officer, nature and eating ? Y -  Using the Toilet? N -  In the past six months, have you accidently leaked urine? N -  Do you have problems with loss of bowel control? N -  Managing your Medications? Y -  Managing your Finances? Y -  Housekeeping or managing your Housekeeping? Y -  Some encounter information is confidential and restricted. Go to Review Flowsheets activity to see all data.    Fall/Depression Screening:    PHQ 2/9 Scores 12/25/2015 11/29/2015 09/12/2015 06/12/2015 10/11/2014 09/16/2013 08/27/2012  PHQ - 2 Score 0 - 0 1 0 0 1  Exception Documentation - Medical reason - - - - -    Assessment:  Co visit done with [name] [facility] pharmacist.   Pleasant 65 year old woman, resides with her sister (has health issues).  Lungs clear, vitals stable,  no c/o pain.                           DM- pt does not check sugars, difficulty getting blood.  Sugar checked during home visit- result  86 (pt did not have lunch- 3 meals not a habit).   Per pt- education needed on diabetic diet, needs to know what to eat.                                                               Plan:  As discussed, pt to f/u with diabetic classes -being set up by Maudie Mercury at [place] office.            As discussed with pt, to add protein to her meals, avoid skipping meals.             RN CM to continue to provide community nurse case management services to pt, next home visit 6/28.           RN CM to inform Dr. Danise Mina of [facility] involvement- send barrier letter, route 5/26 home visit encounter by in basket.   THN CM Care Plan Problem One        Most Recent Value   Care Plan Problem One  Knowledge deficit related to diabetic diet    Role Documenting the Problem One  Care Management [place] for Problem One  Active   THN Long Term Goal (31-90 days)  Pt would have better understanding of diabetic diet in the next 60 days    THN Long Term Goal Start Date  12/28/15   Interventions for Problem One Long Term Goal  Provided pt with "What's on my plate", reviewed portion sizes, increase vegetables in diet, foods to avoid.    THN CM Short Term Goal #1 (0-30 days)  Pt would include protein with each meal for the next 30 days    THN CM Short Term Goal #1 Start Date  12/28/15   Interventions for Short Term Goal #1  Discussed with pt importance of including protein with each meal,snack- helps maintain blood sugars    THN CM Short Term Goal #2 (0-30 days)  Pt would not skip meals for the next 30 days    THN CM Short Term Goal #2 Start Date  12/28/15   Interventions for Short Term Goal #2  Discussed with pt importance of not skipping meals,suggested having 4-5 small frequent meals.      Zara Chess.   [place] Care Management  [phone]

## 2016-01-01 ENCOUNTER — Telehealth: Payer: Self-pay

## 2016-01-01 ENCOUNTER — Ambulatory Visit: Payer: Self-pay | Admitting: Family Medicine

## 2016-01-01 NOTE — Telephone Encounter (Signed)
Pt left v/m; has taken powdered med(? Miralax) as prescribed and has not had BM in 3 days. Pt also not sure if is to take Vit D3 2000 or 4000 mg. Do not see Vit D 3 on current or hx med list.Pt request cb.

## 2016-01-02 ENCOUNTER — Ambulatory Visit: Payer: Self-pay | Admitting: Family Medicine

## 2016-01-02 MED ORDER — POLYETHYLENE GLYCOL 3350 17 GM/SCOOP PO POWD: 17.0000 g | Freq: Two times a day (BID) | ORAL | Status: DC | PRN

## 2016-01-02 NOTE — Telephone Encounter (Signed)
rec increase miralax powder to twice a day for constipation over next 2 days and update [place] with effect on Friday. Recommend vit D 2000 IU daily. Added to list

## 2016-01-02 NOTE — Telephone Encounter (Signed)
I called last week and gave she and her sister resources for transportation assistance.

## 2016-01-02 NOTE — Telephone Encounter (Signed)
Patient notified and said she had a BM this AM. Will take 2000 IU of vitamin D QD.

## 2016-01-04 ENCOUNTER — Ambulatory Visit (INDEPENDENT_AMBULATORY_CARE_PROVIDER_SITE_OTHER): Payer: Medicare Other | Admitting: Family Medicine

## 2016-01-04 ENCOUNTER — Encounter: Payer: Self-pay | Admitting: Family Medicine

## 2016-01-04 VITALS — BP 124/82 | HR 56 | Temp 97.8°F | Wt 198.5 lb

## 2016-01-04 DIAGNOSIS — Z9989 Dependence on other enabling machines and devices: Secondary | ICD-10-CM

## 2016-01-04 DIAGNOSIS — IMO0002 Reserved for concepts with insufficient information to code with codable children: Secondary | ICD-10-CM

## 2016-01-04 DIAGNOSIS — E785 Hyperlipidemia, unspecified: Secondary | ICD-10-CM

## 2016-01-04 DIAGNOSIS — K5901 Slow transit constipation: Secondary | ICD-10-CM | POA: Insufficient documentation

## 2016-01-04 DIAGNOSIS — K5909 Other constipation: Secondary | ICD-10-CM

## 2016-01-04 DIAGNOSIS — N183 Chronic kidney disease, stage 3 unspecified: Secondary | ICD-10-CM

## 2016-01-04 DIAGNOSIS — G4733 Obstructive sleep apnea (adult) (pediatric): Secondary | ICD-10-CM | POA: Diagnosis not present

## 2016-01-04 DIAGNOSIS — I10 Essential (primary) hypertension: Secondary | ICD-10-CM | POA: Diagnosis not present

## 2016-01-04 DIAGNOSIS — E1121 Type 2 diabetes mellitus with diabetic nephropathy: Secondary | ICD-10-CM

## 2016-01-04 DIAGNOSIS — K7581 Nonalcoholic steatohepatitis (NASH): Secondary | ICD-10-CM

## 2016-01-04 DIAGNOSIS — E1165 Type 2 diabetes mellitus with hyperglycemia: Secondary | ICD-10-CM

## 2016-01-04 DIAGNOSIS — K5900 Constipation, unspecified: Secondary | ICD-10-CM

## 2016-01-04 DIAGNOSIS — F3112 Bipolar disorder, current episode manic without psychotic features, moderate: Secondary | ICD-10-CM

## 2016-01-04 NOTE — Assessment & Plan Note (Signed)
Needs close monitoring given med regimen.

## 2016-01-04 NOTE — Progress Notes (Signed)
BP 124/82 mmHg  Pulse 56  Temp(Src) 97.8 F (36.6 C) (Oral)  Wt 198 lb 8 oz (90.039 kg)   CC: f/u visit  Subjective:    Patient ID: Gina [name], female    DOB: 10-28-1950, 65 y.o.   MRN: [id number]  HPI: Gina [name] is a 65 y.o. female presenting on 01/04/2016 for Follow-up   See prior note for details. [facility] involved, appreciate their care. We also referred to DSME for further education. She has set up DSME at [facility].   Recent hospitalization 5/1-17/2017 for acute mania in bipolar disorder. Discharged on olanzapine 30mg  daily, depakote DR 500mg  BID, haldol 5mg  nightly. She is not taking lamictal. Also taking benadryl 25mg  QHS for EPS and lactulose qod. Metformin was recently decreased to 500mg  daily.   Having more trouble with constipation despite regular miralax twice daily and colace 100mg  twice daily and lactulose every other day. Clarified that stools are still regular and soft just not every day. No abd pain, passing sgas well.   OSA compliant with CPAP nightly.   Recent DM diagnosis - compliant with metformin 500mg  daily. Trouble checking sugars at home. . Lab Results  Component Value Date   HGBA1C 6.9* 12/04/2015     Enjoys walking with her dog Elvis.   Relevant past medical, surgical, family and social history reviewed and updated as indicated. Interim medical history since our last visit reviewed. Allergies and medications reviewed and updated. Current Outpatient Prescriptions on File Prior to Visit  Medication Sig  . Cholecalciferol (VITAMIN D) 2000 units CAPS Take 1 capsule by mouth daily.  . diphenhydrAMINE (BENADRYL) 25 mg capsule Take 1 capsule (25 mg total) by mouth daily with supper.  . divalproex (DEPAKOTE) 500 MG DR tablet Take 1 tablet (500 mg total) by mouth 2 (two) times daily with a meal.  . docusate sodium (COLACE) 100 MG capsule Take 2 capsules (200 mg total) by mouth 2 (two) times daily.  . haloperidol (HALDOL) 5 MG tablet Take 1 tablet  (5 mg total) by mouth daily with supper.  . lovastatin (MEVACOR) 40 MG tablet Take 1 tablet (40 mg total) by mouth daily with supper.  . metFORMIN (GLUCOPHAGE) 500 MG tablet Take 1 tablet (500 mg total) by mouth daily with breakfast.  . Metoprolol Tartrate 37.5 MG TABS Take 37.5 mg by mouth 2 (two) times daily with a meal.  . OLANZapine (ZYPREXA) 15 MG tablet Take 2 tablets (30 mg total) by mouth daily with supper.  . polyethylene glycol powder (GLYCOLAX/MIRALAX) powder Take 17 g by mouth 2 (two) times daily as needed for moderate constipation.  Marland Kitchen lamoTRIgine (LAMICTAL) 25 MG tablet TAKE 1 TABLET (25 MG TOTAL) BY MOUTH DAILY. (Patient not taking: Reported on 12/28/2015)   No current facility-administered medications on file prior to visit.    Review of Systems Per HPI unless specifically indicated in ROS section     Objective:    BP 124/82 mmHg  Pulse 56  Temp(Src) 97.8 F (36.6 C) (Oral)  Wt 198 lb 8 oz (90.039 kg)  Wt Readings from Last 3 Encounters:  01/04/16 198 lb 8 oz (90.039 kg)  12/28/15 198 lb (89.812 kg)  12/10/15 196 lb 8 oz (89.132 kg)   Body mass index is 36.3 kg/(m^2).  Physical Exam  Constitutional: She appears well-developed and well-nourished. No distress.  HENT:  Mouth/Throat: Oropharynx is clear and moist. No oropharyngeal exudate.  Eyes: Conjunctivae and EOM are normal. Pupils are equal, round, and reactive to  light. No scleral icterus.  Cardiovascular: Normal rate, regular rhythm, normal heart sounds and intact distal pulses.   No murmur heard. Pulmonary/Chest: Effort normal and breath sounds normal. No respiratory distress. She has no wheezes. She has no rales.  Musculoskeletal: She exhibits no edema.  Skin: Skin is warm and dry. No rash noted.  Psychiatric: She has a normal mood and affect.  Nursing note and vitals reviewed.     Assessment & Plan:   Problem List Items Addressed This Visit    HTN (hypertension), benign - Primary    Chronic, stable off  meds.       CKD (chronic kidney disease) stage 3, GFR 30-59 ml/min    Metformin recently decreased due to worsening readings. Recheck next visit.       OSA on CPAP    Reports compliance      Hyperlipidemia    Chronic. Continue lovastatin.       Uncontrolled type 2 diabetes mellitus with nephropathy ([place])    On metformin 500mg  daily. Pending diabetes education next week. Advised she take glucose meter as she's having trouble learning how to check sugars.  Check A1c next labwork. Recently widely fluctuating readings (12-->6.9%)      NASH (nonalcoholic steatohepatitis)    Needs close monitoring given med regimen.      Bipolar disorder, manic, moderate ([place])    Appreciate psych care of patient. Continue current regimen.  She states she is receiving home visits from psychiatry.       Chronic constipation    Reviewed regimen which includes miralax BID PRN, colace 200mg  BID, and lactulose QOD.       Relevant Medications   lactulose (CHRONULAC) 10 GM/15ML solution       Follow up plan: Return in about 2 months (around 03/05/2016) for follow up visit.  Ria Bush, MD

## 2016-01-04 NOTE — Progress Notes (Signed)
Pre visit review using our clinic review tool, if applicable. No additional management support is needed unless otherwise documented below in the visit note. 

## 2016-01-04 NOTE — Assessment & Plan Note (Signed)
Metformin recently decreased due to worsening readings. Recheck next visit.

## 2016-01-04 NOTE — Assessment & Plan Note (Signed)
Appreciate psych care of patient. Continue current regimen.  She states she is receiving home visits from psychiatry.

## 2016-01-04 NOTE — Assessment & Plan Note (Signed)
Reports compliance

## 2016-01-04 NOTE — Patient Instructions (Addendum)
Miralax 1 package or capful twice daily. Continue colace (docusate).  Call [facility] to schedule mammogram [phone] Good to see you today Return as needed or in 2 months for follow up.

## 2016-01-04 NOTE — Assessment & Plan Note (Signed)
Chronic. Continue lovastatin.

## 2016-01-04 NOTE — Assessment & Plan Note (Signed)
Chronic, stable off meds.  

## 2016-01-04 NOTE — Assessment & Plan Note (Signed)
Reviewed regimen which includes miralax BID PRN, colace 200mg  BID, and lactulose QOD.

## 2016-01-04 NOTE — Assessment & Plan Note (Addendum)
On metformin 500mg  daily. Pending diabetes education next week. Advised she take glucose meter as she's having trouble learning how to check sugars.  Check A1c next labwork. Recently widely fluctuating readings (12-->6.9%)

## 2016-01-08 ENCOUNTER — Telehealth: Payer: Self-pay | Admitting: Family Medicine

## 2016-01-08 ENCOUNTER — Other Ambulatory Visit: Payer: Self-pay | Admitting: Family Medicine

## 2016-01-08 DIAGNOSIS — Z1231 Encounter for screening mammogram for malignant neoplasm of breast: Secondary | ICD-10-CM

## 2016-01-08 NOTE — Telephone Encounter (Signed)
Pt is having an hard time getting transportation  Can you help her

## 2016-01-09 NOTE — Telephone Encounter (Signed)
Yes, I will call her today. I called a few weeks ago and gave her two resources for transportation. I will try her again today as she has possibly misplaced the information I gave her a few weeks ago. Thanks.

## 2016-01-09 NOTE — Telephone Encounter (Signed)
Pt needs to go to  [facility] [address], [place]  Can you call pt ASAP

## 2016-01-10 NOTE — Telephone Encounter (Signed)
I spoke with Gina [name] and gave her the number to [employer], a program through [employer]. She said she will call and make arrangements. Thanks.

## 2016-01-11 ENCOUNTER — Encounter: Payer: Self-pay | Admitting: *Deleted

## 2016-01-11 ENCOUNTER — Encounter: Payer: Medicare Other | Attending: Family Medicine | Admitting: *Deleted

## 2016-01-11 VITALS — BP 122/80 | Ht 63.0 in | Wt 197.1 lb

## 2016-01-11 DIAGNOSIS — E119 Type 2 diabetes mellitus without complications: Secondary | ICD-10-CM | POA: Diagnosis not present

## 2016-01-11 NOTE — Progress Notes (Signed)
Diabetes Self-Management Education  Visit Type: First/Initial  Appt. Start Time: 1035 Appt. End Time: [id number]  01/11/2016  Gina [name], identified by name and date of birth, is a 65 y.o. female with a diagnosis of Diabetes: Type 2.   ASSESSMENT  Blood pressure 122/80, height 5\' 3"  (1.6 m), weight 197 lb 1.6 oz (89.404 kg). Body mass index is 34.92 kg/(m^2).      Diabetes Self-Management Education - 01/11/16 1554    Visit Information   Visit Type First/Initial   Initial Visit   Diabetes Type Type 2   Are you currently following a meal plan? No   Are you taking your medications as prescribed? Yes   Date Diagnosed 2 months ago   Health Coping   How would you rate your overall health? Poor   Psychosocial Assessment   Patient Belief/Attitude about Diabetes Other (comment)  "depressed"   Self-care barriers None   Self-management support Doctor's office;Family   Patient Concerns Nutrition/Meal planning;Medication;Glycemic Control;Weight Control   Special Needs None   Preferred Learning Style Auditory   Learning Readiness Contemplating   How often do you need to have someone help you when you read instructions, pamphlets, or other written materials from your doctor or pharmacy? 2 - Rarely   What is the last grade level you completed in school? GED   Pre-Education Assessment   Patient understands the diabetes disease and treatment process. Needs Instruction   Patient understands incorporating nutritional management into lifestyle. Needs Instruction   Patient undertands incorporating physical activity into lifestyle. Needs Instruction   Patient understands using medications safely. Needs Instruction   Patient understands monitoring blood glucose, interpreting and using results Needs Instruction   Patient understands prevention, detection, and treatment of acute complications. Needs Instruction   Patient understands prevention, detection, and treatment of chronic  complications. Needs Instruction   Patient understands how to develop strategies to address psychosocial issues. Needs Instruction   Patient understands how to develop strategies to promote health/change behavior. Needs Instruction   Complications   Last HgB A1C per patient/outside source 6.9 %  12/04/15   How often do you check your blood sugar? 0 times/day (not testing)  Pt brought a meter to this appointment and reports difficulty using. She checked some blood sugars in April of 97 and 116. Instructed her on Accu-Chek Aviva meter. BG upon return demonstration was 76 mg/dL at 11:20 am - 4 hrs pp.    Have you had a dilated eye exam in the past 12 months? Yes   Have you had a dental exam in the past 12 months? Yes   Are you checking your feet? Yes   How many days per week are you checking your feet? 7   Dietary Intake   Breakfast cereal and milk   Snack (morning) graham crackers, veggie chips, wheat crackers, raisins   Lunch skips   Snack (afternoon) same as morning   Dinner grilled cheese sandwich (3); chicken, mac n cheese, pizza (whatever her sister prepares)   Beverage(s) juice, water, coffee   Exercise   Exercise Type ADL's   Patient Education   Previous Diabetes Education No   Disease state  Definition of diabetes, type 1 and 2, and the diagnosis of diabetes   Nutrition management  Role of diet in the treatment of diabetes and the relationship between the three main macronutrients and blood glucose level   Physical activity and exercise  Role of exercise on diabetes management, blood pressure control and cardiac health.  Medications Reviewed patients medication for diabetes, action, purpose, timing of dose and side effects.   Monitoring Taught/evaluated SMBG meter.;Purpose and frequency of SMBG.;Identified appropriate SMBG and/or A1C goals.   Chronic complications Relationship between chronic complications and blood glucose control   Psychosocial adjustment Identified and addressed  patients feelings and concerns about diabetes   Individualized Goals (developed by patient)   Reducing Risk Improve blood sugars Decrease medications Lose weight   Outcomes   Expected Outcomes Demonstrated interest in learning. Expect positive outcomes   Future DMSE 4-6 wks      Individualized Plan for Diabetes Self-Management Training:   Learning Objective:  Patient will have a greater understanding of diabetes self-management. Patient education plan is to attend individual and/or group sessions per assessed needs and concerns.   Plan:   Patient Instructions  Check blood sugars 1 x day before breakfast or 2 hrs after supper every day Exercise: Walk for    10  minutes   3 days a week and gradually increase as tolerated Eat 3 meals day,  2  snacks a day Space meals 4-6 hours apart Don't skip meals Bring blood sugar records to the next class   Expected Outcomes:  Demonstrated interest in learning. Expect positive outcomes  Education material provided:  General Meal Planning Guidelines Simple Meal Plan  If problems or questions, patient to contact team via:  Johny Drilling, [place], [place], CDE [phone]  Future DSME appointment: 4-6 wks  February 14, 2016 for Diabetes Class 1

## 2016-01-11 NOTE — Patient Instructions (Addendum)
Check blood sugars 1 x day before breakfast or 2 hrs after supper every day Exercise: Walk for    10  minutes   3 days a week and gradually increase as tolerated Eat 3 meals day,  2  snacks a day Space meals 4-6 hours apart Don't skip meals Bring blood sugar records to the next class

## 2016-01-14 ENCOUNTER — Telehealth: Payer: Self-pay

## 2016-01-14 ENCOUNTER — Other Ambulatory Visit: Payer: Self-pay | Admitting: *Deleted

## 2016-01-14 MED ORDER — DIPHENHYDRAMINE HCL 25 MG PO CAPS: 25.0000 mg | ORAL_CAPSULE | Freq: Every day | ORAL | Status: DC

## 2016-01-14 MED ORDER — METOPROLOL TARTRATE 37.5 MG PO TABS: 37.5000 mg | ORAL_TABLET | Freq: Two times a day (BID) | ORAL | Status: DC

## 2016-01-14 MED ORDER — DOCUSATE SODIUM 100 MG PO CAPS: 200.0000 mg | ORAL_CAPSULE | Freq: Two times a day (BID) | ORAL | Status: DC

## 2016-01-14 MED ORDER — METFORMIN HCL 500 MG PO TABS: 500.0000 mg | ORAL_TABLET | Freq: Every day | ORAL | Status: DC

## 2016-01-14 MED ORDER — LOVASTATIN 40 MG PO TABS: 40.0000 mg | ORAL_TABLET | Freq: Every day | ORAL | Status: DC

## 2016-01-14 NOTE — Telephone Encounter (Signed)
[name] faxed over all refills for pt; pt wants delivered on 01/15/16; [name] said pt ger adherence bubble packs done so more time consuming and hopes refills can be sent over ASAP because deliveries go out in AM about 10 AM. [name] request cb.

## 2016-01-14 NOTE — Telephone Encounter (Signed)
Meds refilled with exception of depakote, haldol and zyprexa which need to come from psych. Pharmacy notified.

## 2016-01-15 ENCOUNTER — Other Ambulatory Visit: Payer: Self-pay

## 2016-01-15 MED ORDER — HALOPERIDOL 5 MG PO TABS: 5.0000 mg | ORAL_TABLET | Freq: Every day | ORAL | Status: DC

## 2016-01-15 MED ORDER — OLANZAPINE 15 MG PO TABS: 30.0000 mg | ORAL_TABLET | Freq: Every day | ORAL | Status: DC

## 2016-01-15 MED ORDER — DIVALPROEX SODIUM 500 MG PO DR TAB: 500.0000 mg | DELAYED_RELEASE_TABLET | Freq: Two times a day (BID) | ORAL | Status: DC

## 2016-01-15 NOTE — Telephone Encounter (Signed)
[name] at [facility] left v/m requesting cb; when pt was in hospital a referral was made for outpatient psychiatrist appt; the referral fell thru and pt does not have out pt psychiatrist at this time. Pt will be out of psych meds in 2 days.[name] spoke with Dr Jerilee Hoh, the inpatient psychiatrist pt saw while in hospital. Since pt is not an inpatient Dr Jerilee Hoh cannnot prescribe for pt now. [name] wants to know if Dr Darnell Level would consider at least one month refill for Depakote, Haldol and Zyprexa until arrangements can be made for pt to see an out pt psychiatrist. [name] request cb.

## 2016-01-15 NOTE — Telephone Encounter (Signed)
Refilled. plz notify [name].

## 2016-01-15 NOTE — Telephone Encounter (Signed)
[name] notified. She said that in speaking with Dr. Clovis Fredrickson referral by Dr. Jiles Prows through [facility] funding ran out. So, they referred her to [facility]. However, [facility] only received referral, but no notes or demographics. [facility] didn't pursue things further, so she fell through the cracks. Dr. Jerilee Hoh is taking care of this situation and is sending you an update on this. She appreciated you sending in the refills.

## 2016-01-16 ENCOUNTER — Other Ambulatory Visit: Payer: Self-pay | Admitting: Pharmacist

## 2016-01-16 NOTE — Patient Outreach (Signed)
[facility]) Care Management  01/16/2016  Gina [name] 06/10/1951 [id number]  Phone call with patient to follow-up her refills that she will need filled and delivered by [facility].  Per chart review it appears that Dr Danise Mina authorized refills for patient's medications, patient reports not receiving them yet but reports she still has medication.   Call placed to [facility] and spoke with Jinny Blossom, who reports refills are scheduled to be delivered to patient tomorrow.    Advised patient of this and will plan to follow-up with patient tomorrow to ensure she received refills.   [name], PharmD, [place] [phone]

## 2016-01-17 ENCOUNTER — Other Ambulatory Visit: Payer: Self-pay | Admitting: Pharmacist

## 2016-01-17 NOTE — Patient Outreach (Signed)
Called patient to follow-up if she received her medication delivered from [facility].  Patient answered call and verified name and date of birth.   Patient reports [facility] pharmacy did deliver her refills today.  She states that she is planning to continue using [facility] as she presently likes the bubble packing they do for her.  Initial referral to [facility] was due to medication non-adherence.    Plan:   Will follow-up with patient in about 2 weeks to see if she is still using bubble packing for her medications.  If she continues with bubble packed medications and delivery from [facility], anticipate pharmacy case closure.    [name], PharmD, [place] [phone]

## 2016-01-24 ENCOUNTER — Other Ambulatory Visit: Payer: Self-pay | Admitting: Family Medicine

## 2016-01-24 ENCOUNTER — Ambulatory Visit
Admission: RE | Admit: 2016-01-24 | Discharge: 2016-01-24 | Disposition: A | Discharge status: Home / Self Care | Payer: Medicare Other | Source: Ambulatory Visit | Attending: Family Medicine | Admitting: Family Medicine

## 2016-01-24 DIAGNOSIS — Z1231 Encounter for screening mammogram for malignant neoplasm of breast: Secondary | ICD-10-CM

## 2016-01-24 HISTORY — DX: Malignant neoplasm of bladder, unspecified: C67.9

## 2016-01-24 LAB — HM MAMMOGRAPHY

## 2016-01-25 ENCOUNTER — Encounter: Payer: Self-pay | Admitting: *Deleted

## 2016-01-29 ENCOUNTER — Encounter: Payer: Self-pay | Admitting: *Deleted

## 2016-01-29 NOTE — Progress Notes (Signed)
Pt came by today for meter instruction. Blood sugar upon return demonstration was 205 mg/dL at 10:15 am - 2 hrs pp. She had cereal and milk for breakfast. Discussed adding protein. Wrote down instructions for use of Accu-Chek Aviva meter. Instructed her to call back if she has any further questions.

## 2016-01-30 ENCOUNTER — Other Ambulatory Visit: Payer: Self-pay | Admitting: *Deleted

## 2016-01-30 NOTE — Patient Outreach (Signed)
[facility]) Care Management   01/30/2016  Gina [name] 01/15/1951 [id number]  Gina [name] is an 65 y.o. female  Subjective:  Pt reports started having pain/swelling in left foot, usually apply icy hot cream,hx of gout.  Pt reports to start diabetic classes next month, signed up for  7/13 and 7/20.  Pt reports was having problems checking her sugars (not enough blood) saw  nurse 6/26, walked her through it  and today did it right.  Pt states checks her sugars once a day.  Pt reports she called MD about getting medication called in, did not see psychiatrist, has an evaluation 7/5, after that will assign a psychiatrist to come to her home (issues with transportation).    Objective:   Filed Vitals:   01/30/16 1319  BP: 100/70  Pulse: 55  Resp: 16    ROS  Physical Exam  Constitutional: She is oriented to person, place, and time. She appears well-developed and well-nourished.  Cardiovascular: Regular rhythm and normal heart sounds.   HR low   Respiratory: Effort normal and breath sounds normal.  GI: Soft. Bowel sounds are normal.  Musculoskeletal: Normal range of motion. She exhibits edema.  +1 edema top of left foot, trace top of right foot.   Neurological: She is alert and oriented to person, place, and time.  Skin: Skin is warm and dry.  Psychiatric: She has a normal mood and affect. Her behavior is normal. Judgment and thought content normal.    Encounter Medications:   Outpatient Encounter Prescriptions as of 01/30/2016  Medication Sig Note  . Cholecalciferol (VITAMIN D) 2000 units CAPS Take 1 capsule by mouth daily.   . diphenhydrAMINE (BENADRYL) 25 mg capsule Take 1 capsule (25 mg total) by mouth daily with supper.   . divalproex (DEPAKOTE) 500 MG DR tablet Take 1 tablet (500 mg total) by mouth 2 (two) times daily with a meal.   . docusate sodium (COLACE) 100 MG capsule Take 2 capsules (200 mg total) by mouth 2 (two) times daily.   Marland Kitchen  lovastatin (MEVACOR) 40 MG tablet Take 1 tablet (40 mg total) by mouth daily with supper.   . metFORMIN (GLUCOPHAGE) 500 MG tablet Take 1 tablet (500 mg total) by mouth daily with breakfast.   . Metoprolol Tartrate 37.5 MG TABS Take 37.5 mg by mouth 2 (two) times daily with a meal.   . OLANZapine (ZYPREXA) 15 MG tablet Take 2 tablets (30 mg total) by mouth daily with supper.   . polyethylene glycol powder (GLYCOLAX/MIRALAX) powder Take 17 g by mouth 2 (two) times daily as needed for moderate constipation. 01/30/2016: As needed   . haloperidol (HALDOL) 5 MG tablet Take 1 tablet (5 mg total) by mouth daily with supper. (Patient not taking: Reported on 01/30/2016)   . lactulose (CHRONULAC) 10 GM/15ML solution Take 10 g by mouth every other day. Reported on 01/30/2016   . lamoTRIgine (LAMICTAL) 25 MG tablet TAKE 1 TABLET (25 MG TOTAL) BY MOUTH DAILY. (Patient not taking: Reported on 01/30/2016)    No facility-administered encounter medications on file as of 01/30/2016.    Functional Status:   In your present state of health, do you have any difficulty performing the following activities: 01/30/2016 12/25/2015  Hearing? N N  Vision? N N  Difficulty concentrating or making decisions? [name]  Walking or climbing stairs? N N  Dressing or bathing? N Y  Doing errands, shopping? [name]  Preparing Food and eating ? [name]  Using the Toilet? N N  In the past six months, have you accidently leaked urine? N N  Do you have problems with loss of bowel control? N N  Managing your Medications? Y Y  Managing your Finances? N Y  Housekeeping or managing your Housekeeping? N Y    Fall/Depression Screening:    PHQ 2/9 Scores 01/11/2016 12/25/2015 11/29/2015 09/12/2015 06/12/2015 10/11/2014 09/16/2013  PHQ - 2 Score 1 0 - 0 1 0 0  Exception Documentation - - Medical reason - - - -    Assessment:   Pleasant 65 year old woman, resides with sister.                             Lungs clear, HR 55.  +1 edema noted top of left foot.  Trace top of right foot/bilateral ankles.                           DM- view of pt's glucometer recent blood sugars- today 126 before breakfast, yesterday 205.                                   Had pt demonstrate use of glucometer today, pt successful in checking blood sugar.                             Plan:    Pt to continue to check sugars daily, record, show results to MD.                Pt to complete scheduled diabetic classes - 7/13, 7/20.              Pt to monitor edema in both feet, elevate as much as possible, call MD for increase in edema/pain.               Plan to continue to provide pt with community nurse case management services, next home visit                  7/28.    [facility] CM Care Plan Problem One        Most Recent Value   Care Plan Problem One  Knowledge deficit related to diabetic diet    Role Documenting the Problem One  Care Management Coordinator   [facility] CM Short Term Goal #1 (0-30 days)  Pt would include protein with each meal for the next 30 days    [facility] CM Short Term Goal #1 Start Date  12/28/15   [facility] CM Short Term Goal #1 Met Date  -- [not addresed in time frame. ]   [facility] CM Short Term Goal #2 (0-30 days)  Pt would not skip meals for the next 30 days    [facility] CM Short Term Goal #2 Start Date  12/28/15   [facility] CM Short Term Goal #2 Met Date  -- [not met in time frame- 6/28 pt reports not skipping meals. ]    [facility] CM Care Plan Problem Two        Most Recent Value   Care Plan Problem Two  Swelling/pain top of left foot/edema top of right foot    Role Documenting the Problem Two  Care Management [place] for Problem Two  Active   [facility]  CM Short Term Goal #1 (0-30 days)  Swelling/pain would resolve in the next 30 days    [facility] CM Short Term Goal #1 Start Date  01/30/16   Interventions for Short Term Goal #2   Discussed with pt compliance with Low Na+ diet, foods to avoid, elevate feet, call MD if pain or edema increased.       Zara Chess.   [place]  Care Management  [phone]

## 2016-01-31 ENCOUNTER — Ambulatory Visit: Payer: Self-pay | Admitting: Pharmacist

## 2016-01-31 ENCOUNTER — Telehealth: Payer: Self-pay | Admitting: *Deleted

## 2016-01-31 NOTE — Telephone Encounter (Signed)
Received voice mail from patient yesterday evening requesting call back on classes. Phone call made to her number today. She has to ride [employer] transportation and she will have to be here early around 8 am for 9 am classes due to where she lives. Pt wanted to make sure that our office will be open that early. She will still come to July 13 series but will need to change her last class (Class 3) to Aug 17. Pt has set these dates and times with [employer]. She reports that she is able to use her meter now. FBG 140 mg/dL today.

## 2016-02-11 ENCOUNTER — Other Ambulatory Visit: Payer: Self-pay | Admitting: Pharmacist

## 2016-02-11 NOTE — Patient Outreach (Signed)
[facility] [facility]) Care Management  [place]   02/11/2016  CARILYN STANGE 1951/04/05 [id number]  Subjective:  Phone call to patient to follow-up medication adherence as she is using medication blister packs from [facility].  Patient answered call and verified HIPAA details.  Patient reports that she is not having difficulty remembering her medications since medications are blister packed for her.  She reports she called in refills to her pharmacy on Monday.    Patient does report that she was told by the pharmacy her medications can only be filled twice, and she was confused as to why this was the case.    [facility] Pharmacist contacted [place] for clarification---[facility] pharmacy staff report that patient's PCP had authorized 2 total refills on her psychiatric medications until she could establish with a provider so patient has refills for this month and next month.    Placed call back to patient and explained this to her.  She stated understanding and says she is working to establish with a psychiatric provider.    Objective:   Current Medications: Current Outpatient Prescriptions  Medication Sig Dispense Refill  . diphenhydrAMINE (BENADRYL) 25 mg capsule Take 1 capsule (25 mg total) by mouth daily with supper. 30 capsule 3  . divalproex (DEPAKOTE) 500 MG DR tablet Take 1 tablet (500 mg total) by mouth 2 (two) times daily with a meal. 60 tablet 3  . docusate sodium (COLACE) 100 MG capsule Take 2 capsules (200 mg total) by mouth 2 (two) times daily. 180 capsule 0  . haloperidol (HALDOL) 5 MG tablet Take 1 tablet (5 mg total) by mouth daily with supper. 30 tablet 3  . lovastatin (MEVACOR) 40 MG tablet Take 1 tablet (40 mg total) by mouth daily with supper. 30 tablet 3  . metFORMIN (GLUCOPHAGE) 500 MG tablet Take 1 tablet (500 mg total) by mouth daily with breakfast. 30 tablet 3  . Metoprolol Tartrate 37.5 MG TABS Take 37.5 mg by mouth 2 (two) times  daily with a meal. 60 tablet 3  . OLANZapine (ZYPREXA) 15 MG tablet Take 2 tablets (30 mg total) by mouth daily with supper. 60 tablet 3  . polyethylene glycol powder (GLYCOLAX/MIRALAX) powder Take 17 g by mouth 2 (two) times daily as needed for moderate constipation. 3350 g 1  . Cholecalciferol (VITAMIN D) 2000 units CAPS Take 1 capsule by mouth daily.    Marland Kitchen lactulose (CHRONULAC) 10 GM/15ML solution Take 10 g by mouth every other day. Reported on 01/30/2016    . lamoTRIgine (LAMICTAL) 25 MG tablet TAKE 1 TABLET (25 MG TOTAL) BY MOUTH DAILY. (Patient not taking: Reported on 01/30/2016) 30 tablet 0   No current facility-administered medications for this visit.    Functional Status: In your present state of health, do you have any difficulty performing the following activities: 01/30/2016 12/25/2015  Hearing? N N  Vision? N N  Difficulty concentrating or making decisions? [name]  Walking or climbing stairs? N N  Dressing or bathing? N Y  Doing errands, shopping? [name]  Preparing Food and eating ? N Y  Using the Toilet? N N  In the past six months, have you accidently leaked urine? N N  Do you have problems with loss of bowel control? N N  Managing your Medications? Y Y  Managing your Finances? N Y  Housekeeping or managing your Housekeeping? N Y    Fall/Depression Screening: PHQ 2/9 Scores 01/11/2016 12/25/2015 11/29/2015 09/12/2015 06/12/2015 10/11/2014 09/16/2013  PHQ -  2 Score 1 0 - 0 1 0 0  Exception Documentation - - Medical reason - - - -    Assessment:  Called patient's pharmacy to determine why she could only get refills twice and informed patient of what pharmacy staff reported as indicated in subjective section.    Patient prefers a follow-up call to ensure she continues to fill her medications.    She denies need for a follow-up pharmacy home visit and denies medication questions at this time.    Plan:  Will place a follow-up phone call to patient in 2-3 weeks to determine if she was  able to get further refills for her medications.  Anticipate potential pharmacy case closure if she continues to remain adherent to medications.     [name], PharmD, [place] [phone]

## 2016-02-14 ENCOUNTER — Encounter: Payer: Self-pay | Admitting: Dietician

## 2016-02-14 ENCOUNTER — Encounter: Payer: Medicare Other | Attending: Family Medicine | Admitting: Dietician

## 2016-02-14 VITALS — Ht 63.0 in | Wt 197.4 lb

## 2016-02-14 DIAGNOSIS — E119 Type 2 diabetes mellitus without complications: Secondary | ICD-10-CM | POA: Insufficient documentation

## 2016-02-14 NOTE — Progress Notes (Signed)
Appt. Start Time: 0900 Appt. End Time: 1200  Class 1 Diabetes Overview - define DM; state own type of DM; identify functions of pancreas and insulin; define insulin deficiency vs insulin resistance  Psychosocial - identify DM as a source of stress; state the effects of stress on BG control; verbalize appropriate stress management techniques; identify personal stress issues   Nutritional Management - describe effects of food on blood glucose; identify sources of carbohydrate, protein and fat; verbalize the importance of balance meals in controlling blood glucose; identify meals as well balanced or not; estimate servings of carbohydrate from menus; use food labels to identify servings size, content of carbohydrate, fiber, protein, fat, saturated fat and sodium; recognize food sources of fat, saturated fat, trans fat, sodium and verbalize goals for intake; describe healthful appropriate food choices when dining out   Exercise - describe the effects of exercise on blood glucose and importance of regular exercise in controlling diabetes; state a plan for personal exercise; verbalize contraindications for exercise  Self-Monitoring - state importance of HBGM and demo procedure accurately; use HBGM results to effectively manage diabetes; identify importance of regular HbA1C testing and goals for results  Acute Complications/Sick Day Guidelines - recognize hyperglycemia and hypoglycemia with causes and effects; identify blood glucose results as high, low or in control; list steps in treating and preventing high and low blood glucose; state appropriate measure to manage blood glucose when ill (need for meds, HBGM plan, when to call physician, need for fluids)  Chronic Complications/Foot, Skin, Eye Dental Care - identify possible long-term complications of diabetes (retinopathy, neuropathy, nephropathy, cardiovascular disease, infections); explain steps in prevention and treatment of chronic complications; state  importance of daily self-foot exams; describe how to examine feet and what to look for; explain appropriate eye and dental care  Lifestyle Changes/Goals & Health/Community Resources - state benefits of making appropriate lifestyle changes; identify habits that need to change (meals, tobacco, alcohol); identify strategies to reduce risk factors for personal health; set goals for proper diabetes care; state need for and frequency of healthcare follow-up; describe appropriate community resources for good health (ADA, web sites, apps)   Pregnancy/Sexual Health - define gestational diabetes; state importance of good blood glucose control and birth control prior to pregnancy; state importance of good blood glucose control in preventing sexual problems (impotence, vaginal dryness, infections, loss of desire); state relationship of blood glucose control and pregnancy outcome; describe risk of maternal and fetal complications  Teaching Materials Used: Class 1 Slides/Notebook Diabetes Booklet ID Card  Medic Alert/Medic ID Forms Sleep Evaluation Exercise Handout Daily Food Record Planning a Balanced Meal Goals for Class 1 

## 2016-02-19 ENCOUNTER — Other Ambulatory Visit: Payer: Self-pay | Admitting: Family Medicine

## 2016-02-21 ENCOUNTER — Encounter: Payer: Medicare Other | Admitting: *Deleted

## 2016-02-21 ENCOUNTER — Encounter: Payer: Self-pay | Admitting: *Deleted

## 2016-02-21 VITALS — Wt 198.4 lb

## 2016-02-21 DIAGNOSIS — E119 Type 2 diabetes mellitus without complications: Secondary | ICD-10-CM

## 2016-02-21 NOTE — Progress Notes (Signed)
Appt. Start Time: 0900 Appt. End Time: 1200  Class 2 Diabetes Overview - define DM; state own type of DM; identify functions of pancreas and insulin; define insulin deficiency vs insulin resistance  Psychosocial - identify DM as a source of stress; state the effects of stress on BG control; verbalize appropriate stress management techniques; identify personal stress issues   Nutritional Management - describe effects of food on blood glucose; identify sources of carbohydrate, protein and fat; verbalize the importance of balance meals in controlling blood glucose; identify meals as well balanced or not; estimate servings of carbohydrate from menus; use food labels to identify servings size, content of carbohydrate, fiber, protein, fat, saturated fat and sodium; recognize food sources of fat, saturated fat, trans fat, sodium and verbalize goals for intake; describe healthful appropriate food choices when dining out   Exercise - describe the effects of exercise on blood glucose and importance of regular exercise in controlling diabetes; state a plan for personal exercise; verbalize contraindications for exercise  Medications - state name, dose, timing of currently prescribed medications; describe types of medications available for diabetes  Self-Monitoring - state importance of HBGM and demo procedure accurately; use HBGM results to effectively manage diabetes; identify importance of regular HbA1C testing and goals for results  Acute Complications/Sick Day Guidelines - recognize hyperglycemia and hypoglycemia with causes and effects; identify blood glucose results as high, low or in control; list steps in treating and preventing high and low blood glucose; state appropriate measure to manage blood glucose when ill (need for meds, HBGM plan, when to call physician, need for fluids)  Chronic Complications/Foot, Skin, Eye Dental Care - identify possible long-term complications of diabetes (retinopathy,  neuropathy, nephropathy, cardiovascular disease, infections); explain steps in prevention and treatment of chronic complications; state importance of daily self-foot exams; describe how to examine feet and what to look for; explain appropriate eye and dental care  Lifestyle Changes/Goals & Health/Community Resources - state benefits of making appropriate lifestyle changes; identify habits that need to change (meals, tobacco, alcohol); identify strategies to reduce risk factors for personal health; set goals for proper diabetes care; state need for and frequency of healthcare follow-up; describe appropriate community resources for good health (ADA, web sites, apps)   Pregnancy/Sexual Health - define gestational diabetes; state importance of good blood glucose control and birth control prior to pregnancy; state importance of good blood glucose control in preventing sexual problems (impotence, vaginal dryness, infections, loss of desire); state relationship of blood glucose control and pregnancy outcome; describe risk of maternal and fetal complications  Teaching Materials Used: Class 2 Slide Packet A1C Pamphlet Foot Care Literature Menu Ideas Goals for Class 2  

## 2016-02-27 ENCOUNTER — Other Ambulatory Visit: Payer: Self-pay | Admitting: Pharmacist

## 2016-02-28 ENCOUNTER — Encounter: Payer: Self-pay | Admitting: Dietician

## 2016-02-28 ENCOUNTER — Encounter: Payer: Medicare Other | Admitting: Dietician

## 2016-02-28 ENCOUNTER — Encounter: Payer: Self-pay | Admitting: *Deleted

## 2016-02-28 VITALS — BP 150/82 | Ht 63.0 in | Wt 198.7 lb

## 2016-02-28 DIAGNOSIS — E119 Type 2 diabetes mellitus without complications: Secondary | ICD-10-CM

## 2016-02-28 NOTE — Progress Notes (Signed)
Appt. Start Time: 0900 Appt. End Time: 1200  Class 3 Psychosocial - identify DM as a source of stress; state the effects of stress on BG control; verbalize appropriate stress management techniques; identify personal stress issues   Nutritional Management - describe effects of food on blood glucose; identify sources of carbohydrate, protein and fat; verbalize the importance of balance meals in controlling blood glucose; identify meals as well balanced or not; estimate servings of carbohydrate from menus; use food labels to identify servings size, content of carbohydrate, fiber, protein, fat, saturated fat and sodium; recognize food sources of fat, saturated fat, trans fat, sodium and verbalize goals for intake; describe healthful appropriate food choices when dining out   Exercise - describe the effects of exercise on blood glucose and importance of regular exercise in controlling diabetes; state a plan for personal exercise; verbalize contraindications for exercise  Self-Monitoring - state importance of HBGM and demo procedure accurately; use HBGM results to effectively manage diabetes; identify importance of regular HbA1C testing and goals for results  Acute Complications/Sick Day Guidelines - recognize hyperglycemia and hypoglycemia with causes and effects; identify blood glucose results as high, low or in control; list steps in treating and preventing high and low blood glucose; state appropriate measure to manage blood glucose when ill (need for meds, HBGM plan, when to call physician, need for fluids)  Chronic Complications/Foot, Skin, Eye Dental Care - identify possible long-term complications of diabetes (retinopathy, neuropathy, nephropathy, cardiovascular disease, infections); explain steps in prevention and treatment of chronic complications; state importance of daily self-foot exams; describe how to examine feet and what to look for; explain appropriate eye and dental care  Lifestyle  Changes/Goals & Health/Community Resources - state benefits of making appropriate lifestyle changes; identify habits that need to change (meals, tobacco, alcohol); identify strategies to reduce risk factors for personal health; set goals for proper diabetes care; state need for and frequency of healthcare follow-up; describe appropriate community resources for good health (ADA, web sites, apps)   Teaching Materials Used: Class 3 Slide Packet Diabetes Stress Test Stress Management Tools Stress Poem Recipe/Menu Booklet Nutrition Prescription Fast Food Information Goal Setting Worksheet    

## 2016-02-28 NOTE — Patient Outreach (Signed)
[place] [facility]) Care Management  02/28/2016  SHANIYAH GUERTIN 04-14-1951 [id number]  Patient is a 65 year old female who was initially referred to [place] for assistance with medication adherence.    Phone call completed with patient on 02/27/16 and she verified HIPAA details.    Patient reports that she plans to continue getting medications from [facility] since they do delivery and blister package her medications.  She reports this is helping her remember to take her medications every day.   Patient reports that she has a new behavioral health provider as well.     Patient's last 30 day refill was around 02/11/16.  Patient denies medication questions/concerns at this time.    Plan:  Will place a call to patient in about 2 weeks when next refill is due to ensure that she receives her refills.    If patient has no further pharmacy issues at that time, will consider case closure.    [name], PharmD, [place] [phone]

## 2016-02-29 ENCOUNTER — Other Ambulatory Visit: Payer: Self-pay | Admitting: *Deleted

## 2016-02-29 ENCOUNTER — Encounter: Payer: Self-pay | Admitting: Family Medicine

## 2016-03-02 NOTE — Patient Outreach (Signed)
[place] [facility]) Care Management   Home visit 02/29/16  Gina [name] 07/25/1951 [id number]  Gina [name] is an 65 y.o. female  Subjective:  Pt reports she completed 3 diabetic classes, knows how to use glucometer, checks  Sugars before a meal or 2 hours after.  Pt reports sugar today was 133.   Pt reports is having protein  With her meals.   Pt reports on pain in left knee, use of icy hot helps.    Objective:   Vitals:   02/29/16 1326  BP: 104/60  Pulse: (!) 58  Resp: 20    ROS  Physical Exam  Constitutional: She is oriented to person, place, and time. She appears well-developed and well-nourished.  Cardiovascular: Regular rhythm and normal heart sounds.   HR 58   Respiratory: Effort normal and breath sounds normal.  GI: Soft. Bowel sounds are normal.  Musculoskeletal: Normal range of motion. She exhibits edema.  Trace edema top of right foot.   Neurological: She is alert and oriented to person, place, and time.  Skin: Skin is warm and dry.  Psychiatric: She has a normal mood and affect. Her behavior is normal. Judgment and thought content normal.    Encounter Medications:   Outpatient Encounter Prescriptions as of 02/29/2016  Medication Sig Note  . Cholecalciferol (VITAMIN D) 2000 units CAPS Take 1 capsule by mouth daily.   . diphenhydrAMINE (BENADRYL) 25 mg capsule Take 1 capsule (25 mg total) by mouth daily with supper.   . divalproex (DEPAKOTE) 500 MG DR tablet Take 1 tablet (500 mg total) by mouth 2 (two) times daily with a meal.   . docusate sodium (COLACE) 100 MG capsule TAKE TWO CAPSULES BY MOUTH TWO TIMES A DAY (TAKE WITH BREAKFAST AND SUPPER)   . lovastatin (MEVACOR) 40 MG tablet Take 1 tablet (40 mg total) by mouth daily with supper.   . metFORMIN (GLUCOPHAGE) 500 MG tablet Take 1 tablet (500 mg total) by mouth daily with breakfast.   . Metoprolol Tartrate 37.5 MG TABS Take 37.5 mg by mouth 2 (two) times daily with a meal.   .  OLANZapine (ZYPREXA) 15 MG tablet Take 2 tablets (30 mg total) by mouth daily with supper.   . polyethylene glycol powder (GLYCOLAX/MIRALAX) powder Take 17 g by mouth 2 (two) times daily as needed for moderate constipation. 01/30/2016: As needed   . haloperidol (HALDOL) 5 MG tablet Take 1 tablet (5 mg total) by mouth daily with supper.   . lactulose (CHRONULAC) 10 GM/15ML solution Take 10 g by mouth every other day. Reported on 01/30/2016   . lamoTRIgine (LAMICTAL) 25 MG tablet TAKE 1 TABLET (25 MG TOTAL) BY MOUTH DAILY. (Patient not taking: Reported on 01/30/2016)    No facility-administered encounter medications on file as of 02/29/2016.     Functional Status:   In your present state of health, do you have any difficulty performing the following activities: 01/30/2016 12/25/2015  Hearing? N N  Vision? N N  Difficulty concentrating or making decisions? [name]  Walking or climbing stairs? N N  Dressing or bathing? N Y  Doing errands, shopping? [name]  Preparing Food and eating ? N Y  Using the Toilet? N N  In the past six months, have you accidently leaked urine? N N  Do you have problems with loss of bowel control? N N  Managing your Medications? Y Y  Managing your Finances? N Y  Housekeeping or managing your Housekeeping? [name]  Some encounter information is confidential and restricted. Go to Review Flowsheets activity to see all data.  Some recent data might be hidden    Fall/Depression Screening:    PHQ 2/9 Scores 01/11/2016 12/25/2015 11/29/2015 09/12/2015 06/12/2015 10/11/2014 09/16/2013  PHQ - 2 Score 1 0 - 0 1 0 0  Exception Documentation - - Medical reason - - - -    Assessment:  Pleasant 65 year old woman, resides with sister.                           DM- review of pt's sugars (today 133, Averages: 7 day -132, 14 day-138, 30 day- 131).                         Chronic left knee pain: per pt +10 today, use of icy hot helps.   Plan:  As discussed with pt, plan to discharge from community nurse  case management services-              Goals met.              Plan to inform [facility] pharmacist (still active) of discharge.               Plan to inform Dr. Danise Mina by in basket  of discharge from community nurse case                Management services.     [facility] CM Care Plan Problem One   Flowsheet Row Most Recent Value  Care Plan Problem One  Knowledge deficit related to diabetic diet   Role Documenting the Problem One  Care Management Coordinator  Care Plan for Problem One  Active  [facility] Long Term Goal (31-90 days)  Pt would have better understanding of diabetic diet in the next 60 days   [facility] Long Term Goal Start Date  12/28/15  [facility] Long Term Goal Met Date  02/29/16    [facility] CM Care Plan Problem Two   Flowsheet Row Most Recent Value  Care Plan Problem Two  Swelling/pain top of left foot/edema top of right foot   Role Documenting the Problem Two  Care Management Coordinator  Care Plan for Problem Two  Active  [facility] CM Short Term Goal #1 (0-30 days)  Swelling/pain would resolve in the next 30 days   [facility] CM Short Term Goal #1 Start Date  01/30/16  [facility] CM Short Term Goal #1 Met Date   02/29/16     Zara Chess.   [facility] Management  [phone]

## 2016-03-04 ENCOUNTER — Encounter: Payer: Self-pay | Admitting: *Deleted

## 2016-03-06 ENCOUNTER — Ambulatory Visit: Payer: Self-pay | Admitting: Family Medicine

## 2016-03-12 ENCOUNTER — Other Ambulatory Visit: Payer: Self-pay | Admitting: Pharmacist

## 2016-03-12 DIAGNOSIS — Z23 Encounter for immunization: Secondary | ICD-10-CM | POA: Diagnosis not present

## 2016-03-12 NOTE — Patient Outreach (Signed)
[place] [facility]) Care Management  03/12/2016  Gina [name] September 19, 1950 [id number]  Follow-up call with patient for medication adherence.  Patient verified HIPAA details.   Patient states she has been doing much better taking her medications with the delivery and blister packing of her medications that her current pharmacy choice, [place] provides her with.   Patient states that she is expecting her refills this week and she states that her new behavioral health provider will be prescribing her behavioral health medications.  Patient denies other pharmacy related concerns or questions at this time.   Plan:  Will close pharmacy case at this time as patient denies further concerns and her adherence, based on refill dates from her local pharmacy has improved.   Will send case closure letter to patient's PCP.    [name], PharmD, [place] [phone]

## 2016-03-13 ENCOUNTER — Encounter: Payer: Self-pay | Admitting: Pharmacist

## 2016-03-14 ENCOUNTER — Encounter: Payer: Self-pay | Admitting: Pharmacist

## 2016-03-19 ENCOUNTER — Encounter: Payer: Self-pay | Admitting: Family Medicine

## 2016-03-19 ENCOUNTER — Ambulatory Visit (INDEPENDENT_AMBULATORY_CARE_PROVIDER_SITE_OTHER): Payer: Medicare Other | Admitting: Family Medicine

## 2016-03-19 VITALS — BP 128/78 | HR 56 | Temp 97.6°F | Wt 197.5 lb

## 2016-03-19 DIAGNOSIS — N183 Chronic kidney disease, stage 3 unspecified: Secondary | ICD-10-CM

## 2016-03-19 DIAGNOSIS — E1121 Type 2 diabetes mellitus with diabetic nephropathy: Secondary | ICD-10-CM | POA: Diagnosis not present

## 2016-03-19 DIAGNOSIS — IMO0001 Reserved for inherently not codable concepts without codable children: Secondary | ICD-10-CM

## 2016-03-19 DIAGNOSIS — E1165 Type 2 diabetes mellitus with hyperglycemia: Secondary | ICD-10-CM

## 2016-03-19 DIAGNOSIS — IMO0002 Reserved for concepts with insufficient information to code with codable children: Secondary | ICD-10-CM

## 2016-03-19 DIAGNOSIS — F3112 Bipolar disorder, current episode manic without psychotic features, moderate: Secondary | ICD-10-CM

## 2016-03-19 DIAGNOSIS — Z23 Encounter for immunization: Secondary | ICD-10-CM | POA: Diagnosis not present

## 2016-03-19 DIAGNOSIS — K7581 Nonalcoholic steatohepatitis (NASH): Secondary | ICD-10-CM

## 2016-03-19 LAB — RENAL FUNCTION PANEL
Albumin: 4.1 g/dL (ref 3.5–5.2)
BUN: 39 mg/dL — AB (ref 6–23)
CALCIUM: 9.6 mg/dL (ref 8.4–10.5)
CHLORIDE: 103 meq/L (ref 96–112)
CO2: 29 meq/L (ref 19–32)
CREATININE: 1.58 mg/dL — AB (ref 0.40–1.20)
GFR: 34.8 mL/min — ABNORMAL LOW (ref >60.00)
Glucose, Bld: 241 mg/dL — ABNORMAL HIGH (ref 70–99)
PHOSPHORUS: 4 mg/dL (ref 2.3–4.6)
Potassium: 4.9 mEq/L (ref 3.5–5.1)
Sodium: 139 mEq/L (ref 135–145)

## 2016-03-19 LAB — MICROALBUMIN / CREATININE URINE RATIO
Creatinine,U: 41 mg/dL
Microalb Creat Ratio: 1.7 mg/g (ref 0.0–30.0)
Microalb, Ur: <0.7 mg/dL (ref 0.0–1.9)

## 2016-03-19 LAB — HEMOGLOBIN A1C: HEMOGLOBIN A1C: 6.8 % — AB (ref 4.6–6.5)

## 2016-03-19 NOTE — Assessment & Plan Note (Signed)
Lab Results  Component Value Date   ALT 32 12/17/2015   AST 33 12/17/2015   ALKPHOS 94 12/17/2015   BILITOT 0.7 12/17/2015

## 2016-03-19 NOTE — Assessment & Plan Note (Addendum)
Doing better with pill packs. Log she brings shows high fasting readings. Will check A1c.

## 2016-03-19 NOTE — Patient Instructions (Addendum)
prevnar shot today (first pneumonia shot). Labs today Try to increase walking at home for goal weight loss.

## 2016-03-19 NOTE — Progress Notes (Signed)
BP 128/78   Pulse (!) 56   Temp 97.6 F (36.4 C) (Oral)   Wt 197 lb 8 oz (89.6 kg)   BMI 36.12 kg/m    CC: f/u visit Subjective:    Patient ID: Gina [name], female    DOB: 1951/03/06, 65 y.o.   MRN: [id number]  HPI: Gina [name] is a 65 y.o. female presenting on 03/19/2016 for Follow-up   [facility] care management recently discharged patient, she was started on monthly pill packs and feels this is working well for her.   DM - Brings log of sugars - fasting fanging from 130s-200s. Compliant with antihyperglycemic regimen which includes: metformin 500mg  once daily.  Denies low sugars or hypoglycemic symptoms.  Denies paresthesias. Last diabetic eye exam 11/2015.  Pneumovax: unsure. Prevnar: today.Completed diabetes education.  Lab Results  Component Value Date   HGBA1C 6.9 (H) 12/04/2015    Diabetic Foot Exam - Simple   Simple Foot Form Diabetic Foot exam was performed with the following findings:  Yes 03/19/2016 10:35 AM  Visual Inspection No deformities, no ulcerations, no other skin breakdown bilaterally:  Yes Sensation Testing Intact to touch and monofilament testing bilaterally:  Yes Pulse Check Posterior Tibialis and Dorsalis pulse intact bilaterally:  Yes Comments        Relevant past medical, surgical, family and social history reviewed and updated as indicated. Interim medical history since our last visit reviewed. Allergies and medications reviewed and updated. Current Outpatient Prescriptions on File Prior to Visit  Medication Sig  . Cholecalciferol (VITAMIN D) 2000 units CAPS Take 1 capsule by mouth daily.  . diphenhydrAMINE (BENADRYL) 25 mg capsule Take 1 capsule (25 mg total) by mouth daily with supper.  . divalproex (DEPAKOTE) 500 MG DR tablet Take 1 tablet (500 mg total) by mouth 2 (two) times daily with a meal.  . docusate sodium (COLACE) 100 MG capsule TAKE TWO CAPSULES BY MOUTH TWO TIMES A DAY (TAKE WITH BREAKFAST AND SUPPER)  . haloperidol  (HALDOL) 5 MG tablet Take 1 tablet (5 mg total) by mouth daily with supper.  . lactulose (CHRONULAC) 10 GM/15ML solution Take 10 g by mouth every other day. Reported on 01/30/2016  . lamoTRIgine (LAMICTAL) 25 MG tablet TAKE 1 TABLET (25 MG TOTAL) BY MOUTH DAILY.  Marland Kitchen lovastatin (MEVACOR) 40 MG tablet Take 1 tablet (40 mg total) by mouth daily with supper.  . metFORMIN (GLUCOPHAGE) 500 MG tablet Take 1 tablet (500 mg total) by mouth daily with breakfast.  . Metoprolol Tartrate 37.5 MG TABS Take 37.5 mg by mouth 2 (two) times daily with a meal.  . OLANZapine (ZYPREXA) 15 MG tablet Take 2 tablets (30 mg total) by mouth daily with supper.  . polyethylene glycol powder (GLYCOLAX/MIRALAX) powder Take 17 g by mouth 2 (two) times daily as needed for moderate constipation.   No current facility-administered medications on file prior to visit.     Review of Systems Per HPI unless specifically indicated in ROS section     Objective:    BP 128/78   Pulse (!) 56   Temp 97.6 F (36.4 C) (Oral)   Wt 197 lb 8 oz (89.6 kg)   BMI 36.12 kg/m   Wt Readings from Last 3 Encounters:  03/19/16 197 lb 8 oz (89.6 kg)  02/29/16 197 lb 11.2 oz (89.7 kg)  02/28/16 198 lb 11.2 oz (90.1 kg)    Physical Exam  Constitutional: She appears well-developed and well-nourished. No distress.  HENT:  Head: Normocephalic  and atraumatic.  Right Ear: External ear normal.  Left Ear: External ear normal.  Nose: Nose normal.  Mouth/Throat: Oropharynx is clear and moist. No oropharyngeal exudate.  Eyes: Conjunctivae and EOM are normal. Pupils are equal, round, and reactive to light. No scleral icterus.  Neck: Normal range of motion. Neck supple.  Cardiovascular: Normal rate, regular rhythm, normal heart sounds and intact distal pulses.   No murmur heard. Pulmonary/Chest: Effort normal and breath sounds normal. No respiratory distress. She has no wheezes. She has no rales.  Musculoskeletal: She exhibits no edema.  See HPI  for foot exam if done  Lymphadenopathy:    She has no cervical adenopathy.  Skin: Skin is warm and dry. No rash noted.  Psychiatric: She has a normal mood and affect.  Nursing note and vitals reviewed.  Results for orders placed or performed in visit on 01/25/16  HM MAMMOGRAPHY  Result Value Ref Range   HM Mammogram 0-4 Bi-Rad 0-4 Bi-Rad, Self Reported Normal      Assessment & Plan:   Problem List Items Addressed This Visit    Bipolar disorder, manic, moderate ([place])    Appreciate psych care. Doing better with med compliance with pill packs.      CKD (chronic kidney disease) stage 3, GFR 30-59 ml/min    Update renal panel. Monitor metformin dose.       Relevant Orders   Renal function panel   NASH (nonalcoholic steatohepatitis)    Lab Results  Component Value Date   ALT 32 12/17/2015   AST 33 12/17/2015   ALKPHOS 94 12/17/2015   BILITOT 0.7 12/17/2015         Obesity, Class II, BMI 35-39.9, with comorbidity ([place])    Discussed healthy diet and lifestyle changes to affect sustainable weight loss, encouraged increased walking at home.      Uncontrolled type 2 diabetes mellitus with nephropathy ([place]) - Primary    Doing better with pill packs. Log she brings shows high fasting readings. Will check A1c.      Relevant Orders   Renal function panel   Hemoglobin A1c   Microalbumin / creatinine urine ratio    Other Visit Diagnoses   None.      Follow up plan: Return in about 3 months (around 06/19/2016), or as neede, for follow up visit.  Ria Bush, MD

## 2016-03-19 NOTE — Assessment & Plan Note (Signed)
Update renal panel. Monitor metformin dose.

## 2016-03-19 NOTE — Progress Notes (Signed)
Pre visit review using our clinic review tool, if applicable. No additional management support is needed unless otherwise documented below in the visit note. 

## 2016-03-19 NOTE — Assessment & Plan Note (Signed)
Discussed healthy diet and lifestyle changes to affect sustainable weight loss, encouraged increased walking at home.

## 2016-03-19 NOTE — Assessment & Plan Note (Addendum)
Appreciate psych care. Doing better with med compliance with pill packs.

## 2016-03-19 NOTE — Addendum Note (Signed)
Addended by: Royann Shivers A on: 03/19/2016 11:53 AM   Modules accepted: Orders

## 2016-03-24 ENCOUNTER — Encounter: Payer: Self-pay | Admitting: *Deleted

## 2016-03-26 ENCOUNTER — Telehealth: Payer: Self-pay | Admitting: Family Medicine

## 2016-03-26 NOTE — Telephone Encounter (Signed)
Pt has appt 03/27/16 at 8 AM with Dr Deborra Medina.
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
## 2016-03-27 ENCOUNTER — Ambulatory Visit: Payer: Self-pay | Admitting: Family Medicine

## 2016-04-02 ENCOUNTER — Ambulatory Visit (INDEPENDENT_AMBULATORY_CARE_PROVIDER_SITE_OTHER): Payer: Medicare Other | Admitting: Family Medicine

## 2016-04-02 ENCOUNTER — Encounter: Payer: Self-pay | Admitting: Family Medicine

## 2016-04-02 VITALS — BP 138/60 | HR 60 | Temp 98.0°F | Wt 199.8 lb

## 2016-04-02 DIAGNOSIS — T24211A Burn of second degree of right thigh, initial encounter: Secondary | ICD-10-CM | POA: Diagnosis not present

## 2016-04-02 DIAGNOSIS — K648 Other hemorrhoids: Secondary | ICD-10-CM | POA: Diagnosis not present

## 2016-04-02 DIAGNOSIS — K644 Residual hemorrhoidal skin tags: Secondary | ICD-10-CM | POA: Insufficient documentation
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
## 2016-04-02 NOTE — Progress Notes (Signed)
Pre visit review using our clinic review tool, if applicable. No additional management support is needed unless otherwise documented below in the visit note. 

## 2016-04-02 NOTE — Assessment & Plan Note (Signed)
Seems to be healing well. Mild surrounding erythema - so will treat with triple abx ointment and bandage daily. Discussed wound care with patient, supplies provided. Red flags to return discussed.

## 2016-04-02 NOTE — Assessment & Plan Note (Addendum)
Treat with anusol hc cream.  Discussed avoiding straining, prolonged time on toilet.

## 2016-04-02 NOTE — Telephone Encounter (Signed)
This encounter was created in error - please disregard.

## 2016-04-02 NOTE — Patient Instructions (Addendum)
Daily dressing changes for next few days - should continue to heal well. Try hydrocortisone anusol hc cream for hemorrhoids.  Return in 2 months for medicare wellness visit

## 2016-04-22 DIAGNOSIS — Z79899 Other long term (current) drug therapy: Secondary | ICD-10-CM | POA: Diagnosis not present

## 2016-04-22 LAB — COMPREHENSIVE METABOLIC PANEL
ALT: 18
AST: 22 U/L
Alkaline Phosphatase: 91 U/L
BILIRUBIN, TOTAL: 0.2
BUN: 27 mg/dL — AB (ref 4–21)
CREATININE: 1.48
GFR CALC NON AF AMER: 37
Glucose: 136
POTASSIUM: 4.9 mmol/L
Sodium: 142

## 2016-04-22 LAB — CBC
HGB: 11.6 g/dL
WBC: 6.4
platelet count: 108

## 2016-04-22 LAB — VALPROIC ACID LEVEL: VALPROIC ACID LVL: 58

## 2016-04-30 DIAGNOSIS — G4733 Obstructive sleep apnea (adult) (pediatric): Secondary | ICD-10-CM | POA: Diagnosis not present

## 2016-05-09 ENCOUNTER — Other Ambulatory Visit: Payer: Self-pay | Admitting: Family Medicine

## 2016-05-13 ENCOUNTER — Other Ambulatory Visit: Payer: Self-pay | Admitting: Family Medicine

## 2016-06-04 ENCOUNTER — Encounter: Payer: Self-pay | Admitting: Family Medicine

## 2016-06-04 ENCOUNTER — Ambulatory Visit (INDEPENDENT_AMBULATORY_CARE_PROVIDER_SITE_OTHER): Payer: Medicare Other | Admitting: Family Medicine

## 2016-06-04 VITALS — BP 134/82 | HR 68 | Temp 97.6°F | Ht 61.5 in | Wt 195.8 lb

## 2016-06-04 DIAGNOSIS — Z7189 Other specified counseling: Secondary | ICD-10-CM | POA: Insufficient documentation

## 2016-06-04 DIAGNOSIS — E1121 Type 2 diabetes mellitus with diabetic nephropathy: Secondary | ICD-10-CM

## 2016-06-04 DIAGNOSIS — I10 Essential (primary) hypertension: Secondary | ICD-10-CM

## 2016-06-04 DIAGNOSIS — IMO0001 Reserved for inherently not codable concepts without codable children: Secondary | ICD-10-CM

## 2016-06-04 DIAGNOSIS — E785 Hyperlipidemia, unspecified: Secondary | ICD-10-CM

## 2016-06-04 DIAGNOSIS — Z0000 Encounter for general adult medical examination without abnormal findings: Secondary | ICD-10-CM

## 2016-06-04 DIAGNOSIS — E669 Obesity, unspecified: Secondary | ICD-10-CM | POA: Diagnosis not present

## 2016-06-04 DIAGNOSIS — G4733 Obstructive sleep apnea (adult) (pediatric): Secondary | ICD-10-CM

## 2016-06-04 DIAGNOSIS — Z9989 Dependence on other enabling machines and devices: Secondary | ICD-10-CM

## 2016-06-04 DIAGNOSIS — F3112 Bipolar disorder, current episode manic without psychotic features, moderate: Secondary | ICD-10-CM

## 2016-06-04 NOTE — Progress Notes (Signed)
BP 134/82   Pulse 68   Temp 97.6 F (36.4 C) (Oral)   Ht 5' 1.5" (1.562 m)   Wt 195 lb 12 oz (88.8 kg)   BMI 36.39 kg/m    CC: medicare wellness visit Subjective:    Patient ID: Gina [name], female    DOB: 1950/10/06, 65 y.o.   MRN: [id number]  HPI: Gina [name] is a 65 y.o. female presenting on 06/04/2016 for Annual Exam   Sees nurse practitioner and counselor who comes out to the house once a month.  OSA on CPAP followed by Dr [name].  Brings sugar log - fasting 95-130s over the past month.  Diabetic Foot Exam - Simple   Simple Foot Form Diabetic Foot exam was performed with the following findings:  Yes 06/04/2016 10:29 AM  Visual Inspection No deformities, no ulcerations, no other skin breakdown bilaterally:  Yes Sensation Testing Intact to touch and monofilament testing bilaterally:  Yes Pulse Check Posterior Tibialis and Dorsalis pulse intact bilaterally:  Yes Comments      Vision screen - done at eye center 02/2016 Failed R hearing screen, pt denies trouble. No falls in past year. Depression screen - negative.   Preventative: H/o colon cancer s/p colectomy 2008.  COLONOSCOPY 02/2015; TA, diverticulosis, rpt 2 yrs [name]) Well woman exam - she is s/p hysterectomy and LSO 1981, pt states there may have been some concern for uterine cancer - all normal paps in past. Last normal 2016. Will stop screening Mammogram with biopsy - fibroadenoma 01/2014. mammo WNL 01/2016 Flu shot yearly Tetanus shot allergy in chart, pt states may have caused small rash - consider retrial in future. prevnar 03/2016 Shingles - declines. Advanced directives: has at home. Would want HC proxy to be nephew [name]. asked to bring me copy Seat belt use discussed Sunscreen use discussed. No changing moles on skin Non smoker Alcohol - none  Lives with sister, 1 chihuahua  Occupation: disabled, for bipolar and arthritis  Edu: GED  Activity: takes walks  Diet: good  water, vegetables daily, some fruits   Relevant past medical, surgical, family and social history reviewed and updated as indicated. Interim medical history since our last visit reviewed. Allergies and medications reviewed and updated. Current Outpatient Prescriptions on File Prior to Visit  Medication Sig  . Cholecalciferol (VITAMIN D) 2000 units CAPS Take 1 capsule by mouth daily.  . diphenhydrAMINE (BENADRYL) 25 mg capsule Take 1 capsule (25 mg total) by mouth daily with supper.  . divalproex (DEPAKOTE) 500 MG DR tablet Take 1 tablet (500 mg total) by mouth 2 (two) times daily with a meal.  . docusate sodium (COLACE) 100 MG capsule TAKE TWO CAPSULES BY MOUTH TWO TIMES A DAY  . haloperidol (HALDOL) 5 MG tablet Take 1 tablet (5 mg total) by mouth daily with supper.  . lactulose (CHRONULAC) 10 GM/15ML solution Take 10 g by mouth every other day. Reported on 01/30/2016  . lamoTRIgine (LAMICTAL) 25 MG tablet TAKE 1 TABLET (25 MG TOTAL) BY MOUTH DAILY.  Marland Kitchen lovastatin (MEVACOR) 40 MG tablet Take 1 tablet (40 mg total) by mouth daily with supper.  . metFORMIN (GLUCOPHAGE) 500 MG tablet Take 1 tablet (500 mg total) by mouth daily with breakfast.  . Metoprolol Tartrate 37.5 MG TABS TAKE 1 TABLET BY MOUTH TWICE A DAY [facility] AND SUPPER)  . OLANZapine (ZYPREXA) 15 MG tablet Take 2 tablets (30 mg total) by mouth daily with supper.  . polyethylene glycol powder (GLYCOLAX/MIRALAX) powder  Take 17 g by mouth 2 (two) times daily as needed for moderate constipation.   No current facility-administered medications on file prior to visit.     Review of Systems Per HPI unless specifically indicated in ROS section     Objective:    BP 134/82   Pulse 68   Temp 97.6 F (36.4 C) (Oral)   Ht 5' 1.5" (1.562 m)   Wt 195 lb 12 oz (88.8 kg)   BMI 36.39 kg/m   Wt Readings from Last 3 Encounters:  06/04/16 195 lb 12 oz (88.8 kg)  04/02/16 199 lb 12 oz (90.6 kg)  03/19/16 197 lb 8 oz (89.6 kg)      Physical Exam  Constitutional: She is oriented to person, place, and time. She appears well-developed and well-nourished. No distress.  HENT:  Head: Normocephalic and atraumatic.  Right Ear: Hearing, tympanic membrane, external ear and ear canal normal.  Left Ear: Hearing, tympanic membrane, external ear and ear canal normal.  Nose: Nose normal.  Mouth/Throat: Uvula is midline, oropharynx is clear and moist and mucous membranes are normal. No oropharyngeal exudate, posterior oropharyngeal edema or posterior oropharyngeal erythema.  Eyes: Conjunctivae and EOM are normal. Pupils are equal, round, and reactive to light. No scleral icterus.  Neck: Normal range of motion. Neck supple. No thyromegaly present.  Cardiovascular: Normal rate, regular rhythm, normal heart sounds and intact distal pulses.   No murmur heard. Pulses:      Radial pulses are 2+ on the right side, and 2+ on the left side.  Pulmonary/Chest: Effort normal and breath sounds normal. No respiratory distress. She has no wheezes. She has no rales.  Abdominal: Soft. Bowel sounds are normal. She exhibits no distension and no mass. There is no tenderness. There is no rebound and no guarding.  Musculoskeletal: Normal range of motion. She exhibits no edema.  Lymphadenopathy:    She has no cervical adenopathy.  Neurological: She is alert and oriented to person, place, and time.  CN grossly intact, station and gait intact Recall 3/3 Calculation 4/5 serial 3s  Skin: Skin is warm and dry. No rash noted.  Psychiatric: She has a normal mood and affect. Her behavior is normal. Judgment and thought content normal.  Nursing note and vitals reviewed.  Results for orders placed or performed in visit on 03/19/16  Renal function panel  Result Value Ref Range   Sodium 139 135 - 145 mEq/L   Potassium 4.9 3.5 - 5.1 mEq/L   Chloride 103 96 - 112 mEq/L   CO2 29 19 - 32 mEq/L   Calcium 9.6 8.4 - 10.5 mg/dL   Albumin 4.1 3.5 - 5.2 g/dL   BUN  39 (H) 6 - 23 mg/dL   Creatinine, Ser 1.58 (H) 0.40 - 1.20 mg/dL   Glucose, Bld 241 (H) 70 - 99 mg/dL   Phosphorus 4.0 2.3 - 4.6 mg/dL   GFR 34.80 (L) >60.00 mL/min  Hemoglobin A1c  Result Value Ref Range   Hgb A1c MFr Bld 6.8 (H) 4.6 - 6.5 %  Microalbumin / creatinine urine ratio  Result Value Ref Range   Microalb, Ur <0.7 0.0 - 1.9 mg/dL   Creatinine,U 41.0 mg/dL   Microalb Creat Ratio 1.7 0.0 - 30.0 mg/g      Assessment & Plan:   Problem List Items Addressed This Visit    Advanced care planning/counseling discussion    Advanced directives: has at home. Would want HC proxy to be nephew [name]. asked to  bring me copy      Bipolar disorder, manic, moderate ([facility])    Stable on current regimen. Continue. Followed by psych - per patient NP and counselor go to house monthly.      Controlled type 2 diabetes mellitus with diabetic nephropathy ([facility])    Chronic, stable. Good control based on sugar log she brings today. Foot exam today.       HTN (hypertension), benign    Chronic, stable. Continue metoprolol 37.5mg  bid.      Hyperlipidemia    Chronic, continue lovastatin. Check FLP next fasting labs      Medicare annual wellness visit, subsequent - Primary    I have personally reviewed the Medicare Annual Wellness questionnaire and have noted 1. The patient's medical and social history 2. Their use of alcohol, tobacco or illicit drugs 3. Their current medications and supplements 4. The patient's functional ability including ADL's, fall risks, home safety risks and hearing or visual impairment. Cognitive function has been assessed and addressed as indicated.  5. Diet and physical activity 6. Evidence for depression or mood disorders The patients weight, height, BMI have been recorded in the chart. I have made referrals, counseling and provided education to the patient based on review of the above and I have provided the pt with a written personalized care plan for preventive  services. Provider list updated.. See scanned questionairre as needed for further documentation. Reviewed preventative protocols and updated unless pt declined.       Obesity, Class II, BMI 35-39.9, with comorbidity ([place])    Encouraged she increase walking regimen for goal weight loss      OSA on CPAP    Reports compliance with CPAP , followed by Dr [name]       Other Visit Diagnoses   None.      Follow up plan: Return in about 3 months (around 09/04/2016), or as needed, for follow up visit.  Ria Bush, MD

## 2016-06-04 NOTE — Patient Instructions (Addendum)
Sugars are doing well! Continue metformin. Sugar log printed out today. Continue metformin 500mg  once daily - let me know if you're taking more frequently.  Bring me copy of your living will to update your chart. Return in 3 months for fasting labs and afterwards for follow up visit.

## 2016-06-04 NOTE — Assessment & Plan Note (Addendum)
Stable on current regimen. Continue. Followed by psych - per patient NP and counselor go to house monthly.

## 2016-06-04 NOTE — Assessment & Plan Note (Signed)
Chronic, stable. Good control based on sugar log she brings today. Foot exam today.

## 2016-06-04 NOTE — Assessment & Plan Note (Signed)
Encouraged she increase walking regimen for goal weight loss

## 2016-06-04 NOTE — Assessment & Plan Note (Signed)
Reports compliance with CPAP , followed by Dr [name]

## 2016-06-04 NOTE — Assessment & Plan Note (Signed)
Advanced directives: has at home. Would want HC proxy to be nephew [name]. asked to bring me copy

## 2016-06-04 NOTE — Assessment & Plan Note (Signed)

## 2016-06-04 NOTE — Assessment & Plan Note (Signed)
Chronic, continue lovastatin. Check FLP next fasting labs

## 2016-06-04 NOTE — Progress Notes (Signed)
Pre visit review using our clinic review tool, if applicable. No additional management support is needed unless otherwise documented below in the visit note. 

## 2016-06-04 NOTE — Assessment & Plan Note (Signed)
Chronic, stable. Continue metoprolol 37.5mg  bid.
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
## 2016-06-15 ENCOUNTER — Encounter: Payer: Self-pay | Admitting: Family Medicine

## 2016-06-15 DIAGNOSIS — D696 Thrombocytopenia, unspecified: Secondary | ICD-10-CM | POA: Insufficient documentation

## 2016-07-08 ENCOUNTER — Encounter: Payer: Self-pay | Admitting: Family Medicine

## 2016-07-09 ENCOUNTER — Encounter: Payer: Self-pay | Admitting: *Deleted

## 2016-07-09 ENCOUNTER — Other Ambulatory Visit: Payer: Self-pay

## 2016-07-09 NOTE — Telephone Encounter (Signed)
Pt left v/m requesting powder for laxative (not sure of name) ? miralax to [facility].last refilled # [id number] g x 1 on 01/02/16. Last annual 06/04/16; but I did not see reviewed in that note.Please advise.

## 2016-07-10 MED ORDER — POLYETHYLENE GLYCOL 3350 17 GM/SCOOP PO POWD: 17.0000 g | Freq: Two times a day (BID) | ORAL | 1 refills | Status: DC | PRN

## 2016-07-10 NOTE — Telephone Encounter (Signed)
Sent. Thanks.   

## 2016-07-11 ENCOUNTER — Telehealth: Payer: Self-pay | Admitting: *Deleted

## 2016-07-11 NOTE — Telephone Encounter (Signed)
Faxed form received requiring additional documentation to justify prescribed quantity as it exceeds Medicare utilization parameters.  Form placed in Dr. Marliss Coots In [place].

## 2016-07-14 ENCOUNTER — Telehealth: Payer: Self-pay | Admitting: *Deleted

## 2016-07-14 NOTE — Telephone Encounter (Signed)
OV notes and labs faxed

## 2016-07-14 NOTE — Telephone Encounter (Signed)
Patient called stating that she need Dr. Darnell Level to order her weekly medications. Patient stated that she gets her medications weekly from [facility]. Advised patient that she will need to call the pharmacy and have them send her medications to her for the week. Advised patient to tell the pharmacy if they need anything from Dr. Darnell Level to contact the office. Patient verbalized understanding.

## 2016-07-14 NOTE — Telephone Encounter (Signed)
Please send the last 2 notes from Dr Darnell Level and sets of labs that address her diabetes -thanks  Will put in the IN box

## 2016-07-31 ENCOUNTER — Other Ambulatory Visit: Payer: Self-pay | Admitting: Family Medicine

## 2016-08-11 ENCOUNTER — Telehealth: Payer: Self-pay

## 2016-08-11 NOTE — Telephone Encounter (Signed)
Pt needs new cpap machine and last received cpap was when pt was in [place]. Pt was not sure if PCP would do that would pulmonology get machine. I spoke with Caryl Pina at Dr Leane Platt office and she will send note to nurse and the nurse will call pt back. Pt voiced understanding.

## 2016-09-03 ENCOUNTER — Other Ambulatory Visit: Payer: Self-pay | Admitting: Family Medicine

## 2016-09-03 ENCOUNTER — Other Ambulatory Visit (INDEPENDENT_AMBULATORY_CARE_PROVIDER_SITE_OTHER): Payer: Medicare Other

## 2016-09-03 DIAGNOSIS — E1121 Type 2 diabetes mellitus with diabetic nephropathy: Secondary | ICD-10-CM

## 2016-09-03 DIAGNOSIS — N183 Chronic kidney disease, stage 3 unspecified: Secondary | ICD-10-CM

## 2016-09-03 DIAGNOSIS — F3110 Bipolar disorder, current episode manic without psychotic features, unspecified: Secondary | ICD-10-CM

## 2016-09-03 LAB — RENAL FUNCTION PANEL
Albumin: 4.3 g/dL (ref 3.5–5.2)
BUN: 29 mg/dL — ABNORMAL HIGH (ref 6–23)
CO2: 29 mEq/L (ref 19–32)
Calcium: 9.7 mg/dL (ref 8.4–10.5)
Chloride: 103 mEq/L (ref 96–112)
Creatinine, Ser: 1.45 mg/dL — ABNORMAL HIGH (ref 0.40–1.20)
GFR: 38.37 mL/min — ABNORMAL LOW (ref >60.00)
Glucose, Bld: 154 mg/dL — ABNORMAL HIGH (ref 70–99)
Phosphorus: 4 mg/dL (ref 2.3–4.6)
Potassium: 4.9 mEq/L (ref 3.5–5.1)
Sodium: 140 mEq/L (ref 135–145)

## 2016-09-03 LAB — HEMOGLOBIN A1C: Hgb A1c MFr Bld: 6.3 % (ref 4.6–6.5)

## 2016-09-03 LAB — TSH: TSH: 4.57 u[IU]/mL — AB (ref 0.35–4.50)

## 2016-09-05 ENCOUNTER — Ambulatory Visit: Payer: Self-pay | Admitting: Family Medicine

## 2016-09-10 ENCOUNTER — Ambulatory Visit (INDEPENDENT_AMBULATORY_CARE_PROVIDER_SITE_OTHER): Payer: Medicare Other | Admitting: Family Medicine

## 2016-09-10 ENCOUNTER — Encounter: Payer: Self-pay | Admitting: Family Medicine

## 2016-09-10 VITALS — BP 122/64 | HR 63 | Temp 98.1°F | Resp 16 | Ht 62.0 in | Wt 199.0 lb

## 2016-09-10 DIAGNOSIS — E1121 Type 2 diabetes mellitus with diabetic nephropathy: Secondary | ICD-10-CM | POA: Diagnosis not present

## 2016-09-10 DIAGNOSIS — N183 Chronic kidney disease, stage 3 unspecified: Secondary | ICD-10-CM

## 2016-09-10 DIAGNOSIS — E039 Hypothyroidism, unspecified: Secondary | ICD-10-CM | POA: Insufficient documentation

## 2016-09-10 DIAGNOSIS — I10 Essential (primary) hypertension: Secondary | ICD-10-CM

## 2016-09-10 DIAGNOSIS — R946 Abnormal results of thyroid function studies: Secondary | ICD-10-CM

## 2016-09-10 DIAGNOSIS — R7989 Other specified abnormal findings of blood chemistry: Secondary | ICD-10-CM

## 2016-09-10 DIAGNOSIS — F3110 Bipolar disorder, current episode manic without psychotic features, unspecified: Secondary | ICD-10-CM | POA: Diagnosis not present

## 2016-09-10 NOTE — Assessment & Plan Note (Addendum)
TSH mildly elevated - rpt at next labs.

## 2016-09-10 NOTE — Assessment & Plan Note (Signed)
Chronic, stable. Controlled based on recent A1c as well as log she brings. Continue metformin, caution with CKD.

## 2016-09-10 NOTE — Assessment & Plan Note (Addendum)
Progressive since 2013.  Reviewed with patient, discussed importance of good hydration status.  Consider updated renal US.

## 2016-09-10 NOTE — Progress Notes (Signed)
BP 122/64   Pulse 63   Temp 98.1 F (36.7 C) (Oral)   Resp 16   Ht 5\' 2"  (1.575 m)   Wt 199 lb (90.3 kg)   SpO2 94%   BMI 36.40 kg/m    CC: 96mo f/u visit Subjective:    Patient ID: Gina [name], female    DOB: 12/21/1950, 66 y.o.   MRN: [id number]  HPI: Gina [name] is a 66 y.o. female presenting on 09/10/2016 for Follow-up (dm II, HTN)   Bipolar disorder - followed by psychiatry (sees NP and counselor with ACT team who comes out of the house once a month). On olanzapine 30mg  QHS, depakote 500mg  BID, haldol 5mg  QHS, lamictal 25mg  daily. Benadryl was recently stopped.   DM - regularly does check fasting sugars and brings log. Compliant with antihyperglycemic regimen which includes: metformin 500mg  daily. Denies low sugars or hypoglycemic symptoms.  Denies paresthesias. Last diabetic eye exam 11/2015.  Pneumovax: 03/2016.  Prevnar: not due yet. Sugar log reviewed - averaging 100-130s.  Lab Results  Component Value Date   HGBA1C 6.3 09/03/2016   Diabetic Foot Exam - Simple   No data filed        Relevant past medical, surgical, family and social history reviewed and updated as indicated. Interim medical history since our last visit reviewed. Allergies and medications reviewed and updated. Current Outpatient Prescriptions on File Prior to Visit  Medication Sig  . Cholecalciferol (VITAMIN D) 2000 units CAPS Take 1 capsule by mouth daily.  . diphenhydrAMINE (BENADRYL) 25 mg capsule Take 1 capsule (25 mg total) by mouth daily with supper.  . divalproex (DEPAKOTE) 500 MG DR tablet Take 1 tablet (500 mg total) by mouth 2 (two) times daily with a meal.  . docusate sodium (COLACE) 100 MG capsule TAKE TWO CAPSULES BY MOUTH TWO TIMES A DAY  . haloperidol (HALDOL) 5 MG tablet Take 1 tablet (5 mg total) by mouth daily with supper.  . lactulose (CHRONULAC) 10 GM/15ML solution Take 10 g by mouth every other day. Reported on 01/30/2016  . lamoTRIgine (LAMICTAL) 25 MG tablet  TAKE 1 TABLET (25 MG TOTAL) BY MOUTH DAILY.  Marland Kitchen lovastatin (MEVACOR) 40 MG tablet TAKE ONE TABLET BY MOUTH AT BEDTIME.  . metFORMIN (GLUCOPHAGE) 500 MG tablet Take 1 tablet (500 mg total) by mouth daily with breakfast.  . Metoprolol Tartrate 37.5 MG TABS TAKE 1 TABLET BY MOUTH TWICE A DAY [facility] AND SUPPER)  . polyethylene glycol powder (GLYCOLAX/MIRALAX) powder Take 17 g by mouth 2 (two) times daily as needed for moderate constipation.   No current facility-administered medications on file prior to visit.     Review of Systems Per HPI unless specifically indicated in ROS section     Objective:    BP 122/64   Pulse 63   Temp 98.1 F (36.7 C) (Oral)   Resp 16   Ht 5\' 2"  (1.575 m)   Wt 199 lb (90.3 kg)   SpO2 94%   BMI 36.40 kg/m   Wt Readings from Last 3 Encounters:  09/10/16 199 lb (90.3 kg)  06/04/16 195 lb 12 oz (88.8 kg)  04/02/16 199 lb 12 oz (90.6 kg)    Physical Exam  Constitutional: She appears well-developed and well-nourished. No distress.  HENT:  Head: Normocephalic and atraumatic.  Mouth/Throat: Oropharynx is clear and moist. No oropharyngeal exudate.  Cardiovascular: Normal rate, regular rhythm, normal heart sounds and intact distal pulses.   No murmur heard. Pulmonary/Chest: Effort normal  and breath sounds normal. No respiratory distress. She has no wheezes. She has no rales.  Musculoskeletal: She exhibits no edema.  Skin: Skin is warm and dry. No rash noted.  Psychiatric: She has a normal mood and affect.  Nursing note and vitals reviewed.  Results for orders placed or performed in visit on 09/03/16  Renal function panel  Result Value Ref Range   Sodium 140 135 - 145 mEq/L   Potassium 4.9 3.5 - 5.1 mEq/L   Chloride 103 96 - 112 mEq/L   CO2 29 19 - 32 mEq/L   Calcium 9.7 8.4 - 10.5 mg/dL   Albumin 4.3 3.5 - 5.2 g/dL   BUN 29 (H) 6 - 23 mg/dL   Creatinine, Ser 1.45 (H) 0.40 - 1.20 mg/dL   Glucose, Bld 154 (H) 70 - 99 mg/dL   Phosphorus 4.0 2.3  - 4.6 mg/dL   GFR 38.37 (L) >60.00 mL/min  Hemoglobin A1c  Result Value Ref Range   Hgb A1c MFr Bld 6.3 4.6 - 6.5 %  TSH  Result Value Ref Range   TSH 4.57 (H) 0.35 - 4.50 uIU/mL      Assessment & Plan:   Problem List Items Addressed This Visit    Abnormal TSH    TSH mildly elevated - rpt at next labs.       Bipolar I disorder, most recent episode (or current) manic (HCC)    Chronic, stable. Reviewed recent med changes per patient, asked to verify our med list is consistent with med regimen at home. Followed by ACT team (NP/counselor) that comes out to house monthly.       CKD (chronic kidney disease) stage 3, GFR 30-59 ml/min    Progressive since 2013.  Reviewed with patient, discussed importance of good hydration status.  Consider updated renal US.       Controlled type 2 diabetes mellitus with diabetic nephropathy (HCC) - Primary    Chronic, stable. Controlled based on recent A1c as well as log she brings. Continue metformin, caution with CKD.       HTN (hypertension), benign    Chronic, stable. Continue current regimen.           Follow up plan: Return in about 4 months (around 01/08/2017) for follow up visit.  Ria Bush, MD

## 2016-09-10 NOTE — Patient Instructions (Addendum)
Check at home to see if you're still taking haldol and lamictal (highlighted medicines today)  Sugars are doing well. Continue metformin once daily.  You do have chronic kidney disease - make sure to increase water by one to two 8 ounce glasses a day Return in 4 months for follow up visit.

## 2016-09-10 NOTE — Assessment & Plan Note (Signed)
Chronic, stable. Reviewed recent med changes per patient, asked to verify our med list is consistent with med regimen at home. Followed by ACT team (NP/counselor) that comes out to house monthly.

## 2016-09-10 NOTE — Progress Notes (Signed)
Pre-visit discussion using our clinic review tool. No additional management support is needed unless otherwise documented below in the visit note.  

## 2016-09-10 NOTE — Assessment & Plan Note (Signed)
Chronic, stable. Continue current regimen. 

## 2016-09-15 ENCOUNTER — Other Ambulatory Visit: Payer: Self-pay | Admitting: Family Medicine

## 2016-09-22 ENCOUNTER — Other Ambulatory Visit: Payer: Self-pay | Admitting: Family Medicine

## 2016-09-24 ENCOUNTER — Other Ambulatory Visit: Payer: Self-pay | Admitting: Family Medicine

## 2016-10-08 ENCOUNTER — Other Ambulatory Visit: Payer: Self-pay

## 2016-10-08 MED ORDER — ACCU-CHEK SOFTCLIX LANCETS MISC: 3 refills | Status: DC

## 2016-10-08 NOTE — Telephone Encounter (Signed)
Pt request lancets sent to [facility]. Spoke with Museum/gallery conservator at [employer] and she said pt needs softclix. I advised pt done and she will ck with pharmacy later.

## 2016-10-09 NOTE — Telephone Encounter (Signed)
Pt left v/m to ck on lancets; pt was told at [place] no lancets were sent in. I called [facility] and spoke with [facility] and lancets are ready for pick up. I called pt and she was unavailable and per DPR I told Devoria Glassing that lancet rx is ready for pick up. Mrs Darel Hong voiced understanding and will let pt know.

## 2016-10-23 ENCOUNTER — Other Ambulatory Visit: Payer: Self-pay | Admitting: Family Medicine

## 2016-11-14 ENCOUNTER — Telehealth: Payer: Self-pay | Admitting: *Deleted

## 2016-11-14 NOTE — Telephone Encounter (Signed)
Signed and in [name]'s box. 

## 2016-11-14 NOTE — Telephone Encounter (Signed)
Sleep study supply form in your IN box for completion.

## 2016-11-17 NOTE — Telephone Encounter (Signed)
Paperwork faxed °

## 2016-11-19 ENCOUNTER — Other Ambulatory Visit: Payer: Self-pay | Admitting: Family Medicine

## 2016-11-21 DIAGNOSIS — E119 Type 2 diabetes mellitus without complications: Secondary | ICD-10-CM | POA: Diagnosis not present

## 2016-11-21 DIAGNOSIS — H25813 Combined forms of age-related cataract, bilateral: Secondary | ICD-10-CM | POA: Diagnosis not present

## 2016-11-21 DIAGNOSIS — H524 Presbyopia: Secondary | ICD-10-CM | POA: Diagnosis not present

## 2016-11-21 LAB — HM DIABETES EYE EXAM

## 2016-11-24 ENCOUNTER — Ambulatory Visit (INDEPENDENT_AMBULATORY_CARE_PROVIDER_SITE_OTHER): Payer: Medicare Other | Admitting: Family Medicine

## 2016-11-24 ENCOUNTER — Encounter: Payer: Self-pay | Admitting: Family Medicine

## 2016-11-24 VITALS — BP 124/68 | HR 58 | Temp 98.2°F | Wt 200.5 lb

## 2016-11-24 DIAGNOSIS — M10071 Idiopathic gout, right ankle and foot: Secondary | ICD-10-CM

## 2016-11-24 MED ORDER — COLCHICINE 0.6 MG PO TABS: ORAL_TABLET | ORAL | 1 refills | Status: DC

## 2016-11-24 NOTE — Patient Instructions (Signed)
I think you have gout flare called podagra. Treat with colchicine 2 tablets today then 1 tablet daily for 10 days then one tablet daily as needed for gout pain. Let us know if not improving with treatment.    Gout Gout is painful swelling that can happen in some of your joints. Gout is a type of arthritis. This condition is caused by having too much uric acid in your body. Uric acid is a chemical that is made when your body breaks down substances called purines. If your body has too much uric acid, sharp crystals can form and build up in your joints. This causes pain and swelling. Gout attacks can happen quickly and be very painful (acute gout). Over time, the attacks can affect more joints and happen more often (chronic gout). Follow these instructions at home: During a Gout Attack   If directed, put ice on the painful area:  Put ice in a plastic bag.  Place a towel between your skin and the bag.  Leave the ice on for 20 minutes, 2-3 times a day.  Rest the joint as much as possible. If the joint is in your leg, you may be given crutches to use.  Raise (elevate) the painful joint above the level of your heart as often as you can.  Drink enough fluids to keep your pee (urine) clear or pale yellow.  Take over-the-counter and prescription medicines only as told by your doctor.  Do not drive or use heavy machinery while taking prescription pain medicine.  Follow instructions from your doctor about what you can or cannot eat and drink.  Return to your normal activities as told by your doctor. Ask your doctor what activities are safe for you. Avoiding Future Gout Attacks   Follow a low-purine diet as told by a specialist (dietitian) or your doctor. Avoid foods and drinks that have a lot of purines, such as:  Liver.  Kidney.  Anchovies.  Asparagus.  Herring.  Mushrooms  Mussels.  Beer.  Limit alcohol intake to no more than 1 drink a day for nonpregnant women and 2 drinks  a day for men. One drink equals 12 oz of beer, 5 oz of wine, or 1 oz of hard liquor.  Stay at a healthy weight or lose weight if you are overweight. If you want to lose weight, talk with your doctor. It is important that you do not lose weight too fast.  Start or continue an exercise plan as told by your doctor.  Drink enough fluids to keep your pee clear or pale yellow.  Take over-the-counter and prescription medicines only as told by your doctor.  Keep all follow-up visits as told by your doctor. This is important. Contact a doctor if:  You have another gout attack.  You still have symptoms of a gout attack after10 days of treatment.  You have problems (side effects) because of your medicines.  You have chills or a fever.  You have burning pain when you pee (urinate).  You have pain in your lower back or belly. Get help right away if:  You have very bad pain.  Your pain cannot be controlled.  You cannot pee. This information is not intended to replace advice given to you by your health care provider. Make sure you discuss any questions you have with your health care provider. Document Released: 04/29/2008 Document Revised: 12/27/2015 Document Reviewed: 05/03/2015 Elsevier Interactive Patient Education  2017 [employer].

## 2016-11-24 NOTE — Assessment & Plan Note (Signed)
Acute gout flare - treat today with colchicine 1.2mg  x1 then 0.6mg  daily x 10 days then PRN gout pain.  Patient off allopurinol. Avoid steroids given DM history. Caution with colchicine and CKD.

## 2016-11-24 NOTE — Progress Notes (Signed)
BP 124/68   Pulse (!) 58   Temp 98.2 F (36.8 C) (Oral)   Wt 200 lb 8 oz (90.9 kg)   BMI 36.67 kg/m    CC: R foot pain Subjective:    Patient ID: Gina [name], female    DOB: 01/12/1951, 66 y.o.   MRN: [id number]  HPI: Gina [name] is a 66 y.o. female presenting on 11/24/2016 for Foot Pain (right ? gout)   1d h/o R foot pain at 1st MTP with redness and swelling.  Denies inciting trauma/injury. No fevers. Has taken tylenol as well.  No diet changes.  Endorses h/o gout flare once a year.  Known CKD, known DM.   Sugars fasting 135-140.  Lab Results  Component Value Date   HGBA1C 6.3 09/03/2016     Relevant past medical, surgical, family and social history reviewed and updated as indicated. Interim medical history since our last visit reviewed. Allergies and medications reviewed and updated. Outpatient Medications Prior to Visit  Medication Sig Dispense Refill  . ACCU-CHEK SOFTCLIX LANCETS lancets Check blood sugar before eating breakfast and as needed. Dx E11.21 100 each 3  . Cholecalciferol (VITAMIN D) 2000 units CAPS Take 1 capsule by mouth daily.    . divalproex (DEPAKOTE) 500 MG DR tablet Take 1 tablet (500 mg total) by mouth 2 (two) times daily with a meal. 60 tablet 3  . docusate sodium (COLACE) 100 MG capsule TAKE TWO CAPSULES BY MOUTH TWO TIMES A DAY 180 capsule 1  . glucose blood (ACCU-CHEK AVIVA PLUS) test strip Use to check sugar before eating breakfast and as needed. Dx: E11.21 100 each 3  . lactulose (CHRONULAC) 10 GM/15ML solution Take 10 g by mouth every other day. Reported on 01/30/2016    . lovastatin (MEVACOR) 40 MG tablet TAKE ONE TABLET BY MOUTH AT BEDTIME. 28 tablet 6  . metFORMIN (GLUCOPHAGE) 500 MG tablet TAKE 1 TABLET IN THE MORNING WITH FOOD 28 tablet 6  . metoprolol tartrate (LOPRESSOR) 25 MG tablet TAKE 1 & 1/2 TABLET BY MOUTH TWICE DAILY WITH FOOD 84 tablet 3  . Metoprolol Tartrate 37.5 MG TABS TAKE 1 TABLET BY MOUTH TWICE A DAY  [facility] AND SUPPER) 60 tablet 3  . polyethylene glycol powder (GLYCOLAX/MIRALAX) powder TAKE 17 GRAMS BY MOUTH TWICE DAILY AS NEEDED FOR MODERATE CONSTIPATION. 527 g 3  . risperiDONE (RISPERDAL) 1 MG tablet Take 1 mg by mouth 2 (two) times daily.    . diphenhydrAMINE (BENADRYL) 25 mg capsule Take 1 capsule (25 mg total) by mouth daily with supper. (Patient not taking: Reported on 11/24/2016) 30 capsule 3  . haloperidol (HALDOL) 5 MG tablet Take 1 tablet (5 mg total) by mouth daily with supper. (Patient not taking: Reported on 11/24/2016) 30 tablet 3  . lamoTRIgine (LAMICTAL) 25 MG tablet TAKE 1 TABLET (25 MG TOTAL) BY MOUTH DAILY. (Patient not taking: Reported on 11/24/2016) 30 tablet 0   No facility-administered medications prior to visit.      Per HPI unless specifically indicated in ROS section below Review of Systems     Objective:    BP 124/68   Pulse (!) 58   Temp 98.2 F (36.8 C) (Oral)   Wt 200 lb 8 oz (90.9 kg)   BMI 36.67 kg/m   Wt Readings from Last 3 Encounters:  11/24/16 200 lb 8 oz (90.9 kg)  09/10/16 199 lb (90.3 kg)  06/04/16 195 lb 12 oz (88.8 kg)    Physical Exam  Constitutional:  She appears well-developed and well-nourished. No distress.  Musculoskeletal: She exhibits edema.  R great toe pain at 1st MTPJ with swelling, warmth of joint.  No pain with axial loading 2+ DP bilaterally.   Skin: Skin is warm and dry. No rash noted. There is erythema.  Nursing note and vitals reviewed.      Assessment & Plan:   Problem List Items Addressed This Visit    Gout - Primary    Acute gout flare - treat today with colchicine 1.2mg  x1 then 0.6mg  daily x 10 days then PRN gout pain.  Patient off allopurinol. Avoid steroids given DM history. Caution with colchicine and CKD.          Follow up plan: Return if symptoms worsen or fail to improve.  Ria Bush, MD

## 2016-11-24 NOTE — Progress Notes (Signed)
Pre visit review using our clinic review tool, if applicable. No additional management support is needed unless otherwise documented below in the visit note. 

## 2016-12-10 DIAGNOSIS — Z79899 Other long term (current) drug therapy: Secondary | ICD-10-CM | POA: Diagnosis not present

## 2016-12-10 LAB — HEPATIC FUNCTION PANEL
ALT: 23 U/L (ref 7–35)
AST: 19 U/L (ref 13–35)
Alkaline Phosphatase: 83 U/L (ref 25–125)
Bilirubin, Total: 0.3 mg/dL

## 2016-12-10 LAB — BASIC METABOLIC PANEL
CREATININE: 1 mg/dL (ref <=1.1)
GLUCOSE: 135 mg/dL
POTASSIUM: 5.1 mmol/L (ref 3.4–5.3)

## 2016-12-10 LAB — CBC AND DIFFERENTIAL
HEMOGLOBIN: 11.5 g/dL — AB (ref 12.0–16.0)
PLATELETS: 116 10*3/uL — AB (ref 150–399)
WBC: 6.7 10^3/mL

## 2016-12-12 ENCOUNTER — Encounter: Payer: Self-pay | Admitting: Family Medicine

## 2016-12-15 ENCOUNTER — Other Ambulatory Visit: Payer: Self-pay | Admitting: Family Medicine

## 2016-12-15 DIAGNOSIS — Z1231 Encounter for screening mammogram for malignant neoplasm of breast: Secondary | ICD-10-CM

## 2016-12-20 ENCOUNTER — Encounter: Payer: Self-pay | Admitting: Family Medicine

## 2016-12-31 ENCOUNTER — Encounter (INDEPENDENT_AMBULATORY_CARE_PROVIDER_SITE_OTHER): Payer: Self-pay

## 2016-12-31 ENCOUNTER — Ambulatory Visit (INDEPENDENT_AMBULATORY_CARE_PROVIDER_SITE_OTHER): Payer: Medicare Other | Admitting: Family Medicine

## 2016-12-31 ENCOUNTER — Encounter: Payer: Self-pay | Admitting: Family Medicine

## 2016-12-31 VITALS — BP 128/70 | HR 60 | Temp 97.9°F | Wt 204.0 lb

## 2016-12-31 DIAGNOSIS — E1121 Type 2 diabetes mellitus with diabetic nephropathy: Secondary | ICD-10-CM | POA: Diagnosis not present

## 2016-12-31 DIAGNOSIS — R946 Abnormal results of thyroid function studies: Secondary | ICD-10-CM

## 2016-12-31 DIAGNOSIS — N183 Chronic kidney disease, stage 3 unspecified: Secondary | ICD-10-CM

## 2016-12-31 DIAGNOSIS — D696 Thrombocytopenia, unspecified: Secondary | ICD-10-CM

## 2016-12-31 DIAGNOSIS — F3110 Bipolar disorder, current episode manic without psychotic features, unspecified: Secondary | ICD-10-CM | POA: Diagnosis not present

## 2016-12-31 DIAGNOSIS — I10 Essential (primary) hypertension: Secondary | ICD-10-CM | POA: Diagnosis not present

## 2016-12-31 DIAGNOSIS — E785 Hyperlipidemia, unspecified: Secondary | ICD-10-CM | POA: Diagnosis not present

## 2016-12-31 DIAGNOSIS — E669 Obesity, unspecified: Secondary | ICD-10-CM | POA: Diagnosis not present

## 2016-12-31 DIAGNOSIS — R7989 Other specified abnormal findings of blood chemistry: Secondary | ICD-10-CM

## 2016-12-31 DIAGNOSIS — IMO0001 Reserved for inherently not codable concepts without codable children: Secondary | ICD-10-CM

## 2016-12-31 LAB — HEMOGLOBIN A1C: Hgb A1c MFr Bld: 7.7 % — ABNORMAL HIGH (ref 4.6–6.5)

## 2016-12-31 LAB — TSH: TSH: 8.29 u[IU]/mL — ABNORMAL HIGH (ref 0.35–4.50)

## 2016-12-31 LAB — LDL CHOLESTEROL, DIRECT: LDL DIRECT: 96 mg/dL

## 2016-12-31 LAB — T4, FREE: Free T4: 0.81 ng/dL (ref 0.60–1.60)

## 2016-12-31 NOTE — Progress Notes (Signed)
BP 128/70   Pulse 60   Temp 97.9 F (36.6 C) (Oral)   Wt 204 lb (92.5 kg)   SpO2 98%   BMI 37.31 kg/m    CC: 1 mo f/u visit Subjective:    Patient ID: Gina [name], female    DOB: May 29, 1951, 66 y.o.   MRN: [id number]  HPI: ALONIE GAZZOLA is a 66 y.o. female presenting on 12/31/2016 for Follow-up   Bipolar - sees psychiatrist and counselor with ACT team at home.   DM - regularly does check sugars fasting - 110-140s. Compliant with antihyperglycemic regimen which includes: metformin 500mg  once daily. Denies low sugars or hypoglycemic symptoms.  Denies paresthesias. Last diabetic eye exam 11/2016. Pneumovax: DUE. Prevnar: 03/2016. Lab Results  Component Value Date   HGBA1C 6.3 09/03/2016   Diabetic Foot Exam - Simple   Simple Foot Form Diabetic Foot exam was performed with the following findings:  Yes 12/31/2016  8:15 AM  Visual Inspection No deformities, no ulcerations, no other skin breakdown bilaterally:  Yes Sensation Testing See comments:  Yes Pulse Check Posterior Tibialis and Dorsalis pulse intact bilaterally:  Yes Comments Diminished to monofilament bilaterally      Thromboyctopenia - chronic.   Relevant past medical, surgical, family and social history reviewed and updated as indicated. Interim medical history since our last visit reviewed. Allergies and medications reviewed and updated. Outpatient Medications Prior to Visit  Medication Sig Dispense Refill  . ACCU-CHEK SOFTCLIX LANCETS lancets Check blood sugar before eating breakfast and as needed. Dx E11.21 100 each 3  . Cholecalciferol (VITAMIN D) 2000 units CAPS Take 1 capsule by mouth daily.    . divalproex (DEPAKOTE) 500 MG DR tablet Take 1 tablet (500 mg total) by mouth 2 (two) times daily with a meal. 60 tablet 3  . docusate sodium (COLACE) 100 MG capsule TAKE TWO CAPSULES BY MOUTH TWO TIMES A DAY 180 capsule 1  . glucose blood (ACCU-CHEK AVIVA PLUS) test strip Use to check sugar before  eating breakfast and as needed. Dx: E11.21 100 each 3  . lovastatin (MEVACOR) 40 MG tablet TAKE ONE TABLET BY MOUTH AT BEDTIME. 28 tablet 6  . metFORMIN (GLUCOPHAGE) 500 MG tablet TAKE 1 TABLET IN THE MORNING WITH FOOD 28 tablet 6  . metoprolol tartrate (LOPRESSOR) 25 MG tablet TAKE 1 & 1/2 TABLET BY MOUTH TWICE DAILY WITH FOOD 84 tablet 3  . polyethylene glycol powder (GLYCOLAX/MIRALAX) powder TAKE 17 GRAMS BY MOUTH TWICE DAILY AS NEEDED FOR MODERATE CONSTIPATION. 527 g 3  . risperiDONE (RISPERDAL) 1 MG tablet Take 1 mg by mouth 2 (two) times daily.    . colchicine 0.6 MG tablet Take 2 on first day, then one tablet daily for 10 days then as needed for gout pain 30 tablet 1  . lactulose (CHRONULAC) 10 GM/15ML solution Take 10 g by mouth every other day. Reported on 01/30/2016    . Metoprolol Tartrate 37.5 MG TABS TAKE 1 TABLET BY MOUTH TWICE A DAY [facility] AND SUPPER) 60 tablet 3   No facility-administered medications prior to visit.      Per HPI unless specifically indicated in ROS section below Review of Systems     Objective:    BP 128/70   Pulse 60   Temp 97.9 F (36.6 C) (Oral)   Wt 204 lb (92.5 kg)   SpO2 98%   BMI 37.31 kg/m   Wt Readings from Last 3 Encounters:  12/31/16 204 lb (92.5 kg)  11/24/16 200  lb 8 oz (90.9 kg)  09/10/16 199 lb (90.3 kg)    Physical Exam  Constitutional: She appears well-developed and well-nourished. No distress.  HENT:  Head: Normocephalic and atraumatic.  Mouth/Throat: Oropharynx is clear and moist. No oropharyngeal exudate.  Eyes: Conjunctivae and EOM are normal. Pupils are equal, round, and reactive to light. No scleral icterus.  Neck: Normal range of motion. Neck supple.  Cardiovascular: Normal rate, regular rhythm, normal heart sounds and intact distal pulses.   No murmur heard. Pulmonary/Chest: Effort normal and breath sounds normal. No respiratory distress. She has no wheezes. She has no rales.  Musculoskeletal: She exhibits no  edema.  See HPI for foot exam if done  Lymphadenopathy:    She has no cervical adenopathy.  Skin: Skin is warm and dry. No rash noted.  Psychiatric: She has a normal mood and affect.  Nursing note and vitals reviewed.  Results for orders placed or performed in visit on 12/20/16  CBC and differential  Result Value Ref Range   Hemoglobin 11.5 (A) 12.0 - 16.0 g/dL   Platelets 116 (A) 150 - 399 K/L   WBC 6.7 62^8/BT  Basic metabolic panel  Result Value Ref Range   Glucose 135 mg/dL   Creatinine 1.0 0.5 - 1.1 mg/dL   Potassium 5.1 3.4 - 5.3 mmol/L  Hepatic function panel  Result Value Ref Range   Alkaline Phosphatase 83 25 - 125 U/L   ALT 23 7 - 35 U/L   AST 19 13 - 35 U/L   Bilirubin, Total 0.3 mg/dL      Assessment & Plan:   Problem List Items Addressed This Visit    Abnormal TSH    Update TSH, free T4.      Relevant Orders   T4, free   TSH   Bipolar I disorder, most recent episode (or current) manic ([place])    Appreciate psych care.       CKD (chronic kidney disease) stage 3, GFR 30-59 ml/min    Cr actually improved on latest check by psych. Continue to monitor.       Controlled type 2 diabetes mellitus with diabetic nephropathy (HCC) - Primary    Chronic, stable. Update A1c. Foot exam today. Discussed diabetic diet. Some signs of diabetic neuropathy today.       Relevant Orders   Hemoglobin A1c   HTN (hypertension), benign    Chronic, stable. Continue current regimen.       Hyperlipidemia    Update d LDL on lovastatin.       Relevant Orders   LDL cholesterol, direct   Obesity, Class II, BMI 35-39.9, with comorbidity ([place])    Discussed healthy diet changes to affect sustainable weight loss.       Thrombocytopenia (HCC)    Chronic, ?depakote related.  Update periph smear today.  If persistent dacrocytosis, consider heme referral.       Relevant Orders   Pathologist smear review       Follow up plan: Return in about 3 months (around 04/02/2017)  for follow up visit.  Ria Bush, MD

## 2016-12-31 NOTE — Assessment & Plan Note (Signed)
Chronic, ?depakote related.  Update periph smear today.  If persistent dacrocytosis, consider heme referral.

## 2016-12-31 NOTE — Assessment & Plan Note (Addendum)
Update TSH, free T4.  

## 2016-12-31 NOTE — Assessment & Plan Note (Signed)
Update d LDL on lovastatin.

## 2016-12-31 NOTE — Assessment & Plan Note (Addendum)
Chronic, stable. Update A1c. Foot exam today. Discussed diabetic diet. Some signs of diabetic neuropathy today.

## 2016-12-31 NOTE — Assessment & Plan Note (Signed)
Discussed healthy diet changes to affect sustainable weight loss. 

## 2016-12-31 NOTE — Patient Instructions (Addendum)
Labs today.  Watch sugar in diet, work on low carb diabetic diet.  You are doing well today.  Return as needed or in 3 months for follow up visit.

## 2016-12-31 NOTE — Assessment & Plan Note (Signed)
Appreciate psych care.  ?

## 2016-12-31 NOTE — Assessment & Plan Note (Signed)
Cr actually improved on latest check by psych. Continue to monitor.

## 2016-12-31 NOTE — Assessment & Plan Note (Signed)
Chronic, stable. Continue current regimen. 

## 2017-01-01 LAB — PATHOLOGIST SMEAR REVIEW

## 2017-01-03 ENCOUNTER — Other Ambulatory Visit: Payer: Self-pay | Admitting: Family Medicine

## 2017-01-03 MED ORDER — FERROUS SULFATE 325 (65 FE) MG PO TABS: 325.0000 mg | ORAL_TABLET | Freq: Every day | ORAL | 3 refills | Status: DC

## 2017-01-03 MED ORDER — METFORMIN HCL 500 MG PO TABS: 500.0000 mg | ORAL_TABLET | Freq: Two times a day (BID) | ORAL | 6 refills | Status: DC

## 2017-01-16 ENCOUNTER — Encounter: Payer: Self-pay | Admitting: Genetics

## 2017-01-16 NOTE — Progress Notes (Signed)
  Amendment: The date of this amended report is January 14, 2017. Based on the ACMG standards and guidelines for the interpretation of sequence variants (Richards 2015) which utilize a combination of sources, e.g., internal data, published literature, population databases and in silico models, the MUTYH, c.1276C>T (p.Arg426Cys) VUS has been reclassified to Likely  Benign.  The classification for the PMS2 c.983A>G (p.Asp328Gly) variant has not been changed.

## 2017-01-26 ENCOUNTER — Ambulatory Visit
Admission: RE | Admit: 2017-01-26 | Discharge: 2017-01-26 | Disposition: A | Discharge status: Home / Self Care | Payer: Medicare Other | Source: Ambulatory Visit | Attending: Family Medicine | Admitting: Family Medicine

## 2017-01-26 DIAGNOSIS — Z1231 Encounter for screening mammogram for malignant neoplasm of breast: Secondary | ICD-10-CM | POA: Diagnosis not present

## 2017-02-06 ENCOUNTER — Other Ambulatory Visit: Payer: Self-pay | Admitting: Family Medicine

## 2017-02-07 IMAGING — CT CT HEAD W/O CM
2 series · 14 of 30 positions shown, 16 images · non-contrast
Comparison: 04/08/2013 CT.

CLINICAL DATA: 63-year-old female tripped while walking dog hitting
the bridge of nose. Initial encounter.

EXAM:
CT HEAD WITHOUT CONTRAST
TECHNIQUE: Contiguous axial images were obtained from the base of the skull
through the vertex without intravenous contrast.

[Series 2: head wo · axial · 0.44mm/px · z∈[-141,-28]mm · 6 of 36 slices shown, 8 images]
[im 6/36  brain]
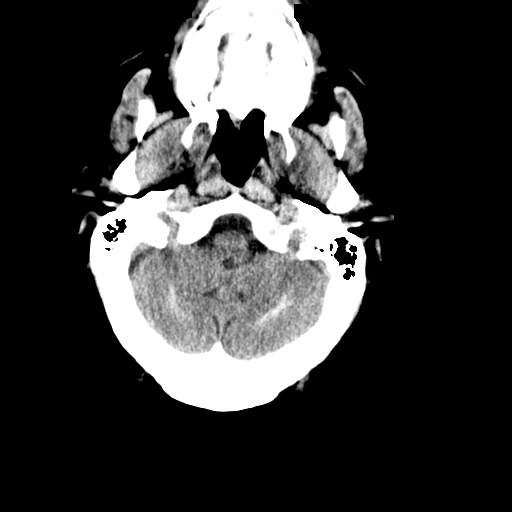
[im 6/36  bone]
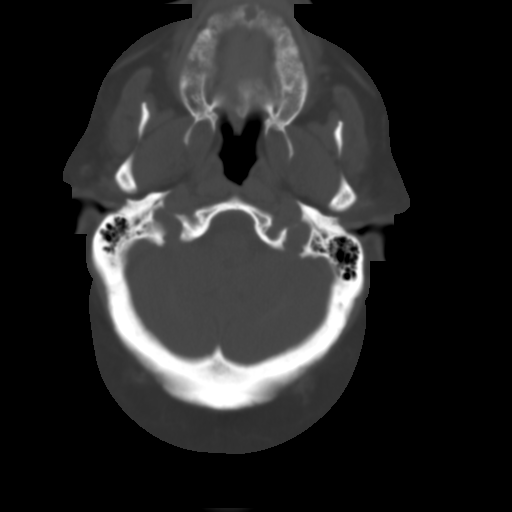
[im 11/36  brain]
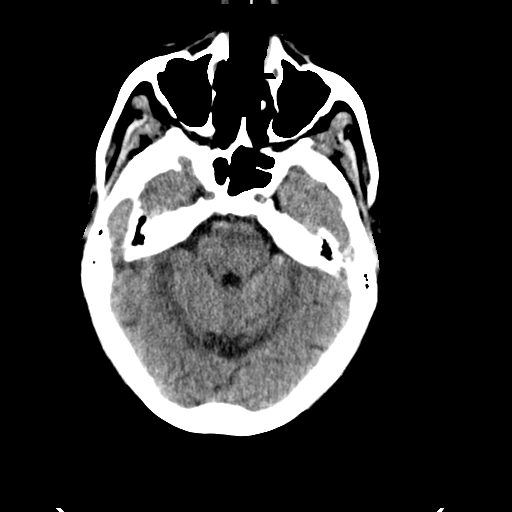
[im 16/36  brain]
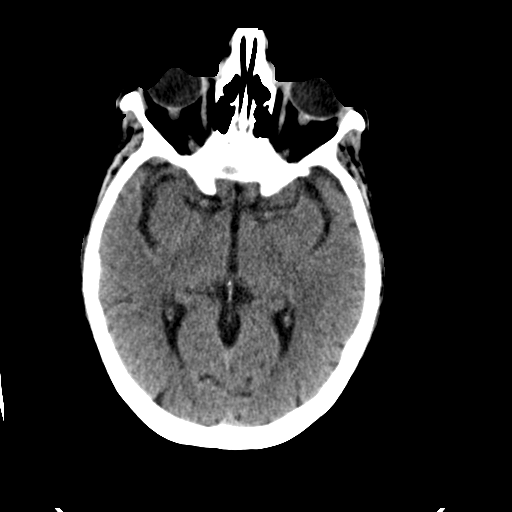
[im 21/36  brain]
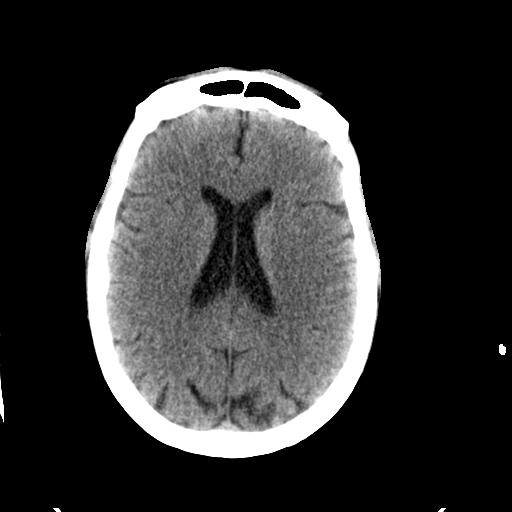
[im 26/36  brain]
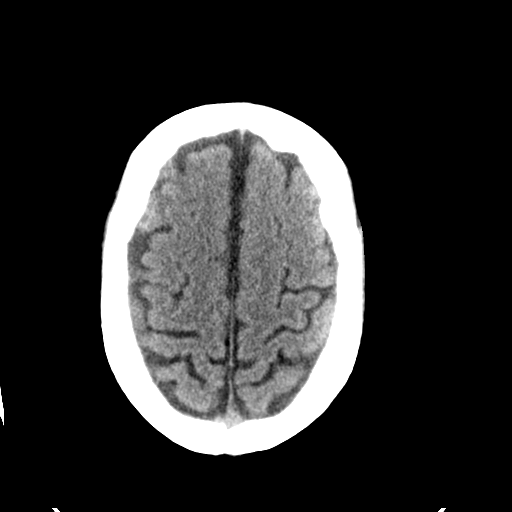
[im 26/36  bone]
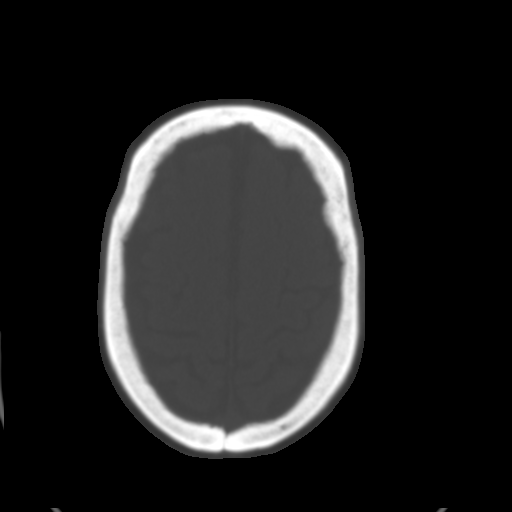
[im 31/36  brain]
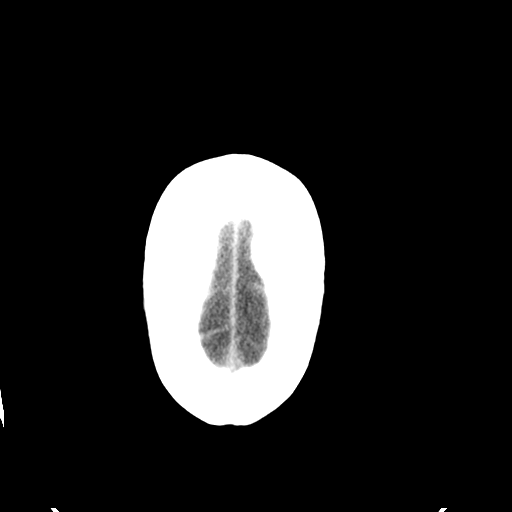

[Series 3: head bone · axial · 0.44mm/px · z∈[-150,-21]mm · 8 of 108 slices shown]
[im 11/108  bone]
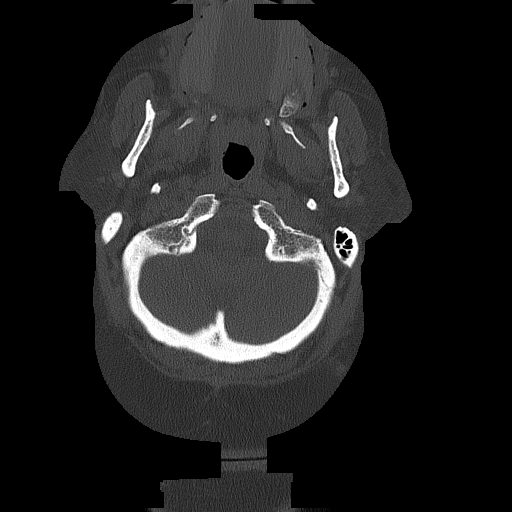
[im 21/108  bone]
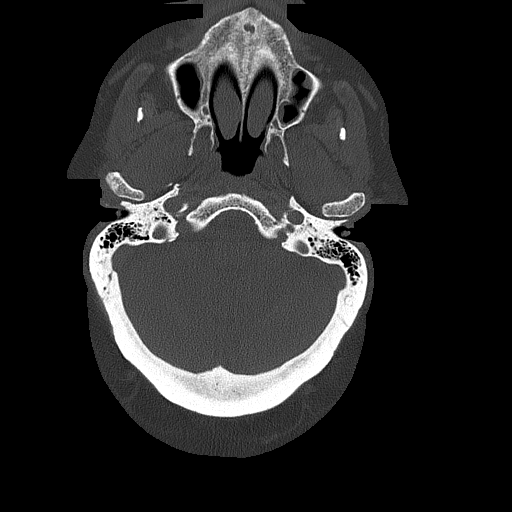
[im 36/108  bone]
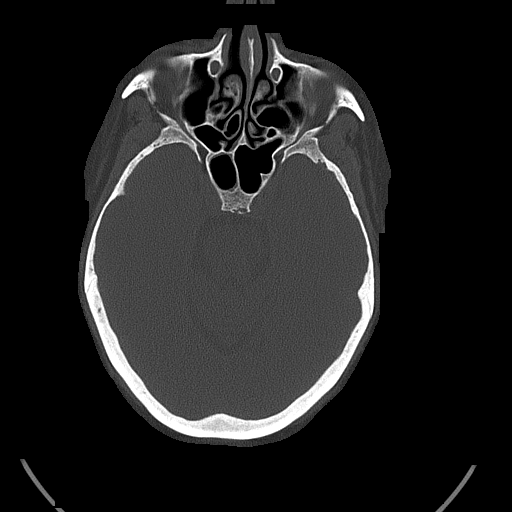
[im 46/108  bone]
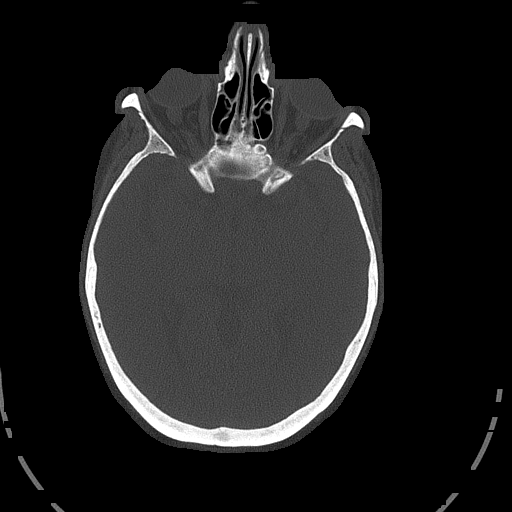
[im 62/108  bone]
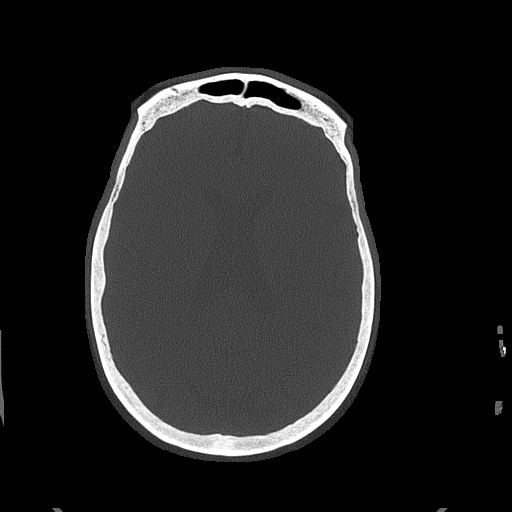
[im 72/108  bone]
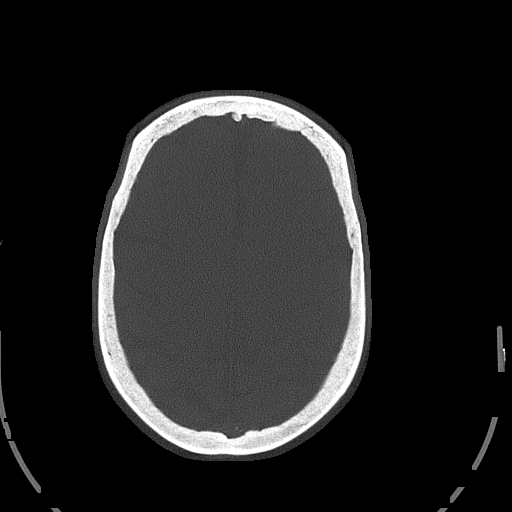
[im 87/108  bone]
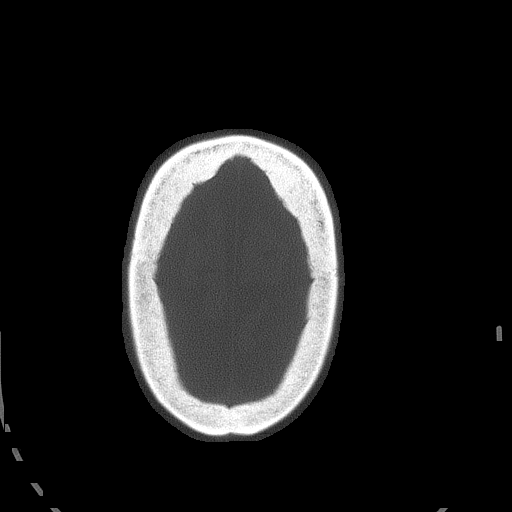
[im 97/108  bone]
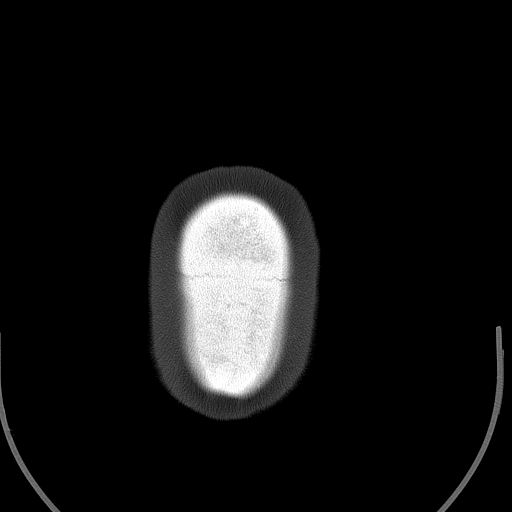

[14 of 30 positions shown; findings below may reference images not displayed]

FINDINGS: No skull fracture or intracranial hemorrhage. No displaced nasal
bone fracture identified.

Mild exophthalmos otherwise orbital structures appear intact.

Calcification dentate nucleus unchanged. Mineral deposition globus
pallidus stable.

Mild atrophy without hydrocephalus.

No CT evidence of large acute infarct.

No intracranial mass lesion noted on this unenhanced exam.
IMPRESSION: No skull fracture or intracranial hemorrhage. No displaced nasal
bone fracture identified.

## 2017-02-24 ENCOUNTER — Encounter: Payer: Self-pay | Admitting: Internal Medicine

## 2017-02-26 ENCOUNTER — Encounter: Payer: Self-pay | Admitting: Family Medicine

## 2017-02-26 ENCOUNTER — Ambulatory Visit (INDEPENDENT_AMBULATORY_CARE_PROVIDER_SITE_OTHER): Payer: Medicare Other | Admitting: Family Medicine

## 2017-02-26 VITALS — BP 126/66 | HR 69 | Temp 97.9°F | Wt 197.5 lb

## 2017-02-26 DIAGNOSIS — Z86010 Personal history of colonic polyps: Secondary | ICD-10-CM

## 2017-02-26 DIAGNOSIS — E1121 Type 2 diabetes mellitus with diabetic nephropathy: Secondary | ICD-10-CM

## 2017-02-26 DIAGNOSIS — R350 Frequency of micturition: Secondary | ICD-10-CM | POA: Diagnosis not present

## 2017-02-26 DIAGNOSIS — M545 Low back pain, unspecified: Secondary | ICD-10-CM | POA: Insufficient documentation

## 2017-02-26 DIAGNOSIS — M79605 Pain in left leg: Principal | ICD-10-CM

## 2017-02-26 LAB — POC URINALSYSI DIPSTICK (AUTOMATED)
BILIRUBIN UA: NEGATIVE
GLUCOSE UA: NEGATIVE
Ketones, UA: NEGATIVE
LEUKOCYTES UA: NEGATIVE
NITRITE UA: NEGATIVE
PH UA: 6 (ref 5.0–8.0)
Protein, UA: NEGATIVE
RBC UA: NEGATIVE
Spec Grav, UA: 1.015 (ref 1.010–1.025)
Urobilinogen, UA: 0.2 E.U./dL

## 2017-02-26 MED ORDER — METHOCARBAMOL 500 MG PO TABS: 500.0000 mg | ORAL_TABLET | Freq: Two times a day (BID) | ORAL | 0 refills | Status: DC | PRN

## 2017-02-26 MED ORDER — ACETAMINOPHEN 500 MG PO TABS: 500.0000 mg | ORAL_TABLET | Freq: Three times a day (TID) | ORAL | Status: DC | PRN

## 2017-02-26 NOTE — Progress Notes (Signed)
BP 126/66   Pulse 69   Temp 97.9 F (36.6 C) (Oral)   Wt 197 lb 8 oz (89.6 kg)   SpO2 95%   BMI 36.12 kg/m    CC: back pain Subjective:    Patient ID: Gina [name], female    DOB: [birth year], 66 y.o.   MRN: [id number]  HPI: Gina [name] is a 66 y.o. female presenting on 02/26/2017 for Back Pain and Urinary Frequency   Left lower back over the past several weeks, worse when getting out of bed. Noticing increased urinary frequency. Denies inciting trauma/injury to lower back. Some radiation of pain down lateral left leg to knee. Denies numbness or weakness of leg. Denies fevers/chills, bowel/bladder incontinence.   Treating with aspirin 325mg  and heating pad which helps.   Brings log of fasting sugars - 100-130s She has been started on trazodone 100mg  QHS PRN sleep, started by Mickel Baas psych NP.  Due for colonoscopy.   Relevant past medical, surgical, family and social history reviewed and updated as indicated. Interim medical history since our last visit reviewed. Allergies and medications reviewed and updated. Outpatient Medications Prior to Visit  Medication Sig Dispense Refill  . ACCU-CHEK SOFTCLIX LANCETS lancets Check blood sugar before eating breakfast and as needed. Dx E11.21 100 each 3  . Cholecalciferol (VITAMIN D) 2000 units CAPS Take 1 capsule by mouth daily.    . divalproex (DEPAKOTE) 500 MG DR tablet Take 1 tablet (500 mg total) by mouth 2 (two) times daily with a meal. 60 tablet 3  . docusate sodium (COLACE) 100 MG capsule TAKE TWO CAPSULES BY MOUTH TWO TIMES A DAY 180 capsule 1  . ferrous sulfate 325 (65 FE) MG tablet Take 1 tablet (325 mg total) by mouth daily with breakfast.  3  . glucose blood (ACCU-CHEK AVIVA PLUS) test strip Use to check sugar before eating breakfast and as needed. Dx: E11.21 100 each 3  . lovastatin (MEVACOR) 40 MG tablet TAKE ONE TABLET BY MOUTH AT BEDTIME. 28 tablet 6  . metFORMIN (GLUCOPHAGE) 500 MG tablet Take 1 tablet (500  mg total) by mouth 2 (two) times daily with a meal. 60 tablet 6  . metoprolol tartrate (LOPRESSOR) 25 MG tablet TAKE 1 & 1/2 TABLET BY MOUTH TWICE DAILY WITH FOOD 84 tablet 6  . polyethylene glycol powder (GLYCOLAX/MIRALAX) powder TAKE 17 GRAMS BY MOUTH TWICE DAILY AS NEEDED FOR MODERATE CONSTIPATION. 527 g 3  . risperiDONE (RISPERDAL) 1 MG tablet Take 1 mg by mouth 2 (two) times daily.     No facility-administered medications prior to visit.      Per HPI unless specifically indicated in ROS section below Review of Systems     Objective:    BP 126/66   Pulse 69   Temp 97.9 F (36.6 C) (Oral)   Wt 197 lb 8 oz (89.6 kg)   SpO2 95%   BMI 36.12 kg/m   Wt Readings from Last 3 Encounters:  02/26/17 197 lb 8 oz (89.6 kg)  12/31/16 204 lb (92.5 kg)  11/24/16 200 lb 8 oz (90.9 kg)    Physical Exam  Constitutional: She is oriented to person, place, and time. She appears well-developed and well-nourished. No distress.  Musculoskeletal: She exhibits no edema.  Discomfort to palpation at mid to lower lumbar midline spine ++ paraspinous mm tenderness L>R Neg SLR bilaterally. No pain with int/ext rotation at hip. Discomfort with FABER on left.  Neurological: She is alert and oriented to person, place,  and time. She has normal strength. No sensory deficit. She displays a negative Romberg sign.  Reflex Scores:      Patellar reflexes are 2+ on the right side and 2+ on the left side. Stance intact 5/5 strength BLE Able to heel and toe walk Wide based gait at baseline  Skin: Skin is warm and dry. No rash noted.  Psychiatric: She has a normal mood and affect.  Nursing note and vitals reviewed.  Lab Results  Component Value Date   HGBA1C 7.7 (H) 12/31/2016       Assessment & Plan:   Problem List Items Addressed This Visit    Controlled type 2 diabetes mellitus with diabetic nephropathy ([place])    cbg log reviewed, good control noted. Will return for labs at next visit.       Low  back pain radiating to left lower extremity - Primary    Left lower back pain of several weeks duration with benign exam - anticipate lumbar strain. UA WNL. Supportive care reviewed. Treat with tylenol, robaxin, heating pad, and stretching exercises provided from [facility] pt advisor. Update if not improving with treatment.       Relevant Medications   acetaminophen (TYLENOL) 500 MG tablet   methocarbamol (ROBAXIN) 500 MG tablet   Personal history of colonic adenomas and colon cancer    Due for rpt colonoscopy. Pt requests referral in [place] area, would like to see MD who saw her sister. New referral placed.       Relevant Orders   Ambulatory referral to Gastroenterology    Other Visit Diagnoses    Urinary frequency       Relevant Orders   POCT Urinalysis Dipstick (Automated) (Completed)       Follow up plan: Return in about 3 months (around 05/29/2017), or if symptoms worsen or fail to improve.  Gina Bush, MD

## 2017-02-26 NOTE — Patient Instructions (Addendum)
We will refer you to local GI for repeat colonoscopy - see Rosaria Ferries on your way out to schedule this.  I think you have lumbar strain of lower lumbar stabilizing muscles.  Treat with tylenol 500mg  three times daily with meals and muscle relaxant sent to pharmacy for 1 week (caution it can make you sleepy). Continue heating pad to back, do exercises provided today.  Let us know if not improving with this.

## 2017-02-26 NOTE — Assessment & Plan Note (Signed)
cbg log reviewed, good control noted. Will return for labs at next visit.

## 2017-02-26 NOTE — Assessment & Plan Note (Addendum)
Due for rpt colonoscopy. Pt requests referral in [place] area, would like to see MD who saw her sister. New referral placed.

## 2017-02-26 NOTE — Assessment & Plan Note (Addendum)
Left lower back pain of several weeks duration with benign exam - anticipate lumbar strain. UA WNL. Supportive care reviewed. Treat with tylenol, robaxin, heating pad, and stretching exercises provided from [facility] pt advisor. Update if not improving with treatment.

## 2017-03-04 ENCOUNTER — Ambulatory Visit: Payer: Self-pay | Admitting: Family Medicine

## 2017-03-05 ENCOUNTER — Ambulatory Visit: Payer: Medicare Other | Admitting: Family Medicine

## 2017-03-09 DIAGNOSIS — Z23 Encounter for immunization: Secondary | ICD-10-CM | POA: Diagnosis not present

## 2017-03-16 ENCOUNTER — Other Ambulatory Visit: Payer: Self-pay

## 2017-03-16 ENCOUNTER — Telehealth: Payer: Self-pay

## 2017-03-16 DIAGNOSIS — Z86010 Personal history of colonic polyps: Secondary | ICD-10-CM

## 2017-03-16 NOTE — Telephone Encounter (Signed)
Gastroenterology Pre-Procedure Review  Request Date: 9/11 Requesting Physician: Dr. [name]  PATIENT REVIEW QUESTIONS: The patient responded to the following health history questions as indicated:    *No major illness except diabetes  1. Are you having any GI issues? no 2. Do you have a personal history of Polyps? yes (removed) 3. Do you have a family history of Colon Cancer or Polyps? yes (sister and mother: colon cancer) 4. Diabetes Mellitus? yes (Type II) 5. Joint replacements in the past 12 months?no 6. Major health problems in the past 3 months?no 7. Any artificial heart valves, MVP, or defibrillator?no    MEDICATIONS & ALLERGIES:    Patient reports the following regarding taking any anticoagulation/antiplatelet therapy:   Plavix, Coumadin, Eliquis, Xarelto, Lovenox, Pradaxa, Brilinta, or Effient? no Aspirin? no  Patient confirms/reports the following medications:  Current Outpatient Prescriptions  Medication Sig Dispense Refill  . ACCU-CHEK SOFTCLIX LANCETS lancets Check blood sugar before eating breakfast and as needed. Dx E11.21 100 each 3  . acetaminophen (TYLENOL) 500 MG tablet Take 1 tablet (500 mg total) by mouth 3 (three) times daily as needed.    . Cholecalciferol (VITAMIN D) 2000 units CAPS Take 1 capsule by mouth daily.    . divalproex (DEPAKOTE) 500 MG DR tablet Take 1 tablet (500 mg total) by mouth 2 (two) times daily with a meal. 60 tablet 3  . docusate sodium (COLACE) 100 MG capsule TAKE TWO CAPSULES BY MOUTH TWO TIMES A DAY 180 capsule 1  . ferrous sulfate 325 (65 FE) MG tablet Take 1 tablet (325 mg total) by mouth daily with breakfast.  3  . glucose blood (ACCU-CHEK AVIVA PLUS) test strip Use to check sugar before eating breakfast and as needed. Dx: E11.21 100 each 3  . lovastatin (MEVACOR) 40 MG tablet TAKE ONE TABLET BY MOUTH AT BEDTIME. 28 tablet 6  . metFORMIN (GLUCOPHAGE) 500 MG tablet Take 1 tablet (500 mg total) by mouth 2 (two) times daily with a meal. 60  tablet 6  . methocarbamol (ROBAXIN) 500 MG tablet Take 1 tablet (500 mg total) by mouth 2 (two) times daily as needed for muscle spasms. 30 tablet 0  . metoprolol tartrate (LOPRESSOR) 25 MG tablet TAKE 1 & 1/2 TABLET BY MOUTH TWICE DAILY WITH FOOD 84 tablet 6  . polyethylene glycol powder (GLYCOLAX/MIRALAX) powder TAKE 17 GRAMS BY MOUTH TWICE DAILY AS NEEDED FOR MODERATE CONSTIPATION. 527 g 3  . risperiDONE (RISPERDAL) 1 MG tablet Take 1 mg by mouth 2 (two) times daily.    . traZODone (DESYREL) 100 MG tablet Take 100 mg by mouth daily as needed.     No current facility-administered medications for this visit.     Patient confirms/reports the following allergies:  Allergies  Allergen Reactions  . Risperidone And Related Other (See Comments)    Reaction:  Altered mental status   . Penicillins Other (See Comments)    Pt states that this med knocks her out.  Has patient had a PCN reaction causing immediate rash, facial/tongue/throat swelling, SOB or lightheadedness with hypotension Unsure  Has patient had a PCN reaction causing severe rash involving mucus membranes or skin necrosis Unsure  Has patient had a PCN reaction that required hospitalization Unsure  Has patient had a PCN reaction occurring within the last 10 years Unsure  If all of the above answers are "NO", then may proceed with Cephalosporin use.  Clementeen Hoof [Iodinated Diagnostic Agents] Rash  . Tetanus Toxoids Rash       .  Zetia [Ezetimibe] Rash    No orders of the defined types were placed in this encounter.   AUTHORIZATION INFORMATION Primary Insurance: 1D#: Group #:  Secondary Insurance: 1D#: Group #:  SCHEDULE INFORMATION: Date: 9/11 Time: Location: [facility]

## 2017-03-23 ENCOUNTER — Telehealth: Payer: Self-pay | Admitting: Gastroenterology

## 2017-03-23 NOTE — Telephone Encounter (Signed)
Patient has some questions to ask regarding what to bring to her procedure. (c pap machine, sugar metor) Please call

## 2017-03-24 NOTE — Telephone Encounter (Signed)
Went over colonoscopy instructions with pt along with medications and foods she can eat.  Pt verbalized understanding of these instructions but advised her to contact me again if she has any new questions.

## 2017-03-27 DIAGNOSIS — Z79899 Other long term (current) drug therapy: Secondary | ICD-10-CM | POA: Diagnosis not present

## 2017-03-27 LAB — BASIC METABOLIC PANEL
CREATININE: 1.3 — AB (ref <=1.1)
Glucose: 100
POTASSIUM: 5.4 — AB (ref 3.4–5.3)
SODIUM: 144 (ref 137–147)

## 2017-03-27 LAB — CBC AND DIFFERENTIAL
Hemoglobin: 11.3 — AB (ref 12.0–16.0)
Platelets: 127 — AB (ref 150–399)
WBC: 801

## 2017-03-27 LAB — HEPATIC FUNCTION PANEL
ALK PHOS: 67 (ref 25–125)
ALT: 16 (ref 7–35)
AST: 18 (ref 13–35)
Bilirubin, Total: 0.2

## 2017-04-04 ENCOUNTER — Encounter: Payer: Self-pay | Admitting: Family Medicine

## 2017-04-08 ENCOUNTER — Emergency Department
Admission: EM | Admit: 2017-04-08 | Discharge: 2017-04-08 | Disposition: A | Discharge status: Home / Self Care | Payer: Medicare Other | Attending: Emergency Medicine | Admitting: Emergency Medicine

## 2017-04-08 ENCOUNTER — Encounter: Payer: Self-pay | Admitting: *Deleted

## 2017-04-08 DIAGNOSIS — Z79899 Other long term (current) drug therapy: Secondary | ICD-10-CM | POA: Insufficient documentation

## 2017-04-08 DIAGNOSIS — F319 Bipolar disorder, unspecified: Secondary | ICD-10-CM | POA: Insufficient documentation

## 2017-04-08 DIAGNOSIS — E1140 Type 2 diabetes mellitus with diabetic neuropathy, unspecified: Secondary | ICD-10-CM | POA: Diagnosis not present

## 2017-04-08 DIAGNOSIS — Z7984 Long term (current) use of oral hypoglycemic drugs: Secondary | ICD-10-CM | POA: Diagnosis not present

## 2017-04-08 DIAGNOSIS — F5105 Insomnia due to other mental disorder: Secondary | ICD-10-CM | POA: Diagnosis not present

## 2017-04-08 DIAGNOSIS — F310 Bipolar disorder, current episode hypomanic: Secondary | ICD-10-CM | POA: Diagnosis not present

## 2017-04-08 DIAGNOSIS — E1121 Type 2 diabetes mellitus with diabetic nephropathy: Secondary | ICD-10-CM | POA: Diagnosis present

## 2017-04-08 DIAGNOSIS — G4700 Insomnia, unspecified: Secondary | ICD-10-CM | POA: Diagnosis present

## 2017-04-08 DIAGNOSIS — I129 Hypertensive chronic kidney disease with stage 1 through stage 4 chronic kidney disease, or unspecified chronic kidney disease: Secondary | ICD-10-CM | POA: Diagnosis not present

## 2017-04-08 DIAGNOSIS — N183 Chronic kidney disease, stage 3 (moderate): Secondary | ICD-10-CM | POA: Insufficient documentation

## 2017-04-08 DIAGNOSIS — R4589 Other symptoms and signs involving emotional state: Secondary | ICD-10-CM | POA: Diagnosis not present

## 2017-04-08 DIAGNOSIS — F99 Mental disorder, not otherwise specified: Secondary | ICD-10-CM

## 2017-04-08 DIAGNOSIS — Z85038 Personal history of other malignant neoplasm of large intestine: Secondary | ICD-10-CM | POA: Insufficient documentation

## 2017-04-08 LAB — VALPROIC ACID LEVEL: Valproic Acid Lvl: 38 ug/mL — ABNORMAL LOW (ref 50.0–100.0)

## 2017-04-08 MED ORDER — QUETIAPINE FUMARATE 100 MG PO TABS: 100.0000 mg | ORAL_TABLET | Freq: Every day | ORAL | 0 refills | Status: DC

## 2017-04-08 NOTE — ED Notes (Signed)

## 2017-04-08 NOTE — Consult Note (Signed)
[facility] Consult   Reason for Consult:  Consult for 66 year old woman with a history of bipolar disorder who came to see [place] because of insomnia Referring Physician:  Eual Fines Patient Identification: Gina [name] MRN:  [id number] Principal Diagnosis: Bipolar affective disorder, current episode hypomanic [facility]) Diagnosis:   Patient Active Problem List   Diagnosis Date Noted  . Bipolar affective disorder, current episode hypomanic ([place]) [F31.0] 04/08/2017  . Essential hypertension with goal blood pressure less than 130/80 [I10] 03/16/2017  . Low back pain radiating to left lower extremity [M54.5] 02/26/2017  . Abnormal TSH [R94.6] 09/10/2016  . Thrombocytopenia ([place]) [D69.6] 06/15/2016  . Medicare annual wellness visit, subsequent [Z00.00] 06/04/2016  . Advanced care planning/counseling discussion [Z71.89] 06/04/2016  . External hemorrhoid [K64.4] 04/02/2016  . Chronic constipation [K59.09] 01/04/2016  . Bipolar I disorder, most recent episode (or current) manic ([place]) [F31.10] 12/03/2015  . Acute renal failure superimposed on stage 3 chronic kidney disease ([place]) [N17.9, N18.3] 03/30/2015  . NASH (nonalcoholic steatohepatitis) [K75.81] 03/28/2015  . Gallbladder calculus without cholecystitis [K80.20] 03/28/2015  . Gout [M10.9] 03/27/2015  . HCV antibody positive [R76.8] 03/27/2015  . History of bladder cancer [Z85.51] 03/27/2015  . History of bundle branch block [Z86.79] 03/27/2015  . Impaired fasting glucose [R73.01] 03/27/2015  . Slow transit constipation [K59.01] 03/27/2015  . Transaminitis [R74.0] 03/27/2015  . Genetic testing [Z13.79] 02/28/2015  . Loss of memory [R41.3] 09/04/2014  . Controlled type 2 diabetes mellitus with diabetic nephropathy ([place]) [E11.21] 06/07/2014  . Hyperlipidemia [E78.5]   . IDA (iron deficiency anemia) [D50.9] 01/26/2013  . Personal history of colonic adenomas and colon cancer [Z86.010] 11/11/2012  . OSA on CPAP [G47.33, Z99.89]    . CKD (chronic kidney disease) stage 3, GFR 30-59 ml/min [N18.3]   . Obesity, Class II, BMI 35-39.9, with comorbidity ([place]) [E66.9]   . HTN (hypertension), benign [I10] 04/07/2012  . Hx of colon cancer, stage I [Z85.038] 08/05/2011    Total Time spent with patient: 1 hour  Subjective:   Gina [name] is a 66 y.o. female patient admitted with "I haven't slept in 2 days".  HPI:  Patient interviewed chart reviewed. Tried to speak to someone at [facility] and left a message. This is a 66 year old woman with a long history of bipolar disorder. She comes in saying that she has not slept in the past 2 days. She is feeling a little bit nervous. She does not describe however any euphoria and denies any psychosis. When I ask her about psychotic symptoms she became slightly hyper religious but didn't make any bizarre or delusional statements. For the most part she stays on topic and appears to be lucid. No sign that she is been failing to take care of herself. She brought all of her medicine with her and had a clear understanding of how she is supposed to take it. She was not however able to be very clear with me about whether there had been any recent medicine changes. She seems to be of the belief that the trazodone she was taking was only recently added although the old chart would suggest she's had when necessary trazodone for a while. Using any drugs or alcohol. She denies any particular new psychosocial stresses.  Medical history: Diabetes which is under pretty good control with her current medicine regimen. Same with her hypertension and dyslipidemia.  Social history: Patient never married. Lives with her sister. The 2 of them helped to take care of each other. Has been a  stable situation for years.  Substance abuse history: No history of alcohol or drug abuse  Past Psychiatric History: Patient has a documented history of bipolar disorder. She's had several hospitalizations most recently here in  2017. Typically her presentation will be manic with poor sleep and hyper religiosity and disorganized speech. There is no history of suicide attempts or violence. Multiple medicines have been tried although she has been most stable on Depakote and antipsychotics. Last time she was here she was discharged on a combination of Zyprexa and Haldol. Right now she appears to be on risperidone judging from her chart has been stable for at least the last several months.  Risk to Self: Is patient at risk for suicide?: No Risk to Others:   Prior Inpatient Therapy:   Prior Outpatient Therapy:    Past Medical History:  Past Medical History:  Diagnosis Date  . Anemia in chronic kidney disease   . Bipolar depression [facility])    sees psychiatrist - psych admission 03/2015  . Bladder cancer ([place]) 1990's  . Blood transfusion without reported diagnosis 2009  . Cataract    left  . CKD (chronic kidney disease) stage 3, GFR 30-59 ml/min   . Colon cancer ([place]) 1990's  . DJD (degenerative joint disease), lumbar    chronic lower back pain  . Enterocutaneous fistula 04/07/2012   completed PT 06/2012 ([facility])  . Family history of breast cancer   . Family history of colon cancer   . Family history of stomach cancer   . GERD (gastroesophageal reflux disease)   . History of bladder cancer 1997  . History of colon cancer    s/p surgery  . History of uterine cancer    s/p hysterectomy  . Hyperlipidemia   . Hypertension   . IDA (iron deficiency anemia) 01/2013   thought due to h/o polyps  . Obesity (BMI 30-39.9)   . OSA on CPAP    6cm H2O  . Personal history of colonic adenomas and colon cancer 11/11/2012  . Positive hepatitis C antibody test 09/2014   but negative confirmatory testing  . RBBB   . Uncontrolled type 2 diabetes mellitus with nephropathy ([place]) 06/07/2014   completed DSME 02/2016    Past Surgical History:  Procedure Laterality Date  . BREAST BIOPSY Right 01/2014   fibroadenoma  . COLONOSCOPY   07/2009  . COLONOSCOPY  11/2012   2 tubular adenomas, mild diverticulosis, pending genetic testing for Lynch syndrome [name]) rpt 2 yrs  . COLONOSCOPY  02/2015   TA, diverticulosis, rpt 2 yrs [name])  . DEXA  12/2009   WNL  . DOBUTAMINE STRESS ECHO  12/2009   no evidence of ischemia  . HERNIA REPAIR  02/04/12  . i & d abdominal wound  02/19/12  . LEFT OOPHORECTOMY  2005  . PARTIAL COLECTOMY  about 2008   for colon cancer  . PARTIAL HYSTERECTOMY  1981   uterine cancer, R ovary remains  . Reexploration of abdominal wound and Allograft placemet  02/26/12  . Removal of infected mesh and abdominal wound vac placement  02/24/12  . sleep study  02/2014   OSA - [facility]HI 55, nadir 81% [name])   Family History:  Family History  Problem Relation Age of Onset  . Colon cancer Mother 54  . Stomach cancer Mother        dx in her 33s?  Marland Kitchen Colon cancer Sister 32       Maternal half sister  . CAD Father  MI  . Mental illness Sister        anxiety/depression  . Breast cancer Maternal Grandmother   . Diabetes Brother   . Diabetes Brother   . Diabetes Other        aunts and uncles both sides  . Arthritis Other        strong fmhx  . Esophageal cancer Neg Hx   . Rectal cancer Neg Hx    Family Psychiatric  History: History of depression and at least 1 sister. Social History:  History  Alcohol Use No     History  Drug Use No    Social History   Social History  . Marital status: Single    Spouse name: N/A  . Number of children: 0  . Years of education: N/A   Social History Main Topics  . Smoking status: Never Smoker  . Smokeless tobacco: Never Used  . Alcohol use No  . Drug use: No  . Sexual activity: No   Other Topics Concern  . None   Social History Narrative   Lives with sister, no pets   Occupation: disabled, for bipolar and arthritis   Edu: GED   Activity: take walks   Diet: good water, vegetables daily   Religion: [place], Dr.  Jimmye Norman (ph [phone])   Additional Social History:    Allergies:   Allergies  Allergen Reactions  . Risperidone And Related Other (See Comments)    Reaction:  Altered mental status   . Penicillins Other (See Comments)    Pt states that this med knocks her out.  Has patient had a PCN reaction causing immediate rash, facial/tongue/throat swelling, SOB or lightheadedness with hypotension Unsure  Has patient had a PCN reaction causing severe rash involving mucus membranes or skin necrosis Unsure  Has patient had a PCN reaction that required hospitalization Unsure  Has patient had a PCN reaction occurring within the last 10 years Unsure  If all of the above answers are "NO", then may proceed with Cephalosporin use.  Clementeen Hoof [Iodinated Diagnostic Agents] Rash  . Tetanus Toxoids Rash       . Zetia [Ezetimibe] Rash    Labs: No results found for this or any previous visit (from the past 48 hour(s)).  No current facility-administered medications for this encounter.    Current Outpatient Prescriptions  Medication Sig Dispense Refill  . ACCU-CHEK SOFTCLIX LANCETS lancets Check blood sugar before eating breakfast and as needed. Dx E11.21 100 each 3  . acetaminophen (TYLENOL) 500 MG tablet Take 1 tablet (500 mg total) by mouth 3 (three) times daily as needed.    . Cholecalciferol (VITAMIN D) 2000 units CAPS Take 1 capsule by mouth daily.    . cholecalciferol (VITAMIN D) 400 units TABS tablet Take by mouth.    . divalproex (DEPAKOTE) 500 MG DR tablet Take 1 tablet (500 mg total) by mouth 2 (two) times daily with a meal. 60 tablet 3  . docusate sodium (COLACE) 100 MG capsule TAKE TWO CAPSULES BY MOUTH TWO TIMES A DAY 180 capsule 1  . ferrous sulfate 325 (65 FE) MG tablet Take 1 tablet (325 mg total) by mouth daily with breakfast.  3  . glucose blood (ACCU-CHEK AVIVA PLUS) test strip Use to check sugar before eating breakfast and as needed. Dx: E11.21 100 each 3  . lamoTRIgine (LAMICTAL)  25 MG tablet Take by mouth.    . lovastatin (  MEVACOR) 40 MG tablet TAKE ONE TABLET BY MOUTH AT BEDTIME. 28 tablet 6  . metFORMIN (GLUCOPHAGE) 500 MG tablet Take 1 tablet (500 mg total) by mouth 2 (two) times daily with a meal. 60 tablet 6  . methocarbamol (ROBAXIN) 500 MG tablet Take 1 tablet (500 mg total) by mouth 2 (two) times daily as needed for muscle spasms. 30 tablet 0  . metoprolol tartrate (LOPRESSOR) 25 MG tablet TAKE 1 & 1/2 TABLET BY MOUTH TWICE DAILY WITH FOOD 84 tablet 6  . polyethylene glycol powder (GLYCOLAX/MIRALAX) powder TAKE 17 GRAMS BY MOUTH TWICE DAILY AS NEEDED FOR MODERATE CONSTIPATION. 527 g 3  . QUEtiapine (SEROQUEL) 100 MG tablet Take 1 tablet (100 mg total) by mouth at bedtime. 30 tablet 0  . risperiDONE (RISPERDAL) 1 MG tablet Take 1 mg by mouth 2 (two) times daily.    . traZODone (DESYREL) 100 MG tablet Take 100 mg by mouth daily as needed.      Musculoskeletal: Strength & Muscle Tone: within normal limits Gait & Station: normal Patient leans: N/A  Psychiatric Specialty Exam: Physical Exam  Nursing note and vitals reviewed. Constitutional: She appears well-developed and well-nourished.  HENT:  Head: Normocephalic and atraumatic.  Eyes: Pupils are equal, round, and reactive to light. Conjunctivae are normal.  Neck: Normal range of motion.  Cardiovascular: Regular rhythm and normal heart sounds.   GI: Soft.  Musculoskeletal: Normal range of motion.  Neurological: She is alert.  Skin: Skin is warm and dry.  Psychiatric: Her speech is normal and behavior is normal. Judgment and thought content normal. Her mood appears anxious. Cognition and memory are normal.    Review of Systems  Constitutional: Negative.   HENT: Negative.   Eyes: Negative.   Respiratory: Negative.   Cardiovascular: Negative.   Gastrointestinal: Negative.   Musculoskeletal: Negative.   Skin: Negative.   Neurological: Negative.   Psychiatric/Behavioral: Negative for depression,  hallucinations, memory loss, substance abuse and suicidal ideas. The patient is nervous/anxious and has insomnia.     Blood pressure (!) 158/84, pulse (!) 58, temperature 98.9 F (37.2 C), temperature source Oral, resp. rate 18, height 5' 2"  (1.575 m), weight 197 lb (89.4 kg), SpO2 100 %.Body mass index is 36.03 kg/m.  General Appearance: Casual  Eye Contact:  Good  Speech:  Clear and Coherent  Volume:  Increased  Mood:  Anxious  Affect:  Appropriate  Thought Process:  Goal Directed  Orientation:  Full (Time, Place, and Person)  Thought Content:  Logical and Tangential  Suicidal Thoughts:  No  Homicidal Thoughts:  No  Memory:  Immediate;   Fair Recent;   Fair Remote;   Fair  Judgement:  Fair  Insight:  Fair  Psychomotor Activity:  Normal  Concentration:  Concentration: Good  Recall:  [place] of Knowledge:  Fair  Language:  Fair  Akathisia:  No  Handed:  Right  AIMS (if indicated):     Assets:  Communication Skills Desire for Improvement Financial Resources/Insurance Housing Resilience Social Support  ADL's:  Intact  Cognition:  WNL  Sleep:        Treatment Plan Summary: Medication management and Plan This is a 66 year old woman who presents with what I would call hypomania. She hasn't slept for a couple of days, she is a little more hyper religious and euphoric than at her baseline however there is no sign of psychosis and no sign of dangerousness. I spoke with a representative at [facility] who tells me that the  team is aware that the patient is not at her baseline. However, I do not think she meets commitment criteria nor does she necessarily require inpatient level treatment. Particularly given that she has a stable and safe living situation, is reliable with her medicine and has close outpatient follow-up. I propose that we discontinue the trazodone for now and replace it with Seroquel 100 mg at night to be taken along with her usual medicine. Patient is agreeable to this  plan. I spoke with the pharmacist at [facility] who agreed to bubble the medicine up and have it delivered to her. Patient is agreeable to the plan. Case reviewed with emergency room doctor. Patient aware that she can return if needed.  Disposition: No evidence of imminent risk to self or others at present.   Patient does not meet criteria for psychiatric inpatient admission. Supportive therapy provided about ongoing stressors.  Alethia Berthold, MD 04/08/2017 3:01 PM

## 2017-04-08 NOTE — Discharge Instructions (Signed)
You have been seen in the Emergency Department (ED)  today for a psychiatric complaint.  You have been evaluated by psychiatry and we believe you are safe to be discharged from the hospital.    Please return to the Emergency Department (ED)  immediately if you have ANY thoughts of hurting yourself or anyone else, so that we may help you.  Please avoid alcohol and drug use.  Follow up with your doctor and/or therapist as soon as possible regarding today's ED  visit.   You may call crisis hotline for [facility] at [phone].

## 2017-04-08 NOTE — ED Provider Notes (Addendum)
[facility] Emergency Department Provider Note  ____________________________________________  Time seen: Approximately 2:38 PM  I have reviewed the triage vital signs and the nursing notes.   HISTORY  Chief Complaint Insomnia   HPI Gina [name] is a 66 y.o. female with a history of bipolar disorder who presents for evaluation of insomnia. Patient states thatshe has not been able to sleep for 2 nights. Her insomnia is new for the last 2 days and severe. She was given a prescription of trazodone for 7 pills and the pack is intact with no missing pills. She thinks this is a new pack that she received today and the old pack was taken away by the act team nurse who saw her today. She is not sure however if she has been taking trazodone or if this is a new medication. She is very upset saying "I am a mental patient and I need to see a psychiatrist, I don;t have a psychiatrist and all I see is a Marine scientist. Mental patients need psychiatrists and not nurses". She denies SI or HI. She denies hallucinations. She endorses compliance with her psych meds.   Past Medical History:  Diagnosis Date  . Anemia in chronic kidney disease   . Bipolar depression [facility])    sees psychiatrist - psych admission 03/2015  . Bladder cancer ([place]) 1990's  . Blood transfusion without reported diagnosis 2009  . Cataract    left  . CKD (chronic kidney disease) stage 3, GFR 30-59 ml/min   . Colon cancer ([place]) 1990's  . DJD (degenerative joint disease), lumbar    chronic lower back pain  . Enterocutaneous fistula 04/07/2012   completed PT 06/2012 ([facility])  . Family history of breast cancer   . Family history of colon cancer   . Family history of stomach cancer   . GERD (gastroesophageal reflux disease)   . History of bladder cancer 1997  . History of colon cancer    s/p surgery  . History of uterine cancer    s/p hysterectomy  . Hyperlipidemia   . Hypertension   . IDA (iron  deficiency anemia) 01/2013   thought due to h/o polyps  . Obesity (BMI 30-39.9)   . OSA on CPAP    6cm H2O  . Personal history of colonic adenomas and colon cancer 11/11/2012  . Positive hepatitis C antibody test 09/2014   but negative confirmatory testing  . RBBB   . Uncontrolled type 2 diabetes mellitus with nephropathy ([place]) 06/07/2014   completed DSME 02/2016    Patient Active Problem List   Diagnosis Date Noted  . Essential hypertension with goal blood pressure less than 130/80 03/16/2017  . Low back pain radiating to left lower extremity 02/26/2017  . Abnormal TSH 09/10/2016  . Thrombocytopenia ([place]) 06/15/2016  . Medicare annual wellness visit, subsequent 06/04/2016  . Advanced care planning/counseling discussion 06/04/2016  . External hemorrhoid 04/02/2016  . Chronic constipation 01/04/2016  . Bipolar I disorder, most recent episode (or current) manic ([place]) 12/03/2015  . Acute renal failure superimposed on stage 3 chronic kidney disease ([place]) 03/30/2015  . NASH (nonalcoholic steatohepatitis) 03/28/2015  . Gallbladder calculus without cholecystitis 03/28/2015  . Gout 03/27/2015  . HCV antibody positive 03/27/2015  . History of bladder cancer 03/27/2015  . History of bundle branch block 03/27/2015  . Impaired fasting glucose 03/27/2015  . Slow transit constipation 03/27/2015  . Transaminitis 03/27/2015  . Genetic testing 02/28/2015  . Loss of memory 09/04/2014  . Controlled  type 2 diabetes mellitus with diabetic nephropathy ([place]) 06/07/2014  . Hyperlipidemia   . IDA (iron deficiency anemia) 01/26/2013  . Personal history of colonic adenomas and colon cancer 11/11/2012  . OSA on CPAP   . CKD (chronic kidney disease) stage 3, GFR 30-59 ml/min   . Obesity, Class II, BMI 35-39.9, with comorbidity ([place])   . HTN (hypertension), benign 04/07/2012  . Hx of colon cancer, stage I 08/05/2011    Past Surgical History:  Procedure Laterality Date  . BREAST BIOPSY Right 01/2014    fibroadenoma  . COLONOSCOPY  07/2009  . COLONOSCOPY  11/2012   2 tubular adenomas, mild diverticulosis, pending genetic testing for Lynch syndrome [name]) rpt 2 yrs  . COLONOSCOPY  02/2015   TA, diverticulosis, rpt 2 yrs [name])  . DEXA  12/2009   WNL  . DOBUTAMINE STRESS ECHO  12/2009   no evidence of ischemia  . HERNIA REPAIR  02/04/12  . i & d abdominal wound  02/19/12  . LEFT OOPHORECTOMY  2005  . PARTIAL COLECTOMY  about 2008   for colon cancer  . PARTIAL HYSTERECTOMY  1981   uterine cancer, R ovary remains  . Reexploration of abdominal wound and Allograft placemet  02/26/12  . Removal of infected mesh and abdominal wound vac placement  02/24/12  . sleep study  02/2014   OSA - AHI 55, nadir 81% [name])    Prior to Admission medications   Medication Sig Start Date End Date Taking? Authorizing Provider  ACCU-CHEK SOFTCLIX LANCETS lancets Check blood sugar before eating breakfast and as needed. Dx E11.21 10/08/16   Ria Bush, MD  acetaminophen (TYLENOL) 500 MG tablet Take 1 tablet (500 mg total) by mouth 3 (three) times daily as needed. 02/26/17   Ria Bush, MD  Cholecalciferol (VITAMIN D) 2000 units CAPS Take 1 capsule by mouth daily.    [provider]  cholecalciferol (VITAMIN D) 400 units TABS tablet Take by mouth.    [provider]  divalproex (DEPAKOTE) 500 MG DR tablet Take 1 tablet (500 mg total) by mouth 2 (two) times daily with a meal. 01/15/16   Ria Bush, MD  docusate sodium (COLACE) 100 MG capsule TAKE TWO CAPSULES BY MOUTH TWO TIMES A DAY 05/13/16   Ria Bush, MD  ferrous sulfate 325 (65 FE) MG tablet Take 1 tablet (325 mg total) by mouth daily with breakfast. 01/03/17   Ria Bush, MD  glucose blood (ACCU-CHEK AVIVA PLUS) test strip Use to check sugar before eating breakfast and as needed. Dx: E11.21 09/25/16   Ria Bush, MD  lamoTRIgine (LAMICTAL) 25 MG tablet Take by mouth.    [provider]    lovastatin (MEVACOR) 40 MG tablet TAKE ONE TABLET BY MOUTH AT BEDTIME. 11/20/16   Ria Bush, MD  metFORMIN (GLUCOPHAGE) 500 MG tablet Take 1 tablet (500 mg total) by mouth 2 (two) times daily with a meal. 01/03/17   Ria Bush, MD  methocarbamol (ROBAXIN) 500 MG tablet Take 1 tablet (500 mg total) by mouth 2 (two) times daily as needed for muscle spasms. 02/26/17   Ria Bush, MD  metoprolol tartrate (LOPRESSOR) 25 MG tablet TAKE 1 & 1/2 TABLET BY MOUTH TWICE DAILY WITH FOOD 02/06/17   Ria Bush, MD  polyethylene glycol powder (GLYCOLAX/MIRALAX) powder TAKE 17 GRAMS BY MOUTH TWICE DAILY AS NEEDED FOR MODERATE CONSTIPATION. 12/15/16   Ria Bush, MD  risperiDONE (RISPERDAL) 1 MG tablet Take 1 mg by mouth 2 (two) times daily.  [provider]  traZODone (DESYREL) 100 MG tablet Take 100 mg by mouth daily as needed. 02/11/17   [provider]    Allergies Risperidone and related; Penicillins; Ivp dye [iodinated diagnostic agents]; Tetanus toxoids; and Zetia [ezetimibe]  Family History  Problem Relation Age of Onset  . Colon cancer Mother 54  . Stomach cancer Mother        dx in her 41s?  Marland Kitchen Colon cancer Sister 48       Maternal half sister  . CAD Father        MI  . Mental illness Sister        anxiety/depression  . Breast cancer Maternal Grandmother   . Diabetes Brother   . Diabetes Brother   . Diabetes Other        aunts and uncles both sides  . Arthritis Other        strong fmhx  . Esophageal cancer Neg Hx   . Rectal cancer Neg Hx     Social History Social History  Substance Use Topics  . Smoking status: Never Smoker  . Smokeless tobacco: Never Used  . Alcohol use No    Review of Systems  Constitutional: Negative for fever. Eyes: Negative for visual changes. ENT: Negative for sore throat. Neck: No neck pain  Cardiovascular: Negative for chest pain. Respiratory: Negative for shortness of breath. Gastrointestinal:  Negative for abdominal pain, vomiting or diarrhea. Genitourinary: Negative for dysuria. Musculoskeletal: Negative for back pain. Skin: Negative for rash. Neurological: Negative for headaches, weakness or numbness. Psych: No SI or HI. + insomnia  ____________________________________________   PHYSICAL EXAM:  VITAL SIGNS: ED Triage Vitals  Enc Vitals Group     BP 04/08/17 1319 (!) 158/84     Pulse Rate 04/08/17 1319 (!) 58     Resp 04/08/17 1319 18     Temp 04/08/17 1319 98.9 F (37.2 C)     Temp Source 04/08/17 1319 Oral     SpO2 04/08/17 1319 100 %     Weight 04/08/17 1318 197 lb (89.4 kg)     Height 04/08/17 1318 5' 2"  (1.575 m)     Head Circumference --      Peak Flow --      Pain Score --      Pain Loc --      Pain Edu? --      Excl. in [place]? --     Constitutional: Alert and oriented. Well appearing and in no apparent distress. HEENT:      Head: Normocephalic and atraumatic.         Eyes: Conjunctivae are normal. Sclera is non-icteric.       Mouth/Throat: Mucous membranes are moist.       Neck: Supple with no signs of meningismus. Cardiovascular: Regular rate and rhythm. No murmurs, gallops, or rubs. 2+ symmetrical distal pulses are present in all extremities. No JVD. Respiratory: Normal respiratory effort. Lungs are clear to auscultation bilaterally. No wheezes, crackles, or rhonchi.  Gastrointestinal: Soft, non tender, and non distended with positive bowel sounds. No rebound or guarding. Musculoskeletal: Nontender with normal range of motion in all extremities. No edema, cyanosis, or erythema of extremities. Neurologic: Normal speech and language. Face is symmetric. Moving all extremities. No gross focal neurologic deficits are appreciated. Skin: Skin is warm, dry and intact. No rash noted. Psychiatric: Mood and affect are normal. Disorganized thought process. No SI or HI  ____________________________________________   LABS (all labs ordered are listed, but  only  abnormal results are displayed)  Labs Reviewed  VALPROIC ACID LEVEL   ____________________________________________  EKG  none  ____________________________________________  RADIOLOGY  none  ____________________________________________   PROCEDURES  Procedure(s) performed: None Procedures Critical Care performed:  None ____________________________________________   INITIAL IMPRESSION / ASSESSMENT AND PLAN / ED COURSE  66 y.o. female with a history of bipolar disorder who presents for evaluation of insomnia x 2 days. Patient with disorganized thought process but no SI or HI. Does not meet criteria for IVC. Will consult psychiatry for evaluation.    _________________________ 3:17 PM on 04/08/2017 -----------------------------------------  patient seen by Dr. Weber Cooks, who started her on Seroquel qHS. Patient has close follow up. Patient cleared by him for discharge  Pertinent labs & imaging results that were available during my care of the patient were reviewed by me and considered in my medical decision making (see chart for details).    ____________________________________________   FINAL CLINICAL IMPRESSION(S) / ED DIAGNOSES  Final diagnoses:  Insomnia due to other mental disorder      NEW MEDICATIONS STARTED DURING THIS VISIT:  New Prescriptions   No medications on file     Note:  This document was prepared using Dragon voice recognition software and may include unintentional dictation errors.    Rudene Re, MD 04/08/17 [place], [place], [place] 04/08/17 [id number]

## 2017-04-08 NOTE — ED Notes (Signed)
BEHAVIORAL HEALTH ROUNDING Patient sleeping: No. Patient alert and oriented: yes Behavior appropriate: Yes.  ; If no, describe:  Nutrition and fluids offered: yes Toileting and hygiene offered: Yes  Sitter present: q15 minute observations and security  monitoring Law enforcement present: Yes  ODS  

## 2017-04-08 NOTE — ED Notes (Signed)
Discharge instrucitons reviewed with her and she verbalized agreement and understanding - [facility] has called her script to [facility] and they will bubble wrap the med and deliver it to her house this evening  - pt verbalizing that someone from [facility] will be giving her a ride home - we have called to secure transportation and we are awaiting a call back

## 2017-04-08 NOTE — ED Triage Notes (Signed)
Pt arrives via EMS, states she has not slept in 2 days, states her medicine is making her eyes burn and she is afraid she is going to "walk the streets because she is a mental health pt", denies SI or HI, denies hallucinations

## 2017-04-11 ENCOUNTER — Emergency Department
Admission: EM | Admit: 2017-04-11 | Discharge: 2017-04-13 | Disposition: A | Discharge status: Other Acute Inpatient Hospital | Payer: Medicare Other | Attending: Emergency Medicine | Admitting: Emergency Medicine

## 2017-04-11 DIAGNOSIS — N183 Chronic kidney disease, stage 3 (moderate): Secondary | ICD-10-CM | POA: Insufficient documentation

## 2017-04-11 DIAGNOSIS — Z8551 Personal history of malignant neoplasm of bladder: Secondary | ICD-10-CM | POA: Insufficient documentation

## 2017-04-11 DIAGNOSIS — E1122 Type 2 diabetes mellitus with diabetic chronic kidney disease: Secondary | ICD-10-CM | POA: Diagnosis not present

## 2017-04-11 DIAGNOSIS — Z79899 Other long term (current) drug therapy: Secondary | ICD-10-CM | POA: Insufficient documentation

## 2017-04-11 DIAGNOSIS — I129 Hypertensive chronic kidney disease with stage 1 through stage 4 chronic kidney disease, or unspecified chronic kidney disease: Secondary | ICD-10-CM | POA: Diagnosis not present

## 2017-04-11 DIAGNOSIS — Z85038 Personal history of other malignant neoplasm of large intestine: Secondary | ICD-10-CM | POA: Insufficient documentation

## 2017-04-11 DIAGNOSIS — Z7984 Long term (current) use of oral hypoglycemic drugs: Secondary | ICD-10-CM | POA: Insufficient documentation

## 2017-04-11 DIAGNOSIS — I10 Essential (primary) hypertension: Secondary | ICD-10-CM | POA: Diagnosis not present

## 2017-04-11 DIAGNOSIS — F310 Bipolar disorder, current episode hypomanic: Secondary | ICD-10-CM | POA: Insufficient documentation

## 2017-04-11 DIAGNOSIS — N390 Urinary tract infection, site not specified: Secondary | ICD-10-CM

## 2017-04-11 DIAGNOSIS — F28 Other psychotic disorder not due to a substance or known physiological condition: Secondary | ICD-10-CM | POA: Diagnosis not present

## 2017-04-11 DIAGNOSIS — G4700 Insomnia, unspecified: Secondary | ICD-10-CM | POA: Diagnosis present

## 2017-04-11 LAB — URINE DRUG SCREEN, QUALITATIVE (ARMC ONLY)
Amphetamines, Ur Screen: NOT DETECTED
BARBITURATES, UR SCREEN: NOT DETECTED
BENZODIAZEPINE, UR SCRN: NOT DETECTED
CANNABINOID 50 NG, UR ~~LOC~~: NOT DETECTED
Cocaine Metabolite,Ur ~~LOC~~: NOT DETECTED
MDMA (Ecstasy)Ur Screen: NOT DETECTED
Methadone Scn, Ur: NOT DETECTED
Opiate, Ur Screen: NOT DETECTED
Phencyclidine (PCP) Ur S: NOT DETECTED
Tricyclic, Ur Screen: NOT DETECTED

## 2017-04-11 LAB — COMPREHENSIVE METABOLIC PANEL
ALK PHOS: 72 U/L (ref 38–126)
ALT: 22 U/L (ref 14–54)
AST: 26 U/L (ref 15–41)
Albumin: 4.2 g/dL (ref 3.5–5.0)
Anion gap: 12 (ref 5–15)
BUN: 28 mg/dL — AB (ref 6–20)
CALCIUM: 10 mg/dL (ref 8.9–10.3)
CO2: 23 mmol/L (ref 22–32)
CREATININE: 1.54 mg/dL — AB (ref 0.44–1.00)
Chloride: 104 mmol/L (ref 101–111)
GFR calc non Af Amer: 34 mL/min — ABNORMAL LOW (ref >60)
GFR, EST AFRICAN AMERICAN: 39 mL/min — AB (ref >60)
Glucose, Bld: 125 mg/dL — ABNORMAL HIGH (ref 65–99)
Potassium: 4.5 mmol/L (ref 3.5–5.1)
SODIUM: 139 mmol/L (ref 135–145)
Total Bilirubin: 0.6 mg/dL (ref 0.3–1.2)
Total Protein: 8.3 g/dL — ABNORMAL HIGH (ref 6.5–8.1)

## 2017-04-11 LAB — URINALYSIS, COMPLETE (UACMP) WITH MICROSCOPIC
Bacteria, UA: NONE SEEN
Bilirubin Urine: NEGATIVE
GLUCOSE, UA: NEGATIVE mg/dL
KETONES UR: 20 mg/dL — AB
Nitrite: NEGATIVE
PH: 5 (ref 5.0–8.0)
Protein, ur: NEGATIVE mg/dL
Specific Gravity, Urine: 1.013 (ref 1.005–1.030)

## 2017-04-11 LAB — CBC
HCT: 40.3 % (ref 35.0–47.0)
Hemoglobin: 13.9 g/dL (ref 12.0–16.0)
MCH: 32.3 pg (ref 26.0–34.0)
MCHC: 34.6 g/dL (ref 32.0–36.0)
MCV: 93.3 fL (ref 80.0–100.0)
PLATELETS: 191 10*3/uL (ref 150–440)
RBC: 4.32 MIL/uL (ref 3.80–5.20)
RDW: 13.7 % (ref 11.5–14.5)
WBC: 12 10*3/uL — AB (ref 3.6–11.0)

## 2017-04-11 LAB — VALPROIC ACID LEVEL: Valproic Acid Lvl: 36 ug/mL — ABNORMAL LOW (ref 50.0–100.0)

## 2017-04-11 LAB — ETHANOL: Alcohol, Ethyl (B): <5 mg/dL (ref <5)

## 2017-04-11 MED ORDER — CEPHALEXIN 500 MG PO CAPS
500.0000 mg | ORAL_CAPSULE | Freq: Once | ORAL | Status: AC
  Administered 2017-04-11: 500 mg via ORAL
  Filled 2017-04-11: qty 1

## 2017-04-11 MED ORDER — DIVALPROEX SODIUM 500 MG PO DR TAB
500.0000 mg | DELAYED_RELEASE_TABLET | Freq: Two times a day (BID) | ORAL | Status: DC
  Administered 2017-04-12 – 2017-04-13 (×3): 500 mg via ORAL
  Filled 2017-04-11 (×3): qty 1

## 2017-04-11 MED ORDER — PRAVASTATIN SODIUM 40 MG PO TABS
40.0000 mg | ORAL_TABLET | Freq: Every day | ORAL | Status: DC
  Administered 2017-04-12: 40 mg via ORAL
  Filled 2017-04-11: qty 1

## 2017-04-11 MED ORDER — CEPHALEXIN 500 MG PO CAPS
500.0000 mg | ORAL_CAPSULE | Freq: Three times a day (TID) | ORAL | Status: DC
  Administered 2017-04-12 – 2017-04-13 (×4): 500 mg via ORAL
  Filled 2017-04-11 (×4): qty 1

## 2017-04-11 MED ORDER — METFORMIN HCL 500 MG PO TABS
500.0000 mg | ORAL_TABLET | Freq: Two times a day (BID) | ORAL | Status: DC
  Administered 2017-04-12 – 2017-04-13 (×3): 500 mg via ORAL
  Filled 2017-04-11 (×3): qty 1

## 2017-04-11 MED ORDER — DOCUSATE SODIUM 100 MG PO CAPS
200.0000 mg | ORAL_CAPSULE | Freq: Two times a day (BID) | ORAL | Status: DC
  Administered 2017-04-11 – 2017-04-12 (×3): 200 mg via ORAL
  Filled 2017-04-11 (×3): qty 2

## 2017-04-11 MED ORDER — METOPROLOL TARTRATE 25 MG PO TABS
25.0000 mg | ORAL_TABLET | Freq: Two times a day (BID) | ORAL | Status: DC
  Administered 2017-04-11 – 2017-04-12 (×3): 25 mg via ORAL
  Filled 2017-04-11 (×3): qty 1

## 2017-04-11 MED ORDER — LAMOTRIGINE 25 MG PO TABS
25.0000 mg | ORAL_TABLET | Freq: Every day | ORAL | Status: DC
  Administered 2017-04-12: 25 mg via ORAL
  Filled 2017-04-11: qty 1

## 2017-04-11 MED ORDER — QUETIAPINE FUMARATE 200 MG PO TABS
200.0000 mg | ORAL_TABLET | Freq: Every day | ORAL | Status: DC
  Administered 2017-04-11 – 2017-04-12 (×2): 200 mg via ORAL
  Filled 2017-04-11 (×2): qty 1

## 2017-04-11 MED ORDER — SODIUM CHLORIDE 0.9 % IV BOLUS (SEPSIS): 1000.0000 mL | Freq: Once | INTRAVENOUS | Status: DC

## 2017-04-11 NOTE — ED Notes (Signed)
BEHAVIORAL HEALTH ROUNDING Patient sleeping: No. Patient alert and oriented: yes Behavior appropriate: Yes.  ; If no, describe:  Nutrition and fluids offered: Yes  Toileting and hygiene offered: Yes  Sitter present: not applicable Law enforcement present: Yes  

## 2017-04-11 NOTE — ED Notes (Signed)
Report from kim, rn.  

## 2017-04-11 NOTE — ED Notes (Addendum)
Pt has no cell phone or wallet, only clothes, red religion book, and jewlery (2 bracelets and 2 neckplaces) Placed in pt belonging bag.

## 2017-04-11 NOTE — ED Notes (Signed)
BEHAVIORAL HEALTH ROUNDING Patient sleeping: Yes.   Patient alert and oriented: yes Behavior appropriate: Yes.  ; If no, describe:  Nutrition and fluids offered: Yes  Toileting and hygiene offered: Yes  Sitter present: not applicable Law enforcement present: Yes  

## 2017-04-11 NOTE — ED Notes (Signed)
Pt changed into scrubs and belongings secured.

## 2017-04-11 NOTE — ED Provider Notes (Signed)
[facility] Emergency Department Provider Note   ____________________________________________   I have reviewed the triage vital signs and the nursing notes.   HISTORY  Chief Complaint Other; Sleeping Problem; and Psychiatric Evaluation   History limited by: Not Limited   HPI Gina [name] is a 66 y.o. female who presents to the emergency department today because of concerns for continued insomnia and memory issues. Patient was seen in the emergency department 3 days ago for insomnia. Was evaluated by psychiatry at that time. They did change the patient's medications to include Seroquel. Patient states that she is not sure she is remembering to take her medications. She states she has not been sleeping. She denies any thoughts of wanting to hurt herself or others. She denies any medical complaints.   Past Medical History:  Diagnosis Date  . Anemia in chronic kidney disease   . Bipolar depression [facility])    sees psychiatrist - psych admission 03/2015  . Bladder cancer ([place]) 1990's  . Blood transfusion without reported diagnosis 2009  . Cataract    left  . CKD (chronic kidney disease) stage 3, GFR 30-59 ml/min   . Colon cancer ([place]) 1990's  . DJD (degenerative joint disease), lumbar    chronic lower back pain  . Enterocutaneous fistula 04/07/2012   completed PT 06/2012 ([facility])  . Family history of breast cancer   . Family history of colon cancer   . Family history of stomach cancer   . GERD (gastroesophageal reflux disease)   . History of bladder cancer 1997  . History of colon cancer    s/p surgery  . History of uterine cancer    s/p hysterectomy  . Hyperlipidemia   . Hypertension   . IDA (iron deficiency anemia) 01/2013   thought due to h/o polyps  . Obesity (BMI 30-39.9)   . OSA on CPAP    6cm H2O  . Personal history of colonic adenomas and colon cancer 11/11/2012  . Positive hepatitis C antibody test 09/2014   but negative confirmatory  testing  . RBBB   . Uncontrolled type 2 diabetes mellitus with nephropathy ([place]) 06/07/2014   completed DSME 02/2016    Patient Active Problem List   Diagnosis Date Noted  . Bipolar affective disorder, current episode hypomanic ([place]) 04/08/2017  . Essential hypertension with goal blood pressure less than 130/80 03/16/2017  . Low back pain radiating to left lower extremity 02/26/2017  . Abnormal TSH 09/10/2016  . Thrombocytopenia ([place]) 06/15/2016  . Medicare annual wellness visit, subsequent 06/04/2016  . Advanced care planning/counseling discussion 06/04/2016  . External hemorrhoid 04/02/2016  . Chronic constipation 01/04/2016  . Bipolar I disorder, most recent episode (or current) manic ([place]) 12/03/2015  . Acute renal failure superimposed on stage 3 chronic kidney disease ([place]) 03/30/2015  . NASH (nonalcoholic steatohepatitis) 03/28/2015  . Gallbladder calculus without cholecystitis 03/28/2015  . Gout 03/27/2015  . HCV antibody positive 03/27/2015  . History of bladder cancer 03/27/2015  . History of bundle branch block 03/27/2015  . Impaired fasting glucose 03/27/2015  . Slow transit constipation 03/27/2015  . Transaminitis 03/27/2015  . Genetic testing 02/28/2015  . Loss of memory 09/04/2014  . Controlled type 2 diabetes mellitus with diabetic nephropathy ([place]) 06/07/2014  . Hyperlipidemia   . IDA (iron deficiency anemia) 01/26/2013  . Personal history of colonic adenomas and colon cancer 11/11/2012  . OSA on CPAP   . CKD (chronic kidney disease) stage 3, GFR 30-59 ml/min   . Obesity,  Class II, BMI 35-39.9, with comorbidity ([place])   . HTN (hypertension), benign 04/07/2012  . Hx of colon cancer, stage I 08/05/2011    Past Surgical History:  Procedure Laterality Date  . BREAST BIOPSY Right 01/2014   fibroadenoma  . COLONOSCOPY  07/2009  . COLONOSCOPY  11/2012   2 tubular adenomas, mild diverticulosis, pending genetic testing for Lynch syndrome [name]) rpt 2 yrs  .  COLONOSCOPY  02/2015   TA, diverticulosis, rpt 2 yrs [name])  . DEXA  12/2009   WNL  . DOBUTAMINE STRESS ECHO  12/2009   no evidence of ischemia  . HERNIA REPAIR  02/04/12  . i & d abdominal wound  02/19/12  . LEFT OOPHORECTOMY  2005  . PARTIAL COLECTOMY  about 2008   for colon cancer  . PARTIAL HYSTERECTOMY  1981   uterine cancer, R ovary remains  . Reexploration of abdominal wound and Allograft placemet  02/26/12  . Removal of infected mesh and abdominal wound vac placement  02/24/12  . sleep study  02/2014   OSA - AHI 55, nadir 81% [name])    Prior to Admission medications   Medication Sig Start Date End Date Taking? Authorizing Provider  ACCU-CHEK SOFTCLIX LANCETS lancets Check blood sugar before eating breakfast and as needed. Dx E11.21 10/08/16   Ria Bush, MD  acetaminophen (TYLENOL) 500 MG tablet Take 1 tablet (500 mg total) by mouth 3 (three) times daily as needed. 02/26/17   Ria Bush, MD  Cholecalciferol (VITAMIN D) 2000 units CAPS Take 1 capsule by mouth daily.    [provider]  cholecalciferol (VITAMIN D) 400 units TABS tablet Take by mouth.    [provider]  divalproex (DEPAKOTE) 500 MG DR tablet Take 1 tablet (500 mg total) by mouth 2 (two) times daily with a meal. 01/15/16   Ria Bush, MD  docusate sodium (COLACE) 100 MG capsule TAKE TWO CAPSULES BY MOUTH TWO TIMES A DAY 05/13/16   Ria Bush, MD  ferrous sulfate 325 (65 FE) MG tablet Take 1 tablet (325 mg total) by mouth daily with breakfast. 01/03/17   Ria Bush, MD  glucose blood (ACCU-CHEK AVIVA PLUS) test strip Use to check sugar before eating breakfast and as needed. Dx: E11.21 09/25/16   Ria Bush, MD  lamoTRIgine (LAMICTAL) 25 MG tablet Take by mouth.    [provider]  lovastatin (MEVACOR) 40 MG tablet TAKE ONE TABLET BY MOUTH AT BEDTIME. 11/20/16   Ria Bush, MD  metFORMIN (GLUCOPHAGE) 500 MG tablet Take 1 tablet (500 mg total) by mouth  2 (two) times daily with a meal. 01/03/17   Ria Bush, MD  methocarbamol (ROBAXIN) 500 MG tablet Take 1 tablet (500 mg total) by mouth 2 (two) times daily as needed for muscle spasms. 02/26/17   Ria Bush, MD  metoprolol tartrate (LOPRESSOR) 25 MG tablet TAKE 1 & 1/2 TABLET BY MOUTH TWICE DAILY WITH FOOD 02/06/17   Ria Bush, MD  polyethylene glycol powder (GLYCOLAX/MIRALAX) powder TAKE 17 GRAMS BY MOUTH TWICE DAILY AS NEEDED FOR MODERATE CONSTIPATION. 12/15/16   Ria Bush, MD  QUEtiapine (SEROQUEL) 100 MG tablet Take 1 tablet (100 mg total) by mouth at bedtime. 04/08/17   Clapacs, Madie [place], MD  risperiDONE (RISPERDAL) 1 MG tablet Take 1 mg by mouth 2 (two) times daily.    [provider]  traZODone (DESYREL) 100 MG tablet Take 100 mg by mouth daily as needed. 02/11/17   [provider]    Allergies Risperidone  and related; Penicillins; Ivp dye [iodinated diagnostic agents]; Tetanus toxoids; and Zetia [ezetimibe]  Family History  Problem Relation Age of Onset  . Colon cancer Mother 67  . Stomach cancer Mother        dx in her 26s?  Marland Kitchen Colon cancer Sister 71       Maternal half sister  . CAD Father        MI  . Mental illness Sister        anxiety/depression  . Breast cancer Maternal Grandmother   . Diabetes Brother   . Diabetes Brother   . Diabetes Other        aunts and uncles both sides  . Arthritis Other        strong fmhx  . Esophageal cancer Neg Hx   . Rectal cancer Neg Hx     Social History Social History  Substance Use Topics  . Smoking status: Never Smoker  . Smokeless tobacco: Never Used  . Alcohol use No    Review of Systems Constitutional: No fever/chills Eyes: No visual changes. ENT: No sore throat. Cardiovascular: Denies chest pain. Respiratory: Denies shortness of breath. Gastrointestinal: No abdominal pain.  No nausea, no vomiting.  No diarrhea.   Genitourinary: Negative for dysuria. Musculoskeletal: Negative for  back pain. Skin: Negative for rash. Neurological: Negative for headaches, focal weakness or numbness.  ____________________________________________   PHYSICAL EXAM:  VITAL SIGNS: ED Triage Vitals  Enc Vitals Group     BP 04/11/17 1553 (!) 162/87     Pulse Rate 04/11/17 1553 (!) 110     Resp --      Temp 04/11/17 1553 98.4 F (36.9 C)     Temp Source 04/11/17 1553 Oral     SpO2 04/11/17 1553 96 %     Weight 04/11/17 1555 197 lb (89.4 kg)     Height 04/11/17 1555 5\' 2"  (1.575 m)   Constitutional: Alert and oriented.  Eyes: Conjunctivae are normal.  ENT   Head: Normocephalic and atraumatic.   Nose: No congestion/rhinnorhea.   Mouth/Throat: Mucous membranes are moist.   Neck: No stridor. Hematological/Lymphatic/Immunilogical: No cervical lymphadenopathy. Cardiovascular: Normal rate, regular rhythm.  No murmurs, rubs, or gallops.  Respiratory: Normal respiratory effort without tachypnea nor retractions. Breath sounds are clear and equal bilaterally. No wheezes/rales/rhonchi. Gastrointestinal: Soft and non tender. No rebound. No guarding.  Genitourinary: Deferred Musculoskeletal: Normal range of motion in all extremities. No lower extremity edema. Neurologic:  Normal speech and language. No gross focal neurologic deficits are appreciated.  Skin:  Skin is warm, dry and intact. No rash noted. Psychiatric: denies SI or HI  ____________________________________________    LABS (pertinent positives/negatives)  Labs Reviewed  COMPREHENSIVE METABOLIC PANEL - Abnormal; Notable for the following:       Result Value   Glucose, Bld 125 (*)    BUN 28 (*)    Creatinine, Ser 1.54 (*)    Total Protein 8.3 (*)    GFR calc non Af Amer 34 (*)    GFR calc Af Amer 39 (*)    All other components within normal limits  CBC - Abnormal; Notable for the following:    WBC 12.0 (*)    All other components within normal limits  URINALYSIS, COMPLETE (UACMP) WITH MICROSCOPIC -  Abnormal; Notable for the following:    Color, Urine YELLOW (*)    APPearance CLEAR (*)    Hgb urine dipstick SMALL (*)    Ketones, ur 20 (*)  Leukocytes, UA MODERATE (*)    Squamous Epithelial / LPF 0-5 (*)    All other components within normal limits  VALPROIC ACID LEVEL - Abnormal; Notable for the following:    Valproic Acid Lvl 36 (*)    All other components within normal limits  URINE CULTURE  ETHANOL  URINE DRUG SCREEN, QUALITATIVE ([facility] ONLY)     ____________________________________________   EKG  None  ____________________________________________    RADIOLOGY  None  ____________________________________________   PROCEDURES  Procedures  ____________________________________________   INITIAL IMPRESSION / ASSESSMENT AND PLAN / ED COURSE  Pertinent labs & imaging results that were available during my care of the patient were reviewed by me and considered in my medical decision making (see chart for details).  Patient presented to the emergency department today with concerns for continued insomnia. Patient was somewhat tangential and with poor judgment with her speech. Patient was evaluated by specialist on call who felt that she would benefit from inpatient mission. In addition the patient's urine did come back consistent with possible urinary tract infection. Will start patient on antibiotics.  ____________________________________________   FINAL CLINICAL IMPRESSION(S) / ED DIAGNOSES  Final diagnoses:  Other psychotic disorder not due to substance or known physiological condition  Lower urinary tract infection     Note: This dictation was prepared with Dragon dictation. Any transcriptional errors that result from this process are unintentional     Nance Pear, MD 04/11/17 2234

## 2017-04-11 NOTE — ED Notes (Signed)
Called [facility] for consult   [phone]

## 2017-04-11 NOTE — ED Notes (Signed)

## 2017-04-11 NOTE — ED Notes (Signed)
SOC complete, pt drinking water, approx 270mL consumed.

## 2017-04-11 NOTE — ED Notes (Signed)
Attempted venipuncture x2 without success.

## 2017-04-11 NOTE — ED Notes (Signed)
Patient resting on bed in room. No complaints of discomfort at present. Await telepsych consult.

## 2017-04-11 NOTE — ED Triage Notes (Signed)
Pt came to Ed via pov from home. C/o sleeping problems and racing thoughts. History of schizophrenia. Denis si/hi.

## 2017-04-11 NOTE — ED Notes (Signed)
SOC in progress.  

## 2017-04-12 ENCOUNTER — Other Ambulatory Visit: Payer: Self-pay

## 2017-04-12 DIAGNOSIS — F28 Other psychotic disorder not due to a substance or known physiological condition: Secondary | ICD-10-CM | POA: Diagnosis not present

## 2017-04-12 LAB — GLUCOSE, CAPILLARY
GLUCOSE-CAPILLARY: 163 mg/dL — AB (ref 65–99)
Glucose-Capillary: 121 mg/dL — ABNORMAL HIGH (ref 65–99)
Glucose-Capillary: 178 mg/dL — ABNORMAL HIGH (ref 65–99)

## 2017-04-12 MED ORDER — ACETAMINOPHEN 325 MG PO TABS
ORAL_TABLET | ORAL | Status: AC
  Administered 2017-04-12: 650 mg via ORAL
  Filled 2017-04-12: qty 2

## 2017-04-12 MED ORDER — ACETAMINOPHEN 325 MG PO TABS
650.0000 mg | ORAL_TABLET | Freq: Once | ORAL | Status: AC
  Administered 2017-04-12: 650 mg via ORAL

## 2017-04-12 NOTE — ED Notes (Signed)
Pt eating graham crackers

## 2017-04-12 NOTE — ED Notes (Signed)
Pt heart rate checked, 80bpm.

## 2017-04-12 NOTE — ED Provider Notes (Signed)
ED ECG REPORT I, Arta Silence, the attending physician, personally viewed and interpreted this ECG.  Date: 04/12/2017 EKG Time: 820 Rate: 95 Rhythm: normal sinus rhythm QRS Axis: normal Intervals: right bundle branch block, left anterior fascicular block ST/T Wave abnormalities: T-wave inversions V1 through V3 Narrative Interpretation: no evidence of acute ischemia; no significant for change from EKG of 03/25/17   Arta Silence, MD 04/12/17 [phone]

## 2017-04-12 NOTE — ED Notes (Signed)
Pt given meal tray, pt reports does not feel like eating right now

## 2017-04-12 NOTE — ED Notes (Signed)
Called and reinitiated transport, told would not know until 1800 if transport available

## 2017-04-12 NOTE — BH Assessment (Signed)
Writer called [facility] ([phone]) and informed them the patient will not transfer to their facility until tomorrow (04/13/2017) due to a lack of transportation with the [facility].

## 2017-04-12 NOTE — BH Assessment (Signed)
Referral information for Psychiatric Hospitalization faxed to;    [facility] [phone] or [phone])   [facility] [phone]),    [address] [phone]),    [facility] [phone])   [facility] [phone]),    [facility] [phone] or [phone]),    [facility] [phone]),    [facility] [phone]).   [employer]

## 2017-04-12 NOTE — ED Notes (Signed)
Report to felicia, rn.  

## 2017-04-12 NOTE — BH Assessment (Signed)
Writer received phone call from [facility] (Brooke-[phone]), requesting patient transfer be placed on hold until they are able to verify her insurance. Writer updated ER Dian Situ Sherry Ruffing).

## 2017-04-12 NOTE — ED Notes (Signed)
Pt sleeping. 

## 2017-04-12 NOTE — ED Notes (Signed)
Md notified about pt HR. No new orders at this time. Will continue to monitor.

## 2017-04-12 NOTE — ED Notes (Signed)
Blood sugar 121. Pt refusing to eat, MD notified and still instructed to give metformin.

## 2017-04-12 NOTE — BH Assessment (Addendum)
Patient has been accepted to [facility].  Patient assigned to [address]. Accepting physician is Dr. Darra Lis.  Call report to [phone].  Representative was [employer].  ER Staff is aware of it Sherry Ruffing, ER Sect. & Dr. Cherylann Banas, Rose, Patient's Nurse)   Address: [address], [place], [place] [address]

## 2017-04-12 NOTE — ED Notes (Signed)
Called for Sheriff's transport [phone], received call from [facility] informed facility wanted to verify insurance, called and put transport on hold  1520

## 2017-04-12 NOTE — ED Provider Notes (Signed)
-----------------------------------------   11:38 PM on 04/12/2017 -----------------------------------------  Assume care of patient who has a low-grade temperature of 100.1 orally. She is on Keflex for UTI. Will obtain blood cultures, basic lab work, lactate. Patient is resting in no acute distress.  ----------------------------------------- 2:35 AM on 04/13/2017 -----------------------------------------  Patient is afebrile, sleeping in no acute distress. White count has normalized from prior. Renal function stable. Lactic acid slightly elevated from normal. Will infuse IV fluids and recheck lactate.  ----------------------------------------- 6:47 AM on 04/13/2017 -----------------------------------------  Patient is afebrile, blood pressure 133/75. Transient dip in blood pressure as well as oxygenation likely secondary to Seroquel taken at bedtime. Repeat lactate is pending. results of repeat lab work do not point to sepsis. Care will be transferred to oncoming provider.   Paulette Blanch, MD 04/13/17 [phone]

## 2017-04-12 NOTE — ED Notes (Signed)
[facility] 

## 2017-04-12 NOTE — ED Notes (Signed)
Informed by Ian Malkin patient was ok to transport   [phone]

## 2017-04-12 NOTE — BH Assessment (Signed)
Writer received phone call from [facility] ([phone]) stating they were able to verify insurance and that she is able to transfer to their facility.  Writer updated ER [employer].

## 2017-04-12 NOTE — ED Notes (Signed)
Informed by [facility] no transport not available until 04/13/17

## 2017-04-13 ENCOUNTER — Telehealth: Payer: Self-pay | Admitting: Gastroenterology

## 2017-04-13 DIAGNOSIS — E119 Type 2 diabetes mellitus without complications: Secondary | ICD-10-CM | POA: Diagnosis not present

## 2017-04-13 DIAGNOSIS — N183 Chronic kidney disease, stage 3 (moderate): Secondary | ICD-10-CM | POA: Diagnosis not present

## 2017-04-13 DIAGNOSIS — Z79899 Other long term (current) drug therapy: Secondary | ICD-10-CM | POA: Diagnosis not present

## 2017-04-13 DIAGNOSIS — I10 Essential (primary) hypertension: Secondary | ICD-10-CM | POA: Diagnosis not present

## 2017-04-13 DIAGNOSIS — R0789 Other chest pain: Secondary | ICD-10-CM | POA: Diagnosis not present

## 2017-04-13 DIAGNOSIS — E668 Other obesity: Secondary | ICD-10-CM | POA: Diagnosis not present

## 2017-04-13 DIAGNOSIS — F28 Other psychotic disorder not due to a substance or known physiological condition: Secondary | ICD-10-CM | POA: Diagnosis not present

## 2017-04-13 DIAGNOSIS — E559 Vitamin D deficiency, unspecified: Secondary | ICD-10-CM | POA: Diagnosis not present

## 2017-04-13 DIAGNOSIS — F333 Major depressive disorder, recurrent, severe with psychotic symptoms: Secondary | ICD-10-CM | POA: Diagnosis not present

## 2017-04-13 DIAGNOSIS — N390 Urinary tract infection, site not specified: Secondary | ICD-10-CM | POA: Diagnosis not present

## 2017-04-13 DIAGNOSIS — E1122 Type 2 diabetes mellitus with diabetic chronic kidney disease: Secondary | ICD-10-CM | POA: Diagnosis not present

## 2017-04-13 DIAGNOSIS — E785 Hyperlipidemia, unspecified: Secondary | ICD-10-CM | POA: Diagnosis not present

## 2017-04-13 DIAGNOSIS — F310 Bipolar disorder, current episode hypomanic: Secondary | ICD-10-CM | POA: Diagnosis not present

## 2017-04-13 DIAGNOSIS — I129 Hypertensive chronic kidney disease with stage 1 through stage 4 chronic kidney disease, or unspecified chronic kidney disease: Secondary | ICD-10-CM | POA: Diagnosis not present

## 2017-04-13 DIAGNOSIS — F259 Schizoaffective disorder, unspecified: Secondary | ICD-10-CM | POA: Diagnosis not present

## 2017-04-13 LAB — CBC WITH DIFFERENTIAL/PLATELET
BASOS PCT: 0 %
Basophils Absolute: 0 10*3/uL (ref 0–0.1)
Eosinophils Absolute: 0.1 10*3/uL (ref 0–0.7)
Eosinophils Relative: 2 %
HCT: 35.2 % (ref 35.0–47.0)
Hemoglobin: 12.2 g/dL (ref 12.0–16.0)
LYMPHS ABS: 0.4 10*3/uL — AB (ref 1.0–3.6)
Lymphocytes Relative: 5 %
MCH: 32.3 pg (ref 26.0–34.0)
MCHC: 34.7 g/dL (ref 32.0–36.0)
MCV: 92.9 fL (ref 80.0–100.0)
Monocytes Absolute: 1.1 10*3/uL — ABNORMAL HIGH (ref 0.2–0.9)
Monocytes Relative: 13 %
NEUTROS PCT: 80 %
Neutro Abs: 6.8 10*3/uL — ABNORMAL HIGH (ref 1.4–6.5)
Platelets: 146 10*3/uL — ABNORMAL LOW (ref 150–440)
RBC: 3.79 MIL/uL — AB (ref 3.80–5.20)
RDW: 13.6 % (ref 11.5–14.5)
WBC: 8.5 10*3/uL (ref 3.6–11.0)

## 2017-04-13 LAB — BASIC METABOLIC PANEL
ANION GAP: 9 (ref 5–15)
BUN: 31 mg/dL — ABNORMAL HIGH (ref 6–20)
CO2: 25 mmol/L (ref 22–32)
Calcium: 8.7 mg/dL — ABNORMAL LOW (ref 8.9–10.3)
Chloride: 103 mmol/L (ref 101–111)
Creatinine, Ser: 1.57 mg/dL — ABNORMAL HIGH (ref 0.44–1.00)
GFR calc Af Amer: 39 mL/min — ABNORMAL LOW (ref >60)
GFR, EST NON AFRICAN AMERICAN: 33 mL/min — AB (ref >60)
Glucose, Bld: 170 mg/dL — ABNORMAL HIGH (ref 65–99)
POTASSIUM: 4.7 mmol/L (ref 3.5–5.1)
SODIUM: 137 mmol/L (ref 135–145)

## 2017-04-13 LAB — URINE CULTURE

## 2017-04-13 LAB — LACTIC ACID, PLASMA
LACTIC ACID, VENOUS: 1.4 mmol/L (ref 0.5–1.9)
LACTIC ACID, VENOUS: 2.1 mmol/L — AB (ref 0.5–1.9)

## 2017-04-13 LAB — GLUCOSE, CAPILLARY: GLUCOSE-CAPILLARY: 169 mg/dL — AB (ref 65–99)

## 2017-04-13 MED ORDER — SODIUM CHLORIDE 0.9 % IV BOLUS (SEPSIS)
1000.0000 mL | Freq: Once | INTRAVENOUS | Status: AC
  Administered 2017-04-13: 1000 mL via INTRAVENOUS

## 2017-04-13 NOTE — ED Notes (Signed)
Pt ambulated to bathroom with no assistance.  

## 2017-04-13 NOTE — ED Notes (Signed)
Pt placed on 2L of o2 with approval of Rn Ena Dawley), this tech noticed pt o2 sat dropping to 87 when pt is in a deep sleep.

## 2017-04-13 NOTE — Telephone Encounter (Signed)
Contacted [facility] to cancel procedure.

## 2017-04-13 NOTE — ED Notes (Signed)
Pt given ice.  

## 2017-04-13 NOTE — ED Notes (Signed)
Report to Seth Bake at [facility] on way.

## 2017-04-13 NOTE — ED Provider Notes (Signed)
Repeat lactic normal. AVSS. Well appearing, appropriate for transfer with abx rx    Lavonia Drafts, MD 04/13/17 [phone]

## 2017-04-13 NOTE — Telephone Encounter (Signed)
Please cancel Gina [name]'s procedure on 04/14/17 as she is in the hospital. She will call and reschedule later.

## 2017-04-13 NOTE — ED Notes (Signed)
Pt given breakfast tray

## 2017-04-13 NOTE — ED Notes (Signed)
Pt lying in bed on monitor with water. Pt comfortable at this time.

## 2017-04-13 NOTE — ED Notes (Signed)
CRITICAL LAB: LACTIC is 2.1, [facility] Lab, Dr. Beather Arbour notified, orders received

## 2017-04-13 NOTE — ED Notes (Signed)
Transported to [facility] in [place] with sheriff with paperwork.

## 2017-04-13 NOTE — ED Notes (Signed)
Pt belongings (bag 1 of 1) retrieved from [facility] locker room and given to pt at discharge

## 2017-04-14 ENCOUNTER — Encounter: Admission: RE | Payer: Self-pay | Source: Ambulatory Visit

## 2017-04-14 ENCOUNTER — Ambulatory Visit: Admission: RE | Admit: 2017-04-14 | Payer: Medicare Other | Source: Ambulatory Visit | Admitting: Gastroenterology

## 2017-04-14 SURGERY — COLONOSCOPY WITH PROPOFOL
Anesthesia: General

## 2017-04-15 ENCOUNTER — Other Ambulatory Visit: Payer: Self-pay | Admitting: Family Medicine

## 2017-04-15 ENCOUNTER — Other Ambulatory Visit: Payer: Medicare Other

## 2017-04-15 DIAGNOSIS — N183 Chronic kidney disease, stage 3 unspecified: Secondary | ICD-10-CM

## 2017-04-15 DIAGNOSIS — R7989 Other specified abnormal findings of blood chemistry: Secondary | ICD-10-CM

## 2017-04-15 DIAGNOSIS — E1121 Type 2 diabetes mellitus with diabetic nephropathy: Secondary | ICD-10-CM

## 2017-04-15 NOTE — BH Assessment (Signed)
Pt's ACT team Nira Retort [phone]) called for pt's whereabouts. Informed pt is at [facility].

## 2017-04-18 LAB — CULTURE, BLOOD (ROUTINE X 2)
CULTURE: NO GROWTH
Culture: NO GROWTH
SPECIAL REQUESTS: ADEQUATE
SPECIAL REQUESTS: ADEQUATE

## 2017-04-27 ENCOUNTER — Telehealth: Payer: Self-pay

## 2017-04-27 MED ORDER — NYSTATIN 100000 UNIT/GM EX POWD: Freq: Three times a day (TID) | CUTANEOUS | 1 refills | Status: DC

## 2017-04-27 NOTE — Telephone Encounter (Signed)
May try miralax capful twice daily as long as no abdominal pain.  Refilled nystatin powder.

## 2017-04-27 NOTE — Telephone Encounter (Signed)
Pt left v/m requesting refill of powder given to pt previously for burning and rawness under rt breast. Pt does not want to wake up her sister and request cb. I spoke with pt and she is not sure of name of powder; pt has tried the miralax but for the last 2 days pt has not had BM; pt wants to know what she should take now for constipation. Pt request cb after Dr Darnell Level reviews. Last seen 02/26/17. [employer].

## 2017-04-29 DIAGNOSIS — G4733 Obstructive sleep apnea (adult) (pediatric): Secondary | ICD-10-CM | POA: Diagnosis not present

## 2017-04-29 NOTE — Telephone Encounter (Signed)
Spoke with pt, relayed message per Dr. Darnell Level. Says ok.

## 2017-04-30 ENCOUNTER — Telehealth: Payer: Self-pay | Admitting: Family Medicine

## 2017-04-30 DIAGNOSIS — E1121 Type 2 diabetes mellitus with diabetic nephropathy: Secondary | ICD-10-CM

## 2017-04-30 DIAGNOSIS — IMO0001 Reserved for inherently not codable concepts without codable children: Secondary | ICD-10-CM

## 2017-04-30 NOTE — Telephone Encounter (Signed)
[facility] Patient Name: Gina [name] A DOB: Aug 25, 1950 Initial Comment Caller state she needs to know if she can have prunes and oranges because she is diabetic. Nurse Assessment Nurse: Markus Daft, RN, Sherre Poot Date/Time (Eastern Time): 04/30/2017 1:22:09 PM Confirm and document reason for call. If symptomatic, describe symptoms. ---Caller state she needs to know if she can have prunes and oranges because she is diabetic, not on insulin. No S/S. Does the patient have any new or worsening symptoms? ---No Please document clinical information provided and list any resource used. ---RN advised that she can have fruit but in moderation. Avoid grapes (1 cup serving is 16 grams). F/u visit is due in October. - "Fruits are rich in fiber, vitamins, minerals and antioxidants, all of which make them part of a healthy diet. At the end of the day, as long as you pay appropriate attention to portion sizes of the fruits you eat, there's no reason you can't enjoy them on a daily basis. Most will have only a moderate effect on your blood sugar." Caller verbalized understanding. https://healthyeating.sfgate.com/ fruits-avoid-trying-lower-blood-sugar-3420.html Guidelines Guideline Title Affirmed Question Affirmed Notes Final Disposition User Clinical Call Lamar, RN, Sherre Poot Comments Requesting referral for dietician please.

## 2017-05-01 NOTE — Telephone Encounter (Signed)
Nutritionist referral placed per pt request

## 2017-05-01 NOTE — Telephone Encounter (Signed)
Called patient and gave her the phone number and location of the [facility] at [facility]. Referral was sent to them and they will call the patient directly to schedule.

## 2017-05-01 NOTE — Addendum Note (Signed)
Addended by: Ria Bush on: 05/01/2017 07:09 AM   Modules accepted: Orders

## 2017-05-05 ENCOUNTER — Other Ambulatory Visit: Payer: Self-pay

## 2017-05-05 ENCOUNTER — Telehealth: Payer: Self-pay | Admitting: Gastroenterology

## 2017-05-05 ENCOUNTER — Telehealth: Payer: Self-pay | Admitting: Family Medicine

## 2017-05-05 NOTE — Telephone Encounter (Signed)
Spoke with pt about rescheduling her colonoscopy. She was concerned about her recent dx of diabetes. I have advised her to schedule an appt with her PCP and go over the prep for the colon. How often to check her sugars, what to do if they go up or down. Pt stated she was recently discharged from the psychiatric ward and doesn't have a good memory.

## 2017-05-05 NOTE — Telephone Encounter (Signed)
Pt called concerning a colonoscopy referral that was scheduled. She had to cancel due to hospitalization. She would like to be rescheduled but is requesting the test be done while in the hospital due to dietary restrictions. She is requesting a call back.

## 2017-05-05 NOTE — Telephone Encounter (Signed)
Patient stated she is Dr. Dorothey Baseman patient and would like to talk to you.

## 2017-05-06 NOTE — Telephone Encounter (Signed)
Does not need hospitalization for colonoscopy. Can be done like normal. Continue current medicines, would hold metformin on morning of procedure.  Lab Results  Component Value Date   HGBA1C 7.7 (H) 12/31/2016

## 2017-05-06 NOTE — Telephone Encounter (Signed)
If concern for overdose or confusion over meds please schedule OV here to discuss. I still don't think she would qualify for hospitalization for colonoscopy.

## 2017-05-06 NOTE — Telephone Encounter (Signed)
Left message on vm per pt relaying message per Dr. Darnell Level.

## 2017-05-06 NOTE — Telephone Encounter (Signed)
Spoke with pt, then with pt's sister, Lelan Pons (on dpr). Relayed message per Dr. Darnell Level. However, Lelan Pons is stating pt took an overdose of her meds last night saying "she can't cope". So that is why pt is requesting to have colonoscopy done at the hospital.

## 2017-05-06 NOTE — Telephone Encounter (Signed)
Patient said if she doesn't answer, to leave a detailed message on her voice mail.

## 2017-05-11 ENCOUNTER — Other Ambulatory Visit: Payer: Self-pay | Admitting: Family Medicine

## 2017-05-12 ENCOUNTER — Ambulatory Visit: Payer: Self-pay | Admitting: Dietician

## 2017-05-13 ENCOUNTER — Encounter: Payer: Self-pay | Admitting: Family Medicine

## 2017-05-13 ENCOUNTER — Telehealth: Payer: Self-pay | Admitting: Gastroenterology

## 2017-05-13 ENCOUNTER — Ambulatory Visit (INDEPENDENT_AMBULATORY_CARE_PROVIDER_SITE_OTHER): Payer: Medicare Other | Admitting: Family Medicine

## 2017-05-13 VITALS — BP 118/64 | HR 51 | Temp 98.0°F | Ht 62.0 in | Wt 196.5 lb

## 2017-05-13 DIAGNOSIS — F3110 Bipolar disorder, current episode manic without psychotic features, unspecified: Secondary | ICD-10-CM | POA: Diagnosis not present

## 2017-05-13 DIAGNOSIS — Z86010 Personal history of colonic polyps: Secondary | ICD-10-CM | POA: Diagnosis not present

## 2017-05-13 DIAGNOSIS — E1121 Type 2 diabetes mellitus with diabetic nephropathy: Secondary | ICD-10-CM | POA: Diagnosis not present

## 2017-05-13 NOTE — Patient Instructions (Addendum)
Stop cephalexin (keflex).  Medicine list updated. Diabetes is doing great! Keep up the good work.  Ok to hold metformin as recommended by instructions for bowel prep. Follow bowel prep instructions - usually liquid diet the day before colonoscopy.  I do recommend AM or morning procedure.

## 2017-05-13 NOTE — Progress Notes (Signed)
BP 118/64 (BP Location: Left Arm, Patient Position: Sitting, Cuff Size: Normal)   Pulse (!) 51   Temp 98 F (36.7 C) (Oral)   Ht 5\' 2"  (1.575 m)   Wt 196 lb 8 oz (89.1 kg)   SpO2 98%   BMI 35.94 kg/m    CC: discuss colonoscopy Subjective:    Patient ID: Gina [name], female    DOB: November 14, 1950, 66 y.o.   MRN: [id number]  HPI: Gina [name] is a 66 y.o. female presenting on 05/13/2017 for Discuss colonoscopy (Wants to be hospitalized for procedure)   Here with [facility] Nurse.  [facility] stay with [facility], started on keflex for UTI. No end date. Med rec performed. Has continued taking twice daily since 04/21/2017. Sees [name] NP @ psychotherapeutic services in [place].   Predominantly worried about ability to take bowel prep the day before colonoscopy, worried about sugar management peri-procedurally. She did not bring bowel prep instructions.  Lab Results  Component Value Date   HGBA1C 7.7 (H) 12/31/2016    Lab Results  Component Value Date   CHOL 111 12/04/2015   HDL 38 (L) 12/04/2015   LDLCALC 52 12/04/2015   LDLDIRECT 96.0 12/31/2016   TRIG 103 12/04/2015   CHOLHDL 2.9 12/04/2015     Relevant past medical, surgical, family and social history reviewed and updated as indicated. Interim medical history since our last visit reviewed. Allergies and medications reviewed and updated. Outpatient Medications Prior to Visit  Medication Sig Dispense Refill  . divalproex (DEPAKOTE) 500 MG DR tablet Take 1 tablet (500 mg total) by mouth 2 (two) times daily with a meal. 60 tablet 3  . metFORMIN (GLUCOPHAGE) 500 MG tablet Take 1 tablet (500 mg total) by mouth 2 (two) times daily with a meal. 60 tablet 6  . metoprolol tartrate (LOPRESSOR) 25 MG tablet TAKE 1 & 1/2 TABLET BY MOUTH TWICE DAILY WITH FOOD 84 tablet 6  . paliperidone (INVEGA SUSTENNA) 156 MG/ML SUSP injection Inject 156 mg into the muscle every 30 (thirty) days.  Next dose 05/11/2017. Then every month take 234mg     . QUEtiapine (SEROQUEL) 100 MG tablet Take 100 mg by mouth at bedtime.    . docusate sodium (COLACE) 100 MG capsule Take 200 mg by mouth 2 (two) times daily.    Marland Kitchen nystatin (MYCOSTATIN/NYSTOP) powder Apply topically 3 (three) times daily. (Patient not taking: Reported on 05/13/2017) 60 g 1  . polyethylene glycol powder (GLYCOLAX/MIRALAX) powder TAKE 17 GRAMS BY MOUTH TWICE DAILY AS NEEDED FOR MODERATE CONSTIPATION. (Patient not taking: Reported on 05/13/2017) 527 g 3  . colchicine 0.6 MG tablet Take 0.6 mg by mouth daily as needed. Take 1.2mg  on first day, then 1 tablet daily for 10 days prn gout    . lovastatin (MEVACOR) 40 MG tablet TAKE ONE TABLET BY MOUTH AT BEDTIME. (Patient not taking: Reported on 05/13/2017) 28 tablet 6   No facility-administered medications prior to visit.      Per HPI unless specifically indicated in ROS section below Review of Systems     Objective:    BP 118/64 (BP Location: Left Arm, Patient Position: Sitting, Cuff Size: Normal)   Pulse (!) 51   Temp 98 F (36.7 C) (Oral)   Ht 5\' 2"  (1.575 m)   Wt 196 lb 8 oz (89.1 kg)   SpO2 98%   BMI 35.94 kg/m   Wt Readings from Last 3 Encounters:  05/13/17 196 lb 8 oz (89.1  kg)  04/11/17 197 lb (89.4 kg)  04/08/17 197 lb (89.4 kg)    Physical Exam  Constitutional: She appears well-developed and well-nourished. No distress.  Psychiatric: Her behavior is normal. Thought content normal. Her mood appears anxious.  Expansive affect Rapid speech  Nursing note and vitals reviewed.      Assessment & Plan:  Over 25 minutes were spent face-to-face with the patient during this encounter and >50% of that time was spent on counseling and coordination of care  Problem List Items Addressed This Visit    Bipolar I disorder, most recent episode (or current) manic ([place])    Med rec performed. She is now on lamictal taper. Sherry her behavioral health RN will contact us with  current dosing.       Controlled type 2 diabetes mellitus with diabetic nephropathy ([place]) - Primary    Reviewed sugar log she brings. Reviewed goal fasting blood pressures as well as high and low readings. Congratulated on wonderful control to date.       Relevant Medications   losartan (COZAAR) 50 MG tablet   simvastatin (ZOCOR) 20 MG tablet   Personal history of colonic adenomas and colon cancer    Discussed no current indication or need for hospitalization. Reviewed in detail anticipated course of colonoscopy as well as anticipated bowel prep course, and recommended clear liquid diet prior to colonoscopy, advised to follow bowel prep instructions that will be provided. I do recommend am colonoscopy given diabetes history. Questions answered to the best of my ability.           Follow up plan: Return if symptoms worsen or fail to improve.  Ria Bush, MD

## 2017-05-13 NOTE — Telephone Encounter (Signed)
Patient states she is ready to reschedule her colon. Please see phone note 10.2.18 and advise on scheduling.

## 2017-05-14 NOTE — Assessment & Plan Note (Signed)
Med rec performed. She is now on lamictal taper. Sherry her behavioral health RN will contact us with current dosing.

## 2017-05-14 NOTE — Assessment & Plan Note (Signed)
Reviewed sugar log she brings. Reviewed goal fasting blood pressures as well as high and low readings. Congratulated on wonderful control to date.

## 2017-05-14 NOTE — Assessment & Plan Note (Signed)
Discussed no current indication or need for hospitalization. Reviewed in detail anticipated course of colonoscopy as well as anticipated bowel prep course, and recommended clear liquid diet prior to colonoscopy, advised to follow bowel prep instructions that will be provided. I do recommend am colonoscopy given diabetes history. Questions answered to the best of my ability.

## 2017-05-15 ENCOUNTER — Encounter: Payer: Medicare Other | Attending: Family Medicine | Admitting: *Deleted

## 2017-05-15 ENCOUNTER — Encounter: Payer: Self-pay | Admitting: *Deleted

## 2017-05-15 VITALS — BP 124/70 | Ht 62.0 in | Wt 199.1 lb

## 2017-05-15 DIAGNOSIS — E119 Type 2 diabetes mellitus without complications: Secondary | ICD-10-CM

## 2017-05-15 DIAGNOSIS — E1121 Type 2 diabetes mellitus with diabetic nephropathy: Secondary | ICD-10-CM | POA: Insufficient documentation

## 2017-05-15 DIAGNOSIS — Z713 Dietary counseling and surveillance: Secondary | ICD-10-CM | POA: Diagnosis not present

## 2017-05-15 DIAGNOSIS — E669 Obesity, unspecified: Secondary | ICD-10-CM | POA: Insufficient documentation

## 2017-05-15 NOTE — Progress Notes (Signed)
Diabetes Self-Management Education  Visit Type: First/Initial  Appt. Start Time: 1035 Appt. End Time: 11:55  05/15/2017  Gina [name], identified by name and date of birth, is a 66 y.o. female with a diagnosis of Diabetes: Type 2.   ASSESSMENT  Blood pressure 124/70, height 5' 2"  (1.575 m), weight 199 lb 1.6 oz (90.3 kg). Body mass index is 36.42 kg/m.      Diabetes Self-Management Education - 05/15/17 1234      Visit Information   Visit Type First/Initial     Initial Visit   Diabetes Type Type 2   Are you currently following a meal plan? No   Are you taking your medications as prescribed? Yes   Date Diagnosed 1 year ago     Health Coping   How would you rate your overall health? Fair     Psychosocial Assessment   Patient Belief/Attitude about Diabetes Other (comment)  feels "emotional"   Self-care barriers None   Self-management support Doctor's office;Family   Patient Concerns Nutrition/Meal planning;Medication;Weight Control;Glycemic Control;Healthy Lifestyle   Special Needs Simplified materials   Preferred Learning Style Auditory   Learning Readiness Ready   How often do you need to have someone help you when you read instructions, pamphlets, or other written materials from your doctor or pharmacy? 2 - Rarely   What is the last grade level you completed in school? GED     Pre-Education Assessment   Patient understands the diabetes disease and treatment process. Needs Review   Patient understands incorporating nutritional management into lifestyle. Needs Instruction   Patient undertands incorporating physical activity into lifestyle. Needs Review   Patient understands using medications safely. Needs Review   Patient understands monitoring blood glucose, interpreting and using results Needs Review   Patient understands prevention, detection, and treatment of acute complications. Needs Instruction   Patient understands prevention, detection, and treatment  of chronic complications. Needs Review   Patient understands how to develop strategies to address psychosocial issues. Needs Instruction   Patient understands how to develop strategies to promote health/change behavior. Needs Instruction     Complications   Last HgB A1C per patient/outside source 7.7 %  12/31/16   How often do you check your blood sugar? 1-2 times/day   Fasting Blood glucose range (mg/dL) 70-129  FBG's 94-130 mg/dL   Have you had a dilated eye exam in the past 12 months? Yes   Have you had a dental exam in the past 12 months? No  appt scheduled for 10/23   Are you checking your feet? Yes   How many days per week are you checking your feet? 4     Dietary Intake   Breakfast cheerios or wheaties with milk and applesauce and banana   Lunch skips    Snack (afternoon) chips, orange, oatmeal cookie   Dinner sister cooks - chicken, pork chop, 1-3 hot dogs and bread, spaghetti, chili beans, frozen pizza, potoates, corn, peas, green beans, broccoli, lettuce   Snack (evening) orange, oatmeal cookie   Beverage(s) water     Exercise   Exercise Type Light (walking / raking leaves)   How many days per week to you exercise? 1   How many minutes per day do you exercise? 60   Total minutes per week of exercise 60     Patient Education   Previous Diabetes Education Yes (please comment)  Diabetes classes last year - here   Disease state  Explored patient's options for treatment of their diabetes  Nutrition management  Role of diet in the treatment of diabetes and the relationship between the three main macronutrients and blood glucose level;Reviewed blood glucose goals for pre and post meals and how to evaluate the patients' food intake on their blood glucose level.   Physical activity and exercise  Role of exercise on diabetes management, blood pressure control and cardiac health.   Medications Reviewed patients medication for diabetes, action, purpose, timing of dose and side  effects.   Monitoring Purpose and frequency of SMBG.;Taught/discussed recording of test results and interpretation of SMBG.;Identified appropriate SMBG and/or A1C goals.   Chronic complications Relationship between chronic complications and blood glucose control   Psychosocial adjustment Identified and addressed patients feelings and concerns about diabetes     Individualized Goals (developed by patient)   Reducing Risk Improve blood sugars Decrease medications Lose weight Lead a healthier lifestyle Become more fit     Outcomes   Expected Outcomes Demonstrated interest in learning. Expect minimal changes.       Individualized Plan for Diabetes Self-Management Training:   Learning Objective:  Patient will have a greater understanding of diabetes self-management. Patient education plan is to attend individual and/or group sessions per assessed needs and concerns.   Plan:   Patient Instructions  Check blood sugars 1 x day before breakfast or 2 hrs after supper every day Bring blood sugar records to the next appointment Exercise: Continue leg and arm exercises as tolerated  Eat 3 meals day,  1-2 snacks a day Space meals 4-6 hours apart Don't skip meals Allow 2-3 hours between meals and snacks Include a serving of protein with breakfast Limit desserts/sweets (oatmeal cookie) Eat 1 fruit serving at a time Call back to schedule an appointment with the dietitian   Expected Outcomes:  Demonstrated interest in learning. Expect minimal changes.   Education material provided:  General Meal Planning Guidelines Simple Meal Plan  If problems or questions, patient to contact team via:  Johny Drilling, RN, CCM, CDE [phone]  Future DSME appointment:  Pt reports she needs to schedule her colonoscopy before her next appointment with the dietitian. Instructed her to call back.

## 2017-05-15 NOTE — Patient Instructions (Addendum)
Check blood sugars 1 x day before breakfast or 2 hrs after supper every day Bring blood sugar records to the next appointment  Exercise: Continue leg and arm exercises as tolerated   Eat 3 meals day,  1-2 snacks a day Space meals 4-6 hours apart Don't skip meals Allow 2-3 hours between meals and snacks  Include a serving of protein with breakfast Limit desserts/sweets (oatmeal cookie) Eat 1 fruit serving at a time  Call back to schedule an appointment with the dietitian

## 2017-05-22 ENCOUNTER — Telehealth: Payer: Self-pay | Admitting: Gastroenterology

## 2017-05-22 NOTE — Telephone Encounter (Signed)
Please call [facility] is calling to r/s her colonoscopy. Please call # [phone].

## 2017-05-25 ENCOUNTER — Other Ambulatory Visit: Payer: Self-pay

## 2017-05-25 DIAGNOSIS — Z1211 Encounter for screening for malignant neoplasm of colon: Secondary | ICD-10-CM

## 2017-05-25 NOTE — Telephone Encounter (Signed)
Pt has been rescheduled to 06/09/17 at [facility] with Dr. [name]. Referral has been updated.

## 2017-05-29 ENCOUNTER — Other Ambulatory Visit: Payer: Self-pay | Admitting: Family Medicine

## 2017-05-29 ENCOUNTER — Ambulatory Visit: Payer: Medicare Other | Admitting: Family Medicine

## 2017-05-29 NOTE — Telephone Encounter (Signed)
Pt has appt Mon, 06/01/17

## 2017-06-01 ENCOUNTER — Encounter: Payer: Self-pay | Admitting: Family Medicine

## 2017-06-01 ENCOUNTER — Ambulatory Visit (INDEPENDENT_AMBULATORY_CARE_PROVIDER_SITE_OTHER): Payer: Medicare Other | Admitting: Family Medicine

## 2017-06-01 VITALS — BP 124/84 | HR 56 | Temp 97.8°F | Wt 197.0 lb

## 2017-06-01 DIAGNOSIS — E1121 Type 2 diabetes mellitus with diabetic nephropathy: Secondary | ICD-10-CM | POA: Diagnosis not present

## 2017-06-01 DIAGNOSIS — R7989 Other specified abnormal findings of blood chemistry: Secondary | ICD-10-CM | POA: Diagnosis not present

## 2017-06-01 DIAGNOSIS — N183 Chronic kidney disease, stage 3 unspecified: Secondary | ICD-10-CM

## 2017-06-01 DIAGNOSIS — M25562 Pain in left knee: Secondary | ICD-10-CM

## 2017-06-01 DIAGNOSIS — I10 Essential (primary) hypertension: Secondary | ICD-10-CM

## 2017-06-01 DIAGNOSIS — E785 Hyperlipidemia, unspecified: Secondary | ICD-10-CM

## 2017-06-01 DIAGNOSIS — Z8551 Personal history of malignant neoplasm of bladder: Secondary | ICD-10-CM | POA: Diagnosis not present

## 2017-06-01 LAB — RENAL FUNCTION PANEL
ALBUMIN: 4.2 g/dL (ref 3.5–5.2)
BUN: 27 mg/dL — AB (ref 6–23)
CALCIUM: 9.8 mg/dL (ref 8.4–10.5)
CO2: 28 mEq/L (ref 19–32)
CREATININE: 1.51 mg/dL — AB (ref 0.40–1.20)
Chloride: 103 mEq/L (ref 96–112)
GFR: 36.54 mL/min — ABNORMAL LOW (ref >60.00)
GLUCOSE: 124 mg/dL — AB (ref 70–99)
Phosphorus: 4.6 mg/dL (ref 2.3–4.6)
Potassium: 4.6 mEq/L (ref 3.5–5.1)
SODIUM: 140 meq/L (ref 135–145)

## 2017-06-01 LAB — LIPID PANEL
Cholesterol: 154 mg/dL (ref 0–200)
HDL: 40.6 mg/dL (ref >39.00)
NONHDL: 113.17
Total CHOL/HDL Ratio: 4
Triglycerides: 227 mg/dL — ABNORMAL HIGH (ref 0.0–149.0)
VLDL: 45.4 mg/dL — ABNORMAL HIGH (ref 0.0–40.0)

## 2017-06-01 LAB — HEMOGLOBIN A1C: HEMOGLOBIN A1C: 6 % (ref 4.6–6.5)

## 2017-06-01 LAB — T4, FREE: FREE T4: 0.64 ng/dL (ref 0.60–1.60)

## 2017-06-01 LAB — LDL CHOLESTEROL, DIRECT: Direct LDL: 82 mg/dL

## 2017-06-01 LAB — TSH: TSH: 6.31 u[IU]/mL — AB (ref 0.35–4.50)

## 2017-06-01 NOTE — Progress Notes (Signed)
BP 124/84 (BP Location: Left Arm, Patient Position: Sitting, Cuff Size: Normal)   Pulse (!) 56   Temp 97.8 F (36.6 C) (Oral)   Wt 197 lb (89.4 kg)   SpO2 98%   BMI 36.03 kg/m    CC: f/u visit Subjective:    Patient ID: Gina [name], female    DOB: Aug 27, 1950, 66 y.o.   MRN: [id number]  HPI: Gina [name] is a 66 y.o. female presenting on 06/01/2017 for Follow-up   Here with Antony Madura her mental health nurse.   See prior note for details. She brings log of blood sugars showing fasting low 100s, post dinner 100-150s.   Ongoing L knee pain worse with bending. She is using knee brace which helps. She is not using heating pad or ice.   Upcoming colonoscopy 06/2017 with Dr [name]. She did see nutritionist earlier this month, note reviewed. Planned f/u - will call for appt after colonoscopy.   Lab Results  Component Value Date   HGBA1C 7.7 (H) 12/31/2016    Relevant past medical, surgical, family and social history reviewed and updated as indicated. Interim medical history since our last visit reviewed. Allergies and medications reviewed and updated. Outpatient Medications Prior to Visit  Medication Sig Dispense Refill  . divalproex (DEPAKOTE) 500 MG DR tablet Take 1 tablet (500 mg total) by mouth 2 (two) times daily with a meal. 60 tablet 3  . lamoTRIgine (LAMICTAL) 25 MG tablet Take 25 mg by mouth daily.    Marland Kitchen losartan (COZAAR) 50 MG tablet TAKE 1 TABLET BY MOUTH ONCE DAILY. 30 tablet 0  . Melatonin 3 MG SUBL Place 1 tablet under the tongue at bedtime.    . metFORMIN (GLUCOPHAGE) 500 MG tablet Take 1 tablet (500 mg total) by mouth 2 (two) times daily with a meal. 60 tablet 6  . metoprolol tartrate (LOPRESSOR) 25 MG tablet TAKE 1 & 1/2 TABLET BY MOUTH TWICE DAILY WITH FOOD 84 tablet 6  . nystatin (MYCOSTATIN/NYSTOP) powder Apply topically 3 (three) times daily. (Patient taking differently: Apply topically 3 (three) times daily as needed. ) 60 g 1  . paliperidone  (INVEGA SUSTENNA) 234 MG/1.5ML SUSP injection Inject 234 mg into the muscle every 30 (thirty) days.    . polyethylene glycol powder (GLYCOLAX/MIRALAX) powder TAKE 17 GRAMS BY MOUTH TWICE DAILY AS NEEDED FOR MODERATE CONSTIPATION. 527 g 3  . QUEtiapine (SEROQUEL) 200 MG tablet Take 200 mg by mouth at bedtime.    . simvastatin (ZOCOR) 20 MG tablet TAKE ONE TABLET BY MOUTH AT BEDTIME. 30 tablet 0   No facility-administered medications prior to visit.      Per HPI unless specifically indicated in ROS section below Review of Systems     Objective:    BP 124/84 (BP Location: Left Arm, Patient Position: Sitting, Cuff Size: Normal)   Pulse (!) 56   Temp 97.8 F (36.6 C) (Oral)   Wt 197 lb (89.4 kg)   SpO2 98%   BMI 36.03 kg/m   Wt Readings from Last 3 Encounters:  06/01/17 197 lb (89.4 kg)  05/15/17 199 lb 1.6 oz (90.3 kg)  05/13/17 196 lb 8 oz (89.1 kg)    Physical Exam  Constitutional: She appears well-developed and well-nourished. No distress.  HENT:  Mouth/Throat: Oropharynx is clear and moist. No oropharyngeal exudate.  Eyes: Pupils are equal, round, and reactive to light. Conjunctivae and EOM are normal.  Cardiovascular: Normal rate, regular rhythm, normal heart sounds and intact distal pulses.  No murmur heard. Pulmonary/Chest: Effort normal and breath sounds normal. No respiratory distress. She has no wheezes. She has no rales.  Abdominal: Soft. Bowel sounds are normal. She exhibits distension (mild). She exhibits no mass. There is no hepatosplenomegaly. There is no tenderness. There is no rebound and no guarding. A hernia (incisional) is present.  Several incisional hernias at abdomen  Musculoskeletal: She exhibits no edema.  Bilateral knees with crepitus with extension. Pain at L pes anserine bursa No popliteal fullness  Skin: Skin is warm and dry. No rash noted.  Psychiatric: She has a normal mood and affect.  Nursing note and vitals reviewed.     Assessment & Plan:     Problem List Items Addressed This Visit    Abnormal TSH    ?subclinical hypothyroidism - update TFTs      CKD (chronic kidney disease) stage 3, GFR 30-59 ml/min (HCC)    Update Cr.       Controlled type 2 diabetes mellitus with diabetic nephropathy ([place]) - Primary    Reviewed sugar log she brings. Update labs today.  Continue metformin, check Cr      Relevant Orders   Hemoglobin A1c   Renal function panel   History of bladder cancer    Pt used bathroom prior to getting UA - will try to get sample next time      HTN (hypertension), benign    Chronic, stable. Continue current regimen.       Hyperlipidemia    Update FLP on simvastatin.       Relevant Orders   Lipid panel   Left knee pain    Anticipate pes anserine bursitis - provided with exercises from SM pt advisor, discussed tylenol and heating pad and knee brace use.           Follow up plan: Return in about 1 month (around 07/02/2017) for follow up visit, medicare wellness visit.  Ria Bush, MD

## 2017-06-01 NOTE — Assessment & Plan Note (Signed)
Update Cr.  

## 2017-06-01 NOTE — Assessment & Plan Note (Signed)
Chronic, stable. Continue current regimen. 

## 2017-06-01 NOTE — Assessment & Plan Note (Signed)
Anticipate pes anserine bursitis - provided with exercises from [facility] pt advisor, discussed tylenol and heating pad and knee brace use.

## 2017-06-01 NOTE — Patient Instructions (Addendum)
Labs today.  For left knee - I think you have arthritis but also bursitis of the knee - do exercises provided today. May use heating pad to area as well as tylenol as needed for pain.  We will await colonoscopy results.  Continue metformin, continue checking sugars like you're doing.  Return in 1-2 months for medicare wellness visit with Katha Cabal and f/u with me

## 2017-06-01 NOTE — Assessment & Plan Note (Signed)
?  subclinical hypothyroidism - update TFTs

## 2017-06-01 NOTE — Assessment & Plan Note (Addendum)
Update FLP on simvastatin.

## 2017-06-01 NOTE — Addendum Note (Signed)
Addended by: Ellamae Sia on: 06/01/2017 11:29 AM   Modules accepted: Orders

## 2017-06-01 NOTE — Assessment & Plan Note (Addendum)
Pt used bathroom prior to getting UA - will try to get sample next time

## 2017-06-01 NOTE — Assessment & Plan Note (Signed)
Reviewed sugar log she brings. Update labs today.  Continue metformin, check Cr

## 2017-06-07 ENCOUNTER — Other Ambulatory Visit: Payer: Self-pay | Admitting: Family Medicine

## 2017-06-07 MED ORDER — LEVOTHYROXINE SODIUM 25 MCG PO TABS: 25.0000 ug | ORAL_TABLET | Freq: Every day | ORAL | 3 refills | Status: DC

## 2017-06-09 ENCOUNTER — Encounter: Payer: Self-pay | Admitting: Anesthesiology

## 2017-06-09 ENCOUNTER — Ambulatory Visit
Admission: RE | Admit: 2017-06-09 | Discharge: 2017-06-09 | Disposition: A | Discharge status: Home / Self Care | Payer: Medicare Other | Source: Ambulatory Visit | Attending: Gastroenterology | Admitting: Gastroenterology

## 2017-06-09 ENCOUNTER — Ambulatory Visit: Payer: Medicare Other | Admitting: Certified Registered Nurse Anesthetist

## 2017-06-09 ENCOUNTER — Encounter
Admission: RE | Disposition: A | Discharge status: Home / Self Care | Payer: Self-pay | Source: Ambulatory Visit | Attending: Gastroenterology

## 2017-06-09 DIAGNOSIS — K5790 Diverticulosis of intestine, part unspecified, without perforation or abscess without bleeding: Secondary | ICD-10-CM | POA: Diagnosis not present

## 2017-06-09 DIAGNOSIS — I4510 Unspecified right bundle-branch block: Secondary | ICD-10-CM | POA: Diagnosis not present

## 2017-06-09 DIAGNOSIS — Z8551 Personal history of malignant neoplasm of bladder: Secondary | ICD-10-CM | POA: Insufficient documentation

## 2017-06-09 DIAGNOSIS — K219 Gastro-esophageal reflux disease without esophagitis: Secondary | ICD-10-CM | POA: Insufficient documentation

## 2017-06-09 DIAGNOSIS — Z85038 Personal history of other malignant neoplasm of large intestine: Secondary | ICD-10-CM | POA: Diagnosis not present

## 2017-06-09 DIAGNOSIS — E1121 Type 2 diabetes mellitus with diabetic nephropathy: Secondary | ICD-10-CM | POA: Diagnosis not present

## 2017-06-09 DIAGNOSIS — I129 Hypertensive chronic kidney disease with stage 1 through stage 4 chronic kidney disease, or unspecified chronic kidney disease: Secondary | ICD-10-CM | POA: Insufficient documentation

## 2017-06-09 DIAGNOSIS — F319 Bipolar disorder, unspecified: Secondary | ICD-10-CM | POA: Diagnosis not present

## 2017-06-09 DIAGNOSIS — Z90710 Acquired absence of both cervix and uterus: Secondary | ICD-10-CM | POA: Insufficient documentation

## 2017-06-09 DIAGNOSIS — K640 First degree hemorrhoids: Secondary | ICD-10-CM | POA: Insufficient documentation

## 2017-06-09 DIAGNOSIS — Z800 Family history of malignant neoplasm of digestive organs: Secondary | ICD-10-CM | POA: Insufficient documentation

## 2017-06-09 DIAGNOSIS — K5730 Diverticulosis of large intestine without perforation or abscess without bleeding: Secondary | ICD-10-CM | POA: Insufficient documentation

## 2017-06-09 DIAGNOSIS — Z1211 Encounter for screening for malignant neoplasm of colon: Secondary | ICD-10-CM | POA: Insufficient documentation

## 2017-06-09 DIAGNOSIS — E1122 Type 2 diabetes mellitus with diabetic chronic kidney disease: Secondary | ICD-10-CM | POA: Insufficient documentation

## 2017-06-09 DIAGNOSIS — N183 Chronic kidney disease, stage 3 (moderate): Secondary | ICD-10-CM | POA: Insufficient documentation

## 2017-06-09 DIAGNOSIS — Z7984 Long term (current) use of oral hypoglycemic drugs: Secondary | ICD-10-CM | POA: Diagnosis not present

## 2017-06-09 DIAGNOSIS — D122 Benign neoplasm of ascending colon: Secondary | ICD-10-CM | POA: Insufficient documentation

## 2017-06-09 DIAGNOSIS — M1990 Unspecified osteoarthritis, unspecified site: Secondary | ICD-10-CM | POA: Diagnosis not present

## 2017-06-09 DIAGNOSIS — K635 Polyp of colon: Secondary | ICD-10-CM | POA: Diagnosis not present

## 2017-06-09 DIAGNOSIS — E669 Obesity, unspecified: Secondary | ICD-10-CM | POA: Diagnosis not present

## 2017-06-09 DIAGNOSIS — I10 Essential (primary) hypertension: Secondary | ICD-10-CM | POA: Diagnosis not present

## 2017-06-09 DIAGNOSIS — Z888 Allergy status to other drugs, medicaments and biological substances status: Secondary | ICD-10-CM | POA: Insufficient documentation

## 2017-06-09 DIAGNOSIS — Z79899 Other long term (current) drug therapy: Secondary | ICD-10-CM | POA: Diagnosis not present

## 2017-06-09 DIAGNOSIS — Z8542 Personal history of malignant neoplasm of other parts of uterus: Secondary | ICD-10-CM | POA: Diagnosis not present

## 2017-06-09 DIAGNOSIS — Z86010 Personal history of colonic polyps: Secondary | ICD-10-CM | POA: Insufficient documentation

## 2017-06-09 DIAGNOSIS — E785 Hyperlipidemia, unspecified: Secondary | ICD-10-CM | POA: Insufficient documentation

## 2017-06-09 DIAGNOSIS — Z887 Allergy status to serum and vaccine status: Secondary | ICD-10-CM | POA: Insufficient documentation

## 2017-06-09 DIAGNOSIS — G4733 Obstructive sleep apnea (adult) (pediatric): Secondary | ICD-10-CM | POA: Diagnosis not present

## 2017-06-09 DIAGNOSIS — Z6836 Body mass index (BMI) 36.0-36.9, adult: Secondary | ICD-10-CM | POA: Diagnosis not present

## 2017-06-09 DIAGNOSIS — Z880 Allergy status to penicillin: Secondary | ICD-10-CM | POA: Insufficient documentation

## 2017-06-09 DIAGNOSIS — K648 Other hemorrhoids: Secondary | ICD-10-CM | POA: Diagnosis not present

## 2017-06-09 DIAGNOSIS — Z91041 Radiographic dye allergy status: Secondary | ICD-10-CM | POA: Insufficient documentation

## 2017-06-09 DIAGNOSIS — Z8249 Family history of ischemic heart disease and other diseases of the circulatory system: Secondary | ICD-10-CM | POA: Insufficient documentation

## 2017-06-09 DIAGNOSIS — Z9989 Dependence on other enabling machines and devices: Secondary | ICD-10-CM | POA: Insufficient documentation

## 2017-06-09 DIAGNOSIS — E119 Type 2 diabetes mellitus without complications: Secondary | ICD-10-CM | POA: Diagnosis not present

## 2017-06-09 HISTORY — PX: COLONOSCOPY WITH PROPOFOL: SHX5780

## 2017-06-09 LAB — GLUCOSE, CAPILLARY: GLUCOSE-CAPILLARY: 108 mg/dL — AB (ref 65–99)

## 2017-06-09 SURGERY — COLONOSCOPY WITH PROPOFOL
Anesthesia: General

## 2017-06-09 MED ORDER — EPHEDRINE SULFATE 50 MG/ML IJ SOLN
INTRAMUSCULAR | Status: DC | PRN
  Administered 2017-06-09: 10 mg via INTRAVENOUS
  Administered 2017-06-09: 5 mg via INTRAVENOUS
  Administered 2017-06-09: 10 mg via INTRAVENOUS

## 2017-06-09 MED ORDER — PROPOFOL 10 MG/ML IV BOLUS
INTRAVENOUS | Status: AC
  Filled 2017-06-09: qty 60

## 2017-06-09 MED ORDER — LIDOCAINE HCL (CARDIAC) 20 MG/ML IV SOLN
INTRAVENOUS | Status: DC | PRN
  Administered 2017-06-09: 80 mg via INTRAVENOUS

## 2017-06-09 MED ORDER — PROPOFOL 500 MG/50ML IV EMUL
INTRAVENOUS | Status: AC
  Filled 2017-06-09: qty 50

## 2017-06-09 MED ORDER — LIDOCAINE HCL (PF) 1 % IJ SOLN
INTRAMUSCULAR | Status: AC
  Administered 2017-06-09: 0.3 mL via INTRADERMAL
  Filled 2017-06-09: qty 2

## 2017-06-09 MED ORDER — LIDOCAINE HCL (PF) 1 % IJ SOLN
2.0000 mL | Freq: Once | INTRAMUSCULAR | Status: AC
  Administered 2017-06-09: 0.3 mL via INTRADERMAL

## 2017-06-09 MED ORDER — PROPOFOL 10 MG/ML IV BOLUS
INTRAVENOUS | Status: DC | PRN
  Administered 2017-06-09: 40 mg via INTRAVENOUS

## 2017-06-09 MED ORDER — SODIUM CHLORIDE 0.9 % IV SOLN
INTRAVENOUS | Status: DC
  Administered 2017-06-09: 1000 mL via INTRAVENOUS
  Administered 2017-06-09: 09:00:00 via INTRAVENOUS

## 2017-06-09 MED ORDER — PROPOFOL 500 MG/50ML IV EMUL
INTRAVENOUS | Status: DC | PRN
  Administered 2017-06-09: 80 ug/kg/min via INTRAVENOUS

## 2017-06-09 NOTE — Anesthesia Post-op Follow-up Note (Signed)
Anesthesia QCDR form completed.        

## 2017-06-09 NOTE — Anesthesia Postprocedure Evaluation (Signed)
Anesthesia Post Note  Patient: Gina [name]  Procedure(s) Performed: COLONOSCOPY WITH PROPOFOL (N/A )  Patient location during evaluation: Endoscopy Anesthesia Type: General Level of consciousness: awake and alert Pain management: pain level controlled Vital Signs Assessment: post-procedure vital signs reviewed and stable Respiratory status: spontaneous breathing, nonlabored ventilation, respiratory function stable and patient connected to nasal cannula oxygen Cardiovascular status: blood pressure returned to baseline and stable Postop Assessment: no apparent nausea or vomiting Anesthetic complications: no     Last Vitals:  Vitals:   06/09/17 0940 06/09/17 0950  BP: 126/71 (!) 141/63  Pulse: 68 67  Resp: 20 19  Temp:    SpO2: 100% 98%    Last Pain:  Vitals:   06/09/17 0927  TempSrc: Tympanic                 Precious Haws Marcela Alatorre

## 2017-06-09 NOTE — Transfer of Care (Signed)
Immediate Anesthesia Transfer of Care Note  Patient: Gina [name]  Procedure(s) Performed: COLONOSCOPY WITH PROPOFOL (N/A )  Patient Location: PACU and Endoscopy Unit  Anesthesia Type:General  Level of Consciousness: awake, alert , oriented and patient cooperative  Airway & Oxygen Therapy: Patient Spontanous Breathing and Patient connected to nasal cannula oxygen  Post-op Assessment: Report given to RN, Post -op Vital signs reviewed and stable and Patient moving all extremities  Post vital signs: Reviewed and stable  Last Vitals:  Vitals:   06/09/17 0805 06/09/17 0927  BP: 127/67 (!) 128/59  Pulse: 65 71  Resp: 17 12  Temp: 36.4 C (!) 36.2 C  SpO2: 100% 100%    Last Pain:  Vitals:   06/09/17 0927  TempSrc: Tympanic         Complications: No apparent anesthesia complications

## 2017-06-09 NOTE — Anesthesia Preprocedure Evaluation (Signed)
Anesthesia Evaluation  Patient identified by MRN, date of birth, ID band Patient awake    Reviewed: Allergy & Precautions, H&P , NPO status , Patient's Chart, lab work & pertinent test results  History of Anesthesia Complications Negative for: history of anesthetic complications  Airway Mallampati: III  TM Distance: <3 FB Neck ROM: limited    Dental  (+) Chipped, Poor Dentition, Missing, Edentulous Upper   Pulmonary neg shortness of breath, sleep apnea ,           Cardiovascular Exercise Tolerance: Good hypertension, (-) angina(-) Past MI and (-) DOE + dysrhythmias      Neuro/Psych PSYCHIATRIC DISORDERS Bipolar Disorder negative neurological ROS     GI/Hepatic GERD  ,(+) Hepatitis -  Endo/Other  diabetes, Type 2  Renal/GU Renal diseasenegative Renal ROS  negative genitourinary   Musculoskeletal  (+) Arthritis ,   Abdominal   Peds  Hematology negative hematology ROS (+)   Anesthesia Other Findings Past Medical History: No date: Anemia in chronic kidney disease No date: Bipolar depression ([place])     Comment:  sees psychiatrist - psych admission 03/2015 1990's: Bladder cancer [facility] 2009: Blood transfusion without reported diagnosis No date: Cataract     Comment:  left No date: CKD (chronic kidney disease) stage 3, GFR 30-59 ml/min (HCC) 1990's: Colon cancer ([place]) No date: DJD (degenerative joint disease), lumbar     Comment:  chronic lower back pain 04/07/2012: Enterocutaneous fistula     Comment:  completed PT 06/2012 ([facility]) No date: Family history of breast cancer No date: Family history of colon cancer No date: Family history of stomach cancer No date: GERD (gastroesophageal reflux disease) 1997: History of bladder cancer No date: History of colon cancer     Comment:  s/p surgery No date: History of uterine cancer     Comment:  s/p hysterectomy No date: Hyperlipidemia No date: Hypertension 01/2013:  IDA (iron deficiency anemia)     Comment:  thought due to h/o polyps No date: Obesity (BMI 30-39.9) No date: OSA on CPAP     Comment:  6cm H2O 11/11/2012: Personal history of colonic adenomas and colon cancer 09/2014: Positive hepatitis C antibody test     Comment:  but negative confirmatory testing No date: RBBB 06/07/2014: Uncontrolled type 2 diabetes mellitus with nephropathy  ([place])     Comment:  completed DSME 02/2016  Past Surgical History: 01/2014: BREAST BIOPSY; Right     Comment:  fibroadenoma 07/2009: COLONOSCOPY 11/2012: COLONOSCOPY     Comment:  2 tubular adenomas, mild diverticulosis, pending genetic              testing for Lynch syndrome [name]) rpt 2 yrs 02/2015: COLONOSCOPY     Comment:  TA, diverticulosis, rpt 2 yrs [name]) 12/2009: DEXA     Comment:  WNL 12/2009: DOBUTAMINE STRESS ECHO     Comment:  no evidence of ischemia 02/04/12: HERNIA REPAIR 02/19/12: i & d abdominal wound 2005: LEFT OOPHORECTOMY about 2008: PARTIAL COLECTOMY     Comment:  for colon cancer 1981: PARTIAL HYSTERECTOMY     Comment:  uterine cancer, R ovary remains 02/26/12: Reexploration of abdominal wound and Allograft placemet 02/24/12: Removal of infected mesh and abdominal wound vac placement 02/2014: sleep study     Comment:  OSA - AHI 55, nadir 81% ([place])  BMI    Body Mass Index:  36.40 kg/m      Reproductive/Obstetrics negative OB ROS  Anesthesia Physical Anesthesia Plan  ASA: III  Anesthesia Plan: General   Post-op Pain Management:    Induction: Intravenous  PONV Risk Score and Plan: 3 and Propofol infusion  Airway Management Planned: Natural Airway and Nasal Cannula  Additional Equipment:   Intra-op Plan:   Post-operative Plan:   Informed Consent: I have reviewed the patients History and Physical, chart, labs and discussed the procedure including the risks, benefits and alternatives for the proposed anesthesia  with the patient or authorized representative who has indicated his/her understanding and acceptance.   Dental Advisory Given  Plan Discussed with: Anesthesiologist, CRNA and Surgeon  Anesthesia Plan Comments: (Patient consented for risks of anesthesia including but not limited to:  - adverse reactions to medications - risk of intubation if required - damage to teeth, lips or other oral mucosa - sore throat or hoarseness - Damage to heart, brain, lungs or loss of life  Patient voiced understanding.)        Anesthesia Quick Evaluation

## 2017-06-09 NOTE — Op Note (Signed)
[facility] Gastroenterology Patient Name: Gina [name] Procedure Date: 06/09/2017 8:54 AM MRN: [id number] Account #: [id number] Date of Birth: [birth year] Admit Type: Outpatient Age: 66 Room: [facility] ENDO ROOM 4 Gender: Female Note Status: Finalized Procedure:            Colonoscopy Indications:          High risk colon cancer surveillance: Personal history                        of colonic polyps, High risk colon cancer surveillance:                        Personal history of colon cancer, Family history of                        anal canal cancer, questionable family history of Lynch                        syndrome Providers:            Lucilla Lame MD, MD Referring MD:         Ria Bush (Referring MD) Medicines:            Propofol per Anesthesia Complications:        No immediate complications. Procedure:            Pre-Anesthesia Assessment:                       - Prior to the procedure, a History and Physical was                        performed, and patient medications and allergies were                        reviewed. The patient's tolerance of previous                        anesthesia was also reviewed. The risks and benefits of                        the procedure and the sedation options and risks were                        discussed with the patient. All questions were                        answered, and informed consent was obtained. Prior                        Anticoagulants: The patient has taken no previous                        anticoagulant or antiplatelet agents. ASA Grade                        Assessment: II - A patient with mild systemic disease.                        After reviewing the risks and benefits, the patient was  deemed in satisfactory condition to undergo the                        procedure.                       After obtaining informed consent, the colonoscope was   passed under direct vision. Throughout the procedure,                        the patient's blood pressure, pulse, and oxygen                        saturations were monitored continuously. The Olympus                        CF-H180AL colonoscope ( S#: [id number] ) was introduced                        through the anus and advanced to the the cecum,                        identified by appendiceal orifice and ileocecal valve.                        The colonoscopy was performed without difficulty. The                        patient tolerated the procedure well. The quality of                        the bowel preparation was excellent. Findings:      The perianal and digital rectal examinations were normal.      A 3 mm polyp was found in the ascending colon. The polyp was sessile.       The polyp was removed with a cold biopsy forceps. Resection and       retrieval were complete.      Multiple small-mouthed diverticula were found in the sigmoid colon.      There was evidence of a prior end-to-end colo-colonic anastomosis in the       descending colon. This was patent.      Non-bleeding internal hemorrhoids were found during retroflexion. The       hemorrhoids were Grade I (internal hemorrhoids that do not prolapse). Impression:           - One 3 mm polyp in the ascending colon, removed with a                        cold biopsy forceps. Resected and retrieved.                       - Diverticulosis in the sigmoid colon.                       - Patent end-to-end colo-colonic anastomosis.                       - Non-bleeding internal hemorrhoids. Recommendation:       - Discharge patient to home.                       -  Resume previous diet.                       - Continue present medications.                       - Await pathology results.                       - Repeat colonoscopy in 2 years for surveillance. Procedure Code(s):    --- Professional ---                       [phone],  Colonoscopy, flexible; with biopsy, single or                        multiple Diagnosis Code(s):    --- Professional ---                       [id number], Personal history of other malignant neoplasm                        of large intestine                       D12.2, Benign neoplasm of ascending colon                       Z80.0, Family history of malignant neoplasm of                        digestive organs CPT copyright 2016 American Medical Association. All rights reserved. The codes documented in this report are preliminary and upon coder review may  be revised to meet current compliance requirements. Lucilla Lame MD, MD 06/09/2017 9:24:12 AM This report has been signed electronically. Number of Addenda: 0 Note Initiated On: 06/09/2017 8:54 AM Scope Withdrawal Time: 0 hours 6 minutes 34 seconds  Total Procedure Duration: 0 hours 13 minutes 42 seconds       [facility]

## 2017-06-09 NOTE — H&P (Signed)
Gina Lame, MD [facility] [address] [place], [place] [address] [phone] Fax : [phone]  Primary Care Physician:  Gina Bush, MD Primary Gastroenterologist:  Dr. [name]  Pre-Procedure History & Physical: HPI:  Gina [name] is a 66 y.o. female is here for an colonoscopy.   Past Medical History:  Diagnosis Date  . Anemia in chronic kidney disease   . Bipolar depression [facility])    sees psychiatrist - psych admission 03/2015  . Bladder cancer ([place]) 1990's  . Blood transfusion without reported diagnosis 2009  . Cataract    left  . CKD (chronic kidney disease) stage 3, GFR 30-59 ml/min (HCC)   . Colon cancer ([place]) 1990's  . DJD (degenerative joint disease), lumbar    chronic lower back pain  . Enterocutaneous fistula 04/07/2012   completed PT 06/2012 ([facility])  . Family history of breast cancer   . Family history of colon cancer   . Family history of stomach cancer   . GERD (gastroesophageal reflux disease)   . History of bladder cancer 1997  . History of colon cancer    s/p surgery  . History of uterine cancer    s/p hysterectomy  . Hyperlipidemia   . Hypertension   . IDA (iron deficiency anemia) 01/2013   thought due to h/o polyps  . Obesity (BMI 30-39.9)   . OSA on CPAP    6cm H2O  . Personal history of colonic adenomas and colon cancer 11/11/2012  . Positive hepatitis C antibody test 09/2014   but negative confirmatory testing  . RBBB   . Uncontrolled type 2 diabetes mellitus with nephropathy ([place]) 06/07/2014   completed DSME 02/2016    Past Surgical History:  Procedure Laterality Date  . BREAST BIOPSY Right 01/2014   fibroadenoma  . COLONOSCOPY  07/2009  . COLONOSCOPY  11/2012   2 tubular adenomas, mild diverticulosis, pending genetic testing for Lynch syndrome Gina [name]) rpt 2 yrs  . COLONOSCOPY  02/2015   TA, diverticulosis, rpt 2 yrs Gina [name])  . DEXA  12/2009   WNL  . DOBUTAMINE STRESS ECHO  12/2009   no evidence of ischemia    . HERNIA REPAIR  02/04/12  . i & d abdominal wound  02/19/12  . LEFT OOPHORECTOMY  2005  . PARTIAL COLECTOMY  about 2008   for colon cancer  . PARTIAL HYSTERECTOMY  1981   uterine cancer, R ovary remains  . Reexploration of abdominal wound and Allograft placemet  02/26/12  . Removal of infected mesh and abdominal wound vac placement  02/24/12  . sleep study  02/2014   OSA - AHI 55, nadir 81% Gina [name])    Prior to Admission medications   Medication Sig Start Date End Date Taking? Authorizing Provider  divalproex (DEPAKOTE) 500 MG DR tablet Take 1 tablet (500 mg total) by mouth 2 (two) times daily with a meal. 01/15/16   Gina Bush, MD  lamoTRIgine (LAMICTAL) 25 MG tablet Take 25 mg by mouth daily.    [provider]  levothyroxine (SYNTHROID, LEVOTHROID) 25 MCG tablet Take 1 tablet (25 mcg total) daily before breakfast by mouth. 06/07/17   Gina Bush, MD  losartan (COZAAR) 50 MG tablet TAKE 1 TABLET BY MOUTH ONCE DAILY. 05/29/17   Gina Bush, MD  Melatonin 3 MG SUBL Place 1 tablet under the tongue at bedtime.    [provider]  metFORMIN (GLUCOPHAGE) 500 MG tablet Take 1 tablet (500 mg total) by mouth 2 (two) times daily with  a meal. 01/03/17   Gina Bush, MD  metoprolol tartrate (LOPRESSOR) 25 MG tablet TAKE 1 & 1/2 TABLET BY MOUTH TWICE DAILY WITH FOOD 02/06/17   Gina Bush, MD  nystatin (MYCOSTATIN/NYSTOP) powder Apply topically 3 (three) times daily. Patient taking differently: Apply topically 3 (three) times daily as needed.  04/27/17   Gina Bush, MD  paliperidone (INVEGA SUSTENNA) 234 MG/1.5ML SUSP injection Inject 234 mg into the muscle every 30 (thirty) days.    [provider]  polyethylene glycol powder (GLYCOLAX/MIRALAX) powder TAKE 17 GRAMS BY MOUTH TWICE DAILY AS NEEDED FOR MODERATE CONSTIPATION. 05/11/17   Gina Bush, MD  QUEtiapine (SEROQUEL) 200 MG tablet Take 200 mg by mouth at bedtime.    [provider]  simvastatin (ZOCOR) 20 MG tablet TAKE ONE TABLET BY MOUTH AT BEDTIME. 05/29/17   Gina Bush, MD    Allergies as of 05/25/2017 - Review Complete 05/15/2017  Allergen Reaction Noted  . Risperidone and related Other (See Comments) 04/12/2015  . Penicillins Other (See Comments) 04/07/2012  . Ivp dye [iodinated diagnostic agents] Rash 05/27/2012  . Tetanus toxoids Rash 04/07/2012  . Zetia [ezetimibe] Rash 08/03/2012    Family History  Problem Relation Age of Onset  . Colon cancer Mother 56  . Stomach cancer Mother        dx in her 81s?  Marland Kitchen Colon cancer Sister 80       Maternal half sister  . CAD Father        MI  . Mental illness Sister        anxiety/depression  . Thyroid disease Sister   . Breast cancer Maternal Grandmother   . Diabetes Brother   . Diabetes Brother   . Diabetes Other        aunts and uncles both sides  . Arthritis Other        strong fmhx  . Esophageal cancer Neg Hx   . Rectal cancer Neg Hx     Social History   Socioeconomic History  . Marital status: Single    Spouse name: Not on file  . Number of children: 0  . Years of education: Not on file  . Highest education level: Not on file  Social Needs  . Financial resource strain: Not on file  . Food insecurity - worry: Not on file  . Food insecurity - inability: Not on file  . Transportation needs - medical: Not on file  . Transportation needs - non-medical: Not on file  Occupational History  . Not on file  Tobacco Use  . Smoking status: Never Smoker  . Smokeless tobacco: Never Used  Substance and Sexual Activity  . Alcohol use: No    Alcohol/week: 0.0 oz  . Drug use: No  . Sexual activity: No  Other Topics Concern  . Not on file  Social History Narrative   Lives with sister, no pets   Occupation: disabled, for bipolar and arthritis   Edu: GED   Activity: take walks   Diet: good water, vegetables daily   Religion: [place], Dr.  Jimmye Norman (ph [phone])    Review of Systems: See HPI, otherwise negative ROS  Physical Exam: BP 127/67   Pulse 65   Temp 97.6 F (36.4 C) (Tympanic)   Resp 17   Ht 5\' 2"  (1.575 m)   Wt 199 lb (90.3 kg)   SpO2 100%   BMI 36.40 kg/m  General:  Alert,  pleasant and cooperative in NAD Head:  Normocephalic and atraumatic. Neck:  Supple; no masses or thyromegaly. Lungs:  Clear throughout to auscultation.    Heart:  Regular rate and rhythm. Abdomen:  Soft, nontender and nondistended. Normal bowel sounds, without guarding, and without rebound.   Neurologic:  Alert and  oriented x4;  grossly normal neurologically.  Impression/Plan: Gina [name] is here for an colonoscopy to be performed for history of colon polyps  Risks, benefits, limitations, and alternatives regarding  colonoscopy have been reviewed with the patient.  Questions have been answered.  All parties agreeable.   Gina Lame, MD  06/09/2017, 9:04 AM

## 2017-06-10 ENCOUNTER — Encounter: Payer: Self-pay | Admitting: Gastroenterology

## 2017-06-11 ENCOUNTER — Encounter: Payer: Self-pay | Admitting: Gastroenterology

## 2017-06-11 LAB — SURGICAL PATHOLOGY

## 2017-06-12 ENCOUNTER — Encounter: Payer: Self-pay | Admitting: Family Medicine

## 2017-06-12 ENCOUNTER — Telehealth: Payer: Self-pay | Admitting: Family Medicine

## 2017-06-12 NOTE — Telephone Encounter (Signed)
Copied from [place] [id number]. Topic: Quick Communication - See Telephone Encounter >> Jun 12, 2017  4:21 PM [name] Graff, NT wrote: CRM for notification. See Telephone encounter for: Patient called and stated that her rx for levothyroxine was sent to the wrong pharmacy. She needs to rx sent to   06/12/17.

## 2017-06-12 NOTE — Telephone Encounter (Signed)
Notified pt to contact the pharmacy she wanted her medications to go to so that the medications could retrieved from the wrong pharmacy.Pt verbalized understanding.

## 2017-06-16 ENCOUNTER — Encounter: Payer: Self-pay | Admitting: *Deleted

## 2017-06-17 ENCOUNTER — Telehealth: Payer: Self-pay

## 2017-06-17 ENCOUNTER — Other Ambulatory Visit: Payer: Medicare Other

## 2017-06-17 DIAGNOSIS — R7989 Other specified abnormal findings of blood chemistry: Secondary | ICD-10-CM | POA: Diagnosis not present

## 2017-06-17 NOTE — Telephone Encounter (Signed)
Placed form in Dr. G's box.  

## 2017-06-17 NOTE — Telephone Encounter (Signed)
Copied from [place] [id number]. Topic: General - Other >> Jun 17, 2017  2:12 PM Boyd Kerbs wrote: Reason for CRM: Shirlee Limerick with [facility] [phone]  faxing prescription for C-pap and supllies with sleep study dr evaluation

## 2017-06-18 LAB — T3: T3, Total: 82 ng/dL (ref 76–181)

## 2017-06-19 NOTE — Telephone Encounter (Signed)
Are you going to fax this for pt

## 2017-06-19 NOTE — Telephone Encounter (Signed)
Forms placed in my out box.  OSA followed by Dr Wallene Huh of [facility] last seen 04/2017. plz fax latest sleep study from 02/23/2014 for one request and fax other requests attn Dr [name].

## 2017-06-22 NOTE — Telephone Encounter (Signed)
Already taken care of per [facility].

## 2017-06-24 DIAGNOSIS — Z79899 Other long term (current) drug therapy: Secondary | ICD-10-CM | POA: Diagnosis not present

## 2017-06-24 LAB — HEPATIC FUNCTION PANEL
ALK PHOS: 80 (ref 25–125)
ALT: 18 (ref 7–35)
AST: 22 (ref 13–35)
Bilirubin, Total: 0.2

## 2017-06-24 LAB — BASIC METABOLIC PANEL
BUN: 28 — AB (ref 4–21)
CREATININE: 1.7 — AB (ref 0.5–1.1)
Glucose: 132
POTASSIUM: 5.4 — AB (ref 3.4–5.3)
Sodium: 142 (ref 137–147)

## 2017-06-24 LAB — CBC AND DIFFERENTIAL
Hemoglobin: 11.4 — AB (ref 12.0–16.0)
PLATELETS: 114 — AB (ref 150–399)
WBC: 7.3

## 2017-07-03 ENCOUNTER — Encounter: Payer: Self-pay | Admitting: Family Medicine

## 2017-07-03 ENCOUNTER — Ambulatory Visit (INDEPENDENT_AMBULATORY_CARE_PROVIDER_SITE_OTHER): Payer: Medicare Other | Admitting: Family Medicine

## 2017-07-03 VITALS — BP 122/70 | HR 60 | Temp 98.0°F | Wt 197.0 lb

## 2017-07-03 DIAGNOSIS — D696 Thrombocytopenia, unspecified: Secondary | ICD-10-CM

## 2017-07-03 DIAGNOSIS — E1121 Type 2 diabetes mellitus with diabetic nephropathy: Secondary | ICD-10-CM | POA: Diagnosis not present

## 2017-07-03 DIAGNOSIS — F3110 Bipolar disorder, current episode manic without psychotic features, unspecified: Secondary | ICD-10-CM | POA: Diagnosis not present

## 2017-07-03 DIAGNOSIS — N183 Chronic kidney disease, stage 3 unspecified: Secondary | ICD-10-CM

## 2017-07-03 DIAGNOSIS — E039 Hypothyroidism, unspecified: Secondary | ICD-10-CM | POA: Diagnosis not present

## 2017-07-03 DIAGNOSIS — M25562 Pain in left knee: Secondary | ICD-10-CM | POA: Diagnosis not present

## 2017-07-03 LAB — RENAL FUNCTION PANEL
ALBUMIN: 4 g/dL (ref 3.5–5.2)
BUN: 37 mg/dL — AB (ref 6–23)
CO2: 26 mEq/L (ref 19–32)
Calcium: 9.7 mg/dL (ref 8.4–10.5)
Chloride: 105 mEq/L (ref 96–112)
Creatinine, Ser: 1.66 mg/dL — ABNORMAL HIGH (ref 0.40–1.20)
GFR: 32.75 mL/min — ABNORMAL LOW (ref >60.00)
GLUCOSE: 117 mg/dL — AB (ref 70–99)
Phosphorus: 4.3 mg/dL (ref 2.3–4.6)
Potassium: 5.2 mEq/L — ABNORMAL HIGH (ref 3.5–5.1)
SODIUM: 139 meq/L (ref 135–145)

## 2017-07-03 LAB — TSH: TSH: 2.8 u[IU]/mL (ref 0.35–4.50)

## 2017-07-03 LAB — VITAMIN D 25 HYDROXY (VIT D DEFICIENCY, FRACTURES): VITD: 47.31 ng/mL (ref 30.00–100.00)

## 2017-07-03 MED ORDER — METFORMIN HCL 500 MG PO TABS: 500.0000 mg | ORAL_TABLET | Freq: Every day | ORAL | 6 refills | Status: DC

## 2017-07-03 MED ORDER — SIMVASTATIN 20 MG PO TABS: 20.0000 mg | ORAL_TABLET | Freq: Every day | ORAL | 11 refills | Status: AC

## 2017-07-03 MED ORDER — LOSARTAN POTASSIUM 50 MG PO TABS: 50.0000 mg | ORAL_TABLET | Freq: Every day | ORAL | 11 refills | Status: DC

## 2017-07-03 NOTE — Patient Instructions (Addendum)
Decrease metformin to 500mg  once daily.  Labs today to recheck thyroid.  We will send you to blood doctor to check on low platelets.  Schedule medicare wellness visit with Katha Cabal in the next 1-2 months.  Follow up with me in 3 months.

## 2017-07-03 NOTE — Progress Notes (Addendum)
BP 122/70 (BP Location: Left Arm, Patient Position: Sitting, Cuff Size: Normal)   Pulse 60   Temp 98 F (36.7 C) (Oral)   Wt 197 lb (89.4 kg)   SpO2 97%   BMI 36.03 kg/m    CC: 1 mo f/u visit Subjective:    Patient ID: Gina [name], female    DOB: 14-Feb-1951, 66 y.o.   MRN: [id number]  HPI: Gina [name] is a 66 y.o. female presenting on 07/03/2017 for 1 mo follow-up (Pt provided copy of recent labs and record of BS readings)   Here alone today. See prior notes for details. Brings sugar log showing fasting readings 80-140s, post dinner 108-157.  Lab Results  Component Value Date   HGBA1C 6.0 06/01/2017     Brings recent labs showing persistent thrombocytopenia 114 and Cr elevation 1.7 (from prior 1.5). She endorses staying well hydrated daily.   Persistent left leg pain despite taking tylenol. Worse pain at bedtime. Thought arthritis and pes anserine bursitis.   She is compliant with levothyroxine - recent start 06/01/2017.  Did well with recent colonoscopy.   Relevant past medical, surgical, family and social history reviewed and updated as indicated. Interim medical history since our last visit reviewed. Allergies and medications reviewed and updated. Outpatient Medications Prior to Visit  Medication Sig Dispense Refill  . divalproex (DEPAKOTE) 500 MG DR tablet Take 1 tablet (500 mg total) by mouth 2 (two) times daily with a meal. 60 tablet 3  . lamoTRIgine (LAMICTAL) 25 MG tablet Take 25 mg by mouth daily.    Marland Kitchen levothyroxine (SYNTHROID, LEVOTHROID) 25 MCG tablet Take 1 tablet (25 mcg total) daily before breakfast by mouth. 30 tablet 3  . Melatonin 3 MG SUBL Place 1 tablet under the tongue at bedtime.    . metoprolol tartrate (LOPRESSOR) 25 MG tablet TAKE 1 & 1/2 TABLET BY MOUTH TWICE DAILY WITH FOOD 84 tablet 6  . nystatin (MYCOSTATIN/NYSTOP) powder Apply topically 3 (three) times daily. (Patient taking differently: Apply topically 3 (three) times daily as  needed. ) 60 g 1  . paliperidone (INVEGA SUSTENNA) 234 MG/1.5ML SUSP injection Inject 234 mg into the muscle every 30 (thirty) days.    . polyethylene glycol powder (GLYCOLAX/MIRALAX) powder TAKE 17 GRAMS BY MOUTH TWICE DAILY AS NEEDED FOR MODERATE CONSTIPATION. 527 g 3  . QUEtiapine (SEROQUEL) 200 MG tablet Take 200 mg by mouth at bedtime.    Marland Kitchen losartan (COZAAR) 50 MG tablet TAKE 1 TABLET BY MOUTH ONCE DAILY. 30 tablet 0  . metFORMIN (GLUCOPHAGE) 500 MG tablet Take 1 tablet (500 mg total) by mouth 2 (two) times daily with a meal. 60 tablet 6  . simvastatin (ZOCOR) 20 MG tablet TAKE ONE TABLET BY MOUTH AT BEDTIME. 30 tablet 0   No facility-administered medications prior to visit.      Per HPI unless specifically indicated in ROS section below Review of Systems     Objective:    BP 122/70 (BP Location: Left Arm, Patient Position: Sitting, Cuff Size: Normal)   Pulse 60   Temp 98 F (36.7 C) (Oral)   Wt 197 lb (89.4 kg)   SpO2 97%   BMI 36.03 kg/m   Wt Readings from Last 3 Encounters:  07/03/17 197 lb (89.4 kg)  06/09/17 199 lb (90.3 kg)  06/01/17 197 lb (89.4 kg)    Physical Exam  Constitutional: She appears well-developed and well-nourished. No distress.  HENT:  Mouth/Throat: Oropharynx is clear and moist. No oropharyngeal exudate.  Eyes: Conjunctivae and EOM are normal. Pupils are equal, round, and reactive to light.  Cardiovascular: Normal rate, regular rhythm, normal heart sounds and intact distal pulses.  No murmur heard. Pulmonary/Chest: Effort normal and breath sounds normal. No respiratory distress. She has no wheezes. She has no rales.  Musculoskeletal: She exhibits no edema.  Neurological:  Antalgic gait  Psychiatric: She has a normal mood and affect.  Nursing note and vitals reviewed.  Results for orders placed or performed in visit on 07/03/17  VITAMIN D 25 Hydroxy (Vit-D Deficiency, Fractures)  Result Value Ref Range   VITD 47.31 30.00 - 100.00 ng/mL  Renal  function panel  Result Value Ref Range   Sodium 139 135 - 145 mEq/L   Potassium 5.2 (H) 3.5 - 5.1 mEq/L   Chloride 105 96 - 112 mEq/L   CO2 26 19 - 32 mEq/L   Calcium 9.7 8.4 - 10.5 mg/dL   Albumin 4.0 3.5 - 5.2 g/dL   BUN 37 (H) 6 - 23 mg/dL   Creatinine, Ser 1.66 (H) 0.40 - 1.20 mg/dL   Glucose, Bld 117 (H) 70 - 99 mg/dL   Phosphorus 4.3 2.3 - 4.6 mg/dL   GFR 32.75 (L) >60.00 mL/min  TSH  Result Value Ref Range   TSH 2.80 0.35 - 4.50 uIU/mL  CBC and differential  Result Value Ref Range   Hemoglobin 11.4 (A) 12.0 - 16.0   Platelets 114 (A) 150 - 399   WBC 7.3   Basic metabolic panel  Result Value Ref Range   Glucose 132    BUN 28 (A) 4 - 21   Creatinine 1.7 (A) 0.5 - 1.1   Potassium 5.4 (A) 3.4 - 5.3   Sodium 142 137 - 147  Hepatic function panel  Result Value Ref Range   Alkaline Phosphatase 80 25 - 125   ALT 18 7 - 35   AST 22 13 - 35   Bilirubin, Total 0.2       Assessment & Plan:  Patient will return for medicare wellness visit with Katha Cabal.  Problem List Items Addressed This Visit    Acquired hypothyroidism    Update TSH on low dose levothyroxine. Reviewed correct administration.       Relevant Orders   TSH (Completed)   Bipolar I disorder, most recent episode (or current) manic (HCC)   CKD (chronic kidney disease) stage 3, GFR 30-59 ml/min (HCC)    Will decrease metformin to 500mg  daily. Caution with invega depo IM in progressive CKD over the past 6 months.       Relevant Orders   VITAMIN D 25 Hydroxy (Vit-D Deficiency, Fractures) (Completed)   Renal function panel (Completed)   Controlled type 2 diabetes mellitus with diabetic nephropathy ([place])    In progressive CKD I have asked patient to decrease metformin to 500mg  once daily.       Relevant Medications   metFORMIN (GLUCOPHAGE) 500 MG tablet   losartan (COZAAR) 50 MG tablet   simvastatin (ZOCOR) 20 MG tablet   Left knee pain    Ongoing. Thought pes anserine bursitis + osteoarthritis. Will refer  back to sports medicine for further eval, consideration of steroid injection. Last done 08/2015.       Relevant Orders   Ambulatory referral to Sports Medicine   Thrombocytopenia ([place]) - Primary    Chronic, mild, ?depakote related.  Trend: plt 108 (2016), 156 (12/2015), 108 (04/2016), 116 (12/2016), 114 (08/2016) periph smear 2016 - teardrop cells (dacrocytosis), persists 2018.  Will refer to heme given ongoing chronic thrombocytopenia for further eval, recs on depakote. Latest depakote level 06/24/2017 was 42.       Relevant Orders   Ambulatory referral to Hematology       Follow up plan: Return in about 3 months (around 10/01/2017) for annual exam, prior fasting for blood work.  Ria Bush, MD

## 2017-07-05 NOTE — Assessment & Plan Note (Signed)
In progressive CKD I have asked patient to decrease metformin to 500mg  once daily.

## 2017-07-05 NOTE — Assessment & Plan Note (Addendum)
Chronic, mild, ?depakote related.  Trend: plt 108 (2016), 156 (12/2015), 108 (04/2016), 116 (12/2016), 114 (08/2016) periph smear 2016 - teardrop cells (dacrocytosis), persists 2018.  Will refer to heme given ongoing chronic thrombocytopenia for further eval, recs on depakote. Latest depakote level 06/24/2017 was 42.

## 2017-07-05 NOTE — Assessment & Plan Note (Signed)
Will decrease metformin to 500mg  daily. Caution with invega depo IM in progressive CKD over the past 6 months.

## 2017-07-05 NOTE — Assessment & Plan Note (Signed)
Ongoing. Thought pes anserine bursitis + osteoarthritis. Will refer back to sports medicine for further eval, consideration of steroid injection. Last done 08/2015.

## 2017-07-05 NOTE — Assessment & Plan Note (Signed)
Update TSH on low dose levothyroxine. Reviewed correct administration.

## 2017-07-09 ENCOUNTER — Ambulatory Visit: Payer: Self-pay | Admitting: Family Medicine

## 2017-07-13 ENCOUNTER — Encounter: Payer: Self-pay | Admitting: Oncology

## 2017-07-14 ENCOUNTER — Inpatient Hospital Stay: Payer: Medicare Other | Admitting: Oncology

## 2017-07-14 NOTE — Progress Notes (Deleted)
Hematology/Oncology Consult note [facility] [phone] [phone]   Patient Care Team: Ria Bush, MD as PCP - General (Family Medicine)  REFERRING PROVIDER:  CHIEF COMPLAINTS/PURPOSE OF CONSULTATION:    HISTORY OF PRESENTING ILLNESS:  Gina MEINDERS is a  66 y.o.  female with PMH listed below who was referred to me for evaluation of     ROS  MEDICAL HISTORY:  Past Medical History:  Diagnosis Date  . Anemia in chronic kidney disease   . Bipolar depression [facility])    sees psychiatrist - psych admission 03/2015  . Bladder cancer ([place]) 1990's  . Blood transfusion without reported diagnosis 2009  . Cataract    left  . CKD (chronic kidney disease) stage 3, GFR 30-59 ml/min (HCC)   . Colon cancer ([place]) 1990's  . DJD (degenerative joint disease), lumbar    chronic lower back pain  . Enterocutaneous fistula 04/07/2012   completed PT 06/2012 ([facility])  . Family history of breast cancer   . Family history of colon cancer   . Family history of stomach cancer   . GERD (gastroesophageal reflux disease)   . History of bladder cancer 1997  . History of colon cancer    s/p surgery  . History of uterine cancer    s/p hysterectomy  . Hyperlipidemia   . Hypertension   . IDA (iron deficiency anemia) 01/2013   thought due to h/o polyps  . Obesity (BMI 30-39.9)   . OSA on CPAP    6cm H2O  . Personal history of colonic adenomas and colon cancer 11/11/2012  . Positive hepatitis C antibody test 09/2014   but negative confirmatory testing  . RBBB   . Uncontrolled type 2 diabetes mellitus with nephropathy ([place]) 06/07/2014   completed DSME 02/2016    SURGICAL HISTORY: Past Surgical History:  Procedure Laterality Date  . BREAST BIOPSY Right 01/2014   fibroadenoma  . COLONOSCOPY  07/2009  . COLONOSCOPY  11/2012   2 tubular adenomas, mild diverticulosis, pending genetic testing for Lynch syndrome [name]) rpt 2 yrs  . COLONOSCOPY   02/2015   TA, diverticulosis, rpt 2 yrs [name])  . COLONOSCOPY WITH PROPOFOL N/A 06/09/2017   TAx1, rpt 2 yrs [name], [name], MD)  . DEXA  12/2009   WNL  . DOBUTAMINE STRESS ECHO  12/2009   no evidence of ischemia  . HERNIA REPAIR  02/04/12  . i & d abdominal wound  02/19/12  . LEFT OOPHORECTOMY  2005  . PARTIAL COLECTOMY  about 2008   for colon cancer  . PARTIAL HYSTERECTOMY  1981   uterine cancer, R ovary remains  . Reexploration of abdominal wound and Allograft placemet  02/26/12  . Removal of infected mesh and abdominal wound vac placement  02/24/12  . sleep study  02/2014   OSA - AHI 55, nadir 81% [name])    SOCIAL HISTORY: Social History   Socioeconomic History  . Marital status: Single    Spouse name: Not on file  . Number of children: 0  . Years of education: Not on file  . Highest education level: Not on file  Social Needs  . Financial resource strain: Not on file  . Food insecurity - worry: Not on file  . Food insecurity - inability: Not on file  . Transportation needs - medical: Not on file  . Transportation needs - non-medical: Not on file  Occupational History  . Not on file  Tobacco Use  . Smoking status:  Never Smoker  . Smokeless tobacco: Never Used  Substance and Sexual Activity  . Alcohol use: No    Alcohol/week: 0.0 oz  . Drug use: No  . Sexual activity: No  Other Topics Concern  . Not on file  Social History Narrative   Lives with sister, no pets   Occupation: disabled, for bipolar and arthritis   Edu: GED   Activity: take walks   Diet: good water, vegetables daily   Religion: [place], Dr. Jimmye Norman (ph [phone])    FAMILY HISTORY: Family History  Problem Relation Age of Onset  . Colon cancer Mother 22  . Stomach cancer Mother        dx in her 23s?  Marland Kitchen Colon cancer Sister 70       Maternal half sister  . CAD Father        MI  . Mental illness Sister        anxiety/depression  . Thyroid disease  Sister   . Breast cancer Maternal Grandmother   . Diabetes Brother   . Diabetes Brother   . Diabetes Other        aunts and uncles both sides  . Arthritis Other        strong fmhx  . Esophageal cancer Neg Hx   . Rectal cancer Neg Hx     ALLERGIES:  is allergic to risperidone and related; penicillins; ivp dye [iodinated diagnostic agents]; tetanus toxoids; and zetia [ezetimibe].  MEDICATIONS:  Current Outpatient Medications  Medication Sig Dispense Refill  . divalproex (DEPAKOTE) 500 MG DR tablet Take 1 tablet (500 mg total) by mouth 2 (two) times daily with a meal. 60 tablet 3  . lamoTRIgine (LAMICTAL) 25 MG tablet Take 25 mg by mouth daily.    Marland Kitchen levothyroxine (SYNTHROID, LEVOTHROID) 25 MCG tablet Take 1 tablet (25 mcg total) daily before breakfast by mouth. 30 tablet 3  . losartan (COZAAR) 50 MG tablet Take 1 tablet (50 mg total) by mouth daily. 30 tablet 11  . Melatonin 3 MG SUBL Place 1 tablet under the tongue at bedtime.    . metFORMIN (GLUCOPHAGE) 500 MG tablet Take 1 tablet (500 mg total) by mouth daily with breakfast. 30 tablet 6  . metoprolol tartrate (LOPRESSOR) 25 MG tablet TAKE 1 & 1/2 TABLET BY MOUTH TWICE DAILY WITH FOOD 84 tablet 6  . nystatin (MYCOSTATIN/NYSTOP) powder Apply topically 3 (three) times daily. (Patient taking differently: Apply topically 3 (three) times daily as needed. ) 60 g 1  . paliperidone (INVEGA SUSTENNA) 234 MG/1.5ML SUSP injection Inject 234 mg into the muscle every 30 (thirty) days.    . polyethylene glycol powder (GLYCOLAX/MIRALAX) powder TAKE 17 GRAMS BY MOUTH TWICE DAILY AS NEEDED FOR MODERATE CONSTIPATION. 527 g 3  . QUEtiapine (SEROQUEL) 200 MG tablet Take 200 mg by mouth at bedtime.    . simvastatin (ZOCOR) 20 MG tablet Take 1 tablet (20 mg total) by mouth at bedtime. 30 tablet 11   No current facility-administered medications for this visit.      PHYSICAL EXAMINATION: ECOG PERFORMANCE STATUS: {CHL ONC ECOG PS:[id number]} There were no  vitals filed for this visit. There were no vitals filed for this visit.  Physical Exam   LABORATORY DATA:  I have reviewed the data as listed Lab Results  Component Value Date   WBC 7.3 06/24/2017   HGB 11.4 (A) 06/24/2017   HCT 35.2 04/13/2017   MCV 92.9  04/13/2017   PLT 114 (A) 06/24/2017   Recent Labs    03/27/17 04/11/17 1558 04/13/17 0041 06/01/17 1005 06/24/17 07/03/17 1143  NA 144 139 137 140 142 139  K 5.4* 4.5 4.7 4.6 5.4* 5.2*  CL  --  104 103 103  --  105  CO2  --  23 25 28   --  26  GLUCOSE  --  125* 170* 124*  --  117*  BUN  --  28* 31* 27* 28* 37*  CREATININE 1.3* 1.54* 1.57* 1.51* 1.7* 1.66*  CALCIUM  --  10.0 8.7* 9.8  --  9.7  GFRNONAA  --  34* 33*  --   --   --   GFRAA  --  39* 39*  --   --   --   PROT  --  8.3*  --   --   --   --   ALBUMIN  --  4.2  --  4.2  --  4.0  AST 18 26  --   --  22  --   ALT 16 22  --   --  18  --   ALKPHOS 67 72  --   --  80  --   BILITOT  --  0.6  --   --   --   --        ASSESSMENT & PLAN:  No diagnosis found.  For the work up of patient's thrombocytopenia, I recommend checking CBC;CMP, LDH; pathology smear review, folate, Vitamin B12, hepatitis, HIV and monoclonal gammopathy workup. Will also check ultrasound of the abdomen.  Also, discussed with the patient that if no clear etiology found- bone marrow biopsy would be suggested. Currently await for the above workup.   # Patient follow-up with me in approximately 2 weeks to review the above results. All questions were answered. The patient knows to call the clinic with any problems questions or concerns.  Return of visit:  Thank you for this kind referral and the opportunity to participate in the care of this patient. A copy of today's note is routed to referring provider    Earlie Server, MD, PhD Hematology Oncology [facility] at [facility] Pager- [phone] 07/14/2017

## 2017-07-15 ENCOUNTER — Ambulatory Visit: Payer: Self-pay | Admitting: Family Medicine

## 2017-07-22 ENCOUNTER — Ambulatory Visit: Payer: Self-pay | Admitting: Family Medicine

## 2017-07-22 ENCOUNTER — Ambulatory Visit (INDEPENDENT_AMBULATORY_CARE_PROVIDER_SITE_OTHER): Payer: Medicare Other

## 2017-07-22 VITALS — BP 102/70 | HR 51 | Temp 97.9°F | Ht 61.0 in | Wt 195.0 lb

## 2017-07-22 DIAGNOSIS — Z0000 Encounter for general adult medical examination without abnormal findings: Secondary | ICD-10-CM | POA: Diagnosis not present

## 2017-07-22 DIAGNOSIS — Z23 Encounter for immunization: Secondary | ICD-10-CM

## 2017-07-22 NOTE — Progress Notes (Signed)
PCP notes:   Health maintenance:  PPSV23 - administered  Abnormal screenings:   Hearing - failed  Hearing Screening   125Hz  250Hz  500Hz  1000Hz  2000Hz  3000Hz  4000Hz  6000Hz  8000Hz   Right ear:   0 0 0  0    Left ear:   40 0 40  0      Patient concerns:   None  Nurse concerns:  None  Next PCP appt:   N/A; last OV in 06/2017

## 2017-07-22 NOTE — Patient Instructions (Addendum)
Ms. Prunty , Thank you for taking time to come for your Medicare Wellness Visit. I appreciate your ongoing commitment to your health goals. Please review the following plan we discussed and let me know if I can assist you in the future.   These are the goals we discussed: Goals    . DIET - INCREASE WATER INTAKE     Starting 07/22/2017, I will attempt to drink at least 6 hours of water daily.        This is a list of the screening recommended for you and due dates:  Health Maintenance  Topic Date Due  . Tetanus Vaccine  08/27/2022*  . Eye exam for diabetics  11/21/2017  . Hemoglobin A1C  11/30/2017  . Complete foot exam   12/31/2017  . Mammogram  01/26/2018  . Colon Cancer Screening  06/10/2019  . Flu Shot  Completed  . DEXA scan (bone density measurement)  Completed  .  Hepatitis C: One time screening is recommended by Center for Disease Control  (CDC) for  adults born from [birth year] through 1966.   Completed  . Pneumonia vaccines  Completed  *Topic was postponed. The date shown is not the original due date.   Preventive Care for Adults  A healthy lifestyle and preventive care can promote health and wellness. Preventive health guidelines for adults include the following key practices.  . A routine yearly physical is a good way to check with your health care provider about your health and preventive screening. It is a chance to share any concerns and updates on your health and to receive a thorough exam.  . Visit your dentist for a routine exam and preventive care every 6 months. Brush your teeth twice a day and floss once a day. Good oral hygiene prevents tooth decay and gum disease.  . The frequency of eye exams is based on your age, health, family medical history, use  of contact lenses, and other factors. Follow your health care provider's recommendations for frequency of eye exams.  . Eat a healthy diet. Foods like vegetables, fruits, whole grains, low-fat dairy products,  and lean protein foods contain the nutrients you need without too many calories. Decrease your intake of foods high in solid fats, added sugars, and salt. Eat the right amount of calories for you. Get information about a proper diet from your health care provider, if necessary.  . Regular physical exercise is one of the most important things you can do for your health. Most adults should get at least 150 minutes of moderate-intensity exercise (any activity that increases your heart rate and causes you to sweat) each week. In addition, most adults need muscle-strengthening exercises on 2 or more days a week.  Silver Sneakers may be a benefit available to you. To determine eligibility, you may visit the website: www.silversneakers.com or contact program at 773-534-9416 Mon-Fri between 8AM-8PM.   . Maintain a healthy weight. The body mass index (BMI) is a screening tool to identify possible weight problems. It provides an estimate of body fat based on height and weight. Your health care provider can find your BMI and can help you achieve or maintain a healthy weight.   For adults 20 years and older: ? A BMI below 18.5 is considered underweight. ? A BMI of 18.5 to 24.9 is normal. ? A BMI of 25 to 29.9 is considered overweight. ? A BMI of 30 and above is considered obese.   . Maintain normal blood lipids and  cholesterol levels by exercising and minimizing your intake of saturated fat. Eat a balanced diet with plenty of fruit and vegetables. Blood tests for lipids and cholesterol should begin at age 70 and be repeated every 5 years. If your lipid or cholesterol levels are high, you are over 50, or you are at high risk for heart disease, you may need your cholesterol levels checked more frequently. Ongoing high lipid and cholesterol levels should be treated with medicines if diet and exercise are not working.  . If you smoke, find out from your health care provider how to quit. If you do not use tobacco,  please do not start.  . If you choose to drink alcohol, please do not consume more than 2 drinks per day. One drink is considered to be 12 ounces (355 mL) of beer, 5 ounces (148 mL) of wine, or 1.5 ounces (44 mL) of liquor.  . If you are 13-21 years old, ask your health care provider if you should take aspirin to prevent strokes.  . Use sunscreen. Apply sunscreen liberally and repeatedly throughout the day. You should seek shade when your shadow is shorter than you. Protect yourself by wearing long sleeves, pants, a wide-brimmed hat, and sunglasses year round, whenever you are outdoors.  . Once a month, do a whole body skin exam, using a mirror to look at the skin on your back. Tell your health care provider of new moles, moles that have irregular borders, moles that are larger than a pencil eraser, or moles that have changed in shape or color.

## 2017-07-22 NOTE — Progress Notes (Signed)
Subjective:   Gina [name] is a 66 y.o. female who presents for Medicare Annual (Subsequent) preventive examination.  Review of Systems:  N/A Cardiac Risk Factors include: advanced age (>23men, >82 women);obesity (BMI >30kg/m2);diabetes mellitus;dyslipidemia;hypertension     Objective:     Vitals: BP 102/70 (BP Location: Right Arm, Patient Position: Sitting, Cuff Size: Normal)   Pulse (!) 51   Temp 97.9 F (36.6 C) (Oral)   Ht 5\' 1"  (1.549 m) Comment: shoes  Wt 195 lb (88.5 kg)   SpO2 98%   BMI 36.84 kg/m   Body mass index is 36.84 kg/m.  Advanced Directives 07/22/2017 06/09/2017 05/15/2017 04/08/2017 01/11/2016 12/25/2015 06/12/2015  Does Patient Have a Medical Advance Directive? Yes Yes Yes No Yes Yes Yes  Type of Paramedic of [place];Living will [place];Living will [place];Living will - Living will;Healthcare Power of Attorney Living will;Healthcare Power of Attorney Living will  Does patient want to make changes to medical advance directive? - - No - Patient declined - No - Patient declined No - Patient declined No - Patient declined  Copy of [place] in Chart? No - copy requested No - copy requested - - - No - copy requested No - copy requested  Would patient like information on creating a medical advance directive? - - - No - Patient declined - - -  Pre-existing out of facility DNR order (yellow form or pink MOST form) - - - - - - -  Some encounter information is confidential and restricted. Go to Review Flowsheets activity to see all data.    Tobacco Social History   Tobacco Use  Smoking Status Never Smoker  Smokeless Tobacco Never Used     Counseling given: No   Clinical Intake:  Pre-visit preparation completed: Yes  Pain : 0-10 Pain Score: 10-Worst pain ever Pain Type: Chronic pain Pain Location: Knee Pain Orientation: Left, Right Pain Onset: More than a month  ago Pain Frequency: Constant     Nutritional Status: BMI > 30  Obese Nutritional Risks: None Diabetes: Yes CBG done?: No Did pt. bring in CBG monitor from home?: No  How often do you need to have someone help you when you read instructions, pamphlets, or other written materials from your doctor or pharmacy?: 1 - Never What is the last grade level you completed in school?: GED  Interpreter Needed?: No  Comments: pt lives with sister Information entered by :: LPinson, LPN  Past Medical History:  Diagnosis Date  . Anemia in chronic kidney disease   . Bipolar depression [facility])    sees psychiatrist - psych admission 03/2015  . Bladder cancer ([place]) 1990's  . Blood transfusion without reported diagnosis 2009  . Cataract    left  . CKD (chronic kidney disease) stage 3, GFR 30-59 ml/min (HCC)   . Colon cancer ([place]) 1990's  . DJD (degenerative joint disease), lumbar    chronic lower back pain  . Enterocutaneous fistula 04/07/2012   completed PT 06/2012 ([facility])  . Family history of breast cancer   . Family history of colon cancer   . Family history of stomach cancer   . GERD (gastroesophageal reflux disease)   . History of bladder cancer 1997  . History of colon cancer    s/p surgery  . History of uterine cancer    s/p hysterectomy  . Hyperlipidemia   . Hypertension   . IDA (iron deficiency anemia) 01/2013  thought due to h/o polyps  . Obesity (BMI 30-39.9)   . OSA on CPAP    6cm H2O  . Personal history of colonic adenomas and colon cancer 11/11/2012  . Positive hepatitis C antibody test 09/2014   but negative confirmatory testing  . RBBB   . Uncontrolled type 2 diabetes mellitus with nephropathy ([place]) 06/07/2014   completed DSME 02/2016   Past Surgical History:  Procedure Laterality Date  . BREAST BIOPSY Right 01/2014   fibroadenoma  . COLONOSCOPY  07/2009  . COLONOSCOPY  11/2012   2 tubular adenomas, mild diverticulosis, pending genetic testing for Lynch syndrome  [name]) rpt 2 yrs  . COLONOSCOPY  02/2015   TA, diverticulosis, rpt 2 yrs [name])  . COLONOSCOPY WITH PROPOFOL N/A 06/09/2017   TAx1, rpt 2 yrs [name], [name], MD)  . DEXA  12/2009   WNL  . DOBUTAMINE STRESS ECHO  12/2009   no evidence of ischemia  . HERNIA REPAIR  02/04/12  . i & d abdominal wound  02/19/12  . LEFT OOPHORECTOMY  2005  . PARTIAL COLECTOMY  about 2008   for colon cancer  . PARTIAL HYSTERECTOMY  1981   uterine cancer, R ovary remains  . Reexploration of abdominal wound and Allograft placemet  02/26/12  . Removal of infected mesh and abdominal wound vac placement  02/24/12  . sleep study  02/2014   OSA - AHI 55, nadir 81% [name])   Family History  Problem Relation Age of Onset  . Colon cancer Mother 64  . Stomach cancer Mother        dx in her 82s?  Marland Kitchen Colon cancer Sister 31       Maternal half sister  . CAD Father        MI  . Mental illness Sister        anxiety/depression  . Thyroid disease Sister   . Breast cancer Maternal Grandmother   . Diabetes Brother   . Diabetes Brother   . Diabetes Other        aunts and uncles both sides  . Arthritis Other        strong fmhx  . Esophageal cancer Neg Hx   . Rectal cancer Neg Hx    Social History   Socioeconomic History  . Marital status: Single    Spouse name: None  . Number of children: 0  . Years of education: None  . Highest education level: None  Social Needs  . Financial resource strain: None  . Food insecurity - worry: None  . Food insecurity - inability: None  . Transportation needs - medical: None  . Transportation needs - non-medical: None  Occupational History  . None  Tobacco Use  . Smoking status: Never Smoker  . Smokeless tobacco: Never Used  Substance and Sexual Activity  . Alcohol use: No    Alcohol/week: 0.0 oz  . Drug use: No  . Sexual activity: No  Other Topics Concern  . None  Social History Narrative   Lives with sister, no pets   Occupation: disabled, for bipolar and  arthritis   Edu: GED   Activity: take walks   Diet: good water, vegetables daily   Religion: [place], Dr. Jimmye Norman (ph [phone])    Outpatient Encounter Medications as of 07/22/2017  Medication Sig  . divalproex (DEPAKOTE) 500 MG DR tablet Take 1 tablet (500 mg total) by mouth 2 (two) times  daily with a meal.  . lamoTRIgine (LAMICTAL) 25 MG tablet Take 25 mg by mouth daily.  Marland Kitchen levothyroxine (SYNTHROID, LEVOTHROID) 25 MCG tablet Take 1 tablet (25 mcg total) daily before breakfast by mouth.  . losartan (COZAAR) 50 MG tablet Take 1 tablet (50 mg total) by mouth daily.  Marland Kitchen MELATONIN PO Take 1 tablet by mouth at bedtime.  . metFORMIN (GLUCOPHAGE) 500 MG tablet Take 1 tablet (500 mg total) by mouth daily with breakfast.  . metoprolol tartrate (LOPRESSOR) 25 MG tablet TAKE 1 & 1/2 TABLET BY MOUTH TWICE DAILY WITH FOOD  . paliperidone (INVEGA SUSTENNA) 234 MG/1.5ML SUSP injection Inject 234 mg into the muscle every 30 (thirty) days.  . polyethylene glycol powder (GLYCOLAX/MIRALAX) powder TAKE 17 GRAMS BY MOUTH TWICE DAILY AS NEEDED FOR MODERATE CONSTIPATION.  . QUEtiapine (SEROQUEL) 200 MG tablet Take 200 mg by mouth at bedtime.  . simvastatin (ZOCOR) 20 MG tablet Take 1 tablet (20 mg total) by mouth at bedtime.  . [DISCONTINUED] Melatonin 3 MG SUBL Place 1 tablet under the tongue at bedtime.  Marland Kitchen nystatin (MYCOSTATIN/NYSTOP) powder Apply topically 3 (three) times daily. (Patient not taking: Reported on 07/22/2017)   No facility-administered encounter medications on file as of 07/22/2017.     Activities of Daily Living In your present state of health, do you have any difficulty performing the following activities: 07/22/2017  Hearing? N  Vision? N  Difficulty concentrating or making decisions? N  Walking or climbing stairs? N  Dressing or bathing? N  Doing errands, shopping? Y  Comment does not Physiological scientist and eating ? N  Using the Toilet?  N  In the past six months, have you accidently leaked urine? N  Do you have problems with loss of bowel control? N  Managing your Medications? N  Managing your Finances? N  Housekeeping or managing your Housekeeping? N  Some recent data might be hidden    Patient Care Team: Ria Bush, MD as PCP - General (Family Medicine)    Assessment:   This is a routine wellness examination for [facility].   Hearing Screening   125Hz  250Hz  500Hz  1000Hz  2000Hz  3000Hz  4000Hz  6000Hz  8000Hz   Right ear:   0 0 0  0    Left ear:   40 0 40  0      Visual Acuity Screening   Right eye Left eye Both eyes  Without correction:     With correction: 20/40-2 20/40-2 20/30-1     Exercise Activities and Dietary recommendations Current Exercise Habits: The patient does not participate in regular exercise at present, Exercise limited by: orthopedic condition(s)  Goals    . DIET - INCREASE WATER INTAKE     Starting 07/22/2017, I will attempt to drink at least 6 hours of water daily.        Fall Risk Fall Risk  07/22/2017 05/15/2017 06/04/2016 02/28/2016 02/21/2016  Falls in the past year? No No No No No  Number falls in past yr: - - - - -  Injury with Fall? - - - - -  Comment - - - - -  Risk Factor Category  - - - - -  Risk for fall due to : - - - - -  Risk for fall due to: Comment - - - - -  Follow up - - - - -  Comment - - - - -   Depression Screen PHQ 2/9 Scores 07/22/2017 05/15/2017 06/04/2016 01/11/2016  PHQ - 2 Score  0 0 0 1  PHQ- 9 Score 0 - - -  Exception Documentation - - - -     Cognitive Function MMSE - Mini Mental State Exam 07/22/2017  Orientation to time 5  Orientation to Place 5  Registration 3  Attention/ Calculation 0  Recall 3  Language- name 2 objects 0  Language- repeat 1  Language- follow 3 step command 3  Language- read & follow direction 0  Write a sentence 0  Copy design 0  Total score 20     PLEASE NOTE: A Mini-Cog screen was completed. Maximum score is  20. A value of 0 denotes this part of [place] MMSE was not completed or the patient failed this part of the Mini-Cog screening.   Mini-Cog Screening Orientation to Time - Max 5 pts Orientation to Place - Max 5 pts Registration - Max 3 pts Recall - Max 3 pts Language Repeat - Max 1 pts Language Follow 3 Step Command - Max 3 pts     Immunization History  Administered Date(s) Administered  . Influenza, High Dose Seasonal PF 03/12/2016  . Influenza,inj,Quad PF,6+ Mos 04/05/2013, 04/20/2015  . Influenza-Unspecified 05/04/2014, 03/09/2017  . Pneumococcal Conjugate-13 03/19/2016  . Pneumococcal Polysaccharide-23 07/22/2017    Screening Tests Health Maintenance  Topic Date Due  . TETANUS/TDAP  08/27/2022 (Originally 06/14/1969)  . OPHTHALMOLOGY EXAM  11/21/2017  . HEMOGLOBIN A1C  11/30/2017  . FOOT EXAM  12/31/2017  . MAMMOGRAM  01/26/2018  . COLONOSCOPY  06/10/2019  . INFLUENZA VACCINE  Completed  . DEXA SCAN  Completed  . Hepatitis C Screening  Completed  . PNA vac Low Risk Adult  Completed       Plan:     I have personally reviewed, addressed, and noted the following in the patient's chart:  A. Medical and social history B. Use of alcohol, tobacco or illicit drugs  C. Current medications and supplements D. Functional ability and status E.  Nutritional status F.  Physical activity G. Advance directives H. List of other physicians I.  Hospitalizations, surgeries, and ER visits in previous 12 months J.  [place] to include hearing, vision, cognitive, depression L. Referrals and appointments - none  In addition, I have reviewed and discussed with patient certain preventive protocols, quality metrics, and best practice recommendations. A written personalized care plan for preventive services as well as general preventive health recommendations were provided to patient.  See attached scanned questionnaire for additional information.   Signed,   Lindell Noe, MHA, BS, LPN Health Coach

## 2017-07-22 NOTE — Progress Notes (Signed)
Pre visit review using our clinic review tool, if applicable. No additional management support is needed unless otherwise documented below in the visit note. 

## 2017-07-23 ENCOUNTER — Other Ambulatory Visit: Payer: Self-pay

## 2017-07-23 ENCOUNTER — Encounter: Payer: Self-pay | Admitting: Family Medicine

## 2017-07-23 ENCOUNTER — Ambulatory Visit (INDEPENDENT_AMBULATORY_CARE_PROVIDER_SITE_OTHER): Payer: Medicare Other | Admitting: Family Medicine

## 2017-07-23 VITALS — BP 120/70 | HR 46 | Temp 97.5°F | Ht 61.0 in | Wt 196.5 lb

## 2017-07-23 DIAGNOSIS — M1712 Unilateral primary osteoarthritis, left knee: Secondary | ICD-10-CM | POA: Diagnosis not present

## 2017-07-23 MED ORDER — METHYLPREDNISOLONE ACETATE 40 MG/ML IJ SUSP
80.0000 mg | Freq: Once | INTRAMUSCULAR | Status: AC
  Administered 2017-07-23: 80 mg via INTRA_ARTICULAR

## 2017-07-23 NOTE — Progress Notes (Signed)
Dr. Frederico Hamman T. Jamas Jaquay, MD, [facility] [place], [address] Phone: [phone] Fax: [phone]  07/23/2017  Patient: Gina [name], MRN: [id number], DOB: 22-Jul-1951, 66 y.o.  Primary Physician:  Ria Bush, MD   No chief complaint on file.  Subjective:   Gina [name] is a 66 y.o. very pleasant female patient who presents with the following:  F/u L knee pain and L knee OA: Pleasant patient who I remember quite well having seen her several times in the past presents with left-sided knee pain.  She has known advanced osteoarthritis of the left knee.  She has had several different corticosteroid injections in the past with a good response.  She also does have controlled bipolar illness, and she is compliant with taking her medications.  L knee injection  Past Medical History, Surgical History, Social History, Family History, Problem List, Medications, and Allergies have been reviewed and updated if relevant.  Patient Active Problem List   Diagnosis Date Noted  . Personal history of colon cancer   . Benign neoplasm of ascending colon   . Bipolar affective disorder, current episode hypomanic ([place]) 04/08/2017  . Low back pain radiating to left lower extremity 02/26/2017  . Acquired hypothyroidism 09/10/2016  . Thrombocytopenia ([place]) 06/15/2016  . Medicare annual wellness visit, subsequent 06/04/2016  . Advanced care planning/counseling discussion 06/04/2016  . External hemorrhoid 04/02/2016  . Chronic constipation 01/04/2016  . Bipolar I disorder, most recent episode (or current) manic ([place]) 12/03/2015  . Acute renal failure superimposed on stage 3 chronic kidney disease ([place]) 03/30/2015  . NASH (nonalcoholic steatohepatitis) 03/28/2015  . Gallbladder calculus without cholecystitis 03/28/2015  . Gout 03/27/2015  . HCV antibody positive 03/27/2015  . History of bladder cancer 03/27/2015  .  History of bundle branch block 03/27/2015  . Slow transit constipation 03/27/2015  . Genetic testing 02/28/2015  . Loss of memory 09/04/2014  . Controlled type 2 diabetes mellitus with diabetic nephropathy ([place]) 06/07/2014  . Hyperlipidemia   . Anemia in CKD (chronic kidney disease) 01/26/2013  . Personal history of colonic adenomas and colon cancer 11/11/2012  . Left knee pain 10/02/2012  . OSA on CPAP   . CKD (chronic kidney disease) stage 3, GFR 30-59 ml/min (HCC)   . Obesity, Class II, BMI 35-39.9, with comorbidity ([place])   . HTN (hypertension), benign 04/07/2012  . Hx of colon cancer, stage I 08/05/2011    Past Medical History:  Diagnosis Date  . Anemia in chronic kidney disease   . Bipolar depression [facility])    sees psychiatrist - psych admission 03/2015  . Bladder cancer ([place]) 1990's  . Blood transfusion without reported diagnosis 2009  . Cataract    left  . CKD (chronic kidney disease) stage 3, GFR 30-59 ml/min (HCC)   . Colon cancer ([place]) 1990's  . DJD (degenerative joint disease), lumbar    chronic lower back pain  . Enterocutaneous fistula 04/07/2012   completed PT 06/2012 ([facility])  . Family history of breast cancer   . Family history of colon cancer   . Family history of stomach cancer   . GERD (gastroesophageal reflux disease)   . History of bladder cancer 1997  . History of colon cancer    s/p surgery  . History of uterine cancer    s/p hysterectomy  . Hyperlipidemia   . Hypertension   . IDA (iron deficiency anemia) 01/2013   thought due to h/o polyps  .  Obesity (BMI 30-39.9)   . OSA on CPAP    6cm H2O  . Personal history of colonic adenomas and colon cancer 11/11/2012  . Positive hepatitis C antibody test 09/2014   but negative confirmatory testing  . RBBB   . Uncontrolled type 2 diabetes mellitus with nephropathy ([place]) 06/07/2014   completed DSME 02/2016    Past Surgical History:  Procedure Laterality Date  . BREAST BIOPSY Right 01/2014   fibroadenoma   . COLONOSCOPY  07/2009  . COLONOSCOPY  11/2012   2 tubular adenomas, mild diverticulosis, pending genetic testing for Lynch syndrome [name]) rpt 2 yrs  . COLONOSCOPY  02/2015   TA, diverticulosis, rpt 2 yrs [name])  . COLONOSCOPY WITH PROPOFOL N/A 06/09/2017   TAx1, rpt 2 yrs [name], [name], MD)  . DEXA  12/2009   WNL  . DOBUTAMINE STRESS ECHO  12/2009   no evidence of ischemia  . HERNIA REPAIR  02/04/12  . i & d abdominal wound  02/19/12  . LEFT OOPHORECTOMY  2005  . PARTIAL COLECTOMY  about 2008   for colon cancer  . PARTIAL HYSTERECTOMY  1981   uterine cancer, R ovary remains  . Reexploration of abdominal wound and Allograft placemet  02/26/12  . Removal of infected mesh and abdominal wound vac placement  02/24/12  . sleep study  02/2014   OSA - AHI 55, nadir 81% [name])    Social History   Socioeconomic History  . Marital status: Single    Spouse name: Not on file  . Number of children: 0  . Years of education: Not on file  . Highest education level: Not on file  Social Needs  . Financial resource strain: Not on file  . Food insecurity - worry: Not on file  . Food insecurity - inability: Not on file  . Transportation needs - medical: Not on file  . Transportation needs - non-medical: Not on file  Occupational History  . Not on file  Tobacco Use  . Smoking status: Never Smoker  . Smokeless tobacco: Never Used  Substance and Sexual Activity  . Alcohol use: No    Alcohol/week: 0.0 oz  . Drug use: No  . Sexual activity: No  Other Topics Concern  . Not on file  Social History Narrative   Lives with sister, no pets   Occupation: disabled, for bipolar and arthritis   Edu: GED   Activity: take walks   Diet: good water, vegetables daily   Religion: [place], Dr. Jimmye Norman (ph [phone])    Family History  Problem Relation Age of Onset  . Colon cancer Mother 3  . Stomach cancer Mother        dx in her 77s?  Marland Kitchen Colon cancer  Sister 50       Maternal half sister  . CAD Father        MI  . Mental illness Sister        anxiety/depression  . Thyroid disease Sister   . Breast cancer Maternal Grandmother   . Diabetes Brother   . Diabetes Brother   . Diabetes Other        aunts and uncles both sides  . Arthritis Other        strong fmhx  . Esophageal cancer Neg Hx   . Rectal cancer Neg Hx     Allergies  Allergen Reactions  . Risperidone And Related Other (See Comments)  Reaction:  Altered mental status   . Penicillins Other (See Comments)    Pt states that this med knocks her out.  Has patient had a PCN reaction causing immediate rash, facial/tongue/throat swelling, SOB or lightheadedness with hypotension Unsure  Has patient had a PCN reaction causing severe rash involving mucus membranes or skin necrosis Unsure  Has patient had a PCN reaction that required hospitalization Unsure  Has patient had a PCN reaction occurring within the last 10 years Unsure  If all of the above answers are "NO", then may proceed with Cephalosporin use.  Clementeen Hoof [Iodinated Diagnostic Agents] Rash  . Tetanus Toxoids Rash       . Zetia [Ezetimibe] Rash    Medication list reviewed and updated in full in [place].  GEN: No fevers, chills. Nontoxic. Primarily MSK c/o today. MSK: Detailed in the HPI GI: tolerating PO intake without difficulty Neuro: No numbness, parasthesias, or tingling associated. Otherwise the pertinent positives of the ROS are noted above.   Objective:   Vitals:   07/23/17 1049  BP: 120/70  Pulse: (!) 46  Temp: (!) 97.5 F (36.4 C)  TempSrc: Oral  Weight: 196 lb 8 oz (89.1 kg)  Height: 5\' 1"  (1.549 m)     GEN: WDWN, NAD, Non-toxic, Alert & Oriented x 3 HEENT: Atraumatic, Normocephalic.  Ears and Nose: No external deformity. EXTR: No clubbing/cyanosis/edema NEURO: Normal gait.  PSYCH: Normally interactive. Conversant. Not depressed or anxious appearing.  Calm demeanor.    She  lacks 5 degrees of extension and has flexion to 105 degrees on the left knee.  Stable to varus and valgus stress.  ACL and PCL appear stable.  She does have medial and lateral joint line tenderness.  McMurray's causes some pain as well as flexion pinch.  Radiology: No results found.  Assessment and Plan:   Primary osteoarthritis of left knee - Plan: methylPREDNISolone acetate (DEPO-MEDROL) injection 80 mg  For now, we are just going to inject the patient's left knee.  If her symptoms get worse at all on the right, we could inject her right knee in the future during the winter.  Knee Injection, L Patient verbally consented to procedure. Risks (including potential rare risk of infection), benefits, and alternatives explained. Sterilely prepped with Chloraprep. Ethyl cholride used for anesthesia. 8 cc Lidocaine 1% mixed with 2 mL Depo-Medrol 40 mg injected using the anteromedial approach without difficulty. No complications with procedure and tolerated well. Patient had decreased pain post-injection.   Follow-up: No Follow-up on file.  Future Appointments  Date Time Provider [place]  07/30/2017  1:45 PM Earlie Server, MD [facility] None  07/23/2018 11:15 AM Eustace Pen, LPN [facility]  16/05/9603 11:30 AM Ria Bush, MD [facility]    Meds ordered this encounter  Medications  . methylPREDNISolone acetate (DEPO-MEDROL) injection 80 mg    Signed,  Carlee Vonderhaar T. Sheyli Horwitz, MD   Allergies as of 07/23/2017      Reactions   Risperidone And Related Other (See Comments)   Reaction:  Altered mental status    Penicillins Other (See Comments)   Pt states that this med knocks her out.  Has patient had a PCN reaction causing immediate rash, facial/tongue/throat swelling, SOB or lightheadedness with hypotension Unsure  Has patient had a PCN reaction causing severe rash involving mucus membranes or skin necrosis Unsure  Has patient had a PCN reaction that required  hospitalization Unsure  Has patient had a PCN reaction occurring within the  last 10 years Unsure  If all of the above answers are "NO", then may proceed with Cephalosporin use.   Ivp Dye [iodinated Diagnostic Agents] Rash   Tetanus Toxoids Rash      Zetia [ezetimibe] Rash      Medication List        Accurate as of 07/23/17 11:34 AM. Always use your most recent med list.          ACCU-CHEK AVIVA PLUS test strip Generic drug:  glucose blood USE TO CHECK SUGAR BEFORE EATING BREAKFAST AND AS NEEDED. DX: E11.21   divalproex 500 MG DR tablet Commonly known as:  DEPAKOTE Take 1 tablet (500 mg total) by mouth 2 (two) times daily with a meal.   INVEGA SUSTENNA 234 MG/1.5ML Susp injection Generic drug:  paliperidone Inject 234 mg into the muscle every 30 (thirty) days.   lamoTRIgine 25 MG tablet Commonly known as:  LAMICTAL Take 25 mg by mouth daily.   levothyroxine 25 MCG tablet Commonly known as:  SYNTHROID, LEVOTHROID Take 1 tablet (25 mcg total) daily before breakfast by mouth.   losartan 50 MG tablet Commonly known as:  COZAAR Take 1 tablet (50 mg total) by mouth daily.   MELATONIN PO Take 1 tablet by mouth at bedtime.   metFORMIN 500 MG tablet Commonly known as:  GLUCOPHAGE Take 1 tablet (500 mg total) by mouth daily with breakfast.   metoprolol tartrate 25 MG tablet Commonly known as:  LOPRESSOR TAKE 1 & 1/2 TABLET BY MOUTH TWICE DAILY WITH FOOD   nystatin powder Commonly known as:  MYCOSTATIN/NYSTOP Apply topically 3 (three) times daily.   polyethylene glycol powder powder Commonly known as:  GLYCOLAX/MIRALAX TAKE 17 GRAMS BY MOUTH TWICE DAILY AS NEEDED FOR MODERATE CONSTIPATION.   QUEtiapine 200 MG tablet Commonly known as:  SEROQUEL Take 200 mg by mouth at bedtime.   simvastatin 20 MG tablet Commonly known as:  ZOCOR Take 1 tablet (20 mg total) by mouth at bedtime.

## 2017-07-25 NOTE — Progress Notes (Signed)
I reviewed health advisor's note, was available for consultation, and agree with documentation and plan.  

## 2017-07-27 ENCOUNTER — Encounter: Payer: Medicare Other | Admitting: Oncology

## 2017-07-30 ENCOUNTER — Encounter: Payer: Self-pay | Admitting: Oncology

## 2017-07-30 ENCOUNTER — Inpatient Hospital Stay: Payer: Medicare Other | Attending: Oncology | Admitting: Oncology

## 2017-07-30 ENCOUNTER — Other Ambulatory Visit: Payer: Self-pay | Admitting: Oncology

## 2017-07-30 ENCOUNTER — Inpatient Hospital Stay: Payer: Medicare Other

## 2017-07-30 VITALS — BP 119/77 | HR 52 | Temp 97.7°F | Resp 16 | Wt 195.8 lb

## 2017-07-30 DIAGNOSIS — E631 Imbalance of constituents of food intake: Secondary | ICD-10-CM

## 2017-07-30 DIAGNOSIS — Z7984 Long term (current) use of oral hypoglycemic drugs: Secondary | ICD-10-CM

## 2017-07-30 DIAGNOSIS — K219 Gastro-esophageal reflux disease without esophagitis: Secondary | ICD-10-CM | POA: Insufficient documentation

## 2017-07-30 DIAGNOSIS — Z79899 Other long term (current) drug therapy: Secondary | ICD-10-CM | POA: Insufficient documentation

## 2017-07-30 DIAGNOSIS — N183 Chronic kidney disease, stage 3 (moderate): Secondary | ICD-10-CM | POA: Diagnosis not present

## 2017-07-30 DIAGNOSIS — G4733 Obstructive sleep apnea (adult) (pediatric): Secondary | ICD-10-CM | POA: Diagnosis not present

## 2017-07-30 DIAGNOSIS — E1122 Type 2 diabetes mellitus with diabetic chronic kidney disease: Secondary | ICD-10-CM | POA: Insufficient documentation

## 2017-07-30 DIAGNOSIS — E669 Obesity, unspecified: Secondary | ICD-10-CM | POA: Insufficient documentation

## 2017-07-30 DIAGNOSIS — D696 Thrombocytopenia, unspecified: Secondary | ICD-10-CM | POA: Diagnosis not present

## 2017-07-30 DIAGNOSIS — G8929 Other chronic pain: Secondary | ICD-10-CM | POA: Diagnosis not present

## 2017-07-30 DIAGNOSIS — M545 Low back pain: Secondary | ICD-10-CM | POA: Diagnosis not present

## 2017-07-30 DIAGNOSIS — M1990 Unspecified osteoarthritis, unspecified site: Secondary | ICD-10-CM | POA: Diagnosis not present

## 2017-07-30 DIAGNOSIS — D631 Anemia in chronic kidney disease: Secondary | ICD-10-CM | POA: Diagnosis not present

## 2017-07-30 DIAGNOSIS — Z803 Family history of malignant neoplasm of breast: Secondary | ICD-10-CM | POA: Insufficient documentation

## 2017-07-30 DIAGNOSIS — Z6838 Body mass index (BMI) 38.0-38.9, adult: Secondary | ICD-10-CM | POA: Insufficient documentation

## 2017-07-30 DIAGNOSIS — Z85038 Personal history of other malignant neoplasm of large intestine: Secondary | ICD-10-CM | POA: Diagnosis not present

## 2017-07-30 DIAGNOSIS — Z880 Allergy status to penicillin: Secondary | ICD-10-CM | POA: Insufficient documentation

## 2017-07-30 DIAGNOSIS — F329 Major depressive disorder, single episode, unspecified: Secondary | ICD-10-CM | POA: Insufficient documentation

## 2017-07-30 DIAGNOSIS — Z800 Family history of malignant neoplasm of digestive organs: Secondary | ICD-10-CM

## 2017-07-30 DIAGNOSIS — Z90710 Acquired absence of both cervix and uterus: Secondary | ICD-10-CM | POA: Insufficient documentation

## 2017-07-30 LAB — CBC WITH DIFFERENTIAL/PLATELET
Basophils Absolute: 0 10*3/uL (ref 0–0.1)
Basophils Relative: 0 %
EOS ABS: 0.1 10*3/uL (ref 0–0.7)
EOS PCT: 1 %
HCT: 35.4 % (ref 35.0–47.0)
Hemoglobin: 11.9 g/dL — ABNORMAL LOW (ref 12.0–16.0)
LYMPHS ABS: 2.3 10*3/uL (ref 1.0–3.6)
LYMPHS PCT: 30 %
MCH: 32.1 pg (ref 26.0–34.0)
MCHC: 33.6 g/dL (ref 32.0–36.0)
MCV: 95.6 fL (ref 80.0–100.0)
MONO ABS: 0.6 10*3/uL (ref 0.2–0.9)
MONOS PCT: 8 %
Neutro Abs: 4.5 10*3/uL (ref 1.4–6.5)
Neutrophils Relative %: 61 %
PLATELETS: 125 10*3/uL — AB (ref 150–440)
RBC: 3.7 MIL/uL — ABNORMAL LOW (ref 3.80–5.20)
RDW: 14.3 % (ref 11.5–14.5)
WBC: 7.5 10*3/uL (ref 3.6–11.0)

## 2017-07-30 LAB — LACTATE DEHYDROGENASE: LDH: 144 U/L (ref 98–192)

## 2017-07-30 LAB — COMPREHENSIVE METABOLIC PANEL
ALBUMIN: 4 g/dL (ref 3.5–5.0)
ALT: 24 U/L (ref 14–54)
AST: 26 U/L (ref 15–41)
Alkaline Phosphatase: 77 U/L (ref 38–126)
Anion gap: 9 (ref 5–15)
BUN: 33 mg/dL — AB (ref 6–20)
CHLORIDE: 106 mmol/L (ref 101–111)
CO2: 23 mmol/L (ref 22–32)
CREATININE: 1.55 mg/dL — AB (ref 0.44–1.00)
Calcium: 9.4 mg/dL (ref 8.9–10.3)
GFR calc Af Amer: 39 mL/min — ABNORMAL LOW (ref >60)
GFR, EST NON AFRICAN AMERICAN: 34 mL/min — AB (ref >60)
GLUCOSE: 86 mg/dL (ref 65–99)
Potassium: 5.2 mmol/L — ABNORMAL HIGH (ref 3.5–5.1)
Sodium: 138 mmol/L (ref 135–145)
Total Bilirubin: 0.5 mg/dL (ref 0.3–1.2)
Total Protein: 7.4 g/dL (ref 6.5–8.1)

## 2017-07-30 LAB — TECHNOLOGIST SMEAR REVIEW

## 2017-07-30 NOTE — Progress Notes (Signed)
Hematology/Oncology Consult note [facility] Telephone:([phone] [phone]   Patient Care Team: Ria Bush, MD as PCP - General (Family Medicine)  REFERRING PROVIDER: Ria Bush, MD CHIEF COMPLAINTS/PURPOSE OF CONSULTATION:  Evaluation of thrombocytopenia.   HISTORY OF PRESENTING ILLNESS:  Gina [name] is a  66 y.o.  female with PMH listed below who was referred to me for evaluation of thrombocytopenia. Patient takes Depakote for depression for about 5 years. Denies any active bleeding events. Denies any fever, chills, weight loss, fatigue.  She reports occasional bruising on her upper extremities.  Significant colon cancer family history and also personal history of colon cancer which was resected in 2014. Her most recent colonoscopy was done by Dr.Wohl and showed One 3 mm polyp in the ascending colon, removed with a cold biopsy forceps.  Pathology showed TUBULAR ADENOMA. Patient is not aware if she has ever had genetic testing. She tells me that her depression has been going on and off. She lives by herself. She is single, no children.  Review of Systems  Constitutional: Negative for chills, fever and weight loss.  HENT: Negative for hearing loss.   Eyes: Negative for double vision.  Respiratory: Negative for cough.   Cardiovascular: Negative for palpitations.  Gastrointestinal: Negative for heartburn.  Genitourinary: Negative for dysuria.  Musculoskeletal: Negative for neck pain.  Skin: Negative for rash.  Neurological: Negative for tingling.  Endo/Heme/Allergies: Bruises/bleeds easily.  Psychiatric/Behavioral: Positive for depression.    MEDICAL HISTORY:  Past Medical History:  Diagnosis Date  . Anemia in chronic kidney disease   . Bipolar depression [facility])    sees psychiatrist - psych admission 03/2015  . Bladder cancer ([place]) 1990's  . Blood transfusion without reported diagnosis 2009  . Cataract    left  . CKD  (chronic kidney disease) stage 3, GFR 30-59 ml/min (HCC)   . Colon cancer ([place]) 1990's  . DJD (degenerative joint disease), lumbar    chronic lower back pain  . Enterocutaneous fistula 04/07/2012   completed PT 06/2012 ([facility])  . Family history of breast cancer   . Family history of colon cancer   . Family history of stomach cancer   . GERD (gastroesophageal reflux disease)   . History of bladder cancer 1997  . History of colon cancer    s/p surgery  . History of uterine cancer    s/p hysterectomy  . Hyperlipidemia   . Hypertension   . IDA (iron deficiency anemia) 01/2013   thought due to h/o polyps  . Obesity (BMI 30-39.9)   . OSA on CPAP    6cm H2O  . Personal history of colonic adenomas and colon cancer 11/11/2012  . Positive hepatitis C antibody test 09/2014   but negative confirmatory testing  . RBBB   . Uncontrolled type 2 diabetes mellitus with nephropathy ([place]) 06/07/2014   completed DSME 02/2016    SURGICAL HISTORY: Past Surgical History:  Procedure Laterality Date  . BREAST BIOPSY Right 01/2014   fibroadenoma  . COLONOSCOPY  07/2009  . COLONOSCOPY  11/2012   2 tubular adenomas, mild diverticulosis, pending genetic testing for Lynch syndrome [name]) rpt 2 yrs  . COLONOSCOPY  02/2015   TA, diverticulosis, rpt 2 yrs [name])  . COLONOSCOPY WITH PROPOFOL N/A 06/09/2017   TAx1, rpt 2 yrs [name], [name], MD)  . DEXA  12/2009   WNL  . DOBUTAMINE STRESS ECHO  12/2009   no evidence of ischemia  . HERNIA REPAIR  02/04/12  . i &  d abdominal wound  02/19/12  . LEFT OOPHORECTOMY  2005  . PARTIAL COLECTOMY  about 2008   for colon cancer  . PARTIAL HYSTERECTOMY  1981   uterine cancer, R ovary remains  . Reexploration of abdominal wound and Allograft placemet  02/26/12  . Removal of infected mesh and abdominal wound vac placement  02/24/12  . sleep study  02/2014   OSA - AHI 55, nadir 81% [name])    SOCIAL HISTORY: Social History   Socioeconomic History  . Marital  status: Single    Spouse name: Not on file  . Number of children: 0  . Years of education: Not on file  . Highest education level: Not on file  Social Needs  . Financial resource strain: Not on file  . Food insecurity - worry: Not on file  . Food insecurity - inability: Not on file  . Transportation needs - medical: Not on file  . Transportation needs - non-medical: Not on file  Occupational History  . Not on file  Tobacco Use  . Smoking status: Never Smoker  . Smokeless tobacco: Never Used  Substance and Sexual Activity  . Alcohol use: No    Alcohol/week: 0.0 oz  . Drug use: No  . Sexual activity: No  Other Topics Concern  . Not on file  Social History Narrative   Lives with sister, no pets   Occupation: disabled, for bipolar and arthritis   Edu: GED   Activity: take walks   Diet: good water, vegetables daily   Religion: [place], Dr. Jimmye Norman (ph [phone])    FAMILY HISTORY: Family History  Problem Relation Age of Onset  . Colon cancer Mother 42  . Stomach cancer Mother        dx in her 22s?  Marland Kitchen Colon cancer Sister 24       Maternal half sister  . CAD Father        MI  . Mental illness Sister        anxiety/depression  . Thyroid disease Sister   . Breast cancer Maternal Grandmother   . Diabetes Brother   . Diabetes Brother   . Diabetes Other        aunts and uncles both sides  . Arthritis Other        strong fmhx  . Esophageal cancer Neg Hx   . Rectal cancer Neg Hx     ALLERGIES:  is allergic to risperidone and related; penicillins; ivp dye [iodinated diagnostic agents]; tetanus toxoids; and zetia [ezetimibe].  MEDICATIONS:  Current Outpatient Medications  Medication Sig Dispense Refill  . ACCU-CHEK AVIVA PLUS test strip USE TO CHECK SUGAR BEFORE EATING BREAKFAST AND AS NEEDED. DX: E11.21  3  . divalproex (DEPAKOTE) 500 MG DR tablet Take 1 tablet (500 mg total) by mouth 2 (two) times daily with a meal. 60 tablet  3  . lamoTRIgine (LAMICTAL) 25 MG tablet Take 25 mg by mouth daily.    Marland Kitchen levothyroxine (SYNTHROID, LEVOTHROID) 25 MCG tablet Take 1 tablet (25 mcg total) daily before breakfast by mouth. 30 tablet 3  . losartan (COZAAR) 50 MG tablet Take 1 tablet (50 mg total) by mouth daily. 30 tablet 11  . MELATONIN PO Take 1 tablet by mouth at bedtime.    . metFORMIN (GLUCOPHAGE) 500 MG tablet Take 1 tablet (500 mg total) by mouth daily with breakfast. 30 tablet 6  . metoprolol tartrate (LOPRESSOR) 25  MG tablet TAKE 1 & 1/2 TABLET BY MOUTH TWICE DAILY WITH FOOD 84 tablet 6  . nystatin (MYCOSTATIN/NYSTOP) powder Apply topically 3 (three) times daily. 60 g 1  . paliperidone (INVEGA SUSTENNA) 234 MG/1.5ML SUSP injection Inject 234 mg into the muscle every 30 (thirty) days.    . polyethylene glycol powder (GLYCOLAX/MIRALAX) powder TAKE 17 GRAMS BY MOUTH TWICE DAILY AS NEEDED FOR MODERATE CONSTIPATION. 527 g 3  . QUEtiapine (SEROQUEL) 200 MG tablet Take 200 mg by mouth at bedtime.    . simvastatin (ZOCOR) 20 MG tablet Take 1 tablet (20 mg total) by mouth at bedtime. 30 tablet 11   No current facility-administered medications for this visit.      PHYSICAL EXAMINATION: ECOG PERFORMANCE STATUS: 0 - Asymptomatic Vitals:   07/30/17 1404  BP: 119/77  Pulse: (!) 52  Resp: 16  Temp: 97.7 F (36.5 C)   Filed Weights   07/30/17 1404  Weight: 195 lb 12.8 oz (88.8 kg)    Physical Exam  Constitutional: She is oriented to person, place, and time and well-developed, well-nourished, and in no distress. No distress.  HENT:  Head: Normocephalic and atraumatic.  Mouth/Throat: No oropharyngeal exudate.  Eyes: Conjunctivae and EOM are normal. Pupils are equal, round, and reactive to light.  Neck: Normal range of motion. Neck supple.  Cardiovascular: Normal rate, regular rhythm and normal heart sounds.  No murmur heard. Pulmonary/Chest: Effort normal and breath sounds normal. She exhibits no tenderness.  Abdominal:  Soft. Bowel sounds are normal. She exhibits no distension. There is no tenderness.  Musculoskeletal: Normal range of motion. She exhibits no edema.  Lymphadenopathy:    She has no cervical adenopathy.  Neurological: She is alert and oriented to person, place, and time.  Skin: Skin is warm and dry.  Psychiatric: Affect normal.     LABORATORY DATA:  I have reviewed the data as listed Lab Results  Component Value Date   WBC 7.5 07/30/2017   HGB 11.9 (L) 07/30/2017   HCT 35.4 07/30/2017   MCV 95.6 07/30/2017   PLT 125 (L) 07/30/2017   Recent Labs    04/11/17 1558 04/13/17 0041 06/01/17 1005 06/24/17 07/03/17 1143 07/30/17 1519  NA 139 137 140 142 139 138  K 4.5 4.7 4.6 5.4* 5.2* 5.2*  CL 104 103 103  --  105 106  CO2 23 25 28   --  26 23  GLUCOSE 125* 170* 124*  --  117* 86  BUN 28* 31* 27* 28* 37* 33*  CREATININE 1.54* 1.57* 1.51* 1.7* 1.66* 1.55*  CALCIUM 10.0 8.7* 9.8  --  9.7 9.4  GFRNONAA 34* 33*  --   --   --  34*  GFRAA 39* 39*  --   --   --  39*  PROT 8.3*  --   --   --   --  7.4  ALBUMIN 4.2  --  4.2  --  4.0 4.0  AST 26  --   --  22  --  26  ALT 22  --   --  18  --  24  ALKPHOS 72  --   --  80  --  77  BILITOT 0.6  --   --   --   --  0.5       ASSESSMENT & PLAN:  1. Thrombocytopenia ([place])    For the work up of patient's thrombocytopenia, I recommend checking CBC;CMP, LDH; pathology smear review, folate, Vitamin B12, hepatitis, HIV and flow  cytometry. Will also check ultrasound of the abdomen.  Also, discussed with the patient that if no clear etiology found- bone marrow biopsy would be suggested. Currently await for the above workup.   # Patient follow-up with me in approximately 10 days to review the above results. # will discuss about genetic testing.   All questions were answered. The patient knows to call the clinic with any problems questions or concerns.  Return of visit: 10 days to discuss results. Thank you for this kind referral and the  opportunity to participate in the care of this patient. A copy of today's note is routed to referring provider    Earlie Server, MD, PhD Hematology Oncology [facility] at [facility] Pager- [phone] 07/30/2017

## 2017-07-31 DIAGNOSIS — G4733 Obstructive sleep apnea (adult) (pediatric): Secondary | ICD-10-CM | POA: Diagnosis not present

## 2017-07-31 LAB — HIV ANTIBODY (ROUTINE TESTING W REFLEX): HIV Screen 4th Generation wRfx: NONREACTIVE

## 2017-07-31 LAB — HEPATITIS PANEL, ACUTE
HCV AB: 0.3 {s_co_ratio} (ref 0.0–0.9)
Hep A IgM: NEGATIVE
Hep B C IgM: NEGATIVE
Hepatitis B Surface Ag: NEGATIVE

## 2017-08-03 LAB — COMP PANEL: LEUKEMIA/LYMPHOMA

## 2017-08-07 ENCOUNTER — Ambulatory Visit
Admission: RE | Admit: 2017-08-07 | Discharge: 2017-08-07 | Disposition: A | Discharge status: Home / Self Care | Payer: Medicare Other | Source: Ambulatory Visit | Attending: Oncology | Admitting: Oncology

## 2017-08-07 DIAGNOSIS — N281 Cyst of kidney, acquired: Secondary | ICD-10-CM | POA: Insufficient documentation

## 2017-08-07 DIAGNOSIS — K824 Cholesterolosis of gallbladder: Secondary | ICD-10-CM | POA: Diagnosis not present

## 2017-08-07 DIAGNOSIS — D696 Thrombocytopenia, unspecified: Secondary | ICD-10-CM | POA: Insufficient documentation

## 2017-08-09 NOTE — Progress Notes (Signed)
Hematology/Oncology Consult note [facility] [phone] Fax:([phone]   Patient Care Team: Ria Bush, MD as PCP - General (Family Medicine)  REFERRING PROVIDER: Ria Bush, MD REASON FOR VISIT Follow up for treatment of thrombocytopenia.    HISTORY OF PRESENTING ILLNESS:  Gina [name] is a  67 y.o.  female with PMH listed below who was referred to me for evaluation of thrombocytopenia. Patient takes Depakote for depression for about 5 years. Denies any active bleeding events. Denies any fever, chills, weight loss, fatigue.  She reports occasional bruising on her upper extremities.  Significant colon cancer family history and also personal history of colon cancer which was resected in 2014. Her most recent colonoscopy was done by Dr.Wohl and showed One 3 mm polyp in the ascending colon, removed with a cold biopsy forceps.  Pathology showed TUBULAR ADENOMA. Patient also has a personal history of uterine cancer in the bladder cancer. Patient is not aware if she has ever had genetic testing. She tells me that her depression has been going on and off. She lives by herself. She is single, no children.  INTERVAL HISTORY Gina [name] is a 67 y.o. female who has above history reviewed by me today presents for follow up visit for management of thrombocytopenia. Patient is accompanied by one of her front.   Review of Systems  Constitutional: Negative for chills, fever and weight loss.  HENT: Negative for hearing loss.   Eyes: Negative for double vision.  Respiratory: Negative for cough.   Cardiovascular: Negative for palpitations.  Gastrointestinal: Negative for heartburn.  Genitourinary: Negative for dysuria.  Musculoskeletal: Negative for neck pain.  Skin: Negative for rash.  Neurological: Negative for tingling.  Endo/Heme/Allergies: Does not bruise/bleed easily.  Psychiatric/Behavioral: Positive for depression.     MEDICAL HISTORY:  Past Medical History:  Diagnosis Date  . Anemia in chronic kidney disease   . Bipolar depression [facility])    sees psychiatrist - psych admission 03/2015  . Bladder cancer ([place]) 1990's  . Blood transfusion without reported diagnosis 2009  . Cataract    left  . CKD (chronic kidney disease) stage 3, GFR 30-59 ml/min (HCC)   . Colon cancer ([place]) 1990's  . DJD (degenerative joint disease), lumbar    chronic lower back pain  . Enterocutaneous fistula 04/07/2012   completed PT 06/2012 ([facility])  . Family history of breast cancer   . Family history of colon cancer   . Family history of stomach cancer   . GERD (gastroesophageal reflux disease)   . History of bladder cancer 1997  . History of colon cancer    s/p surgery  . History of uterine cancer    s/p hysterectomy  . Hyperlipidemia   . Hypertension   . IDA (iron deficiency anemia) 01/2013   thought due to h/o polyps  . Obesity (BMI 30-39.9)   . OSA on CPAP    6cm H2O  . Personal history of colonic adenomas and colon cancer 11/11/2012  . Positive hepatitis C antibody test 09/2014   but negative confirmatory testing  . RBBB   . Uncontrolled type 2 diabetes mellitus with nephropathy ([place]) 06/07/2014   completed DSME 02/2016    SURGICAL HISTORY: Past Surgical History:  Procedure Laterality Date  . BREAST BIOPSY Right 01/2014   fibroadenoma  . COLONOSCOPY  07/2009  . COLONOSCOPY  11/2012   2 tubular adenomas, mild diverticulosis, pending genetic testing for Lynch syndrome [name]) rpt 2 yrs  . COLONOSCOPY  02/2015  TA, diverticulosis, rpt 2 yrs [name])  . COLONOSCOPY WITH PROPOFOL N/A 06/09/2017   TAx1, rpt 2 yrs [name], [name], MD)  . DEXA  12/2009   WNL  . DOBUTAMINE STRESS ECHO  12/2009   no evidence of ischemia  . HERNIA REPAIR  02/04/12  . i & d abdominal wound  02/19/12  . LEFT OOPHORECTOMY  2005  . PARTIAL COLECTOMY  about 2008   for colon cancer  . PARTIAL HYSTERECTOMY  1981   uterine cancer, R  ovary remains  . Reexploration of abdominal wound and Allograft placemet  02/26/12  . Removal of infected mesh and abdominal wound vac placement  02/24/12  . sleep study  02/2014   OSA - AHI 55, nadir 81% [name])    SOCIAL HISTORY: Social History   Socioeconomic History  . Marital status: Single    Spouse name: Not on file  . Number of children: 0  . Years of education: Not on file  . Highest education level: Not on file  Social Needs  . Financial resource strain: Not on file  . Food insecurity - worry: Not on file  . Food insecurity - inability: Not on file  . Transportation needs - medical: Not on file  . Transportation needs - non-medical: Not on file  Occupational History  . Not on file  Tobacco Use  . Smoking status: Never Smoker  . Smokeless tobacco: Never Used  Substance and Sexual Activity  . Alcohol use: No    Alcohol/week: 0.0 oz  . Drug use: No  . Sexual activity: No  Other Topics Concern  . Not on file  Social History Narrative   Lives with sister, no pets   Occupation: disabled, for bipolar and arthritis   Edu: GED   Activity: take walks   Diet: good water, vegetables daily   Religion: [place], Dr. Jimmye Norman (ph [phone])    FAMILY HISTORY: Family History  Problem Relation Age of Onset  . Colon cancer Mother 64  . Stomach cancer Mother        dx in her 57s?  Marland Kitchen Colon cancer Sister 11       Maternal half sister  . CAD Father        MI  . Mental illness Sister        anxiety/depression  . Thyroid disease Sister   . Breast cancer Maternal Grandmother   . Diabetes Brother   . Diabetes Brother   . Diabetes Other        aunts and uncles both sides  . Arthritis Other        strong fmhx  . Esophageal cancer Neg Hx   . Rectal cancer Neg Hx     ALLERGIES:  is allergic to risperidone and related; penicillins; ivp dye [iodinated diagnostic agents]; tetanus toxoids; and zetia [ezetimibe].  MEDICATIONS:   Current Outpatient Medications  Medication Sig Dispense Refill  . ACCU-CHEK AVIVA PLUS test strip USE TO CHECK SUGAR BEFORE EATING BREAKFAST AND AS NEEDED. DX: E11.21  3  . divalproex (DEPAKOTE) 500 MG DR tablet Take 1 tablet (500 mg total) by mouth 2 (two) times daily with a meal. 60 tablet 3  . lamoTRIgine (LAMICTAL) 25 MG tablet Take 25 mg by mouth daily.    Marland Kitchen levothyroxine (SYNTHROID, LEVOTHROID) 25 MCG tablet Take 1 tablet (25 mcg total) daily before breakfast by mouth. 30 tablet 3  . losartan (COZAAR) 50 MG  tablet Take 1 tablet (50 mg total) by mouth daily. 30 tablet 11  . MELATONIN PO Take 1 tablet by mouth at bedtime.    . metFORMIN (GLUCOPHAGE) 500 MG tablet Take 1 tablet (500 mg total) by mouth daily with breakfast. 30 tablet 6  . metoprolol tartrate (LOPRESSOR) 25 MG tablet TAKE 1 & 1/2 TABLET BY MOUTH TWICE DAILY WITH FOOD 84 tablet 6  . nystatin (MYCOSTATIN/NYSTOP) powder Apply topically 3 (three) times daily. 60 g 1  . paliperidone (INVEGA SUSTENNA) 234 MG/1.5ML SUSP injection Inject 234 mg into the muscle every 30 (thirty) days.    . polyethylene glycol powder (GLYCOLAX/MIRALAX) powder TAKE 17 GRAMS BY MOUTH TWICE DAILY AS NEEDED FOR MODERATE CONSTIPATION. 527 g 3  . QUEtiapine (SEROQUEL) 200 MG tablet Take 200 mg by mouth at bedtime.    . simvastatin (ZOCOR) 20 MG tablet Take 1 tablet (20 mg total) by mouth at bedtime. 30 tablet 11   No current facility-administered medications for this visit.      PHYSICAL EXAMINATION: ECOG PERFORMANCE STATUS: 0 - Asymptomatic Vitals:   08/10/17 1140 08/10/17 1145  BP:  124/79  Pulse:  65  Resp: 16   Temp:  (!) 97.2 F (36.2 C)   There were no vitals filed for this visit.  Physical Exam  Constitutional: She is oriented to person, place, and time and well-developed, well-nourished, and in no distress. No distress.  HENT:  Head: Normocephalic and atraumatic.  Mouth/Throat: No oropharyngeal exudate.  Eyes: Conjunctivae and EOM are  normal. Pupils are equal, round, and reactive to light. Left eye exhibits no discharge.  Neck: Normal range of motion. Neck supple.  Cardiovascular: Normal rate, regular rhythm and normal heart sounds. Exam reveals no friction rub.  No murmur heard. Pulmonary/Chest: Effort normal and breath sounds normal. She exhibits no tenderness.  Abdominal: Soft. Bowel sounds are normal. She exhibits no distension. There is no tenderness. There is no rebound.  Musculoskeletal: Normal range of motion. She exhibits no edema.  Lymphadenopathy:    She has no cervical adenopathy.  Neurological: She is alert and oriented to person, place, and time. No cranial nerve deficit.  Skin: Skin is warm and dry. No erythema.  Psychiatric: Affect normal.     LABORATORY DATA:  I have reviewed the data as listed Lab Results  Component Value Date   WBC 7.5 07/30/2017   HGB 11.9 (L) 07/30/2017   HCT 35.4 07/30/2017   MCV 95.6 07/30/2017   PLT 125 (L) 07/30/2017   Recent Labs    04/11/17 1558 04/13/17 0041 06/01/17 1005 06/24/17 07/03/17 1143 07/30/17 1519  NA 139 137 140 142 139 138  K 4.5 4.7 4.6 5.4* 5.2* 5.2*  CL 104 103 103  --  105 106  CO2 23 25 28   --  26 23  GLUCOSE 125* 170* 124*  --  117* 86  BUN 28* 31* 27* 28* 37* 33*  CREATININE 1.54* 1.57* 1.51* 1.7* 1.66* 1.55*  CALCIUM 10.0 8.7* 9.8  --  9.7 9.4  GFRNONAA 34* 33*  --   --   --  34*  GFRAA 39* 39*  --   --   --  39*  PROT 8.3*  --   --   --   --  7.4  ALBUMIN 4.2  --  4.2  --  4.0 4.0  AST 26  --   --  22  --  26  ALT 22  --   --  18  --  24  ALKPHOS 72  --   --  80  --  77  BILITOT 0.6  --   --   --   --  0.5       ASSESSMENT & PLAN:  1. Thrombocytopenia ([place])   2. Anemia, unspecified type   3. Gallbladder polyp   4. Family history malignant neoplasm of biliary tract   5. History of bladder cancer   6. Personal history of colon cancer   7. History of uterine cancer    For thrombocytopenia, just normal folate, B12 level.  Normal LDH, negative flow cytometry, negative hepatitis and HIV. Ultrasound abdomen showed normal spleen size. Possible fatty infiltration of liver. Not explaining her thrombocytopenia. Review her medication shows she is being on Depakote, Which can potentially cause bone marrow suppression. She doesn't have any active bleeding and her counts has been stable.Burnis Medin recheck Depakote level today.   # She appears to have mild anemia today. Given her chronic kidney disease will check a multiple myeloma workup.  # will discuss about genetic testing. Referred to genetic counseling. Patient in agreement. # Gallbladder polyp, asked patient to follow up was primary care physician to have annual ultrasound surveillance. Patient voices understanding. All questions were answered. The patient knows to call the clinic with any problems questions or concerns.  Return of visit: If all tests normal, she can follow up in 6 months.   Earlie Server, MD, PhD Hematology Oncology [facility] at [facility] Pager- [phone] 08/09/2017

## 2017-08-10 ENCOUNTER — Encounter: Payer: Self-pay | Admitting: Oncology

## 2017-08-10 ENCOUNTER — Other Ambulatory Visit: Payer: Self-pay

## 2017-08-10 ENCOUNTER — Telehealth: Payer: Self-pay | Admitting: Genetic Counselor

## 2017-08-10 ENCOUNTER — Inpatient Hospital Stay: Payer: Medicare Other

## 2017-08-10 ENCOUNTER — Inpatient Hospital Stay: Payer: Medicare Other | Attending: Oncology | Admitting: Oncology

## 2017-08-10 VITALS — BP 124/79 | HR 65 | Temp 97.2°F | Resp 16 | Ht 61.0 in | Wt 196.7 lb

## 2017-08-10 DIAGNOSIS — Z8551 Personal history of malignant neoplasm of bladder: Secondary | ICD-10-CM

## 2017-08-10 DIAGNOSIS — N183 Chronic kidney disease, stage 3 (moderate): Secondary | ICD-10-CM | POA: Insufficient documentation

## 2017-08-10 DIAGNOSIS — Z880 Allergy status to penicillin: Secondary | ICD-10-CM | POA: Insufficient documentation

## 2017-08-10 DIAGNOSIS — Z7984 Long term (current) use of oral hypoglycemic drugs: Secondary | ICD-10-CM | POA: Insufficient documentation

## 2017-08-10 DIAGNOSIS — D649 Anemia, unspecified: Secondary | ICD-10-CM

## 2017-08-10 DIAGNOSIS — D696 Thrombocytopenia, unspecified: Secondary | ICD-10-CM

## 2017-08-10 DIAGNOSIS — E785 Hyperlipidemia, unspecified: Secondary | ICD-10-CM | POA: Insufficient documentation

## 2017-08-10 DIAGNOSIS — K219 Gastro-esophageal reflux disease without esophagitis: Secondary | ICD-10-CM | POA: Diagnosis not present

## 2017-08-10 DIAGNOSIS — M1990 Unspecified osteoarthritis, unspecified site: Secondary | ICD-10-CM

## 2017-08-10 DIAGNOSIS — Z800 Family history of malignant neoplasm of digestive organs: Secondary | ICD-10-CM | POA: Diagnosis not present

## 2017-08-10 DIAGNOSIS — F319 Bipolar disorder, unspecified: Secondary | ICD-10-CM | POA: Insufficient documentation

## 2017-08-10 DIAGNOSIS — Z79899 Other long term (current) drug therapy: Secondary | ICD-10-CM | POA: Insufficient documentation

## 2017-08-10 DIAGNOSIS — F329 Major depressive disorder, single episode, unspecified: Secondary | ICD-10-CM | POA: Diagnosis not present

## 2017-08-10 DIAGNOSIS — K824 Cholesterolosis of gallbladder: Secondary | ICD-10-CM

## 2017-08-10 DIAGNOSIS — D631 Anemia in chronic kidney disease: Secondary | ICD-10-CM | POA: Diagnosis not present

## 2017-08-10 DIAGNOSIS — Z8542 Personal history of malignant neoplasm of other parts of uterus: Secondary | ICD-10-CM | POA: Diagnosis not present

## 2017-08-10 DIAGNOSIS — E119 Type 2 diabetes mellitus without complications: Secondary | ICD-10-CM

## 2017-08-10 DIAGNOSIS — I10 Essential (primary) hypertension: Secondary | ICD-10-CM | POA: Insufficient documentation

## 2017-08-10 DIAGNOSIS — Z90710 Acquired absence of both cervix and uterus: Secondary | ICD-10-CM | POA: Insufficient documentation

## 2017-08-10 DIAGNOSIS — Z85038 Personal history of other malignant neoplasm of large intestine: Secondary | ICD-10-CM

## 2017-08-10 LAB — IRON AND TIBC
IRON: 79 ug/dL (ref 28–170)
Saturation Ratios: 20 % (ref 10.4–31.8)
TIBC: 402 ug/dL (ref 250–450)
UIBC: 323 ug/dL

## 2017-08-10 LAB — FERRITIN: FERRITIN: 67 ng/mL (ref 11–307)

## 2017-08-10 LAB — VALPROIC ACID LEVEL: VALPROIC ACID LVL: 60 ug/mL (ref 50.0–100.0)

## 2017-08-10 NOTE — Telephone Encounter (Signed)
Ms. Simon was referred for genetic counseling by Dr. Tasia Catchings due to a personal and family history of cancer. I spoke with her to schedule this telegenetics visit to be done by phone at her convenience and she chose an appointment on Tuesday, 08/11/17 at [place], [place], [facility] Genetic Counselor Phone: [phone]

## 2017-08-10 NOTE — Progress Notes (Signed)
Patient here for follow up. She has been sleepier than usual.

## 2017-08-11 ENCOUNTER — Telehealth: Payer: Self-pay | Admitting: Genetic Counselor

## 2017-08-11 ENCOUNTER — Encounter: Payer: Self-pay | Admitting: Genetic Counselor

## 2017-08-11 LAB — KAPPA/LAMBDA LIGHT CHAINS
KAPPA FREE LGHT CHN: 32.1 mg/L — AB (ref 3.3–19.4)
KAPPA, LAMDA LIGHT CHAIN RATIO: 1.29 (ref 0.26–1.65)
Lambda free light chains: 24.9 mg/L (ref 5.7–26.3)

## 2017-08-11 LAB — PROTEIN ELECTROPHORESIS, SERUM
A/G Ratio: 1.2 (ref 0.7–1.7)
ALBUMIN ELP: 3.6 g/dL (ref 2.9–4.4)
ALPHA-1-GLOBULIN: 0.2 g/dL (ref 0.0–0.4)
Alpha-2-Globulin: 0.8 g/dL (ref 0.4–1.0)
Beta Globulin: 1.2 g/dL (ref 0.7–1.3)
GAMMA GLOBULIN: 0.7 g/dL (ref 0.4–1.8)
GLOBULIN, TOTAL: 3 g/dL (ref 2.2–3.9)
TOTAL PROTEIN ELP: 6.6 g/dL (ref 6.0–8.5)

## 2017-08-11 LAB — IMMUNOFIXATION ELECTROPHORESIS
IGA: 150 mg/dL (ref 87–352)
IGG (IMMUNOGLOBIN G), SERUM: 788 mg/dL (ref 700–1600)
IGM (IMMUNOGLOBULIN M), SRM: 77 mg/dL (ref 26–217)
Total Protein ELP: 6.7 g/dL (ref 6.0–8.5)

## 2017-08-11 NOTE — Telephone Encounter (Signed)
Cancer Genetics            Telegenetics Initial Visit    Patient Name: Gina [name] Patient DOB: Dec 03, 1950 Patient Age: 67 y.o. Phone Call Date: 08/11/2017  Referring Provider: Earlie Server, MD  Reason for Visit: Evaluate for hereditary susceptibility to cancer    Assessment and Plan:  . Gina [name]'s reported history is highly suggestive of a hereditary predisposition to cancer, but it is uncertain whether the reported history is accurate.   . She previously had genetic testing in 2016 that included analysis of 19 genes associated with a hereditary predisposition to colorectal cancers (including analysis of the Lynch genes). Results were normal.   . We discussed that there is additional genetic testing that can be done, but it would be of very low yield. She declined additional testing at this time and this is a reasonable decision.   . Gina [name] is encouraged to remain in contact with Cancer Genetics annually so that we can update the family history and inform her of any changes in cancer genetics and testing that may be of benefit for this family.    Dr. Grayland Ormond was available for questions concerning this case. Total time spent by counseling by phone was approximately 25 minutes.   _____________________________________________________________________   History of Present Illness: Gina [name], a 67 y.o. female, was referred for genetic counseling to discuss the possibility of a hereditary predisposition to cancer and discuss whether genetic testing is warranted. This was a telegenetics visit via phone.  Gina [name] reports a personal history of 3 separate primaries diagnosed while she was living in [place], [place] 1) Uterine cancer in her late 20s/early 22s. She said she had surgery and one ovary was left intact.  2) Bladder cancer ~1997 3) Colon cancer ~2008 for which she had a partial colectomy.  Her recollection of her own medical  history is somewhat confusing and questionable and there is no documentation available to verify it.  She had genetic testing facilitated by genetic counselor [name] at [facility] in [place] (see notes in [place] dated 02/28/15 and 03/22/15). At that time, a colorectal cancer gene panel was ordered through [facility] which included analysis of 19 genes: APC, ATM, AXIN2, BMPR1A, CDH1, CHEK2, EPCAM, MLH1, MSH2, MSH6, MUTYH, PMS2, POLD1, POLE, PTEN, SCG5/GREM1, SMAD4, STK11, and TP53. Genetic testing was normal, and did not reveal a pathogenic mutation in these genes.   Past Medical History:  Diagnosis Date  . Anemia in chronic kidney disease   . Bipolar depression [facility])    sees psychiatrist - psych admission 03/2015  . Bladder cancer ([place]) 1990's  . Blood transfusion without reported diagnosis 2009  . Cataract    left  . CKD (chronic kidney disease) stage 3, GFR 30-59 ml/min (HCC)   . Colon cancer ([place]) 1990's  . DJD (degenerative joint disease), lumbar    chronic lower back pain  . Enterocutaneous fistula 04/07/2012   completed PT 06/2012 ([facility])  . Family history of breast cancer   . Family history of colon cancer   . Family history of stomach cancer   . GERD (gastroesophageal reflux disease)   . History of bladder cancer 1997  . History of colon cancer    s/p surgery  . History of uterine cancer    s/p hysterectomy  . Hyperlipidemia   . Hypertension   . IDA (iron deficiency anemia) 01/2013   thought due  to h/o polyps  . Obesity (BMI 30-39.9)   . OSA on CPAP    6cm H2O  . Personal history of colonic adenomas and colon cancer 11/11/2012  . Positive hepatitis C antibody test 09/2014   but negative confirmatory testing  . RBBB   . Uncontrolled type 2 diabetes mellitus with nephropathy ([place]) 06/07/2014   completed DSME 02/2016    Past Surgical History:  Procedure Laterality Date  . BREAST BIOPSY Right 01/2014   fibroadenoma  . COLONOSCOPY  07/2009  . COLONOSCOPY  11/2012   2  tubular adenomas, mild diverticulosis, pending genetic testing for Lynch syndrome [name]) rpt 2 yrs  . COLONOSCOPY  02/2015   TA, diverticulosis, rpt 2 yrs [name])  . COLONOSCOPY WITH PROPOFOL N/A 06/09/2017   TAx1, rpt 2 yrs [name], [name], MD)  . DEXA  12/2009   WNL  . DOBUTAMINE STRESS ECHO  12/2009   no evidence of ischemia  . HERNIA REPAIR  02/04/12  . i & d abdominal wound  02/19/12  . LEFT OOPHORECTOMY  2005  . PARTIAL COLECTOMY  about 2008   for colon cancer  . PARTIAL HYSTERECTOMY  1981   uterine cancer, R ovary remains  . Reexploration of abdominal wound and Allograft placemet  02/26/12  . Removal of infected mesh and abdominal wound vac placement  02/24/12  . sleep study  02/2014   OSA - AHI 55, nadir 81% [name])    Family History: Significant diagnoses include the following:  Family History  Problem Relation Age of Onset  . Colon cancer Mother 67  . Stomach cancer Mother        dx in her 61s?  . CAD Father        MI; deceased 68  . Mental illness Sister        anxiety/depression  . Thyroid disease Sister   . Breast cancer Maternal Grandmother        age at dx unknown  . Diabetes Brother   . Diabetes Brother   . Diabetes Other        aunts and uncles both sides  . Arthritis Other        strong fmhx  . Colon cancer Other 51       maternal half-sister; deceased    Additionally, Gina [name] has no children. She reportedly has a brother and sister as well as a paternal half-sister (noted above). Her mother had 7 siblings. Her father had 11 siblings. She knows very little about her family history.  Gina [name] ancestry is Caucasian - NOS. There is no known Jewish ancestry and no consanguinity.  Discussion: We reviewed the characteristics, features and inheritance patterns of hereditary cancer syndromes. We discussed her risk of harboring a mutation in the context of her personal and family history. We discussed that her limited knowledge of her family  history makes risk assessment challenging. We discussed that she already had genetic testing done in 2016 and that additional testing is available, but of low yield.  Gina [name] questions were answered to her satisfaction today and she is welcome to call with any additional questions or concerns. Thank you for the referral and allowing [place] to share in the care of your patient.    Steele Berg, MS, [place] Certified Genetic Counselor phone: [phone]

## 2017-08-24 ENCOUNTER — Other Ambulatory Visit: Payer: Self-pay | Admitting: Family Medicine

## 2017-08-26 DIAGNOSIS — Z800 Family history of malignant neoplasm of digestive organs: Secondary | ICD-10-CM | POA: Diagnosis not present

## 2017-08-26 DIAGNOSIS — Z803 Family history of malignant neoplasm of breast: Secondary | ICD-10-CM | POA: Diagnosis not present

## 2017-08-26 DIAGNOSIS — Z85038 Personal history of other malignant neoplasm of large intestine: Secondary | ICD-10-CM | POA: Diagnosis not present

## 2017-09-07 DIAGNOSIS — H524 Presbyopia: Secondary | ICD-10-CM | POA: Diagnosis not present

## 2017-09-07 DIAGNOSIS — H25013 Cortical age-related cataract, bilateral: Secondary | ICD-10-CM | POA: Diagnosis not present

## 2017-09-07 DIAGNOSIS — H2513 Age-related nuclear cataract, bilateral: Secondary | ICD-10-CM | POA: Diagnosis not present

## 2017-09-07 DIAGNOSIS — E119 Type 2 diabetes mellitus without complications: Secondary | ICD-10-CM | POA: Diagnosis not present

## 2017-09-07 DIAGNOSIS — H5213 Myopia, bilateral: Secondary | ICD-10-CM | POA: Diagnosis not present

## 2017-09-16 ENCOUNTER — Other Ambulatory Visit: Payer: Self-pay | Admitting: Family Medicine

## 2017-09-17 ENCOUNTER — Other Ambulatory Visit: Payer: Self-pay | Admitting: Family Medicine

## 2017-09-18 MED ORDER — ACCU-CHEK AVIVA PLUS VI STRP: 1.0000 | ORAL_STRIP | Freq: Every day | 3 refills | Status: DC

## 2017-09-18 MED ORDER — ACCU-CHEK AVIVA PLUS VI STRP: 1.0000 | ORAL_STRIP | Freq: Every day | 3 refills | Status: AC

## 2017-09-18 NOTE — Addendum Note (Signed)
Addended by: Ria Bush on: 09/18/2017 10:03 AM   Modules accepted: Orders

## 2017-09-27 ENCOUNTER — Other Ambulatory Visit: Payer: Self-pay | Admitting: Family Medicine

## 2017-09-27 DIAGNOSIS — D696 Thrombocytopenia, unspecified: Secondary | ICD-10-CM

## 2017-09-27 DIAGNOSIS — D631 Anemia in chronic kidney disease: Secondary | ICD-10-CM

## 2017-09-27 DIAGNOSIS — M1A0710 Idiopathic chronic gout, right ankle and foot, without tophus (tophi): Secondary | ICD-10-CM

## 2017-09-27 DIAGNOSIS — E1121 Type 2 diabetes mellitus with diabetic nephropathy: Secondary | ICD-10-CM

## 2017-09-27 DIAGNOSIS — K7581 Nonalcoholic steatohepatitis (NASH): Secondary | ICD-10-CM

## 2017-09-27 DIAGNOSIS — E039 Hypothyroidism, unspecified: Secondary | ICD-10-CM

## 2017-09-27 DIAGNOSIS — N183 Chronic kidney disease, stage 3 unspecified: Secondary | ICD-10-CM

## 2017-09-27 DIAGNOSIS — E785 Hyperlipidemia, unspecified: Secondary | ICD-10-CM

## 2017-09-30 ENCOUNTER — Other Ambulatory Visit (INDEPENDENT_AMBULATORY_CARE_PROVIDER_SITE_OTHER): Payer: Medicare Other

## 2017-09-30 DIAGNOSIS — N183 Chronic kidney disease, stage 3 unspecified: Secondary | ICD-10-CM

## 2017-09-30 DIAGNOSIS — E785 Hyperlipidemia, unspecified: Secondary | ICD-10-CM

## 2017-09-30 DIAGNOSIS — K7581 Nonalcoholic steatohepatitis (NASH): Secondary | ICD-10-CM | POA: Diagnosis not present

## 2017-09-30 DIAGNOSIS — D696 Thrombocytopenia, unspecified: Secondary | ICD-10-CM | POA: Diagnosis not present

## 2017-09-30 DIAGNOSIS — D631 Anemia in chronic kidney disease: Secondary | ICD-10-CM

## 2017-09-30 DIAGNOSIS — E1121 Type 2 diabetes mellitus with diabetic nephropathy: Secondary | ICD-10-CM | POA: Diagnosis not present

## 2017-09-30 DIAGNOSIS — E039 Hypothyroidism, unspecified: Secondary | ICD-10-CM | POA: Diagnosis not present

## 2017-09-30 DIAGNOSIS — M1A0710 Idiopathic chronic gout, right ankle and foot, without tophus (tophi): Secondary | ICD-10-CM

## 2017-09-30 LAB — HEMOGLOBIN A1C: HEMOGLOBIN A1C: 6.3 % (ref 4.6–6.5)

## 2017-09-30 LAB — CBC WITH DIFFERENTIAL/PLATELET
Basophils Absolute: 0 10*3/uL (ref 0.0–0.1)
Basophils Relative: 0.3 % (ref 0.0–3.0)
EOS PCT: 1.1 % (ref 0.0–5.0)
Eosinophils Absolute: 0.1 10*3/uL (ref 0.0–0.7)
HCT: 34.8 % — ABNORMAL LOW (ref 36.0–46.0)
Hemoglobin: 12 g/dL (ref 12.0–15.0)
LYMPHS ABS: 2.6 10*3/uL (ref 0.7–4.0)
Lymphocytes Relative: 37.5 % (ref 12.0–46.0)
MCHC: 34.5 g/dL (ref 30.0–36.0)
MCV: 97.4 fl (ref 78.0–100.0)
MONO ABS: 0.5 10*3/uL (ref 0.1–1.0)
Monocytes Relative: 8 % (ref 3.0–12.0)
NEUTROS PCT: 53.1 % (ref 43.0–77.0)
Neutro Abs: 3.6 10*3/uL (ref 1.4–7.7)
Platelets: 130 10*3/uL — ABNORMAL LOW (ref 150.0–400.0)
RBC: 3.57 Mil/uL — AB (ref 3.87–5.11)
RDW: 14.1 % (ref 11.5–15.5)
WBC: 6.8 10*3/uL (ref 4.0–10.5)

## 2017-09-30 LAB — TSH: TSH: 7.51 u[IU]/mL — ABNORMAL HIGH (ref 0.35–4.50)

## 2017-09-30 LAB — COMPREHENSIVE METABOLIC PANEL
ALT: 20 U/L (ref 0–35)
AST: 19 U/L (ref 0–37)
Albumin: 4.1 g/dL (ref 3.5–5.2)
Alkaline Phosphatase: 73 U/L (ref 39–117)
BUN: 44 mg/dL — ABNORMAL HIGH (ref 6–23)
CALCIUM: 10.2 mg/dL (ref 8.4–10.5)
CHLORIDE: 103 meq/L (ref 96–112)
CO2: 28 meq/L (ref 19–32)
Creatinine, Ser: 1.89 mg/dL — ABNORMAL HIGH (ref 0.40–1.20)
GFR: 28.17 mL/min — AB (ref >60.00)
Glucose, Bld: 121 mg/dL — ABNORMAL HIGH (ref 70–99)
POTASSIUM: 5 meq/L (ref 3.5–5.1)
Sodium: 139 mEq/L (ref 135–145)
Total Bilirubin: 0.3 mg/dL (ref 0.2–1.2)
Total Protein: 7.2 g/dL (ref 6.0–8.3)

## 2017-09-30 LAB — LIPID PANEL
CHOL/HDL RATIO: 4
Cholesterol: 161 mg/dL (ref 0–200)
HDL: 44.5 mg/dL (ref >39.00)
LDL CALC: 81 mg/dL (ref 0–99)
NonHDL: 116.27
TRIGLYCERIDES: 176 mg/dL — AB (ref 0.0–149.0)
VLDL: 35.2 mg/dL (ref 0.0–40.0)

## 2017-09-30 LAB — URIC ACID: URIC ACID, SERUM: 7.1 mg/dL — AB (ref 2.4–7.0)

## 2017-09-30 LAB — T4, FREE: FREE T4: 0.57 ng/dL — AB (ref 0.60–1.60)

## 2017-10-07 ENCOUNTER — Encounter: Payer: Medicare Other | Admitting: Family Medicine

## 2017-10-07 ENCOUNTER — Ambulatory Visit: Payer: Self-pay | Admitting: Family Medicine

## 2017-10-07 ENCOUNTER — Telehealth: Payer: Self-pay

## 2017-10-07 NOTE — Telephone Encounter (Signed)
Copied from [place] [id number]. Topic: Quick Communication - Appointment Cancellation >> Oct 07, 2017 10:39 AM Cecelia Byars, NT wrote: Patient called to cancel appointment scheduled for  today at 11:15 Patient  HAS [id number] their appointment. Her ride called her at 10;38 to tell her she is unable to bring her today and it has to be another days ,she does not drive  Route to department's PEC pool.

## 2017-10-07 NOTE — Telephone Encounter (Signed)
Do you want to chg late fee cancellation. Pt has rescheduled CPX 10/21/17 at 12:30 with DR G.

## 2017-10-07 NOTE — Telephone Encounter (Signed)
Don't charge. Thanks.

## 2017-10-12 ENCOUNTER — Other Ambulatory Visit: Payer: Self-pay

## 2017-10-12 ENCOUNTER — Ambulatory Visit (INDEPENDENT_AMBULATORY_CARE_PROVIDER_SITE_OTHER): Payer: Medicare Other | Admitting: Family Medicine

## 2017-10-12 ENCOUNTER — Encounter: Payer: Self-pay | Admitting: Family Medicine

## 2017-10-12 VITALS — BP 110/64 | HR 64 | Temp 98.8°F | Ht 61.0 in | Wt 198.5 lb

## 2017-10-12 DIAGNOSIS — M1712 Unilateral primary osteoarthritis, left knee: Secondary | ICD-10-CM

## 2017-10-12 DIAGNOSIS — N183 Chronic kidney disease, stage 3 unspecified: Secondary | ICD-10-CM

## 2017-10-12 DIAGNOSIS — F310 Bipolar disorder, current episode hypomanic: Secondary | ICD-10-CM | POA: Diagnosis not present

## 2017-10-12 MED ORDER — METHYLPREDNISOLONE ACETATE 40 MG/ML IJ SUSP
80.0000 mg | Freq: Once | INTRAMUSCULAR | Status: AC
  Administered 2017-10-12: 80 mg via INTRA_ARTICULAR

## 2017-10-12 NOTE — Progress Notes (Signed)
Dr. Frederico Hamman T. Ryson Bacha, MD, [facility] [place], [address] Phone: [phone] Fax: [phone]  10/12/2017  Patient: Gina [name], MRN: [id number], DOB: 08-17-1950, 67 y.o.  Primary Physician:  Ria Bush, MD   Chief Complaint  Patient presents with  . Knee Pain    Left-Injection   Subjective:   Gina [name] is a 67 y.o. very pleasant female patient who presents with the following:  Known OA of the L knee.  She is a well-known patient who has known extensive left-sided tricompartmental osteoarthritis.  I saw her last 3 months ago, and at that point he did an intra-articular knee injection.  She is having pain daily now.  She also has chronic kidney disease stage III as well as bipolar disorder type I.  Knee inj, L  Past Medical History, Surgical History, Social History, Family History, Problem List, Medications, and Allergies have been reviewed and updated if relevant.  Patient Active Problem List   Diagnosis Date Noted  . Personal history of colon cancer   . Benign neoplasm of ascending colon   . Bipolar affective disorder, current episode hypomanic ([place]) 04/08/2017  . Low back pain radiating to left lower extremity 02/26/2017  . Acquired hypothyroidism 09/10/2016  . Thrombocytopenia ([place]) 06/15/2016  . Medicare annual wellness visit, subsequent 06/04/2016  . Advanced care planning/counseling discussion 06/04/2016  . External hemorrhoid 04/02/2016  . Chronic constipation 01/04/2016  . Bipolar I disorder, most recent episode (or current) manic ([place]) 12/03/2015  . Acute renal failure superimposed on stage 3 chronic kidney disease ([place]) 03/30/2015  . NASH (nonalcoholic steatohepatitis) 03/28/2015  . Gallbladder calculus without cholecystitis 03/28/2015  . Gout 03/27/2015  . HCV antibody positive 03/27/2015  . History of bladder cancer 03/27/2015  . History of bundle branch block  03/27/2015  . Slow transit constipation 03/27/2015  . Genetic testing 02/28/2015  . Loss of memory 09/04/2014  . Controlled type 2 diabetes mellitus with diabetic nephropathy ([place]) 06/07/2014  . Hyperlipidemia   . Anemia in CKD (chronic kidney disease) 01/26/2013  . Personal history of colonic adenomas and colon cancer 11/11/2012  . Left knee pain 10/02/2012  . OSA on CPAP   . CKD (chronic kidney disease) stage 3, GFR 30-59 ml/min (HCC)   . Obesity, Class II, BMI 35-39.9, with comorbidity ([place])   . HTN (hypertension), benign 04/07/2012  . Hx of colon cancer, stage I 08/05/2011    Past Medical History:  Diagnosis Date  . Anemia in chronic kidney disease   . Bipolar depression [facility])    sees psychiatrist - psych admission 03/2015  . Bladder cancer ([place]) 1990's  . Blood transfusion without reported diagnosis 2009  . Cataract    left  . CKD (chronic kidney disease) stage 3, GFR 30-59 ml/min (HCC)   . Colon cancer ([place]) 1990's  . DJD (degenerative joint disease), lumbar    chronic lower back pain  . Enterocutaneous fistula 04/07/2012   completed PT 06/2012 ([facility])  . Family history of breast cancer   . Family history of colon cancer   . Family history of stomach cancer   . GERD (gastroesophageal reflux disease)   . History of bladder cancer 1997  . History of colon cancer    s/p surgery  . History of uterine cancer    s/p hysterectomy  . Hyperlipidemia   . Hypertension   . IDA (iron deficiency anemia) 01/2013   thought due to h/o polyps  .  Obesity (BMI 30-39.9)   . OSA on CPAP    6cm H2O  . Personal history of colonic adenomas and colon cancer 11/11/2012  . Positive hepatitis C antibody test 09/2014   but negative confirmatory testing  . RBBB   . Uncontrolled type 2 diabetes mellitus with nephropathy ([place]) 06/07/2014   completed DSME 02/2016    Past Surgical History:  Procedure Laterality Date  . BREAST BIOPSY Right 01/2014   fibroadenoma  . COLONOSCOPY  07/2009  .  COLONOSCOPY  11/2012   2 tubular adenomas, mild diverticulosis, pending genetic testing for Lynch syndrome [name]) rpt 2 yrs  . COLONOSCOPY  02/2015   TA, diverticulosis, rpt 2 yrs [name])  . COLONOSCOPY WITH PROPOFOL N/A 06/09/2017   TAx1, rpt 2 yrs [name], [name], MD)  . DEXA  12/2009   WNL  . DOBUTAMINE STRESS ECHO  12/2009   no evidence of ischemia  . HERNIA REPAIR  02/04/12  . i & d abdominal wound  02/19/12  . LEFT OOPHORECTOMY  2005  . PARTIAL COLECTOMY  about 2008   for colon cancer  . PARTIAL HYSTERECTOMY  1981   uterine cancer, R ovary remains  . Reexploration of abdominal wound and Allograft placemet  02/26/12  . Removal of infected mesh and abdominal wound vac placement  02/24/12  . sleep study  02/2014   OSA - AHI 55, nadir 81% [name])    Social History   Socioeconomic History  . Marital status: Single    Spouse name: Not on file  . Number of children: 0  . Years of education: Not on file  . Highest education level: Not on file  Social Needs  . Financial resource strain: Not on file  . Food insecurity - worry: Not on file  . Food insecurity - inability: Not on file  . Transportation needs - medical: Not on file  . Transportation needs - non-medical: Not on file  Occupational History  . Not on file  Tobacco Use  . Smoking status: Never Smoker  . Smokeless tobacco: Never Used  Substance and Sexual Activity  . Alcohol use: No    Alcohol/week: 0.0 oz  . Drug use: No  . Sexual activity: No  Other Topics Concern  . Not on file  Social History Narrative   Lives with sister, no pets   Occupation: disabled, for bipolar and arthritis   Edu: GED   Activity: take walks   Diet: good water, vegetables daily   Religion: [place], Dr. Jimmye Norman (ph [phone])    Family History  Problem Relation Age of Onset  . Colon cancer Mother 53  . Stomach cancer Mother        dx in her 39s?  . CAD Father        MI; deceased 5  .  Mental illness Sister        anxiety/depression  . Thyroid disease Sister   . Breast cancer Maternal Grandmother        age at dx unknown  . Diabetes Brother   . Diabetes Brother   . Diabetes Other        aunts and uncles both sides  . Arthritis Other        strong fmhx  . Colon cancer Other 59       maternal half-sister; deceased    Allergies  Allergen Reactions  . Risperidone And Related Other (See Comments)  Reaction:  Altered mental status   . Penicillins Other (See Comments)    Pt states that this med knocks her out.  Has patient had a PCN reaction causing immediate rash, facial/tongue/throat swelling, SOB or lightheadedness with hypotension Unsure  Has patient had a PCN reaction causing severe rash involving mucus membranes or skin necrosis Unsure  Has patient had a PCN reaction that required hospitalization Unsure  Has patient had a PCN reaction occurring within the last 10 years Unsure  If all of the above answers are "NO", then may proceed with Cephalosporin use.  Clementeen Hoof [Iodinated Diagnostic Agents] Rash  . Tetanus Toxoids Rash       . Zetia [Ezetimibe] Rash    Medication list reviewed and updated in full in [place].  GEN: No fevers, chills. Nontoxic. Primarily MSK c/o today. MSK: Detailed in the HPI GI: tolerating PO intake without difficulty Neuro: No numbness, parasthesias, or tingling associated. Otherwise the pertinent positives of the ROS are noted above.   Objective:   BP 110/64   Pulse 64   Temp 98.8 F (37.1 C) (Oral)   Ht 5\' 1"  (1.549 m)   Wt 198 lb 8 oz (90 kg)   BMI 37.51 kg/m    GEN: WDWN, NAD, Non-toxic, Alert & Oriented x 3 HEENT: Atraumatic, Normocephalic.  Ears and Nose: No external deformity. EXTR: No clubbing/cyanosis/edema NEURO: Normal gait.  PSYCH: Normally interactive. Conversant. Not depressed or anxious appearing.  Calm demeanor.    The patient lacks 3 degrees of extension.  Flexion to 100 degrees.  Pain on  the medial joint line.  Patellar crepitus is evident.  Stable to varus and valgus stress.  ACL and PCL are intact.  She does have pain with flexion pinch testing.  Radiology: No results found.  Assessment and Plan:   Primary osteoarthritis of left knee - Plan: methylPREDNISolone acetate (DEPO-MEDROL) injection 80 mg  Bipolar affective disorder, current episode hypomanic (HCC)  CKD (chronic kidney disease) stage 3, GFR 30-59 ml/min (HCC)  Challenging case.  I am not comfortable doing more than 1 joint injection in this patient.  I reviewed the reasons why, and she seems to understand.  For now we will do a left-sided knee injection, and if her symptoms persist on the right, we can do another one at a different time.  Knee Injection, L Patient verbally consented to procedure. Risks (including potential rare risk of infection), benefits, and alternatives explained. Sterilely prepped with Chloraprep. Ethyl cholride used for anesthesia. 8 cc Lidocaine 1% mixed with 2 mL Depo-Medrol 40 mg injected using the anteromedial approach without difficulty. No complications with procedure and tolerated well. Patient had decreased pain post-injection.   Follow-up: No Follow-up on file.  Meds ordered this encounter  Medications  . methylPREDNISolone acetate (DEPO-MEDROL) injection 80 mg   Signed,  Tinslee Klare T. Gelena Klosinski, MD   Allergies as of 10/12/2017      Reactions   Risperidone And Related Other (See Comments)   Reaction:  Altered mental status    Penicillins Other (See Comments)   Pt states that this med knocks her out.  Has patient had a PCN reaction causing immediate rash, facial/tongue/throat swelling, SOB or lightheadedness with hypotension Unsure  Has patient had a PCN reaction causing severe rash involving mucus membranes or skin necrosis Unsure  Has patient had a PCN reaction that required hospitalization Unsure  Has patient had a PCN reaction occurring within the last 10 years Unsure    If all  of the above answers are "NO", then may proceed with Cephalosporin use.   Ivp Dye [iodinated Diagnostic Agents] Rash   Tetanus Toxoids Rash      Zetia [ezetimibe] Rash      Medication List        Accurate as of 10/12/17  1:42 PM. Always use your most recent med list.          ACCU-CHEK AVIVA PLUS test strip Generic drug:  glucose blood 1 each by Other route daily. E11.21   divalproex 500 MG DR tablet Commonly known as:  DEPAKOTE Take 1 tablet (500 mg total) by mouth 2 (two) times daily with a meal.   INVEGA SUSTENNA 234 MG/1.5ML Susp injection Generic drug:  paliperidone Inject 234 mg into the muscle every 30 (thirty) days.   lamoTRIgine 25 MG tablet Commonly known as:  LAMICTAL Take 25 mg by mouth daily.   levothyroxine 25 MCG tablet Commonly known as:  SYNTHROID, LEVOTHROID TAKE 1 TABLET IN THE MORNING BEFORE BREAKFAST   losartan 50 MG tablet Commonly known as:  COZAAR Take 1 tablet (50 mg total) by mouth daily.   MELATONIN PO Take 1 tablet by mouth at bedtime.   metFORMIN 500 MG tablet Commonly known as:  GLUCOPHAGE Take 1 tablet (500 mg total) by mouth daily with breakfast.   metoprolol tartrate 25 MG tablet Commonly known as:  LOPRESSOR TAKE 1 & 1/2 TABLET BY MOUTH TWICE DAILY WITH FOOD   nystatin powder Commonly known as:  MYCOSTATIN/NYSTOP Apply topically 3 (three) times daily.   polyethylene glycol powder powder Commonly known as:  GLYCOLAX/MIRALAX TAKE 17 GRAMS BY MOUTH TWICE DAILY AS NEEDED FOR MODERATE CONSTIPATION.   QUEtiapine 200 MG tablet Commonly known as:  SEROQUEL Take 200 mg by mouth at bedtime.   simvastatin 20 MG tablet Commonly known as:  ZOCOR Take 1 tablet (20 mg total) by mouth at bedtime.

## 2017-10-21 ENCOUNTER — Ambulatory Visit (INDEPENDENT_AMBULATORY_CARE_PROVIDER_SITE_OTHER): Payer: Medicare Other | Admitting: Family Medicine

## 2017-10-21 ENCOUNTER — Encounter: Payer: Self-pay | Admitting: Family Medicine

## 2017-10-21 VITALS — BP 120/68 | HR 59 | Temp 97.9°F | Wt 197.0 lb

## 2017-10-21 DIAGNOSIS — I10 Essential (primary) hypertension: Secondary | ICD-10-CM

## 2017-10-21 DIAGNOSIS — N183 Chronic kidney disease, stage 3 unspecified: Secondary | ICD-10-CM

## 2017-10-21 DIAGNOSIS — K824 Cholesterolosis of gallbladder: Secondary | ICD-10-CM | POA: Diagnosis not present

## 2017-10-21 DIAGNOSIS — F3110 Bipolar disorder, current episode manic without psychotic features, unspecified: Secondary | ICD-10-CM

## 2017-10-21 DIAGNOSIS — G4733 Obstructive sleep apnea (adult) (pediatric): Secondary | ICD-10-CM

## 2017-10-21 DIAGNOSIS — M25512 Pain in left shoulder: Secondary | ICD-10-CM | POA: Insufficient documentation

## 2017-10-21 DIAGNOSIS — K760 Fatty (change of) liver, not elsewhere classified: Secondary | ICD-10-CM | POA: Diagnosis not present

## 2017-10-21 DIAGNOSIS — E1121 Type 2 diabetes mellitus with diabetic nephropathy: Secondary | ICD-10-CM

## 2017-10-21 DIAGNOSIS — Z7189 Other specified counseling: Secondary | ICD-10-CM | POA: Diagnosis not present

## 2017-10-21 DIAGNOSIS — D696 Thrombocytopenia, unspecified: Secondary | ICD-10-CM | POA: Diagnosis not present

## 2017-10-21 DIAGNOSIS — E039 Hypothyroidism, unspecified: Secondary | ICD-10-CM

## 2017-10-21 DIAGNOSIS — E785 Hyperlipidemia, unspecified: Secondary | ICD-10-CM | POA: Diagnosis not present

## 2017-10-21 DIAGNOSIS — Z9989 Dependence on other enabling machines and devices: Secondary | ICD-10-CM

## 2017-10-21 DIAGNOSIS — M170 Bilateral primary osteoarthritis of knee: Secondary | ICD-10-CM

## 2017-10-21 DIAGNOSIS — D631 Anemia in chronic kidney disease: Secondary | ICD-10-CM

## 2017-10-21 MED ORDER — LEVOTHYROXINE SODIUM 50 MCG PO TABS: 50.0000 ug | ORAL_TABLET | Freq: Every day | ORAL | 11 refills | Status: AC

## 2017-10-21 NOTE — Assessment & Plan Note (Signed)
New machine, new mask with better fit. pt satisfied with this. Appreciate pulm care [name]).

## 2017-10-21 NOTE — Assessment & Plan Note (Signed)
Continues lamictal, depakote, and invega depo monthly. Appreciate psych care.

## 2017-10-21 NOTE — Assessment & Plan Note (Signed)
Advanced directives: has at home. Would want HC proxy to be nephew Gina [name].Asked to bring me copy

## 2017-10-21 NOTE — Assessment & Plan Note (Signed)
Reviewed with patient. Encouraged yearly [place] to monitor this.

## 2017-10-21 NOTE — Assessment & Plan Note (Addendum)
Chronic, stable. Continue metoprolol 37.5mg  bid and losartan 50mg  daily.

## 2017-10-21 NOTE — Progress Notes (Signed)
BP 120/68 (BP Location: Left Arm, Patient Position: Sitting, Cuff Size: Normal)   Pulse (!) 59   Temp 97.9 F (36.6 C) (Oral)   Wt 197 lb (89.4 kg)   SpO2 95%   BMI 37.22 kg/m    CC: 3 mo /fu visit Subjective:    Patient ID: Gina [name], female    DOB: [birth year], 67 y.o.   MRN: [id number]  HPI: Gina [name] is a 67 y.o. female presenting on 10/21/2017 for 3 mo follow-up   Saw Gina [name] 06/2017 for medicare wellness visit. Note reviewed. Failed hearing screen on R.   Had a fall several weeks ago and hit head and L shoulder - slow recovery. Missed step going down stairs while carrying grocery bag. Fell onto cement.   Chronic thrombocytopenia, has established with oncology Dr Gina [name] - latest note reviewed 08/2017. Normal workup including flow cytometry and abdominal ultrasound. Likely depakote related.   25mm gallbladder polyp on abd [place] - requested yearly [place].   OSA on CPAP - sees Dr Gina [name] yearly, last 08/2017. New CPAP machine is working better. Better mask fit.   Saw Dr Gina [name] last week - s/p L knee steroid injection 07/2017 and again 10/2017.   DM - does regularly check sugars - log she brings was reviewed. Fasting sugars 100-120, checks once daily. Compliant with antihyperglycemic regimen which includes: metformin 500mg  daily. Denies low sugars or hypoglycemic symptoms. Denies paresthesias. Last diabetic eye exam - states she saw eye doctor a few weeks ago. Pneumovax: UTD. Prevnar: UTD. Glucometer brand: unsure. DSME: completed 2017 Lab Results  Component Value Date   HGBA1C 6.3 09/30/2017   Diabetic Foot Exam - Simple   Simple Foot Form Diabetic Foot exam was performed with the following findings:  Yes 10/21/2017 12:58 PM  Visual Inspection No deformities, no ulcerations, no other skin breakdown bilaterally:  Yes Sensation Testing Intact to touch and monofilament testing bilaterally:  Yes Pulse Check Posterior Tibialis and Dorsalis pulse intact bilaterally:   Yes Comments    Lab Results  Component Value Date   MICROALBUR <0.7 03/19/2016    Hypothyroid - levothyroxine started 05/2017. Compliant with 82mcg daily, taken alone 30 min before breakfast  Preventative: H/o colon cancer s/p colectomy 2008.  COLONOSCOPY 02/2015; TA, diverticulosis, rpt 2 yrs Gina [name]) COLONOSCOPY WITH PROPOFOL 06/09/2017 TAx1, rpt 2 yrs Gina [name], [name], MD) Well woman exam - she is s/p hysterectomy and LSO 1981, pt states there may have been some concern for uterine cancer - all normal paps in past. Last normal 2016. Will stop screening Mammogram with biopsy - fibroadenoma 01/2014. mammo WNL 01/2016 and 01/2017  DEXA - WNL 12/2009 Flu shot yearly Tetanus shot allergy in chart, pt states may have caused small rash - consider retrial in future. prevnar 03/2016, pneumovax 07/2017 shingrix - discussed - declines Advanced directives: has at home. Would want HC proxy to be nephew Gina [name].Asked to bring me copy Seat belt use discussed Sunscreen use discussed. No changing moles on skin Non smoker - significant second hand smoking  Alcohol - none   Lives with sister, 1 chihuahua  Occupation: disabled, for bipolar and arthritis  Edu: GED  Activity: takes walks  Diet: good water, vegetables daily, some fruits   Relevant past medical, surgical, family and social history reviewed and updated as indicated. Interim medical history since our last visit reviewed. Allergies and medications reviewed and updated. Outpatient Medications Prior to Visit  Medication Sig Dispense Refill  . ACCU-CHEK AVIVA PLUS test strip  1 each by Other route daily. E11.21 100 each 3  . divalproex (DEPAKOTE) 500 MG DR tablet Take 1 tablet (500 mg total) by mouth 2 (two) times daily with a meal. 60 tablet 3  . lamoTRIgine (LAMICTAL) 25 MG tablet Take 25 mg by mouth daily.    Marland Kitchen losartan (COZAAR) 50 MG tablet Take 1 tablet (50 mg total) by mouth daily. 30 tablet 11  . MELATONIN PO Take 1 tablet by mouth at  bedtime.    . metFORMIN (GLUCOPHAGE) 500 MG tablet Take 1 tablet (500 mg total) by mouth daily with breakfast. 30 tablet 6  . metoprolol tartrate (LOPRESSOR) 25 MG tablet TAKE 1 & 1/2 TABLET BY MOUTH TWICE DAILY WITH FOOD 84 tablet 5  . nystatin (MYCOSTATIN/NYSTOP) powder Apply topically 3 (three) times daily. 60 g 1  . paliperidone (INVEGA SUSTENNA) 234 MG/1.5ML SUSP injection Inject 234 mg into the muscle every 30 (thirty) days.    . polyethylene glycol powder (GLYCOLAX/MIRALAX) powder TAKE 17 GRAMS BY MOUTH TWICE DAILY AS NEEDED FOR MODERATE CONSTIPATION. 527 g 3  . QUEtiapine (SEROQUEL) 200 MG tablet Take 200 mg by mouth at bedtime.    . simvastatin (ZOCOR) 20 MG tablet Take 1 tablet (20 mg total) by mouth at bedtime. 30 tablet 11  . levothyroxine (SYNTHROID, LEVOTHROID) 25 MCG tablet TAKE 1 TABLET IN THE MORNING BEFORE BREAKFAST 30 tablet 11   No facility-administered medications prior to visit.      Per HPI unless specifically indicated in ROS section below Review of Systems     Objective:    BP 120/68 (BP Location: Left Arm, Patient Position: Sitting, Cuff Size: Normal)   Pulse (!) 59   Temp 97.9 F (36.6 C) (Oral)   Wt 197 lb (89.4 kg)   SpO2 95%   BMI 37.22 kg/m   Wt Readings from Last 3 Encounters:  10/21/17 197 lb (89.4 kg)  10/12/17 198 lb 8 oz (90 kg)  08/10/17 196 lb 11.2 oz (89.2 kg)    Physical Exam  Constitutional: She is oriented to person, place, and time. She appears well-developed and well-nourished. No distress.  HENT:  Head: Normocephalic and atraumatic.  Right Ear: Hearing, tympanic membrane, external ear and ear canal normal.  Left Ear: Hearing, tympanic membrane, external ear and ear canal normal.  Nose: Nose normal.  Mouth/Throat: Uvula is midline, oropharynx is clear and moist and mucous membranes are normal. No oropharyngeal exudate, posterior oropharyngeal edema or posterior oropharyngeal erythema.  Eyes: Conjunctivae and EOM are normal. Pupils  are equal, round, and reactive to light. No scleral icterus.  Neck: Normal range of motion. Neck supple. Carotid bruit is not present. No thyromegaly present.  Cardiovascular: Normal rate, regular rhythm, normal heart sounds and intact distal pulses.  No murmur heard. Pulses:      Radial pulses are 2+ on the right side, and 2+ on the left side.  Pulmonary/Chest: Effort normal and breath sounds normal. No respiratory distress. She has no wheezes. She has no rales.  Abdominal: Soft. Bowel sounds are normal. She exhibits no distension and no mass. There is no tenderness. There is no rebound and no guarding. A hernia (incisional) is present. Hernia confirmed positive in the ventral area.  Musculoskeletal: Normal range of motion. She exhibits no edema.  Mild discomfort to palpation L lateral upper arm and anterior shoulder. FROM at shoulder  Lymphadenopathy:    She has no cervical adenopathy.  Neurological: She is alert and oriented to person, place, and time.  CN grossly intact, station and gait intact  Skin: Skin is warm and dry. No rash noted.  Psychiatric: She has a normal mood and affect. Her behavior is normal. Judgment and thought content normal.  Nursing note and vitals reviewed.  Results for orders placed or performed in visit on 09/30/17  Uric acid  Result Value Ref Range   Uric Acid, Serum 7.1 (H) 2.4 - 7.0 mg/dL  T4, free  Result Value Ref Range   Free T4 0.57 (L) 0.60 - 1.60 ng/dL  CBC with Differential/Platelet  Result Value Ref Range   WBC 6.8 4.0 - 10.5 K/uL   RBC 3.57 (L) 3.87 - 5.11 Mil/uL   Hemoglobin 12.0 12.0 - 15.0 g/dL   HCT 34.8 (L) 36.0 - 46.0 %   MCV 97.4 78.0 - 100.0 fl   MCHC 34.5 30.0 - 36.0 g/dL   RDW 14.1 11.5 - 15.5 %   Platelets 130.0 (L) 150.0 - 400.0 K/uL   Neutrophils Relative % 53.1 43.0 - 77.0 %   Lymphocytes Relative 37.5 12.0 - 46.0 %   Monocytes Relative 8.0 3.0 - 12.0 %   Eosinophils Relative 1.1 0.0 - 5.0 %   Basophils Relative 0.3 0.0 -  3.0 %   Neutro Abs 3.6 1.4 - 7.7 K/uL   Lymphs Abs 2.6 0.7 - 4.0 K/uL   Monocytes Absolute 0.5 0.1 - 1.0 K/uL   Eosinophils Absolute 0.1 0.0 - 0.7 K/uL   Basophils Absolute 0.0 0.0 - 0.1 K/uL  Hemoglobin A1c  Result Value Ref Range   Hgb A1c MFr Bld 6.3 4.6 - 6.5 %  TSH  Result Value Ref Range   TSH 7.51 (H) 0.35 - 4.50 uIU/mL  Comprehensive metabolic panel  Result Value Ref Range   Sodium 139 135 - 145 mEq/L   Potassium 5.0 3.5 - 5.1 mEq/L   Chloride 103 96 - 112 mEq/L   CO2 28 19 - 32 mEq/L   Glucose, Bld 121 (H) 70 - 99 mg/dL   BUN 44 (H) 6 - 23 mg/dL   Creatinine, Ser 1.89 (H) 0.40 - 1.20 mg/dL   Total Bilirubin 0.3 0.2 - 1.2 mg/dL   Alkaline Phosphatase 73 39 - 117 U/L   AST 19 0 - 37 U/L   ALT 20 0 - 35 U/L   Total Protein 7.2 6.0 - 8.3 g/dL   Albumin 4.1 3.5 - 5.2 g/dL   Calcium 10.2 8.4 - 10.5 mg/dL   GFR 28.17 (L) >60.00 mL/min  Lipid panel  Result Value Ref Range   Cholesterol 161 0 - 200 mg/dL   Triglycerides 176.0 (H) 0.0 - 149.0 mg/dL   HDL 44.50 >39.00 mg/dL   VLDL 35.2 0.0 - 40.0 mg/dL   LDL Cholesterol 81 0 - 99 mg/dL   Total CHOL/HDL Ratio 4    NonHDL 116.27       Assessment & Plan:   Problem List Items Addressed This Visit    Acquired hypothyroidism    Levels remain low - will increase levothyroxine to 53mcg daily.       Relevant Medications   levothyroxine (SYNTHROID, LEVOTHROID) 50 MCG tablet   Advanced care planning/counseling discussion    Advanced directives: has at home. Would want HC proxy to be nephew Gina [name].Asked to bring me copy      Anemia in CKD (chronic kidney disease)    Hgb has stabilized.       Bipolar I disorder, most recent episode (or current) manic ([place])  Continues lamictal, depakote, and invega depo monthly. Appreciate psych care.       CKD (chronic kidney disease) stage 3, GFR 30-59 ml/min (HCC)    Progression of kidney disease with GFR 28. Reviewed importance of good hydration status, pt already avoids NSAIDs.  Caution with invega depo IM. Will recheck at next visit 3 mo and if progressive deterioration will refer to nephrology - pt agrees with plan. Check UA, urine microalb next visit.       Relevant Orders   Renal function panel   Parathyroid hormone, intact (no Ca)   Microalbumin / creatinine urine ratio   Controlled type 2 diabetes mellitus with diabetic nephropathy (HCC)    Chronic, stable. Continue metformin 500mg  once daily. If progressive kidney disease, may need to discontinue metformin.       Gallbladder polyp - Primary    Reviewed with patient. Encouraged yearly [place] to monitor this.       HTN (hypertension), benign    Chronic, stable. Continue metoprolol 37.5mg  bid and losartan 50mg  daily.       Hyperlipidemia    Chronic, stable. Continue simvastatin. The 10-year ASCVD risk score Mikey Bussing DC Brooke Bonito., et al., 2013) is: 14.8%   Values used to calculate the score:     Age: 76 years     Sex: Female     Is Non-Hispanic African American: No     Diabetic: Yes     Tobacco smoker: No     Systolic Blood Pressure: 536 mmHg     Is BP treated: Yes     HDL Cholesterol: 44.5 mg/dL     Total Cholesterol: 161 mg/dL       Left shoulder pain    Persists after fall. rec avoid NSAIDs. Reviewed tylenol dosing. Suggested trial OTC topical therapy like aspercream or bengay.       NAFLD (nonalcoholic fatty liver disease)    Reviewed latest abd [place]. LFTs normal.      OSA on CPAP    New machine, new mask with better fit. pt satisfied with this. Appreciate pulm care Gina [name]).       Osteoarthritis of knee   Severe obesity (BMI 35.0-39.9) with comorbidity ([place])    Weight stable.       Thrombocytopenia ([place])    Appreciate onc care - sees Dr Gina [name]. Stable period.           Meds ordered this encounter  Medications  . levothyroxine (SYNTHROID, LEVOTHROID) 50 MCG tablet    Sig: Take 1 tablet (50 mcg total) by mouth daily before breakfast.    Dispense:  30 tablet    Refill:  11   Orders Placed  This Encounter  Procedures  . Renal function panel    Standing Status:   Future    Standing Expiration Date:   10/22/2018  . Parathyroid hormone, intact (no Ca)    Standing Status:   Future    Standing Expiration Date:   10/22/2018  . Microalbumin / creatinine urine ratio    Standing Status:   Future    Standing Expiration Date:   10/22/2018    Follow up plan: Return in about 3 months (around 01/21/2018) for follow up visit - kidney recheck.  Ria Bush, MD

## 2017-10-21 NOTE — Assessment & Plan Note (Signed)
Levels remain low - will increase levothyroxine to 41mcg daily.

## 2017-10-21 NOTE — Assessment & Plan Note (Signed)
Reviewed latest abd [place]. LFTs normal.

## 2017-10-21 NOTE — Assessment & Plan Note (Signed)
Chronic, stable. Continue metformin 500mg  once daily. If progressive kidney disease, may need to discontinue metformin.

## 2017-10-21 NOTE — Assessment & Plan Note (Signed)
Chronic, stable. Continue simvastatin. The 10-year ASCVD risk score Mikey Bussing DC Brooke Bonito., et al., 2013) is: 14.8%   Values used to calculate the score:     Age: 67 years     Sex: Female     Is Non-Hispanic African American: No     Diabetic: Yes     Tobacco smoker: No     Systolic Blood Pressure: 829 mmHg     Is BP treated: Yes     HDL Cholesterol: 44.5 mg/dL     Total Cholesterol: 161 mg/dL

## 2017-10-21 NOTE — Assessment & Plan Note (Signed)
Weight stable.

## 2017-10-21 NOTE — Assessment & Plan Note (Signed)
Appreciate onc care - sees Dr Tasia Catchings. Stable period.

## 2017-10-21 NOTE — Assessment & Plan Note (Signed)
Persists after fall. rec avoid NSAIDs. Reviewed tylenol dosing. Suggested trial OTC topical therapy like aspercream or bengay.

## 2017-10-21 NOTE — Patient Instructions (Addendum)
Bring me copy of your living will for you and Lelan Pons.  Thyroid remains low - increase levothyroxine to 60mcg daily - may take 2 of current dose until you run out, then start higher dose at pharmacy.  Kidneys were a bit worse - make sure you stay well hydrated. Return in 3 months for follow up visit.  Max tylenol dose is 2 extra strength tablets (500mg ) three times a day with meals. Avoid ibuprofen, advil, aleve. Try over the counter aspercream or bengay or salon pas patches for pain.  Good to see you today, call us with questions. Return as needed or in 3 months for follow up visit.

## 2017-10-21 NOTE — Assessment & Plan Note (Signed)
Hgb has stabilized.

## 2017-10-21 NOTE — Assessment & Plan Note (Addendum)
Progression of kidney disease with GFR 28. Reviewed importance of good hydration status, pt already avoids NSAIDs. Caution with invega depo IM. Will recheck at next visit 3 mo and if progressive deterioration will refer to nephrology - pt agrees with plan. Check UA, urine microalb next visit.

## 2017-10-29 DIAGNOSIS — Z79899 Other long term (current) drug therapy: Secondary | ICD-10-CM | POA: Diagnosis not present

## 2017-10-30 DIAGNOSIS — G4733 Obstructive sleep apnea (adult) (pediatric): Secondary | ICD-10-CM | POA: Diagnosis not present

## 2017-11-03 ENCOUNTER — Encounter: Payer: Self-pay | Admitting: Medical Oncology

## 2017-11-03 ENCOUNTER — Emergency Department
Admission: EM | Admit: 2017-11-03 | Discharge: 2017-11-04 | Disposition: A | Discharge status: Other Acute Inpatient Hospital | Payer: Medicare Other | Attending: Emergency Medicine | Admitting: Emergency Medicine

## 2017-11-03 DIAGNOSIS — M25561 Pain in right knee: Secondary | ICD-10-CM | POA: Insufficient documentation

## 2017-11-03 DIAGNOSIS — Z7984 Long term (current) use of oral hypoglycemic drugs: Secondary | ICD-10-CM | POA: Insufficient documentation

## 2017-11-03 DIAGNOSIS — F99 Mental disorder, not otherwise specified: Secondary | ICD-10-CM | POA: Diagnosis not present

## 2017-11-03 DIAGNOSIS — Z8551 Personal history of malignant neoplasm of bladder: Secondary | ICD-10-CM | POA: Insufficient documentation

## 2017-11-03 DIAGNOSIS — E875 Hyperkalemia: Secondary | ICD-10-CM

## 2017-11-03 DIAGNOSIS — E1121 Type 2 diabetes mellitus with diabetic nephropathy: Secondary | ICD-10-CM | POA: Diagnosis present

## 2017-11-03 DIAGNOSIS — F419 Anxiety disorder, unspecified: Secondary | ICD-10-CM | POA: Insufficient documentation

## 2017-11-03 DIAGNOSIS — F319 Bipolar disorder, unspecified: Secondary | ICD-10-CM | POA: Insufficient documentation

## 2017-11-03 DIAGNOSIS — I129 Hypertensive chronic kidney disease with stage 1 through stage 4 chronic kidney disease, or unspecified chronic kidney disease: Secondary | ICD-10-CM | POA: Insufficient documentation

## 2017-11-03 DIAGNOSIS — F3110 Bipolar disorder, current episode manic without psychotic features, unspecified: Secondary | ICD-10-CM | POA: Diagnosis present

## 2017-11-03 DIAGNOSIS — F0633 Mood disorder due to known physiological condition with manic features: Secondary | ICD-10-CM | POA: Diagnosis not present

## 2017-11-03 DIAGNOSIS — I10 Essential (primary) hypertension: Secondary | ICD-10-CM | POA: Diagnosis present

## 2017-11-03 DIAGNOSIS — Z5181 Encounter for therapeutic drug level monitoring: Secondary | ICD-10-CM | POA: Diagnosis not present

## 2017-11-03 DIAGNOSIS — N183 Chronic kidney disease, stage 3 (moderate): Secondary | ICD-10-CM | POA: Insufficient documentation

## 2017-11-03 DIAGNOSIS — G4700 Insomnia, unspecified: Secondary | ICD-10-CM | POA: Insufficient documentation

## 2017-11-03 DIAGNOSIS — Z85038 Personal history of other malignant neoplasm of large intestine: Secondary | ICD-10-CM | POA: Insufficient documentation

## 2017-11-03 DIAGNOSIS — Z79899 Other long term (current) drug therapy: Secondary | ICD-10-CM | POA: Insufficient documentation

## 2017-11-03 DIAGNOSIS — E1122 Type 2 diabetes mellitus with diabetic chronic kidney disease: Secondary | ICD-10-CM | POA: Insufficient documentation

## 2017-11-03 LAB — COMPREHENSIVE METABOLIC PANEL
ALT: 25 U/L (ref 14–54)
AST: 28 U/L (ref 15–41)
Albumin: 4.4 g/dL (ref 3.5–5.0)
Alkaline Phosphatase: 70 U/L (ref 38–126)
Anion gap: 14 (ref 5–15)
BUN: 44 mg/dL — ABNORMAL HIGH (ref 6–20)
CO2: 21 mmol/L — ABNORMAL LOW (ref 22–32)
Calcium: 10.4 mg/dL — ABNORMAL HIGH (ref 8.9–10.3)
Chloride: 104 mmol/L (ref 101–111)
Creatinine, Ser: 2 mg/dL — ABNORMAL HIGH (ref 0.44–1.00)
GFR calc Af Amer: 29 mL/min — ABNORMAL LOW (ref >60)
GFR calc non Af Amer: 25 mL/min — ABNORMAL LOW (ref >60)
Glucose, Bld: 128 mg/dL — ABNORMAL HIGH (ref 65–99)
Potassium: 5.5 mmol/L — ABNORMAL HIGH (ref 3.5–5.1)
Sodium: 139 mmol/L (ref 135–145)
Total Bilirubin: 0.6 mg/dL (ref 0.3–1.2)
Total Protein: 7.7 g/dL (ref 6.5–8.1)

## 2017-11-03 LAB — CBC
HCT: 36 % (ref 35.0–47.0)
Hemoglobin: 12.3 g/dL (ref 12.0–16.0)
MCH: 33.3 pg (ref 26.0–34.0)
MCHC: 34.3 g/dL (ref 32.0–36.0)
MCV: 97.1 fL (ref 80.0–100.0)
Platelets: 148 10*3/uL — ABNORMAL LOW (ref 150–440)
RBC: 3.7 MIL/uL — ABNORMAL LOW (ref 3.80–5.20)
RDW: 14 % (ref 11.5–14.5)
WBC: 8.7 10*3/uL (ref 3.6–11.0)

## 2017-11-03 LAB — BASIC METABOLIC PANEL
ANION GAP: 10 (ref 5–15)
BUN: 40 mg/dL — ABNORMAL HIGH (ref 6–20)
CO2: 25 mmol/L (ref 22–32)
Calcium: 9.8 mg/dL (ref 8.9–10.3)
Chloride: 106 mmol/L (ref 101–111)
Creatinine, Ser: 1.84 mg/dL — ABNORMAL HIGH (ref 0.44–1.00)
GFR calc Af Amer: 32 mL/min — ABNORMAL LOW (ref >60)
GFR calc non Af Amer: 27 mL/min — ABNORMAL LOW (ref >60)
Glucose, Bld: 206 mg/dL — ABNORMAL HIGH (ref 65–99)
POTASSIUM: 4.8 mmol/L (ref 3.5–5.1)
Sodium: 141 mmol/L (ref 135–145)

## 2017-11-03 LAB — URINALYSIS, ROUTINE W REFLEX MICROSCOPIC
BACTERIA UA: NONE SEEN
Bilirubin Urine: NEGATIVE
Glucose, UA: NEGATIVE mg/dL
KETONES UR: NEGATIVE mg/dL
LEUKOCYTES UA: NEGATIVE
Nitrite: NEGATIVE
PROTEIN: NEGATIVE mg/dL
Specific Gravity, Urine: 1.011 (ref 1.005–1.030)
pH: 6 (ref 5.0–8.0)

## 2017-11-03 LAB — SALICYLATE LEVEL: Salicylate Lvl: <7 mg/dL (ref 2.8–30.0)

## 2017-11-03 LAB — ACETAMINOPHEN LEVEL: Acetaminophen (Tylenol), Serum: <10 ug/mL — ABNORMAL LOW (ref 10–30)

## 2017-11-03 LAB — URINE DRUG SCREEN, QUALITATIVE (ARMC ONLY)
AMPHETAMINES, UR SCREEN: NOT DETECTED
Barbiturates, Ur Screen: NOT DETECTED
Benzodiazepine, Ur Scrn: NOT DETECTED
Cannabinoid 50 Ng, Ur ~~LOC~~: NOT DETECTED
Cocaine Metabolite,Ur ~~LOC~~: NOT DETECTED
MDMA (ECSTASY) UR SCREEN: NOT DETECTED
Methadone Scn, Ur: NOT DETECTED
Opiate, Ur Screen: NOT DETECTED
Phencyclidine (PCP) Ur S: NOT DETECTED
TRICYCLIC, UR SCREEN: NOT DETECTED

## 2017-11-03 LAB — ETHANOL: Alcohol, Ethyl (B): <10 mg/dL (ref <10)

## 2017-11-03 MED ORDER — QUETIAPINE FUMARATE 200 MG PO TABS
200.0000 mg | ORAL_TABLET | Freq: Every day | ORAL | Status: DC
  Administered 2017-11-03: 200 mg via ORAL
  Filled 2017-11-03: qty 1

## 2017-11-03 MED ORDER — LOSARTAN POTASSIUM 50 MG PO TABS
50.0000 mg | ORAL_TABLET | Freq: Every day | ORAL | Status: DC
  Administered 2017-11-04: 50 mg via ORAL
  Filled 2017-11-03: qty 1

## 2017-11-03 MED ORDER — SODIUM CHLORIDE 0.9 % IV SOLN
1000.0000 mL | Freq: Once | INTRAVENOUS | Status: AC
  Administered 2017-11-03: 1000 mL via INTRAVENOUS

## 2017-11-03 MED ORDER — METOPROLOL TARTRATE 25 MG PO TABS
37.5000 mg | ORAL_TABLET | Freq: Two times a day (BID) | ORAL | Status: DC
  Administered 2017-11-03 – 2017-11-04 (×2): 37.5 mg via ORAL
  Filled 2017-11-03 (×2): qty 2

## 2017-11-03 MED ORDER — METFORMIN HCL 500 MG PO TABS
500.0000 mg | ORAL_TABLET | Freq: Every day | ORAL | Status: DC
  Administered 2017-11-04: 500 mg via ORAL
  Filled 2017-11-03: qty 1

## 2017-11-03 MED ORDER — DIVALPROEX SODIUM 500 MG PO DR TAB
500.0000 mg | DELAYED_RELEASE_TABLET | Freq: Two times a day (BID) | ORAL | Status: DC
  Administered 2017-11-03 – 2017-11-04 (×2): 500 mg via ORAL
  Filled 2017-11-03 (×2): qty 1

## 2017-11-03 MED ORDER — LEVOTHYROXINE SODIUM 25 MCG PO TABS
25.0000 ug | ORAL_TABLET | Freq: Every day | ORAL | Status: DC
  Administered 2017-11-04: 25 ug via ORAL
  Filled 2017-11-03: qty 1

## 2017-11-03 MED ORDER — SIMVASTATIN 40 MG PO TABS: 20.0000 mg | ORAL_TABLET | Freq: Every day | ORAL | Status: DC

## 2017-11-03 NOTE — ED Notes (Signed)
Report given to [employer] in [place]. Patient to be moved to [facility] room 3. Patient dressed out by this Probation officer. Items placed in bag and labeled.

## 2017-11-03 NOTE — ED Notes (Signed)
BEHAVIORAL HEALTH ROUNDING Patient sleeping: No. Patient alert and oriented: yes Behavior appropriate: Yes.  ; If no, describe:  Nutrition and fluids offered: yes Toileting and hygiene offered: Yes  Sitter present: q15 minute observations and security monitoring Law enforcement present: Yes    

## 2017-11-03 NOTE — ED Notes (Signed)
Pm meds not administered as ordered due to meds have not arrived from pharmacy - they will be administered once they arrive  Pt aware  Continue to monitor

## 2017-11-03 NOTE — BH Assessment (Addendum)
Per Dr. Prescott Gum patient meets criteria for inpatient psychiatric treatment and admit to BMU once bed becomes available.

## 2017-11-03 NOTE — ED Notes (Signed)
Pt. Transferred to [place] from ED to room 5 after screening for contraband. Report to include Situation, Background, Assessment and Recommendations from [place]. Pt. Oriented to unit including Q15 minute rounds as well as the security cameras for their protection. Patient is alert and oriented, warm and dry in no acute distress. Patient denies SI, HI, and AVH. Pt. Encouraged to let me know if needs arise.

## 2017-11-03 NOTE — ED Notes (Signed)
Hourly rounding reveals patient in room. No complaints, stable, in no acute distress. Q15 minute rounds and monitoring via Security Cameras to continue. 

## 2017-11-03 NOTE — ED Notes (Signed)
Hourly rounding reveals patient sleeping in room. No complaints, stable, in no acute distress. Q15 minute rounds and monitoring via Security Cameras to continue. 

## 2017-11-03 NOTE — ED Notes (Signed)
Patient unable to get sample

## 2017-11-03 NOTE — BH Assessment (Addendum)
Per Charge Nurse Silva Bandy, not accepting patients in BMU at this time due to staffing.

## 2017-11-03 NOTE — ED Notes (Signed)
"  I can't get my thought together - I am losing my mind - I can't eat and I can't sleep"  Denies SI/HI  Denies AH/VH

## 2017-11-03 NOTE — ED Notes (Signed)
Hourly rounding reveals patient in day room. No complaints, stable, in no acute distress. Q15 minute rounds and monitoring via Security Cameras to continue. 

## 2017-11-03 NOTE — Consult Note (Signed)
[facility] Consult   Reason for Consult: Consult for 67 year old woman with a history of bipolar disorder who comes to the hospital saying she thinks she has a nervous breakdown Referring Physician: Corky Downs Patient Identification: Gina [name] MRN:  [id number] Principal Diagnosis: Bipolar I disorder, most recent episode (or current) manic ([place]) Diagnosis:   Patient Active Problem List   Diagnosis Date Noted  . Gallbladder polyp [K82.4] 10/21/2017  . Left shoulder pain [M25.512] 10/21/2017  . Benign neoplasm of ascending colon [D12.2]   . Bipolar affective disorder, current episode hypomanic ([place]) [F31.0] 04/08/2017  . Low back pain radiating to left lower extremity [M54.5, M79.605] 02/26/2017  . Acquired hypothyroidism [E03.9] 09/10/2016  . Thrombocytopenia ([place]) [D69.6] 06/15/2016  . Medicare annual wellness visit, subsequent [Z00.00] 06/04/2016  . Advanced care planning/counseling discussion [Z71.89] 06/04/2016  . External hemorrhoid [K64.4] 04/02/2016  . Slow transit constipation [K59.01] 01/04/2016  . Bipolar I disorder, most recent episode (or current) manic ([place]) [F31.10] 12/03/2015  . NAFLD (nonalcoholic fatty liver disease) [K76.0] 03/28/2015  . Gallbladder calculus without cholecystitis [K80.20] 03/28/2015  . Gout [M10.9] 03/27/2015  . HCV antibody positive [R76.8] 03/27/2015  . History of bladder cancer [Z85.51] 03/27/2015  . History of bundle branch block [Z86.79] 03/27/2015  . Genetic testing [Z13.79] 02/28/2015  . Loss of memory [R41.3] 09/04/2014  . Controlled type 2 diabetes mellitus with diabetic nephropathy ([place]) [E11.21] 06/07/2014  . Hyperlipidemia [E78.5]   . Anemia in CKD (chronic kidney disease) [N18.9, D63.1] 01/26/2013  . Personal history of colonic adenomas and colon cancer [Z86.010] 11/11/2012  . Osteoarthritis of knee [M17.10] 10/02/2012  . OSA on CPAP [G47.33, Z99.89]   . CKD (chronic kidney disease) stage 3, GFR 30-59 ml/min  (HCC) [N18.3]   . Severe obesity (BMI 35.0-39.9) with comorbidity ([place]) [E66.01]   . HTN (hypertension), benign [I10] 04/07/2012  . Hx of colon cancer, stage I [Z85.038] 08/05/2011    Total Time spent with patient: 1 hour  Subjective:   Gina [name] is a 67 y.o. female patient admitted with "I just cannot focus".  HPI: Patient interviewed.  Chart reviewed.  67 year old woman with a long-standing history of bipolar disorder.  She says for the last several days she feels like her mind is racing and she cannot focus.  Cannot concentrate.  Not sleeping well for several days.  Feels irritable and feels like her behavior is driving her sister crazy.  Not eating well for a few days.  Having rapid mood swings with crying and irritability.  He has had some visual hallucinations but no auditory hallucinations.  No suicidal thoughts but feels helpless and out of control.  She says that she has been taking her medicine as prescribed.  Denies any new medical problems.  Social history: Long-standing bipolar disorder.  Lives with her sister.  Has outpatient care from an act team.  Medical history: Multiple medical problems including a history of bladder cancer history of hypertension diabetes  Substance abuse history: Denies any alcohol or drug abuse current or past  Past Psychiatric History: Long-standing bipolar disorder.  Multiple hospitalizations in the past.  Several prior visits to the emergency room as well.  One prior suicide attempt distant past no recent violence.  Usually pretty stable on her medicine.  Risk to Self: Is patient at risk for suicide?: No Risk to Others:   Prior Inpatient Therapy:   Prior Outpatient Therapy:    Past Medical History:  Past Medical History:  Diagnosis Date  . Anemia  in chronic kidney disease   . Bipolar depression [facility])    sees psychiatrist - psych admission 03/2015  . Bladder cancer ([place]) 1990's  . Blood transfusion without reported diagnosis 2009  .  Cataract    left  . CKD (chronic kidney disease) stage 3, GFR 30-59 ml/min (HCC)   . Colon cancer ([place]) 1990's  . DJD (degenerative joint disease), lumbar    chronic lower back pain  . Enterocutaneous fistula 04/07/2012   completed PT 06/2012 ([facility])  . Family history of breast cancer   . Family history of colon cancer   . Family history of stomach cancer   . GERD (gastroesophageal reflux disease)   . History of bladder cancer 1997  . History of colon cancer    s/p surgery  . History of uterine cancer    s/p hysterectomy  . Hyperlipidemia   . Hypertension   . IDA (iron deficiency anemia) 01/2013   thought due to h/o polyps  . Obesity (BMI 30-39.9)   . OSA on CPAP    6cm H2O  . Personal history of colonic adenomas and colon cancer 11/11/2012  . Positive hepatitis C antibody test 09/2014   but negative confirmatory testing  . RBBB   . Uncontrolled type 2 diabetes mellitus with nephropathy ([place]) 06/07/2014   completed DSME 02/2016    Past Surgical History:  Procedure Laterality Date  . BREAST BIOPSY Right 01/2014   fibroadenoma  . COLONOSCOPY  07/2009  . COLONOSCOPY  11/2012   2 tubular adenomas, mild diverticulosis, pending genetic testing for Lynch syndrome [name]) rpt 2 yrs  . COLONOSCOPY  02/2015   TA, diverticulosis, rpt 2 yrs [name])  . COLONOSCOPY WITH PROPOFOL N/A 06/09/2017   TAx1, rpt 2 yrs [name], [name], MD)  . DEXA  12/2009   WNL  . DOBUTAMINE STRESS ECHO  12/2009   no evidence of ischemia  . HERNIA REPAIR  02/04/12  . i & d abdominal wound  02/19/12  . LEFT OOPHORECTOMY  2005  . PARTIAL COLECTOMY  about 2008   for colon cancer  . PARTIAL HYSTERECTOMY  1981   uterine cancer, R ovary remains  . Reexploration of abdominal wound and Allograft placemet  02/26/12  . Removal of infected mesh and abdominal wound vac placement  02/24/12  . sleep study  02/2014   OSA - AHI 55, nadir 81% [name])   Family History:  Family History  Problem Relation Age of Onset  .  Colon cancer Mother 62  . Stomach cancer Mother        dx in her 58s?  . CAD Father        MI; deceased 44  . Mental illness Sister        anxiety/depression  . Thyroid disease Sister   . Breast cancer Maternal Grandmother        age at dx unknown  . Diabetes Brother   . Diabetes Brother   . Diabetes Other        aunts and uncles both sides  . Arthritis Other        strong fmhx  . Colon cancer Other 100       maternal half-sister; deceased   Family Psychiatric  History: No known family history Social History:  Social History   Substance and Sexual Activity  Alcohol Use No  . Alcohol/week: 0.0 oz     Social History   Substance and Sexual Activity  Drug Use No    Social History  Socioeconomic History  . Marital status: Single    Spouse name: Not on file  . Number of children: 0  . Years of education: Not on file  . Highest education level: Not on file  Occupational History  . Not on file  Social Needs  . Financial resource strain: Not on file  . Food insecurity:    Worry: Not on file    Inability: Not on file  . Transportation needs:    Medical: Not on file    Non-medical: Not on file  Tobacco Use  . Smoking status: Never Smoker  . Smokeless tobacco: Never Used  Substance and Sexual Activity  . Alcohol use: No    Alcohol/week: 0.0 oz  . Drug use: No  . Sexual activity: Never  Lifestyle  . Physical activity:    Days per week: Not on file    Minutes per session: Not on file  . Stress: Not on file  Relationships  . Social connections:    Talks on phone: Not on file    Gets together: Not on file    Attends religious service: Not on file    Active member of club or organization: Not on file    Attends meetings of clubs or organizations: Not on file    Relationship status: Not on file  Other Topics Concern  . Not on file  Social History Narrative   Lives with sister, no pets   Occupation: disabled, for bipolar and arthritis   Edu: GED   Activity:  take walks   Diet: good water, vegetables daily   Religion: [place], Dr. Jimmye Norman (ph [phone])   Additional Social History:    Allergies:   Allergies  Allergen Reactions  . Risperidone And Related Other (See Comments)    Reaction:  Altered mental status   . Penicillins Other (See Comments)    Pt states that this med knocks her out.  Has patient had a PCN reaction causing immediate rash, facial/tongue/throat swelling, SOB or lightheadedness with hypotension Unsure  Has patient had a PCN reaction causing severe rash involving mucus membranes or skin necrosis Unsure  Has patient had a PCN reaction that required hospitalization Unsure  Has patient had a PCN reaction occurring within the last 10 years Unsure  If all of the above answers are "NO", then may proceed with Cephalosporin use.  Clementeen Hoof [Iodinated Diagnostic Agents] Rash  . Tetanus Toxoids Rash       . Zetia [Ezetimibe] Rash    Labs:  Results for orders placed or performed during the hospital encounter of 11/03/17 (from the past 48 hour(s))  Comprehensive metabolic panel     Status: Abnormal   Collection Time: 11/03/17  1:03 PM  Result Value Ref Range   Sodium 139 135 - 145 mmol/L   Potassium 5.5 (H) 3.5 - 5.1 mmol/L   Chloride 104 101 - 111 mmol/L   CO2 21 (L) 22 - 32 mmol/L   Glucose, Bld 128 (H) 65 - 99 mg/dL   BUN 44 (H) 6 - 20 mg/dL   Creatinine, Ser 2.00 (H) 0.44 - 1.00 mg/dL   Calcium 10.4 (H) 8.9 - 10.3 mg/dL   Total Protein 7.7 6.5 - 8.1 g/dL   Albumin 4.4 3.5 - 5.0 g/dL   AST 28 15 - 41 U/L   ALT 25 14 - 54 U/L   Alkaline Phosphatase 70 38 - 126 U/L  Total Bilirubin 0.6 0.3 - 1.2 mg/dL   GFR calc non Af Amer 25 (L) >60 mL/min   GFR calc Af Amer 29 (L) >60 mL/min    Comment: (NOTE) The eGFR has been calculated using the CKD EPI equation. This calculation has not been validated in all clinical situations. eGFR's persistently <60 mL/min signify possible  Chronic Kidney Disease.    Anion gap 14 5 - 15    Comment: Performed at [facility], [address] [place], [place] [address]  Ethanol     Status: None   Collection Time: 11/03/17  1:03 PM  Result Value Ref Range   Alcohol, Ethyl (B) <10 <10 mg/dL    Comment:        LOWEST DETECTABLE LIMIT FOR SERUM ALCOHOL IS 10 mg/dL FOR MEDICAL PURPOSES ONLY Performed at [facility], [address] [place], [place] [address]   Salicylate level     Status: None   Collection Time: 11/03/17  1:03 PM  Result Value Ref Range   Salicylate Lvl <0.8 2.8 - 30.0 mg/dL    Comment: Performed at [facility], [address] [place], [place] [address]  Acetaminophen level     Status: Abnormal   Collection Time: 11/03/17  1:03 PM  Result Value Ref Range   Acetaminophen (Tylenol), Serum <10 (L) 10 - 30 ug/mL    Comment:        THERAPEUTIC CONCENTRATIONS VARY SIGNIFICANTLY. A RANGE OF 10-30 ug/mL MAY BE AN EFFECTIVE CONCENTRATION FOR MANY PATIENTS. HOWEVER, SOME ARE BEST TREATED AT CONCENTRATIONS OUTSIDE THIS RANGE. ACETAMINOPHEN CONCENTRATIONS >150 ug/mL AT 4 HOURS AFTER INGESTION AND >50 ug/mL AT 12 HOURS AFTER INGESTION ARE OFTEN ASSOCIATED WITH TOXIC REACTIONS. Performed at [facility], [address] [place], [place] [address]   CBC     Status: Abnormal   Collection Time: 11/03/17  2:29 PM  Result Value Ref Range   WBC 8.7 3.6 - 11.0 K/uL   RBC 3.70 (L) 3.80 - 5.20 MIL/uL   Hemoglobin 12.3 12.0 - 16.0 g/dL   HCT 36.0 35.0 - 47.0 %   MCV 97.1 80.0 - 100.0 fL   MCH 33.3 26.0 - 34.0 pg   MCHC 34.3 32.0 - 36.0 g/dL   RDW 14.0 11.5 - 14.5 %   Platelets 148 (L) 150 - 440 K/uL    Comment: Performed at [facility], [address] [place], [place] [address]    No current facility-administered medications for this encounter.    Current Outpatient Medications  Medication Sig Dispense Refill  . ACCU-CHEK AVIVA PLUS test strip 1 each by  Other route daily. E11.21 100 each 3  . divalproex (DEPAKOTE) 500 MG DR tablet Take 1 tablet (500 mg total) by mouth 2 (two) times daily with a meal. 60 tablet 3  . lamoTRIgine (LAMICTAL) 25 MG tablet Take 25 mg by mouth daily.    Marland Kitchen levothyroxine (SYNTHROID, LEVOTHROID) 50 MCG tablet Take 1 tablet (50 mcg total) by mouth daily before breakfast. 30 tablet 11  . losartan (COZAAR) 50 MG tablet Take 1 tablet (50 mg total) by mouth daily. 30 tablet 11  . MELATONIN PO Take 1 tablet by mouth at bedtime.    . metFORMIN (GLUCOPHAGE) 500 MG tablet Take 1 tablet (500 mg total) by mouth daily with breakfast. 30 tablet 6  . metoprolol tartrate (LOPRESSOR) 25 MG tablet TAKE 1 & 1/2 TABLET BY MOUTH TWICE DAILY WITH FOOD 84 tablet 5  . nystatin (MYCOSTATIN/NYSTOP)  powder Apply topically 3 (three) times daily. 60 g 1  . paliperidone (INVEGA SUSTENNA) 234 MG/1.5ML SUSP injection Inject 234 mg into the muscle every 30 (thirty) days.    . polyethylene glycol powder (GLYCOLAX/MIRALAX) powder TAKE 17 GRAMS BY MOUTH TWICE DAILY AS NEEDED FOR MODERATE CONSTIPATION. 527 g 3  . QUEtiapine (SEROQUEL) 200 MG tablet Take 200 mg by mouth at bedtime.    . simvastatin (ZOCOR) 20 MG tablet Take 1 tablet (20 mg total) by mouth at bedtime. 30 tablet 11    Musculoskeletal: Strength & Muscle Tone: within normal limits Gait & Station: normal Patient leans: N/A  Psychiatric Specialty Exam: Physical Exam  Nursing note and vitals reviewed. Constitutional: She appears well-developed and well-nourished.  HENT:  Head: Normocephalic and atraumatic.  Eyes: Pupils are equal, round, and reactive to light. Conjunctivae are normal.  Neck: Normal range of motion.  Cardiovascular: Normal heart sounds.  Respiratory: Effort normal.  GI: Soft.  Musculoskeletal: Normal range of motion.  Neurological: She is alert.  Skin: Skin is warm and dry.  Psychiatric: Her mood appears anxious. Her speech is rapid and/or pressured. She is agitated.  She is not aggressive, not hyperactive and not combative. Thought content is paranoid. Cognition and memory are impaired. She expresses impulsivity. She expresses no homicidal and no suicidal ideation. She exhibits abnormal recent memory.    Review of Systems  Constitutional: Negative.   HENT: Negative.   Eyes: Negative.   Respiratory: Negative.   Cardiovascular: Negative.   Gastrointestinal: Negative.   Musculoskeletal: Negative.   Skin: Negative.   Neurological: Negative.   Psychiatric/Behavioral: Positive for depression, hallucinations and memory loss. Negative for substance abuse and suicidal ideas. The patient is nervous/anxious and has insomnia.     Temperature 98.4 F (36.9 C), temperature source Oral, height _0  (1.575 m), weight 198 lb (89.8 kg).Body mass index is 36.21 kg/m.  General Appearance: Casual  Eye Contact:  Fair  Speech:  Pressured  Volume:  Increased  Mood:  Anxious and Irritable  Affect:  Labile  Thought Process:  Goal Directed  Orientation:  Full (Time, Place, and Person)  Thought Content:  Logical and Rumination  Suicidal Thoughts:  No  Homicidal Thoughts:  No  Memory:  Immediate;   Fair Recent;   Fair Remote;   Fair  Judgement:  Fair  Insight:  Fair  Psychomotor Activity:  Decreased  Concentration:  Concentration: Fair  Recall:  [employer] of Knowledge:  Fair  Language:  Fair  Akathisia:  No  Handed:  Right  AIMS (if indicated):     Assets:  Desire for Improvement Housing Social Support  ADL's:  Impaired  Cognition:  WNL  Sleep:        Treatment Plan Summary: Daily contact with patient to assess and evaluate symptoms and progress in treatment, Medication management and Plan Patient with a history of bipolar disorder feels like she is having a nervous breakdown.  Describes labile mood poor sleep poor p.o. intake.  Seems to be dehydrated currently.  Evidence of poor self-care and psychosis.  Patient is requesting admission and is a  appropriate for the inpatient psychiatric ward.  Patient will be admitted to psychiatry.  Labs reviewed.  Orders will be completed for her current medication profile.  Patient agrees Case discussed with emergency room physician and TTS.  Disposition: Recommend psychiatric Inpatient admission when medically cleared. Supportive therapy provided about ongoing stressors.  Alethia Berthold, MD 11/03/2017 3:34 PM

## 2017-11-03 NOTE — ED Notes (Signed)

## 2017-11-03 NOTE — ED Provider Notes (Signed)
EKG interpreted by me Normal sinus rhythm rate of 92, left axis, right bundle branch block, no acute ischemic changes. Unchanged compared to 04/12/2017.   Carrie Mew, MD 11/03/17 2055

## 2017-11-03 NOTE — ED Notes (Signed)
Patient in bathroom

## 2017-11-03 NOTE — ED Provider Notes (Signed)
[facility] Emergency Department Provider Note   ____________________________________________    I have reviewed the triage vital signs and the nursing notes.   HISTORY  Chief Complaint Anxiety and Insomnia     HPI ADIANNA DARWIN is a 67 y.o. female with a history of bipolar disorder who presents with complaints of racing thoughts and anxiety and difficulty sleeping.  Patient reports this is been going on for several days, she feels that she cannot concentrate.  Denies SI or HI.  Has not taken anything for this.  Has followed with psychiatry in the past for bipolar disorder.  Review of records illustrates a admission for mania in the past.  Physically patient has no complaints except some right knee pain which appears to be relatively chronic.  No palpitations   Past Medical History:  Diagnosis Date  . Anemia in chronic kidney disease   . Bipolar depression [facility])    sees psychiatrist - psych admission 03/2015  . Bladder cancer ([place]) 1990's  . Blood transfusion without reported diagnosis 2009  . Cataract    left  . CKD (chronic kidney disease) stage 3, GFR 30-59 ml/min (HCC)   . Colon cancer ([place]) 1990's  . DJD (degenerative joint disease), lumbar    chronic lower back pain  . Enterocutaneous fistula 04/07/2012   completed PT 06/2012 ([facility])  . Family history of breast cancer   . Family history of colon cancer   . Family history of stomach cancer   . GERD (gastroesophageal reflux disease)   . History of bladder cancer 1997  . History of colon cancer    s/p surgery  . History of uterine cancer    s/p hysterectomy  . Hyperlipidemia   . Hypertension   . IDA (iron deficiency anemia) 01/2013   thought due to h/o polyps  . Obesity (BMI 30-39.9)   . OSA on CPAP    6cm H2O  . Personal history of colonic adenomas and colon cancer 11/11/2012  . Positive hepatitis C antibody test 09/2014   but negative confirmatory testing  . RBBB   .  Uncontrolled type 2 diabetes mellitus with nephropathy ([place]) 06/07/2014   completed DSME 02/2016    Patient Active Problem List   Diagnosis Date Noted  . Gallbladder polyp 10/21/2017  . Left shoulder pain 10/21/2017  . Benign neoplasm of ascending colon   . Bipolar affective disorder, current episode hypomanic ([place]) 04/08/2017  . Low back pain radiating to left lower extremity 02/26/2017  . Acquired hypothyroidism 09/10/2016  . Thrombocytopenia ([place]) 06/15/2016  . Medicare annual wellness visit, subsequent 06/04/2016  . Advanced care planning/counseling discussion 06/04/2016  . External hemorrhoid 04/02/2016  . Slow transit constipation 01/04/2016  . Bipolar I disorder, most recent episode (or current) manic ([place]) 12/03/2015  . NAFLD (nonalcoholic fatty liver disease) 03/28/2015  . Gallbladder calculus without cholecystitis 03/28/2015  . Gout 03/27/2015  . HCV antibody positive 03/27/2015  . History of bladder cancer 03/27/2015  . History of bundle branch block 03/27/2015  . Genetic testing 02/28/2015  . Loss of memory 09/04/2014  . Controlled type 2 diabetes mellitus with diabetic nephropathy ([place]) 06/07/2014  . Hyperlipidemia   . Anemia in CKD (chronic kidney disease) 01/26/2013  . Personal history of colonic adenomas and colon cancer 11/11/2012  . Osteoarthritis of knee 10/02/2012  . OSA on CPAP   . CKD (chronic kidney disease) stage 3, GFR 30-59 ml/min (HCC)   . Severe obesity (BMI 35.0-39.9) with comorbidity ([place])   .  HTN (hypertension), benign 04/07/2012  . Hx of colon cancer, stage I 08/05/2011    Past Surgical History:  Procedure Laterality Date  . BREAST BIOPSY Right 01/2014   fibroadenoma  . COLONOSCOPY  07/2009  . COLONOSCOPY  11/2012   2 tubular adenomas, mild diverticulosis, pending genetic testing for Lynch syndrome [name]) rpt 2 yrs  . COLONOSCOPY  02/2015   TA, diverticulosis, rpt 2 yrs [name])  . COLONOSCOPY WITH PROPOFOL N/A 06/09/2017   TAx1, rpt 2  yrs [name], [name], MD)  . DEXA  12/2009   WNL  . DOBUTAMINE STRESS ECHO  12/2009   no evidence of ischemia  . HERNIA REPAIR  02/04/12  . i & d abdominal wound  02/19/12  . LEFT OOPHORECTOMY  2005  . PARTIAL COLECTOMY  about 2008   for colon cancer  . PARTIAL HYSTERECTOMY  1981   uterine cancer, R ovary remains  . Reexploration of abdominal wound and Allograft placemet  02/26/12  . Removal of infected mesh and abdominal wound vac placement  02/24/12  . sleep study  02/2014   OSA - AHI 55, nadir 81% [name])    Prior to Admission medications   Medication Sig Start Date End Date Taking? Authorizing Provider  ACCU-CHEK AVIVA PLUS test strip 1 each by Other route daily. E11.21 09/18/17   Ria Bush, MD  divalproex (DEPAKOTE) 500 MG DR tablet Take 1 tablet (500 mg total) by mouth 2 (two) times daily with a meal. 01/15/16   Ria Bush, MD  lamoTRIgine (LAMICTAL) 25 MG tablet Take 25 mg by mouth daily.    [provider]  levothyroxine (SYNTHROID, LEVOTHROID) 50 MCG tablet Take 1 tablet (50 mcg total) by mouth daily before breakfast. 10/21/17   Ria Bush, MD  losartan (COZAAR) 50 MG tablet Take 1 tablet (50 mg total) by mouth daily. 07/03/17   Ria Bush, MD  MELATONIN PO Take 1 tablet by mouth at bedtime.    [provider]  metFORMIN (GLUCOPHAGE) 500 MG tablet Take 1 tablet (500 mg total) by mouth daily with breakfast. 07/03/17   Ria Bush, MD  metoprolol tartrate (LOPRESSOR) 25 MG tablet TAKE 1 & 1/2 TABLET BY MOUTH TWICE DAILY WITH FOOD 08/24/17   Ria Bush, MD  nystatin (MYCOSTATIN/NYSTOP) powder Apply topically 3 (three) times daily. 04/27/17   Ria Bush, MD  paliperidone (INVEGA SUSTENNA) 234 MG/1.5ML SUSP injection Inject 234 mg into the muscle every 30 (thirty) days.    [provider]  polyethylene glycol powder (GLYCOLAX/MIRALAX) powder TAKE 17 GRAMS BY MOUTH TWICE DAILY AS NEEDED FOR MODERATE CONSTIPATION. 05/11/17    Ria Bush, MD  QUEtiapine (SEROQUEL) 200 MG tablet Take 200 mg by mouth at bedtime.    [provider]  simvastatin (ZOCOR) 20 MG tablet Take 1 tablet (20 mg total) by mouth at bedtime. 07/03/17   Ria Bush, MD     Allergies Risperidone and related; Penicillins; Ivp dye [iodinated diagnostic agents]; Tetanus toxoids; and Zetia [ezetimibe]  Family History  Problem Relation Age of Onset  . Colon cancer Mother 89  . Stomach cancer Mother        dx in her 22s?  . CAD Father        MI; deceased 61  . Mental illness Sister        anxiety/depression  . Thyroid disease Sister   . Breast cancer Maternal Grandmother        age at dx unknown  . Diabetes Brother   . Diabetes  Brother   . Diabetes Other        aunts and uncles both sides  . Arthritis Other        strong fmhx  . Colon cancer Other 64       maternal half-sister; deceased    Social History Social History   Tobacco Use  . Smoking status: Never Smoker  . Smokeless tobacco: Never Used  Substance Use Topics  . Alcohol use: No    Alcohol/week: 0.0 oz  . Drug use: No    Review of Systems  Constitutional: Difficulty sleeping Eyes: No visual changes.  ENT: No sore throat. Cardiovascular: Denies chest pain. Respiratory: Denies shortness of breath. Gastrointestinal: No abdominal pain.  No nausea, no vomiting.   Genitourinary: Negative for dysuria. Musculoskeletal: Knee pain as above Skin: Negative for rash. Neurological: Negative for headaches or weakness   ____________________________________________   PHYSICAL EXAM:  VITAL SIGNS: ED Triage Vitals  Enc Vitals Group     BP --      Pulse --      Resp --      Temp 11/03/17 1241 98.4 F (36.9 C)     Temp Source 11/03/17 1241 Oral     SpO2 --      Weight 11/03/17 1247 89.8 kg (198 lb)     Height 11/03/17 1247 1.575 m (5\' 2" )     Head Circumference --      Peak Flow --      Pain Score 11/03/17 1311 10     Pain Loc --      Pain  Edu? --      Excl. in [place]? --     Constitutional: Alert and oriented.  Pleasant and interactive Eyes: Conjunctivae are normal.   Nose: No congestion/rhinnorhea. Mouth/Throat: Mucous membranes are moist.    Cardiovascular: Normal rate, regular rhythm. Grossly normal heart sounds.  Good peripheral circulation. Respiratory: Normal respiratory effort.  No retractions. Lungs CTAB. Gastrointestinal: Soft and nontender. No distention.   Genitourinary: deferred Musculoskeletal: Full range of motion of both knees, no significant swelling.  Warm and well perfused Neurologic:  Normal speech and language. No gross focal neurologic deficits are appreciated.  Skin:  Skin is warm, dry and intact. No rash noted. Psychiatric: Patient with stable mood, however racing thoughts and somewhat anxious  ____________________________________________   LABS (all labs ordered are listed, but only abnormal results are displayed)  Labs Reviewed  COMPREHENSIVE METABOLIC PANEL - Abnormal; Notable for the following components:      Result Value   Potassium 5.5 (*)    CO2 21 (*)    Glucose, Bld 128 (*)    BUN 44 (*)    Creatinine, Ser 2.00 (*)    Calcium 10.4 (*)    GFR calc non Af Amer 25 (*)    GFR calc Af Amer 29 (*)    All other components within normal limits  ACETAMINOPHEN LEVEL - Abnormal; Notable for the following components:   Acetaminophen (Tylenol), Serum <10 (*)    All other components within normal limits  ETHANOL  SALICYLATE LEVEL  URINE DRUG SCREEN, QUALITATIVE ([facility] ONLY)  CBC   ____________________________________________  EKG   ____________________________________________  RADIOLOGY  None ____________________________________________   PROCEDURES  Procedure(s) performed: No  Procedures   Critical Care performed: No ____________________________________________   INITIAL IMPRESSION / ASSESSMENT AND PLAN / ED COURSE  Pertinent labs & imaging results that were  available during my care of the patient were reviewed by me and  considered in my medical decision making (see chart for details).  Patient with a history of bipolar presents with racing thoughts, difficulty concentration, difficulty sleeping, likely consistent with mania.  Will consult psychiatry.  On screening labs mild elevation in potassium, looking back this is essentially chronic for her.  I suspect related to mild dehydration we will give IV fluids.  Otherwise medically cleared    ____________________________________________   FINAL CLINICAL IMPRESSION(S) / ED DIAGNOSES  Final diagnoses:  Bipolar I disorder with mania ([place])  Chronic hyperkalemia        Note:  This document was prepared using Dragon voice recognition software and may include unintentional dictation errors.    Lavonia Drafts, MD 11/03/17 1431

## 2017-11-03 NOTE — ED Notes (Signed)
Patient was given a cup of ice water. 

## 2017-11-03 NOTE — ED Triage Notes (Signed)
Pt from home via ems. Pt states that "I feel like my mind is going crazy". Pt reports she "cant eat, cant sleep". Pt reports she always has problems off and on with her mental illness. Pt is bipolar and reports she has been taking prescribed meds as prescribed. Pt denies SI/HI. Cooperative in triage.

## 2017-11-04 ENCOUNTER — Other Ambulatory Visit: Payer: Self-pay

## 2017-11-04 ENCOUNTER — Inpatient Hospital Stay
Admission: AD | Admit: 2017-11-04 | Discharge: 2017-11-09 | DRG: 885 | Disposition: A | Discharge status: Home / Self Care | Payer: Medicare Other | Attending: Psychiatry | Admitting: Psychiatry

## 2017-11-04 DIAGNOSIS — N183 Chronic kidney disease, stage 3 (moderate): Secondary | ICD-10-CM | POA: Diagnosis present

## 2017-11-04 DIAGNOSIS — I129 Hypertensive chronic kidney disease with stage 1 through stage 4 chronic kidney disease, or unspecified chronic kidney disease: Secondary | ICD-10-CM | POA: Diagnosis present

## 2017-11-04 DIAGNOSIS — Z888 Allergy status to other drugs, medicaments and biological substances status: Secondary | ICD-10-CM

## 2017-11-04 DIAGNOSIS — Z91041 Radiographic dye allergy status: Secondary | ICD-10-CM | POA: Diagnosis not present

## 2017-11-04 DIAGNOSIS — Z880 Allergy status to penicillin: Secondary | ICD-10-CM

## 2017-11-04 DIAGNOSIS — Z9114 Patient's other noncompliance with medication regimen: Secondary | ICD-10-CM

## 2017-11-04 DIAGNOSIS — E1122 Type 2 diabetes mellitus with diabetic chronic kidney disease: Secondary | ICD-10-CM | POA: Diagnosis present

## 2017-11-04 DIAGNOSIS — M109 Gout, unspecified: Secondary | ICD-10-CM | POA: Diagnosis present

## 2017-11-04 DIAGNOSIS — F312 Bipolar disorder, current episode manic severe with psychotic features: Secondary | ICD-10-CM | POA: Diagnosis not present

## 2017-11-04 DIAGNOSIS — Z7984 Long term (current) use of oral hypoglycemic drugs: Secondary | ICD-10-CM | POA: Diagnosis not present

## 2017-11-04 DIAGNOSIS — G4700 Insomnia, unspecified: Secondary | ICD-10-CM | POA: Diagnosis present

## 2017-11-04 DIAGNOSIS — E1121 Type 2 diabetes mellitus with diabetic nephropathy: Secondary | ICD-10-CM | POA: Diagnosis present

## 2017-11-04 DIAGNOSIS — Z9989 Dependence on other enabling machines and devices: Secondary | ICD-10-CM | POA: Diagnosis not present

## 2017-11-04 DIAGNOSIS — Z85038 Personal history of other malignant neoplasm of large intestine: Secondary | ICD-10-CM | POA: Diagnosis not present

## 2017-11-04 DIAGNOSIS — F3110 Bipolar disorder, current episode manic without psychotic features, unspecified: Secondary | ICD-10-CM | POA: Diagnosis not present

## 2017-11-04 DIAGNOSIS — M25569 Pain in unspecified knee: Secondary | ICD-10-CM | POA: Diagnosis not present

## 2017-11-04 DIAGNOSIS — Z887 Allergy status to serum and vaccine status: Secondary | ICD-10-CM

## 2017-11-04 DIAGNOSIS — E785 Hyperlipidemia, unspecified: Secondary | ICD-10-CM | POA: Diagnosis present

## 2017-11-04 DIAGNOSIS — Z8542 Personal history of malignant neoplasm of other parts of uterus: Secondary | ICD-10-CM | POA: Diagnosis not present

## 2017-11-04 DIAGNOSIS — Z8551 Personal history of malignant neoplasm of bladder: Secondary | ICD-10-CM

## 2017-11-04 DIAGNOSIS — I10 Essential (primary) hypertension: Secondary | ICD-10-CM | POA: Diagnosis present

## 2017-11-04 DIAGNOSIS — G4733 Obstructive sleep apnea (adult) (pediatric): Secondary | ICD-10-CM | POA: Diagnosis present

## 2017-11-04 DIAGNOSIS — E039 Hypothyroidism, unspecified: Secondary | ICD-10-CM | POA: Diagnosis present

## 2017-11-04 DIAGNOSIS — R9431 Abnormal electrocardiogram [ECG] [EKG]: Secondary | ICD-10-CM | POA: Diagnosis not present

## 2017-11-04 DIAGNOSIS — Z79899 Other long term (current) drug therapy: Secondary | ICD-10-CM

## 2017-11-04 LAB — GLUCOSE, CAPILLARY: Glucose-Capillary: 102 mg/dL — ABNORMAL HIGH (ref 65–99)

## 2017-11-04 MED ORDER — LOSARTAN POTASSIUM 50 MG PO TABS
50.0000 mg | ORAL_TABLET | Freq: Every day | ORAL | Status: DC
  Administered 2017-11-05 – 2017-11-09 (×5): 50 mg via ORAL
  Filled 2017-11-04 (×5): qty 1

## 2017-11-04 MED ORDER — SIMVASTATIN 40 MG PO TABS
20.0000 mg | ORAL_TABLET | Freq: Every day | ORAL | Status: DC
  Administered 2017-11-05 – 2017-11-08 (×4): 20 mg via ORAL
  Filled 2017-11-04 (×4): qty 1

## 2017-11-04 MED ORDER — TRAZODONE HCL 100 MG PO TABS
100.0000 mg | ORAL_TABLET | Freq: Every evening | ORAL | Status: DC | PRN
Start: 2017-11-04 — End: 2017-11-09
  Administered 2017-11-04 – 2017-11-07 (×3): 100 mg via ORAL
  Filled 2017-11-04 (×3): qty 1

## 2017-11-04 MED ORDER — ACETAMINOPHEN 325 MG PO TABS
650.0000 mg | ORAL_TABLET | Freq: Four times a day (QID) | ORAL | Status: DC | PRN
  Administered 2017-11-04 – 2017-11-08 (×6): 650 mg via ORAL
  Filled 2017-11-04 (×6): qty 2

## 2017-11-04 MED ORDER — ALUM & MAG HYDROXIDE-SIMETH 200-200-20 MG/5ML PO SUSP
30.0000 mL | ORAL | Status: DC | PRN
  Administered 2017-11-07: 30 mL via ORAL
  Filled 2017-11-04: qty 30

## 2017-11-04 MED ORDER — QUETIAPINE FUMARATE 25 MG PO TABS: 300.0000 mg | ORAL_TABLET | Freq: Every day | ORAL | Status: DC

## 2017-11-04 MED ORDER — DIVALPROEX SODIUM 500 MG PO DR TAB
500.0000 mg | DELAYED_RELEASE_TABLET | Freq: Two times a day (BID) | ORAL | Status: DC
  Administered 2017-11-05: 500 mg via ORAL
  Filled 2017-11-04: qty 1

## 2017-11-04 MED ORDER — METOPROLOL TARTRATE 25 MG PO TABS
37.5000 mg | ORAL_TABLET | Freq: Two times a day (BID) | ORAL | Status: DC
  Administered 2017-11-05 – 2017-11-09 (×7): 37.5 mg via ORAL
  Filled 2017-11-04 (×9): qty 2

## 2017-11-04 MED ORDER — QUETIAPINE FUMARATE 200 MG PO TABS
300.0000 mg | ORAL_TABLET | Freq: Every day | ORAL | Status: DC
  Administered 2017-11-05 – 2017-11-08 (×5): 300 mg via ORAL
  Filled 2017-11-04 (×5): qty 1

## 2017-11-04 MED ORDER — METFORMIN HCL 500 MG PO TABS
500.0000 mg | ORAL_TABLET | Freq: Every day | ORAL | Status: DC
  Administered 2017-11-05 – 2017-11-09 (×5): 500 mg via ORAL
  Filled 2017-11-04 (×5): qty 1

## 2017-11-04 MED ORDER — LEVOTHYROXINE SODIUM 50 MCG PO TABS: 25.0000 ug | ORAL_TABLET | Freq: Every day | ORAL | Status: DC

## 2017-11-04 MED ORDER — MAGNESIUM HYDROXIDE 400 MG/5ML PO SUSP
30.0000 mL | Freq: Every day | ORAL | Status: DC | PRN
  Administered 2017-11-08: 30 mL via ORAL
  Filled 2017-11-04: qty 30

## 2017-11-04 MED ORDER — HYDROXYZINE HCL 50 MG PO TABS
50.0000 mg | ORAL_TABLET | Freq: Three times a day (TID) | ORAL | Status: DC | PRN
  Administered 2017-11-04: 50 mg via ORAL
  Filled 2017-11-04: qty 1

## 2017-11-04 NOTE — Progress Notes (Signed)
Patient ID: Gina [name], female   DOB: [birth year], 67 y.o.   MRN: [id number] Per State regulations 482.30 this chart was reviewed for medical necessity with respect to the patient's admission/duration of stay.    Next review date: 11/08/17  Debarah Crape, BSN, RN-BC  Case Manager

## 2017-11-04 NOTE — ED Notes (Signed)
Hourly rounding reveals patient in room. No complaints, stable, in no acute distress. Q15 minute rounds and monitoring via Security Cameras to continue. 

## 2017-11-04 NOTE — Plan of Care (Signed)
New admission  Problem: Education: Goal: Knowledge of [facility] Education information/materials will improve Outcome: Not Progressing Goal: Emotional status will improve Outcome: Not Progressing Goal: Mental status will improve Outcome: Not Progressing Goal: Verbalization of understanding the information provided will improve Outcome: Not Progressing   Problem: Activity: Goal: Interest or engagement in activities will improve Outcome: Not Progressing Goal: Sleeping patterns will improve Outcome: Not Progressing   Problem: Coping: Goal: Ability to verbalize frustrations and anger appropriately will improve Outcome: Not Progressing Goal: Ability to demonstrate self-control will improve Outcome: Not Progressing   Problem: Health Behavior/Discharge Planning: Goal: Identification of resources available to assist in meeting health care needs will improve Outcome: Not Progressing Goal: Compliance with treatment plan for underlying cause of condition will improve Outcome: Not Progressing   Problem: Safety: Goal: Periods of time without injury will increase Outcome: Not Progressing   Problem: Clinical Measurements: Goal: Ability to maintain clinical measurements within normal limits will improve Outcome: Not Progressing

## 2017-11-04 NOTE — Tx Team (Signed)
Initial Treatment Plan 11/04/2017 6:13 PM [name] [id number]    PATIENT STRESSORS: Medication change or noncompliance Other: Insomnia   PATIENT STRENGTHS: Ability for insight Average or above average intelligence General fund of knowledge Motivation for treatment/growth Religious Affiliation   PATIENT IDENTIFIED PROBLEMS: Racing thoughts  Irritabilty  Mood swings                 DISCHARGE CRITERIA:  Ability to meet basic life and health needs Adequate post-discharge living arrangements Improved stabilization in mood, thinking, and/or behavior  PRELIMINARY DISCHARGE PLAN: Outpatient therapy Return to previous living arrangement  PATIENT/FAMILY INVOLVEMENT: This treatment plan has been presented to and reviewed with the patient, Gina [name].  The patient has been given the opportunity to ask questions and make suggestions.  Keaten Mashek, RN 11/04/2017, 6:13 PM

## 2017-11-04 NOTE — ED Notes (Signed)
Pt continues to work on word search puzzles. Calm and cooperative. Denies SI/HI. When asked about visual hallucinations she pulled a picture of Jesus out of her pocket and stated, "I do see Jesus."  Maintained on 15 minute checks and observation by security camera for safety.

## 2017-11-04 NOTE — BH Assessment (Signed)
Patient is to be admitted to [facility] by Dr. Weber Cooks.  Attending Physician will be Dr. Bary Leriche.   Patient has been assigned to room 307, by [place] Nurse [place].  Intake Paper Work has been signed and placed on patient chart.  ER staff is aware of the admission:  Janus Molder   Dr. , ER MD   [name], Patient's Nurse   Genella Rife, Patient Access.

## 2017-11-04 NOTE — ED Notes (Signed)
Hourly rounding reveals patient sleeping in room. No complaints, stable, in no acute distress. Q15 minute rounds and monitoring via Security Cameras to continue. 

## 2017-11-04 NOTE — ED Notes (Addendum)
Hourly rounding reveals patient in room. No complaints, stable, in no acute distress. Q15 minute rounds and monitoring via Security Cameras to continue. 

## 2017-11-04 NOTE — Progress Notes (Addendum)
Patient ID: Gina [name], female   DOB: [birth year], 67 y.o.   MRN: [id number] Patient just finished dinner in the day room when approached; rough appearance, disheveled, poorly kept skin condition; when asked what brought her here, "I am at the verge of nervous breakdown, I stop sleeping,...";  snapping both thumbs in the air, demonstrating a sudden onset of her insomnia. Noted to be very polite, ending all her conversation with "Yes Sir". Humorous, giggled so loudly during the PRNs (Tyleno 650 mg PO PRN, 10/10 bilateral knee Osteoarthritis of knee pain; Trazodone 100 mg, sleep and Vistaril 50 mg, anxiety) at 2233. She denies SI/HI/AVH.

## 2017-11-04 NOTE — ED Notes (Signed)
Pt continues to work on word search puzzles. Maintained on 15 minute checks and observation by security camera for safety.

## 2017-11-04 NOTE — ED Notes (Signed)
Pt. Declined sleeping medication, requested and given word search puzzles.

## 2017-11-04 NOTE — ED Notes (Signed)
Pateint needs a soft diet

## 2017-11-04 NOTE — ED Notes (Signed)
Pt has signed voluntary consent forms to be admitted to BMU. All belongings will be sent with patient. Report called to [facility], Therapist, sports.

## 2017-11-04 NOTE — Consult Note (Signed)
[facility] Psychiatry Consult   Reason for Consult: Follow-up consult 67 year old woman with a history of bipolar disorder who has been awaiting bed availability so she can be admitted downstairs. Referring Physician: Corky Downs Patient Identification: Gina [name] MRN:  [id number] Principal Diagnosis: Bipolar I disorder, most recent episode (or current) manic ([employer] [place]) Diagnosis:   Patient Active Problem List   Diagnosis Date Noted  . Gallbladder polyp [K82.4] 10/21/2017  . Left shoulder pain [M25.512] 10/21/2017  . Benign neoplasm of ascending colon [D12.2]   . Bipolar affective disorder, current episode hypomanic ([place]) [F31.0] 04/08/2017  . Low back pain radiating to left lower extremity [M54.5, M79.605] 02/26/2017  . Acquired hypothyroidism [E03.9] 09/10/2016  . Thrombocytopenia ([place]) [D69.6] 06/15/2016  . Medicare annual wellness visit, subsequent [Z00.00] 06/04/2016  . Advanced care planning/counseling discussion [Z71.89] 06/04/2016  . External hemorrhoid [K64.4] 04/02/2016  . Slow transit constipation [K59.01] 01/04/2016  . Bipolar I disorder, most recent episode (or current) manic ([place]) [F31.10] 12/03/2015  . NAFLD (nonalcoholic fatty liver disease) [K76.0] 03/28/2015  . Gallbladder calculus without cholecystitis [K80.20] 03/28/2015  . Gout [M10.9] 03/27/2015  . HCV antibody positive [R76.8] 03/27/2015  . History of bladder cancer [Z85.51] 03/27/2015  . History of bundle branch block [Z86.79] 03/27/2015  . Genetic testing [Z13.79] 02/28/2015  . Loss of memory [R41.3] 09/04/2014  . Controlled type 2 diabetes mellitus with diabetic nephropathy ([place]) [E11.21] 06/07/2014  . Hyperlipidemia [E78.5]   . Anemia in CKD (chronic kidney disease) [N18.9, D63.1] 01/26/2013  . Personal history of colonic adenomas and colon cancer [Z86.010] 11/11/2012  . Osteoarthritis of knee [M17.10] 10/02/2012  . OSA on CPAP [G47.33, Z99.89]   . CKD (chronic kidney disease) stage 3, GFR 30-59  ml/min (HCC) [N18.3]   . Severe obesity (BMI 35.0-39.9) with comorbidity ([place]) [E66.01]   . HTN (hypertension), benign [I10] 04/07/2012  . Hx of colon cancer, stage I [Z85.038] 08/05/2011    Total Time spent with patient: 30 minutes  Subjective:   Gina [name] is a 67 y.o. female patient admitted with "not so good".  HPI: Patient seen and interviewed chart reviewed.  Patient says her mood is still feeling "like I cannot focus".  It is hard to follow exactly what she means by this.  She denies active suicidal intent and denies racing thoughts but says that she could not sleep last night because she "could not focus".  Patient is disorganized in her speech and still endorses some hallucinations and hyper religiosity.  Patient is also asking why her blood sugar is not being checked since she normally checks it every day at home.  Blood sugars have been normal consistently here in the hospital.  Past Psychiatric History: Long-standing bipolar disorder several prior hospitalizations  Risk to Self: Is patient at risk for suicide?: No Risk to Others:   Prior Inpatient Therapy:   Prior Outpatient Therapy:    Past Medical History:  Past Medical History:  Diagnosis Date  . Anemia in chronic kidney disease   . Bipolar depression [facility])    sees psychiatrist - psych admission 03/2015  . Bladder cancer ([place]) 1990's  . Blood transfusion without reported diagnosis 2009  . Cataract    left  . CKD (chronic kidney disease) stage 3, GFR 30-59 ml/min (HCC)   . Colon cancer ([place]) 1990's  . DJD (degenerative joint disease), lumbar    chronic lower back pain  . Enterocutaneous fistula 04/07/2012   completed PT 06/2012 ([facility])  . Family history of breast cancer   .  Family history of colon cancer   . Family history of stomach cancer   . GERD (gastroesophageal reflux disease)   . History of bladder cancer 1997  . History of colon cancer    s/p surgery  . History of uterine cancer    s/p  hysterectomy  . Hyperlipidemia   . Hypertension   . IDA (iron deficiency anemia) 01/2013   thought due to h/o polyps  . Obesity (BMI 30-39.9)   . OSA on CPAP    6cm H2O  . Personal history of colonic adenomas and colon cancer 11/11/2012  . Positive hepatitis C antibody test 09/2014   but negative confirmatory testing  . RBBB   . Uncontrolled type 2 diabetes mellitus with nephropathy ([place]) 06/07/2014   completed DSME 02/2016    Past Surgical History:  Procedure Laterality Date  . BREAST BIOPSY Right 01/2014   fibroadenoma  . COLONOSCOPY  07/2009  . COLONOSCOPY  11/2012   2 tubular adenomas, mild diverticulosis, pending genetic testing for Lynch syndrome [name]) rpt 2 yrs  . COLONOSCOPY  02/2015   TA, diverticulosis, rpt 2 yrs [name])  . COLONOSCOPY WITH PROPOFOL N/A 06/09/2017   TAx1, rpt 2 yrs [name], [name], MD)  . DEXA  12/2009   WNL  . DOBUTAMINE STRESS ECHO  12/2009   no evidence of ischemia  . HERNIA REPAIR  02/04/12  . i & d abdominal wound  02/19/12  . LEFT OOPHORECTOMY  2005  . PARTIAL COLECTOMY  about 2008   for colon cancer  . PARTIAL HYSTERECTOMY  1981   uterine cancer, R ovary remains  . Reexploration of abdominal wound and Allograft placemet  02/26/12  . Removal of infected mesh and abdominal wound vac placement  02/24/12  . sleep study  02/2014   OSA - AHI 55, nadir 81% [name])   Family History:  Family History  Problem Relation Age of Onset  . Colon cancer Mother 65  . Stomach cancer Mother        dx in her 1s?  . CAD Father        MI; deceased 53  . Mental illness Sister        anxiety/depression  . Thyroid disease Sister   . Breast cancer Maternal Grandmother        age at dx unknown  . Diabetes Brother   . Diabetes Brother   . Diabetes Other        aunts and uncles both sides  . Arthritis Other        strong fmhx  . Colon cancer Other 32       maternal half-sister; deceased   Family Psychiatric  History: Positive Social History:  Social  History   Substance and Sexual Activity  Alcohol Use No  . Alcohol/week: 0.0 oz     Social History   Substance and Sexual Activity  Drug Use No    Social History   Socioeconomic History  . Marital status: Single    Spouse name: Not on file  . Number of children: 0  . Years of education: Not on file  . Highest education level: Not on file  Occupational History  . Not on file  Social Needs  . Financial resource strain: Not on file  . Food insecurity:    Worry: Not on file    Inability: Not on file  . Transportation needs:    Medical: Not on file    Non-medical: Not on file  Tobacco Use  .  Smoking status: Never Smoker  . Smokeless tobacco: Never Used  Substance and Sexual Activity  . Alcohol use: No    Alcohol/week: 0.0 oz  . Drug use: No  . Sexual activity: Never  Lifestyle  . Physical activity:    Days per week: Not on file    Minutes per session: Not on file  . Stress: Not on file  Relationships  . Social connections:    Talks on phone: Not on file    Gets together: Not on file    Attends religious service: Not on file    Active member of club or organization: Not on file    Attends meetings of clubs or organizations: Not on file    Relationship status: Not on file  Other Topics Concern  . Not on file  Social History Narrative   Lives with sister, no pets   Occupation: disabled, for bipolar and arthritis   Edu: GED   Activity: take walks   Diet: good water, vegetables daily   Religion: [place], Dr. Jimmye Norman (ph [phone])   Additional Social History:    Allergies:   Allergies  Allergen Reactions  . Risperidone And Related Other (See Comments)    Reaction:  Altered mental status   . Penicillins Other (See Comments)    Pt states that this med knocks her out.  Has patient had a PCN reaction causing immediate rash, facial/tongue/throat swelling, SOB or lightheadedness with hypotension Unsure  Has patient had  a PCN reaction causing severe rash involving mucus membranes or skin necrosis Unsure  Has patient had a PCN reaction that required hospitalization Unsure  Has patient had a PCN reaction occurring within the last 10 years Unsure  If all of the above answers are "NO", then may proceed with Cephalosporin use.  Clementeen Hoof [Iodinated Diagnostic Agents] Rash  . Tetanus Toxoids Rash       . Zetia [Ezetimibe] Rash    Labs:  Results for orders placed or performed during the hospital encounter of 11/03/17 (from the past 48 hour(s))  Comprehensive metabolic panel     Status: Abnormal   Collection Time: 11/03/17  1:03 PM  Result Value Ref Range   Sodium 139 135 - 145 mmol/L   Potassium 5.5 (H) 3.5 - 5.1 mmol/L   Chloride 104 101 - 111 mmol/L   CO2 21 (L) 22 - 32 mmol/L   Glucose, Bld 128 (H) 65 - 99 mg/dL   BUN 44 (H) 6 - 20 mg/dL   Creatinine, Ser 2.00 (H) 0.44 - 1.00 mg/dL   Calcium 10.4 (H) 8.9 - 10.3 mg/dL   Total Protein 7.7 6.5 - 8.1 g/dL   Albumin 4.4 3.5 - 5.0 g/dL   AST 28 15 - 41 U/L   ALT 25 14 - 54 U/L   Alkaline Phosphatase 70 38 - 126 U/L   Total Bilirubin 0.6 0.3 - 1.2 mg/dL   GFR calc non Af Amer 25 (L) >60 mL/min   GFR calc Af Amer 29 (L) >60 mL/min    Comment: (NOTE) The eGFR has been calculated using the CKD EPI equation. This calculation has not been validated in all clinical situations. eGFR's persistently <60 mL/min signify possible Chronic Kidney Disease.    Anion gap 14 5 - 15    Comment: Performed at [facility], [address] [place], [place] [address]  Ethanol     Status: None  Collection Time: 11/03/17  1:03 PM  Result Value Ref Range   Alcohol, Ethyl (B) <10 <10 mg/dL    Comment:        LOWEST DETECTABLE LIMIT FOR SERUM ALCOHOL IS 10 mg/dL FOR MEDICAL PURPOSES ONLY Performed at [facility], [address] [place], [place] [address]   Salicylate level     Status: None   Collection Time: 11/03/17  1:03 PM  Result Value Ref  Range   Salicylate Lvl <5.0 2.8 - 30.0 mg/dL    Comment: Performed at [facility], [address] [place], [place] [address]  Acetaminophen level     Status: Abnormal   Collection Time: 11/03/17  1:03 PM  Result Value Ref Range   Acetaminophen (Tylenol), Serum <10 (L) 10 - 30 ug/mL    Comment:        THERAPEUTIC CONCENTRATIONS VARY SIGNIFICANTLY. A RANGE OF 10-30 ug/mL MAY BE AN EFFECTIVE CONCENTRATION FOR MANY PATIENTS. HOWEVER, SOME ARE BEST TREATED AT CONCENTRATIONS OUTSIDE THIS RANGE. ACETAMINOPHEN CONCENTRATIONS >150 ug/mL AT 4 HOURS AFTER INGESTION AND >50 ug/mL AT 12 HOURS AFTER INGESTION ARE OFTEN ASSOCIATED WITH TOXIC REACTIONS. Performed at [facility], [address] [place], [place] [address]   CBC     Status: Abnormal   Collection Time: 11/03/17  2:29 PM  Result Value Ref Range   WBC 8.7 3.6 - 11.0 K/uL   RBC 3.70 (L) 3.80 - 5.20 MIL/uL   Hemoglobin 12.3 12.0 - 16.0 g/dL   HCT 36.0 35.0 - 47.0 %   MCV 97.1 80.0 - 100.0 fL   MCH 33.3 26.0 - 34.0 pg   MCHC 34.3 32.0 - 36.0 g/dL   RDW 14.0 11.5 - 14.5 %   Platelets 148 (L) 150 - 440 K/uL    Comment: Performed at [facility], [address], [place],  [address]  Urine Drug Screen, Qualitative     Status: None   Collection Time: 11/03/17  6:45 PM  Result Value Ref Range   Tricyclic, Ur Screen NONE DETECTED NONE DETECTED   Amphetamines, Ur Screen NONE DETECTED NONE DETECTED   MDMA (Ecstasy)Ur Screen NONE DETECTED NONE DETECTED   Cocaine Metabolite,Ur Cut Off NONE DETECTED NONE DETECTED   Opiate, Ur Screen NONE DETECTED NONE DETECTED   Phencyclidine (PCP) Ur S NONE DETECTED NONE DETECTED   Cannabinoid 50 Ng, Ur [place] NONE DETECTED NONE DETECTED   Barbiturates, Ur Screen NONE DETECTED NONE DETECTED   Benzodiazepine, Ur Scrn NONE DETECTED NONE DETECTED   Methadone Scn, Ur NONE DETECTED NONE DETECTED    Comment: (NOTE) Tricyclics + metabolites, urine    Cutoff 1000  ng/mL Amphetamines + metabolites, urine  Cutoff 1000 ng/mL MDMA (Ecstasy), urine              Cutoff 500 ng/mL Cocaine Metabolite, urine          Cutoff 300 ng/mL Opiate + metabolites, urine        Cutoff 300 ng/mL Phencyclidine (PCP), urine         Cutoff 25 ng/mL Cannabinoid, urine                 Cutoff 50 ng/mL Barbiturates + metabolites, urine  Cutoff 200 ng/mL Benzodiazepine, urine              Cutoff 200 ng/mL Methadone, urine                   Cutoff 300 ng/mL The urine drug screen provides  only a preliminary, unconfirmed analytical test result and should not be used for non-medical purposes. Clinical consideration and professional judgment should be applied to any positive drug screen result due to possible interfering substances. A more specific alternate chemical method must be used in order to obtain a confirmed analytical result. Gas chromatography / mass spectrometry (GC/MS) is the preferred confirmat ory method. Performed at [facility], [address] [place], [place] [address]   Basic metabolic panel     Status: Abnormal   Collection Time: 11/03/17  6:50 PM  Result Value Ref Range   Sodium 141 135 - 145 mmol/L   Potassium 4.8 3.5 - 5.1 mmol/L   Chloride 106 101 - 111 mmol/L   CO2 25 22 - 32 mmol/L   Glucose, Bld 206 (H) 65 - 99 mg/dL   BUN 40 (H) 6 - 20 mg/dL   Creatinine, Ser 1.84 (H) 0.44 - 1.00 mg/dL   Calcium 9.8 8.9 - 10.3 mg/dL   GFR calc non Af Amer 27 (L) >60 mL/min   GFR calc Af Amer 32 (L) >60 mL/min    Comment: (NOTE) The eGFR has been calculated using the CKD EPI equation. This calculation has not been validated in all clinical situations. eGFR's persistently <60 mL/min signify possible Chronic Kidney Disease.    Anion gap 10 5 - 15    Comment: Performed at [facility], [address] [place], [place] [address]  Urinalysis, Routine w reflex microscopic     Status: Abnormal   Collection Time: 11/03/17  8:01 PM  Result  Value Ref Range   Color, Urine STRAW (A) YELLOW   APPearance CLEAR (A) CLEAR   Specific Gravity, Urine 1.011 1.005 - 1.030   pH 6.0 5.0 - 8.0   Glucose, UA NEGATIVE NEGATIVE mg/dL   Hgb urine dipstick SMALL (A) NEGATIVE   Bilirubin Urine NEGATIVE NEGATIVE   Ketones, ur NEGATIVE NEGATIVE mg/dL   Protein, ur NEGATIVE NEGATIVE mg/dL   Nitrite NEGATIVE NEGATIVE   Leukocytes, UA NEGATIVE NEGATIVE   RBC / HPF 0-5 0 - 5 RBC/hpf   WBC, UA 0-5 0 - 5 WBC/hpf   Bacteria, UA NONE SEEN NONE SEEN   Squamous Epithelial / LPF 0-5 (A) NONE SEEN    Comment: Performed at [facility], [address] [place], [place] [address]    Current Facility-Administered Medications  Medication Dose Route Frequency Provider Last Rate Last Dose  . divalproex (DEPAKOTE) DR tablet 500 mg  500 mg Oral Q12H Stanley Helmuth, Madie Reno, MD   500 mg at 11/04/17 0936  . levothyroxine (SYNTHROID, LEVOTHROID) tablet 25 mcg  25 mcg Oral QAC breakfast Kashis Penley, Madie Reno, MD   25 mcg at 11/04/17 [phone]  . losartan (COZAAR) tablet 50 mg  50 mg Oral Daily Kamaryn Grimley, Madie Reno, MD   50 mg at 11/04/17 0937  . metFORMIN (GLUCOPHAGE) tablet 500 mg  500 mg Oral Q breakfast Kofi Murrell, Madie Reno, MD   500 mg at 11/04/17 0936  . metoprolol tartrate (LOPRESSOR) tablet 37.5 mg  37.5 mg Oral BID Edina Winningham T, MD   37.5 mg at 11/04/17 0935  . QUEtiapine (SEROQUEL) tablet 300 mg  300 mg Oral QHS Nate Perri T, MD      . simvastatin (ZOCOR) tablet 20 mg  20 mg Oral q1800 Reinhart Saulters, Madie Reno, MD       Current Outpatient Medications  Medication Sig Dispense Refill  . ACCU-CHEK AVIVA PLUS test strip 1 each by Other route  daily. E11.21 100 each 3  . divalproex (DEPAKOTE) 500 MG DR tablet Take 1 tablet (500 mg total) by mouth 2 (two) times daily with a meal. 60 tablet 3  . lamoTRIgine (LAMICTAL) 25 MG tablet Take 25 mg by mouth daily.    Marland Kitchen levothyroxine (SYNTHROID, LEVOTHROID) 50 MCG tablet Take 1 tablet (50 mcg total) by mouth daily before breakfast. 30 tablet  11  . losartan (COZAAR) 50 MG tablet Take 1 tablet (50 mg total) by mouth daily. 30 tablet 11  . MELATONIN PO Take 1 tablet by mouth at bedtime.    . metFORMIN (GLUCOPHAGE) 500 MG tablet Take 1 tablet (500 mg total) by mouth daily with breakfast. 30 tablet 6  . metoprolol tartrate (LOPRESSOR) 25 MG tablet TAKE 1 & 1/2 TABLET BY MOUTH TWICE DAILY WITH FOOD 84 tablet 5  . nystatin (MYCOSTATIN/NYSTOP) powder Apply topically 3 (three) times daily. 60 g 1  . paliperidone (INVEGA SUSTENNA) 234 MG/1.5ML SUSP injection Inject 234 mg into the muscle every 30 (thirty) days.    . polyethylene glycol powder (GLYCOLAX/MIRALAX) powder TAKE 17 GRAMS BY MOUTH TWICE DAILY AS NEEDED FOR MODERATE CONSTIPATION. 527 g 3  . QUEtiapine (SEROQUEL) 200 MG tablet Take 200 mg by mouth at bedtime.    . simvastatin (ZOCOR) 20 MG tablet Take 1 tablet (20 mg total) by mouth at bedtime. 30 tablet 11    Musculoskeletal: Strength & Muscle Tone: within normal limits Gait & Station: normal Patient leans: N/A  Psychiatric Specialty Exam: Physical Exam  Nursing note and vitals reviewed. Constitutional: She appears well-developed and well-nourished.  HENT:  Head: Normocephalic and atraumatic.  Eyes: Pupils are equal, round, and reactive to light. Conjunctivae are normal.  Neck: Normal range of motion.  Cardiovascular: Regular rhythm and normal heart sounds.  Respiratory: Effort normal. No respiratory distress.  GI: Soft. She exhibits no distension.  Musculoskeletal: Normal range of motion.  Neurological: She is alert.  Skin: Skin is warm and dry.  Psychiatric: Her affect is blunt. Her speech is delayed. She is slowed. Thought content is paranoid. Cognition and memory are impaired. She expresses impulsivity. She expresses no homicidal and no suicidal ideation.    Review of Systems  Constitutional: Negative.   HENT: Negative.   Eyes: Negative.   Respiratory: Negative.   Cardiovascular: Negative.   Gastrointestinal:  Negative.   Musculoskeletal: Negative.   Skin: Negative.   Neurological: Negative.   Psychiatric/Behavioral: Positive for hallucinations. Negative for depression, substance abuse and suicidal ideas. The patient is nervous/anxious and has insomnia.     Blood pressure 138/75, pulse 90, temperature 98 F (36.7 C), temperature source Oral, resp. rate 16, height 5' 2"  (1.575 m), weight 89.8 kg (198 lb), SpO2 96 %.Body mass index is 36.21 kg/m.  General Appearance: Casual  Eye Contact:  Good  Speech:  Slow  Volume:  Decreased  Mood:  Anxious and Depressed  Affect:  Constricted  Thought Process:  Disorganized  Orientation:  Full (Time, Place, and Person)  Thought Content:  Illogical  Suicidal Thoughts:  No  Homicidal Thoughts:  No  Memory:  Immediate;   Fair Recent;   Fair Remote;   Fair  Judgement:  Fair  Insight:  Fair  Psychomotor Activity:  Decreased  Concentration:  Concentration: Fair  Recall:  [employer] of Knowledge:  Fair  Language:  Fair  Akathisia:  No  Handed:  Right  AIMS (if indicated):     Assets:  Desire for [place]  ADL's:  Intact  Cognition:  Impaired,  Mild  Sleep:        Treatment Plan Summary: Daily contact with patient to assess and evaluate symptoms and progress in treatment, Medication management and Plan Increase dose of Seroquel to 300 mg at night for complaints of poor sleep and still having "unfocused" thoughts.  Patient is still pending admission and we hope that a bed availability will come up in today.  I doubt that the patient really needs her blood sugars checked that often but she is anxious about it.  I will order a morning blood sugar checks although I do not think we need to worry about insulin at this point.  Patient agreeable to the plan case reviewed with nursing.  Disposition: Recommend psychiatric Inpatient admission when medically cleared.  Alethia Berthold, MD 11/04/2017 2:11 PM

## 2017-11-04 NOTE — Progress Notes (Signed)
Admission Note:   Report was given by [name], RN on a 67 year old female who presents Voluntary in no acute distress for the treatment of racing thoughts, irritability, and mood swings. Patient appears flat and depressed. Patient was calm and cooperative with admission process. Patient denies SI/HI/AVH on admission. Patient states to this writer "sometimes I'm crying and sometimes I'm laughing". Patient states "my memory is gone and I'm claustrophobic, I can't be boxed in". Patient has a past medical history of DM without complication. Her CBG was 102 upon admission. Patient states that she has "weak knees" and has experienced "dizzy spells". Patient states she lives with her sister, but "she's sick and I'm sick". Patient's goals for while she's here is "get my thoughts together, and learn how to read and write". Patient is very religious and likes to pray. Skin was assessed and found to be clear of any abnormal marks apart from a scar on the midline of her abdomen stating "I had surgery, I have hernias". Patient searched and no contraband found and unit policies explained and understanding verbalized. Consents obtained. Food and fluids offered, and accepted. Patient had no additional questions or concerns at this time.

## 2017-11-04 NOTE — ED Provider Notes (Signed)
-----------------------------------------   7:30 AM on 11/04/2017 -----------------------------------------   Blood pressure (!) 149/83, pulse 92, temperature 98 F (36.7 C), temperature source Oral, resp. rate 16, height 5\' 2"  (1.575 m), weight 89.8 kg (198 lb), SpO2 96 %.  The patient had no acute events since last update.  Calm and cooperative at this time.  Disposition is pending Psychiatry/Behavioral Medicine team recommendations.     Darel Hong, MD 11/04/17 0730

## 2017-11-05 DIAGNOSIS — F312 Bipolar disorder, current episode manic severe with psychotic features: Principal | ICD-10-CM

## 2017-11-05 LAB — GLUCOSE, CAPILLARY: Glucose-Capillary: 132 mg/dL — ABNORMAL HIGH (ref 65–99)

## 2017-11-05 LAB — VALPROIC ACID LEVEL: Valproic Acid Lvl: 50 ug/mL (ref 50.0–100.0)

## 2017-11-05 MED ORDER — PALIPERIDONE PALMITATE 234 MG/1.5ML IM SUSP
234.0000 mg | INTRAMUSCULAR | Status: DC
  Filled 2017-11-05: qty 1.5

## 2017-11-05 MED ORDER — LEVOTHYROXINE SODIUM 50 MCG PO TABS
25.0000 ug | ORAL_TABLET | Freq: Every day | ORAL | Status: DC
  Administered 2017-11-05 – 2017-11-09 (×5): 25 ug via ORAL
  Filled 2017-11-05 (×5): qty 1

## 2017-11-05 MED ORDER — DIVALPROEX SODIUM 500 MG PO DR TAB
500.0000 mg | DELAYED_RELEASE_TABLET | Freq: Three times a day (TID) | ORAL | Status: DC
  Administered 2017-11-05 – 2017-11-09 (×13): 500 mg via ORAL
  Filled 2017-11-05 (×13): qty 1

## 2017-11-05 NOTE — Plan of Care (Signed)
  Problem: Education: Goal: Knowledge of [place] [facility] Education information/materials will improve Outcome: Progressing   Problem: Education: Goal: Emotional status will improve Outcome: Progressing   Problem: Education: Goal: Knowledge of [place] [facility] Education information/materials will improve Outcome: Progressing Goal: Emotional status will improve Outcome: Progressing Goal: Mental status will improve Outcome: Progressing Goal: Verbalization of understanding the information provided will improve Outcome: Progressing

## 2017-11-05 NOTE — Progress Notes (Signed)
Recreation Therapy Notes  Date: 11/05/2017  Time: 9:30 am  Location: Craft Room  Behavioral response: Appropriate  Intervention Topic: Team Work  Discussion/Intervention: Group content on today was focused on teamwork. The group identified what teamwork is. Individuals described who is a part of their team. Patients expressed why they thought teamwork is important. The group stated reasons why they thought it was easier to work with a Dance movement psychotherapist team. Individuals discussed some positives and negatives of working with a team. Patients gave examples of past experiences they had while working with a team. The group participated in the intervention "Story in a bag", patients were in groups and were able to test their skill in a team setting.   Clinical Observations/Feedback:  Patient came to group and define team work as someone that helps. She stated that her doctor and therapist are apart of her team. Makenzie Weisner LRT/CTRS         Tabor Denham 11/05/2017 11:37 AM

## 2017-11-05 NOTE — BHH Group Notes (Signed)
11/05/2017  Time: 1PM  Type of Therapy/Topic:  Group Therapy:  Balance in Life  Participation Level:  Active  Description of Group:   This group will address the concept of balance and how it feels and looks when one is unbalanced. Patients will be encouraged to process areas in their lives that are out of balance and identify reasons for remaining unbalanced. Facilitators will guide patients in utilizing problem-solving interventions to address and correct the stressor making their life unbalanced. Understanding and applying boundaries will be explored and addressed for obtaining and maintaining a balanced life. Patients will be encouraged to explore ways to assertively make their unbalanced needs known to significant others in their lives, using other group members and facilitator for support and feedback.  Therapeutic Goals: 1. Patient will identify two or more emotions or situations they have that consume much of in their lives. 2. Patient will identify signs/triggers that life has become out of balance:  3. Patient will identify two ways to set boundaries in order to achieve balance in their lives:  4. Patient will demonstrate ability to communicate their needs through discussion and/or role plays  Summary of Patient Progress: Pt continues to work towards their tx goals but has not yet reached them. Pt was able to appropriately participate in group discussion, and was able to offer support/validation to other group members. Pt reported she feels her mental health takes up too much of her attention. Pt reported one area of her life she would like to devote more attention to is, "getting my rest." Pt reported one way she can work towards a balanced life is to, "focus my mind."   Therapeutic Modalities:   Cognitive Behavioral Therapy Solution-Focused Therapy Assertiveness Training  Alden Hipp, MSW, LCSW Clinical Social Worker 11/05/2017 1:50 PM

## 2017-11-05 NOTE — Progress Notes (Signed)
Received Gina [name] this AM after breakfast, she was compliant with her medications. She denied all of the psychiatric psymptoms and stated she feels sleepy today related to not sleeping well last night. She is OOB at intervals in the milieu ar intervals with several requests.

## 2017-11-05 NOTE — BHH Counselor (Signed)
Adult Comprehensive Assessment  Patient ID: Gina [name], female   DOB: 1950/09/25, 67 y.o.   MRN: [id number]  Information Source: Information source: Patient  Current Stressors:  Educational / Learning stressors: None reported.  Employment / Job issues: Pt collects disability.  Family Relationships: Pt reports, "I only have my sister."  Financial / Lack of resources (include bankruptcy): No issues reported.  Housing / Lack of housing: Pt lives with her sister and is able to return upon discharge.  Physical health (include injuries & life threatening diseases): Pt reports a history of cancer. Pt also reports other medical problems, but was unable to provide specific information.  Social relationships: Pt reports, "I only have my sister. That's it."  Substance abuse: None reported.  Bereavement / Loss: No loss reported.   Living/Environment/Situation:  Living Arrangements: Other relatives(Pt lives with her sister. ) Living conditions (as described by patient or guardian): "Good. But, we don't know nobody down here."  How long has patient lived in current situation?: Six years  What is atmosphere in current home: Comfortable, Supportive, Loving  Family History:  Marital status: Single Are you sexually active?: No What is your sexual orientation?: "I'm just a regular girl." Pt was unable to elaborate on this response.  Has your sexual activity been affected by drugs, alcohol, medication, or emotional stress?: Pt denies.  Does patient have children?: No  Childhood History:  By whom was/is the patient raised?: Both parents Additional childhood history information: "My mom would put me in the hospital when I would act up. But, we had a great relationship." Description of patient's relationship with caregiver when they were a child: "I was very close with both of my parents."  Patient's description of current relationship with people who raised him/her: Both of pt's parents are  deceased.  How were you disciplined when you got in trouble as a child/adolescent?: "My mom would put me in the hospital. I never got spankings or nothing like that."  Does patient have siblings?: Yes Number of Siblings: 4 Description of patient's current relationship with siblings: Pt reports having two sisters with whom she has a good relationship, and two brothers who she states "she isn't close with anymore."  Did patient suffer any verbal/emotional/physical/sexual abuse as a child?: Yes Did patient suffer from severe childhood neglect?: No Has patient ever been sexually abused/assaulted/raped as an adolescent or adult?: No Was the patient ever a victim of a crime or a disaster?: No Spoken with a professional about abuse?: No Does patient feel these issues are resolved?: (N/A) Witnessed domestic violence?: No Has patient been effected by domestic violence as an adult?: No  Education:  Highest grade of school patient has completed: GED Currently a Ship broker?: No Learning disability?: No  Employment/Work Situation:   Employment situation: On disability Why is patient on disability: "mood swings"  How long has patient been on disability: "I was on disability but now I'm on social security."  Patient's job has been impacted by current illness: No What is the longest time patient has a held a job?: "over thirty years."  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 If attempted suicide, did drugs/alcohol play a role in this?: No Alcohol/Substance Abuse Treatment Hx: Denies past history Has alcohol/substance abuse ever caused legal problems?: No  Social Support System:   Patient's Community Support System: Fair Describe Community Support System: Pt reports her sister is very supportive, and she has an Aeronautical engineer with [facility].  Type of faith/religion: Pt reports being [employer].  How does patient's faith help to cope with current illness?: Prayer   Leisure/Recreation:   Leisure and Hobbies: "I used to collect [employer]. Now I like to do word searches and read the bible."   Strengths/Needs:   What things does the patient do well?: Family support In what areas does patient struggle / problems for patient: problems sleeping, limited insight.   Discharge Plan:   Does patient have access to transportation?: Yes Will patient be returning to same living situation after discharge?: Yes Currently receiving community mental health services: Yes (From Whom)([facility] ACTT) If no, would patient like referral for services when discharged?: (N/A) Does patient have financial barriers related to discharge medications?: No  Summary/Recommendations:   Summary and Recommendations (to be completed by the evaluator): Pt is a 67 year old female who presents to [facility] on a voluntary commitment. Pt reports, "I couldn't sleep and I was having dizzy spells." Pt reports decreased sleep and states she is "unable to focus." Pt states she, "cannot eat, cannot read, and cannot concentrate." Pt denies SI, HI, or AVH. Pt reports medication adherence; however, other assessments indicate pt has been non-adherent with some medications. Pt denies alcohol or substance use, and denies trauma/abuse history as well. Pt was unable to report when her last hospitalization was, but pt's record indicates it was in 2017. Pt currently receives ACTT services with [facility], and will continue these  services upon discharge. Pt lives with her sister and is able to return upon discharge. Pt was calm and cooperative with CSW during assessment. Pt's thoughts were disorganized and tangential at times, though pt was able to accept redirection. Pt's diagnosis is Bipolar Disorder. Current recommendations for this patient include: crisis stabilization, therapeutic milieu, encouragement to attend and participate in group therapy, and the development of a comprehensive mental wellness plan.   Alden Hipp, LCSW. 11/05/2017

## 2017-11-05 NOTE — Plan of Care (Signed)
Patient slept for Estimated Hours of 7.30; Precautionary checks every 15 minutes for safety maintained, room free of safety hazards, patient sustains no injury or falls during this shift.  Problem: Education: Goal: Knowledge of [facility] Education information/materials will improve Outcome: Progressing Goal: Emotional status will improve Outcome: Progressing Goal: Mental status will improve Outcome: Progressing Goal: Verbalization of understanding the information provided will improve Outcome: Progressing   Problem: Activity: Goal: Interest or engagement in activities will improve Outcome: Progressing Goal: Sleeping patterns will improve Outcome: Progressing   Problem: Coping: Goal: Ability to verbalize frustrations and anger appropriately will improve Outcome: Progressing Goal: Ability to demonstrate self-control will improve Outcome: Progressing   Problem: Health Behavior/Discharge Planning: Goal: Identification of resources available to assist in meeting health care needs will improve Outcome: Progressing Goal: Compliance with treatment plan for underlying cause of condition will improve Outcome: Progressing   Problem: Safety: Goal: Periods of time without injury will increase Outcome: Progressing   Problem: Clinical Measurements: Goal: Ability to maintain clinical measurements within normal limits will improve Outcome: Progressing

## 2017-11-05 NOTE — BHH Group Notes (Signed)
LCSW Group Therapy Note 11/05/2017 9:00 AM  Type of Therapy and Topic:  Group Therapy:  Setting Goals  Participation Level:  Did Not Attend  Description of Group: In this process group, patients discussed using strengths to work toward goals and address challenges.  Patients identified two positive things about themselves and one goal they were working on.  Patients were given the opportunity to share openly and support each other's plan for self-empowerment.  The group discussed the value of gratitude and were encouraged to have a daily reflection of positive characteristics or circumstances.  Patients were encouraged to identify a plan to utilize their strengths to work on current challenges and goals.  Therapeutic Goals 1. Patient will verbalize personal strengths/positive qualities and relate how these can assist with achieving desired personal goals 2. Patients will verbalize affirmation of peers plans for personal change and goal setting 3. Patients will explore the value of gratitude and positive focus as related to successful achievement of goals 4. Patients will verbalize a plan for regular reinforcement of personal positive qualities and circumstances.  Summary of Patient Progress:  Julliana was invited to today's group, but chose not to attend.     Therapeutic Modalities Cognitive Behavioral Therapy Motivational Interviewing    Devona Konig, LCSW 11/05/2017 10:20 AM

## 2017-11-05 NOTE — BHH Suicide Risk Assessment (Signed)
[facility] Admission Suicide Risk Assessment   Nursing information obtained from:  Patient Demographic factors:  Age 67 or older, Low socioeconomic status Current Mental Status:  NA Loss Factors:  NA Historical Factors:  NA Risk Reduction Factors:  Sense of responsibility to family, Religious beliefs about death, Living with another person, especially a relative  Total Time spent with patient: 1 hour Principal Problem: Bipolar I disorder, current or most recent episode manic, with psychotic features ([place]) Diagnosis:   Patient Active Problem List   Diagnosis Date Noted  . Bipolar I disorder, current or most recent episode manic, with psychotic features ([place]) [F31.2] 11/04/2017    Priority: High  . Gallbladder polyp [K82.4] 10/21/2017  . Left shoulder pain [M25.512] 10/21/2017  . Benign neoplasm of ascending colon [D12.2]   . Bipolar affective disorder, current episode hypomanic ([place]) [F31.0] 04/08/2017  . Low back pain radiating to left lower extremity [M54.5, M79.605] 02/26/2017  . Acquired hypothyroidism [E03.9] 09/10/2016  . Thrombocytopenia ([place]) [D69.6] 06/15/2016  . Medicare annual wellness visit, subsequent [Z00.00] 06/04/2016  . Advanced care planning/counseling discussion [Z71.89] 06/04/2016  . External hemorrhoid [K64.4] 04/02/2016  . Slow transit constipation [K59.01] 01/04/2016  . Bipolar I disorder, most recent episode (or current) manic ([place]) [F31.10] 12/03/2015  . NAFLD (nonalcoholic fatty liver disease) [K76.0] 03/28/2015  . Gallbladder calculus without cholecystitis [K80.20] 03/28/2015  . Gout [M10.9] 03/27/2015  . HCV antibody positive [R76.8] 03/27/2015  . History of bladder cancer [Z85.51] 03/27/2015  . History of bundle branch block [Z86.79] 03/27/2015  . Genetic testing [Z13.79] 02/28/2015  . Loss of memory [R41.3] 09/04/2014  . Controlled type 2 diabetes mellitus with diabetic nephropathy ([place]) [E11.21] 06/07/2014  . Hyperlipidemia [E78.5]   . Anemia in CKD  (chronic kidney disease) [N18.9, D63.1] 01/26/2013  . Personal history of colonic adenomas and colon cancer [Z86.010] 11/11/2012  . Osteoarthritis of knee [M17.10] 10/02/2012  . OSA on CPAP [G47.33, Z99.89]   . CKD (chronic kidney disease) stage 3, GFR 30-59 ml/min (H[facility]) [N18.3]   . Severe obesity (BMI 35.0-39.9) with comorbidity ([place]) [E66.01]   . HTN (hypertension), benign [I10] 04/07/2012  . Hx of colon cancer, stage I [Z85.038] 08/05/2011   Subjective Data: mania  Continued Clinical Symptoms:  Alcohol Use Disorder Identification Test Final Score (AUDIT): 0 The "Alcohol Use Disorders Identification Test", Guidelines for Use in Primary Care, Second Edition.  World Pharmacologist [facility]). Score between 0-7:  no or low risk or alcohol related problems. Score between 8-15:  moderate risk of alcohol related problems. Score between 16-19:  high risk of alcohol related problems. Score 20 or above:  warrants further diagnostic evaluation for alcohol dependence and treatment.   CLINICAL FACTORS:   Bipolar Disorder:   Mixed State Currently Psychotic Previous Psychiatric Diagnoses and Treatments   Musculoskeletal: Strength & Muscle Tone: within normal limits Gait & Station: normal Patient leans: N/A  Psychiatric Specialty Exam: Physical Exam  ROS  Blood pressure 109/72, pulse (!) 124, temperature 98 F (36.7 C), temperature source Oral, resp. rate 18, height 5' 2"  (1.575 m), weight 89.8 kg (198 lb), SpO2 100 %.Body mass index is 36.21 kg/m.  General Appearance: Casual  Eye Contact:  Good  Speech:  Pressured  Volume:  Normal  Mood:  Euphoric  Affect:  Congruent  Thought Process:  Goal Directed and Descriptions of Associations: Intact  Orientation:  Full (Time, Place, and Person)  Thought Content:  Illogical, Delusions, Hallucinations: Visual and Paranoid Ideation  Suicidal Thoughts:  No  Homicidal Thoughts:  No  Memory:  Immediate;   Fair Recent;   Fair Remote;   Fair   Judgement:  Poor  Insight:  Shallow  Psychomotor Activity:  Normal  Concentration:  Concentration: Fair and Attention Span: Fair  Recall:  [employer] of Knowledge:  Fair  Language:  Fair  Akathisia:  No  Handed:  Right  AIMS (if indicated):     Assets:  Communication Skills Desire for Improvement Financial Resources/Insurance Housing Resilience Social Support  ADL's:  Intact  Cognition:  WNL  Sleep:  Number of Hours: 7.3      COGNITIVE FEATURES THAT CONTRIBUTE TO RISK:  None    SUICIDE RISK:   Minimal: No identifiable suicidal ideation.  Patients presenting with no risk factors but with morbid ruminations; may be classified as minimal risk based on the severity of the depressive symptoms  PLAN OF CARE: hospital admission, medication management, discharge planning.  Ms. Zeiser is a 67 year old female with a history of bipolar disorder admitted in a manic episode in the context of medication noncompliance.   #Psychosis -random VPA level was 50, increase depakote to 500 mg TID, recheck VPA level -increase Seroquel to 300 mg nightly -Invega sustenna injection was last given on 3/15   #Insomnia -Trazodone 100 mg nightly PRN  #DM -ADA diet, BG monitoring -Metformin 500 mg in AM  #Dislipidemia -Zocor 20 mg daily  #HTN -Metoprolol 37.5 mg BID -Cozaar 50 mg daily  #Hypothyroidism -continue Synthroid 25 ug daily  #Metabolic syndrome monitoring -lipid panel, TSH and HgbA1C are pending -EKG,  #Discharge planning -discharge to home with the sister -follow up with [facility] ACT team  I certify that inpatient services furnished can reasonably be expected to improve the patient's condition.   Orson Slick, MD 11/05/2017, 9:30 AM

## 2017-11-05 NOTE — BHH Suicide Risk Assessment (Signed)
[place] INPATIENT:  Family/Significant Other Suicide Prevention Education  Suicide Prevention Education:  Education Completed;  Pt's sister, Devoria Glassing, at [phone] has been identified by the patient as the family member/significant other with whom the patient will be residing, and identified as the person(s) who will aid the patient in the event of a mental health crisis (suicidal ideations/suicide attempt).  With written consent from the patient, the family member/significant other has been provided the following suicide prevention education, prior to the and/or following the discharge of the patient.  The suicide prevention education provided includes the following:  Suicide risk factors  Suicide prevention and interventions  National Suicide Hotline telephone number  [facility] assessment telephone number  [facility] Emergency Assistance [place] and/or Residential Mobile Crisis Unit telephone number  Request made of family/significant other to:  Remove weapons (e.g., guns, rifles, knives), all items previously/currently identified as safety concern.    Remove drugs/medications (over-the-counter, prescriptions, illicit drugs), all items previously/currently identified as a safety concern.  The family member/significant other verbalizes understanding of the suicide prevention education information provided.  The family member/significant other agrees to remove the items of safety concern listed above.  Pt's sister added, "She's been laughing to herself. Somebody called her up and told her she won a lot of money. They said she had to give them $4000, and she would be rich. I think that's what brought it on. She gave them the money, and then they wanted more money. They wanted the money a special way, and she took it down to the post office, and they told her that it was a scam down at the post office. The police came and took all her information, and said  she wouldn't get her money back. She got upset about that too. Then, she wants to go back up to [place] with our brother. We don't know where nothing's at and we're paranoid. We've been down here for six years, but my son moved away about a year ago. Nobody wants nothing to do with [place] and we don't know nobody. I called her ACT Team and they gave me a hard time. I know what Keiandra is like--I don't want her to snap. She's been like this since she was 15."  Midge Minium  11/05/2017, 9:29 AM

## 2017-11-05 NOTE — Progress Notes (Signed)
[facility] MD Progress Note  11/06/2017 3:23 PM Gina [name]  MRN:  [id number]  Subjective:   [name] met with treatment team today. Sleep is stillinterrupted, she still has her visions of satan poking her with a pitchfork and is confused. She talk to much, thoughts are racing.She feels she is on too much medications and vision is blurry. She worries that her she has not been give Metformin or Synthroid, not true.  Principal Problem: Bipolar I disorder, current or most recent episode manic, with psychotic features ([place]) Diagnosis:   Patient Active Problem List   Diagnosis Date Noted  . Bipolar I disorder, current or most recent episode manic, with psychotic features ([place]) [F31.2] 11/04/2017    Priority: High  . Gallbladder polyp [K82.4] 10/21/2017  . Left shoulder pain [M25.512] 10/21/2017  . Benign neoplasm of ascending colon [D12.2]   . Bipolar affective disorder, current episode hypomanic ([place]) [F31.0] 04/08/2017  . Low back pain radiating to left lower extremity [M54.5, M79.605] 02/26/2017  . Acquired hypothyroidism [E03.9] 09/10/2016  . Thrombocytopenia ([place]) [D69.6] 06/15/2016  . Medicare annual wellness visit, subsequent [Z00.00] 06/04/2016  . Advanced care planning/counseling discussion [Z71.89] 06/04/2016  . External hemorrhoid [K64.4] 04/02/2016  . Slow transit constipation [K59.01] 01/04/2016  . Bipolar I disorder, most recent episode (or current) manic ([place]) [F31.10] 12/03/2015  . NAFLD (nonalcoholic fatty liver disease) [K76.0] 03/28/2015  . Gallbladder calculus without cholecystitis [K80.20] 03/28/2015  . Gout [M10.9] 03/27/2015  . HCV antibody positive [R76.8] 03/27/2015  . History of bladder cancer [Z85.51] 03/27/2015  . History of bundle branch block [Z86.79] 03/27/2015  . Genetic testing [Z13.79] 02/28/2015  . Loss of memory [R41.3] 09/04/2014  . Controlled type 2 diabetes mellitus with diabetic nephropathy ([place]) [E11.21] 06/07/2014  . Hyperlipidemia [E78.5]    . Anemia in CKD (chronic kidney disease) [N18.9, D63.1] 01/26/2013  . Personal history of colonic adenomas and colon cancer [Z86.010] 11/11/2012  . Osteoarthritis of knee [M17.10] 10/02/2012  . OSA on CPAP [G47.33, Z99.89]   . CKD (chronic kidney disease) stage 3, GFR 30-59 ml/min (HCC) [N18.3]   . Severe obesity (BMI 35.0-39.9) with comorbidity ([place]) [E66.01]   . HTN (hypertension), benign [I10] 04/07/2012  . Hx of colon cancer, stage I [Z85.038] 08/05/2011   Total Time spent with patient: 20 minutes  Past Psychiatric History: bipolar disrder  Past Medical History:  Past Medical History:  Diagnosis Date  . Anemia in chronic kidney disease   . Bipolar depression [facility])    sees psychiatrist - psych admission 03/2015  . Bladder cancer ([place]) 1990's  . Blood transfusion without reported diagnosis 2009  . Cataract    left  . CKD (chronic kidney disease) stage 3, GFR 30-59 ml/min (HCC)   . Colon cancer ([place]) 1990's  . DJD (degenerative joint disease), lumbar    chronic lower back pain  . Enterocutaneous fistula 04/07/2012   completed PT 06/2012 ([facility])  . Family history of breast cancer   . Family history of colon cancer   . Family history of stomach cancer   . GERD (gastroesophageal reflux disease)   . History of bladder cancer 1997  . History of colon cancer    s/p surgery  . History of uterine cancer    s/p hysterectomy  . Hyperlipidemia   . Hypertension   . IDA (iron deficiency anemia) 01/2013   thought due to h/o polyps  . Obesity (BMI 30-39.9)   . OSA on CPAP    6cm H2O  . Personal  history of colonic adenomas and colon cancer 11/11/2012  . Positive hepatitis C antibody test 09/2014   but negative confirmatory testing  . RBBB   . Uncontrolled type 2 diabetes mellitus with nephropathy ([place]) 06/07/2014   completed DSME 02/2016    Past Surgical History:  Procedure Laterality Date  . BREAST BIOPSY Right 01/2014   fibroadenoma  . COLONOSCOPY  07/2009  . COLONOSCOPY   11/2012   2 tubular adenomas, mild diverticulosis, pending genetic testing for Lynch syndrome [name]) rpt 2 yrs  . COLONOSCOPY  02/2015   TA, diverticulosis, rpt 2 yrs [name])  . COLONOSCOPY WITH PROPOFOL N/A 06/09/2017   TAx1, rpt 2 yrs [name], [name], MD)  . DEXA  12/2009   WNL  . DOBUTAMINE STRESS ECHO  12/2009   no evidence of ischemia  . HERNIA REPAIR  02/04/12  . i & d abdominal wound  02/19/12  . LEFT OOPHORECTOMY  2005  . PARTIAL COLECTOMY  about 2008   for colon cancer  . PARTIAL HYSTERECTOMY  1981   uterine cancer, R ovary remains  . Reexploration of abdominal wound and Allograft placemet  02/26/12  . Removal of infected mesh and abdominal wound vac placement  02/24/12  . sleep study  02/2014   OSA - AHI 55, nadir 81% [name])   Family History:  Family History  Problem Relation Age of Onset  . Colon cancer Mother 21  . Stomach cancer Mother        dx in her 65s?  . CAD Father        MI; deceased 69  . Mental illness Sister        anxiety/depression  . Thyroid disease Sister   . Breast cancer Maternal Grandmother        age at dx unknown  . Diabetes Brother   . Diabetes Brother   . Diabetes Other        aunts and uncles both sides  . Arthritis Other        strong fmhx  . Colon cancer Other 25       maternal half-sister; deceased   Family Psychiatric  History: none reported Social History:  Social History   Substance and Sexual Activity  Alcohol Use No  . Alcohol/week: 0.0 oz     Social History   Substance and Sexual Activity  Drug Use No    Social History   Socioeconomic History  . Marital status: Single    Spouse name: Not on file  . Number of children: 0  . Years of education: Not on file  . Highest education level: Not on file  Occupational History  . Not on file  Social Needs  . Financial resource strain: Not on file  . Food insecurity:    Worry: Not on file    Inability: Not on file  . Transportation needs:    Medical: Not on file     Non-medical: Not on file  Tobacco Use  . Smoking status: Never Smoker  . Smokeless tobacco: Never Used  Substance and Sexual Activity  . Alcohol use: No    Alcohol/week: 0.0 oz  . Drug use: No  . Sexual activity: Never  Lifestyle  . Physical activity:    Days per week: Not on file    Minutes per session: Not on file  . Stress: Not on file  Relationships  . Social connections:    Talks on phone: Not on file    Gets together: Not on  file    Attends religious service: Not on file    Active member of club or organization: Not on file    Attends meetings of clubs or organizations: Not on file    Relationship status: Not on file  Other Topics Concern  . Not on file  Social History Narrative   Lives with sister, no pets   Occupation: disabled, for bipolar and arthritis   Edu: GED   Activity: take walks   Diet: good water, vegetables daily   Religion: [place], Dr. Jimmye Norman (ph [phone])   Additional Social History:    Pain Medications: see PTA Prescriptions: see PTA Over the Counter: see PTA History of alcohol / drug use?: No history of alcohol / drug abuse Longest period of sobriety (when/how long): (N/A) Negative Consequences of Use: (N/A) Withdrawal Symptoms: (N/A)                    Sleep: Poor  Appetite:  Fair  Current Medications: Current Facility-Administered Medications  Medication Dose Route Frequency Provider Last Rate Last Dose  . acetaminophen (TYLENOL) tablet 650 mg  650 mg Oral Q6H PRN Clapacs, Madie Reno, MD   650 mg at 11/05/17 1644  . alum & mag hydroxide-simeth (MAALOX/MYLANTA) 200-200-20 MG/5ML suspension 30 mL  30 mL Oral Q4H PRN Clapacs, John T, MD      . divalproex (DEPAKOTE) DR tablet 500 mg  500 mg Oral Q8H Danzel Marszalek B, MD   500 mg at 11/06/17 1410  . hydrOXYzine (ATARAX/VISTARIL) tablet 50 mg  50 mg Oral TID PRN Clapacs, Madie Reno, MD   50 mg at 11/04/17 2233  . levothyroxine (SYNTHROID,  LEVOTHROID) tablet 25 mcg  25 mcg Oral QAC breakfast Croix Presley B, MD   25 mcg at 11/06/17 0646  . losartan (COZAAR) tablet 50 mg  50 mg Oral Daily Clapacs, Madie Reno, MD   50 mg at 11/06/17 0841  . magnesium hydroxide (MILK OF MAGNESIA) suspension 30 mL  30 mL Oral Daily PRN Clapacs, John T, MD      . metFORMIN (GLUCOPHAGE) tablet 500 mg  500 mg Oral Q breakfast Clapacs, Madie Reno, MD   500 mg at 11/06/17 0841  . metoprolol tartrate (LOPRESSOR) tablet 37.5 mg  37.5 mg Oral BID Clapacs, Madie Reno, MD   37.5 mg at 11/06/17 0841  . [START ON 11/13/2017] paliperidone (INVEGA SUSTENNA) injection 234 mg  234 mg Intramuscular Q28 days Kalan Rinn B, MD      . QUEtiapine (SEROQUEL) tablet 300 mg  300 mg Oral QHS Clapacs, John T, MD   300 mg at 11/05/17 2201  . simvastatin (ZOCOR) tablet 20 mg  20 mg Oral q1800 Clapacs, Madie Reno, MD   20 mg at 11/05/17 1644  . traZODone (DESYREL) tablet 100 mg  100 mg Oral QHS PRN Clapacs, Madie Reno, MD   100 mg at 11/05/17 2200    Lab Results:  Results for orders placed or performed during the hospital encounter of 11/04/17 (from the past 48 hour(s))  Glucose, capillary     Status: Abnormal   Collection Time: 11/04/17  6:38 PM  Result Value Ref Range   Glucose-Capillary 102 (H) 65 - 99 mg/dL  Valproic acid level     Status: None   Collection Time: 11/05/17 12:53 AM  Result Value Ref Range   Valproic Acid Lvl 50 50.0 - 100.0 ug/mL    Comment: Performed  at [facility], [address] [place], [place] [address]  Glucose, capillary     Status: Abnormal   Collection Time: 11/05/17  7:05 AM  Result Value Ref Range   Glucose-Capillary 132 (H) 65 - 99 mg/dL   Comment 1 Notify RN   Glucose, capillary     Status: Abnormal   Collection Time: 11/06/17  6:50 AM  Result Value Ref Range   Glucose-Capillary 112 (H) 65 - 99 mg/dL    Blood Alcohol level:  Lab Results  Component Value Date   ETH <10 11/03/2017   ETH <5 60/63/0160    Metabolic Disorder  Labs: Lab Results  Component Value Date   HGBA1C 6.3 09/30/2017   MPG 114 04/14/2012   Lab Results  Component Value Date   PROLACTIN 33.6 (H) 12/04/2015   Lab Results  Component Value Date   CHOL 161 09/30/2017   TRIG 176.0 (H) 09/30/2017   HDL 44.50 09/30/2017   CHOLHDL 4 09/30/2017   VLDL 35.2 09/30/2017   LDLCALC 81 09/30/2017   LDLCALC 52 12/04/2015    Physical Findings: AIMS: Facial and Oral Movements Muscles of Facial Expression: None, normal Lips and Perioral Area: None, normal Jaw: None, normal Tongue: None, normal,Extremity Movements Upper (arms, wrists, hands, fingers): None, normal Lower (legs, knees, ankles, toes): None, normal, Trunk Movements Neck, shoulders, hips: None, normal, Overall Severity Severity of abnormal movements (highest score from questions above): None, normal Incapacitation due to abnormal movements: None, normal Patient's awareness of abnormal movements (rate only patient's report): No Awareness, Dental Status Current problems with teeth and/or dentures?: Yes Does patient usually wear dentures?: Yes  CIWA:  CIWA-Ar Total: 0 COWS:  COWS Total Score: 0  Musculoskeletal: Strength & Muscle Tone: decreased Gait & Station: unsteady Patient leans: N/A  Psychiatric Specialty Exam: Physical Exam  Nursing note and vitals reviewed. Psychiatric: Her speech is normal. Her affect is labile. She is actively hallucinating. Thought content is delusional. Cognition and memory are impaired. She expresses impulsivity.    Review of Systems  Musculoskeletal: Positive for joint pain.  Neurological: Negative.   Psychiatric/Behavioral: Positive for hallucinations and memory loss. The patient has insomnia.   All other systems reviewed and are negative.   Blood pressure 118/64, pulse (!) 104, temperature 98.8 F (37.1 C), temperature source Oral, resp. rate 16, height _0  (1.575 m), weight 89.8 kg (198 lb), SpO2 100 %.Body mass index is 36.21 kg/m.   General Appearance: Casual  Eye Contact:  Good  Speech:  Pressured  Volume:  Increased  Mood:  Euphoric  Affect:  Congruent  Thought Process:  Disorganized and Descriptions of Associations: Tangential  Orientation:  Full (Time, Place, and Person)  Thought Content:  Hallucinations: Visual  Suicidal Thoughts:  No  Homicidal Thoughts:  No  Memory:  Immediate;   Poor Recent;   Poor Remote;   Poor  Judgement:  Impaired  Insight:  Shallow  Psychomotor Activity:  Normal  Concentration:  Concentration: Poor and Attention Span: Poor  Recall:  Poor  Fund of Knowledge:  Fair  Language:  Fair  Akathisia:  No  Handed:  Right  AIMS (if indicated):     Assets:  Communication Skills Desire for Improvement Financial Resources/Insurance Housing Physical Health Resilience Social Support  ADL's:  Intact  Cognition:  WNL  Sleep:  Number of Hours: 7.45     Treatment Plan Summary: Daily contact with patient to assess and evaluate symptoms and progress in treatment and Medication management   Assesment.  [name] is a 67 year old female with a history of bipolar disorder admitted in a manic, psychotic episode in the context of medication noncompliance. She is stil disorganized, hallucinating, grandiose, hyperverbal and hypereligius. Sleep is still interrupted. She complains of bilateral knee pain and walks with difficulty needs PT. Her BP is low, pulse slightly elevated. She was diagnosed with hypothyroidism in February needs thyroid function rechecked. Has sleep apnea.  #Psychosis, still psychotic, grandiose, hyperverbal, hypereligius, VH -continue Depakote 500 mg TID, level on admission 50 likely noncomplinace -continue Seroquel 300 mg nightly -continue Invega sustenna 234 mg injections on 4/12 -EKG, reviewed NSR QTc 497 -continue lamictal 50 mg BID  #Insomnia, sleep interrupted -continue Trazodone 100 mg nightly  #DM, stable -morning BS reviewed -continue Metformin 500 mg daily   -continue ADA diet and BS monitoring -HgbA1C on 2/27 was 6.3  #HTN, BP low -lower Metoprolol to 25 mg BID -Cozaar 50 mg daily  #Dislipidemia, lipid panel on 2/27 shows elevated TG -continue Zocor 20 mg daily  #Hypothyroidism, elevated TSH and low T4 on 2/27, started on Synthroid -continue Synthroid 25 ug daily -recheck thyroid function tests  #Metabolic syndrome monitoring -lipid panel, TSH and HgbA1C are pending -EKG,  #Sleep apnea -CPAP  #Knee pain -PT  #Admission status -voluntary admisssion  #Discharge planning -discharge to home with the sister -follow up with [facility] ACT team    Orson Slick, MD 11/06/2017, 3:23 PM

## 2017-11-05 NOTE — Progress Notes (Signed)
Patient is alert and oriented x 4, denies pain or discomfort, denies SI/HI/AVH, her thoughts are disorganized, speech is tangential but easily redirectable. Patient is interacting appropriately with peers and staff no distress noted. Patient was encouraged to attend evening wrap up group.  Patient attends evening group and interacts appropriately with peers and staff. Patient is complaint with medication, no distress noted

## 2017-11-05 NOTE — H&P (Signed)
Psychiatric Admission Assessment Adult  Patient Identification: Gina [name] MRN:  [id number] Date of Evaluation:  11/05/2017 Chief Complaint:  Bipolar Disorder Principal Diagnosis: Bipolar I disorder, current or most recent episode manic, with psychotic features [facility]) Diagnosis:   Patient Active Problem List   Diagnosis Date Noted  . Bipolar I disorder, current or most recent episode manic, with psychotic features ([place]) [F31.2] 11/04/2017    Priority: High  . Gallbladder polyp [K82.4] 10/21/2017  . Left shoulder pain [M25.512] 10/21/2017  . Benign neoplasm of ascending colon [D12.2]   . Bipolar affective disorder, current episode hypomanic ([place]) [F31.0] 04/08/2017  . Low back pain radiating to left lower extremity [M54.5, M79.605] 02/26/2017  . Acquired hypothyroidism [E03.9] 09/10/2016  . Thrombocytopenia ([place]) [D69.6] 06/15/2016  . Medicare annual wellness visit, subsequent [Z00.00] 06/04/2016  . Advanced care planning/counseling discussion [Z71.89] 06/04/2016  . External hemorrhoid [K64.4] 04/02/2016  . Slow transit constipation [K59.01] 01/04/2016  . Bipolar I disorder, most recent episode (or current) manic ([place]) [F31.10] 12/03/2015  . NAFLD (nonalcoholic fatty liver disease) [K76.0] 03/28/2015  . Gallbladder calculus without cholecystitis [K80.20] 03/28/2015  . Gout [M10.9] 03/27/2015  . HCV antibody positive [R76.8] 03/27/2015  . History of bladder cancer [Z85.51] 03/27/2015  . History of bundle branch block [Z86.79] 03/27/2015  . Genetic testing [Z13.79] 02/28/2015  . Loss of memory [R41.3] 09/04/2014  . Controlled type 2 diabetes mellitus with diabetic nephropathy ([place]) [E11.21] 06/07/2014  . Hyperlipidemia [E78.5]   . Anemia in CKD (chronic kidney disease) [N18.9, D63.1] 01/26/2013  . Personal history of colonic adenomas and colon cancer [Z86.010] 11/11/2012  . Osteoarthritis of knee [M17.10] 10/02/2012  . OSA on CPAP [G47.33, Z99.89]   . CKD (chronic kidney  disease) stage 3, GFR 30-59 ml/min (HCC) [N18.3]   . Severe obesity (BMI 35.0-39.9) with comorbidity ([place]) [E66.01]   . HTN (hypertension), benign [I10] 04/07/2012  . Hx of colon cancer, stage I [Z85.038] 08/05/2011   History of Present Illness:   Identifying data. [name] is a 67 year old female with a history of bipolar illness.  Chief complaint. "I could not sleep."  History of present illness. Information was obtained from the patient and the chart. The patient came to the ER complaining of insomnia and racing thoughts of several days duration. She has been praying a lot to fight satan that is coming at her with his pitchfrok. She denies auditory hallucinations at present but did experience voices in the past. She also has been giving people "grace" through their eyes. She admits, that she has not been entirely complaint with medications prescribed by her psychiatrist from ACT team and missed several doses recently. She is unsbale to name any of her medications. She did not remember she is taking [place] sustenna shots. She denies depression, anxiety, suicidal or homicidal ideation. She did not spend any money. She does not use drugs or alcohol.   Past psychiatric history. Long history of bipolar illness with multiple psychiatric hospitalizations and medications trials. She denies suicide attempts but reports that at times she came close "when satan took his pitchfork to her". 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  Family psychiatric history. Her sister does not read or write.  Social history. She lives with her sister and is disabled from mental illness. Her son lives out of state. She use to work in a [place] but her father decided it was too stressful.  Total Time spent with patient: 1 hour  Is the patient at risk  to self? No.  Has the patient been a risk to self in the past 6 months? No.  Has the patient been a risk to self within the distant past? No.  Is the patient a  risk to others? No.  Has the patient been a risk to others in the past 6 months? No.  Has the patient been a risk to others within the distant past? No.   Prior Inpatient Therapy:   Prior Outpatient Therapy:    Alcohol Screening: 1. How often do you have a drink containing alcohol?: Never 2. How many drinks containing alcohol do you have on a typical day when you are drinking?: 1 or 2 3. How often do you have six or more drinks on one occasion?: Never AUDIT-C Score: 0 4. How often during the last year have you found that you were not able to stop drinking once you had started?: Never 5. How often during the last year have you failed to do what was normally expected from you becasue of drinking?: Never 6. How often during the last year have you needed a first drink in the morning to get yourself going after a heavy drinking session?: Never 7. How often during the last year have you had a feeling of guilt of remorse after drinking?: Never 8. How often during the last year have you been unable to remember what happened the night before because you had been drinking?: Never 9. Have you or someone else been injured as a result of your drinking?: No 10. Has a relative or friend or a doctor or another health worker been concerned about your drinking or suggested you cut down?: No Alcohol Use Disorder Identification Test Final Score (AUDIT): 0 Intervention/Follow-up: AUDIT Score <7 follow-up not indicated Substance Abuse History in the last 12 months:  No. Consequences of Substance Abuse: NA Previous Psychotropic Medications: Yes  Psychological Evaluations: No  Past Medical History:  Past Medical History:  Diagnosis Date  . Anemia in chronic kidney disease   . Bipolar depression [facility])    sees psychiatrist - psych admission 03/2015  . Bladder cancer ([place]) 1990's  . Blood transfusion without reported diagnosis 2009  . Cataract    left  . CKD (chronic kidney disease) stage 3, GFR 30-59 ml/min  (HCC)   . Colon cancer ([place]) 1990's  . DJD (degenerative joint disease), lumbar    chronic lower back pain  . Enterocutaneous fistula 04/07/2012   completed PT 06/2012 ([facility])  . Family history of breast cancer   . Family history of colon cancer   . Family history of stomach cancer   . GERD (gastroesophageal reflux disease)   . History of bladder cancer 1997  . History of colon cancer    s/p surgery  . History of uterine cancer    s/p hysterectomy  . Hyperlipidemia   . Hypertension   . IDA (iron deficiency anemia) 01/2013   thought due to h/o polyps  . Obesity (BMI 30-39.9)   . OSA on CPAP    6cm H2O  . Personal history of colonic adenomas and colon cancer 11/11/2012  . Positive hepatitis C antibody test 09/2014   but negative confirmatory testing  . RBBB   . Uncontrolled type 2 diabetes mellitus with nephropathy ([place]) 06/07/2014   completed DSME 02/2016    Past Surgical History:  Procedure Laterality Date  . BREAST BIOPSY Right 01/2014   fibroadenoma  . COLONOSCOPY  07/2009  . COLONOSCOPY  11/2012   2  tubular adenomas, mild diverticulosis, pending genetic testing for Lynch syndrome [name]) rpt 2 yrs  . COLONOSCOPY  02/2015   TA, diverticulosis, rpt 2 yrs [name])  . COLONOSCOPY WITH PROPOFOL N/A 06/09/2017   TAx1, rpt 2 yrs [name], [name], MD)  . DEXA  12/2009   WNL  . DOBUTAMINE STRESS ECHO  12/2009   no evidence of ischemia  . HERNIA REPAIR  02/04/12  . i & d abdominal wound  02/19/12  . LEFT OOPHORECTOMY  2005  . PARTIAL COLECTOMY  about 2008   for colon cancer  . PARTIAL HYSTERECTOMY  1981   uterine cancer, R ovary remains  . Reexploration of abdominal wound and Allograft placemet  02/26/12  . Removal of infected mesh and abdominal wound vac placement  02/24/12  . sleep study  02/2014   OSA - AHI 55, nadir 81% [name])   Family History:  Family History  Problem Relation Age of Onset  . Colon cancer Mother 29  . Stomach cancer Mother        dx in her 3s?  . CAD  Father        MI; deceased 33  . Mental illness Sister        anxiety/depression  . Thyroid disease Sister   . Breast cancer Maternal Grandmother        age at dx unknown  . Diabetes Brother   . Diabetes Brother   . Diabetes Other        aunts and uncles both sides  . Arthritis Other        strong fmhx  . Colon cancer Other 69       maternal half-sister; deceased    Tobacco Screening: Have you used any form of tobacco in the last 30 days? (Cigarettes, Smokeless Tobacco, Cigars, and/or Pipes): No Social History:  Social History   Substance and Sexual Activity  Alcohol Use No  . Alcohol/week: 0.0 oz     Social History   Substance and Sexual Activity  Drug Use No    Additional Social History: Marital status: Single Are you sexually active?: No What is your sexual orientation?: "I'm just a regular girl." Pt was unable to elaborate on this response.  Has your sexual activity been affected by drugs, alcohol, medication, or emotional stress?: Pt denies.  Does patient have children?: No    Pain Medications: see PTA Prescriptions: see PTA Over the Counter: see PTA History of alcohol / drug use?: No history of alcohol / drug abuse Longest period of sobriety (when/how long): (N/A) Negative Consequences of Use: (N/A) Withdrawal Symptoms: (N/A)                    Allergies:   Allergies  Allergen Reactions  . Risperidone And Related Other (See Comments)    Reaction:  Altered mental status   . Penicillins Other (See Comments)    Pt states that this med knocks her out.  Has patient had a PCN reaction causing immediate rash, facial/tongue/throat swelling, SOB or lightheadedness with hypotension Unsure  Has patient had a PCN reaction causing severe rash involving mucus membranes or skin necrosis Unsure  Has patient had a PCN reaction that required hospitalization Unsure  Has patient had a PCN reaction occurring within the last 10 years Unsure  If all of the above  answers are "NO", then may proceed with Cephalosporin use.  Clementeen Hoof [Iodinated Diagnostic Agents] Rash  . Tetanus Toxoids Rash       .  Zetia [Ezetimibe] Rash   Lab Results:  Results for orders placed or performed during the hospital encounter of 11/04/17 (from the past 48 hour(s))  Glucose, capillary     Status: Abnormal   Collection Time: 11/04/17  6:38 PM  Result Value Ref Range   Glucose-Capillary 102 (H) 65 - 99 mg/dL  Valproic acid level     Status: None   Collection Time: 11/05/17 12:53 AM  Result Value Ref Range   Valproic Acid Lvl 50 50.0 - 100.0 ug/mL    Comment: Performed at [facility], [address] [place], [place] [address]  Glucose, capillary     Status: Abnormal   Collection Time: 11/05/17  7:05 AM  Result Value Ref Range   Glucose-Capillary 132 (H) 65 - 99 mg/dL   Comment 1 Notify RN     Blood Alcohol level:  Lab Results  Component Value Date   ETH <10 11/03/2017   ETH <5 51/09/5850    Metabolic Disorder Labs:  Lab Results  Component Value Date   HGBA1C 6.3 09/30/2017   MPG 114 04/14/2012   Lab Results  Component Value Date   PROLACTIN 33.6 (H) 12/04/2015   Lab Results  Component Value Date   CHOL 161 09/30/2017   TRIG 176.0 (H) 09/30/2017   HDL 44.50 09/30/2017   CHOLHDL 4 09/30/2017   VLDL 35.2 09/30/2017   LDLCALC 81 09/30/2017   LDLCALC 52 12/04/2015    Current Medications: Current Facility-Administered Medications  Medication Dose Route Frequency Provider Last Rate Last Dose  . acetaminophen (TYLENOL) tablet 650 mg  650 mg Oral Q6H PRN Clapacs, Madie [place], MD   650 mg at 11/04/17 2233  . alum & mag hydroxide-simeth (MAALOX/MYLANTA) 200-200-20 MG/5ML suspension 30 mL  30 mL Oral Q4H PRN Clapacs, John T, MD      . divalproex (DEPAKOTE) DR tablet 500 mg  500 mg Oral Q8H Ege Muckey B, MD   500 mg at 11/05/17 0613  . hydrOXYzine (ATARAX/VISTARIL) tablet 50 mg  50 mg Oral TID PRN Clapacs, Madie Reno, MD   50 mg at 11/04/17  2233  . levothyroxine (SYNTHROID, LEVOTHROID) tablet 25 mcg  25 mcg Oral QAC breakfast Jeanet Lupe B, MD   25 mcg at 11/05/17 [phone]  . losartan (COZAAR) tablet 50 mg  50 mg Oral Daily Clapacs, Madie Reno, MD   50 mg at 11/05/17 0907  . magnesium hydroxide (MILK OF MAGNESIA) suspension 30 mL  30 mL Oral Daily PRN Clapacs, John T, MD      . metFORMIN (GLUCOPHAGE) tablet 500 mg  500 mg Oral Q breakfast Clapacs, Madie Reno, MD   500 mg at 11/05/17 0908  . metoprolol tartrate (LOPRESSOR) tablet 37.5 mg  37.5 mg Oral BID Clapacs, Madie Reno, MD   37.5 mg at 11/05/17 0908  . QUEtiapine (SEROQUEL) tablet 300 mg  300 mg Oral QHS Clapacs, John T, MD   300 mg at 11/05/17 0002  . simvastatin (ZOCOR) tablet 20 mg  20 mg Oral q1800 Clapacs, John T, MD      . traZODone (DESYREL) tablet 100 mg  100 mg Oral QHS PRN Clapacs, Madie Reno, MD   100 mg at 11/04/17 2233   PTA Medications: Medications Prior to Admission  Medication Sig Dispense Refill Last Dose  . ACCU-CHEK AVIVA PLUS test strip 1 each by Other route daily. E11.21 100 each 3 Taking  . divalproex (DEPAKOTE) 500 MG DR tablet Take 1 tablet (500 mg total) by mouth 2 (  two) times daily with a meal. 60 tablet 3 Taking  . lamoTRIgine (LAMICTAL) 25 MG tablet Take 25 mg by mouth daily.   Taking  . levothyroxine (SYNTHROID, LEVOTHROID) 50 MCG tablet Take 1 tablet (50 mcg total) by mouth daily before breakfast. 30 tablet 11   . losartan (COZAAR) 50 MG tablet Take 1 tablet (50 mg total) by mouth daily. 30 tablet 11 Taking  . MELATONIN PO Take 1 tablet by mouth at bedtime.   Taking  . metFORMIN (GLUCOPHAGE) 500 MG tablet Take 1 tablet (500 mg total) by mouth daily with breakfast. 30 tablet 6 Taking  . metoprolol tartrate (LOPRESSOR) 25 MG tablet TAKE 1 & 1/2 TABLET BY MOUTH TWICE DAILY WITH FOOD 84 tablet 5 Taking  . nystatin (MYCOSTATIN/NYSTOP) powder Apply topically 3 (three) times daily. 60 g 1 Taking  . paliperidone (INVEGA SUSTENNA) 234 MG/1.5ML SUSP injection Inject  234 mg into the muscle every 30 (thirty) days.   Taking  . polyethylene glycol powder (GLYCOLAX/MIRALAX) powder TAKE 17 GRAMS BY MOUTH TWICE DAILY AS NEEDED FOR MODERATE CONSTIPATION. 527 g 3 Taking  . QUEtiapine (SEROQUEL) 200 MG tablet Take 200 mg by mouth at bedtime.   Taking  . simvastatin (ZOCOR) 20 MG tablet Take 1 tablet (20 mg total) by mouth at bedtime. 30 tablet 11 Taking    Musculoskeletal: Strength & Muscle Tone: within normal limits Gait & Station: normal Patient leans: N/A  Psychiatric Specialty Exam: I reviewed physical exam performed in the ER and agree with the findings. Physical Exam  Nursing note and vitals reviewed. Psychiatric: Her affect is labile and inappropriate. Her speech is rapid and/or pressured. She is agitated and actively hallucinating. Thought content is paranoid and delusional. Cognition and memory are impaired. She expresses impulsivity.    Review of Systems  Neurological: Negative.   Psychiatric/Behavioral: Positive for hallucinations. The patient has insomnia.   All other systems reviewed and are negative.   Blood pressure 109/72, pulse (!) 124, temperature 98 F (36.7 C), temperature source Oral, resp. rate 18, height 5' 2"  (1.575 m), weight 89.8 kg (198 lb), SpO2 100 %.Body mass index is 36.21 kg/m.  See SRA                                                  Sleep:  Number of Hours: 7.3    Treatment Plan Summary: Daily contact with patient to assess and evaluate symptoms and progress in treatment and Medication management   Ms. Stangler is a 67 year old female with a history of bipolar disorder admitted in a manic episode in the context of medication noncompliance.   #Psychosis -random VPA level was 50, increase depakote to 500 mg TID, recheck VPA level -increase Seroquel to 300 mg nightly -Invega sustenna injection was last given on 3/15   #Insomnia -Trazodone 100 mg nightly PRN  #DM -ADA diet, BG  monitoring -Metformin 500 mg in AM  #Dislipidemia -Zocor 20 mg daily  #HTN -Metoprolol 37.5 mg BID -Cozaar 50 mg daily  #Hypothyroidism -continue Synthroid 25 ug daily  #Metabolic syndrome monitoring -lipid panel, TSH and HgbA1C are pending -EKG,  #Discharge planning -discharge to home with the sister -follow up with [facility] ACT team   Observation Level/Precautions:  15 minute checks  Laboratory:  CBC Chemistry Profile UDS UA  Psychotherapy:    Medications:    Consultations:  Discharge Concerns:    Estimated LOS:  Other:     Physician Treatment Plan for Primary Diagnosis: Bipolar I disorder, current or most recent episode manic, with psychotic features ([place]) Long Term Goal(s): Improvement in symptoms so as ready for discharge  Short Term Goals: Ability to identify changes in lifestyle to reduce recurrence of condition will improve, Ability to verbalize feelings will improve, Ability to disclose and discuss suicidal ideas, Ability to demonstrate self-control will improve, Ability to identify and develop effective coping behaviors will improve, Ability to maintain clinical measurements within normal limits will improve, Compliance with prescribed medications will improve and Ability to identify triggers associated with substance abuse/mental health issues will improve  Physician Treatment Plan for Secondary Diagnosis: Principal Problem:   Bipolar I disorder, current or most recent episode manic, with psychotic features ([place]) Active Problems:   HTN (hypertension), benign   OSA on CPAP   Hyperlipidemia   Controlled type 2 diabetes mellitus with diabetic nephropathy ([place])   Acquired hypothyroidism  Long Term Goal(s): NA  Short Term Goals: NA  I certify that inpatient services furnished can reasonably be expected to improve the patient's condition.    Orson Slick, MD 4/4/201910:26 AM

## 2017-11-05 NOTE — [facility] Group Notes (Signed)
[facility] Group Notes:  (Nursing/MHT/Case Management/Adjunct)  Date:  11/05/2017  Time:  9:35 PM  Type of Therapy:  Group Therapy  Participation Level:  Active  Participation Quality:  Appropriate  Affect:  Appropriate  Cognitive:  Alert  Insight:  Good  Engagement in Group:  Engaged  Modes of Intervention:  Support  Summary of Progress/Problems:  Gina [name] 11/05/2017, 9:35 PM

## 2017-11-05 NOTE — Progress Notes (Signed)
Patient ID: Gina [name], female   DOB: [birth year], 67 y.o.   MRN: [id number]  CSW contacted [facility] ACT Team Lead, Gina [name] at [phone], to obtain information regarding pt's Invega injection. Gina [name] confirmed that pt is still on the Invega injection, and stated the last injection was given on 10/15/17 and it is due on 11/12/17. CSW asked Gina [name] to fax an updated medication list to [phone]. Gina [name] expressed agreement. CSW will continue to coordinate with pt's ACT Team as needed for further updates and discharge planning.   Gina [name], MSW, LCSW Clinical Social Worker 11/05/2017 9:59 AM

## 2017-11-05 NOTE — Progress Notes (Signed)
Recreation Therapy Notes  INPATIENT RECREATION THERAPY ASSESSMENT  Patient Details Name: Gina [name] MRN: [id number] DOB: 05-26-1951 Today's Date: 11/05/2017       Information Obtained From: Patient  Able to Participate in Assessment/Interview: Yes  Patient Presentation: Responsive  Reason for Admission (Per Patient): Active Symptoms(could not sleep had a break down)  Patient Stressors: Other (Comment)(sleep)  Coping Skills:   Prayer, Read  Leisure Interests (2+):  Games - Counsellor)  Frequency of Recreation/Participation: Weekly  Awareness of Community Resources:  Yes  Community Resources:     Current Use: No  If no, Barriers?: Transportation  Expressed Interest in [place]: Yes  County of Residence:  [place]urance under[place]  Patient Main Form of Transportation: Musician  Patient Strengths:  My sense of humor  Patient Identified Areas of Improvement:  Get my mind situated  Patient Goal for Hospitalization:  To get sleep   Current SI (including self-harm):  No  Current HI:  No  Current AVH: No  Staff Intervention Plan: Collaborate with Interdisciplinary Treatment Team, Group Attendance  Consent to Intern Participation: N/A  [name] 11/05/2017, 2:04 PM

## 2017-11-06 LAB — GLUCOSE, CAPILLARY
GLUCOSE-CAPILLARY: 112 mg/dL — AB (ref 65–99)
GLUCOSE-CAPILLARY: 75 mg/dL (ref 65–99)

## 2017-11-06 MED ORDER — LAMOTRIGINE 25 MG PO TABS
50.0000 mg | ORAL_TABLET | Freq: Two times a day (BID) | ORAL | Status: DC
  Administered 2017-11-06 – 2017-11-09 (×6): 50 mg via ORAL
  Filled 2017-11-06 (×6): qty 2

## 2017-11-06 MED ORDER — ETODOLAC 200 MG PO CAPS
200.0000 mg | ORAL_CAPSULE | Freq: Two times a day (BID) | ORAL | Status: DC
  Administered 2017-11-07 – 2017-11-09 (×5): 200 mg via ORAL
  Filled 2017-11-06 (×7): qty 1

## 2017-11-06 NOTE — Progress Notes (Signed)
Recreation Therapy Notes  Date: 11/06/2017  Time: 9:30 am  Location: Craft Room  Behavioral response: Appropriate  Intervention Topic: Leisure  Discussion/Intervention: Group content today was focused on leisure. The group defined what leisure is and some positive leisure activities they participate in. Individuals identified the difference between good and bad leisure. Participants expressed how they feel after participating in the leisure of their choice. The group discussed how they go about picking a leisure activity and if others are involved in their leisure activities. The patient stated how many leisure activities they too choose from and reasons why it is important to have leisure time. Individuals participated in the intervention "Leisure Jeopardy" where they had a chance to identify new leisure activities as well as benefits of leisure. Clinical Observations/Feedback:  Patient came to group and express when she was younger she use to play baseball and football. Individual participated in the intervention and was social with peers and staff during group. Kenyatte Chatmon LRT/CTRS         Justene Jensen 11/06/2017 12:01 PM

## 2017-11-06 NOTE — [facility] Group Notes (Signed)
11/06/2017 1PM  Type of Therapy and Topic:  Group Therapy:  Feelings around Relapse and Recovery  Participation Level:  Active   Description of Group:    Patients in this group will discuss emotions they experience before and after a relapse. They will process how experiencing these feelings, or avoidance of experiencing them, relates to having a relapse. Facilitator will guide patients to explore emotions they have related to recovery. Patients will be encouraged to process which emotions are more powerful. They will be guided to discuss the emotional reaction significant others in their lives may have to patients' relapse or recovery. Patients will be assisted in exploring ways to respond to the emotions of others without this contributing to a relapse.  Therapeutic Goals: 1. Patient will identify two or more emotions that lead to a relapse for them 2. Patient will identify two emotions that result when they relapse 3. Patient will identify two emotions related to recovery 4. Patient will demonstrate ability to communicate their needs through discussion and/or role plays   Summary of Patient Progress: Actively and appropriately engaged in the group. Patient was able to provide support and validation to other group members.Patient practiced active listening when interacting with the facilitator and other group members Patient in still in the process of obtaining treatment goals. Luma says "I use a lot of prayer to help me cope.  Recovery means to me to get back well to be able to work."    Therapeutic Modalities:   Cognitive Behavioral Therapy Solution-Focused Therapy Assertiveness Training Relapse Prevention Therapy   Darin Engels, LCSW 11/06/2017 1:51 PM

## 2017-11-06 NOTE — Progress Notes (Addendum)
Assisted to shave facial hair per her request; patient showered after.

## 2017-11-06 NOTE — Tx Team (Addendum)
Interdisciplinary Treatment and Diagnostic Plan Update  11/06/2017 Time of Session: [address] MRN: [id number]  Principal Diagnosis: Bipolar I disorder, current or most recent episode manic, with psychotic features ([place])  Secondary Diagnoses: Principal Problem:   Bipolar I disorder, current or most recent episode manic, with psychotic features ([place]) Active Problems:   HTN (hypertension), benign   OSA on CPAP   Hyperlipidemia   Controlled type 2 diabetes mellitus with diabetic nephropathy ([place])   Acquired hypothyroidism   Current Medications:  Current Facility-Administered Medications  Medication Dose Route Frequency Provider Last Rate Last Dose  . acetaminophen (TYLENOL) tablet 650 mg  650 mg Oral Q6H PRN Clapacs, Madie Reno, MD   650 mg at 11/05/17 1644  . alum & mag hydroxide-simeth (MAALOX/MYLANTA) 200-200-20 MG/5ML suspension 30 mL  30 mL Oral Q4H PRN Clapacs, John T, MD      . divalproex (DEPAKOTE) DR tablet 500 mg  500 mg Oral Q8H Pucilowska, Jolanta B, MD   500 mg at 11/06/17 0646  . hydrOXYzine (ATARAX/VISTARIL) tablet 50 mg  50 mg Oral TID PRN Clapacs, Madie Reno, MD   50 mg at 11/04/17 2233  . levothyroxine (SYNTHROID, LEVOTHROID) tablet 25 mcg  25 mcg Oral QAC breakfast Pucilowska, Jolanta B, MD   25 mcg at 11/06/17 0646  . losartan (COZAAR) tablet 50 mg  50 mg Oral Daily Clapacs, Madie Reno, MD   50 mg at 11/06/17 0841  . magnesium hydroxide (MILK OF MAGNESIA) suspension 30 mL  30 mL Oral Daily PRN Clapacs, John T, MD      . metFORMIN (GLUCOPHAGE) tablet 500 mg  500 mg Oral Q breakfast Clapacs, Madie Reno, MD   500 mg at 11/06/17 0841  . metoprolol tartrate (LOPRESSOR) tablet 37.5 mg  37.5 mg Oral BID Clapacs, Madie Reno, MD   37.5 mg at 11/06/17 0841  . [START ON 11/13/2017] paliperidone (INVEGA SUSTENNA) injection 234 mg  234 mg Intramuscular Q28 days Pucilowska, Jolanta B, MD      . QUEtiapine (SEROQUEL) tablet 300 mg  300 mg Oral QHS Clapacs, John T, MD   300 mg at 11/05/17  2201  . simvastatin (ZOCOR) tablet 20 mg  20 mg Oral q1800 Clapacs, Madie Reno, MD   20 mg at 11/05/17 1644  . traZODone (DESYREL) tablet 100 mg  100 mg Oral QHS PRN Clapacs, Madie Reno, MD   100 mg at 11/05/17 2200   PTA Medications: Medications Prior to Admission  Medication Sig Dispense Refill Last Dose  . ACCU-CHEK AVIVA PLUS test strip 1 each by Other route daily. E11.21 100 each 3 Taking  . divalproex (DEPAKOTE) 500 MG DR tablet Take 1 tablet (500 mg total) by mouth 2 (two) times daily with a meal. 60 tablet 3 Taking  . lamoTRIgine (LAMICTAL) 25 MG tablet Take 25 mg by mouth daily.   Taking  . levothyroxine (SYNTHROID, LEVOTHROID) 50 MCG tablet Take 1 tablet (50 mcg total) by mouth daily before breakfast. 30 tablet 11   . losartan (COZAAR) 50 MG tablet Take 1 tablet (50 mg total) by mouth daily. 30 tablet 11 Taking  . MELATONIN PO Take 1 tablet by mouth at bedtime.   Taking  . metFORMIN (GLUCOPHAGE) 500 MG tablet Take 1 tablet (500 mg total) by mouth daily with breakfast. 30 tablet 6 Taking  . metoprolol tartrate (LOPRESSOR) 25 MG tablet TAKE 1 & 1/2 TABLET BY MOUTH TWICE DAILY WITH FOOD 84 tablet 5 Taking  . nystatin (MYCOSTATIN/NYSTOP) powder Apply topically  3 (three) times daily. 60 g 1 Taking  . paliperidone (INVEGA SUSTENNA) 234 MG/1.5ML SUSP injection Inject 234 mg into the muscle every 30 (thirty) days.   Taking  . polyethylene glycol powder (GLYCOLAX/MIRALAX) powder TAKE 17 GRAMS BY MOUTH TWICE DAILY AS NEEDED FOR MODERATE CONSTIPATION. 527 g 3 Taking  . QUEtiapine (SEROQUEL) 200 MG tablet Take 200 mg by mouth at bedtime.   Taking  . simvastatin (ZOCOR) 20 MG tablet Take 1 tablet (20 mg total) by mouth at bedtime. 30 tablet 11 Taking    Patient Stressors: Medication change or noncompliance Other: Insomnia  Patient Strengths: Ability for insight Average or above average intelligence General fund of knowledge Motivation for treatment/growth Religious Affiliation  Treatment  Modalities: Medication Management, Group therapy, Case management,  1 to 1 session with clinician, Psychoeducation, Recreational therapy.   Physician Treatment Plan for Primary Diagnosis: Bipolar I disorder, current or most recent episode manic, with psychotic features ([place]) Long Term Goal(s): Improvement in symptoms so as ready for discharge NA   Short Term Goals: Ability to identify changes in lifestyle to reduce recurrence of condition will improve Ability to verbalize feelings will improve Ability to disclose and discuss suicidal ideas Ability to demonstrate self-control will improve Ability to identify and develop effective coping behaviors will improve Ability to maintain clinical measurements within normal limits will improve Compliance with prescribed medications will improve Ability to identify triggers associated with substance abuse/mental health issues will improve NA  Medication Management: Evaluate patient's response, side effects, and tolerance of medication regimen.  Therapeutic Interventions: 1 to 1 sessions, Unit Group sessions and Medication administration.  Evaluation of Outcomes: Progressing  Physician Treatment Plan for Secondary Diagnosis: Principal Problem:   Bipolar I disorder, current or most recent episode manic, with psychotic features ([place]) Active Problems:   HTN (hypertension), benign   OSA on CPAP   Hyperlipidemia   Controlled type 2 diabetes mellitus with diabetic nephropathy ([place])   Acquired hypothyroidism  Long Term Goal(s): Improvement in symptoms so as ready for discharge NA   Short Term Goals: Ability to identify changes in lifestyle to reduce recurrence of condition will improve Ability to verbalize feelings will improve Ability to disclose and discuss suicidal ideas Ability to demonstrate self-control will improve Ability to identify and develop effective coping behaviors will improve Ability to maintain clinical measurements within  normal limits will improve Compliance with prescribed medications will improve Ability to identify triggers associated with substance abuse/mental health issues will improve NA     Medication Management: Evaluate patient's response, side effects, and tolerance of medication regimen.  Therapeutic Interventions: 1 to 1 sessions, Unit Group sessions and Medication administration.  Evaluation of Outcomes: Progressing   RN Treatment Plan for Primary Diagnosis: Bipolar I disorder, current or most recent episode manic, with psychotic features ([place]) Long Term Goal(s): Knowledge of disease and therapeutic regimen to maintain health will improve  Short Term Goals: Ability to participate in decision making will improve, Ability to identify and develop effective coping behaviors will improve and Compliance with prescribed medications will improve  Medication Management: RN will administer medications as ordered by provider, will assess and evaluate patient's response and provide education to patient for prescribed medication. RN will report any adverse and/or side effects to prescribing provider.  Therapeutic Interventions: 1 on 1 counseling sessions, Psychoeducation, Medication administration, Evaluate responses to treatment, Monitor vital signs and CBGs as ordered, Perform/monitor CIWA, COWS, AIMS and Fall Risk screenings as ordered, Perform wound care treatments as ordered.  Evaluation  of Outcomes: Progressing   LCSW Treatment Plan for Primary Diagnosis: Bipolar I disorder, current or most recent episode manic, with psychotic features ([place]) Long Term Goal(s): Safe transition to appropriate next level of care at discharge, Engage patient in therapeutic group addressing interpersonal concerns.  Short Term Goals: Engage patient in aftercare planning with referrals and resources, Increase ability to appropriately verbalize feelings and Increase skills for wellness and recovery  Therapeutic  Interventions: Assess for all discharge needs, 1 to 1 time with Social worker, Explore available resources and support systems, Assess for adequacy in community support network, Educate family and significant other(s) on suicide prevention, Complete Psychosocial Assessment, Interpersonal group therapy.  Evaluation of Outcomes: Progressing   Progress in Treatment: Attending groups: Yes. Participating in groups: Yes. Taking medication as prescribed: Yes. Toleration medication: Yes. Family/Significant other contact made: Yes, individual(s) contacted:  pt's sister. Patient understands diagnosis: Yes. Discussing patient identified problems/goals with staff: Yes. Medical problems stabilized or resolved: Yes. Denies suicidal/homicidal ideation: Yes. Issues/concerns per patient self-inventory: No. Other: None at this time.   New problem(s) identified: No, Describe:  none at this time.   New Short Term/Long Term Goal(s): Pt reported her goal for treatment is to, "get well and not come back here."   Discharge Plan or Barriers: Pt will discharge home and will continue tx with her ACT Team.   Reason for Continuation of Hospitalization: Anxiety Medication stabilization  Estimated Length of Stay: 5 days  Recreational Therapy: Patient Stressors: Could not sleep. Patient Goal: Patient will engage in groups without prompting or encouragement from LRT x3 group sessions within 5 recreation therapy group sessions.  Attendees: Patient: Gina [name] 11/06/2017 11:19 AM  Physician: Dr. Bary Leriche, MD 11/06/2017 11:19 AM  Nursing: Elige Radon, RN  11/06/2017 11:19 AM  RN Care Manager: 11/06/2017 11:19 AM  Social Worker: Alden Hipp, LCSW 11/06/2017 11:19 AM  Recreational Therapist: Roanna Epley, CTRS-LRT 11/06/2017 11:19 AM  Other: Darin Engels, [place] 11/06/2017 11:19 AM  Other: Thea Silversmith, LCSW  11/06/2017 11:19 AM  Other: 11/06/2017 11:19 AM     Scribe for Treatment Team: Alden Hipp,  LCSW 11/06/2017 11:23 AM

## 2017-11-06 NOTE — Plan of Care (Signed)
Patient is responding well to treatment regimen , states her medicines is helping her wanders if she can afford them upon discharge, patient is participating in group scheduled activities, seen in the milieu area with peers, patient denies any SI/HI , educate patient on safety and 15 minute rounding , patient verbalize understanding of information received no acute distress noted. Problem: Education: Goal: Knowledge of [facility] Education information/materials will improve Outcome: Progressing Goal: Emotional status will improve Outcome: Progressing Goal: Mental status will improve Outcome: Progressing Goal: Verbalization of understanding the information provided will improve Outcome: Progressing   Problem: Activity: Goal: Interest or engagement in activities will improve Outcome: Progressing Goal: Sleeping patterns will improve Outcome: Progressing   Problem: Coping: Goal: Ability to demonstrate self-control will improve Outcome: Progressing   Problem: Health Behavior/Discharge Planning: Goal: Identification of resources available to assist in meeting health care needs will improve Outcome: Progressing Goal: Compliance with treatment plan for underlying cause of condition will improve Outcome: Progressing   Problem: Safety: Goal: Periods of time without injury will increase Outcome: Progressing   Problem: Clinical Measurements: Goal: Ability to maintain clinical measurements within normal limits will improve Outcome: Progressing

## 2017-11-06 NOTE — Progress Notes (Signed)
Received Gina [name] this am after breakfast, she was compliant with her medications. She denied all of the psychiatric symptoms and stated  feeling  tired. She rated feeling depressed 8/10 on her inventory sheet. She is OOB in the milieu at intervals.

## 2017-11-07 LAB — GLUCOSE, CAPILLARY
GLUCOSE-CAPILLARY: 136 mg/dL — AB (ref 65–99)
Glucose-Capillary: 85 mg/dL (ref 65–99)

## 2017-11-07 NOTE — Progress Notes (Signed)
D: Patient stated slept good last night .Stated appetite fair and energy level  Is normal. Stated concentration poor . Stated on Depression scale  6, hopeless 6 and anxiety 6 .( low 0-10 high) Denies suicidal  homicidal ideations  .  No auditory hallucinations  . Appropriate ADL'S. Interacting with peers and staff. Patient knowledge of information received  verbalize understanding . Emotion and Mental status   improved  Attending activities on  unit . Voice  no  concerns  around sleep. Attending unit programing , working on coping  skills . No anger outburst or problems  with self control  . No safety concerns   A: Encourage patient participation with unit programming . Instruction  Given on  Medication , verbalize understanding.  R: Voice no other concerns. Staff continue to monitor

## 2017-11-07 NOTE — Progress Notes (Signed)
[facility] MD Progress Note  11/07/2017 1:28 PM MEGAHN KILLINGS  MRN:  [id number]  Subjective:    The patient reports that she is no longer seeing Satan or having visions of him. She is relieved that [name] is gone. She is having visions of her parents who are deceased however. She does feel little depressed but denies any suicidal thoughts and says she does not believe in suicide because of her religion. She has been visible on the unit, pleasant and cooperative. She is compliant with medications and denies any adverse side effects. She denies any new somatic complaints. Vital signs are stable. She is still not sleeping well but did not receive her CPAP last night. Times spent reviewing labs with the patient including hep C positive. She was not aware of any cardiac problems in the past including prolongation of QTc interval.  Past psychiatric history. Long history of bipolar illness with multiple psychiatric hospitalizations and medications trials. She denies suicide attempts but reports that at times she came close "when satan took his pitchfork to her". 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  Family psychiatric history. Her sister does not read or write.  Social history. She lives with her sister and is disabled from mental illness. Her son lives out of state. She use to work in a [employer] but her father decided it was too stressful.   Principal Problem: Bipolar I disorder, current or most recent episode manic, with psychotic features ([place]) Diagnosis:   Patient Active Problem List   Diagnosis Date Noted  . Bipolar I disorder, current or most recent episode manic, with psychotic features ([place]) [F31.2] 11/04/2017  . Gallbladder polyp [K82.4] 10/21/2017  . Left shoulder pain [M25.512] 10/21/2017  . Benign neoplasm of ascending colon [D12.2]   . Bipolar affective disorder, current episode hypomanic ([place]) [F31.0] 04/08/2017  . Low back pain radiating to left lower extremity [M54.5,  M79.605] 02/26/2017  . Acquired hypothyroidism [E03.9] 09/10/2016  . Thrombocytopenia ([place]) [D69.6] 06/15/2016  . Medicare annual wellness visit, subsequent [Z00.00] 06/04/2016  . Advanced care planning/counseling discussion [Z71.89] 06/04/2016  . External hemorrhoid [K64.4] 04/02/2016  . Slow transit constipation [K59.01] 01/04/2016  . Bipolar I disorder, most recent episode (or current) manic ([place]) [F31.10] 12/03/2015  . NAFLD (nonalcoholic fatty liver disease) [K76.0] 03/28/2015  . Gallbladder calculus without cholecystitis [K80.20] 03/28/2015  . Gout [M10.9] 03/27/2015  . HCV antibody positive [R76.8] 03/27/2015  . History of bladder cancer [Z85.51] 03/27/2015  . History of bundle branch block [Z86.79] 03/27/2015  . Genetic testing [Z13.79] 02/28/2015  . Loss of memory [R41.3] 09/04/2014  . Controlled type 2 diabetes mellitus with diabetic nephropathy ([place]) [E11.21] 06/07/2014  . Hyperlipidemia [E78.5]   . Anemia in CKD (chronic kidney disease) [N18.9, D63.1] 01/26/2013  . Personal history of colonic adenomas and colon cancer [Z86.010] 11/11/2012  . Osteoarthritis of knee [M17.10] 10/02/2012  . OSA on CPAP [G47.33, Z99.89]   . CKD (chronic kidney disease) stage 3, GFR 30-59 ml/min (HCC) [N18.3]   . Severe obesity (BMI 35.0-39.9) with comorbidity ([place]) [E66.01]   . HTN (hypertension), benign [I10] 04/07/2012  . Hx of colon cancer, stage I [Z85.038] 08/05/2011   Total Time spent with patient: 20 minutes  Past Psychiatric History: bipolar disrder  Past Medical History:  Past Medical History:  Diagnosis Date  . Anemia in chronic kidney disease   . Bipolar depression [facility])    sees psychiatrist - psych admission 03/2015  . Bladder cancer ([place]) 1990's  .  Blood transfusion without reported diagnosis 2009  . Cataract    left  . CKD (chronic kidney disease) stage 3, GFR 30-59 ml/min (HCC)   . Colon cancer ([place]) 1990's  . DJD (degenerative joint disease), lumbar    chronic lower  back pain  . Enterocutaneous fistula 04/07/2012   completed PT 06/2012 ([facility])  . Family history of breast cancer   . Family history of colon cancer   . Family history of stomach cancer   . GERD (gastroesophageal reflux disease)   . History of bladder cancer 1997  . History of colon cancer    s/p surgery  . History of uterine cancer    s/p hysterectomy  . Hyperlipidemia   . Hypertension   . IDA (iron deficiency anemia) 01/2013   thought due to h/o polyps  . Obesity (BMI 30-39.9)   . OSA on CPAP    6cm H2O  . Personal history of colonic adenomas and colon cancer 11/11/2012  . Positive hepatitis C antibody test 09/2014   but negative confirmatory testing  . RBBB   . Uncontrolled type 2 diabetes mellitus with nephropathy ([place]) 06/07/2014   completed DSME 02/2016    Past Surgical History:  Procedure Laterality Date  . BREAST BIOPSY Right 01/2014   fibroadenoma  . COLONOSCOPY  07/2009  . COLONOSCOPY  11/2012   2 tubular adenomas, mild diverticulosis, pending genetic testing for Lynch syndrome [name]) rpt 2 yrs  . COLONOSCOPY  02/2015   TA, diverticulosis, rpt 2 yrs [name])  . COLONOSCOPY WITH PROPOFOL N/A 06/09/2017   TAx1, rpt 2 yrs [name], [name], MD)  . DEXA  12/2009   WNL  . DOBUTAMINE STRESS ECHO  12/2009   no evidence of ischemia  . HERNIA REPAIR  02/04/12  . i & d abdominal wound  02/19/12  . LEFT OOPHORECTOMY  2005  . PARTIAL COLECTOMY  about 2008   for colon cancer  . PARTIAL HYSTERECTOMY  1981   uterine cancer, R ovary remains  . Reexploration of abdominal wound and Allograft placemet  02/26/12  . Removal of infected mesh and abdominal wound vac placement  02/24/12  . sleep study  02/2014   OSA - AHI 55, nadir 81% [name])   Family History:  Family History  Problem Relation Age of Onset  . Colon cancer Mother 68  . Stomach cancer Mother        dx in her 24s?  . CAD Father        MI; deceased 74  . Mental illness Sister        anxiety/depression  . Thyroid  disease Sister   . Breast cancer Maternal Grandmother        age at dx unknown  . Diabetes Brother   . Diabetes Brother   . Diabetes Other        aunts and uncles both sides  . Arthritis Other        strong fmhx  . Colon cancer Other 92       maternal half-sister; deceased   Family Psychiatric  History: none reported Social History:  Social History   Substance and Sexual Activity  Alcohol Use No  . Alcohol/week: 0.0 oz     Social History   Substance and Sexual Activity  Drug Use No    Social History   Socioeconomic History  . Marital status: Single    Spouse name: Not on file  . Number of children: 0  . Years of education: Not  on file  . Highest education level: Not on file  Occupational History  . Not on file  Social Needs  . Financial resource strain: Not on file  . Food insecurity:    Worry: Not on file    Inability: Not on file  . Transportation needs:    Medical: Not on file    Non-medical: Not on file  Tobacco Use  . Smoking status: Never Smoker  . Smokeless tobacco: Never Used  Substance and Sexual Activity  . Alcohol use: No    Alcohol/week: 0.0 oz  . Drug use: No  . Sexual activity: Never  Lifestyle  . Physical activity:    Days per week: Not on file    Minutes per session: Not on file  . Stress: Not on file  Relationships  . Social connections:    Talks on phone: Not on file    Gets together: Not on file    Attends religious service: Not on file    Active member of club or organization: Not on file    Attends meetings of clubs or organizations: Not on file    Relationship status: Not on file  Other Topics Concern  . Not on file  Social History Narrative   Lives with sister, no pets   Occupation: disabled, for bipolar and arthritis   Edu: GED   Activity: take walks   Diet: good water, vegetables daily   Religion: [place], Dr. Jimmye Norman (ph [phone])   Additional Social History:    Pain  Medications: see PTA Prescriptions: see PTA Over the Counter: see PTA History of alcohol / drug use?: No history of alcohol / drug abuse Longest period of sobriety (when/how long): (N/A) Negative Consequences of Use: (N/A) Withdrawal Symptoms: (N/A)                    Sleep: Poor  Appetite:  Fair  Current Medications: Current Facility-Administered Medications  Medication Dose Route Frequency Provider Last Rate Last Dose  . acetaminophen (TYLENOL) tablet 650 mg  650 mg Oral Q6H PRN Clapacs, Madie Reno, MD   650 mg at 11/07/17 0901  . alum & mag hydroxide-simeth (MAALOX/MYLANTA) 200-200-20 MG/5ML suspension 30 mL  30 mL Oral Q4H PRN Clapacs, John T, MD      . divalproex (DEPAKOTE) DR tablet 500 mg  500 mg Oral Q8H Pucilowska, Jolanta B, MD   500 mg at 11/07/17 0630  . etodolac (LODINE) capsule 200 mg  200 mg Oral BID Pucilowska, Jolanta B, MD   200 mg at 11/07/17 0857  . hydrOXYzine (ATARAX/VISTARIL) tablet 50 mg  50 mg Oral TID PRN Clapacs, Madie Reno, MD   50 mg at 11/04/17 2233  . lamoTRIgine (LAMICTAL) tablet 50 mg  50 mg Oral BID Pucilowska, Jolanta B, MD   50 mg at 11/07/17 0856  . levothyroxine (SYNTHROID, LEVOTHROID) tablet 25 mcg  25 mcg Oral QAC breakfast Pucilowska, Jolanta B, MD   25 mcg at 11/07/17 0630  . losartan (COZAAR) tablet 50 mg  50 mg Oral Daily Clapacs, Madie Reno, MD   50 mg at 11/07/17 0856  . magnesium hydroxide (MILK OF MAGNESIA) suspension 30 mL  30 mL Oral Daily PRN Clapacs, John T, MD      . metFORMIN (GLUCOPHAGE) tablet 500 mg  500 mg Oral Q breakfast Clapacs, Madie Reno, MD   500 mg at 11/07/17 0856  . metoprolol tartrate (LOPRESSOR) tablet  37.5 mg  37.5 mg Oral BID Clapacs, Madie Reno, MD   37.5 mg at 11/06/17 1846  . [START ON 11/13/2017] paliperidone (INVEGA SUSTENNA) injection 234 mg  234 mg Intramuscular Q28 days Pucilowska, Jolanta B, MD      . QUEtiapine (SEROQUEL) tablet 300 mg  300 mg Oral QHS Clapacs, John T, MD   300 mg at 11/06/17 2156  . simvastatin (ZOCOR)  tablet 20 mg  20 mg Oral q1800 Clapacs, Madie Reno, MD   20 mg at 11/06/17 1846  . traZODone (DESYREL) tablet 100 mg  100 mg Oral QHS PRN Clapacs, Madie Reno, MD   100 mg at 11/05/17 2200    Lab Results:  Results for orders placed or performed during the hospital encounter of 11/04/17 (from the past 48 hour(s))  Glucose, capillary     Status: Abnormal   Collection Time: 11/06/17  6:50 AM  Result Value Ref Range   Glucose-Capillary 112 (H) 65 - 99 mg/dL  Glucose, capillary     Status: None   Collection Time: 11/06/17  9:27 PM  Result Value Ref Range   Glucose-Capillary 75 65 - 99 mg/dL  Glucose, capillary     Status: Abnormal   Collection Time: 11/07/17  7:08 AM  Result Value Ref Range   Glucose-Capillary 136 (H) 65 - 99 mg/dL    Blood Alcohol level:  Lab Results  Component Value Date   ETH <10 11/03/2017   ETH <5 38/46/6599    Metabolic Disorder Labs: Lab Results  Component Value Date   HGBA1C 6.3 09/30/2017   MPG 114 04/14/2012   Lab Results  Component Value Date   PROLACTIN 33.6 (H) 12/04/2015   Lab Results  Component Value Date   CHOL 161 09/30/2017   TRIG 176.0 (H) 09/30/2017   HDL 44.50 09/30/2017   CHOLHDL 4 09/30/2017   VLDL 35.2 09/30/2017   LDLCALC 81 09/30/2017   LDLCALC 52 12/04/2015    Physical Findings: AIMS: Facial and Oral Movements Muscles of Facial Expression: None, normal Lips and Perioral Area: None, normal Jaw: None, normal Tongue: None, normal,Extremity Movements Upper (arms, wrists, hands, fingers): None, normal Lower (legs, knees, ankles, toes): None, normal, Trunk Movements Neck, shoulders, hips: None, normal, Overall Severity Severity of abnormal movements (highest score from questions above): None, normal Incapacitation due to abnormal movements: None, normal Patient's awareness of abnormal movements (rate only patient's report): No Awareness, Dental Status Current problems with teeth and/or dentures?: Yes Does patient usually wear  dentures?: Yes  CIWA:  CIWA-Ar Total: 0 COWS:  COWS Total Score: 0  Musculoskeletal: Strength & Muscle Tone: decreased Gait & Station: unsteady Patient leans: N/A  Psychiatric Specialty Exam: Physical Exam  Nursing note and vitals reviewed. Psychiatric: Her speech is normal. Her affect is labile. She is actively hallucinating. Thought content is delusional. Cognition and memory are impaired. She expresses impulsivity.    Review of Systems  Constitutional: Negative.   HENT: Negative.   Eyes: Negative.   Respiratory: Negative.   Cardiovascular: Negative.   Gastrointestinal: Negative.   Genitourinary: Negative.   Musculoskeletal: Positive for joint pain.  Skin: Negative.   Neurological: Negative.   Endo/Heme/Allergies: Negative.   Psychiatric/Behavioral: Positive for memory loss. The patient has insomnia.     Blood pressure (!) 102/49, pulse 88, temperature 97.9 F (36.6 C), temperature source Oral, resp. rate 18, height 5' 2"  (1.575 m), weight 89.8 kg (198 lb), SpO2 100 %.Body mass index is 36.21 kg/m.  General Appearance: Casual  Eye Contact:  Good  Speech:  Clear and Coherent and Normal Rate  Volume:  Normal  Mood:  Anxious  Affect:  Congruent  Thought Process:  Disorganized and Descriptions of Associations: Tangential  Orientation:  Full (Time, Place, and Person)  Thought Content:  Hallucinations: Visual  Suicidal Thoughts:  No  Homicidal Thoughts:  No  Memory:  Immediate;   Poor Recent;   Poor Remote;   Poor  Judgement:  Impaired  Insight:  Shallow  Psychomotor Activity:  Normal  Concentration:  Concentration: Poor and Attention Span: Poor  Recall:  Poor  Fund of Knowledge:  Fair  Language:  Fair  Akathisia:  No  Handed:  Right  AIMS (if indicated):     Assets:  Communication Skills Desire for Improvement Financial Resources/Insurance Housing Physical Health Resilience Social Support  ADL's:  Intact  Cognition:  WNL  Sleep:  Number of Hours: 7.45      Treatment Plan Summary: Daily contact with patient to assess and evaluate symptoms and progress in treatment and Medication management   Assesment. [name] is a 67 year old female with a history of bipolar disorder admitted in a manic, psychotic episode in the context of medication noncompliance. She is stil disorganized, hallucinating, grandiose, hyperverbal and hypereligius. Sleep is still interrupted. She complains of bilateral knee pain and walks with difficulty needs PT. Her BP is low, pulse slightly elevated. She was diagnosed with hypothyroidism in February needs thyroid function rechecked. Has sleep apnea.  #Psychosis, still psychotic, grandiose, hyperverbal, hypereligius, VH -continue Depakote 500 mg TID, level on admission 50 likely noncomplinace. Will need to repeat VPA level and LFTs -the patient is currently on Seroquel 300 mg by mouth nightly but will hold medication until after EKG is complete. If QTC remains longed, will need to call cardiology. -continue Invega sustenna 234 mg injections on 4/12 -EKG, reviewed NSR QTc 497 on 11/03/17. We'll repeat EKGue to QTc prolongation -continue lamictal 50 mg BID -having the Lamictal on board can change Depakote level  #Insomnia, sleep interrupted -continue Trazodone 100 mg nightly  #DM, stable -morning BS reviewed -continue Metformin 500 mg daily  -continue ADA diet and BS monitoring; blood sugars are controlled -HgbA1C on 2/27 was 6.3  #HTN, BP low -lowered Metoprolol to 25 mg BID -Cozaar 50 mg daily  #Dislipidemia, lipid panel on 2/27 shows elevated TG -continue Zocor 20 mg daily  #Hypothyroidism, elevated TSH and low T4 on 2/27, started on Synthroid -continue Synthroid 25 ug daily -recheck thyroid function tests  #Metabolic syndrome monitoring -total cholesterol was 161 and hemoglobin A1c was 6.3. -EKGdischarge QTc prolongation. Will repeat EKG,  #Sleep apnea -CPAP machine ordered - She did not get CPAP  last night and order placed for 2nd time today  #Knee pain -PT cosnult requested  #Admission status -voluntary admisssion  #Discharge planning -discharge to home with the sister -follow up with [facility] ACT team    Chauncey Mann, MD 11/07/2017, 1:28 PM

## 2017-11-07 NOTE — Plan of Care (Signed)
Patient knowledge of information received  verbalize understanding . Emotion and Mental status   improved  Attending activities on  unit . Voice  no  concerns  around sleep. Attending unit programing , working on coping  skills . No anger outburst or problems  with self control  . No safety concerns

## 2017-11-07 NOTE — Progress Notes (Signed)
PT Cancellation Note  Patient Details Name: Gina [name] MRN: [id number] DOB: 1950/10/14   Cancelled Treatment:    Reason Eval/Treat Not Completed: Other (comment)(Chart reviewed, RN consulted. Pt is not imminently discharging at this time. Pt reports Left knee pain is chronic without acute exacerbation related to Left knee osteoarthritis. ). No acute PT needs at this time. Pt independent with all mobility and at baseline level for function as well as pain. This can be addressed as an outpatient.   Told patient I will return tomorrow time permitting with basic home exercise program to address knee mobility and strengthening. At DC pt will benefit from HHPT services set up d/t lack of transportation.   5:14 PM, 11/07/17 Etta Grandchild, PT, DPT Physical Therapist - [facility]  [phone] ([place])    Divonte Senger C 11/07/2017, 5:12 PM

## 2017-11-07 NOTE — B[facility] Group Notes (Signed)
LCSW Group Therapy Note  11/07/2017 1:15pm  Type of Therapy and Topic:  Group Therapy:  Fears and Unhealthy Coping Skills  Participation Level:  Minimal   Description of Group:  The focus of this group was to discuss some of the prevalent fears that patients experience, and to identify the commonalities among group members.  An exercise was used to initiate the discussion, followed by writing on the white board a group-generated list of unhealthy coping and healthy coping techniques to deal with each fear.    Therapeutic Goals: 1. Patient will identify and describe 3 fears they experience 2. Patient will identify one positive coping strategy for each fear they experience 3. Patient will respond empathically to peers statements regarding fears they experience  Summary of Patient Progress:  Patient stated that she likes to be called [name] and that she feels good today. She shared with the group she likes to talk to people and pray for people. Patient shared her religious beliefs with the group and her  Concerns about her mental condition.      Therapeutic Modalities Cognitive Behavioral Therapy Motivational Interviewing  Gina [name]  CUEBAS-COLON, LCSW 11/07/2017 12:53 PM

## 2017-11-08 LAB — GLUCOSE, CAPILLARY: GLUCOSE-CAPILLARY: 108 mg/dL — AB (ref 65–99)

## 2017-11-08 MED ORDER — SENNA 8.6 MG PO TABS
1.0000 | ORAL_TABLET | Freq: Every evening | ORAL | Status: DC | PRN
Start: 2017-11-08 — End: 2017-11-09

## 2017-11-08 MED ORDER — DOCUSATE SODIUM 100 MG PO CAPS
100.0000 mg | ORAL_CAPSULE | Freq: Two times a day (BID) | ORAL | Status: DC
  Administered 2017-11-08 – 2017-11-09 (×2): 100 mg via ORAL
  Filled 2017-11-08 (×2): qty 1

## 2017-11-08 NOTE — Plan of Care (Signed)
Pt has been calm and cooperative. Pt is medication complaint. Pt denies thoughts of si/ah/vh/hi and pain. Pt c/o insomnia, prn medication given per MAR. Pt CPAP was brought here; however the outlets in the room does not work. Will have first shift to follow up on outlets. Remains on Q 15 mins safety rounds. Will cont to monitor pt. Problem: Education: Goal: Knowledge of [facility] Education information/materials will improve Outcome: Progressing Goal: Emotional status will improve Outcome: Progressing Goal: Mental status will improve Outcome: Progressing Goal: Verbalization of understanding the information provided will improve Outcome: Progressing   Problem: Activity: Goal: Interest or engagement in activities will improve Outcome: Progressing Goal: Sleeping patterns will improve Outcome: Progressing   Problem: Coping: Goal: Ability to verbalize frustrations and anger appropriately will improve Outcome: Progressing Goal: Ability to demonstrate self-control will improve Outcome: Progressing   Problem: Health Behavior/Discharge Planning: Goal: Identification of resources available to assist in meeting health care needs will improve Outcome: Progressing Goal: Compliance with treatment plan for underlying cause of condition will improve Outcome: Progressing   Problem: Safety: Goal: Periods of time without injury will increase Outcome: Progressing   Problem: Clinical Measurements: Goal: Ability to maintain clinical measurements within normal limits will improve Outcome: Progressing   Problem: Spiritual Needs Goal: Ability to function at adequate level Outcome: Progressing

## 2017-11-08 NOTE — Progress Notes (Signed)
[facility] MD Progress Note  11/08/2017 3:57 PM Gina [name]  MRN:  [id number]  Subjective:    The patient says she is no longer having any visual hallucinations of Satan but did have some hallucinations of her deceased parents. She denies any auditory hallucinations. She is struggling with some problems with short-term memory but no other new complaints. She denies any current suicidal thoughts and says she would never try to hurt herself because of her religion. Mood is slowly improving. She denies any major panic attacks dos struggle with anxiety associated with hallucinations and thoughts of Satan. She denies any problems with insomnia but did sleep over 6 hours last night. Appetite is good. She is having problems with constipation but finally had a bowel movement today. Vital signs are stable.  Past psychiatric history. Long history of bipolar illness with multiple psychiatric hospitalizations and medications trials. She denies suicide attempts but reports that at times she came close "when satan took his pitchfork to her". 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  Family psychiatric history. Her sister does not read or write.  Social history. She lives with her sister and is disabled from mental illness. Her son lives out of state. She use to work in a [place] but her father decided it was too stressful.   Principal Problem: Bipolar I disorder, current or most recent episode manic, with psychotic features ([place]) Diagnosis:   Patient Active Problem List   Diagnosis Date Noted  . Bipolar I disorder, current or most recent episode manic, with psychotic features ([place]) [F31.2] 11/04/2017  . Gallbladder polyp [K82.4] 10/21/2017  . Left shoulder pain [M25.512] 10/21/2017  . Benign neoplasm of ascending colon [D12.2]   . Bipolar affective disorder, current episode hypomanic ([place]) [F31.0] 04/08/2017  . Low back pain radiating to left lower extremity [M54.5, M79.605] 02/26/2017  .  Acquired hypothyroidism [E03.9] 09/10/2016  . Thrombocytopenia ([place]) [D69.6] 06/15/2016  . Medicare annual wellness visit, subsequent [Z00.00] 06/04/2016  . Advanced care planning/counseling discussion [Z71.89] 06/04/2016  . External hemorrhoid [K64.4] 04/02/2016  . Slow transit constipation [K59.01] 01/04/2016  . Bipolar I disorder, most recent episode (or current) manic ([place]) [F31.10] 12/03/2015  . NAFLD (nonalcoholic fatty liver disease) [K76.0] 03/28/2015  . Gallbladder calculus without cholecystitis [K80.20] 03/28/2015  . Gout [M10.9] 03/27/2015  . HCV antibody positive [R76.8] 03/27/2015  . History of bladder cancer [Z85.51] 03/27/2015  . History of bundle branch block [Z86.79] 03/27/2015  . Genetic testing [Z13.79] 02/28/2015  . Loss of memory [R41.3] 09/04/2014  . Controlled type 2 diabetes mellitus with diabetic nephropathy ([place]) [E11.21] 06/07/2014  . Hyperlipidemia [E78.5]   . Anemia in CKD (chronic kidney disease) [N18.9, D63.1] 01/26/2013  . Personal history of colonic adenomas and colon cancer [Z86.010] 11/11/2012  . Osteoarthritis of knee [M17.10] 10/02/2012  . OSA on CPAP [G47.33, Z99.89]   . CKD (chronic kidney disease) stage 3, GFR 30-59 ml/min (HCC) [N18.3]   . Severe obesity (BMI 35.0-39.9) with comorbidity ([place]) [E66.01]   . HTN (hypertension), benign [I10] 04/07/2012  . Hx of colon cancer, stage I [Z85.038] 08/05/2011   Total Time spent with patient: 20 minutes  Past Psychiatric History: bipolar disrder  Past Medical History:  Past Medical History:  Diagnosis Date  . Anemia in chronic kidney disease   . Bipolar depression [facility])    sees psychiatrist - psych admission 03/2015  . Bladder cancer ([place]) 1990's  . Blood transfusion without reported diagnosis 2009  . Cataract  left  . CKD (chronic kidney disease) stage 3, GFR 30-59 ml/min (HCC)   . Colon cancer ([place]) 1990's  . DJD (degenerative joint disease), lumbar    chronic lower back pain  .  Enterocutaneous fistula 04/07/2012   completed PT 06/2012 ([facility])  . Family history of breast cancer   . Family history of colon cancer   . Family history of stomach cancer   . GERD (gastroesophageal reflux disease)   . History of bladder cancer 1997  . History of colon cancer    s/p surgery  . History of uterine cancer    s/p hysterectomy  . Hyperlipidemia   . Hypertension   . IDA (iron deficiency anemia) 01/2013   thought due to h/o polyps  . Obesity (BMI 30-39.9)   . OSA on CPAP    6cm H2O  . Personal history of colonic adenomas and colon cancer 11/11/2012  . Positive hepatitis C antibody test 09/2014   but negative confirmatory testing  . RBBB   . Uncontrolled type 2 diabetes mellitus with nephropathy ([place]) 06/07/2014   completed DSME 02/2016    Past Surgical History:  Procedure Laterality Date  . BREAST BIOPSY Right 01/2014   fibroadenoma  . COLONOSCOPY  07/2009  . COLONOSCOPY  11/2012   2 tubular adenomas, mild diverticulosis, pending genetic testing for Lynch syndrome [name]) rpt 2 yrs  . COLONOSCOPY  02/2015   TA, diverticulosis, rpt 2 yrs [name])  . COLONOSCOPY WITH PROPOFOL N/A 06/09/2017   TAx1, rpt 2 yrs [name], [name], MD)  . DEXA  12/2009   WNL  . DOBUTAMINE STRESS ECHO  12/2009   no evidence of ischemia  . HERNIA REPAIR  02/04/12  . i & d abdominal wound  02/19/12  . LEFT OOPHORECTOMY  2005  . PARTIAL COLECTOMY  about 2008   for colon cancer  . PARTIAL HYSTERECTOMY  1981   uterine cancer, R ovary remains  . Reexploration of abdominal wound and Allograft placemet  02/26/12  . Removal of infected mesh and abdominal wound vac placement  02/24/12  . sleep study  02/2014   OSA - AHI 55, nadir 81% [name])   Family History:  Family History  Problem Relation Age of Onset  . Colon cancer Mother 45  . Stomach cancer Mother        dx in her 46s?  . CAD Father        MI; deceased 55  . Mental illness Sister        anxiety/depression  . Thyroid disease Sister    . Breast cancer Maternal Grandmother        age at dx unknown  . Diabetes Brother   . Diabetes Brother   . Diabetes Other        aunts and uncles both sides  . Arthritis Other        strong fmhx  . Colon cancer Other 57       maternal half-sister; deceased   Family Psychiatric  History: none reported Social History:  Social History   Substance and Sexual Activity  Alcohol Use No  . Alcohol/week: 0.0 oz     Social History   Substance and Sexual Activity  Drug Use No    Social History   Socioeconomic History  . Marital status: Single    Spouse name: Not on file  . Number of children: 0  . Years of education: Not on file  . Highest education level: Not on file  Occupational  History  . Not on file  Social Needs  . Financial resource strain: Not on file  . Food insecurity:    Worry: Not on file    Inability: Not on file  . Transportation needs:    Medical: Not on file    Non-medical: Not on file  Tobacco Use  . Smoking status: Never Smoker  . Smokeless tobacco: Never Used  Substance and Sexual Activity  . Alcohol use: No    Alcohol/week: 0.0 oz  . Drug use: No  . Sexual activity: Never  Lifestyle  . Physical activity:    Days per week: Not on file    Minutes per session: Not on file  . Stress: Not on file  Relationships  . Social connections:    Talks on phone: Not on file    Gets together: Not on file    Attends religious service: Not on file    Active member of club or organization: Not on file    Attends meetings of clubs or organizations: Not on file    Relationship status: Not on file  Other Topics Concern  . Not on file  Social History Narrative   Lives with sister, no pets   Occupation: disabled, for bipolar and arthritis   Edu: GED   Activity: take walks   Diet: good water, vegetables daily   Religion: [place], Dr. Jimmye Norman (ph [phone])   Additional Social History:    Pain Medications: see  PTA Prescriptions: see PTA Over the Counter: see PTA History of alcohol / drug use?: No history of alcohol / drug abuse Longest period of sobriety (when/how long): (N/A) Negative Consequences of Use: (N/A) Withdrawal Symptoms: (N/A)                    Sleep: Poor  Appetite:  Fair  Current Medications: Current Facility-Administered Medications  Medication Dose Route Frequency Provider Last Rate Last Dose  . acetaminophen (TYLENOL) tablet 650 mg  650 mg Oral Q6H PRN Clapacs, Madie Reno, MD   650 mg at 11/08/17 0909  . alum & mag hydroxide-simeth (MAALOX/MYLANTA) 200-200-20 MG/5ML suspension 30 mL  30 mL Oral Q4H PRN Clapacs, Madie Reno, MD   30 mL at 11/07/17 2128  . divalproex (DEPAKOTE) DR tablet 500 mg  500 mg Oral Q8H Pucilowska, Jolanta B, MD   500 mg at 11/08/17 0628  . docusate sodium (COLACE) capsule 100 mg  100 mg Oral BID Chauncey Mann, MD      . etodolac (LODINE) capsule 200 mg  200 mg Oral BID Pucilowska, Jolanta B, MD   200 mg at 11/08/17 0900  . hydrOXYzine (ATARAX/VISTARIL) tablet 50 mg  50 mg Oral TID PRN Clapacs, Madie Reno, MD   50 mg at 11/04/17 2233  . lamoTRIgine (LAMICTAL) tablet 50 mg  50 mg Oral BID Pucilowska, Jolanta B, MD   50 mg at 11/08/17 0900  . levothyroxine (SYNTHROID, LEVOTHROID) tablet 25 mcg  25 mcg Oral QAC breakfast Pucilowska, Jolanta B, MD   25 mcg at 11/08/17 0629  . losartan (COZAAR) tablet 50 mg  50 mg Oral Daily Clapacs, John T, MD   50 mg at 11/08/17 0900  . magnesium hydroxide (MILK OF MAGNESIA) suspension 30 mL  30 mL Oral Daily PRN Clapacs, Madie Reno, MD   30 mL at 11/08/17 0909  . metFORMIN (GLUCOPHAGE) tablet 500 mg  500 mg Oral Q breakfast Clapacs, John  T, MD   500 mg at 11/08/17 0900  . metoprolol tartrate (LOPRESSOR) tablet 37.5 mg  37.5 mg Oral BID Clapacs, John T, MD   37.5 mg at 11/08/17 0900  . [START ON 11/13/2017] paliperidone (INVEGA SUSTENNA) injection 234 mg  234 mg Intramuscular Q28 days Pucilowska, Jolanta B, MD      . QUEtiapine  (SEROQUEL) tablet 300 mg  300 mg Oral QHS Clapacs, John T, MD   300 mg at 11/07/17 2127  . senna (SENOKOT) tablet 8.6 mg  1 tablet Oral QHS PRN Chauncey Mann, MD      . simvastatin (ZOCOR) tablet 20 mg  20 mg Oral q1800 Clapacs, Madie Reno, MD   20 mg at 11/07/17 1705  . traZODone (DESYREL) tablet 100 mg  100 mg Oral QHS PRN Clapacs, Madie Reno, MD   100 mg at 11/07/17 2127    Lab Results:  Results for orders placed or performed during the hospital encounter of 11/04/17 (from the past 48 hour(s))  Glucose, capillary     Status: None   Collection Time: 11/06/17  9:27 PM  Result Value Ref Range   Glucose-Capillary 75 65 - 99 mg/dL  Glucose, capillary     Status: Abnormal   Collection Time: 11/07/17  7:08 AM  Result Value Ref Range   Glucose-Capillary 136 (H) 65 - 99 mg/dL  Glucose, capillary     Status: None   Collection Time: 11/07/17  9:00 PM  Result Value Ref Range   Glucose-Capillary 85 65 - 99 mg/dL  Glucose, capillary     Status: Abnormal   Collection Time: 11/08/17  7:15 AM  Result Value Ref Range   Glucose-Capillary 108 (H) 65 - 99 mg/dL    Blood Alcohol level:  Lab Results  Component Value Date   ETH <10 11/03/2017   ETH <5 59/16/3846    Metabolic Disorder Labs: Lab Results  Component Value Date   HGBA1C 6.3 09/30/2017   MPG 114 04/14/2012   Lab Results  Component Value Date   PROLACTIN 33.6 (H) 12/04/2015   Lab Results  Component Value Date   CHOL 161 09/30/2017   TRIG 176.0 (H) 09/30/2017   HDL 44.50 09/30/2017   CHOLHDL 4 09/30/2017   VLDL 35.2 09/30/2017   LDLCALC 81 09/30/2017   LDLCALC 52 12/04/2015    Physical Findings: AIMS: Facial and Oral Movements Muscles of Facial Expression: None, normal Lips and Perioral Area: None, normal Jaw: None, normal Tongue: None, normal,Extremity Movements Upper (arms, wrists, hands, fingers): None, normal Lower (legs, knees, ankles, toes): None, normal, Trunk Movements Neck, shoulders, hips: None, normal, Overall  Severity Severity of abnormal movements (highest score from questions above): None, normal Incapacitation due to abnormal movements: None, normal Patient's awareness of abnormal movements (rate only patient's report): No Awareness, Dental Status Current problems with teeth and/or dentures?: Yes Does patient usually wear dentures?: Yes  CIWA:  CIWA-Ar Total: 0 COWS:  COWS Total Score: 0  Musculoskeletal: Strength & Muscle Tone: decreased Gait & Station: unsteady Patient leans: N/A  Psychiatric Specialty Exam: Physical Exam  Nursing note and vitals reviewed. Psychiatric: Her speech is normal. Her affect is labile. She is actively hallucinating. Thought content is delusional. Cognition and memory are impaired. She expresses impulsivity.    Review of Systems  Constitutional: Negative.   HENT: Negative.   Eyes: Negative.   Respiratory: Negative.   Cardiovascular: Negative.   Gastrointestinal: Positive for constipation. Negative for abdominal pain, diarrhea, heartburn, nausea and vomiting.  Genitourinary:  Negative.   Musculoskeletal: Positive for joint pain.  Skin: Negative.   Neurological: Negative.   Endo/Heme/Allergies: Negative.   Psychiatric/Behavioral: Positive for depression and memory loss. The patient has insomnia.     Blood pressure 119/64, pulse 81, temperature 97.8 F (36.6 C), temperature source Oral, resp. rate 16, height 5' 2"  (1.575 m), weight 89.8 kg (198 lb), SpO2 98 %.Body mass index is 36.21 kg/m.  General Appearance: Casual  Eye Contact:  Good  Speech:  Clear and Coherent and Normal Rate  Volume:  Normal  Mood:  Mildly Anxious  Affect:  Congruent  Thought Process: Tangential  Orientation:  Full (Time, Place, and Person)  Thought Content:  VH have decreased and are almost gone  Suicidal Thoughts:  No  Homicidal Thoughts:  No  Memory:  Immediate;   Poor Recent;   Poor Remote;   Poor  Judgement:  Impaired  Insight:  Shallow  Psychomotor Activity:   Normal  Concentration:  Concentration: Poor and Attention Span: Poor  Recall:  Poor  Fund of Knowledge:  Fair  Language:  Fair  Akathisia:  No  Handed:  Right  AIMS (if indicated):     Assets:  Communication Skills Desire for Improvement Financial Resources/Insurance Housing Physical Health Resilience Social Support  ADL's:  Intact  Cognition:  WNL  Sleep:  Number of Hours: 6.15     Treatment Plan Summary: Daily contact with patient to assess and evaluate symptoms and progress in treatment and Medication management   Assesment. [name] is a 67 year old female with a history of bipolar disorder admitted in a manic, psychotic episode in the context of medication noncompliance. She is stil disorganized, hallucinating, grandiose, hyperverbal and hypereligius. Sleep is still interrupted. She complains of bilateral knee pain and walks with difficulty needs PT. Her BP is low, pulse slightly elevated. She was diagnosed with hypothyroidism in February needs thyroid function rechecked. Has sleep apnea.  #Psychosis, still psychotic, grandiose, hyperverbal, hypereligius, VH continue Depakote 500 mg TID, level on admission 50 likely noncomplinace. Will need to repeat VPA level and LFTs She will continue onSeroquel 300 mg by mouth nightly. continue Invega sustenna 234 mg injections on 4/12 EKG, reviewed NSR QTc 497 on 11/03/17 but repeat EKG showed a QTc of 493 - will need to monitor QTc Continue lamictal 50 mg BID Having the Lamictal on board can change Depakote level and will monitor closely  #Insomnia, sleep interrupted -continue Trazodone 100 mg nightly  #DM, stable -morning BS reviewed -continue Metformin 500 mg daily  -continue ADA diet and BS monitoring; blood sugars are controlled -HgbA1C on 2/27 was 6.3  #HTN, BP low -lowered Metoprolol to 25 mg BID -Cozaar 50 mg daily  #Dislipidemia, lipid panel on 2/27 shows elevated TG -continue Zocor 20 mg  daily  #Hypothyroidism, elevated TSH and low T4 on 2/27, started on Synthroid -continue Synthroid 25 ug daily -recheck thyroid function tests  #Metabolic syndrome monitoring -total cholesterol was 161 and hemoglobin A1c was 6.3. -EKGdischarge QTc prolongation. Will repeat EKG,  #Sleep apnea -CPAP machine ordered - She did not get CPAP last night secondary to a problem with electrical outlet  #Knee pain -Continue PT  #Admission status -voluntary admisssion  #Discharge planning -discharge to home with the sister -follow up with [facility] ACT team    Chauncey Mann, MD 11/08/2017, 3:57 PM

## 2017-11-08 NOTE — Progress Notes (Signed)
D- Patient alert and oriented. Patient presents in a pleasant mood on assessment stating that she slept "not too bad" last night. Patient rates her depression a "10/10" stating to this writer "I miss my parents" and rates her anxiety level a "10/10" stating that she's anxious because "when I see the good Lord". Patient denies SI, HI, AVH, at this time. Patient's goal for today is "to get better; get well", in which patient intends on "praying" in order to accomplish her goals.  A- Scheduled medications administered to patient, per MD orders. Support and encouragement provided.  Routine safety checks conducted every 15 minutes.  Patient informed to notify staff with problems or concerns.  R- No adverse drug reactions noted. Patient contracts for safety at this time. Patient compliant with medications and treatment plan. Patient receptive, calm, and cooperative. Patient interacts well with others on the unit.  Patient remains safe at this time.

## 2017-11-08 NOTE — [facility] Group Notes (Signed)
LCSW Group Therapy Note 11/08/2017 1:15pm  Type of Therapy and Topic: Group Therapy: Feelings Around Returning Home & Establishing a Supportive Framework and Supporting Oneself When Supports Not Available  Participation Level: Active  Description of Group:  Patients first processed thoughts and feelings about upcoming discharge. These included fears of upcoming changes, lack of change, new living environments, judgements and expectations from others and overall stigma of mental health issues. The group then discussed the definition of a supportive framework, what that looks and feels like, and how do to discern it from an unhealthy non-supportive network. The group identified different types of supports as well as what to do when your family/friends are less than helpful or unavailable  Therapeutic Goals  1. Patient will identify one healthy supportive network that they can use at discharge. 2. Patient will identify one factor of a supportive framework and how to tell it from an unhealthy network. 3. Patient able to identify one coping skill to use when they do not have positive supports from others. 4. Patient will demonstrate ability to communicate their needs through discussion and/or role plays.  Summary of Patient Progress:  Patient stated she doesn't feel well today, she had trouble sleeping. Pt engaged during group session. As patients processed their anxiety about discharge and described healthy supports patient shared that she is not ready to go home. She stated that her only support is her sister and they are both sick.  Patients identified at least one self-care tool they were willing to use after discharge; prayer.   Therapeutic Modalities Cognitive Behavioral Therapy Motivational Interviewing   Gina [name]  CUEBAS-COLON, LCSW 11/08/2017 11:48 AM

## 2017-11-08 NOTE — Progress Notes (Signed)
Pt is currently in shower. Will cont to monitor pt.

## 2017-11-08 NOTE — Plan of Care (Signed)
Patient verbalizes understanding of the general information that has been provided to her and has not voiced any further questions/concerns at this time. Patient denies SI/HI/AVH at this time stating "I want to go to heaven". Patient has been observed interacting well with staff and other members on the unit without any issues or concerns at this time. Patient went outside to the courtyard and reported to this writer that she enjoyed the fresh air. Patient stated that she slept "not too bad" last night. Patient has the ability to demonstrate self-control as well as identify the available resources that can assist her in meeting her health-related needs. Patient has been in compliance with her prescribed therapeutic/medication regimen without any further questions/concerns at this time. Patient has been functioning at an adequate level and has been working to maintain her clinical measurements within normal limits. Patient has been free from injury thus far and remains safe on the unit at this time.  Problem: Education: Goal: Knowledge of [facility] Education information/materials will improve Outcome: Progressing Goal: Emotional status will improve Outcome: Progressing Goal: Mental status will improve Outcome: Progressing Goal: Verbalization of understanding the information provided will improve Outcome: Progressing   Problem: Activity: Goal: Interest or engagement in activities will improve Outcome: Progressing Goal: Sleeping patterns will improve Outcome: Progressing   Problem: Coping: Goal: Ability to verbalize frustrations and anger appropriately will improve Outcome: Progressing Goal: Ability to demonstrate self-control will improve Outcome: Progressing   Problem: Health Behavior/Discharge Planning: Goal: Identification of resources available to assist in meeting health care needs will improve Outcome: Progressing Goal: Compliance with treatment plan for underlying cause of  condition will improve Outcome: Progressing   Problem: Safety: Goal: Periods of time without injury will increase Outcome: Progressing   Problem: Clinical Measurements: Goal: Ability to maintain clinical measurements within normal limits will improve Outcome: Progressing   Problem: Spiritual Needs Goal: Ability to function at adequate level Outcome: Progressing

## 2017-11-08 NOTE — Progress Notes (Signed)
Patient ID: Gina [name], female   DOB: [birth year], 66 y.o.   MRN: [id number]   Per State regulations 482.30 this chart was reviewed for medical necessity with respect to the patient's admission/duration of stay.    Next review date: 11/12/17  Debarah Crape, BSN, RN-BC  Case Manager

## 2017-11-08 NOTE — Progress Notes (Signed)
cpap set up and ready to use. Pt fitted with medium FFM. Unable to plug into electrical outlet due to a safety mechanism. Discussed with unit staff & they will handle resolution.

## 2017-11-09 LAB — GLUCOSE, CAPILLARY: GLUCOSE-CAPILLARY: 97 mg/dL (ref 65–99)

## 2017-11-09 LAB — AMMONIA: AMMONIA: 36 umol/L — AB (ref 9–35)

## 2017-11-09 LAB — VALPROIC ACID LEVEL: Valproic Acid Lvl: 92 ug/mL (ref 50.0–100.0)

## 2017-11-09 MED ORDER — QUETIAPINE FUMARATE 300 MG PO TABS: 300.0000 mg | ORAL_TABLET | Freq: Every day | ORAL | 1 refills | Status: DC

## 2017-11-09 MED ORDER — SENNA 8.6 MG PO TABS: 1.0000 | ORAL_TABLET | Freq: Every evening | ORAL | 0 refills | Status: AC | PRN

## 2017-11-09 MED ORDER — ETODOLAC 200 MG PO CAPS: 200.0000 mg | ORAL_CAPSULE | Freq: Two times a day (BID) | ORAL | 3 refills | Status: DC

## 2017-11-09 MED ORDER — DOCUSATE SODIUM 100 MG PO CAPS: 100.0000 mg | ORAL_CAPSULE | Freq: Two times a day (BID) | ORAL | 1 refills | Status: DC

## 2017-11-09 NOTE — Evaluation (Signed)
Physical Therapy Evaluation Patient Details Name: Gina [name] MRN: [id number] DOB: [birth year] Today's Date: 11/09/2017   History of Present Illness  Gina [name] is a 67yo white female who comes to [facility] d/t demonic hallucinations. PT is consulted d/t complaint of Left knee pain.   Clinical Impression  Pt reports chronic Left knee pain related to knee OA, no acute exacerbation. Pt reports significant chronic persistent pain. Pt reports she has not see PT nor orthopedics since living in [place] >6 years ago. At that time she reports that the orthopedist had recommended surgical intervention which she declined. She also reports that PT was unsuccessful in helping her. Examination reveals knee joint limitations in the sagittal plane (Left P/ROM flexion: 11-110 degrees), significant joint effusion with bony abnormality at the patellafemoral joint, left patella, and tibiofemoral joint line which needs correlation with imaging studies in the future. Pt educated on simple starter HEP and will recommend HHPT visit as well. Heavy supervision needed for correct performance of HEP, pt reports she has baseline memory deficits which she is concerned will be limiting. Pt also reports concerns that PT will not help, and in truth, pt would benefit from orthopedic consultation given the timeline since last orthopedic FU. Patient is at baseline, all education completed, and time is given to address all questions/concerns. No additional skilled PT services needed at this venue, PT signing off. PT recommends daily ambulation ad lib or with nursing staff as needed to prevent deconditioning.      Follow Up Recommendations Home health PT    Equipment Recommendations  None recommended by PT    Recommendations for Other Services       Precautions / Restrictions Precautions Precautions: None Restrictions Weight Bearing Restrictions: No      Mobility  Bed Mobility Overal bed mobility:  Independent                Transfers Overall transfer level: Independent                  Ambulation/Gait Ambulation/Gait assistance: Independent           General Gait Details: Moderate to heavy Left sided trendelenburg with decreased stance time; walking ad lib in behavior health unit  Stairs            Wheelchair Mobility    Modified Rankin (Stroke Patients Only)       Balance Overall balance assessment: Independent                                           Pertinent Vitals/Pain Pain Assessment: 0-10 Pain Score: 10-Worst pain ever Pain Location: Left anterio tibia Pain Descriptors / Indicators: Aching Pain Intervention(s): Monitored during session;Limited activity within patient's tolerance    Home Living Family/patient expects to be discharged to:: Private residence Living Arrangements: Other relatives(sister)             Home Equipment: None      Prior Function Level of Independence: Independent               Hand Dominance        Extremity/Trunk Assessment                Communication      Cognition Arousal/Alertness: Awake/alert Behavior During Therapy: WFL for tasks assessed/performed Overall Cognitive Status: Within Functional Limits for tasks assessed  General Comments      Exercises General Exercises - Lower Extremity Short Arc Quad: AROM;10 reps;Left;Supine Heel Slides: AROM;Left;10 reps;Supine Straight Leg Raises: AROM;Supine;Left;10 reps   Assessment/Plan    PT Assessment All further PT needs can be met in the next venue of care  PT Problem List Decreased mobility;Decreased activity tolerance;Obesity;Pain       PT Treatment Interventions      PT Goals (Current goals can be found in the Care Plan section)  Acute Rehab PT Goals Patient Stated Goal: decrease knee pain  PT Goal Formulation: All assessment and education  complete, DC therapy    Frequency     Barriers to discharge        Co-evaluation               AM-PAC PT "6 Clicks" Daily Activity  Outcome Measure Difficulty turning over in bed (including adjusting bedclothes, sheets and blankets)?: A Little Difficulty moving from lying on back to sitting on the side of the bed? : A Little Difficulty sitting down on and standing up from a chair with arms (e.g., wheelchair, bedside commode, etc,.)?: A Little Help needed moving to and from a bed to chair (including a wheelchair)?: A Little Help needed walking in hospital room?: A Little Help needed climbing 3-5 steps with a railing? : A Little 6 Click Score: 18    End of Session   Activity Tolerance: Patient tolerated treatment well Patient left: in bed   PT Visit Diagnosis: Difficulty in walking, not elsewhere classified (R26.2)    Time: 2248-2500 PT Time Calculation (min) (ACUTE ONLY): 17 min   Charges:         PT G Codes:        11:51 AM, Dec 08, 2017 Etta Grandchild, PT, DPT Physical Therapist - [facility]  [phone] ([place])     Claudius Mich C 08-Dec-2017, 11:45 AM

## 2017-11-09 NOTE — Progress Notes (Signed)
Patient was in bed upon the beginning of this shift. Did not attend evening activities, did not want to get out of bed. Was receptive during assessment. Denied thoughts of self harm. Denied hallucinations. Reported that she did not feel like attending group and other activities. Medications were taken to her in room.  Patient did not want a snack. Patient was anxious but pleasant. Did not have any concerns to report. Was encouraged to call staff as needed. Safety precautions maintained.

## 2017-11-09 NOTE — Plan of Care (Signed)
Pt stayed in bed but took medications. Did not attend evening activities

## 2017-11-09 NOTE — [facility] Suicide Risk Assessment (Signed)
[facility] Discharge Suicide Risk Assessment   Principal Problem: Bipolar I disorder, current or most recent episode manic, with psychotic features [facility]) Discharge Diagnoses:  Patient Active Problem List   Diagnosis Date Noted  . Bipolar I disorder, current or most recent episode manic, with psychotic features ([place]) [F31.2] 11/04/2017    Priority: High  . Gallbladder polyp [K82.4] 10/21/2017  . Left shoulder pain [M25.512] 10/21/2017  . Benign neoplasm of ascending colon [D12.2]   . Bipolar affective disorder, current episode hypomanic ([place]) [F31.0] 04/08/2017  . Low back pain radiating to left lower extremity [M54.5, M79.605] 02/26/2017  . Acquired hypothyroidism [E03.9] 09/10/2016  . Thrombocytopenia ([place]) [D69.6] 06/15/2016  . Medicare annual wellness visit, subsequent [Z00.00] 06/04/2016  . Advanced care planning/counseling discussion [Z71.89] 06/04/2016  . External hemorrhoid [K64.4] 04/02/2016  . Slow transit constipation [K59.01] 01/04/2016  . Bipolar I disorder, most recent episode (or current) manic ([place]) [F31.10] 12/03/2015  . NAFLD (nonalcoholic fatty liver disease) [K76.0] 03/28/2015  . Gallbladder calculus without cholecystitis [K80.20] 03/28/2015  . Gout [M10.9] 03/27/2015  . HCV antibody positive [R76.8] 03/27/2015  . History of bladder cancer [Z85.51] 03/27/2015  . History of bundle branch block [Z86.79] 03/27/2015  . Genetic testing [Z13.79] 02/28/2015  . Loss of memory [R41.3] 09/04/2014  . Controlled type 2 diabetes mellitus with diabetic nephropathy ([place]) [E11.21] 06/07/2014  . Hyperlipidemia [E78.5]   . Anemia in CKD (chronic kidney disease) [N18.9, D63.1] 01/26/2013  . Personal history of colonic adenomas and colon cancer [Z86.010] 11/11/2012  . Osteoarthritis of knee [M17.10] 10/02/2012  . OSA on CPAP [G47.33, Z99.89]   . CKD (chronic kidney disease) stage 3, GFR 30-59 ml/min (H[facility]) [N18.3]   . Severe obesity (BMI 35.0-39.9) with comorbidity ([place]) [E66.01]   . HTN  (hypertension), benign [I10] 04/07/2012  . Hx of colon cancer, stage I [Z85.038] 08/05/2011    Total Time spent with patient: 15 minutes plus 20 min on care coordination and documentation  Musculoskeletal: Strength & Muscle Tone: decreased Gait & Station: broad based Patient leans: N/A  Psychiatric Specialty Exam: Review of Systems  Musculoskeletal: Positive for joint pain.  Neurological: Negative.   Psychiatric/Behavioral: Negative.   All other systems reviewed and are negative.   Blood pressure 121/69, pulse 74, temperature 97.8 F (36.6 C), temperature source Oral, resp. rate 16, height 5\' 2"  (1.575 m), weight 89.8 kg (198 lb), SpO2 98 %.Body mass index is 36.21 kg/m.  General Appearance: Casual  Eye Contact::  Good  Speech:  Clear and Coherent409  Volume:  Normal  Mood:  Euthymic  Affect:  Appropriate  Thought Process:  Goal Directed and Descriptions of Associations: Intact  Orientation:  Full (Time, Place, and Person)  Thought Content:  WDL  Suicidal Thoughts:  No  Homicidal Thoughts:  No  Memory:  Immediate;   Fair Recent;   Fair Remote;   Fair  Judgement:  Impaired  Insight:  Present  Psychomotor Activity:  Normal  Concentration:  Fair  Recall:  Poor  Fund of Knowledge:Fair  Language: Fair  Akathisia:  No  Handed:  Right  AIMS (if indicated):     Assets:  Communication Skills Desire for Improvement Financial Resources/Insurance Housing Resilience Social Support  Sleep:  Number of Hours: 7.45  Cognition: WNL  ADL's:  Intact   Mental Status Per Nursing Assessment::   On Admission:  NA  Demographic Factors:  Divorced or widowed, Caucasian and Low socioeconomic status  Loss Factors: NA  Historical Factors: Impulsivity  Risk Reduction Factors:  Sense of responsibility to family, Living with another person, especially a relative, Positive social support and Positive therapeutic relationship  Continued Clinical Symptoms:  Bipolar Disorder:    Mixed State  Cognitive Features That Contribute To Risk:  None    Suicide Risk:  Minimal: No identifiable suicidal ideation.  Patients presenting with no risk factors but with morbid ruminations; may be classified as minimal risk based on the severity of the depressive symptoms  Follow-up Information    Services, Psychotherapeutic. Go on 11/09/2017.   Why:  Your ACT Team will pick you up upon discharge from [facility], and your ACTT services with [facility] will resume immediately. Thank you! Contact information: [address] [place] [address] [phone]           Plan Of Care/Follow-up recommendations:  Activity:  as tolerated Diet:  low sodium heart healthy ADA diet Other:  keep follow up appointments  Orson Slick, MD 11/09/2017, 12:21 PM

## 2017-11-09 NOTE — Progress Notes (Signed)
Recreation Therapy Notes  INPATIENT RECREATION TR PLAN  Patient Details Name: Gina [name] MRN: [id number] DOB: 1950/09/01 Today's Date: 11/09/2017  Rec Therapy Plan Is patient appropriate for Therapeutic Recreation?: Yes Treatment times per week: at least 3 Estimated Length of Stay: 5-7 days TR Treatment/Interventions: Group participation (Comment)  Discharge Criteria Pt will be discharged from therapy if:: Discharged Treatment plan/goals/alternatives discussed and agreed upon by:: Patient/family  Discharge Summary Short term goals set:  Patient will engage in groups without prompting or encouragement from LRT x3 group sessions within 5 recreation therapy group sessions Short term goals met: Complete Progress toward goals comments: Groups attended Which groups?: Leisure education, Other (Comment)(Team work ,Loss adjuster, [employer]) Reason goals not met: N/A Therapeutic equipment acquired: N/A Reason patient discharged from therapy: Discharge from hospital Pt/family agrees with progress & goals achieved: Yes Date patient discharged from therapy: 11/09/17   Dandrea Widdowson 11/09/2017, 1:02 PM
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
## 2017-11-09 NOTE — Progress Notes (Signed)
Recreation Therapy Notes  Date: 11/09/2017  Time: 9:30 am  Location: Craft Room  Behavioral response: Appropriate  Intervention Topic: Creative Expression  Discussion/Intervention: Group content on today was focused on creative expressions. The group defined creative expressions and ways they use creative expressions. Individual identified other positive ways creative expressions can be used and why it is important to express yourself. Patients participated in the intervention "expressive painting", where they had a chance to creatively express themselves.  Clinical Observations/Feedback:  Patient came to group and described prayer as a form of creative expression. She stated she participates in word searches to express herself. Individual participated in the intervention and was social with peers and staff during group. Pleshette Tomasini LRT/CTRS         Seham Gardenhire 11/09/2017 12:05 PM

## 2017-11-09 NOTE — Progress Notes (Signed)
Patient denies SI/HI, denies A/V hallucinations. Patient verbalizes understanding of discharge instructions, follow up care and prescriptions. Patient given all belongings from  [place].Home medicines returned from pharmacy. Patient escorted out by staff, transported by ACT team.

## 2017-11-09 NOTE — Progress Notes (Signed)
  [facility] Adult Case Management Discharge Plan :  Will you be returning to the same living situation after discharge:  Yes,  returning home. At discharge, do you have transportation home?: Yes,  ACT Team . Do you have the ability to pay for your medications: Yes,  ACTT  Release of information consent forms completed and in the chart;  Patient's signature needed at discharge.  Patient to Follow up at: Follow-up Information    Services, Psychotherapeutic. Go on 11/09/2017.   Why:  Your ACT Team will pick you up upon discharge from BMU, and your ACTT services with [facility] will resume immediately. Thank you! Contact information: [address] [place] [address] [phone]           Next level of care provider has access to [place] and Suicide Prevention discussed: Yes,  with pt and her sister.  Have you used any form of tobacco in the last 30 days? (Cigarettes, Smokeless Tobacco, Cigars, and/or Pipes): No  Has patient been referred to the Quitline?: N/A patient is not a smoker  Patient has been referred for addiction treatment: N/A  Alden Hipp, LCSW 11/09/2017, 9:16 AM

## 2017-11-13 ENCOUNTER — Other Ambulatory Visit: Payer: Self-pay

## 2017-11-13 ENCOUNTER — Encounter: Payer: Self-pay | Admitting: Emergency Medicine

## 2017-11-13 ENCOUNTER — Inpatient Hospital Stay
Admission: AD | Admit: 2017-11-13 | Discharge: 2017-11-24 | DRG: 885 | Disposition: A | Discharge status: Home / Self Care | Payer: Medicare Other | Source: Intra-hospital | Attending: Psychiatry | Admitting: Psychiatry

## 2017-11-13 ENCOUNTER — Encounter: Payer: Self-pay | Admitting: Psychiatry

## 2017-11-13 ENCOUNTER — Emergency Department
Admission: EM | Admit: 2017-11-13 | Discharge: 2017-11-13 | Disposition: A | Discharge status: Other Acute Inpatient Hospital | Payer: Medicare Other | Attending: Emergency Medicine | Admitting: Emergency Medicine

## 2017-11-13 ENCOUNTER — Ambulatory Visit: Payer: Self-pay | Admitting: *Deleted

## 2017-11-13 DIAGNOSIS — K5900 Constipation, unspecified: Secondary | ICD-10-CM | POA: Diagnosis present

## 2017-11-13 DIAGNOSIS — R413 Other amnesia: Secondary | ICD-10-CM | POA: Diagnosis present

## 2017-11-13 DIAGNOSIS — F309 Manic episode, unspecified: Secondary | ICD-10-CM | POA: Diagnosis not present

## 2017-11-13 DIAGNOSIS — M179 Osteoarthritis of knee, unspecified: Secondary | ICD-10-CM | POA: Diagnosis present

## 2017-11-13 DIAGNOSIS — Z85038 Personal history of other malignant neoplasm of large intestine: Secondary | ICD-10-CM | POA: Diagnosis not present

## 2017-11-13 DIAGNOSIS — N183 Chronic kidney disease, stage 3 (moderate): Secondary | ICD-10-CM | POA: Diagnosis present

## 2017-11-13 DIAGNOSIS — E039 Hypothyroidism, unspecified: Secondary | ICD-10-CM | POA: Diagnosis present

## 2017-11-13 DIAGNOSIS — Z8542 Personal history of malignant neoplasm of other parts of uterus: Secondary | ICD-10-CM | POA: Diagnosis not present

## 2017-11-13 DIAGNOSIS — Z7984 Long term (current) use of oral hypoglycemic drugs: Secondary | ICD-10-CM | POA: Diagnosis not present

## 2017-11-13 DIAGNOSIS — K469 Unspecified abdominal hernia without obstruction or gangrene: Secondary | ICD-10-CM | POA: Diagnosis present

## 2017-11-13 DIAGNOSIS — F3174 Bipolar disorder, in full remission, most recent episode manic: Secondary | ICD-10-CM | POA: Diagnosis not present

## 2017-11-13 DIAGNOSIS — Z79899 Other long term (current) drug therapy: Secondary | ICD-10-CM | POA: Insufficient documentation

## 2017-11-13 DIAGNOSIS — I10 Essential (primary) hypertension: Secondary | ICD-10-CM | POA: Diagnosis present

## 2017-11-13 DIAGNOSIS — Z8551 Personal history of malignant neoplasm of bladder: Secondary | ICD-10-CM | POA: Diagnosis not present

## 2017-11-13 DIAGNOSIS — F3164 Bipolar disorder, current episode mixed, severe, with psychotic features: Secondary | ICD-10-CM | POA: Diagnosis not present

## 2017-11-13 DIAGNOSIS — K219 Gastro-esophageal reflux disease without esophagitis: Secondary | ICD-10-CM | POA: Diagnosis present

## 2017-11-13 DIAGNOSIS — E1122 Type 2 diabetes mellitus with diabetic chronic kidney disease: Secondary | ICD-10-CM | POA: Insufficient documentation

## 2017-11-13 DIAGNOSIS — I129 Hypertensive chronic kidney disease with stage 1 through stage 4 chronic kidney disease, or unspecified chronic kidney disease: Secondary | ICD-10-CM | POA: Diagnosis present

## 2017-11-13 DIAGNOSIS — Z9049 Acquired absence of other specified parts of digestive tract: Secondary | ICD-10-CM | POA: Diagnosis not present

## 2017-11-13 DIAGNOSIS — E1121 Type 2 diabetes mellitus with diabetic nephropathy: Secondary | ICD-10-CM | POA: Diagnosis present

## 2017-11-13 DIAGNOSIS — Z800 Family history of malignant neoplasm of digestive organs: Secondary | ICD-10-CM | POA: Diagnosis not present

## 2017-11-13 DIAGNOSIS — E785 Hyperlipidemia, unspecified: Secondary | ICD-10-CM | POA: Diagnosis present

## 2017-11-13 DIAGNOSIS — G4733 Obstructive sleep apnea (adult) (pediatric): Secondary | ICD-10-CM | POA: Diagnosis present

## 2017-11-13 DIAGNOSIS — M1711 Unilateral primary osteoarthritis, right knee: Secondary | ICD-10-CM | POA: Diagnosis present

## 2017-11-13 DIAGNOSIS — G4700 Insomnia, unspecified: Secondary | ICD-10-CM | POA: Diagnosis present

## 2017-11-13 DIAGNOSIS — Z90721 Acquired absence of ovaries, unilateral: Secondary | ICD-10-CM

## 2017-11-13 DIAGNOSIS — Z90711 Acquired absence of uterus with remaining cervical stump: Secondary | ICD-10-CM | POA: Diagnosis not present

## 2017-11-13 DIAGNOSIS — F310 Bipolar disorder, current episode hypomanic: Secondary | ICD-10-CM | POA: Diagnosis not present

## 2017-11-13 DIAGNOSIS — Z833 Family history of diabetes mellitus: Secondary | ICD-10-CM | POA: Diagnosis not present

## 2017-11-13 DIAGNOSIS — M1710 Unilateral primary osteoarthritis, unspecified knee: Secondary | ICD-10-CM | POA: Diagnosis present

## 2017-11-13 DIAGNOSIS — Z5181 Encounter for therapeutic drug level monitoring: Secondary | ICD-10-CM | POA: Diagnosis not present

## 2017-11-13 DIAGNOSIS — Z818 Family history of other mental and behavioral disorders: Secondary | ICD-10-CM | POA: Diagnosis not present

## 2017-11-13 DIAGNOSIS — Z79890 Hormone replacement therapy (postmenopausal): Secondary | ICD-10-CM

## 2017-11-13 DIAGNOSIS — K760 Fatty (change of) liver, not elsewhere classified: Secondary | ICD-10-CM | POA: Diagnosis present

## 2017-11-13 LAB — COMPREHENSIVE METABOLIC PANEL
ALT: 26 U/L (ref 14–54)
AST: 28 U/L (ref 15–41)
Albumin: 4.4 g/dL (ref 3.5–5.0)
Alkaline Phosphatase: 71 U/L (ref 38–126)
Anion gap: 8 (ref 5–15)
BUN: 37 mg/dL — AB (ref 6–20)
CHLORIDE: 103 mmol/L (ref 101–111)
CO2: 27 mmol/L (ref 22–32)
Calcium: 9.3 mg/dL (ref 8.9–10.3)
Creatinine, Ser: 1.95 mg/dL — ABNORMAL HIGH (ref 0.44–1.00)
GFR calc Af Amer: 29 mL/min — ABNORMAL LOW (ref >60)
GFR, EST NON AFRICAN AMERICAN: 25 mL/min — AB (ref >60)
Glucose, Bld: 71 mg/dL (ref 65–99)
Potassium: 5.7 mmol/L — ABNORMAL HIGH (ref 3.5–5.1)
Sodium: 138 mmol/L (ref 135–145)
Total Bilirubin: 0.6 mg/dL (ref 0.3–1.2)
Total Protein: 7.6 g/dL (ref 6.5–8.1)

## 2017-11-13 LAB — ETHANOL

## 2017-11-13 LAB — URINE DRUG SCREEN, QUALITATIVE (ARMC ONLY)
Amphetamines, Ur Screen: NOT DETECTED
Barbiturates, Ur Screen: NOT DETECTED
Benzodiazepine, Ur Scrn: NOT DETECTED
CANNABINOID 50 NG, UR ~~LOC~~: NOT DETECTED
COCAINE METABOLITE, UR ~~LOC~~: NOT DETECTED
MDMA (ECSTASY) UR SCREEN: NOT DETECTED
Methadone Scn, Ur: NOT DETECTED
Opiate, Ur Screen: NOT DETECTED
PHENCYCLIDINE (PCP) UR S: NOT DETECTED
Tricyclic, Ur Screen: NOT DETECTED

## 2017-11-13 LAB — GLUCOSE, CAPILLARY: Glucose-Capillary: 125 mg/dL — ABNORMAL HIGH (ref 65–99)

## 2017-11-13 LAB — CBC
HCT: 35.1 % (ref 35.0–47.0)
HEMOGLOBIN: 12.4 g/dL (ref 12.0–16.0)
MCH: 33.9 pg (ref 26.0–34.0)
MCHC: 35.3 g/dL (ref 32.0–36.0)
MCV: 96.2 fL (ref 80.0–100.0)
Platelets: 123 10*3/uL — ABNORMAL LOW (ref 150–440)
RBC: 3.64 MIL/uL — AB (ref 3.80–5.20)
RDW: 13.5 % (ref 11.5–14.5)
WBC: 6.4 10*3/uL (ref 3.6–11.0)

## 2017-11-13 LAB — SALICYLATE LEVEL: Salicylate Lvl: <7 mg/dL (ref 2.8–30.0)

## 2017-11-13 LAB — ACETAMINOPHEN LEVEL: Acetaminophen (Tylenol), Serum: <10 ug/mL — ABNORMAL LOW (ref 10–30)

## 2017-11-13 MED ORDER — POLYETHYLENE GLYCOL 3350 17 GM/SCOOP PO POWD: 1.0000 | Freq: Every morning | ORAL | Status: DC

## 2017-11-13 MED ORDER — ETODOLAC 200 MG PO CAPS: 200.0000 mg | ORAL_CAPSULE | Freq: Two times a day (BID) | ORAL | Status: DC

## 2017-11-13 MED ORDER — QUETIAPINE FUMARATE 200 MG PO TABS
300.0000 mg | ORAL_TABLET | Freq: Every day | ORAL | Status: DC
  Administered 2017-11-14 – 2017-11-15 (×2): 300 mg via ORAL
  Filled 2017-11-13 (×2): qty 1

## 2017-11-13 MED ORDER — LOSARTAN POTASSIUM 50 MG PO TABS
50.0000 mg | ORAL_TABLET | Freq: Every day | ORAL | Status: DC
  Administered 2017-11-13: 50 mg via ORAL

## 2017-11-13 MED ORDER — METOPROLOL TARTRATE 25 MG PO TABS
12.5000 mg | ORAL_TABLET | Freq: Two times a day (BID) | ORAL | Status: DC
  Administered 2017-11-14 – 2017-11-24 (×20): 12.5 mg via ORAL
  Filled 2017-11-13 (×22): qty 1

## 2017-11-13 MED ORDER — PALIPERIDONE PALMITATE 234 MG/1.5ML IM SUSP: 234.0000 mg | INTRAMUSCULAR | Status: DC

## 2017-11-13 MED ORDER — LAMOTRIGINE 25 MG PO TABS: 25.0000 mg | ORAL_TABLET | Freq: Every day | ORAL | Status: DC

## 2017-11-13 MED ORDER — QUETIAPINE FUMARATE 300 MG PO TABS
300.0000 mg | ORAL_TABLET | Freq: Every day | ORAL | Status: DC
  Filled 2017-11-13: qty 1

## 2017-11-13 MED ORDER — POLYETHYLENE GLYCOL 3350 17 G PO PACK: 17.0000 g | PACK | Freq: Every day | ORAL | Status: DC

## 2017-11-13 MED ORDER — NYSTATIN 100000 UNIT/GM EX POWD
Freq: Three times a day (TID) | CUTANEOUS | Status: DC
  Administered 2017-11-13: 22:00:00 via TOPICAL
  Filled 2017-11-13: qty 15

## 2017-11-13 MED ORDER — SIMVASTATIN 10 MG PO TABS
20.0000 mg | ORAL_TABLET | Freq: Every day | ORAL | Status: DC
  Filled 2017-11-13: qty 2

## 2017-11-13 MED ORDER — MAGNESIUM HYDROXIDE 400 MG/5ML PO SUSP
30.0000 mL | Freq: Every day | ORAL | Status: DC | PRN
  Filled 2017-11-13 (×2): qty 30

## 2017-11-13 MED ORDER — SENNA 8.6 MG PO TABS: 1.0000 | ORAL_TABLET | Freq: Every evening | ORAL | Status: DC | PRN

## 2017-11-13 MED ORDER — METOPROLOL TARTRATE 25 MG PO TABS: 25.0000 mg | ORAL_TABLET | Freq: Every morning | ORAL | Status: DC

## 2017-11-13 MED ORDER — DIVALPROEX SODIUM 500 MG PO DR TAB
500.0000 mg | DELAYED_RELEASE_TABLET | Freq: Two times a day (BID) | ORAL | Status: DC
  Administered 2017-11-14 – 2017-11-24 (×21): 500 mg via ORAL
  Filled 2017-11-13 (×21): qty 1

## 2017-11-13 MED ORDER — METFORMIN HCL 500 MG PO TABS: 500.0000 mg | ORAL_TABLET | Freq: Every day | ORAL | Status: DC

## 2017-11-13 MED ORDER — ETODOLAC 200 MG PO CAPS
200.0000 mg | ORAL_CAPSULE | Freq: Two times a day (BID) | ORAL | Status: DC
  Administered 2017-11-13: 200 mg via ORAL
  Filled 2017-11-13: qty 1

## 2017-11-13 MED ORDER — HYDROXYZINE HCL 25 MG PO TABS
25.0000 mg | ORAL_TABLET | Freq: Three times a day (TID) | ORAL | Status: DC | PRN
  Administered 2017-11-21 – 2017-11-22 (×2): 25 mg via ORAL
  Filled 2017-11-13 (×2): qty 1

## 2017-11-13 MED ORDER — LOSARTAN POTASSIUM 50 MG PO TABS
50.0000 mg | ORAL_TABLET | Freq: Every day | ORAL | Status: DC
  Administered 2017-11-14 – 2017-11-24 (×11): 50 mg via ORAL
  Filled 2017-11-13 (×11): qty 1

## 2017-11-13 MED ORDER — GLUCOSE BLOOD VI STRP: 1.0000 | ORAL_STRIP | Freq: Every day | Status: DC

## 2017-11-13 MED ORDER — MELATONIN 5 MG PO TABS
2.5000 mg | ORAL_TABLET | Freq: Every day | ORAL | Status: DC
  Administered 2017-11-13: 2.5 mg via ORAL
  Filled 2017-11-13: qty 0.5

## 2017-11-13 MED ORDER — DOCUSATE SODIUM 100 MG PO CAPS
100.0000 mg | ORAL_CAPSULE | Freq: Two times a day (BID) | ORAL | Status: DC
  Administered 2017-11-14 – 2017-11-20 (×12): 100 mg via ORAL
  Filled 2017-11-13 (×13): qty 1

## 2017-11-13 MED ORDER — LEVOTHYROXINE SODIUM 50 MCG PO TABS: 50.0000 ug | ORAL_TABLET | Freq: Every day | ORAL | Status: DC

## 2017-11-13 MED ORDER — DIVALPROEX SODIUM 500 MG PO DR TAB
500.0000 mg | DELAYED_RELEASE_TABLET | Freq: Two times a day (BID) | ORAL | Status: DC
  Administered 2017-11-13: 500 mg via ORAL
  Filled 2017-11-13: qty 1

## 2017-11-13 MED ORDER — DOCUSATE SODIUM 100 MG PO CAPS
100.0000 mg | ORAL_CAPSULE | Freq: Two times a day (BID) | ORAL | Status: DC
  Filled 2017-11-13: qty 1

## 2017-11-13 MED ORDER — SIMVASTATIN 40 MG PO TABS
20.0000 mg | ORAL_TABLET | Freq: Every day | ORAL | Status: DC
  Administered 2017-11-14 – 2017-11-23 (×10): 20 mg via ORAL
  Filled 2017-11-13 (×9): qty 1

## 2017-11-13 MED ORDER — LOSARTAN POTASSIUM 50 MG PO TABS
50.0000 mg | ORAL_TABLET | Freq: Every day | ORAL | Status: DC
  Administered 2017-11-13: 50 mg via ORAL
  Filled 2017-11-13 (×2): qty 1

## 2017-11-13 MED ORDER — METOPROLOL TARTRATE 25 MG PO TABS
12.5000 mg | ORAL_TABLET | Freq: Two times a day (BID) | ORAL | Status: DC
  Administered 2017-11-13: 12.5 mg via ORAL
  Filled 2017-11-13: qty 1

## 2017-11-13 MED ORDER — LAMOTRIGINE 25 MG PO TABS
25.0000 mg | ORAL_TABLET | Freq: Every day | ORAL | Status: DC
  Administered 2017-11-14 – 2017-11-15 (×2): 25 mg via ORAL
  Filled 2017-11-13 (×3): qty 1

## 2017-11-13 MED ORDER — ACETAMINOPHEN 325 MG PO TABS
650.0000 mg | ORAL_TABLET | Freq: Four times a day (QID) | ORAL | Status: DC | PRN
  Administered 2017-11-14 – 2017-11-17 (×3): 650 mg via ORAL
  Filled 2017-11-13 (×3): qty 2

## 2017-11-13 MED ORDER — LAMOTRIGINE 25 MG PO TABS
25.0000 mg | ORAL_TABLET | Freq: Every day | ORAL | Status: DC
  Administered 2017-11-13: 25 mg via ORAL
  Filled 2017-11-13 (×2): qty 1

## 2017-11-13 MED ORDER — ALUM & MAG HYDROXIDE-SIMETH 200-200-20 MG/5ML PO SUSP
30.0000 mL | ORAL | Status: DC | PRN
  Administered 2017-11-22 – 2017-11-23 (×3): 30 mL via ORAL
  Filled 2017-11-13 (×3): qty 30

## 2017-11-13 MED ORDER — DIVALPROEX SODIUM 500 MG PO DR TAB: 500.0000 mg | DELAYED_RELEASE_TABLET | Freq: Two times a day (BID) | ORAL | Status: DC

## 2017-11-13 MED ORDER — DOCUSATE SODIUM 100 MG PO CAPS
100.0000 mg | ORAL_CAPSULE | Freq: Two times a day (BID) | ORAL | Status: DC
  Administered 2017-11-13: 100 mg via ORAL
  Filled 2017-11-13 (×2): qty 1

## 2017-11-13 MED ORDER — ETODOLAC 200 MG PO CAPS
200.0000 mg | ORAL_CAPSULE | Freq: Two times a day (BID) | ORAL | Status: DC
  Administered 2017-11-14 – 2017-11-17 (×7): 200 mg via ORAL
  Filled 2017-11-13 (×8): qty 1

## 2017-11-13 MED ORDER — TRAZODONE HCL 100 MG PO TABS: 100.0000 mg | ORAL_TABLET | Freq: Every evening | ORAL | Status: DC | PRN

## 2017-11-13 MED ORDER — SIMVASTATIN 10 MG PO TABS: 20.0000 mg | ORAL_TABLET | Freq: Every day | ORAL | Status: DC

## 2017-11-13 NOTE — Progress Notes (Signed)
Patient ID: Gina [name], female   DOB: 1950/08/09, 67 y.o.   MRN: [id number] Patient presents voluntarily with chief complaint of depression and anxiety. Was recently discharged from this facility. Reports that she has been feeling overwhelmed and depressed. Frequently feeling helpless. Currently denying thoughts of self harm but admits that she does not like the life she is living. Reports that she was recently discharged before she was ready, the reason why she is back. Stressors include missing her parents who are deceased. Patient also complains of left knee pain and she is being medicated. Comprehensive admission assessment completed. Skin assessment performed by this writer assisted by RN Ubaldo Glassing V. No skin problems found. Pt was admitted and reoriented to the unit. Safety precautions initiated. Will be evaluated by MD in AM.

## 2017-11-13 NOTE — ED Triage Notes (Signed)
Pt comes into the ED via POV with her ACT team c/o increased mania and disorganized thought process.  Patient is here voluntarily and was just released from the facility on Monday.  Patient denies any SI or HI.

## 2017-11-13 NOTE — BH Assessment (Deleted)
Patient is to be admitted to [facility] by Dr. Weber Cooks.  Attending Physician will be Dr. Wonda Olds.   Patient has been assigned to room 307, by [facility] Charge Nurse T'Yawn.   Intake Paper Work has been signed and placed on patient chart.  ER staff is aware of the admission:  Raquel Sarna, ER Dian Situ   Dr. Rip Harbour, ER MD   Junita Push, Patient's Nurse   [facility] Access.

## 2017-11-13 NOTE — ED Notes (Signed)
PT VOLUNTARY PENDING PLACEMENT. 
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
## 2017-11-13 NOTE — Tx Team (Signed)
Initial Treatment Plan 11/13/2017 11:11 PM Gina [name] IYM:[id number]    PATIENT STRESSORS: Loss of parents Other: support   PATIENT STRENGTHS: Ability for insight Communication skills Motivation for treatment/growth Religious Affiliation   PATIENT IDENTIFIED PROBLEMS: Anxiety  Dapression                   DISCHARGE CRITERIA:  Ability to meet basic life and health needs Improved stabilization in mood, thinking, and/or behavior Motivation to continue treatment in a less acute level of care  PRELIMINARY DISCHARGE PLAN: Outpatient therapy Participate in family therapy Return to previous living arrangement  PATIENT/FAMILY INVOLVEMENT: This treatment plan has been presented to and reviewed with the patient, Gina [name],**.  The patient  Has  been given the opportunity to ask questions and make suggestions.  [name], RN 11/13/2017, 11:11 PM

## 2017-11-13 NOTE — Consult Note (Signed)
[place] [facility] Consult   Reason for Consult: Consult for 66 year old woman with a history of bipolar disorder who presented voluntarily to the emergency room requesting admission Referring Physician: Rip Harbour Patient Identification: Gina [name] MRN:  [id number] Principal Diagnosis: Bipolar affective disorder, current episode hypomanic [facility]) Diagnosis:   Patient Active Problem List   Diagnosis Date Noted  . Bipolar I disorder, current or most recent episode manic, with psychotic features ([place]) [F31.2] 11/04/2017  . Gallbladder polyp [K82.4] 10/21/2017  . Left shoulder pain [M25.512] 10/21/2017  . Benign neoplasm of ascending colon [D12.2]   . Bipolar affective disorder, current episode hypomanic ([place]) [F31.0] 04/08/2017  . Low back pain radiating to left lower extremity [M54.5, M79.605] 02/26/2017  . Acquired hypothyroidism [E03.9] 09/10/2016  . Thrombocytopenia ([place]) [D69.6] 06/15/2016  . Medicare annual wellness visit, subsequent [Z00.00] 06/04/2016  . Advanced care planning/counseling discussion [Z71.89] 06/04/2016  . External hemorrhoid [K64.4] 04/02/2016  . Slow transit constipation [K59.01] 01/04/2016  . Bipolar I disorder, most recent episode (or current) manic ([place]) [F31.10] 12/03/2015  . NAFLD (nonalcoholic fatty liver disease) [K76.0] 03/28/2015  . Gallbladder calculus without cholecystitis [K80.20] 03/28/2015  . Gout [M10.9] 03/27/2015  . HCV antibody positive [R76.8] 03/27/2015  . History of bladder cancer [Z85.51] 03/27/2015  . History of bundle branch block [Z86.79] 03/27/2015  . Genetic testing [Z13.79] 02/28/2015  . Loss of memory [R41.3] 09/04/2014  . Controlled type 2 diabetes mellitus with diabetic nephropathy ([place]) [E11.21] 06/07/2014  . Hyperlipidemia [E78.5]   . Anemia in CKD (chronic kidney disease) [N18.9, D63.1] 01/26/2013  . Personal history of colonic adenomas and colon cancer [Z86.010] 11/11/2012  . Osteoarthritis of knee [M17.10]  10/02/2012  . OSA on CPAP [G47.33, Z99.89]   . CKD (chronic kidney disease) stage 3, GFR 30-59 ml/min (HCC) [N18.3]   . Severe obesity (BMI 35.0-39.9) with comorbidity ([place]) [E66.01]   . HTN (hypertension), benign [I10] 04/07/2012  . Hx of colon cancer, stage I [Z85.038] 08/05/2011    Total Time spent with patient: 1 hour  Subjective:   Gina [name] is a 68 y.o. female patient admitted with "I have been laughing and crying".  HPI: Patient interviewed chart reviewed.  67 year old woman with a history of bipolar disorder.  Just discharged from the hospital a few days ago.  Comes back voluntarily saying that over the last 2 days her mood has been much worse.  She says she has been "laughing and crying and out of control".  Trouble sleeping at night.  Back to having hallucinations.  He denies any suicidal thoughts or wish to die.  He says that he has been compliant with her medicine.  Denies any alcohol or drug abuse.  Denies any specific new stresses.  Social history: Lives with her sister.  The 2 of them have some significant social barriers.  Neither can drive.  Medical history: Hypertension diabetes hypothyroidism dyslipidemia  Substance abuse history: None  Past Psychiatric History: Patient has a long history of bipolar disorder.  She was in the hospital for manic symptoms last week and was just discharged about 4 days ago.  Patient says that she thinks she was discharged too soon.  She does not have a history of suicide attempts or violence.  Does have a history of hallucinations agitation and mood lability.  Risk to Self: Is patient at risk for suicide?: No Risk to Others:   Prior Inpatient Therapy:   Prior Outpatient Therapy:    Past Medical History:  Past Medical History:  Diagnosis Date  . Anemia in chronic kidney disease   . Bipolar depression [facility])    sees psychiatrist - psych admission 03/2015  . Bladder cancer ([place]) 1990's  . Blood transfusion without reported  diagnosis 2009  . Cataract    left  . CKD (chronic kidney disease) stage 3, GFR 30-59 ml/min (HCC)   . Colon cancer ([place]) 1990's  . DJD (degenerative joint disease), lumbar    chronic lower back pain  . Enterocutaneous fistula 04/07/2012   completed PT 06/2012 ([facility])  . Family history of breast cancer   . Family history of colon cancer   . Family history of stomach cancer   . GERD (gastroesophageal reflux disease)   . History of bladder cancer 1997  . History of colon cancer    s/p surgery  . History of uterine cancer    s/p hysterectomy  . Hyperlipidemia   . Hypertension   . IDA (iron deficiency anemia) 01/2013   thought due to h/o polyps  . Obesity (BMI 30-39.9)   . OSA on CPAP    6cm H2O  . Personal history of colonic adenomas and colon cancer 11/11/2012  . Positive hepatitis C antibody test 09/2014   but negative confirmatory testing  . RBBB   . Uncontrolled type 2 diabetes mellitus with nephropathy ([place]) 06/07/2014   completed DSME 02/2016    Past Surgical History:  Procedure Laterality Date  . BREAST BIOPSY Right 01/2014   fibroadenoma  . COLONOSCOPY  07/2009  . COLONOSCOPY  11/2012   2 tubular adenomas, mild diverticulosis, pending genetic testing for Lynch syndrome [name]) rpt 2 yrs  . COLONOSCOPY  02/2015   TA, diverticulosis, rpt 2 yrs [name])  . COLONOSCOPY WITH PROPOFOL N/A 06/09/2017   TAx1, rpt 2 yrs [name], [name], MD)  . DEXA  12/2009   WNL  . DOBUTAMINE STRESS ECHO  12/2009   no evidence of ischemia  . HERNIA REPAIR  02/04/12  . i & d abdominal wound  02/19/12  . LEFT OOPHORECTOMY  2005  . PARTIAL COLECTOMY  about 2008   for colon cancer  . PARTIAL HYSTERECTOMY  1981   uterine cancer, R ovary remains  . Reexploration of abdominal wound and Allograft placemet  02/26/12  . Removal of infected mesh and abdominal wound vac placement  02/24/12  . sleep study  02/2014   OSA - AHI 55, nadir 81% [name])   Family History:  Family History  Problem Relation  Age of Onset  . Colon cancer Mother 68  . Stomach cancer Mother        dx in her 28s?  . CAD Father        MI; deceased 69  . Mental illness Sister        anxiety/depression  . Thyroid disease Sister   . Breast cancer Maternal Grandmother        age at dx unknown  . Diabetes Brother   . Diabetes Brother   . Diabetes Other        aunts and uncles both sides  . Arthritis Other        strong fmhx  . Colon cancer Other 53       maternal half-sister; deceased   Family Psychiatric  History: Positive for bipolar Social History:  Social History   Substance and Sexual Activity  Alcohol Use No  . Alcohol/week: 0.0 oz     Social History   Substance and Sexual Activity  Drug Use No  Social History   Socioeconomic History  . Marital status: Single    Spouse name: Not on file  . Number of children: 0  . Years of education: Not on file  . Highest education level: Not on file  Occupational History  . Not on file  Social Needs  . Financial resource strain: Not on file  . Food insecurity:    Worry: Not on file    Inability: Not on file  . Transportation needs:    Medical: Not on file    Non-medical: Not on file  Tobacco Use  . Smoking status: Never Smoker  . Smokeless tobacco: Never Used  Substance and Sexual Activity  . Alcohol use: No    Alcohol/week: 0.0 oz  . Drug use: No  . Sexual activity: Never  Lifestyle  . Physical activity:    Days per week: Not on file    Minutes per session: Not on file  . Stress: Not on file  Relationships  . Social connections:    Talks on phone: Not on file    Gets together: Not on file    Attends religious service: Not on file    Active member of club or organization: Not on file    Attends meetings of clubs or organizations: Not on file    Relationship status: Not on file  Other Topics Concern  . Not on file  Social History Narrative   Lives with sister, no pets   Occupation: disabled, for bipolar and arthritis   Edu: GED    Activity: take walks   Diet: good water, vegetables daily   Religion: [place], Dr. Jimmye Norman (ph [phone])   Additional Social History:    Allergies:   Allergies  Allergen Reactions  . Risperidone And Related Other (See Comments)    Reaction:  Altered mental status   . Penicillins Other (See Comments)    Pt states that this med knocks her out.  Has patient had a PCN reaction causing immediate rash, facial/tongue/throat swelling, SOB or lightheadedness with hypotension Unsure  Has patient had a PCN reaction causing severe rash involving mucus membranes or skin necrosis Unsure  Has patient had a PCN reaction that required hospitalization Unsure  Has patient had a PCN reaction occurring within the last 10 years Unsure  If all of the above answers are "NO", then may proceed with Cephalosporin use.  Clementeen Hoof [Iodinated Diagnostic Agents] Rash  . Tetanus Toxoids Rash       . Zetia [Ezetimibe] Rash    Labs:  Results for orders placed or performed during the hospital encounter of 11/13/17 (from the past 48 hour(s))  Comprehensive metabolic panel     Status: Abnormal   Collection Time: 11/13/17  3:24 PM  Result Value Ref Range   Sodium 138 135 - 145 mmol/L   Potassium 5.7 (H) 3.5 - 5.1 mmol/L   Chloride 103 101 - 111 mmol/L   CO2 27 22 - 32 mmol/L   Glucose, Bld 71 65 - 99 mg/dL   BUN 37 (H) 6 - 20 mg/dL   Creatinine, Ser 1.95 (H) 0.44 - 1.00 mg/dL   Calcium 9.3 8.9 - 10.3 mg/dL   Total Protein 7.6 6.5 - 8.1 g/dL   Albumin 4.4 3.5 - 5.0 g/dL   AST 28 15 - 41 U/L   ALT 26 14 - 54 U/L   Alkaline Phosphatase 71 38 - 126 U/L  Total Bilirubin 0.6 0.3 - 1.2 mg/dL   GFR calc non Af Amer 25 (L) >60 mL/min   GFR calc Af Amer 29 (L) >60 mL/min    Comment: (NOTE) The eGFR has been calculated using the CKD EPI equation. This calculation has not been validated in all clinical situations. eGFR's persistently <60 mL/min signify possible Chronic  Kidney Disease.    Anion gap 8 5 - 15    Comment: Performed at [facility], [address] [place], [place] [address]  Ethanol     Status: None   Collection Time: 11/13/17  3:24 PM  Result Value Ref Range   Alcohol, Ethyl (B) <10 <10 mg/dL    Comment:        LOWEST DETECTABLE LIMIT FOR SERUM ALCOHOL IS 10 mg/dL FOR MEDICAL PURPOSES ONLY Performed at [facility], [address] [place], [place] [address]   Salicylate level     Status: None   Collection Time: 11/13/17  3:24 PM  Result Value Ref Range   Salicylate Lvl <7.8 2.8 - 30.0 mg/dL    Comment: Performed at [facility], [address] [place], [place] [address]  Acetaminophen level     Status: Abnormal   Collection Time: 11/13/17  3:24 PM  Result Value Ref Range   Acetaminophen (Tylenol), Serum <10 (L) 10 - 30 ug/mL    Comment:        THERAPEUTIC CONCENTRATIONS VARY SIGNIFICANTLY. A RANGE OF 10-30 ug/mL MAY BE AN EFFECTIVE CONCENTRATION FOR MANY PATIENTS. HOWEVER, SOME ARE BEST TREATED AT CONCENTRATIONS OUTSIDE THIS RANGE. ACETAMINOPHEN CONCENTRATIONS >150 ug/mL AT 4 HOURS AFTER INGESTION AND >50 ug/mL AT 12 HOURS AFTER INGESTION ARE OFTEN ASSOCIATED WITH TOXIC REACTIONS. Performed at [facility], [address] [place], [place] [address]   cbc     Status: Abnormal   Collection Time: 11/13/17  3:24 PM  Result Value Ref Range   WBC 6.4 3.6 - 11.0 K/uL   RBC 3.64 (L) 3.80 - 5.20 MIL/uL   Hemoglobin 12.4 12.0 - 16.0 g/dL   HCT 35.1 35.0 - 47.0 %   MCV 96.2 80.0 - 100.0 fL   MCH 33.9 26.0 - 34.0 pg   MCHC 35.3 32.0 - 36.0 g/dL   RDW 13.5 11.5 - 14.5 %   Platelets 123 (L) 150 - 440 K/uL    Comment: Performed at [facility], [address], [place], [place] [address]  Urine Drug Screen, Qualitative     Status: None   Collection Time: 11/13/17  3:24 PM  Result Value Ref Range   Tricyclic, Ur Screen NONE DETECTED NONE DETECTED   Amphetamines, Ur Screen  NONE DETECTED NONE DETECTED   MDMA (Ecstasy)Ur Screen NONE DETECTED NONE DETECTED   Cocaine Metabolite,Ur [place] NONE DETECTED NONE DETECTED   Opiate, Ur Screen NONE DETECTED NONE DETECTED   Phencyclidine (PCP) Ur S NONE DETECTED NONE DETECTED   Cannabinoid 50 Ng, Ur [place] NONE DETECTED NONE DETECTED   Barbiturates, Ur Screen NONE DETECTED NONE DETECTED   Benzodiazepine, Ur Scrn NONE DETECTED NONE DETECTED   Methadone Scn, Ur NONE DETECTED NONE DETECTED    Comment: (NOTE) Tricyclics + metabolites, urine    Cutoff 1000 ng/mL Amphetamines + metabolites, urine  Cutoff 1000 ng/mL MDMA (Ecstasy), urine              Cutoff 500 ng/mL Cocaine Metabolite, urine          Cutoff 300 ng/mL Opiate + metabolites, urine  Cutoff 300 ng/mL Phencyclidine (PCP), urine         Cutoff 25 ng/mL Cannabinoid, urine                 Cutoff 50 ng/mL Barbiturates + metabolites, urine  Cutoff 200 ng/mL Benzodiazepine, urine              Cutoff 200 ng/mL Methadone, urine                   Cutoff 300 ng/mL The urine drug screen provides only a preliminary, unconfirmed analytical test result and should not be used for non-medical purposes. Clinical consideration and professional judgment should be applied to any positive drug screen result due to possible interfering substances. A more specific alternate chemical method must be used in order to obtain a confirmed analytical result. Gas chromatography / mass spectrometry (GC/MS) is the preferred confirmat ory method. Performed at [facility], [address], [place], [place] [address]     Current Facility-Administered Medications  Medication Dose Route Frequency Provider Last Rate Last Dose  . divalproex (DEPAKOTE) DR tablet 500 mg  500 mg Oral Q12H Jevaughn Degollado T, MD      . docusate sodium (COLACE) capsule 100 mg  100 mg Oral BID Zailen Albarran T, MD      . etodolac (LODINE) capsule 200 mg  200 mg Oral BID Contessa Preuss T, MD      . lamoTRIgine  (LAMICTAL) tablet 25 mg  25 mg Oral Daily Joaopedro Eschbach, Madie Reno, MD      . Derrill Memo ON 11/14/2017] levothyroxine (SYNTHROID, LEVOTHROID) tablet 50 mcg  50 mcg Oral QAC breakfast Ameshia Pewitt T, MD      . losartan (COZAAR) tablet 50 mg  50 mg Oral Daily Phillippe Orlick T, MD      . Derrill Memo ON 11/14/2017] metFORMIN (GLUCOPHAGE) tablet 500 mg  500 mg Oral Q breakfast Neeka Urista T, MD      . metoprolol tartrate (LOPRESSOR) tablet 12.5 mg  12.5 mg Oral BID Ariza Evans T, MD      . QUEtiapine (SEROQUEL) tablet 300 mg  300 mg Oral QHS Willard Madrigal T, MD      . Derrill Memo ON 11/14/2017] simvastatin (ZOCOR) tablet 20 mg  20 mg Oral q1800 Stanislawa Gaffin, Madie Reno, MD       Current Outpatient Medications  Medication Sig Dispense Refill  . ACCU-CHEK AVIVA PLUS test strip 1 each by Other route daily. E11.21 100 each 3  . divalproex (DEPAKOTE) 500 MG DR tablet Take 1 tablet (500 mg total) by mouth 2 (two) times daily with a meal. 60 tablet 3  . docusate sodium (COLACE) 100 MG capsule Take 1 capsule (100 mg total) by mouth 2 (two) times daily. 30 capsule 1  . etodolac (LODINE) 200 MG capsule Take 1 capsule (200 mg total) by mouth 2 (two) times daily. 60 capsule 3  . lamoTRIgine (LAMICTAL) 25 MG tablet Take 25 mg by mouth daily.    Marland Kitchen levothyroxine (SYNTHROID, LEVOTHROID) 50 MCG tablet Take 1 tablet (50 mcg total) by mouth daily before breakfast. 30 tablet 11  . losartan (COZAAR) 50 MG tablet Take 1 tablet (50 mg total) by mouth daily. 30 tablet 11  . MELATONIN PO Take 1 tablet by mouth at bedtime.    . metFORMIN (GLUCOPHAGE) 500 MG tablet Take 1 tablet (500 mg total) by mouth daily with breakfast. 30 tablet 6  . metoprolol tartrate (LOPRESSOR) 25 MG tablet TAKE 1 & 1/2  TABLET BY MOUTH TWICE DAILY WITH FOOD 84 tablet 5  . nystatin (MYCOSTATIN/NYSTOP) powder Apply topically 3 (three) times daily. 60 g 1  . paliperidone (INVEGA SUSTENNA) 234 MG/1.5ML SUSP injection Inject 234 mg into the muscle every 30 (thirty) days.    .  polyethylene glycol powder (GLYCOLAX/MIRALAX) powder TAKE 17 GRAMS BY MOUTH TWICE DAILY AS NEEDED FOR MODERATE CONSTIPATION. 527 g 3  . QUEtiapine (SEROQUEL) 300 MG tablet Take 1 tablet (300 mg total) by mouth at bedtime. 30 tablet 1  . senna (SENOKOT) 8.6 MG TABS tablet Take 1 tablet (8.6 mg total) by mouth at bedtime as needed for mild constipation. 120 each 0  . simvastatin (ZOCOR) 20 MG tablet Take 1 tablet (20 mg total) by mouth at bedtime. 30 tablet 11    Musculoskeletal: Strength & Muscle Tone: within normal limits Gait & Station: normal Patient leans: N/A  Psychiatric Specialty Exam: Physical Exam  Nursing note and vitals reviewed. Constitutional: She appears well-developed and well-nourished.  HENT:  Head: Normocephalic and atraumatic.  Eyes: Pupils are equal, round, and reactive to light. Conjunctivae are normal.  Neck: Normal range of motion.  Cardiovascular: Regular rhythm and normal heart sounds.  Respiratory: Effort normal. No respiratory distress.  GI: Soft.  Musculoskeletal: Normal range of motion.  Neurological: She is alert.  Skin: Skin is warm and dry.  Psychiatric: Her mood appears anxious. Her speech is rapid and/or pressured and tangential. She is agitated. She is not aggressive. Thought content is paranoid. Cognition and memory are impaired. She expresses impulsivity. She expresses no homicidal and no suicidal ideation.    Review of Systems  Constitutional: Negative.   HENT: Negative.   Eyes: Negative.   Respiratory: Negative.   Cardiovascular: Negative.   Gastrointestinal: Negative.   Musculoskeletal: Negative.   Skin: Negative.   Neurological: Negative.   Psychiatric/Behavioral: Positive for depression and hallucinations. Negative for memory loss, substance abuse and suicidal ideas. The patient is nervous/anxious and has insomnia.     Blood pressure (!) 172/74, pulse 66, temperature 98.9 F (37.2 C), temperature source Oral, resp. rate 18, height 5' 2"   (1.575 m), weight 89.8 kg (198 lb), SpO2 97 %.Body mass index is 36.21 kg/m.  General Appearance: Casual  Eye Contact:  Minimal  Speech:  Pressured  Volume:  Increased  Mood:  Euphoric  Affect:  Labile  Thought Process:  Disorganized  Orientation:  Full (Time, Place, and Person)  Thought Content:  Illogical, Paranoid Ideation and Rumination  Suicidal Thoughts:  No  Homicidal Thoughts:  No  Memory:  Immediate;   Fair Recent;   Fair Remote;   Fair  Judgement:  Fair  Insight:  Fair  Psychomotor Activity:  Normal  Concentration:  Concentration: Poor  Recall:  Poor  Fund of Knowledge:  Poor  Language:  Poor  Akathisia:  No  Handed:  Right  AIMS (if indicated):     Assets:  Desire for Improvement Housing Resilience Social Support  ADL's:  Impaired  Cognition:  Impaired,  Mild  Sleep:        Treatment Plan Summary: Daily contact with patient to assess and evaluate symptoms and progress in treatment, Medication management and Plan Patient with a history of bipolar disorder returns to the hospital endorsing multiple symptoms of mania and depression with racing thoughts labile mood agitation disorganized thinking hallucinations fear difficulty sleeping at night.  Patient will be readmitted to the psychiatric ward.  Continue medicines as prescribed previously.  15-minute checks in place.  Reassessed  to see what else can be done to ensure stability at discharge.  Disposition: Recommend psychiatric Inpatient admission when medically cleared.  Alethia Berthold, MD 11/13/2017 6:11 PM

## 2017-11-13 NOTE — BH Assessment (Signed)
Patient is to be admitted to [facility] by Dr. Weber Cooks.  Attending Physician will be Dr. Montel Culver.   Patient has been assigned to room 10, by [place].   Intake Paper Work has been signed and placed on patient chart.  ER staff is aware of the admission:  [facility]   Dr. Rip Harbour, ER MD   [facility] Patient's Nurse   [place] Patient Access.

## 2017-11-13 NOTE — Progress Notes (Signed)
Patient ID: Gina [name], female   DOB: 20-Mar-1951, 67 y.o.   MRN: [id number] Per State regulations 482.30 this chart was reviewed for medical necessity with respect to the patient's admission/duration of stay.    Next review date: 11/17/17  Debarah Crape, BSN, RN-BC  Case Manager

## 2017-11-13 NOTE — BH Assessment (Signed)
Per Dr. Prescott Gum patient meets criteria for inpatient psychiatric treatment and is pending a bed assignment in the [facility].

## 2017-11-13 NOTE — ED Provider Notes (Signed)
[facility] Emergency Department Provider Note   ____________________________________________   First MD Initiated Contact with Patient 11/13/17 1630     (approximate)  I have reviewed the triage vital signs and the nursing notes.   HISTORY  Chief Complaint Psychiatric Evaluation    HPI Gina [name] is a 67 y.o. female Patient was just discharged on Monday but comes back here with the act team. The act team reports increased mania and disorganized thought process. Patient is here voluntarily. Patient has not indicated SI or HI to anyone.Dr. Lissa Hoard packs psychiatry is seen the patient and will plan on admitting the patient.  Past Medical History:  Diagnosis Date  . Anemia in chronic kidney disease   . Bipolar depression [facility])    sees psychiatrist - psych admission 03/2015  . Bladder cancer ([place]) 1990's  . Blood transfusion without reported diagnosis 2009  . Cataract    left  . CKD (chronic kidney disease) stage 3, GFR 30-59 ml/min (HCC)   . Colon cancer ([place]) 1990's  . DJD (degenerative joint disease), lumbar    chronic lower back pain  . Enterocutaneous fistula 04/07/2012   completed PT 06/2012 ([facility])  . Family history of breast cancer   . Family history of colon cancer   . Family history of stomach cancer   . GERD (gastroesophageal reflux disease)   . History of bladder cancer 1997  . History of colon cancer    s/p surgery  . History of uterine cancer    s/p hysterectomy  . Hyperlipidemia   . Hypertension   . IDA (iron deficiency anemia) 01/2013   thought due to h/o polyps  . Obesity (BMI 30-39.9)   . OSA on CPAP    6cm H2O  . Personal history of colonic adenomas and colon cancer 11/11/2012  . Positive hepatitis C antibody test 09/2014   but negative confirmatory testing  . RBBB   . Uncontrolled type 2 diabetes mellitus with nephropathy ([place]) 06/07/2014   completed DSME 02/2016    Patient Active Problem List   Diagnosis  Date Noted  . Bipolar I disorder, current or most recent episode manic, with psychotic features ([place]) 11/04/2017  . Gallbladder polyp 10/21/2017  . Left shoulder pain 10/21/2017  . Benign neoplasm of ascending colon   . Bipolar affective disorder, current episode hypomanic ([place]) 04/08/2017  . Low back pain radiating to left lower extremity 02/26/2017  . Acquired hypothyroidism 09/10/2016  . Thrombocytopenia ([place]) 06/15/2016  . Medicare annual wellness visit, subsequent 06/04/2016  . Advanced care planning/counseling discussion 06/04/2016  . External hemorrhoid 04/02/2016  . Slow transit constipation 01/04/2016  . Bipolar I disorder, most recent episode (or current) manic ([place]) 12/03/2015  . NAFLD (nonalcoholic fatty liver disease) 03/28/2015  . Gallbladder calculus without cholecystitis 03/28/2015  . Gout 03/27/2015  . HCV antibody positive 03/27/2015  . History of bladder cancer 03/27/2015  . History of bundle branch block 03/27/2015  . Genetic testing 02/28/2015  . Loss of memory 09/04/2014  . Controlled type 2 diabetes mellitus with diabetic nephropathy ([place]) 06/07/2014  . Hyperlipidemia   . Anemia in CKD (chronic kidney disease) 01/26/2013  . Personal history of colonic adenomas and colon cancer 11/11/2012  . Osteoarthritis of knee 10/02/2012  . OSA on CPAP   . CKD (chronic kidney disease) stage 3, GFR 30-59 ml/min (HCC)   . Severe obesity (BMI 35.0-39.9) with comorbidity ([place])   . HTN (hypertension), benign 04/07/2012  . Hx of colon cancer, stage  I 08/05/2011    Past Surgical History:  Procedure Laterality Date  . BREAST BIOPSY Right 01/2014   fibroadenoma  . COLONOSCOPY  07/2009  . COLONOSCOPY  11/2012   2 tubular adenomas, mild diverticulosis, pending genetic testing for Lynch syndrome [name]) rpt 2 yrs  . COLONOSCOPY  02/2015   TA, diverticulosis, rpt 2 yrs [name])  . COLONOSCOPY WITH PROPOFOL N/A 06/09/2017   TAx1, rpt 2 yrs [name], [name], MD)  . DEXA  12/2009    WNL  . DOBUTAMINE STRESS ECHO  12/2009   no evidence of ischemia  . HERNIA REPAIR  02/04/12  . i & d abdominal wound  02/19/12  . LEFT OOPHORECTOMY  2005  . PARTIAL COLECTOMY  about 2008   for colon cancer  . PARTIAL HYSTERECTOMY  1981   uterine cancer, R ovary remains  . Reexploration of abdominal wound and Allograft placemet  02/26/12  . Removal of infected mesh and abdominal wound vac placement  02/24/12  . sleep study  02/2014   OSA - AHI 55, nadir 81% [name])    Prior to Admission medications   Medication Sig Start Date End Date Taking? Authorizing Provider  ACCU-CHEK AVIVA PLUS test strip 1 each by Other route daily. E11.21 09/18/17   Ria Bush, MD  divalproex (DEPAKOTE) 500 MG DR tablet Take 1 tablet (500 mg total) by mouth 2 (two) times daily with a meal. 01/15/16   Ria Bush, MD  docusate sodium (COLACE) 100 MG capsule Take 1 capsule (100 mg total) by mouth 2 (two) times daily. 11/09/17   Pucilowska, Herma Ard B, MD  etodolac (LODINE) 200 MG capsule Take 1 capsule (200 mg total) by mouth 2 (two) times daily. 11/09/17   Pucilowska, Herma Ard B, MD  lamoTRIgine (LAMICTAL) 25 MG tablet Take 25 mg by mouth daily.    [provider]  levothyroxine (SYNTHROID, LEVOTHROID) 50 MCG tablet Take 1 tablet (50 mcg total) by mouth daily before breakfast. 10/21/17   Ria Bush, MD  losartan (COZAAR) 50 MG tablet Take 1 tablet (50 mg total) by mouth daily. 07/03/17   Ria Bush, MD  MELATONIN PO Take 1 tablet by mouth at bedtime.    [provider]  metFORMIN (GLUCOPHAGE) 500 MG tablet Take 1 tablet (500 mg total) by mouth daily with breakfast. 07/03/17   Ria Bush, MD  metoprolol tartrate (LOPRESSOR) 25 MG tablet TAKE 1 & 1/2 TABLET BY MOUTH TWICE DAILY WITH FOOD 08/24/17   Ria Bush, MD  nystatin (MYCOSTATIN/NYSTOP) powder Apply topically 3 (three) times daily. 04/27/17   Ria Bush, MD  paliperidone (INVEGA SUSTENNA) 234 MG/1.5ML SUSP  injection Inject 234 mg into the muscle every 30 (thirty) days.    [provider]  polyethylene glycol powder (GLYCOLAX/MIRALAX) powder TAKE 17 GRAMS BY MOUTH TWICE DAILY AS NEEDED FOR MODERATE CONSTIPATION. 05/11/17   Ria Bush, MD  QUEtiapine (SEROQUEL) 300 MG tablet Take 1 tablet (300 mg total) by mouth at bedtime. 11/09/17   Pucilowska, Wardell Honour, MD  senna (SENOKOT) 8.6 MG TABS tablet Take 1 tablet (8.6 mg total) by mouth at bedtime as needed for mild constipation. 11/09/17   Pucilowska, Jolanta B, MD  simvastatin (ZOCOR) 20 MG tablet Take 1 tablet (20 mg total) by mouth at bedtime. 07/03/17   Ria Bush, MD    Allergies Risperidone and related; Penicillins; Ivp dye [iodinated diagnostic agents]; Tetanus toxoids; and Zetia [ezetimibe]  Family History  Problem Relation Age of Onset  . Colon cancer Mother 9  .  Stomach cancer Mother        dx in her 46s?  . CAD Father        MI; deceased 43  . Mental illness Sister        anxiety/depression  . Thyroid disease Sister   . Breast cancer Maternal Grandmother        age at dx unknown  . Diabetes Brother   . Diabetes Brother   . Diabetes Other        aunts and uncles both sides  . Arthritis Other        strong fmhx  . Colon cancer Other 51       maternal half-sister; deceased    Social History Social History   Tobacco Use  . Smoking status: Never Smoker  . Smokeless tobacco: Never Used  Substance Use Topics  . Alcohol use: No    Alcohol/week: 0.0 oz  . Drug use: No    Review of Systems  Constitutional: No fever/chills Eyes: No visual changes. ENT: No sore throat. Cardiovascular: Denies chest pain. Respiratory: Denies shortness of breath. Gastrointestinal: No abdominal painexcept for an area of her hernia in the left lower abdomen this is chronic is bigger and when she sits or stands.  No nausea, no vomiting.  No diarrhea.  No constipation. Genitourinary: Negative for dysuria. Musculoskeletal:  Negative for back pain. Skin: Negative for rash. Neurological: Negative for headaches, focal weakness  ____________________________________________   PHYSICAL EXAM:  VITAL SIGNS: ED Triage Vitals  Enc Vitals Group     BP 11/13/17 1515 (!) 172/74     Pulse Rate 11/13/17 1515 66     Resp 11/13/17 1515 18     Temp 11/13/17 1515 98.9 F (37.2 C)     Temp Source 11/13/17 1515 Oral     SpO2 11/13/17 1515 97 %     Weight 11/13/17 1516 198 lb (89.8 kg)     Height 11/13/17 1516 5\' 2"  (1.575 m)     Head Circumference --      Peak Flow --      Pain Score 11/13/17 1523 8     Pain Loc --      Pain Edu? --      Excl. in [place]? --     Constitutional: Alert and oriented. Well appearing and in no acute distress. Eyes: Conjunctivae are normal. Head: Atraumatic. Nose: No congestion/rhinnorhea. Mouth/Throat: Mucous membranes are moist.  Oropharynx non-erythematous. Neck: No stridor.   Cardiovascular: Normal rate, regular rhythm. Grossly normal heart sounds.  Good peripheral circulation. Respiratory: Normal respiratory effort.  No retractions. Lungs CTAB. Gastrointestinal: Soft and nontenderat present. No distention. No abdominal bruits. No CVA tenderness. Musculoskeletal: No lower extremity tenderness nor edema.   Neurologic:  Normal speech and language. No gross focal neurologic deficits are appreciated.  Skin:  Skin is warm, dry and intact. No rash noted. al.  ____________________________________________   LABS (all labs ordered are listed, but only abnormal results are displayed)  Labs Reviewed  COMPREHENSIVE METABOLIC PANEL - Abnormal; Notable for the following components:      Result Value   Potassium 5.7 (*)    BUN 37 (*)    Creatinine, Ser 1.95 (*)    GFR calc non Af Amer 25 (*)    GFR calc Af Amer 29 (*)    All other components within normal limits  ACETAMINOPHEN LEVEL - Abnormal; Notable for the following components:   Acetaminophen (Tylenol), Serum <10 (*)    All other  components within normal limits  CBC - Abnormal; Notable for the following components:   RBC 3.64 (*)    Platelets 123 (*)    All other components within normal limits  ETHANOL  SALICYLATE LEVEL  URINE DRUG SCREEN, QUALITATIVE ([facility] ONLY)   ____________________________________________  EKG   ____________________________________________  RADIOLOGY  ED MD interpretation:    Official radiology report(s): No results found.  ____________________________________________   PROCEDURES  Procedure(s) performed:   Procedures  Critical Care performed:   ____________________________________________   INITIAL IMPRESSION / ASSESSMENT AND PLAN / ED COURSE           ____________________________________________   FINAL CLINICAL IMPRESSION(S) / ED DIAGNOSES  Final diagnoses:  [facility])     ED Discharge Orders    None       Note:  This document was prepared using Dragon voice recognition software and may include unintentional dictation errors.    Nena Polio, MD 11/13/17 [phone]

## 2017-11-13 NOTE — Telephone Encounter (Signed)
Spoke with Triage nurse Lucynda at 10am this morning.  Gave her direction if family member feels unsafe at any point she should get out of the house and call [phone] for police and EMS.  I also made Dr. Darnell Level aware and he had no further instruction to give at this point.  He will see patient on 4/16 at her hosp f/u visit.  Please contact the ACT team to discuss concerns further if needed.

## 2017-11-14 DIAGNOSIS — F3174 Bipolar disorder, in full remission, most recent episode manic: Secondary | ICD-10-CM

## 2017-11-14 LAB — LIPID PANEL
Cholesterol: 140 mg/dL (ref 0–200)
HDL: 50 mg/dL (ref >40)
LDL Cholesterol: 60 mg/dL (ref 0–99)
Total CHOL/HDL Ratio: 2.8 RATIO
Triglycerides: 152 mg/dL — ABNORMAL HIGH (ref <150)
VLDL: 30 mg/dL (ref 0–40)

## 2017-11-14 LAB — GLUCOSE, CAPILLARY: GLUCOSE-CAPILLARY: 96 mg/dL (ref 65–99)

## 2017-11-14 LAB — TSH: TSH: 5.852 u[IU]/mL — AB (ref 0.350–4.500)

## 2017-11-14 LAB — HEMOGLOBIN A1C
HEMOGLOBIN A1C: 5.9 % — AB (ref 4.8–5.6)
MEAN PLASMA GLUCOSE: 122.63 mg/dL

## 2017-11-14 MED ORDER — TRAZODONE HCL 100 MG PO TABS
100.0000 mg | ORAL_TABLET | Freq: Every day | ORAL | Status: DC
  Administered 2017-11-14 – 2017-11-22 (×9): 100 mg via ORAL
  Filled 2017-11-14 (×9): qty 1

## 2017-11-14 MED ORDER — LEVOTHYROXINE SODIUM 50 MCG PO TABS
50.0000 ug | ORAL_TABLET | Freq: Every day | ORAL | Status: DC
  Administered 2017-11-14 – 2017-11-24 (×11): 50 ug via ORAL
  Filled 2017-11-14 (×14): qty 1

## 2017-11-14 NOTE — B[facility] Counselor (Signed)
Adult Comprehensive Assessment  Patient ID: Gina [name], female   DOB: 1951-06-17, 67 y.o.   MRN: [id number]  Information Source: Information source: Patient  Current Stressors:  Educational / Learning stressors: None reported.  Employment / Job issues: Pt collects disability.  Family Relationships: Pt reports, "I only have my sister."  Financial / Lack of resources (include bankruptcy): No issues reported.  Housing / Lack of housing: Pt lives with her sister and is able to return upon discharge.  Physical health (include injuries & life threatening diseases): Pt reports a history of cancer. Pt also reports other medical problems, but was unable to provide specific information.  Social relationships: Pt reports, "I only have my sister. That's it."  Substance abuse: None reported.  Bereavement / Loss: No loss reported.   Living/Environment/Situation:  Living Arrangements: Other relatives(Pt lives with her sister. ) Living conditions (as described by patient or guardian): "Good. But, we don't know nobody down here."  How long has patient lived in current situation?: Six years  What is atmosphere in current home: Comfortable, Supportive, Loving  Family History:  Marital status: Single Are you sexually active?: No What is your sexual orientation?: "I'm just a regular girl." Pt was unable to elaborate on this response.  Has your sexual activity been affected by drugs, alcohol, medication, or emotional stress?: Pt denies.  Does patient have children?: No  Childhood History:  By whom was/is the patient raised?: Both parents Additional childhood history information: "My mom would put me in the hospital when I would act up. But, we had a great relationship." Description of patient's relationship with caregiver when they were a child: "I was very close with both of my parents."  Patient's description of current relationship with people who raised him/her: Both of pt's parents  are deceased.  How were you disciplined when you got in trouble as a child/adolescent?: "My mom would put me in the hospital. I never got spankings or nothing like that."  Does patient have siblings?: Yes Number of Siblings: 4 Description of patient's current relationship with siblings: Pt reports having two sisters with whom she has a good relationship, and two brothers who she states "she isn't close with anymore."  Did patient suffer any verbal/emotional/physical/sexual abuse as a child?: Yes Did patient suffer from severe childhood neglect?: No Has patient ever been sexually abused/assaulted/raped as an adolescent or adult?: No Was the patient ever a victim of a crime or a disaster?: No Spoken with a professional about abuse?: No Does patient feel these issues are resolved?: (N/A) Witnessed domestic violence?: No Has patient been effected by domestic violence as an adult?: No  Education:  Highest grade of school patient has completed: GED Currently a Ship broker?: No Learning disability?: No  Employment/Work Situation:   Employment situation: On disability Why is patient on disability: "mood swings"  How long has patient been on disability: "I was on disability but now I'm on social security."  Patient's job has been impacted by current illness: No What is the longest time patient has a held a job?: "over thirty years."  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 If attempted suicide, did drugs/alcohol play a role in this?: No Alcohol/Substance Abuse Treatment Hx: Denies past history Has alcohol/substance abuse ever caused legal problems?: No  Social Support System:   Patient's Community Support System: Fair Describe Community Support System: Pt reports her sister is very supportive, and she has an Aeronautical engineer with [employer].  Type of faith/religion: Pt reports being [employer].  How does patient's faith help to cope with current illness?: Prayer   Leisure/Recreation:   Leisure and Hobbies: "I used to collect [employer]. Now I like to do word searches and read the bible."   Strengths/Needs:   What things does the patient do well?: Family support In what areas does patient struggle / problems for patient: problems sleeping, limited insight.   Discharge Plan:   Does patient have access to transportation?: Yes Will patient be returning to same living situation after discharge?: Yes Currently receiving community mental health services: Yes (From Whom)([facility]) If no, would patient like referral for services when discharged?: (N/A) Does patient have financial barriers related to discharge medications?: No      Summary/Recommendations:     Patient is a 67 year old female admitted with a history of bipolar disorder. Just discharged from the hospital a few days ago, was inpatient from 11/04/17- 11/09/17. Pt admitted voluntarily. Patient reports worsening mood for last few days, laughing and crying, unable to sleep. Patient reports taking medications, reports she was discharged too soon and was not quite ready for it. Pt has pressured of speech with disorganized thinking, patient is labile. Patient will benefit from crisis stabilization, medication evaluation, group therapy and psychoeducation. In addition to case management for discharge planning. At discharge it is recommended that patient adhere to the established discharge plan and  continue treatment.   [place], LCSWA  11/14/2017

## 2017-11-14 NOTE — Progress Notes (Signed)
Patient is newly admitted. Presented with chief complaint of depression and anxiety, reporting that she misses her parents who are deceased. Reports that she does not like her current living environment. Patient was recently  discharged from this facility: Reports that she was discharged so soon "I don't think I was ready". Patient is is requesting to speak to a pastor or any other spiritual professional "because I am a christian, I am [employer]". Patient slept a few hours then woke up. Pleasant and cooperative and denying thoughts of self harm. Currently walking around in the milieu and has no sign of distress. Support provided and safety precautions maintained.

## 2017-11-14 NOTE — H&P (Addendum)
Psychiatric Admission Assessment Adult  Patient Identification: Gina [name] MRN:  [id number] Date of Evaluation:  11/14/2017 Chief Complaint:  Bipolar Disorder Principal Diagnosis: <principal problem not specified> Diagnosis:   Patient Active Problem List   Diagnosis Date Noted  . Bipolar 1 disorder, manic, full remission ([place]) [F31.74] 11/13/2017  . Bipolar I disorder, current or most recent episode manic, with psychotic features ([place]) [F31.2] 11/04/2017  . Gallbladder polyp [K82.4] 10/21/2017  . Left shoulder pain [M25.512] 10/21/2017  . Benign neoplasm of ascending colon [D12.2]   . Bipolar affective disorder, current episode hypomanic ([name]) [F31.0] 04/08/2017  . Low back pain radiating to left lower extremity [M54.5, M79.605] 02/26/2017  . Acquired hypothyroidism [E03.9] 09/10/2016  . Thrombocytopenia ([place]) [D69.6] 06/15/2016  . Medicare annual wellness visit, subsequent [Z00.00] 06/04/2016  . Advanced care planning/counseling discussion [Z71.89] 06/04/2016  . External hemorrhoid [K64.4] 04/02/2016  . Slow transit constipation [K59.01] 01/04/2016  . Bipolar I disorder, most recent episode (or current) manic ([place]) [F31.10] 12/03/2015  . NAFLD (nonalcoholic fatty liver disease) [K76.0] 03/28/2015  . Gallbladder calculus without cholecystitis [K80.20] 03/28/2015  . Gout [M10.9] 03/27/2015  . HCV antibody positive [R76.8] 03/27/2015  . History of bladder cancer [Z85.51] 03/27/2015  . History of bundle branch block [Z86.79] 03/27/2015  . Genetic testing [Z13.79] 02/28/2015  . Loss of memory [R41.3] 09/04/2014  . Controlled type 2 diabetes mellitus with diabetic nephropathy ([place]) [E11.21] 06/07/2014  . Hyperlipidemia [E78.5]   . Anemia in CKD (chronic kidney disease) [N18.9, D63.1] 01/26/2013  . Personal history of colonic adenomas and colon cancer [Z86.010] 11/11/2012  . Osteoarthritis of knee [M17.10] 10/02/2012  . OSA on CPAP [G47.33, Z99.89]   . CKD (chronic kidney  disease) stage 3, GFR 30-59 ml/min (HCC) [N18.3]   . Severe obesity (BMI 35.0-39.9) with comorbidity ([place]) [E66.01]   . HTN (hypertension), benign [I10] 04/07/2012  . Hx of colon cancer, stage I [Z85.038] 08/05/2011   History of Present Illness:  " I could not sleep, I was crying and laughingly" 67 year old woman with a history of bipolar disorder.  Just discharged from the hospital a few days ago, was inpatient from 11/04/17- 11/09/17. Pt admitted voluntarily. Pt reports worsening mood for last few days, laughing and crying, unable to sleep. Denies SI/HI. Denies any alcohol or drug abuse.  Denies any specific new stresses.Reports taking medications, reports she was discharged too soon and was not quite ready for it. Pt has pressured of speech with disorganized thinking, pt is labile.    Associated Signs/Symptoms: Depression Symptoms:  denies (Hypo) Manic Symptoms:  Distractibility, Elevated Mood, Flight of Ideas, Labiality of Mood, Anxiety Symptoms:  Excessive Worry, Psychotic Symptoms:  hallucinations PTSD Symptoms: denies Total Time spent with patient: 1 hour  Past Psychiatric History:  history of bipolar disorder.  She was in the hospital for manic symptoms last week and was just discharged , was inpatient from 11/04/17- 11/09/17  .   She does not have a history of suicide attempts or violence.     Is the patient at risk to self? No.  Has the patient been a risk to self in the past 6 months? No.  Has the patient been a risk to self within the distant past? No.  Is the patient a risk to others? No.  Has the patient been a risk to others in the past 6 months? No.  Has the patient been a risk to others within the distant past? No.   Prior Inpatient Therapy:   Prior Outpatient  Therapy:    Alcohol Screening: Patient refused Alcohol Screening Tool: Yes 1. How often do you have a drink containing alcohol?: Never 2. How many drinks containing alcohol do you have on a typical day when you  are drinking?: 1 or 2 3. How often do you have six or more drinks on one occasion?: Never AUDIT-C Score: 0 4. How often during the last year have you found that you were not able to stop drinking once you had started?: Never 5. How often during the last year have you failed to do what was normally expected from you becasue of drinking?: Never 6. How often during the last year have you needed a first drink in the morning to get yourself going after a heavy drinking session?: Never 7. How often during the last year have you had a feeling of guilt of remorse after drinking?: Never 8. How often during the last year have you been unable to remember what happened the night before because you had been drinking?: Never 9. Have you or someone else been injured as a result of your drinking?: No 10. Has a relative or friend or a doctor or another health worker been concerned about your drinking or suggested you cut down?: No Alcohol Use Disorder Identification Test Final Score (AUDIT): 0 Substance Abuse History in the last 12 months:  No. Consequences of Substance Abuse:  Previous Psychotropic Medications: Yes  Psychological Evaluations:  Past Medical History:  Past Medical History:  Diagnosis Date  . Anemia in chronic kidney disease   . Bipolar depression [facility])    sees psychiatrist - psych admission 03/2015  . Bladder cancer ([place]) 1990's  . Blood transfusion without reported diagnosis 2009  . Cataract    left  . CKD (chronic kidney disease) stage 3, GFR 30-59 ml/min (HCC)   . Colon cancer ([place]) 1990's  . DJD (degenerative joint disease), lumbar    chronic lower back pain  . Enterocutaneous fistula 04/07/2012   completed PT 06/2012 ([facility])  . Family history of breast cancer   . Family history of colon cancer   . Family history of stomach cancer   . GERD (gastroesophageal reflux disease)   . History of bladder cancer 1997  . History of colon cancer    s/p surgery  . History of uterine  cancer    s/p hysterectomy  . Hyperlipidemia   . Hypertension   . IDA (iron deficiency anemia) 01/2013   thought due to h/o polyps  . Obesity (BMI 30-39.9)   . OSA on CPAP    6cm H2O  . Personal history of colonic adenomas and colon cancer 11/11/2012  . Positive hepatitis C antibody test 09/2014   but negative confirmatory testing  . RBBB   . Uncontrolled type 2 diabetes mellitus with nephropathy ([place]) 06/07/2014   completed DSME 02/2016    Past Surgical History:  Procedure Laterality Date  . BREAST BIOPSY Right 01/2014   fibroadenoma  . COLONOSCOPY  07/2009  . COLONOSCOPY  11/2012   2 tubular adenomas, mild diverticulosis, pending genetic testing for Lynch syndrome [name]) rpt 2 yrs  . COLONOSCOPY  02/2015   TA, diverticulosis, rpt 2 yrs [name])  . COLONOSCOPY WITH PROPOFOL N/A 06/09/2017   TAx1, rpt 2 yrs [name], [name], MD)  . DEXA  12/2009   WNL  . DOBUTAMINE STRESS ECHO  12/2009   no evidence of ischemia  . HERNIA REPAIR  02/04/12  . i & d abdominal wound  02/19/12  .  LEFT OOPHORECTOMY  2005  . PARTIAL COLECTOMY  about 2008   for colon cancer  . PARTIAL HYSTERECTOMY  1981   uterine cancer, R ovary remains  . Reexploration of abdominal wound and Allograft placemet  02/26/12  . Removal of infected mesh and abdominal wound vac placement  02/24/12  . sleep study  02/2014   OSA - AHI 55, nadir 81% [name])   Family History:  Family History  Problem Relation Age of Onset  . Colon cancer Mother 17  . Stomach cancer Mother        dx in her 54s?  . CAD Father        MI; deceased 50  . Mental illness Sister        anxiety/depression  . Thyroid disease Sister   . Breast cancer Maternal Grandmother        age at dx unknown  . Diabetes Brother   . Diabetes Brother   . Diabetes Other        aunts and uncles both sides  . Arthritis Other        strong fmhx  . Colon cancer Other 26       maternal half-sister; deceased   Family Psychiatric  History: Positive for bipolar,  sister- depression   Tobacco Screening: Have you used any form of tobacco in the last 30 days? (Cigarettes, Smokeless Tobacco, Cigars, and/or Pipes): No Social History:  Social History   Substance and Sexual Activity  Alcohol Use No  . Alcohol/week: 0.0 oz     Social History   Substance and Sexual Activity  Drug Use No    Additional Social History:       lives with sister                    Allergies:   Allergies  Allergen Reactions  . Risperidone And Related Other (See Comments)    Reaction:  Altered mental status   . Penicillins Other (See Comments)    Pt states that this med knocks her out.  Has patient had a PCN reaction causing immediate rash, facial/tongue/throat swelling, SOB or lightheadedness with hypotension Unsure  Has patient had a PCN reaction causing severe rash involving mucus membranes or skin necrosis Unsure  Has patient had a PCN reaction that required hospitalization Unsure  Has patient had a PCN reaction occurring within the last 10 years Unsure  If all of the above answers are "NO", then may proceed with Cephalosporin use.  Clementeen Hoof [Iodinated Diagnostic Agents] Rash  . Tetanus Toxoids Rash       . Zetia [Ezetimibe] Rash   Lab Results:  Results for orders placed or performed during the hospital encounter of 11/13/17 (from the past 48 hour(s))  Glucose, capillary     Status: None   Collection Time: 11/14/17  6:03 AM  Result Value Ref Range   Glucose-Capillary 96 65 - 99 mg/dL  Lipid panel     Status: Abnormal   Collection Time: 11/14/17  7:00 AM  Result Value Ref Range   Cholesterol 140 0 - 200 mg/dL   Triglycerides 152 (H) <150 mg/dL   HDL 50 >40 mg/dL   Total CHOL/HDL Ratio 2.8 RATIO   VLDL 30 0 - 40 mg/dL   LDL Cholesterol 60 0 - 99 mg/dL    Comment:        Total Cholesterol/HDL:CHD Risk Coronary Heart Disease Risk Table  Men   Women  1/2 Average Risk   3.4   3.3  Average Risk       5.0   4.4  2 X Average  Risk   9.6   7.1  3 X Average Risk  23.4   11.0        Use the calculated Patient Ratio above and the CHD Risk Table to determine the patient's CHD Risk.        ATP III CLASSIFICATION (LDL):  <100     mg/dL   Optimal  100-129  mg/dL   Near or Above                    Optimal  130-159  mg/dL   Borderline  160-189  mg/dL   High  >190     mg/dL   Very High Performed at [facility], [address] [place], [place] [address]   TSH     Status: Abnormal   Collection Time: 11/14/17  7:00 AM  Result Value Ref Range   TSH 5.852 (H) 0.350 - 4.500 uIU/mL    Comment: Performed by a 3rd Generation assay with a functional sensitivity of <=0.01 uIU/mL. Performed at [facility], [address] [place], [place] [address]     Blood Alcohol level:  Lab Results  Component Value Date   [facility] <10 11/13/2017   ETH <10 55/97/4163    Metabolic Disorder Labs:  Lab Results  Component Value Date   HGBA1C 6.3 09/30/2017   MPG 114 04/14/2012   Lab Results  Component Value Date   PROLACTIN 33.6 (H) 12/04/2015   Lab Results  Component Value Date   CHOL 140 11/14/2017   TRIG 152 (H) 11/14/2017   HDL 50 11/14/2017   CHOLHDL 2.8 11/14/2017   VLDL 30 11/14/2017   LDLCALC 60 11/14/2017   LDLCALC 81 09/30/2017    Current Medications: Current Facility-Administered Medications  Medication Dose Route Frequency Provider Last Rate Last Dose  . acetaminophen (TYLENOL) tablet 650 mg  650 mg Oral Q6H PRN Clapacs, John T, MD      . alum & mag hydroxide-simeth (MAALOX/MYLANTA) 200-200-20 MG/5ML suspension 30 mL  30 mL Oral Q4H PRN Clapacs, John T, MD      . divalproex (DEPAKOTE) DR tablet 500 mg  500 mg Oral Q12H Clapacs, Madie Reno, MD   500 mg at 11/14/17 0853  . docusate sodium (COLACE) capsule 100 mg  100 mg Oral BID Clapacs, Madie Reno, MD   100 mg at 11/14/17 0853  . etodolac (LODINE) capsule 200 mg  200 mg Oral BID Clapacs, Madie Reno, MD   200 mg at 11/14/17 0853  . hydrOXYzine  (ATARAX/VISTARIL) tablet 25 mg  25 mg Oral TID PRN Clapacs, John T, MD      . lamoTRIgine (LAMICTAL) tablet 25 mg  25 mg Oral Daily Clapacs, Madie Reno, MD   25 mg at 11/14/17 0853  . losartan (COZAAR) tablet 50 mg  50 mg Oral Daily Clapacs, Madie Reno, MD   50 mg at 11/14/17 0853  . magnesium hydroxide (MILK OF MAGNESIA) suspension 30 mL  30 mL Oral Daily PRN Clapacs, John T, MD      . metoprolol tartrate (LOPRESSOR) tablet 12.5 mg  12.5 mg Oral BID Clapacs, Madie Reno, MD   12.5 mg at 11/14/17 0853  . QUEtiapine (SEROQUEL) tablet 300 mg  300 mg Oral QHS Clapacs, Madie Reno, MD      . simvastatin (  ZOCOR) tablet 20 mg  20 mg Oral q1800 Clapacs, John T, MD      . traZODone (DESYREL) tablet 100 mg  100 mg Oral QHS PRN Clapacs, Madie Reno, MD       PTA Medications: Medications Prior to Admission  Medication Sig Dispense Refill Last Dose  . ACCU-CHEK AVIVA PLUS test strip 1 each by Other route daily. E11.21 100 each 3 Taking  . divalproex (DEPAKOTE) 500 MG DR tablet Take 1 tablet (500 mg total) by mouth 2 (two) times daily with a meal. 60 tablet 3 Taking  . docusate sodium (COLACE) 100 MG capsule Take 1 capsule (100 mg total) by mouth 2 (two) times daily. 30 capsule 1   . etodolac (LODINE) 200 MG capsule Take 1 capsule (200 mg total) by mouth 2 (two) times daily. 60 capsule 3   . lamoTRIgine (LAMICTAL) 25 MG tablet Take 25 mg by mouth daily.   Taking  . levothyroxine (SYNTHROID, LEVOTHROID) 50 MCG tablet Take 1 tablet (50 mcg total) by mouth daily before breakfast. 30 tablet 11   . losartan (COZAAR) 50 MG tablet Take 1 tablet (50 mg total) by mouth daily. 30 tablet 11 Taking  . MELATONIN PO Take 1 tablet by mouth at bedtime.   Taking  . metFORMIN (GLUCOPHAGE) 500 MG tablet Take 1 tablet (500 mg total) by mouth daily with breakfast. 30 tablet 6 Taking  . metoprolol tartrate (LOPRESSOR) 25 MG tablet TAKE 1 & 1/2 TABLET BY MOUTH TWICE DAILY WITH FOOD 84 tablet 5 Taking  . nystatin (MYCOSTATIN/NYSTOP) powder Apply  topically 3 (three) times daily. 60 g 1 Taking  . paliperidone (INVEGA SUSTENNA) 234 MG/1.5ML SUSP injection Inject 234 mg into the muscle every 30 (thirty) days.   Taking  . polyethylene glycol powder (GLYCOLAX/MIRALAX) powder TAKE 17 GRAMS BY MOUTH TWICE DAILY AS NEEDED FOR MODERATE CONSTIPATION. 527 g 3 Taking  . QUEtiapine (SEROQUEL) 300 MG tablet Take 1 tablet (300 mg total) by mouth at bedtime. 30 tablet 1   . senna (SENOKOT) 8.6 MG TABS tablet Take 1 tablet (8.6 mg total) by mouth at bedtime as needed for mild constipation. 120 each 0   . simvastatin (ZOCOR) 20 MG tablet Take 1 tablet (20 mg total) by mouth at bedtime. 30 tablet 11 Taking    Musculoskeletal: Strength & Muscle Tone: within normal limits Gait & Station: normal Patient leans:   Psychiatric Specialty Exam: Physical Exam  Nursing note and vitals reviewed.   ROS  Blood pressure 118/63, pulse (!) 59, temperature 97.9 F (36.6 C), temperature source Oral, resp. rate 18, height 5\' 2"  (1.575 m), weight 87.1 kg (192 lb).Body mass index is 35.12 kg/m.  General Appearance: Disheveled, obese  Eye Contact:  Fair  Speech:  Pressured  Volume:  Increased  Mood:  Anxious and Euphoric  Affect:  Labile  Thought Process:  Disorganized  Orientation:  ox3  Thought Content:  Jump from topic to topic, difficulty sleeping,   Suicidal Thoughts:  No  Homicidal Thoughts:  No  Memory:  intact  Judgement:  Impaired  Insight:  Shallow  Psychomotor Activity:  Increased  Concentration:  easily distractible  Recall:  Good  Fund of Knowledge:  Good  Language:  Good  Akathisia:  No  Handed:    AIMS (if indicated):     Assets:  Communication Skills  ADL's:  Intact  Cognition:  Unable to focus  Sleep:  Number of Hours: 4    Treatment Plan Summary: Daily contact with  patient to assess and evaluate symptoms and progress in treatment and Medication management.  Pt with bipolar disorder presenting with manic symptoms, second  hospitalization in a week.    Cont seroquel. Cont depakote. lamictal .  trazodone for sleep Will order Depakote level.    Observation Level/Precautions:  15 minute checks  Laboratory:  CBC Chemistry Profile UDS UA  Psychotherapy:    Medications:    Consultations:    Discharge Concerns:    Estimated LOS:  Other:     Physician Treatment Plan for Primary Diagnosis: <principal problem not specified> Long Term Goal(s): Improvement in symptoms so as ready for discharge  Short Term Goals: Ability to identify changes in lifestyle to reduce recurrence of condition will improve, Ability to verbalize feelings will improve, Ability to disclose and discuss suicidal ideas, Ability to demonstrate self-control will improve, Ability to identify and develop effective coping behaviors will improve, Ability to maintain clinical measurements within normal limits will improve, Compliance with prescribed medications will improve and Ability to identify triggers associated with substance abuse/mental health issues will improve  Physician Treatment Plan for Secondary Diagnosis: Active Problems:   Bipolar 1 disorder, manic, full remission ([place])  Long Term Goal(s): Improvement in symptoms so as ready for discharge  Short Term Goals: Ability to identify changes in lifestyle to reduce recurrence of condition will improve, Ability to verbalize feelings will improve, Ability to disclose and discuss suicidal ideas, Ability to demonstrate self-control will improve, Ability to identify and develop effective coping behaviors will improve, Ability to maintain clinical measurements within normal limits will improve, Compliance with prescribed medications will improve and Ability to identify triggers associated with substance abuse/mental health issues will improve  I certify that inpatient services furnished can reasonably be expected to improve the patient's condition.    Lenward Chancellor, MD 4/13/20192:24 PM

## 2017-11-14 NOTE — [facility] Group Notes (Signed)
LCSW Group Therapy Note 11/14/2017 1:15pm  Type of Therapy and Topic: Group Therapy: Feelings Around Returning Home & Establishing a Supportive Framework and Supporting Oneself When Supports Not Available  Participation Level: Active  Description of Group:  Patients first processed thoughts and feelings about upcoming discharge. These included fears of upcoming changes, lack of change, new living environments, judgements and expectations from others and overall stigma of mental health issues. The group then discussed the definition of a supportive framework, what that looks and feels like, and how do to discern it from an unhealthy non-supportive network. The group identified different types of supports as well as what to do when your family/friends are less than helpful or unavailable  Therapeutic Goals  1. Patient will identify one healthy supportive network that they can use at discharge. 2. Patient will identify one factor of a supportive framework and how to tell it from an unhealthy network. 3. Patient able to identify one coping skill to use when they do not have positive supports from others. 4. Patient will demonstrate ability to communicate their needs through discussion and/or role plays.  Summary of Patient Progress:  Pt reported she feels sick. She stated that she did not sleep well. Pt engaged during group session. As patients processed their anxiety about discharge and described healthy supports patient shared that she came back because she is sick and she "likes it here". Patients identified at least one self-care tool they were willing to use after discharge.   Therapeutic Modalities Cognitive Behavioral Therapy Motivational Interviewing   Gina [name]  CUEBAS-COLON, LCSW 11/14/2017 4:06 PM

## 2017-11-14 NOTE — Progress Notes (Signed)
D:Pt denies SI/HI/AVH. Pt is pleasant and cooperative, but frequently tangential, pressured, and loud. Pt. Interactions can be intrusive and hypervigilant often. Mood appears euphoric. Pt. has no Complaints this evening and reports, doing "much better definitely alright this evening". Pt. Attends groups and socializes in the milieu with staff and peers often and is often very polite to this Probation officer.   A: Q x 15 minute observation checks were completed for safety. Patient was provided with education. Patient was given scheduled medications. Patient  was encourage to attend groups, participate in unit activities and continue with plan of care.   R:Patient is complaint with medication and unit procedures.             Precautionary checks every 15 minutes for safety maintained, room free of safety hazards, patient sustains no injury or falls during this shift.

## 2017-11-14 NOTE — Plan of Care (Signed)
Newly admitted. Adjusting to the unit

## 2017-11-14 NOTE — BHH Suicide Risk Assessment (Signed)
[facility] Admission Suicide Risk Assessment   Nursing information obtained from:    Demographic factors:    Current Mental Status:    Loss Factors:    Historical Factors:    Risk Reduction Factors:     Total Time spent with patient: 1 hour Principal Problem: <principal problem not specified> Diagnosis:   Patient Active Problem List   Diagnosis Date Noted  . Bipolar 1 disorder, manic, full remission ([place]) [F31.74] 11/13/2017  . Bipolar I disorder, current or most recent episode manic, with psychotic features ([place]) [F31.2] 11/04/2017  . Gallbladder polyp [K82.4] 10/21/2017  . Left shoulder pain [M25.512] 10/21/2017  . Benign neoplasm of ascending colon [D12.2]   . Bipolar affective disorder, current episode hypomanic ([place]) [F31.0] 04/08/2017  . Low back pain radiating to left lower extremity [M54.5, M79.605] 02/26/2017  . Acquired hypothyroidism [E03.9] 09/10/2016  . Thrombocytopenia ([place]) [D69.6] 06/15/2016  . Medicare annual wellness visit, subsequent [Z00.00] 06/04/2016  . Advanced care planning/counseling discussion [Z71.89] 06/04/2016  . External hemorrhoid [K64.4] 04/02/2016  . Slow transit constipation [K59.01] 01/04/2016  . Bipolar I disorder, most recent episode (or current) manic ([place]) [F31.10] 12/03/2015  . NAFLD (nonalcoholic fatty liver disease) [K76.0] 03/28/2015  . Gallbladder calculus without cholecystitis [K80.20] 03/28/2015  . Gout [M10.9] 03/27/2015  . HCV antibody positive [R76.8] 03/27/2015  . History of bladder cancer [Z85.51] 03/27/2015  . History of bundle branch block [Z86.79] 03/27/2015  . Genetic testing [Z13.79] 02/28/2015  . Loss of memory [R41.3] 09/04/2014  . Controlled type 2 diabetes mellitus with diabetic nephropathy ([place]) [E11.21] 06/07/2014  . Hyperlipidemia [E78.5]   . Anemia in CKD (chronic kidney disease) [N18.9, D63.1] 01/26/2013  . Personal history of colonic adenomas and colon cancer [Z86.010] 11/11/2012  . Osteoarthritis of knee [M17.10]  10/02/2012  . OSA on CPAP [G47.33, Z99.89]   . CKD (chronic kidney disease) stage 3, GFR 30-59 ml/min (HCC) [N18.3]   . Severe obesity (BMI 35.0-39.9) with comorbidity ([place]) [E66.01]   . HTN (hypertension), benign [I10] 04/07/2012  . Hx of colon cancer, stage I [Z85.038] 08/05/2011   Subjective Data:  " I could not sleep, I was crying and laughingly" 67 year old woman with a history of bipolar disorder. Just discharged from the hospital a few days ago, was inpatient from 11/04/17- 11/09/17. Pt admitted voluntarily. Pt reports worsening mood for last few days, laughing and crying, unable to sleep. Denies SI/HI. Denies any alcohol or drug abuse. Denies any specific new stresses.Reports taking medications,     Continued Clinical Symptoms:  Alcohol Use Disorder Identification Test Final Score (AUDIT): 0 The "Alcohol Use Disorders Identification Test", Guidelines for Use in Primary Care, Second Edition.  World Pharmacologist [facility]). Score between 0-7:  no or low risk or alcohol related problems. Score between 8-15:  moderate risk of alcohol related problems. Score between 16-19:  high risk of alcohol related problems. Score 20 or above:  warrants further diagnostic evaluation for alcohol dependence and treatment.   CLINICAL FACTORS:   Severe Anxiety and/or Agitation Bipolar Disorder:   Mixed State   Musculoskeletal: Strength & Muscle Tone: within normal limits Gait & Station: normal Patient leans:   Psychiatric Specialty Exam: Physical Exam  Nursing note and vitals reviewed.   ROS  Blood pressure 118/63, pulse (!) 59, temperature 97.9 F (36.6 C), temperature source Oral, resp. rate 18, height 5\' 2"  (1.575 m), weight 87.1 kg (192 lb).Body mass index is 35.12 kg/m.  General Appearance: Disheveled, obese  Eye Contact:  Fair  Speech:  Pressured  Volume:  Increased  Mood:  Anxious and Euphoric  Affect:  Labile  Thought Process:  Disorganized  Orientation:  ox3  Thought  Content:  Jump from topic to topic, difficulty sleeping,   Suicidal Thoughts:  No  Homicidal Thoughts:  No  Memory:  intact  Judgement:  Impaired  Insight:  Shallow  Psychomotor Activity:  Increased  Concentration:  easily distractible  Recall:  Good  Fund of Knowledge:  Good  Language:  Good  Akathisia:  No  Handed:    AIMS (if indicated):     Assets:  Communication Skills  ADL's:  Intact  Cognition:  Unable to focus  Sleep:  Number of Hours: 4         COGNITIVE FEATURES THAT CONTRIBUTE TO RISK:  Manic, poor insight, labile    SUICIDE RISK:   mild  PLAN OF CARE:  Daily contact with patient to assess and evaluate symptoms and progress in treatment and Medication management.  Pt with bipolar disorder presenting with manic symptoms, second hospitalization in a week.    Cont seroquel. Cont depakote. lamictal .  trazodone for sleep Will order Depakote level.    Observation Level/Precautions:  15 minute checks  Laboratory:  CBC Chemistry Profile UDS UA  Psychotherapy:    Medications:    Consultations:    Discharge Concerns:    Estimated LOS:  Other:     Physician Treatment Plan for Primary Diagnosis: <principal problem not specified> Long Term Goal(s): Improvement in symptoms so as ready for discharge  Short Term Goals: Ability to identify changes in lifestyle to reduce recurrence of condition will improve, Ability to verbalize feelings will improve, Ability to disclose and discuss suicidal ideas, Ability to demonstrate self-control will improve, Ability to identify and develop effective coping behaviors will improve, Ability to maintain clinical measurements within normal limits will improve, Compliance with prescribed medications will improve and Ability to identify triggers associated with substance abuse/mental health issues will improve  Physician Treatment Plan for Secondary Diagnosis: Active Problems:   Bipolar 1 disorder, manic, full remission  ([place])  Long Term Goal(s): Improvement in symptoms so as ready for discharge  Short Term Goals: Ability to identify changes in lifestyle to reduce recurrence of condition will improve, Ability to verbalize feelings will improve, Ability to disclose and discuss suicidal ideas, Ability to demonstrate self-control will improve, Ability to identify and develop effective coping behaviors will improve, Ability to maintain clinical measurements within normal limits will improve, Compliance with prescribed medications will improve and Ability to identify triggers associated with substance abuse/mental health issues will improve      I certify that inpatient services furnished can reasonably be expected to improve the patient's condition.   Lenward Chancellor, MD 11/14/2017, 2:47 PM

## 2017-11-14 NOTE — Progress Notes (Signed)
Received Gina [name] this AM after her breakfast, she was compliant with her medications. She denied all of the psychiatric symptoms. She was laughing to the the extent of crying, and jumping from one subject to another. She has been OOB all morning in the milieu. She remains elated this pm and very talkative.

## 2017-11-14 NOTE — Plan of Care (Signed)
Pt. Reports doing, "much better definitely alright this evening". Pt. Attends groups and socializes in the milieu. Pt. Sleeping decent. Pt. Compliant with medications and unit procedures. Pt. Appropriate for the most part with staff and peers, but can be intrusive at times, though very polite. Pt. Denies SI/Hi and verbally is able to contract for safety. Pt. Reports she can remain safe while on the unit.    Problem: Education: Goal: Emotional status will improve Outcome: Progressing   Problem: Activity: Goal: Interest or engagement in activities will improve Outcome: Progressing Goal: Sleeping patterns will improve Outcome: Progressing   Problem: Health Behavior/Discharge Planning: Goal: Compliance with treatment plan for underlying cause of condition will improve Outcome: Progressing   Problem: Health Behavior/Discharge Planning: Goal: Compliance with therapeutic regimen will improve Outcome: Progressing   Problem: Role Relationship: Goal: Will demonstrate positive changes in social behaviors and relationships Outcome: Progressing   Problem: Safety: Goal: Ability to remain free from injury will improve Outcome: Progressing

## 2017-11-14 NOTE — BHH Group Notes (Signed)
[facility] Group Notes:  (Nursing/MHT/Case Management/Adjunct)  Date:  11/14/2017  Time:  9:50 PM  Type of Therapy:  Group Therapy  Participation Level:  Did Not Attend  Nehemiah Settle 11/14/2017, 9:50 PM

## 2017-11-15 LAB — GLUCOSE, CAPILLARY: GLUCOSE-CAPILLARY: 102 mg/dL — AB (ref 65–99)

## 2017-11-15 MED ORDER — POLYETHYLENE GLYCOL 3350 17 G PO PACK: 17.0000 g | PACK | Freq: Every day | ORAL | Status: DC | PRN

## 2017-11-15 NOTE — Progress Notes (Signed)
   11/15/17 1440  Clinical Encounter Type  Visited With Patient  Visit Type Initial (order received regarding patient confessions request)  Referral From Nurse  Consult/Referral To Chaplain   Chaplain responded to order request for 'confessions.'  Chaplain met with patient who inquired if chaplain was [employer].  Chaplain explained that she was not, that she belonged to the [employer].  Patient spoke of her [place] and that she can only confess to a priest.  Patient shared that she had not been to church in 6 years since she had moved from [place] and has health issues.  Patient spoke about how her 'brain works' and how she has difficulty retaining information.  She says that she has gone 'down hill' and how it started after she went to see a movie with her brother that had a pitchfork in it.  She stated that the pitchfork has a lot of power.  Conversation around her sister's health and how sister and patient came to live in [place].  Patient asked about chaplain's faith and belief which chaplain explained.  Patient again stated that she would need a priest for confession.  This chaplain to check in with unit chaplain during Monday morning report about possibility of finding a priest to visit patient.

## 2017-11-15 NOTE — Progress Notes (Signed)
[facility] MD Progress Note  11/15/2017 4:23 PM Gina [name]  MRN:  [id number] Subjective:   " not too good" Pt continue to be manic, with pressured speech, reportedly not having BM for 3 days , although eating well. Pt on colace, will add miralax, pt denying belly pain, N/V. Continue to report not able to focus. Denies SI/HI Principal Problem: <principal problem not specified> Diagnosis:   Patient Active Problem List   Diagnosis Date Noted  . Bipolar 1 disorder, manic, full remission ([place]) [F31.74] 11/13/2017  . Bipolar I disorder, current or most recent episode manic, with psychotic features ([place]) [F31.2] 11/04/2017  . Gallbladder polyp [K82.4] 10/21/2017  . Left shoulder pain [M25.512] 10/21/2017  . Benign neoplasm of ascending colon [D12.2]   . Bipolar affective disorder, current episode hypomanic ([place]) [F31.0] 04/08/2017  . Low back pain radiating to left lower extremity [M54.5, M79.605] 02/26/2017  . Acquired hypothyroidism [E03.9] 09/10/2016  . Thrombocytopenia ([place]) [D69.6] 06/15/2016  . Medicare annual wellness visit, subsequent [Z00.00] 06/04/2016  . Advanced care planning/counseling discussion [Z71.89] 06/04/2016  . External hemorrhoid [K64.4] 04/02/2016  . Slow transit constipation [K59.01] 01/04/2016  . Bipolar I disorder, most recent episode (or current) manic ([place]) [F31.10] 12/03/2015  . NAFLD (nonalcoholic fatty liver disease) [K76.0] 03/28/2015  . Gallbladder calculus without cholecystitis [K80.20] 03/28/2015  . Gout [M10.9] 03/27/2015  . HCV antibody positive [R76.8] 03/27/2015  . History of bladder cancer [Z85.51] 03/27/2015  . History of bundle branch block [Z86.79] 03/27/2015  . Genetic testing [Z13.79] 02/28/2015  . Loss of memory [R41.3] 09/04/2014  . Controlled type 2 diabetes mellitus with diabetic nephropathy ([place]) [E11.21] 06/07/2014  . Hyperlipidemia [E78.5]   . Anemia in CKD (chronic kidney disease) [N18.9, D63.1] 01/26/2013  . Personal history of  colonic adenomas and colon cancer [Z86.010] 11/11/2012  . Osteoarthritis of knee [M17.10] 10/02/2012  . OSA on CPAP [G47.33, Z99.89]   . CKD (chronic kidney disease) stage 3, GFR 30-59 ml/min (HCC) [N18.3]   . Severe obesity (BMI 35.0-39.9) with comorbidity ([place]) [E66.01]   . HTN (hypertension), benign [I10] 04/07/2012  . Hx of colon cancer, stage I [Z85.038] 08/05/2011   Total Time spent with patient: 30 minutes  Past Psychiatric History: no new info  Past Medical History:  Past Medical History:  Diagnosis Date  . Anemia in chronic kidney disease   . Bipolar depression [facility])    sees psychiatrist - psych admission 03/2015  . Bladder cancer ([place]) 1990's  . Blood transfusion without reported diagnosis 2009  . Cataract    left  . CKD (chronic kidney disease) stage 3, GFR 30-59 ml/min (HCC)   . Colon cancer ([place]) 1990's  . DJD (degenerative joint disease), lumbar    chronic lower back pain  . Enterocutaneous fistula 04/07/2012   completed PT 06/2012 ([facility])  . Family history of breast cancer   . Family history of colon cancer   . Family history of stomach cancer   . GERD (gastroesophageal reflux disease)   . History of bladder cancer 1997  . History of colon cancer    s/p surgery  . History of uterine cancer    s/p hysterectomy  . Hyperlipidemia   . Hypertension   . IDA (iron deficiency anemia) 01/2013   thought due to h/o polyps  . Obesity (BMI 30-39.9)   . OSA on CPAP    6cm H2O  . Personal history of colonic adenomas and colon cancer 11/11/2012  . Positive hepatitis C antibody test 09/2014  but negative confirmatory testing  . RBBB   . Uncontrolled type 2 diabetes mellitus with nephropathy ([place]) 06/07/2014   completed DSME 02/2016    Past Surgical History:  Procedure Laterality Date  . BREAST BIOPSY Right 01/2014   fibroadenoma  . COLONOSCOPY  07/2009  . COLONOSCOPY  11/2012   2 tubular adenomas, mild diverticulosis, pending genetic testing for Lynch syndrome  [name]) rpt 2 yrs  . COLONOSCOPY  02/2015   TA, diverticulosis, rpt 2 yrs [name])  . COLONOSCOPY WITH PROPOFOL N/A 06/09/2017   TAx1, rpt 2 yrs [name], [name], MD)  . DEXA  12/2009   WNL  . DOBUTAMINE STRESS ECHO  12/2009   no evidence of ischemia  . HERNIA REPAIR  02/04/12  . i & d abdominal wound  02/19/12  . LEFT OOPHORECTOMY  2005  . PARTIAL COLECTOMY  about 2008   for colon cancer  . PARTIAL HYSTERECTOMY  1981   uterine cancer, R ovary remains  . Reexploration of abdominal wound and Allograft placemet  02/26/12  . Removal of infected mesh and abdominal wound vac placement  02/24/12  . sleep study  02/2014   OSA - AHI 55, nadir 81% [name])   Family History:  Family History  Problem Relation Age of Onset  . Colon cancer Mother 72  . Stomach cancer Mother        dx in her 80s?  . CAD Father        MI; deceased 61  . Mental illness Sister        anxiety/depression  . Thyroid disease Sister   . Breast cancer Maternal Grandmother        age at dx unknown  . Diabetes Brother   . Diabetes Brother   . Diabetes Other        aunts and uncles both sides  . Arthritis Other        strong fmhx  . Colon cancer Other 67       maternal half-sister; deceased   Family Psychiatric  History: no new info Social History:  Social History   Substance and Sexual Activity  Alcohol Use No  . Alcohol/week: 0.0 oz     Social History   Substance and Sexual Activity  Drug Use No    Social History   Socioeconomic History  . Marital status: Single    Spouse name: Not on file  . Number of children: 0  . Years of education: Not on file  . Highest education level: Not on file  Occupational History  . Not on file  Social Needs  . Financial resource strain: Not on file  . Food insecurity:    Worry: Not on file    Inability: Not on file  . Transportation needs:    Medical: Not on file    Non-medical: Not on file  Tobacco Use  . Smoking status: Never Smoker  . Smokeless tobacco:  Never Used  Substance and Sexual Activity  . Alcohol use: No    Alcohol/week: 0.0 oz  . Drug use: No  . Sexual activity: Never  Lifestyle  . Physical activity:    Days per week: Not on file    Minutes per session: Not on file  . Stress: Not on file  Relationships  . Social connections:    Talks on phone: Not on file    Gets together: Not on file    Attends religious service: Not on file    Active member of club  or organization: Not on file    Attends meetings of clubs or organizations: Not on file    Relationship status: Not on file  Other Topics Concern  . Not on file  Social History Narrative   Lives with sister, no pets   Occupation: disabled, for bipolar and arthritis   Edu: GED   Activity: take walks   Diet: good water, vegetables daily   Religion: [employer], Dr. Jimmye Norman (ph [phone])   Additional Social History:                         Sleep: Good  Appetite:  Fair  Current Medications: Current Facility-Administered Medications  Medication Dose Route Frequency Provider Last Rate Last Dose  . acetaminophen (TYLENOL) tablet 650 mg  650 mg Oral Q6H PRN Clapacs, Madie Reno, MD   650 mg at 11/14/17 1659  . alum & mag hydroxide-simeth (MAALOX/MYLANTA) 200-200-20 MG/5ML suspension 30 mL  30 mL Oral Q4H PRN Clapacs, John T, MD      . divalproex (DEPAKOTE) DR tablet 500 mg  500 mg Oral Q12H Clapacs, Madie Reno, MD   500 mg at 11/15/17 0809  . docusate sodium (COLACE) capsule 100 mg  100 mg Oral BID Clapacs, John T, MD   100 mg at 11/15/17 0810  . etodolac (LODINE) capsule 200 mg  200 mg Oral BID Clapacs, Madie Reno, MD   200 mg at 11/15/17 0809  . hydrOXYzine (ATARAX/VISTARIL) tablet 25 mg  25 mg Oral TID PRN Clapacs, John T, MD      . lamoTRIgine (LAMICTAL) tablet 25 mg  25 mg Oral Daily Clapacs, Madie Reno, MD   25 mg at 11/15/17 0809  . levothyroxine (SYNTHROID, LEVOTHROID) tablet 50 mcg  50 mcg Oral QAC breakfast Pucilowska, Jolanta B,  MD   50 mcg at 11/15/17 [phone]  . losartan (COZAAR) tablet 50 mg  50 mg Oral Daily Clapacs, Madie Reno, MD   50 mg at 11/15/17 0809  . magnesium hydroxide (MILK OF MAGNESIA) suspension 30 mL  30 mL Oral Daily PRN Clapacs, John T, MD      . metoprolol tartrate (LOPRESSOR) tablet 12.5 mg  12.5 mg Oral BID Clapacs, John T, MD   12.5 mg at 11/15/17 0809  . polyethylene glycol (MIRALAX / GLYCOLAX) packet 17 g  17 g Oral Daily PRN Lenward Chancellor, MD      . QUEtiapine (SEROQUEL) tablet 300 mg  300 mg Oral QHS Clapacs, Madie Reno, MD   300 mg at 11/14/17 2127  . simvastatin (ZOCOR) tablet 20 mg  20 mg Oral q1800 Clapacs, Madie Reno, MD   20 mg at 11/14/17 1703  . traZODone (DESYREL) tablet 100 mg  100 mg Oral QHS Lenward Chancellor, MD   100 mg at 11/14/17 2127    Lab Results:  Results for orders placed or performed during the hospital encounter of 11/13/17 (from the past 48 hour(s))  Glucose, capillary     Status: None   Collection Time: 11/14/17  6:03 AM  Result Value Ref Range   Glucose-Capillary 96 65 - 99 mg/dL  Hemoglobin A1c     Status: Abnormal   Collection Time: 11/14/17  7:00 AM  Result Value Ref Range   Hgb A1c MFr Bld 5.9 (H) 4.8 - 5.6 %    Comment: (NOTE) Pre diabetes:          5.7%-6.4% Diabetes:              >  6.4% Glycemic control for   <7.0% adults with diabetes    Mean Plasma Glucose 122.63 mg/dL    Comment: Performed at [facility] [address], [place], [place] [address]  Lipid panel     Status: Abnormal   Collection Time: 11/14/17  7:00 AM  Result Value Ref Range   Cholesterol 140 0 - 200 mg/dL   Triglycerides 152 (H) <150 mg/dL   HDL 50 >40 mg/dL   Total CHOL/HDL Ratio 2.8 RATIO   VLDL 30 0 - 40 mg/dL   LDL Cholesterol 60 0 - 99 mg/dL    Comment:        Total Cholesterol/HDL:CHD Risk Coronary Heart Disease Risk Table                     Men   Women  1/2 Average Risk   3.4   3.3  Average Risk       5.0   4.4  2 X Average Risk   9.6   7.1  3 X Average Risk  23.4    11.0        Use the calculated Patient Ratio above and the CHD Risk Table to determine the patient's CHD Risk.        ATP III CLASSIFICATION (LDL):  <100     mg/dL   Optimal  100-129  mg/dL   Near or Above                    Optimal  130-159  mg/dL   Borderline  160-189  mg/dL   High  >190     mg/dL   Very High Performed at [facility], [address] [place], [place] [address]   TSH     Status: Abnormal   Collection Time: 11/14/17  7:00 AM  Result Value Ref Range   TSH 5.852 (H) 0.350 - 4.500 uIU/mL    Comment: Performed by a 3rd Generation assay with a functional sensitivity of <=0.01 uIU/mL. Performed at [facility], [address] [place],  [address]   Glucose, capillary     Status: Abnormal   Collection Time: 11/15/17  6:35 AM  Result Value Ref Range   Glucose-Capillary 102 (H) 65 - 99 mg/dL    Blood Alcohol level:  Lab Results  Component Value Date   ETH <10 11/13/2017   ETH <10 60/73/7106    Metabolic Disorder Labs: Lab Results  Component Value Date   HGBA1C 5.9 (H) 11/14/2017   MPG 122.63 11/14/2017   MPG 114 04/14/2012   Lab Results  Component Value Date   PROLACTIN 33.6 (H) 12/04/2015   Lab Results  Component Value Date   CHOL 140 11/14/2017   TRIG 152 (H) 11/14/2017   HDL 50 11/14/2017   CHOLHDL 2.8 11/14/2017   VLDL 30 11/14/2017   LDLCALC 60 11/14/2017   LDLCALC 81 09/30/2017    Physical Findings: AIMS: Facial and Oral Movements Muscles of Facial Expression: None, normal Lips and Perioral Area: None, normal Jaw: None, normal Tongue: None, normal,Extremity Movements Upper (arms, wrists, hands, fingers): None, normal Lower (legs, knees, ankles, toes): None, normal, Trunk Movements Neck, shoulders, hips: None, normal, Overall Severity Severity of abnormal movements (highest score from questions above): None, normal Incapacitation due to abnormal movements: None, normal Patient's awareness of abnormal  movements (rate only patient's report): No Awareness, Dental Status Current problems with teeth and/or dentures?: No Does patient usually wear dentures?: No  CIWA:    COWS:     Musculoskeletal: Strength & Muscle Tone: within normal limits Gait & Station: normal Patient leans:   Psychiatric Specialty Exam: Physical Exam  Nursing note and vitals reviewed.   ROS  Blood pressure 121/71, pulse 84, temperature 97.9 F (36.6 C), temperature source Oral, resp. rate 18, height 5\' 2"  (1.575 m), weight 87.1 kg (192 lb), SpO2 98 %.Body mass index is 35.12 kg/m.  General Appearance: Disheveled, obese  Eye Contact:  Fair  Speech:  Pressured  Volume:  normal  Mood:  Anxious   Affect:  Labile  Thought Process:  Disorganized  Orientation:  ox3  Thought Content:  constipation  Suicidal Thoughts:  No  Homicidal Thoughts:  No  Memory:  intact  Judgement:  Impaired  Insight:  Shallow  Psychomotor Activity:  Increased  Concentration:  easily distractible  Recall:  Good  Fund of Knowledge:  Good  Language:  Good  Akathisia:  No  Handed:    AIMS (if indicated):     Assets:  Communication Skills  ADL's:  Intact  Cognition:  Unable to focus  Sleep:  Number of Hours: 6.45        Treatment Plan Summary: Pt continue to be manic, pt having constipation. Depakote level -pending.  Cont current psych meds, add miralax for constipation, cont colace.   Lenward Chancellor, MD 11/15/2017, 4:23 PM

## 2017-11-15 NOTE — Plan of Care (Signed)
Pt. Less pressured and tangential this evening. Pt. Participates in unit activities and groups. Pt. Sleeping good per sleep reports. Pt. Compliant with medications. Pt. Denies SI/HI. Contracts verbally for safety. Reports she can remain safe while on the unit.    Problem: Education: Goal: Mental status will improve Outcome: Progressing   Problem: Activity: Goal: Interest or engagement in activities will improve Outcome: Progressing Goal: Sleeping patterns will improve Outcome: Progressing   Problem: Health Behavior/Discharge Planning: Goal: Compliance with treatment plan for underlying cause of condition will improve Outcome: Progressing   Problem: Health Behavior/Discharge Planning: Goal: Compliance with therapeutic regimen will improve Outcome: Progressing   Problem: Safety: Goal: Ability to remain free from injury will improve Outcome: Progressing

## 2017-11-15 NOTE — Progress Notes (Signed)
Received Gina [name] this AM after breakfast, she was compliant with her medications. She verbalized feeling better today  with an increased concentration. She feels depressed and anxious related to this hospitalization and her current personal situation. She is OOB most of the day with her peers in the dayroom. She talked with the Chaplain, but requested a [name] for her sins.

## 2017-11-15 NOTE — [facility] Group Notes (Signed)
LCSW Group Therapy Note 11/15/2017 1:00pm Type of Therapy and Topic:  Group Therapy:  Communication Participation Level:  Minimal  Description of Group: Patients will identify how individuals communicate with one another appropriately and inappropriately.  Patients will be guided to discuss their thoughts, feelings and behaviors related to barriers when communicating.  The group will process together ways to execute positive and appropriate communication with attention given to how one uses behavior, tone and body language.  Patients will be encouraged to reflect on a situation where they were successfully able to communicate and what made this example successful.  Group will identify specific changes they are motivated to make in order to overcome communication barriers with self, peers, authority, and parents.  This group will be process-oriented with patients participating in exploration of their own experiences, giving and receiving support, and challenging self and other group members.   Therapeutic Goals 1. Patient will express an understanding of the four types of verbal communication (passive, aggressive, passive aggressive, and assertive).  2. Patient will identify feelings (such as fear or worry), thought process and behaviors related to why people internalize feelings rather than express self openly. 3. Patient will identify their main communication style, and report how they plan to change their style to assertive. 4. Members will then practice through role play how to communicate using I statements, I feel statements, and acknowledging feelings rather than displacing feelings on others Summary of Patient Progress: Pt continues to work towards their tx goals but has not yet reached them. Pt was able to appropriately participate in group discussion, and was able to offer support/validation to other group members. Pt reported she is feeling, "okay. I slept a lot better yesterday." Pt reported  she feels she communicates well and tries to, "laugh a lot."  Therapeutic Modalities Cognitive Behavioral Therapy Motivational Interviewing Solution Focused Therapy  Alden Hipp, MSW, LCSW Clinical Social Worker 11/15/2017 1:53 PM

## 2017-11-15 NOTE — Progress Notes (Signed)
D:Pt denies SI/HI/AVH. Pt is pleasant and cooperative, less tangential and pressured. Pt. Interactions appropriate, frequently observed socializing with peers and working on crossword puzzles. Pt.has no Complaints this evening and reports, doing "good, just one day at a time". Pt. Attends groups and socializes in the milieu with staff and peers often and is polite.   A: Q x 15 minute observation checks were completed for safety. Patient was provided with education. Patient was given scheduled medications. Patient was encourage to attend groups, participate in unit activities and continue with plan of care.   R:Patient is complaint with medication and unit procedures. Pt. Attends groups.             Precautionary checks every 15 minutes for safety maintained, room free of safety hazards, patient sustains no injury or falls during this shift.

## 2017-11-16 ENCOUNTER — Encounter: Payer: Self-pay | Admitting: Psychiatry

## 2017-11-16 DIAGNOSIS — F3164 Bipolar disorder, current episode mixed, severe, with psychotic features: Principal | ICD-10-CM

## 2017-11-16 LAB — VALPROIC ACID LEVEL: Valproic Acid Lvl: 67 ug/mL (ref 50.0–100.0)

## 2017-11-16 LAB — VALPROIC ACID LEVEL, FREE: Valproic Acid, Free: 17 ug/mL (ref 6.0–22.0)

## 2017-11-16 MED ORDER — QUETIAPINE FUMARATE 200 MG PO TABS
400.0000 mg | ORAL_TABLET | Freq: Every day | ORAL | Status: DC
  Administered 2017-11-16 – 2017-11-22 (×7): 400 mg via ORAL
  Filled 2017-11-16 (×7): qty 2

## 2017-11-16 MED ORDER — POLYETHYLENE GLYCOL 3350 17 G PO PACK
17.0000 g | PACK | Freq: Every day | ORAL | Status: DC
  Administered 2017-11-16 – 2017-11-23 (×5): 17 g via ORAL
  Filled 2017-11-16 (×7): qty 1

## 2017-11-16 MED ORDER — MAGNESIUM CITRATE PO SOLN
1.0000 | Freq: Once | ORAL | Status: AC
  Administered 2017-11-20: 1 via ORAL
  Filled 2017-11-16: qty 296

## 2017-11-16 MED ORDER — PALIPERIDONE PALMITATE 234 MG/1.5ML IM SUSP: 234.0000 mg | INTRAMUSCULAR | Status: DC

## 2017-11-16 MED ORDER — LAMOTRIGINE 25 MG PO TABS
50.0000 mg | ORAL_TABLET | Freq: Two times a day (BID) | ORAL | Status: DC
  Administered 2017-11-16 – 2017-11-19 (×6): 50 mg via ORAL
  Filled 2017-11-16 (×6): qty 2

## 2017-11-16 NOTE — Progress Notes (Signed)
Patient ID: Jaleea Alesi, female   DOB: [birth year], 67 y.o.   MRN: [id number] [place] spoke with her ACTT team nurse who confirms that PT is on 234mg  of Kirt Boys, last given 4/11 and will be due again on 5/9.  She agreed to fax [facility] to unit fax.  CSW also notified them of Pt's request for clothing.  They agreed to follow up with team lead and let me know.  Dossie Arbour, LCSW

## 2017-11-16 NOTE — Plan of Care (Signed)
Patient has the ability to cope and function at an adequate level and has shown some interest in activities by attending/participating in unit groups and going outside to the courtyard staff and other members on the unit without any issues thus far.  Patient denies SI/HI/AVH stating to this writer "I don't want to hurt nobody, I want tot go to heaven". Patient also denies signs and symptoms of anxiety, however she does endorse a depression level of "8/10" stating that she feels this way because of "when I loss my parents, we were a close family, and my mom always prayed for me". Patient stated that she slept "not too bad, in and out because of my knee pain" last night. Patient has the ability to identify the available resources that can assist her in meeting her health care needs, but has not voiced any to this writer as of yet because "me and my sister don't know this area". Patient has been in compliance with her therapeutic regimen and all questions and concerns have been addressed and answered. Patient has demonstrated positive changes in social behaviors/relationships. Patient has remained free from injury thus far on the unit and remains safe at this time.  Problem: Spiritual Needs Goal: Ability to function at adequate level Outcome: Progressing   Problem: Education: Goal: Emotional status will improve Outcome: Progressing Goal: Mental status will improve Outcome: Progressing   Problem: Activity: Goal: Interest or engagement in activities will improve Outcome: Progressing Goal: Sleeping patterns will improve Outcome: Progressing   Problem: Health Behavior/Discharge Planning: Goal: Identification of resources available to assist in meeting health care needs will improve Outcome: Progressing Goal: Compliance with treatment plan for underlying cause of condition will improve Outcome: Progressing   Problem: Education: Goal: Utilization of techniques to improve thought processes will  improve Outcome: Progressing   Problem: Coping: Goal: Coping ability will improve Outcome: Progressing Goal: Will verbalize feelings Outcome: Progressing   Problem: Health Behavior/Discharge Planning: Goal: Compliance with therapeutic regimen will improve Outcome: Progressing   Problem: Role Relationship: Goal: Will demonstrate positive changes in social behaviors and relationships Outcome: Progressing   Problem: Self-Concept: Goal: Will verbalize positive feelings about self Outcome: Progressing Goal: Level of anxiety will decrease Outcome: Progressing   Problem: Safety: Goal: Ability to remain free from injury will improve Outcome: Progressing

## 2017-11-16 NOTE — Progress Notes (Signed)
Recreation Therapy Notes  Date: 11/16/2017  Time: 9:30 am  Location: Craft Room  Behavioral response: Appropriate  Intervention Topic: Values  Discussion/Intervention:  Group content today was focused on values. The group identified what values are and where they come from. Individuals expressed some values and how many they have. Patients described how they go about add or removing values. The group described the importance of having values and how they go about using them in daily life. Patient participated in the intervention "My Values" where they were able to pick out values that were important to them and make a visual aide.   Clinical Observations/Feedback:  Patient came to group and identified money as an important value to her. She expressed that helping and caring for others are others values that are important to her.Individual was social with peers and staff during group while participating in the intervention.   Marianne Golightly LRT/CTRS         Zhania Shaheen 11/16/2017 12:25 PM

## 2017-11-16 NOTE — Tx Team (Addendum)
Interdisciplinary Treatment and Diagnostic Plan Update  11/16/2017 Time of Session: 10:30am Gina [name] MRN: [id number]  Principal Diagnosis: Bipolar I disorder, most recent episode mixed, severe with psychotic features ([place])  Secondary Diagnoses: Principal Problem:   Bipolar I disorder, most recent episode mixed, severe with psychotic features ([place]) Active Problems:   HTN (hypertension), benign   Severe obesity (BMI 35.0-39.9) with comorbidity ([place])   Osteoarthritis of knee   Hyperlipidemia   Controlled type 2 diabetes mellitus with diabetic nephropathy (HCC)   Acquired hypothyroidism   Loss of memory   Current Medications:  Current Facility-Administered Medications  Medication Dose Route Frequency Provider Last Rate Last Dose  . acetaminophen (TYLENOL) tablet 650 mg  650 mg Oral Q6H PRN Clapacs, Madie Reno, MD   650 mg at 11/16/17 0012  . alum & mag hydroxide-simeth (MAALOX/MYLANTA) 200-200-20 MG/5ML suspension 30 mL  30 mL Oral Q4H PRN Clapacs, John T, MD      . divalproex (DEPAKOTE) DR tablet 500 mg  500 mg Oral Q12H Clapacs, Madie Reno, MD   500 mg at 11/16/17 0846  . docusate sodium (COLACE) capsule 100 mg  100 mg Oral BID Clapacs, Madie Reno, MD   100 mg at 11/16/17 0846  . etodolac (LODINE) capsule 200 mg  200 mg Oral BID Clapacs, John T, MD   200 mg at 11/16/17 0846  . hydrOXYzine (ATARAX/VISTARIL) tablet 25 mg  25 mg Oral TID PRN Clapacs, John T, MD      . lamoTRIgine (LAMICTAL) tablet 50 mg  50 mg Oral BID Pucilowska, Jolanta B, MD      . levothyroxine (SYNTHROID, LEVOTHROID) tablet 50 mcg  50 mcg Oral QAC breakfast Pucilowska, Jolanta B, MD   50 mcg at 11/16/17 0846  . losartan (COZAAR) tablet 50 mg  50 mg Oral Daily Clapacs, Madie Reno, MD   50 mg at 11/16/17 0846  . magnesium citrate solution 1 Bottle  1 Bottle Oral Once Pucilowska, Jolanta B, MD      . magnesium hydroxide (MILK OF MAGNESIA) suspension 30 mL  30 mL Oral Daily PRN Clapacs, John T, MD      . metoprolol  tartrate (LOPRESSOR) tablet 12.5 mg  12.5 mg Oral BID Clapacs, John T, MD   12.5 mg at 11/16/17 0846  . polyethylene glycol (MIRALAX / GLYCOLAX) packet 17 g  17 g Oral Daily Pucilowska, Jolanta B, MD   17 g at 11/16/17 0848  . QUEtiapine (SEROQUEL) tablet 400 mg  400 mg Oral QHS Pucilowska, Jolanta B, MD      . simvastatin (ZOCOR) tablet 20 mg  20 mg Oral q1800 Clapacs, Madie Reno, MD   20 mg at 11/15/17 1702  . traZODone (DESYREL) tablet 100 mg  100 mg Oral QHS Lenward Chancellor, MD   100 mg at 11/15/17 2118   PTA Medications: Medications Prior to Admission  Medication Sig Dispense Refill Last Dose  . ACCU-CHEK AVIVA PLUS test strip 1 each by Other route daily. E11.21 100 each 3 Taking  . divalproex (DEPAKOTE) 500 MG DR tablet Take 1 tablet (500 mg total) by mouth 2 (two) times daily with a meal. 60 tablet 3 Taking  . docusate sodium (COLACE) 100 MG capsule Take 1 capsule (100 mg total) by mouth 2 (two) times daily. 30 capsule 1   . etodolac (LODINE) 200 MG capsule Take 1 capsule (200 mg total) by mouth 2 (two) times daily. 60 capsule 3   . lamoTRIgine (LAMICTAL) 25 MG tablet Take 25 mg  by mouth daily.   Taking  . levothyroxine (SYNTHROID, LEVOTHROID) 50 MCG tablet Take 1 tablet (50 mcg total) by mouth daily before breakfast. 30 tablet 11   . losartan (COZAAR) 50 MG tablet Take 1 tablet (50 mg total) by mouth daily. 30 tablet 11 Taking  . MELATONIN PO Take 1 tablet by mouth at bedtime.   Taking  . metFORMIN (GLUCOPHAGE) 500 MG tablet Take 1 tablet (500 mg total) by mouth daily with breakfast. 30 tablet 6 Taking  . metoprolol tartrate (LOPRESSOR) 25 MG tablet TAKE 1 & 1/2 TABLET BY MOUTH TWICE DAILY WITH FOOD 84 tablet 5 Taking  . nystatin (MYCOSTATIN/NYSTOP) powder Apply topically 3 (three) times daily. 60 g 1 Taking  . paliperidone (INVEGA SUSTENNA) 234 MG/1.5ML SUSP injection Inject 234 mg into the muscle every 30 (thirty) days.   Taking  . polyethylene glycol powder (GLYCOLAX/MIRALAX) powder  TAKE 17 GRAMS BY MOUTH TWICE DAILY AS NEEDED FOR MODERATE CONSTIPATION. 527 g 3 Taking  . QUEtiapine (SEROQUEL) 300 MG tablet Take 1 tablet (300 mg total) by mouth at bedtime. 30 tablet 1   . senna (SENOKOT) 8.6 MG TABS tablet Take 1 tablet (8.6 mg total) by mouth at bedtime as needed for mild constipation. 120 each 0   . simvastatin (ZOCOR) 20 MG tablet Take 1 tablet (20 mg total) by mouth at bedtime. 30 tablet 11 Taking    Patient Stressors: Loss of parents Other: support  Patient Strengths: Ability for insight Communication skills Motivation for treatment/growth Religious Affiliation  Treatment Modalities: Medication Management, Group therapy, Case management,  1 to 1 session with clinician, Psychoeducation, Recreational therapy.   Physician Treatment Plan for Primary Diagnosis: Bipolar I disorder, most recent episode mixed, severe with psychotic features ([place]) Long Term Goal(s): Improvement in symptoms so as ready for discharge Improvement in symptoms so as ready for discharge   Short Term Goals: Ability to identify changes in lifestyle to reduce recurrence of condition will improve Ability to verbalize feelings will improve Ability to disclose and discuss suicidal ideas Ability to demonstrate self-control will improve Ability to identify and develop effective coping behaviors will improve Ability to maintain clinical measurements within normal limits will improve Compliance with prescribed medications will improve Ability to identify triggers associated with substance abuse/mental health issues will improve Ability to identify changes in lifestyle to reduce recurrence of condition will improve Ability to verbalize feelings will improve Ability to disclose and discuss suicidal ideas Ability to demonstrate self-control will improve Ability to identify and develop effective coping behaviors will improve Ability to maintain clinical measurements within normal limits will  improve Compliance with prescribed medications will improve Ability to identify triggers associated with substance abuse/mental health issues will improve  Medication Management: Evaluate patient's response, side effects, and tolerance of medication regimen.  Therapeutic Interventions: 1 to 1 sessions, Unit Group sessions and Medication administration.  Evaluation of Outcomes: Progressing  Physician Treatment Plan for Secondary Diagnosis: Principal Problem:   Bipolar I disorder, most recent episode mixed, severe with psychotic features ([place]) Active Problems:   HTN (hypertension), benign   Severe obesity (BMI 35.0-39.9) with comorbidity ([place])   Osteoarthritis of knee   Hyperlipidemia   Controlled type 2 diabetes mellitus with diabetic nephropathy (H[facility])   Acquired hypothyroidism   Loss of memory  Long Term Goal(s): Improvement in symptoms so as ready for discharge Improvement in symptoms so as ready for discharge   Short Term Goals: Ability to identify changes in lifestyle to reduce recurrence of condition will  improve Ability to verbalize feelings will improve Ability to disclose and discuss suicidal ideas Ability to demonstrate self-control will improve Ability to identify and develop effective coping behaviors will improve Ability to maintain clinical measurements within normal limits will improve Compliance with prescribed medications will improve Ability to identify triggers associated with substance abuse/mental health issues will improve Ability to identify changes in lifestyle to reduce recurrence of condition will improve Ability to verbalize feelings will improve Ability to disclose and discuss suicidal ideas Ability to demonstrate self-control will improve Ability to identify and develop effective coping behaviors will improve Ability to maintain clinical measurements within normal limits will improve Compliance with prescribed medications will improve Ability to  identify triggers associated with substance abuse/mental health issues will improve     Medication Management: Evaluate patient's response, side effects, and tolerance of medication regimen.  Therapeutic Interventions: 1 to 1 sessions, Unit Group sessions and Medication administration.  Evaluation of Outcomes: Progressing   RN Treatment Plan for Primary Diagnosis: Bipolar I disorder, most recent episode mixed, severe with psychotic features ([place]) Long Term Goal(s): Knowledge of disease and therapeutic regimen to maintain health will improve  Short Term Goals: Ability to identify and develop effective coping behaviors will improve and Compliance with prescribed medications will improve  Medication Management: RN will administer medications as ordered by provider, will assess and evaluate patient's response and provide education to patient for prescribed medication. RN will report any adverse and/or side effects to prescribing provider.  Therapeutic Interventions: 1 on 1 counseling sessions, Psychoeducation, Medication administration, Evaluate responses to treatment, Monitor vital signs and CBGs as ordered, Perform/monitor CIWA, COWS, AIMS and Fall Risk screenings as ordered, Perform wound care treatments as ordered.  Evaluation of Outcomes: Progressing   LCSW Treatment Plan for Primary Diagnosis: Bipolar I disorder, most recent episode mixed, severe with psychotic features ([place]) Long Term Goal(s): Safe transition to appropriate next level of care at discharge, Engage patient in therapeutic group addressing interpersonal concerns.  Short Term Goals: Engage patient in aftercare planning with referrals and resources, Identify triggers associated with mental health/substance abuse issues and Increase skills for wellness and recovery  Therapeutic Interventions: Assess for all discharge needs, 1 to 1 time with Social worker, Explore available resources and support systems, Assess for adequacy in  community support network, Educate family and significant other(s) on suicide prevention, Complete Psychosocial Assessment, Interpersonal group therapy.  Evaluation of Outcomes: Progressing   Progress in Treatment: Attending groups: Yes. Participating in groups: Yes. Taking medication as prescribed: Yes. Toleration medication: Yes. Family/Significant other contact made: No, will contact:  sister Patient understands diagnosis: Yes. Discussing patient identified problems/goals with staff: Yes. Medical problems stabilized or resolved: Yes. Denies suicidal/homicidal ideation: Yes. Issues/concerns per patient self-inventory: No. Other:    New problem(s) identified: No, Describe:     New Short Term/Long Term Goal(s): to get feeling better, to get rid of headache. She says she would like to have her hat as she believes this makes her feel better.  Discharge Plan or Barriers: Home with her sister and follow up with [facility] ACTT team  Reason for Continuation of Hospitalization: [place] Medication stabilization  Estimated Length of Stay: 5-7 days  Recreational Therapy: Patient Stressors: Could not sleep Patient Goal: Patient will focus on task/topic with 2 prompts from staff within 5 recreation therapy group sessions  Attendees: Patient:Gina [name] 11/16/2017 5:17 PM  Physician: Herma Ard Pucilowska,MD 11/16/2017 5:17 PM  Nursing: Polly Cobia, RN 11/16/2017 5:17 PM  RN Care Manager: 11/16/2017 5:17 PM  Social Worker: Dossie Arbour, LCSW 11/16/2017 5:17 PM  Recreational Therapist: Roanna Epley, LRT 11/16/2017 5:17 PM  Other:  11/16/2017 5:17 PM  Other:  11/16/2017 5:17 PM  Other: 11/16/2017 5:17 PM    Scribe for Treatment Team: August Saucer, LCSW 11/16/2017 5:17 PM

## 2017-11-16 NOTE — Progress Notes (Signed)
[facility] MD Progress Note  11/16/2017 8:24 AM Korrine Sicard  MRN:  [id number]  Subjective:    [name] is very talkative and intrusive. She reports that she has been crying and laughing at the same time and see "Satan with a pitch fork" again. She is religiously preoccupied wanting to talk about her Catholicism. Slept better last night, appetite is "good thanks God". No problems with medications. Still did not have a bowel movement.  Principal Problem: Bipolar I disorder, most recent episode mixed, severe with psychotic features ([place]) Diagnosis:   Patient Active Problem List   Diagnosis Date Noted  . Bipolar I disorder, most recent episode mixed, severe with psychotic features ([place]) [F31.64] 11/13/2017    Priority: High  . Bipolar I disorder, current or most recent episode manic, with psychotic features ([place]) [F31.2] 11/04/2017    Priority: High  . Gallbladder polyp [K82.4] 10/21/2017  . Left shoulder pain [M25.512] 10/21/2017  . Benign neoplasm of ascending colon [D12.2]   . Bipolar affective disorder, current episode hypomanic ([place]) [F31.0] 04/08/2017  . Low back pain radiating to left lower extremity [M54.5, M79.605] 02/26/2017  . Acquired hypothyroidism [E03.9] 09/10/2016  . Thrombocytopenia ([place]) [D69.6] 06/15/2016  . Medicare annual wellness visit, subsequent [Z00.00] 06/04/2016  . Advanced care planning/counseling discussion [Z71.89] 06/04/2016  . External hemorrhoid [K64.4] 04/02/2016  . Slow transit constipation [K59.01] 01/04/2016  . Bipolar I disorder, most recent episode (or current) manic ([place]) [F31.10] 12/03/2015  . NAFLD (nonalcoholic fatty liver disease) [K76.0] 03/28/2015  . Gallbladder calculus without cholecystitis [K80.20] 03/28/2015  . Gout [M10.9] 03/27/2015  . HCV antibody positive [R76.8] 03/27/2015  . History of bladder cancer [Z85.51] 03/27/2015  . History of bundle branch block [Z86.79] 03/27/2015  . Genetic testing [Z13.79] 02/28/2015  . Loss  of memory [R41.3] 09/04/2014  . Controlled type 2 diabetes mellitus with diabetic nephropathy ([place]) [E11.21] 06/07/2014  . Hyperlipidemia [E78.5]   . Anemia in CKD (chronic kidney disease) [N18.9, D63.1] 01/26/2013  . Personal history of colonic adenomas and colon cancer [Z86.010] 11/11/2012  . Osteoarthritis of knee [M17.10] 10/02/2012  . OSA on CPAP [G47.33, Z99.89]   . CKD (chronic kidney disease) stage 3, GFR 30-59 ml/min (HCC) [N18.3]   . Severe obesity (BMI 35.0-39.9) with comorbidity ([place]) [E66.01]   . HTN (hypertension), benign [I10] 04/07/2012  . Hx of colon cancer, stage I [Z85.038] 08/05/2011   Total Time spent with patient: 20 minutes  Past Psychiatric History: bipolar disorder  Past Medical History:  Past Medical History:  Diagnosis Date  . Anemia in chronic kidney disease   . Bipolar depression [facility])    sees psychiatrist - psych admission 03/2015  . Bladder cancer ([place]) 1990's  . Blood transfusion without reported diagnosis 2009  . Cataract    left  . CKD (chronic kidney disease) stage 3, GFR 30-59 ml/min (HCC)   . Colon cancer ([place]) 1990's  . DJD (degenerative joint disease), lumbar    chronic lower back pain  . Enterocutaneous fistula 04/07/2012   completed PT 06/2012 ([facility])  . Family history of breast cancer   . Family history of colon cancer   . Family history of stomach cancer   . GERD (gastroesophageal reflux disease)   . History of bladder cancer 1997  . History of colon cancer    s/p surgery  . History of uterine cancer    s/p hysterectomy  . Hyperlipidemia   . Hypertension   . IDA (iron deficiency anemia) 01/2013   thought due  to h/o polyps  . Obesity (BMI 30-39.9)   . OSA on CPAP    6cm H2O  . Personal history of colonic adenomas and colon cancer 11/11/2012  . Positive hepatitis C antibody test 09/2014   but negative confirmatory testing  . RBBB   . Uncontrolled type 2 diabetes mellitus with nephropathy ([place]) 06/07/2014   completed DSME  02/2016    Past Surgical History:  Procedure Laterality Date  . BREAST BIOPSY Right 01/2014   fibroadenoma  . COLONOSCOPY  07/2009  . COLONOSCOPY  11/2012   2 tubular adenomas, mild diverticulosis, pending genetic testing for Lynch syndrome [name]) rpt 2 yrs  . COLONOSCOPY  02/2015   TA, diverticulosis, rpt 2 yrs [name])  . COLONOSCOPY WITH PROPOFOL N/A 06/09/2017   TAx1, rpt 2 yrs [name], [name], MD)  . DEXA  12/2009   WNL  . DOBUTAMINE STRESS ECHO  12/2009   no evidence of ischemia  . HERNIA REPAIR  02/04/12  . i & d abdominal wound  02/19/12  . LEFT OOPHORECTOMY  2005  . PARTIAL COLECTOMY  about 2008   for colon cancer  . PARTIAL HYSTERECTOMY  1981   uterine cancer, R ovary remains  . Reexploration of abdominal wound and Allograft placemet  02/26/12  . Removal of infected mesh and abdominal wound vac placement  02/24/12  . sleep study  02/2014   OSA - AHI 55, nadir 81% [name])   Family History:  Family History  Problem Relation Age of Onset  . Colon cancer Mother 15  . Stomach cancer Mother        dx in her 83s?  . CAD Father        MI; deceased 59  . Mental illness Sister        anxiety/depression  . Thyroid disease Sister   . Breast cancer Maternal Grandmother        age at dx unknown  . Diabetes Brother   . Diabetes Brother   . Diabetes Other        aunts and uncles both sides  . Arthritis Other        strong fmhx  . Colon cancer Other 22       maternal half-sister; deceased   Family Psychiatric  History: sister with developmental disability Social History:  Social History   Substance and Sexual Activity  Alcohol Use No  . Alcohol/week: 0.0 oz     Social History   Substance and Sexual Activity  Drug Use No    Social History   Socioeconomic History  . Marital status: Single    Spouse name: Not on file  . Number of children: 0  . Years of education: Not on file  . Highest education level: Not on file  Occupational History  . Not on file  Social  Needs  . Financial resource strain: Not on file  . Food insecurity:    Worry: Not on file    Inability: Not on file  . Transportation needs:    Medical: Not on file    Non-medical: Not on file  Tobacco Use  . Smoking status: Never Smoker  . Smokeless tobacco: Never Used  Substance and Sexual Activity  . Alcohol use: No    Alcohol/week: 0.0 oz  . Drug use: No  . Sexual activity: Never  Lifestyle  . Physical activity:    Days per week: Not on file    Minutes per session: Not on file  . Stress: Not  on file  Relationships  . Social connections:    Talks on phone: Not on file    Gets together: Not on file    Attends religious service: Not on file    Active member of club or organization: Not on file    Attends meetings of clubs or organizations: Not on file    Relationship status: Not on file  Other Topics Concern  . Not on file  Social History Narrative   Lives with sister, no pets   Occupation: disabled, for bipolar and arthritis   Edu: GED   Activity: take walks   Diet: good water, vegetables daily   Religion: [place], Dr. Jimmye Norman (ph [phone])   Additional Social History:                         Sleep: Fair  Appetite:  Good  Current Medications: Current Facility-Administered Medications  Medication Dose Route Frequency Provider Last Rate Last Dose  . acetaminophen (TYLENOL) tablet 650 mg  650 mg Oral Q6H PRN Clapacs, Madie Reno, MD   650 mg at 11/16/17 0012  . alum & mag hydroxide-simeth (MAALOX/MYLANTA) 200-200-20 MG/5ML suspension 30 mL  30 mL Oral Q4H PRN Clapacs, John T, MD      . divalproex (DEPAKOTE) DR tablet 500 mg  500 mg Oral Q12H Clapacs, Madie Reno, MD   500 mg at 11/15/17 2118  . docusate sodium (COLACE) capsule 100 mg  100 mg Oral BID Clapacs, Madie Reno, MD   100 mg at 11/15/17 1701  . etodolac (LODINE) capsule 200 mg  200 mg Oral BID Clapacs, John T, MD   200 mg at 11/15/17 1702  . hydrOXYzine  (ATARAX/VISTARIL) tablet 25 mg  25 mg Oral TID PRN Clapacs, John T, MD      . lamoTRIgine (LAMICTAL) tablet 50 mg  50 mg Oral BID Pucilowska, Jolanta B, MD      . levothyroxine (SYNTHROID, LEVOTHROID) tablet 50 mcg  50 mcg Oral QAC breakfast Pucilowska, Jolanta B, MD   50 mcg at 11/15/17 [phone]  . losartan (COZAAR) tablet 50 mg  50 mg Oral Daily Clapacs, Madie Reno, MD   50 mg at 11/15/17 0809  . magnesium citrate solution 1 Bottle  1 Bottle Oral Once Pucilowska, Jolanta B, MD      . magnesium hydroxide (MILK OF MAGNESIA) suspension 30 mL  30 mL Oral Daily PRN Clapacs, John T, MD      . metoprolol tartrate (LOPRESSOR) tablet 12.5 mg  12.5 mg Oral BID Clapacs, John T, MD   12.5 mg at 11/15/17 1702  . polyethylene glycol (MIRALAX / GLYCOLAX) packet 17 g  17 g Oral Daily Pucilowska, Jolanta B, MD      . QUEtiapine (SEROQUEL) tablet 400 mg  400 mg Oral QHS Pucilowska, Jolanta B, MD      . simvastatin (ZOCOR) tablet 20 mg  20 mg Oral q1800 Clapacs, Madie Reno, MD   20 mg at 11/15/17 1702  . traZODone (DESYREL) tablet 100 mg  100 mg Oral QHS Lenward Chancellor, MD   100 mg at 11/15/17 2118    Lab Results:  Results for orders placed or performed during the hospital encounter of 11/13/17 (from the past 48 hour(s))  Glucose, capillary     Status: Abnormal   Collection Time: 11/15/17  6:35 AM  Result Value Ref Range   Glucose-Capillary 102 (H) 65 -  99 mg/dL  Valproic acid level     Status: None   Collection Time: 11/16/17  6:47 AM  Result Value Ref Range   Valproic Acid Lvl 67 50.0 - 100.0 ug/mL    Comment: Performed at [facility], [address] [place], [place] [address]    Blood Alcohol level:  Lab Results  Component Value Date   [facility] <10 11/13/2017   ETH <10 72/04/4708    Metabolic Disorder Labs: Lab Results  Component Value Date   HGBA1C 5.9 (H) 11/14/2017   MPG 122.63 11/14/2017   MPG 114 04/14/2012   Lab Results  Component Value Date   PROLACTIN 33.6 (H) 12/04/2015   Lab  Results  Component Value Date   CHOL 140 11/14/2017   TRIG 152 (H) 11/14/2017   HDL 50 11/14/2017   CHOLHDL 2.8 11/14/2017   VLDL 30 11/14/2017   LDLCALC 60 11/14/2017   LDLCALC 81 09/30/2017    Physical Findings: AIMS: Facial and Oral Movements Muscles of Facial Expression: None, normal Lips and Perioral Area: None, normal Jaw: None, normal Tongue: None, normal,Extremity Movements Upper (arms, wrists, hands, fingers): None, normal Lower (legs, knees, ankles, toes): None, normal, Trunk Movements Neck, shoulders, hips: None, normal, Overall Severity Severity of abnormal movements (highest score from questions above): None, normal Incapacitation due to abnormal movements: None, normal Patient's awareness of abnormal movements (rate only patient's report): No Awareness, Dental Status Current problems with teeth and/or dentures?: No Does patient usually wear dentures?: No  CIWA:    COWS:     Musculoskeletal: Strength & Muscle Tone: within normal limits Gait & Station: normal Patient leans: N/A  Psychiatric Specialty Exam: Physical Exam  Nursing note and vitals reviewed. Psychiatric: Judgment normal. Her mood appears anxious. Her speech is rapid and/or pressured and tangential. She is actively hallucinating. Thought content is delusional. Cognition and memory are impaired.    Review of Systems  Gastrointestinal: Positive for constipation.  Neurological: Positive for headaches.  Psychiatric/Behavioral: Positive for hallucinations.  All other systems reviewed and are negative.   Blood pressure 133/71, pulse 83, temperature 98 F (36.7 C), temperature source Oral, resp. rate 20, height 5' 2"  (1.575 m), weight 87.1 kg (192 lb), SpO2 98 %.Body mass index is 35.12 kg/m.  General Appearance: Casual  Eye Contact:  Good  Speech:  Clear and Coherent  Volume:  Normal  Mood:  Euphoric  Affect:  Congruent  Thought Process:  Disorganized and Descriptions of Associations: Tangential   Orientation:  Full (Time, Place, and Person)  Thought Content:  Delusions and Hallucinations: Auditory Visual  Suicidal Thoughts:  No  Homicidal Thoughts:  No  Memory:  Immediate;   Poor Recent;   Poor Remote;   Poor  Judgement:  Poor  Insight:  Shallow  Psychomotor Activity:  Decreased  Concentration:  Concentration: Poor and Attention Span: Poor  Recall:  Poor  Fund of Knowledge:  Poor  Language:  Fair  Akathisia:  No  Handed:  Right  AIMS (if indicated):     Assets:  Communication Skills Desire for Improvement Financial Resources/Insurance Housing Physical Health Resilience Social Support  ADL's:  Intact  Cognition:  WNL  Sleep:  Number of Hours: 7.45     Treatment Plan Summary: Daily contact with patient to assess and evaluate symptoms and progress in treatment and Medication management   [name] is a 67 year old female with a history of bipolar disorder who returns to the hospital floridly manic with disturbing hallucinations and religious preoccupation.  #  Mood/psychosis, still manic and very labile -continue Depakote 500 mg BID, VPA level 67 -increase Seroquel to 400 mg nightly  -continue Invega sustenna injections, unclear if she received 234 mg on 4/12 -increase Lamictal to her regular dose of 50 mg BID  #Insomnia  #DM, stable  -continue Metformin 500 mg BID -ADA diet, BCG  #HTN, stable on current medications -continue Metorpolol 25 mg BID and Cozaar 50 mg daily  #Dyslipidemia, stable -continue Zocor 20 mg daily  #Hypothyidism -TSH slightly elevated at 5.8 -Synthroid 100 ug daily  #Arthritis, knee pain -continue Lodine 200 mg BID   #Constipation, no BM in 4 days -continue Colace 100 mg BID and Miralax daily -one time Mag citrate  #Sleep apnea -CPAP is not available on the unit  #Metabolic syndrome monitoring -Lipid panel shows slightly elevated TG, HgbA1C 5.8 -EKG reviewed, sinus brady of 59 with first degree AV block, QTc 461,  asymptomatic  #Disposition -discharge to home with the sister -follow up with [facility] ACT team    Orson Slick, MD 11/16/2017, 8:24 AM

## 2017-11-16 NOTE — Progress Notes (Signed)
Recreation Therapy Notes  INPATIENT RECREATION THERAPY ASSESSMENT  Patient Details Name: Kennedie Pardoe MRN: [id number] DOB: [birth year] Today's Date: 11/16/2017       Information Obtained From: Patient  Able to Participate in Assessment/Interview: Yes  Patient Presentation: Responsive  Reason for Admission (Per Patient): Active Symptoms(Could not sleep)  Patient Stressors: (Health, Sleep)  Coping Skills:   Prayer, Read  Leisure Interests (2+):  Individual - Reading, Individual - Other (Comment)(Word search, read bible)  Frequency of Recreation/Participation: Weekly  Awareness of Community Resources:  Yes  Community Resources:     Current Use: No  If no, Barriers?: Transportation  Expressed Interest in [place]: Yes  County of Residence:  [place]  Patient Main Form of Transportation: Other (Comment)(My sister takes me around)  Patient Strengths:  My humor  Patient Identified Areas of Improvement:  To get my mind situated  Patient Goal for Hospitalization:  To work on my sleep  Current SI (including self-harm):  No  Current HI:  No  Current AVH: No  Staff Intervention Plan: Collaborate with Interdisciplinary Treatment Team, Group Attendance  Consent to Intern Participation: N/A  Kalleigh Harbor 11/16/2017, 2:35 PM

## 2017-11-16 NOTE — Progress Notes (Addendum)
D- Patient alert and oriented. Patient presents in a pleasant mood stating that she slept "not too bad, in and out with the pain". Patient rates her pain a "10/10" in her left knee, that she states is chronic. Patient rates her depression a "8/10" stating she's depressed because of "when I loss my parents, we were a close family and my mom always prayed for me". Patient denies SI, HI, AVH stating to this writer "I don't want to hurt nobody, I want to go to heaven". Patient also denies any signs/sympotms of anxiety at this time. Patient's goal for today is to "get well" in which she will accomplish this goal by "pray".   A- Scheduled medications administered to patient, per MD orders. Support and encouragement provided.  Routine safety checks conducted every 15 minutes.  Patient informed to notify staff with problems or concerns.  R- No adverse drug reactions noted. Patient contracts for safety at this time. Patient compliant with medications and treatment plan. Patient receptive, calm, and cooperative. Patient interacts well with others on the unit.  Patient remains safe at this time.

## 2017-11-16 NOTE — BHH Group Notes (Signed)
[place] Group Notes:  (Nursing/MHT/Case Management/Adjunct)  Date:  11/16/2017  Time:  3:13 PM  Type of Therapy:  Psychoeducational Skills  Participation Level:  Active  Participation Quality:  Appropriate  Affect:  Appropriate  Cognitive:  Appropriate  Insight:  Appropriate  Engagement in Group:  Engaged  Modes of Intervention:  Socialization  Summary of Progress/Problems:  Gina [name] Gina [name] 11/16/2017, 3:13 PM

## 2017-11-17 ENCOUNTER — Inpatient Hospital Stay: Payer: Self-pay | Admitting: Family Medicine

## 2017-11-17 MED ORDER — TRAMADOL-ACETAMINOPHEN 37.5-325 MG PO TABS
1.0000 | ORAL_TABLET | Freq: Four times a day (QID) | ORAL | Status: DC | PRN
  Administered 2017-11-18 – 2017-11-21 (×7): 1 via ORAL
  Filled 2017-11-17 (×14): qty 1

## 2017-11-17 MED ORDER — DICLOFENAC SODIUM 1 % TD GEL
2.0000 g | Freq: Four times a day (QID) | TRANSDERMAL | Status: DC
  Administered 2017-11-17 – 2017-11-23 (×15): 2 g via TOPICAL
  Filled 2017-11-17: qty 100

## 2017-11-17 NOTE — BHH Group Notes (Signed)
11/17/2017 1PM  Type of Therapy/Topic:  Group Therapy:  Feelings about Diagnosis  Participation Level:  Active   Description of Group:   This group will allow patients to explore their thoughts and feelings about diagnoses they have received. Patients will be guided to explore their level of understanding and acceptance of these diagnoses. Facilitator will encourage patients to process their thoughts and feelings about the reactions of others to their diagnosis and will guide patients in identifying ways to discuss their diagnosis with significant others in their lives. This group will be process-oriented, with patients participating in exploration of their own experiences, giving and receiving support, and processing challenge from other group members.   Therapeutic Goals: 1. Patient will demonstrate understanding of diagnosis as evidenced by identifying two or more symptoms of the disorder 2. Patient will be able to express two feelings regarding the diagnosis 3. Patient will demonstrate their ability to communicate their needs through discussion and/or role play  Summary of Patient Progress: Actively and appropriately engaged in the group. Patient was able to provide support and validation to other group members.Patient practiced active listening when interacting with the facilitator and other group members. Gina [name] says "I pray and I focus a lot of my time on the Lord." she continued to mention how her religious beliefs help her cope with her stressors.  Patient in still in the process of obtaining treatment goals.        Therapeutic Modalities:   Cognitive Behavioral Therapy Brief Therapy Feelings Identification    Darin Engels, LCSW 11/17/2017 1:57 PM

## 2017-11-17 NOTE — Progress Notes (Signed)
Recreation Therapy Notes  Date: 11/17/2017  Time: 9:30 am  Location: Craft Room  Behavioral response: Appropriate  Intervention Topic: Coping skills  Discussion/Intervention:  Group content on today was focused on coping skills. The group defined what coping skills are and when they can be used. Individuals described how they normally cope with thing and the coping skills they normally use. Patients expressed why it is important to cope with things and how not coping with things can affect you. The group participated in the intervention "Exploring coping skills" where they had a chance to test new coping skills they could use in the future.   Clinical Observations/Feedback:  Patient came to group and stated a positive coping skill she uses is praying. Individual participated in the intervention and was social with peers and staff during group.   Johnnye Sandford LRT/CTRS         Bristyn Kulesza 11/17/2017 11:07 AM

## 2017-11-17 NOTE — BHH Group Notes (Signed)
[facility] Group Notes:  (Nursing/MHT/Case Management/Adjunct)  Date:  11/17/2017  Time:  11:05 PM  Type of Therapy:  Group Therapy  Participation Level:  Active  Participation Quality:  Sharing  Affect:  Appropriate  Cognitive:  Alert  Insight:  Good  Engagement in Group:  Engaged  Modes of Intervention:  Support  Summary of Progress/Problems:  Gina [name] 11/17/2017, 11:05 PM

## 2017-11-17 NOTE — Progress Notes (Signed)
Patient ID: Gina [name], female   DOB: [birth year], 67 y.o.   MRN: [id number] Per State regulations 482.30 this chart was reviewed for medical necessity with respect to the patient's admission/duration of stay.    Next review date:11/21/17  Debarah Crape, BSN, RN-BC  Case Manager

## 2017-11-17 NOTE — Progress Notes (Signed)
D- Patient alert and oriented. Patient presents in a pleasant mood on assessment stating that she "slept good until I had to go to the bathroom". Patient continues to complain about her left knee pain and stated that she is going to talk with the doctor because she needs something stronger for her pain. Patient reports that her depression level is a "7/10" stating that she feels this way because "I'm worried about my sister, she's sick". Patient rates her anxiety level a "5/10" stating to this writer "you keep on praying". Patient denies SI, HI, AVH, at this time. Patient's goal for today is "to get well", in which she will "help one another" to accomplish this goal.  A- Scheduled medications administered to patient, per MD orders. Support and encouragement provided.  Routine safety checks conducted every 15 minutes.  Patient informed to notify staff with problems or concerns.  R- No adverse drug reactions noted. Patient contracts for safety at this time. Patient compliant with medications and treatment plan. Patient receptive, calm, and cooperative. Patient interacts well with others on the unit.  Patient remains safe at this time.

## 2017-11-17 NOTE — BHH Group Notes (Signed)
  11/17/2017  Time: 0900  Type of Therapy and Topic: Group Therapy: Goals Group: SMART Goals   Participation Level:  Active   Description of Group:   The purpose of a daily goals group is to assist and guide patients in setting recovery/wellness-related goals. The objective is to set goals as they relate to the crisis in which they were admitted. Patients will be using SMART goal modalities to set measurable goals. Characteristics of realistic goals will be discussed and patients will be assisted in setting and processing how one will reach their goal. Facilitator will also assist patients in applying interventions and coping skills learned in psycho-education groups to the SMART goal and process how one will achieve defined goal.   Therapeutic Goals:  -Patients will develop and document one goal related to or their crisis in which brought them into treatment.  -Patients will be guided by LCSW using SMART goal setting modality in how to set a measurable, attainable, realistic and time sensitive goal.  -Patients will process barriers in reaching goal.  -Patients will process interventions in how to overcome and successful in reaching goal.   Patient's Goal:  Pt continues to work towards their tx goals but has not yet reached them. Pt was able to appropriately participate in group discussion, and was able to offer support/validation to other group members. Pt reported her goal for the day is to, "talk to the MD at least one time by the end of the day about medications."   Therapeutic Modalities:  Motivational Interviewing  Cognitive Behavioral Therapy  Crisis Intervention Model  SMART goals setting  Alden Hipp, MSW, LCSW Clinical Social Worker 11/17/2017 10:00 AM

## 2017-11-17 NOTE — Progress Notes (Signed)
Patient ID: Gina [name], female   DOB: 1951-06-11, 67 y.o.   MRN: [id number] "I told them I wasn't ready the last time.... I couldn't sleep... I am back here again .." Pleasant, jovial, visible in the milieu, interacting well with peers and staffs; denied SI/HI/AVH.

## 2017-11-17 NOTE — Plan of Care (Signed)
Patient has the ability to cope and function at an adequate level and has shown some interest in activities by attending/participating in unit groups and going outside to the courtyard staff and other members on the unit without any issues thus far.  Patient denies SI/HI/AVH. Patient also denies signs and symptoms of anxiety, however she does endorse a depression level of "7/10" stating that she feels this way because of "I'm worried about my sister because she's sick". Patient stated that she slept "good until I had to go to the bathroom" last night. Patient has the ability to identify the available resources that can assist her in meeting her health care needs, but has not voiced any to this writer as of yet because she doesn't know the area. Patient has been in compliance with her therapeutic regimen and all questions and concerns have been addressed and answered. Patient has demonstrated positive changes in social behaviors/relationships. Patient has remained free from injury thus far on the unit and remains safe at this time.  Problem: Spiritual Needs Goal: Ability to function at adequate level Outcome: Progressing   Problem: Education: Goal: Emotional status will improve Outcome: Progressing Goal: Mental status will improve Outcome: Progressing   Problem: Activity: Goal: Interest or engagement in activities will improve Outcome: Progressing Goal: Sleeping patterns will improve Outcome: Progressing   Problem: Health Behavior/Discharge Planning: Goal: Identification of resources available to assist in meeting health care needs will improve Outcome: Progressing Goal: Compliance with treatment plan for underlying cause of condition will improve Outcome: Progressing   Problem: Education: Goal: Utilization of techniques to improve thought processes will improve Outcome: Progressing   Problem: Coping: Goal: Coping ability will improve Outcome: Progressing Goal: Will verbalize  feelings Outcome: Progressing   Problem: Health Behavior/Discharge Planning: Goal: Compliance with therapeutic regimen will improve Outcome: Progressing   Problem: Role Relationship: Goal: Will demonstrate positive changes in social behaviors and relationships Outcome: Progressing   Problem: Self-Concept: Goal: Will verbalize positive feelings about self Outcome: Progressing Goal: Level of anxiety will decrease Outcome: Progressing   Problem: Safety: Goal: Ability to remain free from injury will improve Outcome: Progressing

## 2017-11-17 NOTE — Progress Notes (Signed)
[facility] MD Progress Note  11/17/2017 11:02 AM Aidah Forquer  MRN:  [id number]  Subjective:    Ms. Zuccaro remains bubbly and disorganized. She remains preoccupied with religious themes. She complains of knee pain but otherwise feels fine. Slept better last night with higher dose of Seroquel. Per her ACT team, she did receive 234 mg of Invega sustenna injection on 4/11. She accepts medications and tolerates them well.   Principal Problem: Bipolar I disorder, most recent episode mixed, severe with psychotic features ([place]) Diagnosis:   Patient Active Problem List   Diagnosis Date Noted  . Bipolar I disorder, most recent episode mixed, severe with psychotic features ([place]) [F31.64] 11/13/2017    Priority: High  . Bipolar I disorder, current or most recent episode manic, with psychotic features ([place]) [F31.2] 11/04/2017    Priority: High  . Gallbladder polyp [K82.4] 10/21/2017  . Left shoulder pain [M25.512] 10/21/2017  . Benign neoplasm of ascending colon [D12.2]   . Bipolar affective disorder, current episode hypomanic ([place]) [F31.0] 04/08/2017  . Low back pain radiating to left lower extremity [M54.5, M79.605] 02/26/2017  . Acquired hypothyroidism [E03.9] 09/10/2016  . Thrombocytopenia ([place]) [D69.6] 06/15/2016  . Medicare annual wellness visit, subsequent [Z00.00] 06/04/2016  . Advanced care planning/counseling discussion [Z71.89] 06/04/2016  . External hemorrhoid [K64.4] 04/02/2016  . Slow transit constipation [K59.01] 01/04/2016  . Bipolar I disorder, most recent episode (or current) manic ([place]) [F31.10] 12/03/2015  . NAFLD (nonalcoholic fatty liver disease) [K76.0] 03/28/2015  . Gallbladder calculus without cholecystitis [K80.20] 03/28/2015  . Gout [M10.9] 03/27/2015  . HCV antibody positive [R76.8] 03/27/2015  . History of bladder cancer [Z85.51] 03/27/2015  . History of bundle branch block [Z86.79] 03/27/2015  . Genetic testing [Z13.79] 02/28/2015  . Loss of memory [R41.3]  09/04/2014  . Controlled type 2 diabetes mellitus with diabetic nephropathy ([place]) [E11.21] 06/07/2014  . Hyperlipidemia [E78.5]   . Anemia in CKD (chronic kidney disease) [N18.9, D63.1] 01/26/2013  . Personal history of colonic adenomas and colon cancer [Z86.010] 11/11/2012  . Osteoarthritis of knee [M17.10] 10/02/2012  . OSA on CPAP [G47.33, Z99.89]   . CKD (chronic kidney disease) stage 3, GFR 30-59 ml/min (HCC) [N18.3]   . Severe obesity (BMI 35.0-39.9) with comorbidity ([place]) [E66.01]   . HTN (hypertension), benign [I10] 04/07/2012  . Hx of colon cancer, stage I [Z85.038] 08/05/2011   Total Time spent with patient: 15 minutes  Past Psychiatric History: bipolar disorder  Past Medical History:  Past Medical History:  Diagnosis Date  . Anemia in chronic kidney disease   . Bipolar depression [facility])    sees psychiatrist - psych admission 03/2015  . Bladder cancer ([place]) 1990's  . Blood transfusion without reported diagnosis 2009  . Cataract    left  . CKD (chronic kidney disease) stage 3, GFR 30-59 ml/min (HCC)   . Colon cancer ([place]) 1990's  . DJD (degenerative joint disease), lumbar    chronic lower back pain  . Enterocutaneous fistula 04/07/2012   completed PT 06/2012 ([facility])  . Family history of breast cancer   . Family history of colon cancer   . Family history of stomach cancer   . GERD (gastroesophageal reflux disease)   . History of bladder cancer 1997  . History of colon cancer    s/p surgery  . History of uterine cancer    s/p hysterectomy  . Hyperlipidemia   . Hypertension   . IDA (iron deficiency anemia) 01/2013   thought due to h/o polyps  .  Obesity (BMI 30-39.9)   . OSA on CPAP    6cm H2O  . Personal history of colonic adenomas and colon cancer 11/11/2012  . Positive hepatitis C antibody test 09/2014   but negative confirmatory testing  . RBBB   . Uncontrolled type 2 diabetes mellitus with nephropathy ([place]) 06/07/2014   completed DSME 02/2016    Past  Surgical History:  Procedure Laterality Date  . BREAST BIOPSY Right 01/2014   fibroadenoma  . COLONOSCOPY  07/2009  . COLONOSCOPY  11/2012   2 tubular adenomas, mild diverticulosis, pending genetic testing for Lynch syndrome [name]) rpt 2 yrs  . COLONOSCOPY  02/2015   TA, diverticulosis, rpt 2 yrs [name])  . COLONOSCOPY WITH PROPOFOL N/A 06/09/2017   TAx1, rpt 2 yrs [name], [name], MD)  . DEXA  12/2009   WNL  . DOBUTAMINE STRESS ECHO  12/2009   no evidence of ischemia  . HERNIA REPAIR  02/04/12  . i & d abdominal wound  02/19/12  . LEFT OOPHORECTOMY  2005  . PARTIAL COLECTOMY  about 2008   for colon cancer  . PARTIAL HYSTERECTOMY  1981   uterine cancer, R ovary remains  . Reexploration of abdominal wound and Allograft placemet  02/26/12  . Removal of infected mesh and abdominal wound vac placement  02/24/12  . sleep study  02/2014   OSA - AHI 55, nadir 81% [name])   Family History:  Family History  Problem Relation Age of Onset  . Colon cancer Mother 34  . Stomach cancer Mother        dx in her 21s?  . CAD Father        MI; deceased 38  . Mental illness Sister        anxiety/depression  . Thyroid disease Sister   . Breast cancer Maternal Grandmother        age at dx unknown  . Diabetes Brother   . Diabetes Brother   . Diabetes Other        aunts and uncles both sides  . Arthritis Other        strong fmhx  . Colon cancer Other 20       maternal half-sister; deceased   Family Psychiatric  History: none Social History:  Social History   Substance and Sexual Activity  Alcohol Use No  . Alcohol/week: 0.0 oz     Social History   Substance and Sexual Activity  Drug Use No    Social History   Socioeconomic History  . Marital status: Single    Spouse name: Not on file  . Number of children: 0  . Years of education: Not on file  . Highest education level: Not on file  Occupational History  . Not on file  Social Needs  . Financial resource strain: Not on file   . Food insecurity:    Worry: Not on file    Inability: Not on file  . Transportation needs:    Medical: Not on file    Non-medical: Not on file  Tobacco Use  . Smoking status: Never Smoker  . Smokeless tobacco: Never Used  Substance and Sexual Activity  . Alcohol use: No    Alcohol/week: 0.0 oz  . Drug use: No  . Sexual activity: Never  Lifestyle  . Physical activity:    Days per week: Not on file    Minutes per session: Not on file  . Stress: Not on file  Relationships  . Social connections:  Talks on phone: Not on file    Gets together: Not on file    Attends religious service: Not on file    Active member of club or organization: Not on file    Attends meetings of clubs or organizations: Not on file    Relationship status: Not on file  Other Topics Concern  . Not on file  Social History Narrative   Lives with sister, no pets   Occupation: disabled, for bipolar and arthritis   Edu: GED   Activity: take walks   Diet: good water, vegetables daily   Religion: Catholic      Psych - [employer], Dr. Jimmye Norman (ph [phone])   Additional Social History:                         Sleep: Fair  Appetite:  Fair  Current Medications: Current Facility-Administered Medications  Medication Dose Route Frequency Provider Last Rate Last Dose  . acetaminophen (TYLENOL) tablet 650 mg  650 mg Oral Q6H PRN Clapacs, Madie Reno, MD   650 mg at 11/16/17 0012  . alum & mag hydroxide-simeth (MAALOX/MYLANTA) 200-200-20 MG/5ML suspension 30 mL  30 mL Oral Q4H PRN Clapacs, John T, MD      . divalproex (DEPAKOTE) DR tablet 500 mg  500 mg Oral Q12H Clapacs, Madie Reno, MD   500 mg at 11/17/17 0818  . docusate sodium (COLACE) capsule 100 mg  100 mg Oral BID Clapacs, Madie Reno, MD   100 mg at 11/17/17 0819  . etodolac (LODINE) capsule 200 mg  200 mg Oral BID Clapacs, John T, MD   200 mg at 11/17/17 0818  . hydrOXYzine (ATARAX/VISTARIL) tablet 25 mg  25 mg Oral TID PRN Clapacs,  John T, MD      . lamoTRIgine (LAMICTAL) tablet 50 mg  50 mg Oral BID Gabreille Dardis B, MD   50 mg at 11/17/17 0818  . levothyroxine (SYNTHROID, LEVOTHROID) tablet 50 mcg  50 mcg Oral QAC breakfast Dona Klemann B, MD   50 mcg at 11/17/17 0818  . losartan (COZAAR) tablet 50 mg  50 mg Oral Daily Clapacs, Madie Reno, MD   50 mg at 11/17/17 0818  . magnesium citrate solution 1 Bottle  1 Bottle Oral Once Nam Vossler B, MD      . magnesium hydroxide (MILK OF MAGNESIA) suspension 30 mL  30 mL Oral Daily PRN Clapacs, John T, MD      . metoprolol tartrate (LOPRESSOR) tablet 12.5 mg  12.5 mg Oral BID Clapacs, John T, MD   12.5 mg at 11/17/17 0818  . [START ON 12/10/2017] paliperidone (INVEGA SUSTENNA) injection 234 mg  234 mg Intramuscular Q28 days Delsa Walder B, MD      . polyethylene glycol (MIRALAX / GLYCOLAX) packet 17 g  17 g Oral Daily Jerre Diguglielmo B, MD   17 g at 11/16/17 0848  . QUEtiapine (SEROQUEL) tablet 400 mg  400 mg Oral QHS Nasiya Pascual B, MD   400 mg at 11/16/17 2136  . simvastatin (ZOCOR) tablet 20 mg  20 mg Oral q1800 Clapacs, Madie Reno, MD   20 mg at 11/16/17 1753  . traZODone (DESYREL) tablet 100 mg  100 mg Oral QHS Lenward Chancellor, MD   100 mg at 11/16/17 2137    Lab Results:  Results for orders placed or performed during the hospital encounter of 11/13/17 (from the past 48 hour(s))  Valproic acid level  Status: None   Collection Time: 11/16/17  6:47 AM  Result Value Ref Range   Valproic Acid Lvl 67 50.0 - 100.0 ug/mL    Comment: Performed at [facility], [address] [place], [place] [address]    Blood Alcohol level:  Lab Results  Component Value Date   [facility] <10 11/13/2017   ETH <10 76/72/0947    Metabolic Disorder Labs: Lab Results  Component Value Date   HGBA1C 5.9 (H) 11/14/2017   MPG 122.63 11/14/2017   MPG 114 04/14/2012   Lab Results  Component Value Date   PROLACTIN 33.6 (H) 12/04/2015   Lab Results   Component Value Date   CHOL 140 11/14/2017   TRIG 152 (H) 11/14/2017   HDL 50 11/14/2017   CHOLHDL 2.8 11/14/2017   VLDL 30 11/14/2017   LDLCALC 60 11/14/2017   LDLCALC 81 09/30/2017    Physical Findings: AIMS: Facial and Oral Movements Muscles of Facial Expression: None, normal Lips and Perioral Area: None, normal Jaw: None, normal Tongue: None, normal,Extremity Movements Upper (arms, wrists, hands, fingers): None, normal Lower (legs, knees, ankles, toes): None, normal, Trunk Movements Neck, shoulders, hips: None, normal, Overall Severity Severity of abnormal movements (highest score from questions above): None, normal Incapacitation due to abnormal movements: None, normal Patient's awareness of abnormal movements (rate only patient's report): No Awareness, Dental Status Current problems with teeth and/or dentures?: No Does patient usually wear dentures?: No  CIWA:    COWS:     Musculoskeletal: Strength & Muscle Tone: within normal limits Gait & Station: normal Patient leans: N/A  Psychiatric Specialty Exam: Physical Exam  Nursing note and vitals reviewed. Psychiatric: Judgment normal. Her affect is inappropriate. Her speech is rapid and/or pressured. She is actively hallucinating. Thought content is delusional. Cognition and memory are impaired.    Review of Systems  Musculoskeletal: Positive for joint pain.  Neurological: Negative.   Psychiatric/Behavioral: Positive for hallucinations. The patient has insomnia.   All other systems reviewed and are negative.   Blood pressure 131/79, pulse 89, temperature 98 F (36.7 C), temperature source Oral, resp. rate 20, height 5\' 2"  (1.575 m), weight 87.1 kg (192 lb), SpO2 100 %.Body mass index is 35.12 kg/m.  General Appearance: Casual  Eye Contact:  Good  Speech:  Clear and Coherent  Volume:  Increased  Mood:  Euphoric  Affect:  Congruent  Thought Process:  Irrelevant and Descriptions of Associations: Loose   Orientation:  Full (Time, Place, and Person)  Thought Content:  Delusions and Hallucinations: Auditory Visual  Suicidal Thoughts:  No  Homicidal Thoughts:  No  Memory:  Immediate;   Poor Recent;   Poor Remote;   Poor  Judgement:  Fair  Insight:  Shallow  Psychomotor Activity:  Increased  Concentration:  Concentration: Fair and Attention Span: Poor  Recall:  Poor  Fund of Knowledge:  Poor  Language:  Poor  Akathisia:  No  Handed:  Right  AIMS (if indicated):     Assets:  Communication Skills Desire for Improvement Financial Resources/Insurance Housing Physical Health Resilience Social Support  ADL's:  Intact  Cognition:  WNL  Sleep:  Number of Hours: 7.3     Treatment Plan Summary: Daily contact with patient to assess and evaluate symptoms and progress in treatment and Medication management   [name] is a 67 year old female with a history of bipolar disorder who returns to the hospital floridly manic with disturbing hallucinations and religious preoccupation. All manic symptoms continue.  #Mood/psychosis, still manic  and very labile -continue Depakote 500 mg BID, VPA level 67 -continue Seroquel to 400 mg nightly  -continue Invega sustenna injections every 28 days, next dose on 5/9 -continue Lamictal to her regular dose of 50 mg BID  #Insomnia, resolved with current treatment  #DM, stable  -continue Metformin 500 mg BID -ADA diet, BCG  #HTN, stable on current medications -continue Metorpolol 25 mg BID and Cozaar 50 mg daily  #Dyslipidemia, stable -continue Zocor 20 mg daily  #Hypothyidism -TSH slightly elevated at 5.8 -Synthroid 100 ug daily  #Arthritis, knee pain -continue Lodine 200 mg BID   #Constipation, no BM in 4 days -continue Colace 100 mg BID and Miralax daily -one time Mag citrate  #Sleep apnea -CPAP is not available on the unit  #Metabolic syndrome monitoring -Lipid panel shows slightly elevated TG, HgbA1C 5.8 -EKG  reviewed, sinus brady of 59 with first degree AV block, QTc 461, asymptomatic  #Disposition -discharge to home with the sister -follow up with [facility] ACT team     Orson Slick, MD 11/17/2017, 11:02 AM

## 2017-11-17 NOTE — Plan of Care (Addendum)
Patient slept for Estimated Hours of 7.30; Precautionary checks every 15 minutes for safety maintained, room free of safety hazards, patient sustains no injury or falls during this shift.  Problem: Spiritual Needs Goal: Ability to function at adequate level Outcome: Progressing   Problem: Education: Goal: Emotional status will improve Outcome: Progressing Goal: Mental status will improve Outcome: Progressing   Problem: Activity: Goal: Interest or engagement in activities will improve Outcome: Progressing Goal: Sleeping patterns will improve Outcome: Progressing   Problem: Health Behavior/Discharge Planning: Goal: Identification of resources available to assist in meeting health care needs will improve Outcome: Progressing Goal: Compliance with treatment plan for underlying cause of condition will improve Outcome: Progressing   Problem: Education: Goal: Utilization of techniques to improve thought processes will improve Outcome: Progressing   Problem: Coping: Goal: Coping ability will improve Outcome: Progressing Goal: Will verbalize feelings Outcome: Progressing   Problem: Health Behavior/Discharge Planning: Goal: Compliance with therapeutic regimen will improve Outcome: Progressing   Problem: Role Relationship: Goal: Will demonstrate positive changes in social behaviors and relationships Outcome: Progressing   Problem: Self-Concept: Goal: Will verbalize positive feelings about self Outcome: Progressing Goal: Level of anxiety will decrease Outcome: Progressing   Problem: Safety: Goal: Ability to remain free from injury will improve Outcome: Progressing

## 2017-11-18 NOTE — Progress Notes (Signed)
Patient ID: Gina [name], female   DOB: 07/20/1951, 67 y.o.   MRN: [id number] Interacting well with peers, visible, attended group, c/o 9/10 Left knee arthritis pain, Voltaren transdermal applied to site at 2005, 10/10 HA, received 1 tablet of Ultracet at 0055; pleasant, jovial, attended group, appropriate in mood and affect' denied SI/HI/AVH.

## 2017-11-18 NOTE — [facility] Group Notes (Signed)
LCSW Group Therapy Note  11/18/2017 1:00 pm  Type of Therapy/Topic:  Group Therapy:  Emotion Regulation  Participation Level:  Minimal   Description of Group:    The purpose of this group is to assist patients in learning to regulate negative emotions and experience positive emotions. Patients will be guided to discuss ways in which they have been vulnerable to their negative emotions. These vulnerabilities will be juxtaposed with experiences of positive emotions or situations, and patients will be challenged to use positive emotions to combat negative ones. Special emphasis will be placed on coping with negative emotions in conflict situations, and patients will process healthy conflict resolution skills.  Therapeutic Goals: 1. Patient will identify two positive emotions or experiences to reflect on in order to balance out negative emotions 2. Patient will label two or more emotions that they find the most difficult to experience 3. Patient will demonstrate positive conflict resolution skills through discussion and/or role plays  Summary of Patient Progress: Gina [name] was able to engage some in today's group discussion on emotion regulation.  Gina [name] shared that she has had a difficult time experiencing sadness that she feels is related to a recent death in her family.  Gina [name] shared that she has also been experiencing frustration.  Gina [name] shared that she is trying to use the conflict resolution skill of engaging in a respectful conversation with others and respecting others' opinions even though she doesn't agree with them.        Therapeutic Modalities:   Cognitive Behavioral Therapy Feelings Identification Dialectical Behavioral Therapy

## 2017-11-18 NOTE — Progress Notes (Signed)
[facility] MD Progress Note  11/18/2017 9:46 AM Gina [name]  MRN:  [id number]  Subjective:   Gina [name] is loud, talkative, intrusive and demanding. She bitterly complains of knee pain from arthritis demanding medications. She did not respond to Lodine and feels no improvement with Voltaren gel. I will offer lower dose Tramadol. PT consult pending as well. Sleep is interrupted due to pain and difficulties getting out of bed.   Principal Problem: Bipolar I disorder, most recent episode mixed, severe with psychotic features ([place]) Diagnosis:   Patient Active Problem List   Diagnosis Date Noted  . Bipolar I disorder, most recent episode mixed, severe with psychotic features ([place]) [F31.64] 11/13/2017    Priority: High  . Bipolar I disorder, current or most recent episode manic, with psychotic features ([place]) [F31.2] 11/04/2017    Priority: High  . Gallbladder polyp [K82.4] 10/21/2017  . Left shoulder pain [M25.512] 10/21/2017  . Benign neoplasm of ascending colon [D12.2]   . Bipolar affective disorder, current episode hypomanic ([place]) [F31.0] 04/08/2017  . Low back pain radiating to left lower extremity [M54.5, M79.605] 02/26/2017  . Acquired hypothyroidism [E03.9] 09/10/2016  . Thrombocytopenia ([place]) [D69.6] 06/15/2016  . Medicare annual wellness visit, subsequent [Z00.00] 06/04/2016  . Advanced care planning/counseling discussion [Z71.89] 06/04/2016  . External hemorrhoid [K64.4] 04/02/2016  . Slow transit constipation [K59.01] 01/04/2016  . Bipolar I disorder, most recent episode (or current) manic ([place]) [F31.10] 12/03/2015  . NAFLD (nonalcoholic fatty liver disease) [K76.0] 03/28/2015  . Gallbladder calculus without cholecystitis [K80.20] 03/28/2015  . Gout [M10.9] 03/27/2015  . HCV antibody positive [R76.8] 03/27/2015  . History of bladder cancer [Z85.51] 03/27/2015  . History of bundle branch block [Z86.79] 03/27/2015  . Genetic testing [Z13.79] 02/28/2015  . Loss of memory  [R41.3] 09/04/2014  . Controlled type 2 diabetes mellitus with diabetic nephropathy ([place]) [E11.21] 06/07/2014  . Hyperlipidemia [E78.5]   . Anemia in CKD (chronic kidney disease) [N18.9, D63.1] 01/26/2013  . Personal history of colonic adenomas and colon cancer [Z86.010] 11/11/2012  . Osteoarthritis of knee [M17.10] 10/02/2012  . OSA on CPAP [G47.33, Z99.89]   . CKD (chronic kidney disease) stage 3, GFR 30-59 ml/min (HCC) [N18.3]   . Severe obesity (BMI 35.0-39.9) with comorbidity ([place]) [E66.01]   . HTN (hypertension), benign [I10] 04/07/2012  . Hx of colon cancer, stage I [Z85.038] 08/05/2011   Total Time spent with patient: 15 minutes  Past Psychiatric History: bipolar disorder  Past Medical History:  Past Medical History:  Diagnosis Date  . Anemia in chronic kidney disease   . Bipolar depression [facility])    sees psychiatrist - psych admission 03/2015  . Bladder cancer ([place]) 1990's  . Blood transfusion without reported diagnosis 2009  . Cataract    left  . CKD (chronic kidney disease) stage 3, GFR 30-59 ml/min (HCC)   . Colon cancer ([place]) 1990's  . DJD (degenerative joint disease), lumbar    chronic lower back pain  . Enterocutaneous fistula 04/07/2012   completed PT 06/2012 ([facility])  . Family history of breast cancer   . Family history of colon cancer   . Family history of stomach cancer   . GERD (gastroesophageal reflux disease)   . History of bladder cancer 1997  . History of colon cancer    s/p surgery  . History of uterine cancer    s/p hysterectomy  . Hyperlipidemia   . Hypertension   . IDA (iron deficiency anemia) 01/2013   thought due to h/o polyps  .  Obesity (BMI 30-39.9)   . OSA on CPAP    6cm H2O  . Personal history of colonic adenomas and colon cancer 11/11/2012  . Positive hepatitis C antibody test 09/2014   but negative confirmatory testing  . RBBB   . Uncontrolled type 2 diabetes mellitus with nephropathy ([place]) 06/07/2014   completed DSME 02/2016     Past Surgical History:  Procedure Laterality Date  . BREAST BIOPSY Right 01/2014   fibroadenoma  . COLONOSCOPY  07/2009  . COLONOSCOPY  11/2012   2 tubular adenomas, mild diverticulosis, pending genetic testing for Lynch syndrome [name]) rpt 2 yrs  . COLONOSCOPY  02/2015   TA, diverticulosis, rpt 2 yrs [name])  . COLONOSCOPY WITH PROPOFOL N/A 06/09/2017   TAx1, rpt 2 yrs [name], [name], MD)  . DEXA  12/2009   WNL  . DOBUTAMINE STRESS ECHO  12/2009   no evidence of ischemia  . HERNIA REPAIR  02/04/12  . i & d abdominal wound  02/19/12  . LEFT OOPHORECTOMY  2005  . PARTIAL COLECTOMY  about 2008   for colon cancer  . PARTIAL HYSTERECTOMY  1981   uterine cancer, R ovary remains  . Reexploration of abdominal wound and Allograft placemet  02/26/12  . Removal of infected mesh and abdominal wound vac placement  02/24/12  . sleep study  02/2014   OSA - AHI 55, nadir 81% [name])   Family History:  Family History  Problem Relation Age of Onset  . Colon cancer Mother 59  . Stomach cancer Mother        dx in her 82s?  . CAD Father        MI; deceased 59  . Mental illness Sister        anxiety/depression  . Thyroid disease Sister   . Breast cancer Maternal Grandmother        age at dx unknown  . Diabetes Brother   . Diabetes Brother   . Diabetes Other        aunts and uncles both sides  . Arthritis Other        strong fmhx  . Colon cancer Other 63       maternal half-sister; deceased   Family Psychiatric  History: none Social History:  Social History   Substance and Sexual Activity  Alcohol Use No  . Alcohol/week: 0.0 oz     Social History   Substance and Sexual Activity  Drug Use No    Social History   Socioeconomic History  . Marital status: Single    Spouse name: Not on file  . Number of children: 0  . Years of education: Not on file  . Highest education level: Not on file  Occupational History  . Not on file  Social Needs  . Financial resource strain: Not on  file  . Food insecurity:    Worry: Not on file    Inability: Not on file  . Transportation needs:    Medical: Not on file    Non-medical: Not on file  Tobacco Use  . Smoking status: Never Smoker  . Smokeless tobacco: Never Used  Substance and Sexual Activity  . Alcohol use: No    Alcohol/week: 0.0 oz  . Drug use: No  . Sexual activity: Never  Lifestyle  . Physical activity:    Days per week: Not on file    Minutes per session: Not on file  . Stress: Not on file  Relationships  . Social connections:  Talks on phone: Not on file    Gets together: Not on file    Attends religious service: Not on file    Active member of club or organization: Not on file    Attends meetings of clubs or organizations: Not on file    Relationship status: Not on file  Other Topics Concern  . Not on file  Social History Narrative   Lives with sister, no pets   Occupation: disabled, for bipolar and arthritis   Edu: GED   Activity: take walks   Diet: good water, vegetables daily   Religion: [place], Dr. Jimmye Norman (ph [phone])   Additional Social History:                         Sleep: Poor  Appetite:  Fair  Current Medications: Current Facility-Administered Medications  Medication Dose Route Frequency Provider Last Rate Last Dose  . alum & mag hydroxide-simeth (MAALOX/MYLANTA) 200-200-20 MG/5ML suspension 30 mL  30 mL Oral Q4H PRN Clapacs, John T, MD      . diclofenac sodium (VOLTAREN) 1 % transdermal gel 2 g  2 g Topical QID Jamair Cato B, MD   2 g at 11/17/17 2005  . divalproex (DEPAKOTE) DR tablet 500 mg  500 mg Oral Q12H Clapacs, Madie Reno, MD   500 mg at 11/18/17 3716  . docusate sodium (COLACE) capsule 100 mg  100 mg Oral BID Clapacs, Madie Reno, MD   100 mg at 11/17/17 1703  . hydrOXYzine (ATARAX/VISTARIL) tablet 25 mg  25 mg Oral TID PRN Clapacs, John T, MD      . lamoTRIgine (LAMICTAL) tablet 50 mg  50 mg Oral BID Audon Heymann,  Jilliann Subramanian B, MD   50 mg at 11/18/17 0821  . levothyroxine (SYNTHROID, LEVOTHROID) tablet 50 mcg  50 mcg Oral QAC breakfast Meldon Hanzlik B, MD   50 mcg at 11/18/17 9678  . losartan (COZAAR) tablet 50 mg  50 mg Oral Daily Clapacs, Madie Reno, MD   50 mg at 11/18/17 9381  . magnesium citrate solution 1 Bottle  1 Bottle Oral Once Ennis Heavner B, MD      . magnesium hydroxide (MILK OF MAGNESIA) suspension 30 mL  30 mL Oral Daily PRN Clapacs, John T, MD      . metoprolol tartrate (LOPRESSOR) tablet 12.5 mg  12.5 mg Oral BID Clapacs, Madie Reno, MD   Stopped at 11/18/17 0175  . [START ON 12/10/2017] paliperidone (INVEGA SUSTENNA) injection 234 mg  234 mg Intramuscular Q28 days Rigdon Macomber B, MD      . polyethylene glycol (MIRALAX / GLYCOLAX) packet 17 g  17 g Oral Daily Egan Sahlin B, MD   17 g at 11/18/17 1025  . QUEtiapine (SEROQUEL) tablet 400 mg  400 mg Oral QHS Grae Cannata B, MD   400 mg at 11/17/17 2121  . simvastatin (ZOCOR) tablet 20 mg  20 mg Oral q1800 Clapacs, Madie Reno, MD   20 mg at 11/17/17 1703  . traMADol-acetaminophen (ULTRACET) 37.5-325 MG per tablet 1 tablet  1 tablet Oral Q6H PRN Naomy Esham B, MD   1 tablet at 11/18/17 0055  . traZODone (DESYREL) tablet 100 mg  100 mg Oral QHS Lenward Chancellor, MD   100 mg at 11/17/17 2121    Lab Results: No results found for this or any previous visit (from the past 48 hour(s)).  Blood Alcohol level:  Lab Results  Component Value Date   ETH <10 11/13/2017   ETH <10 89/21/1941    Metabolic Disorder Labs: Lab Results  Component Value Date   HGBA1C 5.9 (H) 11/14/2017   MPG 122.63 11/14/2017   MPG 114 04/14/2012   Lab Results  Component Value Date   PROLACTIN 33.6 (H) 12/04/2015   Lab Results  Component Value Date   CHOL 140 11/14/2017   TRIG 152 (H) 11/14/2017   HDL 50 11/14/2017   CHOLHDL 2.8 11/14/2017   VLDL 30 11/14/2017   LDLCALC 60 11/14/2017   LDLCALC 81 09/30/2017    Physical  Findings: AIMS: Facial and Oral Movements Muscles of Facial Expression: None, normal Lips and Perioral Area: None, normal Jaw: None, normal Tongue: None, normal,Extremity Movements Upper (arms, wrists, hands, fingers): None, normal Lower (legs, knees, ankles, toes): None, normal, Trunk Movements Neck, shoulders, hips: None, normal, Overall Severity Severity of abnormal movements (highest score from questions above): None, normal Incapacitation due to abnormal movements: None, normal Patient's awareness of abnormal movements (rate only patient's report): No Awareness, Dental Status Current problems with teeth and/or dentures?: No Does patient usually wear dentures?: No  CIWA:    COWS:     Musculoskeletal: Strength & Muscle Tone: within normal limits Gait & Station: normal Patient leans: N/A  Psychiatric Specialty Exam: Physical Exam  Nursing note and vitals reviewed. Psychiatric: Her affect is labile and inappropriate. Her speech is rapid and/or pressured. She is hyperactive. Thought content is delusional. Cognition and memory are impaired. She expresses impulsivity.    Review of Systems  Musculoskeletal: Positive for joint pain.  Neurological: Negative.   Psychiatric/Behavioral: The patient is nervous/anxious.   All other systems reviewed and are negative.   Blood pressure 108/68, pulse 88, temperature 98.8 F (37.1 C), temperature source Oral, resp. rate 18, height 5\' 2"  (1.575 m), weight 87.1 kg (192 lb), SpO2 100 %.Body mass index is 35.12 kg/m.  General Appearance: Casual  Eye Contact:  Good  Speech:  Pressured  Volume:  Increased  Mood:  Dysphoric  Affect:  Congruent  Thought Process:  Goal Directed and Descriptions of Associations: Intact  Orientation:  Full (Time, Place, and Person)  Thought Content:  Delusions  Suicidal Thoughts:  No  Homicidal Thoughts:  No  Memory:  Immediate;   Poor Recent;   Poor Remote;   Poor  Judgement:  Impaired  Insight:  Shallow   Psychomotor Activity:  Normal  Concentration:  Concentration: Poor and Attention Span: Poor  Recall:  Poor  Fund of Knowledge:  Poor  Language:  Poor  Akathisia:  No  Handed:  Right  AIMS (if indicated):     Assets:  Communication Skills Desire for Improvement Financial Resources/Insurance Housing Resilience Social Support  ADL's:  Intact  Cognition:  WNL  Sleep:  Number of Hours: 7     Treatment Plan Summary: Daily contact with patient to assess and evaluate symptoms and progress in treatment and Medication management   Gina [name] is a 67 year old female with a history of bipolar disorderwhoreturns to the hospital floridly manic with disturbing hallucinations and religious preoccupation. All manic symptoms continue.  #Mood/psychosis, still manic and intrusive  -continue Depakote 500 mg BID,VPA level 67, free VPA 17 -continue Seroquel to 400 mg nightly -continue Invega sustenna injections every 28 days, next dose on 5/9 -continue Lamictal 50 mg BID  #Insomnia, resolved with current treatment  #DM, stable -continue Metformin 500 mg BID -ADA diet, BCG  #HTN, stable on current medications -  continue Metorpolol 25 mg BID and Cozaar 50 mg daily  #Dyslipidemia, stable -continue Zocor 20 mg daily  #Hypothyidism -TSH slightly elevated at 5.8 -Synthroid 100 ug daily  #Arthritis, knee pain -discontinue Lodine  -start Voltaren gel to both knees -start Ultracet 37.5 mg TID PRN -PT consult pending   #Constipation -continue Colace 100 mg BID and Miralax daily  #Sleep apnea, we will attempt to order CPAP treatment againg -CPAPis not available on the unit  #Metabolic syndrome monitoring -Lipid panel shows slightly elevated TG, HgbA1C 5.8 -EKGreviewed, sinus brady of 59 with first degree AV block, QTc 461, asymptomatic  #Disposition -discharge to home with the sister -follow up with [facility] ACT team    Orson Slick, MD 11/18/2017, 9:46  AM

## 2017-11-18 NOTE — Plan of Care (Signed)
Patient is alert and oriented. Patient denies SI, HI and AVH. Patient appears to be very needy asking for help with different ADLs even though  patient is capable of doing ADLs on her own. Patient states she does not like the voltaren cream for her knee. Patient insist she has a rash on her gluteus, no rash and wants staff to rub barrier cream on her rear end. Patient is able to apply barrier cream herself if needed. Patient states she has a difficult time sleeping at night. Patient attends groups and is eating meals regularly. Patient states she is not having any issues with bowels. Nurse will continue to monitor. Problem: Education: Goal: Emotional status will improve Outcome: Progressing Goal: Mental status will improve Outcome: Progressing   Problem: Activity: Goal: Interest or engagement in activities will improve Outcome: Progressing Goal: Sleeping patterns will improve Outcome: Progressing

## 2017-11-18 NOTE — Progress Notes (Signed)
PT Cancellation Note  Patient Details Name: Gina [name] MRN: [id number] DOB: 01/11/1951   Cancelled Treatment:    Reason Eval/Treat Not Completed: Other (comment)(PT consult ordered for knee pain. Pt was evaluated by this PT 1 week ago for same issue at prior admission. At that time, knee pain was chronic without acute exacerbation, pt was AMB without limitations or safety concerns. Pt has a long history of knee OA, recommended for surgery >6YA in [place] MD prior to moving here. At last PT eval, this author recommended FU with orthopedist, as knee appears to have progressed since last ortho visit, and now quite severe. PT prognosis at this time is not very good given chronicity and deformity of the joint upon inspection. Patient is at baseline. No additional skilled PT services needed at this time, PT signing off. PT recommends daily ambulation ad lib or with nursing staff as needed to prevent deconditioning.   10:21 AM, 11/18/17 Etta Grandchild, PT, DPT Physical Therapist - [facility]  [phone] ([place])    Buccola,Allan C 11/18/2017, 10:18 AM

## 2017-11-18 NOTE — Progress Notes (Signed)
[place] spoke with patient while patient was outside. CH and patient sat on bench. Patient expressed concern to speak with a priest. [facility] has notified local priest to come visit with patient. Patient is aware and stated she would wait. Patient stated that she has no ride and is lonely. [place] provided ministry of presence, active listening, and emotional support as patient shared about her loss.

## 2017-11-18 NOTE — Progress Notes (Signed)
Skin barrier applied to left buttock cheek in the presence of Ms Gina [name], MHT; no rashes in the area as claimed by the patient.

## 2017-11-18 NOTE — Progress Notes (Signed)
Recreation Therapy Notes  Date: 11/18/2017  Time: 9:30 am   Location: Craft Room   Behavioral response: N/A   Intervention Topic: Communication    Discussion/Intervention: Patient did not attend group.   Clinical Observations/Feedback:  Patient did not attend group.   Krisha Beegle LRT/CTRS        Haylo Fake 11/18/2017 10:26 AM

## 2017-11-18 NOTE — Plan of Care (Signed)
Patient slept for Estimated Hours of 7; Precautionary checks every 15 minutes for safety maintained, room free of safety hazards, patient sustains no injury or falls during this shift.  Problem: Spiritual Needs Goal: Ability to function at adequate level Outcome: Progressing   Problem: Education: Goal: Emotional status will improve Outcome: Progressing Goal: Mental status will improve Outcome: Progressing   Problem: Activity: Goal: Interest or engagement in activities will improve Outcome: Progressing Goal: Sleeping patterns will improve Outcome: Progressing   Problem: Health Behavior/Discharge Planning: Goal: Identification of resources available to assist in meeting health care needs will improve Outcome: Progressing Goal: Compliance with treatment plan for underlying cause of condition will improve Outcome: Progressing   Problem: Education: Goal: Utilization of techniques to improve thought processes will improve Outcome: Progressing   Problem: Coping: Goal: Coping ability will improve Outcome: Progressing Goal: Will verbalize feelings Outcome: Progressing   Problem: Health Behavior/Discharge Planning: Goal: Compliance with therapeutic regimen will improve Outcome: Progressing   Problem: Role Relationship: Goal: Will demonstrate positive changes in social behaviors and relationships Outcome: Progressing   Problem: Self-Concept: Goal: Will verbalize positive feelings about self Outcome: Progressing Goal: Level of anxiety will decrease Outcome: Progressing   Problem: Safety: Goal: Ability to remain free from injury will improve Outcome: Progressing

## 2017-11-19 MED ORDER — LAMOTRIGINE 100 MG PO TABS
100.0000 mg | ORAL_TABLET | Freq: Two times a day (BID) | ORAL | Status: DC
  Administered 2017-11-19 – 2017-11-23 (×9): 100 mg via ORAL
  Filled 2017-11-19 (×9): qty 1

## 2017-11-19 NOTE — Progress Notes (Signed)
D- Patient alert and oriented. Patient presents in a pleasant mood on assessment stating that she slept ok last night. Patient continues to endorse a pain level of "10/10" in her left knee, but did not request any pain medication from this writer at this time. Patient rates her depression level a "7/10" stating to this writer "I miss my sister, she can't get well". Patient rates her anxiety level a "8/10" stating she's anxious because "I think about my doctor bills and my ACT team is not treating me right". Patient denies SI, HI, AVH, at this time. Patient's goal for today is "get myself and memory back together".  A- Scheduled medications administered to patient, per MD orders. Support and encouragement provided.  Routine safety checks conducted every 15 minutes.  Patient informed to notify staff with problems or concerns.  R- No adverse drug reactions noted. Patient contracts for safety at this time. Patient compliant with medications and treatment plan. Patient receptive, calm, and cooperative. Patient interacts well with others on the unit.  Patient remains safe at this time.

## 2017-11-19 NOTE — Progress Notes (Addendum)
[facility] MD Progress Note  11/19/2017 10:08 AM Gina [name]  MRN:  [id number]  Subjective:   Mas. Glaza seems slightly better today. Her thinking is less disorganized, she is not as emotional as on admission. She tolerates medications well. Sleep and appetite are fine. She is still delusional. She does not believe that I am an MD. She is very somatic, needy and critical of our care. She complains of knee pain that has not improved with topical Vol;taren or even Tramadol. She demanded PT consult believing that her pain can be alleviated with PT. It can not, please see PT consult. Today she complains of abdominal hernia that "is getting bigger". There is no signs of acute abdomen. She discussed non emergent hernia surgery with her PCP. She continues to compare care here with the one she received at [facility] years ago.  There are, again, problems with CPAP machine and I am told that we do not have appropriate outlet in any of patients' rooms. Spoke with respiratory therapist. The patient will start treatment tonight. There are no technical problems. Respiratory was not called lest night.  Principal Problem: Bipolar I disorder, most recent episode mixed, severe with psychotic features ([place]) Diagnosis:   Patient Active Problem List   Diagnosis Date Noted  . Bipolar I disorder, most recent episode mixed, severe with psychotic features ([place]) [F31.64] 11/13/2017    Priority: High  . Bipolar I disorder, current or most recent episode manic, with psychotic features ([place]) [F31.2] 11/04/2017    Priority: High  . Gallbladder polyp [K82.4] 10/21/2017  . Left shoulder pain [M25.512] 10/21/2017  . Benign neoplasm of ascending colon [D12.2]   . Bipolar affective disorder, current episode hypomanic ([place]) [F31.0] 04/08/2017  . Low back pain radiating to left lower extremity [M54.5, M79.605] 02/26/2017  . Acquired hypothyroidism [E03.9] 09/10/2016  . Thrombocytopenia ([place]) [D69.6]  06/15/2016  . Medicare annual wellness visit, subsequent [Z00.00] 06/04/2016  . Advanced care planning/counseling discussion [Z71.89] 06/04/2016  . External hemorrhoid [K64.4] 04/02/2016  . Slow transit constipation [K59.01] 01/04/2016  . Bipolar I disorder, most recent episode (or current) manic ([place]) [F31.10] 12/03/2015  . NAFLD (nonalcoholic fatty liver disease) [K76.0] 03/28/2015  . Gallbladder calculus without cholecystitis [K80.20] 03/28/2015  . Gout [M10.9] 03/27/2015  . HCV antibody positive [R76.8] 03/27/2015  . History of bladder cancer [Z85.51] 03/27/2015  . History of bundle branch block [Z86.79] 03/27/2015  . Genetic testing [Z13.79] 02/28/2015  . Loss of memory [R41.3] 09/04/2014  . Controlled type 2 diabetes mellitus with diabetic nephropathy ([place]) [E11.21] 06/07/2014  . Hyperlipidemia [E78.5]   . Anemia in CKD (chronic kidney disease) [N18.9, D63.1] 01/26/2013  . Personal history of colonic adenomas and colon cancer [Z86.010] 11/11/2012  . Osteoarthritis of knee [M17.10] 10/02/2012  . OSA on CPAP [G47.33, Z99.89]   . CKD (chronic kidney disease) stage 3, GFR 30-59 ml/min (HCC) [N18.3]   . Severe obesity (BMI 35.0-39.9) with comorbidity ([place]) [E66.01]   . HTN (hypertension), benign [I10] 04/07/2012  . Hx of colon cancer, stage I [Z85.038] 08/05/2011   Total Time spent with patient: 15 minutes  Past Psychiatric History: bipolar disworder  Past Medical History:  Past Medical History:  Diagnosis Date  . Anemia in chronic kidney disease   . Bipolar depression [facility])    sees psychiatrist - psych admission 03/2015  . Bladder cancer ([place]) 1990's  . Blood transfusion without reported diagnosis 2009  . Cataract    left  . CKD (chronic kidney disease) stage 3,  GFR 30-59 ml/min (HCC)   . Colon cancer ([employer]) 1990's  . DJD (degenerative joint disease), lumbar    chronic lower back pain  . Enterocutaneous fistula 04/07/2012   completed PT 06/2012 ([facility])  . Family history  of breast cancer   . Family history of colon cancer   . Family history of stomach cancer   . GERD (gastroesophageal reflux disease)   . History of bladder cancer 1997  . History of colon cancer    s/p surgery  . History of uterine cancer    s/p hysterectomy  . Hyperlipidemia   . Hypertension   . IDA (iron deficiency anemia) 01/2013   thought due to h/o polyps  . Obesity (BMI 30-39.9)   . OSA on CPAP    6cm H2O  . Personal history of colonic adenomas and colon cancer 11/11/2012  . Positive hepatitis C antibody test 09/2014   but negative confirmatory testing  . RBBB   . Uncontrolled type 2 diabetes mellitus with nephropathy ([place]) 06/07/2014   completed DSME 02/2016    Past Surgical History:  Procedure Laterality Date  . BREAST BIOPSY Right 01/2014   fibroadenoma  . COLONOSCOPY  07/2009  . COLONOSCOPY  11/2012   2 tubular adenomas, mild diverticulosis, pending genetic testing for Lynch syndrome [name]) rpt 2 yrs  . COLONOSCOPY  02/2015   TA, diverticulosis, rpt 2 yrs [name])  . COLONOSCOPY WITH PROPOFOL N/A 06/09/2017   TAx1, rpt 2 yrs [name], [name], MD)  . DEXA  12/2009   WNL  . DOBUTAMINE STRESS ECHO  12/2009   no evidence of ischemia  . HERNIA REPAIR  02/04/12  . i & d abdominal wound  02/19/12  . LEFT OOPHORECTOMY  2005  . PARTIAL COLECTOMY  about 2008   for colon cancer  . PARTIAL HYSTERECTOMY  1981   uterine cancer, R ovary remains  . Reexploration of abdominal wound and Allograft placemet  02/26/12  . Removal of infected mesh and abdominal wound vac placement  02/24/12  . sleep study  02/2014   OSA - AHI 55, nadir 81% [name])   Family History:  Family History  Problem Relation Age of Onset  . Colon cancer Mother 42  . Stomach cancer Mother        dx in her 29s?  . CAD Father        MI; deceased 69  . Mental illness Sister        anxiety/depression  . Thyroid disease Sister   . Breast cancer Maternal Grandmother        age at dx unknown  . Diabetes Brother    . Diabetes Brother   . Diabetes Other        aunts and uncles both sides  . Arthritis Other        strong fmhx  . Colon cancer Other 9       maternal half-sister; deceased   Family Psychiatric  History: bipolar, sister with depression Social History:  Social History   Substance and Sexual Activity  Alcohol Use No  . Alcohol/week: 0.0 oz     Social History   Substance and Sexual Activity  Drug Use No    Social History   Socioeconomic History  . Marital status: Single    Spouse name: Not on file  . Number of children: 0  . Years of education: Not on file  . Highest education level: Not on file  Occupational History  . Not on file  Social Needs  . Financial resource strain: Not on file  . Food insecurity:    Worry: Not on file    Inability: Not on file  . Transportation needs:    Medical: Not on file    Non-medical: Not on file  Tobacco Use  . Smoking status: Never Smoker  . Smokeless tobacco: Never Used  Substance and Sexual Activity  . Alcohol use: No    Alcohol/week: 0.0 oz  . Drug use: No  . Sexual activity: Never  Lifestyle  . Physical activity:    Days per week: Not on file    Minutes per session: Not on file  . Stress: Not on file  Relationships  . Social connections:    Talks on phone: Not on file    Gets together: Not on file    Attends religious service: Not on file    Active member of club or organization: Not on file    Attends meetings of clubs or organizations: Not on file    Relationship status: Not on file  Other Topics Concern  . Not on file  Social History Narrative   Lives with sister, no pets   Occupation: disabled, for bipolar and arthritis   Edu: GED   Activity: take walks   Diet: good water, vegetables daily   Religion: Catholic      Psych - [employer], Dr. Jimmye Norman (ph [phone])   Additional Social History:                         Sleep: Fair  Appetite:  Fair  Current Medications: Current  Facility-Administered Medications  Medication Dose Route Frequency Provider Last Rate Last Dose  . alum & mag hydroxide-simeth (MAALOX/MYLANTA) 200-200-20 MG/5ML suspension 30 mL  30 mL Oral Q4H PRN Clapacs, John T, MD      . diclofenac sodium (VOLTAREN) 1 % transdermal gel 2 g  2 g Topical QID Earlee Herald B, MD   2 g at 11/18/17 2131  . divalproex (DEPAKOTE) DR tablet 500 mg  500 mg Oral Q12H Clapacs, Madie Reno, MD   500 mg at 11/19/17 0825  . docusate sodium (COLACE) capsule 100 mg  100 mg Oral BID Clapacs, John T, MD   100 mg at 11/19/17 0825  . hydrOXYzine (ATARAX/VISTARIL) tablet 25 mg  25 mg Oral TID PRN Clapacs, John T, MD      . lamoTRIgine (LAMICTAL) tablet 50 mg  50 mg Oral BID Chaunte Hornbeck B, MD   50 mg at 11/19/17 0824  . levothyroxine (SYNTHROID, LEVOTHROID) tablet 50 mcg  50 mcg Oral QAC breakfast Massie Cogliano B, MD   50 mcg at 11/19/17 0630  . losartan (COZAAR) tablet 50 mg  50 mg Oral Daily Clapacs, Madie Reno, MD   50 mg at 11/19/17 0824  . magnesium citrate solution 1 Bottle  1 Bottle Oral Once Dakiyah Heinke B, MD      . magnesium hydroxide (MILK OF MAGNESIA) suspension 30 mL  30 mL Oral Daily PRN Clapacs, John T, MD      . metoprolol tartrate (LOPRESSOR) tablet 12.5 mg  12.5 mg Oral BID Clapacs, John T, MD   12.5 mg at 11/19/17 0825  . [START ON 12/10/2017] paliperidone (INVEGA SUSTENNA) injection 234 mg  234 mg Intramuscular Q28 days Shakyra Mattera B, MD      . polyethylene glycol (MIRALAX / GLYCOLAX) packet 17 g  17 g Oral Daily Ewel Lona  B, MD   17 g at 11/18/17 3086  . QUEtiapine (SEROQUEL) tablet 400 mg  400 mg Oral QHS Florence Antonelli B, MD   400 mg at 11/18/17 2130  . simvastatin (ZOCOR) tablet 20 mg  20 mg Oral q1800 Clapacs, Madie Reno, MD   20 mg at 11/18/17 1837  . traMADol-acetaminophen (ULTRACET) 37.5-325 MG per tablet 1 tablet  1 tablet Oral Q6H PRN Kaile Bixler B, MD   1 tablet at 11/19/17 0651  . traZODone (DESYREL) tablet  100 mg  100 mg Oral QHS Lenward Chancellor, MD   100 mg at 11/18/17 2130    Lab Results: No results found for this or any previous visit (from the past 48 hour(s)).  Blood Alcohol level:  Lab Results  Component Value Date   ETH <10 11/13/2017   ETH <10 57/84/6962    Metabolic Disorder Labs: Lab Results  Component Value Date   HGBA1C 5.9 (H) 11/14/2017   MPG 122.63 11/14/2017   MPG 114 04/14/2012   Lab Results  Component Value Date   PROLACTIN 33.6 (H) 12/04/2015   Lab Results  Component Value Date   CHOL 140 11/14/2017   TRIG 152 (H) 11/14/2017   HDL 50 11/14/2017   CHOLHDL 2.8 11/14/2017   VLDL 30 11/14/2017   LDLCALC 60 11/14/2017   LDLCALC 81 09/30/2017    Physical Findings: AIMS: Facial and Oral Movements Muscles of Facial Expression: None, normal Lips and Perioral Area: None, normal Jaw: None, normal Tongue: None, normal,Extremity Movements Upper (arms, wrists, hands, fingers): None, normal Lower (legs, knees, ankles, toes): None, normal, Trunk Movements Neck, shoulders, hips: None, normal, Overall Severity Severity of abnormal movements (highest score from questions above): None, normal Incapacitation due to abnormal movements: None, normal Patient's awareness of abnormal movements (rate only patient's report): No Awareness, Dental Status Current problems with teeth and/or dentures?: No Does patient usually wear dentures?: No  CIWA:    COWS:     Musculoskeletal: Strength & Muscle Tone: within normal limits Gait & Station: normal Patient leans: N/A  Psychiatric Specialty Exam: Physical Exam  Nursing note and vitals reviewed. Psychiatric: Her speech is normal and behavior is normal. Judgment normal. Her affect is labile and inappropriate. Thought content is paranoid and delusional. Cognition and memory are impaired.    Review of Systems  Gastrointestinal: Positive for abdominal pain.  Musculoskeletal: Positive for joint pain.  Neurological: Negative.    Psychiatric/Behavioral: Positive for memory loss.  All other systems reviewed and are negative.   Blood pressure 109/70, pulse 85, temperature 98.3 F (36.8 C), temperature source Oral, resp. rate 18, height 5\' 2"  (1.575 m), weight 87.1 kg (192 lb), SpO2 99 %.Body mass index is 35.12 kg/m.  General Appearance: Casual  Eye Contact:  Good  Speech:  Clear and Coherent  Volume:  Normal  Mood:  Dysphoric and Irritable  Affect:  Labile  Thought Process:  Goal Directed and Descriptions of Associations: Circumstantial  Orientation:  Full (Time, Place, and Person)  Thought Content:  Delusions and Paranoid Ideation  Suicidal Thoughts:  No  Homicidal Thoughts:  No  Memory:  Immediate;   Fair Recent;   Fair Remote;   Fair  Judgement:  Poor  Insight:  Shallow  Psychomotor Activity:  Normal  Concentration:  Concentration: Fair and Attention Span: Fair  Recall:  [employer] of Knowledge:  Fair  Language:  Fair  Akathisia:  No  Handed:  Right  AIMS (if indicated):     Assets:  Communication Skills Desire for Improvement Financial Resources/Insurance Housing Resilience Social Support  ADL's:  Intact  Cognition:  WNL  Sleep:  Number of Hours: 7     Treatment Plan Summary: Daily contact with patient to assess and evaluate symptoms and progress in treatment and Medication management   Gina [name] is a 67 year old female with a history of bipolar disorderwhoreturns to the hospital floridly manic with disturbing hallucinations and religious preoccupation.All manic symptoms continue.  #Mood/psychosis, still delusional, somatic, irritable   -continue Depakote 500 mg BID,VPA level 67, free VPA 17 -continueSeroquel to 400 mg nightly -continue Invega sustenna injectionsevery 28 days, next dose on 5/9 -increase Lamictal to 100 mg BID  #Insomnia, resolved with current treatment  #DM, stable -continue Metformin 500 mg BID -ADA diet, BCG  #HTN, stable on current  medications -continue Metorpolol 25 mg BID and Cozaar 50 mg daily  #Dyslipidemia, stable -continue Zocor 20 mg daily  #Hypothyidism -TSH slightly elevated at 5.8 -Synthroid 100 ug daily  #Arthritis, knee pain -discontinue Lodine  -start Voltaren gel to both knees -start Ultracet 37.5 mg TID PRN -PT consult is appreciated  #Constipation -continue Colace 100 mg BID and Miralax daily  #Sleep apnea, we are trying to establish respiratory care  -CPAPis not available on the unit  #Metabolic syndrome monitoring -Lipid panel shows slightly elevated TG, HgbA1C 5.8 -EKGreviewed, sinus brady of 59 with first degree AV block, QTc 461, asymptomatic  #Disposition -discharge to home with the sister -follow up with [facility] ACT team    Orson Slick, MD 11/19/2017, 10:08 AM

## 2017-11-19 NOTE — Plan of Care (Signed)
Patient slept for Estimated Hours of 7; Precautionary checks every 15 minutes for safety maintained, room free of safety hazards, patient sustains no injury or falls during this shift.  Problem: Spiritual Needs Goal: Ability to function at adequate level Outcome: Progressing   Problem: Education: Goal: Emotional status will improve Outcome: Progressing Goal: Mental status will improve Outcome: Progressing   Problem: Activity: Goal: Interest or engagement in activities will improve Outcome: Progressing Goal: Sleeping patterns will improve Outcome: Progressing   Problem: Health Behavior/Discharge Planning: Goal: Identification of resources available to assist in meeting health care needs will improve Outcome: Progressing Goal: Compliance with treatment plan for underlying cause of condition will improve Outcome: Progressing   Problem: Education: Goal: Utilization of techniques to improve thought processes will improve Outcome: Progressing   Problem: Coping: Goal: Coping ability will improve Outcome: Progressing Goal: Will verbalize feelings Outcome: Progressing   Problem: Health Behavior/Discharge Planning: Goal: Compliance with therapeutic regimen will improve Outcome: Progressing   Problem: Role Relationship: Goal: Will demonstrate positive changes in social behaviors and relationships Outcome: Progressing   Problem: Self-Concept: Goal: Will verbalize positive feelings about self Outcome: Progressing Goal: Level of anxiety will decrease Outcome: Progressing   Problem: Safety: Goal: Ability to remain free from injury will improve Outcome: Progressing

## 2017-11-19 NOTE — BHH Group Notes (Signed)
LCSW Group Therapy Note 11/19/2017 9:00 AM  Type of Therapy and Topic:  Group Therapy:  Setting Goals  Participation Level:  Minimal  Description of Group: In this process group, patients discussed using strengths to work toward goals and address challenges.  Patients identified two positive things about themselves and one goal they were working on.  Patients were given the opportunity to share openly and support each other's plan for self-empowerment.  The group discussed the value of gratitude and were encouraged to have a daily reflection of positive characteristics or circumstances.  Patients were encouraged to identify a plan to utilize their strengths to work on current challenges and goals.  Therapeutic Goals 1. Patient will verbalize personal strengths/positive qualities and relate how these can assist with achieving desired personal goals 2. Patients will verbalize affirmation of peers plans for personal change and goal setting 3. Patients will explore the value of gratitude and positive focus as related to successful achievement of goals 4. Patients will verbalize a plan for regular reinforcement of personal positive qualities and circumstances.  Summary of Patient Progress:  Gina [name] arrived late to group, but was able to participate some in the group discussion about developing SMART goals.  Gina [name] shared that her goal for today is "to work with the doctor to get rid of my pain".  She agreed that this was a specific goal that was relevant and realistic.     Therapeutic Modalities Cognitive Behavioral Therapy Motivational Interviewing    Devona Konig, Marlinda Mike 11/19/2017 3:18 PM

## 2017-11-19 NOTE — Plan of Care (Signed)
Patient has the ability to cope and function at an adequate level and has shown some interest in activities by attending/participating in unit groups and going outside to the courtyard with the other members on the unit without any issues thus far. Patient denies SI/HI/AVH. Patient rates her anxiety level a "8/10" reporting to this writer that she's anxious because "I think about my doctor bills and my ACT team is not treating me right". Patient rates his depression level a "7/10" stating that she feels this way because of "I miss my sister, she can't get well". Patient stated that she slept "ok" last night. Patient has the ability to identify the available resources that can assist her in meeting her health care needs. Patient has been in compliance with her therapeutic regimen and all questions and concerns have been addressed and answered at this time. Patient has demonstrated positive changes in social behaviors/relationships. Patient has remained free from injury thus far on the unit and remains safe at this time.   Problem: Spiritual Needs Goal: Ability to function at adequate level Outcome: Progressing   Problem: Education: Goal: Emotional status will improve Outcome: Progressing Goal: Mental status will improve Outcome: Progressing   Problem: Activity: Goal: Interest or engagement in activities will improve Outcome: Progressing Goal: Sleeping patterns will improve Outcome: Progressing   Problem: Health Behavior/Discharge Planning: Goal: Identification of resources available to assist in meeting health care needs will improve Outcome: Progressing Goal: Compliance with treatment plan for underlying cause of condition will improve Outcome: Progressing   Problem: Education: Goal: Utilization of techniques to improve thought processes will improve Outcome: Progressing   Problem: Coping: Goal: Coping ability will improve Outcome: Progressing Goal: Will verbalize feelings Outcome:  Progressing   Problem: Health Behavior/Discharge Planning: Goal: Compliance with therapeutic regimen will improve Outcome: Progressing   Problem: Role Relationship: Goal: Will demonstrate positive changes in social behaviors and relationships Outcome: Progressing   Problem: Self-Concept: Goal: Will verbalize positive feelings about self Outcome: Progressing Goal: Level of anxiety will decrease Outcome: Progressing   Problem: Safety: Goal: Ability to remain free from injury will improve Outcome: Progressing

## 2017-11-19 NOTE — BHH Group Notes (Signed)
[place] Group Notes:  (Nursing/MHT/Case Management/Adjunct)  Date:  11/19/2017  Time:  11:02 PM  Type of Therapy:  Group Therapy  Participation Level:  Active  Participation Quality:  Appropriate  Affect:  Appropriate  Cognitive:  Appropriate  Insight:  Good  Engagement in Group:  Engaged  Modes of Intervention:  Discussion  Summary of Progress/Problems:  Nehemiah Settle 11/19/2017, 11:02 PM

## 2017-11-19 NOTE — BHH Group Notes (Signed)
11/19/2017  Time: 1PM  Type of Therapy/Topic:  Group Therapy:  Balance in Life  Participation Level:  Minimal  Description of Group:   This group will address the concept of balance and how it feels and looks when one is unbalanced. Patients will be encouraged to process areas in their lives that are out of balance and identify reasons for remaining unbalanced. Facilitators will guide patients in utilizing problem-solving interventions to address and correct the stressor making their life unbalanced. Understanding and applying boundaries will be explored and addressed for obtaining and maintaining a balanced life. Patients will be encouraged to explore ways to assertively make their unbalanced needs known to significant others in their lives, using other group members and facilitator for support and feedback.  Therapeutic Goals: 1. Patient will identify two or more emotions or situations they have that consume much of in their lives. 2. Patient will identify signs/triggers that life has become out of balance:  3. Patient will identify two ways to set boundaries in order to achieve balance in their lives:  4. Patient will demonstrate ability to communicate their needs through discussion and/or role plays  Summary of Patient Progress: Pt continues to work towards their tx goals but has not yet reached them. Pt was able to appropriately participate in group discussion, and was able to offer support/validation to other group members. Pt reported one area of her life that takes up too much of her energy is, "my mental health." Pt reported one area of her life she'd like to devote more attention to is, "sleeping."    Therapeutic Modalities:   Cognitive Behavioral Therapy Solution-Focused Therapy Assertiveness Training   Alden Hipp, MSW, LCSW Clinical Social Worker 11/19/2017 2:03 PM

## 2017-11-19 NOTE — Progress Notes (Signed)
Recreation Therapy Notes  Date: 11/19/2017  Time: 9:30 am   Location: Craft Room   Behavioral response: N/A   Intervention Topic: Creative Expressions    Discussion/Intervention: Patient did not attend group.   Clinical Observations/Feedback:  Patient did not attend group.   Zakya Halabi LRT/CTRS        Ashunti Schofield 11/19/2017 12:22 PM

## 2017-11-19 NOTE — Progress Notes (Signed)
Patient ID: Gina [name], female   DOB: [birth year], 67 y.o.   MRN: [id number] Visible in the milieu, engaging, interacting well with peers, mood and affect appropriate, denied itching from left buttock cheek today, denied SI/HI/AVH.

## 2017-11-20 MED ORDER — DOCUSATE SODIUM 100 MG PO CAPS
200.0000 mg | ORAL_CAPSULE | Freq: Two times a day (BID) | ORAL | Status: DC
  Administered 2017-11-20 – 2017-11-24 (×8): 200 mg via ORAL
  Filled 2017-11-20 (×8): qty 2

## 2017-11-20 MED ORDER — BISACODYL 10 MG RE SUPP
10.0000 mg | Freq: Every day | RECTAL | Status: DC | PRN
  Filled 2017-11-20: qty 1

## 2017-11-20 MED ORDER — MAGNESIUM CITRATE PO SOLN
1.0000 | Freq: Once | ORAL | Status: DC
  Filled 2017-11-20: qty 296

## 2017-11-20 NOTE — Progress Notes (Signed)
CPAP set up by Ms Valeria Batman, RT; patient monitored remotely from the nurses' station.

## 2017-11-20 NOTE — Tx Team (Signed)
Interdisciplinary Treatment and Diagnostic Plan Update  11/20/2017 Time of Session: 10:40 am Gissel Keilman MRN: [id number]  Principal Diagnosis: Bipolar I disorder, most recent episode mixed, severe with psychotic features ([place])  Secondary Diagnoses: Principal Problem:   Bipolar I disorder, most recent episode mixed, severe with psychotic features ([place]) Active Problems:   HTN (hypertension), benign   Severe obesity (BMI 35.0-39.9) with comorbidity ([place])   Osteoarthritis of knee   Hyperlipidemia   Controlled type 2 diabetes mellitus with diabetic nephropathy (HCC)   Acquired hypothyroidism   Loss of memory   Current Medications:  Current Facility-Administered Medications  Medication Dose Route Frequency Provider Last Rate Last Dose  . alum & mag hydroxide-simeth (MAALOX/MYLANTA) 200-200-20 MG/5ML suspension 30 mL  30 mL Oral Q4H PRN Clapacs, John T, MD      . diclofenac sodium (VOLTAREN) 1 % transdermal gel 2 g  2 g Topical QID Pucilowska, Jolanta B, MD   2 g at 11/20/17 0905  . divalproex (DEPAKOTE) DR tablet 500 mg  500 mg Oral Q12H Clapacs, Madie Reno, MD   500 mg at 11/20/17 0905  . docusate sodium (COLACE) capsule 100 mg  100 mg Oral BID Clapacs, Madie Reno, MD   100 mg at 11/20/17 0904  . hydrOXYzine (ATARAX/VISTARIL) tablet 25 mg  25 mg Oral TID PRN Clapacs, John T, MD      . lamoTRIgine (LAMICTAL) tablet 100 mg  100 mg Oral BID Pucilowska, Jolanta B, MD   100 mg at 11/20/17 0905  . levothyroxine (SYNTHROID, LEVOTHROID) tablet 50 mcg  50 mcg Oral QAC breakfast Pucilowska, Jolanta B, MD   50 mcg at 11/20/17 0905  . losartan (COZAAR) tablet 50 mg  50 mg Oral Daily Clapacs, Madie Reno, MD   50 mg at 11/20/17 0904  . magnesium hydroxide (MILK OF MAGNESIA) suspension 30 mL  30 mL Oral Daily PRN Clapacs, John T, MD      . metoprolol tartrate (LOPRESSOR) tablet 12.5 mg  12.5 mg Oral BID Clapacs, Madie Reno, MD   12.5 mg at 11/20/17 0905  . [START ON 12/10/2017] paliperidone (INVEGA SUSTENNA)  injection 234 mg  234 mg Intramuscular Q28 days Pucilowska, Jolanta B, MD      . polyethylene glycol (MIRALAX / GLYCOLAX) packet 17 g  17 g Oral Daily Pucilowska, Jolanta B, MD   17 g at 11/20/17 0906  . QUEtiapine (SEROQUEL) tablet 400 mg  400 mg Oral QHS Pucilowska, Jolanta B, MD   400 mg at 11/19/17 2213  . simvastatin (ZOCOR) tablet 20 mg  20 mg Oral q1800 Clapacs, Madie Reno, MD   20 mg at 11/19/17 1731  . traMADol-acetaminophen (ULTRACET) 37.5-325 MG per tablet 1 tablet  1 tablet Oral Q6H PRN Pucilowska, Jolanta B, MD   1 tablet at 11/20/17 0944  . traZODone (DESYREL) tablet 100 mg  100 mg Oral QHS Lenward Chancellor, MD   100 mg at 11/19/17 2213   PTA Medications: Medications Prior to Admission  Medication Sig Dispense Refill Last Dose  . ACCU-CHEK AVIVA PLUS test strip 1 each by Other route daily. E11.21 100 each 3 Taking  . divalproex (DEPAKOTE) 500 MG DR tablet Take 1 tablet (500 mg total) by mouth 2 (two) times daily with a meal. 60 tablet 3 Taking  . docusate sodium (COLACE) 100 MG capsule Take 1 capsule (100 mg total) by mouth 2 (two) times daily. 30 capsule 1   . etodolac (LODINE) 200 MG capsule Take 1 capsule (200 mg total) by  mouth 2 (two) times daily. 60 capsule 3   . lamoTRIgine (LAMICTAL) 25 MG tablet Take 25 mg by mouth daily.   Taking  . levothyroxine (SYNTHROID, LEVOTHROID) 50 MCG tablet Take 1 tablet (50 mcg total) by mouth daily before breakfast. 30 tablet 11   . losartan (COZAAR) 50 MG tablet Take 1 tablet (50 mg total) by mouth daily. 30 tablet 11 Taking  . MELATONIN PO Take 1 tablet by mouth at bedtime.   Taking  . metFORMIN (GLUCOPHAGE) 500 MG tablet Take 1 tablet (500 mg total) by mouth daily with breakfast. 30 tablet 6 Taking  . metoprolol tartrate (LOPRESSOR) 25 MG tablet TAKE 1 & 1/2 TABLET BY MOUTH TWICE DAILY WITH FOOD 84 tablet 5 Taking  . nystatin (MYCOSTATIN/NYSTOP) powder Apply topically 3 (three) times daily. 60 g 1 Taking  . paliperidone (INVEGA SUSTENNA) 234  MG/1.5ML SUSP injection Inject 234 mg into the muscle every 30 (thirty) days.   Taking  . polyethylene glycol powder (GLYCOLAX/MIRALAX) powder TAKE 17 GRAMS BY MOUTH TWICE DAILY AS NEEDED FOR MODERATE CONSTIPATION. 527 g 3 Taking  . QUEtiapine (SEROQUEL) 300 MG tablet Take 1 tablet (300 mg total) by mouth at bedtime. 30 tablet 1   . senna (SENOKOT) 8.6 MG TABS tablet Take 1 tablet (8.6 mg total) by mouth at bedtime as needed for mild constipation. 120 each 0   . simvastatin (ZOCOR) 20 MG tablet Take 1 tablet (20 mg total) by mouth at bedtime. 30 tablet 11 Taking    Patient Stressors: Loss of parents Other: support  Patient Strengths: Ability for insight Communication skills Motivation for treatment/growth Religious Affiliation  Treatment Modalities: Medication Management, Group therapy, Case management,  1 to 1 session with clinician, Psychoeducation, Recreational therapy.   Physician Treatment Plan for Primary Diagnosis: Bipolar I disorder, most recent episode mixed, severe with psychotic features ([place]) Long Term Goal(s): Improvement in symptoms so as ready for discharge Improvement in symptoms so as ready for discharge   Short Term Goals: Ability to identify changes in lifestyle to reduce recurrence of condition will improve Ability to verbalize feelings will improve Ability to disclose and discuss suicidal ideas Ability to demonstrate self-control will improve Ability to identify and develop effective coping behaviors will improve Ability to maintain clinical measurements within normal limits will improve Compliance with prescribed medications will improve Ability to identify triggers associated with substance abuse/mental health issues will improve Ability to identify changes in lifestyle to reduce recurrence of condition will improve Ability to verbalize feelings will improve Ability to disclose and discuss suicidal ideas Ability to demonstrate self-control will  improve Ability to identify and develop effective coping behaviors will improve Ability to maintain clinical measurements within normal limits will improve Compliance with prescribed medications will improve Ability to identify triggers associated with substance abuse/mental health issues will improve  Medication Management: Evaluate patient's response, side effects, and tolerance of medication regimen.  Therapeutic Interventions: 1 to 1 sessions, Unit Group sessions and Medication administration.  Evaluation of Outcomes: Progressing  Physician Treatment Plan for Secondary Diagnosis: Principal Problem:   Bipolar I disorder, most recent episode mixed, severe with psychotic features ([place]) Active Problems:   HTN (hypertension), benign   Severe obesity (BMI 35.0-39.9) with comorbidity ([place])   Osteoarthritis of knee   Hyperlipidemia   Controlled type 2 diabetes mellitus with diabetic nephropathy (HCC)   Acquired hypothyroidism   Loss of memory  Long Term Goal(s): Improvement in symptoms so as ready for discharge Improvement in symptoms so as ready  for discharge   Short Term Goals: Ability to identify changes in lifestyle to reduce recurrence of condition will improve Ability to verbalize feelings will improve Ability to disclose and discuss suicidal ideas Ability to demonstrate self-control will improve Ability to identify and develop effective coping behaviors will improve Ability to maintain clinical measurements within normal limits will improve Compliance with prescribed medications will improve Ability to identify triggers associated with substance abuse/mental health issues will improve Ability to identify changes in lifestyle to reduce recurrence of condition will improve Ability to verbalize feelings will improve Ability to disclose and discuss suicidal ideas Ability to demonstrate self-control will improve Ability to identify and develop effective coping behaviors will  improve Ability to maintain clinical measurements within normal limits will improve Compliance with prescribed medications will improve Ability to identify triggers associated with substance abuse/mental health issues will improve     Medication Management: Evaluate patient's response, side effects, and tolerance of medication regimen.  Therapeutic Interventions: 1 to 1 sessions, Unit Group sessions and Medication administration.  Evaluation of Outcomes: Progressing   RN Treatment Plan for Primary Diagnosis: Bipolar I disorder, most recent episode mixed, severe with psychotic features ([place]) Long Term Goal(s): Knowledge of disease and therapeutic regimen to maintain health will improve  Short Term Goals: Ability to identify and develop effective coping behaviors will improve and Compliance with prescribed medications will improve  Medication Management: RN will administer medications as ordered by provider, will assess and evaluate patient's response and provide education to patient for prescribed medication. RN will report any adverse and/or side effects to prescribing provider.  Therapeutic Interventions: 1 on 1 counseling sessions, Psychoeducation, Medication administration, Evaluate responses to treatment, Monitor vital signs and CBGs as ordered, Perform/monitor CIWA, COWS, AIMS and Fall Risk screenings as ordered, Perform wound care treatments as ordered.  Evaluation of Outcomes: Progressing   LCSW Treatment Plan for Primary Diagnosis: Bipolar I disorder, most recent episode mixed, severe with psychotic features ([place]) Long Term Goal(s): Safe transition to appropriate next level of care at discharge, Engage patient in therapeutic group addressing interpersonal concerns.  Short Term Goals: Engage patient in aftercare planning with referrals and resources, Identify triggers associated with mental health/substance abuse issues and Increase skills for wellness and recovery  Therapeutic  Interventions: Assess for all discharge needs, 1 to 1 time with Social worker, Explore available resources and support systems, Assess for adequacy in community support network, Educate family and significant other(s) on suicide prevention, Complete Psychosocial Assessment, Interpersonal group therapy.  Evaluation of Outcomes: Progressing   Progress in Treatment: Attending groups: Yes. Participating in groups: Yes. Taking medication as prescribed: Yes. Toleration medication: Yes. Family/Significant other contact made: Yes, individual(s) contacted:  Pt's sister, Devoria Glassing, has been contacted by members on the team. Patient understands diagnosis: Yes. Discussing patient identified problems/goals with staff: Yes. Medical problems stabilized or resolved: Yes. Denies suicidal/homicidal ideation: Yes. Issues/concerns per patient self-inventory: No. Other:    New problem(s) identified: No, Describe:     New Short Term/Long Term Goal(s): to get feeling better, to get rid of headache. She says she would like to have her hat as she believes this makes her feel better.  Discharge Plan or Barriers: Pt continues to have somatic complaints although not as disorganized in her thinking. She still has delusional behavior.  Discharge plan continues to be that pt will go back to her home with her sister and follow up with [facility] ACT[facility] team  Reason for Continuation of Hospitalization: Delusions  Medication stabilization  Estimated Length of Stay: 5-7 days  Recreational Therapy: Patient Stressors: Could not sleep Patient Goal: Patient will focus on task/topic with 2 prompts from staff within 5 recreation therapy group sessions  Attendees: Patient: 11/20/2017 11:17 AM  Physician: Herma Ard Pucilowska,MD 11/20/2017 11:17 AM  Nursing: Jerry Caras, RN 11/20/2017 11:17 AM  RN Care Manager: 11/20/2017 11:17 AM  Social Worker: Derrek Gu, LCSW 11/20/2017 11:17 AM  Recreational Therapist: Roanna Epley, LRT  11/20/2017 11:17 AM  Other: Alden Hipp, LCSW 11/20/2017 11:17 AM  Other: Darin Engels, [place] 11/20/2017 11:17 AM  Other: 11/20/2017 11:17 AM    Scribe for Treatment Team: Devona Konig, LCSW 11/20/2017 11:17 AM

## 2017-11-20 NOTE — Progress Notes (Signed)
Pt was cooperative with treatment, pleasant on approach, no depressive symptoms, she was medication compliant on shift and she denied SI/HI/AVH. She appears to be resting in bed quietly at this time.

## 2017-11-20 NOTE — Progress Notes (Signed)
Recreation Therapy Notes  Date: 11/20/2017  Time: 9:30 am  Location: Craft Room  Behavioral response: Appropriate  Intervention Topic: Leisure  Discussion/Intervention:  Group content today was focused on leisure. The group defined what leisure is and some positive leisure activities they participate in. Individuals identified the difference between good and bad leisure. Participants expressed how they feel after participating in the leisure of their choice. The group discussed how they go about picking a leisure activity and if others are involved in their leisure activities. The patient stated how many leisure activities they too choose from and reasons why it is important to have leisure time. Individuals participated in the intervention "Leisure Jeopardy" where they had a chance to identify new leisure activities as well as benefits of leisure.  Clinical Observations/Feedback:  Patient came to group and stated that she participates in leisure to relax and not stress. Individual described that it is important to make time for leisure to get away from work.  Gina [name] LRT/CTRS         Claretha Townshend 11/20/2017 11:22 AM

## 2017-11-20 NOTE — [facility] Group Notes (Signed)
[facility] Group Notes:  (Nursing/MHT/Case Management/Adjunct)  Date:  11/20/2017  Time:  3:11 PM  Type of Therapy:  Psychoeducational Skills  Participation Level:  Did Not Attend   Gina [name] 11/20/2017, 3:11 PM

## 2017-11-20 NOTE — Plan of Care (Signed)
Patient slept for Estimated Hours of 7; Precautionary checks every 15 minutes for safety maintained, room free of safety hazards, patient sustains no injury or falls during this shift.  Problem: Spiritual Needs Goal: Ability to function at adequate level Outcome: Progressing   Problem: Education: Goal: Emotional status will improve Outcome: Progressing Goal: Mental status will improve Outcome: Progressing   Problem: Activity: Goal: Interest or engagement in activities will improve Outcome: Progressing Goal: Sleeping patterns will improve Outcome: Progressing   Problem: Health Behavior/Discharge Planning: Goal: Identification of resources available to assist in meeting health care needs will improve Outcome: Progressing Goal: Compliance with treatment plan for underlying cause of condition will improve Outcome: Progressing   Problem: Education: Goal: Utilization of techniques to improve thought processes will improve Outcome: Progressing   Problem: Coping: Goal: Coping ability will improve Outcome: Progressing Goal: Will verbalize feelings Outcome: Progressing   Problem: Health Behavior/Discharge Planning: Goal: Compliance with therapeutic regimen will improve Outcome: Progressing   Problem: Role Relationship: Goal: Will demonstrate positive changes in social behaviors and relationships Outcome: Progressing   Problem: Self-Concept: Goal: Will verbalize positive feelings about self Outcome: Progressing Goal: Level of anxiety will decrease Outcome: Progressing   Problem: Safety: Goal: Ability to remain free from injury will improve Outcome: Progressing

## 2017-11-20 NOTE — [facility] Suicide Risk Assessment (Signed)
[facility] INPATIENT:  Family/Significant Other Suicide Prevention Education  Suicide Prevention Education:  Education Completed; Devoria Glassing (sister at [phone]) has been identified by the patient as the family member/significant other with whom the patient will be residing, and identified as the person(s) who will aid the patient in the event of a mental health crisis (suicidal ideations/suicide attempt).  With written consent from the patient, the family member/significant other has been provided the following suicide prevention education, prior to the and/or following the discharge of the patient.  The suicide prevention education provided includes the following:  Suicide risk factors  Suicide prevention and interventions  National Suicide Hotline telephone number  [facility] assessment telephone number  [facility] Emergency Assistance [place] and/or Residential Mobile Crisis Unit telephone number  Request made of family/significant other to:  Remove weapons (e.g., guns, rifles, knives), all items previously/currently identified as safety concern.    Remove drugs/medications (over-the-counter, prescriptions, illicit drugs), all items previously/currently identified as a safety concern.  The family member/significant other verbalizes understanding of the suicide prevention education information provided.  The family member/significant other agrees to remove the items of safety concern listed above.  Devona Konig, LCSW 11/20/2017, 9:44 AM

## 2017-11-20 NOTE — [facility] Group Notes (Signed)

## 2017-11-20 NOTE — Progress Notes (Signed)
Patient ID: Gina [name], female   DOB: 1951-02-11, 67 y.o.   MRN: [id number] Bright affect and mood, enjoying her stay, pleasant, jovial, happy, laughs and smiles, no depressive symptoms, Pain managed with Voltaren and Ultracet for osteoarthritis 10/10 aching pain and discomfort; denied SI/HI/AVH.

## 2017-11-20 NOTE — Progress Notes (Signed)
[facility] MD Progress Note  11/20/2017 11:35 AM Gina [name]  MRN:  [id number]  Subjective:  Gina [name] was readmitted shortly after discharge from [facility] for symptoms of mania. She has been improving slowly.  Today the patient seems a touch confused. At one point she tells me that she is not ready for discharge, then asking to be discharged today so she can be home for Easter. 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 I wonder about her cognitions. On multiple occasions she tells me that she can't remember much.  He is happy about CPAP machine that eventually made its way here. Slept better. She complains of knee pain in spite of medications. She is preoccupied with treatment of abdominal hernia. She seems not to understand that we will not be addressing non urgent medical issues. There is no acute abdomen. Complains of constipation.   Principal Problem: Bipolar I disorder, most recent episode mixed, severe with psychotic features ([place]) Diagnosis:   Patient Active Problem List   Diagnosis Date Noted  . Bipolar I disorder, most recent episode mixed, severe with psychotic features ([place]) [F31.64] 11/13/2017    Priority: High  . Bipolar I disorder, current or most recent episode manic, with psychotic features ([place]) [F31.2] 11/04/2017    Priority: High  . Gallbladder polyp [K82.4] 10/21/2017  . Left shoulder pain [M25.512] 10/21/2017  . Benign neoplasm of ascending colon [D12.2]   . Bipolar affective disorder, current episode hypomanic ([place]) [F31.0] 04/08/2017  . Low back pain radiating to left lower extremity [M54.5, M79.605] 02/26/2017  . Acquired hypothyroidism [E03.9] 09/10/2016  . Thrombocytopenia ([place]) [D69.6] 06/15/2016  . Medicare annual wellness visit, subsequent [Z00.00] 06/04/2016  . Advanced care planning/counseling discussion [Z71.89] 06/04/2016  . External hemorrhoid [K64.4] 04/02/2016  . Slow transit constipation [K59.01] 01/04/2016  . Bipolar  I disorder, most recent episode (or current) manic ([place]) [F31.10] 12/03/2015  . NAFLD (nonalcoholic fatty liver disease) [K76.0] 03/28/2015  . Gallbladder calculus without cholecystitis [K80.20] 03/28/2015  . Gout [M10.9] 03/27/2015  . HCV antibody positive [R76.8] 03/27/2015  . History of bladder cancer [Z85.51] 03/27/2015  . History of bundle branch block [Z86.79] 03/27/2015  . Genetic testing [Z13.79] 02/28/2015  . Loss of memory [R41.3] 09/04/2014  . Controlled type 2 diabetes mellitus with diabetic nephropathy ([employer]) [E11.21] 06/07/2014  . Hyperlipidemia [E78.5]   . Anemia in CKD (chronic kidney disease) [N18.9, D63.1] 01/26/2013  . Personal history of colonic adenomas and colon cancer [Z86.010] 11/11/2012  . Osteoarthritis of knee [M17.10] 10/02/2012  . OSA on CPAP [G47.33, Z99.89]   . CKD (chronic kidney disease) stage 3, GFR 30-59 ml/min (HCC) [N18.3]   . Severe obesity (BMI 35.0-39.9) with comorbidity ([place]) [E66.01]   . HTN (hypertension), benign [I10] 04/07/2012  . Hx of colon cancer, stage I [Z85.038] 08/05/2011   Total Time spent with patient: 15 minutes  Past Psychiatric History: bipolar disorder  Past Medical History:  Past Medical History:  Diagnosis Date  . Anemia in chronic kidney disease   . Bipolar depression [facility])    sees psychiatrist - psych admission 03/2015  . Bladder cancer ([place]) 1990's  . Blood transfusion without reported diagnosis 2009  . Cataract    left  . CKD (chronic kidney disease) stage 3, GFR 30-59 ml/min (HCC)   . Colon cancer ([place]) 1990's  . DJD (degenerative joint disease), lumbar    chronic lower back pain  . Enterocutaneous fistula 04/07/2012   completed PT 06/2012 ([facility])  . Family history  of breast cancer   . Family history of colon cancer   . Family history of stomach cancer   . GERD (gastroesophageal reflux disease)   . History of bladder cancer 1997  . History of colon cancer    s/p surgery  . History of uterine cancer    s/p  hysterectomy  . Hyperlipidemia   . Hypertension   . IDA (iron deficiency anemia) 01/2013   thought due to h/o polyps  . Obesity (BMI 30-39.9)   . OSA on CPAP    6cm H2O  . Personal history of colonic adenomas and colon cancer 11/11/2012  . Positive hepatitis C antibody test 09/2014   but negative confirmatory testing  . RBBB   . Uncontrolled type 2 diabetes mellitus with nephropathy ([place]) 06/07/2014   completed DSME 02/2016    Past Surgical History:  Procedure Laterality Date  . BREAST BIOPSY Right 01/2014   fibroadenoma  . COLONOSCOPY  07/2009  . COLONOSCOPY  11/2012   2 tubular adenomas, mild diverticulosis, pending genetic testing for Lynch syndrome [name]) rpt 2 yrs  . COLONOSCOPY  02/2015   TA, diverticulosis, rpt 2 yrs [name])  . COLONOSCOPY WITH PROPOFOL N/A 06/09/2017   TAx1, rpt 2 yrs [name], [name], MD)  . DEXA  12/2009   WNL  . DOBUTAMINE STRESS ECHO  12/2009   no evidence of ischemia  . HERNIA REPAIR  02/04/12  . i & d abdominal wound  02/19/12  . LEFT OOPHORECTOMY  2005  . PARTIAL COLECTOMY  about 2008   for colon cancer  . PARTIAL HYSTERECTOMY  1981   uterine cancer, R ovary remains  . Reexploration of abdominal wound and Allograft placemet  02/26/12  . Removal of infected mesh and abdominal wound vac placement  02/24/12  . sleep study  02/2014   OSA - AHI 55, nadir 81% [name])   Family History:  Family History  Problem Relation Age of Onset  . Colon cancer Mother 37  . Stomach cancer Mother        dx in her 16s?  . CAD Father        MI; deceased 5  . Mental illness Sister        anxiety/depression  . Thyroid disease Sister   . Breast cancer Maternal Grandmother        age at dx unknown  . Diabetes Brother   . Diabetes Brother   . Diabetes Other        aunts and uncles both sides  . Arthritis Other        strong fmhx  . Colon cancer Other 71       maternal half-sister; deceased   Family Psychiatric  History: bipolar disorder Social History:   Social History   Substance and Sexual Activity  Alcohol Use No  . Alcohol/week: 0.0 oz     Social History   Substance and Sexual Activity  Drug Use No    Social History   Socioeconomic History  . Marital status: Single    Spouse name: Not on file  . Number of children: 0  . Years of education: Not on file  . Highest education level: Not on file  Occupational History  . Not on file  Social Needs  . Financial resource strain: Not on file  . Food insecurity:    Worry: Not on file    Inability: Not on file  . Transportation needs:    Medical: Not on file  Non-medical: Not on file  Tobacco Use  . Smoking status: Never Smoker  . Smokeless tobacco: Never Used  Substance and Sexual Activity  . Alcohol use: No    Alcohol/week: 0.0 oz  . Drug use: No  . Sexual activity: Never  Lifestyle  . Physical activity:    Days per week: Not on file    Minutes per session: Not on file  . Stress: Not on file  Relationships  . Social connections:    Talks on phone: Not on file    Gets together: Not on file    Attends religious service: Not on file    Active member of club or organization: Not on file    Attends meetings of clubs or organizations: Not on file    Relationship status: Not on file  Other Topics Concern  . Not on file  Social History Narrative   Lives with sister, no pets   Occupation: disabled, for bipolar and arthritis   Edu: GED   Activity: take walks   Diet: good water, vegetables daily   Religion: [place], Dr. Jimmye Norman (ph [phone])   Additional Social History:                         Sleep: Fair  Appetite:  Good  Current Medications: Current Facility-Administered Medications  Medication Dose Route Frequency Provider Last Rate Last Dose  . alum & mag hydroxide-simeth (MAALOX/MYLANTA) 200-200-20 MG/5ML suspension 30 mL  30 mL Oral Q4H PRN Clapacs, John T, MD      . bisacodyl (DULCOLAX) suppository  10 mg  10 mg Rectal Daily PRN Bralin Garry B, MD      . diclofenac sodium (VOLTAREN) 1 % transdermal gel 2 g  2 g Topical QID Sabastion Hrdlicka B, MD   2 g at 11/20/17 0905  . divalproex (DEPAKOTE) DR tablet 500 mg  500 mg Oral Q12H Clapacs, Madie Reno, MD   500 mg at 11/20/17 0905  . docusate sodium (COLACE) capsule 200 mg  200 mg Oral BID Jashaun Penrose B, MD      . hydrOXYzine (ATARAX/VISTARIL) tablet 25 mg  25 mg Oral TID PRN Clapacs, John T, MD      . lamoTRIgine (LAMICTAL) tablet 100 mg  100 mg Oral BID Ketzaly Cardella B, MD   100 mg at 11/20/17 0905  . levothyroxine (SYNTHROID, LEVOTHROID) tablet 50 mcg  50 mcg Oral QAC breakfast Appolonia Ackert B, MD   50 mcg at 11/20/17 0905  . losartan (COZAAR) tablet 50 mg  50 mg Oral Daily Clapacs, Madie Reno, MD   50 mg at 11/20/17 0904  . magnesium citrate solution 1 Bottle  1 Bottle Oral Once Nayel Purdy B, MD      . magnesium hydroxide (MILK OF MAGNESIA) suspension 30 mL  30 mL Oral Daily PRN Clapacs, John T, MD      . metoprolol tartrate (LOPRESSOR) tablet 12.5 mg  12.5 mg Oral BID Clapacs, John T, MD   12.5 mg at 11/20/17 0905  . [START ON 12/10/2017] paliperidone (INVEGA SUSTENNA) injection 234 mg  234 mg Intramuscular Q28 days Abbygael Curtiss B, MD      . polyethylene glycol (MIRALAX / GLYCOLAX) packet 17 g  17 g Oral Daily Analise Glotfelty B, MD   17 g at 11/20/17 0906  . QUEtiapine (SEROQUEL) tablet 400 mg  400 mg Oral QHS Emmylou Bieker B,  MD   400 mg at 11/19/17 2213  . simvastatin (ZOCOR) tablet 20 mg  20 mg Oral q1800 Clapacs, Madie Reno, MD   20 mg at 11/19/17 1731  . traMADol-acetaminophen (ULTRACET) 37.5-325 MG per tablet 1 tablet  1 tablet Oral Q6H PRN Morgon Pamer B, MD   1 tablet at 11/20/17 0944  . traZODone (DESYREL) tablet 100 mg  100 mg Oral QHS Lenward Chancellor, MD   100 mg at 11/19/17 2213    Lab Results: No results found for this or any previous visit (from the past 64 hour(s)).  Blood  Alcohol level:  Lab Results  Component Value Date   ETH <10 11/13/2017   ETH <10 16/05/9603    Metabolic Disorder Labs: Lab Results  Component Value Date   HGBA1C 5.9 (H) 11/14/2017   MPG 122.63 11/14/2017   MPG 114 04/14/2012   Lab Results  Component Value Date   PROLACTIN 33.6 (H) 12/04/2015   Lab Results  Component Value Date   CHOL 140 11/14/2017   TRIG 152 (H) 11/14/2017   HDL 50 11/14/2017   CHOLHDL 2.8 11/14/2017   VLDL 30 11/14/2017   LDLCALC 60 11/14/2017   LDLCALC 81 09/30/2017    Physical Findings: AIMS: Facial and Oral Movements Muscles of Facial Expression: None, normal Lips and Perioral Area: None, normal Jaw: None, normal Tongue: None, normal,Extremity Movements Upper (arms, wrists, hands, fingers): None, normal Lower (legs, knees, ankles, toes): None, normal, Trunk Movements Neck, shoulders, hips: None, normal, Overall Severity Severity of abnormal movements (highest score from questions above): None, normal Incapacitation due to abnormal movements: None, normal Patient's awareness of abnormal movements (rate only patient's report): No Awareness, Dental Status Current problems with teeth and/or dentures?: No Does patient usually wear dentures?: No  CIWA:    COWS:     Musculoskeletal: Strength & Muscle Tone: within normal limits Gait & Station: normal Patient leans: N/A  Psychiatric Specialty Exam: Physical Exam  Nursing note and vitals reviewed. Psychiatric: Her affect is inappropriate. Her speech is rapid and/or pressured. She is hyperactive. Thought content is paranoid and delusional. Cognition and memory are impaired. She expresses impulsivity.    Review of Systems  Neurological: Negative.   Psychiatric/Behavioral: The patient is nervous/anxious.   All other systems reviewed and are negative.   Blood pressure 126/70, pulse 90, temperature 98.6 F (37 C), temperature source Oral, resp. rate 18, height 5\' 2"  (1.575 m), weight 87.1 kg (192  lb), SpO2 98 %.Body mass index is 35.12 kg/m.  General Appearance: Casual  Eye Contact:  Good  Speech:  Clear and Coherent  Volume:  Increased  Mood:  Dysphoric and Irritable  Affect:  Congruent  Thought Process:  Goal Directed and Descriptions of Associations: Intact  Orientation:  Full (Time, Place, and Person)  Thought Content:  WDL  Suicidal Thoughts:  No  Homicidal Thoughts:  No  Memory:  Immediate;   Poor Recent;   Poor Remote;   Poor  Judgement:  Impaired  Insight:  Shallow  Psychomotor Activity:  Normal  Concentration:  Concentration: Poor and Attention Span: Poor  Recall:  Poor  Fund of Knowledge:  Poor  Language:  Fair  Akathisia:  No  Handed:  Right  AIMS (if indicated):     Assets:  Communication Skills Desire for Improvement Financial Resources/Insurance Housing Resilience Social Support  ADL's:  Intact  Cognition:  WNL  Sleep:  Number of Hours: 7     Treatment Plan Summary: Daily contact with patient to  assess and evaluate symptoms and progress in treatment and Medication management   Gina [name] is a 67 year old female with a history of bipolar disorderwhoreturns to the hospital floridly manic with disturbing hallucinations and religious preoccupation. Still intrusive and disorganized. I wander if cognitive dacline may be a part of the problem.   #Mood/psychosis, still delusional, somatic, irritable  -continue Depakote 500 mg BID,VPA level 67, free VPA 17 -continueSeroquel to 400 mg nightly -continue Invega sustenna injectionsevery 28 days, next dose on 5/9 -continue higher dose of Lamictal to 100 mg BID  #Insomnia, resolved with current treatment  #DM, stable -continue Metformin 500 mg BID -ADA diet, BCG  #HTN, stable on current medications -continue Metorpolol 25 mg BID and Cozaar 50 mg daily  #Dyslipidemia, stable -continue Zocor 20 mg daily  #Hypothyidism -TSH slightly elevated at 5.8 -Synthroid 100 ug  daily  #Arthritis, knee pain -discontinue Lodine -start Voltaren gel to both knees -start Ultracet 37.5 mg TID PRN -PT consult is appreciated  #Constipation -increase Colace to 200 mg BID -continue Miralax daily -offer Dulcolax suppository and Mag citrate PRN   #Sleep apnea, we are trying to establish respiratory care  -CPAPis not available on the unit  #Metabolic syndrome monitoring -Lipid panel shows slightly elevated TG, HgbA1C 5.8 -EKGreviewed, sinus brady of 59 with first degree AV block, QTc 461, asymptomatic  #Disposition -discharge to home with the sister -follow up with [facility] ACT team     Orson Slick, MD 11/20/2017, 11:35 AM

## 2017-11-21 MED ORDER — TRAMADOL-ACETAMINOPHEN 37.5-325 MG PO TABS
2.0000 | ORAL_TABLET | Freq: Three times a day (TID) | ORAL | Status: DC | PRN
  Administered 2017-11-21 – 2017-11-23 (×5): 2 via ORAL
  Filled 2017-11-21 (×5): qty 2

## 2017-11-21 NOTE — Progress Notes (Signed)
Patient ID: Gina [name], female   DOB: [birth year], 67 y.o.   MRN: [id number]   Per State regulations 482.30 this chart was reviewed for medical necessity with respect to the patient's admission/duration of stay.    Next review date: 11/25/17  Debarah Crape, BSN, RN-BC  Case Manager

## 2017-11-21 NOTE — Progress Notes (Signed)
[facility] MD Progress Note  11/21/2017 2:02 PM Gina [name]  MRN:  [id number] Subjective: Follow-up for this 67 year old woman with a history of bipolar disorder.  Patient is neatly dressed and basically taking care of herself.  Her chief complaint to me is that her knees are still hurting.  Patient's affect seems about the same as usual.  She is a little bit talkative but not bizarre or disorganized and not reporting current suicidal ideation. Principal Problem: Bipolar I disorder, most recent episode mixed, severe with psychotic features ([place]) Diagnosis:   Patient Active Problem List   Diagnosis Date Noted  . Bipolar I disorder, most recent episode mixed, severe with psychotic features ([place]) [F31.64] 11/13/2017  . Bipolar I disorder, current or most recent episode manic, with psychotic features ([place]) [F31.2] 11/04/2017  . Gallbladder polyp [K82.4] 10/21/2017  . Left shoulder pain [M25.512] 10/21/2017  . Benign neoplasm of ascending colon [D12.2]   . Bipolar affective disorder, current episode hypomanic ([place]) [F31.0] 04/08/2017  . Low back pain radiating to left lower extremity [M54.5, M79.605] 02/26/2017  . Acquired hypothyroidism [E03.9] 09/10/2016  . Thrombocytopenia ([place]) [D69.6] 06/15/2016  . Medicare annual wellness visit, subsequent [Z00.00] 06/04/2016  . Advanced care planning/counseling discussion [Z71.89] 06/04/2016  . External hemorrhoid [K64.4] 04/02/2016  . Slow transit constipation [K59.01] 01/04/2016  . Bipolar I disorder, most recent episode (or current) manic ([place]) [F31.10] 12/03/2015  . NAFLD (nonalcoholic fatty liver disease) [K76.0] 03/28/2015  . Gallbladder calculus without cholecystitis [K80.20] 03/28/2015  . Gout [M10.9] 03/27/2015  . HCV antibody positive [R76.8] 03/27/2015  . History of bladder cancer [Z85.51] 03/27/2015  . History of bundle branch block [Z86.79] 03/27/2015  . Genetic testing [Z13.79] 02/28/2015  . Loss of memory [R41.3] 09/04/2014  .  Controlled type 2 diabetes mellitus with diabetic nephropathy ([place]) [E11.21] 06/07/2014  . Hyperlipidemia [E78.5]   . Anemia in CKD (chronic kidney disease) [N18.9, D63.1] 01/26/2013  . Personal history of colonic adenomas and colon cancer [Z86.010] 11/11/2012  . Osteoarthritis of knee [M17.10] 10/02/2012  . OSA on CPAP [G47.33, Z99.89]   . CKD (chronic kidney disease) stage 3, GFR 30-59 ml/min (HCC) [N18.3]   . Severe obesity (BMI 35.0-39.9) with comorbidity ([place]) [E66.01]   . HTN (hypertension), benign [I10] 04/07/2012  . Hx of colon cancer, stage I [Z85.038] 08/05/2011   Total Time spent with patient: 30 minutes  Past Psychiatric History: Past history of bipolar disorder several hospitalizations chronic mental health issues  Past Medical History:  Past Medical History:  Diagnosis Date  . Anemia in chronic kidney disease   . Bipolar depression [facility])    sees psychiatrist - psych admission 03/2015  . Bladder cancer ([place]) 1990's  . Blood transfusion without reported diagnosis 2009  . Cataract    left  . CKD (chronic kidney disease) stage 3, GFR 30-59 ml/min (HCC)   . Colon cancer ([place]) 1990's  . DJD (degenerative joint disease), lumbar    chronic lower back pain  . Enterocutaneous fistula 04/07/2012   completed PT 06/2012 ([facility])  . Family history of breast cancer   . Family history of colon cancer   . Family history of stomach cancer   . GERD (gastroesophageal reflux disease)   . History of bladder cancer 1997  . History of colon cancer    s/p surgery  . History of uterine cancer    s/p hysterectomy  . Hyperlipidemia   . Hypertension   . IDA (iron deficiency anemia) 01/2013   thought due to h/o  polyps  . Obesity (BMI 30-39.9)   . OSA on CPAP    6cm H2O  . Personal history of colonic adenomas and colon cancer 11/11/2012  . Positive hepatitis C antibody test 09/2014   but negative confirmatory testing  . RBBB   . Uncontrolled type 2 diabetes mellitus with nephropathy  ([place]) 06/07/2014   completed DSME 02/2016    Past Surgical History:  Procedure Laterality Date  . BREAST BIOPSY Right 01/2014   fibroadenoma  . COLONOSCOPY  07/2009  . COLONOSCOPY  11/2012   2 tubular adenomas, mild diverticulosis, pending genetic testing for Lynch syndrome [name]) rpt 2 yrs  . COLONOSCOPY  02/2015   TA, diverticulosis, rpt 2 yrs [name])  . COLONOSCOPY WITH PROPOFOL N/A 06/09/2017   TAx1, rpt 2 yrs [name], [name], MD)  . DEXA  12/2009   WNL  . DOBUTAMINE STRESS ECHO  12/2009   no evidence of ischemia  . HERNIA REPAIR  02/04/12  . i & d abdominal wound  02/19/12  . LEFT OOPHORECTOMY  2005  . PARTIAL COLECTOMY  about 2008   for colon cancer  . PARTIAL HYSTERECTOMY  1981   uterine cancer, R ovary remains  . Reexploration of abdominal wound and Allograft placemet  02/26/12  . Removal of infected mesh and abdominal wound vac placement  02/24/12  . sleep study  02/2014   OSA - AHI 55, nadir 81% [name])   Family History:  Family History  Problem Relation Age of Onset  . Colon cancer Mother 45  . Stomach cancer Mother        dx in her 34s?  . CAD Father        MI; deceased 71  . Mental illness Sister        anxiety/depression  . Thyroid disease Sister   . Breast cancer Maternal Grandmother        age at dx unknown  . Diabetes Brother   . Diabetes Brother   . Diabetes Other        aunts and uncles both sides  . Arthritis Other        strong fmhx  . Colon cancer Other 76       maternal half-sister; deceased   Family Psychiatric  History: Positive also for bipolar disorder Social History:  Social History   Substance and Sexual Activity  Alcohol Use No  . Alcohol/week: 0.0 oz     Social History   Substance and Sexual Activity  Drug Use No    Social History   Socioeconomic History  . Marital status: Single    Spouse name: Not on file  . Number of children: 0  . Years of education: Not on file  . Highest education level: Not on file  Occupational  History  . Not on file  Social Needs  . Financial resource strain: Not on file  . Food insecurity:    Worry: Not on file    Inability: Not on file  . Transportation needs:    Medical: Not on file    Non-medical: Not on file  Tobacco Use  . Smoking status: Never Smoker  . Smokeless tobacco: Never Used  Substance and Sexual Activity  . Alcohol use: No    Alcohol/week: 0.0 oz  . Drug use: No  . Sexual activity: Never  Lifestyle  . Physical activity:    Days per week: Not on file    Minutes per session: Not on file  . Stress: Not on  file  Relationships  . Social connections:    Talks on phone: Not on file    Gets together: Not on file    Attends religious service: Not on file    Active member of club or organization: Not on file    Attends meetings of clubs or organizations: Not on file    Relationship status: Not on file  Other Topics Concern  . Not on file  Social History Narrative   Lives with sister, no pets   Occupation: disabled, for bipolar and arthritis   Edu: GED   Activity: take walks   Diet: good water, vegetables daily   Religion: Catholic      Psych - [employer], Dr. Jimmye Norman (ph [phone])   Additional Social History:                         Sleep: Fair  Appetite:  Fair  Current Medications: Current Facility-Administered Medications  Medication Dose Route Frequency Provider Last Rate Last Dose  . alum & mag hydroxide-simeth (MAALOX/MYLANTA) 200-200-20 MG/5ML suspension 30 mL  30 mL Oral Q4H PRN Jamarquis Crull T, MD      . bisacodyl (DULCOLAX) suppository 10 mg  10 mg Rectal Daily PRN Pucilowska, Jolanta B, MD      . diclofenac sodium (VOLTAREN) 1 % transdermal gel 2 g  2 g Topical QID Pucilowska, Jolanta B, MD   2 g at 11/21/17 1249  . divalproex (DEPAKOTE) DR tablet 500 mg  500 mg Oral Q12H Aveline Daus, Madie Reno, MD   500 mg at 11/21/17 0829  . docusate sodium (COLACE) capsule 200 mg  200 mg Oral BID Pucilowska, Jolanta B, MD   200  mg at 11/21/17 8867  . hydrOXYzine (ATARAX/VISTARIL) tablet 25 mg  25 mg Oral TID PRN Clorinda Wyble T, MD      . lamoTRIgine (LAMICTAL) tablet 100 mg  100 mg Oral BID Pucilowska, Jolanta B, MD   100 mg at 11/21/17 0830  . levothyroxine (SYNTHROID, LEVOTHROID) tablet 50 mcg  50 mcg Oral QAC breakfast Pucilowska, Jolanta B, MD   50 mcg at 11/21/17 0856  . losartan (COZAAR) tablet 50 mg  50 mg Oral Daily Estevon Fluke, Madie Reno, MD   50 mg at 11/21/17 0856  . magnesium citrate solution 1 Bottle  1 Bottle Oral Once Pucilowska, Jolanta B, MD      . magnesium hydroxide (MILK OF MAGNESIA) suspension 30 mL  30 mL Oral Daily PRN Vergia Chea T, MD      . metoprolol tartrate (LOPRESSOR) tablet 12.5 mg  12.5 mg Oral BID Idalie Canto T, MD   12.5 mg at 11/21/17 0830  . [START ON 12/10/2017] paliperidone (INVEGA SUSTENNA) injection 234 mg  234 mg Intramuscular Q28 days Pucilowska, Jolanta B, MD      . polyethylene glycol (MIRALAX / GLYCOLAX) packet 17 g  17 g Oral Daily Pucilowska, Jolanta B, MD   17 g at 11/21/17 0831  . QUEtiapine (SEROQUEL) tablet 400 mg  400 mg Oral QHS Pucilowska, Jolanta B, MD   400 mg at 11/20/17 2134  . simvastatin (ZOCOR) tablet 20 mg  20 mg Oral q1800 Latrell Reitan, Madie Reno, MD   20 mg at 11/20/17 1812  . traMADol-acetaminophen (ULTRACET) 37.5-325 MG per tablet 2 tablet  2 tablet Oral Q8H PRN Ellon Marasco T, MD      . traZODone (DESYREL) tablet 100 mg  100 mg Oral QHS Lenward Chancellor, MD  100 mg at 11/20/17 2134    Lab Results: No results found for this or any previous visit (from the past 48 hour(s)).  Blood Alcohol level:  Lab Results  Component Value Date   ETH <10 11/13/2017   ETH <10 91/69/4503    Metabolic Disorder Labs: Lab Results  Component Value Date   HGBA1C 5.9 (H) 11/14/2017   MPG 122.63 11/14/2017   MPG 114 04/14/2012   Lab Results  Component Value Date   PROLACTIN 33.6 (H) 12/04/2015   Lab Results  Component Value Date   CHOL 140 11/14/2017   TRIG 152 (H)  11/14/2017   HDL 50 11/14/2017   CHOLHDL 2.8 11/14/2017   VLDL 30 11/14/2017   LDLCALC 60 11/14/2017   LDLCALC 81 09/30/2017    Physical Findings: AIMS: Facial and Oral Movements Muscles of Facial Expression: None, normal Lips and Perioral Area: None, normal Jaw: None, normal Tongue: None, normal,Extremity Movements Upper (arms, wrists, hands, fingers): None, normal Lower (legs, knees, ankles, toes): None, normal, Trunk Movements Neck, shoulders, hips: None, normal, Overall Severity Severity of abnormal movements (highest score from questions above): None, normal Incapacitation due to abnormal movements: None, normal Patient's awareness of abnormal movements (rate only patient's report): No Awareness, Dental Status Current problems with teeth and/or dentures?: No Does patient usually wear dentures?: No  CIWA:    COWS:     Musculoskeletal: Strength & Muscle Tone: within normal limits Gait & Station: normal Patient leans: N/A  Psychiatric Specialty Exam: Physical Exam  Nursing note and vitals reviewed. Constitutional: She appears well-developed and well-nourished.  HENT:  Head: Normocephalic and atraumatic.  Eyes: Pupils are equal, round, and reactive to light. Conjunctivae are normal.  Neck: Normal range of motion.  Cardiovascular: Regular rhythm and normal heart sounds.  Respiratory: Effort normal. No respiratory distress.  GI: Soft.  Musculoskeletal: Normal range of motion.  Neurological: She is alert.  Skin: Skin is warm and dry.  Psychiatric: Her speech is normal and behavior is normal. Thought content normal. Her affect is blunt. Cognition and memory are impaired. She expresses impulsivity.    Review of Systems  Constitutional: Negative.   HENT: Negative.   Eyes: Negative.   Respiratory: Negative.   Cardiovascular: Negative.   Gastrointestinal: Negative.   Musculoskeletal: Positive for joint pain.  Skin: Negative.   Neurological: Negative.    Psychiatric/Behavioral: Negative for depression, hallucinations, memory loss, substance abuse and suicidal ideas. The patient is nervous/anxious. The patient does not have insomnia.     Blood pressure 119/65, pulse 80, temperature 98 F (36.7 C), temperature source Oral, resp. rate 18, height 5' 2"  (1.575 m), weight 87.1 kg (192 lb), SpO2 98 %.Body mass index is 35.12 kg/m.  General Appearance: Casual  Eye Contact:  Fair  Speech:  Normal Rate  Volume:  Normal  Mood:  Anxious  Affect:  Constricted  Thought Process:  Goal Directed  Orientation:  Full (Time, Place, and Person)  Thought Content:  Logical  Suicidal Thoughts:  No  Homicidal Thoughts:  No  Memory:  Immediate;   Fair Recent;   Fair Remote;   Fair  Judgement:  Impaired  Insight:  Fair  Psychomotor Activity:  Normal  Concentration:  Concentration: Fair  Recall:  [employer] of Knowledge:  Fair  Language:  Fair  Akathisia:  No  Handed:  Right  AIMS (if indicated):     Assets:  Desire for Improvement Housing Resilience Social Support  ADL's:  Intact  Cognition:  WNL  Sleep:  Number of Hours: 7     Treatment Plan Summary: Daily contact with patient to assess and evaluate symptoms and progress in treatment, Medication management and Plan Patient seems to be psychiatrically stabilizing.  She has a lot of somatic issues but today is focused on her knees.  I reviewed this with nursing.  I will increase the dose of the Ultracet to 2 tablets but only every 8 hours as needed so as to avoid any overdose.  If this does not help I am not sure that there is any other oral medicine that would be appropriate at this time.  For a patient who his knees hurt she spends a lot of time pacing around so I encouraged her to try and back off on that.  No other change to treatment today.  Alethia Berthold, MD 11/21/2017, 2:02 PM

## 2017-11-21 NOTE — [facility] Group Notes (Signed)
LCSW Group Therapy Note   11/21/2017 1:15pm   Type of Therapy and Topic:  Group Therapy:  Trust and Honesty  Participation Level:  None  Description of Group:    In this group patients will be asked to explore the value of being honest.  Patients will be guided to discuss their thoughts, feelings, and behaviors related to honesty and trusting in others. Patients will process together how trust and honesty relate to forming relationships with peers, family members, and self. Each patient will be challenged to identify and express feelings of being vulnerable. Patients will discuss reasons why people are dishonest and identify alternative outcomes if one was truthful (to self or others). This group will be process-oriented, with patients participating in exploration of their own experiences, giving and receiving support, and processing challenge from other group members.   Therapeutic Goals: 1. Patient will identify why honesty is important to relationships and how honesty overall affects relationships.  2. Patient will identify a situation where they lied or were lied too and the  feelings, thought process, and behaviors surrounding the situation 3. Patient will identify the meaning of being vulnerable, how that feels, and how that correlates to being honest with self and others. 4. Patient will identify situations where they could have told the truth, but instead lied and explain reasons of dishonesty.   Summary of Patient Progress: Pt came to group but left a few minutes after due to having a visitor. Pt did not return to group.    Therapeutic Modalities:   Cognitive Behavioral Therapy Solution Focused Therapy Motivational Interviewing Brief Therapy  Durrel Mcnee  CUEBAS-COLON, LCSW 11/21/2017 10:58 AM

## 2017-11-21 NOTE — Progress Notes (Signed)
D: patient found in her room this morning. Pt is compliant with medication administration. Pt denies any Si/hi/avh. Pt contracts for safety. Pt stated she slept well last night. Pt stated she is going to to speak to the doctor about getting something stronger for her knee pain.  A: patient given support and encouragement. Pt compliant with all scheduled meds. Q 15 minute checks continued. R: patient safe on the unit. Pt pleasant and interacting with peers. Will continue to monitor.

## 2017-11-22 NOTE — [facility] Group Notes (Signed)
LCSW Group Therapy Note 11/22/2017 11:00am  Type of Therapy and Topic: Group Therapy: Feelings Around Returning Home & Establishing a Supportive Framework and Supporting Oneself When Supports Not Available  Participation Level: Active  Description of Group:  Patients first processed thoughts and feelings about upcoming discharge. These included fears of upcoming changes, lack of change, new living environments, judgements and expectations from others and overall stigma of mental health issues. The group then discussed the definition of a supportive framework, what that looks and feels like, and how do to discern it from an unhealthy non-supportive network. The group identified different types of supports as well as what to do when your family/friends are less than helpful or unavailable  Therapeutic Goals  1. Patient will identify one healthy supportive network that they can use at discharge. 2. Patient will identify one factor of a supportive framework and how to tell it from an unhealthy network. 3. Patient able to identify one coping skill to use when they do not have positive supports from others. 4. Patient will demonstrate ability to communicate their needs through discussion and/or role plays.  Summary of Patient Progress:  The patient reported she feels "better today." Patient stated she slept better last night. Pt engaged during group session. As patients processed their anxiety about discharge and described healthy supports patient shared her frustrations about her medical insurance and her fears about not having the care she needs once she leaves the hospital. She reported she feels she is not ready to be discharge.  Patients identified at least one self-care tool they were willing to use after discharge.   Therapeutic Modalities Cognitive Behavioral Therapy Motivational Interviewing   Kaedin Hicklin  CUEBAS-COLON, LCSW 11/22/2017 9:14 AM

## 2017-11-22 NOTE — Plan of Care (Signed)
Patient is alert and oriented. Denies SI, HI and AVH. Patient complains of 10/10 knee pain which is being managed with Ultram and Voltaren cream. Patient is pleasant and participates in groups and interacts with peers appropriately.  Patient is compliant with medications and verbally acknowledges the reasons why medications are being given. Nurse will continue to monitor. Safety checks will continue Q 15 minutes. Problem: Spiritual Needs Goal: Ability to function at adequate level Outcome: Progressing   Problem: Education: Goal: Emotional status will improve Outcome: Progressing Goal: Mental status will improve Outcome: Progressing   Problem: Activity: Goal: Interest or engagement in activities will improve Outcome: Progressing Goal: Sleeping patterns will improve Outcome: Progressing

## 2017-11-22 NOTE — Progress Notes (Signed)
[facility] MD Progress Note  11/22/2017 10:17 AM Gina [name]  MRN:  [id number] Subjective: Follow-up 67 year old woman with bipolar disorder.  Patient is neatly dressed and groomed.  Up out of bed most of the time.  Appropriately interactive.  Mood is stated as being okay but the patient is still fixated on the pain in her knees.  She jumps straight to saying that she thinks that she needs to stay in the hospital for quite a long time.  I think she really enjoys being here it is probably a lot more stimulating than home life. Principal Problem: Bipolar I disorder, most recent episode mixed, severe with psychotic features ([place]) Diagnosis:   Patient Active Problem List   Diagnosis Date Noted  . Bipolar I disorder, most recent episode mixed, severe with psychotic features ([place]) [F31.64] 11/13/2017  . Bipolar I disorder, current or most recent episode manic, with psychotic features ([place]) [F31.2] 11/04/2017  . Gallbladder polyp [K82.4] 10/21/2017  . Left shoulder pain [M25.512] 10/21/2017  . Benign neoplasm of ascending colon [D12.2]   . Bipolar affective disorder, current episode hypomanic ([place]) [F31.0] 04/08/2017  . Low back pain radiating to left lower extremity [M54.5, M79.605] 02/26/2017  . Acquired hypothyroidism [E03.9] 09/10/2016  . Thrombocytopenia ([place]) [D69.6] 06/15/2016  . Medicare annual wellness visit, subsequent [Z00.00] 06/04/2016  . Advanced care planning/counseling discussion [Z71.89] 06/04/2016  . External hemorrhoid [K64.4] 04/02/2016  . Slow transit constipation [K59.01] 01/04/2016  . Bipolar I disorder, most recent episode (or current) manic ([place]) [F31.10] 12/03/2015  . NAFLD (nonalcoholic fatty liver disease) [K76.0] 03/28/2015  . Gallbladder calculus without cholecystitis [K80.20] 03/28/2015  . Gout [M10.9] 03/27/2015  . HCV antibody positive [R76.8] 03/27/2015  . History of bladder cancer [Z85.51] 03/27/2015  . History of bundle branch block [Z86.79] 03/27/2015  .  Genetic testing [Z13.79] 02/28/2015  . Loss of memory [R41.3] 09/04/2014  . Controlled type 2 diabetes mellitus with diabetic nephropathy ([place]) [E11.21] 06/07/2014  . Hyperlipidemia [E78.5]   . Anemia in CKD (chronic kidney disease) [N18.9, D63.1] 01/26/2013  . Personal history of colonic adenomas and colon cancer [Z86.010] 11/11/2012  . Osteoarthritis of knee [M17.10] 10/02/2012  . OSA on CPAP [G47.33, Z99.89]   . CKD (chronic kidney disease) stage 3, GFR 30-59 ml/min (HCC) [N18.3]   . Severe obesity (BMI 35.0-39.9) with comorbidity ([place]) [E66.01]   . HTN (hypertension), benign [I10] 04/07/2012  . Hx of colon cancer, stage I [Z85.038] 08/05/2011   Total Time spent with patient: 30 minutes  Past Psychiatric History: Long history of chronic mental health problems  Past Medical History:  Past Medical History:  Diagnosis Date  . Anemia in chronic kidney disease   . Bipolar depression [facility])    sees psychiatrist - psych admission 03/2015  . Bladder cancer ([place]) 1990's  . Blood transfusion without reported diagnosis 2009  . Cataract    left  . CKD (chronic kidney disease) stage 3, GFR 30-59 ml/min (HCC)   . Colon cancer ([place]) 1990's  . DJD (degenerative joint disease), lumbar    chronic lower back pain  . Enterocutaneous fistula 04/07/2012   completed PT 06/2012 ([facility])  . Family history of breast cancer   . Family history of colon cancer   . Family history of stomach cancer   . GERD (gastroesophageal reflux disease)   . History of bladder cancer 1997  . History of colon cancer    s/p surgery  . History of uterine cancer    s/p hysterectomy  . Hyperlipidemia   .  Hypertension   . IDA (iron deficiency anemia) 01/2013   thought due to h/o polyps  . Obesity (BMI 30-39.9)   . OSA on CPAP    6cm H2O  . Personal history of colonic adenomas and colon cancer 11/11/2012  . Positive hepatitis C antibody test 09/2014   but negative confirmatory testing  . RBBB   . Uncontrolled type 2  diabetes mellitus with nephropathy ([place]) 06/07/2014   completed DSME 02/2016    Past Surgical History:  Procedure Laterality Date  . BREAST BIOPSY Right 01/2014   fibroadenoma  . COLONOSCOPY  07/2009  . COLONOSCOPY  11/2012   2 tubular adenomas, mild diverticulosis, pending genetic testing for Lynch syndrome [name]) rpt 2 yrs  . COLONOSCOPY  02/2015   TA, diverticulosis, rpt 2 yrs [name])  . COLONOSCOPY WITH PROPOFOL N/A 06/09/2017   TAx1, rpt 2 yrs [name], [name], MD)  . DEXA  12/2009   WNL  . DOBUTAMINE STRESS ECHO  12/2009   no evidence of ischemia  . HERNIA REPAIR  02/04/12  . i & d abdominal wound  02/19/12  . LEFT OOPHORECTOMY  2005  . PARTIAL COLECTOMY  about 2008   for colon cancer  . PARTIAL HYSTERECTOMY  1981   uterine cancer, R ovary remains  . Reexploration of abdominal wound and Allograft placemet  02/26/12  . Removal of infected mesh and abdominal wound vac placement  02/24/12  . sleep study  02/2014   OSA - AHI 55, nadir 81% [name])   Family History:  Family History  Problem Relation Age of Onset  . Colon cancer Mother 74  . Stomach cancer Mother        dx in her 63s?  . CAD Father        MI; deceased 30  . Mental illness Sister        anxiety/depression  . Thyroid disease Sister   . Breast cancer Maternal Grandmother        age at dx unknown  . Diabetes Brother   . Diabetes Brother   . Diabetes Other        aunts and uncles both sides  . Arthritis Other        strong fmhx  . Colon cancer Other 57       maternal half-sister; deceased   Family Psychiatric  History: Positive Social History:  Social History   Substance and Sexual Activity  Alcohol Use No  . Alcohol/week: 0.0 oz     Social History   Substance and Sexual Activity  Drug Use No    Social History   Socioeconomic History  . Marital status: Single    Spouse name: Not on file  . Number of children: 0  . Years of education: Not on file  . Highest education level: Not on file   Occupational History  . Not on file  Social Needs  . Financial resource strain: Not on file  . Food insecurity:    Worry: Not on file    Inability: Not on file  . Transportation needs:    Medical: Not on file    Non-medical: Not on file  Tobacco Use  . Smoking status: Never Smoker  . Smokeless tobacco: Never Used  Substance and Sexual Activity  . Alcohol use: No    Alcohol/week: 0.0 oz  . Drug use: No  . Sexual activity: Never  Lifestyle  . Physical activity:    Days per week: Not on file  Minutes per session: Not on file  . Stress: Not on file  Relationships  . Social connections:    Talks on phone: Not on file    Gets together: Not on file    Attends religious service: Not on file    Active member of club or organization: Not on file    Attends meetings of clubs or organizations: Not on file    Relationship status: Not on file  Other Topics Concern  . Not on file  Social History Narrative   Lives with sister, no pets   Occupation: disabled, for bipolar and arthritis   Edu: GED   Activity: take walks   Diet: good water, vegetables daily   Religion: Catholic      Psych - [employer], Dr. Jimmye Norman (ph [phone])   Additional Social History:                         Sleep: Fair  Appetite:  Fair  Current Medications: Current Facility-Administered Medications  Medication Dose Route Frequency Provider Last Rate Last Dose  . alum & mag hydroxide-simeth (MAALOX/MYLANTA) 200-200-20 MG/5ML suspension 30 mL  30 mL Oral Q4H PRN Puanani Gene T, MD      . bisacodyl (DULCOLAX) suppository 10 mg  10 mg Rectal Daily PRN Pucilowska, Jolanta B, MD      . diclofenac sodium (VOLTAREN) 1 % transdermal gel 2 g  2 g Topical QID Pucilowska, Jolanta B, MD   2 g at 11/22/17 0905  . divalproex (DEPAKOTE) DR tablet 500 mg  500 mg Oral Q12H Margie Brink, Madie Reno, MD   500 mg at 11/22/17 0904  . docusate sodium (COLACE) capsule 200 mg  200 mg Oral BID Pucilowska,  Jolanta B, MD   200 mg at 11/22/17 0903  . hydrOXYzine (ATARAX/VISTARIL) tablet 25 mg  25 mg Oral TID PRN Karl Erway, Madie Reno, MD   25 mg at 11/21/17 2124  . lamoTRIgine (LAMICTAL) tablet 100 mg  100 mg Oral BID Pucilowska, Jolanta B, MD   100 mg at 11/22/17 0903  . levothyroxine (SYNTHROID, LEVOTHROID) tablet 50 mcg  50 mcg Oral QAC breakfast Pucilowska, Jolanta B, MD   50 mcg at 11/22/17 0904  . losartan (COZAAR) tablet 50 mg  50 mg Oral Daily Latayvia Mandujano, Madie Reno, MD   50 mg at 11/22/17 0904  . magnesium citrate solution 1 Bottle  1 Bottle Oral Once Pucilowska, Jolanta B, MD      . magnesium hydroxide (MILK OF MAGNESIA) suspension 30 mL  30 mL Oral Daily PRN Jerrett Baldinger T, MD      . metoprolol tartrate (LOPRESSOR) tablet 12.5 mg  12.5 mg Oral BID Riann Oman, Madie Reno, MD   12.5 mg at 11/22/17 0904  . [START ON 12/10/2017] paliperidone (INVEGA SUSTENNA) injection 234 mg  234 mg Intramuscular Q28 days Pucilowska, Jolanta B, MD      . polyethylene glycol (MIRALAX / GLYCOLAX) packet 17 g  17 g Oral Daily Pucilowska, Jolanta B, MD   17 g at 11/21/17 0831  . QUEtiapine (SEROQUEL) tablet 400 mg  400 mg Oral QHS Pucilowska, Jolanta B, MD   400 mg at 11/21/17 2123  . simvastatin (ZOCOR) tablet 20 mg  20 mg Oral q1800 Dorothey Oetken, Madie Reno, MD   20 mg at 11/21/17 1700  . traMADol-acetaminophen (ULTRACET) 37.5-325 MG per tablet 2 tablet  2 tablet Oral Q8H PRN Evangelia Whitaker, Madie Reno, MD   2 tablet at 11/21/17  2124  . traZODone (DESYREL) tablet 100 mg  100 mg Oral QHS Lenward Chancellor, MD   100 mg at 11/21/17 2123    Lab Results: No results found for this or any previous visit (from the past 48 hour(s)).  Blood Alcohol level:  Lab Results  Component Value Date   ETH <10 11/13/2017   ETH <10 67/67/2094    Metabolic Disorder Labs: Lab Results  Component Value Date   HGBA1C 5.9 (H) 11/14/2017   MPG 122.63 11/14/2017   MPG 114 04/14/2012   Lab Results  Component Value Date   PROLACTIN 33.6 (H) 12/04/2015   Lab Results   Component Value Date   CHOL 140 11/14/2017   TRIG 152 (H) 11/14/2017   HDL 50 11/14/2017   CHOLHDL 2.8 11/14/2017   VLDL 30 11/14/2017   LDLCALC 60 11/14/2017   LDLCALC 81 09/30/2017    Physical Findings: AIMS: Facial and Oral Movements Muscles of Facial Expression: None, normal Lips and Perioral Area: None, normal Jaw: None, normal Tongue: None, normal,Extremity Movements Upper (arms, wrists, hands, fingers): None, normal Lower (legs, knees, ankles, toes): None, normal, Trunk Movements Neck, shoulders, hips: None, normal, Overall Severity Severity of abnormal movements (highest score from questions above): None, normal Incapacitation due to abnormal movements: None, normal Patient's awareness of abnormal movements (rate only patient's report): No Awareness, Dental Status Current problems with teeth and/or dentures?: No Does patient usually wear dentures?: No  CIWA:    COWS:     Musculoskeletal: Strength & Muscle Tone: within normal limits Gait & Station: normal Patient leans: N/A  Psychiatric Specialty Exam: Physical Exam  Nursing note and vitals reviewed. Constitutional: She appears well-developed and well-nourished.  HENT:  Head: Normocephalic and atraumatic.  Eyes: Pupils are equal, round, and reactive to light. Conjunctivae are normal.  Neck: Normal range of motion.  Cardiovascular: Regular rhythm and normal heart sounds.  Respiratory: Effort normal. No respiratory distress.  GI: Soft.  Musculoskeletal: Normal range of motion.  Neurological: She is alert.  Skin: Skin is warm and dry.  Psychiatric: Her affect is blunt. Her speech is delayed. She is slowed. Thought content is not paranoid. Cognition and memory are normal. She expresses inappropriate judgment. She expresses no homicidal and no suicidal ideation.    Review of Systems  Constitutional: Negative.   HENT: Negative.   Eyes: Negative.   Respiratory: Negative.   Cardiovascular: Negative.    Gastrointestinal: Negative.   Musculoskeletal: Negative.   Skin: Negative.   Neurological: Negative.   Psychiatric/Behavioral: Negative for depression, hallucinations, memory loss, substance abuse and suicidal ideas. The patient is not nervous/anxious and does not have insomnia.     Blood pressure (!) 109/96, pulse 94, temperature 98.1 F (36.7 C), temperature source Oral, resp. rate 18, height 5' 2"  (1.575 m), weight 87.1 kg (192 lb), SpO2 95 %.Body mass index is 35.12 kg/m.  General Appearance: Casual  Eye Contact:  Good  Speech:  Slow  Volume:  Decreased  Mood:  Euthymic  Affect:  Constricted  Thought Process:  Goal Directed  Orientation:  Full (Time, Place, and Person)  Thought Content:  Logical  Suicidal Thoughts:  No  Homicidal Thoughts:  No  Memory:  Immediate;   Fair Recent;   Fair Remote;   Fair  Judgement:  Fair  Insight:  Fair  Psychomotor Activity:  Normal  Concentration:  Concentration: Fair  Recall:  [employer] of Knowledge:  Fair  Language:  Fair  Akathisia:  No  Handed:  Right  AIMS (if indicated):     Assets:  Desire for Improvement Housing Resilience  ADL's:  Intact  Cognition:  WNL  Sleep:  Number of Hours: 7     Treatment Plan Summary: Daily contact with patient to assess and evaluate symptoms and progress in treatment, Medication management and Plan Pleasant older woman who has minor physical complaints still but mentally seems to be stable.  Patient I think really does enjoy being back in the hospital.  Definitely she is in no rush to be discharged.  Encourage patient to be positive in her thoughts about the future and to think about discharge planning.  No change to medicine.  Alethia Berthold, MD 11/22/2017, 10:17 AM

## 2017-11-22 NOTE — Plan of Care (Signed)
Pt is medication compliant. Pt complained of back and knee pain. Pt given prn per MAR. Pt slept with CPAP and without difficulties. Remains on Q 15 mins safety rounds.  Problem: Spiritual Needs Goal: Ability to function at adequate level Outcome: Progressing   Problem: Education: Goal: Emotional status will improve Outcome: Progressing Goal: Mental status will improve Outcome: Progressing   Problem: Activity: Goal: Interest or engagement in activities will improve Outcome: Progressing Goal: Sleeping patterns will improve Outcome: Progressing   Problem: Health Behavior/Discharge Planning: Goal: Identification of resources available to assist in meeting health care needs will improve Outcome: Progressing Goal: Compliance with treatment plan for underlying cause of condition will improve Outcome: Progressing   Problem: Education: Goal: Utilization of techniques to improve thought processes will improve Outcome: Progressing   Problem: Coping: Goal: Coping ability will improve Outcome: Progressing Goal: Will verbalize feelings Outcome: Progressing   Problem: Health Behavior/Discharge Planning: Goal: Compliance with therapeutic regimen will improve Outcome: Progressing   Problem: Role Relationship: Goal: Will demonstrate positive changes in social behaviors and relationships Outcome: Progressing   Problem: Self-Concept: Goal: Will verbalize positive feelings about self Outcome: Progressing Goal: Level of anxiety will decrease Outcome: Progressing   Problem: Safety: Goal: Ability to remain free from injury will improve Outcome: Progressing

## 2017-11-23 ENCOUNTER — Telehealth: Payer: Self-pay | Admitting: Family Medicine

## 2017-11-23 MED ORDER — QUETIAPINE FUMARATE 200 MG PO TABS
300.0000 mg | ORAL_TABLET | Freq: Every day | ORAL | Status: DC
  Administered 2017-11-23: 300 mg via ORAL
  Filled 2017-11-23: qty 1

## 2017-11-23 MED ORDER — LAMOTRIGINE 100 MG PO TABS: 100.0000 mg | ORAL_TABLET | Freq: Two times a day (BID) | ORAL | 1 refills | Status: DC

## 2017-11-23 MED ORDER — BISACODYL 10 MG RE SUPP: 10.0000 mg | Freq: Every day | RECTAL | 0 refills | Status: AC | PRN

## 2017-11-23 MED ORDER — METOPROLOL TARTRATE 25 MG PO TABS: 12.5000 mg | ORAL_TABLET | Freq: Two times a day (BID) | ORAL | 1 refills | Status: AC

## 2017-11-23 MED ORDER — DOCUSATE SODIUM 100 MG PO CAPS: 200.0000 mg | ORAL_CAPSULE | Freq: Two times a day (BID) | ORAL | 1 refills | Status: AC

## 2017-11-23 NOTE — Progress Notes (Signed)
[facility] MD Progress Note  11/23/2017 11:46 AM Gina [name]  MRN:  [id number]  Subjective:  Gina [name] is not feeling well today and complains of daytime somnolence asking to decrease her medications. She is still preoccupied with her hernia that "could get worse". She spoke about her hernia with her PCP many times and knows that it may require elective surgery. She is no longer constipated. She has been complaining of her knee pain. She was evaluated by PT. Short of knee replacement, there is no treatment. She says she does not benefit from Ultracet or Voltaren gel and is asking for something "stronger". She is also interested in additional health insurance to avoid Medicare copays.   Principal Problem: Bipolar I disorder, most recent episode mixed, severe with psychotic features ([place]) Diagnosis:   Patient Active Problem List   Diagnosis Date Noted  . Bipolar I disorder, most recent episode mixed, severe with psychotic features ([place]) [F31.64] 11/13/2017    Priority: High  . Bipolar I disorder, current or most recent episode manic, with psychotic features ([place]) [F31.2] 11/04/2017    Priority: High  . Gallbladder polyp [K82.4] 10/21/2017  . Left shoulder pain [M25.512] 10/21/2017  . Benign neoplasm of ascending colon [D12.2]   . Bipolar affective disorder, current episode hypomanic ([place]) [F31.0] 04/08/2017  . Low back pain radiating to left lower extremity [M54.5, M79.605] 02/26/2017  . Acquired hypothyroidism [E03.9] 09/10/2016  . Thrombocytopenia ([place]) [D69.6] 06/15/2016  . Medicare annual wellness visit, subsequent [Z00.00] 06/04/2016  . Advanced care planning/counseling discussion [Z71.89] 06/04/2016  . External hemorrhoid [K64.4] 04/02/2016  . Slow transit constipation [K59.01] 01/04/2016  . Bipolar I disorder, most recent episode (or current) manic ([place]) [F31.10] 12/03/2015  . NAFLD (nonalcoholic fatty liver disease) [K76.0] 03/28/2015  . Gallbladder calculus without  cholecystitis [K80.20] 03/28/2015  . Gout [M10.9] 03/27/2015  . HCV antibody positive [R76.8] 03/27/2015  . History of bladder cancer [Z85.51] 03/27/2015  . History of bundle branch block [Z86.79] 03/27/2015  . Genetic testing [Z13.79] 02/28/2015  . Loss of memory [R41.3] 09/04/2014  . Controlled type 2 diabetes mellitus with diabetic nephropathy ([place]) [E11.21] 06/07/2014  . Hyperlipidemia [E78.5]   . Anemia in CKD (chronic kidney disease) [N18.9, D63.1] 01/26/2013  . Personal history of colonic adenomas and colon cancer [Z86.010] 11/11/2012  . Osteoarthritis of knee [M17.10] 10/02/2012  . OSA on CPAP [G47.33, Z99.89]   . CKD (chronic kidney disease) stage 3, GFR 30-59 ml/min (HCC) [N18.3]   . Severe obesity (BMI 35.0-39.9) with comorbidity ([place]) [E66.01]   . HTN (hypertension), benign [I10] 04/07/2012  . Hx of colon cancer, stage I [Z85.038] 08/05/2011   Total Time spent with patient: 20 minutes  Past Psychiatric History: bipolar disorder   Past Medical History:  Past Medical History:  Diagnosis Date  . Anemia in chronic kidney disease   . Bipolar depression [facility])    sees psychiatrist - psych admission 03/2015  . Bladder cancer ([place]) 1990's  . Blood transfusion without reported diagnosis 2009  . Cataract    left  . CKD (chronic kidney disease) stage 3, GFR 30-59 ml/min (HCC)   . Colon cancer ([place]) 1990's  . DJD (degenerative joint disease), lumbar    chronic lower back pain  . Enterocutaneous fistula 04/07/2012   completed PT 06/2012 ([facility])  . Family history of breast cancer   . Family history of colon cancer   . Family history of stomach cancer   . GERD (gastroesophageal reflux disease)   . History of bladder  cancer 1997  . History of colon cancer    s/p surgery  . History of uterine cancer    s/p hysterectomy  . Hyperlipidemia   . Hypertension   . IDA (iron deficiency anemia) 01/2013   thought due to h/o polyps  . Obesity (BMI 30-39.9)   . OSA on CPAP    6cm  H2O  . Personal history of colonic adenomas and colon cancer 11/11/2012  . Positive hepatitis C antibody test 09/2014   but negative confirmatory testing  . RBBB   . Uncontrolled type 2 diabetes mellitus with nephropathy ([place]) 06/07/2014   completed DSME 02/2016    Past Surgical History:  Procedure Laterality Date  . BREAST BIOPSY Right 01/2014   fibroadenoma  . COLONOSCOPY  07/2009  . COLONOSCOPY  11/2012   2 tubular adenomas, mild diverticulosis, pending genetic testing for Lynch syndrome [name]) rpt 2 yrs  . COLONOSCOPY  02/2015   TA, diverticulosis, rpt 2 yrs [name])  . COLONOSCOPY WITH PROPOFOL N/A 06/09/2017   TAx1, rpt 2 yrs [name], [name], MD)  . DEXA  12/2009   WNL  . DOBUTAMINE STRESS ECHO  12/2009   no evidence of ischemia  . HERNIA REPAIR  02/04/12  . i & d abdominal wound  02/19/12  . LEFT OOPHORECTOMY  2005  . PARTIAL COLECTOMY  about 2008   for colon cancer  . PARTIAL HYSTERECTOMY  1981   uterine cancer, R ovary remains  . Reexploration of abdominal wound and Allograft placemet  02/26/12  . Removal of infected mesh and abdominal wound vac placement  02/24/12  . sleep study  02/2014   OSA - AHI 55, nadir 81% [name])   Family History:  Family History  Problem Relation Age of Onset  . Colon cancer Mother 69  . Stomach cancer Mother        dx in her 63s?  . CAD Father        MI; deceased 73  . Mental illness Sister        anxiety/depression  . Thyroid disease Sister   . Breast cancer Maternal Grandmother        age at dx unknown  . Diabetes Brother   . Diabetes Brother   . Diabetes Other        aunts and uncles both sides  . Arthritis Other        strong fmhx  . Colon cancer Other 50       maternal half-sister; deceased   Family Psychiatric  History: bipolar disorder Social History:  Social History   Substance and Sexual Activity  Alcohol Use No  . Alcohol/week: 0.0 oz     Social History   Substance and Sexual Activity  Drug Use No    Social  History   Socioeconomic History  . Marital status: Single    Spouse name: Not on file  . Number of children: 0  . Years of education: Not on file  . Highest education level: Not on file  Occupational History  . Not on file  Social Needs  . Financial resource strain: Not on file  . Food insecurity:    Worry: Not on file    Inability: Not on file  . Transportation needs:    Medical: Not on file    Non-medical: Not on file  Tobacco Use  . Smoking status: Never Smoker  . Smokeless tobacco: Never Used  Substance and Sexual Activity  . Alcohol use: No  Alcohol/week: 0.0 oz  . Drug use: No  . Sexual activity: Never  Lifestyle  . Physical activity:    Days per week: Not on file    Minutes per session: Not on file  . Stress: Not on file  Relationships  . Social connections:    Talks on phone: Not on file    Gets together: Not on file    Attends religious service: Not on file    Active member of club or organization: Not on file    Attends meetings of clubs or organizations: Not on file    Relationship status: Not on file  Other Topics Concern  . Not on file  Social History Narrative   Lives with sister, no pets   Occupation: disabled, for bipolar and arthritis   Edu: GED   Activity: take walks   Diet: good water, vegetables daily   Religion: Catholic      Psych - [employer], Dr. Jimmye Norman (ph [phone])   Additional Social History:                         Sleep: Fair  Appetite:  Fair  Current Medications: Current Facility-Administered Medications  Medication Dose Route Frequency Provider Last Rate Last Dose  . alum & mag hydroxide-simeth (MAALOX/MYLANTA) 200-200-20 MG/5ML suspension 30 mL  30 mL Oral Q4H PRN Clapacs, John T, MD   30 mL at 11/23/17 1114  . bisacodyl (DULCOLAX) suppository 10 mg  10 mg Rectal Daily PRN Pucilowska, Jolanta B, MD      . divalproex (DEPAKOTE) DR tablet 500 mg  500 mg Oral Q12H Clapacs, Madie Reno, MD   500 mg at  11/23/17 0850  . docusate sodium (COLACE) capsule 200 mg  200 mg Oral BID Pucilowska, Jolanta B, MD   200 mg at 11/23/17 0849  . hydrOXYzine (ATARAX/VISTARIL) tablet 25 mg  25 mg Oral TID PRN Clapacs, Madie Reno, MD   25 mg at 11/22/17 2132  . lamoTRIgine (LAMICTAL) tablet 100 mg  100 mg Oral BID Pucilowska, Jolanta B, MD   100 mg at 11/23/17 0850  . levothyroxine (SYNTHROID, LEVOTHROID) tablet 50 mcg  50 mcg Oral QAC breakfast Pucilowska, Jolanta B, MD   50 mcg at 11/23/17 0849  . losartan (COZAAR) tablet 50 mg  50 mg Oral Daily Clapacs, Madie Reno, MD   50 mg at 11/23/17 0849  . magnesium citrate solution 1 Bottle  1 Bottle Oral Once Pucilowska, Jolanta B, MD      . magnesium hydroxide (MILK OF MAGNESIA) suspension 30 mL  30 mL Oral Daily PRN Clapacs, John T, MD      . metoprolol tartrate (LOPRESSOR) tablet 12.5 mg  12.5 mg Oral BID Clapacs, John T, MD   12.5 mg at 11/23/17 0849  . [START ON 12/10/2017] paliperidone (INVEGA SUSTENNA) injection 234 mg  234 mg Intramuscular Q28 days Pucilowska, Jolanta B, MD      . polyethylene glycol (MIRALAX / GLYCOLAX) packet 17 g  17 g Oral Daily Pucilowska, Jolanta B, MD   17 g at 11/23/17 0849  . QUEtiapine (SEROQUEL) tablet 300 mg  300 mg Oral QHS Pucilowska, Jolanta B, MD      . simvastatin (ZOCOR) tablet 20 mg  20 mg Oral q1800 Clapacs, Madie Reno, MD   20 mg at 11/22/17 1732    Lab Results: No results found for this or any previous visit (from the past 48 hour(s)).  Blood Alcohol  level:  Lab Results  Component Value Date   ETH <10 11/13/2017   ETH <10 67/07/4579    Metabolic Disorder Labs: Lab Results  Component Value Date   HGBA1C 5.9 (H) 11/14/2017   MPG 122.63 11/14/2017   MPG 114 04/14/2012   Lab Results  Component Value Date   PROLACTIN 33.6 (H) 12/04/2015   Lab Results  Component Value Date   CHOL 140 11/14/2017   TRIG 152 (H) 11/14/2017   HDL 50 11/14/2017   CHOLHDL 2.8 11/14/2017   VLDL 30 11/14/2017   LDLCALC 60 11/14/2017   LDLCALC 81  09/30/2017    Physical Findings: AIMS: Facial and Oral Movements Muscles of Facial Expression: None, normal Lips and Perioral Area: None, normal Jaw: None, normal Tongue: None, normal,Extremity Movements Upper (arms, wrists, hands, fingers): None, normal Lower (legs, knees, ankles, toes): None, normal, Trunk Movements Neck, shoulders, hips: None, normal, Overall Severity Severity of abnormal movements (highest score from questions above): None, normal Incapacitation due to abnormal movements: None, normal Patient's awareness of abnormal movements (rate only patient's report): No Awareness, Dental Status Current problems with teeth and/or dentures?: No Does patient usually wear dentures?: No  CIWA:    COWS:     Musculoskeletal: Strength & Muscle Tone: within normal limits Gait & Station: normal, broad based Patient leans: N/A  Psychiatric Specialty Exam: Physical Exam  Nursing note and vitals reviewed. Psychiatric: She has a normal mood and affect. Her speech is normal and behavior is normal. Judgment and thought content normal. Cognition and memory are normal.    Review of Systems  Gastrointestinal: Positive for constipation.  Musculoskeletal: Positive for joint pain.  Neurological: Negative.   Psychiatric/Behavioral: Negative.   All other systems reviewed and are negative.   Blood pressure 132/70, pulse 90, temperature 98 F (36.7 C), temperature source Oral, resp. rate 16, height 5' 2"  (1.575 m), weight 87.1 kg (192 lb), SpO2 97 %.Body mass index is 35.12 kg/m.  General Appearance: Casual  Eye Contact:  Good  Speech:  Clear and Coherent  Volume:  Normal  Mood:  Euthymic  Affect:  Appropriate  Thought Process:  Goal Directed and Descriptions of Associations: Intact  Orientation:  Full (Time, Place, and Person)  Thought Content:  WDL  Suicidal Thoughts:  No  Homicidal Thoughts:  No  Memory:  Immediate;   Fair Recent;   Fair Remote;   Fair  Judgement:  Impaired   Insight:  Shallow  Psychomotor Activity:  Normal  Concentration:  Concentration: Fair and Attention Span: Fair  Recall:  [employer] of Knowledge:  Fair  Language:  Fair  Akathisia:  No  Handed:  Right  AIMS (if indicated):     Assets:  Communication Skills Desire for Improvement Financial Resources/Insurance Housing Resilience Social Support  ADL's:  Intact  Cognition:  WNL  Sleep:  Number of Hours: 8     Treatment Plan Summary: Daily contact with patient to assess and evaluate symptoms and progress in treatment and Medication management   Gina [name] is a 67 year old female with a history of bipolar disorderwhoreturns to the hospital floridly manic with disturbing hallucinations and religious preoccupation.She is cool, collected and engaging. Discharge tomorrow.    #Mood/psychosis,improved -continue Depakote 500 mg BID,VPA level 67, free VPA 17 -decrease Seroquel to 300 mg nightly due to sedation -continue Invega sustenna injectionsevery 28 days, next dose on 5/9 -continue Lamictal 100 mg BID  #Insomnia, resolved with current treatment  #DM, stable -continue Metformin 500 mg BID -ADA  diet, BCG  #HTN, stable on current medications -continue Metorpolol 25 mg BID and Cozaar 50 mg daily  #Dyslipidemia, stable -continue Zocor 20 mg daily  #Hypothyidism -TSH slightly elevated at 5.8 -Synthroid 100 ug daily  #Arthritis, knee pain -discontinue Voltaren gel and Ultracet due to lack of effectiveness -PT consultis appreciated  #Constipation, resolved -continue bowel regimen   #Sleep apnea -continue CPAP   #Metabolic syndrome monitoring -Lipid panel shows slightly elevated TG, HgbA1C 5.8 -EKGreviewed, sinus brady of 59 with first degree AV block, QTc 461, asymptomatic  #Disposition -discharge to home with the sister -follow up with [facility] ACT team    Orson Slick, MD 11/23/2017, 11:46 AM

## 2017-11-23 NOTE — Telephone Encounter (Signed)
Returned pt's call.  States she is trying to stop smoking but it is hard on her nerves.  Wants to know if she can take an extra "nerve" pill a day.

## 2017-11-23 NOTE — Progress Notes (Signed)
Patient ID: Gina [name], female   DOB: [birth year], 67 y.o.   MRN: [id number] CSW spoke with [name], Pt's sister this morning who was voicing a lot of concern about Pt's hernia and knee. She also mentions concerns about her insurance and what she needs to do to have better coverage.  She was very anxious and accusatory that we were not listening or taking care of her needs.  CSW staffed case with MD and other RN.  CSW relayed this information to Pt's sister by phone in the evening.  Informed her that Knee had been evaluated by Physical therapy with no further recommendations for treatment.  CSW also informed her that her hernia was considered ongoing and that for ongoing health conditions like this her primary care doctor would need to recommend treatment steps,surgery etc would all be decided by them.  Explained inpatient hospital was only utilized for acute needs.  CSW recommended that she discuss best type of medicare for [name] with her pharmacy or with medicare representative at enrollment time. CSW also let her know to plan on Pt's discharge tomorrow.  ACTT team will need to transport. CSW will follow up with them for any questions.  [name] was much more pleasant in the afternoon phone call and thanked CSW for looking into concerns.  CSW called and texted ACTT team to notify of discharge.  Tried to return Sherry's call from [facility] ACTT team and Left voicemail. Will follow up on this tomorrow.  Dossie Arbour, LCSW

## 2017-11-23 NOTE — Progress Notes (Signed)
Recreation Therapy Notes  Date: 11/23/2017  Time: 9:30 am  Location: Craft Room  Behavioral response: Appropriate  Intervention Topic: Problem Solving  Discussion/Intervention:  Group content on today was focused on problem solving. The group described what problem solving is. Patients expressed how problems affect them and how they deal with problems. Individuals identified healthy ways to deal with problems. Patients explained what normally happens to them when they do not deal with problems. The group expressed reoccurring problems for them. The group participated in the intervention "Ways to Solve problems" where patients were given a chance to explore different ways to solve problems.  Clinical Observations/Feedback:  Patient came to group late due to unknown reasons. While in group she participated in the intervention and was social with peers and staff during group.   Abdulhadi Stopa LRT/CTRS          Merrissa Giacobbe 11/23/2017 12:00 PM

## 2017-11-23 NOTE — Telephone Encounter (Signed)
Copied from [place] [id number]. Topic: Quick Communication - See Telephone Encounter >> Nov 23, 2017  9:18 AM Robina Ade, Helene Kelp D wrote: CRM for notification. See Telephone encounter for: 11/23/17. Patient sister called and would like to talk to Dr. Danise Mina or his CMA about patient. He has been admitted to the hospital for a break down but is complaining of her hernia, but she says that they won't treat her there for it. She is very concern about this. Please call Ms. Marie back.

## 2017-11-23 NOTE — Plan of Care (Signed)
Pt is pleasant upon approach. Pt is currently on her CPAP no difficulties in breathing noted. Pt is medication compliant. Pt c/o knee pain prn medication given. Pt verbalize relief of pain. Pt remains on Q 15 mins safety round.  Problem: Spiritual Needs Goal: Ability to function at adequate level Outcome: Progressing   Problem: Education: Goal: Emotional status will improve Outcome: Progressing Goal: Mental status will improve Outcome: Progressing   Problem: Activity: Goal: Interest or engagement in activities will improve Outcome: Progressing Goal: Sleeping patterns will improve Outcome: Progressing   Problem: Health Behavior/Discharge Planning: Goal: Identification of resources available to assist in meeting health care needs will improve Outcome: Progressing Goal: Compliance with treatment plan for underlying cause of condition will improve Outcome: Progressing   Problem: Education: Goal: Utilization of techniques to improve thought processes will improve Outcome: Progressing   Problem: Coping: Goal: Coping ability will improve Outcome: Progressing Goal: Will verbalize feelings Outcome: Progressing   Problem: Health Behavior/Discharge Planning: Goal: Compliance with therapeutic regimen will improve Outcome: Progressing   Problem: Role Relationship: Goal: Will demonstrate positive changes in social behaviors and relationships Outcome: Progressing   Problem: Self-Concept: Goal: Will verbalize positive feelings about self Outcome: Progressing Goal: Level of anxiety will decrease Outcome: Progressing   Problem: Safety: Goal: Ability to remain free from injury will improve Outcome: Progressing

## 2017-11-23 NOTE — Progress Notes (Signed)
D: patient found in the dayroom with her prayer book. Pt stated she slept well last night and denies any si/hi/avh. Pt compliant with medication administration. Pt stated her pain was a 10/10 in her left knee. A: patient given medications per protocol and standing orders. Support and encouragement given. q15 minute checks continued. R: patient safe on the unit. Patient contracts for safety. Will continue to monitor.

## 2017-11-23 NOTE — Discharge Summary (Addendum)
Physician Discharge Summary Note  Patient:  Gina [name] is an 67 y.o., female MRN:  [id number] DOB:  1951/07/10 Patient phone:  [phone] (home)  Patient address:   [place] [address],  Total Time spent with patient: 20 minutes plus 20 min on care coordination and documentation  Date of Admission:  11/13/2017 Date of Discharge: 11/24/2017  Reason for Admission:  Psychotic break.  History of Present Illness:   Identifying data. [name] is a 67 year old female with a history of bipolar illness.  Chief complaint. "I could not sleep."  History of present illness. Information was obtained from the patient and the chart. The patient came to the ER complaining of insomnia and racing thoughts of several days duration. She has been praying a lot to fight satan that is coming at her with his pitchfrok. She denies auditory hallucinations at present but did experience voices in the past. She also has been giving people "grace" through their eyes. She admits, that she has not been entirely complaint with medications prescribed by her psychiatrist from ACT team and missed several doses recently. She is unsbale to name any of her medications. She did not remember she is taking [place] sustenna shots. She denies depression, anxiety, suicidal or homicidal ideation. She did not spend any money. She does not use drugs or alcohol.   Past psychiatric history. Long history of bipolar illness with multiple psychiatric hospitalizations and medications trials. She denies suicide attempts but reports that at times she came close "when satan took his pitchfork to her". 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  Family psychiatric history. Her sister does not read or write.  Social history. She lives with her sister and is disabled from mental illness. Her son lives out of state. She use to work in a [place] but her father decided it was too  stressful.  Principal Problem: Bipolar I disorder, most recent episode mixed, severe with psychotic features [facility]) Discharge Diagnoses: Patient Active Problem List   Diagnosis Date Noted  . Bipolar I disorder, most recent episode mixed, severe with psychotic features ([place]) [F31.64] 11/13/2017    Priority: High  . Bipolar I disorder, current or most recent episode manic, with psychotic features ([place]) [F31.2] 11/04/2017    Priority: High  . Gallbladder polyp [K82.4] 10/21/2017  . Left shoulder pain [M25.512] 10/21/2017  . Benign neoplasm of ascending colon [D12.2]   . Bipolar affective disorder, current episode hypomanic ([place]) [F31.0] 04/08/2017  . Low back pain radiating to left lower extremity [M54.5, M79.605] 02/26/2017  . Acquired hypothyroidism [E03.9] 09/10/2016  . Thrombocytopenia ([place]) [D69.6] 06/15/2016  . Medicare annual wellness visit, subsequent [Z00.00] 06/04/2016  . Advanced care planning/counseling discussion [Z71.89] 06/04/2016  . External hemorrhoid [K64.4] 04/02/2016  . Slow transit constipation [K59.01] 01/04/2016  . Bipolar I disorder, most recent episode (or current) manic ([place]) [F31.10] 12/03/2015  . NAFLD (nonalcoholic fatty liver disease) [K76.0] 03/28/2015  . Gallbladder calculus without cholecystitis [K80.20] 03/28/2015  . Gout [M10.9] 03/27/2015  . HCV antibody positive [R76.8] 03/27/2015  . History of bladder cancer [Z85.51] 03/27/2015  . History of bundle branch block [Z86.79] 03/27/2015  . Genetic testing [Z13.79] 02/28/2015  . Loss of memory [R41.3] 09/04/2014  . Controlled type 2 diabetes mellitus with diabetic nephropathy ([place]) [E11.21] 06/07/2014  . Hyperlipidemia [E78.5]   . Anemia in CKD (chronic kidney disease) [N18.9, D63.1] 01/26/2013  . Personal history of colonic adenomas and colon cancer [Z86.010] 11/11/2012  . Osteoarthritis of  knee [M17.10] 10/02/2012  . OSA on CPAP [G47.33, Z99.89]   . CKD (chronic kidney disease) stage 3, GFR 30-59  ml/min (HCC) [N18.3]   . Severe obesity (BMI 35.0-39.9) with comorbidity ([place]) [E66.01]   . HTN (hypertension), benign [I10] 04/07/2012  . Hx of colon cancer, stage I [Z85.038] 08/05/2011   Past Medical History:  Past Medical History:  Diagnosis Date  . Anemia in chronic kidney disease   . Bipolar depression [facility])    sees psychiatrist - psych admission 03/2015  . Bladder cancer ([place]) 1990's  . Blood transfusion without reported diagnosis 2009  . Cataract    left  . CKD (chronic kidney disease) stage 3, GFR 30-59 ml/min (HCC)   . Colon cancer ([place]) 1990's  . DJD (degenerative joint disease), lumbar    chronic lower back pain  . Enterocutaneous fistula 04/07/2012   completed PT 06/2012 ([facility])  . Family history of breast cancer   . Family history of colon cancer   . Family history of stomach cancer   . GERD (gastroesophageal reflux disease)   . History of bladder cancer 1997  . History of colon cancer    s/p surgery  . History of uterine cancer    s/p hysterectomy  . Hyperlipidemia   . Hypertension   . IDA (iron deficiency anemia) 01/2013   thought due to h/o polyps  . Obesity (BMI 30-39.9)   . OSA on CPAP    6cm H2O  . Personal history of colonic adenomas and colon cancer 11/11/2012  . Positive hepatitis C antibody test 09/2014   but negative confirmatory testing  . RBBB   . Uncontrolled type 2 diabetes mellitus with nephropathy ([place]) 06/07/2014   completed DSME 02/2016    Past Surgical History:  Procedure Laterality Date  . BREAST BIOPSY Right 01/2014   fibroadenoma  . COLONOSCOPY  07/2009  . COLONOSCOPY  11/2012   2 tubular adenomas, mild diverticulosis, pending genetic testing for Lynch syndrome [name]) rpt 2 yrs  . COLONOSCOPY  02/2015   TA, diverticulosis, rpt 2 yrs [name])  . COLONOSCOPY WITH PROPOFOL N/A 06/09/2017   TAx1, rpt 2 yrs [name], [name], MD)  . DEXA  12/2009   WNL  . DOBUTAMINE STRESS ECHO  12/2009   no evidence of ischemia  . HERNIA REPAIR   02/04/12  . i & d abdominal wound  02/19/12  . LEFT OOPHORECTOMY  2005  . PARTIAL COLECTOMY  about 2008   for colon cancer  . PARTIAL HYSTERECTOMY  1981   uterine cancer, R ovary remains  . Reexploration of abdominal wound and Allograft placemet  02/26/12  . Removal of infected mesh and abdominal wound vac placement  02/24/12  . sleep study  02/2014   OSA - AHI 55, nadir 81% [name])   Family History:  Family History  Problem Relation Age of Onset  . Colon cancer Mother 60  . Stomach cancer Mother        dx in her 65s?  . CAD Father        MI; deceased 22  . Mental illness Sister        anxiety/depression  . Thyroid disease Sister   . Breast cancer Maternal Grandmother        age at dx unknown  . Diabetes Brother   . Diabetes Brother   . Diabetes Other        aunts and uncles both sides  . Arthritis Other  strong fmhx  . Colon cancer Other 74       maternal half-sister; deceased   Social History:  Social History   Substance and Sexual Activity  Alcohol Use No  . Alcohol/week: 0.0 oz     Social History   Substance and Sexual Activity  Drug Use No    Social History   Socioeconomic History  . Marital status: Single    Spouse name: Not on file  . Number of children: 0  . Years of education: Not on file  . Highest education level: Not on file  Occupational History  . Not on file  Social Needs  . Financial resource strain: Not on file  . Food insecurity:    Worry: Not on file    Inability: Not on file  . Transportation needs:    Medical: Not on file    Non-medical: Not on file  Tobacco Use  . Smoking status: Never Smoker  . Smokeless tobacco: Never Used  Substance and Sexual Activity  . Alcohol use: No    Alcohol/week: 0.0 oz  . Drug use: No  . Sexual activity: Never  Lifestyle  . Physical activity:    Days per week: Not on file    Minutes per session: Not on file  . Stress: Not on file  Relationships  . Social connections:    Talks on phone:  Not on file    Gets together: Not on file    Attends religious service: Not on file    Active member of club or organization: Not on file    Attends meetings of clubs or organizations: Not on file    Relationship status: Not on file  Other Topics Concern  . Not on file  Social History Narrative   Lives with sister, no pets   Occupation: disabled, for bipolar and arthritis   Edu: GED   Activity: take walks   Diet: good water, vegetables daily   Religion: [place], Dr. Jimmye Norman (ph [phone])    Hospital Course:    [name] is a 67 year old female with a history of bipolar disorderwhoreturns to the hospital shortly after discharge floridly manic with disturbing hallucinations and religious preoccupation. She is no longer psychotic and tolerates medications well.   #Mood/psychosis,improved -continue Depakote 500 mg BID,VPA level 67, free VPA 17 -continue seroquel 300 mg nightly  -continue [place]ve[place] sustenna injectionsevery 28 days, next dose on 5/9 -continueLamictal 100 mg BID  #Insomnia, resolved with current treatment  #DM, stable -continue Metformin 500 mg BID -ADA diet, BCG  #HTN, stable on current medications -continue Metorpolol 25 mg BID and Cozaar 50 mg daily  #Dyslipidemia, stable -continue Zocor 20 mg daily  #Hypothyidism -TSH slightly elevated at 5.8 -Synthroid 100 ug daily  #Arthritis, knee pain -discontinue Voltaren gel and Ultracet due to lack of effectiveness -PT consultis appreciated, no home PT recommended  #Constipation, resolved -continue bowel regimen  #Sleep apnea -continue CPAP   #Metabolic syndrome monitoring -Lipid panel shows slightly elevated TG, HgbA1C 5.8 -EKGreviewed, sinus brady of 59 with first degree AV block, QTc 461, asymptomatic  #Disposition -discharge to home with the sister -follow up with [facility] ACT team    Physical Findings: AIMS: Facial and Oral  Movements Muscles of Facial Expression: None, normal Lips and Perioral Area: None, normal Jaw: None, normal Tongue: None, normal,Extremity Movements Upper (arms, wrists, hands, fingers): None, normal Lower (legs, knees, ankles, toes): None, normal, Trunk Movements  Neck, shoulders, hips: None, normal, Overall Severity Severity of abnormal movements (highest score from questions above): None, normal Incapacitation due to abnormal movements: None, normal Patient's awareness of abnormal movements (rate only patient's report): No Awareness, Dental Status Current problems with teeth and/or dentures?: No Does patient usually wear dentures?: No  CIWA:    COWS:     Musculoskeletal: Strength & Muscle Tone: within normal limits Gait & Station: broad based Patient leans: N/A  Psychiatric Specialty Exam: Physical Exam  Nursing note and vitals reviewed. Psychiatric: She has a normal mood and affect. Her speech is normal and behavior is normal. Thought content normal. Cognition and memory are normal.    Review of Systems  Gastrointestinal: Positive for constipation.  Musculoskeletal: Positive for joint pain.  Neurological: Negative.   Psychiatric/Behavioral: Positive for memory loss.  All other systems reviewed and are negative.   Blood pressure (!) 157/88, pulse 93, temperature 98 F (36.7 C), temperature source Oral, resp. rate 16, height 5\' 2"  (1.575 m), weight 87.1 kg (192 lb), SpO2 97 %.Body mass index is 35.12 kg/m.  General Appearance: Casual  Eye Contact:  Good  Speech:  Clear and Coherent  Volume:  Normal  Mood:  Euthymic  Affect:  Appropriate  Thought Process:  Goal Directed and Descriptions of Associations: Intact  Orientation:  Full (Time, Place, and Person)  Thought Content:  WDL  Suicidal Thoughts:  No  Homicidal Thoughts:  No  Memory:  Immediate;   Fair Recent;   Fair Remote;   Fair  Judgement:  Impaired  Insight:  Shallow  Psychomotor Activity:  Normal   Concentration:  Concentration: Fair and Attention Span: Fair  Recall:  [employer] of Knowledge:  Fair  Language:  Fair  Akathisia:  No  Handed:  Right  AIMS (if indicated):     Assets:  Communication Skills Desire for Improvement Financial Resources/Insurance Housing Physical Health Resilience Social Support  ADL's:  Intact  Cognition:  WNL  Sleep:  Number of Hours: 8.3     Have you used any form of tobacco in the last 30 days? (Cigarettes, Smokeless Tobacco, Cigars, and/or Pipes): No  Has this patient used any form of tobacco in the last 30 days? (Cigarettes, Smokeless Tobacco, Cigars, and/or Pipes) Yes, No  Blood Alcohol level:  Lab Results  Component Value Date   ETH <10 11/13/2017   ETH <10 02/58/5277    Metabolic Disorder Labs:  Lab Results  Component Value Date   HGBA1C 5.9 (H) 11/14/2017   MPG 122.63 11/14/2017   MPG 114 04/14/2012   Lab Results  Component Value Date   PROLACTIN 33.6 (H) 12/04/2015   Lab Results  Component Value Date   CHOL 140 11/14/2017   TRIG 152 (H) 11/14/2017   HDL 50 11/14/2017   CHOLHDL 2.8 11/14/2017   VLDL 30 11/14/2017   LDLCALC 60 11/14/2017   LDLCALC 81 09/30/2017    See Psychiatric Specialty Exam and Suicide Risk Assessment completed by Attending Physician prior to discharge.  Discharge destination:  Home  Is patient on multiple antipsychotic therapies at discharge:  Yes,   Do you recommend tapering to monotherapy for antipsychotics?  No   Has Patient had three or more failed trials of antipsychotic monotherapy by history:  Yes,   Antipsychotic medications that previously failed include:   1.  zyprexa., 2.  seroquerl. and 3.  risperdal.  Recommended Plan for Multiple Antipsychotic Therapies: Additional reason(s) for multiple antispychotic treatment:  inadequate response to a single agent  Discharge Instructions    Diet - low sodium heart healthy   Complete by:  As directed    Increase activity slowly   Complete  by:  As directed      Allergies as of 11/24/2017      Reactions   Risperidone And Related Other (See Comments)   Reaction:  Altered mental status    Penicillins Other (See Comments)   Pt states that this med knocks her out.  Has patient had a PCN reaction causing immediate rash, facial/tongue/throat swelling, SOB or lightheadedness with hypotension Unsure  Has patient had a PCN reaction causing severe rash involving mucus membranes or skin necrosis Unsure  Has patient had a PCN reaction that required hospitalization Unsure  Has patient had a PCN reaction occurring within the last 10 years Unsure  If all of the above answers are "NO", then may proceed with Cephalosporin use.   Ivp Dye [iodinated Diagnostic Agents] Rash   Tetanus Toxoids Rash      Zetia [ezetimibe] Rash      Medication List    STOP taking these medications   etodolac 200 MG capsule Commonly known as:  LODINE   nystatin powder Commonly known as:  MYCOSTATIN/NYSTOP     TAKE these medications     Indication  ACCU-CHEK AVIVA PLUS test strip Generic drug:  glucose blood 1 each by Other route daily. E11.21  Indication:  diabetes care   bisacodyl 10 MG suppository Commonly known as:  DULCOLAX Place 1 suppository (10 mg total) rectally daily as needed for moderate constipation.  Indication:  Constipation   divalproex 500 MG DR tablet Commonly known as:  DEPAKOTE Take 1 tablet (500 mg total) by mouth 2 (two) times daily with a meal.  Indication:  Manic Phase of Manic-Depression   docusate sodium 100 MG capsule Commonly known as:  COLACE Take 2 capsules (200 mg total) by mouth 2 (two) times daily. What changed:  how much to take  Indication:  Constipation   INVEGA SUSTENNA 234 MG/1.5ML Susp injection Generic drug:  paliperidone Inject 234 mg into the muscle every 30 (thirty) days.  Indication:  Schizophrenia   lamoTRIgine 25 MG tablet Commonly known as:  LAMICTAL Take 2 tablets (50 mg total) by mouth 2  (two) times daily. What changed:    how much to take  when to take this  Indication:  Manic-Depression   levothyroxine 50 MCG tablet Commonly known as:  SYNTHROID, LEVOTHROID Take 1 tablet (50 mcg total) by mouth daily before breakfast.  Indication:  Underactive Thyroid   losartan 50 MG tablet Commonly known as:  COZAAR Take 1 tablet (50 mg total) by mouth daily.  Indication:  High Blood Pressure Disorder   MELATONIN PO Take 1 tablet by mouth at bedtime.  Indication:  insomnia   metFORMIN 500 MG tablet Commonly known as:  GLUCOPHAGE Take 1 tablet (500 mg total) by mouth daily with breakfast.  Indication:  Type 2 Diabetes   metoprolol tartrate 25 MG tablet Commonly known as:  LOPRESSOR Take 0.5 tablets (12.5 mg total) by mouth 2 (two) times daily. What changed:  See the new instructions.  Indication:  High Blood Pressure Disorder   polyethylene glycol powder powder Commonly known as:  GLYCOLAX/MIRALAX TAKE 17 GRAMS BY MOUTH TWICE DAILY AS NEEDED FOR MODERATE CONSTIPATION.  Indication:  Constipation   QUEtiapine 300 MG tablet Commonly known as:  SEROQUEL Take 1 tablet (300 mg total) by mouth at bedtime.  Indication:  Manic Phase of Manic-Depression  senna 8.6 MG Tabs tablet Commonly known as:  SENOKOT Take 1 tablet (8.6 mg total) by mouth at bedtime as needed for mild constipation.  Indication:  Constipation   simvastatin 20 MG tablet Commonly known as:  ZOCOR Take 1 tablet (20 mg total) by mouth at bedtime.  Indication:  High Amount of Fats in the Blood        Follow-up recommendations:  Activity:  as tolerated Diet:  low sodium heart healthy ADA diet Other:  keep follow up appointments  Comments:    Signed: Orson Slick, MD 11/24/2017, 9:52 AM

## 2017-11-23 NOTE — [facility]HH Suicide Risk Assessment (Addendum)
[facility] Discharge Suicide Risk Assessment   Principal Problem: Bipolar I disorder, most recent episode mixed, severe with psychotic features [facility]) Discharge Diagnoses:  Patient Active Problem List   Diagnosis Date Noted  . Bipolar I disorder, most recent episode mixed, severe with psychotic features ([place]) [F31.64] 11/13/2017    Priority: High  . Bipolar I disorder, current or most recent episode manic, with psychotic features ([place]) [F31.2] 11/04/2017    Priority: High  . Gallbladder polyp [K82.4] 10/21/2017  . Left shoulder pain [M25.512] 10/21/2017  . Benign neoplasm of ascending colon [D12.2]   . Bipolar affective disorder, current episode hypomanic ([place]) [F31.0] 04/08/2017  . Low back pain radiating to left lower extremity [M54.5, M79.605] 02/26/2017  . Acquired hypothyroidism [E03.9] 09/10/2016  . Thrombocytopenia ([place]) [D69.6] 06/15/2016  . Medicare annual wellness visit, subsequent [Z00.00] 06/04/2016  . Advanced care planning/counseling discussion [Z71.89] 06/04/2016  . External hemorrhoid [K64.4] 04/02/2016  . Slow transit constipation [K59.01] 01/04/2016  . Bipolar I disorder, most recent episode (or current) manic ([place]) [F31.10] 12/03/2015  . NAFLD (nonalcoholic fatty liver disease) [K76.0] 03/28/2015  . Gallbladder calculus without cholecystitis [K80.20] 03/28/2015  . Gout [M10.9] 03/27/2015  . HCV antibody positive [R76.8] 03/27/2015  . History of bladder cancer [Z85.51] 03/27/2015  . History of bundle branch block [Z86.79] 03/27/2015  . Genetic testing [Z13.79] 02/28/2015  . Loss of memory [R41.3] 09/04/2014  . Controlled type 2 diabetes mellitus with diabetic nephropathy ([place]) [E11.21] 06/07/2014  . Hyperlipidemia [E78.5]   . Anemia in CKD (chronic kidney disease) [N18.9, D63.1] 01/26/2013  . Personal history of colonic adenomas and colon cancer [Z86.010] 11/11/2012  . Osteoarthritis of knee [M17.10] 10/02/2012  . OSA on CPAP [G47.33, Z99.89]   . CKD (chronic kidney  disease) stage 3, GFR 30-59 ml/min (HCC) [N18.3]   . Severe obesity (BMI 35.0-39.9) with comorbidity ([place]) [E66.01]   . HTN (hypertension), benign [I10] 04/07/2012  . Hx of colon cancer, stage I [Z85.038] 08/05/2011    Total Time spent with patient: 20 minutes plus 20 min on care coordination and documantation  Musculoskeletal: Strength & Muscle Tone: within normal limits Gait & Station: broad based Patient leans: N/A  Psychiatric Specialty Exam: Review of Systems  Gastrointestinal: Positive for constipation.  Musculoskeletal: Positive for joint pain.  Neurological: Negative.   Psychiatric/Behavioral: Positive for memory loss.  All other systems reviewed and are negative.   Blood pressure 132/70, pulse 90, temperature 98 F (36.7 C), temperature source Oral, resp. rate 16, height 5\' 2"  (1.575 m), weight 87.1 kg (192 lb), SpO2 97 %.Body mass index is 35.12 kg/m.  General Appearance: Casual  Eye Contact::  Good  Speech:  Clear and Coherent409  Volume:  Normal  Mood:  Euthymic  Affect:  Appropriate  Thought Process:  Goal Directed and Descriptions of Associations: Intact  Orientation:  Full (Time, Place, and Person)  Thought Content:  WDL  Suicidal Thoughts:  No  Homicidal Thoughts:  No  Memory:  Immediate;   Fair Recent;   Fair Remote;   Fair  Judgement:  Impaired  Insight:  Shallow  Psychomotor Activity:  Normal  Concentration:  Fair  Recall:  [place]  Language: Fair  Akathisia:  No  Handed:  Right  AIMS (if indicated):     Assets:  Communication Skills Desire for Improvement Financial Resources/Insurance Housing Resilience Social Support  Sleep:  Number of Hours: 8  Cognition: WNL  ADL's:  Intact   Mental Status Per Nursing Assessment::   On  Admission:     Demographic Factors:  Age 3 or older, Divorced or widowed and Caucasian  Loss Factors: Decline in physical health and Financial problems/change in socioeconomic  status  Historical Factors: Family history of mental illness or substance abuse and Impulsivity  Risk Reduction Factors:   Sense of responsibility to family, Living with another person, especially a relative, Positive social support and Positive therapeutic relationship  Continued Clinical Symptoms:  Bipolar Disorder:   Mixed State Previous Psychiatric Diagnoses and Treatments Medical Diagnoses and Treatments/Surgeries  Cognitive Features That Contribute To Risk:  None    Suicide Risk:  Minimal: No identifiable suicidal ideation.  Patients presenting with no risk factors but with morbid ruminations; may be classified as minimal risk based on the severity of the depressive symptoms    Plan Of Care/Follow-up recommendations:  Activity:  as tolerated Diet:  low sodium heart healthy ADA diet Other:  keep follow up appointments  Orson Slick, MD 11/23/2017, 12:05 PM

## 2017-11-24 ENCOUNTER — Telehealth: Payer: Self-pay | Admitting: Family Medicine

## 2017-11-24 ENCOUNTER — Encounter: Payer: Self-pay | Admitting: *Deleted

## 2017-11-24 ENCOUNTER — Ambulatory Visit: Payer: Self-pay | Admitting: *Deleted

## 2017-11-24 MED ORDER — LAMOTRIGINE 25 MG PO TABS
50.0000 mg | ORAL_TABLET | Freq: Two times a day (BID) | ORAL | Status: DC
  Administered 2017-11-24: 50 mg via ORAL
  Filled 2017-11-24: qty 2

## 2017-11-24 MED ORDER — LAMOTRIGINE 25 MG PO TABS: 50.0000 mg | ORAL_TABLET | Freq: Two times a day (BID) | ORAL | 1 refills | Status: DC

## 2017-11-24 NOTE — Progress Notes (Signed)
Recreation Therapy Notes  Date: 11/24/2017  Time: 9:30 am  Location: Craft Room  Behavioral response: Appropriate  Intervention Topic: Self-esteem  Discussion/Intervention:  Group content today was focused on self-esteem. Patient defined self-esteem and where it comes form. The group described reasons self-esteem is important. Individuals stated things that impact self-esteem and positive ways to improve self-esteem. The group participated in the intervention "Collage of Me" where patients were able to create a collage of positive things that makes them who they are. Clinical Observations/Feedback:  Patient came to group and was focused on what her peers and staff had to say about self-esteem. She participated in the intervention and was social with peers and staff during group.  [name] LRT/CTRS         Gina [name] 11/24/2017 11:00 AM

## 2017-11-24 NOTE — Plan of Care (Signed)
Patient slept for Estimated Hours of 8.30; Precautionary checks every 15 minutes for safety maintained, room free of safety hazards, patient sustains no injury or falls during this shift.  Problem: Spiritual Needs Goal: Ability to function at adequate level Outcome: Progressing   Problem: Education: Goal: Emotional status will improve Outcome: Progressing Goal: Mental status will improve Outcome: Progressing   Problem: Activity: Goal: Interest or engagement in activities will improve Outcome: Progressing Goal: Sleeping patterns will improve Outcome: Progressing   Problem: Health Behavior/Discharge Planning: Goal: Identification of resources available to assist in meeting health care needs will improve Outcome: Progressing Goal: Compliance with treatment plan for underlying cause of condition will improve Outcome: Progressing   Problem: Education: Goal: Utilization of techniques to improve thought processes will improve Outcome: Progressing   Problem: Coping: Goal: Coping ability will improve Outcome: Progressing Goal: Will verbalize feelings Outcome: Progressing   Problem: Health Behavior/Discharge Planning: Goal: Compliance with therapeutic regimen will improve Outcome: Progressing   Problem: Role Relationship: Goal: Will demonstrate positive changes in social behaviors and relationships Outcome: Progressing   Problem: Self-Concept: Goal: Will verbalize positive feelings about self Outcome: Progressing Goal: Level of anxiety will decrease Outcome: Progressing   Problem: Safety: Goal: Ability to remain free from injury will improve Outcome: Progressing

## 2017-11-24 NOTE — Telephone Encounter (Signed)
This was placed in wrong chart. See Marie's chart.

## 2017-11-24 NOTE — Telephone Encounter (Signed)
Copied from [place] [id number]. Topic: Inquiry >> Nov 24, 2017  3:54 PM Pricilla Handler wrote: Reason for CRM: Patient called requesting a refill of Clorys medication for Gout Attacks. Patient wants this medication sent to her home by home delivery. Please call this patient at [phone].       Thank You!!!

## 2017-11-24 NOTE — Progress Notes (Signed)
Patient ID: Gina [name], female   DOB: 03-23-1951, 67 y.o.   MRN: [id number] Pleasant on approach, mood and affect slightly sad and "blue"; requested for early medication and CPAP set up. Denied SI/HI/AVH

## 2017-11-24 NOTE — [facility] Group Notes (Signed)
CSW Group Therapy Note  11/24/2017  Time:  0900  Type of Therapy and Topic: Group Therapy: Goals Group: SMART Goals    Participation Level:  Minimal    Description of Group:   The purpose of a daily goals group is to assist and guide patients in setting recovery/wellness-related goals. The objective is to set goals as they relate to the crisis in which they were admitted. Patients will be using SMART goal modalities to set measurable goals. Characteristics of realistic goals will be discussed and patients will be assisted in setting and processing how one will reach their goal. Facilitator will also assist patients in applying interventions and coping skills learned in psycho-education groups to the SMART goal and process how one will achieve defined goal.    Therapeutic Goals:  -Patients will develop and document one goal related to or their crisis in which brought them into treatment.  -Patients will be guided by LCSW using SMART goal setting modality in how to set a measurable, attainable, realistic and time sensitive goal.  -Patients will process barriers in reaching goal.  -Patients will process interventions in how to overcome and successful in reaching goal.    Patient's Goal:   Pt attended group but was not able to participate as directed by CSW. Pt would answer questions but did not utilize the workbook provided by CSW. Pt reported her goal for the day is to, "work on my knee pain."  CSW assisted pt in reworking her goal into the SMART guidelines to form the following goal, "to decrease knee pain by exercising for at least three minutes by the end of the day."  Therapeutic Modalities:  Motivational Interviewing  Cognitive Behavioral Therapy  Crisis Intervention Model  SMART goals setting  Alden Hipp, MSW, LCSW Clinical Social Worker 11/24/2017 9:50 AM

## 2017-11-24 NOTE — Progress Notes (Signed)
Received Gina [name] this AM after breakfast, she was compliant with her medications. She denied all of the psychiatric symptoms and feels safe to be discharge home today via the ACT team. She received and reviewed her AVS,her questions were answered. Her medications was returned and personal items. She received her prescriptions. She was discharged without  incident  with a representative from the ACT.

## 2017-11-24 NOTE — Telephone Encounter (Signed)
FYI for Dr Darnell Level that [name] RN with [facility] called was having problems scheduling pt for 15' appt and I advised pt should probably be 32' and [name] said computer allowed him to schedule pt for 11/25/17 at 12 noon for 30' appt for foot pain.

## 2017-11-24 NOTE — Telephone Encounter (Signed)
  This encounter was created in error - please disregard.duplicate triage

## 2017-11-24 NOTE — Progress Notes (Signed)
Recreation Therapy Notes  INPATIENT RECREATION TR PLAN  Patient Details Name: Gina [name] MRN: [id number] DOB: 04-16-1951 Today's Date: 11/24/2017  Rec Therapy Plan Is patient appropriate for Therapeutic Recreation?: Yes Treatment times per week: at least 3 Estimated Length of Stay: 5-7 days TR Treatment/Interventions: Group participation (Comment)  Discharge Criteria Pt will be discharged from therapy if:: Discharged Treatment plan/goals/alternatives discussed and agreed upon by:: Patient/family  Discharge Summary Short term goals set: Patient will focus on task/topic with 2 prompts from staff within 5 recreation therapy group sessions Short term goals met: Complete Progress toward goals comments: Groups attended Which groups?: Coping skills, Leisure education, Self-esteem, Other (Comment)(Values, Problem Solving) Reason goals not met: N/A Therapeutic equipment acquired: N/A Reason patient discharged from therapy: Discharge from hospital Pt/family agrees with progress & goals achieved: Yes Date patient discharged from therapy: 11/24/17   Bilan Tedesco 11/24/2017, 1:27 PM

## 2017-11-24 NOTE — Telephone Encounter (Signed)
Patient has  Pain and  Swelling   Of  Both  Feet  Especially the  Toes  On her L foot. She  Has a  History of gout  And  She  Had  Some  Medications  That she  Had gotten  From a previous   Provider  For  Gout  But  She  Reports  They  Expired  Today  And  They were prescribed in past. Appointment made  For tomorrow with Dr Danise Mina    Reason for Disposition . [1] MODERATE pain (e.g., interferes with normal activities, limping) AND [2] present > 3 days  Answer Assessment - Initial Assessment Questions 1. ONSET: "When did the pain start?"       2  Days  Ago   2. LOCATION: "Where is the pain located?"       Both  Feet   Worse  On toes  l  Foot   3. PAIN: "How bad is the pain?"    (Scale 1-10; or mild, moderate, severe)   -  MILD (1-3): doesn't interfere with normal activities    -  MODERATE (4-7): interferes with normal activities (e.g., work or school) or awakens from sleep, limping    -  SEVERE (8-10): excruciating pain, unable to do any normal activities, unable to walk     10  4. WORK OR EXERCISE: "Has there been any recent work or exercise that involved this part of the body?"        NO 5. CAUSE: "What do you think is causing the foot pain?"       Maybe Gout  6. OTHER SYMPTOMS: "Do you have any other symptoms?" (e.g., leg pain, rash, fever, numbness)     Both  Feet  Swollen   7. PREGNANCY: "Is there any chance you are pregnant?" "When was your last menstrual period?"     n/a  Protocols used: FOOT PAIN-A-AH

## 2017-11-25 ENCOUNTER — Encounter: Payer: Self-pay | Admitting: Family Medicine

## 2017-11-25 ENCOUNTER — Ambulatory Visit (INDEPENDENT_AMBULATORY_CARE_PROVIDER_SITE_OTHER): Payer: Medicare Other | Admitting: Family Medicine

## 2017-11-25 VITALS — BP 136/84 | HR 76 | Temp 98.4°F | Ht 61.0 in | Wt 201.5 lb

## 2017-11-25 DIAGNOSIS — M1A0790 Idiopathic chronic gout, unspecified ankle and foot, without tophus (tophi): Secondary | ICD-10-CM | POA: Diagnosis not present

## 2017-11-25 DIAGNOSIS — M79671 Pain in right foot: Secondary | ICD-10-CM

## 2017-11-25 DIAGNOSIS — N184 Chronic kidney disease, stage 4 (severe): Secondary | ICD-10-CM

## 2017-11-25 DIAGNOSIS — F3110 Bipolar disorder, current episode manic without psychotic features, unspecified: Secondary | ICD-10-CM

## 2017-11-25 DIAGNOSIS — M170 Bilateral primary osteoarthritis of knee: Secondary | ICD-10-CM | POA: Diagnosis not present

## 2017-11-25 DIAGNOSIS — E1121 Type 2 diabetes mellitus with diabetic nephropathy: Secondary | ICD-10-CM

## 2017-11-25 DIAGNOSIS — I10 Essential (primary) hypertension: Secondary | ICD-10-CM

## 2017-11-25 DIAGNOSIS — M79672 Pain in left foot: Secondary | ICD-10-CM | POA: Diagnosis not present

## 2017-11-25 MED ORDER — TRAMADOL HCL 50 MG PO TABS: 25.0000 mg | ORAL_TABLET | Freq: Two times a day (BID) | ORAL | 0 refills | Status: DC | PRN

## 2017-11-25 NOTE — Assessment & Plan Note (Addendum)
Progressively deteriorated. Consider nephrology referral if remaining stage 4. Will need microalb next visit, consider UA

## 2017-11-25 NOTE — Assessment & Plan Note (Addendum)
Anticipate this is more related to pedal edema. Encouraged conservative management treatment - leg elevation, good water hydration. Reassess at f/u visit 1 mo.

## 2017-11-25 NOTE — Progress Notes (Signed)
BP 136/84 (BP Location: Left Arm, Patient Position: Sitting, Cuff Size: Normal)   Pulse 76   Temp 98.4 F (36.9 C) (Oral)   Ht 5\' 1"  (1.549 m)   Wt 201 lb 8 oz (91.4 kg)   SpO2 96%   BMI 38.07 kg/m    CC: bilateral foot pain, L knee pain Subjective:    Patient ID: Gina [name], female    DOB: 1951/04/28, 67 y.o.   MRN: [id number]  HPI: Gina [name] is a 67 y.o. female presenting on 11/25/2017 for Foot Pain (C/o bilateral foot pain and swelling. Has off and on since 11/13/17. Radiates into the toes.  Thinks it may be gout. Took 1 Colcrys but stopped thinking it had expired. ) and Joint Swelling (Left knee swelling. Says the ointment is not working. )   Recent hospitalization for bipolar manic episode with hallucinations. Continue current regimen of depakote, seroquel, invega, and lamictal. Continues f/u with home ACT team. Planned f/u with ACT team later today.   Ongoing L knee pain s/p recent L knee steroid injection by Dr Lorelei Pont 10/2017. Steroid shot did not help. Off voltaren gel and ultracet due to lack of effectiveness. R knee is doing ok. Known OA of knees last xray 2015.   DM - spacing out sugar checks to every few days.  HTN - taking losartan 50mg  daily, metoprolol 12.5mg  bid.   Relevant past medical, surgical, family and social history reviewed and updated as indicated. Interim medical history since our last visit reviewed. Allergies and medications reviewed and updated. Outpatient Medications Prior to Visit  Medication Sig Dispense Refill  . ACCU-CHEK AVIVA PLUS test strip 1 each by Other route daily. E11.21 100 each 3  . bisacodyl (DULCOLAX) 10 MG suppository Place 1 suppository (10 mg total) rectally daily as needed for moderate constipation. 12 suppository 0  . colchicine 0.6 MG tablet Take 0.6 mg by mouth daily. Take 2 tablets on first day, then 1 tablet daily for 10 day as needed for gout    . divalproex (DEPAKOTE) 500 MG DR tablet Take 1 tablet  (500 mg total) by mouth 2 (two) times daily with a meal. 60 tablet 3  . docusate sodium (COLACE) 100 MG capsule Take 2 capsules (200 mg total) by mouth 2 (two) times daily. 60 capsule 1  . lamoTRIgine (LAMICTAL) 25 MG tablet Take 2 tablets (50 mg total) by mouth 2 (two) times daily. 120 tablet 1  . levothyroxine (SYNTHROID, LEVOTHROID) 50 MCG tablet Take 1 tablet (50 mcg total) by mouth daily before breakfast. 30 tablet 11  . losartan (COZAAR) 50 MG tablet Take 1 tablet (50 mg total) by mouth daily. 30 tablet 11  . MELATONIN PO Take 1 tablet by mouth at bedtime.    . metFORMIN (GLUCOPHAGE) 500 MG tablet Take 1 tablet (500 mg total) by mouth daily with breakfast. 30 tablet 6  . metoprolol tartrate (LOPRESSOR) 25 MG tablet Take 0.5 tablets (12.5 mg total) by mouth 2 (two) times daily. 60 tablet 1  . paliperidone (INVEGA SUSTENNA) 234 MG/1.5ML SUSP injection Inject 234 mg into the muscle every 30 (thirty) days.    . polyethylene glycol powder (GLYCOLAX/MIRALAX) powder TAKE 17 GRAMS BY MOUTH TWICE DAILY AS NEEDED FOR MODERATE CONSTIPATION. 527 g 3  . QUEtiapine (SEROQUEL) 300 MG tablet Take 1 tablet (300 mg total) by mouth at bedtime. 30 tablet 1  . senna (SENOKOT) 8.6 MG TABS tablet Take 1 tablet (8.6 mg total) by mouth at bedtime  as needed for mild constipation. 120 each 0  . simvastatin (ZOCOR) 20 MG tablet Take 1 tablet (20 mg total) by mouth at bedtime. 30 tablet 11   No facility-administered medications prior to visit.      Per HPI unless specifically indicated in ROS section below Review of Systems     Objective:    BP 136/84 (BP Location: Left Arm, Patient Position: Sitting, Cuff Size: Normal)   Pulse 76   Temp 98.4 F (36.9 C) (Oral)   Ht 5\' 1"  (1.549 m)   Wt 201 lb 8 oz (91.4 kg)   SpO2 96%   BMI 38.07 kg/m   Wt Readings from Last 3 Encounters:  11/25/17 201 lb 8 oz (91.4 kg)  11/14/17 192 lb (87.1 kg)  11/13/17 198 lb (89.8 kg)    Physical Exam  Constitutional: She  appears well-developed and well-nourished. No distress.  Musculoskeletal: She exhibits edema (tr pitting).  Marked crepitus with flexion/extension L>R knees. Tender to palpation throughout Bilateral feet: No ankle ligament laxity, no malleolar pain, no pain at navicular or at base of 5th MT. 2+ DP bilaterally No toe pain. Edema bilateral dorsal midfoot with discomfort to palpation. Pain L>R heel.   Skin: Skin is warm and dry. No erythema.  Psychiatric: Thought content normal.  Expansive mood Rapid speech  Nursing note and vitals reviewed.    Lab Results  Component Value Date   CREATININE 1.95 (H) 11/13/2017   BUN 37 (H) 11/13/2017   NA 138 11/13/2017   K 5.7 (H) 11/13/2017   CL 103 11/13/2017   CO2 27 11/13/2017   Lab Results  Component Value Date   HGBA1C 5.9 (H) 11/14/2017       Assessment & Plan:   Problem List Items Addressed This Visit    Bipolar I disorder, most recent episode (or current) manic ([place])    Encouraged f/u with outpatient ACT team. Some signs of ongoing mania - encouraged discuss this with ACT team.       CKD (chronic kidney disease) stage 4, GFR 15-29 ml/min ([place])    Progressively deteriorated. Consider nephrology referral if remaining stage 4. Will need microalb next visit, consider UA      Controlled type 2 diabetes mellitus with diabetic nephropathy ([place])    Continue low dose metformin - may need to discontinue if next Cr >1.5.       Foot pain, bilateral - Primary    Anticipate this is more related to pedal edema. Encouraged conservative management treatment - leg elevation, good water hydration. Reassess at f/u visit 1 mo.      Gout    H/o this, but today's foot and knee pain not consistent with acute gout flare. See below.       HTN (hypertension), benign    Reviewed new dose of metoprolol 12.5mg  bid and losartan 50mg  daily.       Osteoarthritis of knee    Progressively worsening L>R, steroid injection last month with limited benefit.  Ultracet in hospital, voltaren gel ineffective. Avoid NSAIDs in h/o CKD. Avoid stronger narcotic due to concern over side effects at this time. Will Rx tramadol 50mg  BID PRN.  Discussed possible referral for surgery - pt desires to avoid if possible. Will see if pt is candidate for hyaluronate injections      Relevant Medications   colchicine 0.6 MG tablet   traMADol (ULTRAM) 50 MG tablet       Meds ordered this encounter  Medications  . traMADol (  ULTRAM) 50 MG tablet    Sig: Take 0.5-1 tablets (25-50 mg total) by mouth 2 (two) times daily as needed for moderate pain (knee pain).    Dispense:  30 tablet    Refill:  0   No orders of the defined types were placed in this encounter.   Follow up plan: Return in about 1 month (around 12/23/2017) for follow up visit.  Ria Bush, MD

## 2017-11-25 NOTE — Assessment & Plan Note (Signed)
Reviewed new dose of metoprolol 12.5mg  bid and losartan 50mg  daily.

## 2017-11-25 NOTE — Assessment & Plan Note (Signed)
H/o this, but today's foot and knee pain not consistent with acute gout flare. See below.

## 2017-11-25 NOTE — Patient Instructions (Addendum)
You have end stage arthritis of your left knee. We are limited in what we can use to treat this. May try tramadol sent to pharmacy for knee pain. I think foot pain is coming from swelling - elevate legs, make sure you stay well hydrated with water intake.  I don't think you have gout flare.  Let us know how tramadol pill helps feet and knees.  Return in 1 month for follow up visit.

## 2017-11-25 NOTE — Assessment & Plan Note (Addendum)
Continue low dose metformin - may need to discontinue if next Cr >1.5.

## 2017-11-25 NOTE — Assessment & Plan Note (Addendum)
Progressively worsening L>R, steroid injection last month with limited benefit. Ultracet in hospital, voltaren gel ineffective. Avoid NSAIDs in h/o CKD. Avoid stronger narcotic due to concern over side effects at this time. Will Rx tramadol 50mg  BID PRN.  Discussed possible referral for surgery - pt desires to avoid if possible. Will see if pt is candidate for hyaluronate injections

## 2017-11-25 NOTE — Assessment & Plan Note (Signed)
Encouraged f/u with outpatient ACT team. Some signs of ongoing mania - encouraged discuss this with ACT team.

## 2017-11-26 ENCOUNTER — Telehealth: Payer: Self-pay | Admitting: Family Medicine

## 2017-11-26 NOTE — Telephone Encounter (Signed)
Spoke with pt relaying message per Dr. Henriette Combs understanding and says she may wait until May to see how she does.

## 2017-11-26 NOTE — Telephone Encounter (Signed)
plz notify - if she's interested in discussing further treatment for knee (possible hyaluronate injection) I recommend she schedule follow up with our sports medicine Dr Lorelei Pont.

## 2017-11-28 ENCOUNTER — Encounter: Payer: Self-pay | Admitting: Emergency Medicine

## 2017-11-28 ENCOUNTER — Other Ambulatory Visit: Payer: Self-pay

## 2017-11-28 DIAGNOSIS — Z7984 Long term (current) use of oral hypoglycemic drugs: Secondary | ICD-10-CM | POA: Insufficient documentation

## 2017-11-28 DIAGNOSIS — I129 Hypertensive chronic kidney disease with stage 1 through stage 4 chronic kidney disease, or unspecified chronic kidney disease: Secondary | ICD-10-CM | POA: Insufficient documentation

## 2017-11-28 DIAGNOSIS — K5900 Constipation, unspecified: Secondary | ICD-10-CM | POA: Diagnosis not present

## 2017-11-28 DIAGNOSIS — Z85038 Personal history of other malignant neoplasm of large intestine: Secondary | ICD-10-CM | POA: Diagnosis not present

## 2017-11-28 DIAGNOSIS — K436 Other and unspecified ventral hernia with obstruction, without gangrene: Secondary | ICD-10-CM | POA: Diagnosis not present

## 2017-11-28 DIAGNOSIS — K469 Unspecified abdominal hernia without obstruction or gangrene: Secondary | ICD-10-CM | POA: Diagnosis not present

## 2017-11-28 DIAGNOSIS — R109 Unspecified abdominal pain: Secondary | ICD-10-CM | POA: Diagnosis present

## 2017-11-28 DIAGNOSIS — N184 Chronic kidney disease, stage 4 (severe): Secondary | ICD-10-CM | POA: Insufficient documentation

## 2017-11-28 DIAGNOSIS — Z79899 Other long term (current) drug therapy: Secondary | ICD-10-CM | POA: Diagnosis not present

## 2017-11-28 DIAGNOSIS — I10 Essential (primary) hypertension: Secondary | ICD-10-CM | POA: Diagnosis not present

## 2017-11-28 DIAGNOSIS — E1140 Type 2 diabetes mellitus with diabetic neuropathy, unspecified: Secondary | ICD-10-CM | POA: Diagnosis not present

## 2017-11-28 DIAGNOSIS — R1084 Generalized abdominal pain: Secondary | ICD-10-CM | POA: Diagnosis not present

## 2017-11-28 DIAGNOSIS — K439 Ventral hernia without obstruction or gangrene: Secondary | ICD-10-CM | POA: Diagnosis not present

## 2017-11-28 NOTE — ED Triage Notes (Signed)
Pt reports difficulty moving her bowels due to hernia; says she fell 2 weeks ago and thinks that when the hernia developed; no bowel movement in 2 days after after taking stool softener and increasing her diet; denies N/V; says she's having trouble sleeping;

## 2017-11-28 NOTE — ED Notes (Signed)
Patient stat desk via wheelchair by EMS from home.  Per EMS patient reports no bowel movement for couple days.  Reported history of hernia and is afraid she might "rupture" the hernia if she pushes to hard.  Patient recently discharged from hospital.  EMS reports vital signs with in normal limits.

## 2017-11-29 ENCOUNTER — Emergency Department
Admission: EM | Admit: 2017-11-29 | Discharge: 2017-11-29 | Disposition: A | Discharge status: Home / Self Care | Payer: Medicare Other | Attending: Emergency Medicine | Admitting: Emergency Medicine

## 2017-11-29 ENCOUNTER — Emergency Department: Payer: Medicare Other

## 2017-11-29 DIAGNOSIS — R1084 Generalized abdominal pain: Secondary | ICD-10-CM

## 2017-11-29 DIAGNOSIS — K439 Ventral hernia without obstruction or gangrene: Secondary | ICD-10-CM | POA: Diagnosis not present

## 2017-11-29 DIAGNOSIS — K5900 Constipation, unspecified: Secondary | ICD-10-CM | POA: Diagnosis not present

## 2017-11-29 LAB — CBC
HCT: 32 % — ABNORMAL LOW (ref 35.0–47.0)
HEMOGLOBIN: 11 g/dL — AB (ref 12.0–16.0)
MCH: 33.9 pg (ref 26.0–34.0)
MCHC: 34.3 g/dL (ref 32.0–36.0)
MCV: 98.9 fL (ref 80.0–100.0)
Platelets: 142 10*3/uL — ABNORMAL LOW (ref 150–440)
RBC: 3.24 MIL/uL — AB (ref 3.80–5.20)
RDW: 13.5 % (ref 11.5–14.5)
WBC: 6.1 10*3/uL (ref 3.6–11.0)

## 2017-11-29 LAB — COMPREHENSIVE METABOLIC PANEL
ALT: 23 U/L (ref 14–54)
ANION GAP: 7 (ref 5–15)
AST: 24 U/L (ref 15–41)
Albumin: 3.9 g/dL (ref 3.5–5.0)
Alkaline Phosphatase: 72 U/L (ref 38–126)
BUN: 32 mg/dL — ABNORMAL HIGH (ref 6–20)
CALCIUM: 8.8 mg/dL — AB (ref 8.9–10.3)
CHLORIDE: 105 mmol/L (ref 101–111)
CO2: 26 mmol/L (ref 22–32)
Creatinine, Ser: 1.73 mg/dL — ABNORMAL HIGH (ref 0.44–1.00)
GFR calc Af Amer: 34 mL/min — ABNORMAL LOW (ref >60)
GFR calc non Af Amer: 29 mL/min — ABNORMAL LOW (ref >60)
Glucose, Bld: 125 mg/dL — ABNORMAL HIGH (ref 65–99)
POTASSIUM: 4.9 mmol/L (ref 3.5–5.1)
SODIUM: 138 mmol/L (ref 135–145)
Total Bilirubin: 0.1 mg/dL — ABNORMAL LOW (ref 0.3–1.2)
Total Protein: 7 g/dL (ref 6.5–8.1)

## 2017-11-29 LAB — URINALYSIS, COMPLETE (UACMP) WITH MICROSCOPIC
BACTERIA UA: NONE SEEN
BILIRUBIN URINE: NEGATIVE
Glucose, UA: NEGATIVE mg/dL
Hgb urine dipstick: NEGATIVE
KETONES UR: NEGATIVE mg/dL
Leukocytes, UA: NEGATIVE
Nitrite: NEGATIVE
Protein, ur: NEGATIVE mg/dL
SPECIFIC GRAVITY, URINE: 1.004 — AB (ref 1.005–1.030)
pH: 6 (ref 5.0–8.0)

## 2017-11-29 LAB — LACTIC ACID, PLASMA: LACTIC ACID, VENOUS: 1.1 mmol/L (ref 0.5–1.9)

## 2017-11-29 LAB — LIPASE, BLOOD: LIPASE: 28 U/L (ref 11–51)

## 2017-11-29 LAB — TROPONIN I

## 2017-11-29 MED ORDER — BARIUM SULFATE 2.1 % PO SUSP
450.0000 mL | ORAL | Status: AC
Start: 2017-11-29 — End: 2017-11-29
  Administered 2017-11-29 (×2): 450 mL via ORAL

## 2017-11-29 MED ORDER — SODIUM CHLORIDE 0.9 % IV BOLUS
500.0000 mL | Freq: Once | INTRAVENOUS | Status: AC
  Administered 2017-11-29: 500 mL via INTRAVENOUS

## 2017-11-29 NOTE — ED Provider Notes (Signed)
[facility] Emergency Department Provider Note   ____________________________________________   First MD Initiated Contact with Patient 11/29/17 [phone]     (approximate)  I have reviewed the triage vital signs and the nursing notes.   HISTORY  Chief Complaint Abdominal Pain    HPI Gina [name] is a 67 y.o. female who presents to the ED from home via EMS with a chief complaint of constipation.  Patient states she has a known abdominal wall hernia, fell 2 weeks ago and thinks that 20 hernia developed.  Usually moves her bowels daily but has had no bowel movement in 2 days.  No relief after taking stool softener and eating raisin bran.  Denies associated fever, chills, chest pain, shortness of breath, abdominal pain, nausea, vomiting, dysuria, diarrhea.  Denies recent travel or trauma.   Past Medical History:  Diagnosis Date  . Anemia in chronic kidney disease   . Bipolar depression [facility])    sees psychiatrist - psych admission 03/2015  . Bladder cancer ([place]) 1990's  . Blood transfusion without reported diagnosis 2009  . Cataract    left  . CKD (chronic kidney disease) stage 3, GFR 30-59 ml/min (HCC)   . Colon cancer ([place]) 1990's  . DJD (degenerative joint disease), lumbar    chronic lower back pain  . Enterocutaneous fistula 04/07/2012   completed PT 06/2012 ([facility])  . Family history of breast cancer   . Family history of colon cancer   . Family history of stomach cancer   . GERD (gastroesophageal reflux disease)   . History of bladder cancer 1997  . History of colon cancer    s/p surgery  . History of uterine cancer    s/p hysterectomy  . Hyperlipidemia   . Hypertension   . IDA (iron deficiency anemia) 01/2013   thought due to h/o polyps  . Obesity (BMI 30-39.9)   . OSA on CPAP    6cm H2O  . Personal history of colonic adenomas and colon cancer 11/11/2012  . Positive hepatitis C antibody test 09/2014   but negative confirmatory  testing  . RBBB   . Uncontrolled type 2 diabetes mellitus with nephropathy ([place]) 06/07/2014   completed DSME 02/2016    Patient Active Problem List   Diagnosis Date Noted  . Foot pain, bilateral 11/25/2017  . Gallbladder polyp 10/21/2017  . Left shoulder pain 10/21/2017  . Benign neoplasm of ascending colon   . Low back pain radiating to left lower extremity 02/26/2017  . Acquired hypothyroidism 09/10/2016  . Thrombocytopenia ([place]) 06/15/2016  . Medicare annual wellness visit, subsequent 06/04/2016  . Advanced care planning/counseling discussion 06/04/2016  . External hemorrhoid 04/02/2016  . Slow transit constipation 01/04/2016  . Bipolar I disorder, most recent episode (or current) manic ([place]) 12/03/2015  . NAFLD (nonalcoholic fatty liver disease) 03/28/2015  . Gallbladder calculus without cholecystitis 03/28/2015  . Gout 03/27/2015  . HCV antibody positive 03/27/2015  . History of bladder cancer 03/27/2015  . History of bundle branch block 03/27/2015  . Genetic testing 02/28/2015  . Loss of memory 09/04/2014  . Controlled type 2 diabetes mellitus with diabetic nephropathy ([place]) 06/07/2014  . Hyperlipidemia   . Anemia in CKD (chronic kidney disease) 01/26/2013  . Personal history of colonic adenomas and colon cancer 11/11/2012  . Osteoarthritis of knee 10/02/2012  . OSA on CPAP   . CKD (chronic kidney disease) stage 4, GFR 15-29 ml/min (HCC)   . Severe obesity (BMI 35.0-39.9) with comorbidity ([place])   .  HTN (hypertension), benign 04/07/2012  . Hx of colon cancer, stage I 08/05/2011    Past Surgical History:  Procedure Laterality Date  . BREAST BIOPSY Right 01/2014   fibroadenoma  . COLONOSCOPY  07/2009  . COLONOSCOPY  11/2012   2 tubular adenomas, mild diverticulosis, pending genetic testing for Lynch syndrome [name]) rpt 2 yrs  . COLONOSCOPY  02/2015   TA, diverticulosis, rpt 2 yrs [name])  . COLONOSCOPY WITH PROPOFOL N/A 06/09/2017   TAx1, rpt 2 yrs [name], [name],  MD)  . DEXA  12/2009   WNL  . DOBUTAMINE STRESS ECHO  12/2009   no evidence of ischemia  . HERNIA REPAIR  02/04/12  . i & d abdominal wound  02/19/12  . LEFT OOPHORECTOMY  2005  . PARTIAL COLECTOMY  about 2008   for colon cancer  . PARTIAL HYSTERECTOMY  1981   uterine cancer, R ovary remains  . Reexploration of abdominal wound and Allograft placemet  02/26/12  . Removal of infected mesh and abdominal wound vac placement  02/24/12  . sleep study  02/2014   OSA - AHI 55, nadir 81% [name])    Prior to Admission medications   Medication Sig Start Date End Date Taking? Authorizing Provider  ACCU-CHEK AVIVA PLUS test strip 1 each by Other route daily. E11.21 09/18/17   Ria Bush, MD  bisacodyl (DULCOLAX) 10 MG suppository Place 1 suppository (10 mg total) rectally daily as needed for moderate constipation. 11/23/17   Pucilowska, Herma Ard B, MD  colchicine 0.6 MG tablet Take 0.6 mg by mouth daily. Take 2 tablets on first day, then 1 tablet daily for 10 day as needed for gout    [provider]  divalproex (DEPAKOTE) 500 MG DR tablet Take 1 tablet (500 mg total) by mouth 2 (two) times daily with a meal. 01/15/16   Ria Bush, MD  docusate sodium (COLACE) 100 MG capsule Take 2 capsules (200 mg total) by mouth 2 (two) times daily. 11/23/17   Pucilowska, Herma Ard B, MD  lamoTRIgine (LAMICTAL) 25 MG tablet Take 2 tablets (50 mg total) by mouth 2 (two) times daily. 11/24/17   Pucilowska, Wardell Honour, MD  levothyroxine (SYNTHROID, LEVOTHROID) 50 MCG tablet Take 1 tablet (50 mcg total) by mouth daily before breakfast. 10/21/17   Ria Bush, MD  losartan (COZAAR) 50 MG tablet Take 1 tablet (50 mg total) by mouth daily. 07/03/17   Ria Bush, MD  MELATONIN PO Take 1 tablet by mouth at bedtime.    [provider]  metFORMIN (GLUCOPHAGE) 500 MG tablet Take 1 tablet (500 mg total) by mouth daily with breakfast. 07/03/17   Ria Bush, MD  metoprolol tartrate (LOPRESSOR)  25 MG tablet Take 0.5 tablets (12.5 mg total) by mouth 2 (two) times daily. 11/23/17   Pucilowska, Jolanta B, MD  paliperidone (INVEGA SUSTENNA) 234 MG/1.5ML SUSP injection Inject 234 mg into the muscle every 30 (thirty) days.    [provider]  polyethylene glycol powder (GLYCOLAX/MIRALAX) powder TAKE 17 GRAMS BY MOUTH TWICE DAILY AS NEEDED FOR MODERATE CONSTIPATION. 05/11/17   Ria Bush, MD  QUEtiapine (SEROQUEL) 300 MG tablet Take 1 tablet (300 mg total) by mouth at bedtime. 11/09/17   Pucilowska, Wardell Honour, MD  senna (SENOKOT) 8.6 MG TABS tablet Take 1 tablet (8.6 mg total) by mouth at bedtime as needed for mild constipation. 11/09/17   Pucilowska, Jolanta B, MD  simvastatin (ZOCOR) 20 MG tablet Take 1 tablet (20 mg total) by mouth at bedtime. 07/03/17  Ria Bush, MD  traMADol (ULTRAM) 50 MG tablet Take 0.5-1 tablets (25-50 mg total) by mouth 2 (two) times daily as needed for moderate pain (knee pain). 11/25/17   Ria Bush, MD    Allergies Risperidone and related; Penicillins; Ivp dye [iodinated diagnostic agents]; Tetanus toxoids; and Zetia [ezetimibe]  Family History  Problem Relation Age of Onset  . Colon cancer Mother 36  . Stomach cancer Mother        dx in her 33s?  . CAD Father        MI; deceased 26  . Mental illness Sister        anxiety/depression  . Thyroid disease Sister   . Breast cancer Maternal Grandmother        age at dx unknown  . Diabetes Brother   . Diabetes Brother   . Diabetes Other        aunts and uncles both sides  . Arthritis Other        strong fmhx  . Colon cancer Other 85       maternal half-sister; deceased    Social History Social History   Tobacco Use  . Smoking status: Never Smoker  . Smokeless tobacco: Never Used  Substance Use Topics  . Alcohol use: No    Alcohol/week: 0.0 oz  . Drug use: No    Review of Systems  Constitutional: No fever/chills. Eyes: No visual changes. ENT: No sore  throat. Cardiovascular: Denies chest pain. Respiratory: Denies shortness of breath. Gastrointestinal: No abdominal pain.  No nausea, no vomiting.  No diarrhea.  Positive for constipation. Genitourinary: Negative for dysuria. Musculoskeletal: Negative for back pain. Skin: Negative for rash. Neurological: Negative for headaches, focal weakness or numbness.   ____________________________________________   PHYSICAL EXAM:  VITAL SIGNS: ED Triage Vitals [11/28/17 2324]  Enc Vitals Group     BP (!) 146/78     Pulse Rate 75     Resp      Temp 98.7 F (37.1 C)     Temp Source Oral     SpO2 100 %     Weight 200 lb (90.7 kg)     Height 5' 2"  (1.575 m)     Head Circumference      Peak Flow      Pain Score 6     Pain Loc      Pain Edu?      Excl. in [place]?     Constitutional: Alert and oriented. Well appearing and in no acute distress.  Very talkative. Eyes: Conjunctivae are normal. PERRL. EOMI. Head: Atraumatic. Nose: No congestion/rhinnorhea. Mouth/Throat: Mucous membranes are moist.  Oropharynx non-erythematous. Neck: No stridor.   Cardiovascular: Normal rate, regular rhythm. Grossly normal heart sounds.  Good peripheral circulation. Respiratory: Normal respiratory effort.  No retractions. Lungs CTAB. Gastrointestinal: Soft and nontender to light or deep palpation.  Large abdominal wall hernia noted.  No distention. No abdominal bruits. No CVA tenderness. Musculoskeletal: No lower extremity tenderness nor edema.  No joint effusions. Neurologic:  Normal speech and language. No gross focal neurologic deficits are appreciated. No gait instability. Skin:  Skin is warm, dry and intact. No rash noted. Psychiatric: Mood and affect are normal. Speech and behavior are normal.  ____________________________________________   LABS (all labs ordered are listed, but only abnormal results are displayed)  Labs Reviewed  COMPREHENSIVE METABOLIC PANEL - Abnormal; Notable for the following  components:      Result Value   Glucose, Bld 125 (*)  BUN 32 (*)    Creatinine, Ser 1.73 (*)    Calcium 8.8 (*)    Total Bilirubin 0.1 (*)    GFR calc non Af Amer 29 (*)    GFR calc Af Amer 34 (*)    All other components within normal limits  CBC - Abnormal; Notable for the following components:   RBC 3.24 (*)    Hemoglobin 11.0 (*)    HCT 32.0 (*)    Platelets 142 (*)    All other components within normal limits  URINALYSIS, COMPLETE (UACMP) WITH MICROSCOPIC - Abnormal; Notable for the following components:   Color, Urine COLORLESS (*)    APPearance CLEAR (*)    Specific Gravity, Urine 1.004 (*)    All other components within normal limits  LIPASE, BLOOD  TROPONIN I  LACTIC ACID, PLASMA  LACTIC ACID, PLASMA   ____________________________________________  EKG  ED ECG REPORT I, SUNG,JADE J, the attending physician, personally viewed and interpreted this ECG.   Date: 11/29/2017  EKG Time: 0006  Rate: 74  Rhythm: normal EKG, normal sinus rhythm  Axis: LAD  Intervals:right bundle branch block  ST&T Change: Nonspecific  ____________________________________________  RADIOLOGY  ED MD interpretation: CT pending  Official radiology report(s): No results found.  ____________________________________________   PROCEDURES  Procedure(s) performed: None  Procedures  Critical Care performed: No  ____________________________________________   INITIAL IMPRESSION / ASSESSMENT AND PLAN / ED COURSE  As part of my medical decision making, I reviewed the following data within the [place] notes reviewed and incorporated, Labs reviewed, EKG interpreted, Old chart reviewed, Radiograph reviewed and Notes from prior ED visits   67 year old female who presents with constipation and abdominal wall hernia.  Differential diagnosis includes but is not limited to constipation, ileus, small bowel obstruction, strangulated hernia, etc.  Laboratory  results remarkable for renal insufficiency improved from prior, mild anemia.  Will initiate IV fluid resuscitation, obtain CT abdomen/pelvis with oral contrast only to evaluate for intra-abdominal abnormalities.  Clinical Course as of Nov 30 703  Sun Nov 29, 2017  [id number] Patient currently in CT scan.  Care transferred to Dr. Karma Greaser pending results of urinalysis, CT scan and disposition.  Anticipate patient may be able to be discharged home if CT scan is unremarkable.   [JS]    Clinical Course User Index [JS] Paulette Blanch, MD     ____________________________________________   FINAL CLINICAL IMPRESSION(S) / ED DIAGNOSES  Final diagnoses:  Constipation, unspecified constipation type     ED Discharge Orders    None       Note:  This document was prepared using Dragon voice recognition software and may include unintentional dictation errors.    Paulette Blanch, MD 11/29/17 [phone]

## 2017-11-29 NOTE — ED Provider Notes (Signed)
-----------------------------------------   6:53 AM on 11/29/2017 -----------------------------------------  Assuming care from Dr. Beather Arbour.  In short, Gina [name] is a 67 y.o. female with a chief complaint of abdominal pain.  Refer to the original H&P for additional details.  The current plan of care is to follow up CT scan results, check UA, and reassess.  Anticipate discharge home barring any acute/emergent results on CT scan.   ----------------------------------------- 7:46 AM on 11/29/2017 -----------------------------------------  CT reassuring.  Demonstrates the known anterior hernia with loops of bowel but no evidence of obstruction.  Also notes a large amount of stool throughout and I suspect that constipation is the main cause of the patient's symptoms.  Urinalysis was within normal limits with no evidence of infection.  I have updated the patient about the results and provided information for a good outpatient bowel regimen.  I encouraged outpatient follow-up and gave my usual and customary return precautions.  She is concerned about the hernia but understands and agrees with the plan.  I will also provide follow-up information for her surgeon should she wish to discuss outpatient options for hernia repair.   Final diagnoses:  Constipation, unspecified constipation type  Abdominal wall hernia  Generalized abdominal pain      Hinda Kehr, MD 11/29/17 [phone]

## 2017-11-29 NOTE — ED Notes (Signed)
Pt has finished po contrast.  

## 2017-11-29 NOTE — Discharge Instructions (Signed)
You were seen in the emergency department today for abdominal pain, and your workup was reassuring.  There is no evidence of any blockage (obstruction) as a result of your hernia.  We believe your symptoms are caused by constipation, based on the amount of stool seen on the CT scan of your abdomen/pelvis.  We recommend that you use one or more of the following over-the-counter medications in the order described:   1)  Colace (or Dulcolax) 100 mg:  This is a stool softener, and you may take it once or twice a day as needed. 2)  Senna tablets:  This is a bowel stimulant that will help "push" out your stool. It is the next step to add after you have tried a stool softener. 3)  Miralax (powder):  This medication works by drawing additional fluid into your intestines and helps to flush out your stool.  Mix the powder with water or juice according to label instructions.  It may help if the Colace and Senna are not sufficient, but you must be sure to use the recommended amount of water or juice when you mix up the powder. 4)  Look for magnesium citrate at the pharmacy (it is usually a small glass bottle).  Drink the bottle according to the label instructions.  Remember that narcotic pain medications are constipating, so avoid them or minimize their use.  Drink plenty of fluids.  We also encourage you to press on your abdominal hernia when you are bearing down to have a bowel movement.  This will keep it from protruding out any farther.  You may also look for an abdominal binder or "belly band" at the pharmacy which will provide support at all times.    Please return to the Emergency Department immediately if you develop new or worsening symptoms that concern you, such as (but not limited to) fever > 101 degrees, severe abdominal pain, or persistent vomiting.

## 2017-12-01 ENCOUNTER — Telehealth: Payer: Self-pay

## 2017-12-01 NOTE — Telephone Encounter (Signed)
Copied from [place] [phone]. Topic: Inquiry >> Dec 01, 2017  8:53 AM Conception Chancy, NT wrote: Reason for CRM: patient is calling and states she was seen in the hospital on 11/29/17. She states they did multiple blood work and test but they did not give her any results. She would like to know if Dr. Darnell Level could call her with the results.  >> Dec 01, 2017  8:55 AM Conception Chancy, NT wrote: Patient also states that she was supposed to see the sports doctor at [facility] office. She would like the doctor name so that she can schedule appt.  >> Dec 01, 2017  8:57 AM Conception Chancy, NT wrote: Patient also states she is supposed to see a Kidney Doctor in June, but she does not have her phone number. Please contact patient.

## 2017-12-02 ENCOUNTER — Telehealth: Payer: Self-pay | Admitting: Family Medicine

## 2017-12-02 NOTE — Telephone Encounter (Signed)
1. Urine looked ok, kidneys remain impaired - she does have chronic kidney disease we are watching. Liver was normal, she had mild anemia which we will watch. Heart enzyme was normal.  2. Dr Lorelei Pont is our sports medicine doctor. 3. I'm not sure what kidney doctor she's seeing in June.

## 2017-12-02 NOTE — Telephone Encounter (Signed)
Pt was given Dr. Synthia Innocent message. [See TE, 12/02/17.]

## 2017-12-02 NOTE — Telephone Encounter (Signed)
Left message for pt to call back.  Need to relay Dr. G's message.  

## 2017-12-02 NOTE — Telephone Encounter (Signed)
Noted  

## 2017-12-02 NOTE — Telephone Encounter (Signed)
She called in for answers to some of her questions from Dr. Danise Mina.  I read Dr. Bosie Clos message to her.    She complained about her hospitalization.   She also complained about which kidney doctor she is to see.   I looked her the appt and let her know the doctor's name and the appt date and time.   She wrote it down.  I had a difficult time keeping her focused to give her the message.   She was all over the place in her conversation.

## 2017-12-03 ENCOUNTER — Ambulatory Visit: Payer: Self-pay | Admitting: *Deleted

## 2017-12-03 NOTE — Telephone Encounter (Signed)
Pt  Called with pressured speech she states she fell several weeks ago - Pt states is  requesting a brain . She has rambling speech she reports dizzy spells as well. She is  Requesting a call back.

## 2017-12-03 NOTE — Telephone Encounter (Signed)
Patients psychiatric Nurse  practioner called stating she had seen the patient recently and that she was displaying Manic behaviour and patient  had called her  telling her that she had fallen weeks ago and is requesting an MRI .She wanted input from PCP. Called  The patient she displayed flight of ideas and she displayed rambling pressured speech.She states she fell several weeks ago striking her head and she is asking to have a brain scan done she reports dizzy spells as well .She is also stating her sister  Devoria Glassing is having medical problems as well. Patients speech is pressured and loud, [place] the grammatical  error in the note entered at 220 pm. It should have stated pt is requesting a brain scan

## 2017-12-04 DIAGNOSIS — Z79899 Other long term (current) drug therapy: Secondary | ICD-10-CM | POA: Diagnosis not present

## 2017-12-04 NOTE — Telephone Encounter (Addendum)
Attempted to contact Dennis Bast at [phone] [facility]) and was told she is at [facility] office today.  Called that office at [phone] and got vm but did not leave a message.  I'll try again later.

## 2017-12-04 NOTE — Telephone Encounter (Addendum)
plz get contact info for psychiatric NP from [facility] - I don't see it in the chart. plz call psych NP - let her know I saw patient last week and she didn't complain of any headache or dizzy episodes or falls. I don't think she needs head CT at this time, but if ongoing concern recommend she come in for evaluation for this specifically. I am out of office next week but another provider can see her for this. More concerning is ongoing concern for mania/hypomania. Any planned change in med regimen to address this? She had rapid speech and expansive mood when I saw her last week, but it sounds like she's doing worse now. She's been in and out of hospital this past month 2 times and to ER once already last week.  See my last note for details.

## 2017-12-04 NOTE — Telephone Encounter (Signed)
Thank you :)

## 2017-12-04 NOTE — Telephone Encounter (Addendum)
Called the [place] office again and was told Margarita Grizzle (not Mickel Baas) is only at that office on Tues and Thurs.  [facility] office again and was given direct number, [phone] (cell?), to speak with Margarita Grizzle.  Spoke with Margarita Grizzle relaying Dr. Synthia Innocent message.  Verbalizes understanding.  Margarita Grizzle says she will check pt's Depakote level to see how to see if pt is taking it.  Saw pt Mon, 11/30/17, then spoke with pt on the phn yesterday about med change, which pt refused.  Margarita Grizzle recommends increase in med to treat mania/hypomania.  Also, thinks pt's Seroquel was slightly increased at one of her recent hospital visits.  She will keep Dr. Darnell Level updated.

## 2017-12-04 NOTE — Telephone Encounter (Signed)
The  Pyschiatric  NP that called yesterday  Name is  Dennis Bast  - She works for [employer] that has offices in [place] and [place]. The phone number is [phone]   And  [phone]

## 2017-12-05 LAB — CBC AND DIFFERENTIAL
Hemoglobin: 10.8 — AB (ref 12.0–16.0)
PLATELETS: 152 (ref 150–399)
WBC: 5.1

## 2017-12-05 LAB — BASIC METABOLIC PANEL
BUN: 25 — AB (ref 4–21)
CREATININE: 1.6 — AB (ref 0.5–1.1)
Glucose: 102
Potassium: 5.6 — AB (ref 3.4–5.3)

## 2017-12-07 ENCOUNTER — Telehealth: Payer: Self-pay | Admitting: Family Medicine

## 2017-12-07 NOTE — Telephone Encounter (Signed)
Pt last saw Dr Lorelei Pont 07/23/17 for inj in lt knee. Pt notified of this information. Pt said she fell 2-3 wks ago and is not sure if she fell on lt knee or not but having pain in lt knee and pt requesting appt with Dr Lorelei Pont for xray and injection in lt knee. Pt scheduled appt 12/09/17 at 12 noon with Dr Lorelei Pont.

## 2017-12-07 NOTE — Telephone Encounter (Signed)
Copied from [place] [phone]. Topic: Quick Communication - See Telephone Encounter >> Dec 07, 2017 11:08 AM Neva Seat wrote: Pt is needing to know the name of Dr that she needs to call for her shot in her knee.   Please call pt back to let pt know.  If she doesn't answer please leave message.

## 2017-12-09 ENCOUNTER — Ambulatory Visit (INDEPENDENT_AMBULATORY_CARE_PROVIDER_SITE_OTHER): Payer: Medicare Other | Admitting: Family Medicine

## 2017-12-09 ENCOUNTER — Ambulatory Visit (INDEPENDENT_AMBULATORY_CARE_PROVIDER_SITE_OTHER)
Admission: RE | Admit: 2017-12-09 | Discharge: 2017-12-09 | Disposition: A | Discharge status: Home / Self Care | Payer: Medicare Other | Source: Ambulatory Visit | Attending: Family Medicine | Admitting: Family Medicine

## 2017-12-09 ENCOUNTER — Encounter: Payer: Self-pay | Admitting: Family Medicine

## 2017-12-09 VITALS — BP 140/80 | HR 72 | Temp 98.5°F | Ht 61.0 in | Wt 203.0 lb

## 2017-12-09 DIAGNOSIS — M25562 Pain in left knee: Secondary | ICD-10-CM | POA: Diagnosis not present

## 2017-12-09 DIAGNOSIS — N184 Chronic kidney disease, stage 4 (severe): Secondary | ICD-10-CM | POA: Diagnosis not present

## 2017-12-09 DIAGNOSIS — M1712 Unilateral primary osteoarthritis, left knee: Secondary | ICD-10-CM | POA: Diagnosis not present

## 2017-12-09 DIAGNOSIS — M1000 Idiopathic gout, unspecified site: Secondary | ICD-10-CM | POA: Diagnosis not present

## 2017-12-09 DIAGNOSIS — F3110 Bipolar disorder, current episode manic without psychotic features, unspecified: Secondary | ICD-10-CM

## 2017-12-09 MED ORDER — HYLAN G-F 20 48 MG/6ML IX SOSY
48.0000 mg | PREFILLED_SYRINGE | Freq: Once | INTRA_ARTICULAR | Status: AC
  Administered 2017-12-09: 48 mg via INTRA_ARTICULAR

## 2017-12-09 NOTE — Progress Notes (Signed)
Dr. Frederico Hamman T. Demari Gales, MD, [facility] [place], [address] Phone: [phone] Fax: [phone]  12/09/2017  Patient: Gina [name], MRN: [id number], DOB: 22-Mar-1951, 67 y.o.  Primary Physician:  Ria Bush, MD   Chief Complaint  Patient presents with  . Knee Pain    Left   Subjective:   Gina [name] is a 67 y.o. very pleasant female patient who presents with the following:  L knee pain.  I remember the patient quite well, and I have seen her multiple times over many years.  Most recently I saw her for some bilateral knee pain approximately 2 months ago, where she was primarily concerned about left-sided knee pain and osteoarthritis.  She has had intermittent problems with both knees off and on for a number of years.  She is from [place], she is type I bipolar disorder.  She was recently hospitalized twice within the last couple of months for mania.  She reports compliance with her medication to me.  She is quite talkative.  She requires redirection.  She brings up approximately 10 times that someone told her that she had a "sports knee."  She has problems with her left knee walking and getting around, and this is not new and is been progressing over time, on the course of years.  She has had steroid injections in the past, but she has never had hyaluronic acid injection.  Past Medical History, Surgical History, Social History, Family History, Problem List, Medications, and Allergies have been reviewed and updated if relevant.  Patient Active Problem List   Diagnosis Date Noted  . Foot pain, bilateral 11/25/2017  . Gallbladder polyp 10/21/2017  . Left shoulder pain 10/21/2017  . Benign neoplasm of ascending colon   . Low back pain radiating to left lower extremity 02/26/2017  . Acquired hypothyroidism 09/10/2016  . Thrombocytopenia ([place]) 06/15/2016  . Medicare annual wellness visit,  subsequent 06/04/2016  . Advanced care planning/counseling discussion 06/04/2016  . External hemorrhoid 04/02/2016  . Slow transit constipation 01/04/2016  . Bipolar I disorder, most recent episode (or current) manic ([place]) 12/03/2015  . NAFLD (nonalcoholic fatty liver disease) 03/28/2015  . Gallbladder calculus without cholecystitis 03/28/2015  . Gout 03/27/2015  . HCV antibody positive 03/27/2015  . History of bladder cancer 03/27/2015  . History of bundle branch block 03/27/2015  . Genetic testing 02/28/2015  . Loss of memory 09/04/2014  . Controlled type 2 diabetes mellitus with diabetic nephropathy ([place]) 06/07/2014  . Hyperlipidemia   . Anemia in CKD (chronic kidney disease) 01/26/2013  . Personal history of colonic adenomas and colon cancer 11/11/2012  . Osteoarthritis of knee 10/02/2012  . OSA on CPAP   . CKD (chronic kidney disease) stage 4, GFR 15-29 ml/min (HCC)   . Severe obesity (BMI 35.0-39.9) with comorbidity ([place])   . HTN (hypertension), benign 04/07/2012  . Hx of colon cancer, stage I 08/05/2011    Past Medical History:  Diagnosis Date  . Anemia in chronic kidney disease   . Bipolar depression [facility])    sees psychiatrist - psych admission 03/2015  . Bladder cancer ([place]) 1990's  . Blood transfusion without reported diagnosis 2009  . Cataract    left  . CKD (chronic kidney disease) stage 3, GFR 30-59 ml/min (HCC)   . Colon cancer ([place]) 1990's  . DJD (degenerative joint disease), lumbar    chronic lower back pain  . Enterocutaneous fistula 04/07/2012   completed  PT 06/2012 ([facility])  . Family history of breast cancer   . Family history of colon cancer   . Family history of stomach cancer   . GERD (gastroesophageal reflux disease)   . History of bladder cancer 1997  . History of colon cancer    s/p surgery  . History of uterine cancer    s/p hysterectomy  . Hyperlipidemia   . Hypertension   . IDA (iron deficiency anemia) 01/2013   thought due to h/o polyps    . Obesity (BMI 30-39.9)   . OSA on CPAP    6cm H2O  . Personal history of colonic adenomas and colon cancer 11/11/2012  . Positive hepatitis C antibody test 09/2014   but negative confirmatory testing  . RBBB   . Uncontrolled type 2 diabetes mellitus with nephropathy ([place]) 06/07/2014   completed DSME 02/2016    Past Surgical History:  Procedure Laterality Date  . BREAST BIOPSY Right 01/2014   fibroadenoma  . COLONOSCOPY  07/2009  . COLONOSCOPY  11/2012   2 tubular adenomas, mild diverticulosis, pending genetic testing for Lynch syndrome [name]) rpt 2 yrs  . COLONOSCOPY  02/2015   TA, diverticulosis, rpt 2 yrs [name])  . COLONOSCOPY WITH PROPOFOL N/A 06/09/2017   TAx1, rpt 2 yrs [name], [name], MD)  . DEXA  12/2009   WNL  . DOBUTAMINE STRESS ECHO  12/2009   no evidence of ischemia  . HERNIA REPAIR  02/04/12  . i & d abdominal wound  02/19/12  . LEFT OOPHORECTOMY  2005  . PARTIAL COLECTOMY  about 2008   for colon cancer  . PARTIAL HYSTERECTOMY  1981   uterine cancer, R ovary remains  . Reexploration of abdominal wound and Allograft placemet  02/26/12  . Removal of infected mesh and abdominal wound vac placement  02/24/12  . sleep study  02/2014   OSA - AHI 55, nadir 81% [name])    Social History   Socioeconomic History  . Marital status: Single    Spouse name: Not on file  . Number of children: 0  . Years of education: Not on file  . Highest education level: Not on file  Occupational History  . Not on file  Social Needs  . Financial resource strain: Not on file  . Food insecurity:    Worry: Not on file    Inability: Not on file  . Transportation needs:    Medical: Not on file    Non-medical: Not on file  Tobacco Use  . Smoking status: Never Smoker  . Smokeless tobacco: Never Used  Substance and Sexual Activity  . Alcohol use: No    Alcohol/week: 0.0 oz  . Drug use: No  . Sexual activity: Never  Lifestyle  . Physical activity:    Days per week: Not on file     Minutes per session: Not on file  . Stress: Not on file  Relationships  . Social connections:    Talks on phone: Not on file    Gets together: Not on file    Attends religious service: Not on file    Active member of club or organization: Not on file    Attends meetings of clubs or organizations: Not on file    Relationship status: Not on file  . Intimate partner violence:    Fear of current or ex partner: Not on file    Emotionally abused: Not on file    Physically abused: Not on file  Forced sexual activity: Not on file  Other Topics Concern  . Not on file  Social History Narrative   Lives with sister, no pets   Occupation: disabled, for bipolar and arthritis   Edu: GED   Activity: take walks   Diet: good water, vegetables daily   Religion: [place], Dr. Jimmye Norman (ph 336 [phone])    Family History  Problem Relation Age of Onset  . Colon cancer Mother 66  . Stomach cancer Mother        dx in her 17s?  . CAD Father        MI; deceased 62  . Mental illness Sister        anxiety/depression  . Thyroid disease Sister   . Breast cancer Maternal Grandmother        age at dx unknown  . Diabetes Brother   . Diabetes Brother   . Diabetes Other        aunts and uncles both sides  . Arthritis Other        strong fmhx  . Colon cancer Other 5       maternal half-sister; deceased    Allergies  Allergen Reactions  . Risperidone And Related Other (See Comments)    Reaction:  Altered mental status   . Penicillins Other (See Comments)    Pt states that this med knocks her out.  Has patient had a PCN reaction causing immediate rash, facial/tongue/throat swelling, SOB or lightheadedness with hypotension Unsure  Has patient had a PCN reaction causing severe rash involving mucus membranes or skin necrosis Unsure  Has patient had a PCN reaction that required hospitalization Unsure  Has patient had a PCN reaction occurring within the last 10  years Unsure  If all of the above answers are "NO", then may proceed with Cephalosporin use.  Clementeen Hoof [Iodinated Diagnostic Agents] Rash  . Tetanus Toxoids Rash       . Zetia [Ezetimibe] Rash    Medication list reviewed and updated in full in [facility].  GEN: No fevers, chills. Nontoxic. Primarily MSK c/o today. MSK: Detailed in the HPI GI: tolerating PO intake without difficulty Neuro: No numbness, parasthesias, or tingling associated. Otherwise the pertinent positives of the ROS are noted above.   Objective:   BP 140/80   Pulse 72   Temp 98.5 F (36.9 C) (Oral)   Ht 5\' 1"  (1.549 m)   Wt 203 lb (92.1 kg)   BMI 38.36 kg/m    GEN: WDWN, NAD, Non-toxic, Alert & Oriented x 3 HEENT: Atraumatic, Normocephalic.  Ears and Nose: No external deformity. EXTR: No clubbing/cyanosis/edema NEURO: Normal gait.  PSYCH: Talkative, requires redirection.    Knee:  L Gait: Normal heel toe pattern ROM: lacks 4 deg ext, flexion to 95 Effusion: minimal Echymosis or edema: none Patellar tendon NT Painful PLICA: neg Patellar grind: negative Medial and lateral patellar facet loading: negative medial and lateral joint lines: medial and lateral joint line pain Mcmurray's pain Flexion-pinch pain Varus and valgus stress: stable Lachman: neg Ant and Post drawer: neg Hip abduction, IR, ER: WNL Hip flexion str: 5/5 Hip abd: 5/5 Quad: 4+/5 VMO atrophy:No Hamstring concentric and eccentric: 5/5   Radiology: X-rays: AP Bilateral Weight-bearing, Weightbearing Lateral, Sunrise views Indication: knee pain Findings: The patient has end-stage bone-on-bone pathology tricompartmental osteoarthritis.  There is no evidence for acute fracture or dislocation.  Diffuse osteophytosis is present throughout much of  the knee. Electronically Signed  By: Owens Loffler, MD On: 12/09/2017 2:06 PM   Assessment and Plan:   Primary osteoarthritis of left knee - Plan: Hylan SOSY 48 mg, Ambulatory  referral to [place]  Left knee pain, unspecified chronicity - Plan: DG Knee Complete 4 Views Left, Ambulatory referral to Home Health  Bipolar I disorder, most recent episode (or current) manic ([place]) - Plan: Ambulatory referral to Home Health  CKD (chronic kidney disease) stage 4, GFR 15-29 ml/min (HCC)  Idiopathic gout, unspecified chronicity, unspecified site   >40 minutes spent in face to face time with patient, >50% spent in counselling or coordination of care   The patient does appear to be at least mildly hypomanic today.  She does not appear to be a threat to herself.  She is sit shows no signs of psychosis.  She has been actively involved with behavioral health care since her recent hospitalization.  Certainly steroids should play no role currently.  Additional time was needed in this exam to redirect the patient and tried to answer all of her questions.  She does have end-stage degenerative changes of her left knee.  Ultimately nothing short of a total knee arthroplasty would significantly help the situation.  It is worthwhile trying some hyaluronic acid, and it may help.  Chronic kidney disease as well as her psychiatric state also limit some of the other medications that we might typically use for advanced arthritis.  Knee Injection: Synvisc-One, L Patient verbally consented to procedure. Risks (including infection), benefits, and alternatives explained. Sterilely prepped with Chloraprep. Ethyl cholride used for anesthesia, then 7 cc of lidocaine used through anteromedial portal, but bone blocked needle passage. Therefore, 7 cc of Lidocaine 1% used for anesthesia in the anterolateral position. Reprepped with Chloraprep.  Anterolateral approach used to inject joint without difficulty, injected with Synvisc-One, 6 mL. No complications with procedure and tolerated well.   Follow-up: prn only  Meds ordered this encounter  Medications  . Hylan SOSY 48 mg   Orders Placed This  Encounter  Procedures  . DG Knee Complete 4 Views Left    Signed,  Alexandria Shiflett T. Brennon Otterness, MD   Allergies as of 12/09/2017      Reactions   Risperidone And Related Other (See Comments)   Reaction:  Altered mental status    Penicillins Other (See Comments)   Pt states that this med knocks her out.  Has patient had a PCN reaction causing immediate rash, facial/tongue/throat swelling, SOB or lightheadedness with hypotension Unsure  Has patient had a PCN reaction causing severe rash involving mucus membranes or skin necrosis Unsure  Has patient had a PCN reaction that required hospitalization Unsure  Has patient had a PCN reaction occurring within the last 10 years Unsure  If all of the above answers are "NO", then may proceed with Cephalosporin use.   Ivp Dye [iodinated Diagnostic Agents] Rash   Tetanus Toxoids Rash      Zetia [ezetimibe] Rash      Medication List        Accurate as of 12/09/17  2:06 PM. Always use your most recent med list.          ACCU-CHEK AVIVA PLUS test strip Generic drug:  glucose blood 1 each by Other route daily. E11.21   bisacodyl 10 MG suppository Commonly known as:  DULCOLAX Place 1 suppository (10 mg total) rectally daily as needed for moderate constipation.   colchicine 0.6 MG tablet Take 0.6 mg by mouth daily.  Take 2 tablets on first day, then 1 tablet daily for 10 day as needed for gout   divalproex 500 MG DR tablet Commonly known as:  DEPAKOTE Take 1 tablet (500 mg total) by mouth 2 (two) times daily with a meal.   docusate sodium 100 MG capsule Commonly known as:  COLACE Take 2 capsules (200 mg total) by mouth 2 (two) times daily.   INVEGA SUSTENNA 234 MG/1.5ML Susp injection Generic drug:  paliperidone Inject 234 mg into the muscle every 30 (thirty) days.   lamoTRIgine 25 MG tablet Commonly known as:  LAMICTAL Take 2 tablets (50 mg total) by mouth 2 (two) times daily.   levothyroxine 50 MCG tablet Commonly known as:  SYNTHROID,  LEVOTHROID Take 1 tablet (50 mcg total) by mouth daily before breakfast.   losartan 50 MG tablet Commonly known as:  COZAAR Take 1 tablet (50 mg total) by mouth daily.   MELATONIN PO Take 1 tablet by mouth at bedtime.   metFORMIN 500 MG tablet Commonly known as:  GLUCOPHAGE Take 1 tablet (500 mg total) by mouth daily with breakfast.   metoprolol tartrate 25 MG tablet Commonly known as:  LOPRESSOR Take 0.5 tablets (12.5 mg total) by mouth 2 (two) times daily.   polyethylene glycol powder powder Commonly known as:  GLYCOLAX/MIRALAX TAKE 17 GRAMS BY MOUTH TWICE DAILY AS NEEDED FOR MODERATE CONSTIPATION.   QUEtiapine 300 MG tablet Commonly known as:  SEROQUEL Take 1 tablet (300 mg total) by mouth at bedtime.   senna 8.6 MG Tabs tablet Commonly known as:  SENOKOT Take 1 tablet (8.6 mg total) by mouth at bedtime as needed for mild constipation.   simvastatin 20 MG tablet Commonly known as:  ZOCOR Take 1 tablet (20 mg total) by mouth at bedtime.   traMADol 50 MG tablet Commonly known as:  ULTRAM Take 0.5-1 tablets (25-50 mg total) by mouth 2 (two) times daily as needed for moderate pain (knee pain).

## 2017-12-11 ENCOUNTER — Other Ambulatory Visit: Payer: Self-pay | Admitting: Family Medicine

## 2017-12-11 DIAGNOSIS — E875 Hyperkalemia: Secondary | ICD-10-CM

## 2017-12-14 ENCOUNTER — Telehealth: Payer: Self-pay

## 2017-12-14 DIAGNOSIS — M170 Bilateral primary osteoarthritis of knee: Secondary | ICD-10-CM

## 2017-12-14 DIAGNOSIS — G8929 Other chronic pain: Secondary | ICD-10-CM

## 2017-12-14 NOTE — Telephone Encounter (Signed)
Pt said she has been taking tramadol 50 mg 1 1/2 tab twice a day. Pt wanted to know how often can she take med in hours; advised 8 - 12 hours and I advised pt rx reads take 1/2 - 1 tab not 1 1/2 tab. Pt voiced understanding. Pt also wanted to know if she could take miralax every day. Advised per rx instructions to take as needed for constipation, so on days with problem with constipation can take 17 gms prn bid. Pt voiced understanding. FYI to DR G.

## 2017-12-17 NOTE — Telephone Encounter (Signed)
Pt states the tramdol is not working and wants to know if she can get a different and more affordable medication sent to   [place], [place] - [place] [phone] (Phone) [phone] (Fax)

## 2017-12-18 ENCOUNTER — Other Ambulatory Visit (INDEPENDENT_AMBULATORY_CARE_PROVIDER_SITE_OTHER): Payer: Medicare Other

## 2017-12-18 DIAGNOSIS — E875 Hyperkalemia: Secondary | ICD-10-CM

## 2017-12-18 DIAGNOSIS — G8929 Other chronic pain: Secondary | ICD-10-CM | POA: Insufficient documentation

## 2017-12-18 LAB — POTASSIUM: POTASSIUM: 5 meq/L (ref 3.5–5.1)

## 2017-12-18 MED ORDER — HYDROCODONE-ACETAMINOPHEN 5-325 MG PO TABS: 1.0000 | ORAL_TABLET | Freq: Three times a day (TID) | ORAL | 0 refills | Status: DC | PRN

## 2017-12-18 NOTE — Telephone Encounter (Signed)
Pt calling to f/u on request for a different medication. She checked with both her pharmacy and they stated nothing received. Pt states she saw Dr. Darnell Level this morning and he said he would check msgs. Please notify pt when RX sent in. Requesting today if possible.  [id number] [place], [place] [phone](Phone) [phone](Fax)

## 2017-12-18 NOTE — Telephone Encounter (Signed)
Left message on vm per dpr relaying Dr. Synthia Innocent message and instructions.  Asked pt to call back to schedule f/u OV for next week for pain and starting new pain med.

## 2017-12-18 NOTE — Addendum Note (Signed)
Addended by: Ria Bush on: 12/18/2017 04:59 PM   Modules accepted: Orders

## 2017-12-18 NOTE — Telephone Encounter (Addendum)
Spoke with patient at [facility]. Ongoing knee pain despite hyaluronic injection last week. Tramadol ineffective. Recommend we try low dose narcotic hydrocodone/tylenol. STOP tramadol.  I will send this to pharmacy. She needs to schedule appt next week for office visit to f/u on pain and starting new pain med.

## 2017-12-21 NOTE — Telephone Encounter (Signed)
Left message on vm per dpr relaying Dr. Synthia Innocent message and instructions.  Asked pt to call back to schedule f/u OV this week for pain and starting new pain med.

## 2017-12-22 ENCOUNTER — Other Ambulatory Visit: Payer: Self-pay

## 2017-12-22 ENCOUNTER — Other Ambulatory Visit: Payer: Self-pay | Admitting: Family Medicine

## 2017-12-22 DIAGNOSIS — Z1231 Encounter for screening mammogram for malignant neoplasm of breast: Secondary | ICD-10-CM

## 2017-12-22 NOTE — Telephone Encounter (Addendum)
Pt is scheduled for 12/30/17 @ 12:45 PM

## 2017-12-23 ENCOUNTER — Encounter: Payer: Self-pay | Admitting: Family Medicine

## 2017-12-30 ENCOUNTER — Ambulatory Visit (INDEPENDENT_AMBULATORY_CARE_PROVIDER_SITE_OTHER): Payer: Medicare Other | Admitting: Family Medicine

## 2017-12-30 ENCOUNTER — Encounter: Payer: Self-pay | Admitting: Family Medicine

## 2017-12-30 VITALS — BP 130/82 | HR 74 | Temp 98.4°F | Ht 61.0 in | Wt 201.0 lb

## 2017-12-30 DIAGNOSIS — M79671 Pain in right foot: Secondary | ICD-10-CM | POA: Diagnosis not present

## 2017-12-30 DIAGNOSIS — G8929 Other chronic pain: Secondary | ICD-10-CM | POA: Diagnosis not present

## 2017-12-30 DIAGNOSIS — E039 Hypothyroidism, unspecified: Secondary | ICD-10-CM | POA: Diagnosis not present

## 2017-12-30 DIAGNOSIS — M170 Bilateral primary osteoarthritis of knee: Secondary | ICD-10-CM

## 2017-12-30 DIAGNOSIS — F3110 Bipolar disorder, current episode manic without psychotic features, unspecified: Secondary | ICD-10-CM | POA: Diagnosis not present

## 2017-12-30 NOTE — Assessment & Plan Note (Addendum)
Unclear etiology - does not pinpoint site of maximal pain. ?plantar fasciitis. Doubt calcaneal fracture or achilles tendinopathy. Encouraged regular use of supportive shoeware.

## 2017-12-30 NOTE — Assessment & Plan Note (Signed)
Will recheck at next visit. Last med titration was 10/2017.

## 2017-12-30 NOTE — Assessment & Plan Note (Addendum)
Actually much more calm today and able to be directed. No further manic symptoms. Denies significant med change at this time. I wonder if as pain is better controlled mood has been better managed. Now off tramadol.

## 2017-12-30 NOTE — Assessment & Plan Note (Signed)
Actually endorses significant improvement in L knee pain after hyaluronic acid injection. Considering R knee injection. She only used a few pills of hydrocodone last week and this was effective, none since. Interested in outpatient physical therapy for knees - will refer.

## 2017-12-30 NOTE — Patient Instructions (Addendum)
Continue current medicines. We will refer you to outpatient physical therapy for the knees.  Stop tramadol, ok to continue hydrocodone as needed for breakthrough knee pain. Controlled substance agreement today.  Return in 2 months for follow up visit - ok to cancel June appointment.

## 2017-12-30 NOTE — Assessment & Plan Note (Signed)
[place] CSRS reviewed last week.  Hydrocodone sparingly more effective than regular tramadol. Ok to continue. Reviewed chronic narcotic therapy as well as risks/benefits including but not limited to constipation, dependence and habit forming nature of medication.

## 2017-12-30 NOTE — Progress Notes (Signed)
BP 130/82 (BP Location: Left Arm, Patient Position: Sitting, Cuff Size: Normal)   Pulse 74   Temp 98.4 F (36.9 C) (Oral)   Ht 5\' 1"  (1.549 m)   Wt 201 lb (91.2 kg)   SpO2 95%   BMI 37.98 kg/m    CC: chronic pain visit f/u Subjective:    Patient ID: Gina [name], female    DOB: [birth year], 67 y.o.   MRN: [id number]  HPI: Gina [name] is a 67 y.o. female presenting on 12/30/2017 for Follow-up   Known severe knee DJD/osteoarthritis. Ongoing knee pain despite steroid injections, hyaluronic injections. Tramadol has been ineffective. Earlier this month we started low dose narcotic to try to help better control pain. Here for f/u.     Has tried a few doses of hydrocodone - which has helped. Actually pain is improved - hyaluronic shots may have been effective. Considering return to Dr Lorelei Pont for R knee injection. No constipation with narcotic (also takes miralax and stool softener). Interested in outpatient physical therapy. Thinks would be able to get [employer] transportation service to drive her to outpatient PT.   Mood wise doing better. Reviewed med regimen - taking as prescribed. Taking naps during the day, sleeping well at night, feels well rested.   Relevant past medical, surgical, family and social history reviewed and updated as indicated. Interim medical history since our last visit reviewed. Allergies and medications reviewed and updated. Outpatient Medications Prior to Visit  Medication Sig Dispense Refill  . ACCU-CHEK AVIVA PLUS test strip 1 each by Other route daily. E11.21 100 each 3  . bisacodyl (DULCOLAX) 10 MG suppository Place 1 suppository (10 mg total) rectally daily as needed for moderate constipation. 12 suppository 0  . colchicine 0.6 MG tablet Take 0.6 mg by mouth daily. Take 2 tablets on first day, then 1 tablet daily for 10 day as needed for gout    . divalproex (DEPAKOTE) 500 MG DR tablet Take 1 tablet (500 mg total) by mouth 2 (two) times  daily with a meal. 60 tablet 3  . docusate sodium (COLACE) 100 MG capsule Take 2 capsules (200 mg total) by mouth 2 (two) times daily. 60 capsule 1  . HYDROcodone-acetaminophen (NORCO/VICODIN) 5-325 MG tablet Take 1 tablet by mouth 3 (three) times daily as needed for moderate pain. 15 tablet 0  . lamoTRIgine (LAMICTAL) 25 MG tablet Take 2 tablets (50 mg total) by mouth 2 (two) times daily. 120 tablet 1  . levothyroxine (SYNTHROID, LEVOTHROID) 50 MCG tablet Take 1 tablet (50 mcg total) by mouth daily before breakfast. 30 tablet 11  . losartan (COZAAR) 50 MG tablet Take 1 tablet (50 mg total) by mouth daily. 30 tablet 11  . MELATONIN PO Take 1 tablet by mouth at bedtime.    . metFORMIN (GLUCOPHAGE) 500 MG tablet Take 1 tablet (500 mg total) by mouth daily with breakfast. 30 tablet 6  . metoprolol tartrate (LOPRESSOR) 25 MG tablet Take 0.5 tablets (12.5 mg total) by mouth 2 (two) times daily. 60 tablet 1  . paliperidone (INVEGA SUSTENNA) 234 MG/1.5ML SUSP injection Inject 234 mg into the muscle every 30 (thirty) days.    . polyethylene glycol powder (GLYCOLAX/MIRALAX) powder TAKE 17 GRAMS BY MOUTH TWICE DAILY AS NEEDED FOR MODERATE CONSTIPATION. 527 g 3  . QUEtiapine (SEROQUEL) 300 MG tablet Take 1 tablet (300 mg total) by mouth at bedtime. 30 tablet 1  . senna (SENOKOT) 8.6 MG TABS tablet Take 1 tablet (8.6 mg total) by  mouth at bedtime as needed for mild constipation. 120 each 0  . simvastatin (ZOCOR) 20 MG tablet Take 1 tablet (20 mg total) by mouth at bedtime. 30 tablet 11  . traMADol (ULTRAM) 50 MG tablet Take 0.5-1 tablets (25-50 mg total) by mouth 2 (two) times daily as needed for moderate pain (knee pain). 30 tablet 0   No facility-administered medications prior to visit.      Per HPI unless specifically indicated in ROS section below Review of Systems     Objective:    BP 130/82 (BP Location: Left Arm, Patient Position: Sitting, Cuff Size: Normal)   Pulse 74   Temp 98.4 F (36.9 C)  (Oral)   Ht 5\' 1"  (1.549 m)   Wt 201 lb (91.2 kg)   SpO2 95%   BMI 37.98 kg/m   Wt Readings from Last 3 Encounters:  12/30/17 201 lb (91.2 kg)  12/09/17 203 lb (92.1 kg)  11/28/17 200 lb (90.7 kg)    Physical Exam  Constitutional: She appears well-developed and well-nourished. No distress.  Musculoskeletal: She exhibits no edema.  L knee brace in place R foot - diffuse tenderness to palpation at L heel sole, no point tenderness along achilles tendon or insertion, uncomfortable with calcaneal squeeze test, difficult to pinpoint area of maximal pain. No ligament laxity. Distal pulses intact  Nursing note and vitals reviewed.  Results for orders placed or performed in visit on 12/23/17  CBC and differential  Result Value Ref Range   Hemoglobin 10.8 (A) 12.0 - 16.0   Platelets 152 150 - 399   WBC 5.1   Basic metabolic panel  Result Value Ref Range   Glucose 102    BUN 25 (A) 4 - 21   Creatinine 1.6 (A) 0.5 - 1.1   Potassium 5.6 (A) 3.4 - 5.3   Lab Results  Component Value Date   HGBA1C 5.9 (H) 11/14/2017    Lab Results  Component Value Date   TSH 5.852 (H) 11/14/2017      Assessment & Plan:   Problem List Items Addressed This Visit    Acquired hypothyroidism    Will recheck at next visit. Last med titration was 10/2017.      Bipolar I disorder, most recent episode (or current) manic ([place])    Actually much more calm today and able to be directed. No further manic symptoms. Denies significant med change at this time. I wonder if as pain is better controlled mood has been better managed. Now off tramadol.       Encounter for chronic pain management    [place] CSRS reviewed last week.  Hydrocodone sparingly more effective than regular tramadol. Ok to continue. Reviewed chronic narcotic therapy as well as risks/benefits including but not limited to constipation, dependence and habit forming nature of medication.       Osteoarthritis of knee - Primary    Actually endorses  significant improvement in L knee pain after hyaluronic acid injection. Considering R knee injection. She only used a few pills of hydrocodone last week and this was effective, none since. Interested in outpatient physical therapy for knees - will refer.      Relevant Orders   Ambulatory referral to Physical Therapy   Pain of right heel    Unclear etiology - does not pinpoint site of maximal pain. ?plantar fasciitis. Doubt calcaneal fracture or achilles tendinopathy. Encouraged regular use of supportive shoeware.          No orders of the  defined types were placed in this encounter.  Orders Placed This Encounter  Procedures  . Ambulatory referral to Physical Therapy    Referral Priority:   Routine    Referral Type:   Physical Medicine    Referral Reason:   Specialty Services Required    Requested Specialty:   Physical Therapy    Number of Visits Requested:   1    Follow up plan: Return in about 2 months (around 03/01/2018) for follow up visit.  Ria Bush, MD

## 2018-01-06 ENCOUNTER — Encounter: Payer: Self-pay | Admitting: Family Medicine

## 2018-01-06 ENCOUNTER — Ambulatory Visit (INDEPENDENT_AMBULATORY_CARE_PROVIDER_SITE_OTHER): Payer: Medicare Other | Admitting: Family Medicine

## 2018-01-06 ENCOUNTER — Ambulatory Visit: Payer: Self-pay | Admitting: Family Medicine

## 2018-01-06 VITALS — BP 140/64 | HR 71 | Temp 98.2°F | Ht 61.0 in | Wt 200.5 lb

## 2018-01-06 DIAGNOSIS — M1711 Unilateral primary osteoarthritis, right knee: Secondary | ICD-10-CM

## 2018-01-06 MED ORDER — DICLOFENAC SODIUM 1 % TD GEL: 4.0000 g | Freq: Four times a day (QID) | TRANSDERMAL | 5 refills | Status: DC

## 2018-01-06 MED ORDER — HYLAN G-F 20 48 MG/6ML IX SOSY
48.0000 mg | PREFILLED_SYRINGE | Freq: Once | INTRA_ARTICULAR | Status: AC
  Administered 2018-01-06: 48 mg via INTRA_ARTICULAR

## 2018-01-06 NOTE — Progress Notes (Signed)
Dr. Frederico Hamman T. Mozetta Murfin, MD, [facility] [place], [address] Phone: [phone] Fax: [phone]  01/06/2018  Patient: Gina [name], MRN: [id number], DOB: [birth year], 67 y.o.  Primary Physician:  Ria Bush, MD   Chief Complaint  Patient presents with  . Knee Pain    Right   Subjective:   Gina [name] is a 67 y.o. very pleasant female patient who presents with the following:  R knee OA, did well with hyaluronic acid injection on the Left. Notably decreased pain, and she would like to have Synvisc on her Right knee.   Knee Injection: Synvisc-One Patient verbally consented to procedure. Risks (including infection), benefits, and alternatives explained. Sterilely prepped with Chloraprep. Ethyl cholride used for anesthesia, then 7 cc of Lidocaine 1% used for anesthesia in the anterolateral position. Reprepped with Chloraprep.  Anterolateral approach used to inject joint without difficulty, injected with Synvisc-One, 6 mL. No complications with procedure and tolerated well.   Primary osteoarthritis of right knee - Plan: Hylan SOSY 48 mg  Topical NSAID trial for OA, multijoint, as well.  Follow-up: 6 months if needed  Meds ordered this encounter  Medications  . Hylan SOSY 48 mg  . diclofenac sodium (VOLTAREN) 1 % GEL    Sig: Apply 4 g topically 4 (four) times daily. To knees    Dispense:  5 Tube    Refill:  5   Signed,  Annye Forrey T. Finian Helvey, MD   Allergies as of 01/06/2018      Reactions   Risperidone And Related Other (See Comments)   Reaction:  Altered mental status    Penicillins Other (See Comments)   Pt states that this med knocks her out.  Has patient had a PCN reaction causing immediate rash, facial/tongue/throat swelling, SOB or lightheadedness with hypotension Unsure  Has patient had a PCN reaction causing severe rash involving mucus membranes or skin necrosis Unsure  Has  patient had a PCN reaction that required hospitalization Unsure  Has patient had a PCN reaction occurring within the last 10 years Unsure  If all of the above answers are "NO", then may proceed with Cephalosporin use.   Ivp Dye [iodinated Diagnostic Agents] Rash   Tetanus Toxoids Rash      Zetia [ezetimibe] Rash      Medication List        Accurate as of 01/06/18  3:44 PM. Always use your most recent med list.          ACCU-CHEK AVIVA PLUS test strip Generic drug:  glucose blood 1 each by Other route daily. E11.21   bisacodyl 10 MG suppository Commonly known as:  DULCOLAX Place 1 suppository (10 mg total) rectally daily as needed for moderate constipation.   colchicine 0.6 MG tablet Take 0.6 mg by mouth daily. Take 2 tablets on first day, then 1 tablet daily for 10 day as needed for gout   diclofenac sodium 1 % Gel Commonly known as:  VOLTAREN Apply 4 g topically 4 (four) times daily. To knees   divalproex 500 MG DR tablet Commonly known as:  DEPAKOTE Take 1 tablet (500 mg total) by mouth 2 (two) times daily with a meal.   docusate sodium 100 MG capsule Commonly known as:  COLACE Take 2 capsules (200 mg total) by mouth 2 (two) times daily.   HYDROcodone-acetaminophen 5-325 MG tablet Commonly known as:  NORCO/VICODIN Take 1 tablet by mouth 3 (three) times daily as needed  for moderate pain.   INVEGA SUSTENNA 234 MG/1.5ML Susp injection Generic drug:  paliperidone Inject 234 mg into the muscle every 30 (thirty) days.   lamoTRIgine 25 MG tablet Commonly known as:  LAMICTAL Take 2 tablets (50 mg total) by mouth 2 (two) times daily.   levothyroxine 50 MCG tablet Commonly known as:  SYNTHROID, LEVOTHROID Take 1 tablet (50 mcg total) by mouth daily before breakfast.   losartan 50 MG tablet Commonly known as:  COZAAR Take 1 tablet (50 mg total) by mouth daily.   MELATONIN PO Take 1 tablet by mouth at bedtime.   metFORMIN 500 MG tablet Commonly known as:   GLUCOPHAGE Take 1 tablet (500 mg total) by mouth daily with breakfast.   metoprolol tartrate 25 MG tablet Commonly known as:  LOPRESSOR Take 0.5 tablets (12.5 mg total) by mouth 2 (two) times daily.   polyethylene glycol powder powder Commonly known as:  GLYCOLAX/MIRALAX TAKE 17 GRAMS BY MOUTH TWICE DAILY AS NEEDED FOR MODERATE CONSTIPATION.   QUEtiapine 300 MG tablet Commonly known as:  SEROQUEL Take 1 tablet (300 mg total) by mouth at bedtime.   senna 8.6 MG Tabs tablet Commonly known as:  SENOKOT Take 1 tablet (8.6 mg total) by mouth at bedtime as needed for mild constipation.   simvastatin 20 MG tablet Commonly known as:  ZOCOR Take 1 tablet (20 mg total) by mouth at bedtime.

## 2018-01-20 ENCOUNTER — Ambulatory Visit: Payer: Medicare Other | Attending: Family Medicine

## 2018-01-20 ENCOUNTER — Other Ambulatory Visit: Payer: Self-pay

## 2018-01-20 DIAGNOSIS — M25562 Pain in left knee: Secondary | ICD-10-CM | POA: Diagnosis not present

## 2018-01-20 DIAGNOSIS — M25661 Stiffness of right knee, not elsewhere classified: Secondary | ICD-10-CM | POA: Diagnosis not present

## 2018-01-20 DIAGNOSIS — M6281 Muscle weakness (generalized): Secondary | ICD-10-CM | POA: Diagnosis not present

## 2018-01-20 DIAGNOSIS — M25662 Stiffness of left knee, not elsewhere classified: Secondary | ICD-10-CM | POA: Diagnosis not present

## 2018-01-20 DIAGNOSIS — G8929 Other chronic pain: Secondary | ICD-10-CM | POA: Diagnosis not present

## 2018-01-20 DIAGNOSIS — M25561 Pain in right knee: Secondary | ICD-10-CM | POA: Diagnosis not present

## 2018-01-20 NOTE — Therapy (Signed)
[facility] [address] [place], [address] Phone: [phone]   Fax:  [phone]  Physical Therapy Evaluation  Patient Details  Name: Gina [name] MRN: [id number] Date of Birth: 10/30/1950 No data recorded  Encounter Date: 01/20/2018  PT End of Session - 01/20/18 1323    Visit Number  1    Number of Visits  13    Date for PT Re-Evaluation  03/03/18    PT Start Time  1100    PT Stop Time  1200    PT Time Calculation (min)  60 min    Activity Tolerance  Patient tolerated treatment well    Behavior During Therapy  [facility] for tasks assessed/performed       Past Medical History:  Diagnosis Date  . Anemia in chronic kidney disease   . Bipolar depression [facility])    sees psychiatrist - psych admission 03/2015  . Bladder cancer ([place]) 1990's  . Blood transfusion without reported diagnosis 2009  . Cataract    left  . CKD (chronic kidney disease) stage 3, GFR 30-59 ml/min (HCC)   . Colon cancer ([place]) 1990's  . DJD (degenerative joint disease), lumbar    chronic lower back pain  . Enterocutaneous fistula 04/07/2012   completed PT 06/2012 ([facility])  . Family history of breast cancer   . Family history of colon cancer   . Family history of stomach cancer   . GERD (gastroesophageal reflux disease)   . History of bladder cancer 1997  . History of colon cancer    s/p surgery  . History of uterine cancer    s/p hysterectomy  . Hyperlipidemia   . Hypertension   . IDA (iron deficiency anemia) 01/2013   thought due to h/o polyps  . Obesity (BMI 30-39.9)   . OSA on CPAP    6cm H2O  . Personal history of colonic adenomas and colon cancer 11/11/2012  . Positive hepatitis C antibody test 09/2014   but negative confirmatory testing  . RBBB   . Uncontrolled type 2 diabetes mellitus with nephropathy ([place]) 06/07/2014   completed DSME 02/2016    Past Surgical History:  Procedure Laterality Date  . BREAST  BIOPSY Right 01/2014   fibroadenoma  . COLONOSCOPY  07/2009  . COLONOSCOPY  11/2012   2 tubular adenomas, mild diverticulosis, pending genetic testing for Lynch syndrome [name]) rpt 2 yrs  . COLONOSCOPY  02/2015   TA, diverticulosis, rpt 2 yrs [name])  . COLONOSCOPY WITH PROPOFOL N/A 06/09/2017   TAx1, rpt 2 yrs [name], [name], MD)  . DEXA  12/2009   WNL  . DOBUTAMINE STRESS ECHO  12/2009   no evidence of ischemia  . HERNIA REPAIR  02/04/12  . i & d abdominal wound  02/19/12  . LEFT OOPHORECTOMY  2005  . PARTIAL COLECTOMY  about 2008   for colon cancer  . PARTIAL HYSTERECTOMY  1981   uterine cancer, R ovary remains  . Reexploration of abdominal wound and Allograft placemet  02/26/12  . Removal of infected mesh and abdominal wound vac placement  02/24/12  . sleep study  02/2014   OSA - AHI 55, nadir 81% ([place])    There were no vitals filed for this visit.   Subjective Assessment - 01/20/18 1257    Subjective  Patient reports that she has been having bilateral knee pain for over a year and that the pain is worse in her L knee. Pt  also reports thatshe has had PT in the past on her ankle and knee many years ago but felt that it "did not work". Pt states that she has fallen multiple times in the past year with the most recent fall being approximately 1 month ago from tripping on a step. Pt stated that current pain in L knee is 10/10 and R is 6/10 and that this pain does not get any better. Pt received "sports needles" in both knees a few weeks ago and feels that it is relieving some of her pain. In addition, pt is currently wearing OTC neoprene knee brace with patella opening that she feels provides some pain relief.      Patient is accompained by:  Family member    Limitations  Standing;Walking    How long can you stand comfortably?  30 minutes    How long can you walk comfortably?  10 minutes    Patient Stated Goals  decrease pain, go on walks with no pain    Currently in Pain?  Yes     Pain Score  6     Pain Location  Knee    Pain Orientation  Right    Pain Descriptors / Indicators  Aching    Pain Type  Chronic pain    Pain Onset  More than a month ago    Pain Frequency  Constant    Multiple Pain Sites  Yes    Pain Score  10    Pain Location  Knee    Pain Orientation  Left    Pain Descriptors / Indicators  Aching    Pain Type  Chronic pain    Pain Onset  More than a month ago    Pain Frequency  Constant    Aggravating Factors   "bending", walking >10 minutes, standing >30 minutes, sit<>stand from chair without UE suport    Pain Relieving Factors  elevation         [facility] PT Assessment - 01/20/18 1336      Assessment   Medical Diagnosis  B knee osteoarthritis    Onset Date/Surgical Date  12/20/17    Prior Therapy  Yes      Balance Screen   Has the patient fallen in the past 6 months  Yes    How many times?  2    Has the patient had a decrease in activity level because of a fear of falling?   Yes      [place]  Private residence    Living Arrangements  Other relatives      Prior Function   Vocation  Retired      Associate Professor   Overall Cognitive Status  Within Functional Limits for tasks assessed      Observation/Other Assessments   Focus on Therapeutic Outcomes (FOTO)   46      Functional Tests   Functional tests  Single leg stance;Sit to Stand      Single Leg Stance   Comments  ~1s B      Sit to Stand   Comments  uses upper extremities from normal height chair, able to complete when chair heigh is increased without using UEs but has increased knee pain      Posture/Postural Control   Posture/Postural Control  Postural limitations    Postural Limitations  Forward head;Increased thoracic kyphosis      ROM / Strength   AROM / PROM / Strength  AROM;Strength  AROM   Overall AROM   Deficits    AROM Assessment Site  Knee    Right/Left Knee  Left;Right    Right Knee Extension  15    Right Knee Flexion  110     Left Knee Extension  15    Left Knee Flexion  100      Strength   Strength Assessment Site  Knee;Hip    Right/Left Hip  Right;Left    Right Hip Flexion  3+/5    Right Hip External Rotation   3/5    Right Hip Internal Rotation  4/5    Right Hip ABduction  3+/5    Right Hip ADduction  4/5    Left Hip Flexion  3+/5    Left Hip External Rotation  3+/5    Left Hip Internal Rotation  4/5    Left Hip ABduction  3+/5    Right/Left Knee  Right;Left    Right Knee Flexion  4/5    Right Knee Extension  4/5    Left Knee Flexion  4/5    Left Knee Extension  4/5      Transfers   Five time sit to stand comments   24.19 seconds      Ambulation/Gait   Ambulation/Gait  Yes    Gait Pattern  Decreased arm swing - right;Decreased arm swing - left;Decreased stance time - left;Decreased stride length;Antalgic;Decreased trunk rotation    Stairs  Yes    Stair Management Technique  Two rails;Step to pattern    Number of Stairs  4    Height of Stairs  6      6 minute walk test results    Aerobic Endurance Distance Walked  775 feet    Endurance additional comments  pt reported knee pain increase at ~2 minutes       Standardized Balance Assessment   Standardized Balance Assessment  Five Times Sit to Stand;10 meter walk test    Five times sit to stand comments   24.19 seconds    10 Meter Walk  .75 m/s        Objective measurements completed on examination: See above findings.   TREATMENT:  Therapeutic Exercise: Ambulation with focus on improving walking and step length -- 75ft with improving step length and ambulation speed  Patient demonstrate increased fatigue at the end of the session.      PT Education - 01/20/18 1308    Education Details  Patient educated on PT plan of care.    Person(s) Educated  Patient    Methods  Explanation    Comprehension  Verbalized understanding          PT Long Term Goals - 01/20/18 1602      PT LONG TERM GOAL #1   Title  Patient will increase  SLS to >/= 10s B in order to improve balance and decrease fall risk.    Baseline  ~1s B    Time  4    Period  Weeks    Status  New    Target Date  02/17/18      PT LONG TERM GOAL #2   Title  Pt will improve hip strength (flex and ER) to 4/5 or greater in order to improve functional mobility and return to prior level of function    Baseline  3+/5    Time  6    Period  Weeks    Status  New    Target Date  03/03/18  PT LONG TERM GOAL #3   Title  Pt will report worst pain in past week <8/10 in L knee and <4/10 in R knee in order to improve mobility and return to prior level of function.     Baseline  10/10 L, 6/10 L    Time  4    Period  Weeks    Status  New    Target Date  02/17/18      PT LONG TERM GOAL #4   Title  Pt will repot worst pain in past week <6/10 L knee and <2/10 R knee in order to improve mobility and return to prior level of function.    Baseline  10/10 L, 6/10 R    Time  6    Period  Weeks    Status  New    Target Date  03/03/18      PT LONG TERM GOAL #5   Title  Pt will improve FOTO score from 46 to 57 in order to demonstrate improvement in daily function.    Baseline  46    Time  6    Period  Weeks    Target Date  03/03/18      Additional Long Term Goals   Additional Long Term Goals  Yes      PT LONG TERM GOAL #6   Title  Pt will improve 6MWT by 112ft in order to show a clincially significant increase in muscular endurance and cardiovascular endurance.    Baseline  775 ft    Time  6    Period  Weeks    Status  New    Target Date  03/03/18      PT LONG TERM GOAL #7   Title  Pt will improve 10 meter gait speed from .75 m/s to >1 m/s in order to demonstrate decreased fall risk.    Baseline  .75 m/s    Time  6    Period  Weeks    Status  New    Target Date  03/03/18      PT LONG TERM GOAL #8   Title  Pt will improve 5xSTS from 24.19 s to <12 s  in order to demonstrate a clinically significant improvement in functional lower extremity strength  and reduce risk of falls..    Baseline  24.19 s    Time  6    Period  Weeks    Status  New    Target Date  03/03/18             Plan - 01/20/18 1536    Clinical Impression Statement  Pt is 67 y.o F that present with increased bilateral knee pain starting aproximately 1.5 months ago. Pt presents with increased bilateral knee and lower extremity dysfunction indicated by decreased B hip strength, decreased B knee ROM (flex and ext), and impaired balance. Pt also demonstrates poor endurance as indicated by 6 minute walk test distance of 775 feet and poor functional strength/fall risk as indicated by 5xSTS time of 24.19 seconds. Pt will benefit from skilled PT to address deficits listed above and return to prior level of function.    Clinical Presentation  Evolving    Clinical Decision Making  Moderate    Rehab Potential  Fair    Clinical Impairments Affecting Rehab Potential  chronic pain, multiple comorbidities    PT Frequency  2x / week    PT Duration  6 weeks    PT Treatment/Interventions  ADLs/Self Care Home Management;Cryotherapy;Moist Heat;Electrical Stimulation;Stair training;Gait training;Therapeutic activities;Therapeutic exercise;Neuromuscular re-education;Balance training;Patient/family education;Manual techniques;Dry needling;Passive range of motion    PT Next Visit Plan  manual techniques, quad strengthening exercsise, terminal knee extension, nu step for endurance and LE strengthening     Consulted and Agree with Plan of Care  Patient       Patient will benefit from skilled therapeutic intervention in order to improve the following deficits and impairments:  Abnormal gait, Decreased activity tolerance, Decreased endurance, Decreased range of motion, Decreased strength, Hypomobility, Pain, Postural dysfunction, Difficulty walking, Decreased mobility, Decreased balance  Visit Diagnosis: Chronic pain of left knee  Chronic pain of right knee  Muscle weakness  (generalized)  Stiffness of left knee, not elsewhere classified  Stiffness of right knee, not elsewhere classified     Problem List Patient Active Problem List   Diagnosis Date Noted  . Pain of right heel 12/30/2017  . Encounter for chronic pain management 12/18/2017  . Foot pain, bilateral 11/25/2017  . Gallbladder polyp 10/21/2017  . Left shoulder pain 10/21/2017  . Benign neoplasm of ascending colon   . Low back pain radiating to left lower extremity 02/26/2017  . Acquired hypothyroidism 09/10/2016  . Thrombocytopenia ([place]) 06/15/2016  . Medicare annual wellness visit, subsequent 06/04/2016  . Advanced care planning/counseling discussion 06/04/2016  . External hemorrhoid 04/02/2016  . Slow transit constipation 01/04/2016  . Bipolar I disorder, most recent episode (or current) manic ([place]) 12/03/2015  . NAFLD (nonalcoholic fatty liver disease) 03/28/2015  . Gallbladder calculus without cholecystitis 03/28/2015  . Gout 03/27/2015  . HCV antibody positive 03/27/2015  . History of bladder cancer 03/27/2015  . History of bundle branch block 03/27/2015  . Genetic testing 02/28/2015  . Loss of memory 09/04/2014  . Controlled type 2 diabetes mellitus with diabetic nephropathy ([place]) 06/07/2014  . Hyperlipidemia   . Anemia in CKD (chronic kidney disease) 01/26/2013  . Personal history of colonic adenomas and colon cancer 11/11/2012  . Osteoarthritis of knee 10/02/2012  . OSA on CPAP   . CKD (chronic kidney disease) stage 4, GFR 15-29 ml/min (HCC)   . Severe obesity (BMI 35.0-39.9) with comorbidity ([place])   . HTN (hypertension), benign 04/07/2012  . Hx of colon cancer, stage I 08/05/2011    Georg Ruddle, SPT 01/21/2018, 10:28 AM  [place] [facility] [address] [place], [address] Phone: [phone]   Fax:  [phone]  Name: Mabel Roll MRN: [id number] Date of Birth: 1951/02/24

## 2018-01-21 ENCOUNTER — Ambulatory Visit: Payer: Self-pay | Admitting: Family Medicine

## 2018-01-25 ENCOUNTER — Ambulatory Visit: Payer: Medicare Other

## 2018-01-25 DIAGNOSIS — M25662 Stiffness of left knee, not elsewhere classified: Secondary | ICD-10-CM | POA: Diagnosis not present

## 2018-01-25 DIAGNOSIS — M25561 Pain in right knee: Secondary | ICD-10-CM | POA: Diagnosis not present

## 2018-01-25 DIAGNOSIS — M25661 Stiffness of right knee, not elsewhere classified: Secondary | ICD-10-CM

## 2018-01-25 DIAGNOSIS — M6281 Muscle weakness (generalized): Secondary | ICD-10-CM

## 2018-01-25 DIAGNOSIS — M25562 Pain in left knee: Secondary | ICD-10-CM | POA: Diagnosis not present

## 2018-01-25 DIAGNOSIS — G8929 Other chronic pain: Secondary | ICD-10-CM | POA: Diagnosis not present

## 2018-01-25 NOTE — Therapy (Signed)
[facility] [facility] [address] [place], [address] Phone: [phone]   Fax:  [phone]  Physical Therapy Treatment  Patient Details  Name: Gae Bihl MRN: [id number] Date of Birth: September 16, 1950 No data recorded  Encounter Date: 01/25/2018  PT End of Session - 01/25/18 1600    Visit Number  2    Number of Visits  13    Date for PT Re-Evaluation  03/03/18    PT Start Time  9892    PT Stop Time  1530    PT Time Calculation (min)  44 min    Activity Tolerance  Patient tolerated treatment well    Behavior During Therapy  [facility] for tasks assessed/performed       Past Medical History:  Diagnosis Date  . Anemia in chronic kidney disease   . Bipolar depression [facility])    sees psychiatrist - psych admission 03/2015  . Bladder cancer ([place]) 1990's  . Blood transfusion without reported diagnosis 2009  . Cataract    left  . CKD (chronic kidney disease) stage 3, GFR 30-59 ml/min (HCC)   . Colon cancer ([place]) 1990's  . DJD (degenerative joint disease), lumbar    chronic lower back pain  . Enterocutaneous fistula 04/07/2012   completed PT 06/2012 ([facility])  . Family history of breast cancer   . Family history of colon cancer   . Family history of stomach cancer   . GERD (gastroesophageal reflux disease)   . History of bladder cancer 1997  . History of colon cancer    s/p surgery  . History of uterine cancer    s/p hysterectomy  . Hyperlipidemia   . Hypertension   . IDA (iron deficiency anemia) 01/2013   thought due to h/o polyps  . Obesity (BMI 30-39.9)   . OSA on CPAP    6cm H2O  . Personal history of colonic adenomas and colon cancer 11/11/2012  . Positive hepatitis C antibody test 09/2014   but negative confirmatory testing  . RBBB   . Uncontrolled type 2 diabetes mellitus with nephropathy ([place]) 06/07/2014   completed DSME 02/2016    Past Surgical History:  Procedure Laterality Date  . BREAST  BIOPSY Right 01/2014   fibroadenoma  . COLONOSCOPY  07/2009  . COLONOSCOPY  11/2012   2 tubular adenomas, mild diverticulosis, pending genetic testing for Lynch syndrome [name]) rpt 2 yrs  . COLONOSCOPY  02/2015   TA, diverticulosis, rpt 2 yrs [name])  . COLONOSCOPY WITH PROPOFOL N/A 06/09/2017   TAx1, rpt 2 yrs [name], [name], MD)  . DEXA  12/2009   WNL  . DOBUTAMINE STRESS ECHO  12/2009   no evidence of ischemia  . HERNIA REPAIR  02/04/12  . i & d abdominal wound  02/19/12  . LEFT OOPHORECTOMY  2005  . PARTIAL COLECTOMY  about 2008   for colon cancer  . PARTIAL HYSTERECTOMY  1981   uterine cancer, R ovary remains  . Reexploration of abdominal wound and Allograft placemet  02/26/12  . Removal of infected mesh and abdominal wound vac placement  02/24/12  . sleep study  02/2014   OSA - AHI 55, nadir 81% ([place])    There were no vitals filed for this visit.  Subjective Assessment - 01/25/18 1451    Subjective  Pt reports that knees are feeling a little better today compared to last week. Pt states that she was very tired after the previous visit and  felt muscle soreness. Current pain is 5/10 R 8/10 L.     Limitations  Standing;Walking    Pain Score  5     Pain Location  Knee    Pain Orientation  Right    Pain Descriptors / Indicators  Aching    Pain Type  Chronic pain    Pain Onset  More than a month ago    Pain Frequency  Constant    Multiple Pain Sites  Yes    Pain Score  8    Pain Location  Knee    Pain Orientation  Left    Pain Descriptors / Indicators  Aching    Pain Type  Chronic pain    Pain Onset  More than a month ago    Pain Frequency  Constant        TREATMENT Therapeutic Exercise Nu step seat 8 level 3 x8 minutes to improve LE strength and muscular endurance STS from chair with airex pad 5x3 (pt fatigued with activity) Leg press on OMEGA machine BLE 35# x20 (difficulty getting into machine, attempt standing leg press with TB next session) 3 inch step ups B  x20 (verbal cues required for technique, able to decrease UE support for last 10 reps)   Patient fatigued at end of treatment sesion.    PT Education - 01/25/18 1559    Education Details  Patient educated on proper form and technique for therapeutic exercises.    Person(s) Educated  Patient    Methods  Explanation;Demonstration    Comprehension  Verbalized understanding;Verbal cues required;Returned demonstration          PT Long Term Goals - 01/20/18 1602      PT LONG TERM GOAL #1   Title  Patient will increase SLS to >/= 10s B in order to improve balance and decrease fall risk.    Baseline  ~1s B    Time  4    Period  Weeks    Status  New    Target Date  02/17/18      PT LONG TERM GOAL #2   Title  Pt will improve hip strength (flex and ER) to 4/5 or greater in order to improve functional mobility and return to prior level of function    Baseline  3+/5    Time  6    Period  Weeks    Status  New    Target Date  03/03/18      PT LONG TERM GOAL #3   Title  Pt will report worst pain in past week <8/10 in L knee and <4/10 in R knee in order to improve mobility and return to prior level of function.     Baseline  10/10 L, 6/10 L    Time  4    Period  Weeks    Status  New    Target Date  02/17/18      PT LONG TERM GOAL #4   Title  Pt will repot worst pain in past week <6/10 L knee and <2/10 R knee in order to improve mobility and return to prior level of function.    Baseline  10/10 L, 6/10 R    Time  6    Period  Weeks    Status  New    Target Date  03/03/18      PT LONG TERM GOAL #5   Title  Pt will improve FOTO score from 46 to 57 in order to demonstrate  improvement in daily function.    Baseline  46    Time  6    Period  Weeks    Target Date  03/03/18      Additional Long Term Goals   Additional Long Term Goals  Yes      PT LONG TERM GOAL #6   Title  Pt will improve 6MWT by 186ft in order to show a clincially significant increase in muscular endurance and  cardiovascular endurance.    Baseline  775 ft    Time  6    Period  Weeks    Status  New    Target Date  03/03/18      PT LONG TERM GOAL #7   Title  Pt will improve 10 meter gait speed from .75 m/s to >1 m/s in order to demonstrate decreased fall risk.    Baseline  .75 m/s    Time  6    Period  Weeks    Status  New    Target Date  03/03/18      PT LONG TERM GOAL #8   Title  Pt will improve 5xSTS from 24.19 s to <12 s  in order to demonstrate a clinically significant improvement in functional lower extremity strength and reduce risk of falls..    Baseline  24.19 s    Time  6    Period  Weeks    Status  New    Target Date  03/03/18            Plan - 01/25/18 1601    Clinical Impression Statement  Pt presents with significant BLE weakness demonstrated by ability to perform 3" step up requiring UE support and need for airex on chair for sit to stands. Pt also presents with poor tolerance to exercise requiring frequent rest breaks throughout therapeutic exercises. Pt will continue to benefit from skilled PT in order to return to prior level of function.    Clinical Impairments Affecting Rehab Potential  chronic pain, multiple comorbidities    PT Frequency  2x / week    PT Duration  6 weeks    PT Next Visit Plan  nu step, higher step up, balance, hip strengthening     Consulted and Agree with Plan of Care  Patient       Patient will benefit from skilled therapeutic intervention in order to improve the following deficits and impairments:  Abnormal gait, Decreased activity tolerance, Decreased endurance, Decreased range of motion, Decreased strength, Hypomobility, Pain, Postural dysfunction, Difficulty walking, Decreased mobility, Decreased balance  Visit Diagnosis: Chronic pain of left knee  Chronic pain of right knee  Muscle weakness (generalized)  Stiffness of left knee, not elsewhere classified  Stiffness of right knee, not elsewhere classified     Problem  List Patient Active Problem List   Diagnosis Date Noted  . Pain of right heel 12/30/2017  . Encounter for chronic pain management 12/18/2017  . Foot pain, bilateral 11/25/2017  . Gallbladder polyp 10/21/2017  . Left shoulder pain 10/21/2017  . Benign neoplasm of ascending colon   . Low back pain radiating to left lower extremity 02/26/2017  . Acquired hypothyroidism 09/10/2016  . Thrombocytopenia ([place]) 06/15/2016  . Medicare annual wellness visit, subsequent 06/04/2016  . Advanced care planning/counseling discussion 06/04/2016  . External hemorrhoid 04/02/2016  . Slow transit constipation 01/04/2016  . Bipolar I disorder, most recent episode (or current) manic ([place]) 12/03/2015  . NAFLD (nonalcoholic fatty liver disease) 03/28/2015  .  Gallbladder calculus without cholecystitis 03/28/2015  . Gout 03/27/2015  . HCV antibody positive 03/27/2015  . History of bladder cancer 03/27/2015  . History of bundle branch block 03/27/2015  . Genetic testing 02/28/2015  . Loss of memory 09/04/2014  . Controlled type 2 diabetes mellitus with diabetic nephropathy ([place]) 06/07/2014  . Hyperlipidemia   . Anemia in CKD (chronic kidney disease) 01/26/2013  . Personal history of colonic adenomas and colon cancer 11/11/2012  . Osteoarthritis of knee 10/02/2012  . OSA on CPAP   . CKD (chronic kidney disease) stage 4, GFR 15-29 ml/min (HCC)   . Severe obesity (BMI 35.0-39.9) with comorbidity ([place])   . HTN (hypertension), benign 04/07/2012  . Hx of colon cancer, stage I 08/05/2011    Georg Ruddle, SPT 01/25/2018, 4:06 PM  [facility] [address], [place], [address] Phone: [phone]   Fax:  [phone]  Name: Galya Dunnigan MRN: [id number] Date of Birth: 1950-11-04

## 2018-01-28 ENCOUNTER — Ambulatory Visit: Payer: Medicare Other

## 2018-01-28 DIAGNOSIS — M25561 Pain in right knee: Secondary | ICD-10-CM | POA: Diagnosis not present

## 2018-01-28 DIAGNOSIS — M25661 Stiffness of right knee, not elsewhere classified: Secondary | ICD-10-CM

## 2018-01-28 DIAGNOSIS — M25662 Stiffness of left knee, not elsewhere classified: Secondary | ICD-10-CM | POA: Diagnosis not present

## 2018-01-28 DIAGNOSIS — M6281 Muscle weakness (generalized): Secondary | ICD-10-CM | POA: Diagnosis not present

## 2018-01-28 DIAGNOSIS — G8929 Other chronic pain: Secondary | ICD-10-CM | POA: Diagnosis not present

## 2018-01-28 DIAGNOSIS — M25562 Pain in left knee: Principal | ICD-10-CM

## 2018-01-28 NOTE — Therapy (Signed)
[facility] [address] [place], [address] Phone: [phone]   Fax:  [phone]  Physical Therapy Treatment  Patient Details  Name: Gina [name] MRN: [id number] Date of Birth: 1950-11-10 No data recorded  Encounter Date: 01/28/2018  PT End of Session - 01/28/18 1325    Visit Number  3    Number of Visits  13    Date for PT Re-Evaluation  03/03/18    PT Start Time  1325    PT Stop Time  1410    PT Time Calculation (min)  45 min    Activity Tolerance  Patient tolerated treatment well    Behavior During Therapy  [facility] for tasks assessed/performed       Past Medical History:  Diagnosis Date  . Anemia in chronic kidney disease   . Bipolar depression [facility])    sees psychiatrist - psych admission 03/2015  . Bladder cancer ([place]) 1990's  . Blood transfusion without reported diagnosis 2009  . Cataract    left  . CKD (chronic kidney disease) stage 3, GFR 30-59 ml/min (HCC)   . Colon cancer ([place]) 1990's  . DJD (degenerative joint disease), lumbar    chronic lower back pain  . Enterocutaneous fistula 04/07/2012   completed PT 06/2012 ([facility])  . Family history of breast cancer   . Family history of colon cancer   . Family history of stomach cancer   . GERD (gastroesophageal reflux disease)   . History of bladder cancer 1997  . History of colon cancer    s/p surgery  . History of uterine cancer    s/p hysterectomy  . Hyperlipidemia   . Hypertension   . IDA (iron deficiency anemia) 01/2013   thought due to h/o polyps  . Obesity (BMI 30-39.9)   . OSA on CPAP    6cm H2O  . Personal history of colonic adenomas and colon cancer 11/11/2012  . Positive hepatitis C antibody test 09/2014   but negative confirmatory testing  . RBBB   . Uncontrolled type 2 diabetes mellitus with nephropathy ([place]) 06/07/2014   completed DSME 02/2016    Past Surgical History:  Procedure Laterality Date  . BREAST  BIOPSY Right 01/2014   fibroadenoma  . COLONOSCOPY  07/2009  . COLONOSCOPY  11/2012   2 tubular adenomas, mild diverticulosis, pending genetic testing for Lynch syndrome [name]) rpt 2 yrs  . COLONOSCOPY  02/2015   TA, diverticulosis, rpt 2 yrs [name])  . COLONOSCOPY WITH PROPOFOL N/A 06/09/2017   TAx1, rpt 2 yrs [name], [name], MD)  . DEXA  12/2009   WNL  . DOBUTAMINE STRESS ECHO  12/2009   no evidence of ischemia  . HERNIA REPAIR  02/04/12  . i & d abdominal wound  02/19/12  . LEFT OOPHORECTOMY  2005  . PARTIAL COLECTOMY  about 2008   for colon cancer  . PARTIAL HYSTERECTOMY  1981   uterine cancer, R ovary remains  . Reexploration of abdominal wound and Allograft placemet  02/26/12  . Removal of infected mesh and abdominal wound vac placement  02/24/12  . sleep study  02/2014   OSA - AHI 55, nadir 81% ([place])    There were no vitals filed for this visit.  Subjective Assessment - 01/28/18 1330    Subjective  10/10 L knee pain currently. The R knee is not too bad, its about 6-7/10. Did not take her arthritis medicine yet. Had a sports needle  used around her knee which helped her R knee pain before, not so much so for the L.     Limitations  Standing;Walking    Currently in Pain?  Yes    Pain Score  10-Worst pain ever L knee    Pain Onset  More than a month ago    Pain Onset  More than a month ago                               PT Education - 01/28/18 1332    Education Details  ther-ex    [employer]) Educated  Patient    Methods  Explanation;Demonstration;Tactile cues;Verbal cues    Comprehension  Returned demonstration;Verbalized understanding          TREATMENT  Pt states that her BP is controlled. Pt adds having an abdominal hernia   Therapeutic Exercise  Nu step seat 8 level 3 x5 minutes to improve LE strength and muscular endurance  STS from chair with airex pad 5x3. Less fatigue today based on previous note.   Seated hip adduction ball and  glute max squeeze 10x3 with 5 second holds  Seated LAQ resisting 2 lbs 10x2  Then 10x5 second each LE  Seated hip ER resisting 2 lbs 10x each LE  Then 10x5 seconds each LE  Standing leg press resisting blue band with bilateral UE assist  R LE 10x2  L LE 10x2   3 inch step ups with bilateral LE assist 4x for L LE. Increased knee pain.   Modified to forward step up onto Air Ex pad. No L knee pain with exercise.   R LE 10x2  L LE 10x2   Improved exercise technique, movement at target joints, use of target muscles after mod verbal, visual, tactile cues.   Improved exercise tolerance with less rest breaks and improved ability to perform more activities compared to previous session. Continued working on improving bilateral hip and quadricep strengthening to promote ability to perform standing tasks.      PT Long Term Goals - 01/20/18 1602      PT LONG TERM GOAL #1   Title  Patient will increase SLS to >/= 10s B in order to improve balance and decrease fall risk.    Baseline  ~1s B    Time  4    Period  Weeks    Status  New    Target Date  02/17/18      PT LONG TERM GOAL #2   Title  Pt will improve hip strength (flex and ER) to 4/5 or greater in order to improve functional mobility and return to prior level of function    Baseline  3+/5    Time  6    Period  Weeks    Status  New    Target Date  03/03/18      PT LONG TERM GOAL #3   Title  Pt will report worst pain in past week <8/10 in L knee and <4/10 in R knee in order to improve mobility and return to prior level of function.     Baseline  10/10 L, 6/10 L    Time  4    Period  Weeks    Status  New    Target Date  02/17/18      PT LONG TERM GOAL #4   Title  Pt will repot worst pain in past week <6/10 L knee  and <2/10 R knee in order to improve mobility and return to prior level of function.    Baseline  10/10 L, 6/10 R    Time  6    Period  Weeks    Status  New    Target Date  03/03/18      PT LONG TERM GOAL #5    Title  Pt will improve FOTO score from 46 to 57 in order to demonstrate improvement in daily function.    Baseline  46    Time  6    Period  Weeks    Target Date  03/03/18      Additional Long Term Goals   Additional Long Term Goals  Yes      PT LONG TERM GOAL #6   Title  Pt will improve 6MWT by 152ft in order to show a clincially significant increase in muscular endurance and cardiovascular endurance.    Baseline  775 ft    Time  6    Period  Weeks    Status  New    Target Date  03/03/18      PT LONG TERM GOAL #7   Title  Pt will improve 10 meter gait speed from .75 m/s to >1 m/s in order to demonstrate decreased fall risk.    Baseline  .75 m/s    Time  6    Period  Weeks    Status  New    Target Date  03/03/18      PT LONG TERM GOAL #8   Title  Pt will improve 5xSTS from 24.19 s to <12 s  in order to demonstrate a clinically significant improvement in functional lower extremity strength and reduce risk of falls..    Baseline  24.19 s    Time  6    Period  Weeks    Status  New    Target Date  03/03/18            Plan - 01/28/18 1324    Clinical Impression Statement  Improved exercise tolerance with less rest breaks and improved ability to perform more activities compared to previous session. Continued working on improving bilateral hip and quadricep strengthening to promote ability to perform standing tasks.     Clinical Impairments Affecting Rehab Potential  chronic pain, multiple comorbidities    PT Frequency  2x / week    PT Duration  6 weeks    PT Next Visit Plan  nu step, higher step up, balance, hip strengthening     Consulted and Agree with Plan of Care  Patient       Patient will benefit from skilled therapeutic intervention in order to improve the following deficits and impairments:  Abnormal gait, Decreased activity tolerance, Decreased endurance, Decreased range of motion, Decreased strength, Hypomobility, Pain, Postural dysfunction, Difficulty walking,  Decreased mobility, Decreased balance  Visit Diagnosis: Chronic pain of left knee  Chronic pain of right knee  Muscle weakness (generalized)  Stiffness of left knee, not elsewhere classified  Stiffness of right knee, not elsewhere classified     Problem List Patient Active Problem List   Diagnosis Date Noted  . Pain of right heel 12/30/2017  . Encounter for chronic pain management 12/18/2017  . Foot pain, bilateral 11/25/2017  . Gallbladder polyp 10/21/2017  . Left shoulder pain 10/21/2017  . Benign neoplasm of ascending colon   . Low back pain radiating to left lower extremity 02/26/2017  . Acquired hypothyroidism 09/10/2016  .  Thrombocytopenia ([place]) 06/15/2016  . Medicare annual wellness visit, subsequent 06/04/2016  . Advanced care planning/counseling discussion 06/04/2016  . External hemorrhoid 04/02/2016  . Slow transit constipation 01/04/2016  . Bipolar I disorder, most recent episode (or current) manic ([place]) 12/03/2015  . NAFLD (nonalcoholic fatty liver disease) 03/28/2015  . Gallbladder calculus without cholecystitis 03/28/2015  . Gout 03/27/2015  . HCV antibody positive 03/27/2015  . History of bladder cancer 03/27/2015  . History of bundle branch block 03/27/2015  . Genetic testing 02/28/2015  . Loss of memory 09/04/2014  . Controlled type 2 diabetes mellitus with diabetic nephropathy ([place]) 06/07/2014  . Hyperlipidemia   . Anemia in CKD (chronic kidney disease) 01/26/2013  . Personal history of colonic adenomas and colon cancer 11/11/2012  . Osteoarthritis of knee 10/02/2012  . OSA on CPAP   . CKD (chronic kidney disease) stage 4, GFR 15-29 ml/min (HCC)   . Severe obesity (BMI 35.0-39.9) with comorbidity ([place])   . HTN (hypertension), benign 04/07/2012  . Hx of colon cancer, stage I 08/05/2011    Joneen Boers PT, DPT   01/28/2018, 2:20 PM  [facility] [address] [place],  [address] Phone: [phone]   Fax:  [phone]  Name: Gina [name] MRN: [id number] Date of Birth: [birth year]

## 2018-01-29 ENCOUNTER — Telehealth: Payer: Self-pay | Admitting: *Deleted

## 2018-01-29 NOTE — Telephone Encounter (Signed)
Copied from [place] [id number]. Topic: Quick Communication - See Telephone Encounter >> Jan 29, 2018 12:17 PM Antonieta Iba C wrote: CRM for notification. See Telephone encounter for: 01/29/18.   Tonya with [facility] [phone] called in because pt contacted them in regards to refilling a Rx for Lodine 200 MG Capsules. Rx was written by a different provider but she was told to contact PCP office for request. Pharmacy states that medication is requiring a PA. Pt would like to have pharmacy to provide alternative medications so that Rx could be sent out.   Per pharmacy some alternatives are: Mobic, Naproxen, IB Profren, Or Celebrex that should not need PA.   Please assist further.

## 2018-02-01 ENCOUNTER — Encounter: Payer: Self-pay | Admitting: *Deleted

## 2018-02-01 ENCOUNTER — Ambulatory Visit: Payer: Medicare Other | Attending: Family Medicine

## 2018-02-01 ENCOUNTER — Telehealth: Payer: Self-pay

## 2018-02-01 DIAGNOSIS — M25661 Stiffness of right knee, not elsewhere classified: Secondary | ICD-10-CM | POA: Diagnosis not present

## 2018-02-01 DIAGNOSIS — M6281 Muscle weakness (generalized): Secondary | ICD-10-CM | POA: Diagnosis not present

## 2018-02-01 DIAGNOSIS — M25562 Pain in left knee: Secondary | ICD-10-CM | POA: Diagnosis not present

## 2018-02-01 DIAGNOSIS — G8929 Other chronic pain: Secondary | ICD-10-CM | POA: Diagnosis not present

## 2018-02-01 DIAGNOSIS — M25561 Pain in right knee: Secondary | ICD-10-CM | POA: Insufficient documentation

## 2018-02-01 DIAGNOSIS — M25662 Stiffness of left knee, not elsewhere classified: Secondary | ICD-10-CM | POA: Insufficient documentation

## 2018-02-01 NOTE — Telephone Encounter (Signed)
Pt calling about refill on lodine 200mg  she would like a call back at [phone]

## 2018-02-01 NOTE — Telephone Encounter (Signed)
I spoke with pt and advised per Dr Synthia Innocent instruction that pt should not take oral anti inflammatory meds and that pt could take vicodin. Pt said has not taken vicodin in a while for fear of "getting hooked on it."  Pt also want to know what to use for fungus between fingers, the nyastatin powder is not helping. [employer]. Pt request cb.

## 2018-02-01 NOTE — Telephone Encounter (Signed)
Returned call to [place] but spoke with Mitzi Hansen at [facility].  Informed him, per Dr. Darnell Level, he does not recommend a refill on requested med at this time and that pt had pain med to use PRN.  Verbalizes understanding and will document and relay message to [place] after lunch.  Spoke with pt's sister, Lelan Pons (on dpr), relaying Dr. Synthia Innocent message. Verbalizes understanding and will relay to pt.

## 2018-02-01 NOTE — Telephone Encounter (Signed)
Copied from [place] [id number]. Topic: General - Other >> Feb 01, 2018  1:51 PM Yvette Rack wrote: Reason for CRM: pt calling stating that the polyethylene glycol powder (GLYCOLAX/MIRALAX) powder doesn't work for the fungus on her hands and would like something else

## 2018-02-01 NOTE — Therapy (Signed)
[facility] [address] [place], [address] Phone: [phone]   Fax:  [phone]  Physical Therapy Treatment  Patient Details  Name: Gina [name] MRN: [id number] Date of Birth: 1951-06-09 No data recorded  Encounter Date: 02/01/2018  PT End of Session - 02/01/18 1412    Visit Number  4    Number of Visits  13    Date for PT Re-Evaluation  03/03/18    PT Start Time  1117    PT Stop Time  1201    PT Time Calculation (min)  44 min    Activity Tolerance  Patient tolerated treatment well    Behavior During Therapy  [facility] for tasks assessed/performed       Past Medical History:  Diagnosis Date  . Anemia in chronic kidney disease   . Bipolar depression [facility])    sees psychiatrist - psych admission 03/2015  . Bladder cancer ([place]) 1990's  . Blood transfusion without reported diagnosis 2009  . Cataract    left  . CKD (chronic kidney disease) stage 3, GFR 30-59 ml/min (HCC)   . Colon cancer ([place]) 1990's  . DJD (degenerative joint disease), lumbar    chronic lower back pain  . Enterocutaneous fistula 04/07/2012   completed PT 06/2012 ([facility])  . Family history of breast cancer   . Family history of colon cancer   . Family history of stomach cancer   . GERD (gastroesophageal reflux disease)   . History of bladder cancer 1997  . History of colon cancer    s/p surgery  . History of uterine cancer    s/p hysterectomy  . Hyperlipidemia   . Hypertension   . IDA (iron deficiency anemia) 01/2013   thought due to h/o polyps  . Obesity (BMI 30-39.9)   . OSA on CPAP    6cm H2O  . Personal history of colonic adenomas and colon cancer 11/11/2012  . Positive hepatitis C antibody test 09/2014   but negative confirmatory testing  . RBBB   . Uncontrolled type 2 diabetes mellitus with nephropathy ([place]) 06/07/2014   completed DSME 02/2016    Past Surgical History:  Procedure Laterality Date  . BREAST  BIOPSY Right 01/2014   fibroadenoma  . COLONOSCOPY  07/2009  . COLONOSCOPY  11/2012   2 tubular adenomas, mild diverticulosis, pending genetic testing for Lynch syndrome [name]) rpt 2 yrs  . COLONOSCOPY  02/2015   TA, diverticulosis, rpt 2 yrs [name])  . COLONOSCOPY WITH PROPOFOL N/A 06/09/2017   TAx1, rpt 2 yrs [name], [name], MD)  . DEXA  12/2009   WNL  . DOBUTAMINE STRESS ECHO  12/2009   no evidence of ischemia  . HERNIA REPAIR  02/04/12  . i & d abdominal wound  02/19/12  . LEFT OOPHORECTOMY  2005  . PARTIAL COLECTOMY  about 2008   for colon cancer  . PARTIAL HYSTERECTOMY  1981   uterine cancer, R ovary remains  . Reexploration of abdominal wound and Allograft placemet  02/26/12  . Removal of infected mesh and abdominal wound vac placement  02/24/12  . sleep study  02/2014   OSA - AHI 55, nadir 81% ([place])    There were no vitals filed for this visit.  Subjective Assessment - 02/01/18 1408    Subjective  Pt reports that L knee is currently 10/10 pain and that the R knee "feels fine" but rated the pain 6/10. Pt also reports that she  has not been taking her arthiritis medication becuase she is wiaitng for a new prescription that will be less expensive. Additionally, pt reports that she feels that she can "walk a little better now".    Limitations  Standing;Walking    Patient Stated Goals  decrease pain, go on walks with no pain    Currently in Pain?  Yes    Pain Score  10-Worst pain ever    Pain Location  Knee    Pain Orientation  Left    Pain Descriptors / Indicators  Aching    Pain Type  Chronic pain    Pain Onset  More than a month ago    Pain Frequency  Constant    Multiple Pain Sites  Yes    Pain Score  6    Pain Location  Foot    Pain Orientation  Right    Pain Descriptors / Indicators  Aching    Pain Type  Chronic pain    Pain Onset  More than a month ago    Pain Frequency  Constant         TREATMENT Therapeutic Exercises Nu step level 3 (seat level 8) LE  only, x5 minutes Sit to stands 10x2 from green chair with airex  Mini squats x15 (increased L knee pain) Standing leg press with BTB x20 B  TKE x20 (significant verbal and tactile cues) TRX squats x10  Step overs (quad cane with balance stone) fwd leading with LLE, bkwd leading with LLE 10x2  Patient improved tolerance to therapeutic exercises and required fewer rest breaks overall. Pt fatigued at end of session.     PT Education - 02/01/18 1411    Education Details  Patient educated on proper form and technique for therapeutic exercises.    Person(s) Educated  Patient    Methods  Demonstration;Explanation;Tactile cues;Verbal cues    Comprehension  Verbalized understanding;Returned demonstration;Tactile cues required;Verbal cues required          PT Long Term Goals - 01/20/18 1602      PT LONG TERM GOAL #1   Title  Patient will increase SLS to >/= 10s B in order to improve balance and decrease fall risk.    Baseline  ~1s B    Time  4    Period  Weeks    Status  New    Target Date  02/17/18      PT LONG TERM GOAL #2   Title  Pt will improve hip strength (flex and ER) to 4/5 or greater in order to improve functional mobility and return to prior level of function    Baseline  3+/5    Time  6    Period  Weeks    Status  New    Target Date  03/03/18      PT LONG TERM GOAL #3   Title  Pt will report worst pain in past week <8/10 in L knee and <4/10 in R knee in order to improve mobility and return to prior level of function.     Baseline  10/10 L, 6/10 L    Time  4    Period  Weeks    Status  New    Target Date  02/17/18      PT LONG TERM GOAL #4   Title  Pt will repot worst pain in past week <6/10 L knee and <2/10 R knee in order to improve mobility and return to prior level of function.  Baseline  10/10 L, 6/10 R    Time  6    Period  Weeks    Status  New    Target Date  03/03/18      PT LONG TERM GOAL #5   Title  Pt will improve FOTO score from 46 to 57 in  order to demonstrate improvement in daily function.    Baseline  46    Time  6    Period  Weeks    Target Date  03/03/18      Additional Long Term Goals   Additional Long Term Goals  Yes      PT LONG TERM GOAL #6   Title  Pt will improve 6MWT by 147ft in order to show a clincially significant increase in muscular endurance and cardiovascular endurance.    Baseline  775 ft    Time  6    Period  Weeks    Status  New    Target Date  03/03/18      PT LONG TERM GOAL #7   Title  Pt will improve 10 meter gait speed from .75 m/s to >1 m/s in order to demonstrate decreased fall risk.    Baseline  .75 m/s    Time  6    Period  Weeks    Status  New    Target Date  03/03/18      PT LONG TERM GOAL #8   Title  Pt will improve 5xSTS from 24.19 s to <12 s  in order to demonstrate a clinically significant improvement in functional lower extremity strength and reduce risk of falls..    Baseline  24.19 s    Time  6    Period  Weeks    Status  New    Target Date  03/03/18            Plan - 02/01/18 1412    Clinical Impression Statement  Patient continues to demonstrate decreased knee flexion in L knee throughout therapeutic exercises. Pt also weight shifts to the R to offload LLE and requires max verbal to correct form. During TKE exercse, patient required significant tactile and verbal cueing to complete exercise correctly and demonstrated a tendency to create movement at the hip instead of at the knee. Pt improved tolreance to therapeutic exercises this session and was able to complete more repititions with fewer rest breaks. Pt will benefit from continued skilled PT in order to return to prior level of function.      Rehab Potential  Fair    Clinical Impairments Affecting Rehab Potential  chronic pain, multiple comorbidities    PT Frequency  2x / week    PT Duration  6 weeks    PT Treatment/Interventions  ADLs/Self Care Home Management;Cryotherapy;Moist Heat;Electrical Stimulation;Stair  training;Gait training;Therapeutic activities;Therapeutic exercise;Neuromuscular re-education;Balance training;Patient/family education;Manual techniques;Dry needling;Passive range of motion    PT Next Visit Plan  higher step ups, hip strengthening, balance, step overs with decr UE uspport    Consulted and Agree with Plan of Care  Patient       Patient will benefit from skilled therapeutic intervention in order to improve the following deficits and impairments:  Abnormal gait, Decreased activity tolerance, Decreased endurance, Decreased range of motion, Decreased strength, Hypomobility, Pain, Postural dysfunction, Difficulty walking, Decreased mobility, Decreased balance  Visit Diagnosis: Chronic pain of left knee  Chronic pain of right knee  Muscle weakness (generalized)  Stiffness of left knee, not elsewhere classified  Stiffness of right  knee, not elsewhere classified     Problem List Patient Active Problem List   Diagnosis Date Noted  . Pain of right heel 12/30/2017  . Encounter for chronic pain management 12/18/2017  . Foot pain, bilateral 11/25/2017  . Gallbladder polyp 10/21/2017  . Left shoulder pain 10/21/2017  . Benign neoplasm of ascending colon   . Low back pain radiating to left lower extremity 02/26/2017  . Acquired hypothyroidism 09/10/2016  . Thrombocytopenia ([place]) 06/15/2016  . Medicare annual wellness visit, subsequent 06/04/2016  . Advanced care planning/counseling discussion 06/04/2016  . External hemorrhoid 04/02/2016  . Slow transit constipation 01/04/2016  . Bipolar I disorder, most recent episode (or current) manic ([place]) 12/03/2015  . NAFLD (nonalcoholic fatty liver disease) 03/28/2015  . Gallbladder calculus without cholecystitis 03/28/2015  . Gout 03/27/2015  . HCV antibody positive 03/27/2015  . History of bladder cancer 03/27/2015  . History of bundle branch block 03/27/2015  . Genetic testing 02/28/2015  . Loss of memory 09/04/2014  .  Controlled type 2 diabetes mellitus with diabetic nephropathy ([place]) 06/07/2014  . Hyperlipidemia   . Anemia in CKD (chronic kidney disease) 01/26/2013  . Personal history of colonic adenomas and colon cancer 11/11/2012  . Osteoarthritis of knee 10/02/2012  . OSA on CPAP   . CKD (chronic kidney disease) stage 4, GFR 15-29 ml/min (HCC)   . Severe obesity (BMI 35.0-39.9) with comorbidity ([place])   . HTN (hypertension), benign 04/07/2012  . Hx of colon cancer, stage I 08/05/2011    Georg Ruddle, SPT 02/01/2018, 2:18 PM  [facility] [address] [place], [address] Phone: [phone]   Fax:  [phone]  Name: Winfred Redel MRN: [id number] Date of Birth: 1951-01-01

## 2018-02-01 NOTE — Telephone Encounter (Addendum)
plz call - don't recommend oral anti inflammatories at this time due to chronic kidney disease Pt has vicodin to use as needed for pain.

## 2018-02-01 NOTE — Telephone Encounter (Signed)
I spoke with Mitzi Hansen at [facility] and he is not aware of use for miralax and hand fungus; see 01/29/18 phone note; I spoke with pt and I think this is the nystatin powder on hx med list that is not helping the hand fungus and pt requesting different med for hand fungus.

## 2018-02-02 MED ORDER — CLOTRIMAZOLE 1 % EX CREA: 1.0000 "application " | TOPICAL_CREAM | Freq: Two times a day (BID) | CUTANEOUS | 0 refills | Status: DC

## 2018-02-02 NOTE — Addendum Note (Signed)
Addended by: Ria Bush on: 02/02/2018 07:52 AM   Modules accepted: Orders

## 2018-02-02 NOTE — Telephone Encounter (Signed)
Already responded on other phone note.

## 2018-02-02 NOTE — Telephone Encounter (Addendum)
Is this for area between toes of feet? If so, plz notify I've sent in different antifungal cream to [facility] to use (clotrimazole). If no better, come in for [place] to evaluate.

## 2018-02-02 NOTE — Telephone Encounter (Signed)
If no better, schedule office visit to review.

## 2018-02-02 NOTE — Telephone Encounter (Signed)
Spoke with pt's sister, Lelan Pons (on dpr), relaying Dr. Synthia Innocent message. Verbalizes understanding and will relay to pt. But says the issue is on pt's hand between her fingers. Says pt has already picked up the rx.

## 2018-02-03 NOTE — Telephone Encounter (Signed)
Spoke with pt asking if cream was helping. Says she just started today but will call back Friday with an update.

## 2018-02-05 ENCOUNTER — Telehealth: Payer: Self-pay

## 2018-02-05 NOTE — Telephone Encounter (Signed)
Copied from [place] [id number]. Topic: General - Other >> Feb 01, 2018  1:51 PM Yvette Rack wrote: Reason for CRM: pt calling stating that the polyethylene glycol powder (GLYCOLAX/MIRALAX) powder doesn't work for the fungus on her hands and would like something else  >> Feb 05, 2018  9:53 AM Percell Belt A wrote: Pt called back in today and stated that the cream is not working or helping her hands.  She would like to know what else she can do?    [place] number -[phone]

## 2018-02-05 NOTE — Telephone Encounter (Signed)
See 01/29/18 phone note. Pt needs to make appt. I spoke with pt and she scheduled 30' appt to see Dr Darnell Level on 02/12/18 at 2 pm. This is first available appt that met pts needs for transportation. FYI ot Dr Darnell Level.

## 2018-02-09 ENCOUNTER — Other Ambulatory Visit: Payer: Self-pay | Admitting: Family Medicine

## 2018-02-10 ENCOUNTER — Encounter: Payer: Self-pay | Admitting: Oncology

## 2018-02-10 ENCOUNTER — Inpatient Hospital Stay: Payer: Medicare Other | Attending: Oncology

## 2018-02-10 ENCOUNTER — Inpatient Hospital Stay (HOSPITAL_BASED_OUTPATIENT_CLINIC_OR_DEPARTMENT_OTHER): Payer: Medicare Other | Admitting: Oncology

## 2018-02-10 ENCOUNTER — Other Ambulatory Visit: Payer: Self-pay

## 2018-02-10 VITALS — BP 123/79 | HR 65 | Temp 96.9°F | Resp 18 | Wt 200.3 lb

## 2018-02-10 DIAGNOSIS — Z8541 Personal history of malignant neoplasm of cervix uteri: Secondary | ICD-10-CM

## 2018-02-10 DIAGNOSIS — Z800 Family history of malignant neoplasm of digestive organs: Secondary | ICD-10-CM | POA: Diagnosis not present

## 2018-02-10 DIAGNOSIS — E785 Hyperlipidemia, unspecified: Secondary | ICD-10-CM | POA: Diagnosis not present

## 2018-02-10 DIAGNOSIS — D631 Anemia in chronic kidney disease: Secondary | ICD-10-CM | POA: Insufficient documentation

## 2018-02-10 DIAGNOSIS — Z85038 Personal history of other malignant neoplasm of large intestine: Secondary | ICD-10-CM | POA: Diagnosis not present

## 2018-02-10 DIAGNOSIS — Z7984 Long term (current) use of oral hypoglycemic drugs: Secondary | ICD-10-CM | POA: Diagnosis not present

## 2018-02-10 DIAGNOSIS — K824 Cholesterolosis of gallbladder: Secondary | ICD-10-CM

## 2018-02-10 DIAGNOSIS — Z8551 Personal history of malignant neoplasm of bladder: Secondary | ICD-10-CM | POA: Insufficient documentation

## 2018-02-10 DIAGNOSIS — K219 Gastro-esophageal reflux disease without esophagitis: Secondary | ICD-10-CM | POA: Diagnosis not present

## 2018-02-10 DIAGNOSIS — I129 Hypertensive chronic kidney disease with stage 1 through stage 4 chronic kidney disease, or unspecified chronic kidney disease: Secondary | ICD-10-CM

## 2018-02-10 DIAGNOSIS — N183 Chronic kidney disease, stage 3 (moderate): Secondary | ICD-10-CM | POA: Insufficient documentation

## 2018-02-10 DIAGNOSIS — F329 Major depressive disorder, single episode, unspecified: Secondary | ICD-10-CM

## 2018-02-10 DIAGNOSIS — Z79899 Other long term (current) drug therapy: Secondary | ICD-10-CM

## 2018-02-10 DIAGNOSIS — D696 Thrombocytopenia, unspecified: Secondary | ICD-10-CM

## 2018-02-10 DIAGNOSIS — D649 Anemia, unspecified: Secondary | ICD-10-CM

## 2018-02-10 LAB — COMPREHENSIVE METABOLIC PANEL
ALBUMIN: 4.1 g/dL (ref 3.5–5.0)
ALK PHOS: 72 U/L (ref 38–126)
ALT: 18 U/L (ref 0–44)
AST: 22 U/L (ref 15–41)
Anion gap: 10 (ref 5–15)
BILIRUBIN TOTAL: 0.5 mg/dL (ref 0.3–1.2)
BUN: 38 mg/dL — ABNORMAL HIGH (ref 8–23)
CO2: 22 mmol/L (ref 22–32)
Calcium: 9.6 mg/dL (ref 8.9–10.3)
Chloride: 105 mmol/L (ref 98–111)
Creatinine, Ser: 1.85 mg/dL — ABNORMAL HIGH (ref 0.44–1.00)
GFR calc Af Amer: 31 mL/min — ABNORMAL LOW (ref >60)
GFR calc non Af Amer: 27 mL/min — ABNORMAL LOW (ref >60)
GLUCOSE: 239 mg/dL — AB (ref 70–99)
Potassium: 5.1 mmol/L (ref 3.5–5.1)
SODIUM: 137 mmol/L (ref 135–145)
TOTAL PROTEIN: 7.4 g/dL (ref 6.5–8.1)

## 2018-02-10 LAB — CBC WITH DIFFERENTIAL/PLATELET
BASOS ABS: 0 10*3/uL (ref 0–0.1)
Basophils Relative: 0 %
EOS PCT: 1 %
Eosinophils Absolute: 0.1 10*3/uL (ref 0–0.7)
HEMATOCRIT: 35.1 % (ref 35.0–47.0)
Hemoglobin: 12.2 g/dL (ref 12.0–16.0)
LYMPHS ABS: 1.6 10*3/uL (ref 1.0–3.6)
LYMPHS PCT: 30 %
MCH: 33.8 pg (ref 26.0–34.0)
MCHC: 34.7 g/dL (ref 32.0–36.0)
MCV: 97.5 fL (ref 80.0–100.0)
MONO ABS: 0.4 10*3/uL (ref 0.2–0.9)
MONOS PCT: 8 %
NEUTROS ABS: 3.3 10*3/uL (ref 1.4–6.5)
Neutrophils Relative %: 61 %
PLATELETS: 119 10*3/uL — AB (ref 150–440)
RBC: 3.6 MIL/uL — ABNORMAL LOW (ref 3.80–5.20)
RDW: 13.9 % (ref 11.5–14.5)
WBC: 5.5 10*3/uL (ref 3.6–11.0)

## 2018-02-10 LAB — FOLATE: Folate: 34 ng/mL (ref >5.9)

## 2018-02-10 NOTE — Progress Notes (Signed)
Patient here for follow up. No concerns voiced.  °

## 2018-02-10 NOTE — Progress Notes (Addendum)
Hematology/Oncology Follow up  [facility] [phone] [phone]) [phone]   Patient Care Team: Ria Bush, MD as PCP - General (Family Medicine)  REFERRING PROVIDER: Ria Bush, MD REASON FOR VISIT Follow up for treatment of thrombocytopenia.    HISTORY OF PRESENTING ILLNESS:  Gina [name] is a  67 y.o.  female with PMH listed below who was referred to me for evaluation of thrombocytopenia. Patient takes Depakote for depression for about 5 years. Denies any active bleeding events. Denies any fever, chills, weight loss, fatigue.  She reports occasional bruising on her upper extremities.  Significant colon cancer family history and also personal history of colon cancer which was resected in 2014. Her most recent colonoscopy was done by Dr.Wohl and showed One 3 mm polyp in the ascending colon, removed with a cold biopsy forceps.  Pathology showed TUBULAR ADENOMA. Patient also has a personal history of uterine cancer in the bladder cancer. Patient is not aware if she has ever had genetic testing. She tells me that her depression has been going on and off. She lives by herself. She is single, no children.  INTERVAL HISTORY Gina [name] is a 67 y.o. female who has above history reviewed by me today presents for follow up visit for management of thrombocytopenia. She continues to be on Depakote for mood disorder.  Denies any bleeding events.  Reports doing well.    Review of Systems  Constitutional: Negative for chills, fever, malaise/fatigue and weight loss.  HENT: Negative for congestion, ear discharge, ear pain, hearing loss, nosebleeds, sinus pain and sore throat.   Eyes: Negative for double vision, photophobia, pain, discharge and redness.  Respiratory: Negative for cough, hemoptysis, sputum production, shortness of breath and wheezing.   Cardiovascular: Negative for chest pain, palpitations, orthopnea, claudication  and leg swelling.  Gastrointestinal: Negative for abdominal pain, blood in stool, constipation, diarrhea, heartburn, melena, nausea and vomiting.  Genitourinary: Negative for dysuria, flank pain, frequency and hematuria.  Musculoskeletal: Negative for back pain, myalgias and neck pain.  Skin: Negative for itching and rash.  Neurological: Negative for dizziness, tingling, tremors, focal weakness, weakness and headaches.  Endo/Heme/Allergies: Negative for environmental allergies. Does not bruise/bleed easily.  Psychiatric/Behavioral: Positive for depression. Negative for hallucinations. The patient is not nervous/anxious.     MEDICAL HISTORY:  Past Medical History:  Diagnosis Date  . Anemia in chronic kidney disease   . Bipolar depression [facility])    sees psychiatrist - psych admission 03/2015  . Bladder cancer ([place]) 1990's  . Blood transfusion without reported diagnosis 2009  . Cataract    left  . CKD (chronic kidney disease) stage 3, GFR 30-59 ml/min (HCC)   . Colon cancer ([place]) 1990's  . DJD (degenerative joint disease), lumbar    chronic lower back pain  . Enterocutaneous fistula 04/07/2012   completed PT 06/2012 ([facility])  . Family history of breast cancer   . Family history of colon cancer   . Family history of stomach cancer   . GERD (gastroesophageal reflux disease)   . History of bladder cancer 1997  . History of colon cancer    s/p surgery  . History of uterine cancer    s/p hysterectomy  . Hyperlipidemia   . Hypertension   . IDA (iron deficiency anemia) 01/2013   thought due to h/o polyps  . Obesity (BMI 30-39.9)   . OSA on CPAP    6cm H2O  . Personal history of colonic adenomas and colon cancer 11/11/2012  .  Positive hepatitis C antibody test 09/2014   but negative confirmatory testing  . RBBB   . Uncontrolled type 2 diabetes mellitus with nephropathy ([place]) 06/07/2014   completed DSME 02/2016    SURGICAL HISTORY: Past Surgical History:  Procedure Laterality Date    . BREAST BIOPSY Right 01/2014   fibroadenoma  . COLONOSCOPY  07/2009  . COLONOSCOPY  11/2012   2 tubular adenomas, mild diverticulosis, pending genetic testing for Lynch syndrome [name]) rpt 2 yrs  . COLONOSCOPY  02/2015   TA, diverticulosis, rpt 2 yrs [name])  . COLONOSCOPY WITH PROPOFOL N/A 06/09/2017   TAx1, rpt 2 yrs [name], [name], MD)  . DEXA  12/2009   WNL  . DOBUTAMINE STRESS ECHO  12/2009   no evidence of ischemia  . HERNIA REPAIR  02/04/12  . i & d abdominal wound  02/19/12  . LEFT OOPHORECTOMY  2005  . PARTIAL COLECTOMY  about 2008   for colon cancer  . PARTIAL HYSTERECTOMY  1981   uterine cancer, R ovary remains  . Reexploration of abdominal wound and Allograft placemet  02/26/12  . Removal of infected mesh and abdominal wound vac placement  02/24/12  . sleep study  02/2014   OSA - AHI 55, nadir 81% [name])    SOCIAL HISTORY: Social History   Socioeconomic History  . Marital status: Single    Spouse name: Not on file  . Number of children: 0  . Years of education: Not on file  . Highest education level: Not on file  Occupational History  . Not on file  Social Needs  . Financial resource strain: Not on file  . Food insecurity:    Worry: Not on file    Inability: Not on file  . Transportation needs:    Medical: Not on file    Non-medical: Not on file  Tobacco Use  . Smoking status: Never Smoker  . Smokeless tobacco: Never Used  Substance and Sexual Activity  . Alcohol use: No    Alcohol/week: 0.0 oz  . Drug use: No  . Sexual activity: Never  Lifestyle  . Physical activity:    Days per week: Not on file    Minutes per session: Not on file  . Stress: Not on file  Relationships  . Social connections:    Talks on phone: Not on file    Gets together: Not on file    Attends religious service: Not on file    Active member of club or organization: Not on file    Attends meetings of clubs or organizations: Not on file    Relationship status: Not on file   . Intimate partner violence:    Fear of current or ex partner: Not on file    Emotionally abused: Not on file    Physically abused: Not on file    Forced sexual activity: Not on file  Other Topics Concern  . Not on file  Social History Narrative   Lives with sister, no pets   Occupation: disabled, for bipolar and arthritis   Edu: GED   Activity: take walks   Diet: good water, vegetables daily   Religion: [place], Dr. Jimmye Norman (ph [phone])    FAMILY HISTORY: Family History  Problem Relation Age of Onset  . Colon cancer Mother 39  . Stomach cancer Mother        dx in her 48s?  . CAD Father  MI; deceased 18  . Mental illness Sister        anxiety/depression  . Thyroid disease Sister   . Breast cancer Maternal Grandmother        age at dx unknown  . Diabetes Brother   . Diabetes Brother   . Diabetes Other        aunts and uncles both sides  . Arthritis Other        strong fmhx  . Colon cancer Other 54       maternal half-sister; deceased    ALLERGIES:  is allergic to risperidone and related; penicillins; ivp dye [iodinated diagnostic agents]; tetanus toxoids; and zetia [ezetimibe].  MEDICATIONS:  Current Outpatient Medications  Medication Sig Dispense Refill  . ACCU-CHEK AVIVA PLUS test strip 1 each by Other route daily. E11.21 100 each 3  . bisacodyl (DULCOLAX) 10 MG suppository Place 1 suppository (10 mg total) rectally daily as needed for moderate constipation. 12 suppository 0  . clotrimazole (LOTRIMIN) 1 % cream Apply 1 application topically 2 (two) times daily. To affected area on toes 30 g 0  . colchicine 0.6 MG tablet Take 0.6 mg by mouth daily. Take 2 tablets on first day, then 1 tablet daily for 10 day as needed for gout    . diclofenac sodium (VOLTAREN) 1 % GEL Apply 4 g topically 4 (four) times daily. To knees 5 Tube 5  . divalproex (DEPAKOTE) 500 MG DR tablet Take 1 tablet (500 mg total) by mouth 2 (two)  times daily with a meal. 60 tablet 3  . docusate sodium (COLACE) 100 MG capsule Take 2 capsules (200 mg total) by mouth 2 (two) times daily. 60 capsule 1  . HYDROcodone-acetaminophen (NORCO/VICODIN) 5-325 MG tablet Take 1 tablet by mouth 3 (three) times daily as needed for moderate pain. 15 tablet 0  . lamoTRIgine (LAMICTAL) 25 MG tablet Take 2 tablets (50 mg total) by mouth 2 (two) times daily. 120 tablet 1  . levothyroxine (SYNTHROID, LEVOTHROID) 50 MCG tablet Take 1 tablet (50 mcg total) by mouth daily before breakfast. 30 tablet 11  . losartan (COZAAR) 50 MG tablet Take 1 tablet (50 mg total) by mouth daily. 30 tablet 11  . MELATONIN PO Take 1 tablet by mouth at bedtime.    . metFORMIN (GLUCOPHAGE) 500 MG tablet TAKE (1) TABLET BY MOUTH DAILY WITH BREAKFAST. 30 tablet 0  . metoprolol tartrate (LOPRESSOR) 25 MG tablet Take 0.5 tablets (12.5 mg total) by mouth 2 (two) times daily. 60 tablet 1  . paliperidone (INVEGA SUSTENNA) 234 MG/1.5ML SUSP injection Inject 234 mg into the muscle every 30 (thirty) days.    . polyethylene glycol powder (GLYCOLAX/MIRALAX) powder TAKE 17 GRAMS BY MOUTH TWICE DAILY AS NEEDED FOR MODERATE CONSTIPATION. 527 g 3  . simvastatin (ZOCOR) 20 MG tablet Take 1 tablet (20 mg total) by mouth at bedtime. 30 tablet 11  . QUEtiapine (SEROQUEL) 300 MG tablet Take 1 tablet (300 mg total) by mouth at bedtime. (Patient not taking: Reported on 02/10/2018) 30 tablet 1  . senna (SENOKOT) 8.6 MG TABS tablet Take 1 tablet (8.6 mg total) by mouth at bedtime as needed for mild constipation. (Patient not taking: Reported on 01/20/2018) 120 each 0   No current facility-administered medications for this visit.      PHYSICAL EXAMINATION: ECOG PERFORMANCE STATUS: 0 - Asymptomatic Vitals:   02/10/18 1308  BP: 123/79  Pulse: 65  Resp: 18  Temp: (!) 96.9 F (36.1 C)  SpO2: 98%  Filed Weights   02/10/18 1308  Weight: 200 lb 4.8 oz (90.9 kg)    Physical Exam  Constitutional: She is  oriented to person, place, and time and well-developed, well-nourished, and in no distress. No distress.  HENT:  Head: Normocephalic and atraumatic.  Nose: Nose normal.  Mouth/Throat: Oropharynx is clear and moist. No oropharyngeal exudate.  Eyes: Pupils are equal, round, and reactive to light. Conjunctivae and EOM are normal. Left eye exhibits no discharge. No scleral icterus.  Neck: Normal range of motion. Neck supple. No JVD present.  Cardiovascular: Normal rate, regular rhythm and normal heart sounds. Exam reveals no friction rub.  No murmur heard. Pulmonary/Chest: Effort normal and breath sounds normal. No respiratory distress. She has no wheezes. She has no rales. She exhibits no tenderness.  Abdominal: Soft. Bowel sounds are normal. She exhibits no distension and no mass. There is no tenderness. There is no rebound.  Musculoskeletal: Normal range of motion. She exhibits no edema or tenderness.  Lymphadenopathy:    She has no cervical adenopathy.  Neurological: She is alert and oriented to person, place, and time. No cranial nerve deficit. She exhibits normal muscle tone. Coordination normal.  Skin: Skin is warm and dry. No rash noted. She is not diaphoretic. No erythema.  Psychiatric: Affect and judgment normal.     LABORATORY DATA:  I have reviewed the data as listed Lab Results  Component Value Date   WBC 5.5 02/10/2018   HGB 12.2 02/10/2018   HCT 35.1 02/10/2018   MCV 97.5 02/10/2018   PLT 119 (L) 02/10/2018   Recent Labs    11/13/17 1524 11/28/17 2351 12/05/17 12/18/17 0836 02/10/18 1226  NA 138 138  --   --  137  K 5.7* 4.9 5.6* 5.0 5.1  CL 103 105  --   --  105  CO2 27 26  --   --  22  GLUCOSE 71 125*  --   --  239*  BUN 37* 32* 25*  --  38*  CREATININE 1.95* 1.73* 1.6*  --  1.85*  CALCIUM 9.3 8.8*  --   --  9.6  GFRNONAA 25* 29*  --   --  27*  GFRAA 29* 34*  --   --  31*  PROT 7.6 7.0  --   --  7.4  ALBUMIN 4.4 3.9  --   --  4.1  AST 28 24  --   --  22    ALT 26 23  --   --  18  ALKPHOS 71 72  --   --  72  BILITOT 0.6 0.1*  --   --  0.5       ASSESSMENT & PLAN:  1. Thrombocytopenia ([place])   2. Anemia, unspecified type    Labs reviewed and discussed with patient that her thrombocytopenia most likely secondary to Depakote.  Other possibilities include primary bone marrow disorder.   Recommend patient make a follow-up appointment with her psychiatrist and discuss if Depakote dose can be reduced or switch to another medication to see if platelet count resolves.   Hold bone marrow biopsy for now until drug-induced thrombocytopenia is ruled out.  #Family history and personal history of cancer.  Recommended genetic testing.  Patient is in agreement.  Referred to  genetic counselor, had genetic testing in 2016. She declined additional testing.  # Gallbladder polyp, repeat ultrasound in a year.  Follow-up with primary care physician.    All questions were answered. The patient  knows to call the clinic with any problems questions or concerns. Total face to face encounter time for this patient visit was 25 min. >50% of the time was  spent in counseling and coordination of care.  Return of visit: 2 months. Earlie Server, MD, PhD Hematology Oncology [facility] at [facility] Pager- [phone] 02/10/2018

## 2018-02-11 ENCOUNTER — Other Ambulatory Visit: Payer: Self-pay | Admitting: Family Medicine

## 2018-02-11 ENCOUNTER — Ambulatory Visit
Admission: RE | Admit: 2018-02-11 | Discharge: 2018-02-11 | Disposition: A | Discharge status: Home / Self Care | Payer: Medicare Other | Source: Ambulatory Visit | Attending: Family Medicine | Admitting: Family Medicine

## 2018-02-11 DIAGNOSIS — Z1231 Encounter for screening mammogram for malignant neoplasm of breast: Secondary | ICD-10-CM

## 2018-02-11 LAB — HM MAMMOGRAPHY

## 2018-02-12 ENCOUNTER — Ambulatory Visit: Payer: Self-pay | Admitting: Family Medicine

## 2018-02-12 ENCOUNTER — Encounter: Payer: Self-pay | Admitting: Family Medicine

## 2018-02-16 ENCOUNTER — Ambulatory Visit: Payer: Medicare Other

## 2018-02-16 DIAGNOSIS — M25562 Pain in left knee: Secondary | ICD-10-CM | POA: Diagnosis not present

## 2018-02-16 DIAGNOSIS — M6281 Muscle weakness (generalized): Secondary | ICD-10-CM

## 2018-02-16 DIAGNOSIS — M25662 Stiffness of left knee, not elsewhere classified: Secondary | ICD-10-CM | POA: Diagnosis not present

## 2018-02-16 DIAGNOSIS — M25561 Pain in right knee: Secondary | ICD-10-CM

## 2018-02-16 DIAGNOSIS — M25661 Stiffness of right knee, not elsewhere classified: Secondary | ICD-10-CM | POA: Diagnosis not present

## 2018-02-16 DIAGNOSIS — G8929 Other chronic pain: Secondary | ICD-10-CM

## 2018-02-17 NOTE — Therapy (Signed)
[facility] [address] [place], [address] Phone: [phone]   Fax:  [phone]  Physical Therapy Treatment  Patient Details  Name: Gina [name] MRN: [id number] Date of Birth: [birth year] No data recorded  Encounter Date: 02/16/2018  PT End of Session - 02/17/18 0948    Visit Number  5    Number of Visits  13    Date for PT Re-Evaluation  03/03/18    PT Start Time  4235    PT Stop Time  1430    PT Time Calculation (min)  45 min    Activity Tolerance  Patient tolerated treatment well    Behavior During Therapy  [facility] for tasks assessed/performed       Past Medical History:  Diagnosis Date  . Anemia in chronic kidney disease   . Bipolar depression [facility])    sees psychiatrist - psych admission 03/2015  . Bladder cancer ([place]) 1990's  . Blood transfusion without reported diagnosis 2009  . Cataract    left  . CKD (chronic kidney disease) stage 3, GFR 30-59 ml/min (HCC)   . Colon cancer ([place]) 1990's  . DJD (degenerative joint disease), lumbar    chronic lower back pain  . Enterocutaneous fistula 04/07/2012   completed PT 06/2012 ([facility])  . Family history of breast cancer   . Family history of colon cancer   . Family history of stomach cancer   . GERD (gastroesophageal reflux disease)   . History of bladder cancer 1997  . History of colon cancer    s/p surgery  . History of uterine cancer    s/p hysterectomy  . Hyperlipidemia   . Hypertension   . IDA (iron deficiency anemia) 01/2013   thought due to h/o polyps  . Obesity (BMI 30-39.9)   . OSA on CPAP    6cm H2O  . Personal history of colonic adenomas and colon cancer 11/11/2012  . Positive hepatitis C antibody test 09/2014   but negative confirmatory testing  . RBBB   . Uncontrolled type 2 diabetes mellitus with nephropathy ([place]) 06/07/2014   completed DSME 02/2016    Past Surgical History:  Procedure Laterality Date  . BREAST  BIOPSY Right 01/2014   fibroadenoma  . COLONOSCOPY  07/2009  . COLONOSCOPY  11/2012   2 tubular adenomas, mild diverticulosis, pending genetic testing for Lynch syndrome [name]) rpt 2 yrs  . COLONOSCOPY  02/2015   TA, diverticulosis, rpt 2 yrs [name])  . COLONOSCOPY WITH PROPOFOL N/A 06/09/2017   TAx1, rpt 2 yrs [name], [name], MD)  . DEXA  12/2009   WNL  . DOBUTAMINE STRESS ECHO  12/2009   no evidence of ischemia  . HERNIA REPAIR  02/04/12  . i & d abdominal wound  02/19/12  . LEFT OOPHORECTOMY  2005  . PARTIAL COLECTOMY  about 2008   for colon cancer  . PARTIAL HYSTERECTOMY  1981   uterine cancer, R ovary remains  . Reexploration of abdominal wound and Allograft placemet  02/26/12  . Removal of infected mesh and abdominal wound vac placement  02/24/12  . sleep study  02/2014   OSA - AHI 55, nadir 81% ([place])    There were no vitals filed for this visit.  Subjective Assessment - 02/17/18 0935    Subjective  Pt reports that MD "took away" arthritis medicine because it was too expensive and her insurance would no longer cover it. Pt states that her knees  still hurt and do not feel much different.     Patient is accompained by:  Family member    Limitations  Walking;Standing    How long can you stand comfortably?  30 minutes    How long can you walk comfortably?  10 minutes    Patient Stated Goals  decrease pain, go on walks with no pain    Currently in Pain?  Yes    Pain Score  10-Worst pain ever    Pain Location  Knee    Pain Orientation  Left    Pain Descriptors / Indicators  Aching    Pain Type  Chronic pain    Pain Onset  More than a month ago    Pain Frequency  Constant    Multiple Pain Sites  Yes    Pain Score  8    Pain Location  Knee    Pain Orientation  Left    Pain Descriptors / Indicators  Aching    Pain Type  Chronic pain    Pain Onset  More than a month ago    Pain Frequency  Constant         TREATMENT Therapeutic Exercises   Nu step level 3 (seat level  8) LE only, x8 minutes (improved tolerance, able to maintain 50-100 spm) Sit to stands 10x2 from green chair with airex and pillow Mini squats x15 (increased L knee pain, VCs and TCs for proper technique) Standing leg press with BTB x20 B  Seated LAQ with 5# ankle weight 3x10 B (verbal cues for decrease speed and focus on control) Seated HS curl with RTB 3x10 B (incr difficulty with LLE, verbal cues to control extension)    Pt demonstrates increased fatigue at end of treatment session.     PT Education - 02/17/18 [phone]    Education Details  Patient educated on proper form and technique for therapetuic exercises.     Person(s) Educated  Patient    Methods  Explanation;Demonstration;Tactile cues;Verbal cues    Comprehension  Verbalized understanding;Returned demonstration;Verbal cues required;Tactile cues required          PT Long Term Goals - 01/20/18 1602      PT LONG TERM GOAL #1   Title  Patient will increase SLS to >/= 10s B in order to improve balance and decrease fall risk.    Baseline  ~1s B    Time  4    Period  Weeks    Status  New    Target Date  02/17/18      PT LONG TERM GOAL #2   Title  Pt will improve hip strength (flex and ER) to 4/5 or greater in order to improve functional mobility and return to prior level of function    Baseline  3+/5    Time  6    Period  Weeks    Status  New    Target Date  03/03/18      PT LONG TERM GOAL #3   Title  Pt will report worst pain in past week <8/10 in L knee and <4/10 in R knee in order to improve mobility and return to prior level of function.     Baseline  10/10 L, 6/10 L    Time  4    Period  Weeks    Status  New    Target Date  02/17/18      PT LONG TERM GOAL #4   Title  Pt will repot  worst pain in past week <6/10 L knee and <2/10 R knee in order to improve mobility and return to prior level of function.    Baseline  10/10 L, 6/10 R    Time  6    Period  Weeks    Status  New    Target Date  03/03/18      PT  LONG TERM GOAL #5   Title  Pt will improve FOTO score from 46 to 57 in order to demonstrate improvement in daily function.    Baseline  46    Time  6    Period  Weeks    Target Date  03/03/18      Additional Long Term Goals   Additional Long Term Goals  Yes      PT LONG TERM GOAL #6   Title  Pt will improve 6MWT by 160ft in order to show a clincially significant increase in muscular endurance and cardiovascular endurance.    Baseline  775 ft    Time  6    Period  Weeks    Status  New    Target Date  03/03/18      PT LONG TERM GOAL #7   Title  Pt will improve 10 meter gait speed from .75 m/s to >1 m/s in order to demonstrate decreased fall risk.    Baseline  .75 m/s    Time  6    Period  Weeks    Status  New    Target Date  03/03/18      PT LONG TERM GOAL #8   Title  Pt will improve 5xSTS from 24.19 s to <12 s  in order to demonstrate a clinically significant improvement in functional lower extremity strength and reduce risk of falls..    Baseline  24.19 s    Time  6    Period  Weeks    Status  New    Target Date  03/03/18            Plan - 02/17/18 0942    Clinical Impression Statement  Patient continues to present with decreased LE motor control and requires significant verbal and tactile cueing for proper form and technique. Pt demonstrates decreased L knee flexion during WB therapeutic exercises (STS, mini squats) and shifts weight to RLE. Pt has improved her ability to maintain increased steps per minute on the nu step indicating increased LE muscular endurance. pt will continue to benefit from skilled PT in order to return to prior level of function.     Clinical Impairments Affecting Rehab Potential  chronic pain, multiple comorbidities    PT Frequency  2x / week    PT Duration  6 weeks    PT Treatment/Interventions  ADLs/Self Care Home Management;Cryotherapy;Moist Heat;Electrical Stimulation;Stair training;Gait training;Therapeutic activities;Therapeutic  exercise;Neuromuscular re-education;Balance training;Patient/family education;Manual techniques;Dry needling;Passive range of motion    PT Next Visit Plan  hip strengthening, hurdles, incr difficulty of seated exercises     Consulted and Agree with Plan of Care  Patient       Patient will benefit from skilled therapeutic intervention in order to improve the following deficits and impairments:  Abnormal gait, Decreased activity tolerance, Decreased endurance, Decreased range of motion, Decreased strength, Hypomobility, Pain, Postural dysfunction, Difficulty walking, Decreased mobility, Decreased balance  Visit Diagnosis: Chronic pain of left knee  Chronic pain of right knee  Muscle weakness (generalized)  Stiffness of left knee, not elsewhere classified  Stiffness of right knee, not  elsewhere classified     Problem List Patient Active Problem List   Diagnosis Date Noted  . Pain of right heel 12/30/2017  . Encounter for chronic pain management 12/18/2017  . Foot pain, bilateral 11/25/2017  . Gallbladder polyp 10/21/2017  . Left shoulder pain 10/21/2017  . Benign neoplasm of ascending colon   . Low back pain radiating to left lower extremity 02/26/2017  . Acquired hypothyroidism 09/10/2016  . Thrombocytopenia ([place]) 06/15/2016  . Medicare annual wellness visit, subsequent 06/04/2016  . Advanced care planning/counseling discussion 06/04/2016  . External hemorrhoid 04/02/2016  . Slow transit constipation 01/04/2016  . Bipolar I disorder, most recent episode (or current) manic ([place]) 12/03/2015  . NAFLD (nonalcoholic fatty liver disease) 03/28/2015  . Gallbladder calculus without cholecystitis 03/28/2015  . Gout 03/27/2015  . HCV antibody positive 03/27/2015  . History of bladder cancer 03/27/2015  . History of bundle branch block 03/27/2015  . Genetic testing 02/28/2015  . Loss of memory 09/04/2014  . Controlled type 2 diabetes mellitus with diabetic nephropathy ([place])  06/07/2014  . Hyperlipidemia   . Anemia in CKD (chronic kidney disease) 01/26/2013  . Personal history of colonic adenomas and colon cancer 11/11/2012  . Osteoarthritis of knee 10/02/2012  . OSA on CPAP   . CKD (chronic kidney disease) stage 4, GFR 15-29 ml/min (HCC)   . Severe obesity (BMI 35.0-39.9) with comorbidity ([place])   . HTN (hypertension), benign 04/07/2012  . Hx of colon cancer, stage I 08/05/2011    Georg Ruddle ,SPT 02/17/2018, 9:49 AM  [facility] [address] [place], [address] Phone: [phone]   Fax:  [phone]  Name: Venessa Wickham MRN: [id number] Date of Birth: Jun 30, 1951

## 2018-02-18 ENCOUNTER — Telehealth: Payer: Self-pay | Admitting: Family Medicine

## 2018-02-18 ENCOUNTER — Encounter: Payer: Self-pay | Admitting: Family Medicine

## 2018-02-18 ENCOUNTER — Ambulatory Visit (INDEPENDENT_AMBULATORY_CARE_PROVIDER_SITE_OTHER): Payer: Medicare Other | Admitting: Family Medicine

## 2018-02-18 VITALS — BP 122/78 | HR 72 | Temp 99.1°F | Ht 61.0 in | Wt 200.5 lb

## 2018-02-18 DIAGNOSIS — R21 Rash and other nonspecific skin eruption: Secondary | ICD-10-CM | POA: Insufficient documentation

## 2018-02-18 DIAGNOSIS — N184 Chronic kidney disease, stage 4 (severe): Secondary | ICD-10-CM

## 2018-02-18 DIAGNOSIS — E1121 Type 2 diabetes mellitus with diabetic nephropathy: Secondary | ICD-10-CM | POA: Diagnosis not present

## 2018-02-18 DIAGNOSIS — D696 Thrombocytopenia, unspecified: Secondary | ICD-10-CM

## 2018-02-18 DIAGNOSIS — E039 Hypothyroidism, unspecified: Secondary | ICD-10-CM

## 2018-02-18 DIAGNOSIS — F3110 Bipolar disorder, current episode manic without psychotic features, unspecified: Secondary | ICD-10-CM

## 2018-02-18 LAB — T4, FREE: Free T4: 0.69 ng/dL (ref 0.60–1.60)

## 2018-02-18 LAB — HEMOGLOBIN A1C: Hgb A1c MFr Bld: 6.6 % — ABNORMAL HIGH (ref 4.6–6.5)

## 2018-02-18 LAB — TSH: TSH: 2.84 u[IU]/mL (ref 0.35–4.50)

## 2018-02-18 MED ORDER — CLOTRIMAZOLE 1 % EX CREA: 1.0000 "application " | TOPICAL_CREAM | CUTANEOUS | 0 refills | Status: DC

## 2018-02-18 MED ORDER — SITAGLIPTIN PHOSPHATE 25 MG PO TABS
25.0000 mg | ORAL_TABLET | Freq: Every day | ORAL | 3 refills | Status: AC
Start: 2018-02-18 — End: ?

## 2018-02-18 MED ORDER — TRIAMCINOLONE ACETONIDE 0.1 % EX CREA: 1.0000 "application " | TOPICAL_CREAM | Freq: Every day | CUTANEOUS | 0 refills | Status: DC

## 2018-02-18 MED ORDER — SITAGLIPTIN PHOSPHATE 25 MG PO TABS: 25.0000 mg | ORAL_TABLET | Freq: Every day | ORAL | 3 refills | Status: DC

## 2018-02-18 NOTE — Assessment & Plan Note (Signed)
Skin rash bilateral hands at interdigital areas with mild desquamation, limited to hands. Contact dermatitis vs tinea manuum vs drug rash (on lamictal). Has not responded to nystatin or clotrimazole. Will Rx TCI cream and recommend continue clotrimazole. If not improving, will need to consider discontinuing lamisil. Advised notify us immediately if any spreading of rash.

## 2018-02-18 NOTE — Assessment & Plan Note (Addendum)
Chronic, stable. cbg's on log she brings are above goal but adequate. Given CKD - will trial [place] 25mg  daily in place of metformin. Pt to price this out.

## 2018-02-18 NOTE — Assessment & Plan Note (Addendum)
Thought depakote related - heme recommended a trial off or lowering dose - advised pt to discuss with psychiatry.

## 2018-02-18 NOTE — Assessment & Plan Note (Signed)
Recheck levels on levothyroxine 43mcg daily.

## 2018-02-18 NOTE — Telephone Encounter (Signed)
I spoke with Gina [name] and advised triamcinolone cream to affected area on hands. [name] voiced understanding.nothing further needed.

## 2018-02-18 NOTE — Assessment & Plan Note (Addendum)
UA stable 11/2017 at ER.  Check urine microalb next visit.  Will take off metformin.  Consider nephrology eval.

## 2018-02-18 NOTE — Telephone Encounter (Signed)
Copied from [place] [id number]. Topic: Quick Communication - See Telephone Encounter >> Feb 18, 2018  1:27 PM Burchel, Abbi R wrote: CRM for notification. See Telephone encounter for: 02/18/18.  triamcinolone cream (KENALOG) 0.1 %  Needs to know where cream is to be applied.    [place]: [phone]

## 2018-02-18 NOTE — Progress Notes (Signed)
BP 122/78 (BP Location: Left Arm, Patient Position: Sitting, Cuff Size: Normal)   Pulse 72   Temp 99.1 F (37.3 C) (Oral)   Ht 5\' 1"  (1.549 m)   Wt 200 lb 8 oz (90.9 kg)   SpO2 96%   BMI 37.88 kg/m    CC: skin rash on hands Subjective:    Patient ID: Gina [name], female    DOB: 07-Mar-1951, 67 y.o.   MRN: [id number]  HPI: Gina [name] is a 67 y.o. female presenting on 02/18/2018 for Skin Irritation (Pt still has irritation between fingers on bilateral hands. Areas are red, itching and burning. Sxs have worsened. Pt has tried Nystop powder, not helpful.)   6-8 wk h/o skin irritation between hands worsening, itchy and burning. Nystatin powder did not help. Clotrimazole cream did not help. She stopped using powder. Prior was using alcohol based hand sanitizer. She started using dove soap with some benefit. Denies other rash. Denies fevers/chills.   She is on lamictal and depakote. Seroquel recently stopped (2 wks ago).   Chronic thrombocytopenia - saw hematology who thought this was side effect of depakote. To discuss this with her psychiatrist. Hematology also recommended genetic testing.   Chronic kidney disease Cr baseline seems 1.6-1.8.   Brings log of sugars over the last few months - checks a few times a week fasting - 110-140. Compliant with metformin 500mg  once daily.  Lab Results  Component Value Date   HGBA1C 5.9 (H) 11/14/2017    Lab Results  Component Value Date   CREATININE 1.85 (H) 02/10/2018   BUN 38 (H) 02/10/2018   NA 137 02/10/2018   K 5.1 02/10/2018   CL 105 02/10/2018   CO2 22 02/10/2018     Relevant past medical, surgical, family and social history reviewed and updated as indicated. Interim medical history since our last visit reviewed. Allergies and medications reviewed and updated. Outpatient Medications Prior to Visit  Medication Sig Dispense Refill  . ACCU-CHEK AVIVA PLUS test strip 1 each by Other route daily. E11.21 100  each 3  . bisacodyl (DULCOLAX) 10 MG suppository Place 1 suppository (10 mg total) rectally daily as needed for moderate constipation. 12 suppository 0  . colchicine 0.6 MG tablet Take 0.6 mg by mouth daily. Take 2 tablets on first day, then 1 tablet daily for 10 day as needed for gout    . diclofenac sodium (VOLTAREN) 1 % GEL Apply 4 g topically 4 (four) times daily. To knees 5 Tube 5  . divalproex (DEPAKOTE) 500 MG DR tablet Take 1 tablet (500 mg total) by mouth 2 (two) times daily with a meal. 60 tablet 3  . docusate sodium (COLACE) 100 MG capsule Take 2 capsules (200 mg total) by mouth 2 (two) times daily. 60 capsule 1  . GAVILAX powder TAKE 17 GRAMS BY MOUTH TWICE DAILY AS NEEDED FOR MODERATE CONSTIPATION. 510 g 1  . HYDROcodone-acetaminophen (NORCO/VICODIN) 5-325 MG tablet Take 1 tablet by mouth 3 (three) times daily as needed for moderate pain. 15 tablet 0  . lamoTRIgine (LAMICTAL) 25 MG tablet Take 2 tablets (50 mg total) by mouth 2 (two) times daily. 120 tablet 1  . levothyroxine (SYNTHROID, LEVOTHROID) 50 MCG tablet Take 1 tablet (50 mcg total) by mouth daily before breakfast. 30 tablet 11  . losartan (COZAAR) 50 MG tablet Take 1 tablet (50 mg total) by mouth daily. 30 tablet 11  . MELATONIN PO Take 1 tablet by mouth at bedtime.    [name]  metoprolol tartrate (LOPRESSOR) 25 MG tablet Take 0.5 tablets (12.5 mg total) by mouth 2 (two) times daily. 60 tablet 1  . paliperidone (INVEGA SUSTENNA) 234 MG/1.5ML SUSP injection Inject 234 mg into the muscle every 30 (thirty) days.    [name] senna (SENOKOT) 8.6 MG TABS tablet Take 1 tablet (8.6 mg total) by mouth at bedtime as needed for mild constipation. 120 each 0  . simvastatin (ZOCOR) 20 MG tablet Take 1 tablet (20 mg total) by mouth at bedtime. 30 tablet 11  . clotrimazole (LOTRIMIN) 1 % cream Apply 1 application topically 2 (two) times daily. To affected area on toes 30 g 0  . metFORMIN (GLUCOPHAGE) 500 MG tablet TAKE (1) TABLET BY MOUTH DAILY WITH  BREAKFAST. 30 tablet 0  . QUEtiapine (SEROQUEL) 300 MG tablet Take 1 tablet (300 mg total) by mouth at bedtime. 30 tablet 1   No facility-administered medications prior to visit.      Per HPI unless specifically indicated in ROS section below Review of Systems     Objective:    BP 122/78 (BP Location: Left Arm, Patient Position: Sitting, Cuff Size: Normal)   Pulse 72   Temp 99.1 F (37.3 C) (Oral)   Ht 5\' 1"  (1.549 m)   Wt 200 lb 8 oz (90.9 kg)   SpO2 96%   BMI 37.88 kg/m   Wt Readings from Last 3 Encounters:  02/18/18 200 lb 8 oz (90.9 kg)  02/10/18 200 lb 4.8 oz (90.9 kg)  01/06/18 200 lb 8 oz (90.9 kg)    Physical Exam  Constitutional: She appears well-developed and well-nourished. No distress.  Skin: Skin is warm and dry. Rash noted. There is erythema.  Erythematous interdigital areas bilateral 2nd - 4th fingers with mild desquamation, pruritic, no satellite lesions  Nursing note and vitals reviewed.  Results for orders placed or performed in visit on 02/12/18  HM MAMMOGRAPHY  Result Value Ref Range   HM Mammogram 0-4 Bi-Rad 0-4 Bi-Rad, Self Reported Normal      Assessment & Plan:   Problem List Items Addressed This Visit    Thrombocytopenia ([place])    Thought depakote related - heme recommended a trial off or lowering dose - advised pt to discuss with psychiatry.       Rash of hands    Skin rash bilateral hands at interdigital areas with mild desquamation, limited to hands. Contact dermatitis vs tinea manuum vs drug rash (on lamictal). Has not responded to nystatin or clotrimazole. Will Rx TCI cream and recommend continue clotrimazole. If not improving, will need to consider discontinuing lamisil. Advised notify us immediately if any spreading of rash.       Controlled type 2 diabetes mellitus with diabetic nephropathy (HCC) - Primary    Chronic, stable. cbg's on log she brings are above goal but adequate. Given CKD - will trial [place] 25mg  daily in place of  metformin. Pt to price this out.       Relevant Medications   sitaGLIPtin (JANUVIA) 25 MG tablet   Other Relevant Orders   Hemoglobin A1c   CKD (chronic kidney disease) stage 4, GFR 15-29 ml/min (HCC)    UA stable 11/2017 at ER.  Check urine microalb next visit.  Will take off metformin.  Consider nephrology eval.      Bipolar I disorder, most recent episode (or current) manic ([place])    Stable period on current regimen without recent mania. Appreciate psych care.       Acquired hypothyroidism  Recheck levels on levothyroxine 106mcg daily.      Relevant Orders   TSH   T4, free       Meds ordered this encounter  Medications  . DISCONTD: sitaGLIPtin (JANUVIA) 25 MG tablet    Sig: Take 1 tablet (25 mg total) by mouth daily.    Dispense:  30 tablet    Refill:  3    Price out and compare to other DPP4 inhibitor brands  . sitaGLIPtin (JANUVIA) 25 MG tablet    Sig: Take 1 tablet (25 mg total) by mouth daily.    Dispense:  30 tablet    Refill:  3    To take in place of metformin. Price out and compare to other DPP4 inhibitor brands  . clotrimazole (LOTRIMIN) 1 % cream    Sig: Apply 1 application topically every morning. To affected area on toes    Dispense:  30 g    Refill:  0  . triamcinolone cream (KENALOG) 0.1 %    Sig: Apply 1 application topically at bedtime. Apply to AA.    Dispense:  30 g    Refill:  0   Orders Placed This Encounter  Procedures  . TSH  . T4, free  . Hemoglobin A1c    Follow up plan: Return if symptoms worsen or fail to improve.  Ria Bush, MD

## 2018-02-18 NOTE — Assessment & Plan Note (Signed)
Stable period on current regimen without recent mania. Appreciate psych care.

## 2018-02-18 NOTE — Patient Instructions (Addendum)
Labs today Let's stop metformin for now. Price out [place] 25mg  daily new diabetes medicine.  For skin rash - continue clotrimazole cream in the morning, add triamcinolone cream to skin do at bedtime. Let us know right away of spreading rash.  Stop using any alcohol based hand sanitizer. I think this is a contact dermatitis. If not improving, rash may be coming from lamictal?

## 2018-02-22 ENCOUNTER — Telehealth: Payer: Self-pay | Admitting: *Deleted

## 2018-02-22 NOTE — Telephone Encounter (Signed)
NP [facility] Psych practioner is needing to speak with Dr Tasia Catchings regarding patient and side effects patient is having from Depakote. Please return her call [phone]

## 2018-02-23 ENCOUNTER — Ambulatory Visit: Payer: Medicare Other

## 2018-02-23 DIAGNOSIS — M25662 Stiffness of left knee, not elsewhere classified: Secondary | ICD-10-CM

## 2018-02-23 DIAGNOSIS — M25562 Pain in left knee: Principal | ICD-10-CM

## 2018-02-23 DIAGNOSIS — M25561 Pain in right knee: Secondary | ICD-10-CM | POA: Diagnosis not present

## 2018-02-23 DIAGNOSIS — M25661 Stiffness of right knee, not elsewhere classified: Secondary | ICD-10-CM | POA: Diagnosis not present

## 2018-02-23 DIAGNOSIS — G8929 Other chronic pain: Secondary | ICD-10-CM | POA: Diagnosis not present

## 2018-02-23 DIAGNOSIS — M6281 Muscle weakness (generalized): Secondary | ICD-10-CM | POA: Diagnosis not present

## 2018-02-23 NOTE — Therapy (Signed)
[facility] [address], [place], [address] Phone: [phone]   Fax:  [phone]  Physical Therapy Treatment  Patient Details  Name: Gina [name] MRN: [id number] Date of Birth: 1950/10/05 No data recorded  Encounter Date: 02/23/2018  PT End of Session - 02/23/18 1311    Visit Number  6    Number of Visits  13    Date for PT Re-Evaluation  03/03/18    PT Start Time  1300    PT Stop Time  1345    PT Time Calculation (min)  45 min    Activity Tolerance  Patient tolerated treatment well    Behavior During Therapy  [facility] for tasks assessed/performed       Past Medical History:  Diagnosis Date  . Anemia in chronic kidney disease   . Bipolar depression [facility])    sees psychiatrist - psych admission 03/2015  . Bladder cancer ([place]) 1990's  . Blood transfusion without reported diagnosis 2009  . Cataract    left  . CKD (chronic kidney disease) stage 3, GFR 30-59 ml/min (HCC)   . Colon cancer ([place]) 1990's  . DJD (degenerative joint disease), lumbar    chronic lower back pain  . Enterocutaneous fistula 04/07/2012   completed PT 06/2012 ([facility])  . Family history of breast cancer   . Family history of colon cancer   . Family history of stomach cancer   . GERD (gastroesophageal reflux disease)   . History of bladder cancer 1997  . History of colon cancer    s/p surgery  . History of uterine cancer    s/p hysterectomy  . Hyperlipidemia   . Hypertension   . IDA (iron deficiency anemia) 01/2013   thought due to h/o polyps  . Obesity (BMI 30-39.9)   . OSA on CPAP    6cm H2O  . Personal history of colonic adenomas and colon cancer 11/11/2012  . Positive hepatitis C antibody test 09/2014   but negative confirmatory testing  . RBBB   . Uncontrolled type 2 diabetes mellitus with nephropathy ([place]) 06/07/2014   completed DSME 02/2016    Past Surgical History:  Procedure Laterality Date  . BREAST  BIOPSY Right 01/2014   fibroadenoma  . COLONOSCOPY  07/2009  . COLONOSCOPY  11/2012   2 tubular adenomas, mild diverticulosis, pending genetic testing for Lynch syndrome [name]) rpt 2 yrs  . COLONOSCOPY  02/2015   TA, diverticulosis, rpt 2 yrs [name])  . COLONOSCOPY WITH PROPOFOL N/A 06/09/2017   TAx1, rpt 2 yrs [name], [name], MD)  . DEXA  12/2009   WNL  . DOBUTAMINE STRESS ECHO  12/2009   no evidence of ischemia  . HERNIA REPAIR  02/04/12  . i & d abdominal wound  02/19/12  . LEFT OOPHORECTOMY  2005  . PARTIAL COLECTOMY  about 2008   for colon cancer  . PARTIAL HYSTERECTOMY  1981   uterine cancer, R ovary remains  . Reexploration of abdominal wound and Allograft placemet  02/26/12  . Removal of infected mesh and abdominal wound vac placement  02/24/12  . sleep study  02/2014   OSA - AHI 55, nadir 81% ([place])    There were no vitals filed for this visit.  Subjective Assessment - 02/23/18 1308    Subjective  Pt reports that knee i smore sore today due to the weather. Pt also reports that her doctor told her that one of her medications may  be causing her to be in pain.     Limitations  Walking;Standing    How long can you stand comfortably?  30 minutes    How long can you walk comfortably?  10 minutes    Patient Stated Goals  decrease pain, go on walks with no pain    Currently in Pain?  Yes    Pain Score  10-Worst pain ever    Pain Location  Knee    Pain Orientation  Left    Pain Descriptors / Indicators  Aching    Pain Type  Chronic pain    Pain Onset  More than a month ago    Pain Score  6    Pain Location  Knee    Pain Orientation  Right    Pain Descriptors / Indicators  Aching    Pain Type  Chronic pain    Pain Onset  More than a month ago         TREATMENT Therapeutic Exercises Nu step level 3 (seat level 8) LE only, x10 minutes (improved tolerance, able to maintain 50-100 spm) Sit to stands 7x3 from green chair with airex and pillow (improved form, verbal cueing  required but able to achieve with less difficulty) Standing leg press with BTB x20 B  Seated LAQ with 5# ankle weight x20, 7.5# ankle weight x10 B ( decr verbal cues required for good control) Seated HS curl with RTB 3x10 B (incr difficulty with LLE, verbal cues to control extension) Steps B handrails x4 step to pattern Step ups to 3" step one UE support (incr difficulty stepping donw with RLE due to decr eccentric control of LLE)   Pt demonstrates increased fatigue at end of treatment session.     PT Education - 02/23/18 1310    Education Details  Pt educated on proper form and technique for the therapeutic exercises.    Person(s) Educated  Patient    Methods  Explanation;Demonstration;Tactile cues;Verbal cues    Comprehension  Verbalized understanding;Returned demonstration;Tactile cues required;Verbal cues required          PT Long Term Goals - 01/20/18 1602      PT LONG TERM GOAL #1   Title  Patient will increase SLS to >/= 10s B in order to improve balance and decrease fall risk.    Baseline  ~1s B    Time  4    Period  Weeks    Status  New    Target Date  02/17/18      PT LONG TERM GOAL #2   Title  Pt will improve hip strength (flex and ER) to 4/5 or greater in order to improve functional mobility and return to prior level of function    Baseline  3+/5    Time  6    Period  Weeks    Status  New    Target Date  03/03/18      PT LONG TERM GOAL #3   Title  Pt will report worst pain in past week <8/10 in L knee and <4/10 in R knee in order to improve mobility and return to prior level of function.     Baseline  10/10 L, 6/10 L    Time  4    Period  Weeks    Status  New    Target Date  02/17/18      PT LONG TERM GOAL #4   Title  Pt will repot worst pain in past week <6/10  L knee and <2/10 R knee in order to improve mobility and return to prior level of function.    Baseline  10/10 L, 6/10 R    Time  6    Period  Weeks    Status  New    Target Date  03/03/18       PT LONG TERM GOAL #5   Title  Pt will improve FOTO score from 46 to 57 in order to demonstrate improvement in daily function.    Baseline  46    Time  6    Period  Weeks    Target Date  03/03/18      Additional Long Term Goals   Additional Long Term Goals  Yes      PT LONG TERM GOAL #6   Title  Pt will improve 6MWT by 140ft in order to show a clincially significant increase in muscular endurance and cardiovascular endurance.    Baseline  775 ft    Time  6    Period  Weeks    Status  New    Target Date  03/03/18      PT LONG TERM GOAL #7   Title  Pt will improve 10 meter gait speed from .75 m/s to >1 m/s in order to demonstrate decreased fall risk.    Baseline  .75 m/s    Time  6    Period  Weeks    Status  New    Target Date  03/03/18      PT LONG TERM GOAL #8   Title  Pt will improve 5xSTS from 24.19 s to <12 s  in order to demonstrate a clinically significant improvement in functional lower extremity strength and reduce risk of falls..    Baseline  24.19 s    Time  6    Period  Weeks    Status  New    Target Date  03/03/18            Plan - 02/23/18 1357    Clinical Impression Statement  Pt presents with significant difficulty with eccentric flexion of L knee indicated by decr ability to perform step down with RLE. Pt demonstrates improved ability to perform sit to stand exercises with decreased weight shift to the R while sitting. Pt also required decreased cueing for seated knee flexion and HS curls. Pt will continue to benefit from skilled PT in order to return to prior level of function.     Rehab Potential  Fair    Clinical Impairments Affecting Rehab Potential  chronic pain, multiple comorbidities    PT Frequency  2x / week    PT Duration  6 weeks    PT Treatment/Interventions  ADLs/Self Care Home Management;Cryotherapy;Moist Heat;Electrical Stimulation;Stair training;Gait training;Therapeutic activities;Therapeutic exercise;Neuromuscular re-education;Balance  training;Patient/family education;Manual techniques;Dry needling;Passive range of motion    PT Next Visit Plan  hip strengthening, hurdles, incr difficulty of seated exercises, step downs     Consulted and Agree with Plan of Care  Patient       Patient will benefit from skilled therapeutic intervention in order to improve the following deficits and impairments:  Abnormal gait, Decreased activity tolerance, Decreased endurance, Decreased range of motion, Decreased strength, Hypomobility, Pain, Postural dysfunction, Difficulty walking, Decreased mobility, Decreased balance  Visit Diagnosis: Chronic pain of left knee  Chronic pain of right knee  Muscle weakness (generalized)  Stiffness of left knee, not elsewhere classified  Stiffness of right knee, not elsewhere classified  Problem List Patient Active Problem List   Diagnosis Date Noted  . Rash of hands 02/18/2018  . Pain of right heel 12/30/2017  . Encounter for chronic pain management 12/18/2017  . Foot pain, bilateral 11/25/2017  . Gallbladder polyp 10/21/2017  . Left shoulder pain 10/21/2017  . Benign neoplasm of ascending colon   . Low back pain radiating to left lower extremity 02/26/2017  . Acquired hypothyroidism 09/10/2016  . Thrombocytopenia ([place]) 06/15/2016  . Medicare annual wellness visit, subsequent 06/04/2016  . Advanced care planning/counseling discussion 06/04/2016  . External hemorrhoid 04/02/2016  . Slow transit constipation 01/04/2016  . Bipolar I disorder, most recent episode (or current) manic ([place]) 12/03/2015  . NAFLD (nonalcoholic fatty liver disease) 03/28/2015  . Gallbladder calculus without cholecystitis 03/28/2015  . Gout 03/27/2015  . HCV antibody positive 03/27/2015  . History of bladder cancer 03/27/2015  . History of bundle branch block 03/27/2015  . Genetic testing 02/28/2015  . Loss of memory 09/04/2014  . Controlled type 2 diabetes mellitus with diabetic nephropathy ([place]) 06/07/2014   . Hyperlipidemia   . Anemia in CKD (chronic kidney disease) 01/26/2013  . Personal history of colonic adenomas and colon cancer 11/11/2012  . Osteoarthritis of knee 10/02/2012  . OSA on CPAP   . CKD (chronic kidney disease) stage 4, GFR 15-29 ml/min (HCC)   . Severe obesity (BMI 35.0-39.9) with comorbidity ([place])   . HTN (hypertension), benign 04/07/2012  . Hx of colon cancer, stage I 08/05/2011    Georg Ruddle, SPT 02/23/2018, 2:00 PM  [facility] [address], [place], [address] Phone: [phone]   Fax:  [phone]  Name: Gina [name] MRN: [id number] Date of Birth: [birth year]

## 2018-02-23 NOTE — Telephone Encounter (Signed)
I have called Lorie and discussed patient's case.  We agree that thrombocytopenia is likely due to Dapakote. Psych has plans to take patient off but if her symptom is not controlled, she may have to stay on Depakote.  Current her thrombocytopenia is mild, counts above 100,000. Suggests close monitoring counts monthly if she has to stay on Depakote.  If she remains stable, lab monitoring interval  can be further increased to every 3 months.Gina [name]

## 2018-02-25 ENCOUNTER — Ambulatory Visit: Payer: Medicare Other

## 2018-02-25 DIAGNOSIS — M25561 Pain in right knee: Secondary | ICD-10-CM | POA: Diagnosis not present

## 2018-02-25 DIAGNOSIS — M25562 Pain in left knee: Secondary | ICD-10-CM | POA: Diagnosis not present

## 2018-02-25 DIAGNOSIS — M25662 Stiffness of left knee, not elsewhere classified: Secondary | ICD-10-CM | POA: Diagnosis not present

## 2018-02-25 DIAGNOSIS — G8929 Other chronic pain: Secondary | ICD-10-CM | POA: Diagnosis not present

## 2018-02-25 DIAGNOSIS — M25661 Stiffness of right knee, not elsewhere classified: Secondary | ICD-10-CM | POA: Diagnosis not present

## 2018-02-25 DIAGNOSIS — M6281 Muscle weakness (generalized): Secondary | ICD-10-CM

## 2018-02-25 NOTE — Therapy (Signed)
[facility] [address], [place], [address] Phone: [phone]   Fax:  [phone]  Physical Therapy Treatment  Patient Details  Name: Gina [name] MRN: [id number] Date of Birth: Sep 10, 1950 No data recorded  Encounter Date: 02/25/2018  PT End of Session - 02/25/18 1123    Visit Number  7    Number of Visits  13    Date for PT Re-Evaluation  03/03/18    PT Start Time  1115    PT Stop Time  1200    PT Time Calculation (min)  45 min    Activity Tolerance  Patient tolerated treatment well    Behavior During Therapy  [facility] for tasks assessed/performed       Past Medical History:  Diagnosis Date  . Anemia in chronic kidney disease   . Bipolar depression [facility])    sees psychiatrist - psych admission 03/2015  . Bladder cancer ([place]) 1990's  . Blood transfusion without reported diagnosis 2009  . Cataract    left  . CKD (chronic kidney disease) stage 3, GFR 30-59 ml/min (HCC)   . Colon cancer ([employer]) 1990's  . DJD (degenerative joint disease), lumbar    chronic lower back pain  . Enterocutaneous fistula 04/07/2012   completed PT 06/2012 ([facility])  . Family history of breast cancer   . Family history of colon cancer   . Family history of stomach cancer   . GERD (gastroesophageal reflux disease)   . History of bladder cancer 1997  . History of colon cancer    s/p surgery  . History of uterine cancer    s/p hysterectomy  . Hyperlipidemia   . Hypertension   . IDA (iron deficiency anemia) 01/2013   thought due to h/o polyps  . Obesity (BMI 30-39.9)   . OSA on CPAP    6cm H2O  . Personal history of colonic adenomas and colon cancer 11/11/2012  . Positive hepatitis C antibody test 09/2014   but negative confirmatory testing  . RBBB   . Uncontrolled type 2 diabetes mellitus with nephropathy ([place]) 06/07/2014   completed DSME 02/2016    Past Surgical History:  Procedure Laterality Date  . BREAST  BIOPSY Right 01/2014   fibroadenoma  . COLONOSCOPY  07/2009  . COLONOSCOPY  11/2012   2 tubular adenomas, mild diverticulosis, pending genetic testing for Lynch syndrome [name]) rpt 2 yrs  . COLONOSCOPY  02/2015   TA, diverticulosis, rpt 2 yrs [name])  . COLONOSCOPY WITH PROPOFOL N/A 06/09/2017   TAx1, rpt 2 yrs [name], [name], MD)  . DEXA  12/2009   WNL  . DOBUTAMINE STRESS ECHO  12/2009   no evidence of ischemia  . HERNIA REPAIR  02/04/12  . i & d abdominal wound  02/19/12  . LEFT OOPHORECTOMY  2005  . PARTIAL COLECTOMY  about 2008   for colon cancer  . PARTIAL HYSTERECTOMY  1981   uterine cancer, R ovary remains  . Reexploration of abdominal wound and Allograft placemet  02/26/12  . Removal of infected mesh and abdominal wound vac placement  02/24/12  . sleep study  02/2014   OSA - AHI 55, nadir 81% ([place])    There were no vitals filed for this visit.  Subjective Assessment - 02/25/18 1120    Subjective  Pt reports that knee feels the same. No significant changes since previous session.     Patient is accompained by:  Family member  Limitations  Walking;Standing    How long can you stand comfortably?  30 minutes    How long can you walk comfortably?  10 minutes    Patient Stated Goals  decrease pain, go on walks with no pain    Currently in Pain?  Yes    Pain Score  10-Worst pain ever    Pain Location  Knee    Pain Orientation  Left    Pain Descriptors / Indicators  Aching    Pain Type  Chronic pain    Pain Onset  More than a month ago    Pain Frequency  Constant    Pain Score  5    Pain Location  Knee    Pain Orientation  Right    Pain Descriptors / Indicators  Aching    Pain Type  Chronic pain    Pain Onset  More than a month ago    Pain Frequency  Constant       TREATMENT Therapeutic Exercises Nu step level 3 (seat level 8) LE only, x10 minutes (improved tolerance, able to maintain 50-100 spm) Sit to stands from chair with airex and pillow 10x3 (verbal cueing  to incr wb through LLE throughout) Seated LAQ with 7.5# ankle weight x20 B  Seated HS curl with RTB x20 B Step ups to 2" mat with RLE x20 no UE support Step downs from 2" step leading with RLE to increase LLE eccentric knee flexion tolerance 2x10 no UE support Seated hip abduction with GTB around distal thigh 10x3 Seated hip adduction squeezing medicine ball 10x3  Pt demonstrates increased fatigue at end of treatment session.    PT Education - 02/25/18 1122    Education Details  Pt educated on proper form and technique for therapeutic exercises.    Person(s) Educated  Patient    Methods  Explanation;Demonstration;Verbal cues;Tactile cues    Comprehension  Verbalized understanding;Returned demonstration;Verbal cues required;Tactile cues required          PT Long Term Goals - 01/20/18 1602      PT LONG TERM GOAL #1   Title  Patient will increase SLS to >/= 10s B in order to improve balance and decrease fall risk.    Baseline  ~1s B    Time  4    Period  Weeks    Status  New    Target Date  02/17/18      PT LONG TERM GOAL #2   Title  Pt will improve hip strength (flex and ER) to 4/5 or greater in order to improve functional mobility and return to prior level of function    Baseline  3+/5    Time  6    Period  Weeks    Status  New    Target Date  03/03/18      PT LONG TERM GOAL #3   Title  Pt will report worst pain in past week <8/10 in L knee and <4/10 in R knee in order to improve mobility and return to prior level of function.     Baseline  10/10 L, 6/10 L    Time  4    Period  Weeks    Status  New    Target Date  02/17/18      PT LONG TERM GOAL #4   Title  Pt will repot worst pain in past week <6/10 L knee and <2/10 R knee in order to improve mobility and return to prior level of  function.    Baseline  10/10 L, 6/10 R    Time  6    Period  Weeks    Status  New    Target Date  03/03/18      PT LONG TERM GOAL #5   Title  Pt will improve FOTO score from 46 to 57  in order to demonstrate improvement in daily function.    Baseline  46    Time  6    Period  Weeks    Target Date  03/03/18      Additional Long Term Goals   Additional Long Term Goals  Yes      PT LONG TERM GOAL #6   Title  Pt will improve 6MWT by 176ft in order to show a clincially significant increase in muscular endurance and cardiovascular endurance.    Baseline  775 ft    Time  6    Period  Weeks    Status  New    Target Date  03/03/18      PT LONG TERM GOAL #7   Title  Pt will improve 10 meter gait speed from .75 m/s to >1 m/s in order to demonstrate decreased fall risk.    Baseline  .75 m/s    Time  6    Period  Weeks    Status  New    Target Date  03/03/18      PT LONG TERM GOAL #8   Title  Pt will improve 5xSTS from 24.19 s to <12 s  in order to demonstrate a clinically significant improvement in functional lower extremity strength and reduce risk of falls..    Baseline  24.19 s    Time  6    Period  Weeks    Status  New    Target Date  03/03/18              Patient will benefit from skilled therapeutic intervention in order to improve the following deficits and impairments:     Visit Diagnosis: Chronic pain of left knee  Chronic pain of right knee  Muscle weakness (generalized)  Stiffness of left knee, not elsewhere classified  Stiffness of right knee, not elsewhere classified     Problem List Patient Active Problem List   Diagnosis Date Noted  . Rash of hands 02/18/2018  . Pain of right heel 12/30/2017  . Encounter for chronic pain management 12/18/2017  . Foot pain, bilateral 11/25/2017  . Gallbladder polyp 10/21/2017  . Left shoulder pain 10/21/2017  . Benign neoplasm of ascending colon   . Low back pain radiating to left lower extremity 02/26/2017  . Acquired hypothyroidism 09/10/2016  . Thrombocytopenia ([place]) 06/15/2016  . Medicare annual wellness visit, subsequent 06/04/2016  . Advanced care planning/counseling discussion  06/04/2016  . External hemorrhoid 04/02/2016  . Slow transit constipation 01/04/2016  . Bipolar I disorder, most recent episode (or current) manic ([place]) 12/03/2015  . NAFLD (nonalcoholic fatty liver disease) 03/28/2015  . Gallbladder calculus without cholecystitis 03/28/2015  . Gout 03/27/2015  . HCV antibody positive 03/27/2015  . History of bladder cancer 03/27/2015  . History of bundle branch block 03/27/2015  . Genetic testing 02/28/2015  . Loss of memory 09/04/2014  . Controlled type 2 diabetes mellitus with diabetic nephropathy ([place]) 06/07/2014  . Hyperlipidemia   . Anemia in CKD (chronic kidney disease) 01/26/2013  . Personal history of colonic adenomas and colon cancer 11/11/2012  . Osteoarthritis of knee 10/02/2012  . OSA  on CPAP   . CKD (chronic kidney disease) stage 4, GFR 15-29 ml/min (HCC)   . Severe obesity (BMI 35.0-39.9) with comorbidity ([place])   . HTN (hypertension), benign 04/07/2012  . Hx of colon cancer, stage I 08/05/2011    Georg Ruddle, SPT 02/25/2018, 11:25 AM  [facility] [address] [place], [address] Phone: [phone]   Fax:  [phone]  Name: Nasiya Pascual MRN: [id number] Date of Birth: [birth year]

## 2018-03-01 ENCOUNTER — Ambulatory Visit (INDEPENDENT_AMBULATORY_CARE_PROVIDER_SITE_OTHER): Payer: Medicare Other | Admitting: Family Medicine

## 2018-03-01 ENCOUNTER — Encounter: Payer: Self-pay | Admitting: Family Medicine

## 2018-03-01 VITALS — BP 120/70 | HR 75 | Temp 98.2°F | Ht 61.0 in | Wt 203.2 lb

## 2018-03-01 DIAGNOSIS — R21 Rash and other nonspecific skin eruption: Secondary | ICD-10-CM | POA: Diagnosis not present

## 2018-03-01 DIAGNOSIS — E1121 Type 2 diabetes mellitus with diabetic nephropathy: Secondary | ICD-10-CM

## 2018-03-01 DIAGNOSIS — E039 Hypothyroidism, unspecified: Secondary | ICD-10-CM

## 2018-03-01 DIAGNOSIS — D696 Thrombocytopenia, unspecified: Secondary | ICD-10-CM | POA: Diagnosis not present

## 2018-03-01 DIAGNOSIS — N184 Chronic kidney disease, stage 4 (severe): Secondary | ICD-10-CM | POA: Diagnosis not present

## 2018-03-01 DIAGNOSIS — F3110 Bipolar disorder, current episode manic without psychotic features, unspecified: Secondary | ICD-10-CM

## 2018-03-01 LAB — MICROALBUMIN / CREATININE URINE RATIO
Creatinine,U: 22.2 mg/dL
Microalb Creat Ratio: 3.2 mg/g (ref 0.0–30.0)
Microalb, Ur: <0.7 mg/dL (ref 0.0–1.9)

## 2018-03-01 NOTE — Assessment & Plan Note (Signed)
Levels normal on recheck - continue levothyroxine 5mcg daily.

## 2018-03-01 NOTE — Assessment & Plan Note (Signed)
This has improved with TCI and clotrimazole. ?contact dermatitis. rec finish 2 wk course then stop steroid cream.  Will monitor off steroid.

## 2018-03-01 NOTE — Assessment & Plan Note (Addendum)
Reviewed meds with patient, med list updated (she is now on seroquel 300mg  nightly - added). Plan is to try and taper off depakote in hopes of improving thrombocytopenia.

## 2018-03-01 NOTE — Progress Notes (Signed)
BP 120/70 (BP Location: Right Arm, Patient Position: Sitting, Cuff Size: Normal)   Pulse 75   Temp 98.2 F (36.8 C) (Oral)   Ht 5\' 1"  (1.549 m)   Wt 203 lb 4 oz (92.2 kg)   SpO2 96%   BMI 38.40 kg/m    CC: 2 mo f/u visit Subjective:    Patient ID: Gina [name], female    DOB: 07-10-1951, 67 y.o.   MRN: [id number]  HPI: Gina [name] is a 67 y.o. female presenting on 03/01/2018 for 2 mo follow up   DM - last visit januvia 25mg  was started in place of metformin due to CKD. She has continued taking metformin although I wanted this stopped.   Planning to stop depakote in hopes of improving platelet levels.   Hand rash - improved with triamcinolone and clotrimazole.   She is not taking narcotic at this time. States knee pain well controlled with OTC analgesics.   Relevant past medical, surgical, family and social history reviewed and updated as indicated. Interim medical history since our last visit reviewed. Allergies and medications reviewed and updated. Outpatient Medications Prior to Visit  Medication Sig Dispense Refill  . ACCU-CHEK AVIVA PLUS test strip 1 each by Other route daily. E11.21 100 each 3  . bisacodyl (DULCOLAX) 10 MG suppository Place 1 suppository (10 mg total) rectally daily as needed for moderate constipation. 12 suppository 0  . clotrimazole (LOTRIMIN) 1 % cream Apply 1 application topically every morning. To affected area on toes 30 g 0  . colchicine 0.6 MG tablet Take 0.6 mg by mouth daily. Take 2 tablets on first day, then 1 tablet daily for 10 day as needed for gout    . diclofenac sodium (VOLTAREN) 1 % GEL Apply 4 g topically 4 (four) times daily. To knees 5 Tube 5  . divalproex (DEPAKOTE) 500 MG DR tablet Take 1 tablet (500 mg total) by mouth 2 (two) times daily with a meal. 60 tablet 3  . docusate sodium (COLACE) 100 MG capsule Take 2 capsules (200 mg total) by mouth 2 (two) times daily. 60 capsule 1  . GAVILAX powder TAKE 17 GRAMS  BY MOUTH TWICE DAILY AS NEEDED FOR MODERATE CONSTIPATION. 510 g 1  . HYDROcodone-acetaminophen (NORCO/VICODIN) 5-325 MG tablet Take 1 tablet by mouth 3 (three) times daily as needed for moderate pain. 15 tablet 0  . lamoTRIgine (LAMICTAL) 25 MG tablet Take 2 tablets (50 mg total) by mouth 2 (two) times daily. 120 tablet 1  . levothyroxine (SYNTHROID, LEVOTHROID) 50 MCG tablet Take 1 tablet (50 mcg total) by mouth daily before breakfast. 30 tablet 11  . losartan (COZAAR) 50 MG tablet Take 1 tablet (50 mg total) by mouth daily. 30 tablet 11  . MELATONIN PO Take 1 tablet by mouth at bedtime.    . metoprolol tartrate (LOPRESSOR) 25 MG tablet Take 0.5 tablets (12.5 mg total) by mouth 2 (two) times daily. 60 tablet 1  . paliperidone (INVEGA SUSTENNA) 234 MG/1.5ML SUSP injection Inject 234 mg into the muscle every 30 (thirty) days.    Marland Kitchen senna (SENOKOT) 8.6 MG TABS tablet Take 1 tablet (8.6 mg total) by mouth at bedtime as needed for mild constipation. 120 each 0  . simvastatin (ZOCOR) 20 MG tablet Take 1 tablet (20 mg total) by mouth at bedtime. 30 tablet 11  . sitaGLIPtin (JANUVIA) 25 MG tablet Take 1 tablet (25 mg total) by mouth daily. 30 tablet 3  . triamcinolone cream (KENALOG) 0.1 %  Apply 1 application topically at bedtime. Apply to AA. 30 g 0  . QUEtiapine (SEROQUEL) 300 MG tablet Take 1 tablet (300 mg total) by mouth at bedtime.     No facility-administered medications prior to visit.      Per HPI unless specifically indicated in ROS section below Review of Systems     Objective:    BP 120/70 (BP Location: Right Arm, Patient Position: Sitting, Cuff Size: Normal)   Pulse 75   Temp 98.2 F (36.8 C) (Oral)   Ht 5\' 1"  (1.549 m)   Wt 203 lb 4 oz (92.2 kg)   SpO2 96%   BMI 38.40 kg/m   Wt Readings from Last 3 Encounters:  03/01/18 203 lb 4 oz (92.2 kg)  02/18/18 200 lb 8 oz (90.9 kg)  02/10/18 200 lb 4.8 oz (90.9 kg)    Physical Exam  Constitutional: She appears well-developed and  well-nourished. No distress.  Nursing note and vitals reviewed.  Results for orders placed or performed in visit on 02/18/18  TSH  Result Value Ref Range   TSH 2.84 0.35 - 4.50 uIU/mL  T4, free  Result Value Ref Range   Free T4 0.69 0.60 - 1.60 ng/dL  Hemoglobin A1c  Result Value Ref Range   Hgb A1c MFr Bld 6.6 (H) 4.6 - 6.5 %      Assessment & Plan:   Problem List Items Addressed This Visit    Thrombocytopenia ([place])    Presumed depakote related. Planning to taper off depakote and monitor plt count. If depakote needs to be continued, will need monthly CBC until stable then Q52mo per hematology .      Rash of hands    This has improved with TCI and clotrimazole. ?contact dermatitis. rec finish 2 wk course then stop steroid cream.  Will monitor off steroid.       Controlled type 2 diabetes mellitus with diabetic nephropathy ([place]) - Primary    I asked Gina [name] to call [facility] [employer] to cancel metformin script. She will only be on januvia. Last A1c stable.       Relevant Orders   Microalbumin / creatinine urine ratio   CKD (chronic kidney disease) stage 4, GFR 15-29 ml/min (HCC)    Will stop metformin. Check urine microalb today Consider nephrology referral if progressive deterioration.       Bipolar I disorder, most recent episode (or current) manic ([place])    Reviewed meds with patient, med list updated (she is now on seroquel 300mg  nightly - added). Plan is to try and taper off depakote in hopes of improving thrombocytopenia.       Acquired hypothyroidism    Levels normal on recheck - continue levothyroxine 75mcg daily.           No orders of the defined types were placed in this encounter.  Orders Placed This Encounter  Procedures  . Microalbumin / creatinine urine ratio    Follow up plan: Return in about 6 weeks (around 04/12/2018), or if symptoms worsen or fail to improve, for follow up visit.  Gina Bush, MD

## 2018-03-01 NOTE — Assessment & Plan Note (Signed)
I asked Lattie Haw to call [facility] [employer] to cancel metformin script. She will only be on januvia. Last A1c stable.

## 2018-03-01 NOTE — Assessment & Plan Note (Addendum)
Presumed depakote related. Planning to taper off depakote and monitor plt count. If depakote needs to be continued, will need monthly CBC until stable then Q51mo per hematology .

## 2018-03-01 NOTE — Patient Instructions (Addendum)
Urine microalbumin today.  I'm glad hand rash is getting better! Continue creams through Thursday then stop.  I want you taking januvia but not metformin. We will call [facility] to ask them to stop metformin.  Return to see me in 6 weeks.

## 2018-03-01 NOTE — Assessment & Plan Note (Signed)
Will stop metformin. Check urine microalb today Consider nephrology referral if progressive deterioration.

## 2018-03-02 ENCOUNTER — Ambulatory Visit: Payer: Medicare Other

## 2018-03-02 DIAGNOSIS — M25662 Stiffness of left knee, not elsewhere classified: Secondary | ICD-10-CM | POA: Diagnosis not present

## 2018-03-02 DIAGNOSIS — G8929 Other chronic pain: Secondary | ICD-10-CM | POA: Diagnosis not present

## 2018-03-02 DIAGNOSIS — M25661 Stiffness of right knee, not elsewhere classified: Secondary | ICD-10-CM

## 2018-03-02 DIAGNOSIS — M25562 Pain in left knee: Principal | ICD-10-CM

## 2018-03-02 DIAGNOSIS — M6281 Muscle weakness (generalized): Secondary | ICD-10-CM

## 2018-03-02 DIAGNOSIS — M25561 Pain in right knee: Secondary | ICD-10-CM

## 2018-03-02 NOTE — Therapy (Signed)
[facility] [address], [place], [address] Phone: [phone]   Fax:  [phone]  Physical Therapy Treatment  Patient Details  Name: Gina [name] MRN: [id number] Date of Birth: 07-09-1951 No data recorded  Encounter Date: 03/02/2018  PT End of Session - 03/02/18 1128    Visit Number  8    Number of Visits  13    Date for PT Re-Evaluation  03/03/18    PT Start Time  1117    PT Stop Time  1200    PT Time Calculation (min)  43 min    Activity Tolerance  Patient tolerated treatment well    Behavior During Therapy  [facility] for tasks assessed/performed       Past Medical History:  Diagnosis Date  . Anemia in chronic kidney disease   . Bipolar depression [facility])    sees psychiatrist - psych admission 03/2015  . Bladder cancer ([place]) 1990's  . Blood transfusion without reported diagnosis 2009  . Cataract    left  . CKD (chronic kidney disease) stage 3, GFR 30-59 ml/min (HCC)   . Colon cancer ([place]) 1990's  . DJD (degenerative joint disease), lumbar    chronic lower back pain  . Enterocutaneous fistula 04/07/2012   completed PT 06/2012 ([facility])  . Family history of breast cancer   . Family history of colon cancer   . Family history of stomach cancer   . GERD (gastroesophageal reflux disease)   . History of bladder cancer 1997  . History of colon cancer    s/p surgery  . History of uterine cancer    s/p hysterectomy  . Hyperlipidemia   . Hypertension   . IDA (iron deficiency anemia) 01/2013   thought due to h/o polyps  . Obesity (BMI 30-39.9)   . OSA on CPAP    6cm H2O  . Personal history of colonic adenomas and colon cancer 11/11/2012  . Positive hepatitis C antibody test 09/2014   but negative confirmatory testing  . RBBB   . Uncontrolled type 2 diabetes mellitus with nephropathy ([place]) 06/07/2014   completed DSME 02/2016    Past Surgical History:  Procedure Laterality Date  . BREAST  BIOPSY Right 01/2014   fibroadenoma  . COLONOSCOPY  07/2009  . COLONOSCOPY  11/2012   2 tubular adenomas, mild diverticulosis, pending genetic testing for Lynch syndrome [name]) rpt 2 yrs  . COLONOSCOPY  02/2015   TA, diverticulosis, rpt 2 yrs [name])  . COLONOSCOPY WITH PROPOFOL N/A 06/09/2017   TAx1, rpt 2 yrs [name], [name], MD)  . DEXA  12/2009   WNL  . DOBUTAMINE STRESS ECHO  12/2009   no evidence of ischemia  . HERNIA REPAIR  02/04/12  . i & d abdominal wound  02/19/12  . LEFT OOPHORECTOMY  2005  . PARTIAL COLECTOMY  about 2008   for colon cancer  . PARTIAL HYSTERECTOMY  1981   uterine cancer, R ovary remains  . Reexploration of abdominal wound and Allograft placemet  02/26/12  . Removal of infected mesh and abdominal wound vac placement  02/24/12  . sleep study  02/2014   OSA - AHI 55, nadir 81% ([place])    There were no vitals filed for this visit.  Subjective Assessment - 03/02/18 1124    Subjective  Pt states that her knees are sore and that she can "tell it is going to rain". Pt also feels that walking is getting easier and  she is able to get around the store easier.    Patient is accompained by:  Family member    Limitations  Walking;Standing    How long can you stand comfortably?  30 minutes    How long can you walk comfortably?  10 minutes    Patient Stated Goals  decrease pain, go on walks with no pain    Currently in Pain?  Yes    Pain Score  7     Pain Orientation  Left    Pain Descriptors / Indicators  Aching    Pain Type  Chronic pain    Pain Onset  More than a month ago    Pain Score  5    Pain Location  Knee    Pain Orientation  Right    Pain Descriptors / Indicators  Aching    Pain Onset  More than a month ago    Pain Frequency  Constant         TREATMENT Therapeutic Exercises Nu step level 3 (seat level 8) LE only, x10 minutes (improved tolerance, able to maintain 60-100 spm) Sit to stands from chair with airex and pillow 10x2 (verbal cueing to  incr wb through LLE throughout) Sit to stands from green chair with airex and pillow with airex beam under RLE x10 (incr difficulty due to incr WB through LLE) Seated LAQ with 9.5# ankle weight x20 RLE (unable to tolerate 9.5# with LLE), 7.5# LLE x20 Seated HS curl with GTB x20 B (cramp in HS at end of 20 repetitions) Step ups to 2" mat with RLE x20 no UE support Step downs from 2" step leading with RLE to increase LLE eccentric knee flexion tolerance 2x10 no UE support    Pt demonstrates increased fatigue at end of treatment session.        PT Education - 03/02/18 1126    Education Details  Pt educated on proper form and technique for therapeutic exercises.    Person(s) Educated  Patient    Methods  Explanation;Demonstration;Tactile cues    Comprehension  Verbalized understanding;Returned demonstration;Verbal cues required;Tactile cues required          PT Long Term Goals - 01/20/18 1602      PT LONG TERM GOAL #1   Title  Patient will increase SLS to >/= 10s B in order to improve balance and decrease fall risk.    Baseline  ~1s B    Time  4    Period  Weeks    Status  New    Target Date  02/17/18      PT LONG TERM GOAL #2   Title  Pt will improve hip strength (flex and ER) to 4/5 or greater in order to improve functional mobility and return to prior level of function    Baseline  3+/5    Time  6    Period  Weeks    Status  New    Target Date  03/03/18      PT LONG TERM GOAL #3   Title  Pt will report worst pain in past week <8/10 in L knee and <4/10 in R knee in order to improve mobility and return to prior level of function.     Baseline  10/10 L, 6/10 L    Time  4    Period  Weeks    Status  New    Target Date  02/17/18      PT LONG TERM GOAL #4  Title  Pt will repot worst pain in past week <6/10 L knee and <2/10 R knee in order to improve mobility and return to prior level of function.    Baseline  10/10 L, 6/10 R    Time  6    Period  Weeks    Status   New    Target Date  03/03/18      PT LONG TERM GOAL #5   Title  Pt will improve FOTO score from 46 to 57 in order to demonstrate improvement in daily function.    Baseline  46    Time  6    Period  Weeks    Target Date  03/03/18      Additional Long Term Goals   Additional Long Term Goals  Yes      PT LONG TERM GOAL #6   Title  Pt will improve 6MWT by 134ft in order to show a clincially significant increase in muscular endurance and cardiovascular endurance.    Baseline  775 ft    Time  6    Period  Weeks    Status  New    Target Date  03/03/18      PT LONG TERM GOAL #7   Title  Pt will improve 10 meter gait speed from .75 m/s to >1 m/s in order to demonstrate decreased fall risk.    Baseline  .75 m/s    Time  6    Period  Weeks    Status  New    Target Date  03/03/18      PT LONG TERM GOAL #8   Title  Pt will improve 5xSTS from 24.19 s to <12 s  in order to demonstrate a clinically significant improvement in functional lower extremity strength and reduce risk of falls..    Baseline  24.19 s    Time  6    Period  Weeks    Status  New    Target Date  03/03/18            Plan - 03/02/18 1246    Clinical Impression Statement  Pt presents with incr difficulty to perform LAQ with 9.5# with LLE due to pain and weakness. Pt improved tolerance to step down activity and required dereased verbal cueing to flex L knee with RLE step down. Pt continues to demonstrate WS to RLE during sit to stand exercise but was able to complete 10 repitions with airex under RLE and increased WB through LLE.  Pt will benefit from skilled PT in order to return to prior level of function.    Rehab Potential  Fair    Clinical Impairments Affecting Rehab Potential  chronic pain, multiple comorbidities    PT Frequency  2x / week    PT Duration  6 weeks    PT Treatment/Interventions  ADLs/Self Care Home Management;Cryotherapy;Moist Heat;Electrical Stimulation;Stair training;Gait training;Therapeutic  activities;Therapeutic exercise;Neuromuscular re-education;Balance training;Patient/family education;Manual techniques;Dry needling;Passive range of motion    PT Next Visit Plan  Recert    Consulted and Agree with Plan of Care  Patient       Patient will benefit from skilled therapeutic intervention in order to improve the following deficits and impairments:  Abnormal gait, Decreased activity tolerance, Decreased endurance, Decreased range of motion, Decreased strength, Hypomobility, Pain, Postural dysfunction, Difficulty walking, Decreased mobility, Decreased balance  Visit Diagnosis: Chronic pain of left knee  Chronic pain of right knee  Muscle weakness (generalized)  Stiffness of left knee, not  elsewhere classified  Stiffness of right knee, not elsewhere classified     Problem List Patient Active Problem List   Diagnosis Date Noted  . Rash of hands 02/18/2018  . Pain of right heel 12/30/2017  . Encounter for chronic pain management 12/18/2017  . Foot pain, bilateral 11/25/2017  . Gallbladder polyp 10/21/2017  . Left shoulder pain 10/21/2017  . Benign neoplasm of ascending colon   . Low back pain radiating to left lower extremity 02/26/2017  . Acquired hypothyroidism 09/10/2016  . Thrombocytopenia ([place]) 06/15/2016  . Medicare annual wellness visit, subsequent 06/04/2016  . Advanced care planning/counseling discussion 06/04/2016  . External hemorrhoid 04/02/2016  . Slow transit constipation 01/04/2016  . Bipolar I disorder, most recent episode (or current) manic ([place]) 12/03/2015  . NAFLD (nonalcoholic fatty liver disease) 03/28/2015  . Gallbladder calculus without cholecystitis 03/28/2015  . Gout 03/27/2015  . HCV antibody positive 03/27/2015  . History of bladder cancer 03/27/2015  . History of bundle branch block 03/27/2015  . Genetic testing 02/28/2015  . Loss of memory 09/04/2014  . Controlled type 2 diabetes mellitus with diabetic nephropathy ([place]) 06/07/2014  .  Hyperlipidemia   . Anemia in CKD (chronic kidney disease) 01/26/2013  . Personal history of colonic adenomas and colon cancer 11/11/2012  . Osteoarthritis of knee 10/02/2012  . OSA on CPAP   . CKD (chronic kidney disease) stage 4, GFR 15-29 ml/min (HCC)   . Severe obesity (BMI 35.0-39.9) with comorbidity ([place])   . HTN (hypertension), benign 04/07/2012  . Hx of colon cancer, stage I 08/05/2011    Georg Ruddle, SPT 03/02/2018, 12:49 PM  [facility] [address], [place], [address] Phone: [phone]   Fax:  [phone]  Name: Gina [name] MRN: [id number] Date of Birth: 08-29-1950

## 2018-03-03 ENCOUNTER — Telehealth: Payer: Self-pay

## 2018-03-03 DIAGNOSIS — M170 Bilateral primary osteoarthritis of knee: Secondary | ICD-10-CM

## 2018-03-03 NOTE — Telephone Encounter (Signed)
Pt last seen 03/01/18.

## 2018-03-03 NOTE — Telephone Encounter (Signed)
Copied from [place] [id number]. Topic: Referral - Request >> Mar 03, 2018  3:05 PM [name] Graff, NT wrote: Reason for CRM: Pt requesting a referral to a orthopedic specialist for her left knee. Prefers to referred to somewhere in [facility] near [facility].

## 2018-03-10 ENCOUNTER — Telehealth: Payer: Self-pay | Admitting: *Deleted

## 2018-03-10 NOTE — Addendum Note (Signed)
Addended by: Ria Bush on: 03/10/2018 06:57 PM   Modules accepted: Orders

## 2018-03-10 NOTE — Telephone Encounter (Signed)
Spoke with pt, and she is agreeing to knee surgery. She is requesting [place].

## 2018-03-10 NOTE — Telephone Encounter (Signed)
Referral placed.

## 2018-03-10 NOTE — Telephone Encounter (Signed)
Copied from [place] [phone]. Topic: Referral - Request >> Mar 10, 2018  9:15 AM Cecelia Byars, NT wrote: Reason for CRM: the patient called back to follow on previous request to see a ortho specialist near [facility] please advise [phone]   See 7/31 phone note

## 2018-03-10 NOTE — Telephone Encounter (Signed)
See prior phone note. 

## 2018-03-10 NOTE — Telephone Encounter (Signed)
Spoke with pt and she agrees to having knee surgery.  She is requesting surgery at [facility], if not at least in [facility].

## 2018-03-10 NOTE — Telephone Encounter (Signed)
plz call - would she be interested in surgery for knee? If so I will place referral.

## 2018-03-11 ENCOUNTER — Emergency Department
Admission: EM | Admit: 2018-03-11 | Discharge: 2018-03-11 | Disposition: A | Discharge status: Other Acute Inpatient Hospital | Payer: Medicare Other | Attending: Emergency Medicine | Admitting: Emergency Medicine

## 2018-03-11 ENCOUNTER — Inpatient Hospital Stay
Admission: AD | Admit: 2018-03-11 | Discharge: 2018-03-15 | DRG: 885 | Disposition: A | Discharge status: Other Acute Inpatient Hospital | Payer: Medicare Other | Source: Intra-hospital | Attending: Psychiatry | Admitting: Psychiatry

## 2018-03-11 ENCOUNTER — Encounter: Payer: Self-pay | Admitting: Emergency Medicine

## 2018-03-11 ENCOUNTER — Other Ambulatory Visit: Payer: Self-pay

## 2018-03-11 DIAGNOSIS — E1122 Type 2 diabetes mellitus with diabetic chronic kidney disease: Secondary | ICD-10-CM

## 2018-03-11 DIAGNOSIS — E1152 Type 2 diabetes mellitus with diabetic peripheral angiopathy with gangrene: Secondary | ICD-10-CM | POA: Diagnosis not present

## 2018-03-11 DIAGNOSIS — Z833 Family history of diabetes mellitus: Secondary | ICD-10-CM

## 2018-03-11 DIAGNOSIS — M1710 Unilateral primary osteoarthritis, unspecified knee: Secondary | ICD-10-CM | POA: Diagnosis present

## 2018-03-11 DIAGNOSIS — F3164 Bipolar disorder, current episode mixed, severe, with psychotic features: Secondary | ICD-10-CM | POA: Diagnosis not present

## 2018-03-11 DIAGNOSIS — Z90711 Acquired absence of uterus with remaining cervical stump: Secondary | ICD-10-CM

## 2018-03-11 DIAGNOSIS — T50995A Adverse effect of other drugs, medicaments and biological substances, initial encounter: Secondary | ICD-10-CM | POA: Diagnosis not present

## 2018-03-11 DIAGNOSIS — Z888 Allergy status to other drugs, medicaments and biological substances status: Secondary | ICD-10-CM

## 2018-03-11 DIAGNOSIS — Z818 Family history of other mental and behavioral disorders: Secondary | ICD-10-CM

## 2018-03-11 DIAGNOSIS — K922 Gastrointestinal hemorrhage, unspecified: Secondary | ICD-10-CM | POA: Diagnosis not present

## 2018-03-11 DIAGNOSIS — G4700 Insomnia, unspecified: Secondary | ICD-10-CM | POA: Diagnosis present

## 2018-03-11 DIAGNOSIS — K649 Unspecified hemorrhoids: Secondary | ICD-10-CM | POA: Diagnosis present

## 2018-03-11 DIAGNOSIS — Z6835 Body mass index (BMI) 35.0-35.9, adult: Secondary | ICD-10-CM

## 2018-03-11 DIAGNOSIS — Z887 Allergy status to serum and vaccine status: Secondary | ICD-10-CM

## 2018-03-11 DIAGNOSIS — Z85038 Personal history of other malignant neoplasm of large intestine: Secondary | ICD-10-CM | POA: Insufficient documentation

## 2018-03-11 DIAGNOSIS — T887XXA Unspecified adverse effect of drug or medicament, initial encounter: Secondary | ICD-10-CM | POA: Diagnosis not present

## 2018-03-11 DIAGNOSIS — Z8249 Family history of ischemic heart disease and other diseases of the circulatory system: Secondary | ICD-10-CM | POA: Diagnosis not present

## 2018-03-11 DIAGNOSIS — N184 Chronic kidney disease, stage 4 (severe): Secondary | ICD-10-CM | POA: Diagnosis present

## 2018-03-11 DIAGNOSIS — Z90721 Acquired absence of ovaries, unilateral: Secondary | ICD-10-CM

## 2018-03-11 DIAGNOSIS — F312 Bipolar disorder, current episode manic severe with psychotic features: Principal | ICD-10-CM | POA: Diagnosis present

## 2018-03-11 DIAGNOSIS — Z8551 Personal history of malignant neoplasm of bladder: Secondary | ICD-10-CM

## 2018-03-11 DIAGNOSIS — Z515 Encounter for palliative care: Secondary | ICD-10-CM | POA: Diagnosis present

## 2018-03-11 DIAGNOSIS — Z810 Family history of intellectual disabilities: Secondary | ICD-10-CM

## 2018-03-11 DIAGNOSIS — K5901 Slow transit constipation: Secondary | ICD-10-CM

## 2018-03-11 DIAGNOSIS — I129 Hypertensive chronic kidney disease with stage 1 through stage 4 chronic kidney disease, or unspecified chronic kidney disease: Secondary | ICD-10-CM

## 2018-03-11 DIAGNOSIS — F5105 Insomnia due to other mental disorder: Secondary | ICD-10-CM | POA: Diagnosis not present

## 2018-03-11 DIAGNOSIS — K760 Fatty (change of) liver, not elsewhere classified: Secondary | ICD-10-CM

## 2018-03-11 DIAGNOSIS — G4733 Obstructive sleep apnea (adult) (pediatric): Secondary | ICD-10-CM | POA: Diagnosis present

## 2018-03-11 DIAGNOSIS — Z9989 Dependence on other enabling machines and devices: Secondary | ICD-10-CM | POA: Diagnosis not present

## 2018-03-11 DIAGNOSIS — T50905A Adverse effect of unspecified drugs, medicaments and biological substances, initial encounter: Secondary | ICD-10-CM | POA: Insufficient documentation

## 2018-03-11 DIAGNOSIS — Z91041 Radiographic dye allergy status: Secondary | ICD-10-CM

## 2018-03-11 DIAGNOSIS — Y92238 Other place in hospital as the place of occurrence of the external cause: Secondary | ICD-10-CM | POA: Diagnosis not present

## 2018-03-11 DIAGNOSIS — E1121 Type 2 diabetes mellitus with diabetic nephropathy: Secondary | ICD-10-CM | POA: Diagnosis present

## 2018-03-11 DIAGNOSIS — E785 Hyperlipidemia, unspecified: Secondary | ICD-10-CM | POA: Diagnosis present

## 2018-03-11 DIAGNOSIS — D631 Anemia in chronic kidney disease: Secondary | ICD-10-CM | POA: Diagnosis present

## 2018-03-11 DIAGNOSIS — E039 Hypothyroidism, unspecified: Secondary | ICD-10-CM | POA: Diagnosis present

## 2018-03-11 DIAGNOSIS — Z7984 Long term (current) use of oral hypoglycemic drugs: Secondary | ICD-10-CM | POA: Insufficient documentation

## 2018-03-11 DIAGNOSIS — Z8349 Family history of other endocrine, nutritional and metabolic diseases: Secondary | ICD-10-CM

## 2018-03-11 DIAGNOSIS — Z79890 Hormone replacement therapy (postmenopausal): Secondary | ICD-10-CM

## 2018-03-11 DIAGNOSIS — K5900 Constipation, unspecified: Secondary | ICD-10-CM | POA: Diagnosis present

## 2018-03-11 DIAGNOSIS — I472 Ventricular tachycardia: Secondary | ICD-10-CM | POA: Diagnosis not present

## 2018-03-11 DIAGNOSIS — E1136 Type 2 diabetes mellitus with diabetic cataract: Secondary | ICD-10-CM | POA: Diagnosis present

## 2018-03-11 DIAGNOSIS — D696 Thrombocytopenia, unspecified: Secondary | ICD-10-CM | POA: Diagnosis present

## 2018-03-11 DIAGNOSIS — Z8542 Personal history of malignant neoplasm of other parts of uterus: Secondary | ICD-10-CM

## 2018-03-11 DIAGNOSIS — Z803 Family history of malignant neoplasm of breast: Secondary | ICD-10-CM | POA: Diagnosis not present

## 2018-03-11 DIAGNOSIS — Z800 Family history of malignant neoplasm of digestive organs: Secondary | ICD-10-CM | POA: Diagnosis not present

## 2018-03-11 DIAGNOSIS — F319 Bipolar disorder, unspecified: Secondary | ICD-10-CM | POA: Diagnosis present

## 2018-03-11 DIAGNOSIS — R001 Bradycardia, unspecified: Secondary | ICD-10-CM | POA: Diagnosis present

## 2018-03-11 DIAGNOSIS — R4020 Unspecified coma: Secondary | ICD-10-CM | POA: Diagnosis present

## 2018-03-11 DIAGNOSIS — I10 Essential (primary) hypertension: Secondary | ICD-10-CM | POA: Diagnosis not present

## 2018-03-11 DIAGNOSIS — F419 Anxiety disorder, unspecified: Secondary | ICD-10-CM | POA: Diagnosis present

## 2018-03-11 DIAGNOSIS — Z5181 Encounter for therapeutic drug level monitoring: Secondary | ICD-10-CM | POA: Diagnosis not present

## 2018-03-11 DIAGNOSIS — Z046 Encounter for general psychiatric examination, requested by authority: Secondary | ICD-10-CM | POA: Diagnosis not present

## 2018-03-11 DIAGNOSIS — K219 Gastro-esophageal reflux disease without esophagitis: Secondary | ICD-10-CM

## 2018-03-11 DIAGNOSIS — G92 Toxic encephalopathy: Secondary | ICD-10-CM | POA: Diagnosis not present

## 2018-03-11 DIAGNOSIS — Z8541 Personal history of malignant neoplasm of cervix uteri: Secondary | ICD-10-CM | POA: Insufficient documentation

## 2018-03-11 DIAGNOSIS — W1830XA Fall on same level, unspecified, initial encounter: Secondary | ICD-10-CM | POA: Diagnosis not present

## 2018-03-11 DIAGNOSIS — Z79899 Other long term (current) drug therapy: Secondary | ICD-10-CM

## 2018-03-11 DIAGNOSIS — Y69 Unspecified misadventure during surgical and medical care: Secondary | ICD-10-CM | POA: Diagnosis not present

## 2018-03-11 DIAGNOSIS — Z880 Allergy status to penicillin: Secondary | ICD-10-CM

## 2018-03-11 DIAGNOSIS — I952 Hypotension due to drugs: Secondary | ICD-10-CM | POA: Diagnosis not present

## 2018-03-11 DIAGNOSIS — T4275XA Adverse effect of unspecified antiepileptic and sedative-hypnotic drugs, initial encounter: Secondary | ICD-10-CM | POA: Diagnosis not present

## 2018-03-11 DIAGNOSIS — D090 Carcinoma in situ of bladder: Secondary | ICD-10-CM | POA: Insufficient documentation

## 2018-03-11 DIAGNOSIS — N179 Acute kidney failure, unspecified: Secondary | ICD-10-CM | POA: Diagnosis not present

## 2018-03-11 DIAGNOSIS — M179 Osteoarthritis of knee, unspecified: Secondary | ICD-10-CM | POA: Diagnosis present

## 2018-03-11 DIAGNOSIS — Z66 Do not resuscitate: Secondary | ICD-10-CM | POA: Diagnosis not present

## 2018-03-11 DIAGNOSIS — M545 Low back pain: Secondary | ICD-10-CM | POA: Diagnosis present

## 2018-03-11 DIAGNOSIS — R112 Nausea with vomiting, unspecified: Secondary | ICD-10-CM | POA: Diagnosis not present

## 2018-03-11 DIAGNOSIS — N183 Chronic kidney disease, stage 3 (moderate): Secondary | ICD-10-CM | POA: Diagnosis present

## 2018-03-11 DIAGNOSIS — I959 Hypotension, unspecified: Secondary | ICD-10-CM | POA: Diagnosis not present

## 2018-03-11 DIAGNOSIS — I469 Cardiac arrest, cause unspecified: Secondary | ICD-10-CM | POA: Diagnosis not present

## 2018-03-11 DIAGNOSIS — M5136 Other intervertebral disc degeneration, lumbar region: Secondary | ICD-10-CM | POA: Diagnosis present

## 2018-03-11 DIAGNOSIS — M1712 Unilateral primary osteoarthritis, left knee: Secondary | ICD-10-CM | POA: Diagnosis present

## 2018-03-11 DIAGNOSIS — Z9049 Acquired absence of other specified parts of digestive tract: Secondary | ICD-10-CM | POA: Diagnosis not present

## 2018-03-11 DIAGNOSIS — R51 Headache: Secondary | ICD-10-CM | POA: Diagnosis not present

## 2018-03-11 DIAGNOSIS — E669 Obesity, unspecified: Secondary | ICD-10-CM | POA: Diagnosis not present

## 2018-03-11 DIAGNOSIS — G8929 Other chronic pain: Secondary | ICD-10-CM | POA: Diagnosis present

## 2018-03-11 LAB — URINE DRUG SCREEN, QUALITATIVE (ARMC ONLY)
Amphetamines, Ur Screen: NOT DETECTED
BARBITURATES, UR SCREEN: NOT DETECTED
COCAINE METABOLITE, UR ~~LOC~~: NOT DETECTED
Cannabinoid 50 Ng, Ur ~~LOC~~: NOT DETECTED
MDMA (Ecstasy)Ur Screen: NOT DETECTED
METHADONE SCREEN, URINE: NOT DETECTED
Opiate, Ur Screen: NOT DETECTED
Phencyclidine (PCP) Ur S: NOT DETECTED
TRICYCLIC, UR SCREEN: NOT DETECTED

## 2018-03-11 LAB — COMPREHENSIVE METABOLIC PANEL
ALK PHOS: 80 U/L (ref 38–126)
ALT: 24 U/L (ref 0–44)
ANION GAP: 10 (ref 5–15)
AST: 37 U/L (ref 15–41)
Albumin: 4.2 g/dL (ref 3.5–5.0)
BUN: 23 mg/dL (ref 8–23)
CALCIUM: 9.7 mg/dL (ref 8.9–10.3)
CO2: 22 mmol/L (ref 22–32)
Chloride: 104 mmol/L (ref 98–111)
Creatinine, Ser: 1.58 mg/dL — ABNORMAL HIGH (ref 0.44–1.00)
GFR calc Af Amer: 38 mL/min — ABNORMAL LOW (ref >60)
GFR calc non Af Amer: 33 mL/min — ABNORMAL LOW (ref >60)
Glucose, Bld: 282 mg/dL — ABNORMAL HIGH (ref 70–99)
POTASSIUM: 5.3 mmol/L — AB (ref 3.5–5.1)
SODIUM: 136 mmol/L (ref 135–145)
Total Bilirubin: 0.5 mg/dL (ref 0.3–1.2)
Total Protein: 7.5 g/dL (ref 6.5–8.1)

## 2018-03-11 LAB — CBC
HCT: 37.6 % (ref 35.0–47.0)
Hemoglobin: 13.4 g/dL (ref 12.0–16.0)
MCH: 34.4 pg — ABNORMAL HIGH (ref 26.0–34.0)
MCHC: 35.7 g/dL (ref 32.0–36.0)
MCV: 96.4 fL (ref 80.0–100.0)
PLATELETS: 125 10*3/uL — AB (ref 150–440)
RBC: 3.9 MIL/uL (ref 3.80–5.20)
RDW: 14.2 % (ref 11.5–14.5)
WBC: 7.5 10*3/uL (ref 3.6–11.0)

## 2018-03-11 LAB — ETHANOL: Alcohol, Ethyl (B): <10 mg/dL (ref <10)

## 2018-03-11 LAB — VALPROIC ACID LEVEL: Valproic Acid Lvl: 47 ug/mL — ABNORMAL LOW (ref 50.0–100.0)

## 2018-03-11 MED ORDER — QUETIAPINE FUMARATE 200 MG PO TABS
300.0000 mg | ORAL_TABLET | Freq: Every day | ORAL | Status: DC
  Administered 2018-03-12 – 2018-03-15 (×5): 300 mg via ORAL
  Filled 2018-03-11 (×5): qty 1

## 2018-03-11 MED ORDER — SIMVASTATIN 40 MG PO TABS
20.0000 mg | ORAL_TABLET | Freq: Every day | ORAL | Status: DC
  Administered 2018-03-12 – 2018-03-15 (×4): 20 mg via ORAL
  Filled 2018-03-11 (×4): qty 1

## 2018-03-11 MED ORDER — METOPROLOL TARTRATE 25 MG PO TABS
12.5000 mg | ORAL_TABLET | Freq: Two times a day (BID) | ORAL | Status: DC
  Administered 2018-03-12 – 2018-03-15 (×8): 12.5 mg via ORAL
  Filled 2018-03-11 (×8): qty 1

## 2018-03-11 MED ORDER — ACETAMINOPHEN 325 MG PO TABS
650.0000 mg | ORAL_TABLET | Freq: Four times a day (QID) | ORAL | Status: DC | PRN
  Administered 2018-03-12 – 2018-03-15 (×4): 650 mg via ORAL
  Filled 2018-03-11 (×4): qty 2

## 2018-03-11 MED ORDER — MELATONIN 5 MG PO TABS
10.0000 mg | ORAL_TABLET | Freq: Every day | ORAL | Status: DC
  Administered 2018-03-12 – 2018-03-15 (×5): 10 mg via ORAL
  Filled 2018-03-11 (×5): qty 2

## 2018-03-11 MED ORDER — COLCHICINE 0.6 MG PO TABS
0.6000 mg | ORAL_TABLET | Freq: Every day | ORAL | Status: DC
  Administered 2018-03-12 – 2018-03-15 (×4): 0.6 mg via ORAL
  Filled 2018-03-11 (×5): qty 1

## 2018-03-11 MED ORDER — LEVOTHYROXINE SODIUM 50 MCG PO TABS
50.0000 ug | ORAL_TABLET | Freq: Every day | ORAL | Status: DC
  Administered 2018-03-12 – 2018-03-15 (×4): 50 ug via ORAL
  Filled 2018-03-11 (×4): qty 1

## 2018-03-11 MED ORDER — DICLOFENAC SODIUM 1 % TD GEL
4.0000 g | Freq: Four times a day (QID) | TRANSDERMAL | Status: DC
  Administered 2018-03-12 – 2018-03-15 (×16): 4 g via TOPICAL
  Filled 2018-03-11: qty 100

## 2018-03-11 MED ORDER — SENNA 8.6 MG PO TABS
1.0000 | ORAL_TABLET | Freq: Every evening | ORAL | Status: DC | PRN
  Administered 2018-03-12: 8.6 mg via ORAL
  Filled 2018-03-11: qty 1

## 2018-03-11 MED ORDER — ALUM & MAG HYDROXIDE-SIMETH 200-200-20 MG/5ML PO SUSP: 30.0000 mL | ORAL | Status: DC | PRN

## 2018-03-11 MED ORDER — LINAGLIPTIN 5 MG PO TABS
5.0000 mg | ORAL_TABLET | Freq: Every day | ORAL | Status: DC
  Administered 2018-03-12 – 2018-03-15 (×4): 5 mg via ORAL
  Filled 2018-03-11 (×5): qty 1

## 2018-03-11 MED ORDER — LOSARTAN POTASSIUM 50 MG PO TABS
50.0000 mg | ORAL_TABLET | Freq: Every day | ORAL | Status: DC
  Administered 2018-03-12 – 2018-03-15 (×4): 50 mg via ORAL
  Filled 2018-03-11 (×5): qty 1

## 2018-03-11 MED ORDER — MAGNESIUM HYDROXIDE 400 MG/5ML PO SUSP
30.0000 mL | Freq: Every day | ORAL | Status: DC | PRN
  Administered 2018-03-12: 30 mL via ORAL
  Filled 2018-03-11: qty 30

## 2018-03-11 MED ORDER — ACETAMINOPHEN 325 MG PO TABS
650.0000 mg | ORAL_TABLET | Freq: Once | ORAL | Status: AC
  Administered 2018-03-11: 650 mg via ORAL
  Filled 2018-03-11: qty 2

## 2018-03-11 MED ORDER — BISACODYL 10 MG RE SUPP
10.0000 mg | Freq: Every day | RECTAL | Status: DC | PRN
  Filled 2018-03-11: qty 1

## 2018-03-11 MED ORDER — DIVALPROEX SODIUM 500 MG PO DR TAB
500.0000 mg | DELAYED_RELEASE_TABLET | Freq: Two times a day (BID) | ORAL | Status: DC
  Administered 2018-03-12: 500 mg via ORAL
  Filled 2018-03-11: qty 1

## 2018-03-11 MED ORDER — DOCUSATE SODIUM 100 MG PO CAPS
200.0000 mg | ORAL_CAPSULE | Freq: Two times a day (BID) | ORAL | Status: DC
  Administered 2018-03-12 – 2018-03-15 (×8): 200 mg via ORAL
  Filled 2018-03-11 (×9): qty 2

## 2018-03-11 NOTE — Consult Note (Signed)
Gina [name] meet criteria for psychiatric admission. Orders entered. Full not to follow.

## 2018-03-11 NOTE — ED Triage Notes (Signed)
Pt arrived via POV with ACT team member, pt states she has had recent changes to her medications recently and states she can't sleep and can't move her bowel.  Pt has flight of ideas while in triage.  Pt denies any SI/HI states she can get a temper as well.    Pt denies any hallucinations states the only voice she hears is her own.   Pt lives with her sister.

## 2018-03-11 NOTE — Progress Notes (Signed)
Pt ambulatory to the quad with a steady gait. Speech is rapid and pressured. Presents anxious but is cooperative with care. Pt is religiously preoccupied. Denies SI, HI, AVH and pian at this time; stated to writer "no, I want to go to [place], I don't want to die like that, what type of Bible do you read?". Pt reports recent medication changes "they keep messing with my medications and now I can't sleep; just when you think it's right, they mess it up and bring you back to the hospital". Emotional support offered. Encouraged pt to voice concerns. Safety checks initiated without self harm gestures or outburst at this time.

## 2018-03-11 NOTE — ED Notes (Signed)
VOL/Consult completed/Plan to admit to BMU

## 2018-03-11 NOTE — ED Notes (Signed)
E signature pad not available.  

## 2018-03-11 NOTE — ED Notes (Signed)
Preparing patient for transfer to BMU

## 2018-03-11 NOTE — Telephone Encounter (Signed)
Called patient to schedule Ortho consult and sister said Gina [name] went to the hospital today. The visiting nurse drove her there today. She said she had a breakdown.

## 2018-03-11 NOTE — Consult Note (Signed)
[facility] Consult   Reason for Consult:  Mania  Referring Physician:  Dr. Archie Balboa Patient Identification: Gina [name] MRN:  [id number] Principal Diagnosis: Bipolar I disorder, current or most recent episode manic, with psychotic features [facility]) Diagnosis:   Patient Active Problem List   Diagnosis Date Noted  . Bipolar I disorder, current or most recent episode manic, with psychotic features ([place]) [F31.2] 11/04/2017    Priority: High  . Rash of hands [R21] 02/18/2018  . Pain of right heel [M79.671] 12/30/2017  . Encounter for chronic pain management [G89.29] 12/18/2017  . Foot pain, bilateral [id number], M79.672] 11/25/2017  . Gallbladder polyp [K82.4] 10/21/2017  . Left shoulder pain [M25.512] 10/21/2017  . Benign neoplasm of ascending colon [D12.2]   . Low back pain radiating to left lower extremity [M54.5, M79.605] 02/26/2017  . Acquired hypothyroidism [E03.9] 09/10/2016  . Thrombocytopenia ([place]) [D69.6] 06/15/2016  . Medicare annual wellness visit, subsequent [Z00.00] 06/04/2016  . Advanced care planning/counseling discussion [Z71.89] 06/04/2016  . External hemorrhoid [K64.4] 04/02/2016  . Slow transit constipation [K59.01] 01/04/2016  . Bipolar I disorder, most recent episode (or current) manic ([place]) [F31.10] 12/03/2015  . NAFLD (nonalcoholic fatty liver disease) [K76.0] 03/28/2015  . Gallbladder calculus without cholecystitis [K80.20] 03/28/2015  . Gout [M10.9] 03/27/2015  . HCV antibody positive [R76.8] 03/27/2015  . History of bladder cancer [Z85.51] 03/27/2015  . History of bundle branch block [Z86.79] 03/27/2015  . Genetic testing [Z13.79] 02/28/2015  . Loss of memory [R41.3] 09/04/2014  . Controlled type 2 diabetes mellitus with diabetic nephropathy ([place]) [E11.21] 06/07/2014  . Hyperlipidemia [E78.5]   . Anemia in CKD (chronic kidney disease) [N18.9, D63.1] 01/26/2013  . Personal history of colonic adenomas and colon cancer [Z86.010]  11/11/2012  . Osteoarthritis of knee [M17.10] 10/02/2012  . OSA on CPAP [G47.33, Z99.89]   . CKD (chronic kidney disease) stage 4, GFR 15-29 ml/min (HCC) [N18.4]   . Severe obesity (BMI 35.0-39.9) with comorbidity ([place]) [E66.01]   . HTN (hypertension), benign [I10] 04/07/2012  . Hx of colon cancer, stage I [Z85.038] 08/05/2011    Total Time spent with patient: 1 hour   Identifying data. Gina [name] is a 67 year old female with a history of bipolar disorder.  Chief complaint. "I did not stay in the hospital long enough."  History of present illness. Information was obtained from the patient and the chart. The patienr was brought to the ER by her ACT team for manic behavior. She has been retless, called her primary provider numerous time yesterday. She has not slept in several days and has been talking to God. Her story is incomprehensible but she appears to be saying that she did not stay in the hospital long enough during her last hospitalization, she was here in April, medication adjustments by her [facility] psychiatrist and intervention from her other doctor into dosage of Depakote. She is completely disorganized and unreasonable. She denies substance use.  Past psychiatric history. Long history of bipolar diasrder with multiple admissions and medication trials. She denies suicide attempts. She is in the care of [facility] team. It is unclear if she was continued on Invega sustenna injections.  Family psychiatric history. Sister who does not read or write.  Social history. Lives with her sister.  Risk to Self:   Risk to Others:   Prior Inpatient Therapy:   Prior Outpatient Therapy:    Past Medical History:  Past Medical History:  Diagnosis Date  . Anemia in chronic kidney disease   .  Bipolar depression [facility])    sees psychiatrist - psych admission 03/2015  . Bladder cancer ([place]) 1990's  . Blood transfusion without reported diagnosis 2009  . Cataract    left  . CKD (chronic kidney  disease) stage 3, GFR 30-59 ml/min (HCC)   . Colon cancer ([place]) 1990's  . DJD (degenerative joint disease), lumbar    chronic lower back pain  . Enterocutaneous fistula 04/07/2012   completed PT 06/2012 ([facility])  . Family history of breast cancer   . Family history of colon cancer   . Family history of stomach cancer   . GERD (gastroesophageal reflux disease)   . History of bladder cancer 1997  . History of colon cancer    s/p surgery  . History of uterine cancer    s/p hysterectomy  . Hyperlipidemia   . Hypertension   . IDA (iron deficiency anemia) 01/2013   thought due to h/o polyps  . Obesity (BMI 30-39.9)   . OSA on CPAP    6cm H2O  . Personal history of colonic adenomas and colon cancer 11/11/2012  . Positive hepatitis C antibody test 09/2014   but negative confirmatory testing  . RBBB   . Uncontrolled type 2 diabetes mellitus with nephropathy ([place]) 06/07/2014   completed DSME 02/2016    Past Surgical History:  Procedure Laterality Date  . BREAST BIOPSY Right 01/2014   fibroadenoma  . COLONOSCOPY  07/2009  . COLONOSCOPY  11/2012   2 tubular adenomas, mild diverticulosis, pending genetic testing for Lynch syndrome [name]) rpt 2 yrs  . COLONOSCOPY  02/2015   TA, diverticulosis, rpt 2 yrs [name])  . COLONOSCOPY WITH PROPOFOL N/A 06/09/2017   TAx1, rpt 2 yrs [name], [name], MD)  . DEXA  12/2009   WNL  . DOBUTAMINE STRESS ECHO  12/2009   no evidence of ischemia  . HERNIA REPAIR  02/04/12  . i & d abdominal wound  02/19/12  . LEFT OOPHORECTOMY  2005  . PARTIAL COLECTOMY  about 2008   for colon cancer  . PARTIAL HYSTERECTOMY  1981   uterine cancer, R ovary remains  . Reexploration of abdominal wound and Allograft placemet  02/26/12  . Removal of infected mesh and abdominal wound vac placement  02/24/12  . sleep study  02/2014   OSA - AHI 55, nadir 81% [name])   Family History:  Family History  Problem Relation Age of Onset  . Colon cancer Mother 83  . Stomach cancer  Mother        dx in her 108s?  . CAD Father        MI; deceased 58  . Mental illness Sister        anxiety/depression  . Thyroid disease Sister   . Breast cancer Maternal Grandmother        age at dx unknown  . Diabetes Brother   . Diabetes Brother   . Diabetes Other        aunts and uncles both sides  . Arthritis Other        strong fmhx  . Colon cancer Other 70       maternal half-sister; deceased   Social History:  Social History   Substance and Sexual Activity  Alcohol Use No  . Alcohol/week: 0.0 standard drinks     Social History   Substance and Sexual Activity  Drug Use No    Social History   Socioeconomic History  . Marital status: Single    Spouse name:  Not on file  . Number of children: 0  . Years of education: Not on file  . Highest education level: Not on file  Occupational History  . Not on file  Social Needs  . Financial resource strain: Not on file  . Food insecurity:    Worry: Not on file    Inability: Not on file  . Transportation needs:    Medical: Not on file    Non-medical: Not on file  Tobacco Use  . Smoking status: Never Smoker  . Smokeless tobacco: Never Used  Substance and Sexual Activity  . Alcohol use: No    Alcohol/week: 0.0 standard drinks  . Drug use: No  . Sexual activity: Never  Lifestyle  . Physical activity:    Days per week: Not on file    Minutes per session: Not on file  . Stress: Not on file  Relationships  . Social connections:    Talks on phone: Not on file    Gets together: Not on file    Attends religious service: Not on file    Active member of club or organization: Not on file    Attends meetings of clubs or organizations: Not on file    Relationship status: Not on file  Other Topics Concern  . Not on file  Social History Narrative   Lives with sister, no pets   Occupation: disabled, for bipolar and arthritis   Edu: GED   Activity: take walks   Diet: good water, vegetables daily   Religion: [employer], Dr. Jimmye Norman (ph [phone])   Additional Social History:    Allergies:   Allergies  Allergen Reactions  . Risperidone And Related Other (See Comments)    Reaction:  Altered mental status   . Penicillins Other (See Comments)    Pt states that this med knocks her out.  Has patient had a PCN reaction causing immediate rash, facial/tongue/throat swelling, SOB or lightheadedness with hypotension Unsure  Has patient had a PCN reaction causing severe rash involving mucus membranes or skin necrosis Unsure  Has patient had a PCN reaction that required hospitalization Unsure  Has patient had a PCN reaction occurring within the last 10 years Unsure  If all of the above answers are "NO", then may proceed with Cephalosporin use.  Clementeen Hoof [Iodinated Diagnostic Agents] Rash  . Tetanus Toxoids Rash       . Zetia [Ezetimibe] Rash    Labs:  Results for orders placed or performed during the hospital encounter of 03/11/18 (from the past 48 hour(s))  Comprehensive metabolic panel     Status: Abnormal   Collection Time: 03/11/18  2:39 PM  Result Value Ref Range   Sodium 136 135 - 145 mmol/L   Potassium 5.3 (H) 3.5 - 5.1 mmol/L   Chloride 104 98 - 111 mmol/L   CO2 22 22 - 32 mmol/L   Glucose, Bld 282 (H) 70 - 99 mg/dL   BUN 23 8 - 23 mg/dL   Creatinine, Ser 1.58 (H) 0.44 - 1.00 mg/dL   Calcium 9.7 8.9 - 10.3 mg/dL   Total Protein 7.5 6.5 - 8.1 g/dL   Albumin 4.2 3.5 - 5.0 g/dL   AST 37 15 - 41 U/L   ALT 24 0 - 44 U/L   Alkaline Phosphatase 80 38 - 126 U/L   Total Bilirubin 0.5 0.3 - 1.2 mg/dL   GFR calc non Af Wyvonnia Lora  33 (L) >60 mL/min   GFR calc Af Amer 38 (L) >60 mL/min    Comment: (NOTE) The eGFR has been calculated using the CKD EPI equation. This calculation has not been validated in all clinical situations. eGFR's persistently <60 mL/min signify possible Chronic Kidney Disease.    Anion gap 10 5 - 15    Comment: Performed at [facility], [address] [place], [place] [address]  Ethanol     Status: None   Collection Time: 03/11/18  2:39 PM  Result Value Ref Range   Alcohol, Ethyl (B) <10 <10 mg/dL    Comment: (NOTE) Lowest detectable limit for serum alcohol is 10 mg/dL. For medical purposes only. Performed at [facility], [address] [place], [place] [address]   Urine Drug Screen, Qualitative     Status: Abnormal   Collection Time: 03/11/18  2:39 PM  Result Value Ref Range   Tricyclic, Ur Screen NONE DETECTED NONE DETECTED   Amphetamines, Ur Screen NONE DETECTED NONE DETECTED   MDMA (Ecstasy)Ur Screen NONE DETECTED NONE DETECTED   Cocaine Metabolite,Ur [place] NONE DETECTED NONE DETECTED   Opiate, Ur Screen NONE DETECTED NONE DETECTED   Phencyclidine (PCP) Ur S NONE DETECTED NONE DETECTED   Cannabinoid 50 Ng, Ur [place] NONE DETECTED NONE DETECTED   Barbiturates, Ur Screen NONE DETECTED NONE DETECTED   Benzodiazepine, Ur Scrn TEST NOT PERFORMED, REAGENT NOT AVAILABLE (A) NONE DETECTED   Methadone Scn, Ur NONE DETECTED NONE DETECTED    Comment: (NOTE) Tricyclics + metabolites, urine    Cutoff 1000 ng/mL Amphetamines + metabolites, urine  Cutoff 1000 ng/mL MDMA (Ecstasy), urine              Cutoff 500 ng/mL Cocaine Metabolite, urine          Cutoff 300 ng/mL Opiate + metabolites, urine        Cutoff 300 ng/mL Phencyclidine (PCP), urine         Cutoff 25 ng/mL Cannabinoid, urine                 Cutoff 50 ng/mL Barbiturates + metabolites, urine  Cutoff 200 ng/mL Benzodiazepine, urine              Cutoff 200 ng/mL Methadone, urine                   Cutoff 300 ng/mL The urine drug screen provides only a preliminary, unconfirmed analytical test result and should not be used for non-medical purposes. Clinical consideration and professional judgment should be applied to any positive drug screen result due to possible interfering substances. A more specific alternate chemical method must be used in  order to obtain a confirmed analytical result. Gas chromatography / mass spectrometry (GC/MS) is the preferred confirmat ory method. Performed at [facility], [address] [place], [place] [address]   Valproic acid level     Status: Abnormal   Collection Time: 03/11/18  2:39 PM  Result Value Ref Range   Valproic Acid Lvl 47 (L) 50.0 - 100.0 ug/mL    Comment: Performed at [facility], [address] [place], [place] [address]  CBC     Status: Abnormal   Collection Time: 03/11/18  4:02 PM  Result Value Ref Range   WBC 7.5 3.6 - 11.0 K/uL   RBC 3.90 3.80 - 5.20 MIL/uL   Hemoglobin 13.4 12.0 - 16.0 g/dL   HCT 37.6 35.0 - 47.0 %   MCV 96.4  80.0 - 100.0 fL   MCH 34.4 (H) 26.0 - 34.0 pg   MCHC 35.7 32.0 - 36.0 g/dL   RDW 14.2 11.5 - 14.5 %   Platelets 125 (L) 150 - 440 K/uL    Comment: Performed at [facility], [address] [place], [place] [address]    No current facility-administered medications for this encounter.    Current Outpatient Medications  Medication Sig Dispense Refill  . ACCU-CHEK AVIVA PLUS test strip 1 each by Other route daily. E11.21 100 each 3  . bisacodyl (DULCOLAX) 10 MG suppository Place 1 suppository (10 mg total) rectally daily as needed for moderate constipation. 12 suppository 0  . clotrimazole (LOTRIMIN) 1 % cream Apply 1 application topically every morning. To affected area on toes 30 g 0  . colchicine 0.6 MG tablet Take 0.6 mg by mouth daily. Take 2 tablets on first day, then 1 tablet daily for 10 day as needed for gout    . diclofenac sodium (VOLTAREN) 1 % GEL Apply 4 g topically 4 (four) times daily. To knees 5 Tube 5  . divalproex (DEPAKOTE) 500 MG DR tablet Take 1 tablet (500 mg total) by mouth 2 (two) times daily with a meal. 60 tablet 3  . docusate sodium (COLACE) 100 MG capsule Take 2 capsules (200 mg total) by mouth 2 (two) times daily. 60 capsule 1  . GAVILAX powder TAKE 17 GRAMS BY MOUTH TWICE DAILY AS  NEEDED FOR MODERATE CONSTIPATION. 510 g 1  . HYDROcodone-acetaminophen (NORCO/VICODIN) 5-325 MG tablet Take 1 tablet by mouth 3 (three) times daily as needed for moderate pain. 15 tablet 0  . lamoTRIgine (LAMICTAL) 25 MG tablet Take 2 tablets (50 mg total) by mouth 2 (two) times daily. 120 tablet 1  . levothyroxine (SYNTHROID, LEVOTHROID) 50 MCG tablet Take 1 tablet (50 mcg total) by mouth daily before breakfast. 30 tablet 11  . losartan (COZAAR) 50 MG tablet Take 1 tablet (50 mg total) by mouth daily. 30 tablet 11  . MELATONIN PO Take 1 tablet by mouth at bedtime.    . metoprolol tartrate (LOPRESSOR) 25 MG tablet Take 0.5 tablets (12.5 mg total) by mouth 2 (two) times daily. 60 tablet 1  . paliperidone (INVEGA SUSTENNA) 234 MG/1.5ML SUSP injection Inject 234 mg into the muscle every 30 (thirty) days.    Marland Kitchen QUEtiapine (SEROQUEL) 300 MG tablet Take 1 tablet (300 mg total) by mouth at bedtime.    . senna (SENOKOT) 8.6 MG TABS tablet Take 1 tablet (8.6 mg total) by mouth at bedtime as needed for mild constipation. 120 each 0  . simvastatin (ZOCOR) 20 MG tablet Take 1 tablet (20 mg total) by mouth at bedtime. 30 tablet 11  . sitaGLIPtin (JANUVIA) 25 MG tablet Take 1 tablet (25 mg total) by mouth daily. 30 tablet 3  . triamcinolone cream (KENALOG) 0.1 % Apply 1 application topically at bedtime. Apply to AA. 30 g 0    Musculoskeletal: Strength & Muscle Tone: within normal limits Gait & Station: normal Patient leans: N/A  Psychiatric Specialty Exam: Physical Exam  Nursing note and vitals reviewed. Psychiatric: Her affect is labile. Her speech is rapid and/or pressured. She is hyperactive and actively hallucinating. Thought content is paranoid and delusional. Cognition and memory are impaired. She expresses impulsivity.    Review of Systems  Neurological: Negative.   Psychiatric/Behavioral: Positive for hallucinations. The patient has insomnia.   All other systems reviewed and are negative.    Blood pressure 140/90, pulse  85, temperature 98 F (36.7 C), temperature source Oral, resp. rate 18, height 5' 2"  (1.575 m), weight 92.1 kg, SpO2 100 %.Body mass index is 37.13 kg/m.  General Appearance: Fairly Groomed  Eye Contact:  Good  Speech:  Clear and Coherent and Pressured  Volume:  Increased  Mood:  Euphoric  Affect:  Congruent  Thought Process:  Disorganized and Descriptions of Associations: Tangential  Orientation:  Full (Time, Place, and Person)  Thought Content:  Delusions and Paranoid Ideation  Suicidal Thoughts:  No  Homicidal Thoughts:  No  Memory:  Immediate;   Poor Recent;   Poor Remote;   Poor  Judgement:  Poor  Insight:  Lacking  Psychomotor Activity:  Increased  Concentration:  Concentration: Poor and Attention Span: Poor  Recall:  Poor  Fund of Knowledge:  Fair  Language:  Fair  Akathisia:  No  Handed:  Right  AIMS (if indicated):     Assets:  Communication Skills Desire for Improvement Financial Resources/Insurance Housing Resilience Social Support  ADL's:  Intact  Cognition:  WNL  Sleep:        Treatment Plan Summary: Daily contact with patient to assess and evaluate symptoms and progress in treatment and Medication management  PLAN: Wil admit to psychiatry. Orders entered.  Disposition: Recommend psychiatric Inpatient admission when medically cleared. Supportive therapy provided about ongoing stressors.    Orson Slick, MD 03/11/2018 9:05 PM

## 2018-03-11 NOTE — ED Provider Notes (Signed)
[facility] Emergency Department Provider Note    ____________________________________________   I have reviewed the triage vital signs and the nursing notes.   HISTORY  Chief Complaint Psychiatric Evaluation   History limited by: Not Limited   HPI Gina [name] is a 67 y.o. female who presents to the emergency department today because of concerns for medication side effects.  Patient states that she had her medications changed couple weeks ago.  Since then she feels like she is having a harder time sleeping.  She also does like that is stopped up her bowels.  She states it was changed by Dr. in the oncology clinic.  She has not contacted that doctor about the concern for medication side effects.    Per medical record review patient has a history of ED visits for constipation in the past  Past Medical History:  Diagnosis Date  . Anemia in chronic kidney disease   . Bipolar depression [facility])    sees psychiatrist - psych admission 03/2015  . Bladder cancer ([place]) 1990's  . Blood transfusion without reported diagnosis 2009  . Cataract    left  . CKD (chronic kidney disease) stage 3, GFR 30-59 ml/min (HCC)   . Colon cancer ([place]) 1990's  . DJD (degenerative joint disease), lumbar    chronic lower back pain  . Enterocutaneous fistula 04/07/2012   completed PT 06/2012 ([facility])  . Family history of breast cancer   . Family history of colon cancer   . Family history of stomach cancer   . GERD (gastroesophageal reflux disease)   . History of bladder cancer 1997  . History of colon cancer    s/p surgery  . History of uterine cancer    s/p hysterectomy  . Hyperlipidemia   . Hypertension   . IDA (iron deficiency anemia) 01/2013   thought due to h/o polyps  . Obesity (BMI 30-39.9)   . OSA on CPAP    6cm H2O  . Personal history of colonic adenomas and colon cancer 11/11/2012  . Positive hepatitis C antibody test 09/2014   but negative  confirmatory testing  . RBBB   . Uncontrolled type 2 diabetes mellitus with nephropathy ([place]) 06/07/2014   completed DSME 02/2016    Patient Active Problem List   Diagnosis Date Noted  . Rash of hands 02/18/2018  . Pain of right heel 12/30/2017  . Encounter for chronic pain management 12/18/2017  . Foot pain, bilateral 11/25/2017  . Gallbladder polyp 10/21/2017  . Left shoulder pain 10/21/2017  . Benign neoplasm of ascending colon   . Low back pain radiating to left lower extremity 02/26/2017  . Acquired hypothyroidism 09/10/2016  . Thrombocytopenia ([place]) 06/15/2016  . Medicare annual wellness visit, subsequent 06/04/2016  . Advanced care planning/counseling discussion 06/04/2016  . External hemorrhoid 04/02/2016  . Slow transit constipation 01/04/2016  . Bipolar I disorder, most recent episode (or current) manic ([place]) 12/03/2015  . NAFLD (nonalcoholic fatty liver disease) 03/28/2015  . Gallbladder calculus without cholecystitis 03/28/2015  . Gout 03/27/2015  . HCV antibody positive 03/27/2015  . History of bladder cancer 03/27/2015  . History of bundle branch block 03/27/2015  . Genetic testing 02/28/2015  . Loss of memory 09/04/2014  . Controlled type 2 diabetes mellitus with diabetic nephropathy ([place]) 06/07/2014  . Hyperlipidemia   . Anemia in CKD (chronic kidney disease) 01/26/2013  . Personal history of colonic adenomas and colon cancer 11/11/2012  . Osteoarthritis of knee 10/02/2012  . OSA on CPAP   .  CKD (chronic kidney disease) stage 4, GFR 15-29 ml/min (HCC)   . Severe obesity (BMI 35.0-39.9) with comorbidity ([place])   . HTN (hypertension), benign 04/07/2012  . Hx of colon cancer, stage I 08/05/2011    Past Surgical History:  Procedure Laterality Date  . BREAST BIOPSY Right 01/2014   fibroadenoma  . COLONOSCOPY  07/2009  . COLONOSCOPY  11/2012   2 tubular adenomas, mild diverticulosis, pending genetic testing for Lynch syndrome [name]) rpt 2 yrs  . COLONOSCOPY   02/2015   TA, diverticulosis, rpt 2 yrs [name])  . COLONOSCOPY WITH PROPOFOL N/A 06/09/2017   TAx1, rpt 2 yrs [name], [name], MD)  . DEXA  12/2009   WNL  . DOBUTAMINE STRESS ECHO  12/2009   no evidence of ischemia  . HERNIA REPAIR  02/04/12  . i & d abdominal wound  02/19/12  . LEFT OOPHORECTOMY  2005  . PARTIAL COLECTOMY  about 2008   for colon cancer  . PARTIAL HYSTERECTOMY  1981   uterine cancer, R ovary remains  . Reexploration of abdominal wound and Allograft placemet  02/26/12  . Removal of infected mesh and abdominal wound vac placement  02/24/12  . sleep study  02/2014   OSA - AHI 55, nadir 81% [name])    Prior to Admission medications   Medication Sig Start Date End Date Taking? Authorizing Provider  ACCU-CHEK AVIVA PLUS test strip 1 each by Other route daily. E11.21 09/18/17   Ria Bush, MD  bisacodyl (DULCOLAX) 10 MG suppository Place 1 suppository (10 mg total) rectally daily as needed for moderate constipation. 11/23/17   Pucilowska, Jolanta B, MD  clotrimazole (LOTRIMIN) 1 % cream Apply 1 application topically every morning. To affected area on toes 02/18/18   Ria Bush, MD  colchicine 0.6 MG tablet Take 0.6 mg by mouth daily. Take 2 tablets on first day, then 1 tablet daily for 10 day as needed for gout    [provider]  diclofenac sodium (VOLTAREN) 1 % GEL Apply 4 g topically 4 (four) times daily. To knees 01/06/18   Copland, Frederico Hamman, MD  divalproex (DEPAKOTE) 500 MG DR tablet Take 1 tablet (500 mg total) by mouth 2 (two) times daily with a meal. 01/15/16   Ria Bush, MD  docusate sodium (COLACE) 100 MG capsule Take 2 capsules (200 mg total) by mouth 2 (two) times daily. 11/23/17   Pucilowska, Wardell Honour, MD  GAVILAX powder TAKE 17 GRAMS BY MOUTH TWICE DAILY AS NEEDED FOR MODERATE CONSTIPATION. 02/11/18   Ria Bush, MD  HYDROcodone-acetaminophen (NORCO/VICODIN) 5-325 MG tablet Take 1 tablet by mouth 3 (three) times daily as needed for moderate  pain. 12/18/17   Ria Bush, MD  lamoTRIgine (LAMICTAL) 25 MG tablet Take 2 tablets (50 mg total) by mouth 2 (two) times daily. 11/24/17   Pucilowska, Wardell Honour, MD  levothyroxine (SYNTHROID, LEVOTHROID) 50 MCG tablet Take 1 tablet (50 mcg total) by mouth daily before breakfast. 10/21/17   Ria Bush, MD  losartan (COZAAR) 50 MG tablet Take 1 tablet (50 mg total) by mouth daily. 07/03/17   Ria Bush, MD  MELATONIN PO Take 1 tablet by mouth at bedtime.    [provider]  metoprolol tartrate (LOPRESSOR) 25 MG tablet Take 0.5 tablets (12.5 mg total) by mouth 2 (two) times daily. 11/23/17   Pucilowska, Jolanta B, MD  paliperidone (INVEGA SUSTENNA) 234 MG/1.5ML SUSP injection Inject 234 mg into the muscle every 30 (thirty) days.    [provider]  QUEtiapine (SEROQUEL) 300 MG tablet Take 1 tablet (300 mg total) by mouth at bedtime. 03/01/18   Ria Bush, MD  senna (SENOKOT) 8.6 MG TABS tablet Take 1 tablet (8.6 mg total) by mouth at bedtime as needed for mild constipation. 11/09/17   Pucilowska, Jolanta B, MD  simvastatin (ZOCOR) 20 MG tablet Take 1 tablet (20 mg total) by mouth at bedtime. 07/03/17   Ria Bush, MD  sitaGLIPtin (JANUVIA) 25 MG tablet Take 1 tablet (25 mg total) by mouth daily. 02/18/18   Ria Bush, MD  triamcinolone cream (KENALOG) 0.1 % Apply 1 application topically at bedtime. Apply to [place]. 02/18/18 02/18/19  Ria Bush, MD    Allergies Risperidone and related; Penicillins; Ivp dye [iodinated diagnostic agents]; Tetanus toxoids; and Zetia [ezetimibe]  Family History  Problem Relation Age of Onset  . Colon cancer Mother 70  . Stomach cancer Mother        dx in her 65s?  . CAD Father        MI; deceased 29  . Mental illness Sister        anxiety/depression  . Thyroid disease Sister   . Breast cancer Maternal Grandmother        age at dx unknown  . Diabetes Brother   . Diabetes Brother   . Diabetes Other         aunts and uncles both sides  . Arthritis Other        strong fmhx  . Colon cancer Other 37       maternal half-sister; deceased    Social History Social History   Tobacco Use  . Smoking status: Never Smoker  . Smokeless tobacco: Never Used  Substance Use Topics  . Alcohol use: No    Alcohol/week: 0.0 standard drinks  . Drug use: No    Review of Systems Constitutional: No fever/chills Eyes: No visual changes. ENT: No sore throat. Cardiovascular: Denies chest pain. Respiratory: Denies shortness of breath. Gastrointestinal: No abdominal pain.  Positive for constipation Genitourinary: Negative for dysuria. Musculoskeletal: Negative for back pain. Skin: Negative for rash. Neurological: Negative for headaches, focal weakness or numbness.  ____________________________________________   PHYSICAL EXAM:  VITAL SIGNS: ED Triage Vitals  Enc Vitals Group     BP 03/11/18 1422 (!) 178/89     Pulse Rate 03/11/18 1422 89     Resp 03/11/18 1422 18     Temp 03/11/18 1422 98.2 F (36.8 C)     Temp Source 03/11/18 1422 Oral     SpO2 03/11/18 1422 90 %     Weight 03/11/18 1420 203 lb (92.1 kg)     Height 03/11/18 1420 5\' 2"  (1.575 m)     Head Circumference --      Peak Flow --      Pain Score 03/11/18 1420 10   Constitutional: Alert and oriented.  Eyes: Conjunctivae are normal.  ENT      Head: Normocephalic and atraumatic.      Nose: No congestion/rhinnorhea.      Mouth/Throat: Mucous membranes are moist.      Neck: No stridor. Hematological/Lymphatic/Immunilogical: No cervical lymphadenopathy. Cardiovascular: Normal rate, regular rhythm.  No murmurs, rubs, or gallops.  Respiratory: Normal respiratory effort without tachypnea nor retractions. Breath sounds are clear and equal bilaterally. No wheezes/rales/rhonchi. Gastrointestinal: Soft and non tender. No rebound. No guarding.  Genitourinary: Deferred Musculoskeletal: Normal range of motion in all extremities. No lower  extremity edema. Neurologic:  Normal speech and language. No gross focal  neurologic deficits are appreciated.  Skin:  Skin is warm, dry and intact. No rash noted. Psychiatric: Mood and affect are normal. Speech and behavior are normal. Patient exhibits appropriate insight and judgment.  ____________________________________________    LABS (pertinent positives/negatives)  CMP na 136, k 5.3, glu 282, cr 1.58 Ethanol <10 Valproic acid 47 CBC wbc 7.5, hgb 13.4, plt 125 ____________________________________________   EKG  None  ____________________________________________    RADIOLOGY  None  ____________________________________________   PROCEDURES  Procedures  ____________________________________________   INITIAL IMPRESSION / ASSESSMENT AND PLAN / ED COURSE  Pertinent labs & imaging results that were available during my care of the patient were reviewed by me and considered in my medical decision making (see chart for details).   Patient presented to the emergency department today because of concerns for side effects of medication.  Patient was seen by psychiatry who plan on admission.  ____________________________________________   FINAL CLINICAL IMPRESSION(S) / ED DIAGNOSES  Final diagnoses:  Adverse effect of drug, initial encounter     Note: This dictation was prepared with Dragon dictation. Any transcriptional errors that result from this process are unintentional     Nance Pear, MD 03/11/18 [phone]

## 2018-03-11 NOTE — ED Notes (Signed)
Per ACT team member, pt has been more manic lately and has not slept in several days.  She also states when patient is like this she does not allow ACT team to intervene and wants to come [facility] to [facility]

## 2018-03-11 NOTE — ED Notes (Signed)
Pt belongings: slides, shorts, t-shirt, glasses, sweatshirt, black bag with clothing.  Pt denies cell phone or cash. Pt does not have wallet.

## 2018-03-11 NOTE — BH Assessment (Addendum)
Patient is to be admitted to [facility] by Dr. Bary Leriche.  Attending Physician will be Dr. Bary Leriche.   Patient has been assigned to room 302, by [place].    ER staff is aware of the admission:  [facility] ER Dian Situ   Dr. Archie Balboa, ER MD   Irine Seal Patient's Nurse   Otilio [facility] Patient Access.

## 2018-03-11 NOTE — ED Notes (Signed)
Pt ACT Team Numbers: [phone] and crisis line [phone]

## 2018-03-12 ENCOUNTER — Encounter: Payer: Self-pay | Admitting: Psychiatry

## 2018-03-12 DIAGNOSIS — F312 Bipolar disorder, current episode manic severe with psychotic features: Principal | ICD-10-CM

## 2018-03-12 MED ORDER — PALIPERIDONE ER 3 MG PO TB24
6.0000 mg | ORAL_TABLET | Freq: Every day | ORAL | Status: DC
  Administered 2018-03-12 – 2018-03-15 (×4): 6 mg via ORAL
  Filled 2018-03-12 (×4): qty 2

## 2018-03-12 MED ORDER — DIVALPROEX SODIUM 250 MG PO DR TAB
250.0000 mg | DELAYED_RELEASE_TABLET | Freq: Two times a day (BID) | ORAL | Status: DC
  Administered 2018-03-12 – 2018-03-15 (×7): 250 mg via ORAL
  Filled 2018-03-12 (×7): qty 1

## 2018-03-12 MED ORDER — PALIPERIDONE PALMITATE ER 234 MG/1.5ML IM SUSY: 234.0000 mg | PREFILLED_SYRINGE | INTRAMUSCULAR | Status: DC

## 2018-03-12 NOTE — Plan of Care (Signed)
  Problem: [facility] Concurrent Medical Problem Goal: LTG-Pt will be physically stable and he/significant other Description (Patient will be physically stable and he/significant other will be able to verbalize understanding of follow-up care and symptoms that would warrant further treatment) Outcome: Progressing

## 2018-03-12 NOTE — Tx Team (Signed)
Interdisciplinary Treatment and Diagnostic Plan Update  03/12/2018 Time of Session: 10:50 AM Devaney Segers MRN: [id number]  Principal Diagnosis: Bipolar I disorder, current or most recent episode manic, with psychotic features ([place])  Secondary Diagnoses: Principal Problem:   Bipolar I disorder, current or most recent episode manic, with psychotic features ([place]) Active Problems:   HTN (hypertension), benign   Osteoarthritis of knee   Controlled type 2 diabetes mellitus with diabetic nephropathy (HCC)   Slow transit constipation   Acquired hypothyroidism   Current Medications:  Current Facility-Administered Medications  Medication Dose Route Frequency Provider Last Rate Last Dose  . acetaminophen (TYLENOL) tablet 650 mg  650 mg Oral Q6H PRN Pucilowska, Jolanta B, MD   650 mg at 03/12/18 1305  . alum & mag hydroxide-simeth (MAALOX/MYLANTA) 200-200-20 MG/5ML suspension 30 mL  30 mL Oral Q4H PRN Pucilowska, Jolanta B, MD      . bisacodyl (DULCOLAX) suppository 10 mg  10 mg Rectal Daily PRN Pucilowska, Jolanta B, MD      . colchicine tablet 0.6 mg  0.6 mg Oral Daily Pucilowska, Jolanta B, MD   0.6 mg at 03/12/18 0813  . diclofenac sodium (VOLTAREN) 1 % transdermal gel 4 g  4 g Topical QID Pucilowska, Jolanta B, MD   4 g at 03/12/18 0815  . divalproex (DEPAKOTE) DR tablet 250 mg  250 mg Oral BID WC Pucilowska, Jolanta B, MD      . docusate sodium (COLACE) capsule 200 mg  200 mg Oral BID Pucilowska, Jolanta B, MD   200 mg at 03/12/18 0813  . levothyroxine (SYNTHROID, LEVOTHROID) tablet 50 mcg  50 mcg Oral QAC breakfast Pucilowska, Jolanta B, MD   50 mcg at 03/12/18 0812  . linagliptin (TRADJENTA) tablet 5 mg  5 mg Oral Daily Pucilowska, Jolanta B, MD   5 mg at 03/12/18 0813  . losartan (COZAAR) tablet 50 mg  50 mg Oral Daily Pucilowska, Jolanta B, MD   50 mg at 03/12/18 0813  . magnesium hydroxide (MILK OF MAGNESIA) suspension 30 mL  30 mL Oral Daily PRN Pucilowska, Jolanta B, MD       . Melatonin TABS 10 mg  10 mg Oral QHS Pucilowska, Jolanta B, MD   10 mg at 03/12/18 0111  . metoprolol tartrate (LOPRESSOR) tablet 12.5 mg  12.5 mg Oral BID Pucilowska, Jolanta B, MD   12.5 mg at 03/12/18 0812  . [START ON 04/06/2018] paliperidone (INVEGA SUSTENNA) injection 234 mg  234 mg Intramuscular Q28 days Pucilowska, Jolanta B, MD      . paliperidone (INVEGA) 24 hr tablet 6 mg  6 mg Oral Daily Pucilowska, Jolanta B, MD      . QUEtiapine (SEROQUEL) tablet 300 mg  300 mg Oral QHS Pucilowska, Jolanta B, MD   300 mg at 03/12/18 0020  . senna (SENOKOT) tablet 8.6 mg  1 tablet Oral QHS PRN Pucilowska, Jolanta B, MD   8.6 mg at 03/12/18 0020  . simvastatin (ZOCOR) tablet 20 mg  20 mg Oral QHS Pucilowska, Jolanta B, MD       PTA Medications: Medications Prior to Admission  Medication Sig Dispense Refill Last Dose  . ACCU-CHEK AVIVA PLUS test strip 1 each by Other route daily. E11.21 100 each 3 Taking  . bisacodyl (DULCOLAX) 10 MG suppository Place 1 suppository (10 mg total) rectally daily as needed for moderate constipation. 12 suppository 0 Taking  . clotrimazole (LOTRIMIN) 1 % cream Apply 1 application topically every morning. To affected area on  toes 30 g 0 Taking  . colchicine 0.6 MG tablet Take 0.6 mg by mouth daily. Take 2 tablets on first day, then 1 tablet daily for 10 day as needed for gout   Taking  . diclofenac sodium (VOLTAREN) 1 % GEL Apply 4 g topically 4 (four) times daily. To knees 5 Tube 5 Taking  . divalproex (DEPAKOTE) 500 MG DR tablet Take 1 tablet (500 mg total) by mouth 2 (two) times daily with a meal. 60 tablet 3 Taking  . docusate sodium (COLACE) 100 MG capsule Take 2 capsules (200 mg total) by mouth 2 (two) times daily. 60 capsule 1 Taking  . GAVILAX powder TAKE 17 GRAMS BY MOUTH TWICE DAILY AS NEEDED FOR MODERATE CONSTIPATION. 510 g 1 Taking  . HYDROcodone-acetaminophen (NORCO/VICODIN) 5-325 MG tablet Take 1 tablet by mouth 3 (three) times daily as needed for moderate  pain. 15 tablet 0 Taking  . lamoTRIgine (LAMICTAL) 25 MG tablet Take 2 tablets (50 mg total) by mouth 2 (two) times daily. 120 tablet 1 Taking  . levothyroxine (SYNTHROID, LEVOTHROID) 50 MCG tablet Take 1 tablet (50 mcg total) by mouth daily before breakfast. 30 tablet 11 Taking  . losartan (COZAAR) 50 MG tablet Take 1 tablet (50 mg total) by mouth daily. 30 tablet 11 Taking  . MELATONIN PO Take 1 tablet by mouth at bedtime.   Taking  . metoprolol tartrate (LOPRESSOR) 25 MG tablet Take 0.5 tablets (12.5 mg total) by mouth 2 (two) times daily. 60 tablet 1 Taking  . paliperidone (INVEGA SUSTENNA) 234 MG/1.5ML SUSP injection Inject 234 mg into the muscle every 30 (thirty) days.   Taking  . QUEtiapine (SEROQUEL) 300 MG tablet Take 1 tablet (300 mg total) by mouth at bedtime.     . senna (SENOKOT) 8.6 MG TABS tablet Take 1 tablet (8.6 mg total) by mouth at bedtime as needed for mild constipation. 120 each 0 Taking  . simvastatin (ZOCOR) 20 MG tablet Take 1 tablet (20 mg total) by mouth at bedtime. 30 tablet 11 Taking  . sitaGLIPtin (JANUVIA) 25 MG tablet Take 1 tablet (25 mg total) by mouth daily. 30 tablet 3 Taking  . triamcinolone cream (KENALOG) 0.1 % Apply 1 application topically at bedtime. Apply to AA. 30 g 0 Taking    Patient Stressors: Health problems Medication change or noncompliance  Patient Strengths: Ability for insight General fund of knowledge Motivation for treatment/growth  Treatment Modalities: Medication Management, Group therapy, Case management,  1 to 1 session with clinician, Psychoeducation, Recreational therapy.   Physician Treatment Plan for Primary Diagnosis: Bipolar I disorder, current or most recent episode manic, with psychotic features ([place]) Long Term Goal(s): Improvement in symptoms so as ready for discharge NA   Short Term Goals: Ability to identify changes in lifestyle to reduce recurrence of condition will improve Ability to verbalize feelings will  improve Ability to disclose and discuss suicidal ideas Ability to demonstrate self-control will improve Ability to identify and develop effective coping behaviors will improve Ability to maintain clinical measurements within normal limits will improve Ability to identify triggers associated with substance abuse/mental health issues will improve NA  Medication Management: Evaluate patient's response, side effects, and tolerance of medication regimen.  Therapeutic Interventions: 1 to 1 sessions, Unit Group sessions and Medication administration.  Evaluation of Outcomes: Progressing  Physician Treatment Plan for Secondary Diagnosis: Principal Problem:   Bipolar I disorder, current or most recent episode manic, with psychotic features ([place]) Active Problems:   HTN (hypertension), benign  Osteoarthritis of knee   Controlled type 2 diabetes mellitus with diabetic nephropathy (HCC)   Slow transit constipation   Acquired hypothyroidism  Long Term Goal(s): Improvement in symptoms so as ready for discharge NA   Short Term Goals: Ability to identify changes in lifestyle to reduce recurrence of condition will improve Ability to verbalize feelings will improve Ability to disclose and discuss suicidal ideas Ability to demonstrate self-control will improve Ability to identify and develop effective coping behaviors will improve Ability to maintain clinical measurements within normal limits will improve Ability to identify triggers associated with substance abuse/mental health issues will improve NA     Medication Management: Evaluate patient's response, side effects, and tolerance of medication regimen.  Therapeutic Interventions: 1 to 1 sessions, Unit Group sessions and Medication administration.  Evaluation of Outcomes: Progressing   RN Treatment Plan for Primary Diagnosis: Bipolar I disorder, current or most recent episode manic, with psychotic features ([place]) Long Term Goal(s):  Knowledge of disease and therapeutic regimen to maintain health will improve  Short Term Goals: Ability to remain free from injury will improve, Ability to demonstrate self-control and Compliance with prescribed medications will improve  Medication Management: RN will administer medications as ordered by provider, will assess and evaluate patient's response and provide education to patient for prescribed medication. RN will report any adverse and/or side effects to prescribing provider.  Therapeutic Interventions: 1 on 1 counseling sessions, Psychoeducation, Medication administration, Evaluate responses to treatment, Monitor vital signs and CBGs as ordered, Perform/monitor CIWA, COWS, AIMS and Fall Risk screenings as ordered, Perform wound care treatments as ordered.  Evaluation of Outcomes: Progressing   LCSW Treatment Plan for Primary Diagnosis: Bipolar I disorder, current or most recent episode manic, with psychotic features ([place]) Long Term Goal(s): Safe transition to appropriate next level of care at discharge, Engage patient in therapeutic group addressing interpersonal concerns.  Short Term Goals: Engage patient in aftercare planning with referrals and resources and Increase skills for wellness and recovery  Therapeutic Interventions: Assess for all discharge needs, 1 to 1 time with Social worker, Explore available resources and support systems, Assess for adequacy in community support network, Educate family and significant other(s) on suicide prevention, Complete Psychosocial Assessment, Interpersonal group therapy.  Evaluation of Outcomes: Progressing   Progress in Treatment: Attending groups: No. Participating in groups: No. Taking medication as prescribed: Yes. Toleration medication: Yes. Family/Significant other contact made: No, will contact:  CSW given consent to contact pt's sister Devoria Glassing Patient understands diagnosis: Yes. Discussing patient identified problems/goals  with staff: Yes. Medical problems stabilized or resolved: Yes. Denies suicidal/homicidal ideation: Yes. Issues/concerns per patient self-inventory: No. Other: n/a  New problem(s) identified: No, Describe:  No new problems identified  New Short Term/Long Term Goal(s):  Patient Goals:  "get my medication right"  Discharge Plan or Barriers:   Reason for Continuation of Hospitalization: Mania Medication stabilization  Estimated Length of Stay: 5-7 days  Attendees: Patient: Tabbetha Kutscher 03/12/2018 5:06 PM  Physician: Orson Slick, MD 03/12/2018 5:06 PM  Nursing:  03/12/2018 5:06 PM  RN Care Manager: 03/12/2018 5:06 PM  Social Worker: Derrek Gu, LCSW 03/12/2018 5:06 PM  Recreational Therapist: Roanna Epley, LRT 03/12/2018 5:06 PM  Other: Darin Engels, [place] 03/12/2018 5:06 PM  Other: Alden Hipp, LCSW 03/12/2018 5:06 PM  Other: Marney Doctor, Chaplain 03/12/2018 5:06 PM    Scribe for Treatment Team: Devona Konig, LCSW 03/12/2018 5:06 PM

## 2018-03-12 NOTE — H&P (Signed)
Psychiatric Admission Assessment Adult  Patient Identification: Gina [name] MRN:  [id number] Date of Evaluation:  03/12/2018 Chief Complaint:  Bipolar Principal Diagnosis: Bipolar I disorder, current or most recent episode manic, with psychotic features [facility]) Diagnosis:   Patient Active Problem List   Diagnosis Date Noted  . Bipolar I disorder, current or most recent episode manic, with psychotic features ([place]) [F31.2] 11/04/2017    Priority: High  . Rash of hands [R21] 02/18/2018  . Pain of right heel [M79.671] 12/30/2017  . Encounter for chronic pain management [G89.29] 12/18/2017  . Foot pain, bilateral [id number], M79.672] 11/25/2017  . Gallbladder polyp [K82.4] 10/21/2017  . Left shoulder pain [M25.512] 10/21/2017  . Benign neoplasm of ascending colon [D12.2]   . Low back pain radiating to left lower extremity [M54.5, M79.605] 02/26/2017  . Acquired hypothyroidism [E03.9] 09/10/2016  . Thrombocytopenia ([place]) [D69.6] 06/15/2016  . Medicare annual wellness visit, subsequent [Z00.00] 06/04/2016  . Advanced care planning/counseling discussion [Z71.89] 06/04/2016  . External hemorrhoid [K64.4] 04/02/2016  . Slow transit constipation [K59.01] 01/04/2016  . Bipolar I disorder, most recent episode (or current) manic ([place]) [F31.10] 12/03/2015  . NAFLD (nonalcoholic fatty liver disease) [K76.0] 03/28/2015  . Gallbladder calculus without cholecystitis [K80.20] 03/28/2015  . Gout [M10.9] 03/27/2015  . HCV antibody positive [R76.8] 03/27/2015  . History of bladder cancer [Z85.51] 03/27/2015  . History of bundle branch block [Z86.79] 03/27/2015  . Genetic testing [Z13.79] 02/28/2015  . Loss of memory [R41.3] 09/04/2014  . Controlled type 2 diabetes mellitus with diabetic nephropathy ([place]) [E11.21] 06/07/2014  . Hyperlipidemia [E78.5]   . Anemia in CKD (chronic kidney disease) [N18.9, D63.1] 01/26/2013  . Personal history of colonic adenomas and colon cancer [Z86.010] 11/11/2012   . Osteoarthritis of knee [M17.10] 10/02/2012  . OSA on CPAP [G47.33, Z99.89]   . CKD (chronic kidney disease) stage 4, GFR 15-29 ml/min (HCC) [N18.4]   . Severe obesity (BMI 35.0-39.9) with comorbidity ([place]) [E66.01]   . HTN (hypertension), benign [I10] 04/07/2012  . Hx of colon cancer, stage I [Z85.038] 08/05/2011   History of Present Illness:   Identifying data. Gina [name] is a 67 year old female with a history of treatment resistant bipolar disorder.  Chief complaint. "Hi doc."  History of present illness. Information was obtained from the patient and the chart. The patient came to the ER complaining of :mania": She has not slept in several days, prays all the time, talks to much and "can't think". She explains clumsily, that her oncologist suggested tapering off depakote for low platelets. It seems that her Depakote was discontinued abruptly at the beginning.   Past psychiatric history. Long history of bipolar illness with multiple hospitalization for mostly manic episodes. Difficult to treat, Her primary psychiatrist, Dr. Zollie Scale of [facility] added oral Invega to her injections, and has been cross tapering Depakote for Lamictal. There were no suicide attempts.  Family psychiatric history. Sister with intellectual disability.  Social history. Disabled from mental illness. Lives with her sister.   Total Time spent with patient: 45 minutes  Is the patient at risk to self? No.  Has the patient been a risk to self in the past 6 months? No.  Has the patient been a risk to self within the distant past? No.  Is the patient a risk to others? No.  Has the patient been a risk to others in the past 6 months? No.  Has the patient been a risk to others within the distant past? No.   Prior  Inpatient Therapy:   Prior Outpatient Therapy:    Alcohol Screening: 1. How often do you have a drink containing alcohol?: Never 2. How many drinks containing alcohol do you have on a typical day when  you are drinking?: 1 or 2 3. How often do you have six or more drinks on one occasion?: Never AUDIT-C Score: 0 4. How often during the last year have you found that you were not able to stop drinking once you had started?: Never 5. How often during the last year have you failed to do what was normally expected from you becasue of drinking?: Never 6. How often during the last year have you needed a first drink in the morning to get yourself going after a heavy drinking session?: Never 7. How often during the last year have you had a feeling of guilt of remorse after drinking?: Never 8. How often during the last year have you been unable to remember what happened the night before because you had been drinking?: Never 9. Have you or someone else been injured as a result of your drinking?: No 10. Has a relative or friend or a doctor or another health worker been concerned about your drinking or suggested you cut down?: No Alcohol Use Disorder Identification Test Final Score (AUDIT): 0 Intervention/Follow-up: AUDIT Score <7 follow-up not indicated Substance Abuse History in the last 12 months:  No. Consequences of Substance Abuse: NA Previous Psychotropic Medications: Yes  Psychological Evaluations: No  Past Medical History:  Past Medical History:  Diagnosis Date  . Anemia in chronic kidney disease   . Bipolar depression [facility])    sees psychiatrist - psych admission 03/2015  . Bladder cancer ([place]) 1990's  . Blood transfusion without reported diagnosis 2009  . Cataract    left  . CKD (chronic kidney disease) stage 3, GFR 30-59 ml/min (HCC)   . Colon cancer ([place]) 1990's  . DJD (degenerative joint disease), lumbar    chronic lower back pain  . Enterocutaneous fistula 04/07/2012   completed PT 06/2012 ([facility])  . Family history of breast cancer   . Family history of colon cancer   . Family history of stomach cancer   . GERD (gastroesophageal reflux disease)   . History of bladder cancer  1997  . History of colon cancer    s/p surgery  . History of uterine cancer    s/p hysterectomy  . Hyperlipidemia   . Hypertension   . IDA (iron deficiency anemia) 01/2013   thought due to h/o polyps  . Obesity (BMI 30-39.9)   . OSA on CPAP    6cm H2O  . Personal history of colonic adenomas and colon cancer 11/11/2012  . Positive hepatitis C antibody test 09/2014   but negative confirmatory testing  . RBBB   . Uncontrolled type 2 diabetes mellitus with nephropathy ([place]) 06/07/2014   completed DSME 02/2016    Past Surgical History:  Procedure Laterality Date  . BREAST BIOPSY Right 01/2014   fibroadenoma  . COLONOSCOPY  07/2009  . COLONOSCOPY  11/2012   2 tubular adenomas, mild diverticulosis, pending genetic testing for Lynch syndrome [name]) rpt 2 yrs  . COLONOSCOPY  02/2015   TA, diverticulosis, rpt 2 yrs [name])  . COLONOSCOPY WITH PROPOFOL N/A 06/09/2017   TAx1, rpt 2 yrs [name], [name], MD)  . DEXA  12/2009   WNL  . DOBUTAMINE STRESS ECHO  12/2009   no evidence of ischemia  . HERNIA REPAIR  02/04/12  . i &  d abdominal wound  02/19/12  . LEFT OOPHORECTOMY  2005  . PARTIAL COLECTOMY  about 2008   for colon cancer  . PARTIAL HYSTERECTOMY  1981   uterine cancer, R ovary remains  . Reexploration of abdominal wound and Allograft placemet  02/26/12  . Removal of infected mesh and abdominal wound vac placement  02/24/12  . sleep study  02/2014   OSA - AHI 55, nadir 81% [name])   Family History:  Family History  Problem Relation Age of Onset  . Colon cancer Mother 23  . Stomach cancer Mother        dx in her 68s?  . CAD Father        MI; deceased 65  . Mental illness Sister        anxiety/depression  . Thyroid disease Sister   . Breast cancer Maternal Grandmother        age at dx unknown  . Diabetes Brother   . Diabetes Brother   . Diabetes Other        aunts and uncles both sides  . Arthritis Other        strong fmhx  . Colon cancer Other 7       maternal  half-sister; deceased   Tobacco Screening: Have you used any form of tobacco in the last 30 days? (Cigarettes, Smokeless Tobacco, Cigars, and/or Pipes): No Social History:  Social History   Substance and Sexual Activity  Alcohol Use No  . Alcohol/week: 0.0 standard drinks     Social History   Substance and Sexual Activity  Drug Use No    Additional Social History:                           Allergies:   Allergies  Allergen Reactions  . Risperidone And Related Other (See Comments)    Reaction:  Altered mental status   . Penicillins Other (See Comments)    Pt states that this med knocks her out.  Has patient had a PCN reaction causing immediate rash, facial/tongue/throat swelling, SOB or lightheadedness with hypotension Unsure  Has patient had a PCN reaction causing severe rash involving mucus membranes or skin necrosis Unsure  Has patient had a PCN reaction that required hospitalization Unsure  Has patient had a PCN reaction occurring within the last 10 years Unsure  If all of the above answers are "NO", then may proceed with Cephalosporin use.  Clementeen Hoof [Iodinated Diagnostic Agents] Rash  . Tetanus Toxoids Rash       . Zetia [Ezetimibe] Rash   Lab Results:  Results for orders placed or performed during the hospital encounter of 03/11/18 (from the past 48 hour(s))  Comprehensive metabolic panel     Status: Abnormal   Collection Time: 03/11/18  2:39 PM  Result Value Ref Range   Sodium 136 135 - 145 mmol/L   Potassium 5.3 (H) 3.5 - 5.1 mmol/L   Chloride 104 98 - 111 mmol/L   CO2 22 22 - 32 mmol/L   Glucose, Bld 282 (H) 70 - 99 mg/dL   BUN 23 8 - 23 mg/dL   Creatinine, Ser 1.58 (H) 0.44 - 1.00 mg/dL   Calcium 9.7 8.9 - 10.3 mg/dL   Total Protein 7.5 6.5 - 8.1 g/dL   Albumin 4.2 3.5 - 5.0 g/dL   AST 37 15 - 41 U/L   ALT 24 0 - 44 U/L   Alkaline Phosphatase 80 38 -  126 U/L   Total Bilirubin 0.5 0.3 - 1.2 mg/dL   GFR calc non Af Amer 33 (L) >60 mL/min   GFR  calc Af Amer 38 (L) >60 mL/min    Comment: (NOTE) The eGFR has been calculated using the CKD EPI equation. This calculation has not been validated in all clinical situations. eGFR's persistently <60 mL/min signify possible Chronic Kidney Disease.    Anion gap 10 5 - 15    Comment: Performed at [facility], [address] [place], [place] [address]  Ethanol     Status: None   Collection Time: 03/11/18  2:39 PM  Result Value Ref Range   Alcohol, Ethyl (B) <10 <10 mg/dL    Comment: (NOTE) Lowest detectable limit for serum alcohol is 10 mg/dL. For medical purposes only. Performed at [facility], [address] [place], [place] [address]   Urine Drug Screen, Qualitative     Status: Abnormal   Collection Time: 03/11/18  2:39 PM  Result Value Ref Range   Tricyclic, Ur Screen NONE DETECTED NONE DETECTED   Amphetamines, Ur Screen NONE DETECTED NONE DETECTED   MDMA (Ecstasy)Ur Screen NONE DETECTED NONE DETECTED   Cocaine Metabolite,Ur [place] NONE DETECTED NONE DETECTED   Opiate, Ur Screen NONE DETECTED NONE DETECTED   Phencyclidine (PCP) Ur S NONE DETECTED NONE DETECTED   Cannabinoid 50 Ng, Ur [place] NONE DETECTED NONE DETECTED   Barbiturates, Ur Screen NONE DETECTED NONE DETECTED   Benzodiazepine, Ur Scrn TEST NOT PERFORMED, REAGENT NOT AVAILABLE (A) NONE DETECTED   Methadone Scn, Ur NONE DETECTED NONE DETECTED    Comment: (NOTE) Tricyclics + metabolites, urine    Cutoff 1000 ng/mL Amphetamines + metabolites, urine  Cutoff 1000 ng/mL MDMA (Ecstasy), urine              Cutoff 500 ng/mL Cocaine Metabolite, urine          Cutoff 300 ng/mL Opiate + metabolites, urine        Cutoff 300 ng/mL Phencyclidine (PCP), urine         Cutoff 25 ng/mL Cannabinoid, urine                 Cutoff 50 ng/mL Barbiturates + metabolites, urine  Cutoff 200 ng/mL Benzodiazepine, urine              Cutoff 200 ng/mL Methadone, urine                   Cutoff 300 ng/mL The urine drug screen  provides only a preliminary, unconfirmed analytical test result and should not be used for non-medical purposes. Clinical consideration and professional judgment should be applied to any positive drug screen result due to possible interfering substances. A more specific alternate chemical method must be used in order to obtain a confirmed analytical result. Gas chromatography / mass spectrometry (GC/MS) is the preferred confirmat ory method. Performed at [facility], [address] [place], [place] [address]   Valproic acid level     Status: Abnormal   Collection Time: 03/11/18  2:39 PM  Result Value Ref Range   Valproic Acid Lvl 47 (L) 50.0 - 100.0 ug/mL    Comment: Performed at [facility], [address] [place], [place] [address]  CBC     Status: Abnormal   Collection Time: 03/11/18  4:02 PM  Result Value Ref Range   WBC 7.5 3.6 - 11.0 K/uL   RBC 3.90 3.80 - 5.20 MIL/uL  Hemoglobin 13.4 12.0 - 16.0 g/dL   HCT 37.6 35.0 - 47.0 %   MCV 96.4 80.0 - 100.0 fL   MCH 34.4 (H) 26.0 - 34.0 pg   MCHC 35.7 32.0 - 36.0 g/dL   RDW 14.2 11.5 - 14.5 %   Platelets 125 (L) 150 - 440 K/uL    Comment: Performed at [facility], [address] [place], [place] [address]    Blood Alcohol level:  Lab Results  Component Value Date   [facility] <10 03/11/2018   ETH <10 70/08/7492    Metabolic Disorder Labs:  Lab Results  Component Value Date   HGBA1C 6.6 (H) 02/18/2018   MPG 122.63 11/14/2017   MPG 114 04/14/2012   Lab Results  Component Value Date   PROLACTIN 33.6 (H) 12/04/2015   Lab Results  Component Value Date   CHOL 140 11/14/2017   TRIG 152 (H) 11/14/2017   HDL 50 11/14/2017   CHOLHDL 2.8 11/14/2017   VLDL 30 11/14/2017   LDLCALC 60 11/14/2017   LDLCALC 81 09/30/2017    Current Medications: Current Facility-Administered Medications  Medication Dose Route Frequency Provider Last Rate Last Dose  . acetaminophen (TYLENOL) tablet 650 mg  650  mg Oral Q6H PRN Login Muckleroy B, MD   650 mg at 03/12/18 1305  . alum & mag hydroxide-simeth (MAALOX/MYLANTA) 200-200-20 MG/5ML suspension 30 mL  30 mL Oral Q4H PRN Geovanie Winnett B, MD      . bisacodyl (DULCOLAX) suppository 10 mg  10 mg Rectal Daily PRN Berthel Bagnall B, MD      . colchicine tablet 0.6 mg  0.6 mg Oral Daily Kalon Erhardt B, MD   0.6 mg at 03/12/18 0813  . diclofenac sodium (VOLTAREN) 1 % transdermal gel 4 g  4 g Topical QID Shyah Cadmus B, MD   4 g at 03/12/18 0815  . divalproex (DEPAKOTE) DR tablet 250 mg  250 mg Oral BID WC Armanda Forand B, MD      . docusate sodium (COLACE) capsule 200 mg  200 mg Oral BID Kaydan Wilhoite B, MD   200 mg at 03/12/18 0813  . levothyroxine (SYNTHROID, LEVOTHROID) tablet 50 mcg  50 mcg Oral QAC breakfast Terril Amaro B, MD   50 mcg at 03/12/18 0812  . linagliptin (TRADJENTA) tablet 5 mg  5 mg Oral Daily Abhijot Straughter B, MD   5 mg at 03/12/18 0813  . losartan (COZAAR) tablet 50 mg  50 mg Oral Daily Cuahutemoc Attar B, MD   50 mg at 03/12/18 0813  . magnesium hydroxide (MILK OF MAGNESIA) suspension 30 mL  30 mL Oral Daily PRN Audrina Marten B, MD      . Melatonin TABS 10 mg  10 mg Oral QHS Wilber Fini B, MD   10 mg at 03/12/18 0111  . metoprolol tartrate (LOPRESSOR) tablet 12.5 mg  12.5 mg Oral BID Niyati Heinke B, MD   12.5 mg at 03/12/18 0812  . [START ON 04/06/2018] paliperidone (INVEGA SUSTENNA) injection 234 mg  234 mg Intramuscular Q28 days Ravindra Baranek B, MD      . paliperidone (INVEGA) 24 hr tablet 6 mg  6 mg Oral Daily Takoya Jonas B, MD      . QUEtiapine (SEROQUEL) tablet 300 mg  300 mg Oral QHS Carlie Corpus B, MD   300 mg at 03/12/18 0020  . senna (SENOKOT) tablet 8.6 mg  1 tablet Oral QHS PRN Kermit Arnette B, MD   8.6 mg at  03/12/18 0020  . simvastatin (ZOCOR) tablet 20 mg  20 mg Oral QHS Joah Patlan B, MD       PTA  Medications: Medications Prior to Admission  Medication Sig Dispense Refill Last Dose  . ACCU-CHEK AVIVA PLUS test strip 1 each by Other route daily. E11.21 100 each 3 Taking  . bisacodyl (DULCOLAX) 10 MG suppository Place 1 suppository (10 mg total) rectally daily as needed for moderate constipation. 12 suppository 0 Taking  . clotrimazole (LOTRIMIN) 1 % cream Apply 1 application topically every morning. To affected area on toes 30 g 0 Taking  . colchicine 0.6 MG tablet Take 0.6 mg by mouth daily. Take 2 tablets on first day, then 1 tablet daily for 10 day as needed for gout   Taking  . diclofenac sodium (VOLTAREN) 1 % GEL Apply 4 g topically 4 (four) times daily. To knees 5 Tube 5 Taking  . divalproex (DEPAKOTE) 500 MG DR tablet Take 1 tablet (500 mg total) by mouth 2 (two) times daily with a meal. 60 tablet 3 Taking  . docusate sodium (COLACE) 100 MG capsule Take 2 capsules (200 mg total) by mouth 2 (two) times daily. 60 capsule 1 Taking  . GAVILAX powder TAKE 17 GRAMS BY MOUTH TWICE DAILY AS NEEDED FOR MODERATE CONSTIPATION. 510 g 1 Taking  . HYDROcodone-acetaminophen (NORCO/VICODIN) 5-325 MG tablet Take 1 tablet by mouth 3 (three) times daily as needed for moderate pain. 15 tablet 0 Taking  . lamoTRIgine (LAMICTAL) 25 MG tablet Take 2 tablets (50 mg total) by mouth 2 (two) times daily. 120 tablet 1 Taking  . levothyroxine (SYNTHROID, LEVOTHROID) 50 MCG tablet Take 1 tablet (50 mcg total) by mouth daily before breakfast. 30 tablet 11 Taking  . losartan (COZAAR) 50 MG tablet Take 1 tablet (50 mg total) by mouth daily. 30 tablet 11 Taking  . MELATONIN PO Take 1 tablet by mouth at bedtime.   Taking  . metoprolol tartrate (LOPRESSOR) 25 MG tablet Take 0.5 tablets (12.5 mg total) by mouth 2 (two) times daily. 60 tablet 1 Taking  . paliperidone (INVEGA SUSTENNA) 234 MG/1.5ML SUSP injection Inject 234 mg into the muscle every 30 (thirty) days.   Taking  . QUEtiapine (SEROQUEL) 300 MG tablet Take 1  tablet (300 mg total) by mouth at bedtime.     . senna (SENOKOT) 8.6 MG TABS tablet Take 1 tablet (8.6 mg total) by mouth at bedtime as needed for mild constipation. 120 each 0 Taking  . simvastatin (ZOCOR) 20 MG tablet Take 1 tablet (20 mg total) by mouth at bedtime. 30 tablet 11 Taking  . sitaGLIPtin (JANUVIA) 25 MG tablet Take 1 tablet (25 mg total) by mouth daily. 30 tablet 3 Taking  . triamcinolone cream (KENALOG) 0.1 % Apply 1 application topically at bedtime. Apply to AA. 30 g 0 Taking    Musculoskeletal: Strength & Muscle Tone: within normal limits Gait & Station: normal Patient leans: N/A  Psychiatric Specialty Exam: I reviewed physical examination performed in the ER and agree with the findings. Physical Exam  Nursing note and vitals reviewed. Psychiatric: Her affect is labile and inappropriate. Her speech is rapid and/or pressured. She is hyperactive. Thought content is paranoid and delusional. Cognition and memory are impaired. She expresses impulsivity.    Review of Systems  Musculoskeletal: Positive for joint pain.  Neurological: Negative.   Psychiatric/Behavioral: The patient has insomnia.   All other systems reviewed and are negative.   Blood pressure (!) 156/85, pulse (!) 108,  temperature 97.6 F (36.4 C), temperature source Oral, resp. rate 19, height _0  (1.6 m), weight 89.8 kg, SpO2 97 %.Body mass index is 35.07 kg/m.  See SRA                                                  Sleep:  Number of Hours: 4.5    Treatment Plan Summary: Daily contact with patient to assess and evaluate symptoms and progress in treatment and Medication management   Gina [name] is a 67 year old female with a history of bipolar disorder admitted for another manic episode in the context of medication changes.  #Bipolar disorder -continue Invega sustenna injection 234 mg every 28 days, next dose on 9/3 -continue oral Invega 6 mg daily -continue Seroquel 300 mg  nightly -continue Depakote taper 500 mg for a week, 250 mg after due to thrombocytopenia -continue Lamictal titration 50 mg BID  #Thrombocytopenia  #Insomnia -Melatonin 10 mg nightly  #Hypothyroidism -Synthroid 50 ug daily  #HTN -continue Metoprolol 12.5 mg BID and Cozaar 50 mg daily  #DM -Tradjenta 5 mg daily  #Constipation -bowel regimen  #Dislipidemia  -Zocor 20 mg daily  #Knee pain -Voltaren gel  #Disposition -discharge to home with her sister -follow up with [facility]   Observation Level/Precautions:  15 minute checks  Laboratory:  CBC Chemistry Profile UDS UA  Psychotherapy:    Medications:    Consultations:    Discharge Concerns:    Estimated LOS:  Other:     Physician Treatment Plan for Primary Diagnosis: Bipolar I disorder, current or most recent episode manic, with psychotic features ([place]) Long Term Goal(s): Improvement in symptoms so as ready for discharge  Short Term Goals: Ability to identify changes in lifestyle to reduce recurrence of condition will improve, Ability to verbalize feelings will improve, Ability to disclose and discuss suicidal ideas, Ability to demonstrate self-control will improve, Ability to identify and develop effective coping behaviors will improve, Ability to maintain clinical measurements within normal limits will improve and Ability to identify triggers associated with substance abuse/mental health issues will improve  Physician Treatment Plan for Secondary Diagnosis: Principal Problem:   Bipolar I disorder, current or most recent episode manic, with psychotic features ([place]) Active Problems:   HTN (hypertension), benign   Osteoarthritis of knee   Controlled type 2 diabetes mellitus with diabetic nephropathy ([place])   Slow transit constipation   Acquired hypothyroidism  Long Term Goal(s): NA  Short Term Goals: NA  I certify that inpatient services furnished can reasonably be expected to improve the patient's  condition.    Orson Slick, MD 8/9/20193:11 PM

## 2018-03-12 NOTE — Progress Notes (Signed)
Recreation Therapy Notes  Date: 03/12/2018  Time: 9:30 am  Location: Craft Room  Behavioral response: Appropriate  Intervention Topic: Life Planning  Discussion/Intervention:  Group Content on today was focused on Life planning. The group described what plans are and how they go about making them. Individuals discussed how they normally plan their day and how long it takes. Patients described what is normally in their plans for the day and if they can plan a day with no money. The group expressed how much free time they normally have in a day. Individuals participated in the intervention "Making a Budget" and learned how much they were spending on things and ways to make plans for their money. Clinical Observations/Feedback:  Patient came into group late singing One day at a time. She participated in the intervention and was social with peers and staff during group.  Saniya Tranchina LRT/CTRS          Orilla Templeman 03/12/2018 12:20 PM

## 2018-03-12 NOTE — BHH Suicide Risk Assessment (Signed)
[facility] Admission Suicide Risk Assessment   Nursing information obtained from:  Patient Demographic factors:  Age 67 or older, Caucasian, Unemployed Current Mental Status:  NA Loss Factors:  NA Historical Factors:  NA Risk Reduction Factors:  NA, Religious beliefs about death  Total Time spent with patient: 45 minutes Principal Problem: Bipolar I disorder, current or most recent episode manic, with psychotic features ([place]) Diagnosis:   Patient Active Problem List   Diagnosis Date Noted  . Bipolar I disorder, current or most recent episode manic, with psychotic features ([place]) [F31.2] 11/04/2017    Priority: High  . Rash of hands [R21] 02/18/2018  . Pain of right heel [M79.671] 12/30/2017  . Encounter for chronic pain management [G89.29] 12/18/2017  . Foot pain, bilateral [id number], M79.672] 11/25/2017  . Gallbladder polyp [K82.4] 10/21/2017  . Left shoulder pain [M25.512] 10/21/2017  . Benign neoplasm of ascending colon [D12.2]   . Low back pain radiating to left lower extremity [M54.5, M79.605] 02/26/2017  . Acquired hypothyroidism [E03.9] 09/10/2016  . Thrombocytopenia ([place]) [D69.6] 06/15/2016  . Medicare annual wellness visit, subsequent [Z00.00] 06/04/2016  . Advanced care planning/counseling discussion [Z71.89] 06/04/2016  . External hemorrhoid [K64.4] 04/02/2016  . Slow transit constipation [K59.01] 01/04/2016  . Bipolar I disorder, most recent episode (or current) manic ([place]) [F31.10] 12/03/2015  . NAFLD (nonalcoholic fatty liver disease) [K76.0] 03/28/2015  . Gallbladder calculus without cholecystitis [K80.20] 03/28/2015  . Gout [M10.9] 03/27/2015  . HCV antibody positive [R76.8] 03/27/2015  . History of bladder cancer [Z85.51] 03/27/2015  . History of bundle branch block [Z86.79] 03/27/2015  . Genetic testing [Z13.79] 02/28/2015  . Loss of memory [R41.3] 09/04/2014  . Controlled type 2 diabetes mellitus with diabetic nephropathy ([place]) [E11.21] 06/07/2014  . Hyperlipidemia  [E78.5]   . Anemia in CKD (chronic kidney disease) [N18.9, D63.1] 01/26/2013  . Personal history of colonic adenomas and colon cancer [Z86.010] 11/11/2012  . Osteoarthritis of knee [M17.10] 10/02/2012  . OSA on CPAP [G47.33, Z99.89]   . CKD (chronic kidney disease) stage 4, GFR 15-29 ml/min (HCC) [N18.4]   . Severe obesity (BMI 35.0-39.9) with comorbidity ([place]) [E66.01]   . HTN (hypertension), benign [I10] 04/07/2012  . Hx of colon cancer, stage I [Z85.038] 08/05/2011   Subjective Data: mania  Continued Clinical Symptoms:  Alcohol Use Disorder Identification Test Final Score (AUDIT): 0 The "Alcohol Use Disorders Identification Test", Guidelines for Use in Primary Care, Second Edition.  World Pharmacologist [facility]). Score between 0-7:  no or low risk or alcohol related problems. Score between 8-15:  moderate risk of alcohol related problems. Score between 16-19:  high risk of alcohol related problems. Score 20 or above:  warrants further diagnostic evaluation for alcohol dependence and treatment.   CLINICAL FACTORS:   Bipolar Disorder:   Mixed State   Musculoskeletal: Strength & Muscle Tone: within normal limits Gait & Station: normal Patient leans: N/A  Psychiatric Specialty Exam: Physical Exam  Nursing note and vitals reviewed. Psychiatric: Her affect is labile and inappropriate. Her speech is rapid and/or pressured. She is hyperactive. Thought content is paranoid and delusional. Cognition and memory are impaired. She expresses impulsivity.    Review of Systems  Neurological: Negative.   Psychiatric/Behavioral: The patient has insomnia.   All other systems reviewed and are negative.   Blood pressure (!) 156/85, pulse (!) 108, temperature 97.6 F (36.4 C), temperature source Oral, resp. rate 19, height 5\' 3"  (1.6 m), weight 89.8 kg, SpO2 97 %.Body mass index is 35.07 kg/m.  General  Appearance: Casual  Eye Contact:  Good  Speech:  Pressured  Volume:  Increased  Mood:   Euphoric  Affect:  Congruent  Thought Process:  Disorganized and Descriptions of Associations: Tangential  Orientation:  Full (Time, Place, and Person)  Thought Content:  Delusions and Paranoid Ideation  Suicidal Thoughts:  No  Homicidal Thoughts:  No  Memory:  Immediate;   Poor Recent;   Poor Remote;   Poor  Judgement:  Poor  Insight:  Lacking  Psychomotor Activity:  Increased  Concentration:  Concentration: Poor and Attention Span: Poor  Recall:  Poor  Fund of Knowledge:  Fair  Language:  Fair  Akathisia:  No  Handed:  Right  AIMS (if indicated):     Assets:  Communication Skills Desire for Improvement Financial Resources/Insurance Housing Resilience Social Support  ADL's:  Intact  Cognition:  WNL  Sleep:  Number of Hours: 4.5      COGNITIVE FEATURES THAT CONTRIBUTE TO RISK:  None    SUICIDE RISK:   Minimal: No identifiable suicidal ideation.  Patients presenting with no risk factors but with morbid ruminations; may be classified as minimal risk based on the severity of the depressive symptoms  PLAN OF CARE: hospital admission, medication management, discharge planning.  Gina [name] is a 67 year old female with a history of bipolar disorder admitted for another manic episode in the context of medication changes.  #Bipolar disorder -continue Invega sustenna injection 234 mg every 28 days, next dose on 9/3 -continue oral Invega 6 mg daily -continue Seroquel 300 mg nightly -continue Depakote taper 500 mg for a week, 250 mg after due to thrombocytopenia -continue Lamictal titration 50 mg BID  #Insomnia -Melatonin 10 mg nightly  #Hypothyroidism -Synthroid 50 ug daily  #HTN -continue Metoprolol 12.5 mg BID and Cozaar 50 mg daily  #DM -Tradjenta 5 mg daily  #Constipation -bowel regimen  #Dislipidemia  -Zocor 20 mg daily  #Knee pain -Voltaren gel  #Disposition -discharge to home with her sister -follow up with [facility] ACT team  -   I certify that  inpatient services furnished can reasonably be expected to improve the patient's condition.   Orson Slick, MD 03/12/2018, 2:07 PM

## 2018-03-12 NOTE — Tx Team (Signed)
Initial Treatment Plan 03/12/2018 4:20 AM [name] DAP:[id number]    PATIENT STRESSORS: Health problems Medication change or noncompliance   PATIENT STRENGTHS: Ability for insight General fund of knowledge Motivation for treatment/growth   PATIENT IDENTIFIED PROBLEMS: Bipolar                      DISCHARGE CRITERIA:  Improved stabilization in mood, thinking, and/or behavior Motivation to continue treatment in a less acute level of care  PRELIMINARY DISCHARGE PLAN: Attend aftercare/continuing care group Return to previous living arrangement  PATIENT/FAMILY INVOLVEMENT: This treatment plan has been presented to and reviewed with the patient, Gina, [name] patient and family have been given the opportunity to ask questions and make suggestions.  Ailene Rud Sanel Stemmer, RN 03/12/2018, 4:20 AM

## 2018-03-12 NOTE — BHH Counselor (Signed)
Adult Comprehensive Assessment  Patient ID: Gina [name], female   DOB: [birth year], 67 y.o.   MRN: [id number]  Information Source: Information source: Patient(Review of medical record also)  Current Stressors:  Patient states their primary concerns and needs for treatment are:: "I need to get my medicince right.  I don't think the Depakote is right for me." Patient states their goals for this hospitilization and ongoing recovery are:: "Get my medicine right" Educational / Learning stressors: None noted Employment / Job issues: Pt does not work.  She receives disability benefits Family Relationships: Pt is very close with her sister.  She has good relationships with her two brothers although they live in other states/country Financial / Lack of resources (include bankruptcy): No issues noted Housing / Lack of housing: Housing is stable Physical health (include injuries & life threatening diseases): Pt has Arthritis and has to have surgery on her leg.  Pt has had cancer in the past. Social relationships: Pt does not go out the house much.  "I stay home with my sister" Substance abuse: Pt denies Bereavement / Loss: Nothing noted  Living/Environment/Situation:  Living Arrangements: Other relatives Living conditions (as described by patient or guardian): "good" Who else lives in the home?: Pt's sister How long has patient lived in current situation?: 6 years What is atmosphere in current home: Loving, Comfortable, Supportive  Family History:  Marital status: Single Are you sexually active?: No What is your sexual orientation?: Heterosexual Has your sexual activity been affected by drugs, alcohol, medication, or emotional stress?: No Does patient have children?: No  Childhood History:  By whom was/is the patient raised?: Both parents Additional childhood history information: "We are [place] so family is everything." Description of patient's relationship with caregiver when  they were a child: "I had great relationships with my parents" Patient's description of current relationship with people who raised him/her: Both of pt's parents are deceased How were you disciplined when you got in trouble as a child/adolescent?: "My mom would put me in the hospital" Does patient have siblings?: Yes Number of Siblings: 3 Description of patient's current relationship with siblings: Pt has 1 sister and 2 brothers.  She lives with her sister and they are very close.  She has one brother who lives in [place], [place] and the other lives in [place].  She talks to her brothers on occasion.   Did patient suffer any verbal/emotional/physical/sexual abuse as a child?: No Did patient suffer from severe childhood neglect?: No Has patient ever been sexually abused/assaulted/raped as an adolescent or adult?: No Was the patient ever a victim of a crime or a disaster?: No Witnessed domestic violence?: No Has patient been effected by domestic violence as an adult?: No  Education:  Highest grade of school patient has completed: GED Currently a Ship broker?: No Learning disability?: No  Employment/Work Situation:   Employment situation: On disability(Pt receives Forensic scientist) Why is patient on disability: n/a How long has patient been on disability: n/a Patient's job has been impacted by current illness: No What is the longest time patient has a held a job?: 30+ years Where was the patient employed at that time?: In a [employer] in [place], [place] Did You Receive Any Psychiatric Treatment/Services While in the [employer]?: No Are There Guns or Other Weapons in [place]?: No Are These Weapons Safely Secured?: (n/a)  Financial Resources:   Financial resources: Medicare([employer]) Does patient have a Programmer, applications or guardian?: No  Alcohol/Substance Abuse:  What has been your use of drugs/alcohol within the last 12 months?: Pt denies If  attempted suicide, did drugs/alcohol play a role in this?: No Alcohol/Substance Abuse Treatment Hx: Denies past history If yes, describe treatment: n/a Has alcohol/substance abuse ever caused legal problems?: No  Social Support System:   Patient's Community Support System: Poor Describe Community Support System: Pt does not go outside her home except for appointments.  She only interacts with her sister and professional staff members Type of faith/religion: Catholic How does patient's faith help to cope with current illness?: "I pray, do rosary, read Bible cards"  Leisure/Recreation:   Leisure and Hobbies: "Read the Bible and do word search puzzles"  Strengths/Needs:   What is the patient's perception of their strengths?: "I'm not really sure" Patient states they can use these personal strengths during their treatment to contribute to their recovery: "I'm not sure" Patient states these barriers may affect/interfere with their treatment: "Not getting on the right medication. I don't have anyone to help me except for my ACT Team Nurse" Patient states these barriers may affect their return to the community: Nothing stated Other important information patient would like considered in planning for their treatment: Nothing stated  Discharge Plan:   Currently receiving community mental health services: Yes (From Whom)(ACTT with [facility]) Patient states concerns and preferences for aftercare planning are: Nothing stated Patient states they will know when they are safe and ready for discharge when: "My medication is right" Does patient have access to transportation?: Yes Does patient have financial barriers related to discharge medications?: No Patient description of barriers related to discharge medications: Nothing noted Will patient be returning to same living situation after discharge?: Yes  Summary/Recommendations:   Summary and Recommendations (to be completed by the evaluator): Pt is a 67 yo  female with a hx of Bipolar Disorder, multiple hospitalizations, and medication trials.  Pt was brought to the ED by her ACTT due to manic behavior which included restlessness, not sleeping for days, calling her PCP several times, and talking to God.  Pt was disorganized in thought and claimed that she did not stay in the hospital long enough the last time she was in the BMU in April 2019.  Pt stated that the Depakote medication that she is on is not right for her.  No psychosis or SA.  Recommendations for pt include crisis stabilization, medication management, therapeutic milieu, encouragement of attendance and participation in groups, and development of a comprehensive wellness plan.  Discharge plan is for pt to return home and resume ACT services with [facility].  Devona Konig, LCSW 03/12/2018

## 2018-03-12 NOTE — Progress Notes (Addendum)
Admission Note:  66 yr female who presents voluntarily in no acute distress for the treatment of Mania .  Patient's affect is blunted, thoughts are disorganized, speech is pressured and she is hyper verbal.  Patient was coperative with admission process., and she denies SI/HI/AVHI. Patient was brought in by her ACT team for being Manic; not sleeping in days, pressured speech and religiously preoccupied. Patient stated " there was a medication change and that's why my mood has change"  Patient has Past medical Hx of DM, HTN, gout, anemia, hyperlipidemia, and Bipolar to mention a few. Patient's CBG was 94 upon admission, no distress noted, skin was assessed; warm and dry, old ABD scar noted, in addition she was searched in presence of Lobbyist and no contraband found, POC and unit policies explained and understanding verbalized. Consents obtained.fluid and food  offered, and fluids accepted. 15 minutes safety checks maintained will continue to monitor.

## 2018-03-12 NOTE — Progress Notes (Signed)
Recreation Therapy Notes  INPATIENT RECREATION THERAPY ASSESSMENT  Patient Details Name: Brenlyn Beshara MRN: [id number] DOB: July 29, 1951 Today's Date: 03/12/2018       Information Obtained From: Patient  Able to Participate in Assessment/Interview: Yes  Patient Presentation: Responsive, Hyperverbal  Reason for Admission (Per Patient): Active Symptoms  Patient Stressors:    Coping Skills:   Talk, Prayer  Leisure Interests (2+):  (Pray)  Frequency of Recreation/Participation: Weekly  Awareness of Community Resources:     [employer]:     Current Use:    If no, Barriers?:    Expressed Interest in [place]:    [place] of Residence:     Patient Main Form of Transportation:    Patient Strengths:     Patient Identified Areas of Improvement:     Patient Goal for Hospitalization:     Current SI (including self-harm):     Current HI:     Current AVH:    Staff Intervention Plan:    Consent to Intern Participation:    Suhayla Chisom 03/12/2018, 2:39 PM

## 2018-03-12 NOTE — Progress Notes (Signed)
Patient ID: Gina [name], female   DOB: 1951/06/19, 67 y.o.   MRN: [id number] PER STATE REGULATIONS 482.30  THIS CHART WAS REVIEWED FOR MEDICAL NECESSITY WITH RESPECT TO THE PATIENT'S ADMISSION/DURATION OF STAY.  NEXT REVIEW DATE: 03/15/18  Roma Schanz, RN, BSN CASE MANAGER

## 2018-03-12 NOTE — Plan of Care (Signed)
Patient was laughing loud this morning for simple things.Sitting in her room quietly leave the door opened states "I am claustrophobic".Patient denies SI,HI and AVH states "I am just in a good mood."Compliant with medications.Attended groups.Appetite and energy level good.Support and encouragement given.

## 2018-03-12 NOTE — BHH Group Notes (Signed)
03/12/2018 1PM  Type of Therapy and Topic:  Group Therapy:  Feelings around Relapse and Recovery  Participation Level:  Active   Description of Group:    Patients in this group will discuss emotions they experience before and after a relapse. They will process how experiencing these feelings, or avoidance of experiencing them, relates to having a relapse. Facilitator will guide patients to explore emotions they have related to recovery. Patients will be encouraged to process which emotions are more powerful. They will be guided to discuss the emotional reaction significant others in their lives may have to patients' relapse or recovery. Patients will be assisted in exploring ways to respond to the emotions of others without this contributing to a relapse.  Therapeutic Goals: 1. Patient will identify two or more emotions that lead to a relapse for them 2. Patient will identify two emotions that result when they relapse 3. Patient will identify two emotions related to recovery 4. Patient will demonstrate ability to communicate their needs through discussion and/or role plays   Summary of Patient Progress: Actively and appropriately engaged in the group. Patient was able to provide support and validation to other group members.Patient practiced active listening when interacting with the facilitator and other group members. Gina [name] said that worry led her to her relapse. She says "my worry turned into anxiety". Patient needed to be redirected during group but was easily redirectable. Patient is still in the process of obtaining treatment goals.      Therapeutic Modalities:   Cognitive Behavioral Therapy Solution-Focused Therapy Assertiveness Training Relapse Prevention Therapy   Darin Engels, [place] 03/12/2018 1:59 PM

## 2018-03-12 NOTE — Plan of Care (Addendum)
Patient found in common area upon my arrival. Patient is visible and social this evening. Patient denies all psychiatric complaints including SI/HI/AVH. Patient is pleasant and cooperative. Remains religiously preoccupied. Speech is pressured but logical. Reports eating adequately. Utilizing walker for ambulation. Complains of left knee pain, remains on Volteran cream with some relief reported. Compliant with HS medication and staff direction. Q 15 minute checks maintained. Will continue to monitor throughout the shift. Patient slept 5.75 hours. No apparent distress. Will endorse care to oncoming shift.  Problem: BHH Concurrent Medical Problem Goal: STG-Compliance with medication and/or treatment as ordered Description (STG-Compliance with medication and/or treatment as ordered by MD) Outcome: Progressing   Problem: BHH Concurrent Medical Problem Goal: STG-Compliance with medication and/or treatment as ordered Description (STG-Compliance with medication and/or treatment as ordered by MD) Outcome: Progressing Goal: STG-Patient will participate in management/stabilization Description (STG-Patient will participate in management/stabilization of medical condition) Outcome: Progressing   Problem: Education: Goal: Knowledge of the prescribed therapeutic regimen will improve Outcome: Progressing   Problem: Coping: Goal: Coping ability will improve Outcome: Progressing Goal: Will verbalize feelings Outcome: Progressing   Problem: Health Behavior/Discharge Planning: Goal: Compliance with prescribed medication regimen will improve Outcome: Progressing   Problem: Role Relationship: Goal: Ability to interact with others will improve Outcome: Progressing

## 2018-03-13 DIAGNOSIS — I10 Essential (primary) hypertension: Secondary | ICD-10-CM

## 2018-03-13 DIAGNOSIS — E1121 Type 2 diabetes mellitus with diabetic nephropathy: Secondary | ICD-10-CM

## 2018-03-13 LAB — BASIC METABOLIC PANEL
ANION GAP: 8 (ref 5–15)
BUN: 27 mg/dL — ABNORMAL HIGH (ref 8–23)
CHLORIDE: 95 mmol/L — AB (ref 98–111)
CO2: 31 mmol/L (ref 22–32)
CREATININE: 1.56 mg/dL — AB (ref 0.44–1.00)
Calcium: 9.5 mg/dL (ref 8.9–10.3)
GFR calc non Af Amer: 33 mL/min — ABNORMAL LOW (ref >60)
GFR, EST AFRICAN AMERICAN: 39 mL/min — AB (ref >60)
Glucose, Bld: 123 mg/dL — ABNORMAL HIGH (ref 70–99)
Potassium: 5.5 mmol/L — ABNORMAL HIGH (ref 3.5–5.1)
Sodium: 134 mmol/L — ABNORMAL LOW (ref 135–145)

## 2018-03-13 LAB — POTASSIUM: POTASSIUM: 5.5 mmol/L — AB (ref 3.5–5.1)

## 2018-03-13 LAB — GLUCOSE, CAPILLARY: Glucose-Capillary: 114 mg/dL — ABNORMAL HIGH (ref 70–99)

## 2018-03-13 MED ORDER — LAMOTRIGINE 25 MG PO TABS
25.0000 mg | ORAL_TABLET | Freq: Every day | ORAL | Status: DC
  Administered 2018-03-13 – 2018-03-15 (×3): 25 mg via ORAL
  Filled 2018-03-13 (×3): qty 1

## 2018-03-13 MED ORDER — IBUPROFEN 600 MG PO TABS
600.0000 mg | ORAL_TABLET | ORAL | Status: AC
  Administered 2018-03-14: 600 mg via ORAL
  Filled 2018-03-13: qty 1

## 2018-03-13 MED ORDER — SODIUM POLYSTYRENE SULFONATE 15 GM/60ML PO SUSP
15.0000 g | Freq: Two times a day (BID) | ORAL | Status: DC
  Administered 2018-03-13 – 2018-03-14 (×2): 15 g via ORAL
  Filled 2018-03-13 (×5): qty 60

## 2018-03-13 MED ORDER — INSULIN ASPART 100 UNIT/ML ~~LOC~~ SOLN
0.0000 [IU] | Freq: Three times a day (TID) | SUBCUTANEOUS | Status: DC
  Administered 2018-03-14: 1 [IU] via SUBCUTANEOUS
  Administered 2018-03-14: 2 [IU] via SUBCUTANEOUS
  Administered 2018-03-15 (×2): 1 [IU] via SUBCUTANEOUS
  Filled 2018-03-13 (×3): qty 1

## 2018-03-13 NOTE — Progress Notes (Signed)
Repeat K+ came back at 5.5. Will give Kayexelate and repeat K+ tomorrow

## 2018-03-13 NOTE — Progress Notes (Signed)
D: Patient stated slept good last night .Stated appetite is good and energy level  Is normal. Stated concentration is good . Stated she was depressed  Spoke of going back to work Denies suicidal  homicidal ideations  .  No auditory hallucinations Pain concerns with her knee. Marland Kitchen Appropriate ADL'S. Interacting with peers and staff.   Patient in dayroom  With peers  Thought process altered Medical issues addressed . Compliant  with unit programing . No concerns around  vital signs   or any other  medical concerns . Compliant  with medication . Information received in concrete form for better understanding . Working on Radiographer, therapeutic . Attending f group therapy for verbalization of feelings  about self  and other concerns .Marland Kitchen Appetite good  at meals . No anger outburst   A: Encourage patient participation with unit programming . Instruction  Given on  Medication , verbalize understanding. R: Voice no other concerns. Staff continue to monitor

## 2018-03-13 NOTE — Plan of Care (Signed)
Medical issues addressed . Compliant  with unit programing . No concerns around  vital signs   or any other  medical concerns . Compliant  with medication . Information received in concrete form for better understanding . Working on Radiographer, therapeutic . Attending f group therapy for verbalization of feelings  about self  and other concerns .Marland Kitchen Appetite good  at meals . No anger outburst   Problem: Consults Goal: Concurrent Medical Patient Education Description (See Patient Education Module for education specifics) Outcome: Progressing   Problem: [facility] Concurrent Medical Problem Goal: LTG-Pt will be physically stable and he/significant other Description (Patient will be physically stable and he/significant other will be able to verbalize understanding of follow-up care and symptoms that would warrant further treatment) Outcome: Progressing Goal: STG-Vital signs will be within defined limits or stabilized Description (STG- Vital signs will be within defined limits or stabilized for individual) Outcome: Progressing Goal: STG-Compliance with medication and/or treatment as ordered Description (STG-Compliance with medication and/or treatment as ordered by MD) Outcome: Progressing Goal: STG-Verbalize two symptoms that would warrant further Description (STG-Verbalize two symptoms that would warrant further treatment) Outcome: Progressing Goal: STG-Patient will participate in management/stabilization Description (STG-Patient will participate in management/stabilization of medical condition) Outcome: Progressing Goal: STG-Other (Specify): Description STG-Other Concurrent Medical (Specify): Outcome: Progressing   Problem: Consults Goal: Concurrent Medical Patient Education Description (See Patient Education Module for education specifics) Outcome: Progressing   Problem: [facility] Concurrent Medical Problem Goal: LTG-Pt will be physically stable and he/significant other Description (Patient will be  physically stable and he/significant other will be able to verbalize understanding of follow-up care and symptoms that would warrant further treatment) Outcome: Progressing Goal: STG-Vital signs will be within defined limits or stabilized Description (STG- Vital signs will be within defined limits or stabilized for individual) Outcome: Progressing Goal: STG-Compliance with medication and/or treatment as ordered Description (STG-Compliance with medication and/or treatment as ordered by MD) Outcome: Progressing Goal: STG-Verbalize two symptoms that would warrant further Description (STG-Verbalize two symptoms that would warrant further treatment) Outcome: Progressing Goal: STG-Patient will participate in management/stabilization Description (STG-Patient will participate in management/stabilization of medical condition) Outcome: Progressing Goal: STG-Other (Specify): Description STG-Other Concurrent Medical (Specify): Outcome: Progressing   Problem: Education: Goal: Knowledge of the prescribed therapeutic regimen will improve Outcome: Progressing   Problem: Coping: Goal: Coping ability will improve Outcome: Progressing Goal: Will verbalize feelings Outcome: Progressing   Problem: Health Behavior/Discharge Planning: Goal: Compliance with prescribed medication regimen will improve Outcome: Progressing   Problem: Nutritional: Goal: Ability to achieve adequate nutritional intake will improve Outcome: Progressing   Problem: Role Relationship: Goal: Ability to communicate needs accurately will improve Outcome: Progressing Goal: Ability to interact with others will improve Outcome: Progressing   Problem: Safety: Goal: Ability to redirect hostility and anger into socially appropriate behaviors will improve Outcome: Progressing Goal: Ability to remain free from injury will improve Outcome: Progressing   Problem: Self-Care: Goal: Ability to participate in self-care as  condition permits will improve Outcome: Progressing   Problem: Self-Concept: Goal: Will verbalize positive feelings about self Outcome: Progressing

## 2018-03-13 NOTE — [facility] Group Notes (Signed)
LCSW Group Therapy Note   03/13/2018 1:15pm   Type of Therapy and Topic:  Group Therapy:  Trust and Honesty  Participation Level:  Did Not Attend  Description of Group:    In this group patients will be asked to explore the value of being honest.  Patients will be guided to discuss their thoughts, feelings, and behaviors related to honesty and trusting in others. Patients will process together how trust and honesty relate to forming relationships with peers, family members, and self. Each patient will be challenged to identify and express feelings of being vulnerable. Patients will discuss reasons why people are dishonest and identify alternative outcomes if one was truthful (to self or others). This group will be process-oriented, with patients participating in exploration of their own experiences, giving and receiving support, and processing challenge from other group members.   Therapeutic Goals: 1. Patient will identify why honesty is important to relationships and how honesty overall affects relationships.  2. Patient will identify a situation where they lied or were lied too and the  feelings, thought process, and behaviors surrounding the situation 3. Patient will identify the meaning of being vulnerable, how that feels, and how that correlates to being honest with self and others. 4. Patient will identify situations where they could have told the truth, but instead lied and explain reasons of dishonesty.   Summary of Patient Progress: Pt was invited to attend group but chose not to attend. CSW will continue to encourage pt to attend group throughout their admission.     Therapeutic Modalities:   Cognitive Behavioral Therapy Solution Focused Therapy Motivational Interviewing Brief Therapy  Beverley Sherrard  CUEBAS-COLON, LCSW 03/13/2018 12:46 PM

## 2018-03-13 NOTE — Progress Notes (Addendum)
[facility] MD Progress Note  03/13/2018 7:30 PM Gina [name]  MRN:  [id number]    Subjective:   Patient remains hypomanic, laughing inappropriately and was somewhat euphoric.  She has been compliant with psychotropic medications and so far denies any adverse side effects.  She denies any current severe depressive symptoms or suicidal thoughts.  She does have excessive worry about her sister possibly passing away which is her only source of support.  Thought processes were tangential and had to be redirected during the conversation multiple times.  She denies any auditory or visual hallucinations.  No paranoid thoughts or delusions.  She slept over 5 hours last night.  Blood pressure was elevated this morning and will monitor more closely.  Appetite is good and she has been social in the day room, interacting well with staff and peers.  Past psychiatric history. Long history of bipolar illness with multiple hospitalization for mostly manic episodes. Difficult to treat, Her primary psychiatrist, Dr. Holley Raring of [facility] added oral Invega to her injections, and has been cross tapering Depakote for Lamictal. There were no suicide attempts.  Family psychiatric history. Sister with intellectual disability.  Social history. Disabled from mental illness. Lives with her sister.     Principal Problem: Bipolar I disorder, current or most recent episode manic, with psychotic features ([place]) Diagnosis:   Patient Active Problem List   Diagnosis Date Noted  . Rash of hands [R21] 02/18/2018  . Pain of right heel [M79.671] 12/30/2017  . Encounter for chronic pain management [G89.29] 12/18/2017  . Foot pain, bilateral [id number], M79.672] 11/25/2017  . Bipolar I disorder, current or most recent episode manic, with psychotic features ([place]) [F31.2] 11/04/2017  . Gallbladder polyp [K82.4] 10/21/2017  . Left shoulder pain [M25.512] 10/21/2017  . Benign neoplasm of ascending colon [D12.2]   . Low back  pain radiating to left lower extremity [M54.5, M79.605] 02/26/2017  . Acquired hypothyroidism [E03.9] 09/10/2016  . Thrombocytopenia ([place]) [D69.6] 06/15/2016  . Medicare annual wellness visit, subsequent [Z00.00] 06/04/2016  . Advanced care planning/counseling discussion [Z71.89] 06/04/2016  . External hemorrhoid [K64.4] 04/02/2016  . Slow transit constipation [K59.01] 01/04/2016  . Bipolar I disorder, most recent episode (or current) manic ([place]) [F31.10] 12/03/2015  . NAFLD (nonalcoholic fatty liver disease) [K76.0] 03/28/2015  . Gallbladder calculus without cholecystitis [K80.20] 03/28/2015  . Gout [M10.9] 03/27/2015  . HCV antibody positive [R76.8] 03/27/2015  . History of bladder cancer [Z85.51] 03/27/2015  . History of bundle branch block [Z86.79] 03/27/2015  . Genetic testing [Z13.79] 02/28/2015  . Loss of memory [R41.3] 09/04/2014  . Controlled type 2 diabetes mellitus with diabetic nephropathy ([place]) [E11.21] 06/07/2014  . Hyperlipidemia [E78.5]   . Anemia in CKD (chronic kidney disease) [N18.9, D63.1] 01/26/2013  . Personal history of colonic adenomas and colon cancer [Z86.010] 11/11/2012  . Osteoarthritis of knee [M17.10] 10/02/2012  . OSA on CPAP [G47.33, Z99.89]   . CKD (chronic kidney disease) stage 4, GFR 15-29 ml/min (HCC) [N18.4]   . Severe obesity (BMI 35.0-39.9) with comorbidity ([place]) [E66.01]   . HTN (hypertension), benign [I10] 04/07/2012  . Hx of colon cancer, stage I [Z85.038] 08/05/2011   Total Time spent with patient: 20 minutes    Past Medical History:  Past Medical History:  Diagnosis Date  . Anemia in chronic kidney disease   . Bipolar depression [facility])    sees psychiatrist - psych admission 03/2015  . Bladder cancer ([place]) 1990's  . Blood transfusion without reported diagnosis 2009  . Cataract  left  . CKD (chronic kidney disease) stage 3, GFR 30-59 ml/min (HCC)   . Colon cancer ([place]) 1990's  . DJD (degenerative joint disease), lumbar    chronic  lower back pain  . Enterocutaneous fistula 04/07/2012   completed PT 06/2012 ([facility])  . Family history of breast cancer   . Family history of colon cancer   . Family history of stomach cancer   . GERD (gastroesophageal reflux disease)   . History of bladder cancer 1997  . History of colon cancer    s/p surgery  . History of uterine cancer    s/p hysterectomy  . Hyperlipidemia   . Hypertension   . IDA (iron deficiency anemia) 01/2013   thought due to h/o polyps  . Obesity (BMI 30-39.9)   . OSA on CPAP    6cm H2O  . Personal history of colonic adenomas and colon cancer 11/11/2012  . Positive hepatitis C antibody test 09/2014   but negative confirmatory testing  . RBBB   . Uncontrolled type 2 diabetes mellitus with nephropathy ([place]) 06/07/2014   completed DSME 02/2016    Past Surgical History:  Procedure Laterality Date  . BREAST BIOPSY Right 01/2014   fibroadenoma  . COLONOSCOPY  07/2009  . COLONOSCOPY  11/2012   2 tubular adenomas, mild diverticulosis, pending genetic testing for Lynch syndrome [name]) rpt 2 yrs  . COLONOSCOPY  02/2015   TA, diverticulosis, rpt 2 yrs [name])  . COLONOSCOPY WITH PROPOFOL N/A 06/09/2017   TAx1, rpt 2 yrs [name], [name], MD)  . DEXA  12/2009   WNL  . DOBUTAMINE STRESS ECHO  12/2009   no evidence of ischemia  . HERNIA REPAIR  02/04/12  . i & d abdominal wound  02/19/12  . LEFT OOPHORECTOMY  2005  . PARTIAL COLECTOMY  about 2008   for colon cancer  . PARTIAL HYSTERECTOMY  1981   uterine cancer, R ovary remains  . Reexploration of abdominal wound and Allograft placemet  02/26/12  . Removal of infected mesh and abdominal wound vac placement  02/24/12  . sleep study  02/2014   OSA - AHI 55, nadir 81% [name])   Family History:  Family History  Problem Relation Age of Onset  . Colon cancer Mother 63  . Stomach cancer Mother        dx in her 42s?  . CAD Father        MI; deceased 84  . Mental illness Sister        anxiety/depression  .  Thyroid disease Sister   . Breast cancer Maternal Grandmother        age at dx unknown  . Diabetes Brother   . Diabetes Brother   . Diabetes Other        aunts and uncles both sides  . Arthritis Other        strong fmhx  . Colon cancer Other 37       maternal half-sister; deceased   Family Psychiatric  History: See H+P Social History:  Social History   Substance and Sexual Activity  Alcohol Use No  . Alcohol/week: 0.0 standard drinks     Social History   Substance and Sexual Activity  Drug Use No    Social History   Socioeconomic History  . Marital status: Single    Spouse name: Not on file  . Number of children: 0  . Years of education: Not on file  . Highest education level: Not on file  Occupational History  . Not on file  Social Needs  . Financial resource strain: Not on file  . Food insecurity:    Worry: Not on file    Inability: Not on file  . Transportation needs:    Medical: Not on file    Non-medical: Not on file  Tobacco Use  . Smoking status: Never Smoker  . Smokeless tobacco: Never Used  Substance and Sexual Activity  . Alcohol use: No    Alcohol/week: 0.0 standard drinks  . Drug use: No  . Sexual activity: Never  Lifestyle  . Physical activity:    Days per week: Not on file    Minutes per session: Not on file  . Stress: Not on file  Relationships  . Social connections:    Talks on phone: Not on file    Gets together: Not on file    Attends religious service: Not on file    Active member of club or organization: Not on file    Attends meetings of clubs or organizations: Not on file    Relationship status: Not on file  Other Topics Concern  . Not on file  Social History Narrative   Lives with sister, no pets   Occupation: disabled, for bipolar and arthritis   Edu: GED   Activity: take walks   Diet: good water, vegetables daily   Religion: [place], Dr. Jimmye Norman (ph [phone])    Sleep:  Good  Appetite:  Good  Current Medications: Current Facility-Administered Medications  Medication Dose Route Frequency Provider Last Rate Last Dose  . acetaminophen (TYLENOL) tablet 650 mg  650 mg Oral Q6H PRN Pucilowska, Jolanta B, MD   650 mg at 03/12/18 1305  . alum & mag hydroxide-simeth (MAALOX/MYLANTA) 200-200-20 MG/5ML suspension 30 mL  30 mL Oral Q4H PRN Pucilowska, Jolanta B, MD      . bisacodyl (DULCOLAX) suppository 10 mg  10 mg Rectal Daily PRN Pucilowska, Jolanta B, MD      . colchicine tablet 0.6 mg  0.6 mg Oral Daily Pucilowska, Jolanta B, MD   0.6 mg at 03/13/18 0810  . diclofenac sodium (VOLTAREN) 1 % transdermal gel 4 g  4 g Topical QID Pucilowska, Jolanta B, MD   4 g at 03/13/18 1657  . divalproex (DEPAKOTE) DR tablet 250 mg  250 mg Oral BID WC Pucilowska, Jolanta B, MD   250 mg at 03/13/18 1657  . docusate sodium (COLACE) capsule 200 mg  200 mg Oral BID Pucilowska, Jolanta B, MD   200 mg at 03/13/18 1657  . levothyroxine (SYNTHROID, LEVOTHROID) tablet 50 mcg  50 mcg Oral QAC breakfast Pucilowska, Jolanta B, MD   50 mcg at 03/13/18 0719  . linagliptin (TRADJENTA) tablet 5 mg  5 mg Oral Daily Pucilowska, Jolanta B, MD   5 mg at 03/13/18 0811  . losartan (COZAAR) tablet 50 mg  50 mg Oral Daily Pucilowska, Jolanta B, MD   50 mg at 03/13/18 0812  . magnesium hydroxide (MILK OF MAGNESIA) suspension 30 mL  30 mL Oral Daily PRN Pucilowska, Jolanta B, MD   30 mL at 03/12/18 1735  . Melatonin TABS 10 mg  10 mg Oral QHS Pucilowska, Jolanta B, MD   10 mg at 03/12/18 2129  . metoprolol tartrate (LOPRESSOR) tablet 12.5 mg  12.5 mg Oral BID Pucilowska, Jolanta B, MD   12.5 mg at 03/13/18 1657  . [START ON 04/06/2018] paliperidone (INVEGA  SUSTENNA) injection 234 mg  234 mg Intramuscular Q28 days Pucilowska, Jolanta B, MD      . paliperidone (INVEGA) 24 hr tablet 6 mg  6 mg Oral Daily Pucilowska, Jolanta B, MD   6 mg at 03/13/18 0807  . QUEtiapine (SEROQUEL) tablet 300 mg  300 mg Oral QHS  Pucilowska, Jolanta B, MD   300 mg at 03/12/18 2129  . senna (SENOKOT) tablet 8.6 mg  1 tablet Oral QHS PRN Pucilowska, Jolanta B, MD   8.6 mg at 03/12/18 0020  . simvastatin (ZOCOR) tablet 20 mg  20 mg Oral QHS Pucilowska, Jolanta B, MD   20 mg at 03/12/18 2130    Lab Results: No results found for this or any previous visit (from the past 48 hour(s)).  Blood Alcohol level:  Lab Results  Component Value Date   ETH <10 03/11/2018   ETH <10 58/72/7618    Metabolic Disorder Labs: Lab Results  Component Value Date   HGBA1C 6.6 (H) 02/18/2018   MPG 122.63 11/14/2017   MPG 114 04/14/2012   Lab Results  Component Value Date   PROLACTIN 33.6 (H) 12/04/2015   Lab Results  Component Value Date   CHOL 140 11/14/2017   TRIG 152 (H) 11/14/2017   HDL 50 11/14/2017   CHOLHDL 2.8 11/14/2017   VLDL 30 11/14/2017   LDLCALC 60 11/14/2017   LDLCALC 81 09/30/2017    Physical Findings: AIMS: Facial and Oral Movements Muscles of Facial Expression: None, normal Lips and Perioral Area: None, normal Jaw: None, normal Tongue: None, normal,Extremity Movements Upper (arms, wrists, hands, fingers): None, normal Lower (legs, knees, ankles, toes): None, normal, Trunk Movements Neck, shoulders, hips: None, normal, Overall Severity Severity of abnormal movements (highest score from questions above): None, normal Incapacitation due to abnormal movements: None, normal Patient's awareness of abnormal movements (rate only patient's report): No Awareness, Dental Status Current problems with teeth and/or dentures?: Yes Does patient usually wear dentures?: Yes(upper and bottom dentures)  CIWA:    COWS:  COWS Total Score: 0  Musculoskeletal: Strength & Muscle Tone: within normal limits Gait & Station: normal Patient leans: N/A  Psychiatric Specialty Exam: Physical Exam  Nursing note and vitals reviewed.   Review of Systems  Constitutional: Negative.   HENT: Negative.   Eyes: Negative.    Respiratory: Negative.   Cardiovascular: Negative.   Gastrointestinal: Negative.   Musculoskeletal:       Left knee pain, chronic  Skin: Negative.   Neurological: Negative.     Blood pressure (!) 147/81, pulse 80, temperature 97.6 F (36.4 C), temperature source Oral, resp. rate 19, height 5' 3"  (1.6 m), weight 89.8 kg, SpO2 98 %.Body mass index is 35.07 kg/m.  General Appearance: Casual  Eye Contact:  Good  Speech:  Clear and Coherent and Pressured  Volume:  Normal  Mood:  Anxious and Euprhoic  Affect:  Labile  Thought Process:  Descriptions of Associations: Tangential  Orientation:  Full (Time, Place, and Person)  Thought Content:  Tangential  Suicidal Thoughts:  No  Homicidal Thoughts:  No  Memory:  Immediate;   Good  Judgement:  Fair  Insight:  Fair  Psychomotor Activity:  Normal  Concentration:  Concentration: Fair and Attention Span: Fair  Recall:  Stout of Knowledge:  Good  Language:  Good  Akathisia:  No  Handed:  Right  AIMS (if indicated):     Assets:  Agricultural consultant Housing  ADL's:  Intact  Cognition:  WNL  Sleep:  Number of Hours: 5.75     Treatment Plan Summary:  Ms. Sermeno is a 67 year old female with a history of bipolar disorder admitted for another manic episode in the context of medication changes.  #Bipolar disorder -Patient remains manic. Will continue to transition medications -continue Invega sustenna injection 234 mg every 28 days, next dose on 9/3 -continue oral Invega 6 mg daily -continue Seroquel 300 mg nightly -continue Depakote taper 500 mg for a week, 250 mg after due to thrombocytopenia - start Lamictal 45m po daily for mood stabilization. Patient made aware of risk of SKathreen CosierSyndrome -Hemoglobin A1c was 6.6 and total cholesterol was 140  #Thrombocytopenia -Patient is being tapered off of Depakote  #Insomnia -Continue melatonin 10 mg p.o.  nightly   #Hypothyroidism -Continue Synthroid 50 mcg p.o. daily   #HTN -Continue metoprolol 12.5 mg p.o. twice daily and Cozaar 50 mg p.o. daily   #DM -Tradjenta 5 mg daily -Will check BS and place on SSI - Will place on diabetic diet  #Constipation -bowel regimen  #Dislipidemia  -Zocor 20 mg daily  #Knee pain -Voltaren gel  #Disposition -discharge to home with her sister -follow up with [facility]   Daily contact with patient to assess and evaluate symptoms and progress in treatment and Medication management  AChauncey Mann MD 03/13/2018, 7:30 PM

## 2018-03-14 LAB — GLUCOSE, CAPILLARY
GLUCOSE-CAPILLARY: 156 mg/dL — AB (ref 70–99)
GLUCOSE-CAPILLARY: 109 mg/dL — AB (ref 70–99)
GLUCOSE-CAPILLARY: 129 mg/dL — AB (ref 70–99)
Glucose-Capillary: 175 mg/dL — ABNORMAL HIGH (ref 70–99)

## 2018-03-14 LAB — BASIC METABOLIC PANEL
ANION GAP: 12 (ref 5–15)
BUN: 29 mg/dL — ABNORMAL HIGH (ref 8–23)
CALCIUM: 8.9 mg/dL (ref 8.9–10.3)
CO2: 27 mmol/L (ref 22–32)
Chloride: 96 mmol/L — ABNORMAL LOW (ref 98–111)
Creatinine, Ser: 1.73 mg/dL — ABNORMAL HIGH (ref 0.44–1.00)
GFR, EST AFRICAN AMERICAN: 34 mL/min — AB (ref >60)
GFR, EST NON AFRICAN AMERICAN: 29 mL/min — AB (ref >60)
Glucose, Bld: 155 mg/dL — ABNORMAL HIGH (ref 70–99)
Potassium: 4.5 mmol/L (ref 3.5–5.1)
SODIUM: 135 mmol/L (ref 135–145)

## 2018-03-14 NOTE — Progress Notes (Signed)
Patient ID: Gina [name], female   DOB: 1951-02-18, 67 y.o.   MRN: [id number]  CSW contacted [facility] at [phone] (24-hour nurse line), spoke with Claiborne Billings, provided information about recent pt's admission to [facility] and requested a consult for the patient.     Cheree Ditto, LCSWA 03/14/2018 AT 10:40AM

## 2018-03-14 NOTE — Progress Notes (Signed)
D: Patient stated slept  fair last night .Stated appetite is good and energy level  Is normal. Stated concentration poor . Stated on Depression scale  10, hopeless 10.( low 0-10 high) Denies suicidal  homicidal ideations  .  No auditory hallucinations  , pain concerns with left knee . Appropriate ADL'S. Interacting with peers and staff. Compliant with unit programing . No concerns around vital signs or any other medical concerns . Compliant with medication . Information received in concrete form for better understanding . Working on Radiographer, therapeutic . Attending  group therapy for verbalization of feelings about self and other concerns  Appetite good at meals . No anger outburst  Patient  Remains manic.  A: Encourage patient participation with unit programming . Instruction  Given on  Medication , verbalize understanding.  R: Voice no other concerns. Staff continue to monitor

## 2018-03-14 NOTE — Progress Notes (Signed)
[facility] MD Progress Note  03/14/2018 8:30 AM Gina [name]  MRN:  [id number]    Subjective:   The patient does appear to be less labile at this morning, a little calmer but is still having racing thoughts and speech is tangential.  She says she would never kill herself and denies any suicidal thoughts.  She says she could not "take the heat" in heaven or hell.  She denies any current active psychotic symptoms including auditory or visual hallucinations.  She is reporting problems with focus and concentration and some dizzy spells.  Blood pressure has been elevated.  Potassium was also elevated and she received Kayexalate yesterday.  No other somatic complaints.  Appetite is good and she is socializing on the unit.  She is attending groups and interacting well with staff and peers.  She has been in communication with her sister.  Past psychiatric history. Long history of bipolar illness with multiple hospitalization for mostly manic episodes. Difficult to treat, Her primary psychiatrist, Dr. Holley Raring of [facility] added oral Invega to her injections, and has been cross tapering Depakote for Lamictal. There were no suicide attempts.  Family psychiatric history. Sister with intellectual disability.  Social history. Disabled from mental illness. Lives with her sister.     Principal Problem: Bipolar I disorder, current or most recent episode manic, with psychotic features ([place]) Diagnosis:   Patient Active Problem List   Diagnosis Date Noted  . Rash of hands [R21] 02/18/2018  . Pain of right heel [M79.671] 12/30/2017  . Encounter for chronic pain management [G89.29] 12/18/2017  . Foot pain, bilateral [id number], M79.672] 11/25/2017  . Bipolar I disorder, current or most recent episode manic, with psychotic features ([place]) [F31.2] 11/04/2017  . Gallbladder polyp [K82.4] 10/21/2017  . Left shoulder pain [M25.512] 10/21/2017  . Benign neoplasm of ascending colon [D12.2]   . Low back pain  radiating to left lower extremity [M54.5, M79.605] 02/26/2017  . Acquired hypothyroidism [E03.9] 09/10/2016  . Thrombocytopenia ([place]) [D69.6] 06/15/2016  . Medicare annual wellness visit, subsequent [Z00.00] 06/04/2016  . Advanced care planning/counseling discussion [Z71.89] 06/04/2016  . External hemorrhoid [K64.4] 04/02/2016  . Slow transit constipation [K59.01] 01/04/2016  . Bipolar I disorder, most recent episode (or current) manic ([place]) [F31.10] 12/03/2015  . NAFLD (nonalcoholic fatty liver disease) [K76.0] 03/28/2015  . Gallbladder calculus without cholecystitis [K80.20] 03/28/2015  . Gout [M10.9] 03/27/2015  . HCV antibody positive [R76.8] 03/27/2015  . History of bladder cancer [Z85.51] 03/27/2015  . History of bundle branch block [Z86.79] 03/27/2015  . Genetic testing [Z13.79] 02/28/2015  . Loss of memory [R41.3] 09/04/2014  . Controlled type 2 diabetes mellitus with diabetic nephropathy ([place]) [E11.21] 06/07/2014  . Hyperlipidemia [E78.5]   . Anemia in CKD (chronic kidney disease) [N18.9, D63.1] 01/26/2013  . Personal history of colonic adenomas and colon cancer [Z86.010] 11/11/2012  . Osteoarthritis of knee [M17.10] 10/02/2012  . OSA on CPAP [G47.33, Z99.89]   . CKD (chronic kidney disease) stage 4, GFR 15-29 ml/min (HCC) [N18.4]   . Severe obesity (BMI 35.0-39.9) with comorbidity ([place]) [E66.01]   . HTN (hypertension), benign [I10] 04/07/2012  . Hx of colon cancer, stage I [Z85.038] 08/05/2011   Total Time spent with patient: 20 minutes    Past Medical History:  Past Medical History:  Diagnosis Date  . Anemia in chronic kidney disease   . Bipolar depression [facility])    sees psychiatrist - psych admission 03/2015  . Bladder cancer ([place]) 1990's  . Blood transfusion without  reported diagnosis 2009  . Cataract    left  . CKD (chronic kidney disease) stage 3, GFR 30-59 ml/min (HCC)   . Colon cancer ([place]) 1990's  . DJD (degenerative joint disease), lumbar    chronic lower  back pain  . Enterocutaneous fistula 04/07/2012   completed PT 06/2012 ([facility])  . Family history of breast cancer   . Family history of colon cancer   . Family history of stomach cancer   . GERD (gastroesophageal reflux disease)   . History of bladder cancer 1997  . History of colon cancer    s/p surgery  . History of uterine cancer    s/p hysterectomy  . Hyperlipidemia   . Hypertension   . IDA (iron deficiency anemia) 01/2013   thought due to h/o polyps  . Obesity (BMI 30-39.9)   . OSA on CPAP    6cm H2O  . Personal history of colonic adenomas and colon cancer 11/11/2012  . Positive hepatitis C antibody test 09/2014   but negative confirmatory testing  . RBBB   . Uncontrolled type 2 diabetes mellitus with nephropathy ([place]) 06/07/2014   completed DSME 02/2016    Past Surgical History:  Procedure Laterality Date  . BREAST BIOPSY Right 01/2014   fibroadenoma  . COLONOSCOPY  07/2009  . COLONOSCOPY  11/2012   2 tubular adenomas, mild diverticulosis, pending genetic testing for Lynch syndrome [name]) rpt 2 yrs  . COLONOSCOPY  02/2015   TA, diverticulosis, rpt 2 yrs [name])  . COLONOSCOPY WITH PROPOFOL N/A 06/09/2017   TAx1, rpt 2 yrs [name], [name], MD)  . DEXA  12/2009   WNL  . DOBUTAMINE STRESS ECHO  12/2009   no evidence of ischemia  . HERNIA REPAIR  02/04/12  . i & d abdominal wound  02/19/12  . LEFT OOPHORECTOMY  2005  . PARTIAL COLECTOMY  about 2008   for colon cancer  . PARTIAL HYSTERECTOMY  1981   uterine cancer, R ovary remains  . Reexploration of abdominal wound and Allograft placemet  02/26/12  . Removal of infected mesh and abdominal wound vac placement  02/24/12  . sleep study  02/2014   OSA - AHI 55, nadir 81% [name])   Family History:  Family History  Problem Relation Age of Onset  . Colon cancer Mother 78  . Stomach cancer Mother        dx in her 4s?  . CAD Father        MI; deceased 42  . Mental illness Sister        anxiety/depression  . Thyroid  disease Sister   . Breast cancer Maternal Grandmother        age at dx unknown  . Diabetes Brother   . Diabetes Brother   . Diabetes Other        aunts and uncles both sides  . Arthritis Other        strong fmhx  . Colon cancer Other 68       maternal half-sister; deceased   Family Psychiatric  History: See H+P Social History:  Social History   Substance and Sexual Activity  Alcohol Use No  . Alcohol/week: 0.0 standard drinks     Social History   Substance and Sexual Activity  Drug Use No    Social History   Socioeconomic History  . Marital status: Single    Spouse name: Not on file  . Number of children: 0  . Years of education: Not on file  .  Highest education level: Not on file  Occupational History  . Not on file  Social Needs  . Financial resource strain: Not on file  . Food insecurity:    Worry: Not on file    Inability: Not on file  . Transportation needs:    Medical: Not on file    Non-medical: Not on file  Tobacco Use  . Smoking status: Never Smoker  . Smokeless tobacco: Never Used  Substance and Sexual Activity  . Alcohol use: No    Alcohol/week: 0.0 standard drinks  . Drug use: No  . Sexual activity: Never  Lifestyle  . Physical activity:    Days per week: Not on file    Minutes per session: Not on file  . Stress: Not on file  Relationships  . Social connections:    Talks on phone: Not on file    Gets together: Not on file    Attends religious service: Not on file    Active member of club or organization: Not on file    Attends meetings of clubs or organizations: Not on file    Relationship status: Not on file  Other Topics Concern  . Not on file  Social History Narrative   Lives with sister, no pets   Occupation: disabled, for bipolar and arthritis   Edu: GED   Activity: take walks   Diet: good water, vegetables daily   Religion: [place], Dr. Jimmye Norman (ph [phone])    Sleep:  Good  Appetite:  Good  Current Medications: Current Facility-Administered Medications  Medication Dose Route Frequency Provider Last Rate Last Dose  . acetaminophen (TYLENOL) tablet 650 mg  650 mg Oral Q6H PRN Pucilowska, Jolanta B, MD   650 mg at 03/13/18 2223  . alum & mag hydroxide-simeth (MAALOX/MYLANTA) 200-200-20 MG/5ML suspension 30 mL  30 mL Oral Q4H PRN Pucilowska, Jolanta B, MD      . bisacodyl (DULCOLAX) suppository 10 mg  10 mg Rectal Daily PRN Pucilowska, Jolanta B, MD      . colchicine tablet 0.6 mg  0.6 mg Oral Daily Pucilowska, Jolanta B, MD   0.6 mg at 03/14/18 0742  . diclofenac sodium (VOLTAREN) 1 % transdermal gel 4 g  4 g Topical QID Pucilowska, Jolanta B, MD   4 g at 03/14/18 0741  . divalproex (DEPAKOTE) DR tablet 250 mg  250 mg Oral BID WC Pucilowska, Jolanta B, MD   250 mg at 03/14/18 0742  . docusate sodium (COLACE) capsule 200 mg  200 mg Oral BID Pucilowska, Jolanta B, MD   200 mg at 03/14/18 0742  . insulin aspart (novoLOG) injection 0-9 Units  0-9 Units Subcutaneous TID WC Chauncey Mann, MD   2 Units at 03/14/18 [phone]  . lamoTRIgine (LAMICTAL) tablet 25 mg  25 mg Oral Daily Chauncey Mann, MD   25 mg at 03/14/18 0741  . levothyroxine (SYNTHROID, LEVOTHROID) tablet 50 mcg  50 mcg Oral QAC breakfast Pucilowska, Jolanta B, MD   50 mcg at 03/14/18 0746  . linagliptin (TRADJENTA) tablet 5 mg  5 mg Oral Daily Pucilowska, Jolanta B, MD   5 mg at 03/14/18 0742  . losartan (COZAAR) tablet 50 mg  50 mg Oral Daily Pucilowska, Jolanta B, MD   50 mg at 03/14/18 0742  . magnesium hydroxide (MILK OF MAGNESIA) suspension 30 mL  30 mL Oral Daily PRN Pucilowska, Jolanta B, MD   30 mL at  03/12/18 1735  . Melatonin TABS 10 mg  10 mg Oral QHS Pucilowska, Jolanta B, MD   10 mg at 03/13/18 2113  . metoprolol tartrate (LOPRESSOR) tablet 12.5 mg  12.5 mg Oral BID Pucilowska, Jolanta B, MD   12.5 mg at 03/14/18 0743  . [START ON 04/06/2018] paliperidone (INVEGA SUSTENNA) injection 234 mg  234  mg Intramuscular Q28 days Pucilowska, Jolanta B, MD      . paliperidone (INVEGA) 24 hr tablet 6 mg  6 mg Oral Daily Pucilowska, Jolanta B, MD   6 mg at 03/14/18 0742  . QUEtiapine (SEROQUEL) tablet 300 mg  300 mg Oral QHS Pucilowska, Jolanta B, MD   300 mg at 03/13/18 2113  . senna (SENOKOT) tablet 8.6 mg  1 tablet Oral QHS PRN Pucilowska, Jolanta B, MD   8.6 mg at 03/12/18 0020  . simvastatin (ZOCOR) tablet 20 mg  20 mg Oral QHS Pucilowska, Jolanta B, MD   20 mg at 03/13/18 2113  . sodium polystyrene (KAYEXALATE) 15 GM/60ML suspension 15 g  15 g Oral BID AC & HS Chauncey Mann, MD   15 g at 03/14/18 6606    Lab Results:  Results for orders placed or performed during the hospital encounter of 03/11/18 (from the past 48 hour(s))  Potassium     Status: Abnormal   Collection Time: 03/13/18  8:38 PM  Result Value Ref Range   Potassium 5.5 (H) 3.5 - 5.1 mmol/L    Comment: Performed at [facility], [address] [place], [place] [address]  Basic metabolic panel     Status: Abnormal   Collection Time: 03/13/18  8:38 PM  Result Value Ref Range   Sodium 134 (L) 135 - 145 mmol/L   Potassium 5.5 (H) 3.5 - 5.1 mmol/L   Chloride 95 (L) 98 - 111 mmol/L   CO2 31 22 - 32 mmol/L   Glucose, Bld 123 (H) 70 - 99 mg/dL   BUN 27 (H) 8 - 23 mg/dL   Creatinine, Ser 1.56 (H) 0.44 - 1.00 mg/dL   Calcium 9.5 8.9 - 10.3 mg/dL   GFR calc non Af Amer 33 (L) >60 mL/min   GFR calc Af Amer 39 (L) >60 mL/min    Comment: (NOTE) The eGFR has been calculated using the CKD EPI equation. This calculation has not been validated in all clinical situations. eGFR's persistently <60 mL/min signify possible Chronic Kidney Disease.    Anion gap 8 5 - 15    Comment: Performed at [facility], [address] [place], [place] [address]  Glucose, capillary     Status: Abnormal   Collection Time: 03/13/18  9:12 PM  Result Value Ref Range   Glucose-Capillary 114 (H) 70 - 99 mg/dL   Comment 1 Notify RN    Glucose, capillary     Status: Abnormal   Collection Time: 03/14/18  7:07 AM  Result Value Ref Range   Glucose-Capillary 156 (H) 70 - 99 mg/dL    Blood Alcohol level:  Lab Results  Component Value Date   ETH <10 03/11/2018   ETH <10 35/57/3220    Metabolic Disorder Labs: Lab Results  Component Value Date   HGBA1C 6.6 (H) 02/18/2018   MPG 122.63 11/14/2017   MPG 114 04/14/2012   Lab Results  Component Value Date   PROLACTIN 33.6 (H) 12/04/2015   Lab Results  Component Value Date   CHOL 140 11/14/2017   TRIG 152 (H) 11/14/2017   HDL 50  11/14/2017   CHOLHDL 2.8 11/14/2017   VLDL 30 11/14/2017   LDLCALC 60 11/14/2017   LDLCALC 81 09/30/2017    Physical Findings: AIMS: Facial and Oral Movements Muscles of Facial Expression: None, normal Lips and Perioral Area: None, normal Jaw: None, normal Tongue: None, normal,Extremity Movements Upper (arms, wrists, hands, fingers): None, normal Lower (legs, knees, ankles, toes): None, normal, Trunk Movements Neck, shoulders, hips: None, normal, Overall Severity Severity of abnormal movements (highest score from questions above): None, normal Incapacitation due to abnormal movements: None, normal Patient's awareness of abnormal movements (rate only patient's report): No Awareness, Dental Status Current problems with teeth and/or dentures?: Yes Does patient usually wear dentures?: Yes(upper and bottom dentures)  CIWA:    COWS:  COWS Total Score: 0  Musculoskeletal: Strength & Muscle Tone: within normal limits Gait & Station: normal Patient leans: N/A  Psychiatric Specialty Exam: Physical Exam  Nursing note and vitals reviewed.   Review of Systems  Constitutional: Negative.   HENT: Negative.   Eyes: Negative.   Respiratory: Negative.   Cardiovascular: Negative.   Gastrointestinal: Negative.   Musculoskeletal: Negative.        Left knee pain, chronic  Skin: Negative.   Neurological: Negative.     Blood pressure (!)  149/79, pulse 97, temperature 97.6 F (36.4 C), resp. rate 16, height 5' 3"  (1.6 m), weight 89.8 kg, SpO2 100 %.Body mass index is 35.07 kg/m.  General Appearance: Casual  Eye Contact:  Good  Speech:  Clear and Coherent and Pressured  Volume:  Sometimes loud  Mood:  Not as euphroric  Affect:  Less labile  Thought Process:  Descriptions of Associations: Tangential  Orientation:  Full (Time, Place, and Person)  Thought Content:  Tangential  Suicidal Thoughts:  No  Homicidal Thoughts:  No  Memory:  Immediate;   Fair Recent;   Fair  Judgement:  Fair  Insight:  Fair  Psychomotor Activity:  Normal  Concentration:  Concentration: Fair and Attention Span: Fair  Recall:  [employer] of Knowledge:  Good  Language:  Good  Akathisia:  No  Handed:  Right  AIMS (if indicated):     Assets:  Agricultural consultant Housing  ADL's:  Intact  Cognition:  WNL  Sleep:  Number of Hours: 6     Treatment Plan Summary:  Ms. Mendell is a 66 year old female with a history of bipolar disorder admitted for another manic episode in the context of medication changes.  #Bipolar disorder -Patient is now hypomanic.  Will continue to transition medications from Depakote to Lamictal -continue Invega sustenna injection 234 mg every 28 days, next dose on 9/3 -continue oral Invega 6 mg daily -continue Seroquel 300 mg nightly for mood stabilization, anxiety and depression -continue Depakote taper 500 mg for a week, 250 mg after due to thrombocytopenia -She was started on 61m po daily for mood stabilization she is being titrated down off of Depakote patient made aware of risk of SKelly ServicesSyndrome.  No signs of a rash -Hemoglobin A1c was 6.6 and total cholesterol was 140  #Thrombocytopenia -Patient is being tapered off of Depakote and started on Lamictal  #Insomnia -Continue melatonin 10 mg p.o. nightly   #Hypothyroidism -Continue Synthroid 50 mcg p.o.  daily   #HTN -If blood pressure continues to be elevated, will plan to increase metoprolol -Continue metoprolol 12.5 mg p.o. twice daily and Cozaar 50 mg p.o. daily   #DM -Tradjenta 5 mg daily -Will check BS and place on SSI.  Blood sugars have been controlled -She is on diabetic diet  #Constipation -bowel regimen  #Dislipidemia  -Zocor 20 mg daily  #Knee pain -Voltaren gel  #Disposition -discharge to home with her sister -follow up with [facility]   Daily contact with patient to assess and evaluate symptoms and progress in treatment and Medication management  Chauncey Mann, MD 03/14/2018, 8:30 AM

## 2018-03-14 NOTE — Plan of Care (Signed)
Pt. Verbalizes understanding of prescribed regimen, but could use some reinforcement of education provided. Pt. Compliant with medications. Pt. Interactions with staff and peers appropriate. Pt. Monitored by staff for safety. Pt. Denies SI/HI. Pt. Presents this evening with manic features and flights of ideas periodically.   Problem: Education: Goal: Will be free of psychotic symptoms Outcome: Not Progressing   Problem: Education: Goal: Knowledge of the prescribed therapeutic regimen will improve Outcome: Progressing   Problem: Health Behavior/Discharge Planning: Goal: Compliance with prescribed medication regimen will improve Outcome: Progressing   Problem: Role Relationship: Goal: Ability to interact with others will improve Outcome: Progressing   Problem: Safety: Goal: Ability to remain free from injury will improve Outcome: Progressing

## 2018-03-14 NOTE — Progress Notes (Signed)
D: Pt denies SI/HI/AVH. Pt is pleasant and cooperative during assessments. Pt. Presents frequently with flights of ideas and/or tangential speech. Pt. Reports bowel movement yesterday and denies GI symptoms this evening. Pt. Frequently out of her room around the unit socializing and participating good. Pt. Attends snacks.    A: Q x 15 minute observation checks were completed for safety. Patient was provided with education, but needs reinforcement.  Patient was given/offered medications per orders. Patient  was encourage to attend groups, participate in unit activities and continue with plan of care. Pt. Chart and plans of care reviewed. Pt. Given support and encouragement.   R: Patient is complaint with medication and unit procedures. Blood sugars monitored per orders. Pt. Pain being addressed with provider orders.             Precautionary checks every 15 minutes for safety maintained, room free of safety hazards, patient sustains no injury or falls during this shift. Will endorse care to next shift.

## 2018-03-14 NOTE — Plan of Care (Signed)
Compliant  with unit programing . No concerns around  vital signs   or any other  medical concerns . Compliant  with medication . Information received in concrete form for better understanding . Working on Radiographer, therapeutic . Attending  group therapy for verbalization of feelings  about self  and other concerns  Appetite good  at meals . No anger outburst    Problem: Consults Goal: Concurrent Medical Patient Education Description (See Patient Education Module for education specifics) Outcome: Progressing   Problem: [facility] Concurrent Medical Problem Goal: LTG-Pt will be physically stable and he/significant other Description (Patient will be physically stable and he/significant other will be able to verbalize understanding of follow-up care and symptoms that would warrant further treatment) Outcome: Progressing Goal: STG-Vital signs will be within defined limits or stabilized Description (STG- Vital signs will be within defined limits or stabilized for individual) Outcome: Progressing Goal: STG-Compliance with medication and/or treatment as ordered Description (STG-Compliance with medication and/or treatment as ordered by MD) Outcome: Progressing Goal: STG-Verbalize two symptoms that would warrant further Description (STG-Verbalize two symptoms that would warrant further treatment) Outcome: Progressing Goal: STG-Patient will participate in management/stabilization Description (STG-Patient will participate in management/stabilization of medical condition) Outcome: Progressing Goal: STG-Other (Specify): Description STG-Other Concurrent Medical (Specify): Outcome: Progressing   Problem: Consults Goal: Concurrent Medical Patient Education Description (See Patient Education Module for education specifics) Outcome: Progressing   Problem: [facility] Concurrent Medical Problem Goal: LTG-Pt will be physically stable and he/significant other Description (Patient will be physically stable and  he/significant other will be able to verbalize understanding of follow-up care and symptoms that would warrant further treatment) Outcome: Progressing Goal: STG-Vital signs will be within defined limits or stabilized Description (STG- Vital signs will be within defined limits or stabilized for individual) Outcome: Progressing Goal: STG-Compliance with medication and/or treatment as ordered Description (STG-Compliance with medication and/or treatment as ordered by MD) Outcome: Progressing Goal: STG-Verbalize two symptoms that would warrant further Description (STG-Verbalize two symptoms that would warrant further treatment) Outcome: Progressing Goal: STG-Patient will participate in management/stabilization Description (STG-Patient will participate in management/stabilization of medical condition) Outcome: Progressing Goal: STG-Other (Specify): Description STG-Other Concurrent Medical (Specify): Outcome: Progressing   Problem: Activity: Goal: Will verbalize the importance of balancing activity with adequate rest periods Outcome: Progressing   Problem: Education: Goal: Will be free of psychotic symptoms Outcome: Progressing Goal: Knowledge of the prescribed therapeutic regimen will improve Outcome: Progressing   Problem: Coping: Goal: Coping ability will improve Outcome: Progressing Goal: Will verbalize feelings Outcome: Progressing   Problem: Health Behavior/Discharge Planning: Goal: Compliance with prescribed medication regimen will improve Outcome: Progressing   Problem: Nutritional: Goal: Ability to achieve adequate nutritional intake will improve Outcome: Progressing   Problem: Role Relationship: Goal: Ability to communicate needs accurately will improve Outcome: Progressing Goal: Ability to interact with others will improve Outcome: Progressing   Problem: Safety: Goal: Ability to redirect hostility and anger into socially appropriate behaviors will  improve Outcome: Progressing Goal: Ability to remain free from injury will improve Outcome: Progressing   Problem: Self-Care: Goal: Ability to participate in self-care as condition permits will improve Outcome: Progressing   Problem: Self-Concept: Goal: Will verbalize positive feelings about self Outcome: Progressing   Problem: Education: Goal: Knowledge of General Education information will improve Description Including pain rating scale, medication(s)/side effects and non-pharmacologic comfort measures Outcome: Progressing

## 2018-03-14 NOTE — [facility] Group Notes (Signed)
LCSW Group Therapy Note 03/14/2018 1:15pm  Type of Therapy and Topic: Group Therapy: Feelings Around Returning Home & Establishing a Supportive Framework and Supporting Oneself When Supports Not Available  Participation Level: Active  Description of Group:  Patients first processed thoughts and feelings about upcoming discharge. These included fears of upcoming changes, lack of change, new living environments, judgements and expectations from others and overall stigma of mental health issues. The group then discussed the definition of a supportive framework, what that looks and feels like, and how do to discern it from an unhealthy non-supportive network. The group identified different types of supports as well as what to do when your family/friends are less than helpful or unavailable  Therapeutic Goals  1. Patient will identify one healthy supportive network that they can use at discharge. 2. Patient will identify one factor of a supportive framework and how to tell it from an unhealthy network. 3. Patient able to identify one coping skill to use when they do not have positive supports from others. 4. Patient will demonstrate ability to communicate their needs through discussion and/or role plays.  Summary of Patient Progress:  Patient reported she does not feel good today. Pt engaged during group session. As patients processed their anxiety about discharge and described healthy supports patient shared she is not ready to be discharge.  Patients identified at least one self-care tool they were willing to use after discharge.   Therapeutic Modalities Cognitive Behavioral Therapy Motivational Interviewing   Meli Faley  CUEBAS-COLON, LCSW 03/14/2018 12:00 PM

## 2018-03-14 NOTE — [facility] Group Notes (Signed)
[facility] Group Notes:  (Nursing/MHT/Case Management/Adjunct)  Date:  03/14/2018  Time:  2:44 AM  Type of Therapy:  Group Therapy  Participation Level:  None  Participation Quality:  Attentive  Affect:  Flat  Cognitive:  Alert  Insight:  None  Engagement in Group:  Limited  Modes of Intervention:  Support  Summary of Progress/Problems:  Ivar Drape 03/14/2018, 2:44 AM

## 2018-03-15 ENCOUNTER — Inpatient Hospital Stay
Admission: RE | Admit: 2018-03-15 | Discharge: 2018-03-16 | DRG: 312 | Disposition: A | Discharge status: Other Acute Inpatient Hospital | Payer: Medicare Other | Source: Intra-hospital | Attending: Internal Medicine | Admitting: Internal Medicine

## 2018-03-15 DIAGNOSIS — F3164 Bipolar disorder, current episode mixed, severe, with psychotic features: Secondary | ICD-10-CM | POA: Diagnosis present

## 2018-03-15 DIAGNOSIS — I952 Hypotension due to drugs: Principal | ICD-10-CM | POA: Diagnosis present

## 2018-03-15 DIAGNOSIS — G4733 Obstructive sleep apnea (adult) (pediatric): Secondary | ICD-10-CM | POA: Diagnosis present

## 2018-03-15 DIAGNOSIS — Z79899 Other long term (current) drug therapy: Secondary | ICD-10-CM

## 2018-03-15 DIAGNOSIS — E785 Hyperlipidemia, unspecified: Secondary | ICD-10-CM | POA: Diagnosis present

## 2018-03-15 DIAGNOSIS — Z79890 Hormone replacement therapy (postmenopausal): Secondary | ICD-10-CM

## 2018-03-15 DIAGNOSIS — I959 Hypotension, unspecified: Secondary | ICD-10-CM | POA: Diagnosis present

## 2018-03-15 DIAGNOSIS — E1121 Type 2 diabetes mellitus with diabetic nephropathy: Secondary | ICD-10-CM | POA: Diagnosis present

## 2018-03-15 DIAGNOSIS — Z9989 Dependence on other enabling machines and devices: Secondary | ICD-10-CM

## 2018-03-15 DIAGNOSIS — D631 Anemia in chronic kidney disease: Secondary | ICD-10-CM | POA: Diagnosis present

## 2018-03-15 DIAGNOSIS — Z91041 Radiographic dye allergy status: Secondary | ICD-10-CM

## 2018-03-15 DIAGNOSIS — E1152 Type 2 diabetes mellitus with diabetic peripheral angiopathy with gangrene: Secondary | ICD-10-CM | POA: Diagnosis present

## 2018-03-15 DIAGNOSIS — R4182 Altered mental status, unspecified: Secondary | ICD-10-CM | POA: Diagnosis present

## 2018-03-15 DIAGNOSIS — Z8551 Personal history of malignant neoplasm of bladder: Secondary | ICD-10-CM

## 2018-03-15 DIAGNOSIS — E1122 Type 2 diabetes mellitus with diabetic chronic kidney disease: Secondary | ICD-10-CM | POA: Diagnosis present

## 2018-03-15 DIAGNOSIS — T4275XA Adverse effect of unspecified antiepileptic and sedative-hypnotic drugs, initial encounter: Secondary | ICD-10-CM | POA: Diagnosis present

## 2018-03-15 DIAGNOSIS — E669 Obesity, unspecified: Secondary | ICD-10-CM | POA: Diagnosis present

## 2018-03-15 DIAGNOSIS — Z8542 Personal history of malignant neoplasm of other parts of uterus: Secondary | ICD-10-CM

## 2018-03-15 DIAGNOSIS — I129 Hypertensive chronic kidney disease with stage 1 through stage 4 chronic kidney disease, or unspecified chronic kidney disease: Secondary | ICD-10-CM | POA: Diagnosis present

## 2018-03-15 DIAGNOSIS — E039 Hypothyroidism, unspecified: Secondary | ICD-10-CM | POA: Diagnosis present

## 2018-03-15 DIAGNOSIS — Z888 Allergy status to other drugs, medicaments and biological substances status: Secondary | ICD-10-CM

## 2018-03-15 DIAGNOSIS — N184 Chronic kidney disease, stage 4 (severe): Secondary | ICD-10-CM | POA: Diagnosis present

## 2018-03-15 DIAGNOSIS — Z85038 Personal history of other malignant neoplasm of large intestine: Secondary | ICD-10-CM

## 2018-03-15 DIAGNOSIS — F312 Bipolar disorder, current episode manic severe with psychotic features: Secondary | ICD-10-CM | POA: Diagnosis present

## 2018-03-15 DIAGNOSIS — G92 Toxic encephalopathy: Secondary | ICD-10-CM | POA: Diagnosis present

## 2018-03-15 DIAGNOSIS — Z880 Allergy status to penicillin: Secondary | ICD-10-CM

## 2018-03-15 DIAGNOSIS — Z887 Allergy status to serum and vaccine status: Secondary | ICD-10-CM

## 2018-03-15 DIAGNOSIS — Z6835 Body mass index (BMI) 35.0-35.9, adult: Secondary | ICD-10-CM

## 2018-03-15 LAB — GLUCOSE, CAPILLARY
GLUCOSE-CAPILLARY: 127 mg/dL — AB (ref 70–99)
Glucose-Capillary: 110 mg/dL — ABNORMAL HIGH (ref 70–99)
Glucose-Capillary: 123 mg/dL — ABNORMAL HIGH (ref 70–99)
Glucose-Capillary: 94 mg/dL (ref 70–99)
Glucose-Capillary: 166 mg/dL — ABNORMAL HIGH (ref 70–99)

## 2018-03-15 MED ORDER — ASPIRIN 81 MG PO CHEW
324.0000 mg | CHEWABLE_TABLET | ORAL | Status: AC
  Administered 2018-03-16: 324 mg via ORAL
  Filled 2018-03-15: qty 4

## 2018-03-15 MED ORDER — SODIUM CHLORIDE 0.9 % IV BOLUS
1000.0000 mL | Freq: Once | INTRAVENOUS | Status: AC
  Administered 2018-03-16: 1000 mL via INTRAVENOUS

## 2018-03-15 MED ORDER — SODIUM CHLORIDE 0.9 % IV SOLN
INTRAVENOUS | Status: DC
  Administered 2018-03-16: via INTRAVENOUS

## 2018-03-15 MED ORDER — TEMAZEPAM 15 MG PO CAPS
30.0000 mg | ORAL_CAPSULE | Freq: Every day | ORAL | Status: DC
  Administered 2018-03-15: 30 mg via ORAL
  Filled 2018-03-15: qty 2

## 2018-03-15 MED ORDER — ASPIRIN 300 MG RE SUPP: 300.0000 mg | RECTAL | Status: AC

## 2018-03-15 MED ORDER — SODIUM CHLORIDE 0.9 % IV SOLN: 250.0000 mL | INTRAVENOUS | Status: DC | PRN

## 2018-03-15 MED ORDER — ENOXAPARIN SODIUM 40 MG/0.4ML ~~LOC~~ SOLN
40.0000 mg | SUBCUTANEOUS | Status: DC
  Administered 2018-03-16: 40 mg via SUBCUTANEOUS
  Filled 2018-03-15: qty 0.4

## 2018-03-15 MED ORDER — SODIUM CHLORIDE 0.9 % IV BOLUS
1000.0000 mL | Freq: Once | INTRAVENOUS | Status: AC
  Administered 2018-03-15: 1000 mL via INTRAVENOUS

## 2018-03-15 NOTE — Plan of Care (Signed)
Patient has been stable and compliant with her prescribed medication regimen and all questions/concerns have been addressed and answered at this time. Patient has been present in the milieu and attending unit groups today. Patient denies SI/HI/AVH as well as any anxiety at this time. Patient states that she is depressed because "I kind of miss my sister". Patient has achieved adequate nutrition thus far. Patient has interacted well with staff as well as other members on the unit and has the ability to redirect her anger and frustrations into appropriate behaviors. Patient participated in self-care and remains safe on the unit at this time.  Problem: Consults Goal: Concurrent Medical Patient Education Description (See Patient Education Module for education specifics) Outcome: Progressing   Problem: BHH Concurrent Medical Problem Goal: LTG-Pt will be physically stable and he/significant other Description (Patient will be physically stable and he/significant other will be able to verbalize understanding of follow-up care and symptoms that would warrant further treatment) Outcome: Progressing Goal: STG-Vital signs will be within defined limits or stabilized Description (STG- Vital signs will be within defined limits or stabilized for individual) Outcome: Progressing Goal: STG-Compliance with medication and/or treatment as ordered Description (STG-Compliance with medication and/or treatment as ordered by MD) Outcome: Progressing Goal: STG-Verbalize two symptoms that would warrant further Description (STG-Verbalize two symptoms that would warrant further treatment) Outcome: Progressing Goal: STG-Patient will participate in management/stabilization Description (STG-Patient will participate in management/stabilization of medical condition) Outcome: Progressing Goal: STG-Other (Specify): Description STG-Other Concurrent Medical (Specify): Outcome: Progressing   Problem: Consults Goal:  Concurrent Medical Patient Education Description (See Patient Education Module for education specifics) Outcome: Progressing   Problem: [facility] Concurrent Medical Problem Goal: LTG-Pt will be physically stable and he/significant other Description (Patient will be physically stable and he/significant other will be able to verbalize understanding of follow-up care and symptoms that would warrant further treatment) Outcome: Progressing Goal: STG-Vital signs will be within defined limits or stabilized Description (STG- Vital signs will be within defined limits or stabilized for individual) Outcome: Progressing Goal: STG-Compliance with medication and/or treatment as ordered Description (STG-Compliance with medication and/or treatment as ordered by MD) Outcome: Progressing Goal: STG-Verbalize two symptoms that would warrant further Description (STG-Verbalize two symptoms that would warrant further treatment) Outcome: Progressing Goal: STG-Patient will participate in management/stabilization Description (STG-Patient will participate in management/stabilization of medical condition) Outcome: Progressing Goal: STG-Other (Specify): Description STG-Other Concurrent Medical (Specify): Outcome: Progressing   Problem: Activity: Goal: Will verbalize the importance of balancing activity with adequate rest periods Outcome: Progressing   Problem: Education: Goal: Will be free of psychotic symptoms Outcome: Progressing Goal: Knowledge of the prescribed therapeutic regimen will improve Outcome: Progressing   Problem: Coping: Goal: Coping ability will improve Outcome: Progressing Goal: Will verbalize feelings Outcome: Progressing   Problem: Health Behavior/Discharge Planning: Goal: Compliance with prescribed medication regimen will improve Outcome: Progressing   Problem: Nutritional: Goal: Ability to achieve adequate nutritional intake will improve Outcome: Progressing   Problem: Role  Relationship: Goal: Ability to communicate needs accurately will improve Outcome: Progressing Goal: Ability to interact with others will improve Outcome: Progressing   Problem: Safety: Goal: Ability to redirect hostility and anger into socially appropriate behaviors will improve Outcome: Progressing Goal: Ability to remain free from injury will improve Outcome: Progressing   Problem: Self-Care: Goal: Ability to participate in self-care as condition permits will improve Outcome: Progressing   Problem: Self-Concept: Goal: Will verbalize positive feelings about self Outcome: Progressing   Problem: Education: Goal: Knowledge of General Education information will improve Description  Including pain rating scale, medication(s)/side effects and non-pharmacologic comfort measures Outcome: Progressing

## 2018-03-15 NOTE — Consult Note (Signed)
Name: Gina [name] MRN: [id number] DOB: 1951-06-21    ADMISSION DATE:  03/15/2018 CONSULTATION DATE: 03/15/2018  REFERRING MD : Dr. Jannifer Franklin   CHIEF COMPLAINT: Hypotension   BRIEF PATIENT DESCRIPTION:  67 yo female with hx of Bipolar Disorder admitted to the behavioral health unit 08/9 with manic episodes secondary to medication changes (tapering depakote dose due to thrombocytopenia) transferred to the stepdown unit 08/12 with hypotension after receiving Restoril   SIGNIFICANT EVENTS/STUDIES:  08/8 Pt admitted to behavioral unit with manic episodes  08/12 Discharged from behavioral heath unit and admitted to the stepdown unit with hypotension   HISTORY OF PRESENT ILLNESS:   This is a 67 yo female with a PMH of Uncontrolled Type II DM, Hepatitis C, OSA uses CPAP. HTN, Hyperlipidemia, Uterine Cancer s/p Hysterectomy, Bladder and Colon Cancer, CKD-stage III, Bipolar Disorder, and Anemia of CKD.  She presented to [facility] ER on 08/8 with ACT team member with insomnia, constipation, and "mania" due to recent tapering of depakote.  Per H&P the pt stated her Oncologist suggested tapering off depakote due to thrombocytopenia.  She was subsequently admitted to the behavioral health unit for medication management by psychiatry.  On 08/12 pt received 10 mg melatonin, 300 mg seroquel, and 30 mg restoril.  Following administration of bedtime medications she became hypotensive and lethargic requiring admission to the stepdown unit by hospitalist team for further workup and treatment.   PAST MEDICAL HISTORY :   has a past medical history of Anemia in chronic kidney disease, Bipolar depression ([place]), Bladder cancer ([place]) (1990's), Blood transfusion without reported diagnosis (2009), Cataract, CKD (chronic kidney disease) stage 3, GFR 30-59 ml/min (HCC), Colon cancer ([place]) (1990's), DJD (degenerative joint disease), lumbar, Enterocutaneous fistula (04/07/2012), Family history of breast cancer, Family  history of colon cancer, Family history of stomach cancer, GERD (gastroesophageal reflux disease), History of bladder cancer (1997), History of colon cancer, History of uterine cancer, Hyperlipidemia, Hypertension, IDA (iron deficiency anemia) (01/2013), Obesity (BMI 30-39.9), OSA on CPAP, Personal history of colonic adenomas and colon cancer (11/11/2012), Positive hepatitis C antibody test (09/2014), RBBB, and Uncontrolled type 2 diabetes mellitus with nephropathy ([place]) (06/07/2014).  has a past surgical history that includes Hernia repair (02/04/12); Partial colectomy (about 2008); i & d abdominal wound (02/19/12); Removal of infected mesh and abdominal wound vac placement (02/24/12); Reexploration of abdominal wound and Allograft placemet (02/26/12); Partial hysterectomy (1981); Left oophorectomy (2005); Colonoscopy (07/2009); DEXA (12/2009); Dobutamine stress echo (12/2009); Colonoscopy (11/2012); sleep study (02/2014); Colonoscopy (02/2015); Colonoscopy with propofol (N/A, 06/09/2017); and Breast biopsy (Right, 01/2014). Prior to Admission medications   Medication Sig Start Date End Date Taking? Authorizing Provider  ACCU-CHEK AVIVA PLUS test strip 1 each by Other route daily. E11.21 09/18/17   Ria Bush, MD  bisacodyl (DULCOLAX) 10 MG suppository Place 1 suppository (10 mg total) rectally daily as needed for moderate constipation. 11/23/17   Pucilowska, Herma Ard B, MD  diclofenac sodium (VOLTAREN) 1 % GEL Apply 4 g topically 4 (four) times daily. To knees 01/06/18   Copland, Frederico Hamman, MD  divalproex (DEPAKOTE) 500 MG DR tablet Take 1 tablet (500 mg total) by mouth 2 (two) times daily with a meal. 01/15/16   Ria Bush, MD  docusate sodium (COLACE) 100 MG capsule Take 2 capsules (200 mg total) by mouth 2 (two) times daily. 11/23/17   Pucilowska, Wardell Honour, MD  GAVILAX powder TAKE 17 GRAMS BY MOUTH TWICE DAILY AS NEEDED FOR MODERATE CONSTIPATION. 02/11/18   Ria Bush, MD  lamoTRIgine (LAMICTAL)  25 MG  tablet Take 2 tablets (50 mg total) by mouth 2 (two) times daily. 11/24/17   Pucilowska, Wardell Honour, MD  levothyroxine (SYNTHROID, LEVOTHROID) 50 MCG tablet Take 1 tablet (50 mcg total) by mouth daily before breakfast. 10/21/17   Ria Bush, MD  losartan (COZAAR) 50 MG tablet Take 1 tablet (50 mg total) by mouth daily. 07/03/17   Ria Bush, MD  MELATONIN PO Take 1 tablet by mouth at bedtime.    [provider]  metoprolol tartrate (LOPRESSOR) 25 MG tablet Take 0.5 tablets (12.5 mg total) by mouth 2 (two) times daily. 11/23/17   Pucilowska, Jolanta B, MD  paliperidone (INVEGA SUSTENNA) 234 MG/1.5ML SUSP injection Inject 234 mg into the muscle every 30 (thirty) days.    [provider]  QUEtiapine (SEROQUEL) 300 MG tablet Take 1 tablet (300 mg total) by mouth at bedtime. 03/01/18   Ria Bush, MD  senna (SENOKOT) 8.6 MG TABS tablet Take 1 tablet (8.6 mg total) by mouth at bedtime as needed for mild constipation. 11/09/17   Pucilowska, Jolanta B, MD  simvastatin (ZOCOR) 20 MG tablet Take 1 tablet (20 mg total) by mouth at bedtime. 07/03/17   Ria Bush, MD  sitaGLIPtin (JANUVIA) 25 MG tablet Take 1 tablet (25 mg total) by mouth daily. 02/18/18   Ria Bush, MD  triamcinolone cream (KENALOG) 0.1 % Apply 1 application topically at bedtime. Apply to [place]. 02/18/18 02/18/19  Ria Bush, MD   Allergies  Allergen Reactions  . Risperidone And Related Other (See Comments)    Reaction:  Altered mental status   . Penicillins Other (See Comments)    Pt states that this med knocks her out.  Has patient had a PCN reaction causing immediate rash, facial/tongue/throat swelling, SOB or lightheadedness with hypotension Unsure  Has patient had a PCN reaction causing severe rash involving mucus membranes or skin necrosis Unsure  Has patient had a PCN reaction that required hospitalization Unsure  Has patient had a PCN reaction occurring within the last 10 years Unsure    If all of the above answers are "NO", then may proceed with Cephalosporin use.  Clementeen Hoof [Iodinated Diagnostic Agents] Rash  . Tetanus Toxoids Rash       . Zetia [Ezetimibe] Rash    FAMILY HISTORY:  family history includes Arthritis in her other; Breast cancer in her maternal grandmother; CAD in her father; Colon cancer (age of onset: 56) in her other; Colon cancer (age of onset: 11) in her mother; Diabetes in her brother, brother, and other; Mental illness in her sister; Stomach cancer in her mother; Thyroid disease in her sister. SOCIAL HISTORY:  reports that she has never smoked. She has never used smokeless tobacco. She reports that she does not drink alcohol or use drugs.  REVIEW OF SYSTEMS:   Unable to assess pt lethargic   SUBJECTIVE:  Unable to assess pt lethargic   VITAL SIGNS: Temp:  [96.7 F (35.9 C)-98.2 F (36.8 C)] 96.7 F (35.9 C) (08/12 2307) Pulse Rate:  [64-112] 64 (08/12 2307) Resp:  [12-18] 14 (08/12 2307) BP: (51-134)/(28-83) 51/28 (08/12 2245) SpO2:  [95 %-100 %] 98 % (08/12 2307)  PHYSICAL EXAMINATION: General: acutely ill appearing female, NAD  Neuro: lethargic, following commands, PERRL  HEENT: supple, no JVD  Cardiovascular: nsr, rrr, no R/G Lungs: diminished throughout, even, non labored  Abdomen: hypoactive BS x4, obese, soft, non distended  Musculoskeletal: normal bulk and tone, no edema  Skin: intact no rashes or lesions  Recent Labs  Lab 03/11/18 1439 03/13/18 2038 03/14/18 0724  NA 136 134* 135  K 5.3* 5.5*  5.5* 4.5  CL 104 95* 96*  CO2 22 31 27   BUN 23 27* 29*  CREATININE 1.58* 1.56* 1.73*  GLUCOSE 282* 123* 155*   Recent Labs  Lab 03/11/18 1602  HGB 13.4  HCT 37.6  WBC 7.5  PLT 125*   No results found.  ASSESSMENT / PLAN: Acute encephalopathy secondary to sedating medications  Hypotension likely secondary to sedating medications-resolved Acute on chronic renal failure  Hx: Diabetes Mellitus, Hepatitis C, OSA,  HTN, and Anemia of CKD  P: Supplemental O2 for dyspnea and/or hypoxia  Bipap or CPAP qhs  Continuous telemetry monitoring  Aggressive fluid resuscitation to maintain map >65 NS@ 100 ml/hr  Trend BMP Replace electrolytes as indicated  Monitor UOP VTE px: subq lovenox Trend CBC  Monitor for s/sx of bleeding and transfuse for hgb <7 Psychiatry consulted to manage psychiatric medications appreciate input  Avoid sedating medications for now   Marda Stalker, [place] Pager [phone] (please enter 7 digits) PCCM Consult Pager [phone] (please enter 7 digits)

## 2018-03-15 NOTE — Progress Notes (Signed)
Patient ID: Gina [name], female   DOB: 1951-08-03, 67 y.o.   MRN: [id number] PER STATE REGULATIONS 482.30  THIS CHART WAS REVIEWED FOR MEDICAL NECESSITY WITH RESPECT TO THE PATIENT'S ADMISSION/ DURATION OF STAY.  NEXT REVIEW DATE: 03/19/2018  Chauncy Lean, RN, BSN CASE MANAGER

## 2018-03-15 NOTE — Progress Notes (Signed)
Pt. After receiving scheduled night time medications proceeded to attend snack time in the day room when she became increasingly drowsy from new additional scheduled night time medications added due to patient not sleeping from previous night. This Probation officer and staff recognized patient had become over sedated from prescribed night time medications and was escorted for safety to patients room via wheelchair. Pt. Was helped in bed with head of bed elevated and turned to her side to prevent aspirations, promote airway, and for patient safety. This Probation officer with staff assessed patient vitals and blood sugar as well as reached out to night shift house supervisor and the attending for orders and notification. Orders received from attending to continue to monitor patient and check vitals signs, but then CPAP was ordered, and then patient was eventually recommended by attending and house supervisor to be transferred to ICU for stabilization. Pt. Was continuously monitored for safety with house supervisor and staff and this writer until transferred to ICU for stabilization. This Probation officer gave report to ICU over the phone and had patient discharged and admitted to ICU.

## 2018-03-15 NOTE — Discharge Summary (Addendum)
Physician Discharge Summary Note  Patient:  Gina [name] is an 67 y.o., female MRN:  [id number] DOB:  12-22-1950 Patient phone:  [phone] (home)  Patient address:   [place] [address],  Total Time spent with patient: 20 minutes  Date of Admission:  03/11/2018 Date of Discharge: 03/15/2018  Reason for Admission:  Mania  History of Present Illness:   Identifying data. Gina [name] is a 67 year old female with a history of treatment resistant bipolar disorder.  Chief complaint. "Hi doc."  History of present illness. Information was obtained from the patient and the chart. The patient came to the ER complaining of :mania": She has not slept in several days, prays all the time, talks to much and "can't think". She explains clumsily, that her oncologist suggested tapering off depakote for low platelets. It seems that her Depakote was discontinued abruptly at the beginning.   Past psychiatric history. Long history of bipolar illness with multiple hospitalization for mostly manic episodes. Difficult to treat, Her primary psychiatrist, Gina [name] of [facility] added oral Invega to her injections, and has been cross tapering Depakote for Lamictal. There were no suicide attempts.  Family psychiatric history. Sister with intellectual disability.  Social history. Disabled from mental illness. Lives with her sister.   Principal Problem: Bipolar I disorder, current or most recent episode manic, with psychotic features [facility]) Discharge Diagnoses: Patient Active Problem List   Diagnosis Date Noted  . Bipolar I disorder, current or most recent episode manic, with psychotic features ([place]) [F31.2] 11/04/2017    Priority: High  . Rash of hands [R21] 02/18/2018  . Pain of right heel [M79.671] 12/30/2017  . Encounter for chronic pain management [G89.29] 12/18/2017  . Foot pain, bilateral [id number], M79.672] 11/25/2017  . Gallbladder polyp [K82.4] 10/21/2017   . Left shoulder pain [M25.512] 10/21/2017  . Benign neoplasm of ascending colon [D12.2]   . Low back pain radiating to left lower extremity [M54.5, M79.605] 02/26/2017  . Acquired hypothyroidism [E03.9] 09/10/2016  . Thrombocytopenia ([place]) [D69.6] 06/15/2016  . Medicare annual wellness visit, subsequent [Z00.00] 06/04/2016  . Advanced care planning/counseling discussion [Z71.89] 06/04/2016  . External hemorrhoid [K64.4] 04/02/2016  . Slow transit constipation [K59.01] 01/04/2016  . Bipolar I disorder, most recent episode (or current) manic ([place]) [F31.10] 12/03/2015  . NAFLD (nonalcoholic fatty liver disease) [K76.0] 03/28/2015  . Gallbladder calculus without cholecystitis [K80.20] 03/28/2015  . Gout [M10.9] 03/27/2015  . HCV antibody positive [R76.8] 03/27/2015  . History of bladder cancer [Z85.51] 03/27/2015  . History of bundle branch block [Z86.79] 03/27/2015  . Genetic testing [Z13.79] 02/28/2015  . Loss of memory [R41.3] 09/04/2014  . Controlled type 2 diabetes mellitus with diabetic nephropathy ([place]) [E11.21] 06/07/2014  . Hyperlipidemia [E78.5]   . Anemia in CKD (chronic kidney disease) [N18.9, D63.1] 01/26/2013  . Personal history of colonic adenomas and colon cancer [Z86.010] 11/11/2012  . Osteoarthritis of knee [M17.10] 10/02/2012  . OSA on CPAP [G47.33, Z99.89]   . CKD (chronic kidney disease) stage 4, GFR 15-29 ml/min (HCC) [N18.4]   . Severe obesity (BMI 35.0-39.9) with comorbidity ([place]) [E66.01]   . HTN (hypertension), benign [I10] 04/07/2012  . Hx of colon cancer, stage I [Z85.038] 08/05/2011   Past Medical History:  Past Medical History:  Diagnosis Date  . Anemia in chronic kidney disease   . Bipolar depression [facility])    sees psychiatrist - psych admission 03/2015  . Bladder cancer ([place]) 1990's  . Blood transfusion without reported diagnosis 2009  .  Cataract    left  . CKD (chronic kidney disease) stage 3, GFR 30-59 ml/min (HCC)   . Colon cancer ([place]) 1990's   . DJD (degenerative joint disease), lumbar    chronic lower back pain  . Enterocutaneous fistula 04/07/2012   completed PT 06/2012 ([facility])  . Family history of breast cancer   . Family history of colon cancer   . Family history of stomach cancer   . GERD (gastroesophageal reflux disease)   . History of bladder cancer 1997  . History of colon cancer    s/p surgery  . History of uterine cancer    s/p hysterectomy  . Hyperlipidemia   . Hypertension   . IDA (iron deficiency anemia) 01/2013   thought due to h/o polyps  . Obesity (BMI 30-39.9)   . OSA on CPAP    6cm H2O  . Personal history of colonic adenomas and colon cancer 11/11/2012  . Positive hepatitis C antibody test 09/2014   but negative confirmatory testing  . RBBB   . Uncontrolled type 2 diabetes mellitus with nephropathy ([place]) 06/07/2014   completed DSME 02/2016    Past Surgical History:  Procedure Laterality Date  . BREAST BIOPSY Right 01/2014   fibroadenoma  . COLONOSCOPY  07/2009  . COLONOSCOPY  11/2012   2 tubular adenomas, mild diverticulosis, pending genetic testing for Lynch syndrome [name]) rpt 2 yrs  . COLONOSCOPY  02/2015   TA, diverticulosis, rpt 2 yrs [name])  . COLONOSCOPY WITH PROPOFOL N/A 06/09/2017   TAx1, rpt 2 yrs [name], [name], MD)  . DEXA  12/2009   WNL  . DOBUTAMINE STRESS ECHO  12/2009   no evidence of ischemia  . HERNIA REPAIR  02/04/12  . i & d abdominal wound  02/19/12  . LEFT OOPHORECTOMY  2005  . PARTIAL COLECTOMY  about 2008   for colon cancer  . PARTIAL HYSTERECTOMY  1981   uterine cancer, R ovary remains  . Reexploration of abdominal wound and Allograft placemet  02/26/12  . Removal of infected mesh and abdominal wound vac placement  02/24/12  . sleep study  02/2014   OSA - AHI 55, nadir 81% [name])   Family History:  Family History  Problem Relation Age of Onset  . Colon cancer Mother 6  . Stomach cancer Mother        dx in her 28s?  . CAD Father        MI; deceased 79  .  Mental illness Sister        anxiety/depression  . Thyroid disease Sister   . Breast cancer Maternal Grandmother        age at dx unknown  . Diabetes Brother   . Diabetes Brother   . Diabetes Other        aunts and uncles both sides  . Arthritis Other        strong fmhx  . Colon cancer Other 60       maternal half-sister; deceased    Social History:  Social History   Substance and Sexual Activity  Alcohol Use No  . Alcohol/week: 0.0 standard drinks     Social History   Substance and Sexual Activity  Drug Use No    Social History   Socioeconomic History  . Marital status: Single    Spouse name: Not on file  . Number of children: 0  . Years of education: Not on file  . Highest education level: Not on file  Occupational  History  . Not on file  Social Needs  . Financial resource strain: Not on file  . Food insecurity:    Worry: Not on file    Inability: Not on file  . Transportation needs:    Medical: Not on file    Non-medical: Not on file  Tobacco Use  . Smoking status: Never Smoker  . Smokeless tobacco: Never Used  Substance and Sexual Activity  . Alcohol use: No    Alcohol/week: 0.0 standard drinks  . Drug use: No  . Sexual activity: Never  Lifestyle  . Physical activity:    Days per week: Not on file    Minutes per session: Not on file  . Stress: Not on file  Relationships  . Social connections:    Talks on phone: Not on file    Gets together: Not on file    Attends religious service: Not on file    Active member of club or organization: Not on file    Attends meetings of clubs or organizations: Not on file    Relationship status: Not on file  Other Topics Concern  . Not on file  Social History Narrative   Lives with sister, no pets   Occupation: disabled, for bipolar and arthritis   Edu: GED   Activity: take walks   Diet: good water, vegetables daily   Religion: [place], Dr. Jimmye Norman (ph [phone])     Hospital Course:    Ms. Fajardo is a 67 year old female with a history of bipolar disorder admitted for another manic episode in the context of medication changes. She had severe insomnia and was given 30 mg of Restoril tonight causinh hypotonia. Rapid respnse was called and the patient was transferred to ICU for stabilixzation.  #Bipolar disorder, hypomania -continue Invega sustenna injection 234 mg every 28 days, next dose on 9/3 -continue Seroquel 300 mg nightlyfor mood stabilization, anxiety and depression -continue Depakote taper 500 mg for a week, 250 mg after due to thrombocytopenia -continue Lamictal titration  #Thrombocytopenia -Patient is being tapered off of Depakoteand started on Lamictal  #Insomnia, slept 4 hours -Restoril 30 mg nightly -Continue melatonin 10 mg p.o. nightly  #Hypothyroidism -Continue Synthroid 50 mcg p.o. daily  #HTN -Continue metoprolol 12.5 mg p.o. twice daily and Cozaar 50 mg p.o. daily  #DM -Tradjenta 5 mg daily -SSI, CBG, ADA diet  #Constipation -bowel regimen  #Dislipidemia  -Zocor 20 mg daily  #Knee pain -Voltaren gel  #Labs -Hemoglobin A1c was 6.6 and total cholesterol was 140  #Disposition -patient was transferred to medical floor   Physical Findings: AIMS: Facial and Oral Movements Muscles of Facial Expression: None, normal Lips and Perioral Area: None, normal Jaw: None, normal Tongue: None, normal,Extremity Movements Upper (arms, wrists, hands, fingers): None, normal Lower (legs, knees, ankles, toes): None, normal, Trunk Movements Neck, shoulders, hips: None, normal, Overall Severity Severity of abnormal movements (highest score from questions above): None, normal Incapacitation due to abnormal movements: None, normal Patient's awareness of abnormal movements (rate only patient's report): No Awareness, Dental Status Current problems with teeth and/or dentures?: Yes Does patient usually wear  dentures?: Yes(upper and bottom dentures)  CIWA:    COWS:  COWS Total Score: 0  Musculoskeletal: Strength & Muscle Tone: within normal limits Gait & Station: normal Patient leans: N/A  Psychiatric Specialty Exam: Physical Exam  Nursing note and vitals reviewed. Psychiatric: Her affect is labile. Her speech is rapid and/or pressured.  She is hyperactive. Thought content is delusional. Cognition and memory are normal. She expresses impulsivity.    Review of Systems  Neurological: Positive for headaches.  Psychiatric/Behavioral: The patient has insomnia.   All other systems reviewed and are negative.   Blood pressure 127/83, pulse 89, temperature 98 F (36.7 C), temperature source Oral, resp. rate 12, height 5\' 3"  (1.6 m), weight 89.8 kg, SpO2 100 %.Body mass index is 35.07 kg/m.  General Appearance: Casual  Eye Contact:  Good  Speech:  Clear and Coherent and Pressured  Volume:  Increased  Mood:  Euphoric  Affect:  Congruent  Thought Process:  Goal Directed and Descriptions of Associations: Intact  Orientation:  Full (Time, Place, and Person)  Thought Content:  Delusions  Suicidal Thoughts:  No  Homicidal Thoughts:  No  Memory:  Immediate;   Fair Recent;   Fair Remote;   Fair  Judgement:  Impaired  Insight:  Present  Psychomotor Activity:  Increased  Concentration:  Concentration: Fair and Attention Span: Fair  Recall:  [employer] of Knowledge:  Fair  Language:  Fair  Akathisia:  No  Handed:  Right  AIMS (if indicated):     Assets:  Communication Skills Desire for Improvement Financial Resources/Insurance Housing Resilience Social Support  ADL's:  Intact  Cognition:  WNL  Sleep:  Number of Hours: 4.15     Have you used any form of tobacco in the last 30 days? (Cigarettes, Smokeless Tobacco, Cigars, and/or Pipes): No  Has this patient used any form of tobacco in the last 30 days? (Cigarettes, Smokeless Tobacco, Cigars, and/or Pipes) Yes, No  Blood Alcohol  level:  Lab Results  Component Value Date   ETH <10 03/11/2018   ETH <10 36/14/4315    Metabolic Disorder Labs:  Lab Results  Component Value Date   HGBA1C 6.6 (H) 02/18/2018   MPG 122.63 11/14/2017   MPG 114 04/14/2012   Lab Results  Component Value Date   PROLACTIN 33.6 (H) 12/04/2015   Lab Results  Component Value Date   CHOL 140 11/14/2017   TRIG 152 (H) 11/14/2017   HDL 50 11/14/2017   CHOLHDL 2.8 11/14/2017   VLDL 30 11/14/2017   LDLCALC 60 11/14/2017   LDLCALC 81 09/30/2017    See Psychiatric Specialty Exam and Suicide Risk Assessment completed by Attending Physician prior to discharge.  Discharge destination:  Other:  ICU  Is patient on multiple antipsychotic therapies at discharge:  Yes,   Do you recommend tapering to monotherapy for antipsychotics?  No   Has Patient had three or more failed trials of antipsychotic monotherapy by history:  No  Recommended Plan for Multiple Antipsychotic Therapies: Additional reason(s) for multiple antispychotic treatment:  inadequate response to a single agent  Discharge Instructions    Diet - low sodium heart healthy   Complete by:  As directed    Increase activity slowly   Complete by:  As directed      Allergies as of 03/15/2018      Reactions   Risperidone And Related Other (See Comments)   Reaction:  Altered mental status    Penicillins Other (See Comments)   Pt states that this med knocks her out.  Has patient had a PCN reaction causing immediate rash, facial/tongue/throat swelling, SOB or lightheadedness with hypotension Unsure  Has patient had a PCN reaction causing severe rash involving mucus membranes or skin necrosis Unsure  Has patient had a PCN reaction that required hospitalization Unsure  Has patient  had a PCN reaction occurring within the last 10 years Unsure  If all of the above answers are "NO", then may proceed with Cephalosporin use.   Ivp Dye [iodinated Diagnostic Agents] Rash   Tetanus Toxoids  Rash      Zetia [ezetimibe] Rash      Medication List    STOP taking these medications   clotrimazole 1 % cream Commonly known as:  LOTRIMIN   colchicine 0.6 MG tablet   HYDROcodone-acetaminophen 5-325 MG tablet Commonly known as:  NORCO/VICODIN     TAKE these medications     Indication  ACCU-CHEK AVIVA PLUS test strip Generic drug:  glucose blood 1 each by Other route daily. E11.21  Indication:  diabetes care   bisacodyl 10 MG suppository Commonly known as:  DULCOLAX Place 1 suppository (10 mg total) rectally daily as needed for moderate constipation.  Indication:  Constipation   diclofenac sodium 1 % Gel Commonly known as:  VOLTAREN Apply 4 g topically 4 (four) times daily. To knees  Indication:  Joint Damage causing Pain and Loss of Function   divalproex 500 MG DR tablet Commonly known as:  DEPAKOTE Take 1 tablet (500 mg total) by mouth 2 (two) times daily with a meal.  Indication:  Manic Phase of Manic-Depression   docusate sodium 100 MG capsule Commonly known as:  COLACE Take 2 capsules (200 mg total) by mouth 2 (two) times daily.  Indication:  Constipation   GAVILAX powder Generic drug:  polyethylene glycol powder TAKE 17 GRAMS BY MOUTH TWICE DAILY AS NEEDED FOR MODERATE CONSTIPATION.  Indication:  Constipation   INVEGA SUSTENNA 234 MG/1.5ML Susp injection Generic drug:  paliperidone Inject 234 mg into the muscle every 30 (thirty) days.  Indication:  Schizophrenia   lamoTRIgine 25 MG tablet Commonly known as:  LAMICTAL Take 2 tablets (50 mg total) by mouth 2 (two) times daily.  Indication:  Manic-Depression   levothyroxine 50 MCG tablet Commonly known as:  SYNTHROID, LEVOTHROID Take 1 tablet (50 mcg total) by mouth daily before breakfast.  Indication:  Underactive Thyroid   losartan 50 MG tablet Commonly known as:  COZAAR Take 1 tablet (50 mg total) by mouth daily.  Indication:  High Blood Pressure Disorder   MELATONIN PO Take 1 tablet by  mouth at bedtime.  Indication:  insomnia   metoprolol tartrate 25 MG tablet Commonly known as:  LOPRESSOR Take 0.5 tablets (12.5 mg total) by mouth 2 (two) times daily.  Indication:  High Blood Pressure Disorder   QUEtiapine 300 MG tablet Commonly known as:  SEROQUEL Take 1 tablet (300 mg total) by mouth at bedtime.  Indication:  Manic Phase of Manic-Depression   senna 8.6 MG Tabs tablet Commonly known as:  SENOKOT Take 1 tablet (8.6 mg total) by mouth at bedtime as needed for mild constipation.  Indication:  Constipation   simvastatin 20 MG tablet Commonly known as:  ZOCOR Take 1 tablet (20 mg total) by mouth at bedtime.  Indication:  High Amount of Fats in the Blood   sitaGLIPtin 25 MG tablet Commonly known as:  JANUVIA Take 1 tablet (25 mg total) by mouth daily.  Indication:  Type 2 Diabetes   triamcinolone cream 0.1 % Commonly known as:  KENALOG Apply 1 application topically at bedtime. Apply to AA.  Indication:  Skin Inflammation due to Irritant        Follow-up recommendations:  Activity:  as tolerated Diet:  low sodium heart healthy ADA diet Other:  keep follow up  appointments  Comments:    Signed: Orson Slick, MD 03/15/2018, 10:36 PM

## 2018-03-15 NOTE — Plan of Care (Signed)
Pt. Is complaint with medications. Pt. Denies SI/Hi and is able to verbally contract for safety.    Problem: Health Behavior/Discharge Planning: Goal: Compliance with prescribed medication regimen will improve Outcome: Progressing   Problem: Safety: Goal: Ability to remain free from injury will improve Outcome: Progressing

## 2018-03-15 NOTE — Progress Notes (Signed)
[facility] MD Progress Note  03/15/2018 8:35 PM Gina [name]  MRN:  [id number]  Subjective:    Gina [name] still heper verbal, hyperactive, intrusive, tangential and goofy. She is also hyper religious trying to pray her symptoms of with rosary. She is still disorganized in her thinking. She slept 3 hours only and complains of headache. It is impossible to interrupt her talk.  Principal Problem: Bipolar I disorder, current or most recent episode manic, with psychotic features ([place]) Diagnosis:   Patient Active Problem List   Diagnosis Date Noted  . Bipolar I disorder, current or most recent episode manic, with psychotic features ([place]) [F31.2] 11/04/2017    Priority: High  . Rash of hands [R21] 02/18/2018  . Pain of right heel [M79.671] 12/30/2017  . Encounter for chronic pain management [G89.29] 12/18/2017  . Foot pain, bilateral [id number], M79.672] 11/25/2017  . Gallbladder polyp [K82.4] 10/21/2017  . Left shoulder pain [M25.512] 10/21/2017  . Benign neoplasm of ascending colon [D12.2]   . Low back pain radiating to left lower extremity [M54.5, M79.605] 02/26/2017  . Acquired hypothyroidism [E03.9] 09/10/2016  . Thrombocytopenia ([place]) [D69.6] 06/15/2016  . Medicare annual wellness visit, subsequent [Z00.00] 06/04/2016  . Advanced care planning/counseling discussion [Z71.89] 06/04/2016  . External hemorrhoid [K64.4] 04/02/2016  . Slow transit constipation [K59.01] 01/04/2016  . Bipolar I disorder, most recent episode (or current) manic ([place]) [F31.10] 12/03/2015  . NAFLD (nonalcoholic fatty liver disease) [K76.0] 03/28/2015  . Gallbladder calculus without cholecystitis [K80.20] 03/28/2015  . Gout [M10.9] 03/27/2015  . HCV antibody positive [R76.8] 03/27/2015  . History of bladder cancer [Z85.51] 03/27/2015  . History of bundle branch block [Z86.79] 03/27/2015  . Genetic testing [Z13.79] 02/28/2015  . Loss of memory [R41.3] 09/04/2014  . Controlled type 2 diabetes mellitus with  diabetic nephropathy ([place]) [E11.21] 06/07/2014  . Hyperlipidemia [E78.5]   . Anemia in CKD (chronic kidney disease) [N18.9, D63.1] 01/26/2013  . Personal history of colonic adenomas and colon cancer [Z86.010] 11/11/2012  . Osteoarthritis of knee [M17.10] 10/02/2012  . OSA on CPAP [G47.33, Z99.89]   . CKD (chronic kidney disease) stage 4, GFR 15-29 ml/min (HCC) [N18.4]   . Severe obesity (BMI 35.0-39.9) with comorbidity ([place]) [E66.01]   . HTN (hypertension), benign [I10] 04/07/2012  . Hx of colon cancer, stage I [Z85.038] 08/05/2011   Total Time spent with patient: 20 minutes  Past Psychiatric History: bipolar disorder.  Past Medical History:  Past Medical History:  Diagnosis Date  . Anemia in chronic kidney disease   . Bipolar depression [facility])    sees psychiatrist - psych admission 03/2015  . Bladder cancer ([place]) 1990's  . Blood transfusion without reported diagnosis 2009  . Cataract    left  . CKD (chronic kidney disease) stage 3, GFR 30-59 ml/min (HCC)   . Colon cancer ([place]) 1990's  . DJD (degenerative joint disease), lumbar    chronic lower back pain  . Enterocutaneous fistula 04/07/2012   completed PT 06/2012 ([facility])  . Family history of breast cancer   . Family history of colon cancer   . Family history of stomach cancer   . GERD (gastroesophageal reflux disease)   . History of bladder cancer 1997  . History of colon cancer    s/p surgery  . History of uterine cancer    s/p hysterectomy  . Hyperlipidemia   . Hypertension   . IDA (iron deficiency anemia) 01/2013   thought due to h/o polyps  . Obesity (BMI 30-39.9)   .  OSA on CPAP    6cm H2O  . Personal history of colonic adenomas and colon cancer 11/11/2012  . Positive hepatitis C antibody test 09/2014   but negative confirmatory testing  . RBBB   . Uncontrolled type 2 diabetes mellitus with nephropathy ([place]) 06/07/2014   completed DSME 02/2016    Past Surgical History:  Procedure Laterality Date  . BREAST  BIOPSY Right 01/2014   fibroadenoma  . COLONOSCOPY  07/2009  . COLONOSCOPY  11/2012   2 tubular adenomas, mild diverticulosis, pending genetic testing for Lynch syndrome [name]) rpt 2 yrs  . COLONOSCOPY  02/2015   TA, diverticulosis, rpt 2 yrs [name])  . COLONOSCOPY WITH PROPOFOL N/A 06/09/2017   TAx1, rpt 2 yrs [name], [name], MD)  . DEXA  12/2009   WNL  . DOBUTAMINE STRESS ECHO  12/2009   no evidence of ischemia  . HERNIA REPAIR  02/04/12  . i & d abdominal wound  02/19/12  . LEFT OOPHORECTOMY  2005  . PARTIAL COLECTOMY  about 2008   for colon cancer  . PARTIAL HYSTERECTOMY  1981   uterine cancer, R ovary remains  . Reexploration of abdominal wound and Allograft placemet  02/26/12  . Removal of infected mesh and abdominal wound vac placement  02/24/12  . sleep study  02/2014   OSA - AHI 55, nadir 81% [name])   Family History:  Family History  Problem Relation Age of Onset  . Colon cancer Mother 55  . Stomach cancer Mother        dx in her 80s?  . CAD Father        MI; deceased 71  . Mental illness Sister        anxiety/depression  . Thyroid disease Sister   . Breast cancer Maternal Grandmother        age at dx unknown  . Diabetes Brother   . Diabetes Brother   . Diabetes Other        aunts and uncles both sides  . Arthritis Other        strong fmhx  . Colon cancer Other 36       maternal half-sister; deceased   Family Psychiatric  History: sister with intellectual disability Social History:  Social History   Substance and Sexual Activity  Alcohol Use No  . Alcohol/week: 0.0 standard drinks     Social History   Substance and Sexual Activity  Drug Use No    Social History   Socioeconomic History  . Marital status: Single    Spouse name: Not on file  . Number of children: 0  . Years of education: Not on file  . Highest education level: Not on file  Occupational History  . Not on file  Social Needs  . Financial resource strain: Not on file  . Food  insecurity:    Worry: Not on file    Inability: Not on file  . Transportation needs:    Medical: Not on file    Non-medical: Not on file  Tobacco Use  . Smoking status: Never Smoker  . Smokeless tobacco: Never Used  Substance and Sexual Activity  . Alcohol use: No    Alcohol/week: 0.0 standard drinks  . Drug use: No  . Sexual activity: Never  Lifestyle  . Physical activity:    Days per week: Not on file    Minutes per session: Not on file  . Stress: Not on file  Relationships  . Social connections:  Talks on phone: Not on file    Gets together: Not on file    Attends religious service: Not on file    Active member of club or organization: Not on file    Attends meetings of clubs or organizations: Not on file    Relationship status: Not on file  Other Topics Concern  . Not on file  Social History Narrative   Lives with sister, no pets   Occupation: disabled, for bipolar and arthritis   Edu: GED   Activity: take walks   Diet: good water, vegetables daily   Religion: [place], Dr. Jimmye Norman (ph [phone])   Additional Social History:                         Sleep: Poor  Appetite:  Fair  Current Medications: Current Facility-Administered Medications  Medication Dose Route Frequency Provider Last Rate Last Dose  . acetaminophen (TYLENOL) tablet 650 mg  650 mg Oral Q6H PRN Pucilowska, Jolanta B, MD   650 mg at 03/15/18 1554  . alum & mag hydroxide-simeth (MAALOX/MYLANTA) 200-200-20 MG/5ML suspension 30 mL  30 mL Oral Q4H PRN Pucilowska, Jolanta B, MD      . bisacodyl (DULCOLAX) suppository 10 mg  10 mg Rectal Daily PRN Pucilowska, Jolanta B, MD      . diclofenac sodium (VOLTAREN) 1 % transdermal gel 4 g  4 g Topical QID Pucilowska, Jolanta B, MD   4 g at 03/15/18 1605  . divalproex (DEPAKOTE) DR tablet 250 mg  250 mg Oral BID WC Pucilowska, Jolanta B, MD   250 mg at 03/15/18 1604  . docusate sodium (COLACE) capsule 200  mg  200 mg Oral BID Pucilowska, Jolanta B, MD   200 mg at 03/15/18 1604  . insulin aspart (novoLOG) injection 0-9 Units  0-9 Units Subcutaneous TID WC Chauncey Mann, MD   1 Units at 03/15/18 1604  . lamoTRIgine (LAMICTAL) tablet 25 mg  25 mg Oral Daily Chauncey Mann, MD   25 mg at 03/15/18 0800  . levothyroxine (SYNTHROID, LEVOTHROID) tablet 50 mcg  50 mcg Oral QAC breakfast Pucilowska, Jolanta B, MD   50 mcg at 03/15/18 0800  . linagliptin (TRADJENTA) tablet 5 mg  5 mg Oral Daily Pucilowska, Jolanta B, MD   5 mg at 03/15/18 0800  . losartan (COZAAR) tablet 50 mg  50 mg Oral Daily Pucilowska, Jolanta B, MD   50 mg at 03/15/18 0800  . magnesium hydroxide (MILK OF MAGNESIA) suspension 30 mL  30 mL Oral Daily PRN Pucilowska, Jolanta B, MD   30 mL at 03/12/18 1735  . Melatonin TABS 10 mg  10 mg Oral QHS Pucilowska, Jolanta B, MD   10 mg at 03/14/18 2125  . metoprolol tartrate (LOPRESSOR) tablet 12.5 mg  12.5 mg Oral BID Pucilowska, Jolanta B, MD   12.5 mg at 03/15/18 1604  . [START ON 04/06/2018] paliperidone (INVEGA SUSTENNA) injection 234 mg  234 mg Intramuscular Q28 days Pucilowska, Jolanta B, MD      . paliperidone (INVEGA) 24 hr tablet 6 mg  6 mg Oral Daily Pucilowska, Jolanta B, MD   6 mg at 03/15/18 0800  . QUEtiapine (SEROQUEL) tablet 300 mg  300 mg Oral QHS Pucilowska, Jolanta B, MD   300 mg at 03/14/18 2125  . senna (SENOKOT) tablet 8.6 mg  1 tablet Oral QHS PRN Pucilowska, Wardell Honour, MD  8.6 mg at 03/12/18 0020  . simvastatin (ZOCOR) tablet 20 mg  20 mg Oral QHS Pucilowska, Jolanta B, MD   20 mg at 03/14/18 2125  . temazepam (RESTORIL) capsule 30 mg  30 mg Oral QHS Pucilowska, Jolanta B, MD        Lab Results:  Results for orders placed or performed during the hospital encounter of 03/11/18 (from the past 48 hour(s))  Potassium     Status: Abnormal   Collection Time: 03/13/18  8:38 PM  Result Value Ref Range   Potassium 5.5 (H) 3.5 - 5.1 mmol/L    Comment: Performed at [facility], [address] [place], [place] [address]  Basic metabolic panel     Status: Abnormal   Collection Time: 03/13/18  8:38 PM  Result Value Ref Range   Sodium 134 (L) 135 - 145 mmol/L   Potassium 5.5 (H) 3.5 - 5.1 mmol/L   Chloride 95 (L) 98 - 111 mmol/L   CO2 31 22 - 32 mmol/L   Glucose, Bld 123 (H) 70 - 99 mg/dL   BUN 27 (H) 8 - 23 mg/dL   Creatinine, Ser 1.56 (H) 0.44 - 1.00 mg/dL   Calcium 9.5 8.9 - 10.3 mg/dL   GFR calc non Af Amer 33 (L) >60 mL/min   GFR calc Af Amer 39 (L) >60 mL/min    Comment: (NOTE) The eGFR has been calculated using the CKD EPI equation. This calculation has not been validated in all clinical situations. eGFR's persistently <60 mL/min signify possible Chronic Kidney Disease.    Anion gap 8 5 - 15    Comment: Performed at [facility], [address] [place], [place] [address]  Glucose, capillary     Status: Abnormal   Collection Time: 03/13/18  9:12 PM  Result Value Ref Range   Glucose-Capillary 114 (H) 70 - 99 mg/dL   Comment 1 Notify RN   Glucose, capillary     Status: Abnormal   Collection Time: 03/14/18  7:07 AM  Result Value Ref Range   Glucose-Capillary 156 (H) 70 - 99 mg/dL  Basic metabolic panel     Status: Abnormal   Collection Time: 03/14/18  7:24 AM  Result Value Ref Range   Sodium 135 135 - 145 mmol/L   Potassium 4.5 3.5 - 5.1 mmol/L   Chloride 96 (L) 98 - 111 mmol/L   CO2 27 22 - 32 mmol/L   Glucose, Bld 155 (H) 70 - 99 mg/dL   BUN 29 (H) 8 - 23 mg/dL   Creatinine, Ser 1.73 (H) 0.44 - 1.00 mg/dL   Calcium 8.9 8.9 - 10.3 mg/dL   GFR calc non Af Amer 29 (L) >60 mL/min   GFR calc Af Amer 34 (L) >60 mL/min    Comment: (NOTE) The eGFR has been calculated using the CKD EPI equation. This calculation has not been validated in all clinical situations. eGFR's persistently <60 mL/min signify possible Chronic Kidney Disease.    Anion gap 12 5 - 15    Comment: Performed at [facility], [address] [place], [place] [address]  Glucose, capillary     Status: Abnormal   Collection Time: 03/14/18 11:14 AM  Result Value Ref Range   Glucose-Capillary 109 (H) 70 - 99 mg/dL  Glucose, capillary     Status: Abnormal   Collection Time: 03/14/18  4:08 PM  Result Value Ref Range   Glucose-Capillary 129 (H) 70 - 99 mg/dL   Comment  1 Document in Chart   Glucose, capillary     Status: Abnormal   Collection Time: 03/14/18  9:13 PM  Result Value Ref Range   Glucose-Capillary 175 (H) 70 - 99 mg/dL   Comment 1 Notify RN   Glucose, capillary     Status: Abnormal   Collection Time: 03/15/18  7:00 AM  Result Value Ref Range   Glucose-Capillary 127 (H) 70 - 99 mg/dL   Comment 1 Notify RN   Glucose, capillary     Status: Abnormal   Collection Time: 03/15/18 12:21 PM  Result Value Ref Range   Glucose-Capillary 110 (H) 70 - 99 mg/dL  Glucose, capillary     Status: Abnormal   Collection Time: 03/15/18  3:59 PM  Result Value Ref Range   Glucose-Capillary 123 (H) 70 - 99 mg/dL    Blood Alcohol level:  Lab Results  Component Value Date   ETH <10 03/11/2018   ETH <10 96/22/2979    Metabolic Disorder Labs: Lab Results  Component Value Date   HGBA1C 6.6 (H) 02/18/2018   MPG 122.63 11/14/2017   MPG 114 04/14/2012   Lab Results  Component Value Date   PROLACTIN 33.6 (H) 12/04/2015   Lab Results  Component Value Date   CHOL 140 11/14/2017   TRIG 152 (H) 11/14/2017   HDL 50 11/14/2017   CHOLHDL 2.8 11/14/2017   VLDL 30 11/14/2017   LDLCALC 60 11/14/2017   LDLCALC 81 09/30/2017    Physical Findings: AIMS: Facial and Oral Movements Muscles of Facial Expression: None, normal Lips and Perioral Area: None, normal Jaw: None, normal Tongue: None, normal,Extremity Movements Upper (arms, wrists, hands, fingers): None, normal Lower (legs, knees, ankles, toes): None, normal, Trunk Movements Neck, shoulders, hips: None, normal, Overall Severity Severity of abnormal movements (highest score  from questions above): None, normal Incapacitation due to abnormal movements: None, normal Patient's awareness of abnormal movements (rate only patient's report): No Awareness, Dental Status Current problems with teeth and/or dentures?: Yes Does patient usually wear dentures?: Yes(upper and bottom dentures)  CIWA:    COWS:  COWS Total Score: 0  Musculoskeletal: Strength & Muscle Tone: within normal limits Gait & Station: normal Patient leans: N/A  Psychiatric Specialty Exam: Physical Exam  Nursing note and vitals reviewed. Psychiatric: Her affect is labile. Her speech is rapid and/or pressured. She is hyperactive. Thought content is delusional. Cognition and memory are normal. She expresses impulsivity.    Review of Systems  Neurological: Positive for headaches.  Psychiatric/Behavioral: The patient has insomnia.   All other systems reviewed and are negative.   Blood pressure 127/83, pulse 89, temperature 98 F (36.7 C), temperature source Oral, resp. rate 12, height 5' 3"  (1.6 m), weight 89.8 kg, SpO2 100 %.Body mass index is 35.07 kg/m.  General Appearance: Casual  Eye Contact:  Good  Speech:  Pressured  Volume:  Increased  Mood:  Euphoric  Affect:  Congruent  Thought Process:  Goal Directed and Descriptions of Associations: Intact  Orientation:  Full (Time, Place, and Person)  Thought Content:  Delusions and Paranoid Ideation  Suicidal Thoughts:  No  Homicidal Thoughts:  No  Memory:  Immediate;   Fair Recent;   Fair Remote;   Fair  Judgement:  Poor  Insight:  Lacking  Psychomotor Activity:  Increased  Concentration:  Concentration: Fair and Attention Span: Fair  Recall:  [employer] of Knowledge:  Fair  Language:  Fair  Akathisia:  No  Handed:  Right  AIMS (if  indicated):     Assets:  Communication Skills Desire for Improvement Financial Resources/Insurance Housing Physical Health Resilience Social Support  ADL's:  Intact  Cognition:  WNL  Sleep:  Number of  Hours: 4.15     Treatment Plan Summary: Daily contact with patient to assess and evaluate symptoms and progress in treatment and Medication management   Ms. Demers is a 67 year old female with a history of bipolar disorder admitted for another manic episode in the context of medication changes.  #Bipolar disorder -Patient is now hypomanic.  Will continue to transition medications from Depakote to Lamictal -continue Invega sustenna injection 234 mg every 28 days, next dose on 9/3 -continue oral Invega 6 mg daily -continue Seroquel 300 mg nightly for mood stabilization, anxiety and depression -continue Depakote taper 500 mg for a week, 250 mg after due to thrombocytopenia -She was started on 5m po daily of Lamictal for mood stabilization she is being titrated down off of Depakote patient made aware of risk of SK[facility] ServicesSyndrome.  No signs of a rash  #Thrombocytopenia -Patient is being tapered off of Depakote and started on Lamictal  #Insomnia, slept 3 hours -Restoril 30 mg nightly -Continue melatonin 10 mg p.o. nightly  #Hypothyroidism -Continue Synthroid 50 mcg p.o. daily  #HTN -If blood pressure continues to be elevated, will plan to increase metoprolol -Continue metoprolol 12.5 mg p.o. twice daily and Cozaar 50 mg p.o. daily  #DM -Tradjenta 5 mg daily -Will check BS and place on SSI.  Blood sugars have been controlled -She is on diabetic diet  #Constipation -bowel regimen  #Dislipidemia  -Zocor 20 mg daily  #Knee pain -Voltaren gel  #Labs -Hemoglobin A1c was 6.6 and total cholesterol was 140 -EKG  #Disposition -discharge to home with her sister -follow up with [facility] ACT team   JOrson Slick MD 03/15/2018, 8:35 PM

## 2018-03-15 NOTE — [facility] Group Notes (Signed)
03/15/2018 1PM  Type of Therapy and Topic:  Group Therapy:  Overcoming Obstacles  Participation Level:  None    Description of Group:    In this group patients will be encouraged to explore what they see as obstacles to their own wellness and recovery. They will be guided to discuss their thoughts, feelings, and behaviors related to these obstacles. The group will process together ways to cope with barriers, with attention given to specific choices patients can make. Each patient will be challenged to identify changes they are motivated to make in order to overcome their obstacles. This group will be process-oriented, with patients participating in exploration of their own experiences as well as giving and receiving support and challenge from other group members.   Therapeutic Goals: 1. Patient will identify personal and current obstacles as they relate to admission. 2. Patient will identify barriers that currently interfere with their wellness or overcoming obstacles.  3. Patient will identify feelings, thought process and behaviors related to these barriers. 4. Patient will identify two changes they are willing to make to overcome these obstacles:      Summary of Patient Progress Patient stayed the entire time. She did not verbally participate. She was laughing to herself the entire time. When asked what she was laughing at, patient responded "I'm laughing at [facility]". Patient is still in the process of meeting her goals.     Therapeutic Modalities:   Cognitive Behavioral Therapy Solution Focused Therapy Motivational Interviewing Relapse Prevention Therapy    Darin Engels MSW, [facility] 03/15/2018 2:34 PM

## 2018-03-15 NOTE — Progress Notes (Signed)
D- Patient alert and oriented. Patient presents in a pleasant mood on assessment stating that she slept "better" last night and had no major complaints to voice to this writer at this time. Patient states that she is depressed because "I kind of miss my sister". Patient denies SI, HI, AVH, at this time. Patient also denies any signs/symptoms of anxiety. Patient's goal for today is "to pray".  A- Scheduled medications administered to patient, per MD orders. Support and encouragement provided.  Routine safety checks conducted every 15 minutes.  Patient informed to notify staff with problems or concerns.  R- No adverse drug reactions noted. Patient contracts for safety at this time. Patient compliant with medications and treatment plan. Patient receptive, calm, and cooperative. Patient interacts well with others on the unit.  Patient remains safe at this time.

## 2018-03-15 NOTE — Progress Notes (Signed)
Recreation Therapy Notes  Date: 03/15/2018  Time: 9:30 am  Location: Craft Room  Behavioral response: Appropriate  Intervention Topic: Problem Solving  Discussion/Intervention:  Group content on today was focused on problem solving. The group described what problem solving is. Patients expressed how problems affect them and how they deal with problems. Individuals identified healthy ways to deal with problems. Patients explained what normally happens to them when they do not deal with problems. The group expressed reoccurring problems for them. The group participated in the intervention "Ways to Solve problems" where patients were given a chance to explore different ways to solve problems.  Clinical Observations/Feedback:  Patient came to group was laughing hysterically the entire time. Kameela Leipold LRT/CTRS         Daniah Zaldivar 03/15/2018 12:07 PM

## 2018-03-15 NOTE — H&P (Signed)
[facility] NAME: Gina [name]    MR#:  [id number]  DATE OF BIRTH:  Mar 15, 1951  DATE OF ADMISSION:  03/15/2018  PRIMARY CARE PHYSICIAN: Ria Bush, MD   REQUESTING/REFERRING PHYSICIAN: Bary Leriche, MD  CHIEF COMPLAINT:  No chief complaint on file.   HISTORY OF PRESENT ILLNESS:  Gina [name]  is a 67 y.o. female who presents with chief complaint as above.  Patient has been in behavioral health unit for several days being treated for primary psychiatric conditions.  Tonight she was started on Restoril at bedtime in addition to her other psych meds.  She had been on Depakote and Seroquel and Lamictal.  Her Depakote dose had recently been reduced.  However, after taking the Restoril tonight in addition to her Seroquel she became hypertonic and Athar Jake and significantly hypotensive.  She was transferred to stepdown unit and hospitalist were called for admission and treatment  PAST MEDICAL HISTORY:   Past Medical History:  Diagnosis Date  . Anemia in chronic kidney disease   . Bipolar depression [facility])    sees psychiatrist - psych admission 03/2015  . Bladder cancer ([place]) 1990's  . Blood transfusion without reported diagnosis 2009  . Cataract    left  . CKD (chronic kidney disease) stage 3, GFR 30-59 ml/min (HCC)   . Colon cancer ([place]) 1990's  . DJD (degenerative joint disease), lumbar    chronic lower back pain  . Enterocutaneous fistula 04/07/2012   completed PT 06/2012 ([facility])  . Family history of breast cancer   . Family history of colon cancer   . Family history of stomach cancer   . GERD (gastroesophageal reflux disease)   . History of bladder cancer 1997  . History of colon cancer    s/p surgery  . History of uterine cancer    s/p hysterectomy  . Hyperlipidemia   . Hypertension   . IDA (iron deficiency anemia) 01/2013   thought due to h/o polyps  . Obesity (BMI 30-39.9)   . OSA on CPAP     6cm H2O  . Personal history of colonic adenomas and colon cancer 11/11/2012  . Positive hepatitis C antibody test 09/2014   but negative confirmatory testing  . RBBB   . Uncontrolled type 2 diabetes mellitus with nephropathy ([place]) 06/07/2014   completed DSME 02/2016     PAST SURGICAL HISTORY:   Past Surgical History:  Procedure Laterality Date  . BREAST BIOPSY Right 01/2014   fibroadenoma  . COLONOSCOPY  07/2009  . COLONOSCOPY  11/2012   2 tubular adenomas, mild diverticulosis, pending genetic testing for Lynch syndrome [name]) rpt 2 yrs  . COLONOSCOPY  02/2015   TA, diverticulosis, rpt 2 yrs [name])  . COLONOSCOPY WITH PROPOFOL N/A 06/09/2017   TAx1, rpt 2 yrs [name], [name], MD)  . DEXA  12/2009   WNL  . DOBUTAMINE STRESS ECHO  12/2009   no evidence of ischemia  . HERNIA REPAIR  02/04/12  . i & d abdominal wound  02/19/12  . LEFT OOPHORECTOMY  2005  . PARTIAL COLECTOMY  about 2008   for colon cancer  . PARTIAL HYSTERECTOMY  1981   uterine cancer, R ovary remains  . Reexploration of abdominal wound and Allograft placemet  02/26/12  . Removal of infected mesh and abdominal wound vac placement  02/24/12  . sleep study  02/2014   OSA - AHI 55, nadir 81% [name])     SOCIAL HISTORY:  Social History   Tobacco Use  . Smoking status: Never Smoker  . Smokeless tobacco: Never Used  Substance Use Topics  . Alcohol use: No    Alcohol/week: 0.0 standard drinks     FAMILY HISTORY:   Family History  Problem Relation Age of Onset  . Colon cancer Mother 41  . Stomach cancer Mother        dx in her 21s?  . CAD Father        MI; deceased 75  . Mental illness Sister        anxiety/depression  . Thyroid disease Sister   . Breast cancer Maternal Grandmother        age at dx unknown  . Diabetes Brother   . Diabetes Brother   . Diabetes Other        aunts and uncles both sides  . Arthritis Other        strong fmhx  . Colon cancer Other 78       maternal half-sister;  deceased     DRUG ALLERGIES:   Allergies  Allergen Reactions  . Risperidone And Related Other (See Comments)    Reaction:  Altered mental status   . Penicillins Other (See Comments)    Pt states that this med knocks her out.  Has patient had a PCN reaction causing immediate rash, facial/tongue/throat swelling, SOB or lightheadedness with hypotension Unsure  Has patient had a PCN reaction causing severe rash involving mucus membranes or skin necrosis Unsure  Has patient had a PCN reaction that required hospitalization Unsure  Has patient had a PCN reaction occurring within the last 10 years Unsure  If all of the above answers are "NO", then may proceed with Cephalosporin use.  Clementeen Hoof [Iodinated Diagnostic Agents] Rash  . Tetanus Toxoids Rash       . Zetia [Ezetimibe] Rash    MEDICATIONS AT HOME:   Prior to Admission medications   Medication Sig Start Date End Date Taking? Authorizing Provider  ACCU-CHEK AVIVA PLUS test strip 1 each by Other route daily. E11.21 09/18/17   Ria Bush, MD  bisacodyl (DULCOLAX) 10 MG suppository Place 1 suppository (10 mg total) rectally daily as needed for moderate constipation. 11/23/17   Pucilowska, Herma Ard B, MD  diclofenac sodium (VOLTAREN) 1 % GEL Apply 4 g topically 4 (four) times daily. To knees 01/06/18   Copland, Frederico Hamman, MD  divalproex (DEPAKOTE) 500 MG DR tablet Take 1 tablet (500 mg total) by mouth 2 (two) times daily with a meal. 01/15/16   Ria Bush, MD  docusate sodium (COLACE) 100 MG capsule Take 2 capsules (200 mg total) by mouth 2 (two) times daily. 11/23/17   Pucilowska, Wardell Honour, MD  GAVILAX powder TAKE 17 GRAMS BY MOUTH TWICE DAILY AS NEEDED FOR MODERATE CONSTIPATION. 02/11/18   Ria Bush, MD  lamoTRIgine (LAMICTAL) 25 MG tablet Take 2 tablets (50 mg total) by mouth 2 (two) times daily. 11/24/17   Pucilowska, Wardell Honour, MD  levothyroxine (SYNTHROID, LEVOTHROID) 50 MCG tablet Take 1 tablet (50 mcg total) by mouth  daily before breakfast. 10/21/17   Ria Bush, MD  losartan (COZAAR) 50 MG tablet Take 1 tablet (50 mg total) by mouth daily. 07/03/17   Ria Bush, MD  MELATONIN PO Take 1 tablet by mouth at bedtime.    [provider]  metoprolol tartrate (LOPRESSOR) 25 MG tablet Take 0.5 tablets (12.5 mg total) by mouth 2 (two) times daily. 11/23/17   Clovis Fredrickson, MD  paliperidone (INVEGA SUSTENNA) 234 MG/1.5ML SUSP injection Inject 234 mg into the muscle every 30 (thirty) days.    [provider]  QUEtiapine (SEROQUEL) 300 MG tablet Take 1 tablet (300 mg total) by mouth at bedtime. 03/01/18   Ria Bush, MD  senna (SENOKOT) 8.6 MG TABS tablet Take 1 tablet (8.6 mg total) by mouth at bedtime as needed for mild constipation. 11/09/17   Pucilowska, Jolanta B, MD  simvastatin (ZOCOR) 20 MG tablet Take 1 tablet (20 mg total) by mouth at bedtime. 07/03/17   Ria Bush, MD  sitaGLIPtin (JANUVIA) 25 MG tablet Take 1 tablet (25 mg total) by mouth daily. 02/18/18   Ria Bush, MD  triamcinolone cream (KENALOG) 0.1 % Apply 1 application topically at bedtime. Apply to [place]. 02/18/18 02/18/19  Ria Bush, MD    REVIEW OF SYSTEMS:  Review of Systems  Constitutional: Negative for chills, fever, malaise/fatigue and weight loss.  HENT: Negative for ear pain, hearing loss and tinnitus.   Eyes: Negative for blurred vision, double vision, pain and redness.  Respiratory: Negative for cough, hemoptysis and shortness of breath.   Cardiovascular: Negative for chest pain, palpitations, orthopnea and leg swelling.  Gastrointestinal: Negative for abdominal pain, constipation, diarrhea, nausea and vomiting.  Genitourinary: Negative for dysuria, frequency and hematuria.  Musculoskeletal: Negative for back pain, joint pain and neck pain.  Skin:       No acne, rash, or lesions  Neurological: Negative for dizziness, tremors, focal weakness and weakness.  Endo/Heme/Allergies:  Negative for polydipsia. Does not bruise/bleed easily.  Psychiatric/Behavioral: Negative for depression. The patient is not nervous/anxious and does not have insomnia.      VITAL SIGNS:   Vitals:   03/15/18 2307  Pulse: 64  Resp: 14  Temp: (!) 96.7 F (35.9 C)  TempSrc: Axillary  SpO2: 98%   Wt Readings from Last 3 Encounters:  03/11/18 89.8 kg  03/11/18 92.1 kg  03/01/18 92.2 kg    PHYSICAL EXAMINATION:  Physical Exam  LABORATORY PANEL:   CBC Recent Labs  Lab 03/11/18 1602  WBC 7.5  HGB 13.4  HCT 37.6  PLT 125*   ------------------------------------------------------------------------------------------------------------------  Chemistries  Recent Labs  Lab 03/11/18 1439  03/14/18 0724  NA 136   < > 135  K 5.3*   < > 4.5  CL 104   < > 96*  CO2 22   < > 27  GLUCOSE 282*   < > 155*  BUN 23   < > 29*  CREATININE 1.58*   < > 1.73*  CALCIUM 9.7   < > 8.9  AST 37  --   --   ALT 24  --   --   ALKPHOS 80  --   --   BILITOT 0.5  --   --    < > = values in this interval not displayed.   ------------------------------------------------------------------------------------------------------------------  Cardiac Enzymes No results for input(s): TROPONINI in the last 168 hours. ------------------------------------------------------------------------------------------------------------------  RADIOLOGY:  No results found.  EKG:   Orders placed or performed during the hospital encounter of 11/29/17  . ED EKG  . ED EKG  . EKG    IMPRESSION AND PLAN:  Principal Problem:   Hypotension -most likely related to the dose of Restoril that she got.  Pressures have improved some with IV fluids, continue aggressive IV fluids, pressors if needed Active Problems:   Bipolar I disorder, current or most recent episoe manic, with psychotic features [facility]) -psychiatry consult, continue meds except for  Restoril   OSA on CPAP -CPAP nightly   Controlled type 2 diabetes  mellitus with diabetic nephropathy (HCC) -sliding scale insulin with corresponding glucose checks   CKD (chronic kidney disease) stage 4, GFR 15-29 ml/min (HCC) -avoid nephrotoxins and monitor  Chart review performed and case discussed with ED provider. Labs, imaging and/or ECG reviewed by provider and discussed with patient/family. Management plans discussed with the patient and/or family.  DVT PROPHYLAXIS: SubQ lovenox   GI PROPHYLAXIS:  None  ADMISSION STATUS: Inpatient     CODE STATUS: Full    Code Status Orders  (From admission, onward)         Start     Ordered   03/15/18 2331  Full code  Continuous     03/15/18 2331        Code Status History    Date Active Date Inactive Code Status Order ID Comments User Context   03/11/2018 2329 03/15/2018 2305 Full Code [id number]  Clovis Fredrickson, MD Inpatient   11/13/2017 2220 11/24/2017 1542 Full Code [id number]  [name], MD Inpatient   11/04/2017 1746 11/09/2017 1600 Full Code [id number]  [name], MD Inpatient   12/03/2015 2043 12/19/2015 1439 Full Code [id number]  [name], MD Inpatient   04/13/2012 1506 05/15/2012 1637 Full Code [id number]  Ermalinda Memos Inpatient      TOTAL TIME TAKING CARE OF THIS PATIENT: 45 minutes.   Hilario Robarts [place] 03/15/2018, 11:35 PM  [facility]  Office  [phone]  CC: Primary care physician; Ria Bush, MD  Note:  This document was prepared using Dragon voice recognition software and may include unintentional dictation errors.

## 2018-03-15 NOTE — Progress Notes (Signed)
D:Pt denies SI/HI/AVH. Pt is pleasant and cooperative during assessments. Pt. Presents frequently with flights of ideas and/or tangential speech still this evening. Pt. Frequently out of her room around the unit socializing and participating well. Pt. Attends snacks. Pt. Complained of headache this evening that was addressed with PRN medications. Pt. Has trouble giving numbers for pain scaling and other assessment questions. Pt. Denies anxieties or depression. Pt. Reports doing "really good". Pt. Is a bit animated and euphoric in presentation.   A: Q x 15 minute observation checks were completed for safety. Patient was provided with education, but needs reinforcement. Patient was given/offered medications per orders. Patient was encourage to attend groups, participate in unit activities and continue with plan of care. Pt. Chart and plans of care reviewed. Pt. Given support and encouragement.  R:Patient is complaint with medication and unit procedures. Blood sugars monitored per orders. Pt. Pain being addressed with provider orders.             Precautionary checks every 15 minutes for safety maintained, room free of safety hazards, patient sustains no injury or falls during this shift. Will endorse care to next shift.

## 2018-03-15 NOTE — [facility] Suicide Risk Assessment (Addendum)
[facility] Discharge Suicide Risk Assessment   Principal Problem: Bipolar I disorder, current or most recent episode manic, with psychotic features [facility]) Discharge Diagnoses:  Patient Active Problem List   Diagnosis Date Noted  . Bipolar I disorder, current or most recent episode manic, with psychotic features ([place]) [F31.2] 11/04/2017    Priority: High  . Rash of hands [R21] 02/18/2018  . Pain of right heel [M79.671] 12/30/2017  . Encounter for chronic pain management [G89.29] 12/18/2017  . Foot pain, bilateral [id number], M79.672] 11/25/2017  . Gallbladder polyp [K82.4] 10/21/2017  . Left shoulder pain [M25.512] 10/21/2017  . Benign neoplasm of ascending colon [D12.2]   . Low back pain radiating to left lower extremity [M54.5, M79.605] 02/26/2017  . Acquired hypothyroidism [E03.9] 09/10/2016  . Thrombocytopenia ([place]) [D69.6] 06/15/2016  . Medicare annual wellness visit, subsequent [Z00.00] 06/04/2016  . Advanced care planning/counseling discussion [Z71.89] 06/04/2016  . External hemorrhoid [K64.4] 04/02/2016  . Slow transit constipation [K59.01] 01/04/2016  . Bipolar I disorder, most recent episode (or current) manic ([place]) [F31.10] 12/03/2015  . NAFLD (nonalcoholic fatty liver disease) [K76.0] 03/28/2015  . Gallbladder calculus without cholecystitis [K80.20] 03/28/2015  . Gout [M10.9] 03/27/2015  . HCV antibody positive [R76.8] 03/27/2015  . History of bladder cancer [Z85.51] 03/27/2015  . History of bundle branch block [Z86.79] 03/27/2015  . Genetic testing [Z13.79] 02/28/2015  . Loss of memory [R41.3] 09/04/2014  . Controlled type 2 diabetes mellitus with diabetic nephropathy ([place]) [E11.21] 06/07/2014  . Hyperlipidemia [E78.5]   . Anemia in CKD (chronic kidney disease) [N18.9, D63.1] 01/26/2013  . Personal history of colonic adenomas and colon cancer [Z86.010] 11/11/2012  . Osteoarthritis of knee [M17.10] 10/02/2012  . OSA on CPAP [G47.33, Z99.89]   . CKD (chronic kidney disease)  stage 4, GFR 15-29 ml/min (HCC) [N18.4]   . Severe obesity (BMI 35.0-39.9) with comorbidity ([place]) [E66.01]   . HTN (hypertension), benign [I10] 04/07/2012  . Hx of colon cancer, stage I [Z85.038] 08/05/2011    Total Time spent with patient: 20 minutes  Musculoskeletal: Strength & Muscle Tone: within normal limits Gait & Station: normal Patient leans: N/A  Psychiatric Specialty Exam: Review of Systems  Neurological: Positive for headaches.  Psychiatric/Behavioral: The patient has insomnia.   All other systems reviewed and are negative.   Blood pressure 127/83, pulse 89, temperature 98 F (36.7 C), temperature source Oral, resp. rate 12, height 5\' 3"  (1.6 m), weight 89.8 kg, SpO2 100 %.Body mass index is 35.07 kg/m.  General Appearance: Casual  Eye Contact::  Good  Speech:  Clear and Coherent and Pressured409  Volume:  Normal  Mood:  Euphoric  Affect:  Congruent  Thought Process:  Goal Directed and Descriptions of Associations: Intact  Orientation:  Full (Time, Place, and Person)  Thought Content:  Delusions  Suicidal Thoughts:  No  Homicidal Thoughts:  No  Memory:  Immediate;   Fair Recent;   Fair Remote;   Fair  Judgement:  Impaired  Insight:  Present  Psychomotor Activity:  Increased  Concentration:  Fair  Recall:  [employer] of Knowledge:Fair  Language: Fair  Akathisia:  No  Handed:  Right  AIMS (if indicated):     Assets:  Communication Skills Desire for Improvement Financial Resources/Insurance Housing Resilience Social Support  Sleep:  Number of Hours: 4.15  Cognition: WNL  ADL's:  Intact   Mental Status Per Nursing Assessment::   On Admission:  NA  Demographic Factors:  Divorced or widowed and Caucasian  Loss Factors: NA  Historical Factors: Family history of mental illness or substance abuse and Impulsivity  Risk Reduction Factors:   Sense of responsibility to family, Religious beliefs about death, Living with another person, especially a  relative, Positive social support and Positive therapeutic relationship  Continued Clinical Symptoms:  Bipolar Disorder:   Mixed State  Cognitive Features That Contribute To Risk:  None    Suicide Risk:  Minimal: No identifiable suicidal ideation.  Patients presenting with no risk factors but with morbid ruminations; may be classified as minimal risk based on the severity of the depressive symptoms    Plan Of Care/Follow-up recommendations:  Activity:  as tolerated Diet:  low sodium heart healthy ADA diet Other:  keep follow up appointments  Orson Slick, MD 03/15/2018, 10:29 PM

## 2018-03-16 ENCOUNTER — Inpatient Hospital Stay (HOSPITAL_COMMUNITY)
Admission: AD | Admit: 2018-03-16 | Discharge: 2018-04-05 | Disposition: A | Discharge status: Home / Self Care | Payer: Medicare Other | Source: Intra-hospital | Attending: Psychiatry | Admitting: Psychiatry

## 2018-03-16 ENCOUNTER — Other Ambulatory Visit: Payer: Self-pay

## 2018-03-16 DIAGNOSIS — Z9989 Dependence on other enabling machines and devices: Secondary | ICD-10-CM

## 2018-03-16 DIAGNOSIS — M1710 Unilateral primary osteoarthritis, unspecified knee: Secondary | ICD-10-CM | POA: Diagnosis present

## 2018-03-16 DIAGNOSIS — R4182 Altered mental status, unspecified: Secondary | ICD-10-CM | POA: Diagnosis present

## 2018-03-16 DIAGNOSIS — R112 Nausea with vomiting, unspecified: Secondary | ICD-10-CM

## 2018-03-16 DIAGNOSIS — G4733 Obstructive sleep apnea (adult) (pediatric): Secondary | ICD-10-CM

## 2018-03-16 DIAGNOSIS — M179 Osteoarthritis of knee, unspecified: Secondary | ICD-10-CM | POA: Diagnosis present

## 2018-03-16 DIAGNOSIS — I959 Hypotension, unspecified: Secondary | ICD-10-CM | POA: Diagnosis not present

## 2018-03-16 DIAGNOSIS — D696 Thrombocytopenia, unspecified: Secondary | ICD-10-CM | POA: Diagnosis present

## 2018-03-16 DIAGNOSIS — I952 Hypotension due to drugs: Secondary | ICD-10-CM | POA: Diagnosis not present

## 2018-03-16 DIAGNOSIS — F312 Bipolar disorder, current episode manic severe with psychotic features: Principal | ICD-10-CM

## 2018-03-16 DIAGNOSIS — E039 Hypothyroidism, unspecified: Secondary | ICD-10-CM | POA: Diagnosis present

## 2018-03-16 DIAGNOSIS — I10 Essential (primary) hypertension: Secondary | ICD-10-CM | POA: Diagnosis present

## 2018-03-16 LAB — CBC WITH DIFFERENTIAL/PLATELET
BASOS PCT: 0 %
Basophils Absolute: 0 10*3/uL (ref 0–0.1)
EOS ABS: 0.1 10*3/uL (ref 0–0.7)
EOS PCT: 2 %
HCT: 32.3 % — ABNORMAL LOW (ref 35.0–47.0)
Hemoglobin: 11.1 g/dL — ABNORMAL LOW (ref 12.0–16.0)
Lymphocytes Relative: 28 %
Lymphs Abs: 1.9 10*3/uL (ref 1.0–3.6)
MCH: 33.9 pg (ref 26.0–34.0)
MCHC: 34.4 g/dL (ref 32.0–36.0)
MCV: 98.6 fL (ref 80.0–100.0)
MONO ABS: 1.6 10*3/uL — AB (ref 0.2–0.9)
MONOS PCT: 24 %
Neutro Abs: 3 10*3/uL (ref 1.4–6.5)
Neutrophils Relative %: 46 %
PLATELETS: 93 10*3/uL — AB (ref 150–440)
RBC: 3.28 MIL/uL — ABNORMAL LOW (ref 3.80–5.20)
RDW: 14.2 % (ref 11.5–14.5)
WBC: 6.7 10*3/uL (ref 3.6–11.0)

## 2018-03-16 LAB — BASIC METABOLIC PANEL
ANION GAP: 5 (ref 5–15)
BUN: 29 mg/dL — AB (ref 8–23)
CO2: 28 mmol/L (ref 22–32)
Calcium: 8.4 mg/dL — ABNORMAL LOW (ref 8.9–10.3)
Chloride: 106 mmol/L (ref 98–111)
Creatinine, Ser: 1.41 mg/dL — ABNORMAL HIGH (ref 0.44–1.00)
GFR calc Af Amer: 44 mL/min — ABNORMAL LOW (ref >60)
GFR, EST NON AFRICAN AMERICAN: 38 mL/min — AB (ref >60)
GLUCOSE: 115 mg/dL — AB (ref 70–99)
Potassium: 4.6 mmol/L (ref 3.5–5.1)
SODIUM: 139 mmol/L (ref 135–145)

## 2018-03-16 LAB — COMPREHENSIVE METABOLIC PANEL
ALK PHOS: 62 U/L (ref 38–126)
ALT: 20 U/L (ref 0–44)
AST: 28 U/L (ref 15–41)
Albumin: 3.1 g/dL — ABNORMAL LOW (ref 3.5–5.0)
Anion gap: 9 (ref 5–15)
BILIRUBIN TOTAL: 0.5 mg/dL (ref 0.3–1.2)
BUN: 33 mg/dL — AB (ref 8–23)
CALCIUM: 8.4 mg/dL — AB (ref 8.9–10.3)
CHLORIDE: 103 mmol/L (ref 98–111)
CO2: 25 mmol/L (ref 22–32)
CREATININE: 1.56 mg/dL — AB (ref 0.44–1.00)
GFR, EST AFRICAN AMERICAN: 39 mL/min — AB (ref >60)
GFR, EST NON AFRICAN AMERICAN: 33 mL/min — AB (ref >60)
Glucose, Bld: 149 mg/dL — ABNORMAL HIGH (ref 70–99)
Potassium: 5 mmol/L (ref 3.5–5.1)
Sodium: 137 mmol/L (ref 135–145)
Total Protein: 5.5 g/dL — ABNORMAL LOW (ref 6.5–8.1)

## 2018-03-16 LAB — CBC
HCT: 31.7 % — ABNORMAL LOW (ref 35.0–47.0)
Hemoglobin: 11.1 g/dL — ABNORMAL LOW (ref 12.0–16.0)
MCH: 34.2 pg — ABNORMAL HIGH (ref 26.0–34.0)
MCHC: 35 g/dL (ref 32.0–36.0)
MCV: 97.8 fL (ref 80.0–100.0)
PLATELETS: 98 10*3/uL — AB (ref 150–440)
RBC: 3.24 MIL/uL — AB (ref 3.80–5.20)
RDW: 14.2 % (ref 11.5–14.5)
WBC: 7.4 10*3/uL (ref 3.6–11.0)

## 2018-03-16 LAB — GLUCOSE, CAPILLARY
GLUCOSE-CAPILLARY: 117 mg/dL — AB (ref 70–99)
GLUCOSE-CAPILLARY: 142 mg/dL — AB (ref 70–99)
Glucose-Capillary: 151 mg/dL — ABNORMAL HIGH (ref 70–99)
Glucose-Capillary: 94 mg/dL (ref 70–99)
Glucose-Capillary: 112 mg/dL — ABNORMAL HIGH (ref 70–99)

## 2018-03-16 LAB — MRSA PCR SCREENING: MRSA by PCR: POSITIVE — AB

## 2018-03-16 MED ORDER — DIVALPROEX SODIUM 250 MG PO DR TAB
250.0000 mg | DELAYED_RELEASE_TABLET | Freq: Two times a day (BID) | ORAL | Status: DC
  Filled 2018-03-16: qty 1

## 2018-03-16 MED ORDER — DIVALPROEX SODIUM 500 MG PO DR TAB
500.0000 mg | DELAYED_RELEASE_TABLET | Freq: Two times a day (BID) | ORAL | Status: DC
  Filled 2018-03-16: qty 1

## 2018-03-16 MED ORDER — MAGNESIUM HYDROXIDE 400 MG/5ML PO SUSP
30.0000 mL | Freq: Every day | ORAL | Status: DC | PRN
  Administered 2018-03-18: 30 mL via ORAL
  Filled 2018-03-16: qty 30

## 2018-03-16 MED ORDER — SIMVASTATIN 40 MG PO TABS
20.0000 mg | ORAL_TABLET | Freq: Every day | ORAL | Status: DC
  Administered 2018-03-16 – 2018-04-04 (×20): 20 mg via ORAL
  Filled 2018-03-16 (×19): qty 1

## 2018-03-16 MED ORDER — PHENYLEPHRINE HCL-NACL 10-0.9 MG/250ML-% IV SOLN
0.0000 ug/min | INTRAVENOUS | Status: DC
  Administered 2018-03-16: 20 ug/min via INTRAVENOUS
  Filled 2018-03-16: qty 250

## 2018-03-16 MED ORDER — SIMETHICONE 80 MG PO CHEW
80.0000 mg | CHEWABLE_TABLET | Freq: Four times a day (QID) | ORAL | Status: DC
  Administered 2018-03-16 – 2018-04-05 (×71): 80 mg via ORAL
  Filled 2018-03-16 (×73): qty 1

## 2018-03-16 MED ORDER — DIVALPROEX SODIUM 250 MG PO DR TAB
250.0000 mg | DELAYED_RELEASE_TABLET | Freq: Two times a day (BID) | ORAL | Status: DC
  Administered 2018-03-16 – 2018-03-19 (×6): 250 mg via ORAL
  Filled 2018-03-16 (×6): qty 1

## 2018-03-16 MED ORDER — DOCUSATE SODIUM 100 MG PO CAPS
100.0000 mg | ORAL_CAPSULE | Freq: Two times a day (BID) | ORAL | Status: DC
  Administered 2018-03-16 – 2018-04-05 (×36): 100 mg via ORAL
  Filled 2018-03-16 (×37): qty 1

## 2018-03-16 MED ORDER — METOPROLOL TARTRATE 25 MG PO TABS
12.5000 mg | ORAL_TABLET | Freq: Two times a day (BID) | ORAL | Status: DC
  Administered 2018-03-16 – 2018-03-29 (×27): 12.5 mg via ORAL
  Filled 2018-03-16 (×27): qty 1

## 2018-03-16 MED ORDER — LAMOTRIGINE 25 MG PO TABS
50.0000 mg | ORAL_TABLET | Freq: Two times a day (BID) | ORAL | Status: DC
  Administered 2018-03-16: 50 mg via ORAL
  Filled 2018-03-16: qty 2

## 2018-03-16 MED ORDER — INSULIN ASPART 100 UNIT/ML ~~LOC~~ SOLN
0.0000 [IU] | SUBCUTANEOUS | Status: DC
  Administered 2018-03-16: 2 [IU] via SUBCUTANEOUS
  Filled 2018-03-16: qty 1

## 2018-03-16 MED ORDER — METOPROLOL TARTRATE 25 MG PO TABS
12.5000 mg | ORAL_TABLET | Freq: Two times a day (BID) | ORAL | Status: DC
  Administered 2018-03-16: 12.5 mg via ORAL
  Filled 2018-03-16: qty 1

## 2018-03-16 MED ORDER — ACETAMINOPHEN 325 MG PO TABS
650.0000 mg | ORAL_TABLET | Freq: Four times a day (QID) | ORAL | Status: DC | PRN
  Administered 2018-03-20 – 2018-04-04 (×11): 650 mg via ORAL
  Filled 2018-03-16 (×11): qty 2

## 2018-03-16 MED ORDER — LEVOTHYROXINE SODIUM 50 MCG PO TABS
50.0000 ug | ORAL_TABLET | Freq: Every day | ORAL | Status: DC
  Administered 2018-03-17 – 2018-04-05 (×20): 50 ug via ORAL
  Filled 2018-03-16 (×20): qty 1

## 2018-03-16 MED ORDER — QUETIAPINE FUMARATE 200 MG PO TABS
300.0000 mg | ORAL_TABLET | Freq: Every day | ORAL | Status: DC
  Administered 2018-03-16 – 2018-03-23 (×8): 300 mg via ORAL
  Filled 2018-03-16 (×8): qty 1

## 2018-03-16 MED ORDER — CHLORHEXIDINE GLUCONATE CLOTH 2 % EX PADS
6.0000 | MEDICATED_PAD | Freq: Every day | CUTANEOUS | Status: DC
  Administered 2018-03-16: 6 via TOPICAL

## 2018-03-16 MED ORDER — BISACODYL 5 MG PO TBEC
5.0000 mg | DELAYED_RELEASE_TABLET | Freq: Every day | ORAL | Status: DC | PRN
  Administered 2018-03-18: 5 mg via ORAL
  Filled 2018-03-16 (×2): qty 1

## 2018-03-16 MED ORDER — SIMVASTATIN 20 MG PO TABS: 20.0000 mg | ORAL_TABLET | Freq: Every day | ORAL | Status: DC

## 2018-03-16 MED ORDER — POLYETHYLENE GLYCOL 3350 17 G PO PACK
17.0000 g | PACK | Freq: Every day | ORAL | Status: DC
  Administered 2018-03-16 – 2018-04-05 (×17): 17 g via ORAL
  Filled 2018-03-16 (×15): qty 1

## 2018-03-16 MED ORDER — LOSARTAN POTASSIUM 50 MG PO TABS
50.0000 mg | ORAL_TABLET | Freq: Every day | ORAL | Status: DC
  Administered 2018-03-16 – 2018-03-30 (×15): 50 mg via ORAL
  Filled 2018-03-16 (×16): qty 1

## 2018-03-16 MED ORDER — MUPIROCIN 2 % EX OINT
1.0000 "application " | TOPICAL_OINTMENT | Freq: Two times a day (BID) | CUTANEOUS | Status: DC
  Administered 2018-03-16: 1 via NASAL
  Filled 2018-03-16: qty 22

## 2018-03-16 MED ORDER — LINAGLIPTIN 5 MG PO TABS
5.0000 mg | ORAL_TABLET | Freq: Every day | ORAL | Status: DC
  Administered 2018-03-16 – 2018-04-04 (×20): 5 mg via ORAL
  Filled 2018-03-16 (×22): qty 1

## 2018-03-16 MED ORDER — LEVOTHYROXINE SODIUM 50 MCG PO TABS
50.0000 ug | ORAL_TABLET | Freq: Every day | ORAL | Status: DC
Start: 2018-03-17 — End: 2018-03-16

## 2018-03-16 MED ORDER — PALIPERIDONE PALMITATE ER 234 MG/1.5ML IM SUSY: 234.0000 mg | PREFILLED_SYRINGE | INTRAMUSCULAR | Status: DC

## 2018-03-16 MED ORDER — ALUM & MAG HYDROXIDE-SIMETH 200-200-20 MG/5ML PO SUSP
30.0000 mL | ORAL | Status: DC | PRN
  Administered 2018-03-20 – 2018-04-04 (×3): 30 mL via ORAL
  Filled 2018-03-16 (×3): qty 30

## 2018-03-16 MED ORDER — LAMOTRIGINE 25 MG PO TABS
50.0000 mg | ORAL_TABLET | Freq: Two times a day (BID) | ORAL | Status: DC
  Administered 2018-03-16 – 2018-03-19 (×7): 50 mg via ORAL
  Filled 2018-03-16 (×8): qty 2

## 2018-03-16 NOTE — Progress Notes (Signed)
Pt being discharged to [facility]. Handoff report given to [facility], Therapist, sports.

## 2018-03-16 NOTE — Plan of Care (Signed)
New admission.  Problem: Education: Goal: Knowledge of [facility] Education information/materials will improve Outcome: Progressing Goal: Emotional status will improve Outcome: Progressing Goal: Mental status will improve Outcome: Progressing Goal: Verbalization of understanding the information provided will improve Outcome: Progressing   Problem: Activity: Goal: Interest or engagement in activities will improve Outcome: Progressing Goal: Sleeping patterns will improve Outcome: Progressing   Problem: Coping: Goal: Ability to verbalize frustrations and anger appropriately will improve Outcome: Progressing Goal: Ability to demonstrate self-control will improve Outcome: Progressing   Problem: Health Behavior/Discharge Planning: Goal: Compliance with treatment plan for underlying cause of condition will improve Outcome: Progressing   Problem: Safety: Goal: Periods of time without injury will increase Outcome: Progressing   Problem: Activity: Goal: Interest or engagement in leisure activities will improve Outcome: Progressing Goal: Imbalance in normal sleep/wake cycle will improve Outcome: Progressing   Problem: Health Behavior/Discharge Planning: Goal: Ability to make decisions will improve Outcome: Progressing Goal: Compliance with therapeutic regimen will improve Outcome: Progressing   Problem: Safety: Goal: Ability to disclose and discuss suicidal ideas will improve Outcome: Progressing Goal: Ability to identify and utilize support systems that promote safety will improve Outcome: Progressing

## 2018-03-16 NOTE — Plan of Care (Signed)
Pt. Participation in unit activities and groups is good. Pt. Complaint with medications. Pt. Denies SI/Hi and verbally is able to contract for safety. Pt. Monitored for safety by staff. Pt. Presents this evening with heightened flight of ideas and pressured speech. Pt. Is a very poor historian and is unable to this evening with assessments and conversation give clear answers to anything. Pt. Unable to discern whether she is in pain. Pt. Very religiously preoccupied during presentation with this Probation officer.    Problem: Education: Goal: Emotional status will improve Outcome: Not Progressing Goal: Mental status will improve Outcome: Not Progressing   Problem: Activity: Goal: Interest or engagement in activities will improve Outcome: Progressing   Problem: Health Behavior/Discharge Planning: Goal: Compliance with treatment plan for underlying cause of condition will improve Outcome: Progressing   Problem: Safety: Goal: Periods of time without injury will increase Outcome: Progressing   Problem: Health Behavior/Discharge Planning: Goal: Compliance with therapeutic regimen will improve Outcome: Progressing   Problem: Safety: Goal: Ability to disclose and discuss suicidal ideas will improve Outcome: Progressing

## 2018-03-16 NOTE — H&P (Signed)
Psychiatric Admission Assessment Adult  Patient Identification: Chara Marquard MRN:  [id number] Date of Evaluation:  03/16/2018 Chief Complaint:  Depression Principal Diagnosis: Bipolar I disorder, current or most recent episode manic, with psychotic features ([place]) Diagnosis:   Patient Active Problem List   Diagnosis Date Noted  . Bipolar I disorder, current or most recent episode manic, with psychotic features ([place]) [F31.2] 11/04/2017    Priority: High  . Altered mental status, unspecified [R41.82] 03/16/2018  . Hypotension [I95.9] 03/15/2018  . Rash of hands [R21] 02/18/2018  . Pain of right heel [M79.671] 12/30/2017  . Encounter for chronic pain management [G89.29] 12/18/2017  . Foot pain, bilateral [id number], M79.672] 11/25/2017  . Gallbladder polyp [K82.4] 10/21/2017  . Left shoulder pain [M25.512] 10/21/2017  . Benign neoplasm of ascending colon [D12.2]   . Low back pain radiating to left lower extremity [M54.5, M79.605] 02/26/2017  . Acquired hypothyroidism [E03.9] 09/10/2016  . Thrombocytopenia ([place]) [D69.6] 06/15/2016  . Medicare annual wellness visit, subsequent [Z00.00] 06/04/2016  . Advanced care planning/counseling discussion [Z71.89] 06/04/2016  . External hemorrhoid [K64.4] 04/02/2016  . Slow transit constipation [K59.01] 01/04/2016  . Bipolar I disorder, most recent episode (or current) manic ([place]) [F31.10] 12/03/2015  . NAFLD (nonalcoholic fatty liver disease) [K76.0] 03/28/2015  . Gallbladder calculus without cholecystitis [K80.20] 03/28/2015  . Gout [M10.9] 03/27/2015  . HCV antibody positive [R76.8] 03/27/2015  . History of bladder cancer [Z85.51] 03/27/2015  . History of bundle branch block [Z86.79] 03/27/2015  . Genetic testing [Z13.79] 02/28/2015  . Loss of memory [R41.3] 09/04/2014  . Controlled type 2 diabetes mellitus with diabetic nephropathy ([place]) [E11.21] 06/07/2014  . Hyperlipidemia [E78.5]   . Anemia in CKD (chronic kidney disease) [N18.9,  D63.1] 01/26/2013  . Personal history of colonic adenomas and colon cancer [Z86.010] 11/11/2012  . Osteoarthritis of knee [M17.10] 10/02/2012  . OSA on CPAP [G47.33, Z99.89]   . CKD (chronic kidney disease) stage 4, GFR 15-29 ml/min (HCC) [N18.4]   . Severe obesity (BMI 35.0-39.9) with comorbidity ([place]) [E66.01]   . HTN (hypertension), benign [I10] 04/07/2012  . Hx of colon cancer, stage I [Z85.038] 08/05/2011   History of Present Illness:   Identifying data. Ms. Hoffmann is a 67 year old female with a history of bipolar disorder.  Chief complaint. "What happened?"  History of present illness. Information was obtained from the patient and the chart. The patient came to the hospital with full blown mania in the context of medications adjustments. Due to thrombocytopenia, Depakote is being tapered of and Lamictal titrated up. This resulted in break through manic symptoms with severe insomnia. When Restoril 30 mg was added to her regimen last night, she became hypotonic and was transferred to ICU. He return feeling physically better but puzzled about what has happened. She continue to be hypomanic with religious preoccupation, restlessness, intrusiveness and hyper varbality. She denies any symptoms of depression, psychosis or anxiety. She uses no drugs or alcohol.  Past psychiatric history. Long history of bipolar with multiple hospitalizations and medication trials. Did well on Seroquel, Invega and Depakote for a few months. Denies suicide attempts. Follows up with [facility] team.  Family psychiatric history, Sister with intellectual disability.  Social history. Disabled from mental illness. Lives with her sister.   Total Time spent with patient: 45 minutes  Is the patient at risk to self? No.  Has the patient been a risk to self in the past 6 months? No.  Has the patient been a risk to self within the distant  past? No.  Is the patient a risk to others? No.  Has the patient been a risk to  others in the past 6 months? No.  Has the patient been a risk to others within the distant past? No.   Prior Inpatient Therapy:   Prior Outpatient Therapy:    Alcohol Screening: 1. How often do you have a drink containing alcohol?: Never 2. How many drinks containing alcohol do you have on a typical day when you are drinking?: 1 or 2 3. How often do you have six or more drinks on one occasion?: Never AUDIT-C Score: 0 4. How often during the last year have you found that you were not able to stop drinking once you had started?: Never 5. How often during the last year have you failed to do what was normally expected from you becasue of drinking?: Never 6. How often during the last year have you needed a first drink in the morning to get yourself going after a heavy drinking session?: Never 7. How often during the last year have you had a feeling of guilt of remorse after drinking?: Never 8. How often during the last year have you been unable to remember what happened the night before because you had been drinking?: Never 9. Have you or someone else been injured as a result of your drinking?: No 10. Has a relative or friend or a doctor or another health worker been concerned about your drinking or suggested you cut down?: No Alcohol Use Disorder Identification Test Final Score (AUDIT): 0 Intervention/Follow-up: AUDIT Score <7 follow-up not indicated Substance Abuse History in the last 12 months:  No. Consequences of Substance Abuse: NA Previous Psychotropic Medications: Yes  Psychological Evaluations: No  Past Medical History:  Past Medical History:  Diagnosis Date  . Anemia in chronic kidney disease   . Bipolar depression [facility])    sees psychiatrist - psych admission 03/2015  . Bladder cancer ([place]) 1990's  . Blood transfusion without reported diagnosis 2009  . Cataract    left  . CKD (chronic kidney disease) stage 3, GFR 30-59 ml/min (HCC)   . Colon cancer ([place]) 1990's  . DJD  (degenerative joint disease), lumbar    chronic lower back pain  . Enterocutaneous fistula 04/07/2012   completed PT 06/2012 ([facility])  . Family history of breast cancer   . Family history of colon cancer   . Family history of stomach cancer   . GERD (gastroesophageal reflux disease)   . History of bladder cancer 1997  . History of colon cancer    s/p surgery  . History of uterine cancer    s/p hysterectomy  . Hyperlipidemia   . Hypertension   . IDA (iron deficiency anemia) 01/2013   thought due to h/o polyps  . Obesity (BMI 30-39.9)   . OSA on CPAP    6cm H2O  . Personal history of colonic adenomas and colon cancer 11/11/2012  . Positive hepatitis C antibody test 09/2014   but negative confirmatory testing  . RBBB   . Uncontrolled type 2 diabetes mellitus with nephropathy ([place]) 06/07/2014   completed DSME 02/2016    Past Surgical History:  Procedure Laterality Date  . BREAST BIOPSY Right 01/2014   fibroadenoma  . COLONOSCOPY  07/2009  . COLONOSCOPY  11/2012   2 tubular adenomas, mild diverticulosis, pending genetic testing for Lynch syndrome [name]) rpt 2 yrs  . COLONOSCOPY  02/2015   TA, diverticulosis, rpt 2 yrs [name])  . COLONOSCOPY  WITH PROPOFOL N/A 06/09/2017   TAx1, rpt 2 yrs [name], [name], MD)  . DEXA  12/2009   WNL  . DOBUTAMINE STRESS ECHO  12/2009   no evidence of ischemia  . HERNIA REPAIR  02/04/12  . i & d abdominal wound  02/19/12  . LEFT OOPHORECTOMY  2005  . PARTIAL COLECTOMY  about 2008   for colon cancer  . PARTIAL HYSTERECTOMY  1981   uterine cancer, R ovary remains  . Reexploration of abdominal wound and Allograft placemet  02/26/12  . Removal of infected mesh and abdominal wound vac placement  02/24/12  . sleep study  02/2014   OSA - [facility] 55, nadir 81% [name])   Family History:  Family History  Problem Relation Age of Onset  . Colon cancer Mother 42  . Stomach cancer Mother        dx in her 41s?  . CAD Father        MI; deceased 2  . Mental  illness Sister        anxiety/depression  . Thyroid disease Sister   . Breast cancer Maternal Grandmother        age at dx unknown  . Diabetes Brother   . Diabetes Brother   . Diabetes Other        aunts and uncles both sides  . Arthritis Other        strong fmhx  . Colon cancer Other 35       maternal half-sister; deceased    Tobacco Screening: Have you used any form of tobacco in the last 30 days? (Cigarettes, Smokeless Tobacco, Cigars, and/or Pipes): No Social History:  Social History   Substance and Sexual Activity  Alcohol Use No  . Alcohol/week: 0.0 standard drinks     Social History   Substance and Sexual Activity  Drug Use No    Additional Social History:                           Allergies:   Allergies  Allergen Reactions  . Risperidone And Related Other (See Comments)    Reaction:  Altered mental status   . Penicillins Other (See Comments)    Pt states that this med knocks her out.  Has patient had a PCN reaction causing immediate rash, facial/tongue/throat swelling, SOB or lightheadedness with hypotension Unsure  Has patient had a PCN reaction causing severe rash involving mucus membranes or skin necrosis Unsure  Has patient had a PCN reaction that required hospitalization Unsure  Has patient had a PCN reaction occurring within the last 10 years Unsure  If all of the above answers are "NO", then may proceed with Cephalosporin use.  Clementeen Hoof [Iodinated Diagnostic Agents] Rash  . Tetanus Toxoids Rash       . Zetia [Ezetimibe] Rash   Lab Results:  Results for orders placed or performed during the hospital encounter of 03/16/18 (from the past 48 hour(s))  Glucose, capillary     Status: Abnormal   Collection Time: 03/16/18  3:54 PM  Result Value Ref Range   Glucose-Capillary 112 (H) 70 - 99 mg/dL    Blood Alcohol level:  Lab Results  Component Value Date   ETH <10 03/11/2018   ETH <10 53/29/9242    Metabolic Disorder Labs:  Lab  Results  Component Value Date   HGBA1C 6.6 (H) 02/18/2018   MPG 122.63 11/14/2017   MPG 114 04/14/2012   Lab  Results  Component Value Date   PROLACTIN 33.6 (H) 12/04/2015   Lab Results  Component Value Date   CHOL 140 11/14/2017   TRIG 152 (H) 11/14/2017   HDL 50 11/14/2017   CHOLHDL 2.8 11/14/2017   VLDL 30 11/14/2017   LDLCALC 60 11/14/2017   LDLCALC 81 09/30/2017    Current Medications: Current Facility-Administered Medications  Medication Dose Route Frequency Provider Last Rate Last Dose  . acetaminophen (TYLENOL) tablet 650 mg  650 mg Oral Q6H PRN Ady Heimann B, MD      . alum & mag hydroxide-simeth (MAALOX/MYLANTA) 200-200-20 MG/5ML suspension 30 mL  30 mL Oral Q4H PRN Aronda Burford B, MD      . bisacodyl (DULCOLAX) EC tablet 5 mg  5 mg Oral Daily PRN Kimberley Dastrup B, MD      . divalproex (DEPAKOTE) DR tablet 250 mg  250 mg Oral Q12H Yuvin Bussiere B, MD      . docusate sodium (COLACE) capsule 100 mg  100 mg Oral BID Azaria Stegman, Crystalina Stodghill B, MD   100 mg at 03/16/18 1745  . lamoTRIgine (LAMICTAL) tablet 50 mg  50 mg Oral BID Jarreau Callanan B, MD   50 mg at 03/16/18 1746  . [START ON 03/17/2018] levothyroxine (SYNTHROID, LEVOTHROID) tablet 50 mcg  50 mcg Oral QAC breakfast Douglass Dunshee B, MD      . linagliptin (TRADJENTA) tablet 5 mg  5 mg Oral Daily Conard Alvira B, MD   5 mg at 03/16/18 1745  . losartan (COZAAR) tablet 50 mg  50 mg Oral Daily Isla Sabree B, MD   50 mg at 03/16/18 1745  . magnesium hydroxide (MILK OF MAGNESIA) suspension 30 mL  30 mL Oral Daily PRN Madalee Altmann B, MD      . metoprolol tartrate (LOPRESSOR) tablet 12.5 mg  12.5 mg Oral BID Tahara Ruffini B, MD   12.5 mg at 03/16/18 1746  . [START ON 04/06/2018] paliperidone (INVEGA SUSTENNA) injection 234 mg  234 mg Intramuscular Q28 days Asharia Lotter B, MD      . polyethylene glycol (MIRALAX / GLYCOLAX) packet 17 g  17 g Oral Daily ,   B, MD   17 g at 03/16/18 1745  . QUEtiapine (SEROQUEL) tablet 300 mg  300 mg Oral QHS Lavren Lewan B, MD      . simethicone (MYLICON) chewable tablet 80 mg  80 mg Oral QID Monea Pesantez B, MD   80 mg at 03/16/18 1746  . simvastatin (ZOCOR) tablet 20 mg  20 mg Oral q1800 Clytie Shetley B, MD   20 mg at 03/16/18 1747   PTA Medications: Medications Prior to Admission  Medication Sig Dispense Refill Last Dose  . ACCU-CHEK AVIVA PLUS test strip 1 each by Other route daily. E11.21 100 each 3 Taking  . clotrimazole (LOTRIMIN) 1 % cream Apply 1 application topically 2 (two) times daily. Apply to toes   Past Month at Unknown time  . diclofenac sodium (VOLTAREN) 1 % GEL Apply 4 g topically 4 (four) times daily. To knees 5 Tube 5 Past Month at Unknown time  . divalproex (DEPAKOTE) 250 MG DR tablet Take 250 mg by mouth 2 (two) times daily.   unknown at unknown  . docusate sodium (COLACE) 100 MG capsule Take 2 capsules (200 mg total) by mouth 2 (two) times daily. 60 capsule 1 Past Month at Unknown time  . GAVILAX powder TAKE 17 GRAMS BY MOUTH TWICE DAILY AS NEEDED FOR MODERATE CONSTIPATION. (Patient taking  differently: Take 17 g by mouth 2 (two) times daily as needed for moderate constipation. ) 510 g 1 PRN at PRN  . lamoTRIgine (LAMICTAL) 25 MG tablet Take 2 tablets (50 mg total) by mouth 2 (two) times daily. (Patient taking differently: Take 50 mg by mouth every morning. ) 120 tablet 1 Past Month at Unknown time  . levothyroxine (SYNTHROID, LEVOTHROID) 50 MCG tablet Take 1 tablet (50 mcg total) by mouth daily before breakfast. 30 tablet 11 Past Month at Unknown time  . losartan (COZAAR) 50 MG tablet Take 1 tablet (50 mg total) by mouth daily. 30 tablet 11 Past Month at Unknown time  . metoprolol tartrate (LOPRESSOR) 25 MG tablet Take 0.5 tablets (12.5 mg total) by mouth 2 (two) times daily. 60 tablet 1 Past Month at Unknown time  . paliperidone (INVEGA SUSTENNA) 234 MG/1.5ML SUSP  injection Inject 234 mg into the muscle every 30 (thirty) days.   Past Month at Unknown time  . QUEtiapine (SEROQUEL) 300 MG tablet Take 1 tablet (300 mg total) by mouth at bedtime.   Past Month at Unknown time  . simvastatin (ZOCOR) 20 MG tablet Take 1 tablet (20 mg total) by mouth at bedtime. 30 tablet 11 Past Month at Unknown time  . sitaGLIPtin (JANUVIA) 25 MG tablet Take 1 tablet (25 mg total) by mouth daily. 30 tablet 3 Past Month at Unknown time  . triamcinolone cream (KENALOG) 0.1 % Apply 1 application topically at bedtime. Apply to AA. 30 g 0 Past Month at Unknown time  . bisacodyl (DULCOLAX) 10 MG suppository Place 1 suppository (10 mg total) rectally daily as needed for moderate constipation. (Patient not taking: Reported on 03/16/2018) 12 suppository 0 Not Taking at Unknown time  . divalproex (DEPAKOTE) 500 MG DR tablet Take 1 tablet (500 mg total) by mouth 2 (two) times daily with a meal. (Patient not taking: Reported on 03/16/2018) 60 tablet 3 Not Taking at Unknown time  . senna (SENOKOT) 8.6 MG TABS tablet Take 1 tablet (8.6 mg total) by mouth at bedtime as needed for mild constipation. (Patient not taking: Reported on 03/16/2018) 120 each 0 Not Taking at Unknown time    Musculoskeletal: Strength & Muscle Tone: within normal limits Gait & Station: normal Patient leans: N/A  Psychiatric Specialty Exam: I reviewed physical exam performed in ICU and agree with the findings. Physical Exam  Nursing note and vitals reviewed. Psychiatric: Her affect is labile and inappropriate. Her speech is rapid and/or pressured. She is withdrawn. Thought content is delusional. Cognition and memory are impaired. She expresses impulsivity.    Review of Systems  Neurological: Negative.   Psychiatric/Behavioral: Positive for hallucinations. The patient has insomnia.   All other systems reviewed and are negative.   Blood pressure 132/77, pulse 85, temperature 98.8 F (37.1 C), temperature source Oral,  resp. rate 19, height 5\' 2"  (1.575 m), weight 87.1 kg, SpO2 98 %.Body mass index is 35.12 kg/m.  See SRA                                                  Sleep:       Treatment Plan Summary: Daily contact with patient to assess and evaluate symptoms and progress in treatment and Medication management   Ms. Bauer is a 67 year old female with a history of bipolar disorder admitted for a manic  episode in the context of medication changes. She had severe insomnia and was given 30 mg of Restoril tonight causinh hypotonia and was briefly hospitalized at ICU.  #Bipolar disorder, hypomania -continue Invega sustenna injection 234 mg every 28 days, next dose on 9/3 -continue Seroquel 300 mg nightlyfor mood stabilization, anxiety and depression -continue Depakote taper 250 mg BID now, due to thrombocytopenia -continue Lamictal 50 mg BID  #Thrombocytopenia -Patient is being tapered off of Depakoteand started on Lamictal  #Insomnia -Melatonin 10 mg nightly  #Sleep apnea -CPAP nightly  #Hypothyroidism -Continue Synthroid 50 mcg p.o. daily  #HTN -Continue metoprolol 12.5 mg p.o. twice daily and Cozaar 50 mg p.o. daily  #DM -Tradjenta 5 mg daily -SSI, CBG, ADA diet  #Constipation -bowel regimen  #Dislipidemia  -Zocor 20 mg daily  #Labs -Hemoglobin A1c was 6.6 and total cholesterol was 140  #Disposition -discharge to home with the sister -follow up with [facility]   Observation Level/Precautions:  15 minute checks  Laboratory:  CBC Chemistry Profile UDS UA  Psychotherapy:    Medications:    Consultations:    Discharge Concerns:    Estimated LOS:  Other:     Physician Treatment Plan for Primary Diagnosis: Bipolar I disorder, current or most recent episode manic, with psychotic features ([place]) Long Term Goal(s): Improvement in symptoms so as ready for discharge  Short Term Goals: Ability to identify changes in lifestyle to reduce  recurrence of condition will improve, Ability to verbalize feelings will improve, Ability to disclose and discuss suicidal ideas, Ability to demonstrate self-control will improve, Ability to identify and develop effective coping behaviors will improve, Ability to maintain clinical measurements within normal limits will improve and Ability to identify triggers associated with substance abuse/mental health issues will improve  Physician Treatment Plan for Secondary Diagnosis: Principal Problem:   Bipolar I disorder, current or most recent episode manic, with psychotic features ([place]) Active Problems:   HTN (hypertension), benign   OSA on CPAP   Osteoarthritis of knee   Thrombocytopenia ([place])   Acquired hypothyroidism  Long Term Goal(s): NA  Short Term Goals: NA  I certify that inpatient services furnished can reasonably be expected to improve the patient's condition.    Orson Slick, MD 8/13/20196:48 PM

## 2018-03-16 NOTE — Tx Team (Signed)
Initial Treatment Plan 03/16/2018 6:36 PM [name] FIE:[id number]    PATIENT STRESSORS: Medication change or noncompliance   PATIENT STRENGTHS: Ability for insight Motivation for treatment/growth Religious Affiliation Supportive family/friends   PATIENT IDENTIFIED PROBLEMS: Depression  Mild Confusion                   DISCHARGE CRITERIA:  Adequate post-discharge living arrangements Improved stabilization in mood, thinking, and/or behavior Reduction of life-threatening or endangering symptoms to within safe limits  PRELIMINARY DISCHARGE PLAN: Return to previous living arrangement  PATIENT/FAMILY INVOLVEMENT: This treatment plan has been presented to and reviewed with the patient, Ketura Sirek.  The patient has been given the opportunity to ask questions and make suggestions.  Marvie Calender, RN 03/16/2018, 6:36 PM

## 2018-03-16 NOTE — BH Assessment (Signed)
Patient is to be admitted to [facility] by Dr. Bary Leriche.  Attending Physician will be Dr. Bary Leriche.   Patient has been assigned to room 302, by [place]   Intake Paper Work has been Visual merchandiser.

## 2018-03-16 NOTE — BHH Suicide Risk Assessment (Signed)
[facility] Admission Suicide Risk Assessment   Nursing information obtained from:  Patient Demographic factors:  Age 67 or older, Caucasian, Unemployed Current Mental Status:  NA Loss Factors:  NA Historical Factors:  NA Risk Reduction Factors:  Religious beliefs about death, Living with another person, especially a relative, Positive coping skills or problem solving skills  Total Time spent with patient: 45 minutes Principal Problem: Bipolar I disorder, current or most recent episode manic, with psychotic features ([place]) Diagnosis:   Patient Active Problem List   Diagnosis Date Noted  . Bipolar I disorder, current or most recent episode manic, with psychotic features ([place]) [F31.2] 11/04/2017    Priority: High  . Altered mental status, unspecified [R41.82] 03/16/2018  . Hypotension [I95.9] 03/15/2018  . Rash of hands [R21] 02/18/2018  . Pain of right heel [M79.671] 12/30/2017  . Encounter for chronic pain management [G89.29] 12/18/2017  . Foot pain, bilateral [id number], M79.672] 11/25/2017  . Gallbladder polyp [K82.4] 10/21/2017  . Left shoulder pain [M25.512] 10/21/2017  . Benign neoplasm of ascending colon [D12.2]   . Low back pain radiating to left lower extremity [M54.5, M79.605] 02/26/2017  . Acquired hypothyroidism [E03.9] 09/10/2016  . Thrombocytopenia ([place]) [D69.6] 06/15/2016  . Medicare annual wellness visit, subsequent [Z00.00] 06/04/2016  . Advanced care planning/counseling discussion [Z71.89] 06/04/2016  . External hemorrhoid [K64.4] 04/02/2016  . Slow transit constipation [K59.01] 01/04/2016  . Bipolar I disorder, most recent episode (or current) manic ([place]) [F31.10] 12/03/2015  . NAFLD (nonalcoholic fatty liver disease) [K76.0] 03/28/2015  . Gallbladder calculus without cholecystitis [K80.20] 03/28/2015  . Gout [M10.9] 03/27/2015  . HCV antibody positive [R76.8] 03/27/2015  . History of bladder cancer [Z85.51] 03/27/2015  . History of bundle branch block [Z86.79]  03/27/2015  . Genetic testing [Z13.79] 02/28/2015  . Loss of memory [R41.3] 09/04/2014  . Controlled type 2 diabetes mellitus with diabetic nephropathy ([place]) [E11.21] 06/07/2014  . Hyperlipidemia [E78.5]   . Anemia in CKD (chronic kidney disease) [N18.9, D63.1] 01/26/2013  . Personal history of colonic adenomas and colon cancer [Z86.010] 11/11/2012  . Osteoarthritis of knee [M17.10] 10/02/2012  . OSA on CPAP [G47.33, Z99.89]   . CKD (chronic kidney disease) stage 4, GFR 15-29 ml/min (HCC) [N18.4]   . Severe obesity (BMI 35.0-39.9) with comorbidity ([place]) [E66.01]   . HTN (hypertension), benign [I10] 04/07/2012  . Hx of colon cancer, stage I [Z85.038] 08/05/2011   Subjective Data: psychotic break  Continued Clinical Symptoms:  Alcohol Use Disorder Identification Test Final Score (AUDIT): 0 The "Alcohol Use Disorders Identification Test", Guidelines for Use in Primary Care, Second Edition.  World Pharmacologist [facility]). Score between 0-7:  no or low risk or alcohol related problems. Score between 8-15:  moderate risk of alcohol related problems. Score between 16-19:  high risk of alcohol related problems. Score 20 or above:  warrants further diagnostic evaluation for alcohol dependence and treatment.   CLINICAL FACTORS:   Bipolar Disorder:   Mixed State   Musculoskeletal: Strength & Muscle Tone: within normal limits Gait & Station: normal Patient leans: N/A  Psychiatric Specialty Exam: Physical Exam  Nursing note and vitals reviewed. Psychiatric: Her affect is labile and inappropriate. Her speech is rapid and/or pressured. She is hyperactive. Thought content is delusional. Cognition and memory are impaired. She expresses impulsivity.    Review of Systems  Musculoskeletal: Positive for joint pain.  Neurological: Negative.   Psychiatric/Behavioral: Positive for hallucinations. The patient has insomnia.   All other systems reviewed and are negative.   Blood pressure  132/77,  pulse 85, temperature 98.8 F (37.1 C), temperature source Oral, resp. rate 19, height 5\' 2"  (1.575 m), weight 87.1 kg, SpO2 98 %.Body mass index is 35.12 kg/m.  General Appearance: Casual  Eye Contact:  Good  Speech:  Clear and Coherent and Pressured  Volume:  Increased  Mood:  Euphoric  Affect:  Congruent  Thought Process:  Goal Directed and Descriptions of Associations: Intact  Orientation:  Full (Time, Place, and Person)  Thought Content:  Delusions  Suicidal Thoughts:  No  Homicidal Thoughts:  No  Memory:  Immediate;   Fair Recent;   Fair Remote;   Fair  Judgement:  Impaired  Insight:  Shallow  Psychomotor Activity:  Increased  Concentration:  Concentration: Fair and Attention Span: Fair  Recall:  [employer] of Knowledge:  Fair  Language:  Fair  Akathisia:  No  Handed:  Right  AIMS (if indicated):     Assets:  Communication Skills Desire for Improvement Financial Resources/Insurance Housing Physical Health Resilience Social Support  ADL's:  Intact  Cognition:  WNL  Sleep:         COGNITIVE FEATURES THAT CONTRIBUTE TO RISK:  None    SUICIDE RISK:   Minimal: No identifiable suicidal ideation.  Patients presenting with no risk factors but with morbid ruminations; may be classified as minimal risk based on the severity of the depressive symptoms  PLAN OF CARE: hospital admission, medication management, discharge planning.  Ms. Delio is a 67 year old female with a history of bipolar disorder admitted for a manic episode in the context of medication changes. She had severe insomnia and was given 30 mg of Restoril tonight causinh hypotonia and was briefly hospitalized at ICU.  #Bipolar disorder, hypomania -continue Invega sustenna injection 234 mg every 28 days, next dose on 9/3 -continue Seroquel 300 mg nightlyfor mood stabilization, anxiety and depression -continue Depakote taper 250 mg BID now, due to thrombocytopenia -continue Lamictal 50 mg  BID  #Thrombocytopenia -Patient is being tapered off of Depakoteand started on Lamictal  #Insomnia -Melatonin 10 mg nightly  #Sleep apnea -CPAP nightly  #Hypothyroidism -Continue Synthroid 50 mcg p.o. daily  #HTN -Continue metoprolol 12.5 mg p.o. twice daily and Cozaar 50 mg p.o. daily  #DM -Tradjenta 5 mg daily -SSI, CBG, ADA diet  #Constipation -bowel regimen  #Dislipidemia  -Zocor 20 mg daily  #Labs -Hemoglobin A1c was 6.6 and total cholesterol was 140  #Disposition -discharge to home with the sister -follow up with [facility]    I certify that inpatient services furnished can reasonably be expected to improve the patient's condition.   Orson Slick, MD 03/16/2018, 6:40 PM

## 2018-03-16 NOTE — Progress Notes (Signed)
D:Pt denies SI/HI/AVH. Pt is pleasant and cooperativeduring assessments. Pt. Presents with heightened flights of ideas and/or tangential speech still this evening. Pt. Frequently laughs inappropriately. Pt. Is more euphoric and animated this evening. Pt. Frequently out of her room around the unit socializing and participating well. Pt. Attends snacks. Pt. Has trouble giving numbers or answers for pain scaling and other assessment questions. Pt. Is poor historian. Pt. Denies anxieties or depression. Pt. is religiously preoccupied with this Probation officer.  A: Q x 15 minute observation checks were completed for safety. Patient was provided with education, but needs reinforcement.Patient was given/offered medications per orders. Patient was encourage to attend groups, participate in unit activities and continue with plan of care. Pt. Chart and plans of care reviewed. Pt. Given support and encouragement.  R:Patient is complaint with medication and unit procedures. Pt. Pain being addressed with provider orders.Pt. Utilizes CPAP for sleep with no complications. Pt. Has history of chronic pain, but denies. Blood sugar monitored for AM check for safety.             Precautionary checks every 15 minutes for safety maintained, room free of safety hazards, patient sustains no injury or falls during this shift. Will endorse care to next shift.

## 2018-03-16 NOTE — Progress Notes (Signed)
Pt admitted to Stepdown overnight from behavioral unit d/t hypotension and lethargy. IV access was placed and patient given 2L IV NS boluses and NEO gtt started to maintain adequate BPs (refer to [facility]). Pt is more alert and awake this am and NEO gtt is being weaned off.

## 2018-03-16 NOTE — Discharge Summary (Signed)
[place] [facility] [place] NAME: Gina [name]    MR#:  [id number]  DATE OF BIRTH:  1951-06-25  DATE OF ADMISSION:  03/15/2018 ADMITTING PHYSICIAN: Lance Coon, MD  DATE OF DISCHARGE: 03/16/2018  PRIMARY CARE PHYSICIAN: Ria Bush, MD   ADMISSION DIAGNOSIS:  Hypotension Bipolar disorder Type 2 diabetes mellitus Obstructive sleep apnea Chronic kidney disease stage IV DISCHARGE DIAGNOSIS:  Principal Problem:   Hypotension Active Problems:   CKD (chronic kidney disease) stage 4, GFR 15-29 ml/min (HCC)   OSA on CPAP   Hyperlipidemia   Controlled type 2 diabetes mellitus with diabetic nephropathy (HCC)   Acquired hypothyroidism   Bipolar I disorder, current or most recent episode manic, with psychotic features ([place])   SECONDARY DIAGNOSIS:   Past Medical History:  Diagnosis Date  . Anemia in chronic kidney disease   . Bipolar depression [facility])    sees psychiatrist - psych admission 03/2015  . Bladder cancer ([place]) 1990's  . Blood transfusion without reported diagnosis 2009  . Cataract    left  . CKD (chronic kidney disease) stage 3, GFR 30-59 ml/min (HCC)   . Colon cancer ([place]) 1990's  . DJD (degenerative joint disease), lumbar    chronic lower back pain  . Enterocutaneous fistula 04/07/2012   completed PT 06/2012 ([facility])  . Family history of breast cancer   . Family history of colon cancer   . Family history of stomach cancer   . GERD (gastroesophageal reflux disease)   . History of bladder cancer 1997  . History of colon cancer    s/p surgery  . History of uterine cancer    s/p hysterectomy  . Hyperlipidemia   . Hypertension   . IDA (iron deficiency anemia) 01/2013   thought due to h/o polyps  . Obesity (BMI 30-39.9)   . OSA on CPAP    6cm H2O  . Personal history of colonic adenomas and colon cancer 11/11/2012  . Positive hepatitis C antibody test 09/2014   but negative confirmatory testing  . RBBB   .  Uncontrolled type 2 diabetes mellitus with nephropathy ([place]) 06/07/2014   completed DSME 02/2016     ADMITTING HISTORY Gina [name]  is a 67 y.o. female who presents with chief complaint as above.  Patient has been in behavioral health unit for several days being treated for primary psychiatric conditions.  Tonight she was started on Restoril at bedtime in addition to her other psych meds.  She had been on Depakote and Seroquel and Lamictal.  Her Depakote dose had recently been reduced.  However, after taking the Restoril tonight in addition to her Seroquel she became hypotensive.  She was transferred to stepdown unit and hospitalist were called for admission and treatment  HOSPITAL COURSE:  Was admitted to stepdown unit. She received IV fluids.  Patient blood pressure responded to IV fluids and improved.  Psychiatry consultation was done for bipolar disorder who recommended to continue all meds except Restoril. Patient's blood pressure has been stable.  Patient tolerating diet well.  Patient will be discharged back to behavioral unit.  CONSULTS OBTAINED:  Treatment Team:  Clovis Fredrickson, MD  DRUG ALLERGIES:   Allergies  Allergen Reactions  . Risperidone And Related Other (See Comments)    Reaction:  Altered mental status   . Penicillins Other (See Comments)    Pt states that this med knocks her out.  Has patient had a PCN reaction causing immediate rash, facial/tongue/throat swelling, SOB  or lightheadedness with hypotension Unsure  Has patient had a PCN reaction causing severe rash involving mucus membranes or skin necrosis Unsure  Has patient had a PCN reaction that required hospitalization Unsure  Has patient had a PCN reaction occurring within the last 10 years Unsure  If all of the above answers are "NO", then may proceed with Cephalosporin use.  Clementeen Hoof [Iodinated Diagnostic Agents] Rash  . Tetanus Toxoids Rash       . Zetia [Ezetimibe] Rash    DISCHARGE  MEDICATIONS:   Allergies as of 03/16/2018      Reactions   Risperidone And Related Other (See Comments)   Reaction:  Altered mental status    Penicillins Other (See Comments)   Pt states that this med knocks her out.  Has patient had a PCN reaction causing immediate rash, facial/tongue/throat swelling, SOB or lightheadedness with hypotension Unsure  Has patient had a PCN reaction causing severe rash involving mucus membranes or skin necrosis Unsure  Has patient had a PCN reaction that required hospitalization Unsure  Has patient had a PCN reaction occurring within the last 10 years Unsure  If all of the above answers are "NO", then may proceed with Cephalosporin use.   Ivp Dye [iodinated Diagnostic Agents] Rash   Tetanus Toxoids Rash      Zetia [ezetimibe] Rash      Medication List    TAKE these medications   ACCU-CHEK AVIVA PLUS test strip Generic drug:  glucose blood 1 each by Other route daily. E11.21   bisacodyl 10 MG suppository Commonly known as:  DULCOLAX Place 1 suppository (10 mg total) rectally daily as needed for moderate constipation.   diclofenac sodium 1 % Gel Commonly known as:  VOLTAREN Apply 4 g topically 4 (four) times daily. To knees   divalproex 500 MG DR tablet Commonly known as:  DEPAKOTE Take 1 tablet (500 mg total) by mouth 2 (two) times daily with a meal.   docusate sodium 100 MG capsule Commonly known as:  COLACE Take 2 capsules (200 mg total) by mouth 2 (two) times daily.   GAVILAX powder Generic drug:  polyethylene glycol powder TAKE 17 GRAMS BY MOUTH TWICE DAILY AS NEEDED FOR MODERATE CONSTIPATION.   INVEGA SUSTENNA 234 MG/1.5ML Susp injection Generic drug:  paliperidone Inject 234 mg into the muscle every 30 (thirty) days.   lamoTRIgine 25 MG tablet Commonly known as:  LAMICTAL Take 2 tablets (50 mg total) by mouth 2 (two) times daily.   levothyroxine 50 MCG tablet Commonly known as:  SYNTHROID, LEVOTHROID Take 1 tablet (50 mcg  total) by mouth daily before breakfast.   losartan 50 MG tablet Commonly known as:  COZAAR Take 1 tablet (50 mg total) by mouth daily.   MELATONIN PO Take 1 tablet by mouth at bedtime.   metoprolol tartrate 25 MG tablet Commonly known as:  LOPRESSOR Take 0.5 tablets (12.5 mg total) by mouth 2 (two) times daily.   QUEtiapine 300 MG tablet Commonly known as:  SEROQUEL Take 1 tablet (300 mg total) by mouth at bedtime.   senna 8.6 MG Tabs tablet Commonly known as:  SENOKOT Take 1 tablet (8.6 mg total) by mouth at bedtime as needed for mild constipation.   simvastatin 20 MG tablet Commonly known as:  ZOCOR Take 1 tablet (20 mg total) by mouth at bedtime.   sitaGLIPtin 25 MG tablet Commonly known as:  JANUVIA Take 1 tablet (25 mg total) by mouth daily.   triamcinolone cream 0.1 %  Commonly known as:  KENALOG Apply 1 application topically at bedtime. Apply to AA.       Today  Patient seen and evaluated today Blood pressure has been stable No fever Tolerating diet well  VITAL SIGNS:  Blood pressure 128/82, pulse 90, temperature (!) 97.5 F (36.4 C), temperature source Oral, resp. rate (!) 21, height 5' 2"  (1.575 m), weight 87.2 kg, SpO2 98 %.  I/O:    Intake/Output Summary (Last 24 hours) at 03/16/2018 1258 Last data filed at 03/16/2018 1100 Gross per 24 hour  Intake 1341.5 ml  Output 1600 ml  Net -258.5 ml    PHYSICAL EXAMINATION:  Physical Exam  GENERAL:  67 y.o.-year-old patient lying in the bed with no acute distress.  LUNGS: Normal breath sounds bilaterally, no wheezing, rales,rhonchi or crepitation. No use of accessory muscles of respiration.  CARDIOVASCULAR: S1, S2 normal. No murmurs, rubs, or gallops.  ABDOMEN: Soft, non-tender, non-distended. Bowel sounds present. No organomegaly or mass.  NEUROLOGIC: Moves all 4 extremities. PSYCHIATRIC: The patient is alert and oriented x 3.  SKIN: No obvious rash, lesion, or ulcer.   DATA REVIEW:   CBC Recent  Labs  Lab 03/16/18 0624  WBC 7.4  HGB 11.1*  HCT 31.7*  PLT 98*    Chemistries  Recent Labs  Lab 03/16/18 0029 03/16/18 0624  NA 137 139  K 5.0 4.6  CL 103 106  CO2 25 28  GLUCOSE 149* 115*  BUN 33* 29*  CREATININE 1.56* 1.41*  CALCIUM 8.4* 8.4*  AST 28  --   ALT 20  --   ALKPHOS 62  --   BILITOT 0.5  --     Cardiac Enzymes No results for input(s): TROPONINI in the last 168 hours.  Microbiology Results  Results for orders placed or performed during the hospital encounter of 03/15/18  MRSA PCR Screening     Status: Abnormal   Collection Time: 03/15/18 11:11 PM  Result Value Ref Range Status   MRSA by PCR POSITIVE (A) NEGATIVE Final    Comment:        The GeneXpert MRSA Assay (FDA approved for NASAL specimens only), is one component of a comprehensive MRSA colonization surveillance program. It is not intended to diagnose MRSA infection nor to guide or monitor treatment for MRSA infections. RESULT CALLED TO, READ BACK BY AND VERIFIED WITH: ALIYAH BARNES ON 03/13/18 AT 0112 BY JAG Performed at [facility], [address] [place], [place] [address]     RADIOLOGY:  No results found.  Follow up with PCP in 1 week.  Management plans discussed with the patient, family and they are in agreement.  CODE STATUS: Full code    Code Status Orders  (From admission, onward)         Start     Ordered   03/15/18 2331  Full code  Continuous     03/15/18 2331        Code Status History    Date Active Date Inactive Code Status Order ID Comments User Context   03/11/2018 2329 03/15/2018 2305 Full Code [id number]  Clovis Fredrickson, MD Inpatient   11/13/2017 2220 11/24/2017 1542 Full Code [id number]  [name], MD Inpatient   11/04/2017 1746 11/09/2017 1600 Full Code [id number]  [name], MD Inpatient   12/03/2015 2043 12/19/2015 1439 Full Code [id number]  [name], MD Inpatient   04/13/2012 1506 05/15/2012 1637 Full Code [id number]  Ermalinda Memos Inpatient  TOTAL TIME TAKING CARE OF THIS PATIENT ON DAY OF DISCHARGE: more than 34 minutes.   Saundra Shelling M.D on 03/16/2018 at 12:58 PM  Between 7am to 6pm - Pager - [phone]  After 6pm go to [email or web address] - password [email or web address] [facility]  Office  [phone]  CC: Primary care physician; Ria Bush, MD  Note: This dictation was prepared with Dragon dictation along with smaller phrase technology. Any transcriptional errors that result from this process are unintentional.

## 2018-03-16 NOTE — Progress Notes (Signed)
Patient ID: Gina [name], female   DOB: Oct 18, 1950, 67 y.o.   MRN: [id number] PER STATE REGULATIONS 482.30  THIS CHART WAS REVIEWED FOR MEDICAL NECESSITY WITH RESPECT TO THE PATIENT'S ADMISSION/DURATION OF STAY.  NEXT REVIEW DATE:03/20/18  Roma Schanz, RN, BSN CASE MANAGER

## 2018-03-16 NOTE — Progress Notes (Signed)
Admission Note:   Report was received from [place], [place] at 6247 on a 67 year old female who presents Voluntary in no acute distress for the treatment of Depression. Patient was calm and cooperative with admission process. Patient denies SI/HI/AVH stating to this writer "I can't take the heat". Patient also denies any signs/symptoms of anxiety, but states that she is depressed because "I haven't heard from my sister". Patient's goals while here is "getting well, I feel like a yo-yo". Patient has a past medical history of Depression and DM without complication along with an extensive history of other medical issues. Her CBG was 112 upon admission. Skin was assessed with Meredith Mody, RN and found to be clear of any abnormal marks apart from a surgical scar midline down her abdomen and an abdominal hernia. Patient searched and no contraband found and unit policies explained and understanding verbalized. Consents obtained. Food and fluids offered, and fluids accepted. Patient had no additional questions or concerns.

## 2018-03-16 NOTE — Progress Notes (Signed)
Chaplain received an OR for prayer. Upon arrival to patient's room, patient was awake, alert, and watching the news. She was welcoming and invited the chaplain in to talk. Patient shared what brought her to the ED, but she is unsure how she got to ICU after having been in [employer]. She indicates that she is feeling somewhat better, but that she is being patient with the process of recovery. Patient is highly religious and delighted in sharing her [place] in conversation. We discussed the saints, recited the Lord's Prayer together, and talked about how God has shown God's grace in her life to date. Chaplain and patient prayed for her health, strength, recovery. Patient added prayers for the children who are suffering, this country, and this world. Patient encouraged to request nurse to page as needed.    03/16/18 1300  Clinical Encounter Type  Visited With Patient  Visit Type Initial;Spiritual support  Spiritual Encounters  Spiritual Needs Prayer

## 2018-03-16 NOTE — Consult Note (Signed)
[name] was transferred to ICU for treatment of hypotension.   Please transfer patient back to psychiatry when medically clear.

## 2018-03-17 LAB — GLUCOSE, CAPILLARY
GLUCOSE-CAPILLARY: 85 mg/dL (ref 70–99)
Glucose-Capillary: 131 mg/dL — ABNORMAL HIGH (ref 70–99)

## 2018-03-17 MED ORDER — MAGNESIUM CITRATE PO SOLN
1.0000 | Freq: Once | ORAL | Status: AC
  Administered 2018-03-17: 1 via ORAL
  Filled 2018-03-17: qty 296

## 2018-03-17 NOTE — BHH Suicide Risk Assessment (Signed)
[place] INPATIENT:  Family/Significant Other Suicide Prevention Education  Suicide Prevention Education:  Education Completed; Devoria Glassing, sister,  (name of family member/significant other) has been identified by the patient as the family member/significant other with whom the patient will be residing, and identified as the person(s) who will aid the patient in the event of a mental health crisis (suicidal ideations/suicide attempt).  With written consent from the patient, the family member/significant other has been provided the following suicide prevention education, prior to the and/or following the discharge of the patient.  The suicide prevention education provided includes the following:  Suicide risk factors  Suicide prevention and interventions  National Suicide Hotline telephone number  [facility] assessment telephone number  [facility] Emergency Assistance [place] and/or Residential Mobile Crisis Unit telephone number  Request made of family/significant other to:  Remove weapons (e.g., guns, rifles, knives), all items previously/currently identified as safety concern.    Remove drugs/medications (over-the-counter, prescriptions, illicit drugs), all items previously/currently identified as a safety concern.  The family member/significant other verbalizes understanding of the suicide prevention education information provided.  The family member/significant other agrees to remove the items of safety concern listed above.  Carloyn Jaeger Desteni Piscopo, LCSW 03/17/2018, 3:16 PM

## 2018-03-17 NOTE — Progress Notes (Signed)
Recreation Therapy Notes  Date: 03/17/2018  Time: 9:30 am  Location: Craft Room  Behavioral response: Appropriate    Intervention Topic: Happiness  Discussion/Intervention:  Group content today was focused on Happiness. The group defined happiness and stated reasons they are and are not happy at times. Participants identified reasons they are normally happy and why. Individuals expressed how not being happy affects themselves and others. Patients stated reasons why happiness is important to them. The group described how they feel when they are happy. Individuals participated in the intervention "What is happiness" where they defined what happiness means to them.  Clinical Observations/Feedback:  Patient came to group and defined being happy as thinking of funny things. She stated the [place]no[employer] carton makes her happy. She identified the death of her father as something that made her unhappy. Patient was social with peers and staff while participating in group.  Taneia Mealor LRT/CTRS         Kaleya Douse 03/17/2018 1:18 PM

## 2018-03-17 NOTE — Plan of Care (Signed)
Patient is alert to self and situation. Patient is disoriented to time and place at  times and must be redirected to room and unit. Patient denies SI, HI and AVH. Patient remains very vocal in groups but redirectable. Patient has spells in which she will burst out  laughter when no one is talking to her but denies hearing voices. Today patient asked if she could stay her room most of the day. Patient educated on participation and communing with peers for positive treatment results. Patient verbalized understanding. Patient is very knowledgeable of medications. Patient presents with no self injurious behaviors. Patient does complain of constipation for several days. Nurse will continue to monitor. Problem: Education: Goal: Knowledge of [facility] Education information/materials will improve Outcome: Progressing Goal: Emotional status will improve Outcome: Progressing Goal: Mental status will improve Outcome: Progressing Goal: Verbalization of understanding the information provided will improve Outcome: Progressing   Problem: Activity: Goal: Interest or engagement in activities will improve Outcome: Progressing Goal: Sleeping patterns will improve Outcome: Progressing

## 2018-03-17 NOTE — Progress Notes (Signed)
Cpap on standby at this time, RN placed into treatment room until tonight.

## 2018-03-17 NOTE — Progress Notes (Signed)
[facility] MD Progress Note  03/18/2018 1:47 PM Gina [name]  MRN:  [id number]  Subjective:    Gina [name] was constipated yesterday but now suffers diarrhea from laxatives. She is rather miserable in her room. She has hemorhoids. She is unable to walk to the dining area and is not allowed to have a side table in her room or a bedside commode. She feels that we are not taking good care of her.  She is still religiously preoccupied, talkative and intrusive. Tremors of both hands. No fever but sore throat and hoarse voice.   Received information from her ACT team with exact schedule for transition from Depakote to Lamictal.  Principal Problem: Bipolar I disorder, current or most recent episode manic, with psychotic features [facility]) Diagnosis:   Patient Active Problem List   Diagnosis Date Noted  . Bipolar I disorder, current or most recent episode manic, with psychotic features ([place]) [F31.2] 11/04/2017    Priority: High  . Altered mental status, unspecified [R41.82] 03/16/2018  . Hypotension [I95.9] 03/15/2018  . Rash of hands [R21] 02/18/2018  . Pain of right heel [M79.671] 12/30/2017  . Encounter for chronic pain management [G89.29] 12/18/2017  . Foot pain, bilateral [id number], M79.672] 11/25/2017  . Gallbladder polyp [K82.4] 10/21/2017  . Left shoulder pain [M25.512] 10/21/2017  . Benign neoplasm of ascending colon [D12.2]   . Low back pain radiating to left lower extremity [M54.5, M79.605] 02/26/2017  . Acquired hypothyroidism [E03.9] 09/10/2016  . Thrombocytopenia ([place]) [D69.6] 06/15/2016  . Medicare annual wellness visit, subsequent [Z00.00] 06/04/2016  . Advanced care planning/counseling discussion [Z71.89] 06/04/2016  . External hemorrhoid [K64.4] 04/02/2016  . Slow transit constipation [K59.01] 01/04/2016  . Bipolar I disorder, most recent episode (or current) manic ([place]) [F31.10] 12/03/2015  . NAFLD (nonalcoholic fatty liver disease) [K76.0] 03/28/2015  . Gallbladder  calculus without cholecystitis [K80.20] 03/28/2015  . Gout [M10.9] 03/27/2015  . HCV antibody positive [R76.8] 03/27/2015  . History of bladder cancer [Z85.51] 03/27/2015  . History of bundle branch block [Z86.79] 03/27/2015  . Genetic testing [Z13.79] 02/28/2015  . Loss of memory [R41.3] 09/04/2014  . Controlled type 2 diabetes mellitus with diabetic nephropathy ([place]) [E11.21] 06/07/2014  . Hyperlipidemia [E78.5]   . Anemia in CKD (chronic kidney disease) [N18.9, D63.1] 01/26/2013  . Personal history of colonic adenomas and colon cancer [Z86.010] 11/11/2012  . Osteoarthritis of knee [M17.10] 10/02/2012  . OSA on CPAP [G47.33, Z99.89]   . CKD (chronic kidney disease) stage 4, GFR 15-29 ml/min (HCC) [N18.4]   . Severe obesity (BMI 35.0-39.9) with comorbidity ([place]) [E66.01]   . HTN (hypertension), benign [I10] 04/07/2012  . Hx of colon cancer, stage I [Z85.038] 08/05/2011   Total Time spent with patient: 20 minutes  Past Psychiatric History: bipolar disorder   Past Medical History:  Past Medical History:  Diagnosis Date  . Anemia in chronic kidney disease   . Bipolar depression [facility])    sees psychiatrist - psych admission 03/2015  . Bladder cancer ([place]) 1990's  . Blood transfusion without reported diagnosis 2009  . Cataract    left  . CKD (chronic kidney disease) stage 3, GFR 30-59 ml/min (HCC)   . Colon cancer ([place]) 1990's  . DJD (degenerative joint disease), lumbar    chronic lower back pain  . Enterocutaneous fistula 04/07/2012   completed PT 06/2012 ([facility])  . Family history of breast cancer   . Family history of colon cancer   . Family history of stomach cancer   . GERD (  gastroesophageal reflux disease)   . History of bladder cancer 1997  . History of colon cancer    s/p surgery  . History of uterine cancer    s/p hysterectomy  . Hyperlipidemia   . Hypertension   . IDA (iron deficiency anemia) 01/2013   thought due to h/o polyps  . Obesity (BMI 30-39.9)   . OSA  on CPAP    6cm H2O  . Personal history of colonic adenomas and colon cancer 11/11/2012  . Positive hepatitis C antibody test 09/2014   but negative confirmatory testing  . RBBB   . Uncontrolled type 2 diabetes mellitus with nephropathy ([place]) 06/07/2014   completed DSME 02/2016    Past Surgical History:  Procedure Laterality Date  . BREAST BIOPSY Right 01/2014   fibroadenoma  . COLONOSCOPY  07/2009  . COLONOSCOPY  11/2012   2 tubular adenomas, mild diverticulosis, pending genetic testing for Lynch syndrome [name]) rpt 2 yrs  . COLONOSCOPY  02/2015   TA, diverticulosis, rpt 2 yrs [name])  . COLONOSCOPY WITH PROPOFOL N/A 06/09/2017   TAx1, rpt 2 yrs [name], [name], MD)  . DEXA  12/2009   WNL  . DOBUTAMINE STRESS ECHO  12/2009   no evidence of ischemia  . HERNIA REPAIR  02/04/12  . i & d abdominal wound  02/19/12  . LEFT OOPHORECTOMY  2005  . PARTIAL COLECTOMY  about 2008   for colon cancer  . PARTIAL HYSTERECTOMY  1981   uterine cancer, R ovary remains  . Reexploration of abdominal wound and Allograft placemet  02/26/12  . Removal of infected mesh and abdominal wound vac placement  02/24/12  . sleep study  02/2014   OSA - AHI 55, nadir 81% [name])   Family History:  Family History  Problem Relation Age of Onset  . Colon cancer Mother 21  . Stomach cancer Mother        dx in her 76s?  . CAD Father        MI; deceased 11  . Mental illness Sister        anxiety/depression  . Thyroid disease Sister   . Breast cancer Maternal Grandmother        age at dx unknown  . Diabetes Brother   . Diabetes Brother   . Diabetes Other        aunts and uncles both sides  . Arthritis Other        strong fmhx  . Colon cancer Other 39       maternal half-sister; deceased   Family Psychiatric  History:  Sister with intellectual disability Social History:  Social History   Substance and Sexual Activity  Alcohol Use No  . Alcohol/week: 0.0 standard drinks     Social History   Substance  and Sexual Activity  Drug Use No    Social History   Socioeconomic History  . Marital status: Single    Spouse name: Not on file  . Number of children: 0  . Years of education: Not on file  . Highest education level: Not on file  Occupational History  . Not on file  Social Needs  . Financial resource strain: Not on file  . Food insecurity:    Worry: Not on file    Inability: Not on file  . Transportation needs:    Medical: Not on file    Non-medical: Not on file  Tobacco Use  . Smoking status: Never Smoker  . Smokeless tobacco: Never Used  Substance and Sexual Activity  . Alcohol use: No    Alcohol/week: 0.0 standard drinks  . Drug use: No  . Sexual activity: Never  Lifestyle  . Physical activity:    Days per week: Not on file    Minutes per session: Not on file  . Stress: Not on file  Relationships  . Social connections:    Talks on phone: Not on file    Gets together: Not on file    Attends religious service: Not on file    Active member of club or organization: Not on file    Attends meetings of clubs or organizations: Not on file    Relationship status: Not on file  Other Topics Concern  . Not on file  Social History Narrative   Lives with sister, no pets   Occupation: disabled, for bipolar and arthritis   Edu: GED   Activity: take walks   Diet: good water, vegetables daily   Religion: [place], Dr. Jimmye Norman (ph [phone])   Additional Social History:                         Sleep: Poor  Appetite:  Fair  Current Medications: Current Facility-Administered Medications  Medication Dose Route Frequency Provider Last Rate Last Dose  . acetaminophen (TYLENOL) tablet 650 mg  650 mg Oral Q6H PRN Amena Dockham B, MD      . alum & mag hydroxide-simeth (MAALOX/MYLANTA) 200-200-20 MG/5ML suspension 30 mL  30 mL Oral Q4H PRN Parmvir Boomer B, MD      . bisacodyl (DULCOLAX) EC tablet 5 mg  5 mg Oral  Daily PRN Andreas Sobolewski B, MD   5 mg at 03/18/18 0333  . divalproex (DEPAKOTE) DR tablet 250 mg  250 mg Oral Q12H Derric Dealmeida B, MD   250 mg at 03/18/18 0820  . docusate sodium (COLACE) capsule 100 mg  100 mg Oral BID Jullisa Grigoryan B, MD   100 mg at 03/18/18 0820  . lamoTRIgine (LAMICTAL) tablet 50 mg  50 mg Oral BID Abrian Hanover B, MD   50 mg at 03/18/18 0821  . levothyroxine (SYNTHROID, LEVOTHROID) tablet 50 mcg  50 mcg Oral QAC breakfast Brennyn Ortlieb B, MD   50 mcg at 03/18/18 0821  . linagliptin (TRADJENTA) tablet 5 mg  5 mg Oral Daily Reneisha Stilley B, MD   5 mg at 03/18/18 0820  . losartan (COZAAR) tablet 50 mg  50 mg Oral Daily Giovoni Bunch B, MD   50 mg at 03/18/18 0820  . magnesium hydroxide (MILK OF MAGNESIA) suspension 30 mL  30 mL Oral Daily PRN Dealva Lafoy B, MD   30 mL at 03/18/18 0333  . metoprolol tartrate (LOPRESSOR) tablet 12.5 mg  12.5 mg Oral BID Samiya Mervin B, MD   12.5 mg at 03/18/18 0820  . [START ON 04/06/2018] paliperidone (INVEGA SUSTENNA) injection 234 mg  234 mg Intramuscular Q28 days Delbert Vu B, MD      . polyethylene glycol (MIRALAX / GLYCOLAX) packet 17 g  17 g Oral Daily Lara Palinkas B, MD   17 g at 03/18/18 1497  . QUEtiapine (SEROQUEL) tablet 300 mg  300 mg Oral QHS Latora Quarry B, MD   300 mg at 03/17/18 2116  . simethicone (MYLICON) chewable tablet 80 mg  80 mg Oral QID Felipe Paluch B, MD   80 mg at 03/18/18 0821  .  simvastatin (ZOCOR) tablet 20 mg  20 mg Oral q1800 Costas Sena B, MD   20 mg at 03/17/18 1751    Lab Results:  Results for orders placed or performed during the hospital encounter of 03/16/18 (from the past 48 hour(s))  Glucose, capillary     Status: Abnormal   Collection Time: 03/16/18  3:54 PM  Result Value Ref Range   Glucose-Capillary 112 (H) 70 - 99 mg/dL  Glucose, capillary     Status: Abnormal   Collection Time: 03/17/18  7:02 AM  Result  Value Ref Range   Glucose-Capillary 131 (H) 70 - 99 mg/dL   Comment 1 Notify RN   Glucose, capillary     Status: None   Collection Time: 03/17/18  9:14 PM  Result Value Ref Range   Glucose-Capillary 85 70 - 99 mg/dL   Comment 1 Notify RN   Glucose, capillary     Status: None   Collection Time: 03/18/18  7:04 AM  Result Value Ref Range   Glucose-Capillary 84 70 - 99 mg/dL   Comment 1 Notify RN     Blood Alcohol level:  Lab Results  Component Value Date   ETH <10 03/11/2018   ETH <10 10/93/2355    Metabolic Disorder Labs: Lab Results  Component Value Date   HGBA1C 6.6 (H) 02/18/2018   MPG 122.63 11/14/2017   MPG 114 04/14/2012   Lab Results  Component Value Date   PROLACTIN 33.6 (H) 12/04/2015   Lab Results  Component Value Date   CHOL 140 11/14/2017   TRIG 152 (H) 11/14/2017   HDL 50 11/14/2017   CHOLHDL 2.8 11/14/2017   VLDL 30 11/14/2017   LDLCALC 60 11/14/2017   LDLCALC 81 09/30/2017    Physical Findings: AIMS:  , ,  ,  ,    CIWA:    COWS:     Musculoskeletal: Strength & Muscle Tone: within normal limits Gait & Station: normal Patient leans: N/A  Psychiatric Specialty Exam: Physical Exam  Nursing note and vitals reviewed. Psychiatric: Her affect is labile and inappropriate. Her speech is rapid and/or pressured. She is hyperactive. Thought content is delusional. Cognition and memory are normal. She expresses impulsivity.    Review of Systems  Neurological: Negative.   Psychiatric/Behavioral: The patient has insomnia.   All other systems reviewed and are negative.   Blood pressure 111/76, pulse 98, temperature 98 F (36.7 C), temperature source Oral, resp. rate 18, height 5\' 2"  (1.575 m), weight 87.1 kg, SpO2 98 %.Body mass index is 35.12 kg/m.  General Appearance: Casual  Eye Contact:  Good  Speech:  Pressured  Volume:  Increased  Mood:  Euphoric  Affect:  Inappropriate and Labile  Thought Process:  Irrelevant and Descriptions of Associations:  Tangential  Orientation:  Full (Time, Place, and Person)  Thought Content:  Delusions  Suicidal Thoughts:  No  Homicidal Thoughts:  No  Memory:  Immediate;   Fair Recent;   Fair Remote;   Fair  Judgement:  Impaired  Insight:  Shallow  Psychomotor Activity:  Increased  Concentration:  Concentration: Fair and Attention Span: Fair  Recall:  [employer] of Knowledge:  Fair  Language:  Fair  Akathisia:  No  Handed:  Right  AIMS (if indicated):     Assets:  Communication Skills Desire for Improvement Financial Resources/Insurance Housing Physical Health Resilience Social Support  ADL's:  Intact  Cognition:  WNL  Sleep:  Number of Hours: 4.45     Treatment Plan  Summary: Daily contact with patient to assess and evaluate symptoms and progress in treatment and Medication management   Ms. Bonneau is a 67 year old female with a history of bipolar disorder admitted for a manic episode in the context of medication changes.She had severe insomnia and was given 30 mg of Restoril tonight causing hypotonia and was briefly hospitalized at ICU. She is still hypomanic  #Bipolar disorder, hypomania -continue Invega sustenna injection 234 mg every 28 days, next dose on 9/3 -continue Seroquel 300 mg nightlyfor mood stabilization, anxiety and depression -continue Depakote taper 250 mg BID now, due to thrombocytopenia -continue Lamictal 50 mg BID  #Thrombocytopenia -Patient is being tapered off of Depakoteand started on Lamictal  #Insomnia -Melatonin 10 mg nightly  #Sleep apnea -CPAP nightly  #Hypothyroidism -Continue Synthroid 50 mcg p.o. daily  #HTN -Continue metoprolol 12.5 mg p.o. twice daily and Cozaar 50 mg p.o. daily  #DM -Tradjenta 5 mg daily -SSI, CBG, ADA diet  #Constipation -bowel regimen -Anusol PRN  #Dislipidemia  -Zocor 20 mg daily  #Labs -Hemoglobin A1c was 6.6 and total cholesterol was 140  #Disposition -discharge to home with the  sister -follow up with [facility]  Orson Slick, MD 03/18/2018, 1:47 PM

## 2018-03-17 NOTE — [facility] Counselor (Signed)
Adult Comprehensive Assessment  Patient ID: Gina [name], female   DOB: [birth year], 67 y.o.   MRN: [id number]  Information Source: Information source: Patient  Current Stressors:  Patient states their primary concerns and needs for treatment are:: "I need to get my medicince right.  I don't think the Depakote is right for me." Patient states their goals for this hospitilization and ongoing recovery are:: "Get my medicine right" Educational / Learning stressors: None noted Employment / Job issues: Pt does not work.  She receives disability Programmer, applications / Lack of resources (include bankruptcy): No issues noted Housing / Lack of housing: Housing is stable Physical health (include injuries & life threatening diseases): Pt has Arthritis and has to have surgery on her leg.  Pt has had cancer in the past. Social relationships: Pt does not go out the house much.  "I stay home with my sister" Substance abuse: Pt denies Bereavement / Loss: Nothing noted  Living/Environment/Situation:  Living Arrangements: Other relatives(sister) Living conditions (as described by patient or guardian): "good" Who else lives in the home?: Pt's sister How long has patient lived in current situation?: 6 years What is atmosphere in current home: Comfortable, Quarry manager, Supportive  Family History:  Marital status: Single Are you sexually active?: No What is your sexual orientation?: Heterosexual Has your sexual activity been affected by drugs, alcohol, medication, or emotional stress?: No Does patient have children?: No  Childhood History:  By whom was/is the patient raised?: Both parents Additional childhood history information: "We are [place] so family is everything." Description of patient's relationship with caregiver when they were a child: "I had great relationships with my parents" Patient's description of current relationship with people who raised him/her: Both of pt's parents are  deceased How were you disciplined when you got in trouble as a child/adolescent?: "My mom would put me in the hospital" Does patient have siblings?: Yes Number of Siblings: 3 Description of patient's current relationship with siblings: Pt has 1 sister and 2 brothers.  She lives with her sister and they are very close.  She has one brother who lives in [place], [place] and the other lives in [place].  She talks to her brothers on occasion.   Did patient suffer any verbal/emotional/physical/sexual abuse as a child?: No Did patient suffer from severe childhood neglect?: No Has patient ever been sexually abused/assaulted/raped as an adolescent or adult?: No Was the patient ever a victim of a crime or a disaster?: No Witnessed domestic violence?: No Has patient been effected by domestic violence as an adult?: No  Education:  Highest grade of school patient has completed: GED Currently a Ship broker?: No Learning disability?: No  Employment/Work Situation:   Employment situation: On disability Why is patient on disability: n/a How long has patient been on disability: n/a Patient's job has been impacted by current illness: No What is the longest time patient has a held a job?: 30+ years Where was the patient employed at that time?: In a [employer] in [place] Did You Receive Any Psychiatric Treatment/Services While in the [employer]?: No Are There Guns or Other Weapons in [place]?: No Are These Psychologist, educational?: (N/A)  Financial Resources:   Financial resources: Medicare Does patient have a Programmer, applications or guardian?: No  Alcohol/Substance Abuse:   What has been your use of drugs/alcohol within the last 12 months?: Pt denies If attempted suicide, did drugs/alcohol play a role in this?: No Alcohol/Substance Abuse Treatment Hx: Denies past history If yes, describe treatment:  N/A Has alcohol/substance abuse ever caused legal problems?: No  Social Support System:   Patient's  Community Support System: Poor Describe Community Support System: Pt does not go outside her home except for appointments.  She only interacts with her sister and professional staff members Type of faith/religion: Catholic How does patient's faith help to cope with current illness?: "I pray, do rosary, read Bible cards"  Leisure/Recreation:   Leisure and Hobbies: "Read the Bible and do word search puzzles"  Strengths/Needs:   What is the patient's perception of their strengths?: "I"m not really sure" Patient states they can use these personal strengths during their treatment to contribute to their recovery: "I'm not sure" Patient states these barriers may affect/interfere with their treatment: "Not getting on the right medication. I don't have anyone to help me except for my ACT Team Nurse" Patient states these barriers may affect their return to the community: Nothing stated Other important information patient would like considered in planning for their treatment: Nothing stated  Discharge Plan:   Currently receiving community mental health services: Yes (From Whom)([facility]T [facility]ih [facility]) Patient states concerns and preferences for aftercare planning are: Nothing stated Patient states they will know when they are safe and ready for discharge when: "My medication is right" Does patient have access to transportation?: Yes Does patient have financial barriers related to discharge medications?: No Patient description of barriers related to discharge medications: Nothing noted Will patient be returning to same living situation after discharge?: Yes  Summary/Recommendations:   Summary and Recommendations (to be completed by the evaluator): Re-admit after short stay on medical floor to address problem with low platelets.  Pt is a 67 yo female with a hx of Bipolar Disorder, multiple hospitalizations, and medication trials.  Pt was brought to the ED by her ACTT due to manic behavior which included  restlessness, not sleeping for days, calling her PCP several times, and talking to God.  Pt was disorganized in thought and claimed that she did not stay in the hospital long enough the last time she was in the BMU in April 2019.  Pt stated that the Depakote medication that she is on is not right for her.  No psychosis or SA.  Recommendations for pt include crisis stabilization, medication management, therapeutic milieu, encouragement of attendance and participation in groups, and development of a comprehensive wellness plan.  Discharge plan is for pt to return home and resume ACT services with [facility].  [email or web address].LCSW 03/17/2018

## 2018-03-17 NOTE — Progress Notes (Signed)
[facility] MD Progress Note  03/17/2018 2:31 PM Scheryl Sanborn  MRN:  [id number]  Subjective:    Gina [name] met with treatment team this morning. She is still hypomanic, loud and intrusive. She is tangential, religiously preoccupied and exuberant. She shows good sense of humor. Her voice is hoarse and she complains of cold symptoms. No fever. She has also been constipated.  We continue cross taper of depakote for Lamictal. This was interrupted by ICU admission. We will see if Lamictal can control her symptoms adequately.  Principal Problem: Bipolar I disorder, current or most recent episode manic, with psychotic features ([place]) Diagnosis:   Patient Active Problem List   Diagnosis Date Noted  . Bipolar I disorder, current or most recent episode manic, with psychotic features ([place]) [F31.2] 11/04/2017    Priority: High  . Altered mental status, unspecified [R41.82] 03/16/2018  . Hypotension [I95.9] 03/15/2018  . Rash of hands [R21] 02/18/2018  . Pain of right heel [M79.671] 12/30/2017  . Encounter for chronic pain management [G89.29] 12/18/2017  . Foot pain, bilateral [id number], M79.672] 11/25/2017  . Gallbladder polyp [K82.4] 10/21/2017  . Left shoulder pain [M25.512] 10/21/2017  . Benign neoplasm of ascending colon [D12.2]   . Low back pain radiating to left lower extremity [M54.5, M79.605] 02/26/2017  . Acquired hypothyroidism [E03.9] 09/10/2016  . Thrombocytopenia ([place]) [D69.6] 06/15/2016  . Medicare annual wellness visit, subsequent [Z00.00] 06/04/2016  . Advanced care planning/counseling discussion [Z71.89] 06/04/2016  . External hemorrhoid [K64.4] 04/02/2016  . Slow transit constipation [K59.01] 01/04/2016  . Bipolar I disorder, most recent episode (or current) manic ([place]) [F31.10] 12/03/2015  . NAFLD (nonalcoholic fatty liver disease) [K76.0] 03/28/2015  . Gallbladder calculus without cholecystitis [K80.20] 03/28/2015  . Gout [M10.9] 03/27/2015  . HCV antibody positive  [R76.8] 03/27/2015  . History of bladder cancer [Z85.51] 03/27/2015  . History of bundle branch block [Z86.79] 03/27/2015  . Genetic testing [Z13.79] 02/28/2015  . Loss of memory [R41.3] 09/04/2014  . Controlled type 2 diabetes mellitus with diabetic nephropathy ([place]) [E11.21] 06/07/2014  . Hyperlipidemia [E78.5]   . Anemia in CKD (chronic kidney disease) [N18.9, D63.1] 01/26/2013  . Personal history of colonic adenomas and colon cancer [Z86.010] 11/11/2012  . Osteoarthritis of knee [M17.10] 10/02/2012  . OSA on CPAP [G47.33, Z99.89]   . CKD (chronic kidney disease) stage 4, GFR 15-29 ml/min (HCC) [N18.4]   . Severe obesity (BMI 35.0-39.9) with comorbidity ([place]) [E66.01]   . HTN (hypertension), benign [I10] 04/07/2012  . Hx of colon cancer, stage I [Z85.038] 08/05/2011   Total Time spent with patient: 20 minutes  Past Psychiatric History: bipolar disorder  Past Medical History:  Past Medical History:  Diagnosis Date  . Anemia in chronic kidney disease   . Bipolar depression [facility])    sees psychiatrist - psych admission 03/2015  . Bladder cancer ([place]) 1990's  . Blood transfusion without reported diagnosis 2009  . Cataract    left  . CKD (chronic kidney disease) stage 3, GFR 30-59 ml/min (HCC)   . Colon cancer ([place]) 1990's  . DJD (degenerative joint disease), lumbar    chronic lower back pain  . Enterocutaneous fistula 04/07/2012   completed PT 06/2012 ([facility])  . Family history of breast cancer   . Family history of colon cancer   . Family history of stomach cancer   . GERD (gastroesophageal reflux disease)   . History of bladder cancer 1997  . History of colon cancer    s/p surgery  . History of  uterine cancer    s/p hysterectomy  . Hyperlipidemia   . Hypertension   . IDA (iron deficiency anemia) 01/2013   thought due to h/o polyps  . Obesity (BMI 30-39.9)   . OSA on CPAP    6cm H2O  . Personal history of colonic adenomas and colon cancer 11/11/2012  . Positive  hepatitis C antibody test 09/2014   but negative confirmatory testing  . RBBB   . Uncontrolled type 2 diabetes mellitus with nephropathy ([place]) 06/07/2014   completed DSME 02/2016    Past Surgical History:  Procedure Laterality Date  . BREAST BIOPSY Right 01/2014   fibroadenoma  . COLONOSCOPY  07/2009  . COLONOSCOPY  11/2012   2 tubular adenomas, mild diverticulosis, pending genetic testing for Lynch syndrome [name]) rpt 2 yrs  . COLONOSCOPY  02/2015   TA, diverticulosis, rpt 2 yrs [name])  . COLONOSCOPY WITH PROPOFOL N/A 06/09/2017   TAx1, rpt 2 yrs [name], [name], MD)  . DEXA  12/2009   WNL  . DOBUTAMINE STRESS ECHO  12/2009   no evidence of ischemia  . HERNIA REPAIR  02/04/12  . i & d abdominal wound  02/19/12  . LEFT OOPHORECTOMY  2005  . PARTIAL COLECTOMY  about 2008   for colon cancer  . PARTIAL HYSTERECTOMY  1981   uterine cancer, R ovary remains  . Reexploration of abdominal wound and Allograft placemet  02/26/12  . Removal of infected mesh and abdominal wound vac placement  02/24/12  . sleep study  02/2014   OSA - AHI 55, nadir 81% [name])   Family History:  Family History  Problem Relation Age of Onset  . Colon cancer Mother 81  . Stomach cancer Mother        dx in her 63s?  . CAD Father        MI; deceased 46  . Mental illness Sister        anxiety/depression  . Thyroid disease Sister   . Breast cancer Maternal Grandmother        age at dx unknown  . Diabetes Brother   . Diabetes Brother   . Diabetes Other        aunts and uncles both sides  . Arthritis Other        strong fmhx  . Colon cancer Other 74       maternal half-sister; deceased   Family Psychiatric  History: sister with developmenta disability Social History:  Social History   Substance and Sexual Activity  Alcohol Use No  . Alcohol/week: 0.0 standard drinks     Social History   Substance and Sexual Activity  Drug Use No    Social History   Socioeconomic History  . Marital status:  Single    Spouse name: Not on file  . Number of children: 0  . Years of education: Not on file  . Highest education level: Not on file  Occupational History  . Not on file  Social Needs  . Financial resource strain: Not on file  . Food insecurity:    Worry: Not on file    Inability: Not on file  . Transportation needs:    Medical: Not on file    Non-medical: Not on file  Tobacco Use  . Smoking status: Never Smoker  . Smokeless tobacco: Never Used  Substance and Sexual Activity  . Alcohol use: No    Alcohol/week: 0.0 standard drinks  . Drug use: No  . Sexual activity: Never  Lifestyle  . Physical activity:    Days per week: Not on file    Minutes per session: Not on file  . Stress: Not on file  Relationships  . Social connections:    Talks on phone: Not on file    Gets together: Not on file    Attends religious service: Not on file    Active member of club or organization: Not on file    Attends meetings of clubs or organizations: Not on file    Relationship status: Not on file  Other Topics Concern  . Not on file  Social History Narrative   Lives with sister, no pets   Occupation: disabled, for bipolar and arthritis   Edu: GED   Activity: take walks   Diet: good water, vegetables daily   Religion: Catholic      Psych - [employer], Dr. Jimmye Norman (ph [phone])   Additional Social History:                         Sleep: Fair  Appetite:  Fair  Current Medications: Current Facility-Administered Medications  Medication Dose Route Frequency Provider Last Rate Last Dose  . acetaminophen (TYLENOL) tablet 650 mg  650 mg Oral Q6H PRN Pucilowska, Jolanta B, MD      . alum & mag hydroxide-simeth (MAALOX/MYLANTA) 200-200-20 MG/5ML suspension 30 mL  30 mL Oral Q4H PRN Pucilowska, Jolanta B, MD      . bisacodyl (DULCOLAX) EC tablet 5 mg  5 mg Oral Daily PRN Pucilowska, Jolanta B, MD      . divalproex (DEPAKOTE) DR tablet 250 mg  250 mg Oral Q12H  Pucilowska, Jolanta B, MD   250 mg at 03/17/18 0815  . docusate sodium (COLACE) capsule 100 mg  100 mg Oral BID Pucilowska, Jolanta B, MD   100 mg at 03/17/18 0815  . lamoTRIgine (LAMICTAL) tablet 50 mg  50 mg Oral BID Pucilowska, Jolanta B, MD   50 mg at 03/17/18 0815  . levothyroxine (SYNTHROID, LEVOTHROID) tablet 50 mcg  50 mcg Oral QAC breakfast Pucilowska, Jolanta B, MD   50 mcg at 03/17/18 0815  . linagliptin (TRADJENTA) tablet 5 mg  5 mg Oral Daily Pucilowska, Jolanta B, MD   5 mg at 03/17/18 0812  . losartan (COZAAR) tablet 50 mg  50 mg Oral Daily Pucilowska, Jolanta B, MD   50 mg at 03/17/18 0812  . magnesium hydroxide (MILK OF MAGNESIA) suspension 30 mL  30 mL Oral Daily PRN Pucilowska, Jolanta B, MD      . metoprolol tartrate (LOPRESSOR) tablet 12.5 mg  12.5 mg Oral BID Pucilowska, Jolanta B, MD   12.5 mg at 03/17/18 0815  . [START ON 04/06/2018] paliperidone (INVEGA SUSTENNA) injection 234 mg  234 mg Intramuscular Q28 days Pucilowska, Jolanta B, MD      . polyethylene glycol (MIRALAX / GLYCOLAX) packet 17 g  17 g Oral Daily Pucilowska, Jolanta B, MD   17 g at 03/17/18 0981  . QUEtiapine (SEROQUEL) tablet 300 mg  300 mg Oral QHS Pucilowska, Jolanta B, MD   300 mg at 03/16/18 2145  . simethicone (MYLICON) chewable tablet 80 mg  80 mg Oral QID Pucilowska, Jolanta B, MD   80 mg at 03/17/18 1213  . simvastatin (ZOCOR) tablet 20 mg  20 mg Oral q1800 Pucilowska, Jolanta B, MD   20 mg at 03/16/18 1747    Lab Results:  Results for orders placed or  performed during the hospital encounter of 03/16/18 (from the past 48 hour(s))  Glucose, capillary     Status: Abnormal   Collection Time: 03/16/18  3:54 PM  Result Value Ref Range   Glucose-Capillary 112 (H) 70 - 99 mg/dL  Glucose, capillary     Status: Abnormal   Collection Time: 03/17/18  7:02 AM  Result Value Ref Range   Glucose-Capillary 131 (H) 70 - 99 mg/dL   Comment 1 Notify RN     Blood Alcohol level:  Lab Results  Component Value  Date   ETH <10 03/11/2018   ETH <10 53/61/4431    Metabolic Disorder Labs: Lab Results  Component Value Date   HGBA1C 6.6 (H) 02/18/2018   MPG 122.63 11/14/2017   MPG 114 04/14/2012   Lab Results  Component Value Date   PROLACTIN 33.6 (H) 12/04/2015   Lab Results  Component Value Date   CHOL 140 11/14/2017   TRIG 152 (H) 11/14/2017   HDL 50 11/14/2017   CHOLHDL 2.8 11/14/2017   VLDL 30 11/14/2017   LDLCALC 60 11/14/2017   LDLCALC 81 09/30/2017    Physical Findings: AIMS:  , ,  ,  ,    CIWA:    COWS:     Musculoskeletal: Strength & Muscle Tone: within normal limits Gait & Station: normal Patient leans: N/A  Psychiatric Specialty Exam: Physical Exam  Nursing note and vitals reviewed. Psychiatric: Her affect is labile. Her speech is rapid and/or pressured. She is hyperactive. Thought content is delusional. Cognition and memory are impaired. She expresses impulsivity.    Review of Systems  Gastrointestinal: Positive for constipation.  Neurological: Negative.   Psychiatric/Behavioral: The patient has insomnia.   All other systems reviewed and are negative.   Blood pressure (!) 149/72, pulse 76, temperature 98.5 F (36.9 C), temperature source Oral, resp. rate 16, height _0  (1.575 m), weight 87.1 kg, SpO2 100 %.Body mass index is 35.12 kg/m.  General Appearance: Casual  Eye Contact:  Good  Speech:  Clear and Coherent  Volume:  Increased  Mood:  Euphoric  Affect:  Congruent  Thought Process:  Irrelevant and Descriptions of Associations: Tangential  Orientation:  Full (Time, Place, and Person)  Thought Content:  Delusions and Obsessions  Suicidal Thoughts:  No  Homicidal Thoughts:  No  Memory:  Immediate;   Fair Recent;   Fair Remote;   Fair  Judgement:  Impaired  Insight:  Shallow  Psychomotor Activity:  Increased  Concentration:  Concentration: Fair and Attention Span: Fair  Recall:  [employer] of Knowledge:  Fair  Language:  Fair  Akathisia:  No   Handed:  Right  AIMS (if indicated):     Assets:  Communication Skills Desire for Improvement Financial Resources/Insurance Housing Physical Health Resilience Social Support  ADL's:  Intact  Cognition:  WNL  Sleep:  Number of Hours: 5.45     Treatment Plan Summary: Daily contact with patient to assess and evaluate symptoms and progress in treatment and Medication management   Ms. Slattery is a 67 year old female with a history of bipolar disorder admitted for a manic episode in the context of medication changes.She had severe insomnia and was given 30 mg of Restoril tonight causinh hypotonia and was briefly hospitalized at ICU.  #Bipolar disorder, hypomania -continue Invega sustenna injection 234 mg every 28 days, next dose on 9/3 -continue Seroquel 300 mg nightlyfor mood stabilization, anxiety and depression -continue Depakote taper 250 mg BID now, due to thrombocytopenia -continue Lamictal  50 mg BID  #Thrombocytopenia -Patient is being tapered off of Depakoteand started on Lamictal  #Insomnia -Melatonin 10 mg nightly  #Sleep apnea -CPAP nightly  #Hypothyroidism -Continue Synthroid 50 mcg p.o. daily  #HTN -Continue metoprolol 12.5 mg p.o. twice daily and Cozaar 50 mg p.o. daily  #DM -Tradjenta 5 mg daily -SSI, CBG, ADA diet  #Constipation -bowel regimen  #Dislipidemia  -Zocor 20 mg daily  #Labs -Hemoglobin A1c was 6.6 and total cholesterol was 140  #Disposition -discharge to home with the sister -follow up with [facility]   Ms. Gaut is a 67 year old female with a history of bipolar disorder admitted for a manic episode in the context of medication changes.She had severe insomnia and was given 30 mg of Restoril tonight causinh hypotonia and was briefly hospitalized at ICU.  #Bipolar disorder, hypomania -continue Invega sustenna injection 234 mg every 28 days, next dose on 9/3 -continue Seroquel 300 mg nightlyfor mood stabilization,  anxiety and depression -continue Depakote taper 250 mg BID now, due to thrombocytopenia -continue Lamictal 50 mg BID  #Thrombocytopenia -Patient is being tapered off of Depakoteand started on Lamictal  #Insomnia -Melatonin 10 mg nightly  #Sleep apnea -CPAP nightly  #Hypothyroidism -Continue Synthroid 50 mcg p.o. daily  #HTN -Continue metoprolol 12.5 mg p.o. twice daily and Cozaar 50 mg p.o. daily  #DM -Tradjenta 5 mg daily -SSI, CBG, ADA diet  #Constipation -bowel regimen  #Dislipidemia  -Zocor 20 mg daily  #Labs -Hemoglobin A1c was 6.6 and total cholesterol was 140  #Disposition -discharge to home with the sister -follow up with [facility]  Orson Slick, MD 03/17/2018, 2:31 PM

## 2018-03-17 NOTE — Tx Team (Addendum)
Interdisciplinary Treatment and Diagnostic Plan Update  03/17/2018 Time of Session: 10:50 AM Gina [name] MRN: [id number]  Principal Diagnosis: Bipolar I disorder, current or most recent episode manic, with psychotic features ([place])  Secondary Diagnoses: Principal Problem:   Bipolar I disorder, current or most recent episode manic, with psychotic features ([place]) Active Problems:   HTN (hypertension), benign   OSA on CPAP   Osteoarthritis of knee   Thrombocytopenia ([place])   Acquired hypothyroidism   Current Medications:  Current Facility-Administered Medications  Medication Dose Route Frequency Provider Last Rate Last Dose  . acetaminophen (TYLENOL) tablet 650 mg  650 mg Oral Q6H PRN Pucilowska, Jolanta B, MD      . alum & mag hydroxide-simeth (MAALOX/MYLANTA) 200-200-20 MG/5ML suspension 30 mL  30 mL Oral Q4H PRN Pucilowska, Jolanta B, MD      . bisacodyl (DULCOLAX) EC tablet 5 mg  5 mg Oral Daily PRN Pucilowska, Jolanta B, MD      . divalproex (DEPAKOTE) DR tablet 250 mg  250 mg Oral Q12H Pucilowska, Jolanta B, MD   250 mg at 03/17/18 0815  . docusate sodium (COLACE) capsule 100 mg  100 mg Oral BID Pucilowska, Jolanta B, MD   100 mg at 03/17/18 0815  . lamoTRIgine (LAMICTAL) tablet 50 mg  50 mg Oral BID Pucilowska, Jolanta B, MD   50 mg at 03/17/18 0815  . levothyroxine (SYNTHROID, LEVOTHROID) tablet 50 mcg  50 mcg Oral QAC breakfast Pucilowska, Jolanta B, MD   50 mcg at 03/17/18 0815  . linagliptin (TRADJENTA) tablet 5 mg  5 mg Oral Daily Pucilowska, Jolanta B, MD   5 mg at 03/17/18 0812  . losartan (COZAAR) tablet 50 mg  50 mg Oral Daily Pucilowska, Jolanta B, MD   50 mg at 03/17/18 0812  . magnesium citrate solution 1 Bottle  1 Bottle Oral Once Pucilowska, Jolanta B, MD      . magnesium hydroxide (MILK OF MAGNESIA) suspension 30 mL  30 mL Oral Daily PRN Pucilowska, Jolanta B, MD      . metoprolol tartrate (LOPRESSOR) tablet 12.5 mg  12.5 mg Oral BID Pucilowska, Jolanta B,  MD   12.5 mg at 03/17/18 0815  . [START ON 04/06/2018] paliperidone (INVEGA SUSTENNA) injection 234 mg  234 mg Intramuscular Q28 days Pucilowska, Jolanta B, MD      . polyethylene glycol (MIRALAX / GLYCOLAX) packet 17 g  17 g Oral Daily Pucilowska, Jolanta B, MD   17 g at 03/17/18 2951  . QUEtiapine (SEROQUEL) tablet 300 mg  300 mg Oral QHS Pucilowska, Jolanta B, MD   300 mg at 03/16/18 2145  . simethicone (MYLICON) chewable tablet 80 mg  80 mg Oral QID Pucilowska, Jolanta B, MD   80 mg at 03/17/18 1213  . simvastatin (ZOCOR) tablet 20 mg  20 mg Oral q1800 Pucilowska, Jolanta B, MD   20 mg at 03/16/18 1747   PTA Medications: Medications Prior to Admission  Medication Sig Dispense Refill Last Dose  . ACCU-CHEK AVIVA PLUS test strip 1 each by Other route daily. E11.21 100 each 3 Taking  . clotrimazole (LOTRIMIN) 1 % cream Apply 1 application topically 2 (two) times daily. Apply to toes   Past Month at Unknown time  . diclofenac sodium (VOLTAREN) 1 % GEL Apply 4 g topically 4 (four) times daily. To knees 5 Tube 5 Past Month at Unknown time  . divalproex (DEPAKOTE) 250 MG DR tablet Take 250 mg by mouth 2 (two) times daily.  unknown at unknown  . docusate sodium (COLACE) 100 MG capsule Take 2 capsules (200 mg total) by mouth 2 (two) times daily. 60 capsule 1 Past Month at Unknown time  . GAVILAX powder TAKE 17 GRAMS BY MOUTH TWICE DAILY AS NEEDED FOR MODERATE CONSTIPATION. (Patient taking differently: Take 17 g by mouth 2 (two) times daily as needed for moderate constipation. ) 510 g 1 PRN at PRN  . lamoTRIgine (LAMICTAL) 25 MG tablet Take 2 tablets (50 mg total) by mouth 2 (two) times daily. (Patient taking differently: Take 50 mg by mouth every morning. ) 120 tablet 1 Past Month at Unknown time  . levothyroxine (SYNTHROID, LEVOTHROID) 50 MCG tablet Take 1 tablet (50 mcg total) by mouth daily before breakfast. 30 tablet 11 Past Month at Unknown time  . losartan (COZAAR) 50 MG tablet Take 1 tablet (50 mg  total) by mouth daily. 30 tablet 11 Past Month at Unknown time  . metoprolol tartrate (LOPRESSOR) 25 MG tablet Take 0.5 tablets (12.5 mg total) by mouth 2 (two) times daily. 60 tablet 1 Past Month at Unknown time  . paliperidone (INVEGA SUSTENNA) 234 MG/1.5ML SUSP injection Inject 234 mg into the muscle every 30 (thirty) days.   Past Month at Unknown time  . QUEtiapine (SEROQUEL) 300 MG tablet Take 1 tablet (300 mg total) by mouth at bedtime.   Past Month at Unknown time  . simvastatin (ZOCOR) 20 MG tablet Take 1 tablet (20 mg total) by mouth at bedtime. 30 tablet 11 Past Month at Unknown time  . sitaGLIPtin (JANUVIA) 25 MG tablet Take 1 tablet (25 mg total) by mouth daily. 30 tablet 3 Past Month at Unknown time  . triamcinolone cream (KENALOG) 0.1 % Apply 1 application topically at bedtime. Apply to AA. 30 g 0 Past Month at Unknown time  . bisacodyl (DULCOLAX) 10 MG suppository Place 1 suppository (10 mg total) rectally daily as needed for moderate constipation. (Patient not taking: Reported on 03/16/2018) 12 suppository 0 Not Taking at Unknown time  . divalproex (DEPAKOTE) 500 MG DR tablet Take 1 tablet (500 mg total) by mouth 2 (two) times daily with a meal. (Patient not taking: Reported on 03/16/2018) 60 tablet 3 Not Taking at Unknown time  . senna (SENOKOT) 8.6 MG TABS tablet Take 1 tablet (8.6 mg total) by mouth at bedtime as needed for mild constipation. (Patient not taking: Reported on 03/16/2018) 120 each 0 Not Taking at Unknown time    Patient Stressors: Medication change or noncompliance  Patient Strengths: Ability for insight Motivation for treatment/growth Religious Affiliation Supportive family/friends  Treatment Modalities: Medication Management, Group therapy, Case management,  1 to 1 session with clinician, Psychoeducation, Recreational therapy.   Physician Treatment Plan for Primary Diagnosis: Bipolar I disorder, current or most recent episode manic, with psychotic features  ([place]) Long Term Goal(s): Improvement in symptoms so as ready for discharge NA   Short Term Goals: Ability to identify changes in lifestyle to reduce recurrence of condition will improve Ability to verbalize feelings will improve Ability to disclose and discuss suicidal ideas Ability to demonstrate self-control will improve Ability to identify and develop effective coping behaviors will improve Ability to maintain clinical measurements within normal limits will improve Ability to identify triggers associated with substance abuse/mental health issues will improve NA  Medication Management: Evaluate patient's response, side effects, and tolerance of medication regimen.  Therapeutic Interventions: 1 to 1 sessions, Unit Group sessions and Medication administration.  Evaluation of Outcomes: Progressing  Physician Treatment Plan  for Secondary Diagnosis: Principal Problem:   Bipolar I disorder, current or most recent episode manic, with psychotic features ([place]) Active Problems:   HTN (hypertension), benign   OSA on CPAP   Osteoarthritis of knee   Thrombocytopenia ([facility])   Acquired hypothyroidism  Long Term Goal(s): Improvement in symptoms so as ready for discharge NA   Short Term Goals: Ability to identify changes in lifestyle to reduce recurrence of condition will improve Ability to verbalize feelings will improve Ability to disclose and discuss suicidal ideas Ability to demonstrate self-control will improve Ability to identify and develop effective coping behaviors will improve Ability to maintain clinical measurements within normal limits will improve Ability to identify triggers associated with substance abuse/mental health issues will improve NA     Medication Management: Evaluate patient's response, side effects, and tolerance of medication regimen.  Therapeutic Interventions: 1 to 1 sessions, Unit Group sessions and Medication administration.  Evaluation of Outcomes:  Progressing   RN Treatment Plan for Primary Diagnosis: Bipolar I disorder, current or most recent episode manic, with psychotic features ([place]) Long Term Goal(s): Knowledge of disease and therapeutic regimen to maintain health will improve  Short Term Goals: Ability to remain free from injury will improve, Ability to demonstrate self-control and Compliance with prescribed medications will improve  Medication Management: RN will administer medications as ordered by provider, will assess and evaluate patient's response and provide education to patient for prescribed medication. RN will report any adverse and/or side effects to prescribing provider.  Therapeutic Interventions: 1 on 1 counseling sessions, Psychoeducation, Medication administration, Evaluate responses to treatment, Monitor vital signs and CBGs as ordered, Perform/monitor CIWA, COWS, AIMS and Fall Risk screenings as ordered, Perform wound care treatments as ordered.  Evaluation of Outcomes: Progressing   LCSW Treatment Plan for Primary Diagnosis: Bipolar I disorder, current or most recent episode manic, with psychotic features ([place]) Long Term Goal(s): Safe transition to appropriate next level of care at discharge, Engage patient in therapeutic group addressing interpersonal concerns.  Short Term Goals: Engage patient in aftercare planning with referrals and resources and Increase skills for wellness and recovery  Therapeutic Interventions: Assess for all discharge needs, 1 to 1 time with Social worker, Explore available resources and support systems, Assess for adequacy in community support network, Educate family and significant other(s) on suicide prevention, Complete Psychosocial Assessment, Interpersonal group therapy.  Evaluation of Outcomes: Progressing   Progress in Treatment: Attending groups: No. Participating in groups: No. Taking medication as prescribed: Yes. Toleration medication: Yes. Family/Significant other  contact made:yes, sister Patient understands diagnosis: Yes. Discussing patient identified problems/goals with staff: Yes. Medical problems stabilized or resolved: Yes. Denies suicidal/homicidal ideation: Yes. Issues/concerns per patient self-inventory: No. Other: n/a  New problem(s) identified: No, Describe:  No new problems identified  New Short Term/Long Term Goal(s):  Patient Goals:  "get my medication right"  Discharge Plan or Barriers: home [place] follow up with the ACTT team  Reason for Continuation of Hospitalization: Mania Medication stabilization  Estimated Length of Stay: 5-7 days  Recreational Therapy: Patient Stressors: N/A Patient Goal: Patient will focus on task/topic with 2 prompts from staff within 5 recreation therapy group sessions  Attendees: Patient: Gina [name] 03/17/2018 3:15 PM  Physician: Orson Slick, MD 03/17/2018 3:15 PM  Nursing:  03/17/2018 3:15 PM  RN Care Manager: 03/17/2018 3:15 PM  Social Worker: Dossie Arbour, LCSW 03/17/2018 3:15 PM  Recreational Therapist: Roanna Epley, LRT 03/17/2018 3:15 PM  Other: Darin Engels, [place] 03/17/2018 3:15 PM  Other: Alden Hipp,  LCSW 03/17/2018 3:15 PM  Other:  03/17/2018 3:15 PM    Scribe for Treatment Team: August Saucer, LCSW 03/17/2018 3:15 PM

## 2018-03-18 LAB — GLUCOSE, CAPILLARY: Glucose-Capillary: 84 mg/dL (ref 70–99)

## 2018-03-18 MED ORDER — AMANTADINE HCL 100 MG PO CAPS
100.0000 mg | ORAL_CAPSULE | Freq: Two times a day (BID) | ORAL | Status: DC
  Administered 2018-03-18 – 2018-04-02 (×30): 100 mg via ORAL
  Filled 2018-03-18 (×30): qty 1

## 2018-03-18 MED ORDER — HYDROCORTISONE 2.5 % RE CREA
TOPICAL_CREAM | Freq: Four times a day (QID) | RECTAL | Status: DC
Start: 2018-03-18 — End: 2018-04-05
  Administered 2018-03-18 – 2018-04-04 (×24): via RECTAL
  Filled 2018-03-18 (×2): qty 28.35

## 2018-03-18 NOTE — Progress Notes (Signed)
Recreation Therapy Notes  Date: 03/18/2018  Time: 9:30 am   Location: Craft Room   Behavioral response: N/A   Intervention Topic: Coping Skills  Discussion/Intervention: Patient did not attend group.   Clinical Observations/Feedback:  Patient did not attend group.   Ronelle Smallman LRT/CTRS        Samra Pesch 03/18/2018 10:40 AM

## 2018-03-18 NOTE — Plan of Care (Signed)
Patient is alert and oriented, denies SI, HI and AVH. Patient is very pleasant, upbeat and making jokes laughing this morning. Patient complains this morning of being "sleepy" stating " I didn't sleep really, they gave me a drink to help me use the bathroom and I have been having a time." Patient is now complaining of diarrhea due to having magnesium citrate. Patient states," I need someone in here at all times because I have diarrhea now." Nurse explained having diarrhea is not criteria for having a sitter. Nurse will continue to monitor throughout the day. There are no self injurious behavior. Problem: Education: Goal: Knowledge of [place] [facility] Education information/materials will improve Outcome: Progressing Goal: Emotional status will improve Outcome: Progressing Goal: Mental status will improve Outcome: Progressing Goal: Verbalization of understanding the information provided will improve Outcome: Progressing   Problem: Activity: Goal: Interest or engagement in activities will improve Outcome: Progressing Goal: Sleeping patterns will improve Outcome: Progressing

## 2018-03-18 NOTE — [facility] Group Notes (Signed)
LCSW Group Therapy Note   03/18/2018  Time: 1PM  Type of Therapy/Topic:  Group Therapy:  Balance in Life  Participation Level:  Did Not Attend  Description of Group:   This group will address the concept of balance and how it feels and looks when one is unbalanced. Patients will be encouraged to process areas in their lives that are out of balance and identify reasons for remaining unbalanced. Facilitators will guide patients in utilizing problem-solving interventions to address and correct the stressor making their life unbalanced. Understanding and applying boundaries will be explored and addressed for obtaining and maintaining a balanced life. Patients will be encouraged to explore ways to assertively make their unbalanced needs known to significant others in their lives, using other group members and facilitator for support and feedback.  Therapeutic Goals: 1. Patient will identify two or more emotions or situations they have that consume much of in their lives. 2. Patient will identify signs/triggers that life has become out of balance:  3. Patient will identify two ways to set boundaries in order to achieve balance in their lives:  4. Patient will demonstrate ability to communicate their needs through discussion and/or role plays  Summary of Patient Progress: Pt was invited to attend group but chose not to attend. CSW will continue to encourage pt to attend group throughout their admission.   Therapeutic Modalities:   Cognitive Behavioral Therapy Solution-Focused Therapy Assertiveness Training  Alden Hipp, MSW, LCSW Clinical Social Worker 03/18/2018 1:52 PM

## 2018-03-18 NOTE — [facility] Group Notes (Signed)
LCSW Group Therapy Note 03/18/2018 9:00 AM  Type of Therapy and Topic:  Group Therapy:  Setting Goals  Participation Level:  Did Not Attend  Description of Group: In this process group, patients discussed using strengths to work toward goals and address challenges.  Patients identified two positive things about themselves and one goal they were working on.  Patients were given the opportunity to share openly and support each other's plan for self-empowerment.  The group discussed the value of gratitude and were encouraged to have a daily reflection of positive characteristics or circumstances.  Patients were encouraged to identify a plan to utilize their strengths to work on current challenges and goals.  Therapeutic Goals 1. Patient will verbalize personal strengths/positive qualities and relate how these can assist with achieving desired personal goals 2. Patients will verbalize affirmation of peers plans for personal change and goal setting 3. Patients will explore the value of gratitude and positive focus as related to successful achievement of goals 4. Patients will verbalize a plan for regular reinforcement of personal positive qualities and circumstances.  Summary of Patient Progress:  Gina [name] was invited to today's group, but chose not to attend.     Therapeutic Modalities Cognitive Behavioral Therapy Motivational Interviewing    Gina [name], [place] 03/18/2018 10:38 AM

## 2018-03-18 NOTE — Plan of Care (Signed)
A&Ox3, HOH, no longer constipated, good sense of humor, denied pain, denied SI/HI/AVH. Visible in the Day Room during snacks time, mood and affect appropriate, slightly needy.  Problem: Education: Goal: Emotional status will improve Outcome: Progressing Goal: Mental status will improve Outcome: Progressing Goal: Verbalization of understanding the information provided will improve Outcome: Progressing   Problem: Activity: Goal: Interest or engagement in activities will improve Outcome: Progressing Goal: Sleeping patterns will improve Outcome: Progressing   Problem: Safety: Goal: Periods of time without injury will increase Outcome: Progressing   Problem: Health Behavior/Discharge Planning: Goal: Compliance with therapeutic regimen will improve Outcome: Progressing   Problem: Safety: Goal: Ability to remain free from injury will improve Outcome: Progressing

## 2018-03-18 NOTE — Progress Notes (Signed)
Recreation Therapy Notes  Date: 03/18/2018  Time: 3:00pm  Location: Craft room  Behavioral response: N/A  Group Type: Craft  Participation level: N/A  Communication: Patient did not attend group.  Comments: N/A  Sarie Stall LRT/CTRS        Jerimey Burridge 03/18/2018 3:26 PM

## 2018-03-18 NOTE — Progress Notes (Addendum)
[name] Progress Note  03/18/2018 7:03 PM Gina [name]  MRN:  [id number]  Subjective:    Gina [name] is a 67 year old female with a history or bad bipolar admitted for a manic episode after her Depakote needed to be tapered off due to thrombocytopenia. She is being cross tapered to Lamictal under ACT team guidance.  Gina [name] feels much better physically today. Diarrhea has resolved and she complains less of hemorrhoids. She however is still manic, disorganized, intrusive, talkative and insomniac.  Due to thrombocytopenia, decision was made to gradually transition from Depakote to Lamictal .It was ment to be done carefully so not to precipitate mania. So far, we have been folowing the plan. I do not believe this is sensible any longer. She is already manic and we need to find a substitute for Depakote while in the hospital. I will stop depakote and double Lamictal. It is unlikely that she stabilizes on Lamictal alone.  Principal Problem: Bipolar I disorder, current or most recent episode manic, with psychotic features ([place]) Diagnosis:   Patient Active Problem List   Diagnosis Date Noted  . Bipolar I disorder, current or most recent episode manic, with psychotic features ([place]) [F31.2] 11/04/2017    Priority: High  . Altered mental status, unspecified [R41.82] 03/16/2018  . Hypotension [I95.9] 03/15/2018  . Rash of hands [R21] 02/18/2018  . Pain of right heel [M79.671] 12/30/2017  . Encounter for chronic pain management [G89.29] 12/18/2017  . Foot pain, bilateral [id number], M79.672] 11/25/2017  . Gallbladder polyp [K82.4] 10/21/2017  . Left shoulder pain [M25.512] 10/21/2017  . Benign neoplasm of ascending colon [D12.2]   . Low back pain radiating to left lower extremity [M54.5, M79.605] 02/26/2017  . Acquired hypothyroidism [E03.9] 09/10/2016  . Thrombocytopenia ([place]) [D69.6] 06/15/2016  . Medicare annual wellness visit, subsequent [Z00.00] 06/04/2016  . Advanced care  planning/counseling discussion [Z71.89] 06/04/2016  . External hemorrhoid [K64.4] 04/02/2016  . Slow transit constipation [K59.01] 01/04/2016  . Bipolar I disorder, most recent episode (or current) manic ([place]) [F31.10] 12/03/2015  . NAFLD (nonalcoholic fatty liver disease) [K76.0] 03/28/2015  . Gallbladder calculus without cholecystitis [K80.20] 03/28/2015  . Gout [M10.9] 03/27/2015  . HCV antibody positive [R76.8] 03/27/2015  . History of bladder cancer [Z85.51] 03/27/2015  . History of bundle branch block [Z86.79] 03/27/2015  . Genetic testing [Z13.79] 02/28/2015  . Loss of memory [R41.3] 09/04/2014  . Controlled type 2 diabetes mellitus with diabetic nephropathy ([place]) [E11.21] 06/07/2014  . Hyperlipidemia [E78.5]   . Anemia in CKD (chronic kidney disease) [N18.9, D63.1] 01/26/2013  . Personal history of colonic adenomas and colon cancer [Z86.010] 11/11/2012  . Osteoarthritis of knee [M17.10] 10/02/2012  . OSA on CPAP [G47.33, Z99.89]   . CKD (chronic kidney disease) stage 4, GFR 15-29 ml/min (HCC) [N18.4]   . Severe obesity (BMI 35.0-39.9) with comorbidity ([place]) [E66.01]   . HTN (hypertension), benign [I10] 04/07/2012  . Hx of colon cancer, stage I [Z85.038] 08/05/2011   Total Time spent with patient: 20 minutes  Past Psychiatric History: bipolar disorder  Past Medical History:  Past Medical History:  Diagnosis Date  . Anemia in chronic kidney disease   . Bipolar depression [facility])    sees psychiatrist - psych admission 03/2015  . Bladder cancer ([place]) 1990's  . Blood transfusion without reported diagnosis 2009  . Cataract    left  . CKD (chronic kidney disease) stage 3, GFR 30-59 ml/min (HCC)   . Colon cancer ([place]) 1990's  . DJD (degenerative joint disease),  lumbar    chronic lower back pain  . Enterocutaneous fistula 04/07/2012   completed PT 06/2012 ([facility])  . Family history of breast cancer   . Family history of colon cancer   . Family history of stomach cancer   .  GERD (gastroesophageal reflux disease)   . History of bladder cancer 1997  . History of colon cancer    s/p surgery  . History of uterine cancer    s/p hysterectomy  . Hyperlipidemia   . Hypertension   . IDA (iron deficiency anemia) 01/2013   thought due to h/o polyps  . Obesity (BMI 30-39.9)   . OSA on CPAP    6cm H2O  . Personal history of colonic adenomas and colon cancer 11/11/2012  . Positive hepatitis C antibody test 09/2014   but negative confirmatory testing  . RBBB   . Uncontrolled type 2 diabetes mellitus with nephropathy ([place]) 06/07/2014   completed DSME 02/2016    Past Surgical History:  Procedure Laterality Date  . BREAST BIOPSY Right 01/2014   fibroadenoma  . COLONOSCOPY  07/2009  . COLONOSCOPY  11/2012   2 tubular adenomas, mild diverticulosis, pending genetic testing for Lynch syndrome Gina [name]) rpt 2 yrs  . COLONOSCOPY  02/2015   TA, diverticulosis, rpt 2 yrs Gina [name])  . COLONOSCOPY WITH PROPOFOL N/A 06/09/2017   TAx1, rpt 2 yrs Gina [name], Gina [name])  . DEXA  12/2009   WNL  . DOBUTAMINE STRESS ECHO  12/2009   no evidence of ischemia  . HERNIA REPAIR  02/04/12  . i & d abdominal wound  02/19/12  . LEFT OOPHORECTOMY  2005  . PARTIAL COLECTOMY  about 2008   for colon cancer  . PARTIAL HYSTERECTOMY  1981   uterine cancer, R ovary remains  . Reexploration of abdominal wound and Allograft placemet  02/26/12  . Removal of infected mesh and abdominal wound vac placement  02/24/12  . sleep study  02/2014   OSA - AHI 55, nadir 81% Gina [name])   Family History:  Family History  Problem Relation Age of Onset  . Colon cancer Mother 50  . Stomach cancer Mother        dx in her 71s?  . CAD Father        MI; deceased 82  . Mental illness Sister        anxiety/depression  . Thyroid disease Sister   . Breast cancer Maternal Grandmother        age at dx unknown  . Diabetes Brother   . Diabetes Brother   . Diabetes Other        aunts and uncles both sides  . Arthritis Other         strong fmhx  . Colon cancer Other 46       maternal half-sister; deceased   Family Psychiatric  History: intellectual disability Social History:  Social History   Substance and Sexual Activity  Alcohol Use No  . Alcohol/week: 0.0 standard drinks     Social History   Substance and Sexual Activity  Drug Use No    Social History   Socioeconomic History  . Marital status: Single    Spouse name: Not on file  . Number of children: 0  . Years of education: Not on file  . Highest education level: Not on file  Occupational History  . Not on file  Social Needs  . Financial resource strain: Not on file  . Food insecurity:    Worry:  Not on file    Inability: Not on file  . Transportation needs:    Medical: Not on file    Non-medical: Not on file  Tobacco Use  . Smoking status: Never Smoker  . Smokeless tobacco: Never Used  Substance and Sexual Activity  . Alcohol use: No    Alcohol/week: 0.0 standard drinks  . Drug use: No  . Sexual activity: Never  Lifestyle  . Physical activity:    Days per week: Not on file    Minutes per session: Not on file  . Stress: Not on file  Relationships  . Social connections:    Talks on phone: Not on file    Gets together: Not on file    Attends religious service: Not on file    Active member of club or organization: Not on file    Attends meetings of clubs or organizations: Not on file    Relationship status: Not on file  Other Topics Concern  . Not on file  Social History Narrative   Lives with sister, no pets   Occupation: disabled, for bipolar and arthritis   Edu: GED   Activity: take walks   Diet: good water, vegetables daily   Religion: [employer], Dr. Jimmye Norman (ph [phone])   Additional Social History:                         Sleep: Poor  Appetite:  Poor  Current Medications: Current Facility-Administered Medications  Medication Dose Route Frequency Provider  Last Rate Last Dose  . acetaminophen (TYLENOL) tablet 650 mg  650 mg Oral Q6H PRN Shemeika Starzyk B, [name]      . alum & mag hydroxide-simeth (MAALOX/MYLANTA) 200-200-20 MG/5ML suspension 30 mL  30 mL Oral Q4H PRN Christoper Bushey B, [name]      . bisacodyl (DULCOLAX) EC tablet 5 mg  5 mg Oral Daily PRN Jahlia Omura B, [name]   5 mg at 03/18/18 0333  . divalproex (DEPAKOTE) DR tablet 250 mg  250 mg Oral Q12H Tamra Koos B, [name]   250 mg at 03/18/18 0820  . docusate sodium (COLACE) capsule 100 mg  100 mg Oral BID Haidyn Chadderdon B, [name]   100 mg at 03/18/18 0820  . hydrocortisone (ANUSOL-HC) 2.5 % rectal cream   Rectal QID Lionardo Haze B, [name]      . lamoTRIgine (LAMICTAL) tablet 50 mg  50 mg Oral BID Shondra Capps B, [name]   50 mg at 03/18/18 1803  . levothyroxine (SYNTHROID, LEVOTHROID) tablet 50 mcg  50 mcg Oral QAC breakfast Alcie Runions B, [name]   50 mcg at 03/18/18 0821  . linagliptin (TRADJENTA) tablet 5 mg  5 mg Oral Daily Eder Macek B, [name]   5 mg at 03/18/18 0820  . losartan (COZAAR) tablet 50 mg  50 mg Oral Daily Donnika Kucher B, [name]   50 mg at 03/18/18 0820  . magnesium hydroxide (MILK OF MAGNESIA) suspension 30 mL  30 mL Oral Daily PRN Dione Mccombie B, [name]   30 mL at 03/18/18 0333  . metoprolol tartrate (LOPRESSOR) tablet 12.5 mg  12.5 mg Oral BID Breyton Vanscyoc B, [name]   12.5 mg at 03/18/18 1743  . [START ON 04/06/2018] paliperidone (INVEGA SUSTENNA) injection 234 mg  234 mg Intramuscular Q28 days Rondia Higginbotham B, [name]      . polyethylene glycol (MIRALAX / GLYCOLAX) packet 17 g  17 g Oral Daily Breandan People B, [name]   17 g at 03/18/18 8938  . QUEtiapine (SEROQUEL) tablet 300 mg  300 mg Oral QHS Marlita Keil B, [name]   300 mg at 03/17/18 2116  . simethicone (MYLICON) chewable tablet 80 mg  80 mg Oral QID Kayleah Appleyard B, [name]   80 mg at 03/18/18 0821  . simvastatin (ZOCOR) tablet 20 mg  20 mg Oral q1800 Kameelah Minish B, [name]    20 mg at 03/18/18 1744    Lab Results:  Results for orders placed or performed during the hospital encounter of 03/16/18 (from the past 48 hour(s))  Glucose, capillary     Status: Abnormal   Collection Time: 03/17/18  7:02 AM  Result Value Ref Range   Glucose-Capillary 131 (H) 70 - 99 mg/dL   Comment 1 Notify RN   Glucose, capillary     Status: None   Collection Time: 03/17/18  9:14 PM  Result Value Ref Range   Glucose-Capillary 85 70 - 99 mg/dL   Comment 1 Notify RN   Glucose, capillary     Status: None   Collection Time: 03/18/18  7:04 AM  Result Value Ref Range   Glucose-Capillary 84 70 - 99 mg/dL   Comment 1 Notify RN     Blood Alcohol level:  Lab Results  Component Value Date   ETH <10 03/11/2018   ETH <10 05/20/5101    Metabolic Disorder Labs: Lab Results  Component Value Date   HGBA1C 6.6 (H) 02/18/2018   MPG 122.63 11/14/2017   MPG 114 04/14/2012   Lab Results  Component Value Date   PROLACTIN 33.6 (H) 12/04/2015   Lab Results  Component Value Date   CHOL 140 11/14/2017   TRIG 152 (H) 11/14/2017   HDL 50 11/14/2017   CHOLHDL 2.8 11/14/2017   VLDL 30 11/14/2017   LDLCALC 60 11/14/2017   LDLCALC 81 09/30/2017    Physical Findings: AIMS:  , ,  ,  ,    CIWA:    COWS:     Musculoskeletal: Strength & Muscle Tone: within normal limits Gait & Station: normal Patient leans: N/A  Psychiatric Specialty Exam: Physical Exam  Nursing note and vitals reviewed. Psychiatric: Her affect is labile and inappropriate. Her speech is rapid and/or pressured and tangential. She is hyperactive. Thought content is delusional. Cognition and memory are impaired. She expresses impulsivity.    Review of Systems  Gastrointestinal: Positive for blood in stool and diarrhea.  Neurological: Negative.   Psychiatric/Behavioral: Positive for memory loss. The patient has insomnia.   All other systems reviewed and are negative.   Blood pressure 128/72, pulse 82, temperature  98.2 F (36.8 C), temperature source Oral, resp. rate 18, height 5\' 2"  (1.575 m), weight 87.1 kg, SpO2 100 %.Body mass index is 35.12 kg/m.  General Appearance: Fairly Groomed  Eye Contact:  Good  Speech:  Clear and Coherent and Pressured  Volume:  Increased  Mood:  Euphoric  Affect:  Congruent  Thought Process:  Irrelevant and Descriptions of Associations: Tangential  Orientation:  Full (Time, Place, and Person)  Thought Content:  Delusions  Suicidal Thoughts:  No  Homicidal Thoughts:  No  Memory:  Immediate;   Poor Recent;   Poor Remote;   Poor  Judgement:  Poor  Insight:  Lacking  Psychomotor Activity:  Increased and Tremor  Concentration:  Concentration: Poor and Attention Span: Poor  Recall:  Poor  Fund of Knowledge:  Poor  Language:  Poor  Akathisia:  No  Handed:  Right  AIMS (if indicated):     Assets:  Communication Skills Desire for Improvement Financial Resources/Insurance Housing Physical Health Resilience Social Support  ADL's:  Intact  Cognition:  WNL  Sleep:  Number of Hours: 4.45     Treatment Plan Summary: Daily contact with patient to assess and evaluate symptoms and progress in treatment and Medication management   Ms. Cena is a 67 year old female with a history of bipolar disorder admitted for a manic episode in the context of medication changes.She had severe insomnia and was given 30 mg of Restoril tonight causing hypotonia and was briefly hospitalized at ICU. She is still hypomanic  #Bipolar disorder, hypomania -continue Invega sustenna injection 234 mg every 28 days, next dose on 9/3 -continue Seroquel 300 mg nightlyfor mood stabilization, anxiety and depression -discontinue Depakote taper 250 mg BID now, due to thrombocytopenia -increase lamictal to 200 mg nightly tomorrow  #Thrombocytopenia -Patient is being tapered off of Depakoteand started on Lamictal  #Insomnia -Melatonin 10 mg nightly  #Sleep apnea -CPAP  nightly  #Hypothyroidism -Continue Synthroid 50 mcg p.o. daily  #HTN -Continue metoprolol 12.5 mg p.o. twice daily and Cozaar 50 mg p.o. daily  #DM -Tradjenta 5 mg daily -SSI, CBG, ADA diet  #Constipation -bowel regimen -Anusol PRN  #Dislipidemia  -Zocor 20 mg daily  #Labs -Hemoglobin A1c was 6.6 and total cholesterol was 140  #Disposition -discharge to home with the sister -follow up with [facility]  Orson Slick, [name] 03/18/2018, 7:03 PM

## 2018-03-18 NOTE — [facility] Group Notes (Signed)
[facility] Group Notes:  (Nursing/MHT/Case Management/Adjunct)  Date:  03/18/2018  Time:  9:26 PM  Type of Therapy:  Group Therapy  Participation Level:  Did Not Attend   [name] 03/18/2018, 9:26 PM

## 2018-03-18 NOTE — Plan of Care (Signed)
Visible in the milieu, peer to peer interactions appropriate, medication compliant, Mag Citrate @ bedtime, bed side commode provided, CPAP Set up and applied, denied SI/HI/AVH; mood and affect appropriate, upbeat, happy and smiling. Constipated, finally had BM but difficult to pass, Bisacodyl EC 5 mg and MOM 30 ml given @ 0333  Patient slept for Estimated Hours of 4.45; Precautionary checks every 15 minutes for safety maintained, room free of safety hazards, patient sustains no injury or falls during this shift.  Problem: Education: Goal: Emotional status will improve Outcome: Progressing Goal: Mental status will improve Outcome: Progressing Goal: Verbalization of understanding the information provided will improve Outcome: Progressing   Problem: Activity: Goal: Interest or engagement in activities will improve Outcome: Progressing   Problem: Coping: Goal: Ability to demonstrate self-control will improve Outcome: Progressing   Problem: Health Behavior/Discharge Planning: Goal: Compliance with treatment plan for underlying cause of condition will improve Outcome: Progressing   Problem: Safety: Goal: Periods of time without injury will increase Outcome: Progressing   Problem: Health Behavior/Discharge Planning: Goal: Ability to make decisions will improve Outcome: Progressing Goal: Compliance with therapeutic regimen will improve Outcome: Progressing   Problem: Safety: Goal: Ability to remain free from injury will improve Outcome: Progressing

## 2018-03-18 NOTE — Progress Notes (Signed)
Recreation Therapy Notes  INPATIENT RECREATION THERAPY ASSESSMENT  Patient Details Name: Odaly Peri MRN: [id number] DOB: 13-Nov-1950 Today's Date: 03/18/2018       Information Obtained From: (Patient unable to focus on completing assessment)  Able to Participate in Assessment/Interview:    Patient Presentation:    Reason for Admission (Per Patient):    Patient Stressors:    Coping Skills:      Leisure Interests (2+):     Frequency of Recreation/Participation:    Awareness of Community Resources:     [employer]:     Current Use:    If no, Barriers?:    Expressed Interest in [place] of Residence:     Patient Main Form of Transportation:    Patient Strengths:     Patient Identified Areas of Improvement:     Patient Goal for Hospitalization:     Current SI (including self-harm):     Current HI:     Current AVH:    Staff Intervention Plan:    Consent to Intern Participation:    Tiffeny Minchew 03/18/2018, 11:42 AM

## 2018-03-19 LAB — GLUCOSE, CAPILLARY: GLUCOSE-CAPILLARY: 140 mg/dL — AB (ref 70–99)

## 2018-03-19 MED ORDER — LAMOTRIGINE 100 MG PO TABS
200.0000 mg | ORAL_TABLET | Freq: Every day | ORAL | Status: DC
  Administered 2018-03-19 – 2018-03-29 (×11): 200 mg via ORAL
  Filled 2018-03-19 (×11): qty 2

## 2018-03-19 NOTE — Progress Notes (Addendum)
Recreation Therapy Notes  Date: 03/19/2018  Time: 9:30 am  Location: Craft Room  Behavioral response: Appropriate  Intervention Topic: Leisure  Discussion/Intervention:  Group content today was focused on leisure. The group defined what leisure is and some positive leisure activities they participate in. Individuals identified the difference between good and bad leisure. Participants expressed how they feel after participating in the leisure of their choice. The group discussed how they go about picking a leisure activity and if others are involved in their leisure activities. The patient stated how many leisure activities they too choose from and reasons why it is important to have leisure time. Individuals participated in the intervention "Leisure Jeopardy" where they had a chance to identify new leisure activities as well as benefits of leisure. Clinical Observations/Feedback:  Patient came to group and stated walking and ridding bikes are leisure activities she enjoys.  Yecenia Dalgleish LRT/CTRS         Maxx Calaway 03/19/2018 1:06 PM

## 2018-03-19 NOTE — Plan of Care (Signed)
Patient is alert and oriented, denies SI, HI and AVH. Patient very talkative to peers and staff, enjoys going to groups and participating. Patient is preforming personal hygiene; reports no more diarrhea today. Patient eating meals, reports pain 0/10; actively taking medications. Patient is pleasant and euphoric. Nurse will continue to monitor. Problem: Education: Goal: Knowledge of [place] [facility] Education information/materials will improve Outcome: Progressing Goal: Emotional status will improve Outcome: Progressing Goal: Mental status will improve Outcome: Progressing Goal: Verbalization of understanding the information provided will improve Outcome: Progressing   Problem: Coping: Goal: Ability to verbalize frustrations and anger appropriately will improve Outcome: Progressing Goal: Ability to demonstrate self-control will improve Outcome: Progressing

## 2018-03-19 NOTE — Progress Notes (Addendum)
Assumed care for this patient @0245 . Patient sleeping at this time. Q 15 minute checks maintained. Will continue to monitor throughout the remainder of the shift. Patient slept 6.25 hours. No apparent distress. Will endorse care to oncoming shift.

## 2018-03-19 NOTE — [facility] Group Notes (Signed)
  03/19/2018  Time: 1PM  Type of Therapy and Topic:  Group Therapy:  Feelings around Relapse and Recovery  Participation Level:  Active   Description of Group:    Patients in this group will discuss emotions they experience before and after a relapse. They will process how experiencing these feelings, or avoidance of experiencing them, relates to having a relapse. Facilitator will guide patients to explore emotions they have related to recovery. Patients will be encouraged to process which emotions are more powerful. They will be guided to discuss the emotional reaction significant others in their lives may have to their relapse or recovery. Patients will be assisted in exploring ways to respond to the emotions of others without this contributing to a relapse.  Therapeutic Goals: 1. Patient will identify two or more emotions that lead to a relapse for them 2. Patient will identify two emotions that result when they relapse 3. Patient will identify two emotions related to recovery 4. Patient will demonstrate ability to communicate their needs through discussion and/or role plays   Summary of Patient Progress: Pt continues to work towards their tx goals but has not yet reached them. Pt was able to appropriately participate in group discussion, and was able to offer support/validation to other group members. Pt reported feeling, "angry about everything that's going on in this world." Pt reports she relapsed with her mental health because her doctor changed her medications. She stated she'll avoid this by not changing her meds and following up with her ACT Team.    Therapeutic Modalities:   Cognitive Behavioral Therapy Solution-Focused Therapy Assertiveness Training Relapse Prevention Therapy  Alden Hipp, MSW, LCSW Clinical Social Worker 03/19/2018 1:30 PM

## 2018-03-20 LAB — GLUCOSE, CAPILLARY: Glucose-Capillary: 125 mg/dL — ABNORMAL HIGH (ref 70–99)

## 2018-03-20 NOTE — Progress Notes (Signed)
[facility] MD Progress Note  03/20/2018 11:26 AM AmeLie Hollars  MRN:  [id number]  Subjective:    Not too good, I have knee problem"  She manic, hyper verbal, disorganized, intrusive, talkative, ruminates about her chr medical issues, denies acute worsening.    Principal Problem: Bipolar I disorder, current or most recent episode manic, with psychotic features ([place]) Diagnosis:   Patient Active Problem List   Diagnosis Date Noted  . Altered mental status, unspecified [R41.82] 03/16/2018  . Hypotension [I95.9] 03/15/2018  . Rash of hands [R21] 02/18/2018  . Pain of right heel [M79.671] 12/30/2017  . Encounter for chronic pain management [G89.29] 12/18/2017  . Foot pain, bilateral [id number], M79.672] 11/25/2017  . Bipolar I disorder, current or most recent episode manic, with psychotic features ([place]) [F31.2] 11/04/2017  . Gallbladder polyp [K82.4] 10/21/2017  . Left shoulder pain [M25.512] 10/21/2017  . Benign neoplasm of ascending colon [D12.2]   . Low back pain radiating to left lower extremity [M54.5, M79.605] 02/26/2017  . Acquired hypothyroidism [E03.9] 09/10/2016  . Thrombocytopenia ([place]) [D69.6] 06/15/2016  . Medicare annual wellness visit, subsequent [Z00.00] 06/04/2016  . Advanced care planning/counseling discussion [Z71.89] 06/04/2016  . External hemorrhoid [K64.4] 04/02/2016  . Slow transit constipation [K59.01] 01/04/2016  . Bipolar I disorder, most recent episode (or current) manic ([place]) [F31.10] 12/03/2015  . NAFLD (nonalcoholic fatty liver disease) [K76.0] 03/28/2015  . Gallbladder calculus without cholecystitis [K80.20] 03/28/2015  . Gout [M10.9] 03/27/2015  . HCV antibody positive [R76.8] 03/27/2015  . History of bladder cancer [Z85.51] 03/27/2015  . History of bundle branch block [Z86.79] 03/27/2015  . Genetic testing [Z13.79] 02/28/2015  . Loss of memory [R41.3] 09/04/2014  . Controlled type 2 diabetes mellitus with diabetic nephropathy ([place]) [E11.21]  06/07/2014  . Hyperlipidemia [E78.5]   . Anemia in CKD (chronic kidney disease) [N18.9, D63.1] 01/26/2013  . Personal history of colonic adenomas and colon cancer [Z86.010] 11/11/2012  . Osteoarthritis of knee [M17.10] 10/02/2012  . OSA on CPAP [G47.33, Z99.89]   . CKD (chronic kidney disease) stage 4, GFR 15-29 ml/min (HCC) [N18.4]   . Severe obesity (BMI 35.0-39.9) with comorbidity ([place]) [E66.01]   . HTN (hypertension), benign [I10] 04/07/2012  . Hx of colon cancer, stage I [Z85.038] 08/05/2011   Total Time spent with patient: 25 min  Past Psychiatric History: bipolar disorder  Past Medical History:  Past Medical History:  Diagnosis Date  . Anemia in chronic kidney disease   . Bipolar depression [facility])    sees psychiatrist - psych admission 03/2015  . Bladder cancer ([place]) 1990's  . Blood transfusion without reported diagnosis 2009  . Cataract    left  . CKD (chronic kidney disease) stage 3, GFR 30-59 ml/min (HCC)   . Colon cancer ([place]) 1990's  . DJD (degenerative joint disease), lumbar    chronic lower back pain  . Enterocutaneous fistula 04/07/2012   completed PT 06/2012 ([facility])  . Family history of breast cancer   . Family history of colon cancer   . Family history of stomach cancer   . GERD (gastroesophageal reflux disease)   . History of bladder cancer 1997  . History of colon cancer    s/p surgery  . History of uterine cancer    s/p hysterectomy  . Hyperlipidemia   . Hypertension   . IDA (iron deficiency anemia) 01/2013   thought due to h/o polyps  . Obesity (BMI 30-39.9)   . OSA on CPAP    6cm H2O  . Personal history  of colonic adenomas and colon cancer 11/11/2012  . Positive hepatitis C antibody test 09/2014   but negative confirmatory testing  . RBBB   . Uncontrolled type 2 diabetes mellitus with nephropathy ([place]) 06/07/2014   completed DSME 02/2016    Past Surgical History:  Procedure Laterality Date  . BREAST BIOPSY Right 01/2014   fibroadenoma  .  COLONOSCOPY  07/2009  . COLONOSCOPY  11/2012   2 tubular adenomas, mild diverticulosis, pending genetic testing for Lynch syndrome [name]) rpt 2 yrs  . COLONOSCOPY  02/2015   TA, diverticulosis, rpt 2 yrs [name])  . COLONOSCOPY WITH PROPOFOL N/A 06/09/2017   TAx1, rpt 2 yrs [name], [name], MD)  . DEXA  12/2009   WNL  . DOBUTAMINE STRESS ECHO  12/2009   no evidence of ischemia  . HERNIA REPAIR  02/04/12  . i & d abdominal wound  02/19/12  . LEFT OOPHORECTOMY  2005  . PARTIAL COLECTOMY  about 2008   for colon cancer  . PARTIAL HYSTERECTOMY  1981   uterine cancer, R ovary remains  . Reexploration of abdominal wound and Allograft placemet  02/26/12  . Removal of infected mesh and abdominal wound vac placement  02/24/12  . sleep study  02/2014   OSA - AHI 55, nadir 81% [name])   Family History:  Family History  Problem Relation Age of Onset  . Colon cancer Mother 74  . Stomach cancer Mother        dx in her 72s?  . CAD Father        MI; deceased 72  . Mental illness Sister        anxiety/depression  . Thyroid disease Sister   . Breast cancer Maternal Grandmother        age at dx unknown  . Diabetes Brother   . Diabetes Brother   . Diabetes Other        aunts and uncles both sides  . Arthritis Other        strong fmhx  . Colon cancer Other 27       maternal half-sister; deceased   Family Psychiatric  History: intellectual disability Social History:  Social History   Substance and Sexual Activity  Alcohol Use No  . Alcohol/week: 0.0 standard drinks     Social History   Substance and Sexual Activity  Drug Use No    Social History   Socioeconomic History  . Marital status: Single    Spouse name: Not on file  . Number of children: 0  . Years of education: Not on file  . Highest education level: Not on file  Occupational History  . Not on file  Social Needs  . Financial resource strain: Not on file  . Food insecurity:    Worry: Not on file    Inability: Not on  file  . Transportation needs:    Medical: Not on file    Non-medical: Not on file  Tobacco Use  . Smoking status: Never Smoker  . Smokeless tobacco: Never Used  Substance and Sexual Activity  . Alcohol use: No    Alcohol/week: 0.0 standard drinks  . Drug use: No  . Sexual activity: Never  Lifestyle  . Physical activity:    Days per week: Not on file    Minutes per session: Not on file  . Stress: Not on file  Relationships  . Social connections:    Talks on phone: Not on file    Gets together: Not  on file    Attends religious service: Not on file    Active member of club or organization: Not on file    Attends meetings of clubs or organizations: Not on file    Relationship status: Not on file  Other Topics Concern  . Not on file  Social History Narrative   Lives with sister, no pets   Occupation: disabled, for bipolar and arthritis   Edu: GED   Activity: take walks   Diet: good water, vegetables daily   Religion: [place], Dr. Jimmye Norman (ph [phone])   Additional Social History:                         Sleep: Poor  Appetite:  Poor  Current Medications: Current Facility-Administered Medications  Medication Dose Route Frequency Provider Last Rate Last Dose  . acetaminophen (TYLENOL) tablet 650 mg  650 mg Oral Q6H PRN Pucilowska, Jolanta B, MD      . alum & mag hydroxide-simeth (MAALOX/MYLANTA) 200-200-20 MG/5ML suspension 30 mL  30 mL Oral Q4H PRN Pucilowska, Jolanta B, MD   30 mL at 03/20/18 1009  . amantadine (SYMMETREL) capsule 100 mg  100 mg Oral BID Pucilowska, Jolanta B, MD   100 mg at 03/20/18 0814  . bisacodyl (DULCOLAX) EC tablet 5 mg  5 mg Oral Daily PRN Pucilowska, Jolanta B, MD   5 mg at 03/18/18 0333  . docusate sodium (COLACE) capsule 100 mg  100 mg Oral BID Pucilowska, Jolanta B, MD   100 mg at 03/20/18 0814  . hydrocortisone (ANUSOL-HC) 2.5 % rectal cream   Rectal QID Pucilowska, Jolanta B, MD      .  lamoTRIgine (LAMICTAL) tablet 200 mg  200 mg Oral QHS Pucilowska, Jolanta B, MD   200 mg at 03/19/18 2208  . levothyroxine (SYNTHROID, LEVOTHROID) tablet 50 mcg  50 mcg Oral QAC breakfast Pucilowska, Jolanta B, MD   50 mcg at 03/20/18 0814  . linagliptin (TRADJENTA) tablet 5 mg  5 mg Oral Daily Pucilowska, Jolanta B, MD   5 mg at 03/20/18 0815  . losartan (COZAAR) tablet 50 mg  50 mg Oral Daily Pucilowska, Jolanta B, MD   50 mg at 03/20/18 0815  . magnesium hydroxide (MILK OF MAGNESIA) suspension 30 mL  30 mL Oral Daily PRN Pucilowska, Jolanta B, MD   30 mL at 03/18/18 0333  . metoprolol tartrate (LOPRESSOR) tablet 12.5 mg  12.5 mg Oral BID Pucilowska, Jolanta B, MD   12.5 mg at 03/20/18 0814  . [START ON 04/06/2018] paliperidone (INVEGA SUSTENNA) injection 234 mg  234 mg Intramuscular Q28 days Pucilowska, Jolanta B, MD      . polyethylene glycol (MIRALAX / GLYCOLAX) packet 17 g  17 g Oral Daily Pucilowska, Jolanta B, MD   17 g at 03/20/18 0815  . QUEtiapine (SEROQUEL) tablet 300 mg  300 mg Oral QHS Pucilowska, Jolanta B, MD   300 mg at 03/19/18 2209  . simethicone (MYLICON) chewable tablet 80 mg  80 mg Oral QID Pucilowska, Jolanta B, MD   80 mg at 03/20/18 0814  . simvastatin (ZOCOR) tablet 20 mg  20 mg Oral q1800 Pucilowska, Jolanta B, MD   20 mg at 03/19/18 1654    Lab Results:  Results for orders placed or performed during the hospital encounter of 03/16/18 (from the past 48 hour(s))  Glucose, capillary     Status: Abnormal  Collection Time: 03/19/18  6:49 AM  Result Value Ref Range   Glucose-Capillary 140 (H) 70 - 99 mg/dL   Comment 1 Notify RN   Glucose, capillary     Status: Abnormal   Collection Time: 03/20/18  7:05 AM  Result Value Ref Range   Glucose-Capillary 125 (H) 70 - 99 mg/dL   Comment 1 Notify RN     Blood Alcohol level:  Lab Results  Component Value Date   ETH <10 03/11/2018   ETH <10 05/69/7948    Metabolic Disorder Labs: Lab Results  Component Value Date    HGBA1C 6.6 (H) 02/18/2018   MPG 122.63 11/14/2017   MPG 114 04/14/2012   Lab Results  Component Value Date   PROLACTIN 33.6 (H) 12/04/2015   Lab Results  Component Value Date   CHOL 140 11/14/2017   TRIG 152 (H) 11/14/2017   HDL 50 11/14/2017   CHOLHDL 2.8 11/14/2017   VLDL 30 11/14/2017   LDLCALC 60 11/14/2017   LDLCALC 81 09/30/2017    Physical Findings: AIMS:  , ,  ,  ,    CIWA:    COWS:     Musculoskeletal: Strength & Muscle Tone: within normal limits Gait & Station: normal Patient leans: N/A  Psychiatric Specialty Exam: Physical Exam  Nursing note and vitals reviewed. Psychiatric: Her affect is labile and inappropriate. Her speech is rapid and/or pressured and tangential. She is hyperactive. Thought content is delusional. Cognition and memory are impaired. She expresses impulsivity.    Review of Systems  Gastrointestinal: Positive for blood in stool and diarrhea.  Neurological: Negative.   Psychiatric/Behavioral: Positive for memory loss. The patient has insomnia.   All other systems reviewed and are negative.   Blood pressure 131/76, pulse 82, temperature 97.8 F (36.6 C), temperature source Oral, resp. rate 16, height 5\' 2"  (1.575 m), weight 87.1 kg, SpO2 98 %.Body mass index is 35.12 kg/m.  General Appearance: Fairly Groomed  Eye Contact:  Good  Speech:  pressured  Volume:  Increased  Mood:  Euphoric  Affect:  Congruent, labile  Thought Process:  Irrelevant and Descriptions of Associations: Tangential  Orientation:  Full (Time, Place, and Person)  Thought Content:  Delusions  Suicidal Thoughts:  No  Homicidal Thoughts:  No  Memory:  Immediate;   Poor Recent;   Poor Remote;   Poor  Judgement:  Poor  Insight:  Lacking  Psychomotor Activity:  Increased and Tremor  Concentration:  Concentration: Poor and Attention Span: Poor  Recall:  Poor  Fund of Knowledge:  Poor  Language:  Poor  Akathisia:  No  Handed:  Right  AIMS (if indicated):     Assets:   Communication Skills Desire for Improvement Financial Resources/Insurance Housing Physical Health Resilience Social Support  ADL's:  Intact  Cognition:  WNL  Sleep:  Number of Hours: 6     Treatment Plan Summary: Daily contact with patient to assess and evaluate symptoms and progress in treatment and Medication management   Ms. Braddock is a 67 year old female with a history of bipolar disorder admitted for a manic episode in the context of medication changes.sleep improving. Pt still manic.  #Bipolar disorder, hypomania -continue Invega sustenna injection 234 mg every 28 days, next dose on 9/3 -continue Seroquel 300 mg nightlyfor mood stabilization, anxiety and depression -discontinue Depakote taper 250 mg BID now, due to thrombocytopenia -increase lamictal to 200 mg nightly   #Thrombocytopenia -Patient is being tapered off of Depakoteand started on Lamictal  #Insomnia -Melatonin  10 mg nightly  #Sleep apnea -CPAP nightly  #Hypothyroidism -Continue Synthroid 50 mcg p.o. daily  #Disposition -per primary team  Lenward Chancellor, MD 03/20/2018, 11:26 AMPatient ID: [name], female   DOB: 09/29/1950, 67 y.o.   MRN: [id number]

## 2018-03-20 NOTE — Plan of Care (Signed)
Patient is alert and oriented, denies SI, HI and AVH. Patient very talkative to peers and staff. Patient performs personal hygiene independently and shaved today with the assistance of MHT. Reports that her last BM was on 03/19/18.  Patient up in the milieu for medications and meals and actively participates in group and outside recreations. Patient is pleasant and compliant. Nurse will continue to monitor.

## 2018-03-20 NOTE — B[facility] Group Notes (Signed)
LCSW Group Therapy Note  03/20/2018 1:15pm  Type of Therapy and Topic:  Group Therapy:  Healthy Self Image and Positive Change  Participation Level:  Active   Description of Group:  In this group, patients will compare and contrast their current "I am...." statements to the visions they identify as desirable for their lives.  Patients discuss fears and how they can make positive changes in their cognitions that will positively impact their behaviors.  Facilitator played a motivational 3-minute speech and patients were left with the task of thinking about what "I am...." statements they can start using in their lives immediately.  Therapeutic Goals: 1. Patient will state their current self-perception as expressed in an "I Am" statement 2. Patient will contrast this with their desired vision for their live 3. Patient will identify 3 fears that negatively impact their behavior 4. Patient will discuss cognitive distortions that stem from their fears 5. Patient will verbalize statements that challenge their cognitive distortions  Summary of Patient Progress:  The patient reported that she feels "mad." The patient stated, "I am open to get well, whatever that looks like" Patient discussed her fears and how she can make positive changes in her cognitions that will positively impact her behaviors. Patient was able to discuss and process cognitive distortions that stem from  her fears. Patient actively and appropriately engaged in the group. Patient was able to provide support and validation to other group members. Patient practiced active listening when interacting with the facilitator and other group members.   Therapeutic Modalities Cognitive Behavioral Therapy Motivational Interviewing  Jan Walters  CUEBAS-COLON, LCSW 03/20/2018 12:14 PM

## 2018-03-20 NOTE — Progress Notes (Signed)
Patient ID: Gina [name], female   DOB: Sep 20, 1950, 67 y.o.   MRN: [id number] PER STATE REGULATIONS 482.30  THIS CHART WAS REVIEWED FOR MEDICAL NECESSITY WITH RESPECT TO THE PATIENT'S ADMISSION/DURATION OF STAY.  NEXT REVIEW DATE:03/24/18  Roma Schanz, RN, BSN CASE MANAGER

## 2018-03-20 NOTE — Plan of Care (Signed)
Patient is responding well to treatment regimen , compliant with her medicines with out any noticeable side effects, attends groups and participating in unit activities , assertive with peers and follows unit safety guide lines , sleep long hours with c-pap in place, patient voice no complains at this time appetite is good and well hydrated with fluids and juices, patient is comfortable, 15 minute safety rounding is maintained no distress.   Problem: Education: Goal: Knowledge of [place] General Education information/materials will improve Outcome: Progressing Goal: Emotional status will improve Outcome: Progressing Goal: Mental status will improve Outcome: Progressing Goal: Verbalization of understanding the information provided will improve Outcome: Progressing   Problem: Activity: Goal: Interest or engagement in activities will improve Outcome: Progressing Goal: Sleeping patterns will improve Outcome: Progressing   Problem: Coping: Goal: Ability to verbalize frustrations and anger appropriately will improve Outcome: Progressing Goal: Ability to demonstrate self-control will improve Outcome: Progressing   Problem: Health Behavior/Discharge Planning: Goal: Compliance with treatment plan for underlying cause of condition will improve Outcome: Progressing   Problem: Safety: Goal: Periods of time without injury will increase Outcome: Progressing   Problem: Activity: Goal: Interest or engagement in leisure activities will improve Outcome: Progressing Goal: Imbalance in normal sleep/wake cycle will improve Outcome: Progressing   Problem: Health Behavior/Discharge Planning: Goal: Ability to make decisions will improve Outcome: Progressing Goal: Compliance with therapeutic regimen will improve Outcome: Progressing   Problem: Safety: Goal: Ability to disclose and discuss suicidal ideas will improve Outcome: Progressing Goal: Ability to identify and utilize support  systems that promote safety will improve Outcome: Progressing   Problem: Safety: Goal: Ability to remain free from injury will improve Outcome: Progressing

## 2018-03-20 NOTE — BHH Group Notes (Signed)
[place] Group Notes:  (Nursing/MHT/Case Management/Adjunct)  Date:  03/20/2018  Time:  11:29 PM  Type of Therapy:  Group Therapy  Participation Level:  Active  Participation Quality:  Appropriate  Affect:  Excited  Cognitive:  Delusional  Insight:  None  Engagement in Group:  Engaged  Modes of Intervention:  Discussion  Summary of Progress/Problems: Gina [name] attended group. Gina [name] was eager to share with group. Gina [name] informed group she wanted to become a nun. Gina [name] stated she was married to God on this day. Gina [name] went on to inform group she wanted to take care of children. Gina [name] stated she did not believe in abortion and if women wanted to have one, they could give the baby to her. Gina [name] stated she would take care of all the unwanted children. [name] 03/20/2018, 11:29 PM

## 2018-03-21 LAB — GLUCOSE, CAPILLARY: GLUCOSE-CAPILLARY: 117 mg/dL — AB (ref 70–99)

## 2018-03-21 NOTE — Progress Notes (Signed)
D: Pt denies SI/HI/AVH. Pt is pleasant and cooperative, but very manic utilizing pressured speech, flights of ideas, and religous preoccupation. Pt. Reports her knee is bothering her this evening, but is very hard to gather pain and assessment data from due to mania.  Patient Interaction appropriate with staff and peers, but laughs very loudly to herself periodically. Pt. Continues to present euphoric and animated. Pt. Blood sugars monitored for safety. Pt. Needs to take her night time medications after her evening snack as it has been causing some trouble for the pt. To return to her room for the night without staff assistance for safety.   A: Q x 15 minute observation checks were completed for safety. Patient was provided with education, needs reinforcement.  Patient was given/offered medications per orders. Patient  was encourage to attend groups, participate in unit activities and continue with plan of care. Pt. Chart and plans of care reviewed. Pt. Given support and encouragement.   R: Patient is complaint with medication and unit procedures. Pt. Utilizes CPAP with no complications. Pt. Attends snacks and groups appropriately. Pt. Given extensive education on falls risk safety with front wheel walker. Pt. Chronic pain being managed with PRN medications.               Precautionary checks every 15 minutes for safety maintained, room free of safety hazards, patient sustains no injury or falls during this shift. Will endorse care to next shift.

## 2018-03-21 NOTE — Progress Notes (Signed)
D- Patient alert and oriented. Patient presents in a pleasant mood on assessment stating that she slept "pretty good" last night, however, she is very tangential, flight of ideas, and religiously preoccupied. Patient rated her left knee pain a "7/10", but did not request any pain medication from this writer at this time stating "it's getting a little bit better". Patient denies SI, HI, AVH, at this time. Patients goal for today is "going to pray".  A- Scheduled medications administered to patient, per MD orders. Support and encouragement provided.  Routine safety checks conducted every 15 minutes.  Patient informed to notify staff with problems or concerns.  R- No adverse drug reactions noted. Patient contracts for safety at this time. Patient compliant with medications and treatment plan. Patient receptive, calm, and cooperative. Patient interacts well with others on the unit.  Patient remains safe at this time.

## 2018-03-21 NOTE — Plan of Care (Signed)
Pt. Participation in unit activities and groups good. Pt. Attends snacks. Pt. Sleeping good with CPAP, no complications. Pt. Compliant with medications. Pt. Denies SI/HI and verbally is able to contract for safety. Pt. Able to remain safe utilizing fall risk precautions and front wheel walker. Pt. Continues to present manic and euphoric this evening. Pt. Needs reinforcement of educations provided.     Problem: Education: Goal: Knowledge of [place] General Education information/materials will improve Outcome: Not Progressing Goal: Emotional status will improve Outcome: Not Progressing Goal: Mental status will improve Outcome: Not Progressing   Problem: Activity: Goal: Interest or engagement in activities will improve Outcome: Progressing Goal: Sleeping patterns will improve Outcome: Progressing   Problem: Health Behavior/Discharge Planning: Goal: Compliance with treatment plan for underlying cause of condition will improve Outcome: Progressing   Problem: Safety: Goal: Periods of time without injury will increase Outcome: Progressing   Problem: Safety: Goal: Ability to disclose and discuss suicidal ideas will improve Outcome: Progressing   Problem: Safety: Goal: Ability to remain free from injury will improve Outcome: Progressing

## 2018-03-21 NOTE — Progress Notes (Signed)
Patient refused her hydrocortisone cream as well as her simethicone stating that "I'll let you know".

## 2018-03-21 NOTE — Plan of Care (Signed)
Patient verbalizes understanding of the general information that's been provided to her and all questions/concerns have been addressed and answered at this time. Patient denies SI/HI/AVH as well as any signs/symptoms of depression/anxiety at this time. Patient does state to this Probation officer "I just think about my sister". Patient continues to show interest in unit activities by attending/participating in unit groups as well as being present in the milieu throughout the day without any issues at this time. Patient has the ability to verbalize her anger and frustrations in an appropriate manner and has been demonstrating self-control on the unit thus far. Patient verbalizes understanding and has been in compliance with her prescribed therapeutic/medication regimen. Patient has been free from injury and remains safe on the unit at this time.  Problem: Education: Goal: Knowledge of [facility] [facility] Education information/materials will improve Outcome: Progressing Goal: Emotional status will improve Outcome: Progressing Goal: Mental status will improve Outcome: Progressing Goal: Verbalization of understanding the information provided will improve Outcome: Progressing   Problem: Activity: Goal: Interest or engagement in activities will improve Outcome: Progressing Goal: Sleeping patterns will improve Outcome: Progressing   Problem: Coping: Goal: Ability to verbalize frustrations and anger appropriately will improve Outcome: Progressing Goal: Ability to demonstrate self-control will improve Outcome: Progressing   Problem: Health Behavior/Discharge Planning: Goal: Compliance with treatment plan for underlying cause of condition will improve Outcome: Progressing   Problem: Safety: Goal: Periods of time without injury will increase Outcome: Progressing   Problem: Activity: Goal: Interest or engagement in leisure activities will improve Outcome: Progressing Goal: Imbalance in normal sleep/wake  cycle will improve Outcome: Progressing   Problem: Health Behavior/Discharge Planning: Goal: Ability to make decisions will improve Outcome: Progressing Goal: Compliance with therapeutic regimen will improve Outcome: Progressing   Problem: Safety: Goal: Ability to disclose and discuss suicidal ideas will improve Outcome: Progressing Goal: Ability to identify and utilize support systems that promote safety will improve Outcome: Progressing   Problem: Safety: Goal: Ability to remain free from injury will improve Outcome: Progressing

## 2018-03-21 NOTE — [facility] Group Notes (Signed)
LCSW Group Therapy Note 03/21/2018 1:15pm  Type of Therapy and Topic: Group Therapy: Feelings Around Returning Home & Establishing a Supportive Framework and Supporting Oneself When Supports Not Available  Participation Level: Active  Description of Group:  Patients first processed thoughts and feelings about upcoming discharge. These included fears of upcoming changes, lack of change, new living environments, judgements and expectations from others and overall stigma of mental health issues. The group then discussed the definition of a supportive framework, what that looks and feels like, and how do to discern it from an unhealthy non-supportive network. The group identified different types of supports as well as what to do when your family/friends are less than helpful or unavailable  Therapeutic Goals  1. Patient will identify one healthy supportive network that they can use at discharge. 2. Patient will identify one factor of a supportive framework and how to tell it from an unhealthy network. 3. Patient able to identify one coping skill to use when they do not have positive supports from others. 4. Patient will demonstrate ability to communicate their needs through discussion and/or role plays.  Summary of Patient Progress:  Patient reported that he head hurts. Pt engaged during group session. As patients processed their anxiety about discharge and described healthy supports patient shared she is not ready to be discharge because "her meds are not working yet."  Patients identified at least one self-care tool they were willing to use after discharge.   Therapeutic Modalities Cognitive Behavioral Therapy Motivational Interviewing   Dickson Kostelnik  CUEBAS-COLON, LCSW 03/21/2018 12:44 PM

## 2018-03-21 NOTE — Progress Notes (Signed)
[facility] MD Progress Note  03/21/2018 10:57 AM Gina [name]  MRN:  [id number]  Subjective:    Can I talk in private?"  pt still  manic, intrusive, labile, has pressured speech.    Principal Problem: Bipolar I disorder, current or most recent episode manic, with psychotic features ([place]) Diagnosis:   Patient Active Problem List   Diagnosis Date Noted  . Altered mental status, unspecified [R41.82] 03/16/2018  . Hypotension [I95.9] 03/15/2018  . Rash of hands [R21] 02/18/2018  . Pain of right heel [M79.671] 12/30/2017  . Encounter for chronic pain management [G89.29] 12/18/2017  . Foot pain, bilateral [id number], M79.672] 11/25/2017  . Bipolar I disorder, current or most recent episode manic, with psychotic features ([place]) [F31.2] 11/04/2017  . Gallbladder polyp [K82.4] 10/21/2017  . Left shoulder pain [M25.512] 10/21/2017  . Benign neoplasm of ascending colon [D12.2]   . Low back pain radiating to left lower extremity [M54.5, M79.605] 02/26/2017  . Acquired hypothyroidism [E03.9] 09/10/2016  . Thrombocytopenia ([place]) [D69.6] 06/15/2016  . Medicare annual wellness visit, subsequent [Z00.00] 06/04/2016  . Advanced care planning/counseling discussion [Z71.89] 06/04/2016  . External hemorrhoid [K64.4] 04/02/2016  . Slow transit constipation [K59.01] 01/04/2016  . Bipolar I disorder, most recent episode (or current) manic ([place]) [F31.10] 12/03/2015  . NAFLD (nonalcoholic fatty liver disease) [K76.0] 03/28/2015  . Gallbladder calculus without cholecystitis [K80.20] 03/28/2015  . Gout [M10.9] 03/27/2015  . HCV antibody positive [R76.8] 03/27/2015  . History of bladder cancer [Z85.51] 03/27/2015  . History of bundle branch block [Z86.79] 03/27/2015  . Genetic testing [Z13.79] 02/28/2015  . Loss of memory [R41.3] 09/04/2014  . Controlled type 2 diabetes mellitus with diabetic nephropathy ([place]) [E11.21] 06/07/2014  . Hyperlipidemia [E78.5]   . Anemia in CKD (chronic kidney disease)  [N18.9, D63.1] 01/26/2013  . Personal history of colonic adenomas and colon cancer [Z86.010] 11/11/2012  . Osteoarthritis of knee [M17.10] 10/02/2012  . OSA on CPAP [G47.33, Z99.89]   . CKD (chronic kidney disease) stage 4, GFR 15-29 ml/min (HCC) [N18.4]   . Severe obesity (BMI 35.0-39.9) with comorbidity ([place]) [E66.01]   . HTN (hypertension), benign [I10] 04/07/2012  . Hx of colon cancer, stage I [Z85.038] 08/05/2011   Total Time spent with patient: 25 min  Past Psychiatric History: bipolar disorder  Past Medical History:  Past Medical History:  Diagnosis Date  . Anemia in chronic kidney disease   . Bipolar depression [facility])    sees psychiatrist - psych admission 03/2015  . Bladder cancer ([place]) 1990's  . Blood transfusion without reported diagnosis 2009  . Cataract    left  . CKD (chronic kidney disease) stage 3, GFR 30-59 ml/min (HCC)   . Colon cancer ([place]) 1990's  . DJD (degenerative joint disease), lumbar    chronic lower back pain  . Enterocutaneous fistula 04/07/2012   completed PT 06/2012 ([facility])  . Family history of breast cancer   . Family history of colon cancer   . Family history of stomach cancer   . GERD (gastroesophageal reflux disease)   . History of bladder cancer 1997  . History of colon cancer    s/p surgery  . History of uterine cancer    s/p hysterectomy  . Hyperlipidemia   . Hypertension   . IDA (iron deficiency anemia) 01/2013   thought due to h/o polyps  . Obesity (BMI 30-39.9)   . OSA on CPAP    6cm H2O  . Personal history of colonic adenomas and colon cancer 11/11/2012  .  Positive hepatitis C antibody test 09/2014   but negative confirmatory testing  . RBBB   . Uncontrolled type 2 diabetes mellitus with nephropathy ([place]) 06/07/2014   completed DSME 02/2016    Past Surgical History:  Procedure Laterality Date  . BREAST BIOPSY Right 01/2014   fibroadenoma  . COLONOSCOPY  07/2009  . COLONOSCOPY  11/2012   2 tubular adenomas, mild  diverticulosis, pending genetic testing for Lynch syndrome [name]) rpt 2 yrs  . COLONOSCOPY  02/2015   TA, diverticulosis, rpt 2 yrs [name])  . COLONOSCOPY WITH PROPOFOL N/A 06/09/2017   TAx1, rpt 2 yrs [name], [name], MD)  . DEXA  12/2009   WNL  . DOBUTAMINE STRESS ECHO  12/2009   no evidence of ischemia  . HERNIA REPAIR  02/04/12  . i & d abdominal wound  02/19/12  . LEFT OOPHORECTOMY  2005  . PARTIAL COLECTOMY  about 2008   for colon cancer  . PARTIAL HYSTERECTOMY  1981   uterine cancer, R ovary remains  . Reexploration of abdominal wound and Allograft placemet  02/26/12  . Removal of infected mesh and abdominal wound vac placement  02/24/12  . sleep study  02/2014   OSA - AHI 55, nadir 81% [name])   Family History:  Family History  Problem Relation Age of Onset  . Colon cancer Mother 93  . Stomach cancer Mother        dx in her 32s?  . CAD Father        MI; deceased 54  . Mental illness Sister        anxiety/depression  . Thyroid disease Sister   . Breast cancer Maternal Grandmother        age at dx unknown  . Diabetes Brother   . Diabetes Brother   . Diabetes Other        aunts and uncles both sides  . Arthritis Other        strong fmhx  . Colon cancer Other 4       maternal half-sister; deceased   Family Psychiatric  History: intellectual disability Social History:  Social History   Substance and Sexual Activity  Alcohol Use No  . Alcohol/week: 0.0 standard drinks     Social History   Substance and Sexual Activity  Drug Use No    Social History   Socioeconomic History  . Marital status: Single    Spouse name: Not on file  . Number of children: 0  . Years of education: Not on file  . Highest education level: Not on file  Occupational History  . Not on file  Social Needs  . Financial resource strain: Not on file  . Food insecurity:    Worry: Not on file    Inability: Not on file  . Transportation needs:    Medical: Not on file    Non-medical:  Not on file  Tobacco Use  . Smoking status: Never Smoker  . Smokeless tobacco: Never Used  Substance and Sexual Activity  . Alcohol use: No    Alcohol/week: 0.0 standard drinks  . Drug use: No  . Sexual activity: Never  Lifestyle  . Physical activity:    Days per week: Not on file    Minutes per session: Not on file  . Stress: Not on file  Relationships  . Social connections:    Talks on phone: Not on file    Gets together: Not on file    Attends religious service: Not  on file    Active member of club or organization: Not on file    Attends meetings of clubs or organizations: Not on file    Relationship status: Not on file  Other Topics Concern  . Not on file  Social History Narrative   Lives with sister, no pets   Occupation: disabled, for bipolar and arthritis   Edu: GED   Activity: take walks   Diet: good water, vegetables daily   Religion: [place], Dr. Jimmye Norman (ph [phone])   Additional Social History:                         Sleep: Poor  Appetite:  Poor  Current Medications: Current Facility-Administered Medications  Medication Dose Route Frequency Provider Last Rate Last Dose  . acetaminophen (TYLENOL) tablet 650 mg  650 mg Oral Q6H PRN Pucilowska, Jolanta B, MD   650 mg at 03/20/18 2120  . alum & mag hydroxide-simeth (MAALOX/MYLANTA) 200-200-20 MG/5ML suspension 30 mL  30 mL Oral Q4H PRN Pucilowska, Jolanta B, MD   30 mL at 03/20/18 1009  . amantadine (SYMMETREL) capsule 100 mg  100 mg Oral BID Pucilowska, Jolanta B, MD   100 mg at 03/21/18 0844  . bisacodyl (DULCOLAX) EC tablet 5 mg  5 mg Oral Daily PRN Pucilowska, Jolanta B, MD   5 mg at 03/18/18 0333  . docusate sodium (COLACE) capsule 100 mg  100 mg Oral BID Pucilowska, Jolanta B, MD   100 mg at 03/21/18 0844  . hydrocortisone (ANUSOL-HC) 2.5 % rectal cream   Rectal QID Pucilowska, Jolanta B, MD      . lamoTRIgine (LAMICTAL) tablet 200 mg  200 mg Oral QHS  Pucilowska, Jolanta B, MD   200 mg at 03/20/18 2110  . levothyroxine (SYNTHROID, LEVOTHROID) tablet 50 mcg  50 mcg Oral QAC breakfast Pucilowska, Jolanta B, MD   50 mcg at 03/21/18 0844  . linagliptin (TRADJENTA) tablet 5 mg  5 mg Oral Daily Pucilowska, Jolanta B, MD   5 mg at 03/21/18 0844  . losartan (COZAAR) tablet 50 mg  50 mg Oral Daily Pucilowska, Jolanta B, MD   50 mg at 03/21/18 0844  . magnesium hydroxide (MILK OF MAGNESIA) suspension 30 mL  30 mL Oral Daily PRN Pucilowska, Jolanta B, MD   30 mL at 03/18/18 0333  . metoprolol tartrate (LOPRESSOR) tablet 12.5 mg  12.5 mg Oral BID Pucilowska, Jolanta B, MD   12.5 mg at 03/21/18 0844  . [START ON 04/06/2018] paliperidone (INVEGA SUSTENNA) injection 234 mg  234 mg Intramuscular Q28 days Pucilowska, Jolanta B, MD      . polyethylene glycol (MIRALAX / GLYCOLAX) packet 17 g  17 g Oral Daily Pucilowska, Jolanta B, MD   17 g at 03/21/18 0846  . QUEtiapine (SEROQUEL) tablet 300 mg  300 mg Oral QHS Pucilowska, Jolanta B, MD   300 mg at 03/20/18 2110  . simethicone (MYLICON) chewable tablet 80 mg  80 mg Oral QID Pucilowska, Jolanta B, MD   80 mg at 03/21/18 0844  . simvastatin (ZOCOR) tablet 20 mg  20 mg Oral q1800 Pucilowska, Jolanta B, MD   20 mg at 03/20/18 1702    Lab Results:  Results for orders placed or performed during the hospital encounter of 03/16/18 (from the past 48 hour(s))  Glucose, capillary     Status: Abnormal   Collection Time: 03/20/18  7:05  AM  Result Value Ref Range   Glucose-Capillary 125 (H) 70 - 99 mg/dL   Comment 1 Notify RN   Glucose, capillary     Status: Abnormal   Collection Time: 03/21/18  7:08 AM  Result Value Ref Range   Glucose-Capillary 117 (H) 70 - 99 mg/dL   Comment 1 Notify RN     Blood Alcohol level:  Lab Results  Component Value Date   ETH <10 03/11/2018   ETH <10 74/07/8785    Metabolic Disorder Labs: Lab Results  Component Value Date   HGBA1C 6.6 (H) 02/18/2018   MPG 122.63 11/14/2017   MPG  114 04/14/2012   Lab Results  Component Value Date   PROLACTIN 33.6 (H) 12/04/2015   Lab Results  Component Value Date   CHOL 140 11/14/2017   TRIG 152 (H) 11/14/2017   HDL 50 11/14/2017   CHOLHDL 2.8 11/14/2017   VLDL 30 11/14/2017   LDLCALC 60 11/14/2017   LDLCALC 81 09/30/2017    Physical Findings: AIMS:  , ,  ,  ,    CIWA:    COWS:     Musculoskeletal: Strength & Muscle Tone: within normal limits Gait & Station: normal Patient leans: N/A  Psychiatric Specialty Exam: Physical Exam  Nursing note and vitals reviewed. Psychiatric: Her affect is labile and inappropriate. Her speech is rapid and/or pressured and tangential. She is hyperactive. Thought content is delusional. Cognition and memory are impaired. She expresses impulsivity.    Review of Systems  Gastrointestinal: Positive for blood in stool and diarrhea.  Neurological: Negative.   Psychiatric/Behavioral: Positive for memory loss. The patient has insomnia.   All other systems reviewed and are negative.   Blood pressure (!) 146/66, pulse 66, temperature 98.2 F (36.8 C), temperature source Oral, resp. rate 16, height 5\' 2"  (1.575 m), weight 87.1 kg, SpO2 100 %.Body mass index is 35.12 kg/m.  General Appearance: Fairly Groomed  Eye Contact:  Good  Speech:  pressured  Volume:  Increased  Mood:  Euphoric  Affect:  Congruent, labile  Thought Process:  Irrelevant and Descriptions of Associations: Tangential  Orientation:  Full (Time, Place, and Person)  Thought Content:  Delusions, religious preoocupation  Suicidal Thoughts:  No  Homicidal Thoughts:  No  Memory:  Immediate;   Poor Recent;   Poor Remote;   Poor  Judgement:  Poor  Insight:  Lacking  Psychomotor Activity:  Increased and Tremor  Concentration:  Concentration: Poor and Attention Span: Poor  Recall:  Poor  Fund of Knowledge:  Poor  Language:  Poor  Akathisia:  No  Handed:  Right  AIMS (if indicated):     Assets:  Communication  Skills Desire for Improvement Financial Resources/Insurance Housing Physical Health Resilience Social Support  ADL's:  Intact  Cognition:  WNL  Sleep:  Number of Hours: 6.15     Treatment Plan Summary: Daily contact with patient to assess and evaluate symptoms and progress in treatment and Medication management   Ms. Mccolm is a 67 year old female with a history of bipolar disorder admitted for a manic episode in the context of medication changes.sleep improving. Pt still manic, labile.  #Bipolar disorder, hypomania -continue Invega sustenna injection 234 mg every 28 days, next dose on 9/3 -continue Seroquel 300 mg nightlyfor mood stabilization, anxiety and depression -discontinue Depakote taper 250 mg BID now, due to thrombocytopenia -lamictal to 200 mg nightly   #Thrombocytopenia -Patient is being tapered off of Depakoteand started on Lamictal  #Insomnia -Melatonin 10 mg  nightly  #Sleep apnea -CPAP nightly  #Hypothyroidism -Continue Synthroid 50 mcg p.o. daily  #Disposition -per primary team  Lenward Chancellor, MD 03/21/2018, 10:57 AMPatient ID: [name], female   DOB: [birth year], 67 y.o.   MRN: [id number] Patient ID: Makayla Lanter, female   DOB: 1950/11/02, 67 y.o.   MRN: [id number]

## 2018-03-22 ENCOUNTER — Other Ambulatory Visit: Payer: Self-pay

## 2018-03-22 LAB — GLUCOSE, CAPILLARY: Glucose-Capillary: 120 mg/dL — ABNORMAL HIGH (ref 70–99)

## 2018-03-22 NOTE — [facility] Group Notes (Signed)
[facility] Group Notes:  (Nursing/MHT/Case Management/Adjunct)  Date:  03/22/2018  Time:  9:40 PM  Type of Therapy:  Group Therapy  Participation Level:  Active  Participation Quality:  Sharing  Affect:  Appropriate  Cognitive:  Alert  Insight:  Good  Engagement in Group:  Engaged  Modes of Intervention:  Exploration  Summary of Progress/Problems:  Gina [name] 03/22/2018, 9:40 PM

## 2018-03-22 NOTE — Progress Notes (Addendum)
[facility] MD Progress Note  03/22/2018 5:25 PM Kenzlei Runions  MRN:  [id number]  Subjective:    Ms. Bean remains hypomanic with pressured, loud speech, hyperactivity and intrusivness.  She no longer takes Depakote suspected of causing thrombocytopenia. We will check platelets.  Spoke with [name], her outpatient psychiatrist who sees Tegretol as a next line of deffence if Lamictal is ineffective.  Principal Problem: Bipolar I disorder, current or most recent episode manic, with psychotic features ([place]) Diagnosis:   Patient Active Problem List   Diagnosis Date Noted  . Bipolar I disorder, current or most recent episode manic, with psychotic features ([place]) [F31.2] 11/04/2017    Priority: High  . Altered mental status, unspecified [R41.82] 03/16/2018  . Hypotension [I95.9] 03/15/2018  . Rash of hands [R21] 02/18/2018  . Pain of right heel [M79.671] 12/30/2017  . Encounter for chronic pain management [G89.29] 12/18/2017  . Foot pain, bilateral [id number], M79.672] 11/25/2017  . Gallbladder polyp [K82.4] 10/21/2017  . Left shoulder pain [M25.512] 10/21/2017  . Benign neoplasm of ascending colon [D12.2]   . Low back pain radiating to left lower extremity [M54.5, M79.605] 02/26/2017  . Acquired hypothyroidism [E03.9] 09/10/2016  . Thrombocytopenia ([place]) [D69.6] 06/15/2016  . Medicare annual wellness visit, subsequent [Z00.00] 06/04/2016  . Advanced care planning/counseling discussion [Z71.89] 06/04/2016  . External hemorrhoid [K64.4] 04/02/2016  . Slow transit constipation [K59.01] 01/04/2016  . Bipolar I disorder, most recent episode (or current) manic ([place]) [F31.10] 12/03/2015  . NAFLD (nonalcoholic fatty liver disease) [K76.0] 03/28/2015  . Gallbladder calculus without cholecystitis [K80.20] 03/28/2015  . Gout [M10.9] 03/27/2015  . HCV antibody positive [R76.8] 03/27/2015  . History of bladder cancer [Z85.51] 03/27/2015  . History of bundle branch block [Z86.79]  03/27/2015  . Genetic testing [Z13.79] 02/28/2015  . Loss of memory [R41.3] 09/04/2014  . Controlled type 2 diabetes mellitus with diabetic nephropathy ([place]) [E11.21] 06/07/2014  . Hyperlipidemia [E78.5]   . Anemia in CKD (chronic kidney disease) [N18.9, D63.1] 01/26/2013  . Personal history of colonic adenomas and colon cancer [Z86.010] 11/11/2012  . Osteoarthritis of knee [M17.10] 10/02/2012  . OSA on CPAP [G47.33, Z99.89]   . CKD (chronic kidney disease) stage 4, GFR 15-29 ml/min (HCC) [N18.4]   . Severe obesity (BMI 35.0-39.9) with comorbidity ([place]) [E66.01]   . HTN (hypertension), benign [I10] 04/07/2012  . Hx of colon cancer, stage I [Z85.038] 08/05/2011   Total Time spent with patient: 20 minutes  Past Psychiatric History: bipolar disorder  Past Medical History:  Past Medical History:  Diagnosis Date  . Anemia in chronic kidney disease   . Bipolar depression [facility])    sees psychiatrist - psych admission 03/2015  . Bladder cancer ([place]) 1990's  . Blood transfusion without reported diagnosis 2009  . Cataract    left  . CKD (chronic kidney disease) stage 3, GFR 30-59 ml/min (HCC)   . Colon cancer ([place]) 1990's  . DJD (degenerative joint disease), lumbar    chronic lower back pain  . Enterocutaneous fistula 04/07/2012   completed PT 06/2012 ([facility])  . Family history of breast cancer   . Family history of colon cancer   . Family history of stomach cancer   . GERD (gastroesophageal reflux disease)   . History of bladder cancer 1997  . History of colon cancer    s/p surgery  . History of uterine cancer    s/p hysterectomy  . Hyperlipidemia   . Hypertension   . IDA (iron deficiency anemia) 01/2013  thought due to h/o polyps  . Obesity (BMI 30-39.9)   . OSA on CPAP    6cm H2O  . Personal history of colonic adenomas and colon cancer 11/11/2012  . Positive hepatitis C antibody test 09/2014   but negative confirmatory testing  . RBBB   . Uncontrolled type 2 diabetes  mellitus with nephropathy ([place]) 06/07/2014   completed DSME 02/2016    Past Surgical History:  Procedure Laterality Date  . BREAST BIOPSY Right 01/2014   fibroadenoma  . COLONOSCOPY  07/2009  . COLONOSCOPY  11/2012   2 tubular adenomas, mild diverticulosis, pending genetic testing for Lynch syndrome [name]) rpt 2 yrs  . COLONOSCOPY  02/2015   TA, diverticulosis, rpt 2 yrs [name])  . COLONOSCOPY WITH PROPOFOL N/A 06/09/2017   TAx1, rpt 2 yrs [name], [name], MD)  . DEXA  12/2009   WNL  . DOBUTAMINE STRESS ECHO  12/2009   no evidence of ischemia  . HERNIA REPAIR  02/04/12  . i & d abdominal wound  02/19/12  . LEFT OOPHORECTOMY  2005  . PARTIAL COLECTOMY  about 2008   for colon cancer  . PARTIAL HYSTERECTOMY  1981   uterine cancer, R ovary remains  . Reexploration of abdominal wound and Allograft placemet  02/26/12  . Removal of infected mesh and abdominal wound vac placement  02/24/12  . sleep study  02/2014   OSA - AHI 55, nadir 81% [name])   Family History:  Family History  Problem Relation Age of Onset  . Colon cancer Mother 61  . Stomach cancer Mother        dx in her 83s?  . CAD Father        MI; deceased 82  . Mental illness Sister        anxiety/depression  . Thyroid disease Sister   . Breast cancer Maternal Grandmother        age at dx unknown  . Diabetes Brother   . Diabetes Brother   . Diabetes Other        aunts and uncles both sides  . Arthritis Other        strong fmhx  . Colon cancer Other 8       maternal half-sister; deceased   Family Psychiatric  History: sister with developmental disability Social History:  Social History   Substance and Sexual Activity  Alcohol Use No  . Alcohol/week: 0.0 standard drinks     Social History   Substance and Sexual Activity  Drug Use No    Social History   Socioeconomic History  . Marital status: Single    Spouse name: Not on file  . Number of children: 0  . Years of education: Not on file  . Highest  education level: Not on file  Occupational History  . Not on file  Social Needs  . Financial resource strain: Not on file  . Food insecurity:    Worry: Not on file    Inability: Not on file  . Transportation needs:    Medical: Not on file    Non-medical: Not on file  Tobacco Use  . Smoking status: Never Smoker  . Smokeless tobacco: Never Used  Substance and Sexual Activity  . Alcohol use: No    Alcohol/week: 0.0 standard drinks  . Drug use: No  . Sexual activity: Never  Lifestyle  . Physical activity:    Days per week: Not on file    Minutes per session: Not on file  .  Stress: Not on file  Relationships  . Social connections:    Talks on phone: Not on file    Gets together: Not on file    Attends religious service: Not on file    Active member of club or organization: Not on file    Attends meetings of clubs or organizations: Not on file    Relationship status: Not on file  Other Topics Concern  . Not on file  Social History Narrative   Lives with sister, no pets   Occupation: disabled, for bipolar and arthritis   Edu: GED   Activity: take walks   Diet: good water, vegetables daily   Religion: Catholic      Psych - [employer], Dr. Jimmye Norman (ph [phone])   Additional Social History:                         Sleep: Fair  Appetite:  Fair  Current Medications: Current Facility-Administered Medications  Medication Dose Route Frequency Provider Last Rate Last Dose  . acetaminophen (TYLENOL) tablet 650 mg  650 mg Oral Q6H PRN Dacota Devall B, MD   650 mg at 03/22/18 0907  . alum & mag hydroxide-simeth (MAALOX/MYLANTA) 200-200-20 MG/5ML suspension 30 mL  30 mL Oral Q4H PRN Shifra Swartzentruber B, MD   30 mL at 03/20/18 1009  . amantadine (SYMMETREL) capsule 100 mg  100 mg Oral BID Tosca Pletz B, MD   100 mg at 03/22/18 1716  . bisacodyl (DULCOLAX) EC tablet 5 mg  5 mg Oral Daily PRN Cayle Thunder B, MD   5 mg at 03/18/18  0333  . docusate sodium (COLACE) capsule 100 mg  100 mg Oral BID Oddie Bottger B, MD   100 mg at 03/22/18 1716  . hydrocortisone (ANUSOL-HC) 2.5 % rectal cream   Rectal QID Kaevon Cotta B, MD      . lamoTRIgine (LAMICTAL) tablet 200 mg  200 mg Oral QHS Ashlynn Gunnels B, MD   200 mg at 03/21/18 2207  . levothyroxine (SYNTHROID, LEVOTHROID) tablet 50 mcg  50 mcg Oral QAC breakfast Starasia Sinko B, MD   50 mcg at 03/22/18 0900  . linagliptin (TRADJENTA) tablet 5 mg  5 mg Oral Daily Beya Tipps B, MD   5 mg at 03/22/18 0900  . losartan (COZAAR) tablet 50 mg  50 mg Oral Daily Boss Danielsen B, MD   50 mg at 03/22/18 0900  . magnesium hydroxide (MILK OF MAGNESIA) suspension 30 mL  30 mL Oral Daily PRN Eavan Gonterman B, MD   30 mL at 03/18/18 0333  . metoprolol tartrate (LOPRESSOR) tablet 12.5 mg  12.5 mg Oral BID Erskine Steinfeldt B, MD   12.5 mg at 03/22/18 1716  . [START ON 04/06/2018] paliperidone (INVEGA SUSTENNA) injection 234 mg  234 mg Intramuscular Q28 days Brandon Wiechman B, MD      . polyethylene glycol (MIRALAX / GLYCOLAX) packet 17 g  17 g Oral Daily Damean Poffenberger B, MD   17 g at 03/22/18 0900  . QUEtiapine (SEROQUEL) tablet 300 mg  300 mg Oral QHS Decarlos Empey B, MD   300 mg at 03/21/18 2230  . simethicone (MYLICON) chewable tablet 80 mg  80 mg Oral QID Ahriyah Vannest B, MD   80 mg at 03/22/18 1716  . simvastatin (ZOCOR) tablet 20 mg  20 mg Oral q1800 Khan Chura B, MD   20 mg at 03/22/18 1718    Lab Results:  Results for orders placed or performed during the hospital encounter of 03/16/18 (from the past 48 hour(s))  Glucose, capillary     Status: Abnormal   Collection Time: 03/21/18  7:08 AM  Result Value Ref Range   Glucose-Capillary 117 (H) 70 - 99 mg/dL   Comment 1 Notify RN   Glucose, capillary     Status: Abnormal   Collection Time: 03/22/18  7:03 AM  Result Value Ref Range   Glucose-Capillary 120 (H) 70 -  99 mg/dL    Blood Alcohol level:  Lab Results  Component Value Date   ETH <10 03/11/2018   ETH <10 02/72/5366    Metabolic Disorder Labs: Lab Results  Component Value Date   HGBA1C 6.6 (H) 02/18/2018   MPG 122.63 11/14/2017   MPG 114 04/14/2012   Lab Results  Component Value Date   PROLACTIN 33.6 (H) 12/04/2015   Lab Results  Component Value Date   CHOL 140 11/14/2017   TRIG 152 (H) 11/14/2017   HDL 50 11/14/2017   CHOLHDL 2.8 11/14/2017   VLDL 30 11/14/2017   LDLCALC 60 11/14/2017   LDLCALC 81 09/30/2017    Physical Findings: AIMS:  , ,  ,  ,    CIWA:    COWS:     Musculoskeletal: Strength & Muscle Tone: within normal limits Gait & Station: normal Patient leans: N/A  Psychiatric Specialty Exam: Physical Exam  Nursing note and vitals reviewed. Psychiatric: Her affect is labile and inappropriate. Her speech is rapid and/or pressured. She is hyperactive. Thought content is delusional. Cognition and memory are impaired. She expresses impulsivity.    Review of Systems  Neurological: Negative.   Psychiatric/Behavioral: The patient has insomnia.   All other systems reviewed and are negative.   Blood pressure 140/78, pulse 67, temperature 97.8 F (36.6 C), temperature source Oral, resp. rate 18, height 5\' 2"  (1.575 m), weight 87.1 kg, SpO2 96 %.Body mass index is 35.12 kg/m.  General Appearance: Casual  Eye Contact:  Good  Speech:  Clear and Coherent and Pressured  Volume:  Increased  Mood:  Euphoric  Affect:  Congruent  Thought Process:  Goal Directed, Irrelevant and Descriptions of Associations: Tangential  Orientation:  Full (Time, Place, and Person)  Thought Content:  Delusions  Suicidal Thoughts:  No  Homicidal Thoughts:  No  Memory:  Immediate;   Fair Recent;   Fair Remote;   Fair  Judgement:  Poor  Insight:  Shallow  Psychomotor Activity:  Increased  Concentration:  Concentration: Fair and Attention Span: Fair  Recall:  [employer] of  Knowledge:  Fair  Language:  Fair  Akathisia:  No  Handed:  Right  AIMS (if indicated):     Assets:  Communication Skills Desire for Improvement Financial Resources/Insurance Housing Physical Health Resilience Social Support  ADL's:  Intact  Cognition:  WNL  Sleep:  Number of Hours: 6.45     Treatment Plan Summary: Daily contact with patient to assess and evaluate symptoms and progress in treatment and Medication management   Ms. Laduke is a 67 year old female with a history of bipolar disorder admitted for a manic episode in the context of medication changes.She had severe insomnia and was given 30 mg of Restoril tonight causinghypotonia and was briefly hospitalized at ICU. She is still hypomanic. She tolerates transition from Depakote to Lamictal well but remains hypomanic.  #Bipolar disorder, hypomania -continue Invega sustenna injection 234 mg every 28 days, next dose on 9/3 -continue Seroquel 300 mg nightlyfor  mood stabilization, anxiety and depression -completed Depakote taper due to thrombocytopenia -continue Lamictal to 200 mg nightly  #Thrombocytopenia -Patient is being tapered off of Depakoteand started on Lamictal -check CBC in AM  #Insomnia -Melatonin 10 mg nightly  #Sleep apnea -CPAP nightly  #Hypothyroidism -Continue Synthroid 50 mcg p.o. daily  #HTN -Continue metoprolol 12.5 mg p.o. twice daily and Cozaar 50 mg p.o. daily  #DM -Tradjenta 5 mg daily -SSI, CBG, ADA diet  #Constipation -bowel regimen -Anusol PRN  #Dislipidemia  -Zocor 20 mg daily  #Labs -Hemoglobin A1c was 6.6 and total cholesterol was 140  #Disposition -discharge to home with the sister -follow up with [facility] ACT team   Orson Slick, MD 03/22/2018, 5:25 PM

## 2018-03-22 NOTE — Progress Notes (Signed)
D:Pt denies SI/HI/AVH. Pt is pleasant and cooperative, but still very manic utilizing pressured speech, flights of ideas, and religous preoccupation. Pt. Denies pain this evening, despite being asked several times. Patient Interaction appropriate with staff and peers, but laughs very loudly to herself periodically and has outbursts of loud flights of ideas. Pt. Continues to present very euphoric and animated. Pt. Tolerated night medications much better receiving them this evening after snacks.   A: Q x 15 minute observation checks were completed for safety. Patient was provided with education, needs reinforcement. Patient was given/offered medications per orders. Patient was encourage to attend groups, participate in unit activities and continue with plan of care. Pt. Chart and plans of care reviewed. Pt. Given support and encouragement.  R:Patient is complaint with medication and unit procedures. Pt. Utilizes CPAP with no complications. Pt. Attends snacks and groups appropriately. Pt. Given extensive education on falls risk safety with front wheel walker. Pt. Chronic pain being managed with PRN medications. Blood sugars monitored per orders. Pt. Does continue to refuse hydrocortisone cream. Pt. Insists she is not having problems in the designated area and does not want. Pt. Does take simethicone though with no complications.               Precautionary checks every 15 minutes for safety maintained, room free of safety hazards, patient sustains no injury or falls during this shift. Will endorse care to next shift.

## 2018-03-22 NOTE — Progress Notes (Signed)
Recreation Therapy Notes  Date: 03/22/2018  Time: 9:30 am  Location: Craft Room  Behavioral response: Not Engaged   Intervention Topic: Stress  Discussion/Intervention:  Group content on today was focused on stress. The group defined stress and way to cope with stress. Participants expressed how they know when they are stresses out. Individuals described the different ways they have to cope with stress. The group stated reasons why it is important to cope with stress. Patient explained what good stress is and some examples. The group participated in the intervention "Relieving Stress". Individuals were able to explore different stressors they face any way to handle them.  . Clinical Observations/Feedback:  Patient came to group late due to unknown reasons.  Mayana Irigoyen LRT/CTRS         [place] Kirchner 03/22/2018 1:32 PM

## 2018-03-22 NOTE — Patient Outreach (Signed)
[facility]) Care Management  03/22/2018  Shaterra Sanzone Jan 25, 1951 [id number]  Unable to contact patient. Upon chart review patient still admitted to hospital.   PLAN; RNCM will follow up regarding patients discharge disposition.   Quinn Plowman RN,BSN,CCM [facility] Telephonic  [phone]

## 2018-03-22 NOTE — Progress Notes (Signed)
D- Patient alert and oriented. Patient presents in a pleasant mood on assessment stating that she slept well last night and had no major complaints to voice other than having a headache stating to this writer "it comes and goes" and she did request pain medication from this Probation officer. Patient denies SI, HI, AVH, at this time stating "no, I don't believe in that stuff". Patient also denies any signs/symptoms of depression and anxiety "no, I like to be happy". Patient's goal for today is "to get well".  A- Scheduled medications administered to patient, per MD orders. Support and encouragement provided.  Routine safety checks conducted every 15 minutes.  Patient informed to notify staff with problems or concerns.  R- No adverse drug reactions noted. Patient contracts for safety at this time. Patient compliant with medications and treatment plan. Patient receptive, calm, and cooperative. Patient interacts well with others on the unit.  Patient remains safe at this time.

## 2018-03-22 NOTE — Plan of Care (Signed)
Pt. Compliant with medications. Pt. Denies Si/Hi and verbally is able to contract for safety. Pt. Participates good in groups and unit activities. Pt. Able to remain safe while on the unit. Pt. Continues to present pressured in speech with flights of ideas and random outbursts of laughter and excitement.    Problem: Education: Goal: Emotional status will improve Outcome: Not Progressing Goal: Mental status will improve Outcome: Not Progressing   Problem: Health Behavior/Discharge Planning: Goal: Compliance with treatment plan for underlying cause of condition will improve Outcome: Progressing   Problem: Safety: Goal: Periods of time without injury will increase Outcome: Progressing   Problem: Activity: Goal: Interest or engagement in leisure activities will improve Outcome: Progressing   Problem: Health Behavior/Discharge Planning: Goal: Compliance with therapeutic regimen will improve Outcome: Progressing   Problem: Safety: Goal: Ability to disclose and discuss suicidal ideas will improve Outcome: Progressing   Problem: Safety: Goal: Ability to remain free from injury will improve Outcome: Progressing

## 2018-03-22 NOTE — Progress Notes (Signed)
[facility] MD Progress Note  03/23/2018 11:51 AM Novis League  MRN:  [id number]  Subjective:   There is not much change in Cynde's presentation. She is still manic. Platelets are up to 153 from 98 after Depakote discontinued but her bipolar acting up. Will start Tegretol for better control of bipolar.    Principal Problem: Bipolar I disorder, current or most recent episode manic, with psychotic features ([place]) Diagnosis:   Patient Active Problem List   Diagnosis Date Noted  . Bipolar I disorder, current or most recent episode manic, with psychotic features ([place]) [F31.2] 11/04/2017    Priority: High  . Altered mental status, unspecified [R41.82] 03/16/2018  . Hypotension [I95.9] 03/15/2018  . Rash of hands [R21] 02/18/2018  . Pain of right heel [M79.671] 12/30/2017  . Encounter for chronic pain management [G89.29] 12/18/2017  . Foot pain, bilateral [id number], M79.672] 11/25/2017  . Gallbladder polyp [K82.4] 10/21/2017  . Left shoulder pain [M25.512] 10/21/2017  . Benign neoplasm of ascending colon [D12.2]   . Low back pain radiating to left lower extremity [M54.5, M79.605] 02/26/2017  . Acquired hypothyroidism [E03.9] 09/10/2016  . Thrombocytopenia ([place]) [D69.6] 06/15/2016  . Medicare annual wellness visit, subsequent [Z00.00] 06/04/2016  . Advanced care planning/counseling discussion [Z71.89] 06/04/2016  . External hemorrhoid [K64.4] 04/02/2016  . Slow transit constipation [K59.01] 01/04/2016  . Bipolar I disorder, most recent episode (or current) manic ([place]) [F31.10] 12/03/2015  . NAFLD (nonalcoholic fatty liver disease) [K76.0] 03/28/2015  . Gallbladder calculus without cholecystitis [K80.20] 03/28/2015  . Gout [M10.9] 03/27/2015  . HCV antibody positive [R76.8] 03/27/2015  . History of bladder cancer [Z85.51] 03/27/2015  . History of bundle branch block [Z86.79] 03/27/2015  . Genetic testing [Z13.79] 02/28/2015  . Loss of memory [R41.3] 09/04/2014  . Controlled type 2  diabetes mellitus with diabetic nephropathy ([place]) [E11.21] 06/07/2014  . Hyperlipidemia [E78.5]   . Anemia in CKD (chronic kidney disease) [N18.9, D63.1] 01/26/2013  . Personal history of colonic adenomas and colon cancer [Z86.010] 11/11/2012  . Osteoarthritis of knee [M17.10] 10/02/2012  . OSA on CPAP [G47.33, Z99.89]   . CKD (chronic kidney disease) stage 4, GFR 15-29 ml/min (HCC) [N18.4]   . Severe obesity (BMI 35.0-39.9) with comorbidity ([place]) [E66.01]   . HTN (hypertension), benign [I10] 04/07/2012  . Hx of colon cancer, stage I [Z85.038] 08/05/2011   Total Time spent with patient: 20 minutes  Past Psychiatric History: bipolar disorder  Past Medical History:  Past Medical History:  Diagnosis Date  . Anemia in chronic kidney disease   . Bipolar depression [facility])    sees psychiatrist - psych admission 03/2015  . Bladder cancer ([place]) 1990's  . Blood transfusion without reported diagnosis 2009  . Cataract    left  . CKD (chronic kidney disease) stage 3, GFR 30-59 ml/min (HCC)   . Colon cancer ([place]) 1990's  . DJD (degenerative joint disease), lumbar    chronic lower back pain  . Enterocutaneous fistula 04/07/2012   completed PT 06/2012 ([facility])  . Family history of breast cancer   . Family history of colon cancer   . Family history of stomach cancer   . GERD (gastroesophageal reflux disease)   . History of bladder cancer 1997  . History of colon cancer    s/p surgery  . History of uterine cancer    s/p hysterectomy  . Hyperlipidemia   . Hypertension   . IDA (iron deficiency anemia) 01/2013   thought due to h/o polyps  . Obesity (BMI 30-39.9)   .  OSA on CPAP    6cm H2O  . Personal history of colonic adenomas and colon cancer 11/11/2012  . Positive hepatitis C antibody test 09/2014   but negative confirmatory testing  . RBBB   . Uncontrolled type 2 diabetes mellitus with nephropathy ([place]) 06/07/2014   completed DSME 02/2016    Past Surgical History:  Procedure  Laterality Date  . BREAST BIOPSY Right 01/2014   fibroadenoma  . COLONOSCOPY  07/2009  . COLONOSCOPY  11/2012   2 tubular adenomas, mild diverticulosis, pending genetic testing for Lynch syndrome [name]) rpt 2 yrs  . COLONOSCOPY  02/2015   TA, diverticulosis, rpt 2 yrs [name])  . COLONOSCOPY WITH PROPOFOL N/A 06/09/2017   TAx1, rpt 2 yrs [name], [name], MD)  . DEXA  12/2009   WNL  . DOBUTAMINE STRESS ECHO  12/2009   no evidence of ischemia  . HERNIA REPAIR  02/04/12  . i & d abdominal wound  02/19/12  . LEFT OOPHORECTOMY  2005  . PARTIAL COLECTOMY  about 2008   for colon cancer  . PARTIAL HYSTERECTOMY  1981   uterine cancer, R ovary remains  . Reexploration of abdominal wound and Allograft placemet  02/26/12  . Removal of infected mesh and abdominal wound vac placement  02/24/12  . sleep study  02/2014   OSA - AHI 55, nadir 81% [name])   Family History:  Family History  Problem Relation Age of Onset  . Colon cancer Mother 80  . Stomach cancer Mother        dx in her 59s?  . CAD Father        MI; deceased 49  . Mental illness Sister        anxiety/depression  . Thyroid disease Sister   . Breast cancer Maternal Grandmother        age at dx unknown  . Diabetes Brother   . Diabetes Brother   . Diabetes Other        aunts and uncles both sides  . Arthritis Other        strong fmhx  . Colon cancer Other 34       maternal half-sister; deceased   Family Psychiatric  History: sister with developmental disability Social History:  Social History   Substance and Sexual Activity  Alcohol Use No  . Alcohol/week: 0.0 standard drinks     Social History   Substance and Sexual Activity  Drug Use No    Social History   Socioeconomic History  . Marital status: Single    Spouse name: Not on file  . Number of children: 0  . Years of education: Not on file  . Highest education level: Not on file  Occupational History  . Not on file  Social Needs  . Financial resource strain:  Not on file  . Food insecurity:    Worry: Not on file    Inability: Not on file  . Transportation needs:    Medical: Not on file    Non-medical: Not on file  Tobacco Use  . Smoking status: Never Smoker  . Smokeless tobacco: Never Used  Substance and Sexual Activity  . Alcohol use: No    Alcohol/week: 0.0 standard drinks  . Drug use: No  . Sexual activity: Never  Lifestyle  . Physical activity:    Days per week: Not on file    Minutes per session: Not on file  . Stress: Not on file  Relationships  . Social connections:  Talks on phone: Not on file    Gets together: Not on file    Attends religious service: Not on file    Active member of club or organization: Not on file    Attends meetings of clubs or organizations: Not on file    Relationship status: Not on file  Other Topics Concern  . Not on file  Social History Narrative   Lives with sister, no pets   Occupation: disabled, for bipolar and arthritis   Edu: GED   Activity: take walks   Diet: good water, vegetables daily   Religion: Catholic      Psych - [employer], Dr. Jimmye Norman (ph [phone])   Additional Social History:                         Sleep: Fair  Appetite:  Fair  Current Medications: Current Facility-Administered Medications  Medication Dose Route Frequency Provider Last Rate Last Dose  . acetaminophen (TYLENOL) tablet 650 mg  650 mg Oral Q6H PRN Averyana Pillars B, MD   650 mg at 03/22/18 1858  . alum & mag hydroxide-simeth (MAALOX/MYLANTA) 200-200-20 MG/5ML suspension 30 mL  30 mL Oral Q4H PRN Chauntelle Azpeitia B, MD   30 mL at 03/20/18 1009  . amantadine (SYMMETREL) capsule 100 mg  100 mg Oral BID Devlin Brink B, MD   100 mg at 03/23/18 0810  . bisacodyl (DULCOLAX) EC tablet 5 mg  5 mg Oral Daily PRN Yemaya Barnier B, MD   5 mg at 03/18/18 0333  . docusate sodium (COLACE) capsule 100 mg  100 mg Oral BID Saryah Loper B, MD   100 mg at 03/23/18 0810   . hydrocortisone (ANUSOL-HC) 2.5 % rectal cream   Rectal QID Shaun Zuccaro B, MD      . lamoTRIgine (LAMICTAL) tablet 200 mg  200 mg Oral QHS Keymani Mclean B, MD   200 mg at 03/22/18 2154  . levothyroxine (SYNTHROID, LEVOTHROID) tablet 50 mcg  50 mcg Oral QAC breakfast Findley Blankenbaker B, MD   50 mcg at 03/23/18 0810  . linagliptin (TRADJENTA) tablet 5 mg  5 mg Oral Daily Jurnee Nakayama B, MD   5 mg at 03/23/18 0812  . losartan (COZAAR) tablet 50 mg  50 mg Oral Daily Naureen Benton B, MD   50 mg at 03/23/18 0812  . magnesium hydroxide (MILK OF MAGNESIA) suspension 30 mL  30 mL Oral Daily PRN Dailee Manalang B, MD   30 mL at 03/18/18 0333  . metoprolol tartrate (LOPRESSOR) tablet 12.5 mg  12.5 mg Oral BID Jerie Basford B, MD   12.5 mg at 03/23/18 0810  . [START ON 04/06/2018] paliperidone (INVEGA SUSTENNA) injection 234 mg  234 mg Intramuscular Q28 days Vergia Chea B, MD      . polyethylene glycol (MIRALAX / GLYCOLAX) packet 17 g  17 g Oral Daily Hooria Gasparini B, MD   17 g at 03/23/18 0809  . QUEtiapine (SEROQUEL) tablet 300 mg  300 mg Oral QHS Zakariah Dejarnette B, MD   300 mg at 03/22/18 2154  . simethicone (MYLICON) chewable tablet 80 mg  80 mg Oral QID Zariana Strub B, MD   80 mg at 03/23/18 1137  . simvastatin (ZOCOR) tablet 20 mg  20 mg Oral q1800 Saman Umstead B, MD   20 mg at 03/22/18 1718    Lab Results:  Results for orders placed or performed during the hospital encounter of 03/16/18 (  from the past 48 hour(s))  Glucose, capillary     Status: Abnormal   Collection Time: 03/22/18  7:03 AM  Result Value Ref Range   Glucose-Capillary 120 (H) 70 - 99 mg/dL  Glucose, capillary     Status: Abnormal   Collection Time: 03/23/18  7:12 AM  Result Value Ref Range   Glucose-Capillary 115 (H) 70 - 99 mg/dL   Comment 1 Notify RN   CBC     Status: Abnormal   Collection Time: 03/23/18  7:52 AM  Result Value Ref Range   WBC 6.7 3.6 -  11.0 K/uL   RBC 3.17 (L) 3.80 - 5.20 MIL/uL   Hemoglobin 11.0 (L) 12.0 - 16.0 g/dL   HCT 30.5 (L) 35.0 - 47.0 %   MCV 96.2 80.0 - 100.0 fL   MCH 34.7 (H) 26.0 - 34.0 pg   MCHC 36.1 (H) 32.0 - 36.0 g/dL   RDW 14.4 11.5 - 14.5 %   Platelets 153 150 - 440 K/uL    Comment: Performed at [facility], [address] [place], [place] [address]  Comprehensive metabolic panel     Status: Abnormal   Collection Time: 03/23/18  7:52 AM  Result Value Ref Range   Sodium 139 135 - 145 mmol/L   Potassium 4.7 3.5 - 5.1 mmol/L   Chloride 104 98 - 111 mmol/L   CO2 27 22 - 32 mmol/L   Glucose, Bld 118 (H) 70 - 99 mg/dL   BUN 41 (H) 8 - 23 mg/dL   Creatinine, Ser 1.93 (H) 0.44 - 1.00 mg/dL   Calcium 9.3 8.9 - 10.3 mg/dL   Total Protein 6.9 6.5 - 8.1 g/dL   Albumin 4.0 3.5 - 5.0 g/dL   AST 23 15 - 41 U/L   ALT 32 0 - 44 U/L   Alkaline Phosphatase 87 38 - 126 U/L   Total Bilirubin 0.5 0.3 - 1.2 mg/dL   GFR calc non Af Amer 26 (L) >60 mL/min   GFR calc Af Amer 30 (L) >60 mL/min    Comment: (NOTE) The eGFR has been calculated using the CKD EPI equation. This calculation has not been validated in all clinical situations. eGFR's persistently <60 mL/min signify possible Chronic Kidney Disease.    Anion gap 8 5 - 15    Comment: Performed at [facility], [address] [name], [place] [address]    Blood Alcohol level:  Lab Results  Component Value Date   [facility] <10 03/11/2018   ETH <10 70/35/0093    Metabolic Disorder Labs: Lab Results  Component Value Date   HGBA1C 6.6 (H) 02/18/2018   MPG 122.63 11/14/2017   MPG 114 04/14/2012   Lab Results  Component Value Date   PROLACTIN 33.6 (H) 12/04/2015   Lab Results  Component Value Date   CHOL 140 11/14/2017   TRIG 152 (H) 11/14/2017   HDL 50 11/14/2017   CHOLHDL 2.8 11/14/2017   VLDL 30 11/14/2017   LDLCALC 60 11/14/2017   LDLCALC 81 09/30/2017    Physical Findings: AIMS:  , ,  ,  ,    CIWA:    COWS:      Musculoskeletal: Strength & Muscle Tone: within normal limits Gait & Station: normal Patient leans: N/A  Psychiatric Specialty Exam: Physical Exam  Nursing note and vitals reviewed. Psychiatric: Her affect is labile and inappropriate. Her speech is rapid and/or pressured. She is hyperactive. Thought content is delusional. Cognition and memory are impaired. She  expresses impulsivity.    Review of Systems  Neurological: Negative.   Psychiatric/Behavioral: The patient has insomnia.   All other systems reviewed and are negative.   Blood pressure 135/75, pulse 66, temperature 97.6 F (36.4 C), temperature source Oral, resp. rate 17, height 5' 2"  (1.575 m), weight 87.1 kg, SpO2 100 %.Body mass index is 35.12 kg/m.  General Appearance: Casual  Eye Contact:  Good  Speech:  Clear and Coherent and Pressured  Volume:  Increased  Mood:  Euphoric  Affect:  Congruent  Thought Process:  Goal Directed and Descriptions of Associations: Tangential  Orientation:  Full (Time, Place, and Person)  Thought Content:  Delusions  Suicidal Thoughts:  No  Homicidal Thoughts:  No  Memory:  Immediate;   Fair Recent;   Fair Remote;   Fair  Judgement:  Fair  Insight:  Present  Psychomotor Activity:  Increased  Concentration:  Concentration: Fair and Attention Span: Fair  Recall:  [employer] of Knowledge:  Fair  Language:  Fair  Akathisia:  No  Handed:  Right  AIMS (if indicated):     Assets:  Communication Skills Desire for Improvement Financial Resources/Insurance Housing Physical Health Resilience Social Support  ADL's:  Intact  Cognition:  WNL  Sleep:  Number of Hours: 6.45     Treatment Plan Summary: Daily contact with patient to assess and evaluate symptoms and progress in treatment and Medication management   Ms. Marksberry is a 67 year old female with a history of bipolar disorder admitted for a manic episode in the context of medication changes.She had severe insomnia and was  given 30 mg of Restoril tonight causinghypotonia and was briefly hospitalized at ICU. She is still hypomanic. She tolerates transition from Depakote to Lamictal well but remains hypomanic.  #Bipolar disorder, hypomania -continue Invega sustenna injection 234 mg every 28 days, next dose on 9/3 -continue Seroquel 300 mg nightlyfor mood stabilization, anxiety and depression -completed Depakote taper due to thrombocytopenia -continue Lamictal to 200 mg nightly -consider addition of Tegretol  #Thrombocytopenia -Patient is being tapered off of Depakoteand started on Lamictal -check CBC in AM  #Insomnia -Melatonin 10 mg nightly  #Sleep apnea -CPAP nightly  #Hypothyroidism -Continue Synthroid 50 mcg p.o. daily  #HTN -Continue metoprolol 12.5 mg p.o. twice daily and Cozaar 50 mg p.o. daily  #DM -Tradjenta 5 mg daily -SSI, CBG, ADA diet  #Constipation -bowel regimen -Anusol PRN  #Dislipidemia  -Zocor 20 mg daily  #Labs -Hemoglobin A1c was 6.6 and total cholesterol was 140  #Disposition -discharge to home with the sister -follow up with [facility]   Orson Slick, MD 03/23/2018, 11:51 AM

## 2018-03-22 NOTE — Plan of Care (Signed)
Patient verbalizes understanding of the general information that's been provided to her and all questions/concerns have been addressed and answered at this time. Patient denies SI/HI/AVH stating "no, I don't believe in that stuff" as well as any signs/symptoms of depression/anxiety at this time stating "no, I like to be happy". Patient continues to show interest in unit activities by attending/participating in unit groups as well as going outside to the courtyard with staff an dthe other members on the unit without any issues at this time. Patient has the ability to verbalize her anger and frustrations in an appropriate manner and has been demonstrating self-control on the unit. Patient verbalizes understanding and has been in compliance with her prescribed therapeutic/medication regimen. Patient has been free from injury and remains safe on the unit at this time.  Problem: Education: Goal: Knowledge of [place] [facility] Education information/materials will improve Outcome: Progressing Goal: Emotional status will improve Outcome: Progressing Goal: Mental status will improve Outcome: Progressing Goal: Verbalization of understanding the information provided will improve Outcome: Progressing   Problem: Activity: Goal: Interest or engagement in activities will improve Outcome: Progressing Goal: Sleeping patterns will improve Outcome: Progressing   Problem: Coping: Goal: Ability to verbalize frustrations and anger appropriately will improve Outcome: Progressing Goal: Ability to demonstrate self-control will improve Outcome: Progressing   Problem: Health Behavior/Discharge Planning: Goal: Compliance with treatment plan for underlying cause of condition will improve Outcome: Progressing   Problem: Safety: Goal: Periods of time without injury will increase Outcome: Progressing   Problem: Activity: Goal: Interest or engagement in leisure activities will improve Outcome: Progressing Goal:  Imbalance in normal sleep/wake cycle will improve Outcome: Progressing   Problem: Health Behavior/Discharge Planning: Goal: Ability to make decisions will improve Outcome: Progressing Goal: Compliance with therapeutic regimen will improve Outcome: Progressing   Problem: Safety: Goal: Ability to disclose and discuss suicidal ideas will improve Outcome: Progressing Goal: Ability to identify and utilize support systems that promote safety will improve Outcome: Progressing   Problem: Safety: Goal: Ability to remain free from injury will improve Outcome: Progressing

## 2018-03-23 LAB — CBC
HEMATOCRIT: 30.5 % — AB (ref 35.0–47.0)
Hemoglobin: 11 g/dL — ABNORMAL LOW (ref 12.0–16.0)
MCH: 34.7 pg — AB (ref 26.0–34.0)
MCHC: 36.1 g/dL — AB (ref 32.0–36.0)
MCV: 96.2 fL (ref 80.0–100.0)
Platelets: 153 10*3/uL (ref 150–440)
RBC: 3.17 MIL/uL — ABNORMAL LOW (ref 3.80–5.20)
RDW: 14.4 % (ref 11.5–14.5)
WBC: 6.7 10*3/uL (ref 3.6–11.0)

## 2018-03-23 LAB — COMPREHENSIVE METABOLIC PANEL
ALK PHOS: 87 U/L (ref 38–126)
ALT: 32 U/L (ref 0–44)
ANION GAP: 8 (ref 5–15)
AST: 23 U/L (ref 15–41)
Albumin: 4 g/dL (ref 3.5–5.0)
BILIRUBIN TOTAL: 0.5 mg/dL (ref 0.3–1.2)
BUN: 41 mg/dL — ABNORMAL HIGH (ref 8–23)
CALCIUM: 9.3 mg/dL (ref 8.9–10.3)
CO2: 27 mmol/L (ref 22–32)
Chloride: 104 mmol/L (ref 98–111)
Creatinine, Ser: 1.93 mg/dL — ABNORMAL HIGH (ref 0.44–1.00)
GFR calc non Af Amer: 26 mL/min — ABNORMAL LOW (ref >60)
GFR, EST AFRICAN AMERICAN: 30 mL/min — AB (ref >60)
Glucose, Bld: 118 mg/dL — ABNORMAL HIGH (ref 70–99)
POTASSIUM: 4.7 mmol/L (ref 3.5–5.1)
SODIUM: 139 mmol/L (ref 135–145)
TOTAL PROTEIN: 6.9 g/dL (ref 6.5–8.1)

## 2018-03-23 LAB — GLUCOSE, CAPILLARY: GLUCOSE-CAPILLARY: 115 mg/dL — AB (ref 70–99)

## 2018-03-23 MED ORDER — CARBAMAZEPINE 200 MG PO TABS
200.0000 mg | ORAL_TABLET | Freq: Two times a day (BID) | ORAL | Status: DC
  Administered 2018-03-23 – 2018-04-05 (×26): 200 mg via ORAL
  Filled 2018-03-23 (×24): qty 1

## 2018-03-23 NOTE — Progress Notes (Signed)
Patient ID: Gina [name], female   DOB: Mar 14, 1951, 67 y.o.   MRN: [id number] CSW received call from Pt's sister who expressed concerns about Pt's behavior on the phone. She shares that she hears her laughing inappropriately and that she seems to be confused or not able to remember things.  CSW assured her that staff is aware that Pt is still manic and that these symptoms are not uncommon for her condition and that we as a treatment team would continue to monitor her symptoms and progress.  Thanked her for her input and asked her to keep in touch with any future updates.  Gina Arbour, LCSW

## 2018-03-23 NOTE — [facility] Group Notes (Signed)
LCSW Group Therapy Note   03/22/2018 1:00pm   Type of Therapy and Topic:  Group Therapy:  Overcoming Obstacles   Participation Level:  Minimal   Description of Group:    In this group patients will be encouraged to explore what they see as obstacles to their own wellness and recovery. They will be guided to discuss their thoughts, feelings, and behaviors related to these obstacles. The group will process together ways to cope with barriers, with attention given to specific choices patients can make. Each patient will be challenged to identify changes they are motivated to make in order to overcome their obstacles. This group will be process-oriented, with patients participating in exploration of their own experiences as well as giving and receiving support and challenge from other group members.   Therapeutic Goals: 1. Patient will identify personal and current obstacles as they relate to admission. 2. Patient will identify barriers that currently interfere with their wellness or overcoming obstacles.  3. Patient will identify feelings, thought process and behaviors related to these barriers. 4. Patient will identify two changes they are willing to make to overcome these obstacles:      Summary of Patient Progress Pt able to attend and participated in some of group conversation.  Requiring redirection at times when becoming tangential.  At times says shes not sure of answers to questions posed to the group and says she is confused or can't remember like she used to. She overall presents more calm.  Still laughing inappropriately at times.     Therapeutic Modalities:   Cognitive Behavioral Therapy Solution Focused Therapy Motivational Interviewing Relapse Prevention Therapy  August Saucer, LCSW 03/23/2018 1:19 PM

## 2018-03-23 NOTE — [facility] Group Notes (Signed)
03/23/2018 1PM  Type of Therapy/Topic:  Group Therapy:  Feelings about Diagnosis  Participation Level:  Did Not Attend   Description of Group:   This group will allow patients to explore their thoughts and feelings about diagnoses they have received. Patients will be guided to explore their level of understanding and acceptance of these diagnoses. Facilitator will encourage patients to process their thoughts and feelings about the reactions of others to their diagnosis and will guide patients in identifying ways to discuss their diagnosis with significant others in their lives. This group will be process-oriented, with patients participating in exploration of their own experiences, giving and receiving support, and processing challenge from other group members.   Therapeutic Goals: 1. Patient will demonstrate understanding of diagnosis as evidenced by identifying two or more symptoms of the disorder 2. Patient will be able to express two feelings regarding the diagnosis 3. Patient will demonstrate their ability to communicate their needs through discussion and/or role play  Summary of Patient Progress: Patient was encouraged and invited to attend group. Patient did not attend group. Social worker will continue to encourage group participation in the future.        Therapeutic Modalities:   Cognitive Behavioral Therapy Brief Therapy Feelings Identification    Darin Engels, LCSW 03/23/2018 2:18 PM

## 2018-03-23 NOTE — Progress Notes (Signed)
Recreation Therapy Notes  Date: 03/23/2018  Time: 9:30 am  Location: Craft Room  Behavioral response: Appropriate  Intervention Topic: Self-Care  Discussion/Intervention:  Group content today was focused on Self-Care. The group defined self-care and some positive ways they care for themselves. Individuals expressed ways and reasons why they neglected any self-care in the past. Patients described ways to improve self-care in the future. The group explained what could happen if they did not do any self-care activities at all. The group participated in the intervention "self-care assessment" where they had a chance to discover some of their weaknesses and strengths in self- care. Patient came up with a self-care plan to improve themselves in the future.  . Clinical Observations/Feedback:  Patient came to group and defined self-care as the way you take care of  Yourself. She identified praying as something she does to take care of herself.  Markiyah Gahm LRT/CTRS         Quamel Fitzmaurice 03/23/2018 10:35 AM

## 2018-03-23 NOTE — Progress Notes (Signed)
D: Patient stated slept good last night .Stated appetite is good and energy level  Is normal. Stated concentration is good . Stated on Depression  Scale 0 , hopeless 0 and anxiety 0 .( low 0-10 high) Denies suicidal  homicidal ideations  .  No auditory hallucinations  No pain concerns . Appropriate ADL'S. Interacting with peers and staff.  Noted very somatic Patient able to verbalize understanding of information received . Emotional and mental status improved  Continue to get  disorganized . Re instructed  on information given , Affect cheerful  in activities   on unit . Voice  no concerns  around  sleep. Continue to work on  Radiographer, therapeutic  and decisions. No concerns around  self control. No safety concerns . Denies suicidal ideations .  A: Encourage patient participation with unit programming . Instruction  Given on  Medication , verbalize understanding. R: Voice no other concerns. Staff continue to monitor

## 2018-03-23 NOTE — Plan of Care (Signed)
Patient able to verbalize understanding of information  received .  Emotional and mental status improved  Continue to get  disorganized . Re instructed  on information given , Affect cheerful  in activities   on unit . Voice  no concerns  around  sleep. Continue to work on  Radiographer, therapeutic  and decisions. No concerns around  self control. No safety concerns . Denies suicidal ideations .   Problem: Education: Goal: Knowledge of [facility] Education information/materials will improve 03/23/2018 1432 by Leodis Liverpool, RN Outcome: Progressing 03/23/2018 1352 by Leodis Liverpool, RN Outcome: Progressing Goal: Emotional status will improve 03/23/2018 1432 by Leodis Liverpool, RN Outcome: Progressing 03/23/2018 1352 by Leodis Liverpool, RN Outcome: Progressing Goal: Mental status will improve 03/23/2018 1432 by Leodis Liverpool, RN Outcome: Progressing 03/23/2018 1352 by Leodis Liverpool, RN Outcome: Progressing Goal: Verbalization of understanding the information provided will improve 03/23/2018 1432 by Leodis Liverpool, RN Outcome: Progressing 03/23/2018 1352 by Leodis Liverpool, RN Outcome: Progressing   Problem: Activity: Goal: Interest or engagement in activities will improve 03/23/2018 1432 by Leodis Liverpool, RN Outcome: Progressing 03/23/2018 1352 by Leodis Liverpool, RN Outcome: Progressing Goal: Sleeping patterns will improve 03/23/2018 1432 by Leodis Liverpool, RN Outcome: Progressing 03/23/2018 1352 by Leodis Liverpool, RN Outcome: Progressing   Problem: Coping: Goal: Ability to verbalize frustrations and anger appropriately will improve 03/23/2018 1432 by Leodis Liverpool, RN Outcome: Progressing 03/23/2018 1352 by Leodis Liverpool, RN Outcome: Progressing Goal: Ability to demonstrate self-control will improve 03/23/2018 1432 by Leodis Liverpool, RN Outcome: Progressing 03/23/2018 1352 by Leodis Liverpool, RN Outcome: Progressing   Problem: Health Behavior/Discharge Planning: Goal:  Compliance with treatment plan for underlying cause of condition will improve 03/23/2018 1432 by Leodis Liverpool, RN Outcome: Progressing 03/23/2018 1352 by Leodis Liverpool, RN Outcome: Progressing   Problem: Safety: Goal: Periods of time without injury will increase 03/23/2018 1432 by Leodis Liverpool, RN Outcome: Progressing 03/23/2018 1352 by Leodis Liverpool, RN Outcome: Progressing   Problem: Activity: Goal: Interest or engagement in leisure activities will improve 03/23/2018 1432 by Leodis Liverpool, RN Outcome: Progressing 03/23/2018 1352 by Leodis Liverpool, RN Outcome: Progressing Goal: Imbalance in normal sleep/wake cycle will improve 03/23/2018 1432 by Leodis Liverpool, RN Outcome: Progressing 03/23/2018 1352 by Leodis Liverpool, RN Outcome: Progressing   Problem: Health Behavior/Discharge Planning: Goal: Ability to make decisions will improve 03/23/2018 1432 by Leodis Liverpool, RN Outcome: Progressing 03/23/2018 1352 by Leodis Liverpool, RN Outcome: Progressing Goal: Compliance with therapeutic regimen will improve 03/23/2018 1432 by Leodis Liverpool, RN Outcome: Progressing 03/23/2018 1352 by Leodis Liverpool, RN Outcome: Progressing   Problem: Safety: Goal: Ability to disclose and discuss suicidal ideas will improve 03/23/2018 1432 by Leodis Liverpool, RN Outcome: Progressing 03/23/2018 1352 by Leodis Liverpool, RN Outcome: Progressing Goal: Ability to identify and utilize support systems that promote safety will improve 03/23/2018 1432 by Leodis Liverpool, RN Outcome: Progressing 03/23/2018 1352 by Leodis Liverpool, RN Outcome: Progressing   Problem: Safety: Goal: Ability to remain free from injury will improve 03/23/2018 1432 by Leodis Liverpool, RN Outcome: Progressing 03/23/2018 1352 by Leodis Liverpool, RN Outcome: Progressing

## 2018-03-23 NOTE — Progress Notes (Signed)
[name] Progress Note  03/24/2018 11:29 AM Gina [name]  MRN:  [id number]  Subjective:    Gina [name] continues in her expansive, euphoric mood. She has been giggling, laughing, and cracking jokes all the time. This worries her sister to no end. She also complains of poor sleep last night. She has been using her CPAP machine.    I spoke with Gina [name], her primary psychiatrist. The plan was to discontinue Depakote and increase Lamictal to 200 mg nightly. Manic symptoms continued. Next step is to add Tegretol. We started that. We will need to increase Lamictal dose if she continue on Tegretol. She has been on multiple medications and it is difficult to find substitute for Depakote. Her platelets are better.    Principal Problem: Bipolar I disorder, current or most recent episode manic, with psychotic features (Gina [name]) Diagnosis:   Patient Active Problem List   Diagnosis Date Noted  . Bipolar I disorder, current or most recent episode manic, with psychotic features (Gina [name]) [F31.2] 11/04/2017    Priority: High  . Altered mental status, unspecified [R41.82] 03/16/2018  . Hypotension [I95.9] 03/15/2018  . Rash of hands [R21] 02/18/2018  . Pain of right heel [M79.671] 12/30/2017  . Encounter for chronic pain management [G89.29] 12/18/2017  . Foot pain, bilateral [id number], M79.672] 11/25/2017  . Gallbladder polyp [K82.4] 10/21/2017  . Left shoulder pain [M25.512] 10/21/2017  . Benign neoplasm of ascending colon [D12.2]   . Low back pain radiating to left lower extremity [M54.5, M79.605] 02/26/2017  . Acquired hypothyroidism [E03.9] 09/10/2016  . Thrombocytopenia ([place]) [D69.6] 06/15/2016  . Medicare annual wellness visit, subsequent [Z00.00] 06/04/2016  . Advanced care planning/counseling discussion [Z71.89] 06/04/2016  . External hemorrhoid [K64.4] 04/02/2016  . Slow transit constipation [K59.01] 01/04/2016  . Bipolar I disorder, most recent episode (or current) manic (Gina [name])  [F31.10] 12/03/2015  . NAFLD (nonalcoholic fatty liver disease) [K76.0] 03/28/2015  . Gallbladder calculus without cholecystitis [K80.20] 03/28/2015  . Gout [M10.9] 03/27/2015  . HCV antibody positive [R76.8] 03/27/2015  . History of bladder cancer [Z85.51] 03/27/2015  . History of bundle branch block [Z86.79] 03/27/2015  . Genetic testing [Z13.79] 02/28/2015  . Loss of memory [R41.3] 09/04/2014  . Controlled type 2 diabetes mellitus with diabetic nephropathy ([place]) [E11.21] 06/07/2014  . Hyperlipidemia [E78.5]   . Anemia in CKD (chronic kidney disease) [N18.9, D63.1] 01/26/2013  . Personal history of colonic adenomas and colon cancer [Z86.010] 11/11/2012  . Osteoarthritis of knee [M17.10] 10/02/2012  . OSA on CPAP [G47.33, Z99.89]   . CKD (chronic kidney disease) stage 4, GFR 15-29 ml/min (HCC) [N18.4]   . Severe obesity (BMI 35.0-39.9) with comorbidity ([place]) [E66.01]   . HTN (hypertension), benign [I10] 04/07/2012  . Hx of colon cancer, stage I [Z85.038] 08/05/2011   Total Time spent with patient: 20 minutes  Past Psychiatric History: bipolar disorder  Past Medical History:  Past Medical History:  Diagnosis Date  . Anemia in chronic kidney disease   . Bipolar depression Gina [name])    sees psychiatrist - psych admission 03/2015  . Bladder cancer ([place]) 1990's  . Blood transfusion without reported diagnosis 2009  . Cataract    left  . CKD (chronic kidney disease) stage 3, GFR 30-59 ml/min (HCC)   . Colon cancer ([place]) 1990's  . DJD (degenerative joint disease), lumbar    chronic lower back pain  . Enterocutaneous fistula 04/07/2012   completed PT 06/2012 (Gina [name])  . Family history of breast cancer   . Family history of  colon cancer   . Family history of stomach cancer   . GERD (gastroesophageal reflux disease)   . History of bladder cancer 1997  . History of colon cancer    s/p surgery  . History of uterine cancer    s/p hysterectomy  . Hyperlipidemia   . Hypertension   .  IDA (iron deficiency anemia) 01/2013   thought due to h/o polyps  . Obesity (BMI 30-39.9)   . OSA on CPAP    6cm H2O  . Personal history of colonic adenomas and colon cancer 11/11/2012  . Positive hepatitis C antibody test 09/2014   but negative confirmatory testing  . RBBB   . Uncontrolled type 2 diabetes mellitus with nephropathy ([place]) 06/07/2014   completed DSME 02/2016    Past Surgical History:  Procedure Laterality Date  . BREAST BIOPSY Right 01/2014   fibroadenoma  . COLONOSCOPY  07/2009  . COLONOSCOPY  11/2012   2 tubular adenomas, mild diverticulosis, pending genetic testing for Lynch syndrome Gina [name]) rpt 2 yrs  . COLONOSCOPY  02/2015   TA, diverticulosis, rpt 2 yrs Gina [name])  . COLONOSCOPY WITH PROPOFOL N/A 06/09/2017   TAx1, rpt 2 yrs Gina [name], Gina [name])  . DEXA  12/2009   WNL  . DOBUTAMINE STRESS ECHO  12/2009   no evidence of ischemia  . HERNIA REPAIR  02/04/12  . i & d abdominal wound  02/19/12  . LEFT OOPHORECTOMY  2005  . PARTIAL COLECTOMY  about 2008   for colon cancer  . PARTIAL HYSTERECTOMY  1981   uterine cancer, R ovary remains  . Reexploration of abdominal wound and Allograft placemet  02/26/12  . Removal of infected mesh and abdominal wound vac placement  02/24/12  . sleep study  02/2014   OSA - AHI 55, nadir 81% Gina [name])   Family History:  Family History  Problem Relation Age of Onset  . Colon cancer Mother 45  . Stomach cancer Mother        dx in her 70s?  . CAD Father        MI; deceased 84  . Mental illness Sister        anxiety/depression  . Thyroid disease Sister   . Breast cancer Maternal Grandmother        age at dx unknown  . Diabetes Brother   . Diabetes Brother   . Diabetes Other        aunts and uncles both sides  . Arthritis Other        strong fmhx  . Colon cancer Other 23       maternal half-sister; deceased   Family Psychiatric  History: sister with mental illness Social History:  Social History   Substance and Sexual Activity   Alcohol Use No  . Alcohol/week: 0.0 standard drinks     Social History   Substance and Sexual Activity  Drug Use No    Social History   Socioeconomic History  . Marital status: Single    Spouse name: Not on file  . Number of children: 0  . Years of education: Not on file  . Highest education level: Not on file  Occupational History  . Not on file  Social Needs  . Financial resource strain: Not on file  . Food insecurity:    Worry: Not on file    Inability: Not on file  . Transportation needs:    Medical: Not on file    Non-medical: Not on file  Tobacco  Use  . Smoking status: Never Smoker  . Smokeless tobacco: Never Used  Substance and Sexual Activity  . Alcohol use: No    Alcohol/week: 0.0 standard drinks  . Drug use: No  . Sexual activity: Never  Lifestyle  . Physical activity:    Days per week: Not on file    Minutes per session: Not on file  . Stress: Not on file  Relationships  . Social connections:    Talks on phone: Not on file    Gets together: Not on file    Attends religious service: Not on file    Active member of club or organization: Not on file    Attends meetings of clubs or organizations: Not on file    Relationship status: Not on file  Other Topics Concern  . Not on file  Social History Narrative   Lives with sister, no pets   Occupation: disabled, for bipolar and arthritis   Edu: GED   Activity: take walks   Diet: good water, vegetables daily   Religion: Catholic      Psych - [employer], Dr. Jimmye Norman (ph [phone])   Additional Social History:                         Sleep: Fair  Appetite:  Fair  Current Medications: Current Facility-Administered Medications  Medication Dose Route Frequency Provider Last Rate Last Dose  . acetaminophen (TYLENOL) tablet 650 mg  650 mg Oral Q6H PRN Vuk Skillern B, [name]   650 mg at 03/23/18 1319  . alum & mag hydroxide-simeth (MAALOX/MYLANTA) 200-200-20 MG/5ML  suspension 30 mL  30 mL Oral Q4H PRN Sabina Beavers B, [name]   30 mL at 03/20/18 1009  . amantadine (SYMMETREL) capsule 100 mg  100 mg Oral BID Brinlyn Cena B, [name]   100 mg at 03/24/18 0757  . bisacodyl (DULCOLAX) EC tablet 5 mg  5 mg Oral Daily PRN Dae Antonucci B, [name]   5 mg at 03/18/18 0333  . carbamazepine (TEGRETOL) tablet 200 mg  200 mg Oral BID AC & HS Montrice Gracey B, [name]   200 mg at 03/24/18 0757  . docusate sodium (COLACE) capsule 100 mg  100 mg Oral BID Haddie Bruhl B, [name]   100 mg at 03/24/18 0756  . hydrocortisone (ANUSOL-HC) 2.5 % rectal cream   Rectal QID Dejana Pugsley B, [name]      . lamoTRIgine (LAMICTAL) tablet 200 mg  200 mg Oral QHS Shondrea Steinert B, [name]   200 mg at 03/23/18 2147  . levothyroxine (SYNTHROID, LEVOTHROID) tablet 50 mcg  50 mcg Oral QAC breakfast Olufemi Mofield B, [name]   50 mcg at 03/24/18 0756  . linagliptin (TRADJENTA) tablet 5 mg  5 mg Oral Daily Rashmi Tallent B, [name]   5 mg at 03/24/18 0758  . losartan (COZAAR) tablet 50 mg  50 mg Oral Daily Chailyn Racette B, [name]   50 mg at 03/24/18 0758  . magnesium hydroxide (MILK OF MAGNESIA) suspension 30 mL  30 mL Oral Daily PRN Willow Shidler B, [name]   30 mL at 03/18/18 0333  . metoprolol tartrate (LOPRESSOR) tablet 12.5 mg  12.5 mg Oral BID Chastin Garlitz B, [name]   12.5 mg at 03/24/18 0756  . [START ON 04/06/2018] paliperidone (INVEGA SUSTENNA) injection 234 mg  234 mg Intramuscular Q28 days Maheen Cwikla B, [name]      . polyethylene glycol (MIRALAX / GLYCOLAX) packet 17  g  17 g Oral Daily Warner Laduca B, [name]   17 g at 03/24/18 0757  . QUEtiapine (SEROQUEL) tablet 400 mg  400 mg Oral QHS K[place]ton Rios B, [name]      . simethicone (MYLICON) chewable tablet 80 mg  80 mg Oral QID Laiana Fratus B, [name]   80 mg at 03/24/18 0756  . simvastatin (ZOCOR) tablet 20 mg  20 mg Oral q1800 Jermiyah Ricotta B, [name]   20 mg at 03/23/18 1910    Lab Results:  Results for  orders placed or performed during the [name] encounter of 03/16/18 (from the past 48 hour(s))  Glucose, capillary     Status: Abnormal   Collection Time: 03/23/18  7:12 AM  Result Value Ref Range   Glucose-Capillary 115 (H) 70 - 99 mg/dL   Comment 1 Notify RN   CBC     Status: Abnormal   Collection Time: 03/23/18  7:52 AM  Result Value Ref Range   WBC 6.7 3.6 - 11.0 K/uL   RBC 3.17 (L) 3.80 - 5.20 MIL/uL   Hemoglobin 11.0 (L) 12.0 - 16.0 g/dL   HCT 30.5 (L) 35.0 - 47.0 %   MCV 96.2 80.0 - 100.0 fL   MCH 34.7 (H) 26.0 - 34.0 pg   MCHC 36.1 (H) 32.0 - 36.0 g/dL   RDW 14.4 11.5 - 14.5 %   Platelets 153 150 - 440 K/uL    Comment: Performed at [facility], [address] [place], [place] [address]  Comprehensive metabolic panel     Status: Abnormal   Collection Time: 03/23/18  7:52 AM  Result Value Ref Range   Sodium 139 135 - 145 mmol/L   Potassium 4.7 3.5 - 5.1 mmol/L   Chloride 104 98 - 111 mmol/L   CO2 27 22 - 32 mmol/L   Glucose, Bld 118 (H) 70 - 99 mg/dL   BUN 41 (H) 8 - 23 mg/dL   Creatinine, Ser 1.93 (H) 0.44 - 1.00 mg/dL   Calcium 9.3 8.9 - 10.3 mg/dL   Total Protein 6.9 6.5 - 8.1 g/dL   Albumin 4.0 3.5 - 5.0 g/dL   AST 23 15 - 41 U/L   ALT 32 0 - 44 U/L   Alkaline Phosphatase 87 38 - 126 U/L   Total Bilirubin 0.5 0.3 - 1.2 mg/dL   GFR calc non Af Amer 26 (L) >60 mL/min   GFR calc Af Amer 30 (L) >60 mL/min    Comment: (NOTE) The eGFR has been calculated using the CKD EPI equation. This calculation has not been validated in all clinical situations. eGFR's persistently <60 mL/min signify possible Chronic Kidney Disease.    Anion gap 8 5 - 15    Comment: Performed at [facility], [address] [place], [place] [address]  Glucose, capillary     Status: Abnormal   Collection Time: 03/24/18  7:05 AM  Result Value Ref Range   Glucose-Capillary 110 (H) 70 - 99 mg/dL    Blood Alcohol level:  Lab Results  Component Value Date   ETH <10  03/11/2018   ETH <10 09/81/1914    Metabolic Disorder Labs: Lab Results  Component Value Date   HGBA1C 6.6 (H) 02/18/2018   MPG 122.63 11/14/2017   MPG 114 04/14/2012   Lab Results  Component Value Date   PROLACTIN 33.6 (H) 12/04/2015   Lab Results  Component Value Date   CHOL 140 11/14/2017   TRIG 152 (H) 11/14/2017  HDL 50 11/14/2017   CHOLHDL 2.8 11/14/2017   VLDL 30 11/14/2017   LDLCALC 60 11/14/2017   LDLCALC 81 09/30/2017    Physical Findings: AIMS:  , ,  ,  ,    CIWA:    COWS:     Musculoskeletal: Strength & Muscle Tone: within normal limits Gait & Station: normal Patient leans: N/A  Psychiatric Specialty Exam: Physical Exam  Nursing note and vitals reviewed. Psychiatric: Her affect is labile and inappropriate. Her speech is rapid and/or pressured. She is hyperactive. Thought content is paranoid and delusional. Cognition and memory are impaired. She expresses impulsivity.    Review of Systems  Neurological: Negative.   Psychiatric/Behavioral: The patient has insomnia.   All other systems reviewed and are negative.   Blood pressure (!) 138/91, pulse 89, temperature 97.7 F (36.5 C), temperature source Oral, resp. rate 18, height 5' 2"  (1.575 m), weight 87.1 kg, SpO2 100 %.Body mass index is 35.12 kg/m.  General Appearance: Casual  Eye Contact:  Good  Speech:  Pressured  Volume:  Increased  Mood:  Euphoric  Affect:  Congruent  Thought Process:  Disorganized, Irrelevant and Descriptions of Associations: Tangential  Orientation:  Full (Time, Place, and Person)  Thought Content:  Delusions and Paranoid Ideation  Suicidal Thoughts:  No  Homicidal Thoughts:  No  Memory:  Immediate;   Poor Recent;   Poor Remote;   Poor  Judgement:  Poor  Insight:  Lacking  Psychomotor Activity:  Increased  Concentration:  Concentration: Poor and Attention Span: Poor  Recall:  Poor  Fund of Knowledge:  Poor  Language:  Fair  Akathisia:  No  Handed:  Right  AIMS  (if indicated):     Assets:  Communication Skills Desire for Improvement Financial Resources/Insurance Housing Physical Health Resilience Social Support  ADL's:  Intact  Cognition:  WNL  Sleep:  Number of Hours: 7.3     Treatment Plan Summary: Daily contact with patient to assess and evaluate symptoms and progress in treatment and Medication management   Gina [name] is a 67 year old female with a history of bipolar disorder admitted for a manic episode in the context of medication changes.She had severe insomnia and was given 30 mg of Restoril tonight causinghypotonia and was briefly hospitalized at ICU. She is still hypomanic. She tolerates transition from Depakote to Lamictal well but remains hypomanic.  #Bipolar disorder, hypomania -continue Invega sustenna injection 234 mg every 28 days, next dose on 9/3 -increase Seroquel to 400 mg nightlyfor mood stabilization -completedDepakote taper due to thrombocytopenia -continue Lamictal to 200 mg nightly -start Tegretol 200 mg BID -consider switching to Geodon   #Thrombocytopenia -Patient is being tapered off of Depakoteand started on Lamictal -check CBC in AM  #Insomnia -Melatonin 10 mg nightly  #Sleep apnea -CPAP nightly  #Hypothyroidism -Continue Synthroid 50 mcg p.o. daily  #HTN -Continue metoprolol 12.5 mg p.o. twice daily and Cozaar 50 mg p.o. daily  #DM -Tradjenta 5 mg daily -SSI, CBG, ADA diet  #Constipation -bowel regimen -Anusol PRN  #Dislipidemia  -Zocor 20 mg daily  #Labs -Hemoglobin A1c was 6.6 and total cholesterol was 140  #Disposition -discharge to home with the sister -follow up with [facility]  Orson Slick, [name] 03/24/2018, 11:28 AM

## 2018-03-23 NOTE — [facility] Group Notes (Signed)
LCSW Group Therapy Note 03/23/2018 1:00pm  Type of Therapy and Topic:  Group Therapy:  Setting Goals  Participation Level:  Minimal  Description of Group: In this process group, patients discussed using strengths to work toward goals and address challenges.  Patients identified two positive things about themselves and one goal they were working on.  Patients were given the opportunity to share openly and support each other's plan for self-empowerment.  The group discussed the value of gratitude and were encouraged to have a daily reflection of positive characteristics or circumstances.  Patients were encouraged to identify a plan to utilize their strengths to work on current challenges and goals.  Therapeutic Goals 1. Patient will verbalize personal strengths/positive qualities and relate how these can assist with achieving desired personal goals 2. Patients will verbalize affirmation of peers plans for personal change and goal setting 3. Patients will explore the value of gratitude and positive focus as related to successful achievement of goals 4. Patients will verbalize a plan for regular reinforcement of personal positive qualities and circumstances.  Summary of Patient Progress: Pt attended but only shared that she wanted to get on the right medicine and get better.  She excused herself multiple times from group to use the restroom.      Therapeutic Modalities Cognitive Behavioral Therapy Motivational Interviewing    August Saucer, LCSW 03/23/2018 1:21 PM

## 2018-03-23 NOTE — Plan of Care (Signed)
Pt. Is complaint with medications this evening, besides not wanting her prescribed cream. Pt. Denies si/hi and verbally is able to contract for safety. Pt. Continues to present pressured in speech with flights of ideas, and periodic outbursts of laughter and whistling.    Problem: Health Behavior/Discharge Planning: Goal: Compliance with treatment plan for underlying cause of condition will improve Outcome: Progressing   Problem: Safety: Goal: Periods of time without injury will increase Outcome: Progressing   Problem: Safety: Goal: Ability to disclose and discuss suicidal ideas will improve Outcome: Progressing   Problem: Education: Goal: Emotional status will improve Outcome: Not Progressing Goal: Mental status will improve Outcome: Not Progressing

## 2018-03-23 NOTE — Tx Team (Addendum)
Interdisciplinary Treatment and Diagnostic Plan Update  03/22/2018 Time of Session: 10:50 AM Gina [name] MRN: [id number]  Principal Diagnosis: Bipolar I disorder, current or most recent episode manic, with psychotic features ([place])  Secondary Diagnoses: Principal Problem:   Bipolar I disorder, current or most recent episode manic, with psychotic features ([place]) Active Problems:   HTN (hypertension), benign   OSA on CPAP   Osteoarthritis of knee   Thrombocytopenia ([place])   Acquired hypothyroidism   Current Medications:  Current Facility-Administered Medications  Medication Dose Route Frequency Provider Last Rate Last Dose  . acetaminophen (TYLENOL) tablet 650 mg  650 mg Oral Q6H PRN Pucilowska, Jolanta B, MD   650 mg at 03/22/18 1858  . alum & mag hydroxide-simeth (MAALOX/MYLANTA) 200-200-20 MG/5ML suspension 30 mL  30 mL Oral Q4H PRN Pucilowska, Jolanta B, MD   30 mL at 03/20/18 1009  . amantadine (SYMMETREL) capsule 100 mg  100 mg Oral BID Pucilowska, Jolanta B, MD   100 mg at 03/23/18 0810  . bisacodyl (DULCOLAX) EC tablet 5 mg  5 mg Oral Daily PRN Pucilowska, Jolanta B, MD   5 mg at 03/18/18 0333  . docusate sodium (COLACE) capsule 100 mg  100 mg Oral BID Pucilowska, Jolanta B, MD   100 mg at 03/23/18 0810  . hydrocortisone (ANUSOL-HC) 2.5 % rectal cream   Rectal QID Pucilowska, Jolanta B, MD      . lamoTRIgine (LAMICTAL) tablet 200 mg  200 mg Oral QHS Pucilowska, Jolanta B, MD   200 mg at 03/22/18 2154  . levothyroxine (SYNTHROID, LEVOTHROID) tablet 50 mcg  50 mcg Oral QAC breakfast Pucilowska, Jolanta B, MD   50 mcg at 03/23/18 0810  . linagliptin (TRADJENTA) tablet 5 mg  5 mg Oral Daily Pucilowska, Jolanta B, MD   5 mg at 03/23/18 0812  . losartan (COZAAR) tablet 50 mg  50 mg Oral Daily Pucilowska, Jolanta B, MD   50 mg at 03/23/18 0812  . magnesium hydroxide (MILK OF MAGNESIA) suspension 30 mL  30 mL Oral Daily PRN Pucilowska, Jolanta B, MD   30 mL at 03/18/18 0333  .  metoprolol tartrate (LOPRESSOR) tablet 12.5 mg  12.5 mg Oral BID Pucilowska, Jolanta B, MD   12.5 mg at 03/23/18 0810  . [START ON 04/06/2018] paliperidone (INVEGA SUSTENNA) injection 234 mg  234 mg Intramuscular Q28 days Pucilowska, Jolanta B, MD      . polyethylene glycol (MIRALAX / GLYCOLAX) packet 17 g  17 g Oral Daily Pucilowska, Jolanta B, MD   17 g at 03/23/18 0809  . QUEtiapine (SEROQUEL) tablet 300 mg  300 mg Oral QHS Pucilowska, Jolanta B, MD   300 mg at 03/22/18 2154  . simethicone (MYLICON) chewable tablet 80 mg  80 mg Oral QID Pucilowska, Jolanta B, MD   80 mg at 03/23/18 1137  . simvastatin (ZOCOR) tablet 20 mg  20 mg Oral q1800 Pucilowska, Jolanta B, MD   20 mg at 03/22/18 1718   PTA Medications: Medications Prior to Admission  Medication Sig Dispense Refill Last Dose  . ACCU-CHEK AVIVA PLUS test strip 1 each by Other route daily. E11.21 100 each 3 Taking  . clotrimazole (LOTRIMIN) 1 % cream Apply 1 application topically 2 (two) times daily. Apply to toes   Past Month at Unknown time  . diclofenac sodium (VOLTAREN) 1 % GEL Apply 4 g topically 4 (four) times daily. To knees 5 Tube 5 Past Month at Unknown time  . divalproex (DEPAKOTE) 250 MG DR  tablet Take 250 mg by mouth 2 (two) times daily.   unknown at unknown  . docusate sodium (COLACE) 100 MG capsule Take 2 capsules (200 mg total) by mouth 2 (two) times daily. 60 capsule 1 Past Month at Unknown time  . GAVILAX powder TAKE 17 GRAMS BY MOUTH TWICE DAILY AS NEEDED FOR MODERATE CONSTIPATION. (Patient taking differently: Take 17 g by mouth 2 (two) times daily as needed for moderate constipation. ) 510 g 1 PRN at PRN  . lamoTRIgine (LAMICTAL) 25 MG tablet Take 2 tablets (50 mg total) by mouth 2 (two) times daily. (Patient taking differently: Take 50 mg by mouth every morning. ) 120 tablet 1 Past Month at Unknown time  . levothyroxine (SYNTHROID, LEVOTHROID) 50 MCG tablet Take 1 tablet (50 mcg total) by mouth daily before breakfast. 30  tablet 11 Past Month at Unknown time  . losartan (COZAAR) 50 MG tablet Take 1 tablet (50 mg total) by mouth daily. 30 tablet 11 Past Month at Unknown time  . metoprolol tartrate (LOPRESSOR) 25 MG tablet Take 0.5 tablets (12.5 mg total) by mouth 2 (two) times daily. 60 tablet 1 Past Month at Unknown time  . paliperidone (INVEGA SUSTENNA) 234 MG/1.5ML SUSP injection Inject 234 mg into the muscle every 30 (thirty) days.   Past Month at Unknown time  . QUEtiapine (SEROQUEL) 300 MG tablet Take 1 tablet (300 mg total) by mouth at bedtime.   Past Month at Unknown time  . simvastatin (ZOCOR) 20 MG tablet Take 1 tablet (20 mg total) by mouth at bedtime. 30 tablet 11 Past Month at Unknown time  . sitaGLIPtin (JANUVIA) 25 MG tablet Take 1 tablet (25 mg total) by mouth daily. 30 tablet 3 Past Month at Unknown time  . triamcinolone cream (KENALOG) 0.1 % Apply 1 application topically at bedtime. Apply to AA. 30 g 0 Past Month at Unknown time  . bisacodyl (DULCOLAX) 10 MG suppository Place 1 suppository (10 mg total) rectally daily as needed for moderate constipation. (Patient not taking: Reported on 03/16/2018) 12 suppository 0 Not Taking at Unknown time  . divalproex (DEPAKOTE) 500 MG DR tablet Take 1 tablet (500 mg total) by mouth 2 (two) times daily with a meal. (Patient not taking: Reported on 03/16/2018) 60 tablet 3 Not Taking at Unknown time  . senna (SENOKOT) 8.6 MG TABS tablet Take 1 tablet (8.6 mg total) by mouth at bedtime as needed for mild constipation. (Patient not taking: Reported on 03/16/2018) 120 each 0 Not Taking at Unknown time    Patient Stressors: Medication change or noncompliance  Patient Strengths: Ability for insight Motivation for treatment/growth Religious Affiliation Supportive family/friends  Treatment Modalities: Medication Management, Group therapy, Case management,  1 to 1 session with clinician, Psychoeducation, Recreational therapy.   Physician Treatment Plan for Primary  Diagnosis: Bipolar I disorder, current or most recent episode manic, with psychotic features ([place]) Long Term Goal(s): Improvement in symptoms so as ready for discharge NA   Short Term Goals: Ability to identify changes in lifestyle to reduce recurrence of condition will improve Ability to verbalize feelings will improve Ability to disclose and discuss suicidal ideas Ability to demonstrate self-control will improve Ability to identify and develop effective coping behaviors will improve Ability to maintain clinical measurements within normal limits will improve Ability to identify triggers associated with substance abuse/mental health issues will improve NA  Medication Management: Evaluate patient's response, side effects, and tolerance of medication regimen.  Therapeutic Interventions: 1 to 1 sessions, Unit Group sessions  and Medication administration.  Evaluation of Outcomes: Progressing  Physician Treatment Plan for Secondary Diagnosis: Principal Problem:   Bipolar I disorder, current or most recent episode manic, with psychotic features ([place]) Active Problems:   HTN (hypertension), benign   OSA on CPAP   Osteoarthritis of knee   Thrombocytopenia ([facility])   Acquired hypothyroidism  Long Term Goal(s): Improvement in symptoms so as ready for discharge NA   Short Term Goals: Ability to identify changes in lifestyle to reduce recurrence of condition will improve Ability to verbalize feelings will improve Ability to disclose and discuss suicidal ideas Ability to demonstrate self-control will improve Ability to identify and develop effective coping behaviors will improve Ability to maintain clinical measurements within normal limits will improve Ability to identify triggers associated with substance abuse/mental health issues will improve NA     Medication Management: Evaluate patient's response, side effects, and tolerance of medication regimen.  Therapeutic Interventions: 1 to 1  sessions, Unit Group sessions and Medication administration.  Evaluation of Outcomes: Progressing   RN Treatment Plan for Primary Diagnosis: Bipolar I disorder, current or most recent episode manic, with psychotic features ([place]) Long Term Goal(s): Knowledge of disease and therapeutic regimen to maintain health will improve  Short Term Goals: Ability to remain free from injury will improve, Ability to demonstrate self-control and Compliance with prescribed medications will improve  Medication Management: RN will administer medications as ordered by provider, will assess and evaluate patient's response and provide education to patient for prescribed medication. RN will report any adverse and/or side effects to prescribing provider.  Therapeutic Interventions: 1 on 1 counseling sessions, Psychoeducation, Medication administration, Evaluate responses to treatment, Monitor vital signs and CBGs as ordered, Perform/monitor CIWA, COWS, AIMS and Fall Risk screenings as ordered, Perform wound care treatments as ordered.  Evaluation of Outcomes: Progressing   LCSW Treatment Plan for Primary Diagnosis: Bipolar I disorder, current or most recent episode manic, with psychotic features ([place]) Long Term Goal(s): Safe transition to appropriate next level of care at discharge, Engage patient in therapeutic group addressing interpersonal concerns.  Short Term Goals: Engage patient in aftercare planning with referrals and resources and Increase skills for wellness and recovery  Therapeutic Interventions: Assess for all discharge needs, 1 to 1 time with Social worker, Explore available resources and support systems, Assess for adequacy in community support network, Educate family and significant other(s) on suicide prevention, Complete Psychosocial Assessment, Interpersonal group therapy.  Evaluation of Outcomes: Progressing   Progress in Treatment: Attending groups: No. Participating in groups: No. Taking  medication as prescribed: Yes. Toleration medication: Yes. Family/Significant other contact made:yes, sister Patient understands diagnosis: Yes. Discussing patient identified problems/goals with staff: Yes. Medical problems stabilized or resolved: Yes. Denies suicidal/homicidal ideation: Yes. Issues/concerns per patient self-inventory: No. Other: n/a  New problem(s) identified: No, Describe:  No new problems identified  New Short Term/Long Term Goal(s):  Patient Goals:  "get my medication right"  Discharge Plan or Barriers: home and follow up with the ACTT team.  Pt improving, but still laughing inappropriately, says that she feels confused at times. Seems slightly more calm  Reason for Continuation of Hospitalization: Mania Medication stabilization  Estimated Length of Stay: 5-7 days  Recreational Therapy: Patient Stressors: N/A Patient Goal: Patient will focus on task/topic with 2 prompts from staff within 5 recreation therapy group sessions  Attendees: Patient: 03/22/18  Physician: Gina Slick, MD 03/22/18  Nursing:  03/22/18  RN Care Manager: 03/22/18  Social Worker: Gina [name], [place] 03/22/18  Recreational Therapist:  Roanna [name], LRT 03/22/18  Other: Darin Engels, [place] 03/22/18  Other:    Other:      Scribe for Treatment Team: August Saucer, LCSW 03/23/2018 1:15 PM

## 2018-03-23 NOTE — Progress Notes (Signed)
D:Ptdenies SI/HI/AVH. Pt is pleasant and cooperative, but still very manic utilizing pressured speech, flights of ideas, and religous preoccupation. Pt. Denies pain this evening. Patient Interactionappropriate with staff and peers, but laughs very loudly to herself periodically and has outbursts of loud flights of ideas and random whistling spells. Pt. Continues to present very euphoric and animated. Pt. Denies anxiety and or depression.   A: Q x 15 minute observation checks were completed for safety. Patient was provided with education, needs reinforcement.Patient was given/offered medications per orders. Patient was encourage to attend groups, participate in unit activities and continue with plan of care. Pt. Chart and plans of care reviewed. Pt. Given support and encouragement.  R:Patient is complaint with medication and unit procedures.Pt. Utilizes CPAP with no complications. Pt. Attends snacks and groups appropriately. Pt. Given extensive education on falls risk safety with front wheel walker. Pt. Chronic pain being managed with PRN medications.Blood sugars monitored per orders. Pt. Does continue to refuse hydrocortisone cream. Pt. Insists she is not needing cream.           Precautionary checks every 15 minutes for safety maintained, room free of safety hazards, patient sustains no injury or falls during this shift. Will endorse care to next shift.

## 2018-03-24 LAB — GLUCOSE, CAPILLARY: GLUCOSE-CAPILLARY: 110 mg/dL — AB (ref 70–99)

## 2018-03-24 MED ORDER — QUETIAPINE FUMARATE 200 MG PO TABS
400.0000 mg | ORAL_TABLET | Freq: Every day | ORAL | Status: DC
  Administered 2018-03-24 – 2018-03-29 (×6): 400 mg via ORAL
  Filled 2018-03-24 (×6): qty 2

## 2018-03-24 NOTE — Plan of Care (Signed)
No safety concerns . Denies suicidal ideations .   Patient able to verbalize understanding of information  received .  Emotional and mental status improved  Continue to get  disorganized . Re instructed  on information given , Affect cheerful  in activities   on unit . Voice  no concerns  around  sleep. Continue to work on  Radiographer, therapeutic  and decisions. No concerns around  self control.    Problem: Education: Goal: Knowledge of [facility] Education information/materials will improve Outcome: Progressing Goal: Emotional status will improve Outcome: Progressing Goal: Mental status will improve Outcome: Progressing Goal: Verbalization of understanding the information provided will improve Outcome: Progressing   Problem: Activity: Goal: Interest or engagement in activities will improve Outcome: Progressing Goal: Sleeping patterns will improve Outcome: Progressing   Problem: Coping: Goal: Ability to verbalize frustrations and anger appropriately will improve Outcome: Progressing Goal: Ability to demonstrate self-control will improve Outcome: Progressing   Problem: Health Behavior/Discharge Planning: Goal: Compliance with treatment plan for underlying cause of condition will improve Outcome: Progressing   Problem: Safety: Goal: Periods of time without injury will increase Outcome: Progressing   Problem: Activity: Goal: Interest or engagement in leisure activities will improve Outcome: Progressing Goal: Imbalance in normal sleep/wake cycle will improve Outcome: Progressing   Problem: Health Behavior/Discharge Planning: Goal: Ability to make decisions will improve Outcome: Progressing Goal: Compliance with therapeutic regimen will improve Outcome: Progressing   Problem: Safety: Goal: Ability to disclose and discuss suicidal ideas will improve Outcome: Progressing Goal: Ability to identify and utilize support systems that promote safety will improve Outcome:  Progressing   Problem: Safety: Goal: Ability to remain free from injury will improve Outcome: Progressing

## 2018-03-24 NOTE — Progress Notes (Signed)
   03/24/18 1440  Clinical Encounter Type  Visited With Patient  Visit Type Follow-up;Spiritual support;Behavioral Health  Referral From Nurse  Consult/Referral To Chaplain  Spiritual Encounters  Spiritual Needs Emotional;Other (Comment)   Ch visited with the patient will she was coloring in the front day room. We talked about her being from [place], [place]. She was pleasant and spoke of wanting to visit [place] once she is released from the hospital. I will continue to follow up with the patient as needed.

## 2018-03-24 NOTE — [facility] Group Notes (Signed)
[facility] Group Notes:  (Nursing/MHT/Case Management/Adjunct)  Date:  03/24/2018  Time:  9:05 PM  Type of Therapy:  Group Therapy  Participation Level:  Active  Participation Quality:  Attentive  Affect:  Anxious  Cognitive:  Alert  Insight:  Lacking  Engagement in Group:  Engaged  Modes of Intervention:  Discussion  Summary of Progress/Problems: Keslie participated in group. Ladan presented with pressured speech. Janise was off topic, but was easily redirected. Oddie stated her goal was to get better. Brittin stated it was in God's hands to get better.  [name] 03/24/2018, 9:05 PM

## 2018-03-24 NOTE — [facility] Group Notes (Signed)
LCSW Group Therapy Note  03/24/2018 1:00pm  Type of Therapy/Topic:  Group Therapy:  Emotion Regulation  Participation Level:  Active   Description of Group:    The purpose of this group is to assist patients in learning to regulate negative emotions and experience positive emotions. Patients will be guided to discuss ways in which they have been vulnerable to their negative emotions. These vulnerabilities will be juxtaposed with experiences of positive emotions or situations, and patients will be challenged to use positive emotions to combat negative ones. Special emphasis will be placed on coping with negative emotions in conflict situations, and patients will process healthy conflict resolution skills.  Therapeutic Goals: 1. Patient will identify two positive emotions or experiences to reflect on in order to balance out negative emotions 2. Patient will label two or more emotions that they find the most difficult to experience 3. Patient will demonstrate positive conflict resolution skills through discussion and/or role plays  Summary of Patient Progress: Gina [name] actively participated in today's group discussion on emotion regulation.  She was able to identify two positive emotions to include happiness and amusement as well as one emotion that she finds to be the most difficult for her to experience which is depressed.  Gina [name] shared that she often uses prayer to help her to balance out her negative emotions.      Therapeutic Modalities:   Cognitive Behavioral Therapy Feelings Identification Dialectical Behavioral Therapy

## 2018-03-24 NOTE — Progress Notes (Signed)
D: Patient stated slept good last night .Stated appetite is good and energy level  Is normal. Stated concentration poor voice of not being able to remember   . Stated on Depression scale ,0 hopeless 0 and anxiety 0 .( low 0-10 high) Denies suicidal  homicidal ideations  .  No auditory hallucinations  No pain concerns . Appropriate ADL'S. Interacting with peers and staff.  No safety concerns . Denies suicidal ideations .   Patient able to verbalize understanding of information  received .  Emotional and mental status improved  Remains  to get  disorganized . Re instructed  on information given , Affect cheerful  in activities   on unit . Voice  no concerns  around  sleep. Continue to work on  Radiographer, therapeutic  and decisions. No concerns around  self control.   A: Encourage patient participation with unit programming . Instruction  Given on  Medication , verbalize understanding. R: Voice no other concerns. Staff continue to monitor

## 2018-03-24 NOTE — Progress Notes (Signed)
D:Ptdenies SI/HI/AVH. Pt is pleasant and cooperative, butstillvery manic utilizing pressured speech, flights of ideas, and religous preoccupation. Pt. Denies pain this evening, (despite reported chronic pain).Patient Interactionappropriate with staff and peers, but laughs very loudly to herself periodicallyand has outbursts of loud flights of ideas and random whistling spells still present. Pt. Continues to presentveryeuphoric and animated still. Pt. Denies anxiety and or depression and all psychiatric symptoms.  A: Q x 15 minute observation checks were completed for safety. Patient was provided with education, needs reinforcement.Patient was given/offered medications per orders. Patient was encourage to attend groups, participate in unit activities and continue with plan of care. Pt. Chart and plans of care reviewed. Pt. Given support and encouragement.  R:Patient is complaint with medication and unit procedures.Pt. Utilizes CPAP with no complications. Pt. Attends snacks and groups appropriately. Pt. Given extensive education on falls risk safety with front wheel walker. Pt. Chronic pain being managed with PRN medications.Blood sugars monitored per orders. Pt. Does continue to refuse hydrocortisone cream. Pt. Insists she is not needing cream like previous night.           Precautionary checks every 15 minutes for safety maintained, room free of safety hazards, patient sustains no injury or falls during this shift. Will endorse care to next shift.

## 2018-03-24 NOTE — Plan of Care (Signed)
Pt. Group and activities participation good. Pt. Sleeping good with CPAP with no complications. Pt. Complaint with medications. Pt. Denies si/hi and verbally able to contract for safety. Pt. Continues to present euphoric and animated, utilizing pressured speech.    Problem: Education: Goal: Emotional status will improve Outcome: Not Progressing Goal: Mental status will improve Outcome: Not Progressing  Problem: Activity: Goal: Interest or engagement in activities will improve Outcome: Progressing Goal: Sleeping patterns will improve Outcome: Progressing   Problem: Health Behavior/Discharge Planning: Goal: Compliance with treatment plan for underlying cause of condition will improve Outcome: Progressing   Problem: Safety: Goal: Periods of time without injury will increase Outcome: Progressing   Problem: Health Behavior/Discharge Planning: Goal: Compliance with therapeutic regimen will improve Outcome: Progressing   Problem: Safety: Goal: Ability to disclose and discuss suicidal ideas will improve Outcome: Progressing   Problem: Safety: Goal: Ability to remain free from injury will improve Outcome: Progressing

## 2018-03-24 NOTE — Plan of Care (Addendum)
Patient found in common area upon my arrival. Patient is visible and social this evening. Denies all complaints including SI/HI/AVH. Mood and affect are bright and cheerful. Speech is pressured, rapid, and loud. Reports eating and voiding adequately. Utilizing walker for ambulation. Compliant with HS medication and staff direction. Q 15 minute checks maintained. Will continue to monitor throughout the shift. Patient slept 6.5 hours. No evident distress. Compliant with C-PAP overnight. Compliant with Synthroid. Will endorse care to oncoming shift.  Problem: Education: Goal: Emotional status will improve Outcome: Progressing Goal: Mental status will improve Outcome: Progressing Goal: Verbalization of understanding the information provided will improve Outcome: Progressing   Problem: Activity: Goal: Interest or engagement in activities will improve Outcome: Progressing   Problem: Coping: Goal: Ability to demonstrate self-control will improve Outcome: Progressing   Problem: Activity: Goal: Interest or engagement in leisure activities will improve Outcome: Progressing   Problem: Safety: Goal: Ability to remain free from injury will improve Outcome: Progressing

## 2018-03-24 NOTE — Progress Notes (Signed)
Patient ID: Gina [name], female   DOB: [birth year], 66 y.o.   MRN: [id number] PER STATE REGULATIONS 482.30  THIS CHART WAS REVIEWED FOR MEDICAL NECESSITY WITH RESPECT TO THE PATIENT'S ADMISSION/DURATION OF STAY.  NEXT REVIEW DATE:03/28/18 Roma Schanz, RN, BSN CASE MANAGER

## 2018-03-24 NOTE — Progress Notes (Signed)
[facility] MD Progress Note  03/25/2018 12:14 PM Gina [name]  MRN:  [id number]  Subjective:    Unfortunately, Gina [name] is unraveling. Today not only she is euphoric but her speech became so pressured that she is hard to understand. She is also disorganized in her thinking, paranoid and more delusional. Slept six hours but her sleep is interrupted. She complains of constipation and is asking for a suppository.  I do not believe that the curren regimen of medications have been helpful. Neither Invega or Seroquel prevented escalation of mania after we stopped Depakote. She no longer can tolerate Lithium.  Principal Problem: Bipolar I disorder, current or most recent episode manic, with psychotic features ([place]) Diagnosis:   Patient Active Problem List   Diagnosis Date Noted  . Bipolar I disorder, current or most recent episode manic, with psychotic features ([place]) [F31.2] 11/04/2017    Priority: High  . Altered mental status, unspecified [R41.82] 03/16/2018  . Hypotension [I95.9] 03/15/2018  . Rash of hands [R21] 02/18/2018  . Pain of right heel [M79.671] 12/30/2017  . Encounter for chronic pain management [G89.29] 12/18/2017  . Foot pain, bilateral [id number], M79.672] 11/25/2017  . Gallbladder polyp [K82.4] 10/21/2017  . Left shoulder pain [M25.512] 10/21/2017  . Benign neoplasm of ascending colon [D12.2]   . Low back pain radiating to left lower extremity [M54.5, M79.605] 02/26/2017  . Acquired hypothyroidism [E03.9] 09/10/2016  . Thrombocytopenia ([place]) [D69.6] 06/15/2016  . Medicare annual wellness visit, subsequent [Z00.00] 06/04/2016  . Advanced care planning/counseling discussion [Z71.89] 06/04/2016  . External hemorrhoid [K64.4] 04/02/2016  . Slow transit constipation [K59.01] 01/04/2016  . Bipolar I disorder, most recent episode (or current) manic ([place]) [F31.10] 12/03/2015  . NAFLD (nonalcoholic fatty liver disease) [K76.0] 03/28/2015  . Gallbladder calculus without  cholecystitis [K80.20] 03/28/2015  . Gout [M10.9] 03/27/2015  . HCV antibody positive [R76.8] 03/27/2015  . History of bladder cancer [Z85.51] 03/27/2015  . History of bundle branch block [Z86.79] 03/27/2015  . Genetic testing [Z13.79] 02/28/2015  . Loss of memory [R41.3] 09/04/2014  . Controlled type 2 diabetes mellitus with diabetic nephropathy ([place]) [E11.21] 06/07/2014  . Hyperlipidemia [E78.5]   . Anemia in CKD (chronic kidney disease) [N18.9, D63.1] 01/26/2013  . Personal history of colonic adenomas and colon cancer [Z86.010] 11/11/2012  . Osteoarthritis of knee [M17.10] 10/02/2012  . OSA on CPAP [G47.33, Z99.89]   . CKD (chronic kidney disease) stage 4, GFR 15-29 ml/min (HCC) [N18.4]   . Severe obesity (BMI 35.0-39.9) with comorbidity ([place]) [E66.01]   . HTN (hypertension), benign [I10] 04/07/2012  . Hx of colon cancer, stage I [Z85.038] 08/05/2011   Total Time spent with patient: 20 minutes  Past Psychiatric History: bipolar disorder  Past Medical History:  Past Medical History:  Diagnosis Date  . Anemia in chronic kidney disease   . Bipolar depression Gina [name])    sees psychiatrist - psych admission 03/2015  . Bladder cancer ([place]) 1990's  . Blood transfusion without reported diagnosis 2009  . Cataract    left  . CKD (chronic kidney disease) stage 3, GFR 30-59 ml/min (HCC)   . Colon cancer ([place]) 1990's  . DJD (degenerative joint disease), lumbar    chronic lower back pain  . Enterocutaneous fistula 04/07/2012   completed PT 06/2012 ([facility])  . Family history of breast cancer   . Family history of colon cancer   . Family history of stomach cancer   . GERD (gastroesophageal reflux disease)   . History of bladder cancer 1997  .  History of colon cancer    s/p surgery  . History of uterine cancer    s/p hysterectomy  . Hyperlipidemia   . Hypertension   . IDA (iron deficiency anemia) 01/2013   thought due to h/o polyps  . Obesity (BMI 30-39.9)   . OSA on CPAP    6cm H2O   . Personal history of colonic adenomas and colon cancer 11/11/2012  . Positive hepatitis C antibody test 09/2014   but negative confirmatory testing  . RBBB   . Uncontrolled type 2 diabetes mellitus with nephropathy ([place]) 06/07/2014   completed DSME 02/2016    Past Surgical History:  Procedure Laterality Date  . BREAST BIOPSY Right 01/2014   fibroadenoma  . COLONOSCOPY  07/2009  . COLONOSCOPY  11/2012   2 tubular adenomas, mild diverticulosis, pending genetic testing for Lynch syndrome [name]) rpt 2 yrs  . COLONOSCOPY  02/2015   TA, diverticulosis, rpt 2 yrs [name])  . COLONOSCOPY WITH PROPOFOL N/A 06/09/2017   TAx1, rpt 2 yrs [name], [name], MD)  . DEXA  12/2009   WNL  . DOBUTAMINE STRESS ECHO  12/2009   no evidence of ischemia  . HERNIA REPAIR  02/04/12  . i & d abdominal wound  02/19/12  . LEFT OOPHORECTOMY  2005  . PARTIAL COLECTOMY  about 2008   for colon cancer  . PARTIAL HYSTERECTOMY  1981   uterine cancer, R ovary remains  . Reexploration of abdominal wound and Allograft placemet  02/26/12  . Removal of infected mesh and abdominal wound vac placement  02/24/12  . sleep study  02/2014   OSA - AHI 55, nadir 81% [name])   Family History:  Family History  Problem Relation Age of Onset  . Colon cancer Mother 59  . Stomach cancer Mother        dx in her 59s?  . CAD Father        MI; deceased 29  . Mental illness Sister        anxiety/depression  . Thyroid disease Sister   . Breast cancer Maternal Grandmother        age at dx unknown  . Diabetes Brother   . Diabetes Brother   . Diabetes Other        aunts and uncles both sides  . Arthritis Other        strong fmhx  . Colon cancer Other 19       maternal half-sister; deceased   Family Psychiatric  History: siater with mental illness Social History:  Social History   Substance and Sexual Activity  Alcohol Use No  . Alcohol/week: 0.0 standard drinks     Social History   Substance and Sexual Activity  Drug Use  No    Social History   Socioeconomic History  . Marital status: Single    Spouse name: Not on file  . Number of children: 0  . Years of education: Not on file  . Highest education level: Not on file  Occupational History  . Not on file  Social Needs  . Financial resource strain: Not on file  . Food insecurity:    Worry: Not on file    Inability: Not on file  . Transportation needs:    Medical: Not on file    Non-medical: Not on file  Tobacco Use  . Smoking status: Never Smoker  . Smokeless tobacco: Never Used  Substance and Sexual Activity  . Alcohol use: No    Alcohol/week:  0.0 standard drinks  . Drug use: No  . Sexual activity: Never  Lifestyle  . Physical activity:    Days per week: Not on file    Minutes per session: Not on file  . Stress: Not on file  Relationships  . Social connections:    Talks on phone: Not on file    Gets together: Not on file    Attends religious service: Not on file    Active member of club or organization: Not on file    Attends meetings of clubs or organizations: Not on file    Relationship status: Not on file  Other Topics Concern  . Not on file  Social History Narrative   Lives with sister, no pets   Occupation: disabled, for bipolar and arthritis   Edu: GED   Activity: take walks   Diet: good water, vegetables daily   Religion: [place], Dr. Jimmye Norman (ph [phone])   Additional Social History:                         Sleep: Poor  Appetite:  Fair  Current Medications: Current Facility-Administered Medications  Medication Dose Route Frequency Provider Last Rate Last Dose  . acetaminophen (TYLENOL) tablet 650 mg  650 mg Oral Q6H PRN Peta Peachey B, MD   650 mg at 03/23/18 1319  . alum & mag hydroxide-simeth (MAALOX/MYLANTA) 200-200-20 MG/5ML suspension 30 mL  30 mL Oral Q4H PRN Elhadji Pecore B, MD   30 mL at 03/20/18 1009  . amantadine (SYMMETREL) capsule 100 mg   100 mg Oral BID Ritchie Klee B, MD   100 mg at 03/25/18 2595  . bisacodyl (DULCOLAX) EC tablet 5 mg  5 mg Oral Daily PRN Tylin Stradley B, MD   5 mg at 03/18/18 0333  . carbamazepine (TEGRETOL) tablet 200 mg  200 mg Oral BID AC & HS Jentri Aye B, MD   200 mg at 03/25/18 0828  . docusate sodium (COLACE) capsule 100 mg  100 mg Oral BID Kirstin Kugler B, MD   100 mg at 03/25/18 6387  . hydrocortisone (ANUSOL-HC) 2.5 % rectal cream   Rectal QID Hovanes Hymas B, MD      . lamoTRIgine (LAMICTAL) tablet 200 mg  200 mg Oral QHS Birch Farino B, MD   200 mg at 03/24/18 2116  . levothyroxine (SYNTHROID, LEVOTHROID) tablet 50 mcg  50 mcg Oral QAC breakfast Conni Knighton B, MD   50 mcg at 03/25/18 0627  . linagliptin (TRADJENTA) tablet 5 mg  5 mg Oral Daily Arial Galligan B, MD   5 mg at 03/25/18 5643  . losartan (COZAAR) tablet 50 mg  50 mg Oral Daily Jaber Dunlow B, MD   50 mg at 03/25/18 3295  . magnesium hydroxide (MILK OF MAGNESIA) suspension 30 mL  30 mL Oral Daily PRN Moet Mikulski B, MD   30 mL at 03/18/18 0333  . metoprolol tartrate (LOPRESSOR) tablet 12.5 mg  12.5 mg Oral BID Yatzary Merriweather B, MD   12.5 mg at 03/25/18 0821  . [START ON 04/06/2018] paliperidone (INVEGA SUSTENNA) injection 234 mg  234 mg Intramuscular Q28 days Tron Flythe B, MD      . polyethylene glycol (MIRALAX / GLYCOLAX) packet 17 g  17 g Oral Daily Roshun Klingensmith B, MD   17 g at 03/25/18 0823  . QUEtiapine (SEROQUEL) tablet 400 mg  400 mg  Oral QHS Ruston Fedora B, MD   400 mg at 03/24/18 2116  . simethicone (MYLICON) chewable tablet 80 mg  80 mg Oral QID Prue Lingenfelter B, MD   80 mg at 03/25/18 0821  . simvastatin (ZOCOR) tablet 20 mg  20 mg Oral q1800 Keiosha Cancro B, MD   20 mg at 03/24/18 1810    Lab Results:  Results for orders placed or performed during the hospital encounter of 03/16/18 (from the past 48 hour(s))  Glucose,  capillary     Status: Abnormal   Collection Time: 03/24/18  7:05 AM  Result Value Ref Range   Glucose-Capillary 110 (H) 70 - 99 mg/dL    Blood Alcohol level:  Lab Results  Component Value Date   ETH <10 03/11/2018   ETH <10 09/38/1829    Metabolic Disorder Labs: Lab Results  Component Value Date   HGBA1C 6.6 (H) 02/18/2018   MPG 122.63 11/14/2017   MPG 114 04/14/2012   Lab Results  Component Value Date   PROLACTIN 33.6 (H) 12/04/2015   Lab Results  Component Value Date   CHOL 140 11/14/2017   TRIG 152 (H) 11/14/2017   HDL 50 11/14/2017   CHOLHDL 2.8 11/14/2017   VLDL 30 11/14/2017   LDLCALC 60 11/14/2017   LDLCALC 81 09/30/2017    Physical Findings: AIMS:  , ,  ,  ,    CIWA:    COWS:     Musculoskeletal: Strength & Muscle Tone: within normal limits Gait & Station: normal Patient leans: N/A  Psychiatric Specialty Exam: Physical Exam  Nursing note and vitals reviewed. Psychiatric: Her affect is labile and inappropriate. Her speech is rapid and/or pressured and tangential. She is hyperactive. Thought content is delusional. Cognition and memory are impaired. She expresses impulsivity.    Review of Systems  Neurological: Negative.   Psychiatric/Behavioral: The patient has insomnia.   All other systems reviewed and are negative.   Blood pressure 134/66, pulse 75, temperature 97.8 F (36.6 C), temperature source Oral, resp. rate 16, height 5\' 2"  (1.575 m), weight 87.1 kg, SpO2 100 %.Body mass index is 35.12 kg/m.  General Appearance: Casual  Eye Contact:  Good  Speech:  Pressured  Volume:  Increased  Mood:  Euphoric  Affect:  Inappropriate and Labile  Thought Process:  Disorganized, Irrelevant and Descriptions of Associations: Loose  Orientation:  Full (Time, Place, and Person)  Thought Content:  Delusions  Suicidal Thoughts:  No  Homicidal Thoughts:  No  Memory:  Immediate;   Poor Recent;   Poor Remote;   Poor  Judgement:  Poor  Insight:  Lacking   Psychomotor Activity:  Increased and Restlessness  Concentration:  Concentration: Poor and Attention Span: Poor  Recall:  Poor  Fund of Knowledge:  Fair  Language:  Fair  Akathisia:  No  Handed:  Right  AIMS (if indicated):     Assets:  Communication Skills Desire for Improvement Financial Resources/Insurance Housing Physical Health Resilience Social Support  ADL's:  Intact  Cognition:  WNL  Sleep:  Number of Hours: 6.5     Treatment Plan Summary: Daily contact with patient to assess and evaluate symptoms and progress in treatment and Medication management   Ms. Speth is a 67 year old female with a history of bipolar disorder admitted for a manic episode in the context of medication changes.She had severe insomnia and was given 30 mg of Restoril tonight causinghypotonia and was briefly hospitalized at ICU. She is still hypomanic. She tolerates transition from  Depakote to Lamictal well but manic symptoms are on the rise.   #Bipolar disorder, full blown mania now -continue Invega sustenna injection 234 mg every 28 days, next dose on 9/3 -increase Seroquel to 400 mg nightlyfor mood stabilization -completedDepakote taper due to thrombocytopenia -continue Lamictal to 200 mg nightly -continue Tegretol 200 mg BID -add Geodon 40 mg BID today, 80 mg BID tomorrow   #Thrombocytopenia, resolved -Depakotewas discontinued   #Insomnia -Melatonin 10 mg nightly  #Sleep apnea -CPAP nightly  #Hypothyroidism -Continue Synthroid 50 mcg daily  #HTN -Continue Metoprolol 12.5 mg twice daily and Cozaar 50 mg daily  #DM -Tradjenta 5 mg daily -ADA diet  #Constipation -bowel regimen -Anusol PRN  #Dislipidemia  -Zocor 20 mg daily  #Labs -Hemoglobin A1c was 6.6 and total cholesterol was 140  #Disposition -discharge to home with the sister -follow up with [facility]  Orson Slick, MD 03/25/2018, 12:14 PM

## 2018-03-25 MED ORDER — BISACODYL 10 MG RE SUPP
10.0000 mg | Freq: Once | RECTAL | Status: AC
  Administered 2018-03-25: 10 mg via RECTAL
  Filled 2018-03-25: qty 1

## 2018-03-25 MED ORDER — ZIPRASIDONE HCL 40 MG PO CAPS
40.0000 mg | ORAL_CAPSULE | Freq: Two times a day (BID) | ORAL | Status: DC
  Administered 2018-03-25 – 2018-03-26 (×2): 40 mg via ORAL
  Filled 2018-03-25 (×2): qty 1

## 2018-03-25 NOTE — Progress Notes (Signed)
Recreation Therapy Notes   Date: 03/25/2018  Time: 9:30 am  Location: Craft Room  Behavioral response: Appropriate, redirected, agitated  Intervention Topic: Happiness  Discussion/Intervention:  Group content today was focused on Happiness. The group defined happiness and stated reasons they are and are not happy at times. Participants identified reasons they are normally happy and why. Individuals expressed how not being happy affects themselves and others. Patients stated reasons why happiness is important to them. The group described how they feel when they are happy. Individuals participated in the intervention "What is happiness" where they defined what happiness means to them.  Clinical Observations/Feedback:  Patient came to group and defined happiness as movies, sense of humor, friends and facial expression. She participated in the intervention and was social with peers and staff during group. Individual got agitated with a peer during group and became loud, she was asked to leave group if she could not lower her voice. Patient left group and never returned. Jhonatan Lomeli LRT/CTRS         Josue Kass 03/25/2018 11:40 AM

## 2018-03-25 NOTE — Plan of Care (Signed)
Patient is alert and oriented, denies SI, HI and AVH. Patient continues to have rapid, pressured speech with flight of ideas. Patient complains of constipation given stool softener as well as mira lax  to help manage constipation symptoms. Patient shows interest in activities by attending groups and talking to staff and peers on the unit. Patient is eating and bathing independently also able to take medications. Patient is very animated and euphoristic. Patient rates pain 0/10. No self injurious behaviors is observed . Nurse will continue to monitor. Problem: Education: Goal: Knowledge of [place] [facility] Education information/materials will improve Outcome: Progressing Goal: Emotional status will improve Outcome: Progressing Goal: Mental status will improve Outcome: Progressing Goal: Verbalization of understanding the information provided will improve Outcome: Progressing   Problem: Activity: Goal: Interest or engagement in activities will improve Outcome: Progressing Goal: Sleeping patterns will improve Outcome: Progressing

## 2018-03-25 NOTE — [facility] Group Notes (Signed)
[place] Group Notes:  (Nursing/MHT/Case Management/Adjunct)  Date:  03/25/2018  Time:  10:32 PM  Type of Therapy:  Group Therapy  Participation Level:  Did Not Attend   [name] 03/25/2018, 10:32 PM

## 2018-03-25 NOTE — Plan of Care (Addendum)
Patient awake in bed upon my arrival. Patient is neither visible nor social this evening. States that she has loose stool and does not want to be that far from the toilet. Patient is bowel obsessed as she had told earlier shift that she was constipated. "When it rains it pours, you know?" Mood and affect are bright and cheerful. Speech remains rapid and pressured. Patient is polite and cooperative. Denies SI/HI/AVH. Reports eating adequately. Compliant with HS medications and staff direction. Q 15 minute checks maintained. Will continue to monitor throughout the shift. Patient slept 7.75 hours. No apparent distress. Will endorse care to oncoming shift.  Problem: Education: Goal: Knowledge of [place] [facility] Education information/materials will improve Outcome: Progressing Goal: Emotional status will improve Outcome: Progressing Goal: Mental status will improve Outcome: Progressing Goal: Verbalization of understanding the information provided will improve Outcome: Progressing   Problem: Activity: Goal: Interest or engagement in activities will improve Outcome: Progressing Goal: Sleeping patterns will improve Outcome: Progressing   Problem: Coping: Goal: Ability to demonstrate self-control will improve Outcome: Progressing   Problem: Health Behavior/Discharge Planning: Goal: Compliance with treatment plan for underlying cause of condition will improve Outcome: Progressing

## 2018-03-25 NOTE — BHH Group Notes (Signed)
LCSW Group Therapy Note  03/25/2018 1:00pm  Type of Therapy/Topic:  Group Therapy:  Balance in Life  Participation Level:  Did Not Attend  Description of Group:    This group will address the concept of balance and how it feels and looks when one is unbalanced. Patients will be encouraged to process areas in their lives that are out of balance and identify reasons for remaining unbalanced. Facilitators will guide patients in utilizing problem-solving interventions to address and correct the stressor making their life unbalanced. Understanding and applying boundaries will be explored and addressed for obtaining and maintaining a balanced life. Patients will be encouraged to explore ways to assertively make their unbalanced needs known to significant others in their lives, using other group members and facilitator for support and feedback.  Therapeutic Goals: 1. Patient will identify two or more emotions or situations they have that consume much of in their lives. 2. Patient will identify signs/triggers that life has become out of balance:  3. Patient will identify two ways to set boundaries in order to achieve balance in their lives:  4. Patient will demonstrate ability to communicate their needs through discussion and/or role plays  Summary of Patient Progress:      Therapeutic Modalities:   Cognitive Behavioral Therapy Solution-Focused Therapy Assertiveness Training  Devona Konig, LCSW 03/25/2018 4:05 PM

## 2018-03-25 NOTE — Progress Notes (Signed)
[facility] MD Progress Note  03/26/2018 10:18 AM Gina [name]  MRN:  [id number]  Subjective:    Gina [name] is still manic in her behavior and disorganized and in her speech and thinking. She was started on Geodon yesterday and seems to tolerate it well. I will increase Geodon to 80 mg BID in an attempt to compensate for Depakote that was discontinued due to thrombocytopenia.   Principal Problem: Bipolar I disorder, current or most recent episode manic, with psychotic features ([place]) Diagnosis:   Patient Active Problem List   Diagnosis Date Noted  . Bipolar I disorder, current or most recent episode manic, with psychotic features ([place]) [F31.2] 11/04/2017    Priority: High  . Altered mental status, unspecified [R41.82] 03/16/2018  . Hypotension [I95.9] 03/15/2018  . Rash of hands [R21] 02/18/2018  . Pain of right heel [M79.671] 12/30/2017  . Encounter for chronic pain management [G89.29] 12/18/2017  . Foot pain, bilateral [id number], M79.672] 11/25/2017  . Gallbladder polyp [K82.4] 10/21/2017  . Left shoulder pain [M25.512] 10/21/2017  . Benign neoplasm of ascending colon [D12.2]   . Low back pain radiating to left lower extremity [M54.5, M79.605] 02/26/2017  . Acquired hypothyroidism [E03.9] 09/10/2016  . Thrombocytopenia ([place]) [D69.6] 06/15/2016  . Medicare annual wellness visit, subsequent [Z00.00] 06/04/2016  . Advanced care planning/counseling discussion [Z71.89] 06/04/2016  . External hemorrhoid [K64.4] 04/02/2016  . Slow transit constipation [K59.01] 01/04/2016  . Bipolar I disorder, most recent episode (or current) manic ([place]) [F31.10] 12/03/2015  . NAFLD (nonalcoholic fatty liver disease) [K76.0] 03/28/2015  . Gallbladder calculus without cholecystitis [K80.20] 03/28/2015  . Gout [M10.9] 03/27/2015  . HCV antibody positive [R76.8] 03/27/2015  . History of bladder cancer [Z85.51] 03/27/2015  . History of bundle branch block [Z86.79] 03/27/2015  . Genetic testing  [Z13.79] 02/28/2015  . Loss of memory [R41.3] 09/04/2014  . Controlled type 2 diabetes mellitus with diabetic nephropathy ([place]) [E11.21] 06/07/2014  . Hyperlipidemia [E78.5]   . Anemia in CKD (chronic kidney disease) [N18.9, D63.1] 01/26/2013  . Personal history of colonic adenomas and colon cancer [Z86.010] 11/11/2012  . Osteoarthritis of knee [M17.10] 10/02/2012  . OSA on CPAP [G47.33, Z99.89]   . CKD (chronic kidney disease) stage 4, GFR 15-29 ml/min (HCC) [N18.4]   . Severe obesity (BMI 35.0-39.9) with comorbidity ([place]) [E66.01]   . HTN (hypertension), benign [I10] 04/07/2012  . Hx of colon cancer, stage I [Z85.038] 08/05/2011   Total Time spent with patient: 20 minutes  Past Psychiatric History: bipolar disorder  Past Medical History:  Past Medical History:  Diagnosis Date  . Anemia in chronic kidney disease   . Bipolar depression [facility])    sees psychiatrist - psych admission 03/2015  . Bladder cancer ([place]) 1990's  . Blood transfusion without reported diagnosis 2009  . Cataract    left  . CKD (chronic kidney disease) stage 3, GFR 30-59 ml/min (HCC)   . Colon cancer ([place]) 1990's  . DJD (degenerative joint disease), lumbar    chronic lower back pain  . Enterocutaneous fistula 04/07/2012   completed PT 06/2012 ([facility])  . Family history of breast cancer   . Family history of colon cancer   . Family history of stomach cancer   . GERD (gastroesophageal reflux disease)   . History of bladder cancer 1997  . History of colon cancer    s/p surgery  . History of uterine cancer    s/p hysterectomy  . Hyperlipidemia   . Hypertension   . IDA (iron deficiency  anemia) 01/2013   thought due to h/o polyps  . Obesity (BMI 30-39.9)   . OSA on CPAP    6cm H2O  . Personal history of colonic adenomas and colon cancer 11/11/2012  . Positive hepatitis C antibody test 09/2014   but negative confirmatory testing  . RBBB   . Uncontrolled type 2 diabetes mellitus with nephropathy ([place])  06/07/2014   completed DSME 02/2016    Past Surgical History:  Procedure Laterality Date  . BREAST BIOPSY Right 01/2014   fibroadenoma  . COLONOSCOPY  07/2009  . COLONOSCOPY  11/2012   2 tubular adenomas, mild diverticulosis, pending genetic testing for Lynch syndrome [name]) rpt 2 yrs  . COLONOSCOPY  02/2015   TA, diverticulosis, rpt 2 yrs [name])  . COLONOSCOPY WITH PROPOFOL N/A 06/09/2017   TAx1, rpt 2 yrs [name], [name], MD)  . DEXA  12/2009   WNL  . DOBUTAMINE STRESS ECHO  12/2009   no evidence of ischemia  . HERNIA REPAIR  02/04/12  . i & d abdominal wound  02/19/12  . LEFT OOPHORECTOMY  2005  . PARTIAL COLECTOMY  about 2008   for colon cancer  . PARTIAL HYSTERECTOMY  1981   uterine cancer, R ovary remains  . Reexploration of abdominal wound and Allograft placemet  02/26/12  . Removal of infected mesh and abdominal wound vac placement  02/24/12  . sleep study  02/2014   OSA - AHI 55, nadir 81% [name])   Family History:  Family History  Problem Relation Age of Onset  . Colon cancer Mother 13  . Stomach cancer Mother        dx in her 52s?  . CAD Father        MI; deceased 66  . Mental illness Sister        anxiety/depression  . Thyroid disease Sister   . Breast cancer Maternal Grandmother        age at dx unknown  . Diabetes Brother   . Diabetes Brother   . Diabetes Other        aunts and uncles both sides  . Arthritis Other        strong fmhx  . Colon cancer Other 47       maternal half-sister; deceased   Family Psychiatric  History: sister with mental illness Social History:  Social History   Substance and Sexual Activity  Alcohol Use No  . Alcohol/week: 0.0 standard drinks     Social History   Substance and Sexual Activity  Drug Use No    Social History   Socioeconomic History  . Marital status: Single    Spouse name: Not on file  . Number of children: 0  . Years of education: Not on file  . Highest education level: Not on file  Occupational  History  . Not on file  Social Needs  . Financial resource strain: Not on file  . Food insecurity:    Worry: Not on file    Inability: Not on file  . Transportation needs:    Medical: Not on file    Non-medical: Not on file  Tobacco Use  . Smoking status: Never Smoker  . Smokeless tobacco: Never Used  Substance and Sexual Activity  . Alcohol use: No    Alcohol/week: 0.0 standard drinks  . Drug use: No  . Sexual activity: Never  Lifestyle  . Physical activity:    Days per week: Not on file    Minutes per  session: Not on file  . Stress: Not on file  Relationships  . Social connections:    Talks on phone: Not on file    Gets together: Not on file    Attends religious service: Not on file    Active member of club or organization: Not on file    Attends meetings of clubs or organizations: Not on file    Relationship status: Not on file  Other Topics Concern  . Not on file  Social History Narrative   Lives with sister, no pets   Occupation: disabled, for bipolar and arthritis   Edu: GED   Activity: take walks   Diet: good water, vegetables daily   Religion: [place], Dr. Jimmye Norman (ph [phone])   Additional Social History:                         Sleep: Poor  Appetite:  Fair  Current Medications: Current Facility-Administered Medications  Medication Dose Route Frequency Provider Last Rate Last Dose  . acetaminophen (TYLENOL) tablet 650 mg  650 mg Oral Q6H PRN Hashem Goynes B, MD   650 mg at 03/23/18 1319  . alum & mag hydroxide-simeth (MAALOX/MYLANTA) 200-200-20 MG/5ML suspension 30 mL  30 mL Oral Q4H PRN Levette Paulick B, MD   30 mL at 03/20/18 1009  . amantadine (SYMMETREL) capsule 100 mg  100 mg Oral BID Krishauna Schatzman B, MD   100 mg at 03/26/18 0814  . bisacodyl (DULCOLAX) EC tablet 5 mg  5 mg Oral Daily PRN Keondra Haydu B, MD   5 mg at 03/18/18 0333  . carbamazepine (TEGRETOL) tablet 200 mg   200 mg Oral BID AC & HS Kailer Heindel B, MD   200 mg at 03/26/18 0820  . docusate sodium (COLACE) capsule 100 mg  100 mg Oral BID Darthula Desa B, MD   100 mg at 03/25/18 1653  . hydrocortisone (ANUSOL-HC) 2.5 % rectal cream   Rectal QID Amye Grego B, MD      . lamoTRIgine (LAMICTAL) tablet 200 mg  200 mg Oral QHS Gisella Alwine B, MD   200 mg at 03/25/18 2127  . levothyroxine (SYNTHROID, LEVOTHROID) tablet 50 mcg  50 mcg Oral QAC breakfast Indiana Pechacek B, MD   50 mcg at 03/26/18 4287  . linagliptin (TRADJENTA) tablet 5 mg  5 mg Oral Daily Nehemiah Montee B, MD   5 mg at 03/26/18 0819  . losartan (COZAAR) tablet 50 mg  50 mg Oral Daily Dhani Dannemiller B, MD   50 mg at 03/26/18 0814  . magnesium hydroxide (MILK OF MAGNESIA) suspension 30 mL  30 mL Oral Daily PRN Avice Funchess B, MD   30 mL at 03/18/18 0333  . metoprolol tartrate (LOPRESSOR) tablet 12.5 mg  12.5 mg Oral BID Katyra Tomassetti B, MD   12.5 mg at 03/26/18 0814  . polyethylene glycol (MIRALAX / GLYCOLAX) packet 17 g  17 g Oral Daily Renzo Vincelette B, MD   17 g at 03/25/18 0823  . QUEtiapine (SEROQUEL) tablet 400 mg  400 mg Oral QHS Miran Kautzman B, MD   400 mg at 03/25/18 2127  . simethicone (MYLICON) chewable tablet 80 mg  80 mg Oral QID Neal Trulson B, MD   80 mg at 03/26/18 0814  . simvastatin (ZOCOR) tablet 20 mg  20 mg Oral q1800 Theordore Cisnero B, MD   20 mg at  03/25/18 1652  . ziprasidone (GEODON) capsule 80 mg  80 mg Oral BID WC Raynah Gomes B, MD        Lab Results:  Results for orders placed or performed during the hospital encounter of 03/16/18 (from the past 48 hour(s))  Glucose, capillary     Status: Abnormal   Collection Time: 03/26/18  7:12 AM  Result Value Ref Range   Glucose-Capillary 104 (H) 70 - 99 mg/dL   Comment 1 Notify RN     Blood Alcohol level:  Lab Results  Component Value Date   ETH <10 03/11/2018   ETH <10 84/16/6063     Metabolic Disorder Labs: Lab Results  Component Value Date   HGBA1C 6.6 (H) 02/18/2018   MPG 122.63 11/14/2017   MPG 114 04/14/2012   Lab Results  Component Value Date   PROLACTIN 33.6 (H) 12/04/2015   Lab Results  Component Value Date   CHOL 140 11/14/2017   TRIG 152 (H) 11/14/2017   HDL 50 11/14/2017   CHOLHDL 2.8 11/14/2017   VLDL 30 11/14/2017   LDLCALC 60 11/14/2017   LDLCALC 81 09/30/2017    Physical Findings: AIMS:  , ,  ,  ,    CIWA:    COWS:     Musculoskeletal: Strength & Muscle Tone: within normal limits Gait & Station: normal Patient leans: N/A  Psychiatric Specialty Exam: Physical Exam  Nursing note and vitals reviewed. Psychiatric: Her affect is labile and inappropriate. Her speech is rapid and/or pressured and slurred. She is hyperactive. Thought content is paranoid and delusional. Cognition and memory are impaired. She expresses impulsivity.    Review of Systems  Neurological: Negative.   Psychiatric/Behavioral: The patient has insomnia.   All other systems reviewed and are negative.   Blood pressure 127/73, pulse 84, temperature 98.2 F (36.8 C), temperature source Oral, resp. rate 18, height 5\' 2"  (1.575 m), weight 87.1 kg, SpO2 100 %.Body mass index is 35.12 kg/m.  General Appearance: Casual  Eye Contact:  Good  Speech:  Pressured and Slurred  Volume:  Increased  Mood:  Euphoric and Irritable  Affect:  Congruent  Thought Process:  Disorganized, Irrelevant and Descriptions of Associations: Loose  Orientation:  Full (Time, Place, and Person)  Thought Content:  Delusions and Paranoid Ideation  Suicidal Thoughts:  No  Homicidal Thoughts:  No  Memory:  Immediate;   Poor Recent;   Poor Remote;   Poor  Judgement:  Impaired  Insight:  Shallow  Psychomotor Activity:  Increased  Concentration:  Concentration: Poor and Attention Span: Poor  Recall:  Poor  Fund of Knowledge:  Fair  Language:  Fair  Akathisia:  No  Handed:  Right  AIMS  (if indicated):     Assets:  Communication Skills Desire for Improvement Financial Resources/Insurance Housing Physical Health Resilience Social Support  ADL's:  Intact  Cognition:  WNL  Sleep:  Number of Hours: 7.75     Treatment Plan Summary: Daily contact with patient to assess and evaluate symptoms and progress in treatment and Medication management   Ms. Vankirk is a 67 year old female with a history of bipolar disorder admitted for a manic episode in the context of medication changes.She had severe insomnia and was given 30 mg of Restoril tonight causinghypotonia and was briefly hospitalized at ICU. She is still hypomanic. She tolerates transition from Depakote to Lamictal well but manic symptoms are on the rise.   #Bipolar disorder, full blown mania now -continue Invega sustenna injection 234 mg  every 28 days, next dose on 9/3 -increaseSeroquelto 400mg  nightlyfor mood stabilization -completedDepakote taper due to thrombocytopenia -continue Lamictal to 200 mg nightly -continue Tegretol 200 mg BID -increase Geodon to 80 mg BID  #Thrombocytopenia, resolved -Depakotewas discontinued   #Insomnia -Melatonin 10 mg nightly  #Sleep apnea -CPAP nightly  #Hypothyroidism -Continue Synthroid 50 mcg daily  #HTN -Continue Metoprolol 12.5 mg twice daily and Cozaar 50 mg daily  #DM -Tradjenta 5 mg daily -ADA diet  #Constipation -bowel regimen -Anusol PRN  #Dislipidemia  -Zocor 20 mg daily  #Labs -Hemoglobin A1c was 6.6 and total cholesterol was 140  #Disposition -discharge to home with the sister -follow up with [facility]    Orson Slick, MD 03/26/2018, 10:18 AM

## 2018-03-25 NOTE — [facility] Group Notes (Signed)
[place] Group Notes:  (Nursing/MHT/Case Management/Adjunct)  Date:  03/25/2018  Time:  2:49 PM  Type of Therapy:  Psychoeducational Skills  Participation Level:  Minimal  Participation Quality:  Disruptive  Affect:  Blunted and Flat  Cognitive:  Disorganized  Insight:  Limited  Engagement in Group:  Distracting and Limited  Modes of Intervention:  Discussion, Education and Limit-setting  Summary of Progress/Problems:  [name] 03/25/2018, 2:49 PM

## 2018-03-26 ENCOUNTER — Other Ambulatory Visit: Payer: Self-pay

## 2018-03-26 LAB — GLUCOSE, CAPILLARY: GLUCOSE-CAPILLARY: 104 mg/dL — AB (ref 70–99)

## 2018-03-26 MED ORDER — ZIPRASIDONE HCL 40 MG PO CAPS
80.0000 mg | ORAL_CAPSULE | Freq: Two times a day (BID) | ORAL | Status: DC
  Administered 2018-03-26 – 2018-04-05 (×20): 80 mg via ORAL
  Filled 2018-03-26 (×20): qty 2

## 2018-03-26 NOTE — [facility] Group Notes (Signed)

## 2018-03-26 NOTE — Patient Outreach (Signed)
[facility]) Care Management  03/26/2018  Rhaya Coale [birth year] [id number]   Chart review:  Patient remains inpatient.    PLAN: RNCM will continue to follow for discharge disposition.   Quinn Plowman RN,BSN,CCM [facility] Telephonic  [phone]

## 2018-03-26 NOTE — Plan of Care (Signed)
Patient is alert and oriented; continues with rapid pressured speech. Patient is very euphoric today and silly telling jokes. Patient rated pain as 0/10. Patient no longer complaining of constipation, last night experienced some diarrhea. Patient does shower and change clothes, present in group sessions. Patient is pleasant and expresses no other concerns at this time. Problem: Education: Goal: Knowledge of [facility] Education information/materials will improve Outcome: Progressing Goal: Emotional status will improve Outcome: Not Progressing Goal: Mental status will improve Outcome: Not Progressing Goal: Verbalization of understanding the information provided will improve Outcome: Progressing   Problem: Activity: Goal: Interest or engagement in activities will improve Outcome: Progressing Goal: Sleeping patterns will improve Outcome: Progressing   Problem: Coping: Goal: Ability to verbalize frustrations and anger appropriately will improve Outcome: Progressing Goal: Ability to demonstrate self-control will improve Outcome: Progressing

## 2018-03-26 NOTE — Tx Team (Signed)
Interdisciplinary Treatment and Diagnostic Plan Update  03/22/2018 Time of Session: 10:50 AM Gina [name] MRN: [id number]  Principal Diagnosis: Bipolar I disorder, current or most recent episode manic, with psychotic features ([place])  Secondary Diagnoses: Principal Problem:   Bipolar I disorder, current or most recent episode manic, with psychotic features ([place]) Active Problems:   HTN (hypertension), benign   OSA on CPAP   Osteoarthritis of knee   Thrombocytopenia ([place])   Acquired hypothyroidism   Current Medications:  Current Facility-Administered Medications  Medication Dose Route Frequency Provider Last Rate Last Dose  . acetaminophen (TYLENOL) tablet 650 mg  650 mg Oral Q6H PRN Pucilowska, Jolanta B, MD   650 mg at 03/23/18 1319  . alum & mag hydroxide-simeth (MAALOX/MYLANTA) 200-200-20 MG/5ML suspension 30 mL  30 mL Oral Q4H PRN Pucilowska, Jolanta B, MD   30 mL at 03/20/18 1009  . amantadine (SYMMETREL) capsule 100 mg  100 mg Oral BID Pucilowska, Jolanta B, MD   100 mg at 03/26/18 0814  . bisacodyl (DULCOLAX) EC tablet 5 mg  5 mg Oral Daily PRN Pucilowska, Jolanta B, MD   5 mg at 03/18/18 0333  . carbamazepine (TEGRETOL) tablet 200 mg  200 mg Oral BID AC & HS Pucilowska, Jolanta B, MD   200 mg at 03/26/18 0820  . docusate sodium (COLACE) capsule 100 mg  100 mg Oral BID Pucilowska, Jolanta B, MD   100 mg at 03/25/18 1653  . hydrocortisone (ANUSOL-HC) 2.5 % rectal cream   Rectal QID Pucilowska, Jolanta B, MD      . lamoTRIgine (LAMICTAL) tablet 200 mg  200 mg Oral QHS Pucilowska, Jolanta B, MD   200 mg at 03/25/18 2127  . levothyroxine (SYNTHROID, LEVOTHROID) tablet 50 mcg  50 mcg Oral QAC breakfast Pucilowska, Jolanta B, MD   50 mcg at 03/26/18 8676  . linagliptin (TRADJENTA) tablet 5 mg  5 mg Oral Daily Pucilowska, Jolanta B, MD   5 mg at 03/26/18 0819  . losartan (COZAAR) tablet 50 mg  50 mg Oral Daily Pucilowska, Jolanta B, MD   50 mg at 03/26/18 0814  . magnesium  hydroxide (MILK OF MAGNESIA) suspension 30 mL  30 mL Oral Daily PRN Pucilowska, Jolanta B, MD   30 mL at 03/18/18 0333  . metoprolol tartrate (LOPRESSOR) tablet 12.5 mg  12.5 mg Oral BID Pucilowska, Jolanta B, MD   12.5 mg at 03/26/18 0814  . polyethylene glycol (MIRALAX / GLYCOLAX) packet 17 g  17 g Oral Daily Pucilowska, Jolanta B, MD   17 g at 03/25/18 0823  . QUEtiapine (SEROQUEL) tablet 400 mg  400 mg Oral QHS Pucilowska, Jolanta B, MD   400 mg at 03/25/18 2127  . simethicone (MYLICON) chewable tablet 80 mg  80 mg Oral QID Pucilowska, Jolanta B, MD   80 mg at 03/26/18 1151  . simvastatin (ZOCOR) tablet 20 mg  20 mg Oral q1800 Pucilowska, Jolanta B, MD   20 mg at 03/25/18 1652  . ziprasidone (GEODON) capsule 80 mg  80 mg Oral BID WC Pucilowska, Jolanta B, MD       PTA Medications: Medications Prior to Admission  Medication Sig Dispense Refill Last Dose  . ACCU-CHEK AVIVA PLUS test strip 1 each by Other route daily. E11.21 100 each 3 Taking  . clotrimazole (LOTRIMIN) 1 % cream Apply 1 application topically 2 (two) times daily. Apply to toes   Past Month at Unknown time  . diclofenac sodium (VOLTAREN) 1 % GEL Apply 4 g  topically 4 (four) times daily. To knees 5 Tube 5 Past Month at Unknown time  . divalproex (DEPAKOTE) 250 MG DR tablet Take 250 mg by mouth 2 (two) times daily.   unknown at unknown  . docusate sodium (COLACE) 100 MG capsule Take 2 capsules (200 mg total) by mouth 2 (two) times daily. 60 capsule 1 Past Month at Unknown time  . GAVILAX powder TAKE 17 GRAMS BY MOUTH TWICE DAILY AS NEEDED FOR MODERATE CONSTIPATION. (Patient taking differently: Take 17 g by mouth 2 (two) times daily as needed for moderate constipation. ) 510 g 1 PRN at PRN  . lamoTRIgine (LAMICTAL) 25 MG tablet Take 2 tablets (50 mg total) by mouth 2 (two) times daily. (Patient taking differently: Take 50 mg by mouth every morning. ) 120 tablet 1 Past Month at Unknown time  . levothyroxine (SYNTHROID, LEVOTHROID) 50  MCG tablet Take 1 tablet (50 mcg total) by mouth daily before breakfast. 30 tablet 11 Past Month at Unknown time  . losartan (COZAAR) 50 MG tablet Take 1 tablet (50 mg total) by mouth daily. 30 tablet 11 Past Month at Unknown time  . metoprolol tartrate (LOPRESSOR) 25 MG tablet Take 0.5 tablets (12.5 mg total) by mouth 2 (two) times daily. 60 tablet 1 Past Month at Unknown time  . paliperidone (INVEGA SUSTENNA) 234 MG/1.5ML SUSP injection Inject 234 mg into the muscle every 30 (thirty) days.   Past Month at Unknown time  . QUEtiapine (SEROQUEL) 300 MG tablet Take 1 tablet (300 mg total) by mouth at bedtime.   Past Month at Unknown time  . simvastatin (ZOCOR) 20 MG tablet Take 1 tablet (20 mg total) by mouth at bedtime. 30 tablet 11 Past Month at Unknown time  . sitaGLIPtin (JANUVIA) 25 MG tablet Take 1 tablet (25 mg total) by mouth daily. 30 tablet 3 Past Month at Unknown time  . triamcinolone cream (KENALOG) 0.1 % Apply 1 application topically at bedtime. Apply to AA. 30 g 0 Past Month at Unknown time  . bisacodyl (DULCOLAX) 10 MG suppository Place 1 suppository (10 mg total) rectally daily as needed for moderate constipation. (Patient not taking: Reported on 03/16/2018) 12 suppository 0 Not Taking at Unknown time  . divalproex (DEPAKOTE) 500 MG DR tablet Take 1 tablet (500 mg total) by mouth 2 (two) times daily with a meal. (Patient not taking: Reported on 03/16/2018) 60 tablet 3 Not Taking at Unknown time  . senna (SENOKOT) 8.6 MG TABS tablet Take 1 tablet (8.6 mg total) by mouth at bedtime as needed for mild constipation. (Patient not taking: Reported on 03/16/2018) 120 each 0 Not Taking at Unknown time    Patient Stressors: Medication change or noncompliance  Patient Strengths: Ability for insight Motivation for treatment/growth Religious Affiliation Supportive family/friends  Treatment Modalities: Medication Management, Group therapy, Case management,  1 to 1 session with clinician,  Psychoeducation, Recreational therapy.   Physician Treatment Plan for Primary Diagnosis: Bipolar I disorder, current or most recent episode manic, with psychotic features ([place]) Long Term Goal(s): Improvement in symptoms so as ready for discharge NA   Short Term Goals: Ability to identify changes in lifestyle to reduce recurrence of condition will improve Ability to verbalize feelings will improve Ability to disclose and discuss suicidal ideas Ability to demonstrate self-control will improve Ability to identify and develop effective coping behaviors will improve Ability to maintain clinical measurements within normal limits will improve Ability to identify triggers associated with substance abuse/mental health issues will improve NA  Medication Management: Evaluate patient's response, side effects, and tolerance of medication regimen.  Therapeutic Interventions: 1 to 1 sessions, Unit Group sessions and Medication administration.  Evaluation of Outcomes: Progressing  Physician Treatment Plan for Secondary Diagnosis: Principal Problem:   Bipolar I disorder, current or most recent episode manic, with psychotic features ([place]) Active Problems:   HTN (hypertension), benign   OSA on CPAP   Osteoarthritis of knee   Thrombocytopenia ([facility])   Acquired hypothyroidism  Long Term Goal(s): Improvement in symptoms so as ready for discharge NA   Short Term Goals: Ability to identify changes in lifestyle to reduce recurrence of condition will improve Ability to verbalize feelings will improve Ability to disclose and discuss suicidal ideas Ability to demonstrate self-control will improve Ability to identify and develop effective coping behaviors will improve Ability to maintain clinical measurements within normal limits will improve Ability to identify triggers associated with substance abuse/mental health issues will improve NA     Medication Management: Evaluate patient's response, side  effects, and tolerance of medication regimen.  Therapeutic Interventions: 1 to 1 sessions, Unit Group sessions and Medication administration.  Evaluation of Outcomes: Progressing   RN Treatment Plan for Primary Diagnosis: Bipolar I disorder, current or most recent episode manic, with psychotic features ([place]) Long Term Goal(s): Knowledge of disease and therapeutic regimen to maintain health will improve  Short Term Goals: Ability to remain free from injury will improve, Ability to demonstrate self-control and Compliance with prescribed medications will improve  Medication Management: RN will administer medications as ordered by provider, will assess and evaluate patient's response and provide education to patient for prescribed medication. RN will report any adverse and/or side effects to prescribing provider.  Therapeutic Interventions: 1 on 1 counseling sessions, Psychoeducation, Medication administration, Evaluate responses to treatment, Monitor vital signs and CBGs as ordered, Perform/monitor CIWA, COWS, AIMS and Fall Risk screenings as ordered, Perform wound care treatments as ordered.  Evaluation of Outcomes: Progressing   LCSW Treatment Plan for Primary Diagnosis: Bipolar I disorder, current or most recent episode manic, with psychotic features ([place]) Long Term Goal(s): Safe transition to appropriate next level of care at discharge, Engage patient in therapeutic group addressing interpersonal concerns.  Short Term Goals: Engage patient in aftercare planning with referrals and resources and Increase skills for wellness and recovery  Therapeutic Interventions: Assess for all discharge needs, 1 to 1 time with Social worker, Explore available resources and support systems, Assess for adequacy in community support network, Educate family and significant other(s) on suicide prevention, Complete Psychosocial Assessment, Interpersonal group therapy.  Evaluation of Outcomes:  Progressing   Progress in Treatment: Attending groups: No. Participating in groups: No. Taking medication as prescribed: Yes. Toleration medication: Yes. Family/Significant other contact made:yes, sister Patient understands diagnosis: Yes. Discussing patient identified problems/goals with staff: Yes. Medical problems stabilized or resolved: Yes. Denies suicidal/homicidal ideation: Yes. Issues/concerns per patient self-inventory: No. Other: n/a  New problem(s) identified: No, Describe:  No new problems identified  New Short Term/Long Term Goal(s):  Patient Goals:  "get my medication right"  Discharge Plan or Barriers: home and follow up with the ACTT team.  Pt is still improving, but still laughing inappropriately with disorganized thinking.   Reason for Continuation of Hospitalization: Mania Medication stabilization  Estimated Length of Stay: 5-7 days  Recreational Therapy: Patient Stressors: N/A Patient Goal: Patient will focus on task/topic with 2 prompts from staff within 5 recreation therapy group sessions  Attendees: Patient:   Physician: Orson Slick, MD 03/26/18  Nursing:  Alyson Locket, RN 03/26/18  RN Care Manager:   Social Worker: Derrek Gu, LCSW 03/26/18  Recreational Therapist:    Other: Darin Engels, [place] 03/26/18  Other: Marney Doctor, Chaplain 03/26/18  Other:      Scribe for Treatment Team: Devona Konig, LCSW 03/26/2018 3:30 PM

## 2018-03-26 NOTE — Progress Notes (Signed)
D:Ptdenies SI/HI/AVH. Pt is pleasant and cooperative, butstillvery manic utilizing pressured speech, flights of ideas, and religous preoccupation. Pt. Denies pain this evening, (despite history of chronic pain).Patient Interactionappropriate with staff and peers, but laughs very loudly to herself periodicallyand has outbursts of loud flights of ideasand random whistling spells still present. Pt. Redirected to try to control random loud outbursts as it causes disruption of the milieu. Pt. Continues to presentveryeuphoric and animated still.Pt. Denies anxiety and or depression and all psychiatric symptoms.  A: Q x 15 minute observation checks were completed for safety. Patient was provided with education, needs reinforcement.Patient was given/offered medications per orders. Patient was encourage to attend groups, participate in unit activities and continue with plan of care. Pt. Chart and plans of care reviewed. Pt. Given support and encouragement.  R:Patient is complaint with medication and unit procedures.Pt. Utilizes CPAP with no complications. Pt. Attends snacks and groups appropriately. Pt. Given extensive education on falls risk safety with front wheel walker. Pt. Chronic pain being managed with PRN medications.           Precautionary checks every 15 minutes for safety maintained, room free of safety hazards, patient sustains no injury or falls during this shift. Will endorse care to next shift.

## 2018-03-26 NOTE — Plan of Care (Signed)
Pt. Participation with groups and snacks good. Pt. Complaint with medications. Pt. Denies Si/hi and verbally is able to contract for safety. Pt. Needs reinforcement on education provided. Pt. Continues to present animated and pressured in speech.    Problem: Education: Goal: Knowledge of [facility] Education information/materials will improve Outcome: Not Progressing Goal: Emotional status will improve Outcome: Not Progressing Goal: Mental status will improve Outcome: Not Progressing   Problem: Activity: Goal: Interest or engagement in activities will improve Outcome: Progressing   Problem: Health Behavior/Discharge Planning: Goal: Compliance with treatment plan for underlying cause of condition will improve Outcome: Progressing   Problem: Safety: Goal: Periods of time without injury will increase Outcome: Progressing   Problem: Safety: Goal: Ability to disclose and discuss suicidal ideas will improve Outcome: Progressing

## 2018-03-26 NOTE — Progress Notes (Signed)
[facility] MD Progress Note  03/26/2018 6:51 PM Gina [name]  MRN:  [id number]  Subjective:   [name] is still manic, loud, robust, goo natured, in expansive mood. Religiously preoccupied. She has no complaints but realizes that she is not well yet. Thinking is still disorganized which she interprets as "dementia" and is working hard on word searches to fend it off.  Most importantly, she is able to tolerates Geodon well so far. No side effects of medications. Complains if knee pain and has been using a walker here.   Principal Problem: Bipolar I disorder, current or most recent episode manic, with psychotic features ([place]) Diagnosis:   Patient Active Problem List   Diagnosis Date Noted  . Bipolar I disorder, current or most recent episode manic, with psychotic features ([place]) [F31.2] 11/04/2017    Priority: High  . Altered mental status, unspecified [R41.82] 03/16/2018  . Hypotension [I95.9] 03/15/2018  . Rash of hands [R21] 02/18/2018  . Pain of right heel [M79.671] 12/30/2017  . Encounter for chronic pain management [G89.29] 12/18/2017  . Foot pain, bilateral [id number], M79.672] 11/25/2017  . Gallbladder polyp [K82.4] 10/21/2017  . Left shoulder pain [M25.512] 10/21/2017  . Benign neoplasm of ascending colon [D12.2]   . Low back pain radiating to left lower extremity [M54.5, M79.605] 02/26/2017  . Acquired hypothyroidism [E03.9] 09/10/2016  . Thrombocytopenia ([place]) [D69.6] 06/15/2016  . Medicare annual wellness visit, subsequent [Z00.00] 06/04/2016  . Advanced care planning/counseling discussion [Z71.89] 06/04/2016  . External hemorrhoid [K64.4] 04/02/2016  . Slow transit constipation [K59.01] 01/04/2016  . Bipolar I disorder, most recent episode (or current) manic ([place]) [F31.10] 12/03/2015  . NAFLD (nonalcoholic fatty liver disease) [K76.0] 03/28/2015  . Gallbladder calculus without cholecystitis [K80.20] 03/28/2015  . Gout [M10.9] 03/27/2015  . HCV antibody positive  [R76.8] 03/27/2015  . History of bladder cancer [Z85.51] 03/27/2015  . History of bundle branch block [Z86.79] 03/27/2015  . Genetic testing [Z13.79] 02/28/2015  . Loss of memory [R41.3] 09/04/2014  . Controlled type 2 diabetes mellitus with diabetic nephropathy ([place]) [E11.21] 06/07/2014  . Hyperlipidemia [E78.5]   . Anemia in CKD (chronic kidney disease) [N18.9, D63.1] 01/26/2013  . Personal history of colonic adenomas and colon cancer [Z86.010] 11/11/2012  . Osteoarthritis of knee [M17.10] 10/02/2012  . OSA on CPAP [G47.33, Z99.89]   . CKD (chronic kidney disease) stage 4, GFR 15-29 ml/min (HCC) [N18.4]   . Severe obesity (BMI 35.0-39.9) with comorbidity ([place]) [E66.01]   . HTN (hypertension), benign [I10] 04/07/2012  . Hx of colon cancer, stage I [Z85.038] 08/05/2011   Total Time spent with patient: 20 minutes  Past Psychiatric History: bipolar disorder  Past Medical History:  Past Medical History:  Diagnosis Date  . Anemia in chronic kidney disease   . Bipolar depression [facility])    sees psychiatrist - psych admission 03/2015  . Bladder cancer ([place]) 1990's  . Blood transfusion without reported diagnosis 2009  . Cataract    left  . CKD (chronic kidney disease) stage 3, GFR 30-59 ml/min (HCC)   . Colon cancer ([place]) 1990's  . DJD (degenerative joint disease), lumbar    chronic lower back pain  . Enterocutaneous fistula 04/07/2012   completed PT 06/2012 ([facility])  . Family history of breast cancer   . Family history of colon cancer   . Family history of stomach cancer   . GERD (gastroesophageal reflux disease)   . History of bladder cancer 1997  . History of colon cancer    s/p surgery  .  History of uterine cancer    s/p hysterectomy  . Hyperlipidemia   . Hypertension   . IDA (iron deficiency anemia) 01/2013   thought due to h/o polyps  . Obesity (BMI 30-39.9)   . OSA on CPAP    6cm H2O  . Personal history of colonic adenomas and colon cancer 11/11/2012  . Positive  hepatitis C antibody test 09/2014   but negative confirmatory testing  . RBBB   . Uncontrolled type 2 diabetes mellitus with nephropathy ([place]) 06/07/2014   completed DSME 02/2016    Past Surgical History:  Procedure Laterality Date  . BREAST BIOPSY Right 01/2014   fibroadenoma  . COLONOSCOPY  07/2009  . COLONOSCOPY  11/2012   2 tubular adenomas, mild diverticulosis, pending genetic testing for Lynch syndrome Gina [name]) rpt 2 yrs  . COLONOSCOPY  02/2015   TA, diverticulosis, rpt 2 yrs Gina [name])  . COLONOSCOPY WITH PROPOFOL N/A 06/09/2017   TAx1, rpt 2 yrs Gina [name], [name], MD)  . DEXA  12/2009   WNL  . DOBUTAMINE STRESS ECHO  12/2009   no evidence of ischemia  . HERNIA REPAIR  02/04/12  . i & d abdominal wound  02/19/12  . LEFT OOPHORECTOMY  2005  . PARTIAL COLECTOMY  about 2008   for colon cancer  . PARTIAL HYSTERECTOMY  1981   uterine cancer, R ovary remains  . Reexploration of abdominal wound and Allograft placemet  02/26/12  . Removal of infected mesh and abdominal wound vac placement  02/24/12  . sleep study  02/2014   OSA - AHI 55, nadir 81% Gina [name])   Family History:  Family History  Problem Relation Age of Onset  . Colon cancer Mother 65  . Stomach cancer Mother        dx in her 37s?  . CAD Father        MI; deceased 57  . Mental illness Sister        anxiety/depression  . Thyroid disease Sister   . Breast cancer Maternal Grandmother        age at dx unknown  . Diabetes Brother   . Diabetes Brother   . Diabetes Other        aunts and uncles both sides  . Arthritis Other        strong fmhx  . Colon cancer Other 67       maternal half-sister; deceased   Family Psychiatric  History: sister with mental illness Social History:  Social History   Substance and Sexual Activity  Alcohol Use No  . Alcohol/week: 0.0 standard drinks     Social History   Substance and Sexual Activity  Drug Use No    Social History   Socioeconomic History  . Marital status: Single     Spouse name: Not on file  . Number of children: 0  . Years of education: Not on file  . Highest education level: Not on file  Occupational History  . Not on file  Social Needs  . Financial resource strain: Not on file  . Food insecurity:    Worry: Not on file    Inability: Not on file  . Transportation needs:    Medical: Not on file    Non-medical: Not on file  Tobacco Use  . Smoking status: Never Smoker  . Smokeless tobacco: Never Used  Substance and Sexual Activity  . Alcohol use: No    Alcohol/week: 0.0 standard drinks  . Drug use: No  . Sexual  activity: Never  Lifestyle  . Physical activity:    Days per week: Not on file    Minutes per session: Not on file  . Stress: Not on file  Relationships  . Social connections:    Talks on phone: Not on file    Gets together: Not on file    Attends religious service: Not on file    Active member of club or organization: Not on file    Attends meetings of clubs or organizations: Not on file    Relationship status: Not on file  Other Topics Concern  . Not on file  Social History Narrative   Lives with sister, no pets   Occupation: disabled, for bipolar and arthritis   Edu: GED   Activity: take walks   Diet: good water, vegetables daily   Religion: [place], Dr. Jimmye Norman (ph [phone])   Additional Social History:                         Sleep: Fair  Appetite:  Good  Current Medications: Current Facility-Administered Medications  Medication Dose Route Frequency Provider Last Rate Last Dose  . acetaminophen (TYLENOL) tablet 650 mg  650 mg Oral Q6H PRN Mikhai Bienvenue B, MD   650 mg at 03/23/18 1319  . alum & mag hydroxide-simeth (MAALOX/MYLANTA) 200-200-20 MG/5ML suspension 30 mL  30 mL Oral Q4H PRN Carrell Rahmani B, MD   30 mL at 03/20/18 1009  . amantadine (SYMMETREL) capsule 100 mg  100 mg Oral BID Ethin Drummond B, MD   100 mg at 03/26/18 1728  .  bisacodyl (DULCOLAX) EC tablet 5 mg  5 mg Oral Daily PRN Weslie Rasmus B, MD   5 mg at 03/18/18 0333  . carbamazepine (TEGRETOL) tablet 200 mg  200 mg Oral BID AC & HS Maston Wight B, MD   200 mg at 03/26/18 0820  . docusate sodium (COLACE) capsule 100 mg  100 mg Oral BID Yassir Enis B, MD   100 mg at 03/26/18 1729  . hydrocortisone (ANUSOL-HC) 2.5 % rectal cream   Rectal QID Noami Bove B, MD      . lamoTRIgine (LAMICTAL) tablet 200 mg  200 mg Oral QHS Jeryl Wilbourn B, MD   200 mg at 03/25/18 2127  . levothyroxine (SYNTHROID, LEVOTHROID) tablet 50 mcg  50 mcg Oral QAC breakfast Cardale Dorer B, MD   50 mcg at 03/26/18 6759  . linagliptin (TRADJENTA) tablet 5 mg  5 mg Oral Daily Shykeem Resurreccion B, MD   5 mg at 03/26/18 0819  . losartan (COZAAR) tablet 50 mg  50 mg Oral Daily Garrison Michie B, MD   50 mg at 03/26/18 0814  . magnesium hydroxide (MILK OF MAGNESIA) suspension 30 mL  30 mL Oral Daily PRN Byard Carranza B, MD   30 mL at 03/18/18 0333  . metoprolol tartrate (LOPRESSOR) tablet 12.5 mg  12.5 mg Oral BID Kaelea Gathright B, MD   12.5 mg at 03/26/18 1732  . polyethylene glycol (MIRALAX / GLYCOLAX) packet 17 g  17 g Oral Daily Janye Maynor B, MD   17 g at 03/25/18 0823  . QUEtiapine (SEROQUEL) tablet 400 mg  400 mg Oral QHS Laureen Frederic B, MD   400 mg at 03/25/18 2127  . simethicone (MYLICON) chewable tablet 80 mg  80 mg Oral QID Nyela Cortinas B, MD   80 mg at 03/26/18  1151  . simvastatin (ZOCOR) tablet 20 mg  20 mg Oral q1800 Romney Compean B, MD   20 mg at 03/26/18 1728  . ziprasidone (GEODON) capsule 80 mg  80 mg Oral BID WC Christop Hippert B, MD   80 mg at 03/26/18 1728    Lab Results:  Results for orders placed or performed during the hospital encounter of 03/16/18 (from the past 48 hour(s))  Glucose, capillary     Status: Abnormal   Collection Time: 03/26/18  7:12 AM  Result Value Ref Range    Glucose-Capillary 104 (H) 70 - 99 mg/dL   Comment 1 Notify RN     Blood Alcohol level:  Lab Results  Component Value Date   ETH <10 03/11/2018   ETH <10 81/77/1165    Metabolic Disorder Labs: Lab Results  Component Value Date   HGBA1C 6.6 (H) 02/18/2018   MPG 122.63 11/14/2017   MPG 114 04/14/2012   Lab Results  Component Value Date   PROLACTIN 33.6 (H) 12/04/2015   Lab Results  Component Value Date   CHOL 140 11/14/2017   TRIG 152 (H) 11/14/2017   HDL 50 11/14/2017   CHOLHDL 2.8 11/14/2017   VLDL 30 11/14/2017   LDLCALC 60 11/14/2017   LDLCALC 81 09/30/2017    Physical Findings: AIMS:  , ,  ,  ,    CIWA:    COWS:     Musculoskeletal: Strength & Muscle Tone: within normal limits Gait & Station: normal Patient leans: N/A  Psychiatric Specialty Exam: Physical Exam  Nursing note and vitals reviewed. Psychiatric: Her affect is labile. Her speech is rapid and/or pressured. She is hyperactive. Thought content is paranoid and delusional. Cognition and memory are impaired. She expresses impulsivity.    Review of Systems  Neurological: Negative.   Psychiatric/Behavioral: Positive for hallucinations.  All other systems reviewed and are negative.   Blood pressure 124/72, pulse 86, temperature 98.2 F (36.8 C), temperature source Oral, resp. rate 18, height 5\' 2"  (1.575 m), weight 87.1 kg, SpO2 100 %.Body mass index is 35.12 kg/m.  General Appearance: Casual  Eye Contact:  Good  Speech:  Clear and Coherent  Volume:  Increased  Mood:  Euphoric  Affect:  Congruent  Thought Process:  Irrelevant and Descriptions of Associations: Tangential  Orientation:  Full (Time, Place, and Person)  Thought Content:  Delusions  Suicidal Thoughts:  No  Homicidal Thoughts:  No  Memory:  Immediate;   Fair Recent;   Fair Remote;   Fair  Judgement:  Fair  Insight:  Present  Psychomotor Activity:  Increased  Concentration:  Concentration: Fair and Attention Span: Fair  Recall:   [employer] of Knowledge:  Fair  Language:  Fair  Akathisia:  No  Handed:  Right  AIMS (if indicated):     Assets:  Communication Skills Desire for Improvement Financial Resources/Insurance Housing Physical Health Resilience Social Support  ADL's:  Intact  Cognition:  WNL  Sleep:  Number of Hours: 7.75     Treatment Plan Summary: Daily contact with patient to assess and evaluate symptoms and progress in treatment and Medication management   Ms. Garbett is a 67 year old female with a history of bipolar disorder admitted for a manic episode in the context of medication changes.She had severe insomnia and was given 30 mg of Restoril tonight causinghypotonia and was briefly hospitalized at ICU. She is still hypomanic. She tolerates transition from Depakote to Lamictal well butmanic symptoms are on the rise.  #Bipolar  disorder,full blown mania now -continue Invega sustenna injection 234 mg every 28 days, next dose on 9/3 -increaseSeroquelto 400mg  nightlyfor mood stabilization -completedDepakote taper due to thrombocytopenia -continue Lamictal to 200 mg nightly -continueTegretol 200 mg BID -increase Geodon to 80 mg BID  #Thrombocytopenia, resolved -Depakotewas discontinued  #Insomnia -Melatonin 10 mg nightly  #Sleep apnea -CPAP nightly  #Hypothyroidism -Continue Synthroid 50 mcg daily  #HTN -ContinueMetoprolol 12.5 mg twice daily and Cozaar 50 mg daily  #DM -Tradjenta 5 mg daily -ADA diet  #Constipation -bowel regimen -Anusol PRN  #Dislipidemia  -Zocor 20 mg daily  #Labs -Hemoglobin A1c was 6.6 and total cholesterol was 140  #Disposition -discharge to home with the sister -follow up with [facility]  Orson Slick, MD 03/26/2018, 6:51 PM

## 2018-03-26 NOTE — [facility] Group Notes (Signed)
[place] Group Notes:  (Nursing/MHT/Case Management/Adjunct)  Date:  03/26/2018  Time:  11:09 PM  Type of Therapy:  Group Therapy  Participation Level:  Active  Participation Quality:  Sharing  Affect:  Excited  Cognitive:  Appropriate  Insight:  Lacking  Engagement in Group:  Engaged  Modes of Intervention:  Education  Summary of Progress/Problems:  Gina [name] 03/26/2018, 11:09 PM

## 2018-03-27 NOTE — Progress Notes (Signed)
[name] MD Progress Note  03/28/2018 9:46 AM Gina [name]  MRN:  [id number]  Subjective:   Gina [name] is at my door first thing in the morning complaining of right hand pain in addition to her knee pain. The hand started hurting this morning making it impossible to hold a cup or utensil at breakfast. There was no injury, no history of gout. The hand is not swollen or red but it hurts on the palm side at rest and when patient moves her fingers. She thinks it is "carpal tunnel" but she has no history of it.   Speech is still pressured and disorganized but according to nursing notes, the patient is less disruptive in the milieu. Tolerates medications well but progress is unsatisfactory.  Principal Problem: Bipolar I disorder, current or most recent episode manic, with psychotic features ([place]) Diagnosis:   Patient Active Problem List   Diagnosis Date Noted  . Bipolar I disorder, current or most recent episode manic, with psychotic features ([place]) [F31.2] 11/04/2017    Priority: High  . Altered mental status, unspecified [R41.82] 03/16/2018  . Hypotension [I95.9] 03/15/2018  . Rash of hands [R21] 02/18/2018  . Pain of right heel [M79.671] 12/30/2017  . Encounter for chronic pain management [G89.29] 12/18/2017  . Foot pain, bilateral [id number], M79.672] 11/25/2017  . Gallbladder polyp [K82.4] 10/21/2017  . Left shoulder pain [M25.512] 10/21/2017  . Benign neoplasm of ascending colon [D12.2]   . Low back pain radiating to left lower extremity [M54.5, M79.605] 02/26/2017  . Acquired hypothyroidism [E03.9] 09/10/2016  . Thrombocytopenia ([place]) [D69.6] 06/15/2016  . Medicare annual wellness visit, subsequent [Z00.00] 06/04/2016  . Advanced care planning/counseling discussion [Z71.89] 06/04/2016  . External hemorrhoid [K64.4] 04/02/2016  . Slow transit constipation [K59.01] 01/04/2016  . Bipolar I disorder, most recent episode (or current) manic ([place]) [F31.10] 12/03/2015  . NAFLD  (nonalcoholic fatty liver disease) [K76.0] 03/28/2015  . Gallbladder calculus without cholecystitis [K80.20] 03/28/2015  . Gout [M10.9] 03/27/2015  . HCV antibody positive [R76.8] 03/27/2015  . History of bladder cancer [Z85.51] 03/27/2015  . History of bundle branch block [Z86.79] 03/27/2015  . Genetic testing [Z13.79] 02/28/2015  . Loss of memory [R41.3] 09/04/2014  . Controlled type 2 diabetes mellitus with diabetic nephropathy ([place]) [E11.21] 06/07/2014  . Hyperlipidemia [E78.5]   . Anemia in CKD (chronic kidney disease) [N18.9, D63.1] 01/26/2013  . Personal history of colonic adenomas and colon cancer [Z86.010] 11/11/2012  . Osteoarthritis of knee [M17.10] 10/02/2012  . OSA on CPAP [G47.33, Z99.89]   . CKD (chronic kidney disease) stage 4, GFR 15-29 ml/min (HCC) [N18.4]   . Severe obesity (BMI 35.0-39.9) with comorbidity ([place]) [E66.01]   . HTN (hypertension), benign [I10] 04/07/2012  . Hx of colon cancer, stage I [Z85.038] 08/05/2011   Total Time spent with patient: 20 minutes  Past Psychiatric History: bipolar disorder  Past Medical History:  Past Medical History:  Diagnosis Date  . Anemia in chronic kidney disease   . Bipolar depression [facility])    sees psychiatrist - psych admission 03/2015  . Bladder cancer ([place]) 1990's  . Blood transfusion without reported diagnosis 2009  . Cataract    left  . CKD (chronic kidney disease) stage 3, GFR 30-59 ml/min (HCC)   . Colon cancer ([place]) 1990's  . DJD (degenerative joint disease), lumbar    chronic lower back pain  . Enterocutaneous fistula 04/07/2012   completed PT 06/2012 ([facility])  . Family history of breast cancer   . Family history of colon  cancer   . Family history of stomach cancer   . GERD (gastroesophageal reflux disease)   . History of bladder cancer 1997  . History of colon cancer    s/p surgery  . History of uterine cancer    s/p hysterectomy  . Hyperlipidemia   . Hypertension   . IDA (iron deficiency anemia)  01/2013   thought due to h/o polyps  . Obesity (BMI 30-39.9)   . OSA on CPAP    6cm H2O  . Personal history of colonic adenomas and colon cancer 11/11/2012  . Positive hepatitis C antibody test 09/2014   but negative confirmatory testing  . RBBB   . Uncontrolled type 2 diabetes mellitus with nephropathy ([place]) 06/07/2014   completed DSME 02/2016    Past Surgical History:  Procedure Laterality Date  . BREAST BIOPSY Right 01/2014   fibroadenoma  . COLONOSCOPY  07/2009  . COLONOSCOPY  11/2012   2 tubular adenomas, mild diverticulosis, pending genetic testing for Lynch syndrome [name]) rpt 2 yrs  . COLONOSCOPY  02/2015   TA, diverticulosis, rpt 2 yrs [name])  . COLONOSCOPY WITH PROPOFOL N/A 06/09/2017   TAx1, rpt 2 yrs [name], [name], MD)  . DEXA  12/2009   WNL  . DOBUTAMINE STRESS ECHO  12/2009   no evidence of ischemia  . HERNIA REPAIR  02/04/12  . i & d abdominal wound  02/19/12  . LEFT OOPHORECTOMY  2005  . PARTIAL COLECTOMY  about 2008   for colon cancer  . PARTIAL HYSTERECTOMY  1981   uterine cancer, R ovary remains  . Reexploration of abdominal wound and Allograft placemet  02/26/12  . Removal of infected mesh and abdominal wound vac placement  02/24/12  . sleep study  02/2014   OSA - AHI 55, nadir 81% [name])   Family History:  Family History  Problem Relation Age of Onset  . Colon cancer Mother 45  . Stomach cancer Mother        dx in her 15s?  . CAD Father        MI; deceased 57  . Mental illness Sister        anxiety/depression  . Thyroid disease Sister   . Breast cancer Maternal Grandmother        age at dx unknown  . Diabetes Brother   . Diabetes Brother   . Diabetes Other        aunts and uncles both sides  . Arthritis Other        strong fmhx  . Colon cancer Other 40       maternal half-sister; deceased   Family Psychiatric  History: sister with mental illness Social History:  Social History   Substance and Sexual Activity  Alcohol Use No  .  Alcohol/week: 0.0 standard drinks     Social History   Substance and Sexual Activity  Drug Use No    Social History   Socioeconomic History  . Marital status: Single    Spouse name: Not on file  . Number of children: 0  . Years of education: Not on file  . Highest education level: Not on file  Occupational History  . Not on file  Social Needs  . Financial resource strain: Not on file  . Food insecurity:    Worry: Not on file    Inability: Not on file  . Transportation needs:    Medical: Not on file    Non-medical: Not on file  Tobacco Use  .  Smoking status: Never Smoker  . Smokeless tobacco: Never Used  Substance and Sexual Activity  . Alcohol use: No    Alcohol/week: 0.0 standard drinks  . Drug use: No  . Sexual activity: Never  Lifestyle  . Physical activity:    Days per week: Not on file    Minutes per session: Not on file  . Stress: Not on file  Relationships  . Social connections:    Talks on phone: Not on file    Gets together: Not on file    Attends religious service: Not on file    Active member of club or organization: Not on file    Attends meetings of clubs or organizations: Not on file    Relationship status: Not on file  Other Topics Concern  . Not on file  Social History Narrative   Lives with sister, no pets   Occupation: disabled, for bipolar and arthritis   Edu: GED   Activity: take walks   Diet: good water, vegetables daily   Religion: Catholic      Psych - [employer], Dr. Jimmye Norman (ph [phone])   Additional Social History:                         Sleep: Fair  Appetite:  Fair  Current Medications: Current Facility-Administered Medications  Medication Dose Route Frequency Provider Last Rate Last Dose  . acetaminophen (TYLENOL) tablet 650 mg  650 mg Oral Q6H PRN Thu Baggett B, MD   650 mg at 03/23/18 1319  . alum & mag hydroxide-simeth (MAALOX/MYLANTA) 200-200-20 MG/5ML suspension 30 mL  30 mL Oral  Q4H PRN Laryn Venning B, MD   30 mL at 03/20/18 1009  . amantadine (SYMMETREL) capsule 100 mg  100 mg Oral BID Zaryia Markel B, MD   100 mg at 03/28/18 0751  . bisacodyl (DULCOLAX) EC tablet 5 mg  5 mg Oral Daily PRN Irby Fails B, MD   5 mg at 03/18/18 0333  . carbamazepine (TEGRETOL) tablet 200 mg  200 mg Oral BID AC & HS Nahlia Hellmann B, MD   200 mg at 03/28/18 0754  . diclofenac (VOLTAREN) EC tablet 75 mg  75 mg Oral BID Dorthea Maina B, MD      . docusate sodium (COLACE) capsule 100 mg  100 mg Oral BID Dody Smartt B, MD   100 mg at 03/28/18 0751  . hydrocortisone (ANUSOL-HC) 2.5 % rectal cream   Rectal QID Taylar Hartsough B, MD      . lamoTRIgine (LAMICTAL) tablet 200 mg  200 mg Oral QHS Sandrika Schwinn B, MD   200 mg at 03/27/18 2149  . levothyroxine (SYNTHROID, LEVOTHROID) tablet 50 mcg  50 mcg Oral QAC breakfast Gabi Mcfate B, MD   50 mcg at 03/28/18 0753  . linagliptin (TRADJENTA) tablet 5 mg  5 mg Oral Daily Ajooni Karam B, MD   5 mg at 03/28/18 0753  . losartan (COZAAR) tablet 50 mg  50 mg Oral Daily Lancer Thurner B, MD   50 mg at 03/28/18 0753  . magnesium hydroxide (MILK OF MAGNESIA) suspension 30 mL  30 mL Oral Daily PRN Hutchinson Isenberg B, MD   30 mL at 03/18/18 0333  . metoprolol tartrate (LOPRESSOR) tablet 12.5 mg  12.5 mg Oral BID Shown Dissinger B, MD   12.5 mg at 03/28/18 0753  . polyethylene glycol (MIRALAX / GLYCOLAX) packet 17 g  17 g Oral Daily Naudia Crosley,  Gilbert Narain B, MD   17 g at 03/27/18 0803  . QUEtiapine (SEROQUEL) tablet 400 mg  400 mg Oral QHS Lenny Fiumara B, MD   400 mg at 03/27/18 2148  . simethicone (MYLICON) chewable tablet 80 mg  80 mg Oral QID Luchiano Viscomi B, MD   80 mg at 03/28/18 0753  . simvastatin (ZOCOR) tablet 20 mg  20 mg Oral q1800 Cory Rama B, MD   20 mg at 03/27/18 1629  . ziprasidone (GEODON) capsule 80 mg  80 mg Oral BID WC Kennita Pavlovich B, MD   80  mg at 03/28/18 0751    Lab Results:  No results found for this or any previous visit (from the past 48 hour(s)).  Blood Alcohol level:  Lab Results  Component Value Date   ETH <10 03/11/2018   ETH <10 44/31/5400    Metabolic Disorder Labs: Lab Results  Component Value Date   HGBA1C 6.6 (H) 02/18/2018   MPG 122.63 11/14/2017   MPG 114 04/14/2012   Lab Results  Component Value Date   PROLACTIN 33.6 (H) 12/04/2015   Lab Results  Component Value Date   CHOL 140 11/14/2017   TRIG 152 (H) 11/14/2017   HDL 50 11/14/2017   CHOLHDL 2.8 11/14/2017   VLDL 30 11/14/2017   LDLCALC 60 11/14/2017   LDLCALC 81 09/30/2017    Physical Findings: AIMS:  , ,  ,  ,    CIWA:    COWS:     Musculoskeletal: Strength & Muscle Tone: within normal limits Gait & Station: normal Patient leans: N/A  Psychiatric Specialty Exam: Physical Exam  Nursing note and vitals reviewed. Psychiatric: Her affect is labile and inappropriate. Her speech is rapid and/or pressured. She is hyperactive. Thought content is delusional. Cognition and memory are impaired. She expresses impulsivity.    Review of Systems  Musculoskeletal: Positive for joint pain.  Neurological: Negative.   Psychiatric/Behavioral: Positive for hallucinations.  All other systems reviewed and are negative.   Blood pressure (!) 158/77, pulse 71, temperature 98.3 F (36.8 C), temperature source Oral, resp. rate 16, height 5\' 2"  (1.575 m), weight 87.1 kg, SpO2 99 %.Body mass index is 35.12 kg/m.  General Appearance: Casual  Eye Contact:  Good  Speech:  Pressured  Volume:  Increased  Mood:  Euphoric  Affect:  Congruent  Thought Process:  Irrelevant and Descriptions of Associations: Tangential  Orientation:  Full (Time, Place, and Person)  Thought Content:  Delusions  Suicidal Thoughts:  No  Homicidal Thoughts:  No  Memory:  Immediate;   Poor Recent;   Poor Remote;   Poor  Judgement:  Poor  Insight:  Shallow  Psychomotor  Activity:  Increased  Concentration:  Concentration: Poor and Attention Span: Poor  Recall:  Poor  Fund of Knowledge:  Fair  Language:  Fair  Akathisia:  No  Handed:  Right  AIMS (if indicated):     Assets:  Communication Skills Desire for Improvement Financial Resources/Insurance Housing Physical Health Resilience Social Support  ADL's:  Intact  Cognition:  WNL  Sleep:  Number of Hours: 5     Treatment Plan Summary: Daily contact with patient to assess and evaluate symptoms and progress in treatment and Medication management   Gina [name] is a 67 year old female with a history of bipolar disorder admitted for a manic episode in the context of medication changes.She had severe insomnia and was given 30 mg of Restoril tonight causinghypotonia and was briefly hospitalized at ICU. She is  still hypomanic. She tolerates transition from Depakote to Lamictal well butmanic symptoms are on the rise.  #Bipolar disorder,full blown mania now -continue Invega sustenna injection 234 mg every 28 days, next dose on 9/3 -increaseSeroquelto 400mg  nightlyfor mood stabilization -completedDepakote taper due to thrombocytopenia -increase Lamictal to 300 mg due to Tegretol  -continueTegretol 200 mg BID -increase Geodon to 80 mg BID  #Thrombocytopenia, resolved -Depakotewas discontinued  #Insomnia, woke up at 3 AM for hand pain -Melatonin 10 mg nightly  #Arthritis -start Voltaren 75 mg BID  #Sleep apnea -CPAP nightly  #Hypothyroidism -Continue Synthroid 50 mcg daily  #HTN -ContinueMetoprolol 12.5 mg twice daily and Cozaar 50 mg daily  #DM -Tradjenta 5 mg daily -ADA diet  #Constipation -bowel regimen -Anusol PRN  #Dislipidemia  -Zocor 20 mg daily  #Labs -Hemoglobin A1c was 6.6 and total cholesterol was 140  #Disposition -discharge to home with the sister -follow up with [facility]  Orson Slick, MD 03/28/2018, 9:46 AM

## 2018-03-27 NOTE — BHH Group Notes (Signed)
[place] LCSW Group Therapy  03/27/2018 3:01 PM  Type of Therapy:  Group Therapy Emotional  Disregulation  Participation Level:  Active  Participation Quality:  Attentive  Affect:  Appropriate  Cognitive:  Appropriate  Insight:  Engaged  Engagement in Therapy:  Engaged  Modes of Intervention:  Discussion, Education, Exploration and Socialization  Summary of Progress/Problems: LCSW for therapy group introduced the discussion with patient on emotional regulation in a family setting. Patient was asked to reflect on a family situation and how they reacted, engaged and coped during their family time.  This patient reflected she has a good relationship with family, but her family situation changed.  She became depressed after losing her parents, she found faith and siblings helped with her grief and loss.   Pauletta Browns, Aslynn Brunetti M 03/27/2018, 3:01 PM

## 2018-03-27 NOTE — Plan of Care (Signed)
  Problem: Activity: Goal: Interest or engagement in activities will improve Outcome: Progressing Goal: Sleeping patterns will improve Outcome: Progressing   Problem: Coping: Goal: Ability to verbalize frustrations and anger appropriately will improve 03/27/2018 1321 by Jacinto Halim, RN Outcome: Progressing 03/27/2018 1305 by Jacinto Halim, RN Outcome: Progressing Goal: Ability to demonstrate self-control will improve 03/27/2018 1321 by Jacinto Halim, RN Outcome: Progressing 03/27/2018 1305 by Jacinto Halim, RN Outcome: Progressing   Problem: Health Behavior/Discharge Planning: Goal: Compliance with treatment plan for underlying cause of condition will improve Outcome: Progressing   Problem: Activity: Goal: Interest or engagement in leisure activities will improve Outcome: Progressing   Problem: Health Behavior/Discharge Planning: Goal: Ability to make decisions will improve Outcome: Progressing Goal: Compliance with therapeutic regimen will improve Outcome: Progressing   Problem: Safety: Goal: Ability to disclose and discuss suicidal ideas will improve 03/27/2018 1321 by Jacinto Halim, RN Outcome: Progressing 03/27/2018 1305 by Jacinto Halim, RN Outcome: Progressing   Problem: Safety: Goal: Ability to remain free from injury will improve Outcome: Progressing

## 2018-03-27 NOTE — Plan of Care (Signed)
Patient continues to be anxious at times. Reports does not need anusol H. Denies any SI, Hi, AVH. Medication and group compliant. Appropriate with staff and peers. Visible in milieu.  Encouragement and support offered. Safety checks maintained. Medications reviewed. Pt receptive and remains safe on unit with q 15 min checks.

## 2018-03-28 MED ORDER — DICLOFENAC SODIUM 75 MG PO TBEC
75.0000 mg | DELAYED_RELEASE_TABLET | Freq: Two times a day (BID) | ORAL | Status: DC
  Administered 2018-03-28 – 2018-04-05 (×16): 75 mg via ORAL
  Filled 2018-03-28 (×17): qty 1

## 2018-03-28 NOTE — Progress Notes (Signed)
D:Ptdenies SI/HI/AVH. Pt is pleasant and cooperative, butstillvery manic utilizing pressured speech, flights of ideas, and religous preoccupation. Pt. Denies pain this evening, (despite history of chronic pain).Patient Interactionappropriate with staff and peers, but laughs very loudly to herself periodicallyand has outbursts of loud flights of ideasand random whistling spellsstill present and unchanged. Pt. Continues to presentveryeuphoric and animatedstill.Pt. Denies anxiety and or depressionand all psychiatric symptoms.   A: Q x 15 minute observation checks were completed for safety. Patient was provided with education, needs reinforcement.Patient was given/offered medications per orders. Patient was encourage to attend groups, participate in unit activities and continue with plan of care. Pt. Chart and plans of care reviewed. Pt. Given support and encouragement.  R:Patient is complaint with medication and unit procedures.Pt. Utilizes CPAP with no complications. Pt. Attends snacks and groups appropriately. Pt. Given extensive education on falls risk safety with front wheel walker. Pt. Chronic pain being managed with PRN medications.            Precautionary checks every 15 minutes for safety maintained, room free of safety hazards, patient sustains no injury or falls during this shift. Will endorse care to next shift.

## 2018-03-28 NOTE — Progress Notes (Signed)
D- Patient alert and oriented. Patient presents in a pleasant mood on assessment stating that she slept "ok until 3 am". Patient was very preoccupied and had complaints of her left knee hurting "I need someone to look at my knee, and my hand, I think it's carpel tunnel, I don't know much about it". Patient denies SI, HI, AVH, at this time stating "I want to go to heaven". Patient also denies any signs/symptoms of depression/anxiety stating to this writer "I don't have any depression, I like to laugh". Patient's goal for today is "to get well, get back to my works".  A- Scheduled medications administered to patient, per MD orders. Support and encouragement provided.  Routine safety checks conducted every 15 minutes.  Patient informed to notify staff with problems or concerns.  R- No adverse drug reactions noted. Patient contracts for safety at this time. Patient compliant with medications and treatment plan. Patient receptive, calm, and cooperative. Patient interacts well with others on the unit.  Patient remains safe at this time.

## 2018-03-28 NOTE — Plan of Care (Signed)
Pt. Complaint with medications. Pt. Denies Si/hi and verbally is able to contract for safety. Pt. Reports she can remain safe while on the unit. Pt. Needs reinforcement on provided education. Pt. Continues to present with pressured speech and religous preoccupation. Pt. Still animated and euphoric.    Problem: Education: Goal: Knowledge of [facility] Education information/materials will improve Outcome: Not Progressing Goal: Emotional status will improve Outcome: Not Progressing Goal: Mental status will improve Outcome: Not Progressing   Problem: Health Behavior/Discharge Planning: Goal: Compliance with treatment plan for underlying cause of condition will improve 03/28/2018 2322 by Reyes Ivan, RN Outcome: Progressing 03/28/2018 2318 by Reyes Ivan, RN Outcome: Progressing   Problem: Safety: Goal: Periods of time without injury will increase 03/28/2018 2322 by Reyes Ivan, RN Outcome: Progressing 03/28/2018 2318 by Reyes Ivan, RN Outcome: Progressing   Problem: Safety: Goal: Ability to disclose and discuss suicidal ideas will improve 03/28/2018 2322 by Reyes Ivan, RN Outcome: Progressing 03/28/2018 2318 by Reyes Ivan, RN Outcome: Progressing

## 2018-03-28 NOTE — Plan of Care (Signed)
Patient was cooperative with treatment and was medication compliant. She was in her bed resting most of the shift, minimal interaction with peers and staff on shift. In bed resting quietly at this time.

## 2018-03-28 NOTE — BHH Group Notes (Signed)
[place] [facility] LCSW Group Therapy  03/28/2018 2 pm  Type of Therapy:  Group Therapy (Open discussion) Feeling about returning home  Participation Level:  Active  Participation Quality:  Attentive  Affect:  Appropriate  Cognitive:  Appropriate  Insight:  Engaged  Engagement in Therapy:  Engaged  Modes of Intervention:  Discussion, Education, Exploration and Socialization  Summary of Progress/Problems:   LCSW for therapy group introduced the discussion with patients reflecting on  topics and or a stress they could share  openly with the group. Patient was asked to reflect on a barriers  they face and how they reacted, engaged and coped during this situation.   Patient was very talkative and when her peers said yes and agreed with her she acknowledged them and kept on talking.... However she is developing some insight of talking all the time and was able to listen better today. Her topic today was " I believe in miracles" Her peers were supportive and a good discussion started. After her lengthy talking she was gently redirected and group was refocused.   [name] 03/28/2018

## 2018-03-28 NOTE — Progress Notes (Signed)
[facility] MD Progress Note  03/29/2018 10:07 AM Gina [name]  MRN:  [id number]  Subjective:    Ms. Stukes continues in her expansive mood with loud and pressured speech, outbursts of laughter, religious preoccupation and intrusiveness. She accepts medications and tolerates them well but is on polypharmacy with limited benefits. IT has been very difficult to substitute Depakote. We strongly consider Clozapine if no improvement.  The patient herself has no complains about feeling "happy". Her sister, with whom she lives, has been frightened with patient's demeanor and lack of progress.   Principal Problem: Bipolar I disorder, current or most recent episode manic, with psychotic features ([place]) Diagnosis:   Patient Active Problem List   Diagnosis Date Noted  . Bipolar I disorder, current or most recent episode manic, with psychotic features ([place]) [F31.2] 11/04/2017    Priority: High  . Altered mental status, unspecified [R41.82] 03/16/2018  . Hypotension [I95.9] 03/15/2018  . Rash of hands [R21] 02/18/2018  . Pain of right heel [M79.671] 12/30/2017  . Encounter for chronic pain management [G89.29] 12/18/2017  . Foot pain, bilateral [id number], M79.672] 11/25/2017  . Gallbladder polyp [K82.4] 10/21/2017  . Left shoulder pain [M25.512] 10/21/2017  . Benign neoplasm of ascending colon [D12.2]   . Low back pain radiating to left lower extremity [M54.5, M79.605] 02/26/2017  . Acquired hypothyroidism [E03.9] 09/10/2016  . Thrombocytopenia ([place]) [D69.6] 06/15/2016  . Medicare annual wellness visit, subsequent [Z00.00] 06/04/2016  . Advanced care planning/counseling discussion [Z71.89] 06/04/2016  . External hemorrhoid [K64.4] 04/02/2016  . Slow transit constipation [K59.01] 01/04/2016  . Bipolar I disorder, most recent episode (or current) manic ([place]) [F31.10] 12/03/2015  . NAFLD (nonalcoholic fatty liver disease) [K76.0] 03/28/2015  . Gallbladder calculus without cholecystitis [K80.20]  03/28/2015  . Gout [M10.9] 03/27/2015  . HCV antibody positive [R76.8] 03/27/2015  . History of bladder cancer [Z85.51] 03/27/2015  . History of bundle branch block [Z86.79] 03/27/2015  . Genetic testing [Z13.79] 02/28/2015  . Loss of memory [R41.3] 09/04/2014  . Controlled type 2 diabetes mellitus with diabetic nephropathy ([place]) [E11.21] 06/07/2014  . Hyperlipidemia [E78.5]   . Anemia in CKD (chronic kidney disease) [N18.9, D63.1] 01/26/2013  . Personal history of colonic adenomas and colon cancer [Z86.010] 11/11/2012  . Osteoarthritis of knee [M17.10] 10/02/2012  . OSA on CPAP [G47.33, Z99.89]   . CKD (chronic kidney disease) stage 4, GFR 15-29 ml/min (HCC) [N18.4]   . Severe obesity (BMI 35.0-39.9) with comorbidity ([place]) [E66.01]   . HTN (hypertension), benign [I10] 04/07/2012  . Hx of colon cancer, stage I [Z85.038] 08/05/2011   Total Time spent with patient: 20 minutes  Past Psychiatric History: bipolar disorder  Past Medical History:  Past Medical History:  Diagnosis Date  . Anemia in chronic kidney disease   . Bipolar depression [facility])    sees psychiatrist - psych admission 03/2015  . Bladder cancer ([place]) 1990's  . Blood transfusion without reported diagnosis 2009  . Cataract    left  . CKD (chronic kidney disease) stage 3, GFR 30-59 ml/min (HCC)   . Colon cancer ([place]) 1990's  . DJD (degenerative joint disease), lumbar    chronic lower back pain  . Enterocutaneous fistula 04/07/2012   completed PT 06/2012 ([facility])  . Family history of breast cancer   . Family history of colon cancer   . Family history of stomach cancer   . GERD (gastroesophageal reflux disease)   . History of bladder cancer 1997  . History of colon cancer    s/p  surgery  . History of uterine cancer    s/p hysterectomy  . Hyperlipidemia   . Hypertension   . IDA (iron deficiency anemia) 01/2013   thought due to h/o polyps  . Obesity (BMI 30-39.9)   . OSA on CPAP    6cm H2O  . Personal history  of colonic adenomas and colon cancer 11/11/2012  . Positive hepatitis C antibody test 09/2014   but negative confirmatory testing  . RBBB   . Uncontrolled type 2 diabetes mellitus with nephropathy ([place]) 06/07/2014   completed DSME 02/2016    Past Surgical History:  Procedure Laterality Date  . BREAST BIOPSY Right 01/2014   fibroadenoma  . COLONOSCOPY  07/2009  . COLONOSCOPY  11/2012   2 tubular adenomas, mild diverticulosis, pending genetic testing for Lynch syndrome [name]) rpt 2 yrs  . COLONOSCOPY  02/2015   TA, diverticulosis, rpt 2 yrs [name])  . COLONOSCOPY WITH PROPOFOL N/A 06/09/2017   TAx1, rpt 2 yrs [name], [name], MD)  . DEXA  12/2009   WNL  . DOBUTAMINE STRESS ECHO  12/2009   no evidence of ischemia  . HERNIA REPAIR  02/04/12  . i & d abdominal wound  02/19/12  . LEFT OOPHORECTOMY  2005  . PARTIAL COLECTOMY  about 2008   for colon cancer  . PARTIAL HYSTERECTOMY  1981   uterine cancer, R ovary remains  . Reexploration of abdominal wound and Allograft placemet  02/26/12  . Removal of infected mesh and abdominal wound vac placement  02/24/12  . sleep study  02/2014   OSA - AHI 55, nadir 81% [name])   Family History:  Family History  Problem Relation Age of Onset  . Colon cancer Mother 39  . Stomach cancer Mother        dx in her 51s?  . CAD Father        MI; deceased 67  . Mental illness Sister        anxiety/depression  . Thyroid disease Sister   . Breast cancer Maternal Grandmother        age at dx unknown  . Diabetes Brother   . Diabetes Brother   . Diabetes Other        aunts and uncles both sides  . Arthritis Other        strong fmhx  . Colon cancer Other 73       maternal half-sister; deceased   Family Psychiatric  History: sister with mental illness Social History:  Social History   Substance and Sexual Activity  Alcohol Use No  . Alcohol/week: 0.0 standard drinks     Social History   Substance and Sexual Activity  Drug Use No    Social  History   Socioeconomic History  . Marital status: Single    Spouse name: Not on file  . Number of children: 0  . Years of education: Not on file  . Highest education level: Not on file  Occupational History  . Not on file  Social Needs  . Financial resource strain: Not on file  . Food insecurity:    Worry: Not on file    Inability: Not on file  . Transportation needs:    Medical: Not on file    Non-medical: Not on file  Tobacco Use  . Smoking status: Never Smoker  . Smokeless tobacco: Never Used  Substance and Sexual Activity  . Alcohol use: No    Alcohol/week: 0.0 standard drinks  . Drug use: No  .  Sexual activity: Never  Lifestyle  . Physical activity:    Days per week: Not on file    Minutes per session: Not on file  . Stress: Not on file  Relationships  . Social connections:    Talks on phone: Not on file    Gets together: Not on file    Attends religious service: Not on file    Active member of club or organization: Not on file    Attends meetings of clubs or organizations: Not on file    Relationship status: Not on file  Other Topics Concern  . Not on file  Social History Narrative   Lives with sister, no pets   Occupation: disabled, for bipolar and arthritis   Edu: GED   Activity: take walks   Diet: good water, vegetables daily   Religion: [place], Dr. Jimmye Norman (ph [phone])   Additional Social History:                         Sleep: Poor  Appetite:  Fair  Current Medications: Current Facility-Administered Medications  Medication Dose Route Frequency Provider Last Rate Last Dose  . acetaminophen (TYLENOL) tablet 650 mg  650 mg Oral Q6H PRN Jayliah Benett B, MD   650 mg at 03/23/18 1319  . alum & mag hydroxide-simeth (MAALOX/MYLANTA) 200-200-20 MG/5ML suspension 30 mL  30 mL Oral Q4H PRN Felton Buczynski B, MD   30 mL at 03/20/18 1009  . amantadine (SYMMETREL) capsule 100 mg  100 mg Oral  BID Lachlyn Vanderstelt B, MD   100 mg at 03/29/18 0841  . bisacodyl (DULCOLAX) EC tablet 5 mg  5 mg Oral Daily PRN Janaye Corp B, MD   5 mg at 03/18/18 0333  . carbamazepine (TEGRETOL) tablet 200 mg  200 mg Oral BID AC & HS Haley Fuerstenberg B, MD   200 mg at 03/29/18 0845  . diclofenac (VOLTAREN) EC tablet 75 mg  75 mg Oral BID Sherlynn Tourville B, MD   75 mg at 03/29/18 0841  . docusate sodium (COLACE) capsule 100 mg  100 mg Oral BID Shakeena Kafer B, MD   100 mg at 03/29/18 0841  . hydrocortisone (ANUSOL-HC) 2.5 % rectal cream   Rectal QID Juelz Claar B, MD      . lamoTRIgine (LAMICTAL) tablet 200 mg  200 mg Oral QHS Veronique Warga B, MD   200 mg at 03/28/18 2122  . levothyroxine (SYNTHROID, LEVOTHROID) tablet 50 mcg  50 mcg Oral QAC breakfast Jenelle Drennon B, MD   50 mcg at 03/29/18 0709  . linagliptin (TRADJENTA) tablet 5 mg  5 mg Oral Daily Zaiyden Strozier B, MD   5 mg at 03/29/18 0841  . losartan (COZAAR) tablet 50 mg  50 mg Oral Daily Kiyomi Pallo B, MD   50 mg at 03/29/18 0841  . magnesium hydroxide (MILK OF MAGNESIA) suspension 30 mL  30 mL Oral Daily PRN Tache Bobst B, MD   30 mL at 03/18/18 0333  . metoprolol tartrate (LOPRESSOR) tablet 12.5 mg  12.5 mg Oral BID Daiton Cowles B, MD   12.5 mg at 03/29/18 0841  . polyethylene glycol (MIRALAX / GLYCOLAX) packet 17 g  17 g Oral Daily Markita Stcharles B, MD   17 g at 03/27/18 0803  . QUEtiapine (SEROQUEL) tablet 400 mg  400 mg Oral QHS Loanne Emery B, MD   400 mg at  03/28/18 2122  . simethicone (MYLICON) chewable tablet 80 mg  80 mg Oral QID Syreeta Figler B, MD   80 mg at 03/29/18 0841  . simvastatin (ZOCOR) tablet 20 mg  20 mg Oral q1800 Ocia Simek B, MD   20 mg at 03/28/18 1652  . ziprasidone (GEODON) capsule 80 mg  80 mg Oral BID WC Stone Spirito B, MD   80 mg at 03/29/18 0841    Lab Results: No results found for this or any previous visit (from  the past 48 hour(s)).  Blood Alcohol level:  Lab Results  Component Value Date   ETH <10 03/11/2018   ETH <10 75/91/6384    Metabolic Disorder Labs: Lab Results  Component Value Date   HGBA1C 6.6 (H) 02/18/2018   MPG 122.63 11/14/2017   MPG 114 04/14/2012   Lab Results  Component Value Date   PROLACTIN 33.6 (H) 12/04/2015   Lab Results  Component Value Date   CHOL 140 11/14/2017   TRIG 152 (H) 11/14/2017   HDL 50 11/14/2017   CHOLHDL 2.8 11/14/2017   VLDL 30 11/14/2017   LDLCALC 60 11/14/2017   LDLCALC 81 09/30/2017    Physical Findings: AIMS:  , ,  ,  ,    CIWA:    COWS:     Musculoskeletal: Strength & Muscle Tone: within normal limits Gait & Station: normal Patient leans: N/A  Psychiatric Specialty Exam: Physical Exam  Nursing note and vitals reviewed. Psychiatric: Her affect is labile and inappropriate. Her speech is rapid and/or pressured. She is hyperactive. Thought content is delusional. Cognition and memory are impaired. She expresses impulsivity.    Review of Systems  Neurological: Negative.   Psychiatric/Behavioral: Positive for hallucinations.  All other systems reviewed and are negative.   Blood pressure (!) 149/74, pulse 70, temperature 99.3 F (37.4 C), temperature source Oral, resp. rate 16, height 5\' 2"  (1.575 m), weight 87.1 kg, SpO2 100 %.Body mass index is 35.12 kg/m.  General Appearance: Casual  Eye Contact:  Good  Speech:  Pressured  Volume:  Increased  Mood:  Euphoric  Affect:  Congruent  Thought Process:  Goal Directed and Descriptions of Associations: Intact  Orientation:  Full (Time, Place, and Person)  Thought Content:  Delusions  Suicidal Thoughts:  No  Homicidal Thoughts:  No  Memory:  Immediate;   Fair Recent;   Fair Remote;   Fair  Judgement:  Impaired  Insight:  Shallow  Psychomotor Activity:  Increased  Concentration:  Concentration: Fair and Attention Span: Fair  Recall:  [employer] of Knowledge:  Fair   Language:  Fair  Akathisia:  No  Handed:  Right  AIMS (if indicated):     Assets:  Communication Skills Desire for Improvement Financial Resources/Insurance Housing Physical Health Resilience Social Support  ADL's:  Intact  Cognition:  WNL  Sleep:  Number of Hours: 7.45     Treatment Plan Summary: Daily contact with patient to assess and evaluate symptoms and progress in treatment and Medication management   Ms. Bednarz is a 67 year old female with a history of bipolar disorder admitted for a manic episode in the context of medication changes.She had severe insomnia and was given 30 mg of Restoril tonight causinghypotonia and was briefly hospitalized at ICU. She is still manic. She tolerates transition from Depakote to Lamictal well butmanic symptoms are on the rise.  #Bipolar disorder,full blown mania now -continue Invega sustenna injection 234 mg every 28 days, next dose on 9/3 (this is  of doubtful benefit) -continue Seroquelto 400mg  nightlyfor mood stabilization, not effective either -completedDepakote taper due to thrombocytopenia -continue increasing Lamictal to 400 mg tonight due to Tegretol  -continueTegretol 200 mg BID -continue Geodon to 80 mg BID  #Thrombocytopenia, resolved -Depakotewas discontinued  #Insomnia, slept 7 hours -Melatonin 10 mg nightly  #Arthritis -continue Voltaren 75 mg BID  #Sleep apnea -CPAP nightly  #Hypothyroidism -Continue Synthroid 50 mcg daily  #HTN, BP elevated -ContinueMetoprolol 12.5 mg twice daily and Cozaar 50 mg daily  #DM -Tradjenta 5 mg daily -ADA diet  #Constipation -bowel regimen -Anusol PRN  #Dislipidemia  -Zocor 20 mg daily  #Labs -Hemoglobin A1c was 6.6 and total cholesterol was 140  #Disposition -discharge to home with the sister -follow up with [facility]  Orson Slick, MD 03/29/2018, 10:07 AM

## 2018-03-28 NOTE — Plan of Care (Signed)
Patient verbalizes understanding of the general information that's been provided to her and all questions/concerns have been addressed and answered at this time. Patient denies SI/HI/AVH stating "I want to go to heaven" as well as any signs/symptoms of depression/anxiety at this time stating "I don't have any depression, I like to laugh". Patient continues to show interest in unit activities by attending/participating in unit groups, being present in the milieu, as well as going outside to the courtyard with staff and other members on the unit without any issues at this time. Patient has the ability to verbalize her anger and frustrations in an appropriate manner and continues to demonstrate self-control on the unit. Patient verbalizes understanding and has been in compliance with her prescribed therapeutic/medication regimen. Patient has been free from injury and remains safe on the unit at this time.  Problem: Education: Goal: Knowledge of [facility] Education information/materials will improve Outcome: Progressing Goal: Emotional status will improve Outcome: Progressing Goal: Mental status will improve Outcome: Progressing Goal: Verbalization of understanding the information provided will improve Outcome: Progressing   Problem: Activity: Goal: Interest or engagement in activities will improve Outcome: Progressing Goal: Sleeping patterns will improve Outcome: Progressing   Problem: Coping: Goal: Ability to verbalize frustrations and anger appropriately will improve Outcome: Progressing Goal: Ability to demonstrate self-control will improve Outcome: Progressing   Problem: Health Behavior/Discharge Planning: Goal: Compliance with treatment plan for underlying cause of condition will improve Outcome: Progressing   Problem: Safety: Goal: Periods of time without injury will increase Outcome: Progressing   Problem: Activity: Goal: Interest or engagement in leisure activities  will improve Outcome: Progressing Goal: Imbalance in normal sleep/wake cycle will improve Outcome: Progressing   Problem: Health Behavior/Discharge Planning: Goal: Ability to make decisions will improve Outcome: Progressing Goal: Compliance with therapeutic regimen will improve Outcome: Progressing   Problem: Safety: Goal: Ability to disclose and discuss suicidal ideas will improve Outcome: Progressing Goal: Ability to identify and utilize support systems that promote safety will improve Outcome: Progressing   Problem: Safety: Goal: Ability to remain free from injury will improve Outcome: Progressing

## 2018-03-28 NOTE — Progress Notes (Signed)
Patient ID: Gina [name], female   DOB: Mar 18, 1951, 67 y.o.   MRN: [id number] PER STATE REGULATIONS 482.30  THIS CHART WAS REVIEWED FOR MEDICAL NECESSITY WITH RESPECT TO THE PATIENT'S ADMISSION/DURATION OF STAY.  NEXT REVIEW DATE: 04/01/18  Roma Schanz, RN, BSN CASE MANAGER

## 2018-03-28 NOTE — [facility] Group Notes (Signed)
[facility] Group Notes:  (Nursing/MHT/Case Management/Adjunct)  Date:  03/28/2018  Time:  9:16 PM  Type of Therapy:  Group  Participation Level:  Active  Participation Quality:  Sharing  Affect:  Appropriate  Cognitive:  Appropriate  Insight:  Good  Engagement in Group:  Engaged  Modes of Intervention:  Support  Summary of Progress/Problems:  Gina [name] 03/28/2018, 9:16 PM

## 2018-03-29 MED ORDER — METOPROLOL TARTRATE 25 MG PO TABS
12.5000 mg | ORAL_TABLET | Freq: Once | ORAL | Status: AC
  Administered 2018-03-29: 12.5 mg via ORAL
  Filled 2018-03-29: qty 1

## 2018-03-29 MED ORDER — METOPROLOL TARTRATE 25 MG PO TABS: 25.0000 mg | ORAL_TABLET | Freq: Two times a day (BID) | ORAL | Status: DC

## 2018-03-29 MED ORDER — METOPROLOL TARTRATE 25 MG PO TABS
25.0000 mg | ORAL_TABLET | Freq: Two times a day (BID) | ORAL | Status: DC
  Administered 2018-03-30: 25 mg via ORAL
  Filled 2018-03-29 (×2): qty 1

## 2018-03-29 NOTE — Progress Notes (Signed)
D- Patient alert and oriented. Patient presents in a pleasant mood on assessment stating that she slept "a little better" last night and continues to endorse pain in her left knee, rating it a "10/10". Patient did not request any pain medication outside of her scheduled prescriptions from this writer. Patient denies SI, HI, AVH, at this time stating "I don't believe in suicidal thoughts, I want to go to heaven". Patient's goal for today is to "get better and get back to my works".  A- Scheduled medications administered to patient, per MD orders. Support and encouragement provided.  Routine safety checks conducted every 15 minutes.  Patient informed to notify staff with problems or concerns.  R- No adverse drug reactions noted. Patient contracts for safety at this time. Patient compliant with medications and treatment plan. Patient receptive, calm, and cooperative. Patient interacts well with others on the unit.  Patient remains safe at this time.

## 2018-03-29 NOTE — Progress Notes (Signed)
[facility] MD Progress Note  03/30/2018 1:38 PM Latika Kronick  MRN:  [id number]  Subjective:   Ms. Roehm is loud, intrusive, expansive and disorganized. She could not sleep at all last night. Unfortunately, all the medicines she has been taking, do not substitute for mood stabilizing effects of Depakote.  Principal Problem: Bipolar I disorder, current or most recent episode manic, with psychotic features ([place]) Diagnosis:   Patient Active Problem List   Diagnosis Date Noted  . Bipolar I disorder, current or most recent episode manic, with psychotic features ([place]) [F31.2] 11/04/2017    Priority: High  . Altered mental status, unspecified [R41.82] 03/16/2018  . Hypotension [I95.9] 03/15/2018  . Rash of hands [R21] 02/18/2018  . Pain of right heel [M79.671] 12/30/2017  . Encounter for chronic pain management [G89.29] 12/18/2017  . Foot pain, bilateral [id number], M79.672] 11/25/2017  . Gallbladder polyp [K82.4] 10/21/2017  . Left shoulder pain [M25.512] 10/21/2017  . Benign neoplasm of ascending colon [D12.2]   . Low back pain radiating to left lower extremity [M54.5, M79.605] 02/26/2017  . Acquired hypothyroidism [E03.9] 09/10/2016  . Thrombocytopenia ([place]) [D69.6] 06/15/2016  . Medicare annual wellness visit, subsequent [Z00.00] 06/04/2016  . Advanced care planning/counseling discussion [Z71.89] 06/04/2016  . External hemorrhoid [K64.4] 04/02/2016  . Slow transit constipation [K59.01] 01/04/2016  . Bipolar I disorder, most recent episode (or current) manic ([place]) [F31.10] 12/03/2015  . NAFLD (nonalcoholic fatty liver disease) [K76.0] 03/28/2015  . Gallbladder calculus without cholecystitis [K80.20] 03/28/2015  . Gout [M10.9] 03/27/2015  . HCV antibody positive [R76.8] 03/27/2015  . History of bladder cancer [Z85.51] 03/27/2015  . History of bundle branch block [Z86.79] 03/27/2015  . Genetic testing [Z13.79] 02/28/2015  . Loss of memory [R41.3] 09/04/2014  . Controlled type 2  diabetes mellitus with diabetic nephropathy ([place]) [E11.21] 06/07/2014  . Hyperlipidemia [E78.5]   . Anemia in CKD (chronic kidney disease) [N18.9, D63.1] 01/26/2013  . Personal history of colonic adenomas and colon cancer [Z86.010] 11/11/2012  . Osteoarthritis of knee [M17.10] 10/02/2012  . OSA on CPAP [G47.33, Z99.89]   . CKD (chronic kidney disease) stage 4, GFR 15-29 ml/min (HCC) [N18.4]   . Severe obesity (BMI 35.0-39.9) with comorbidity ([place]) [E66.01]   . HTN (hypertension), benign [I10] 04/07/2012  . Hx of colon cancer, stage I [Z85.038] 08/05/2011   Total Time spent with patient: 20 minutes  Past Psychiatric History: bipolar disorder  Past Medical History:  Past Medical History:  Diagnosis Date  . Anemia in chronic kidney disease   . Bipolar depression [facility])    sees psychiatrist - psych admission 03/2015  . Bladder cancer ([place]) 1990's  . Blood transfusion without reported diagnosis 2009  . Cataract    left  . CKD (chronic kidney disease) stage 3, GFR 30-59 ml/min (HCC)   . Colon cancer ([place]) 1990's  . DJD (degenerative joint disease), lumbar    chronic lower back pain  . Enterocutaneous fistula 04/07/2012   completed PT 06/2012 ([facility])  . Family history of breast cancer   . Family history of colon cancer   . Family history of stomach cancer   . GERD (gastroesophageal reflux disease)   . History of bladder cancer 1997  . History of colon cancer    s/p surgery  . History of uterine cancer    s/p hysterectomy  . Hyperlipidemia   . Hypertension   . IDA (iron deficiency anemia) 01/2013   thought due to h/o polyps  . Obesity (BMI 30-39.9)   . OSA  on CPAP    6cm H2O  . Personal history of colonic adenomas and colon cancer 11/11/2012  . Positive hepatitis C antibody test 09/2014   but negative confirmatory testing  . RBBB   . Uncontrolled type 2 diabetes mellitus with nephropathy ([place]) 06/07/2014   completed DSME 02/2016    Past Surgical History:  Procedure  Laterality Date  . BREAST BIOPSY Right 01/2014   fibroadenoma  . COLONOSCOPY  07/2009  . COLONOSCOPY  11/2012   2 tubular adenomas, mild diverticulosis, pending genetic testing for Lynch syndrome [name]) rpt 2 yrs  . COLONOSCOPY  02/2015   TA, diverticulosis, rpt 2 yrs [name])  . COLONOSCOPY WITH PROPOFOL N/A 06/09/2017   TAx1, rpt 2 yrs [name], [name], MD)  . DEXA  12/2009   WNL  . DOBUTAMINE STRESS ECHO  12/2009   no evidence of ischemia  . HERNIA REPAIR  02/04/12  . i & d abdominal wound  02/19/12  . LEFT OOPHORECTOMY  2005  . PARTIAL COLECTOMY  about 2008   for colon cancer  . PARTIAL HYSTERECTOMY  1981   uterine cancer, R ovary remains  . Reexploration of abdominal wound and Allograft placemet  02/26/12  . Removal of infected mesh and abdominal wound vac placement  02/24/12  . sleep study  02/2014   OSA - AHI 55, nadir 81% [name])   Family History:  Family History  Problem Relation Age of Onset  . Colon cancer Mother 49  . Stomach cancer Mother        dx in her 2s?  . CAD Father        MI; deceased 2  . Mental illness Sister        anxiety/depression  . Thyroid disease Sister   . Breast cancer Maternal Grandmother        age at dx unknown  . Diabetes Brother   . Diabetes Brother   . Diabetes Other        aunts and uncles both sides  . Arthritis Other        strong fmhx  . Colon cancer Other 5       maternal half-sister; deceased   Family Psychiatric  History: none Social History:  Social History   Substance and Sexual Activity  Alcohol Use No  . Alcohol/week: 0.0 standard drinks     Social History   Substance and Sexual Activity  Drug Use No    Social History   Socioeconomic History  . Marital status: Single    Spouse name: Not on file  . Number of children: 0  . Years of education: Not on file  . Highest education level: Not on file  Occupational History  . Not on file  Social Needs  . Financial resource strain: Not on file  . Food  insecurity:    Worry: Not on file    Inability: Not on file  . Transportation needs:    Medical: Not on file    Non-medical: Not on file  Tobacco Use  . Smoking status: Never Smoker  . Smokeless tobacco: Never Used  Substance and Sexual Activity  . Alcohol use: No    Alcohol/week: 0.0 standard drinks  . Drug use: No  . Sexual activity: Never  Lifestyle  . Physical activity:    Days per week: Not on file    Minutes per session: Not on file  . Stress: Not on file  Relationships  . Social connections:    Talks on  phone: Not on file    Gets together: Not on file    Attends religious service: Not on file    Active member of club or organization: Not on file    Attends meetings of clubs or organizations: Not on file    Relationship status: Not on file  Other Topics Concern  . Not on file  Social History Narrative   Lives with sister, no pets   Occupation: disabled, for bipolar and arthritis   Edu: GED   Activity: take walks   Diet: good water, vegetables daily   Religion: Catholic      Psych - [employer], Dr. Jimmye Norman (ph [phone])   Additional Social History:                         Sleep: Fair  Appetite:  Fair  Current Medications: Current Facility-Administered Medications  Medication Dose Route Frequency Provider Last Rate Last Dose  . acetaminophen (TYLENOL) tablet 650 mg  650 mg Oral Q6H PRN Deniqua Perry B, MD   650 mg at 03/23/18 1319  . alum & mag hydroxide-simeth (MAALOX/MYLANTA) 200-200-20 MG/5ML suspension 30 mL  30 mL Oral Q4H PRN Casey Maxfield,  B, MD   30 mL at 03/20/18 1009  . amantadine (SYMMETREL) capsule 100 mg  100 mg Oral BID Evin Chirco B, MD   100 mg at 03/30/18 0747  . bisacodyl (DULCOLAX) EC tablet 5 mg  5 mg Oral Daily PRN Kazue Cerro B, MD   5 mg at 03/18/18 0333  . carbamazepine (TEGRETOL) tablet 200 mg  200 mg Oral BID AC & HS Alichia Alridge B, MD   200 mg at 03/30/18 0748  .  diclofenac (VOLTAREN) EC tablet 75 mg  75 mg Oral BID Mozella Rexrode B, MD   75 mg at 03/30/18 0747  . docusate sodium (COLACE) capsule 100 mg  100 mg Oral BID Sonu Kruckenberg B, MD   100 mg at 03/30/18 0747  . hydrocortisone (ANUSOL-HC) 2.5 % rectal cream   Rectal QID Tennille Montelongo B, MD      . lamoTRIgine (LAMICTAL) tablet 200 mg  200 mg Oral QHS Taquisha Phung B, MD   200 mg at 03/29/18 2155  . levothyroxine (SYNTHROID, LEVOTHROID) tablet 50 mcg  50 mcg Oral QAC breakfast Sergi Gellner B, MD   50 mcg at 03/30/18 0747  . linagliptin (TRADJENTA) tablet 5 mg  5 mg Oral Daily Orrie Schubert B, MD   5 mg at 03/30/18 0747  . losartan (COZAAR) tablet 50 mg  50 mg Oral Daily Thatiana Renbarger B, MD   50 mg at 03/30/18 0747  . magnesium hydroxide (MILK OF MAGNESIA) suspension 30 mL  30 mL Oral Daily PRN Atilano Covelli B, MD   30 mL at 03/18/18 0333  . metoprolol tartrate (LOPRESSOR) tablet 25 mg  25 mg Oral BID Fermin Yan B, MD   25 mg at 03/30/18 0747  . polyethylene glycol (MIRALAX / GLYCOLAX) packet 17 g  17 g Oral Daily Leander Tout B, MD   17 g at 03/30/18 0746  . QUEtiapine (SEROQUEL) tablet 400 mg  400 mg Oral QHS Myliyah Rebuck B, MD   400 mg at 03/29/18 2155  . simethicone (MYLICON) chewable tablet 80 mg  80 mg Oral QID Josue Kass B, MD   80 mg at 03/30/18 1205  . simvastatin (ZOCOR) tablet 20 mg  20 mg Oral q1800 , Wardell Honour, MD  20 mg at 03/29/18 1735  . ziprasidone (GEODON) capsule 80 mg  80 mg Oral BID WC Claudette Wermuth B, MD   80 mg at 03/30/18 0747    Lab Results: No results found for this or any previous visit (from the past 48 hour(s)).  Blood Alcohol level:  Lab Results  Component Value Date   ETH <10 03/11/2018   ETH <10 51/70/0174    Metabolic Disorder Labs: Lab Results  Component Value Date   HGBA1C 6.6 (H) 02/18/2018   MPG 122.63 11/14/2017   MPG 114 04/14/2012   Lab Results  Component  Value Date   PROLACTIN 33.6 (H) 12/04/2015   Lab Results  Component Value Date   CHOL 140 11/14/2017   TRIG 152 (H) 11/14/2017   HDL 50 11/14/2017   CHOLHDL 2.8 11/14/2017   VLDL 30 11/14/2017   LDLCALC 60 11/14/2017   LDLCALC 81 09/30/2017    Physical Findings: AIMS:  , ,  ,  ,    CIWA:    COWS:     Musculoskeletal: Strength & Muscle Tone: within normal limits Gait & Station: normal Patient leans: N/A  Psychiatric Specialty Exam: Physical Exam  Nursing note and vitals reviewed. Psychiatric: Her affect is labile. Her speech is rapid and/or pressured. She is hyperactive. Thought content is delusional. Cognition and memory are impaired. She expresses impulsivity.    Review of Systems  Neurological: Negative.   Psychiatric/Behavioral: Positive for hallucinations.  All other systems reviewed and are negative.   Blood pressure 122/68, pulse 65, temperature 98.5 F (36.9 C), temperature source Oral, resp. rate 16, height 5\' 2"  (1.575 m), weight 87.1 kg, SpO2 100 %.Body mass index is 35.12 kg/m.  General Appearance: Casual  Eye Contact:  Good  Speech:  Pressured  Volume:  Increased  Mood:  Euphoric  Affect:  Congruent  Thought Process:  Disorganized, Irrelevant and Descriptions of Associations: Tangential  Orientation:  Full (Time, Place, and Person)  Thought Content:  Delusions  Suicidal Thoughts:  No  Homicidal Thoughts:  No  Memory:  Immediate;   Poor Recent;   Poor Remote;   Poor  Judgement:  Impaired  Insight:  Shallow  Psychomotor Activity:  Increased  Concentration:  Concentration: Poor and Attention Span: Poor  Recall:  Poor  Fund of Knowledge:  Fair  Language:  Fair  Akathisia:  No  Handed:  Right  AIMS (if indicated):     Assets:  Communication Skills Desire for Improvement Financial Resources/Insurance Housing Physical Health Resilience Social Support  ADL's:  Intact  Cognition:  WNL  Sleep:  Number of Hours: 7.45     Treatment Plan  Summary: Daily contact with patient to assess and evaluate symptoms and progress in treatment and Medication management   Ms. Wolters is a 67 year old female with a history of bipolar disorder admitted for a manic episode in the context of medication changes.She had severe insomnia and was given 30 mg of Restoril tonight causinghypotonia and was briefly hospitalized at ICU. She is still manic. She tolerates transition from Depakote to Lamictal well butmanic symptoms are on the rise.  #Bipolar disorder,full blown mania now -continue Invega sustenna injection 234 mg every 28 days, next dose on 9/3 (this is of doubtful benefit) -increase Seroquelto 600mg  nightlyfor mood stabilization and sleep, has not been effective either -completedDepakote taper due to thrombocytopenia -continue increasing Lamictal to 400 mg tonight due to Tegretol -continueTegretol 200 mg BID -continue Geodon to 80 mg BID  #Thrombocytopenia, resolved -Depakotewas discontinued  #  Insomnia, slept 7 hours -Melatonin 10 mg nightly  #Arthritis -continue Voltaren 75 mg BID  #Sleep apnea -CPAP nightly  #Hypothyroidism -Continue Synthroid 50 mcg daily  #HTN, BP elevated -ContinueMetoprolol 12.5 mg twice daily and Cozaar 50 mg daily  #DM -Tradjenta 5 mg daily -ADA diet  #Constipation -bowel regimen -Anusol PRN  #Dislipidemia  -Zocor 20 mg daily  #Labs -Hemoglobin A1c was 6.6 and total cholesterol was 140  #Disposition -discharge to home with the sister -follow up with [facility]  Orson Slick, MD 03/30/2018, 1:38 PM

## 2018-03-29 NOTE — BHH Group Notes (Signed)
[facility] Group Notes:  (Nursing/MHT/Case Management/Adjunct)  Date:  03/29/2018  Time:  9:15 PM  Type of Therapy:  Group Therapy  Participation Level:  Active  Participation Quality:  Sharing  Affect:  Appropriate  Cognitive:  Disorganized  Insight:  Limited  Engagement in Group:  Engaged and Off Topic  Modes of Intervention:  Discussion  Summary of Progress/Problems: Gina [name] would periodically start laughing during group. Gina [name] shared with the group when asked about her goal. Gina [name] responded, "Thank God He saved my life, Amen."  [name] 03/29/2018, 9:15 PM

## 2018-03-29 NOTE — Progress Notes (Addendum)
[facility] Therapy Notes  Date: 03/29/2018  Time: 9:30 am  Location: Craft Room  Behavioral response: Appropriate  Intervention Topic: Problem Solving  Discussion/Intervention:  Group content on today was focused on problem solving. The group described what problem solving is. Patients expressed how problems affect them and how they deal with problems. Individuals identified healthy ways to deal with problems. Patients explained what normally happens to them when they do not deal with problems. The group expressed reoccurring problems for them. The group participated in the intervention "Ways to Solve problems" where patients were given a chance to explore different ways to solve problems.  Clinical Observations/Feedback:  Patient came to group and stated she normally uses prayer to solve her problems. Individual was social with peers and staff while participating in group.  Rosalena Mccorry LRT/CTRS         Maddilyn Campus 03/29/2018 12:43 PM

## 2018-03-30 ENCOUNTER — Inpatient Hospital Stay: Payer: Medicare Other

## 2018-03-30 ENCOUNTER — Ambulatory Visit: Payer: Self-pay

## 2018-03-30 LAB — COMPREHENSIVE METABOLIC PANEL
ALT: 25 U/L (ref 0–44)
AST: 21 U/L (ref 15–41)
Albumin: 4 g/dL (ref 3.5–5.0)
Alkaline Phosphatase: 93 U/L (ref 38–126)
Anion gap: 11 (ref 5–15)
BILIRUBIN TOTAL: 0.5 mg/dL (ref 0.3–1.2)
BUN: 41 mg/dL — AB (ref 8–23)
CALCIUM: 9 mg/dL (ref 8.9–10.3)
CO2: 23 mmol/L (ref 22–32)
CREATININE: 1.74 mg/dL — AB (ref 0.44–1.00)
Chloride: 95 mmol/L — ABNORMAL LOW (ref 98–111)
GFR, EST AFRICAN AMERICAN: 34 mL/min — AB (ref >60)
GFR, EST NON AFRICAN AMERICAN: 29 mL/min — AB (ref >60)
Glucose, Bld: 126 mg/dL — ABNORMAL HIGH (ref 70–99)
Potassium: 5.3 mmol/L — ABNORMAL HIGH (ref 3.5–5.1)
Sodium: 129 mmol/L — ABNORMAL LOW (ref 135–145)
TOTAL PROTEIN: 6.9 g/dL (ref 6.5–8.1)

## 2018-03-30 LAB — CBC WITH DIFFERENTIAL/PLATELET
BASOS ABS: 0 10*3/uL (ref 0–0.1)
BASOS PCT: 0 %
EOS PCT: 2 %
Eosinophils Absolute: 0.1 10*3/uL (ref 0–0.7)
HCT: 30.7 % — ABNORMAL LOW (ref 35.0–47.0)
Hemoglobin: 11.2 g/dL — ABNORMAL LOW (ref 12.0–16.0)
Lymphocytes Relative: 21 %
Lymphs Abs: 1.5 10*3/uL (ref 1.0–3.6)
MCH: 34.4 pg — AB (ref 26.0–34.0)
MCHC: 36.4 g/dL — ABNORMAL HIGH (ref 32.0–36.0)
MCV: 94.5 fL (ref 80.0–100.0)
Monocytes Absolute: 0.6 10*3/uL (ref 0.2–0.9)
Monocytes Relative: 8 %
NEUTROS PCT: 69 %
Neutro Abs: 5 10*3/uL (ref 1.4–6.5)
PLATELETS: 174 10*3/uL (ref 150–440)
RBC: 3.25 MIL/uL — AB (ref 3.80–5.20)
RDW: 14.4 % (ref 11.5–14.5)
WBC: 7.2 10*3/uL (ref 3.6–11.0)

## 2018-03-30 LAB — GLUCOSE, CAPILLARY
GLUCOSE-CAPILLARY: 132 mg/dL — AB (ref 70–99)
GLUCOSE-CAPILLARY: 144 mg/dL — AB (ref 70–99)

## 2018-03-30 MED ORDER — QUETIAPINE FUMARATE 200 MG PO TABS
400.0000 mg | ORAL_TABLET | Freq: Every day | ORAL | Status: DC
  Administered 2018-03-30 – 2018-04-04 (×6): 400 mg via ORAL
  Filled 2018-03-30 (×5): qty 2
  Filled 2018-03-30: qty 4
  Filled 2018-03-30: qty 2

## 2018-03-30 MED ORDER — LAMOTRIGINE 100 MG PO TABS: 300.0000 mg | ORAL_TABLET | Freq: Every day | ORAL | Status: DC

## 2018-03-30 MED ORDER — QUETIAPINE FUMARATE 200 MG PO TABS: 800.0000 mg | ORAL_TABLET | Freq: Every day | ORAL | Status: DC

## 2018-03-30 MED ORDER — METOPROLOL TARTRATE 25 MG PO TABS
12.5000 mg | ORAL_TABLET | Freq: Two times a day (BID) | ORAL | Status: DC
  Administered 2018-03-30 – 2018-03-31 (×3): 12.5 mg via ORAL
  Filled 2018-03-30 (×3): qty 1

## 2018-03-30 NOTE — BHH Group Notes (Signed)
[facility] Group Notes:  (Nursing/MHT/Case Management/Adjunct)  Date:  03/30/2018  Time:  8:52 PM  Type of Therapy:  Group Therapy  Participation Level:  Did Not Attend   Gina [name] 03/30/2018, 8:52 PM

## 2018-03-30 NOTE — Plan of Care (Signed)
Patient verbalizes understanding of the general information that's been provided to her and all questions/concerns have been addressed and answered at this time. Patient denies SI/HI/AVH stating "I don't believe in that suicidal thoughts, I want to go to heaven". Patient also denies any signs/symptoms of depression/anxiety at this time stating "I'm happy, you see how happy I am". Patient continues to show interest in unit activities by attending/participating in unit groups, being present in the milieu, as well as going outside to the courtyard with staff and other members on the unit without any issues at this time. Patient has the ability to verbalize her anger and frustrations in an appropriate manner and continues to demonstrate self-control on the unit. Patient verbalizes understanding and has been in compliance with her prescribed therapeutic/medication regimen. Patient has been free from injury and remains safe on the unit at this time.  Problem: Education: Goal: Knowledge of [facility] Education information/materials will improve Outcome: Progressing Goal: Emotional status will improve Outcome: Progressing Goal: Mental status will improve Outcome: Progressing Goal: Verbalization of understanding the information provided will improve Outcome: Progressing   Problem: Activity: Goal: Interest or engagement in activities will improve Outcome: Progressing Goal: Sleeping patterns will improve Outcome: Progressing   Problem: Coping: Goal: Ability to verbalize frustrations and anger appropriately will improve Outcome: Progressing Goal: Ability to demonstrate self-control will improve Outcome: Progressing   Problem: Health Behavior/Discharge Planning: Goal: Compliance with treatment plan for underlying cause of condition will improve Outcome: Progressing   Problem: Safety: Goal: Periods of time without injury will increase Outcome: Progressing   Problem: Activity: Goal:  Interest or engagement in leisure activities will improve Outcome: Progressing Goal: Imbalance in normal sleep/wake cycle will improve Outcome: Progressing   Problem: Health Behavior/Discharge Planning: Goal: Ability to make decisions will improve Outcome: Progressing Goal: Compliance with therapeutic regimen will improve Outcome: Progressing   Problem: Safety: Goal: Ability to disclose and discuss suicidal ideas will improve Outcome: Progressing Goal: Ability to identify and utilize support systems that promote safety will improve Outcome: Progressing   Problem: Safety: Goal: Ability to remain free from injury will improve Outcome: Progressing

## 2018-03-30 NOTE — Progress Notes (Signed)
D:Ptdenies SI/HI/AVH. Pt is pleasant and cooperative, butstillvery manic utilizing pressured speech and random outbursts. Pt. Denies pain this evening.Pt. Continues to presentveryeuphoric and animatedstill.Pt. Denies anxiety and or depressionand all psychiatric symptoms. Pt. Is much more assertive and needy today with multiple requests, but will forget the requests she has asked for and or change her mind on what she wants often.   A: Q x 15 minute observation checks were completed for safety. Patient was provided with education, needs reinforcement.Patient was given/offered medications per orders. Patient was encourage to attend groups, participate in unit activities and continue with plan of care. Pt. Chart and plans of care reviewed. Pt. Given support and encouragement.  R:Patient is complaint with medication and unit procedures.Pt. Utilizes CPAP with no complications. Pt. Attends snacks and groups appropriately. Pt. Given extensive education on falls risk safety with front wheel walker. Pt. Chronic pain being managed with PRN medications.            Precautionary checks every 15 minutes for safety maintained, room free of safety hazards, patient sustains no injury or falls during this shift. Will endorse care to next shift.

## 2018-03-30 NOTE — BHH Group Notes (Signed)
03/30/2018 1PM  Type of Therapy/Topic:  Group Therapy:  Feelings about Diagnosis  Participation Level:  Did Not Attend   Description of Group:   This group will allow patients to explore their thoughts and feelings about diagnoses they have received. Patients will be guided to explore their level of understanding and acceptance of these diagnoses. Facilitator will encourage patients to process their thoughts and feelings about the reactions of others to their diagnosis and will guide patients in identifying ways to discuss their diagnosis with significant others in their lives. This group will be process-oriented, with patients participating in exploration of their own experiences, giving and receiving support, and processing challenge from other group members.   Therapeutic Goals: 1. Patient will demonstrate understanding of diagnosis as evidenced by identifying two or more symptoms of the disorder 2. Patient will be able to express two feelings regarding the diagnosis 3. Patient will demonstrate their ability to communicate their needs through discussion and/or role play  Summary of Patient Progress: Patient was encouraged and invited to attend group. Patient did not attend group. Social worker will continue to encourage group participation in the future.        Therapeutic Modalities:   Cognitive Behavioral Therapy Brief Therapy Feelings Identification    Darin Engels, LCSW 03/30/2018 2:16 PM

## 2018-03-30 NOTE — Progress Notes (Signed)
Patient ID: Gina [name], female   DOB: [birth year], 67 y.o.   MRN: [id number] Patient fell in the hallway. Hit her head.  1. Head CT scan 2. Lower Metoprolol to 12.5 mg BID. It was increased last night due to slightly elevated BP. 3. Discontinue Lamicta as inefective 4. Gradually discontinue Seroquel as ineffective.

## 2018-03-30 NOTE — Progress Notes (Signed)
D- Patient alert and oriented. Patient presents in a pleasant mood on assessment stating that she slept well last night and had no major complaints other than her chronic left knee pain. Patient did not request any further pain medication outside of her prescribed medication. Patient denies depression and anxiety, however, she does state that "I miss my sister". Patient denies SI, HI, AVH, at this time. Patient's goal for today is "to get well".  A- Scheduled medications administered to patient, per MD orders. Support and encouragement provided.  Routine safety checks conducted every 15 minutes.  Patient informed to notify staff with problems or concerns.  R- No adverse drug reactions noted. Patient contracts for safety at this time. Patient compliant with medications and treatment plan. Patient receptive, calm, and cooperative. Patient interacts well with others on the unit.  Patient remains safe at this time.

## 2018-03-30 NOTE — Progress Notes (Signed)
[facility] MD Progress Note  03/31/2018 12:45 PM Berenice Oehlert  MRN:  [id number]  Subjective:   Ms. Quant is slightly better. Less talkative and more focused. She fell yesterday. Head Ct is negative but complains of headache and back pain. Somatically preoccupied with long stories about constipation.   We discontinue Losartan due to elevated potassium and KFT.  Principal Problem: Bipolar I disorder, current or most recent episode manic, with psychotic features ([place]) Diagnosis:   Patient Active Problem List   Diagnosis Date Noted  . Bipolar I disorder, current or most recent episode manic, with psychotic features ([place]) [F31.2] 11/04/2017    Priority: High  . Altered mental status, unspecified [R41.82] 03/16/2018  . Hypotension [I95.9] 03/15/2018  . Rash of hands [R21] 02/18/2018  . Pain of right heel [M79.671] 12/30/2017  . Encounter for chronic pain management [G89.29] 12/18/2017  . Foot pain, bilateral [id number], M79.672] 11/25/2017  . Gallbladder polyp [K82.4] 10/21/2017  . Left shoulder pain [M25.512] 10/21/2017  . Benign neoplasm of ascending colon [D12.2]   . Low back pain radiating to left lower extremity [M54.5, M79.605] 02/26/2017  . Acquired hypothyroidism [E03.9] 09/10/2016  . Thrombocytopenia ([place]) [D69.6] 06/15/2016  . Medicare annual wellness visit, subsequent [Z00.00] 06/04/2016  . Advanced care planning/counseling discussion [Z71.89] 06/04/2016  . External hemorrhoid [K64.4] 04/02/2016  . Slow transit constipation [K59.01] 01/04/2016  . Bipolar I disorder, most recent episode (or current) manic ([place]) [F31.10] 12/03/2015  . NAFLD (nonalcoholic fatty liver disease) [K76.0] 03/28/2015  . Gallbladder calculus without cholecystitis [K80.20] 03/28/2015  . Gout [M10.9] 03/27/2015  . HCV antibody positive [R76.8] 03/27/2015  . History of bladder cancer [Z85.51] 03/27/2015  . History of bundle branch block [Z86.79] 03/27/2015  . Genetic testing [Z13.79] 02/28/2015   . Loss of memory [R41.3] 09/04/2014  . Controlled type 2 diabetes mellitus with diabetic nephropathy ([place]) [E11.21] 06/07/2014  . Hyperlipidemia [E78.5]   . Anemia in CKD (chronic kidney disease) [N18.9, D63.1] 01/26/2013  . Personal history of colonic adenomas and colon cancer [Z86.010] 11/11/2012  . Osteoarthritis of knee [M17.10] 10/02/2012  . OSA on CPAP [G47.33, Z99.89]   . CKD (chronic kidney disease) stage 4, GFR 15-29 ml/min (HCC) [N18.4]   . Severe obesity (BMI 35.0-39.9) with comorbidity ([place]) [E66.01]   . HTN (hypertension), benign [I10] 04/07/2012  . Hx of colon cancer, stage I [Z85.038] 08/05/2011   Total Time spent with patient: 20 minutes  Past Psychiatric History: bipolar disorder  Past Medical History:  Past Medical History:  Diagnosis Date  . Anemia in chronic kidney disease   . Bipolar depression [facility])    sees psychiatrist - psych admission 03/2015  . Bladder cancer ([place]) 1990's  . Blood transfusion without reported diagnosis 2009  . Cataract    left  . CKD (chronic kidney disease) stage 3, GFR 30-59 ml/min (HCC)   . Colon cancer ([place]) 1990's  . DJD (degenerative joint disease), lumbar    chronic lower back pain  . Enterocutaneous fistula 04/07/2012   completed PT 06/2012 ([facility])  . Family history of breast cancer   . Family history of colon cancer   . Family history of stomach cancer   . GERD (gastroesophageal reflux disease)   . History of bladder cancer 1997  . History of colon cancer    s/p surgery  . History of uterine cancer    s/p hysterectomy  . Hyperlipidemia   . Hypertension   . IDA (iron deficiency anemia) 01/2013   thought due to h/o polyps  .  Obesity (BMI 30-39.9)   . OSA on CPAP    6cm H2O  . Personal history of colonic adenomas and colon cancer 11/11/2012  . Positive hepatitis C antibody test 09/2014   but negative confirmatory testing  . RBBB   . Uncontrolled type 2 diabetes mellitus with nephropathy ([place]) 06/07/2014   completed  DSME 02/2016    Past Surgical History:  Procedure Laterality Date  . BREAST BIOPSY Right 01/2014   fibroadenoma  . COLONOSCOPY  07/2009  . COLONOSCOPY  11/2012   2 tubular adenomas, mild diverticulosis, pending genetic testing for Lynch syndrome [name]) rpt 2 yrs  . COLONOSCOPY  02/2015   TA, diverticulosis, rpt 2 yrs [name])  . COLONOSCOPY WITH PROPOFOL N/A 06/09/2017   TAx1, rpt 2 yrs [name], [name], MD)  . DEXA  12/2009   WNL  . DOBUTAMINE STRESS ECHO  12/2009   no evidence of ischemia  . HERNIA REPAIR  02/04/12  . i & d abdominal wound  02/19/12  . LEFT OOPHORECTOMY  2005  . PARTIAL COLECTOMY  about 2008   for colon cancer  . PARTIAL HYSTERECTOMY  1981   uterine cancer, R ovary remains  . Reexploration of abdominal wound and Allograft placemet  02/26/12  . Removal of infected mesh and abdominal wound vac placement  02/24/12  . sleep study  02/2014   OSA - AHI 55, nadir 81% [name])   Family History:  Family History  Problem Relation Age of Onset  . Colon cancer Mother 23  . Stomach cancer Mother        dx in her 62s?  . CAD Father        MI; deceased 87  . Mental illness Sister        anxiety/depression  . Thyroid disease Sister   . Breast cancer Maternal Grandmother        age at dx unknown  . Diabetes Brother   . Diabetes Brother   . Diabetes Other        aunts and uncles both sides  . Arthritis Other        strong fmhx  . Colon cancer Other 74       maternal half-sister; deceased   Family Psychiatric  History: none Social History:  Social History   Substance and Sexual Activity  Alcohol Use No  . Alcohol/week: 0.0 standard drinks     Social History   Substance and Sexual Activity  Drug Use No    Social History   Socioeconomic History  . Marital status: Single    Spouse name: Not on file  . Number of children: 0  . Years of education: Not on file  . Highest education level: Not on file  Occupational History  . Not on file  Social Needs  .  Financial resource strain: Not on file  . Food insecurity:    Worry: Not on file    Inability: Not on file  . Transportation needs:    Medical: Not on file    Non-medical: Not on file  Tobacco Use  . Smoking status: Never Smoker  . Smokeless tobacco: Never Used  Substance and Sexual Activity  . Alcohol use: No    Alcohol/week: 0.0 standard drinks  . Drug use: No  . Sexual activity: Never  Lifestyle  . Physical activity:    Days per week: Not on file    Minutes per session: Not on file  . Stress: Not on file  Relationships  .  Social connections:    Talks on phone: Not on file    Gets together: Not on file    Attends religious service: Not on file    Active member of club or organization: Not on file    Attends meetings of clubs or organizations: Not on file    Relationship status: Not on file  Other Topics Concern  . Not on file  Social History Narrative   Lives with sister, no pets   Occupation: disabled, for bipolar and arthritis   Edu: GED   Activity: take walks   Diet: good water, vegetables daily   Religion: [place], Dr. Jimmye Norman (ph [phone])   Additional Social History:                         Sleep: Poor  Appetite:  Fair  Current Medications: Current Facility-Administered Medications  Medication Dose Route Frequency Provider Last Rate Last Dose  . acetaminophen (TYLENOL) tablet 650 mg  650 mg Oral Q6H PRN Koree Schopf B, MD   650 mg at 03/31/18 0336  . alum & mag hydroxide-simeth (MAALOX/MYLANTA) 200-200-20 MG/5ML suspension 30 mL  30 mL Oral Q4H PRN Sarabelle Genson B, MD   30 mL at 03/20/18 1009  . amantadine (SYMMETREL) capsule 100 mg  100 mg Oral BID Orion Mole B, MD   100 mg at 03/31/18 0857  . bisacodyl (DULCOLAX) EC tablet 5 mg  5 mg Oral Daily PRN Alleta Avery B, MD   5 mg at 03/18/18 0333  . carbamazepine (TEGRETOL) tablet 200 mg  200 mg Oral BID AC & HS Wei Newbrough,  Unique Searfoss B, MD   200 mg at 03/31/18 0858  . diclofenac (VOLTAREN) EC tablet 75 mg  75 mg Oral BID Scarlet Abad B, MD   75 mg at 03/31/18 0857  . docusate sodium (COLACE) capsule 100 mg  100 mg Oral BID Kenidy Crossland B, MD   100 mg at 03/31/18 0857  . hydrocortisone (ANUSOL-HC) 2.5 % rectal cream   Rectal QID Verbie Babic B, MD      . levothyroxine (SYNTHROID, LEVOTHROID) tablet 50 mcg  50 mcg Oral QAC breakfast Eliseo Withers B, MD   50 mcg at 03/31/18 0625  . linagliptin (TRADJENTA) tablet 5 mg  5 mg Oral Daily Aelyn Stanaland B, MD   5 mg at 03/31/18 0857  . losartan (COZAAR) tablet 50 mg  50 mg Oral Daily Leoda Smithhart B, MD   50 mg at 03/30/18 0747  . magnesium hydroxide (MILK OF MAGNESIA) suspension 30 mL  30 mL Oral Daily PRN Kenji Mapel B, MD   30 mL at 03/18/18 0333  . metoprolol tartrate (LOPRESSOR) tablet 12.5 mg  12.5 mg Oral BID Gillian Kluever B, MD   12.5 mg at 03/31/18 0904  . polyethylene glycol (MIRALAX / GLYCOLAX) packet 17 g  17 g Oral Daily Kosta Schnitzler B, MD   17 g at 03/31/18 0856  . QUEtiapine (SEROQUEL) tablet 400 mg  400 mg Oral QHS Oreste Majeed B, MD   400 mg at 03/30/18 2126  . simethicone (MYLICON) chewable tablet 80 mg  80 mg Oral QID Satine Hausner B, MD   80 mg at 03/31/18 1200  . simvastatin (ZOCOR) tablet 20 mg  20 mg Oral q1800 Zivah Mayr B, MD   20 mg at 03/30/18 1810  . ziprasidone (GEODON) capsule 80 mg  80 mg Oral  BID WC Bevelyn Arriola B, MD   80 mg at 03/31/18 0857    Lab Results:  Results for orders placed or performed during the hospital encounter of 03/16/18 (from the past 48 hour(s))  Glucose, capillary     Status: Abnormal   Collection Time: 03/30/18  3:13 PM  Result Value Ref Range   Glucose-Capillary 132 (H) 70 - 99 mg/dL   Comment 1 Notify RN   Comprehensive metabolic panel     Status: Abnormal   Collection Time: 03/30/18  3:23 PM  Result Value Ref Range   Sodium 129  (L) 135 - 145 mmol/L   Potassium 5.3 (H) 3.5 - 5.1 mmol/L   Chloride 95 (L) 98 - 111 mmol/L   CO2 23 22 - 32 mmol/L   Glucose, Bld 126 (H) 70 - 99 mg/dL   BUN 41 (H) 8 - 23 mg/dL   Creatinine, Ser 1.74 (H) 0.44 - 1.00 mg/dL   Calcium 9.0 8.9 - 10.3 mg/dL   Total Protein 6.9 6.5 - 8.1 g/dL   Albumin 4.0 3.5 - 5.0 g/dL   AST 21 15 - 41 U/L   ALT 25 0 - 44 U/L   Alkaline Phosphatase 93 38 - 126 U/L   Total Bilirubin 0.5 0.3 - 1.2 mg/dL   GFR calc non Af Amer 29 (L) >60 mL/min   GFR calc Af Amer 34 (L) >60 mL/min    Comment: (NOTE) The eGFR has been calculated using the CKD EPI equation. This calculation has not been validated in all clinical situations. eGFR's persistently <60 mL/min signify possible Chronic Kidney Disease.    Anion gap 11 5 - 15    Comment: Performed at [facility], [address] [place], [place] [address]  CBC with Differential/Platelet     Status: Abnormal   Collection Time: 03/30/18  3:23 PM  Result Value Ref Range   WBC 7.2 3.6 - 11.0 K/uL   RBC 3.25 (L) 3.80 - 5.20 MIL/uL   Hemoglobin 11.2 (L) 12.0 - 16.0 g/dL   HCT 30.7 (L) 35.0 - 47.0 %   MCV 94.5 80.0 - 100.0 fL   MCH 34.4 (H) 26.0 - 34.0 pg   MCHC 36.4 (H) 32.0 - 36.0 g/dL   RDW 14.4 11.5 - 14.5 %   Platelets 174 150 - 440 K/uL   Neutrophils Relative % 69 %   Neutro Abs 5.0 1.4 - 6.5 K/uL   Lymphocytes Relative 21 %   Lymphs Abs 1.5 1.0 - 3.6 K/uL   Monocytes Relative 8 %   Monocytes Absolute 0.6 0.2 - 0.9 K/uL   Eosinophils Relative 2 %   Eosinophils Absolute 0.1 0 - 0.7 K/uL   Basophils Relative 0 %   Basophils Absolute 0.0 0 - 0.1 K/uL    Comment: Performed at [facility], [address] [place], [place] [address]  Glucose, capillary     Status: Abnormal   Collection Time: 03/30/18  6:20 PM  Result Value Ref Range   Glucose-Capillary 144 (H) 70 - 99 mg/dL  Basic metabolic panel     Status: Abnormal   Collection Time: 03/31/18  6:33 AM  Result Value Ref Range    Sodium 130 (L) 135 - 145 mmol/L   Potassium 5.3 (H) 3.5 - 5.1 mmol/L   Chloride 96 (L) 98 - 111 mmol/L   CO2 24 22 - 32 mmol/L   Glucose, Bld 102 (H) 70 - 99 mg/dL   BUN 41 (H) 8 -  23 mg/dL   Creatinine, Ser 1.94 (H) 0.44 - 1.00 mg/dL   Calcium 9.2 8.9 - 10.3 mg/dL   GFR calc non Af Amer 26 (L) >60 mL/min   GFR calc Af Amer 30 (L) >60 mL/min    Comment: (NOTE) The eGFR has been calculated using the CKD EPI equation. This calculation has not been validated in all clinical situations. eGFR's persistently <60 mL/min signify possible Chronic Kidney Disease.    Anion gap 10 5 - 15    Comment: Performed at [facility], [address] [place], [place] [address]    Blood Alcohol level:  Lab Results  Component Value Date   [facility] <10 03/11/2018   ETH <10 98/33/8250    Metabolic Disorder Labs: Lab Results  Component Value Date   HGBA1C 6.6 (H) 02/18/2018   MPG 122.63 11/14/2017   MPG 114 04/14/2012   Lab Results  Component Value Date   PROLACTIN 33.6 (H) 12/04/2015   Lab Results  Component Value Date   CHOL 140 11/14/2017   TRIG 152 (H) 11/14/2017   HDL 50 11/14/2017   CHOLHDL 2.8 11/14/2017   VLDL 30 11/14/2017   LDLCALC 60 11/14/2017   LDLCALC 81 09/30/2017    Physical Findings: AIMS:  , ,  ,  ,    CIWA:    COWS:     Musculoskeletal: Strength & Muscle Tone: within normal limits Gait & Station: normal Patient leans: N/A  Psychiatric Specialty Exam: Physical Exam  Nursing note and vitals reviewed. Psychiatric: Her affect is labile and inappropriate. Her speech is rapid and/or pressured and tangential. She is hyperactive. Thought content is paranoid and delusional. Cognition and memory are impaired. She expresses impulsivity.    Review of Systems  Musculoskeletal: Positive for falls.  Neurological: Negative.   Psychiatric/Behavioral: Positive for memory loss. The patient has insomnia.   All other systems reviewed and are negative.   Blood pressure  (!) 144/68, pulse 66, temperature 98.1 F (36.7 C), resp. rate 20, height _0  (1.575 m), weight 87.1 kg, SpO2 99 %.Body mass index is 35.12 kg/m.  General Appearance: Casual  Eye Contact:  Good  Speech:  Pressured  Volume:  Increased  Mood:  Euphoric  Affect:  Congruent, Inappropriate and Labile  Thought Process:  Disorganized, Irrelevant and Descriptions of Associations: Tangential  Orientation:  Full (Time, Place, and Person)  Thought Content:  Delusions  Suicidal Thoughts:  No  Homicidal Thoughts:  No  Memory:  Immediate;   Poor Recent;   Poor Remote;   Poor  Judgement:  Poor  Insight:  Lacking  Psychomotor Activity:  Increased  Concentration:  Concentration: Poor and Attention Span: Poor  Recall:  Poor  Fund of Knowledge:  Fair  Language:  Fair  Akathisia:  No  Handed:  Right  AIMS (if indicated):     Assets:  Communication Skills Desire for Improvement Financial Resources/Insurance Housing Physical Health Resilience Social Support  ADL's:  Intact  Cognition:  WNL  Sleep:  Number of Hours: 7.5     Treatment Plan Summary: Daily contact with patient to assess and evaluate symptoms and progress in treatment and Medication management   Ms. Critz is a 67 year old female with a history of bipolar disorder admitted for a manic episode in the context of medication changes.She had severe insomnia and was given 30 mg of Restoril tonight causinghypotonia and was briefly hospitalized at ICU. She is still manic. She tolerates transition from Depakote to Lamictal well butmanic symptoms are  on the rise.  #Bipolar disorder,full blown mania now -continue Invega sustenna injection 234 mg every 28 days, next dose on 9/3(this is of doubtful benefit) -increase Seroquelto 686m nightlyfor mood stabilization and sleep, has not been effective either -completedDepakote taper due to thrombocytopenia -continueincreasingLamictal to400 mg tonightdue to  Tegretol -continueTegretol 200 mg BID -continueGeodon to 80 mg BID  #Thrombocytopenia, resolved -Depakotewas discontinued  #Insomnia,slept 7 hours -Melatonin 10 mg nightly  #Arthritis -continueVoltaren 75 mg BID  #Sleep apnea -CPAP nightly  #Hypothyroidism -Continue Synthroid 50 mcg daily  #HTN, BP elevated -ContinueMetoprolol 12.5 mg twice daily -discontinue Losartan  #DM -Tradjenta 5 mg daily -ADA diet  #Constipation -bowel regimen -Anusol PRN  #Dislipidemia  -Zocor 20 mg daily  #Labs -Hemoglobin A1c was 6.6 and total cholesterol was 140 -head CT negative  #Disposition -discharge to home with the sister -follow up with [facility]  JOrson Slick MD 03/31/2018, 12:45 PM

## 2018-03-30 NOTE — Plan of Care (Addendum)
Patient found awake in bed upon my arrival. Patient is neither visible nor social this evening. Patient reports having fallen earlier in the day. Reports bump to back of head and lingering back pain. Requests and is given ice pack for back of head. Bump much improved after 20 minutes. Patient reports mood is "okay" affect is appropriate. Denies SI/HI/AVH. Speech remains pressured and loud but patient is polite and pleasant. Reports eating and voiding adequately. Refused snack but requests and is given diet ginger ale which she enjoyed. Utilizing both walker and wheelchair for ambulation. Gait appears steady while using wheelchair to ambulate. Bed moved closer to call bell to avoid further injury. C-PAP provided. Compliant with HS medications and staff direction. Did not attend group as she did not "want to fall again." Q 15 minute checks maintained. Will continue to monitor throughout the shift. @2300 , pharmacy contacted RN to report that K+is 5.3 and serum creatinine is 1.74. Recommends new BMP in am prior to releasing am Losartan dose. Dr. Weber Cooks contacted. BMP order placed.  @0335 , patient awake complaining of back pain. Given Tylenol. Will monitor for efficacy. Patient fell back to sleep.  Patient slept 7.5 hours. Restlessly. Compliant with VS. BP elevated. Dr. Weber Cooks notified. Instructed to give HTN medication as scheduled. Compliant with lab draw. Will endorse care to oncoming shift.  Problem: Education: Goal: Knowledge of [facility] Education information/materials will improve Outcome: Progressing Goal: Emotional status will improve Outcome: Progressing Goal: Mental status will improve Outcome: Progressing Goal: Verbalization of understanding the information provided will improve Outcome: Progressing   Problem: Activity: Goal: Sleeping patterns will improve Outcome: Progressing   Problem: Coping: Goal: Ability to verbalize frustrations and anger appropriately will  improve Outcome: Progressing Goal: Ability to demonstrate self-control will improve Outcome: Progressing   Problem: Health Behavior/Discharge Planning: Goal: Compliance with treatment plan for underlying cause of condition will improve Outcome: Progressing   Problem: Activity: Goal: Interest or engagement in activities will improve Outcome: Not Progressing   Problem: Safety: Goal: Periods of time without injury will increase Outcome: Not Progressing

## 2018-03-30 NOTE — Progress Notes (Signed)
Recreation Therapy Notes   Date: 03/30/2018  Time: 9:30 am  Location: Craft Room  Behavioral response: Appropriate   Intervention Topic: Communication  Discussion/Intervention:  Group content today was focused on communication. The group defined communication and ways to communicate with others. Individuals stated reason why communication is important and some reasons to communicate with others. Patients expressed if they thought they were good at communicating with others and ways they could improve their communication skills. The group identified important parts of communication and some experiences they have had in the past with communication. The group participated in the intervention "What is that?", where they had a chance to test out their communication skills and identify ways to improve their communication techniques.  .  Clinical Observations/Feedback:  Patient attended group and stated listening is the most important part of communication. She was social with peers and staff while participating in group.  Fardowsa Authier LRT/CTRS         Domonick Sittner 03/30/2018 11:57 AM

## 2018-03-30 NOTE — Plan of Care (Signed)
Patient verbalizes understanding of the general information that's been provided to her and all questions/concerns have been addressed and answered at this time. Patient denies SI/HI/AVH. Patient also denies any signs/symptoms of depression/anxiety at this time, however, she does state to this Probation officer "I miss my sister". Patient continues to show interest in unit activities by attending/participating in unit groups as well as being present in the milieu without any issues at this time. Patient has the ability to verbalize her anger and frustrations in an appropriate manner andcontinues todemonstrateself-control on the unit. Patient verbalizes understanding and has been in compliance with her prescribed therapeutic/medication regimen. Patient has been free from injury and remains safe on the unit at this time.  Problem: Education: Goal: Knowledge of [facility] Education information/materials will improve 03/30/2018 1901 by Lyda Kalata, RN Outcome: Progressing 03/30/2018 1152 by Lyda Kalata, RN Outcome: Progressing Goal: Emotional status will improve 03/30/2018 1901 by Lyda Kalata, RN Outcome: Progressing 03/30/2018 1152 by Lyda Kalata, RN Outcome: Progressing Goal: Mental status will improve 03/30/2018 1901 by Lyda Kalata, RN Outcome: Progressing 03/30/2018 1152 by Lyda Kalata, RN Outcome: Progressing Goal: Verbalization of understanding the information provided will improve 03/30/2018 1901 by Lyda Kalata, RN Outcome: Progressing 03/30/2018 1152 by Lyda Kalata, RN Outcome: Progressing   Problem: Activity: Goal: Interest or engagement in activities will improve 03/30/2018 1901 by Lyda Kalata, RN Outcome: Progressing 03/30/2018 1152 by Lyda Kalata, RN Outcome: Progressing Goal: Sleeping patterns will improve 03/30/2018 1901 by Lyda Kalata, RN Outcome: Progressing 03/30/2018 1152 by Lyda Kalata,  RN Outcome: Progressing   Problem: Coping: Goal: Ability to verbalize frustrations and anger appropriately will improve 03/30/2018 1901 by Lyda Kalata, RN Outcome: Progressing 03/30/2018 1152 by Lyda Kalata, RN Outcome: Progressing Goal: Ability to demonstrate self-control will improve 03/30/2018 1901 by Lyda Kalata, RN Outcome: Progressing 03/30/2018 1152 by Lyda Kalata, RN Outcome: Progressing   Problem: Health Behavior/Discharge Planning: Goal: Compliance with treatment plan for underlying cause of condition will improve 03/30/2018 1901 by Lyda Kalata, RN Outcome: Progressing 03/30/2018 1152 by Lyda Kalata, RN Outcome: Progressing   Problem: Safety: Goal: Periods of time without injury will increase 03/30/2018 1901 by Lyda Kalata, RN Outcome: Progressing 03/30/2018 1152 by Lyda Kalata, RN Outcome: Progressing   Problem: Activity: Goal: Interest or engagement in leisure activities will improve 03/30/2018 1901 by Lyda Kalata, RN Outcome: Progressing 03/30/2018 1152 by Lyda Kalata, RN Outcome: Progressing Goal: Imbalance in normal sleep/wake cycle will improve 03/30/2018 1901 by Lyda Kalata, RN Outcome: Progressing 03/30/2018 1152 by Lyda Kalata, RN Outcome: Progressing   Problem: Health Behavior/Discharge Planning: Goal: Ability to make decisions will improve 03/30/2018 1901 by Lyda Kalata, RN Outcome: Progressing 03/30/2018 1152 by Lyda Kalata, RN Outcome: Progressing Goal: Compliance with therapeutic regimen will improve 03/30/2018 1901 by Lyda Kalata, RN Outcome: Progressing 03/30/2018 1152 by Lyda Kalata, RN Outcome: Progressing   Problem: Safety: Goal: Ability to disclose and discuss suicidal ideas will improve 03/30/2018 1901 by Lyda Kalata, RN Outcome: Progressing 03/30/2018 1152 by Lyda Kalata, RN Outcome: Progressing Goal: Ability to identify  and utilize support systems that promote safety will improve 03/30/2018 1901 by Lyda Kalata, RN Outcome: Progressing 03/30/2018 1152 by Lyda Kalata, RN Outcome: Progressing   Problem: Safety: Goal: Ability to remain free from injury will improve 03/30/2018 1901 by Lyda Kalata, RN Outcome: Progressing 03/30/2018 1152 by Lyda Kalata, RN Outcome: Progressing

## 2018-03-30 NOTE — Plan of Care (Signed)
Pt. Complaint with medications. Pt. Denies SI/Hi and verbally is able to contract for safety. Pt. Reports she can remain safe while on the unit. Pt. Needs reinforcement of provided education. Pt. Continues to present animated, euphoric, and pressured this evening.     Problem: Education: Goal: Knowledge of [facility] Education information/materials will improve Outcome: Not Progressing Goal: Emotional status will improve Outcome: Not Progressing Goal: Mental status will improve Outcome: Not Progressing   Problem: Health Behavior/Discharge Planning: Goal: Compliance with treatment plan for underlying cause of condition will improve Outcome: Progressing   Problem: Safety: Goal: Periods of time without injury will increase Outcome: Progressing   Problem: Safety: Goal: Ability to disclose and discuss suicidal ideas will improve Outcome: Progressing   Problem: Safety: Goal: Ability to remain free from injury will improve Outcome: Progressing

## 2018-03-30 NOTE — Progress Notes (Signed)
Patient had a fall in the hallway outside of the nurse's station. Patient had been walker well with her front-wheel walker without any issues today. Patient stated to this writer "I don't know what happened it just came out of nowhere". Patient states that she hit her head "I think I have a bump on my head, do you see it", and this writer examined her head, there was a small knot and some redness to her scalp. MD was notified. Vitals and CBG monitoring was obtained. Patient is in room, sitting up in bed, and is safe on the unit. Patient rates her head pain a "10/10", but does not request any pain medication at this time.

## 2018-03-31 LAB — BASIC METABOLIC PANEL
ANION GAP: 10 (ref 5–15)
BUN: 41 mg/dL — ABNORMAL HIGH (ref 8–23)
CHLORIDE: 96 mmol/L — AB (ref 98–111)
CO2: 24 mmol/L (ref 22–32)
Calcium: 9.2 mg/dL (ref 8.9–10.3)
Creatinine, Ser: 1.94 mg/dL — ABNORMAL HIGH (ref 0.44–1.00)
GFR calc non Af Amer: 26 mL/min — ABNORMAL LOW (ref >60)
GFR, EST AFRICAN AMERICAN: 30 mL/min — AB (ref >60)
Glucose, Bld: 102 mg/dL — ABNORMAL HIGH (ref 70–99)
Potassium: 5.3 mmol/L — ABNORMAL HIGH (ref 3.5–5.1)
SODIUM: 130 mmol/L — AB (ref 135–145)

## 2018-03-31 MED ORDER — METOPROLOL TARTRATE 25 MG PO TABS
25.0000 mg | ORAL_TABLET | Freq: Two times a day (BID) | ORAL | Status: DC
  Administered 2018-04-01 – 2018-04-05 (×9): 25 mg via ORAL
  Filled 2018-03-31 (×9): qty 1

## 2018-03-31 MED ORDER — CLONIDINE HCL 0.1 MG PO TABS
0.1000 mg | ORAL_TABLET | Freq: Once | ORAL | Status: AC
  Administered 2018-03-31: 0.1 mg via ORAL
  Filled 2018-03-31: qty 1

## 2018-03-31 NOTE — Progress Notes (Addendum)
Gina [name] - [name] Progress Note  04/01/2018 1:44 PM Gina [name]  MRN:  [id number]  Subjective:    Gina [name] is still manic, disorganized, intrusive, and somatic. She accepts medications and reports no side effects but improves very slowly if at all. Depakote was discontinue due to low platelets but current regime is no substitution. Blood pressure waas elevated last night after Losartan discontinued.  Spoke extensively with the sister who voices multiple concerns. 1. Bed too high causing Gina [name] hernia to burst. Bed was elevated but due to safety issues we are unable to fully accommodate the request. 2. Needs nail clipping in patient with diabetes. Unfortunately, podiatry recommends that this is done in outpatient setting. 3. Recent fall that she blames on faulty walker and slippery socks. We checked the walker and it is ok. Awaiting PT consult.  Principal Problem: Bipolar I disorder, current or most recent episode manic, with psychotic features ([place]) Diagnosis:   Patient Active Problem List   Diagnosis Date Noted  . Bipolar I disorder, current or most recent episode manic, with psychotic features ([place]) [F31.2] 11/04/2017    Priority: High  . Altered mental status, unspecified [R41.82] 03/16/2018  . Hypotension [I95.9] 03/15/2018  . Rash of hands [R21] 02/18/2018  . Pain of right heel [M79.671] 12/30/2017  . Encounter for chronic pain management [G89.29] 12/18/2017  . Foot pain, bilateral [id number], M79.672] 11/25/2017  . Gallbladder polyp [K82.4] 10/21/2017  . Left shoulder pain [M25.512] 10/21/2017  . Benign neoplasm of ascending colon [D12.2]   . Low back pain radiating to left lower extremity [M54.5, M79.605] 02/26/2017  . Acquired hypothyroidism [E03.9] 09/10/2016  . Thrombocytopenia ([place]) [D69.6] 06/15/2016  . Medicare annual wellness visit, subsequent [Z00.00] 06/04/2016  . Advanced care planning/counseling discussion [Z71.89] 06/04/2016  . External hemorrhoid [K64.4]  04/02/2016  . Slow transit constipation [K59.01] 01/04/2016  . Bipolar I disorder, most recent episode (or current) manic ([place]) [F31.10] 12/03/2015  . NAFLD (nonalcoholic fatty liver disease) [K76.0] 03/28/2015  . Gallbladder calculus without cholecystitis [K80.20] 03/28/2015  . Gout [M10.9] 03/27/2015  . HCV antibody positive [R76.8] 03/27/2015  . History of bladder cancer [Z85.51] 03/27/2015  . History of bundle branch block [Z86.79] 03/27/2015  . Genetic testing [Z13.79] 02/28/2015  . Loss of memory [R41.3] 09/04/2014  . Controlled type 2 diabetes mellitus with diabetic nephropathy ([place]) [E11.21] 06/07/2014  . Hyperlipidemia [E78.5]   . Anemia in CKD (chronic kidney disease) [N18.9, D63.1] 01/26/2013  . Personal history of colonic adenomas and colon cancer [Z86.010] 11/11/2012  . Osteoarthritis of knee [M17.10] 10/02/2012  . OSA on CPAP [G47.33, Z99.89]   . CKD (chronic kidney disease) stage 4, GFR 15-29 ml/min (HCC) [N18.4]   . Severe obesity (BMI 35.0-39.9) with comorbidity ([place]) [E66.01]   . HTN (hypertension), benign [I10] 04/07/2012  . Hx of colon cancer, stage I [Z85.038] 08/05/2011   Total Time spent with patient: 20 minutes  Past Psychiatric History: bipolar disorder  Past Medical History:  Past Medical History:  Diagnosis Date  . Anemia in chronic kidney disease   . Bipolar depression [facility])    sees psychiatrist - psych admission 03/2015  . Bladder cancer ([place]) 1990's  . Blood transfusion without reported diagnosis 2009  . Cataract    left  . CKD (chronic kidney disease) stage 3, GFR 30-59 ml/min (HCC)   . Colon cancer ([place]) 1990's  . DJD (degenerative joint disease), lumbar    chronic lower back pain  . Enterocutaneous fistula 04/07/2012   completed PT 06/2012 ([facility])  .  Family history of breast cancer   . Family history of colon cancer   . Family history of stomach cancer   . GERD (gastroesophageal reflux disease)   . History of bladder cancer 1997  .  History of colon cancer    s/p surgery  . History of uterine cancer    s/p hysterectomy  . Hyperlipidemia   . Hypertension   . IDA (iron deficiency anemia) 01/2013   thought due to h/o polyps  . Obesity (BMI 30-39.9)   . OSA on CPAP    6cm H2O  . Personal history of colonic adenomas and colon cancer 11/11/2012  . Positive hepatitis C antibody test 09/2014   but negative confirmatory testing  . RBBB   . Uncontrolled type 2 diabetes mellitus with nephropathy ([place]) 06/07/2014   completed DSME 02/2016    Past Surgical History:  Procedure Laterality Date  . BREAST BIOPSY Right 01/2014   fibroadenoma  . COLONOSCOPY  07/2009  . COLONOSCOPY  11/2012   2 tubular adenomas, mild diverticulosis, pending genetic testing for Lynch syndrome Gina [name]) rpt 2 yrs  . COLONOSCOPY  02/2015   TA, diverticulosis, rpt 2 yrs Gina [name])  . COLONOSCOPY WITH PROPOFOL N/A 06/09/2017   TAx1, rpt 2 yrs Gina [name], Gina [name])  . DEXA  12/2009   WNL  . DOBUTAMINE STRESS ECHO  12/2009   no evidence of ischemia  . HERNIA REPAIR  02/04/12  . i & d abdominal wound  02/19/12  . LEFT OOPHORECTOMY  2005  . PARTIAL COLECTOMY  about 2008   for colon cancer  . PARTIAL HYSTERECTOMY  1981   uterine cancer, R ovary remains  . Reexploration of abdominal wound and Allograft placemet  02/26/12  . Removal of infected mesh and abdominal wound vac placement  02/24/12  . sleep study  02/2014   OSA - AHI 55, nadir 81% Gina [name])   Family History:  Family History  Problem Relation Age of Onset  . Colon cancer Mother 60  . Stomach cancer Mother        dx in her 47s?  . CAD Father        MI; deceased 37  . Mental illness Sister        anxiety/depression  . Thyroid disease Sister   . Breast cancer Maternal Grandmother        age at dx unknown  . Diabetes Brother   . Diabetes Brother   . Diabetes Other        aunts and uncles both sides  . Arthritis Other        strong fmhx  . Colon cancer Other 63       maternal half-sister;  deceased   Family Psychiatric  History: none Social History:  Social History   Substance and Sexual Activity  Alcohol Use No  . Alcohol/week: 0.0 standard drinks     Social History   Substance and Sexual Activity  Drug Use No    Social History   Socioeconomic History  . Marital status: Single    Spouse name: Not on file  . Number of children: 0  . Years of education: Not on file  . Highest education level: Not on file  Occupational History  . Not on file  Social Needs  . Financial resource strain: Not on file  . Food insecurity:    Worry: Not on file    Inability: Not on file  . Transportation needs:    Medical: Not on file  Non-medical: Not on file  Tobacco Use  . Smoking status: Never Smoker  . Smokeless tobacco: Never Used  Substance and Sexual Activity  . Alcohol use: No    Alcohol/week: 0.0 standard drinks  . Drug use: No  . Sexual activity: Never  Lifestyle  . Physical activity:    Days per week: Not on file    Minutes per session: Not on file  . Stress: Not on file  Relationships  . Social connections:    Talks on phone: Not on file    Gets together: Not on file    Attends religious service: Not on file    Active member of club or organization: Not on file    Attends meetings of clubs or organizations: Not on file    Relationship status: Not on file  Other Topics Concern  . Not on file  Social History Narrative   Lives with sister, no pets   Occupation: disabled, for bipolar and arthritis   Edu: GED   Activity: take walks   Diet: good water, vegetables daily   Religion: [place], Dr. Jimmye Norman (ph [phone])   Additional Social History:                         Sleep: Poor  Appetite:  Fair  Current Medications: Current Facility-Administered Medications  Medication Dose Route Frequency Provider Last Rate Last Dose  . acetaminophen (TYLENOL) tablet 650 mg  650 mg Oral Q6H PRN Dodi Leu,  [place] Wingard B, [name]   650 mg at 03/31/18 1558  . alum & mag hydroxide-simeth (MAALOX/MYLANTA) 200-200-20 MG/5ML suspension 30 mL  30 mL Oral Q4H PRN Weston Fulco B, [name]   30 mL at 03/20/18 1009  . amantadine (SYMMETREL) capsule 100 mg  100 mg Oral BID Spence Soberano B, [name]   100 mg at 04/01/18 0810  . bisacodyl (DULCOLAX) EC tablet 5 mg  5 mg Oral Daily PRN Erika Hussar B, [name]   5 mg at 03/18/18 0333  . carbamazepine (TEGRETOL) tablet 200 mg  200 mg Oral BID AC & HS Rylin Seavey B, [name]   200 mg at 04/01/18 0812  . diclofenac (VOLTAREN) EC tablet 75 mg  75 mg Oral BID Jyles Sontag B, [name]   75 mg at 04/01/18 0810  . docusate sodium (COLACE) capsule 100 mg  100 mg Oral BID Shelbee Apgar B, [name]   100 mg at 04/01/18 0810  . hydrocortisone (ANUSOL-HC) 2.5 % rectal cream   Rectal QID Harmonii Karle B, [name]      . levothyroxine (SYNTHROID, LEVOTHROID) tablet 50 mcg  50 mcg Oral QAC breakfast Ashlley Booher B, [name]   50 mcg at 04/01/18 0654  . linagliptin (TRADJENTA) tablet 5 mg  5 mg Oral Daily Derian Dimalanta B, [name]   5 mg at 04/01/18 0810  . magnesium hydroxide (MILK OF MAGNESIA) suspension 30 mL  30 mL Oral Daily PRN Dalvin Clipper B, [name]   30 mL at 03/18/18 0333  . metoprolol tartrate (LOPRESSOR) tablet 25 mg  25 mg Oral BID Jovan Schickling B, [name]   25 mg at 04/01/18 0810  . polyethylene glycol (MIRALAX / GLYCOLAX) packet 17 g  17 g Oral Daily Aarushi Hemric B, [name]   17 g at 04/01/18 0811  . QUEtiapine (SEROQUEL) tablet 400 mg  400 mg Oral QHS Alizay Bronkema B, [name]   400 mg at 03/31/18 2152  .  simethicone (MYLICON) chewable tablet 80 mg  80 mg Oral QID Demitrius Crass B, [name]   80 mg at 04/01/18 1122  . simvastatin (ZOCOR) tablet 20 mg  20 mg Oral q1800 Monaye Blackie B, [name]   20 mg at 03/31/18 1800  . ziprasidone (GEODON) capsule 80 mg  80 mg Oral BID WC Sheilah Rayos B, [name]   80 mg at 04/01/18 0810    Lab Results:  Results for orders  placed or performed during the hospital encounter of 03/16/18 (from the past 48 hour(s))  Glucose, capillary     Status: Abnormal   Collection Time: 03/30/18  3:13 PM  Result Value Ref Range   Glucose-Capillary 132 (H) 70 - 99 mg/dL   Comment 1 Notify RN   Comprehensive metabolic panel     Status: Abnormal   Collection Time: 03/30/18  3:23 PM  Result Value Ref Range   Sodium 129 (L) 135 - 145 mmol/L   Potassium 5.3 (H) 3.5 - 5.1 mmol/L   Chloride 95 (L) 98 - 111 mmol/L   CO2 23 22 - 32 mmol/L   Glucose, Bld 126 (H) 70 - 99 mg/dL   BUN 41 (H) 8 - 23 mg/dL   Creatinine, Ser 1.74 (H) 0.44 - 1.00 mg/dL   Calcium 9.0 8.9 - 10.3 mg/dL   Total Protein 6.9 6.5 - 8.1 g/dL   Albumin 4.0 3.5 - 5.0 g/dL   AST 21 15 - 41 U/L   ALT 25 0 - 44 U/L   Alkaline Phosphatase 93 38 - 126 U/L   Total Bilirubin 0.5 0.3 - 1.2 mg/dL   GFR calc non Af Amer 29 (L) >60 mL/min   GFR calc Af Amer 34 (L) >60 mL/min    Comment: (NOTE) The eGFR has been calculated using the CKD EPI equation. This calculation has not been validated in all clinical situations. eGFR's persistently <60 mL/min signify possible Chronic Kidney Disease.    Anion gap 11 5 - 15    Comment: Performed at [name], [address] [place], [place] [address]  CBC with Differential/Platelet     Status: Abnormal   Collection Time: 03/30/18  3:23 PM  Result Value Ref Range   WBC 7.2 3.6 - 11.0 K/uL   RBC 3.25 (L) 3.80 - 5.20 MIL/uL   Hemoglobin 11.2 (L) 12.0 - 16.0 g/dL   HCT 30.7 (L) 35.0 - 47.0 %   MCV 94.5 80.0 - 100.0 fL   MCH 34.4 (H) 26.0 - 34.0 pg   MCHC 36.4 (H) 32.0 - 36.0 g/dL   RDW 14.4 11.5 - 14.5 %   Platelets 174 150 - 440 K/uL   Neutrophils Relative % 69 %   Neutro Abs 5.0 1.4 - 6.5 K/uL   Lymphocytes Relative 21 %   Lymphs Abs 1.5 1.0 - 3.6 K/uL   Monocytes Relative 8 %   Monocytes Absolute 0.6 0.2 - 0.9 K/uL   Eosinophils Relative 2 %   Eosinophils Absolute 0.1 0 - 0.7 K/uL   Basophils Relative 0 %    Basophils Absolute 0.0 0 - 0.1 K/uL    Comment: Performed at [facility], [address] [place], [place] [address]  Glucose, capillary     Status: Abnormal   Collection Time: 03/30/18  6:20 PM  Result Value Ref Range   Glucose-Capillary 144 (H) 70 - 99 mg/dL  Basic metabolic panel     Status: Abnormal   Collection Time: 03/31/18  6:33 AM  Result Value Ref Range   Sodium 130 (L) 135 - 145 mmol/L   Potassium 5.3 (H) 3.5 - 5.1 mmol/L   Chloride 96 (L) 98 - 111 mmol/L   CO2 24 22 - 32 mmol/L   Glucose, Bld 102 (H) 70 - 99 mg/dL   BUN 41 (H) 8 - 23 mg/dL   Creatinine, Ser 1.94 (H) 0.44 - 1.00 mg/dL   Calcium 9.2 8.9 - 10.3 mg/dL   GFR calc non Af Amer 26 (L) >60 mL/min   GFR calc Af Amer 30 (L) >60 mL/min    Comment: (NOTE) The eGFR has been calculated using the CKD EPI equation. This calculation has not been validated in all clinical situations. eGFR's persistently <60 mL/min signify possible Chronic Kidney Disease.    Anion gap 10 5 - 15    Comment: Performed at [name], [address] [place], [place] [address]    Blood Alcohol level:  Lab Results  Component Value Date   [name] <10 03/11/2018   ETH <10 90/30/0923    Metabolic Disorder Labs: Lab Results  Component Value Date   HGBA1C 6.6 (H) 02/18/2018   MPG 122.63 11/14/2017   MPG 114 04/14/2012   Lab Results  Component Value Date   PROLACTIN 33.6 (H) 12/04/2015   Lab Results  Component Value Date   CHOL 140 11/14/2017   TRIG 152 (H) 11/14/2017   HDL 50 11/14/2017   CHOLHDL 2.8 11/14/2017   VLDL 30 11/14/2017   LDLCALC 60 11/14/2017   LDLCALC 81 09/30/2017    Physical Findings: AIMS:  , ,  ,  ,    CIWA:    COWS:     Musculoskeletal: Strength & Muscle Tone: within normal limits Gait & Station: normal Patient leans: N/A  Psychiatric Specialty Exam: Physical Exam  Nursing note and vitals reviewed. Psychiatric: Her affect is labile and inappropriate. Her speech is rapid  and/or pressured. She is hyperactive. Thought content is paranoid and delusional. Cognition and memory are impaired. She expresses impulsivity. She is inattentive.    Review of Systems  Neurological: Negative.   Psychiatric/Behavioral: Positive for hallucinations. The patient has insomnia.   All other systems reviewed and are negative.   Blood pressure (!) 162/68, pulse 67, temperature 97.8 F (36.6 C), temperature source Oral, resp. rate 18, height 5' 2"  (1.575 m), weight 87.1 kg, SpO2 100 %.Body mass index is 35.12 kg/m.  General Appearance: Casual  Eye Contact:  Good  Speech:  Clear and Coherent and Pressured  Volume:  Increased  Mood:  Euphoric  Affect:  Congruent  Thought Process:  Disorganized, Irrelevant and Descriptions of Associations: Tangential  Orientation:  Full (Time, Place, and Person)  Thought Content:  Delusions and Paranoid Ideation  Suicidal Thoughts:  No  Homicidal Thoughts:  No  Memory:  Immediate;   Fair Recent;   Poor Remote;   Poor  Judgement:  Poor  Insight:  Lacking  Psychomotor Activity:  Increased  Concentration:  Concentration: Poor and Attention Span: Poor  Recall:  Poor  Fund of Knowledge:  Fair  Language:  Fair  Akathisia:  No  Handed:  Right  AIMS (if indicated):     Assets:  Communication Skills Desire for Improvement Financial Resources/Insurance Housing Resilience Social Support  ADL's:  Intact  Cognition:  WNL  Sleep:  Number of Hours: 7     Treatment Plan Summary: Daily contact with patient to assess and evaluate symptoms and progress in treatment and Medication management  Ms. Powless is a 67 year old female with a history of bipolar disorder admitted for a manic episode in the context of medication changes.She had severe insomnia and was given 30 mg of Restoril tonight causinghypotonia and was briefly hospitalized at ICU. She is still manic. She tolerates transition from Depakote to Lamictal well butmanic symptoms are on  the rise.  #Bipolar disorder,full blown mania now -continue Invega sustenna injection 234 mg every 28 days, next dose on 9/3(this is of doubtful benefit) -continue Seroquel 300 mg nightly  -continueTegretol 200 mg BID -continueGeodon to 80 mg BID  #Thrombocytopenia, resolved -Depakotewas discontinued  #Fall risk -PT  #Insomnia,slept 7 hours -Melatonin 10 mg nightly  #Arthritis -continueVoltaren 75 mg BID  #Sleep apnea -CPAP nightly  #Hypothyroidism -Continue Synthroid 50 mcg daily  #HTN, BP elevated -increase Metoprolol25 mg BID  -discontinue Losartan due to hyperkaliemia   #DM -Tradjenta 5 mg daily -ADA diet  #Constipation -bowel regimen -Anusol PRN  #Dislipidemia  -Zocor 20 mg daily  #Labs -Hemoglobin A1c was 6.6 and total cholesterol was 140 -head CT negative  #Disposition -discharge to home with the sister -follow up with [facility]  Orson Slick, [name] 04/01/2018, 1:44 PM

## 2018-03-31 NOTE — Plan of Care (Signed)
Pt. Verbalizes understanding of provided education, but needs reinforcement. Pt. Continues to present animated and euphoric with pressured speech, but overall improved and to a lesser extent this evening. Pt. Participation in groups good and snacks. Pt. Complaint with medications. Pt. Denies SI/Hi and verbally is able to contract for safety.    Problem: Education: Goal: Knowledge of [place] [facility] Education information/materials will improve Outcome: Progressing Goal: Emotional status will improve Outcome: Progressing Goal: Mental status will improve Outcome: Progressing   Problem: Activity: Goal: Interest or engagement in activities will improve Outcome: Progressing   Problem: Health Behavior/Discharge Planning: Goal: Compliance with treatment plan for underlying cause of condition will improve Outcome: Progressing   Problem: Safety: Goal: Periods of time without injury will increase Outcome: Progressing   Problem: Safety: Goal: Ability to disclose and discuss suicidal ideas will improve Outcome: Progressing

## 2018-03-31 NOTE — Progress Notes (Signed)
D- Patient alert and oriented. Patient presented in a pleasant mood on assessment stating that she slept "better" last night and has complaints of pain this morning in her knee, back, and head. Patient did not request any pain medication other than her prescribed medication. Patient denies SI, HI, AVH, at this time. Patient also denies depression and anxiety stating "I'm a happy person". Patient's goal for today is to "get well".  A- Scheduled medications administered to patient, per MD orders. Support and encouragement provided.  Routine safety checks conducted every 15 minutes.  Patient informed to notify staff with problems or concerns.  R- No adverse drug reactions noted. Patient contracts for safety at this time. Patient compliant with medications and treatment plan. Patient receptive, calm, and cooperative. Patient interacts well with others on the unit.  Patient remains safe at this time.

## 2018-03-31 NOTE — Progress Notes (Signed)
D: Pt denies SI/HI/AVH, verbally is able to contract for safety. Pt. Continues to present euphoric, animated, with preoccupations and pressured speech, but to an improved/lesser extent. Pt. Able to focus a little more this evening. Pt is pleasant and cooperative, but is more demanding and expresses multiple needs frequently for staff to address that are random in nature. Patient Interaction appropriate with staff and peers overall. Pt. Denies pain and reports, "oh no I'm alright, I'll let you know". Pt. Not reporting any complications from fall the other day.   A: Q x 15 minute observation checks were completed for safety. Patient was provided with education, needs reinforcement.  Patient was given/offered medications per orders. Patient  was encourage to attend groups, participate in unit activities and continue with plan of care. Pt. Chart and plans of care reviewed. Pt. Given support and encouragement.   R: Patient is complaint with medication and unit procedures. Pt. Given extensive falls risks education for safety. Pt. Utilizes front wheel walker for safety. Pt. Chronic pain being managed per PRN medications. Pt. utilizes CPAP with no complications.               Precautionary checks every 15 minutes for safety maintained, room free of safety hazards, patient sustains no injury or falls during this shift. Will endorse care to next shift.

## 2018-03-31 NOTE — Progress Notes (Signed)
   03/31/18 1405  Clinical Encounter Type  Visited With Patient  Visit Type Follow-up;Spiritual support;Behavioral Health  Referral From Social work  Consult/Referral To Chaplain  Spiritual Encounters  Spiritual Needs Emotional;Other (Comment)   [place] visited with Ms. Ke while rounding on the [place]. The patient is interested in any chapel service we may have. I informed her that on Friday at 3:00pm will be a group time for spiritual support. I will remind her again on Friday.

## 2018-03-31 NOTE — Progress Notes (Signed)
MD notified about patient's high blood pressure reading.

## 2018-03-31 NOTE — Progress Notes (Signed)
This Probation officer was informed that patient's sister is upset about patient's toe nails not being cut and stating that patient needs to be seen by Podiatry. Sister also stated that patient has been calling her complaining about her walker. This Probation officer notified MD and a consult was put in for PT to follow up with patient.

## 2018-03-31 NOTE — Progress Notes (Signed)
Pharmacy called and stated that they would like RN to hold patient's scheduled Losartan until her lab results come back for elevated Potassium and Creatinine levels.

## 2018-03-31 NOTE — Plan of Care (Signed)
Patient verbalizes understanding of the general information that's been provided to her and all questions/concerns have been addressed and answered at this time. Patient denies SI/HI/AVH. Patient also denies any signs/symptoms of depression/anxiety at this time stating "I'm a happy person". Patient continues to show interest in unit activities by attending/participating in unit groups, going outside to the courtyard with staff and other members of the unit, as well as being present in the milieu without any issues at this time. Patient has the ability to verbalize her anger and frustrations in an appropriate manner and continues to demonstrate self-control on the unit. Patient verbalizes understanding and has been in compliance with her prescribed therapeutic/medication regimen. Patient has been free from injury and remains safe on the unit at this time.  Problem: Education: Goal: Knowledge of [facility] Education information/materials will improve Outcome: Progressing Goal: Emotional status will improve Outcome: Progressing Goal: Mental status will improve Outcome: Progressing Goal: Verbalization of understanding the information provided will improve Outcome: Progressing   Problem: Activity: Goal: Interest or engagement in activities will improve Outcome: Progressing Goal: Sleeping patterns will improve Outcome: Progressing   Problem: Coping: Goal: Ability to verbalize frustrations and anger appropriately will improve Outcome: Progressing Goal: Ability to demonstrate self-control will improve Outcome: Progressing   Problem: Health Behavior/Discharge Planning: Goal: Compliance with treatment plan for underlying cause of condition will improve Outcome: Progressing   Problem: Safety: Goal: Periods of time without injury will increase Outcome: Progressing   Problem: Activity: Goal: Interest or engagement in leisure activities will improve Outcome: Progressing Goal: Imbalance  in normal sleep/wake cycle will improve Outcome: Progressing   Problem: Health Behavior/Discharge Planning: Goal: Ability to make decisions will improve Outcome: Progressing Goal: Compliance with therapeutic regimen will improve Outcome: Progressing   Problem: Safety: Goal: Ability to disclose and discuss suicidal ideas will improve Outcome: Progressing Goal: Ability to identify and utilize support systems that promote safety will improve Outcome: Progressing   Problem: Safety: Goal: Ability to remain free from injury will improve Outcome: Progressing

## 2018-03-31 NOTE — Tx Team (Signed)
Interdisciplinary Treatment and Diagnostic Plan Update  03/31/2018 Time of Session: 10:50 AM Gina [name] MRN: [id number]  Principal Diagnosis: Bipolar I disorder, current or most recent episode manic, with psychotic features ([place])  Secondary Diagnoses: Principal Problem:   Bipolar I disorder, current or most recent episode manic, with psychotic features ([place]) Active Problems:   HTN (hypertension), benign   OSA on CPAP   Osteoarthritis of knee   Thrombocytopenia ([place])   Acquired hypothyroidism   Current Medications:  Current Facility-Administered Medications  Medication Dose Route Frequency Provider Last Rate Last Dose  . acetaminophen (TYLENOL) tablet 650 mg  650 mg Oral Q6H PRN Pucilowska, Jolanta B, MD   650 mg at 03/31/18 1558  . alum & mag hydroxide-simeth (MAALOX/MYLANTA) 200-200-20 MG/5ML suspension 30 mL  30 mL Oral Q4H PRN Pucilowska, Jolanta B, MD   30 mL at 03/20/18 1009  . amantadine (SYMMETREL) capsule 100 mg  100 mg Oral BID Pucilowska, Jolanta B, MD   100 mg at 03/31/18 0857  . bisacodyl (DULCOLAX) EC tablet 5 mg  5 mg Oral Daily PRN Pucilowska, Jolanta B, MD   5 mg at 03/18/18 0333  . carbamazepine (TEGRETOL) tablet 200 mg  200 mg Oral BID AC & HS Pucilowska, Jolanta B, MD   200 mg at 03/31/18 0858  . diclofenac (VOLTAREN) EC tablet 75 mg  75 mg Oral BID Pucilowska, Jolanta B, MD   75 mg at 03/31/18 0857  . docusate sodium (COLACE) capsule 100 mg  100 mg Oral BID Pucilowska, Jolanta B, MD   100 mg at 03/31/18 0857  . hydrocortisone (ANUSOL-HC) 2.5 % rectal cream   Rectal QID Pucilowska, Jolanta B, MD      . levothyroxine (SYNTHROID, LEVOTHROID) tablet 50 mcg  50 mcg Oral QAC breakfast Pucilowska, Jolanta B, MD   50 mcg at 03/31/18 0625  . linagliptin (TRADJENTA) tablet 5 mg  5 mg Oral Daily Pucilowska, Jolanta B, MD   5 mg at 03/31/18 0857  . magnesium hydroxide (MILK OF MAGNESIA) suspension 30 mL  30 mL Oral Daily PRN Pucilowska, Jolanta B, MD   30 mL at  03/18/18 0333  . metoprolol tartrate (LOPRESSOR) tablet 12.5 mg  12.5 mg Oral BID Pucilowska, Jolanta B, MD   12.5 mg at 03/31/18 0904  . polyethylene glycol (MIRALAX / GLYCOLAX) packet 17 g  17 g Oral Daily Pucilowska, Jolanta B, MD   17 g at 03/31/18 0856  . QUEtiapine (SEROQUEL) tablet 400 mg  400 mg Oral QHS Pucilowska, Jolanta B, MD   400 mg at 03/30/18 2126  . simethicone (MYLICON) chewable tablet 80 mg  80 mg Oral QID Pucilowska, Jolanta B, MD   80 mg at 03/31/18 1200  . simvastatin (ZOCOR) tablet 20 mg  20 mg Oral q1800 Pucilowska, Jolanta B, MD   20 mg at 03/30/18 1810  . ziprasidone (GEODON) capsule 80 mg  80 mg Oral BID WC Pucilowska, Jolanta B, MD   80 mg at 03/31/18 0857   PTA Medications: Medications Prior to Admission  Medication Sig Dispense Refill Last Dose  . ACCU-CHEK AVIVA PLUS test strip 1 each by Other route daily. E11.21 100 each 3 Taking  . clotrimazole (LOTRIMIN) 1 % cream Apply 1 application topically 2 (two) times daily. Apply to toes   Past Month at Unknown time  . diclofenac sodium (VOLTAREN) 1 % GEL Apply 4 g topically 4 (four) times daily. To knees 5 Tube 5 Past Month at Unknown time  . divalproex (DEPAKOTE)  250 MG DR tablet Take 250 mg by mouth 2 (two) times daily.   unknown at unknown  . docusate sodium (COLACE) 100 MG capsule Take 2 capsules (200 mg total) by mouth 2 (two) times daily. 60 capsule 1 Past Month at Unknown time  . GAVILAX powder TAKE 17 GRAMS BY MOUTH TWICE DAILY AS NEEDED FOR MODERATE CONSTIPATION. (Patient taking differently: Take 17 g by mouth 2 (two) times daily as needed for moderate constipation. ) 510 g 1 PRN at PRN  . lamoTRIgine (LAMICTAL) 25 MG tablet Take 2 tablets (50 mg total) by mouth 2 (two) times daily. (Patient taking differently: Take 50 mg by mouth every morning. ) 120 tablet 1 Past Month at Unknown time  . levothyroxine (SYNTHROID, LEVOTHROID) 50 MCG tablet Take 1 tablet (50 mcg total) by mouth daily before breakfast. 30 tablet 11  Past Month at Unknown time  . losartan (COZAAR) 50 MG tablet Take 1 tablet (50 mg total) by mouth daily. 30 tablet 11 Past Month at Unknown time  . metoprolol tartrate (LOPRESSOR) 25 MG tablet Take 0.5 tablets (12.5 mg total) by mouth 2 (two) times daily. 60 tablet 1 Past Month at Unknown time  . paliperidone (INVEGA SUSTENNA) 234 MG/1.5ML SUSP injection Inject 234 mg into the muscle every 30 (thirty) days.   Past Month at Unknown time  . QUEtiapine (SEROQUEL) 300 MG tablet Take 1 tablet (300 mg total) by mouth at bedtime.   Past Month at Unknown time  . simvastatin (ZOCOR) 20 MG tablet Take 1 tablet (20 mg total) by mouth at bedtime. 30 tablet 11 Past Month at Unknown time  . sitaGLIPtin (JANUVIA) 25 MG tablet Take 1 tablet (25 mg total) by mouth daily. 30 tablet 3 Past Month at Unknown time  . triamcinolone cream (KENALOG) 0.1 % Apply 1 application topically at bedtime. Apply to AA. 30 g 0 Past Month at Unknown time  . bisacodyl (DULCOLAX) 10 MG suppository Place 1 suppository (10 mg total) rectally daily as needed for moderate constipation. (Patient not taking: Reported on 03/16/2018) 12 suppository 0 Not Taking at Unknown time  . divalproex (DEPAKOTE) 500 MG DR tablet Take 1 tablet (500 mg total) by mouth 2 (two) times daily with a meal. (Patient not taking: Reported on 03/16/2018) 60 tablet 3 Not Taking at Unknown time  . senna (SENOKOT) 8.6 MG TABS tablet Take 1 tablet (8.6 mg total) by mouth at bedtime as needed for mild constipation. (Patient not taking: Reported on 03/16/2018) 120 each 0 Not Taking at Unknown time    Patient Stressors: Medication change or noncompliance  Patient Strengths: Ability for insight Motivation for treatment/growth Religious Affiliation Supportive family/friends  Treatment Modalities: Medication Management, Group therapy, Case management,  1 to 1 session with clinician, Psychoeducation, Recreational therapy.   Physician Treatment Plan for Primary Diagnosis:  Bipolar I disorder, current or most recent episode manic, with psychotic features ([place]) Long Term Goal(s): Improvement in symptoms so as ready for discharge NA   Short Term Goals: Ability to identify changes in lifestyle to reduce recurrence of condition will improve Ability to verbalize feelings will improve Ability to disclose and discuss suicidal ideas Ability to demonstrate self-control will improve Ability to identify and develop effective coping behaviors will improve Ability to maintain clinical measurements within normal limits will improve Ability to identify triggers associated with substance abuse/mental health issues will improve NA  Medication Management: Evaluate patient's response, side effects, and tolerance of medication regimen.  Therapeutic Interventions: 1 to 1 sessions,  Unit Group sessions and Medication administration.  Evaluation of Outcomes: Progressing  Physician Treatment Plan for Secondary Diagnosis: Principal Problem:   Bipolar I disorder, current or most recent episode manic, with psychotic features ([place]) Active Problems:   HTN (hypertension), benign   OSA on CPAP   Osteoarthritis of knee   Thrombocytopenia ([facility])   Acquired hypothyroidism  Long Term Goal(s): Improvement in symptoms so as ready for discharge NA   Short Term Goals: Ability to identify changes in lifestyle to reduce recurrence of condition will improve Ability to verbalize feelings will improve Ability to disclose and discuss suicidal ideas Ability to demonstrate self-control will improve Ability to identify and develop effective coping behaviors will improve Ability to maintain clinical measurements within normal limits will improve Ability to identify triggers associated with substance abuse/mental health issues will improve NA     Medication Management: Evaluate patient's response, side effects, and tolerance of medication regimen.  Therapeutic Interventions: 1 to 1 sessions,  Unit Group sessions and Medication administration.  Evaluation of Outcomes: Progressing   RN Treatment Plan for Primary Diagnosis: Bipolar I disorder, current or most recent episode manic, with psychotic features ([place]) Long Term Goal(s): Knowledge of disease and therapeutic regimen to maintain health will improve  Short Term Goals: Ability to remain free from injury will improve, Ability to demonstrate self-control and Compliance with prescribed medications will improve  Medication Management: RN will administer medications as ordered by provider, will assess and evaluate patient's response and provide education to patient for prescribed medication. RN will report any adverse and/or side effects to prescribing provider.  Therapeutic Interventions: 1 on 1 counseling sessions, Psychoeducation, Medication administration, Evaluate responses to treatment, Monitor vital signs and CBGs as ordered, Perform/monitor CIWA, COWS, AIMS and Fall Risk screenings as ordered, Perform wound care treatments as ordered.  Evaluation of Outcomes: Progressing   LCSW Treatment Plan for Primary Diagnosis: Bipolar I disorder, current or most recent episode manic, with psychotic features ([place]) Long Term Goal(s): Safe transition to appropriate next level of care at discharge, Engage patient in therapeutic group addressing interpersonal concerns.  Short Term Goals: Engage patient in aftercare planning with referrals and resources and Increase skills for wellness and recovery  Therapeutic Interventions: Assess for all discharge needs, 1 to 1 time with Social worker, Explore available resources and support systems, Assess for adequacy in community support network, Educate family and significant other(s) on suicide prevention, Complete Psychosocial Assessment, Interpersonal group therapy.  Evaluation of Outcomes: Progressing   Progress in Treatment: Attending groups: No. Participating in groups: No. Taking medication  as prescribed: Yes. Toleration medication: Yes. Family/Significant other contact made:yes, sister Patient understands diagnosis: Yes. Discussing patient identified problems/goals with staff: Yes. Medical problems stabilized or resolved: Yes. Denies suicidal/homicidal ideation: Yes. Issues/concerns per patient self-inventory: No. Other: n/a  New problem(s) identified: No, Describe:  No new problems identified  New Short Term/Long Term Goal(s):  Patient Goals:  "get my medication right"  Discharge Plan or Barriers: home and follow up with the ACTT team.  Pt is still improving, but still laughing inappropriately with disorganized thinking.  Reason for Continuation of Hospitalization: Mania Medication stabilization  Estimated Length of Stay: 5-7 days  Recreational Therapy: Patient Stressors: N/A Patient Goal: Patient will focus on task/topic with 2 prompts from staff within 5 recreation therapy group sessions  Attendees: Patient:   Physician: Orson Slick, MD 03/31/18  Nursing: Polly Cobia, RN 03/31/18  RN Care Manager:   Social Worker: Derrek Gu, LCSW 03/31/18  Recreational Therapist:  Other: Darin Engels, Latanya Presser 03/31/18  Other: Marney Doctor, Chaplain 03/31/18  Other:      Scribe for Treatment Team: August Saucer, LCSW 03/31/2018 4:25 PM

## 2018-03-31 NOTE — Progress Notes (Signed)
Recreation Therapy Notes  Date: 03/31/2018  Time: 9:30 am  Location: Craft [facility]  Behavioral response: Appropriate   Intervention Topic: Emotions  Discussion/Intervention:  Group content on today was focused on emotions. The group identified what emotions are and why it is important to have emotions. Patients expressed some positive and negative emotions. Individuals gave some past experiences on how they normally dealt with emotions in the past. The group described some positive ways to deal with emotions in the future. Patients participated in the intervention "The Situation" where individuals were given a chance to respond to certain situations involving their emotions.  Clinical Observations/Feedback:  Patient attended group and explained happy and sad are both emotions. She stated she listens to music, prays or cleans up to deal with her emotions. Individual was social with peers and staff while participating in the intervention.  Anavi Branscum LRT/CTRS         Darik Massing 03/31/2018 11:32 AM

## 2018-04-01 MED ORDER — LINACLOTIDE 290 MCG PO CAPS
290.0000 ug | ORAL_CAPSULE | Freq: Every day | ORAL | Status: DC
  Administered 2018-04-02 – 2018-04-05 (×4): 290 ug via ORAL
  Filled 2018-04-01 (×4): qty 1

## 2018-04-01 NOTE — Plan of Care (Signed)
Data: Patient is appropriate and cooperative to assessment. Patient denies SI/HI/AVH. Patient has completed daily self inventory worksheet. Patient has multiple complaints and is needy and a pain rating of 0/10. Patient reports fair sleep quality, appetite is good for the last 24 hours. Patient rates depression "0/10" , feelings of hopelessness "0/10" and anxiety "0/10" Patients goal for today is "get well."   Action:  Q x 15 minute observation checks were completed for safety. Patient was provided with education on medications. Patient was offered support and encouragement. Patient was given scheduled medications. Patient  was encourage to attend groups, participate in unit activities and continue with plan of care.     Response: Patient is complaint with medications and treatment. Patient has no complaints at this time. Patient is receptive to treatment and safety maintained on unit.    Problem: Activity: Goal: Interest or engagement in activities will improve Outcome: Progressing   Problem: Coping: Goal: Ability to verbalize frustrations and anger appropriately will improve Outcome: Progressing Goal: Ability to demonstrate self-control will improve Outcome: Progressing   Problem: Safety: Goal: Periods of time without injury will increase Outcome: Progressing   Problem: Education: Goal: Knowledge of [facility] [facility] Education information/materials will improve Outcome: Not Progressing Goal: Emotional status will improve Outcome: Not Progressing Goal: Mental status will improve Outcome: Not Progressing Goal: Verbalization of understanding the information provided will improve Outcome: Not Progressing

## 2018-04-01 NOTE — Progress Notes (Signed)
Patient ID: Gina [name], female   DOB: 04/30/1951, 67 y.o.   MRN: [id number] PER STATE REGULATIONS 482.30  THIS CHART WAS REVIEWED FOR MEDICAL NECESSITY WITH RESPECT TO THE PATIENT'S ADMISSION/ DURATION OF STAY.  NEXT REVIEW DATE: 2018/04/06 Chauncy Lean, RN, BSN CASE MANAGER

## 2018-04-01 NOTE — [facility] Group Notes (Signed)
04/01/2018 1PM  Type of Therapy/Topic:  Group Therapy:  Balance in Life  Participation Level:  Did Not Attend  Description of Group:   This group will address the concept of balance and how it feels and looks when one is unbalanced. Patients will be encouraged to process areas in their lives that are out of balance and identify reasons for remaining unbalanced. Facilitators will guide patients in utilizing problem-solving interventions to address and correct the stressor making their life unbalanced. Understanding and applying boundaries will be explored and addressed for obtaining and maintaining a balanced life. Patients will be encouraged to explore ways to assertively make their unbalanced needs known to significant others in their lives, using other group members and facilitator for support and feedback.  Therapeutic Goals: 1. Patient will identify two or more emotions or situations they have that consume much of in their lives. 2. Patient will identify signs/triggers that life has become out of balance:  3. Patient will identify two ways to set boundaries in order to achieve balance in their lives:  4. Patient will demonstrate ability to communicate their needs through discussion and/or role plays  Summary of Patient Progress: Patient was encouraged and invited to attend group. Patient did not attend group. Social worker will continue to encourage group participation in the future.        Therapeutic Modalities:   Cognitive Behavioral Therapy Solution-Focused Therapy Assertiveness Training  Darin Engels, [place]

## 2018-04-01 NOTE — [facility] Group Notes (Signed)
[facility] Group Notes:  (Nursing/MHT/Case Management/Adjunct)  Date:  04/01/2018  Time:  12:47 AM  Type of Therapy:  Group Therapy  Participation Level:  Active  Participation Quality:  Sharing  Affect:  Anxious  Cognitive:  Disorganized  Insight:  Lacking  Engagement in Group:  Off Topic  Modes of Intervention:  Discussion  Summary of Progress/Problems: Shital attended group. Adrain asked many questions throughout group. Annahi presented with flight of ideas. Nykia was not on topic when asked about her goals and day. Maira stated she had good insurance to pay for things. Gwenlyn rambled on about speaking her mind and something not being her fault. MHT reviewed rules and expectations of the unit. MHT processed with the patients about goal setting. MHT encouraged patients to work on goals that would prepare them for discharge. MHT addressed concerns about anxiety. MHT offered steps to address anxiety. MHT informed patients they would have to address the issues that cause anxiety. MHT explained how avoiding stressful things that cause anxiety only increases anxiety by avoiding it. MHT reviewed resources available in the community (providers, medication management, psychiatrist). [name] 04/01/2018, 12:47 AM

## 2018-04-01 NOTE — Progress Notes (Addendum)
[facility] MD Progress Note  04/02/2018 11:19 AM Gina [name]  MRN:  [id number]  Subjective:   Gina [name] is a 67 year old female with ahistory of bipolar disorder and multiple hospitalizations. She was maintained, with some success, on a combination of Invega sustenna injections, Seroquel and Depakote until her Depakote had to be discontinued for thrombocytopenia. In spite of slow Depakote taper, increase in Seroquel dose and addition of Lamicta, the patient became manic and had to be hospitalized. Her platelets normalized but the patient remains manic. She is now on Tegretol 200 mg BID and Geodon 80 mg BID and Seroquel 300 mg for sleep. Her kidneys will not tolerate Lithium anymore.  Gina [name] is still manic with pressured speech and racing disorganized thoughts. She is less religiously preoccupied today but wants to discuss "spinach" because it makes her feel better. She is somatic and today complains od "blurred vision". Her sleep has been interrupted and she called nurses several times through the night.    Principal Problem: Bipolar I disorder, current or most recent episode manic, with psychotic features ([place]) Diagnosis:   Patient Active Problem List   Diagnosis Date Noted  . Bipolar I disorder, current or most recent episode manic, with psychotic features ([place]) [F31.2] 11/04/2017    Priority: High  . Altered mental status, unspecified [R41.82] 03/16/2018  . Hypotension [I95.9] 03/15/2018  . Rash of hands [R21] 02/18/2018  . Pain of right heel [M79.671] 12/30/2017  . Encounter for chronic pain management [G89.29] 12/18/2017  . Foot pain, bilateral [id number], M79.672] 11/25/2017  . Gallbladder polyp [K82.4] 10/21/2017  . Left shoulder pain [M25.512] 10/21/2017  . Benign neoplasm of ascending colon [D12.2]   . Low back pain radiating to left lower extremity [M54.5, M79.605] 02/26/2017  . Acquired hypothyroidism [E03.9] 09/10/2016  . Thrombocytopenia ([place]) [D69.6] 06/15/2016   . Medicare annual wellness visit, subsequent [Z00.00] 06/04/2016  . Advanced care planning/counseling discussion [Z71.89] 06/04/2016  . External hemorrhoid [K64.4] 04/02/2016  . Slow transit constipation [K59.01] 01/04/2016  . Bipolar I disorder, most recent episode (or current) manic ([place]) [F31.10] 12/03/2015  . NAFLD (nonalcoholic fatty liver disease) [K76.0] 03/28/2015  . Gallbladder calculus without cholecystitis [K80.20] 03/28/2015  . Gout [M10.9] 03/27/2015  . HCV antibody positive [R76.8] 03/27/2015  . History of bladder cancer [Z85.51] 03/27/2015  . History of bundle branch block [Z86.79] 03/27/2015  . Genetic testing [Z13.79] 02/28/2015  . Loss of memory [R41.3] 09/04/2014  . Controlled type 2 diabetes mellitus with diabetic nephropathy ([place]) [E11.21] 06/07/2014  . Hyperlipidemia [E78.5]   . Anemia in CKD (chronic kidney disease) [N18.9, D63.1] 01/26/2013  . Personal history of colonic adenomas and colon cancer [Z86.010] 11/11/2012  . Osteoarthritis of knee [M17.10] 10/02/2012  . OSA on CPAP [G47.33, Z99.89]   . CKD (chronic kidney disease) stage 4, GFR 15-29 ml/min (HCC) [N18.4]   . Severe obesity (BMI 35.0-39.9) with comorbidity ([name]) [E66.01]   . HTN (hypertension), benign [I10] 04/07/2012  . Hx of colon cancer, stage I [Z85.038] 08/05/2011   Total Time spent with patient: 20 minutes  Past Psychiatric History: bipolar disorder  Past Medical History:  Past Medical History:  Diagnosis Date  . Anemia in chronic kidney disease   . Bipolar depression [facility])    sees psychiatrist - psych admission 03/2015  . Bladder cancer ([place]) 1990's  . Blood transfusion without reported diagnosis 2009  . Cataract    left  . CKD (chronic kidney disease) stage 3, GFR 30-59 ml/min (HCC)   . Colon cancer (  [place]) 1990's  . DJD (degenerative joint disease), lumbar    chronic lower back pain  . Enterocutaneous fistula 04/07/2012   completed PT 06/2012 ([facility])  . Family history of breast  cancer   . Family history of colon cancer   . Family history of stomach cancer   . GERD (gastroesophageal reflux disease)   . History of bladder cancer 1997  . History of colon cancer    s/p surgery  . History of uterine cancer    s/p hysterectomy  . Hyperlipidemia   . Hypertension   . IDA (iron deficiency anemia) 01/2013   thought due to h/o polyps  . Obesity (BMI 30-39.9)   . OSA on CPAP    6cm H2O  . Personal history of colonic adenomas and colon cancer 11/11/2012  . Positive hepatitis C antibody test 09/2014   but negative confirmatory testing  . RBBB   . Uncontrolled type 2 diabetes mellitus with nephropathy ([place]) 06/07/2014   completed DSME 02/2016    Past Surgical History:  Procedure Laterality Date  . BREAST BIOPSY Right 01/2014   fibroadenoma  . COLONOSCOPY  07/2009  . COLONOSCOPY  11/2012   2 tubular adenomas, mild diverticulosis, pending genetic testing for Lynch syndrome [name]) rpt 2 yrs  . COLONOSCOPY  02/2015   TA, diverticulosis, rpt 2 yrs [name])  . COLONOSCOPY WITH PROPOFOL N/A 06/09/2017   TAx1, rpt 2 yrs [name], [name], MD)  . DEXA  12/2009   WNL  . DOBUTAMINE STRESS ECHO  12/2009   no evidence of ischemia  . HERNIA REPAIR  02/04/12  . i & d abdominal wound  02/19/12  . LEFT OOPHORECTOMY  2005  . PARTIAL COLECTOMY  about 2008   for colon cancer  . PARTIAL HYSTERECTOMY  1981   uterine cancer, R ovary remains  . Reexploration of abdominal wound and Allograft placemet  02/26/12  . Removal of infected mesh and abdominal wound vac placement  02/24/12  . sleep study  02/2014   OSA - AHI 55, nadir 81% [name])   Family History:  Family History  Problem Relation Age of Onset  . Colon cancer Mother 35  . Stomach cancer Mother        dx in her 54s?  . CAD Father        MI; deceased 12  . Mental illness Sister        anxiety/depression  . Thyroid disease Sister   . Breast cancer Maternal Grandmother        age at dx unknown  . Diabetes Brother   .  Diabetes Brother   . Diabetes Other        aunts and uncles both sides  . Arthritis Other        strong fmhx  . Colon cancer Other 23       maternal half-sister; deceased   Family Psychiatric  History: none Social History:  Social History   Substance and Sexual Activity  Alcohol Use No  . Alcohol/week: 0.0 standard drinks     Social History   Substance and Sexual Activity  Drug Use No    Social History   Socioeconomic History  . Marital status: Single    Spouse name: Not on file  . Number of children: 0  . Years of education: Not on file  . Highest education level: Not on file  Occupational History  . Not on file  Social Needs  . Financial resource strain: Not on file  .  Food insecurity:    Worry: Not on file    Inability: Not on file  . Transportation needs:    Medical: Not on file    Non-medical: Not on file  Tobacco Use  . Smoking status: Never Smoker  . Smokeless tobacco: Never Used  Substance and Sexual Activity  . Alcohol use: No    Alcohol/week: 0.0 standard drinks  . Drug use: No  . Sexual activity: Never  Lifestyle  . Physical activity:    Days per week: Not on file    Minutes per session: Not on file  . Stress: Not on file  Relationships  . Social connections:    Talks on phone: Not on file    Gets together: Not on file    Attends religious service: Not on file    Active member of club or organization: Not on file    Attends meetings of clubs or organizations: Not on file    Relationship status: Not on file  Other Topics Concern  . Not on file  Social History Narrative   Lives with sister, no pets   Occupation: disabled, for bipolar and arthritis   Edu: GED   Activity: take walks   Diet: good water, vegetables daily   Religion: Hansen, Dr. Jimmye Norman (ph [phone])   Additional Social History:                         Sleep: Poor  Appetite:  Fair  Current Medications: Current  Facility-Administered Medications  Medication Dose Route Frequency Provider Last Rate Last Dose  . acetaminophen (TYLENOL) tablet 650 mg  650 mg Oral Q6H PRN Chivonne Rascon B, MD   650 mg at 03/31/18 1558  . alum & mag hydroxide-simeth (MAALOX/MYLANTA) 200-200-20 MG/5ML suspension 30 mL  30 mL Oral Q4H PRN Hadiyah Maricle B, MD   30 mL at 03/20/18 1009  . amantadine (SYMMETREL) capsule 100 mg  100 mg Oral BID Elga Santy B, MD   100 mg at 04/02/18 0747  . bisacodyl (DULCOLAX) EC tablet 5 mg  5 mg Oral Daily PRN Alexei Doswell B, MD   5 mg at 03/18/18 0333  . carbamazepine (TEGRETOL) tablet 200 mg  200 mg Oral BID AC & HS Morgana Rowley B, MD   200 mg at 04/02/18 0751  . diclofenac (VOLTAREN) EC tablet 75 mg  75 mg Oral BID Sharday Michl B, MD   75 mg at 04/02/18 0749  . docusate sodium (COLACE) capsule 100 mg  100 mg Oral BID Tyler Cubit B, MD   100 mg at 04/02/18 0747  . hydrocortisone (ANUSOL-HC) 2.5 % rectal cream   Rectal QID Ankur Snowdon B, MD      . levothyroxine (SYNTHROID, LEVOTHROID) tablet 50 mcg  50 mcg Oral QAC breakfast Rett Stehlik B, MD   50 mcg at 04/02/18 0605  . linaclotide (LINZESS) capsule 290 mcg  290 mcg Oral QAC breakfast Demari Kropp B, MD   290 mcg at 04/02/18 0749  . linagliptin (TRADJENTA) tablet 5 mg  5 mg Oral Daily Delecia Vastine B, MD   5 mg at 04/02/18 0747  . magnesium hydroxide (MILK OF MAGNESIA) suspension 30 mL  30 mL Oral Daily PRN Taralynn Quiett B, MD   30 mL at 03/18/18 0333  . metoprolol tartrate (LOPRESSOR) tablet 25 mg  25 mg Oral BID Gerell Fortson B, MD   25  mg at 04/02/18 0747  . polyethylene glycol (MIRALAX / GLYCOLAX) packet 17 g  17 g Oral Daily Luna Audia B, MD   17 g at 04/02/18 0747  . QUEtiapine (SEROQUEL) tablet 400 mg  400 mg Oral QHS Eulises Kijowski B, MD   400 mg at 04/01/18 2127  . simethicone (MYLICON) chewable tablet 80 mg  80 mg Oral QID Camelia Stelzner,  Jancie Kercher B, MD   80 mg at 04/02/18 0747  . simvastatin (ZOCOR) tablet 20 mg  20 mg Oral q1800 Vira Chaplin B, MD   20 mg at 04/01/18 1706  . ziprasidone (GEODON) capsule 80 mg  80 mg Oral BID WC Bland Rudzinski B, MD   80 mg at 04/02/18 0747    Lab Results:  No results found for this or any previous visit (from the past 48 hour(s)).  Blood Alcohol level:  Lab Results  Component Value Date   ETH <10 03/11/2018   ETH <10 51/09/5850    Metabolic Disorder Labs: Lab Results  Component Value Date   HGBA1C 6.6 (H) 02/18/2018   MPG 122.63 11/14/2017   MPG 114 04/14/2012   Lab Results  Component Value Date   PROLACTIN 33.6 (H) 12/04/2015   Lab Results  Component Value Date   CHOL 140 11/14/2017   TRIG 152 (H) 11/14/2017   HDL 50 11/14/2017   CHOLHDL 2.8 11/14/2017   VLDL 30 11/14/2017   LDLCALC 60 11/14/2017   LDLCALC 81 09/30/2017    Physical Findings: AIMS:  , ,  ,  ,    CIWA:    COWS:     Musculoskeletal: Strength & Muscle Tone: within normal limits Gait & Station: normal Patient leans: N/A  Psychiatric Specialty Exam: Physical Exam  Nursing note and vitals reviewed. Psychiatric: Her speech is normal. Her affect is labile and inappropriate. She is hyperactive. Thought content is paranoid and delusional. Cognition and memory are impaired. She expresses impulsivity.    Review of Systems  Gastrointestinal: Positive for constipation.  Musculoskeletal: Positive for back pain and joint pain.  Neurological: Positive for headaches.  Psychiatric/Behavioral: Positive for hallucinations.  All other systems reviewed and are negative.   Blood pressure (!) 152/77, pulse (!) 55, temperature 97.6 F (36.4 C), temperature source Oral, resp. rate 18, height 5\' 2"  (1.575 m), weight 87.1 kg, SpO2 100 %.Body mass index is 35.12 kg/m.  General Appearance: Casual  Eye Contact:  Good  Speech:  Pressured  Volume:  Increased  Mood:  Euphoric  Affect:  Congruent  Thought  Process:  Disorganized, Irrelevant and Descriptions of Associations: Tangential  Orientation:  Full (Time, Place, and Person)  Thought Content:  Delusions  Suicidal Thoughts:  No  Homicidal Thoughts:  No  Memory:  Immediate;   Poor Recent;   Poor Remote;   Poor  Judgement:  Impaired  Insight:  Shallow  Psychomotor Activity:  Increased  Concentration:  Concentration: Fair and Attention Span: Poor  Recall:  Poor  Fund of Knowledge:  Poor  Language:  Poor  Akathisia:  No  Handed:  Right  AIMS (if indicated):     Assets:  Communication Skills Desire for Improvement Financial Resources/Insurance Housing Physical Health Resilience Social Support  ADL's:  Intact  Cognition:  WNL  Sleep:  Number of Hours: 7     Treatment Plan Summary: Daily contact with patient to assess and evaluate symptoms and progress in treatment and Medication management   Ms. Petitti is a 67 year old female with a history of bipolar disorder  admitted for a manic episode in the context of medication changes.She had severe insomnia and was given 30 mg of Restoril tonight causinghypotonia and was briefly hospitalized at ICU. She is still manic. She tolerates transition from Depakote to Lamictal well butmanic symptoms are on the rise.  #Bipolar disorder, mania -we discontinued Invega sustenna injections as this is of doubtful benefit -continue Seroquel 400 mg nightly  -continueTegretol 200 mg BID, level in AM -continueGeodon to 80 mg BID  #Thrombocytopenia, resolved -Depakotewas discontinued -CBC in AM  #Fall risk -PT input is appreciated  #Arthritis -continueVoltaren 75 mg BID  #Sleep apnea -CPAP nightly  #Hypothyroidism -Continue Synthroid 50 mcg daily  #HTN, BP elevated -increase Metoprolol 25 mg BID  -discontinue Losartan due to hyperkaliemia, K level in AM   #DM -Tradjenta 5 mg daily -ADA diet  #Constipation -bowel regimen -Anusol PRN -add  Linzess  #Dislipidemia  -Zocor 20 mg daily  #Labs -Hemoglobin A1c was 6.6 and total cholesterol was 140  #Fall -head CT negative -PT consult is appreciated -patient has a new walker  #Disposition -discharge to home with the sister -sister will briefly visit on Saturday at noon -follow up with [facility]  Orson Slick, MD 04/02/2018, 11:19 AM

## 2018-04-01 NOTE — Progress Notes (Signed)
Recreation Therapy Notes  Date: 04/01/2018  Time: 9:30 am   Location: Craft Room   Behavioral response: N/A   Intervention Topic: Goals  Discussion/Intervention: Patient did not attend group.   Clinical Observations/Feedback:  Patient did not attend group.   Kristina Bertone LRT/CTRS        Gilad Dugger 04/01/2018 11:04 AM

## 2018-04-01 NOTE — Evaluation (Signed)
Physical Therapy Evaluation Patient Details Name: Gina [name] MRN: [id number] DOB: 11-Oct-1950 Today's Date: 04/01/2018   History of Present Illness  Patient is a 67 year old patient admitted for a manic episode with pshychotic features.  She fell during hospital stay and reported pain in back and HA.  PMH includes bipolar disorder, bladder CA, uterine CA, colon CA, Hep C, CKD stage III and DJD.  Clinical Impression  Patient is a 67 year old female who lives in a single story home with her sister.  She is independent with use of a RW at baseline.  Pt sustained a fall 8/27 during her hospital stay and has reported a HA and back pain since.  Pt alert and oriented but requires redirection during conversation.  She is able to perform bed mobility independently and sit at EOB without difficulty.  Pt requires hand held assist to stand from bedside and toilet, reporting that her home equipment is better suited to assist her.  Pt able to ambulate 100 ft with RW with no LOB's but with slowed gait and some deviations during higher level dynamic activity all pointing to fall risk.  She reported mild pain in back during mobility but pain was not a limiting factor.  Pt will benefit from skilled PT with focus on balance and fall prevention, safe functional mobility and strength.    Follow Up Recommendations Home health PT;Supervision for mobility/OOB    Equipment Recommendations  None recommended by PT    Recommendations for Other Services       Precautions / Restrictions Precautions Precautions: Fall Precaution Comments: High Fall Risk Restrictions Weight Bearing Restrictions: No      Mobility  Bed Mobility Overal bed mobility: Independent                Transfers Overall transfer level: Needs assistance Equipment used: Rolling walker (2 wheeled) Transfers: Sit to/from Stand Sit to Stand: Min assist         General transfer comment: Hand held assist to stand from bed.   Pt was able to stand from toilet with use of grab bar and PT's hand.  States she does not have this problem at home as she has two bars to pull on.  Ambulation/Gait Ambulation/Gait assistance: Supervision Gait Distance (Feet): 100 Feet Assistive device: Rolling walker (2 wheeled)     Gait velocity interpretation: 1.31 - 2.62 ft/sec, indicative of limited community ambulator General Gait Details: Low to moderate foot clearance, flexed posture, able to navigate obstacles appropriately.  Pt requested a different RW with stoppers on the rear insteady of sleds and PT will provide next visit.  Stairs            Wheelchair Mobility    Modified Rankin (Stroke Patients Only)       Balance Overall balance assessment: Needs assistance Sitting-balance support: Feet supported Sitting balance-Leahy Scale: Good     Standing balance support: Single extremity supported Standing balance-Leahy Scale: Fair Standing balance comment: Can stand with single extremity support but RW recommended due to impulsivity and frequent unsteadiness on feet.  Maintains a wide stance for balance.             High level balance activites: Side stepping;Backward walking;Direction changes;Sudden stops High Level Balance Comments: Pt slows gait to perform higher level dynamic gait activity.  No LOB's noted.             Pertinent Vitals/Pain Pain Assessment: Faces Faces Pain Scale: Hurts a little bit Pain Location:  mid-low back on L side Pain Intervention(s): Monitored during session    Home Living Family/patient expects to be discharged to:: Private residence Living Arrangements: Other relatives(Sister)   Type of Home: House Home Access: Level entry     Home Layout: One level Home Equipment: Shower seat;Grab bars - toilet      Prior Function Level of Independence: Independent with assistive device(s)         Comments: Patient reports using a RW with tennis balls on it at home as well  as having a better bathroom setup than her hospital room.  She is ambulatory with RW and does not receive assistance with ADL's.     Hand Dominance        Extremity/Trunk Assessment   Upper Extremity Assessment Upper Extremity Assessment: Generalized weakness    Lower Extremity Assessment Lower Extremity Assessment: Overall WFL for tasks assessed    Cervical / Trunk Assessment Cervical / Trunk Assessment: Kyphotic(Slightly kyphotic.)  Communication   Communication: Other (comment)(Pt perseverates on certain topics and at times begins to [facility] when speaking so that she is difficult to understand.  Requires redirection.)  Cognition Arousal/Alertness: Awake/alert Behavior During Therapy: WFL for tasks assessed/performed Overall Cognitive Status: Within Functional Limits for tasks assessed                                 General Comments: Follows commands consistently.      General Comments      Exercises     Assessment/Plan    PT Assessment Patient needs continued PT services  PT Problem List Decreased strength;Decreased mobility;Decreased balance;Decreased knowledge of use of DME;Decreased safety awareness       PT Treatment Interventions DME instruction;Therapeutic activities;Cognitive remediation;Gait training;Therapeutic exercise;Patient/family education;Balance training;Functional mobility training;Neuromuscular re-education    PT Goals (Current goals can be found in the Care Plan section)  Acute Rehab PT Goals Patient Stated Goal: To return home and live with her sister. To be able to walk without falling. PT Goal Formulation: With patient Time For Goal Achievement: 04/15/18 Potential to Achieve Goals: Good    Frequency Min 2X/week   Barriers to discharge        Co-evaluation               AM-PAC PT "6 Clicks" Daily Activity  Outcome Measure Difficulty turning over in bed (including adjusting bedclothes, sheets and blankets)?: A  Little Difficulty moving from lying on back to sitting on the side of the bed? : A Little Difficulty sitting down on and standing up from a chair with arms (e.g., wheelchair, bedside commode, etc,.)?: A Little Help needed moving to and from a bed to chair (including a wheelchair)?: A Little Help needed walking in hospital room?: A Little Help needed climbing 3-5 steps with a railing? : A Little 6 Click Score: 18    End of Session Equipment Utilized During Treatment: Gait belt Activity Tolerance: Patient tolerated treatment well Patient left: Other (comment)(PT walked with pt to dining area for lunch.)   PT Visit Diagnosis: Unsteadiness on feet (R26.81);Muscle weakness (generalized) (M62.81);History of falling (Z91.81)    Time: 5697-9480 PT Time Calculation (min) (ACUTE ONLY): 22 min   Charges:   PT Evaluation $PT Eval Low Complexity: 1 Low          Roxanne Gates, PT, DPT   Roxanne Gates 04/01/2018, 12:03 PM

## 2018-04-01 NOTE — Plan of Care (Addendum)
Patient found in day room upon my arrival. Patient is visible and social throughout the evening. Denies all complaints including SI/HI/AVH. Reports eating and voiding adequately. Ate appropriate snack. Compliant with HS medications and staff direction. Q 15 minute checks maintained. Will continue to monitor throughout the shift. Patient slept 7 hours. Restlessly. Patient was needy and calling for staff multiple times in the night. Compliant with am synthroid and VS. Slight elevation in systolic BP. Scheduled lopressor @ 0800. Will endorse car to oncoming shift.  Problem: Education: Goal: Knowledge of [place] [facility] Education information/materials will improve Outcome: Progressing Goal: Emotional status will improve Outcome: Progressing Goal: Mental status will improve Outcome: Progressing Goal: Verbalization of understanding the information provided will improve Outcome: Progressing   Problem: Activity: Goal: Interest or engagement in activities will improve Outcome: Progressing Goal: Sleeping patterns will improve Outcome: Progressing   Problem: Coping: Goal: Ability to verbalize frustrations and anger appropriately will improve Outcome: Progressing Goal: Ability to demonstrate self-control will improve Outcome: Progressing   Problem: Safety: Goal: Periods of time without injury will increase Outcome: Progressing   Problem: Health Behavior/Discharge Planning: Goal: Compliance with therapeutic regimen will improve Outcome: Progressing

## 2018-04-02 NOTE — Progress Notes (Signed)
Recreation Therapy Notes   Date: 04/02/2018  Time: 9:30 pm  Location: Outside  Behavioral response: Appropriate  Group Type: Leisure  Participation level: Active  Communication: Patient was social with peers and staff.  Comments: N/A  Amiel Mccaffrey LRT/CTRS        Kina Shiffman 04/02/2018 1:03 PM

## 2018-04-02 NOTE — Progress Notes (Signed)
Physical Therapy Treatment Patient Details Name: Gina [name] MRN: [id number] DOB: [birth year] Today's Date: 04/02/2018    History of Present Illness Patient is a 67 year old patient admitted for a manic episode with pshychotic features.  She fell during hospital stay and reported pain in back and HA.  PMH includes bipolar disorder, bladder CA, uterine CA, colon CA, Hep C, CKD stage III and DJD.    PT Comments    Patient was able to progress to standing balance exercises today.  She alert and oriented to self and situation but required verbal redirection to stay on task.  Pt experienced one LOB to L side during standing balance exercises and PT provided mod-max A to correct.  She was able to complete all sets of there ex without difficulty.  Pt reported L knee pain which was exacerbated by repeated STS and alleviated by seated LAQ's.  Pt able to "furniture cruise" to navigate furniture but PT discussed importance of use of RW for fall prevention. PT ensured that pt's RW is the proper height and pt stated that she finds the new RW provided by PT to be much easier to use.  Pt will continue to benefit from skilled PT with focus on balance and fall prevention, safe functional mobility and knee pain.   Follow Up Recommendations  Home health PT;Supervision for mobility/OOB     Equipment Recommendations  None recommended by PT    Recommendations for Other Services       Precautions / Restrictions Precautions Precautions: Fall Precaution Comments: High Fall Risk Restrictions Weight Bearing Restrictions: No    Mobility  Bed Mobility Overal bed mobility: Independent                Transfers Overall transfer level: Needs assistance Equipment used: Rolling walker (2 wheeled) Transfers: Sit to/from Stand Sit to Stand: Min assist         General transfer comment: Min A to rise from bed and chair in room.  Pt leans to R today due to reported L knee  pain.  Ambulation/Gait Ambulation/Gait assistance: Supervision Gait Distance (Feet): 100 Feet Assistive device: Rolling walker (2 wheeled)     Gait velocity interpretation: 1.31 - 2.62 ft/sec, indicative of limited community ambulator General Gait Details: Low to moderate foot clearance, flexed posture, able to navigate obstacles appropriately.  Pt's gait is slower today due to knee pain.  She is able to navigate obstacles in room by holding furniture but PT recommended use of RW for all other activity.   Stairs             Wheelchair Mobility    Modified Rankin (Stroke Patients Only)       Balance Overall balance assessment: Needs assistance Sitting-balance support: Feet supported Sitting balance-Leahy Scale: Good     Standing balance support: Single extremity supported Standing balance-Leahy Scale: Fair Standing balance comment: Can stand with single extremity support but RW recommended due to impulsivity and frequent unsteadiness on feet.  Maintains a wide stance for balance.               High Level Balance Comments: Pt slows gait to perform higher level dynamic gait activity.  No LOB's noted.            Cognition Arousal/Alertness: Awake/alert Behavior During Therapy: WFL for tasks assessed/performed Overall Cognitive Status: Within Functional Limits for tasks assessed  General Comments: Follows commands consistently.  Requires some redirection to follow directions.      Exercises Other Exercises Other Exercises: Standing march x20, heel raises x20, tandem walking 4x8 ft with close CGA-1 L LOB during tandem walking which required mod-max A from PT to correct. Other Exercises: Seated toe taps, LAQ's (pt reported this caused decreased pain in L knee) with VC's for form and controlled motion. Other Exercises: STS x5 with VC's for body mechanics and min A to assist in standing.    General Comments         Pertinent Vitals/Pain Pain Assessment: Faces Faces Pain Scale: Hurts little more Pain Location: Pt reported increase in L knee pain following ambulation and standing balance exercises. Pain Intervention(s): Limited activity within patient's tolerance    Home Living                      Prior Function            PT Goals (current goals can now be found in the care plan section) Acute Rehab PT Goals Patient Stated Goal: To return to walking without difficulty and fall risk PT Goal Formulation: With patient Time For Goal Achievement: 04/16/18 Potential to Achieve Goals: Good Progress towards PT goals: Progressing toward goals    Frequency    Min 2X/week      PT Plan Current plan remains appropriate    Co-evaluation              AM-PAC PT "6 Clicks" Daily Activity  Outcome Measure  Difficulty turning over in bed (including adjusting bedclothes, sheets and blankets)?: A Little Difficulty moving from lying on back to sitting on the side of the bed? : A Little Difficulty sitting down on and standing up from a chair with arms (e.g., wheelchair, bedside commode, etc,.)?: A Little Help needed moving to and from a bed to chair (including a wheelchair)?: A Little Help needed walking in hospital room?: A Little Help needed climbing 3-5 steps with a railing? : A Little 6 Click Score: 18    End of Session Equipment Utilized During Treatment: Gait belt Activity Tolerance: Patient tolerated treatment well Patient left: Other (comment)(PT walked with pt to activity area.) Nurse Communication: Mobility status PT Visit Diagnosis: Unsteadiness on feet (R26.81);Muscle weakness (generalized) (M62.81);History of falling (Z91.81)     Time: 4496-7591 PT Time Calculation (min) (ACUTE ONLY): 23 min  Charges:  $Therapeutic Exercise: 23-37 mins                    Roxanne Gates, PT, DPT    Roxanne Gates 04/02/2018, 1:19 PM

## 2018-04-02 NOTE — Plan of Care (Signed)
Patient verbalized that she "is doing good".Patient is less intrusive and with less somatic complaints today.Denies SI,HI and AVH.Ambulates with walker.Attended groups.Compliant with medications.Appetite and energy level good.Support and energy level good.

## 2018-04-02 NOTE — [facility] Group Notes (Signed)
[facility] Group Notes:  (Nursing/MHT/Case Management/Adjunct)  Date:  04/02/2018  Time:  1:57 AM  Type of Therapy:  Group Therapy  Participation Level:  Did Not Attend  Tykia Mellone 04/02/2018, 1:57 AM 

## 2018-04-02 NOTE — [facility] Group Notes (Signed)
LCSW Group Therapy Note  04/02/2018 1:15pm  Type of Therapy and Topic:  Group Therapy:  Feelings around Relapse and Recovery  Participation Level:  Active   Description of Group:    Patients in this group will discuss emotions they experience before and after a relapse. They will process how experiencing these feelings, or avoidance of experiencing them, relates to having a relapse. Facilitator will guide patients to explore emotions they have related to recovery. Patients will be encouraged to process which emotions are more powerful. They will be guided to discuss the emotional reaction significant others in their lives may have to their relapse or recovery. Patients will be assisted in exploring ways to respond to the emotions of others without this contributing to a relapse.  Therapeutic Goals: 1. Patient will identify two or more emotions that lead to a relapse for them 2. Patient will identify two emotions that result when they relapse 3. Patient will identify two emotions related to recovery 4. Patient will demonstrate ability to communicate their needs through discussion and/or role plays   Summary of Patient Progress: Lile attempted to actively engage in today's group discussion on feelings around relapse and recovery.  Kialee was unable to identify specific emotions that she experiences around relapse and recovery.  She shared that to her relapse means "going backward" and "having a nervous breakdown".  At times Kelseigh engaged in nonsensical communication.  She shared that relapse can "take a while to get over" and that she tries to take her medications to help her to not relapse, but it doesn't always work.   Therapeutic Modalities:   Cognitive Behavioral Therapy Solution-Focused Therapy Assertiveness Training Relapse Prevention Therapy   Devona Konig, LCSW 04/02/2018 1:39 PM

## 2018-04-03 LAB — BASIC METABOLIC PANEL
ANION GAP: 12 (ref 5–15)
BUN: 39 mg/dL — ABNORMAL HIGH (ref 8–23)
CALCIUM: 9.3 mg/dL (ref 8.9–10.3)
CO2: 23 mmol/L (ref 22–32)
Chloride: 93 mmol/L — ABNORMAL LOW (ref 98–111)
Creatinine, Ser: 1.59 mg/dL — ABNORMAL HIGH (ref 0.44–1.00)
GFR calc Af Amer: 38 mL/min — ABNORMAL LOW (ref >60)
GFR, EST NON AFRICAN AMERICAN: 33 mL/min — AB (ref >60)
GLUCOSE: 140 mg/dL — AB (ref 70–99)
POTASSIUM: 4.9 mmol/L (ref 3.5–5.1)
Sodium: 128 mmol/L — ABNORMAL LOW (ref 135–145)

## 2018-04-03 LAB — CBC
HCT: 33.1 % — ABNORMAL LOW (ref 35.0–47.0)
Hemoglobin: 12 g/dL (ref 12.0–16.0)
MCH: 34.4 pg — ABNORMAL HIGH (ref 26.0–34.0)
MCHC: 36.3 g/dL — AB (ref 32.0–36.0)
MCV: 94.8 fL (ref 80.0–100.0)
Platelets: 152 10*3/uL (ref 150–440)
RBC: 3.5 MIL/uL — AB (ref 3.80–5.20)
RDW: 14.5 % (ref 11.5–14.5)
WBC: 7.5 10*3/uL (ref 3.6–11.0)

## 2018-04-03 LAB — CARBAMAZEPINE LEVEL, TOTAL: CARBAMAZEPINE LVL: 6.5 ug/mL (ref 4.0–12.0)

## 2018-04-03 NOTE — Progress Notes (Signed)
Patient is alert and oriented x 4, denies pain and discomfort, no distress noted, affect is flat, denies S[/HI/AVH but noted responding to internal stimuli. Patient's thoughts are organized and coherent, interacting appropriately with peers and staff and she attended evening wrap up group.15 minutes safety checks maintained will continue to monitor.

## 2018-04-03 NOTE — Progress Notes (Signed)
[facility] MD Progress Note  04/03/2018 1:49 PM Gina [name]  MRN:  [id number]  Subjective:   [name] is a 67 year old female with ahistory of bipolar disorder and multiple hospitalizations. She was maintained, with some success, on a combination of Invega sustenna injections, Seroquel and Depakote until her Depakote had to be discontinued for thrombocytopenia. In spite of slow Depakote taper, increase in Seroquel dose and addition of Lamictal, the patient became manic and had to be hospitalized. Her platelets normalized but the patient remains manic. She is now on Tegretol 200 mg BID and Geodon 80 mg BID and Seroquel 300 mg for sleep. Her kidneys will not tolerate Lithium anymore.  Today, [name] focused on her back pain. She said that she fell a couple of days again, and now her back hurts. Initially, she wanted the writer to order an "MRI or something" to make sure she is ok, because "my stomach hurts and bloated, and I can't eat".  She is explained that her discomfort is more likely from constipation than from the fall. She is redirectable.  She is reassured that she is on a very good bowel regimen already.   She also complains about "dizziness", but her VS are all WNL. She is instructed to hydrate and get up slowly.   Denied SI or HI or AVH.    Principal Problem: Bipolar I disorder, current or most recent episode manic, with psychotic features ([place]) Diagnosis:   Patient Active Problem List   Diagnosis Date Noted  . Altered mental status, unspecified [R41.82] 03/16/2018  . Hypotension [I95.9] 03/15/2018  . Rash of hands [R21] 02/18/2018  . Pain of right heel [M79.671] 12/30/2017  . Encounter for chronic pain management [G89.29] 12/18/2017  . Foot pain, bilateral [id number], M79.672] 11/25/2017  . Bipolar I disorder, current or most recent episode manic, with psychotic features ([place]) [F31.2] 11/04/2017  . Gallbladder polyp [K82.4] 10/21/2017  . Left shoulder pain [M25.512]  10/21/2017  . Benign neoplasm of ascending colon [D12.2]   . Low back pain radiating to left lower extremity [M54.5, M79.605] 02/26/2017  . Acquired hypothyroidism [E03.9] 09/10/2016  . Thrombocytopenia ([place]) [D69.6] 06/15/2016  . Medicare annual wellness visit, subsequent [Z00.00] 06/04/2016  . Advanced care planning/counseling discussion [Z71.89] 06/04/2016  . External hemorrhoid [K64.4] 04/02/2016  . Slow transit constipation [K59.01] 01/04/2016  . Bipolar I disorder, most recent episode (or current) manic ([place]) [F31.10] 12/03/2015  . NAFLD (nonalcoholic fatty liver disease) [K76.0] 03/28/2015  . Gallbladder calculus without cholecystitis [K80.20] 03/28/2015  . Gout [M10.9] 03/27/2015  . HCV antibody positive [R76.8] 03/27/2015  . History of bladder cancer [Z85.51] 03/27/2015  . History of bundle branch block [Z86.79] 03/27/2015  . Genetic testing [Z13.79] 02/28/2015  . Loss of memory [R41.3] 09/04/2014  . Controlled type 2 diabetes mellitus with diabetic nephropathy ([place]) [E11.21] 06/07/2014  . Hyperlipidemia [E78.5]   . Anemia in CKD (chronic kidney disease) [N18.9, D63.1] 01/26/2013  . Personal history of colonic adenomas and colon cancer [Z86.010] 11/11/2012  . Osteoarthritis of knee [M17.10] 10/02/2012  . OSA on CPAP [G47.33, Z99.89]   . CKD (chronic kidney disease) stage 4, GFR 15-29 ml/min (HCC) [N18.4]   . Severe obesity (BMI 35.0-39.9) with comorbidity ([place]) [E66.01]   . HTN (hypertension), benign [I10] 04/07/2012  . Hx of colon cancer, stage I [Z85.038] 08/05/2011   Total Time spent with patient: 20 minutes  Past Psychiatric History: bipolar disorder  Past Medical History:  Past Medical History:  Diagnosis Date  . Anemia in  chronic kidney disease   . Bipolar depression [facility])    sees psychiatrist - psych admission 03/2015  . Bladder cancer ([place]) 1990's  . Blood transfusion without reported diagnosis 2009  . Cataract    left  . CKD (chronic kidney disease) stage  3, GFR 30-59 ml/min (HCC)   . Colon cancer ([place]) 1990's  . DJD (degenerative joint disease), lumbar    chronic lower back pain  . Enterocutaneous fistula 04/07/2012   completed PT 06/2012 ([facility])  . Family history of breast cancer   . Family history of colon cancer   . Family history of stomach cancer   . GERD (gastroesophageal reflux disease)   . History of bladder cancer 1997  . History of colon cancer    s/p surgery  . History of uterine cancer    s/p hysterectomy  . Hyperlipidemia   . Hypertension   . IDA (iron deficiency anemia) 01/2013   thought due to h/o polyps  . Obesity (BMI 30-39.9)   . OSA on CPAP    6cm H2O  . Personal history of colonic adenomas and colon cancer 11/11/2012  . Positive hepatitis C antibody test 09/2014   but negative confirmatory testing  . RBBB   . Uncontrolled type 2 diabetes mellitus with nephropathy ([place]) 06/07/2014   completed DSME 02/2016    Past Surgical History:  Procedure Laterality Date  . BREAST BIOPSY Right 01/2014   fibroadenoma  . COLONOSCOPY  07/2009  . COLONOSCOPY  11/2012   2 tubular adenomas, mild diverticulosis, pending genetic testing for Lynch syndrome [name]) rpt 2 yrs  . COLONOSCOPY  02/2015   TA, diverticulosis, rpt 2 yrs [name])  . COLONOSCOPY WITH PROPOFOL N/A 06/09/2017   TAx1, rpt 2 yrs [name], [name], MD)  . DEXA  12/2009   WNL  . DOBUTAMINE STRESS ECHO  12/2009   no evidence of ischemia  . HERNIA REPAIR  02/04/12  . i & d abdominal wound  02/19/12  . LEFT OOPHORECTOMY  2005  . PARTIAL COLECTOMY  about 2008   for colon cancer  . PARTIAL HYSTERECTOMY  1981   uterine cancer, R ovary remains  . Reexploration of abdominal wound and Allograft placemet  02/26/12  . Removal of infected mesh and abdominal wound vac placement  02/24/12  . sleep study  02/2014   OSA - AHI 55, nadir 81% [name])   Family History:  Family History  Problem Relation Age of Onset  . Colon cancer Mother 53  . Stomach cancer Mother        dx  in her 28s?  . CAD Father        MI; deceased 64  . Mental illness Sister        anxiety/depression  . Thyroid disease Sister   . Breast cancer Maternal Grandmother        age at dx unknown  . Diabetes Brother   . Diabetes Brother   . Diabetes Other        aunts and uncles both sides  . Arthritis Other        strong fmhx  . Colon cancer Other 11       maternal half-sister; deceased   Family Psychiatric  History: none Social History:  Social History   Substance and Sexual Activity  Alcohol Use No  . Alcohol/week: 0.0 standard drinks     Social History   Substance and Sexual Activity  Drug Use No    Social History   Socioeconomic  History  . Marital status: Single    Spouse name: Not on file  . Number of children: 0  . Years of education: Not on file  . Highest education level: Not on file  Occupational History  . Not on file  Social Needs  . Financial resource strain: Not on file  . Food insecurity:    Worry: Not on file    Inability: Not on file  . Transportation needs:    Medical: Not on file    Non-medical: Not on file  Tobacco Use  . Smoking status: Never Smoker  . Smokeless tobacco: Never Used  Substance and Sexual Activity  . Alcohol use: No    Alcohol/week: 0.0 standard drinks  . Drug use: No  . Sexual activity: Never  Lifestyle  . Physical activity:    Days per week: Not on file    Minutes per session: Not on file  . Stress: Not on file  Relationships  . Social connections:    Talks on phone: Not on file    Gets together: Not on file    Attends religious service: Not on file    Active member of club or organization: Not on file    Attends meetings of clubs or organizations: Not on file    Relationship status: Not on file  Other Topics Concern  . Not on file  Social History Narrative   Lives with sister, no pets   Occupation: disabled, for bipolar and arthritis   Edu: GED   Activity: take walks   Diet: good water, vegetables daily    Religion: [place], Dr. Jimmye Norman (ph [phone])   Additional Social History:   Sleep: Poor  Appetite:  Fair  Current Medications: Current Facility-Administered Medications  Medication Dose Route Frequency Provider Last Rate Last Dose  . acetaminophen (TYLENOL) tablet 650 mg  650 mg Oral Q6H PRN Pucilowska, Jolanta B, MD   650 mg at 03/31/18 1558  . alum & mag hydroxide-simeth (MAALOX/MYLANTA) 200-200-20 MG/5ML suspension 30 mL  30 mL Oral Q4H PRN Pucilowska, Jolanta B, MD   30 mL at 03/20/18 1009  . bisacodyl (DULCOLAX) EC tablet 5 mg  5 mg Oral Daily PRN Pucilowska, Jolanta B, MD   5 mg at 03/18/18 0333  . carbamazepine (TEGRETOL) tablet 200 mg  200 mg Oral BID AC & HS Pucilowska, Jolanta B, MD   200 mg at 04/03/18 0735  . diclofenac (VOLTAREN) EC tablet 75 mg  75 mg Oral BID Pucilowska, Jolanta B, MD   75 mg at 04/03/18 0736  . docusate sodium (COLACE) capsule 100 mg  100 mg Oral BID Pucilowska, Jolanta B, MD   100 mg at 04/03/18 0735  . hydrocortisone (ANUSOL-HC) 2.5 % rectal cream   Rectal QID Pucilowska, Jolanta B, MD      . levothyroxine (SYNTHROID, LEVOTHROID) tablet 50 mcg  50 mcg Oral QAC breakfast Pucilowska, Jolanta B, MD   50 mcg at 04/03/18 0735  . linaclotide (LINZESS) capsule 290 mcg  290 mcg Oral QAC breakfast Pucilowska, Jolanta B, MD   290 mcg at 04/03/18 0736  . linagliptin (TRADJENTA) tablet 5 mg  5 mg Oral Daily Pucilowska, Jolanta B, MD   5 mg at 04/03/18 0736  . magnesium hydroxide (MILK OF MAGNESIA) suspension 30 mL  30 mL Oral Daily PRN Pucilowska, Jolanta B, MD   30 mL at 03/18/18 0333  . metoprolol tartrate (LOPRESSOR) tablet 25 mg  25 mg Oral BID Pucilowska, Jolanta B, MD   25 mg at 04/03/18 0735  . polyethylene glycol (MIRALAX / GLYCOLAX) packet 17 g  17 g Oral Daily Pucilowska, Jolanta B, MD   17 g at 04/03/18 0735  . QUEtiapine (SEROQUEL) tablet 400 mg  400 mg Oral QHS Pucilowska, Jolanta B, MD   400 mg at 04/02/18 2152   . simethicone (MYLICON) chewable tablet 80 mg  80 mg Oral QID Pucilowska, Jolanta B, MD   80 mg at 04/03/18 1142  . simvastatin (ZOCOR) tablet 20 mg  20 mg Oral q1800 Pucilowska, Jolanta B, MD   20 mg at 04/02/18 1727  . ziprasidone (GEODON) capsule 80 mg  80 mg Oral BID WC Pucilowska, Jolanta B, MD   80 mg at 04/03/18 0735    Lab Results:  Results for orders placed or performed during the hospital encounter of 03/16/18 (from the past 48 hour(s))  Basic metabolic panel     Status: Abnormal   Collection Time: 04/03/18  7:08 AM  Result Value Ref Range   Sodium 128 (L) 135 - 145 mmol/L   Potassium 4.9 3.5 - 5.1 mmol/L   Chloride 93 (L) 98 - 111 mmol/L   CO2 23 22 - 32 mmol/L   Glucose, Bld 140 (H) 70 - 99 mg/dL   BUN 39 (H) 8 - 23 mg/dL   Creatinine, Ser 1.59 (H) 0.44 - 1.00 mg/dL   Calcium 9.3 8.9 - 10.3 mg/dL   GFR calc non Af Amer 33 (L) >60 mL/min   GFR calc Af Amer 38 (L) >60 mL/min    Comment: (NOTE) The eGFR has been calculated using the CKD EPI equation. This calculation has not been validated in all clinical situations. eGFR's persistently <60 mL/min signify possible Chronic Kidney Disease.    Anion gap 12 5 - 15    Comment: Performed at [facility], [address] [place], [place] [address]  Carbamazepine level, total     Status: None   Collection Time: 04/03/18  7:08 AM  Result Value Ref Range   Carbamazepine Lvl 6.5 4.0 - 12.0 ug/mL    Comment: Performed at [facility], [address] [place], [place] [address]  CBC     Status: Abnormal   Collection Time: 04/03/18  7:08 AM  Result Value Ref Range   WBC 7.5 3.6 - 11.0 K/uL   RBC 3.50 (L) 3.80 - 5.20 MIL/uL   Hemoglobin 12.0 12.0 - 16.0 g/dL   HCT 33.1 (L) 35.0 - 47.0 %   MCV 94.8 80.0 - 100.0 fL   MCH 34.4 (H) 26.0 - 34.0 pg   MCHC 36.3 (H) 32.0 - 36.0 g/dL   RDW 14.5 11.5 - 14.5 %   Platelets 152 150 - 440 K/uL    Comment: Performed at [facility], [address]  [place], [place] [address]    Blood Alcohol level:  Lab Results  Component Value Date   [facility] <10 03/11/2018   ETH <10 50/27/7412    Metabolic Disorder Labs: Lab Results  Component Value Date   HGBA1C 6.6 (H) 02/18/2018   MPG 122.63 11/14/2017   MPG 114 04/14/2012   Lab Results  Component Value Date   PROLACTIN 33.6 (H) 12/04/2015   Lab Results  Component Value Date   CHOL 140 11/14/2017   TRIG 152 (H) 11/14/2017   HDL 50 11/14/2017   CHOLHDL 2.8 11/14/2017   VLDL 30 11/14/2017   LDLCALC 60 11/14/2017  [place] 81 09/30/2017    Musculoskeletal: Strength & Muscle Tone: within normal limits Gait & Station: normal Patient leans: N/A  Psychiatric Specialty Exam: Physical Exam  Nursing note and vitals reviewed. Psychiatric: Her speech is normal. Her affect is labile and inappropriate. She is hyperactive. Thought content is paranoid and delusional. Cognition and memory are impaired. She expresses impulsivity.    Review of Systems  Gastrointestinal: Positive for constipation.  Musculoskeletal: Positive for back pain and joint pain.  Neurological: Positive for headaches.  Psychiatric/Behavioral: Positive for hallucinations.  All other systems reviewed and are negative.   Blood pressure 139/88, pulse 65, temperature 97.9 F (36.6 C), temperature source Oral, resp. rate 18, height 5' 2"  (1.575 m), weight 87.1 kg, SpO2 100 %.Body mass index is 35.12 kg/m.  General Appearance: Casual  Eye Contact:  Good  Speech:  Garbled and Pressured  Volume:  Normal  Mood:  Anxious  Affect:  Congruent  Thought Process:  Disorganized, Irrelevant and Descriptions of Associations: Circumstantial  Orientation:  Full (Time, Place, and Person)  Thought Content:  Illogical and Delusions  Suicidal Thoughts:  No  Homicidal Thoughts:  No  Memory:  Immediate;   Poor Recent;   Poor Remote;   Poor  Judgement:  Impaired  Insight:  Shallow  Psychomotor Activity:  Increased  Concentration:   Concentration: Fair and Attention Span: Poor  Recall:  Poor  Fund of Knowledge:  Poor  Language:  Poor  Akathisia:  No  Handed:  Right  AIMS (if indicated):     Assets:  Communication Skills Desire for Improvement Financial Resources/Insurance Housing Resilience Social Support  ADL's:  Intact  Cognition:  WNL  Sleep:  Number of Hours: 6     Treatment Plan Summary: Daily contact with patient to assess and evaluate symptoms and progress in treatment and Medication management   Ms. Hovsepian is a 67 year old female with a history of bipolar disorder admitted for a manic episode in the context of medication changes.She had severe insomnia and was given 30 mg of Restoril tonight causinghypotonia and was briefly hospitalized at ICU. She is still manic. She tolerates transition from Depakote to Lamictal well butmanic symptoms are on the rise.  #Bipolar disorder, mania -we discontinued Invega sustenna injections as this is of doubtful benefit -continue Seroquel 400 mg nightly  -continueTegretol 200 mg BID, level is 6.5 this morning. (Tegretol can cause or worse thrombocytopenia as well, will monitor).  -continueGeodon to 80 mg BID  #Thrombocytopenia, resolved - Depakotewas discontinued - CBC: platelets is 152.   #Fall risk -PT input is appreciated  #Arthritis -continueVoltaren 75 mg BID  #Sleep apnea -CPAP nightly  #Hypothyroidism -Continue Synthroid 50 mcg daily  #HTN, BP elevated -continue Metoprolol 25 mg BID, BP is 139/88 today.  -discontinue Losartan due to hyperkaliemia, K level in AM   #DM -Tradjenta 5 mg daily -ADA diet  #Constipation -bowel regimen -Anusol PRN -add Linzess  #Dislipidemia  -Zocor 20 mg daily  #Labs -Hemoglobin A1c was 6.6 and total cholesterol was 140  #Fall -head CT negative -PT consult is appreciated -patient has a new walker  #Disposition -discharge to home with the sister -sister will briefly visit on  Saturday at noon -follow up with [facility]  Arretta Toenjes, MD 04/03/2018, 1:49 PM

## 2018-04-03 NOTE — Progress Notes (Signed)
D- Patient alert and oriented. Patient presents in a pleasant mood on assessment stating that she has a headache and feels like she is getting a virus, "is there a virus going around". Patient requested pain medication from this writer and stated that she was going to lay down and get some more rest. Patient denies SI, HI, AVH, at this time. When this writer asked patient about any signs/symptoms of depression or anxiety, patient stated "no". Patient's goal for today is to "get well".  A- Scheduled medications administered to patient, per MD orders. Support and encouragement provided.  Routine safety checks conducted every 15 minutes.  Patient informed to notify staff with problems or concerns.  R- No adverse drug reactions noted. Patient contracts for safety at this time. Patient compliant with medications and treatment plan. Patient receptive, calm, and cooperative. Patient interacts well with others on the unit.  Patient remains safe at this time.

## 2018-04-03 NOTE — Plan of Care (Signed)
Patient  able to verbalize understanding of information received. Emotional and mental status improved . Voice no concerns around sleep.  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 No anger or frustration noted  self control noted  .   Continue to use walker no safety concerns  at present time although  patient is a high fall risk. Attending  unit activities . Voice no concerns around wake and sleep cycle .  Denies suicidal  ideations .   Problem: Education: Goal: Knowledge of [place] [facility] Education information/materials will improve Outcome: Progressing Goal: Emotional status will improve Outcome: Progressing Goal: Mental status will improve Outcome: Progressing Goal: Verbalization of understanding the information provided will improve Outcome: Progressing   Problem: Activity: Goal: Sleeping patterns will improve Outcome: Progressing   Problem: Coping: Goal: Ability to verbalize frustrations and anger appropriately will improve Outcome: Progressing Goal: Ability to demonstrate self-control will improve Outcome: Progressing   Problem: Health Behavior/Discharge Planning: Goal: Compliance with treatment plan for underlying cause of condition will improve Outcome: Progressing   Problem: Safety: Goal: Periods of time without injury will increase Outcome: Progressing   Problem: Activity: Goal: Interest or engagement in leisure activities will improve Outcome: Progressing Goal: Imbalance in normal sleep/wake cycle will improve Outcome: Progressing   Problem: Health Behavior/Discharge Planning: Goal: Ability to make decisions will improve Outcome: Progressing Goal: Compliance with therapeutic regimen will improve Outcome: Progressing   Problem: Safety: Goal: Ability to disclose and discuss suicidal ideas will improve Outcome: Progressing Goal: Ability to identify and utilize support systems that promote safety will improve Outcome: Progressing

## 2018-04-03 NOTE — [facility] Group Notes (Signed)
LCSW Group Therapy Note  04/03/2018 1:15pm  Type of Therapy and Topic:  Group Therapy:  Cognitive Distortions  Participation Level:  Did Not Attend   Description of Group:    Patients in this group will be introduced to the topic of cognitive distortions.  Patients will identify and describe cognitive distortions, describe the feelings these distortions create for them.  Patients will identify one or more situations in their personal life where they have cognitively distorted thinking and will verbalize challenging this cognitive distortion through positive thinking skills.  Patients will practice the skill of using positive affirmations to challenge cognitive distortions using affirmation cards.    Therapeutic Goals:  1. Patient will identify two or more cognitive distortions they have used 2. Patient will identify one or more emotions that stem from use of a cognitive distortion 3. Patient will demonstrate use of a positive affirmation to counter a cognitive distortion through discussion and/or role play. 4. Patient will describe one way cognitive distortions can be detrimental to wellness   Summary of Patient Progress: Pt was invited to attend group but chose not to attend. CSW will continue to encourage pt to attend group throughout their admission.      Therapeutic Modalities:   Cognitive Behavioral Therapy Motivational Interviewing   Elan Brainerd  CUEBAS-COLON, LCSW 04/03/2018 12:47 PM

## 2018-04-03 NOTE — Progress Notes (Signed)
MD notified of patient's elevated blood pressure.

## 2018-04-03 NOTE — Progress Notes (Signed)
Patient continues to state that she isn't feeling well and would like to rest. Patient has been requesting fluids and have accepted them from this writer. Patient states "just check on me every once in a while, would you".

## 2018-04-04 ENCOUNTER — Inpatient Hospital Stay: Payer: Medicare Other

## 2018-04-04 MED ORDER — ONDANSETRON 4 MG PO TBDP
4.0000 mg | ORAL_TABLET | Freq: Three times a day (TID) | ORAL | Status: DC | PRN
  Administered 2018-04-04: 4 mg via ORAL
  Filled 2018-04-04: qty 1

## 2018-04-04 NOTE — Plan of Care (Signed)
  Problem: Coping: Goal: Ability to verbalize frustrations and anger appropriately will improve Outcome: Progressing

## 2018-04-04 NOTE — Progress Notes (Signed)
Patient is alert and oriented x 4 denies pain or discomfort , complained of dizziness B/p checked WNL, medicated at bedside, no further distress noted. Patient's  thoughts are organized and coherent, speech is non tangential, affect is flat but brightens upon approach. 15 minutes safety checks maintained will continue to monitor closely.

## 2018-04-04 NOTE — Plan of Care (Signed)
.   Emotional and mental status improved . Voice no concerns around sleep.  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 No anger or frustration noted  self control noted  .   Continue to use walker no safety concerns  at present time although  patient is a high fall risk. Attending  unit activities . Voice no concerns around wake and sleep cycle .  Denies suicidal  ideations . Patient  able to verbalize understanding of information received.   Problem: Education: Goal: Knowledge of [place] [facility] Education information/materials will improve Outcome: Progressing Goal: Emotional status will improve Outcome: Progressing Goal: Mental status will improve Outcome: Progressing Goal: Verbalization of understanding the information provided will improve Outcome: Progressing   Problem: Activity: Goal: Interest or engagement in activities will improve Outcome: Progressing Goal: Sleeping patterns will improve Outcome: Progressing   Problem: Coping: Goal: Ability to verbalize frustrations and anger appropriately will improve Outcome: Progressing Goal: Ability to demonstrate self-control will improve Outcome: Progressing   Problem: Health Behavior/Discharge Planning: Goal: Compliance with treatment plan for underlying cause of condition will improve Outcome: Progressing   Problem: Safety: Goal: Periods of time without injury will increase Outcome: Progressing   Problem: Activity: Goal: Interest or engagement in leisure activities will improve Outcome: Progressing Goal: Imbalance in normal sleep/wake cycle will improve Outcome: Progressing   Problem: Health Behavior/Discharge Planning: Goal: Ability to make decisions will improve Outcome: Progressing Goal: Compliance with therapeutic regimen will improve Outcome: Progressing   Problem: Safety: Goal: Ability to disclose and discuss suicidal ideas will improve Outcome: Progressing Goal: Ability to identify and utilize support systems that  promote safety will improve Outcome: Progressing   Problem: Safety: Goal: Ability to remain free from injury will improve Outcome: Progressing

## 2018-04-04 NOTE — Progress Notes (Signed)
D: Patient stated slept poor  night .Stated appetite poor and energy level  Low . Stated concentration poor . Stated on Depression scale 0, hopeless 0 and anxiety 0 .( low 0-10 high) Denies suicidal  homicidal ideations  .  No auditory hallucinations  No pain concerns . Appropriate ADL'S. Interacting with peers and staff. Emotional and mental status improved . Voice no concerns around sleep. Continue to work on Radiographer, therapeutic . No anger or frustration noted self control noted . Continue to use walker no safety concerns at present time although patient is a high fall risk. Attending unit activities . Voice no concerns around wake and sleep cycle . Denies suicidal ideations . Patient able to verbalize understanding of information received. Patient has been  Spitting into a bag throughout  Shift . Noted to spit onto floor  Voice of abdominal pain .  Writer has not witness patient vomite  Only spit .  No breakfast  Out of room for lunch . Patient ate entire tray . Patient escorted to Xray for picture of abdomen .   A: Encourage patient participation with unit programming . Instruction  Given on  Medication , verbalize understanding.  Patient aware of results of xray  R: Voice no other concerns. Staff continue to [facility]

## 2018-04-04 NOTE — [facility] Group Notes (Signed)
LCSW Group Therapy Note 04/04/2018 1:15pm  Type of Therapy and Topic: Group Therapy: Feelings Around Returning Home & Establishing a Supportive Framework and Supporting Oneself When Supports Not Available  Participation Level: Did Not Attend  Description of Group:  Patients first processed thoughts and feelings about upcoming discharge. These included fears of upcoming changes, lack of change, new living environments, judgements and expectations from others and overall stigma of mental health issues. The group then discussed the definition of a supportive framework, what that looks and feels like, and how do to discern it from an unhealthy non-supportive network. The group identified different types of supports as well as what to do when your family/friends are less than helpful or unavailable  Therapeutic Goals  1. Patient will identify one healthy supportive network that they can use at discharge. 2. Patient will identify one factor of a supportive framework and how to tell it from an unhealthy network. 3. Patient able to identify one coping skill to use when they do not have positive supports from others. 4. Patient will demonstrate ability to communicate their needs through discussion and/or role plays.  Summary of Patient Progress:  Pt was invited to attend group but chose not to attend. CSW will continue to encourage pt to attend group throughout their admission.    Therapeutic Modalities Cognitive Behavioral Therapy Motivational Interviewing   Ausha Sieh  CUEBAS-COLON, LCSW 04/04/2018 2:35 PM

## 2018-04-04 NOTE — Progress Notes (Signed)
[facility] MD Progress Note  04/04/2018 12:56 PM Gina [name]  MRN:  [id number]  Subjective:  Gina [name] is a 67 year old female with ahistory of bipolar disorder and multiple hospitalizations. She was maintained, with some success, on a combination of Invega sustenna injections, Seroquel and Depakote until her Depakote had to be discontinued for thrombocytopenia. In spite of slow Depakote taper, increase in Seroquel dose and addition of Lamictal, the patient became manic and had to be hospitalized. Her platelets normalized but the patient remains manic. She is now on Tegretol 200 mg BID and Geodon 80 mg BID and Seroquel 300 mg for sleep. Her kidneys will not tolerate Lithium anymore.  Today, Gina [name] focused on abd pain, and stated she has been nauseated and throwing up. However, RN reported only very small amount of vomit in the trash can, it is more like "spit".  Pt also complains about her hernia, insisted that it has been getting bigger.  She is reassured that we will check it out.   Denied SI or HI or AVH.   Spoke with hospitalist and recommend Abd x-ray, and anit-emetics if negative.  RN noticed that pt was eating normally later.  X-ray was negative.    Principal Problem: Bipolar I disorder, current or most recent episode manic, with psychotic features ([place]) Diagnosis:   Patient Active Problem List   Diagnosis Date Noted  . Altered mental status, unspecified [R41.82] 03/16/2018  . Hypotension [I95.9] 03/15/2018  . Rash of hands [R21] 02/18/2018  . Pain of right heel [M79.671] 12/30/2017  . Encounter for chronic pain management [G89.29] 12/18/2017  . Foot pain, bilateral [id number], M79.672] 11/25/2017  . Bipolar I disorder, current or most recent episode manic, with psychotic features ([place]) [F31.2] 11/04/2017  . Gallbladder polyp [K82.4] 10/21/2017  . Left shoulder pain [M25.512] 10/21/2017  . Benign neoplasm of ascending colon [D12.2]   . Low back pain radiating to left  lower extremity [M54.5, M79.605] 02/26/2017  . Acquired hypothyroidism [E03.9] 09/10/2016  . Thrombocytopenia ([place]) [D69.6] 06/15/2016  . Medicare annual wellness visit, subsequent [Z00.00] 06/04/2016  . Advanced care planning/counseling discussion [Z71.89] 06/04/2016  . External hemorrhoid [K64.4] 04/02/2016  . Slow transit constipation [K59.01] 01/04/2016  . Bipolar I disorder, most recent episode (or current) manic ([place]) [F31.10] 12/03/2015  . NAFLD (nonalcoholic fatty liver disease) [K76.0] 03/28/2015  . Gallbladder calculus without cholecystitis [K80.20] 03/28/2015  . Gout [M10.9] 03/27/2015  . HCV antibody positive [R76.8] 03/27/2015  . History of bladder cancer [Z85.51] 03/27/2015  . History of bundle branch block [Z86.79] 03/27/2015  . Genetic testing [Z13.79] 02/28/2015  . Loss of memory [R41.3] 09/04/2014  . Controlled type 2 diabetes mellitus with diabetic nephropathy ([place]) [E11.21] 06/07/2014  . Hyperlipidemia [E78.5]   . Anemia in CKD (chronic kidney disease) [N18.9, D63.1] 01/26/2013  . Personal history of colonic adenomas and colon cancer [Z86.010] 11/11/2012  . Osteoarthritis of knee [M17.10] 10/02/2012  . OSA on CPAP [G47.33, Z99.89]   . CKD (chronic kidney disease) stage 4, GFR 15-29 ml/min (HCC) [N18.4]   . Severe obesity (BMI 35.0-39.9) with comorbidity ([place]) [E66.01]   . HTN (hypertension), benign [I10] 04/07/2012  . Hx of colon cancer, stage I [Z85.038] 08/05/2011   Total Time spent with patient: 20 minutes  Past Psychiatric History: bipolar disorder  Past Medical History:  Past Medical History:  Diagnosis Date  . Anemia in chronic kidney disease   . Bipolar depression [facility])    sees psychiatrist - psych admission 03/2015  . Bladder cancer (  [place]) 1990's  . Blood transfusion without reported diagnosis 2009  . Cataract    left  . CKD (chronic kidney disease) stage 3, GFR 30-59 ml/min (HCC)   . Colon cancer ([place]) 1990's  . DJD (degenerative joint disease),  lumbar    chronic lower back pain  . Enterocutaneous fistula 04/07/2012   completed PT 06/2012 ([facility])  . Family history of breast cancer   . Family history of colon cancer   . Family history of stomach cancer   . GERD (gastroesophageal reflux disease)   . History of bladder cancer 1997  . History of colon cancer    s/p surgery  . History of uterine cancer    s/p hysterectomy  . Hyperlipidemia   . Hypertension   . IDA (iron deficiency anemia) 01/2013   thought due to h/o polyps  . Obesity (BMI 30-39.9)   . OSA on CPAP    6cm H2O  . Personal history of colonic adenomas and colon cancer 11/11/2012  . Positive hepatitis C antibody test 09/2014   but negative confirmatory testing  . RBBB   . Uncontrolled type 2 diabetes mellitus with nephropathy ([place]) 06/07/2014   completed DSME 02/2016    Past Surgical History:  Procedure Laterality Date  . BREAST BIOPSY Right 01/2014   fibroadenoma  . COLONOSCOPY  07/2009  . COLONOSCOPY  11/2012   2 tubular adenomas, mild diverticulosis, pending genetic testing for Lynch syndrome [name]) rpt 2 yrs  . COLONOSCOPY  02/2015   TA, diverticulosis, rpt 2 yrs [name])  . COLONOSCOPY WITH PROPOFOL N/A 06/09/2017   TAx1, rpt 2 yrs [name], [name], MD)  . DEXA  12/2009   WNL  . DOBUTAMINE STRESS ECHO  12/2009   no evidence of ischemia  . HERNIA REPAIR  02/04/12  . i & d abdominal wound  02/19/12  . LEFT OOPHORECTOMY  2005  . PARTIAL COLECTOMY  about 2008   for colon cancer  . PARTIAL HYSTERECTOMY  1981   uterine cancer, R ovary remains  . Reexploration of abdominal wound and Allograft placemet  02/26/12  . Removal of infected mesh and abdominal wound vac placement  02/24/12  . sleep study  02/2014   OSA - AHI 55, nadir 81% [name])   Family History:  Family History  Problem Relation Age of Onset  . Colon cancer Mother 44  . Stomach cancer Mother        dx in her 36s?  . CAD Father        MI; deceased 77  . Mental illness Sister         anxiety/depression  . Thyroid disease Sister   . Breast cancer Maternal Grandmother        age at dx unknown  . Diabetes Brother   . Diabetes Brother   . Diabetes Other        aunts and uncles both sides  . Arthritis Other        strong fmhx  . Colon cancer Other 49       maternal half-sister; deceased   Family Psychiatric  History: none Social History:  Social History   Substance and Sexual Activity  Alcohol Use No  . Alcohol/week: 0.0 standard drinks     Social History   Substance and Sexual Activity  Drug Use No    Social History   Socioeconomic History  . Marital status: Single    Spouse name: Not on file  . Number of children: 0  .  Years of education: Not on file  . Highest education level: Not on file  Occupational History  . Not on file  Social Needs  . Financial resource strain: Not on file  . Food insecurity:    Worry: Not on file    Inability: Not on file  . Transportation needs:    Medical: Not on file    Non-medical: Not on file  Tobacco Use  . Smoking status: Never Smoker  . Smokeless tobacco: Never Used  Substance and Sexual Activity  . Alcohol use: No    Alcohol/week: 0.0 standard drinks  . Drug use: No  . Sexual activity: Never  Lifestyle  . Physical activity:    Days per week: Not on file    Minutes per session: Not on file  . Stress: Not on file  Relationships  . Social connections:    Talks on phone: Not on file    Gets together: Not on file    Attends religious service: Not on file    Active member of club or organization: Not on file    Attends meetings of clubs or organizations: Not on file    Relationship status: Not on file  Other Topics Concern  . Not on file  Social History Narrative   Lives with sister, no pets   Occupation: disabled, for bipolar and arthritis   Edu: GED   Activity: take walks   Diet: good water, vegetables daily   Religion: [employer], Dr. Jimmye Norman (ph [phone])   Additional Social History:   Sleep: Poor  Appetite:  Fair  Current Medications: Current Facility-Administered Medications  Medication Dose Route Frequency Provider Last Rate Last Dose  . acetaminophen (TYLENOL) tablet 650 mg  650 mg Oral Q6H PRN Pucilowska, Jolanta B, MD   650 mg at 04/03/18 2211  . alum & mag hydroxide-simeth (MAALOX/MYLANTA) 200-200-20 MG/5ML suspension 30 mL  30 mL Oral Q4H PRN Pucilowska, Jolanta B, MD   30 mL at 04/04/18 0152  . bisacodyl (DULCOLAX) EC tablet 5 mg  5 mg Oral Daily PRN Pucilowska, Jolanta B, MD   5 mg at 03/18/18 0333  . carbamazepine (TEGRETOL) tablet 200 mg  200 mg Oral BID AC & HS Pucilowska, Jolanta B, MD   200 mg at 04/04/18 0744  . diclofenac (VOLTAREN) EC tablet 75 mg  75 mg Oral BID Pucilowska, Jolanta B, MD   75 mg at 04/04/18 0744  . docusate sodium (COLACE) capsule 100 mg  100 mg Oral BID Pucilowska, Jolanta B, MD   100 mg at 04/04/18 0744  . hydrocortisone (ANUSOL-HC) 2.5 % rectal cream   Rectal QID Pucilowska, Jolanta B, MD      . levothyroxine (SYNTHROID, LEVOTHROID) tablet 50 mcg  50 mcg Oral QAC breakfast Pucilowska, Jolanta B, MD   50 mcg at 04/04/18 0743  . linaclotide (LINZESS) capsule 290 mcg  290 mcg Oral QAC breakfast Pucilowska, Jolanta B, MD   290 mcg at 04/04/18 0744  . linagliptin (TRADJENTA) tablet 5 mg  5 mg Oral Daily Pucilowska, Jolanta B, MD   5 mg at 04/04/18 0744  . magnesium hydroxide (MILK OF MAGNESIA) suspension 30 mL  30 mL Oral Daily PRN Pucilowska, Jolanta B, MD   30 mL at 03/18/18 0333  . metoprolol tartrate (LOPRESSOR) tablet 25 mg  25 mg Oral BID Pucilowska, Jolanta B, MD   25 mg at 04/04/18 0743  . polyethylene glycol (MIRALAX /  GLYCOLAX) packet 17 g  17 g Oral Daily Pucilowska, Jolanta B, MD   17 g at 04/04/18 0743  . QUEtiapine (SEROQUEL) tablet 400 mg  400 mg Oral QHS Pucilowska, Jolanta B, MD   400 mg at 04/03/18 2211  . simethicone (MYLICON) chewable tablet 80 mg  80 mg Oral QID Pucilowska, Jolanta  B, MD   80 mg at 04/04/18 1151  . simvastatin (ZOCOR) tablet 20 mg  20 mg Oral q1800 Pucilowska, Jolanta B, MD   20 mg at 04/03/18 1652  . ziprasidone (GEODON) capsule 80 mg  80 mg Oral BID WC Pucilowska, Jolanta B, MD   80 mg at 04/04/18 0744    Lab Results:  Results for orders placed or performed during the hospital encounter of 03/16/18 (from the past 48 hour(s))  Basic metabolic panel     Status: Abnormal   Collection Time: 04/03/18  7:08 AM  Result Value Ref Range   Sodium 128 (L) 135 - 145 mmol/L   Potassium 4.9 3.5 - 5.1 mmol/L   Chloride 93 (L) 98 - 111 mmol/L   CO2 23 22 - 32 mmol/L   Glucose, Bld 140 (H) 70 - 99 mg/dL   BUN 39 (H) 8 - 23 mg/dL   Creatinine, Ser 1.59 (H) 0.44 - 1.00 mg/dL   Calcium 9.3 8.9 - 10.3 mg/dL   GFR calc non Af Amer 33 (L) >60 mL/min   GFR calc Af Amer 38 (L) >60 mL/min    Comment: (NOTE) The eGFR has been calculated using the CKD EPI equation. This calculation has not been validated in all clinical situations. eGFR's persistently <60 mL/min signify possible Chronic Kidney Disease.    Anion gap 12 5 - 15    Comment: Performed at [facility], [address] [place], [place] [address]  Carbamazepine level, total     Status: None   Collection Time: 04/03/18  7:08 AM  Result Value Ref Range   Carbamazepine Lvl 6.5 4.0 - 12.0 ug/mL    Comment: Performed at [facility], [address] [place], [place] [address]  CBC     Status: Abnormal   Collection Time: 04/03/18  7:08 AM  Result Value Ref Range   WBC 7.5 3.6 - 11.0 K/uL   RBC 3.50 (L) 3.80 - 5.20 MIL/uL   Hemoglobin 12.0 12.0 - 16.0 g/dL   HCT 33.1 (L) 35.0 - 47.0 %   MCV 94.8 80.0 - 100.0 fL   MCH 34.4 (H) 26.0 - 34.0 pg   MCHC 36.3 (H) 32.0 - 36.0 g/dL   RDW 14.5 11.5 - 14.5 %   Platelets 152 150 - 440 K/uL    Comment: Performed at [facility], [address] [place], [place] [address]    Blood Alcohol level:  Lab Results  Component Value Date    [facility] <10 03/11/2018   ETH <10 41/32/4401    Metabolic Disorder Labs: Lab Results  Component Value Date   HGBA1C 6.6 (H) 02/18/2018   MPG 122.63 11/14/2017   MPG 114 04/14/2012   Lab Results  Component Value Date   PROLACTIN 33.6 (H) 12/04/2015   Lab Results  Component Value Date   CHOL 140 11/14/2017   TRIG 152 (H) 11/14/2017   HDL 50 11/14/2017   CHOLHDL 2.8 11/14/2017   VLDL 30 11/14/2017   LDLCALC 60 11/14/2017   LDLCALC 81 09/30/2017    Musculoskeletal: Strength & Muscle Tone: within normal limits Gait & Station: normal Patient leans:  N/A  Psychiatric Specialty Exam: Physical Exam  Nursing note and vitals reviewed. Psychiatric: Her speech is normal. Her affect is labile and inappropriate. She is hyperactive. Thought content is paranoid and delusional. Cognition and memory are impaired. She expresses impulsivity.    Review of Systems  Gastrointestinal: Positive for constipation.  Musculoskeletal: Positive for back pain and joint pain.  Neurological: Positive for headaches.  Psychiatric/Behavioral: Positive for hallucinations.  All other systems reviewed and are negative.   Blood pressure (!) 148/84, pulse 65, temperature 98.8 F (37.1 C), temperature source Oral, resp. rate 18, height _0  (1.575 m), weight 87.1 kg, SpO2 97 %.Body mass index is 35.12 kg/m.  General Appearance: Casual  Eye Contact:  Good  Speech:  Garbled and Pressured  Volume:  Normal  Mood:  Anxious  Affect:  Congruent  Thought Process:  Disorganized, Irrelevant and Descriptions of Associations: Circumstantial  Orientation:  Full (Time, Place, and Person)  Thought Content:  Illogical and Delusions  Suicidal Thoughts:  No  Homicidal Thoughts:  No  Memory:  Immediate;   Poor Recent;   Poor Remote;   Poor  Judgement:  Impaired  Insight:  Shallow  Psychomotor Activity:  Increased  Concentration:  Concentration: Fair and Attention Span: Poor  Recall:  Poor  Fund of Knowledge:  Poor   Language:  Poor  Akathisia:  No  Handed:  Right  AIMS (if indicated):     Assets:  Communication Skills Desire for Improvement Financial Resources/Insurance Housing Resilience Social Support  ADL's:  Intact  Cognition:  WNL  Sleep:  Number of Hours: 6     Treatment Plan Summary: Daily contact with patient to assess and evaluate symptoms and progress in treatment and Medication management   Gina [name] is a 67 year old female with a history of bipolar disorder admitted for a manic episode in the context of medication changes.She had severe insomnia and was given 30 mg of Restoril tonight causinghypotonia and was briefly hospitalized at ICU. She is still manic. She tolerates transition from Depakote to Lamictal well butmanic symptoms are on the rise. Pt also has frequent somatic complaints, back pain, nausea, but negative workup.   #Bipolar disorder, mania -the primary team discontinued Invega sustenna injections due to ineffectivness -continue Seroquel 400 mg nightly  -continueTegretol 200 mg BID, level is 6.5 this morning. (Tegretol can cause or worse thrombocytopenia as well, will monitor).  -continueGeodon to 80 mg BID  #Thrombocytopenia, resolved - Depakotewas discontinued - CBC: platelets is 152 on 8/31  #Fall risk -PT input is appreciated  #Arthritis -continueVoltaren 75 mg BID  #Sleep apnea -CPAP nightly  #Hypothyroidism -Continue Synthroid 50 mcg daily  #HTN, BP elevated -continue Metoprolol 25 mg BID, BP is 139/88 yesterday,  148/84 today -discontinue Losartan due to hyperkaliemia, K level in AM   #DM -Tradjenta 5 mg daily -ADA diet  #Constipation -bowel regimen -Anusol PRN -add Linzess  #Dislipidemia  -Zocor 20 mg daily  #Labs -Hemoglobin A1c was 6.6 and total cholesterol was 140  #Fall -head CT negative -PT consult is appreciated -patient has a new walker  #Abdominal Pain - Abd X-ray, negative.  - will provide  zofran prn.   #Disposition -discharge to home with the sister -sister will briefly visit on Saturday at noon -follow up with [facility]  Ameir Faria, MD 04/04/2018, 12:56 PM

## 2018-04-04 NOTE — Progress Notes (Signed)
Patient complained she felt nauseated but she did not vomit, vs are WNL,no distress noted , staff at bedside will continue to closely monitor.

## 2018-04-05 ENCOUNTER — Inpatient Hospital Stay
Admission: AD | Admit: 2018-04-05 | Discharge: 2018-05-04 | DRG: 296 | Disposition: E | Payer: Medicare Other | Source: Ambulatory Visit | Attending: Family Medicine | Admitting: Family Medicine

## 2018-04-05 DIAGNOSIS — I959 Hypotension, unspecified: Secondary | ICD-10-CM | POA: Diagnosis present

## 2018-04-05 DIAGNOSIS — K219 Gastro-esophageal reflux disease without esophagitis: Secondary | ICD-10-CM | POA: Diagnosis present

## 2018-04-05 DIAGNOSIS — Z888 Allergy status to other drugs, medicaments and biological substances status: Secondary | ICD-10-CM

## 2018-04-05 DIAGNOSIS — G4733 Obstructive sleep apnea (adult) (pediatric): Secondary | ICD-10-CM | POA: Diagnosis present

## 2018-04-05 DIAGNOSIS — Z818 Family history of other mental and behavioral disorders: Secondary | ICD-10-CM

## 2018-04-05 DIAGNOSIS — N179 Acute kidney failure, unspecified: Secondary | ICD-10-CM | POA: Diagnosis present

## 2018-04-05 DIAGNOSIS — R4020 Unspecified coma: Secondary | ICD-10-CM | POA: Diagnosis present

## 2018-04-05 DIAGNOSIS — Z9989 Dependence on other enabling machines and devices: Secondary | ICD-10-CM | POA: Diagnosis not present

## 2018-04-05 DIAGNOSIS — I472 Ventricular tachycardia: Secondary | ICD-10-CM | POA: Diagnosis present

## 2018-04-05 DIAGNOSIS — Z66 Do not resuscitate: Secondary | ICD-10-CM | POA: Diagnosis present

## 2018-04-05 DIAGNOSIS — D631 Anemia in chronic kidney disease: Secondary | ICD-10-CM | POA: Diagnosis present

## 2018-04-05 DIAGNOSIS — Z880 Allergy status to penicillin: Secondary | ICD-10-CM

## 2018-04-05 DIAGNOSIS — I129 Hypertensive chronic kidney disease with stage 1 through stage 4 chronic kidney disease, or unspecified chronic kidney disease: Secondary | ICD-10-CM | POA: Diagnosis present

## 2018-04-05 DIAGNOSIS — Z91041 Radiographic dye allergy status: Secondary | ICD-10-CM

## 2018-04-05 DIAGNOSIS — Z9049 Acquired absence of other specified parts of digestive tract: Secondary | ICD-10-CM | POA: Diagnosis not present

## 2018-04-05 DIAGNOSIS — E1122 Type 2 diabetes mellitus with diabetic chronic kidney disease: Secondary | ICD-10-CM | POA: Diagnosis present

## 2018-04-05 DIAGNOSIS — N183 Chronic kidney disease, stage 3 (moderate): Secondary | ICD-10-CM | POA: Diagnosis present

## 2018-04-05 DIAGNOSIS — Z833 Family history of diabetes mellitus: Secondary | ICD-10-CM

## 2018-04-05 DIAGNOSIS — E1136 Type 2 diabetes mellitus with diabetic cataract: Secondary | ICD-10-CM | POA: Diagnosis present

## 2018-04-05 DIAGNOSIS — I469 Cardiac arrest, cause unspecified: Secondary | ICD-10-CM | POA: Diagnosis present

## 2018-04-05 DIAGNOSIS — Z90711 Acquired absence of uterus with remaining cervical stump: Secondary | ICD-10-CM

## 2018-04-05 DIAGNOSIS — Z515 Encounter for palliative care: Secondary | ICD-10-CM | POA: Diagnosis present

## 2018-04-05 DIAGNOSIS — K922 Gastrointestinal hemorrhage, unspecified: Secondary | ICD-10-CM | POA: Diagnosis present

## 2018-04-05 DIAGNOSIS — M5136 Other intervertebral disc degeneration, lumbar region: Secondary | ICD-10-CM | POA: Diagnosis present

## 2018-04-05 DIAGNOSIS — Z8261 Family history of arthritis: Secondary | ICD-10-CM

## 2018-04-05 DIAGNOSIS — Z8349 Family history of other endocrine, nutritional and metabolic diseases: Secondary | ICD-10-CM

## 2018-04-05 DIAGNOSIS — Z7984 Long term (current) use of oral hypoglycemic drugs: Secondary | ICD-10-CM

## 2018-04-05 DIAGNOSIS — E785 Hyperlipidemia, unspecified: Secondary | ICD-10-CM | POA: Diagnosis present

## 2018-04-05 DIAGNOSIS — Z90721 Acquired absence of ovaries, unilateral: Secondary | ICD-10-CM | POA: Diagnosis not present

## 2018-04-05 DIAGNOSIS — Z8551 Personal history of malignant neoplasm of bladder: Secondary | ICD-10-CM

## 2018-04-05 DIAGNOSIS — F319 Bipolar disorder, unspecified: Secondary | ICD-10-CM | POA: Diagnosis present

## 2018-04-05 DIAGNOSIS — R001 Bradycardia, unspecified: Secondary | ICD-10-CM | POA: Diagnosis present

## 2018-04-05 DIAGNOSIS — Z800 Family history of malignant neoplasm of digestive organs: Secondary | ICD-10-CM

## 2018-04-05 DIAGNOSIS — Z803 Family history of malignant neoplasm of breast: Secondary | ICD-10-CM

## 2018-04-05 DIAGNOSIS — Z887 Allergy status to serum and vaccine status: Secondary | ICD-10-CM

## 2018-04-05 DIAGNOSIS — Z85038 Personal history of other malignant neoplasm of large intestine: Secondary | ICD-10-CM

## 2018-04-05 DIAGNOSIS — Z79899 Other long term (current) drug therapy: Secondary | ICD-10-CM

## 2018-04-05 DIAGNOSIS — Z8249 Family history of ischemic heart disease and other diseases of the circulatory system: Secondary | ICD-10-CM

## 2018-04-05 DIAGNOSIS — Z8542 Personal history of malignant neoplasm of other parts of uterus: Secondary | ICD-10-CM

## 2018-04-05 DIAGNOSIS — Z79890 Hormone replacement therapy (postmenopausal): Secondary | ICD-10-CM

## 2018-04-05 LAB — LACTIC ACID, PLASMA: Lactic Acid, Venous: 13.6 mmol/L (ref 0.5–1.9)

## 2018-04-05 LAB — TYPE AND SCREEN
ABO/RH(D): O POS
Antibody Screen: NEGATIVE

## 2018-04-05 LAB — COMPREHENSIVE METABOLIC PANEL
ALK PHOS: 86 U/L (ref 38–126)
ALT: 31 U/L (ref 0–44)
AST: 43 U/L — AB (ref 15–41)
Albumin: 2.8 g/dL — ABNORMAL LOW (ref 3.5–5.0)
Anion gap: 20 — ABNORMAL HIGH (ref 5–15)
BUN: 50 mg/dL — AB (ref 8–23)
CALCIUM: 13.9 mg/dL — AB (ref 8.9–10.3)
CHLORIDE: 83 mmol/L — AB (ref 98–111)
CO2: 20 mmol/L — AB (ref 22–32)
CREATININE: 2.53 mg/dL — AB (ref 0.44–1.00)
GFR calc Af Amer: 21 mL/min — ABNORMAL LOW (ref >60)
GFR calc non Af Amer: 19 mL/min — ABNORMAL LOW (ref >60)
Glucose, Bld: 363 mg/dL — ABNORMAL HIGH (ref 70–99)
Potassium: 4.1 mmol/L (ref 3.5–5.1)
SODIUM: 123 mmol/L — AB (ref 135–145)
Total Bilirubin: 0.6 mg/dL (ref 0.3–1.2)
Total Protein: 5.2 g/dL — ABNORMAL LOW (ref 6.5–8.1)

## 2018-04-05 LAB — CBC WITH DIFFERENTIAL/PLATELET
BAND NEUTROPHILS: 7 %
BASOS ABS: 0 10*3/uL (ref 0–0.1)
BLASTS: 0 %
Basophils Relative: 0 %
EOS ABS: 0 10*3/uL (ref 0–0.7)
Eosinophils Relative: 0 %
HEMATOCRIT: 30.5 % — AB (ref 35.0–47.0)
HEMOGLOBIN: 10.2 g/dL — AB (ref 12.0–16.0)
LYMPHS PCT: 76 %
Lymphs Abs: 3.4 10*3/uL (ref 1.0–3.6)
MCH: 33.9 pg (ref 26.0–34.0)
MCHC: 33.5 g/dL (ref 32.0–36.0)
MCV: 101.2 fL — AB (ref 80.0–100.0)
Metamyelocytes Relative: 5 %
Monocytes Absolute: 0.1 10*3/uL — ABNORMAL LOW (ref 0.2–0.9)
Monocytes Relative: 3 %
Myelocytes: 1 %
NEUTROS ABS: 0.9 10*3/uL — AB (ref 1.4–6.5)
Neutrophils Relative %: 8 %
OTHER: 0 %
PROMYELOCYTES RELATIVE: 0 %
Platelets: 171 10*3/uL (ref 150–440)
RBC: 3.02 MIL/uL — AB (ref 3.80–5.20)
RDW: 14.4 % (ref 11.5–14.5)
WBC: 4.4 10*3/uL (ref 3.6–11.0)
nRBC: 0 /100 WBC

## 2018-04-05 LAB — TROPONIN I: Troponin I: 0.09 ng/mL (ref <0.03)

## 2018-04-05 LAB — GLUCOSE, CAPILLARY: Glucose-Capillary: 138 mg/dL — ABNORMAL HIGH (ref 70–99)

## 2018-04-05 MED ORDER — ACETAMINOPHEN 325 MG PO TABS: 650.0000 mg | ORAL_TABLET | Freq: Four times a day (QID) | ORAL | Status: DC | PRN

## 2018-04-05 MED ORDER — LORAZEPAM 2 MG/ML IJ SOLN: 1.0000 mg | INTRAMUSCULAR | Status: DC | PRN

## 2018-04-05 MED ORDER — BIOTENE DRY MOUTH MT LIQD: 15.0000 mL | OROMUCOSAL | Status: DC | PRN

## 2018-04-05 MED ORDER — HALOPERIDOL LACTATE 2 MG/ML PO CONC
0.5000 mg | ORAL | Status: DC | PRN
  Filled 2018-04-05: qty 0.3

## 2018-04-05 MED ORDER — MORPHINE SULFATE (CONCENTRATE) 10 MG/0.5ML PO SOLN: 5.0000 mg | ORAL | Status: DC | PRN

## 2018-04-05 MED ORDER — MAGIC MOUTHWASH
15.0000 mL | Freq: Four times a day (QID) | ORAL | Status: DC | PRN
  Filled 2018-04-05: qty 20

## 2018-04-05 MED ORDER — CARBAMAZEPINE 200 MG PO TABS: 200.0000 mg | ORAL_TABLET | Freq: Two times a day (BID) | ORAL | 1 refills | Status: AC

## 2018-04-05 MED ORDER — EPINEPHRINE PF 1 MG/ML IJ SOLN
0.5000 ug/min | INTRAVENOUS | Status: DC
  Filled 2018-04-05: qty 4

## 2018-04-05 MED ORDER — HALOPERIDOL LACTATE 5 MG/ML IJ SOLN: 0.5000 mg | INTRAMUSCULAR | Status: DC | PRN

## 2018-04-05 MED ORDER — MORPHINE SULFATE (PF) 2 MG/ML IV SOLN
2.0000 mg | Freq: Two times a day (BID) | INTRAVENOUS | Status: DC | PRN
  Administered 2018-04-05: 2 mg via INTRAVENOUS

## 2018-04-05 MED ORDER — HALOPERIDOL 0.5 MG PO TABS
0.5000 mg | ORAL_TABLET | ORAL | Status: DC | PRN
  Filled 2018-04-05: qty 1

## 2018-04-05 MED ORDER — MORPHINE SULFATE (PF) 2 MG/ML IV SOLN
INTRAVENOUS | Status: AC
  Administered 2018-04-05: 2 mg via INTRAVENOUS
  Filled 2018-04-05: qty 1

## 2018-04-05 MED ORDER — ACETAMINOPHEN 650 MG RE SUPP: 650.0000 mg | Freq: Four times a day (QID) | RECTAL | Status: DC | PRN

## 2018-04-05 MED ORDER — POLYVINYL ALCOHOL 1.4 % OP SOLN
1.0000 [drp] | Freq: Four times a day (QID) | OPHTHALMIC | Status: DC | PRN
  Filled 2018-04-05: qty 15

## 2018-04-05 MED ORDER — SODIUM CHLORIDE 0.9 % IV SOLN
12.5000 mg | Freq: Four times a day (QID) | INTRAVENOUS | Status: DC | PRN
  Filled 2018-04-05: qty 0.5

## 2018-04-06 ENCOUNTER — Other Ambulatory Visit: Payer: Self-pay

## 2018-04-06 ENCOUNTER — Telehealth: Payer: Self-pay

## 2018-04-06 ENCOUNTER — Ambulatory Visit: Payer: Self-pay

## 2018-04-06 MED FILL — Medication: Qty: 1 | Status: AC

## 2018-04-06 NOTE — Telephone Encounter (Signed)
PLEASE NOTE: All timestamps contained within this report are represented as [place] Standard Time. CONFIDENTIALTY NOTICE: This fax transmission is intended only for the addressee. It contains information that is legally privileged, confidential or otherwise protected from use or disclosure. If you are not the intended recipient, you are strictly prohibited from reviewing, disclosing, copying using or disseminating any of this information or taking any action in reliance on or regarding this information. If you have received this fax in error, please notify us immediately by telephone so that we can arrange for its return to [place]. Phone: [phone], Toll-Free: [phone], Fax: [phone] Page: 1 of 2 Call Id: [id number] [place] Patient Name: Gina [name] A Gender: Female DOB: [birth year] Age: 67 Y 9 M 22 D Return Phone Number: [phone] (Primary) Address: City/State/Zip: [place] [address] Client Joshua Night - Client Client Site [facility] Physician Ria Bush - MD Contact Type Call Who Is Calling Patient / Member / Family / Caregiver Call Type Triage / Clinical Caller Name [facility] Relationship To Patient Sibling Return Phone Number [phone] (Primary) Chief Complaint VOMITING - Blood Reason for Call Symptomatic / Request for Stella states her sister fell and hit her hernia and is now throwing up black. Translation No No Triage Reason Other Nurse Assessment Nurse: Markus Daft, RN, Sherre Poot Date/Time (Eastern Time): May 01, 2018 8:26:38 AM Confirm and document reason for call. If symptomatic, describe symptoms. ---Caller states her sister fell and hit her abdominal hernia and is now vomiting black emesis. Stomach and back hurt. She is in hospital for psychiatric care currently. They  won't take her up one flight to the ER. - She is unable to visit as she is very sick also. Does the patient have any new or worsening symptoms? ---Yes Will a triage be completed? ---No [facility] reason for no triage. ---Other Please document clinical information provided and list any resource used. ---Caller is not with patient. RN advised that she can speak to head nurse for the hospital about the situation as she has already tried to speak to nurses on her unit and the ER. Caller states that the operator has hung up to her before when she calls  main number. She disconnected. Guidelines Guideline Title Affirmed Question Affirmed Notes Nurse Date/Time (Eastern Time) Disp. Time Eilene Ghazi Time) Disposition Final User 2018-05-01 8:24:31 AM Send to Urgent Queue [facility] 05/01/18 8:32:01 AM Clinical Call Yes Markus Daft, RN, Sherre Poot PLEASE NOTE: All timestamps contained within this report are represented as [place] Standard Time. CONFIDENTIALTY NOTICE: This fax transmission is intended only for the addressee. It contains information that is legally privileged, confidential or otherwise protected from use or disclosure. If you are not the intended recipient, you are strictly prohibited from reviewing, disclosing, copying using or disseminating any of this information or taking any action in reliance on or regarding this information. If you have received this fax in error, please notify us immediately by telephone so that we can arrange for its return to [place]. Phone: [phone], Toll-Free: [phone], Fax: [phone] Page: 2 of 2 Call Id: [id number]

## 2018-04-06 NOTE — Patient Outreach (Signed)
[facility]) Care Management  04/06/2018  Gina [name] 08-05-1950 [id number]   Care coordination:  Per chart review patient is deceased.    PLAN; RNCM will close patient  RNCM will send closure notification to patients primary MD  Quinn Plowman RN,BSN,CCM [facility] Telephonic  [phone]

## 2018-04-06 NOTE — Telephone Encounter (Signed)
Spoke with patient's sister Devoria Glassing, expressed my condolences.

## 2018-04-06 NOTE — Telephone Encounter (Signed)
Pt passed away in hospital yesterday.  Called and left message for pt's sister Lelan Pons.  Will try again later.

## 2018-04-13 ENCOUNTER — Ambulatory Visit: Payer: PRIVATE HEALTH INSURANCE | Admitting: Family Medicine

## 2018-04-16 ENCOUNTER — Other Ambulatory Visit: Payer: Self-pay

## 2018-04-16 ENCOUNTER — Ambulatory Visit: Payer: Self-pay | Admitting: Oncology

## 2018-05-04 NOTE — Significant Event (Signed)
Was sitting at nursing station, heard [facility] call out for help.Arrived in room.Noted patient pulseless,apneic.CPR initiated.Code Blue response initiated. Placed on CPR board,multipurpose defib/aed pads placed.

## 2018-05-04 NOTE — Progress Notes (Signed)
Family Meeting Note  Advance Directive:yes  Today a meeting took place with the Patient, sister/care-giver.  Patient is unable to participate due TK:TCCEQF capacity Intubated   The following clinical team members were present during this meeting:MD  The following were discussed:Patient's diagnosis:acute cardiac arrest , Patient's progosis: Unable to determine and Goals for treatment: DNI - sister states pt never wanted to be on life-support/ventilator, wants pt extubated per pt's prior wishes, okay for comfort measures  Additional follow-up to be provided: prn  Time spent during discussion:20 minutes  Gorden Harms, MD

## 2018-05-04 NOTE — Progress Notes (Signed)
Patient ID: Gina [name], female   DOB: 1951/07/10, 67 y.o.   MRN: [id number] Spokr with the sister, Gina [name] to informe her that codr blue was initiated on the patient.

## 2018-05-04 NOTE — Progress Notes (Signed)
RT responded to code blue call to patients room in behavioral health. Fellow RT already in room ventilating patient. This RT gathered intubation supplies for Dr. Florence Canner and assisted with intubation. Patient intubated on first attempt with #7 subglottic /oral ET tube with positive color change on capnometer. Tube was then secured with a commercial tube holder at 22cm a the lip.

## 2018-05-04 NOTE — Progress Notes (Addendum)
Patient was brought to the ICU from the behavioral unit after she suffered a cardiac arrest.  Downtime more than 50-minute as by nursing staff.  I responded to the patient's room  (HR in 30s, SBP in 403s), Dr. Jerelyn Charles (primary care) at the bedside he informed me that there is no need for critical care service as the patient family elected to withdraw support, start for comfort measures and the patient will be terminally extubated. No critical care consult was requested.  No charge

## 2018-05-04 NOTE — Progress Notes (Signed)
D: Patient called writer into room  This am. Stated she couldn't breath  Call for assisted to help patient up in bed . Patient began to deteoriate   A: Code  Called  For support  R: Patient moved to ICU

## 2018-05-04 NOTE — Progress Notes (Signed)
Family arrived at approximately 1130. Lelan Pons (sister) was brought to hospital by neighbor. Sister provided funeral home, [place]. Chaplain offered [employer] of encouragement and assurance. Lelan Pons reported her own pain and chaplain asked if medical assistance was desired. Lelan Pons refused. Lelan Pons appears to have limited support.

## 2018-05-04 NOTE — Progress Notes (Signed)
Patient alert and oriented x 4, denies pain and discomfort no distress noted, affect is flat, but she brightens upon approach. Patient's thoughts are organized and coherent, no bizarre behavior or loud outburst, patient denies SI/HI/AVH not interacting  appropriately with peers and staff isolated to room most of the shift. Patient was given support and encouraged to attend evening group. Patient didn't attend group. 15 minutes safety checks maintained will continue to monitor. Marland Kitchen

## 2018-05-04 NOTE — Discharge Summary (Signed)
Physician Discharge Summary Note  Patient:  Gina [name] is an 67 y.o., female MRN:  [id number] DOB:  May 16, 1951 Patient phone:  [phone] (home)  Patient address:   [place] [address],  Total Time spent with patient: 15 minutes  Date of Admission:  03/16/2018 Date of Discharge: 2018/04/11  Reason for Admission:  Bipolar mania.  History of Present Illness:   Identifying data. Gina [name] is a 67 year old female with a history of bipolar disorder.  Chief complaint. "What happened?"  History of present illness. Information was obtained from the patient and the chart. The patient came to the hospital with full blown mania in the context of medications adjustments. Due to thrombocytopenia, Depakote is being tapered of and Lamictal titrated up. This resulted in break through manic symptoms with severe insomnia. When Restoril 30 mg was added to her regimen last night, she became hypotonic and was transferred to ICU. He return feeling physically better but puzzled about what has happened. She continue to be hypomanic with religious preoccupation, restlessness, intrusiveness and hyper varbality. She denies any symptoms of depression, psychosis or anxiety. She uses no drugs or alcohol.  Past psychiatric history. Long history of bipolar with multiple hospitalizations and medication trials. Did well on Seroquel, Invega and Depakote for a few months. Denies suicide attempts. Follows up with [facility] team.  Family psychiatric history, Sister with intellectual disability.  Social history. Disabled from mental illness. Lives with her sister.   Principal Problem: Bipolar I disorder, current or most recent episode manic, with psychotic features [facility]) Discharge Diagnoses: Patient Active Problem List   Diagnosis Date Noted  . Bipolar I disorder, current or most recent episode manic, with psychotic features ([place]) [F31.2] 11/04/2017    Priority: High  .  Altered mental status, unspecified [R41.82] 03/16/2018  . Hypotension [I95.9] 03/15/2018  . Rash of hands [R21] 02/18/2018  . Pain of right heel [M79.671] 12/30/2017  . Encounter for chronic pain management [G89.29] 12/18/2017  . Foot pain, bilateral [id number], M79.672] 11/25/2017  . Gallbladder polyp [K82.4] 10/21/2017  . Left shoulder pain [M25.512] 10/21/2017  . Benign neoplasm of ascending colon [D12.2]   . Low back pain radiating to left lower extremity [M54.5, M79.605] 02/26/2017  . Acquired hypothyroidism [E03.9] 09/10/2016  . Thrombocytopenia ([place]) [D69.6] 06/15/2016  . Medicare annual wellness visit, subsequent [Z00.00] 06/04/2016  . Advanced care planning/counseling discussion [Z71.89] 06/04/2016  . External hemorrhoid [K64.4] 04/02/2016  . Slow transit constipation [K59.01] 01/04/2016  . Bipolar I disorder, most recent episode (or current) manic ([place]) [F31.10] 12/03/2015  . NAFLD (nonalcoholic fatty liver disease) [K76.0] 03/28/2015  . Gallbladder calculus without cholecystitis [K80.20] 03/28/2015  . Gout [M10.9] 03/27/2015  . HCV antibody positive [R76.8] 03/27/2015  . History of bladder cancer [Z85.51] 03/27/2015  . History of bundle branch block [Z86.79] 03/27/2015  . Genetic testing [Z13.79] 02/28/2015  . Loss of memory [R41.3] 09/04/2014  . Controlled type 2 diabetes mellitus with diabetic nephropathy ([place]) [E11.21] 06/07/2014  . Hyperlipidemia [E78.5]   . Anemia in CKD (chronic kidney disease) [N18.9, D63.1] 01/26/2013  . Personal history of colonic adenomas and colon cancer [Z86.010] 11/11/2012  . Osteoarthritis of knee [M17.10] 10/02/2012  . OSA on CPAP [G47.33, Z99.89]   . CKD (chronic kidney disease) stage 4, GFR 15-29 ml/min (HCC) [N18.4]   . Severe obesity (BMI 35.0-39.9) with comorbidity ([place]) [E66.01]   . HTN (hypertension), benign [I10] 04/07/2012  . Hx of colon cancer, stage I [Z85.038] 08/05/2011   Past Medical  History:  Past Medical History:   Diagnosis Date  . Anemia in chronic kidney disease   . Bipolar depression [facility])    sees psychiatrist - psych admission 03/2015  . Bladder cancer ([place]) 1990's  . Blood transfusion without reported diagnosis 2009  . Cataract    left  . CKD (chronic kidney disease) stage 3, GFR 30-59 ml/min (HCC)   . Colon cancer ([place]) 1990's  . DJD (degenerative joint disease), lumbar    chronic lower back pain  . Enterocutaneous fistula 04/07/2012   completed PT 06/2012 ([facility])  . Family history of breast cancer   . Family history of colon cancer   . Family history of stomach cancer   . GERD (gastroesophageal reflux disease)   . History of bladder cancer 1997  . History of colon cancer    s/p surgery  . History of uterine cancer    s/p hysterectomy  . Hyperlipidemia   . Hypertension   . IDA (iron deficiency anemia) 01/2013   thought due to h/o polyps  . Obesity (BMI 30-39.9)   . OSA on CPAP    6cm H2O  . Personal history of colonic adenomas and colon cancer 11/11/2012  . Positive hepatitis C antibody test 09/2014   but negative confirmatory testing  . RBBB   . Uncontrolled type 2 diabetes mellitus with nephropathy ([place]) 06/07/2014   completed DSME 02/2016    Past Surgical History:  Procedure Laterality Date  . BREAST BIOPSY Right 01/2014   fibroadenoma  . COLONOSCOPY  07/2009  . COLONOSCOPY  11/2012   2 tubular adenomas, mild diverticulosis, pending genetic testing for Lynch syndrome [name]) rpt 2 yrs  . COLONOSCOPY  02/2015   TA, diverticulosis, rpt 2 yrs [name])  . COLONOSCOPY WITH PROPOFOL N/A 06/09/2017   TAx1, rpt 2 yrs [name], [name], MD)  . DEXA  12/2009   WNL  . DOBUTAMINE STRESS ECHO  12/2009   no evidence of ischemia  . HERNIA REPAIR  02/04/12  . i & d abdominal wound  02/19/12  . LEFT OOPHORECTOMY  2005  . PARTIAL COLECTOMY  about 2008   for colon cancer  . PARTIAL HYSTERECTOMY  1981   uterine cancer, R ovary remains  . Reexploration of abdominal wound and Allograft  placemet  02/26/12  . Removal of infected mesh and abdominal wound vac placement  02/24/12  . sleep study  02/2014   OSA - AHI 55, nadir 81% [name])   Family History:  Family History  Problem Relation Age of Onset  . Colon cancer Mother 65  . Stomach cancer Mother        dx in her 42s?  . CAD Father        MI; deceased 35  . Mental illness Sister        anxiety/depression  . Thyroid disease Sister   . Breast cancer Maternal Grandmother        age at dx unknown  . Diabetes Brother   . Diabetes Brother   . Diabetes Other        aunts and uncles both sides  . Arthritis Other        strong fmhx  . Colon cancer Other 62       maternal half-sister; deceased   Social History:  Social History   Substance and Sexual Activity  Alcohol Use No  . Alcohol/week: 0.0 standard drinks     Social History   Substance and Sexual Activity  Drug Use No  Social History   Socioeconomic History  . Marital status: Single    Spouse name: Not on file  . Number of children: 0  . Years of education: Not on file  . Highest education level: Not on file  Occupational History  . Not on file  Social Needs  . Financial resource strain: Not on file  . Food insecurity:    Worry: Not on file    Inability: Not on file  . Transportation needs:    Medical: Not on file    Non-medical: Not on file  Tobacco Use  . Smoking status: Never Smoker  . Smokeless tobacco: Never Used  Substance and Sexual Activity  . Alcohol use: No    Alcohol/week: 0.0 standard drinks  . Drug use: No  . Sexual activity: Never  Lifestyle  . Physical activity:    Days per week: Not on file    Minutes per session: Not on file  . Stress: Not on file  Relationships  . Social connections:    Talks on phone: Not on file    Gets together: Not on file    Attends religious service: Not on file    Active member of club or organization: Not on file    Attends meetings of clubs or organizations: Not on file     Relationship status: Not on file  Other Topics Concern  . Not on file  Social History Narrative   Lives with sister, no pets   Occupation: disabled, for bipolar and arthritis   Edu: GED   Activity: take walks   Diet: good water, vegetables daily   Religion: [place], Dr. Jimmye Norman (ph [phone])    Hospital Course:   Ms. Clover is a 67 year old female with a history of bipolar disorder admitted for a manic episode in the context of medication changes.Depakote had to be disontinued gradually due to thrombocytopenia. Unfortunately, she became manic and Lamictal, used as substitute, was ineffective. We tried higher dose of Seroquel, Tegretol and Geodon but manic symptoms continued. Over the weekend, the patient complained of abdominal pain. Abdominal xray on 9/1 was negative. This morning, the patient had breakfast in the dining area. She returned to her room to lay down complaining of shortness of breath. Her abdomen became distended and the patient had difficulties breathing. She was turned on her side and brownish contents of her stomach spilled out. She became unresponsive, with clammy skin. Code blue was initiated. The patient was transferred to ICU for further care.  #Bipolar disorder, mania -we discontinued Invega sustenna injections as this was of doubtful benefit -continue Seroquel 400 mg nightly -continueTegretol 200 mg BID, level in AM -continueGeodon to 80 mg BID  #Thrombocytopenia, resolved -Depakotewas discontinued -CBC 152 on 8/31  #Nausea/  #Fall risk -PT input is appreciated -patient uses a walker  #Arthritis -continueVoltaren 75 mg BID  #Sleep apnea -CPAP nightly  #Hypothyroidism -Continue Synthroid 50 mcg daily  #HTN, BP elevated -increase Metoprolol 25 mg BID -discontinue Losartandue to hyperkaliemia, K level 4.9 on 8/31  #DM -Tradjenta 5 mg daily -ADA diet  #Constipation -bowel  regimen -Anusol PRN -add Linzess  #Dislipidemia  -Zocor 20 mg daily  #Labs -Hemoglobin A1c was 6.6 and total cholesterol was 140  #Fall -head CT negative -PT consult is appreciated -patient has a new walker  #Disposition -discharge to home with the sister -sister will briefly visit on Saturday at noon -follow up with ACT team  Physical Findings: AIMS:  , ,  ,  ,    CIWA:    COWS:     Musculoskeletal: Strength & Muscle Tone: flaccid Gait & Station: unable to stand Patient leans: N/A  Psychiatric Specialty Exam: Physical Exam  GI:  Distended abdomen  Skin: There is pallor.  clammy    Review of Systems  Unable to perform ROS: Acuity of condition  All other systems reviewed and are negative.   Blood pressure 121/83, pulse (!) 115, temperature 98.6 F (37 C), temperature source Oral, resp. rate 16, height 5' 2"  (1.575 m), weight 87.1 kg, SpO2 99 %.Body mass index is 35.12 kg/m.  General Appearance: Casual  Eye Contact:  None  Speech:  unable to assess  Volume:  Decreased  Mood:  unable to assess  Affect:  unable to assess  Thought Process:  NA  Orientation:  NA  Thought Content:  unable to assess  Suicidal Thoughts:  unable to assess  Homicidal Thoughts:  unable to assess  Memory:  NA  Judgement:  NA  Insight:  NA  Psychomotor Activity:  Flacid  Concentration:  Concentration: NA and Attention Span: NA  Recall:  NA  Fund of Knowledge:  NA  Language:  NA  Akathisia:  NA  Handed:  Right  AIMS (if indicated):     Assets:  Desire for Improvement Financial Resources/Insurance Housing Resilience Social Support  ADL's:  Impaired  Cognition:  Impaired,  Mild  Sleep:  Number of Hours: 4.75     Have you used any form of tobacco in the last 30 days? (Cigarettes, Smokeless Tobacco, Cigars, and/or Pipes): No  Has this patient used any form of tobacco in the last 30 days? (Cigarettes, Smokeless Tobacco, Cigars, and/or Pipes) Yes, No  Blood Alcohol  level:  Lab Results  Component Value Date   ETH <10 03/11/2018   ETH <10 19/14/7829    Metabolic Disorder Labs:  Lab Results  Component Value Date   HGBA1C 6.6 (H) 02/18/2018   MPG 122.63 11/14/2017   MPG 114 04/14/2012   Lab Results  Component Value Date   PROLACTIN 33.6 (H) 12/04/2015   Lab Results  Component Value Date   CHOL 140 11/14/2017   TRIG 152 (H) 11/14/2017   HDL 50 11/14/2017   CHOLHDL 2.8 11/14/2017   VLDL 30 11/14/2017   LDLCALC 60 11/14/2017   LDLCALC 81 09/30/2017    See Psychiatric Specialty Exam and Suicide Risk Assessment completed by Attending Physician prior to discharge.  Discharge destination:  Other:  ICU  Is patient on multiple antipsychotic therapies at discharge:  Yes,   Do you recommend tapering to monotherapy for antipsychotics?  Yes   Has Patient had three or more failed trials of antipsychotic monotherapy by history:  No  Recommended Plan for Multiple Antipsychotic Therapies: Patient's medications are in the process of a cross-taper;  medications include:  Seroquel and Geodon  Discharge Instructions    Diet - low sodium heart healthy   Complete by:  As directed    Increase activity slowly   Complete by:  As directed      Allergies as of 28-Apr-2018      Reactions   Risperidone And Related Other (See Comments)   Reaction:  Altered mental status    Penicillins Other (See Comments)   Pt states that this med knocks her out.  Has patient had a PCN reaction causing immediate rash, facial/tongue/throat swelling, SOB or lightheadedness with hypotension Unsure  Has patient had  a PCN reaction causing severe rash involving mucus membranes or skin necrosis Unsure  Has patient had a PCN reaction that required hospitalization Unsure  Has patient had a PCN reaction occurring within the last 10 years Unsure  If all of the above answers are "NO", then may proceed with Cephalosporin use.   Ivp Dye [iodinated Diagnostic Agents] Rash   Tetanus  Toxoids Rash      Zetia [ezetimibe] Rash      Medication List    STOP taking these medications   clotrimazole 1 % cream Commonly known as:  LOTRIMIN   diclofenac sodium 1 % Gel Commonly known as:  VOLTAREN   divalproex 250 MG DR tablet Commonly known as:  DEPAKOTE   divalproex 500 MG DR tablet Commonly known as:  DEPAKOTE   INVEGA SUSTENNA 234 MG/1.5ML Susp injection Generic drug:  paliperidone   lamoTRIgine 25 MG tablet Commonly known as:  LAMICTAL   losartan 50 MG tablet Commonly known as:  COZAAR   triamcinolone cream 0.1 % Commonly known as:  KENALOG     TAKE these medications     Indication  ACCU-CHEK AVIVA PLUS test strip Generic drug:  glucose blood 1 each by Other route daily. E11.21  Indication:  diabetes care   bisacodyl 10 MG suppository Commonly known as:  DULCOLAX Place 1 suppository (10 mg total) rectally daily as needed for moderate constipation.  Indication:  Constipation   carbamazepine 200 MG tablet Commonly known as:  TEGRETOL Take 1 tablet (200 mg total) by mouth 2 (two) times daily at 8 am and 10 pm.  Indication:  Manic-Depression   docusate sodium 100 MG capsule Commonly known as:  COLACE Take 2 capsules (200 mg total) by mouth 2 (two) times daily.  Indication:  Constipation   GAVILAX powder Generic drug:  polyethylene glycol powder TAKE 17 GRAMS BY MOUTH TWICE DAILY AS NEEDED FOR MODERATE CONSTIPATION. What changed:  See the new instructions.  Indication:  Constipation   levothyroxine 50 MCG tablet Commonly known as:  SYNTHROID, LEVOTHROID Take 1 tablet (50 mcg total) by mouth daily before breakfast.  Indication:  Underactive Thyroid   metoprolol tartrate 25 MG tablet Commonly known as:  LOPRESSOR Take 0.5 tablets (12.5 mg total) by mouth 2 (two) times daily.  Indication:  High Blood Pressure Disorder   QUEtiapine 300 MG tablet Commonly known as:  SEROQUEL Take 1 tablet (300 mg total) by mouth at bedtime.  Indication:   Manic Phase of Manic-Depression   senna 8.6 MG Tabs tablet Commonly known as:  SENOKOT Take 1 tablet (8.6 mg total) by mouth at bedtime as needed for mild constipation.  Indication:  Constipation   simvastatin 20 MG tablet Commonly known as:  ZOCOR Take 1 tablet (20 mg total) by mouth at bedtime.  Indication:  High Amount of Fats in the Blood   sitaGLIPtin 25 MG tablet Commonly known as:  JANUVIA Take 1 tablet (25 mg total) by mouth daily.  Indication:  Type 2 Diabetes      Follow-up Information    Services, Psychotherapeutic Follow up.   Why:  TBD Contact information: [address] [place] [address] [phone]           Follow-up recommendations:  Other:  patient transferred to ICU  Comments:    Signed: Orson Slick, MD 04-12-18, 7:00 PM

## 2018-05-04 NOTE — Death Summary Note (Signed)
[facility] [place] NAME: Gina [name]    MR#:  [id number]  DATE OF BIRTH:  12-16-1950  DATE OF ADMISSION:  06-Apr-2018 ADMITTING PHYSICIAN: Wilhelmina Mcardle, MD  DATE OF DISCHARGE: April 06, 2018  4:43 PM  PRIMARY CARE PHYSICIAN: Ria Bush, MD    ADMISSION DIAGNOSIS:  cardiac arrest  DISCHARGE DIAGNOSIS:  Active Problems:   * No active hospital problems. *   SECONDARY DIAGNOSIS:   Past Medical History:  Diagnosis Date  . Anemia in chronic kidney disease   . Bipolar depression [facility])    sees psychiatrist - psych admission 03/2015  . Bladder cancer ([place]) 1990's  . Blood transfusion without reported diagnosis 09-30-2007  . Cataract    left  . CKD (chronic kidney disease) stage 3, GFR 30-59 ml/min (HCC)   . Colon cancer ([place]) 1990's  . DJD (degenerative joint disease), lumbar    chronic lower back pain  . Enterocutaneous fistula 04/07/2012   completed PT 06/2012 ([facility])  . Family history of breast cancer   . Family history of colon cancer   . Family history of stomach cancer   . GERD (gastroesophageal reflux disease)   . History of bladder cancer 1997  . History of colon cancer    s/p surgery  . History of uterine cancer    s/p hysterectomy  . Hyperlipidemia   . Hypertension   . IDA (iron deficiency anemia) 01/2013   thought due to h/o polyps  . Obesity (BMI 30-39.9)   . OSA on CPAP    6cm H2O  . Personal history of colonic adenomas and colon cancer 11/11/2012  . Positive hepatitis C antibody test 2014/09/29   but negative confirmatory testing  . RBBB   . Uncontrolled type 2 diabetes mellitus with nephropathy ([place]) 06/07/2014   completed DSME 02/2016    HOSPITAL COURSE:  Time of death 30-Sep-1103 Pt found w/o pulse, respirations, CN reflexes, family made aware.  *Acute cardiac arrest *Acute ventricular tachycardia *Acute comatose state *Acute GI bleeding  DISCHARGE CONDITIONS:   dead  CONSULTS OBTAINED:   none  DRUG ALLERGIES:   Allergies  Allergen Reactions  . Risperidone And Related Other (See Comments)    Reaction:  Altered mental status   . Penicillins Other (See Comments)    Pt states that this med knocks her out.  Has patient had a PCN reaction causing immediate rash, facial/tongue/throat swelling, SOB or lightheadedness with hypotension Unsure  Has patient had a PCN reaction causing severe rash involving mucus membranes or skin necrosis Unsure  Has patient had a PCN reaction that required hospitalization Unsure  Has patient had a PCN reaction occurring within the last 10 years Unsure  If all of the above answers are "NO", then may proceed with Cephalosporin use.  Clementeen Hoof [Iodinated Diagnostic Agents] Rash  . Tetanus Toxoids Rash       . Zetia [Ezetimibe] Rash    DISCHARGE MEDICATIONS:  none  DISCHARGE INSTRUCTIONS:   If you experience worsening of your admission symptoms, develop shortness of breath, life threatening emergency, suicidal or homicidal thoughts you must seek medical attention immediately by calling 911 or calling your MD immediately  if symptoms less severe.  You Must read complete instructions/literature along with all the possible adverse reactions/side effects for all the Medicines you take and that have been prescribed to you. Take any new Medicines after you have completely understood and accept all the possible adverse reactions/side effects.  Please note  You were cared for by a hospitalist during your hospital stay. If you have any questions about your discharge medications or the care you received while you were in the hospital after you are discharged, you can call the unit and asked to speak with the hospitalist on call if the hospitalist that took care of you is not available. Once you are discharged, your primary care physician will handle any further medical issues. Please note that NO REFILLS for any discharge medications will be authorized  once you are discharged, as it is imperative that you return to your primary care physician (or establish a relationship with a primary care physician if you do not have one) for your aftercare needs so that they can reassess your need for medications and monitor your lab values.  Today   CHIEF COMPLAINT:  Cardiac arrest  HISTORY OF PRESENT ILLNESS:  67 y.o. female with a known history per below transferred to the ICU from outside facility, patient was in acute cardiac arrest, ACLS protocol initiated, code ran by ED attending, patient noted to have hemodynamic instability with ventricular tachycardia, appears a severe bradycardia, severe hypotension, black coffee-ground emesis noted, was placed on amiodarone bolus with drip, epinephrine drip, dopamine, hospitalist staff asked to assist in case, discussed with ICU team-sister was contacted whom is the primary care provider/lives with the patient, per the patient's sister-patient never wanted to be intubated/given extreme poor prognosis-sister wanted her sister extubated per her prior wishes/okay for comfort care going forward with expectant management, all questions were answered, information related to ICU team.  VITAL SIGNS:  Pulse (!) 58, resp. rate 19, SpO2 94 %.  I/O:  No intake or output data in the 24 hours ending 04/14/2018 1809  PHYSICAL EXAMINATION:  GENERAL:  67 y.o.-year-old patient lying in the bed with no acute distress.  EYES: Pupils equal, round, reactive to light and accommodation. No scleral icterus. Extraocular muscles intact.  HEENT: Head atraumatic, normocephalic. Oropharynx and nasopharynx clear.  NECK:  Supple, no jugular venous distention. No thyroid enlargement, no tenderness.  LUNGS: Normal breath sounds bilaterally, no wheezing, rales,rhonchi or crepitation. No use of accessory muscles of respiration.  CARDIOVASCULAR: S1, S2 normal. No murmurs, rubs, or gallops.  ABDOMEN: Soft, non-tender, non-distended. Bowel sounds  present. No organomegaly or mass.  EXTREMITIES: No pedal edema, cyanosis, or clubbing.  NEUROLOGIC: Cranial nerves II through XII are intact. Muscle strength 5/5 in all extremities. Sensation intact. Gait not checked.  PSYCHIATRIC: The patient is alert and oriented x 3.  SKIN: No obvious rash, lesion, or ulcer.   DATA REVIEW:   CBC Recent Labs  Lab 04-14-2018 1007  WBC 4.4  HGB 10.2*  HCT 30.5*  PLT 171    Chemistries  Recent Labs  Lab Apr 14, 2018 1007  NA 123*  K 4.1  CL 83*  CO2 20*  GLUCOSE 363*  BUN 50*  CREATININE 2.53*  CALCIUM 13.9*  AST 43*  ALT 31  ALKPHOS 86  BILITOT 0.6    Cardiac Enzymes Recent Labs  Lab 2018-04-14 1007  TROPONINI 0.09*    Microbiology Results  Results for orders placed or performed during the hospital encounter of 03/15/18  MRSA PCR Screening     Status: Abnormal   Collection Time: 03/15/18 11:11 PM  Result Value Ref Range Status   MRSA by PCR POSITIVE (A) NEGATIVE Final    Comment:        The GeneXpert MRSA Assay (FDA approved for NASAL specimens only), is  one component of a comprehensive MRSA colonization surveillance program. It is not intended to diagnose MRSA infection nor to guide or monitor treatment for MRSA infections. RESULT CALLED TO, READ BACK BY AND VERIFIED WITH: ALIYAH BARNES ON 03/13/18 AT 0112 BY JAG Performed at [facility], [address] [place], [place] [address]     RADIOLOGY:  Dg Abd 2 Views  Result Date: 04/04/2018 CLINICAL DATA:  Nausea, vomiting and abdominal pain beginning yesterday. EXAM: ABDOMEN - 2 VIEW COMPARISON:  CT abdomen and pelvis 11/29/2017. Chest in two views abdomen 05/04/2015. FINDINGS: The bowel gas pattern is normal. There is no evidence of free air. No radio-opaque calculi or other significant radiographic abnormality is seen. IMPRESSION: Negative exam. Electronically Signed   By: Inge Rise M.D.   On: 04/04/2018 12:15    EKG:   Orders placed or performed during  the hospital encounter of 03/16/18  . EKG 12-Lead  . EKG 12-Lead  . EKG 12-Lead  . EKG 12-Lead  . EKG 12-Lead  . EKG 12-Lead   Management plans discussed with the patient, family and they are in agreement.  CODE STATUS: dnr    Code Status Orders  (From admission, onward)         Start     Ordered   04-21-2018 1040  Do not attempt resuscitation (DNR)  Continuous    Question Answer Comment  In the event of cardiac or respiratory ARREST Do not call a "code blue"   In the event of cardiac or respiratory ARREST Do not perform Intubation, CPR, defibrillation or ACLS   In the event of cardiac or respiratory ARREST Use medication by any route, position, wound care, and other measures to relive pain and suffering. May use oxygen, suction and manual treatment of airway obstruction as needed for comfort.   Comments nurse may pronounce      04-21-18 1041        Code Status History    Date Active Date Inactive Code Status Order ID Comments User Context   03/16/2018 1506 04-21-2018 1017 Full Code [id number]  Clovis Fredrickson, MD Inpatient   03/15/2018 2331 03/16/2018 1500 Full Code [id number]  Lance Coon, MD Inpatient   03/11/2018 2329 03/15/2018 2305 Full Code [id number]  Clovis Fredrickson, MD Inpatient   11/13/2017 2220 11/24/2017 1542 Full Code [id number]  [name], MD Inpatient   11/04/2017 1746 11/09/2017 1600 Full Code [id number]  [name], MD Inpatient   12/03/2015 2043 12/19/2015 1439 Full Code [id number]  [name], MD Inpatient   04/13/2012 1506 05/15/2012 1637 Full Code [id number]  Ermalinda Memos Inpatient      TOTAL TIME TAKING CARE OF THIS PATIENT: 45 minutes.    Avel Peace Salary M.D on 21-Apr-2018 at 6:09 PM  Between 7am to 6pm - Pager - [phone]  After 6pm go to [email or web address] - password [email or web address] [facility]  Office  [phone]  CC: Primary care physician; Ria Bush, MD

## 2018-05-04 NOTE — Progress Notes (Signed)
D:Patient out of bed this am  Ambulated to dayroom for breakfast . Appetite good . Patient voice she is still spitting up spit . Ambulated  Back to room  With walker . Gait normal .Compliant with am medications . Informed patient there is one medication that she didn't get  Will bring  Later in am  A: writer observed no distress  Patient sitting on side of bed  R: Continue to monitor

## 2018-05-04 NOTE — Progress Notes (Signed)
Central Telemetry called about asystole on the monitor, heart and lungs auscultated for over a minute with no sounds at 1105.

## 2018-05-04 NOTE — BHH Suicide Risk Assessment (Signed)
[facility] Discharge Suicide Risk Assessment   Principal Problem: Bipolar I disorder, current or most recent episode manic, with psychotic features Gina [name]) Discharge Diagnoses:  Patient Active Problem List   Diagnosis Date Noted  . Bipolar I disorder, current or most recent episode manic, with psychotic features ([place]) [F31.2] 11/04/2017    Priority: High  . Altered mental status, unspecified [R41.82] 03/16/2018  . Hypotension [I95.9] 03/15/2018  . Rash of hands [R21] 02/18/2018  . Pain of right heel [M79.671] 12/30/2017  . Encounter for chronic pain management [G89.29] 12/18/2017  . Foot pain, bilateral [id number], M79.672] 11/25/2017  . Gallbladder polyp [K82.4] 10/21/2017  . Left shoulder pain [M25.512] 10/21/2017  . Benign neoplasm of ascending colon [D12.2]   . Low back pain radiating to left lower extremity [M54.5, M79.605] 02/26/2017  . Acquired hypothyroidism [E03.9] 09/10/2016  . Thrombocytopenia ([place]) [D69.6] 06/15/2016  . Medicare annual wellness visit, subsequent [Z00.00] 06/04/2016  . Advanced care planning/counseling discussion [Z71.89] 06/04/2016  . External hemorrhoid [K64.4] 04/02/2016  . Slow transit constipation [K59.01] 01/04/2016  . Bipolar I disorder, most recent episode (or current) manic ([place]) [F31.10] 12/03/2015  . NAFLD (nonalcoholic fatty liver disease) [K76.0] 03/28/2015  . Gallbladder calculus without cholecystitis [K80.20] 03/28/2015  . Gout [M10.9] 03/27/2015  . HCV antibody positive [R76.8] 03/27/2015  . History of bladder cancer [Z85.51] 03/27/2015  . History of bundle branch block [Z86.79] 03/27/2015  . Genetic testing [Z13.79] 02/28/2015  . Loss of memory [R41.3] 09/04/2014  . Controlled type 2 diabetes mellitus with diabetic nephropathy ([place]) [E11.21] 06/07/2014  . Hyperlipidemia [E78.5]   . Anemia in CKD (chronic kidney disease) [N18.9, D63.1] 01/26/2013  . Personal history of colonic adenomas and colon cancer [Z86.010] 11/11/2012  . Osteoarthritis of  knee [M17.10] 10/02/2012  . OSA on CPAP [G47.33, Z99.89]   . CKD (chronic kidney disease) stage 4, GFR 15-29 ml/min (HCC) [N18.4]   . Severe obesity (BMI 35.0-39.9) with comorbidity ([place]) [E66.01]   . HTN (hypertension), benign [I10] 04/07/2012  . Hx of colon cancer, stage I [Z85.038] 08/05/2011    Total Time spent with patient: 15 minutes  Musculoskeletal: Strength & Muscle Tone: decreased Gait & Station: unable to stand Patient leans: N/A  Psychiatric Specialty Exam: Review of Systems  Respiratory: Positive for shortness of breath.   Gastrointestinal: Positive for abdominal pain and vomiting.  Neurological: Negative.   Psychiatric/Behavioral: Negative.   All other systems reviewed and are negative.   Blood pressure 121/83, pulse (!) 115, temperature 98.6 F (37 C), temperature source Oral, resp. rate 16, height 5\' 2"  (1.575 m), weight 87.1 kg, SpO2 99 %.Body mass index is 35.12 kg/m.  General Appearance: Casual  Eye Contact::  None  Speech:  unable to assess409  Volume:  Decreased  Mood:  unable to assess  Affect:  unable to assess  Thought Process:  NA  Orientation:  NA  Thought Content:  unable to assess  Suicidal Thoughts:  unable to assess  Homicidal Thoughts:  unable to assess  Memory:  NA  Judgement:  NA  Insight:  NA  Psychomotor Activity:  NA  Concentration:  NA  Recall:  NA  Fund of Knowledge:NA  Language: NA  Akathisia:  NA  Handed:  Right  AIMS (if indicated):     Assets:  Catering manager Housing Social Support  Sleep:  Number of Hours: 4.75  Cognition: Impaired,  Severe  ADL's:  Intact   Mental Status Per Nursing Assessment::   On Admission:  NA  Demographic Factors:  Divorced  or widowed and Caucasian  Loss Factors: NA  Historical Factors: Impulsivity  Risk Reduction Factors:   Sense of responsibility to family, Religious beliefs about death, Living with another person, especially a relative, Positive social support and  Positive therapeutic relationship  Continued Clinical Symptoms:  Bipolar Disorder:   Mixed State  Cognitive Features That Contribute To Risk:  None    Suicide Risk:  Minimal: No identifiable suicidal ideation.  Patients presenting with no risk factors but with morbid ruminations; may be classified as minimal risk based on the severity of the depressive symptoms  Follow-up Information    Services, Psychotherapeutic Follow up.   Why:  TBD Contact information: [address] [place] [address] [phone]           Plan Of Care/Follow-up recommendations:  Other:  transfer to ICU  Orson Slick, MD 04-09-18, 10:09 AM

## 2018-05-04 NOTE — Plan of Care (Signed)
  Problem: Coping: Goal: Ability to verbalize frustrations and anger appropriately will improve Outcome: Progressing   Problem: Coping: Goal: Ability to verbalize frustrations and anger appropriately will improve Outcome: Progressing   Problem: Coping: Goal: Ability to verbalize frustrations and anger appropriately will improve Outcome: Progressing  Patient verbalized frustration to staff.

## 2018-05-04 NOTE — Progress Notes (Signed)
Pt. Extubated to comfort care.

## 2018-05-04 NOTE — Progress Notes (Signed)
Contacted by her sister Marlane Mingle knew [phone].States her sister is "throwing up large amounts of black stuff, has a hernia that is getting bigger, you people are not doing anything to help her.She is still complaining of back pain from the fall she had using the faulty walker you gave her.You have started talking about discharging her, but her hernias are getting bigger.You haven't been taking care of her.I asked if she could have some scans done, was told those studies couldn't be done because it is a holiday.I want her run through some kind of machine before she leaves your hospital". Told sister I would share her concerns with the patient's nurse and ask the RN to have the MD call her. Arrived on unit, Dr. Gwynn Burly on unit.Told her had spoken with sister and the concerns, shared concerns with Tami Lin RN.

## 2018-05-04 NOTE — Progress Notes (Signed)
Code Blue. Offered presence from beginning of code through til time of death at 64. Chaplain spoke to sister approximately 950am to offer update and assess wishes. Sister was distressed due to feeling hopeless related to sisters condition. Chaplain offered to have prayer. Prayer was offered over the phone. Sister shared "My sister wanted to be a nun and my mother would not let her." Chaplain stayed with Gina [name] and offered several prayers and played music. Chaplain called sister at 67, November 17, 1107 and November 17, 1111 to report death, obtained funeral home and speak of autopsy. No answer. Left message to call hospital.

## 2018-05-04 NOTE — Progress Notes (Signed)
Responded to Code Blue. Bagged pt with 100% O2. Continued to bag after intubation and while transporting to the CCU.

## 2018-05-04 NOTE — Progress Notes (Signed)
Sister .Marland KitchenVerdis Frederickson came and saw patient and indicated [facility]. Pt has no valuables. CDS notified and pt is not a donor. All lines removed and pt to morgue

## 2018-05-04 NOTE — Progress Notes (Addendum)
Patient made comfort care and to extubate per Dr. Jerelyn Charles- Pt given 2mg  of morphine for comfort and pt extubated.  Patient passed away @ 11:05.  Sister being called at this time to be updated by chaplain. Dr. Jerelyn Charles paged to be aware of patient's passing.

## 2018-05-04 NOTE — ED Provider Notes (Signed)
[facility] Department of Emergency Medicine   Code Blue CONSULT NOTE  Chief Complaint: Cardiac arrest/unresponsive   Level V Caveat: Unresponsive  History of present illness: I was contacted by the hospital for a CODE BLUE cardiac arrest upstairs and presented to the patient's bedside.  Patient was found to be in cardiac arrest with CPR in progress upon arrival.  No immediate history was known for the patient by the nursing staff or signs of the patient was therefore depression and psychiatric reasons.  Patient had been hospitalized for some time but this was not clear either.  No medical history was known at the time of arrival.  ROS: Unable to obtain, Level V caveat  Scheduled Meds: Continuous Infusions: PRN Meds:. Past Medical History:  Diagnosis Date  . Anemia in chronic kidney disease   . Bipolar depression [facility])    sees psychiatrist - psych admission 03/2015  . Bladder cancer ([place]) 1990's  . Blood transfusion without reported diagnosis 2009  . Cataract    left  . CKD (chronic kidney disease) stage 3, GFR 30-59 ml/min (HCC)   . Colon cancer ([place]) 1990's  . DJD (degenerative joint disease), lumbar    chronic lower back pain  . Enterocutaneous fistula 04/07/2012   completed PT 06/2012 ([facility])  . Family history of breast cancer   . Family history of colon cancer   . Family history of stomach cancer   . GERD (gastroesophageal reflux disease)   . History of bladder cancer 1997  . History of colon cancer    s/p surgery  . History of uterine cancer    s/p hysterectomy  . Hyperlipidemia   . Hypertension   . IDA (iron deficiency anemia) 01/2013   thought due to h/o polyps  . Obesity (BMI 30-39.9)   . OSA on CPAP    6cm H2O  . Personal history of colonic adenomas and colon cancer 11/11/2012  . Positive hepatitis C antibody test 09/2014   but negative confirmatory testing  . RBBB   . Uncontrolled type 2 diabetes mellitus with nephropathy ([place])  06/07/2014   completed DSME 02/2016   Past Surgical History:  Procedure Laterality Date  . BREAST BIOPSY Right 01/2014   fibroadenoma  . COLONOSCOPY  07/2009  . COLONOSCOPY  11/2012   2 tubular adenomas, mild diverticulosis, pending genetic testing for Lynch syndrome [name]) rpt 2 yrs  . COLONOSCOPY  02/2015   TA, diverticulosis, rpt 2 yrs [name])  . COLONOSCOPY WITH PROPOFOL N/A 06/09/2017   TAx1, rpt 2 yrs [name], [name], MD)  . DEXA  12/2009   WNL  . DOBUTAMINE STRESS ECHO  12/2009   no evidence of ischemia  . HERNIA REPAIR  02/04/12  . i & d abdominal wound  02/19/12  . LEFT OOPHORECTOMY  2005  . PARTIAL COLECTOMY  about 2008   for colon cancer  . PARTIAL HYSTERECTOMY  1981   uterine cancer, R ovary remains  . Reexploration of abdominal wound and Allograft placemet  02/26/12  . Removal of infected mesh and abdominal wound vac placement  02/24/12  . sleep study  02/2014   OSA - AHI 55, nadir 81% [name])   Social History   Socioeconomic History  . Marital status: Single    Spouse name: Not on file  . Number of children: 0  . Years of education: Not on file  . Highest education level: Not on file  Occupational History  . Not on file  Social Needs  .  Financial resource strain: Not on file  . Food insecurity:    Worry: Not on file    Inability: Not on file  . Transportation needs:    Medical: Not on file    Non-medical: Not on file  Tobacco Use  . Smoking status: Never Smoker  . Smokeless tobacco: Never Used  Substance and Sexual Activity  . Alcohol use: No    Alcohol/week: 0.0 standard drinks  . Drug use: No  . Sexual activity: Never  Lifestyle  . Physical activity:    Days per week: Not on file    Minutes per session: Not on file  . Stress: Not on file  Relationships  . Social connections:    Talks on phone: Not on file    Gets together: Not on file    Attends religious service: Not on file    Active member of club or organization: Not on file    Attends  meetings of clubs or organizations: Not on file    Relationship status: Not on file  . Intimate partner violence:    Fear of current or ex partner: Not on file    Emotionally abused: Not on file    Physically abused: Not on file    Forced sexual activity: Not on file  Other Topics Concern  . Not on file  Social History Narrative   Lives with sister, no pets   Occupation: disabled, for bipolar and arthritis   Edu: GED   Activity: take walks   Diet: good water, vegetables daily   Religion: [place], Dr. Jimmye Norman (ph [phone])   Allergies  Allergen Reactions  . Risperidone And Related Other (See Comments)    Reaction:  Altered mental status   . Penicillins Other (See Comments)    Pt states that this med knocks her out.  Has patient had a PCN reaction causing immediate rash, facial/tongue/throat swelling, SOB or lightheadedness with hypotension Unsure  Has patient had a PCN reaction causing severe rash involving mucus membranes or skin necrosis Unsure  Has patient had a PCN reaction that required hospitalization Unsure  Has patient had a PCN reaction occurring within the last 10 years Unsure  If all of the above answers are "NO", then may proceed with Cephalosporin use.  Clementeen Hoof [Iodinated Diagnostic Agents] Rash  . Tetanus Toxoids Rash       . Zetia [Ezetimibe] Rash    Last set of Vital Signs (not current) Vitals:   03/11/18 1422 03/11/18 2005  BP: (!) 178/89 140/90  Pulse: 89 85  Resp: 18 18  Temp: 98.2 F (36.8 C) 98 F (36.7 C)  SpO2: 90% 100%      Physical Exam Gen: unresponsive Cardiovascular: pulseless  Resp: apneic. Breath sounds equal bilaterally with bagging  Abd: Moderate distention.  Copious dark emesis. Neuro: GCS 3, unresponsive to pain  HEENT: Dark emesis in pharynx.  No gag reflex Neck: No crepitus  Musculoskeletal: No deformity  Skin: warm  Procedures  INTUBATION Performed by: Harvest Dark Required items: required blood products, implants, devices, and special equipment available Patient identity confirmed: provided demographic data and hospital-assigned identification number Time out: Immediately prior to procedure a "time out" was called to verify the correct patient, procedure, equipment, support staff and site/side marked as required. Indications: Cardiac arrest/respiratory arrest Intubation method: Direct laryngoscopy Preoxygenation: 100% BVM Sedatives: None Paralytic: None Tube Size: 7.0 cuffed Post-procedure assessment: chest rise and  ETCO2 monitor Breath sounds: equal and absent over the epigastrium Tube secured by Respiratory Therapy Patient tolerated the procedure well with no immediate complications.  CRITICAL CARE Performed by: Harvest Dark Total critical care time: 70 minutes Critical care time was exclusive of separately billable procedures and treating other patients. Critical care was necessary to treat or prevent imminent or life-threatening deterioration. Critical care was time spent personally by me on the following activities: development of treatment plan with patient and/or surrogate as well as nursing, discussions with consultants, evaluation of patient's response to treatment, examination of patient, obtaining history from patient or surrogate, ordering and performing treatments and interventions, ordering and review of laboratory studies, ordering and review of radiographic studies, pulse oximetry and re-evaluation of patient's condition.  Cardiopulmonary Resuscitation (CPR) Procedure Note  Directed/Performed by: Harvest Dark I personally directed ancillary staff and/or performed CPR in an effort to regain return of spontaneous circulation and to maintain cardiac, neuro and systemic perfusion.   CENTRAL LINE Performed by: Harvest Dark Consent: The procedure was performed in an emergent situation. Required items: required  blood products, implants, devices, and special equipment available Patient identity confirmed: arm band and provided demographic data Time out: Immediately prior to procedure a "time out" was called to verify the correct patient, procedure, equipment, support staff and site/side marked as required. Indications: vascular access Anesthesia: local infiltration Local anesthetic: lidocaine 1% with epinephrine Anesthetic total: 3 ml Patient sedated: no Preparation: skin prepped with 2% chlorhexidine Skin prep agent dried: skin prep agent completely dried prior to procedure Sterile barriers: all five maximum sterile barriers used - cap, mask, sterile gown, sterile gloves, and large sterile sheet Hand hygiene: hand hygiene performed prior to central venous catheter insertion  Location details: Right femoral  Catheter type: triple lumen Catheter size: 8 Fr Pre-procedure: landmarks identified Ultrasound guidance: No Successful placement: yes Post-procedure: line sutured and dressing applied Assessment: blood return through all parts, free fluid flow, placement verified by x-ray and no pneumothorax on x-ray Patient tolerance: Patient tolerated the procedure well with no immediate complications.    Medical Decision making  Patient was found to be in cardiac arrest, at the time of my arrival CPR was in progress with chest compressions only.  We first started bag valve ventilations upon my arrival no pulse on palpation CPR continued.  Patient had copious amounts of black vomit, direct laryngoscopy intubation performed by myself.  Good air movement with CO2 color change.  Patient had no IV access upon arrival.  Nurses attempted without success.  Triple-lumen right femoral line placed by myself.  CPR was continued.  Initially patient was in asystole, transitioned to PEA.  Multiple rounds of medications given including epinephrine calcium and bicarbonate.  The patient then transition to a ventricular  tachycardia and was shocked.  CPR was continued patient been found to be in coarse ventricular fibrillation and shocked.  CPR was continued and eventually pulses returned.  Shortly after pulses return there were lost once again.  Amiodarone given and CPR was continued.  Patient was shocked multiple times, given multiple rounds of medications including amiodarone, bicarbonate, calcium, epinephrine (see code sheet for all medications).  Pulses returned and the patient was transported from the behavioral health unit to the ICU.  The patient lost pulses and Lucas device was used to continue chest compressions.  Upon arrival to the intensive care unit patient had return of spontaneous circulation once again.  Had blood pressures initially in the 60s was started on dopamine and then  on epinephrine drips.  Blood pressures were in the upper 90s with heart rate around 60 continued to bradycardia down and lose a pulse on multiple occasions.  However after both drips including epinephrine was running heart rate was sustained in the 70s or 80s.  Unfortunately due to multiple simultaneous codes being performed, there was no physician to sign out care tear.  I continue to care for this patient for approximately an hour and a half, until the intensivist was available.  Once the intensivist was available care was transferred to the intensivist.  At the time the care was transferred we did not have lab results yet known.  Patient had a spontaneous pulse at that time.  Assessment and Plan  Continue resuscitative efforts as needed patient care transitioned to intensivist.    Harvest Dark, MD 04/24/18 1145

## 2018-05-04 NOTE — H&P (Signed)
[facility] NAME: Gina [name]    MR#:  [id number]  DATE OF BIRTH:  1951/04/04  DATE OF ADMISSION:  04/23/2018  PRIMARY CARE PHYSICIAN: Ria Bush, MD   REQUESTING/REFERRING PHYSICIAN:   CHIEF COMPLAINT:  No chief complaint on file.   HISTORY OF PRESENT ILLNESS: Gina [name]  is a 67 y.o. female with a known history per below transferred to the ICU from outside facility, patient was in acute cardiac arrest, ACLS protocol initiated, code ran by ED attending, patient noted to have hemodynamic instability with ventricular tachycardia, appears a severe bradycardia, severe hypotension, black coffee-ground emesis noted, was placed on amiodarone bolus with drip, epinephrine drip, dopamine, hospitalist staff asked to assist in case, discussed with ICU team-sister was contacted whom is the primary care provider/lives with the patient, per the patient's sister-patient never wanted to be intubated/given extreme poor prognosis-sister wanted her sister extubated per her prior wishes/okay for comfort care going forward with expectant management, all questions were answered, information related to ICU team.  PAST MEDICAL HISTORY:   Past Medical History:  Diagnosis Date  . Anemia in chronic kidney disease   . Bipolar depression [facility])    sees psychiatrist - psych admission 03/2015  . Bladder cancer ([place]) 1990's  . Blood transfusion without reported diagnosis 2009  . Cataract    left  . CKD (chronic kidney disease) stage 3, GFR 30-59 ml/min (HCC)   . Colon cancer ([place]) 1990's  . DJD (degenerative joint disease), lumbar    chronic lower back pain  . Enterocutaneous fistula 04/07/2012   completed PT 06/2012 ([facility])  . Family history of breast cancer   . Family history of colon cancer   . Family history of stomach cancer   . GERD (gastroesophageal reflux disease)   . History of bladder cancer 1997  . History of colon cancer    s/p  surgery  . History of uterine cancer    s/p hysterectomy  . Hyperlipidemia   . Hypertension   . IDA (iron deficiency anemia) 01/2013   thought due to h/o polyps  . Obesity (BMI 30-39.9)   . OSA on CPAP    6cm H2O  . Personal history of colonic adenomas and colon cancer 11/11/2012  . Positive hepatitis C antibody test 09/2014   but negative confirmatory testing  . RBBB   . Uncontrolled type 2 diabetes mellitus with nephropathy ([place]) 06/07/2014   completed DSME 02/2016    PAST SURGICAL HISTORY:  Past Surgical History:  Procedure Laterality Date  . BREAST BIOPSY Right 01/2014   fibroadenoma  . COLONOSCOPY  07/2009  . COLONOSCOPY  11/2012   2 tubular adenomas, mild diverticulosis, pending genetic testing for Lynch syndrome [name]) rpt 2 yrs  . COLONOSCOPY  02/2015   TA, diverticulosis, rpt 2 yrs [name])  . COLONOSCOPY WITH PROPOFOL N/A 06/09/2017   TAx1, rpt 2 yrs [name], [name], MD)  . DEXA  12/2009   WNL  . DOBUTAMINE STRESS ECHO  12/2009   no evidence of ischemia  . HERNIA REPAIR  02/04/12  . i & d abdominal wound  02/19/12  . LEFT OOPHORECTOMY  2005  . PARTIAL COLECTOMY  about 2008   for colon cancer  . PARTIAL HYSTERECTOMY  1981   uterine cancer, R ovary remains  . Reexploration of abdominal wound and Allograft placemet  02/26/12  . Removal of infected mesh and abdominal wound vac placement  02/24/12  . sleep study  02/2014   OSA - AHI 55, nadir 81% [name])    SOCIAL HISTORY:  Social History   Tobacco Use  . Smoking status: Never Smoker  . Smokeless tobacco: Never Used  Substance Use Topics  . Alcohol use: No    Alcohol/week: 0.0 standard drinks    FAMILY HISTORY:  Family History  Problem Relation Age of Onset  . Colon cancer Mother 62  . Stomach cancer Mother        dx in her 54s?  . CAD Father        MI; deceased 64  . Mental illness Sister        anxiety/depression  . Thyroid disease Sister   . Breast cancer Maternal Grandmother        age at dx unknown   . Diabetes Brother   . Diabetes Brother   . Diabetes Other        aunts and uncles both sides  . Arthritis Other        strong fmhx  . Colon cancer Other 67       maternal half-sister; deceased    DRUG ALLERGIES:  Allergies  Allergen Reactions  . Risperidone And Related Other (See Comments)    Reaction:  Altered mental status   . Penicillins Other (See Comments)    Pt states that this med knocks her out.  Has patient had a PCN reaction causing immediate rash, facial/tongue/throat swelling, SOB or lightheadedness with hypotension Unsure  Has patient had a PCN reaction causing severe rash involving mucus membranes or skin necrosis Unsure  Has patient had a PCN reaction that required hospitalization Unsure  Has patient had a PCN reaction occurring within the last 10 years Unsure  If all of the above answers are "NO", then may proceed with Cephalosporin use.  Clementeen Hoof [Iodinated Diagnostic Agents] Rash  . Tetanus Toxoids Rash       . Zetia [Ezetimibe] Rash    REVIEW OF SYSTEMS: Able to be obtained given comatose state  CONSTITUTIONAL: No fever, fatigue or weakness.  EYES: No blurred or double vision.  EARS, NOSE, AND THROAT: No tinnitus or ear pain.  RESPIRATORY: No cough, shortness of breath, wheezing or hemoptysis.  CARDIOVASCULAR: No chest pain, orthopnea, edema.  GASTROINTESTINAL: No nausea, vomiting, diarrhea or abdominal pain.  GENITOURINARY: No dysuria, hematuria.  ENDOCRINE: No polyuria, nocturia,  HEMATOLOGY: No anemia, easy bruising or bleeding SKIN: No rash or lesion. MUSCULOSKELETAL: No joint pain or arthritis.   NEUROLOGIC: No tingling, numbness, weakness.  PSYCHIATRY: No anxiety or depression.   MEDICATIONS AT HOME:  Prior to Admission medications   Medication Sig Start Date End Date Taking? Authorizing Provider  ACCU-CHEK AVIVA PLUS test strip 1 each by Other route daily. E11.21 09/18/17   Ria Bush, MD  bisacodyl (DULCOLAX) 10 MG suppository  Place 1 suppository (10 mg total) rectally daily as needed for moderate constipation. Patient not taking: Reported on 03/16/2018 11/23/17   Pucilowska, Wardell Honour, MD  carbamazepine (TEGRETOL) 200 MG tablet Take 1 tablet (200 mg total) by mouth 2 (two) times daily at 8 am and 10 pm. 07-Apr-2018   Pucilowska, Jolanta B, MD  docusate sodium (COLACE) 100 MG capsule Take 2 capsules (200 mg total) by mouth 2 (two) times daily. 11/23/17   Pucilowska, Wardell Honour, MD  GAVILAX powder TAKE 17 GRAMS BY MOUTH TWICE DAILY AS NEEDED FOR MODERATE CONSTIPATION. Patient taking differently: Take 17 g by mouth 2 (two) times daily as needed for moderate constipation.  02/11/18   Ria Bush, MD  levothyroxine (SYNTHROID, LEVOTHROID) 50 MCG tablet Take 1 tablet (50 mcg total) by mouth daily before breakfast. 10/21/17   Ria Bush, MD  metoprolol tartrate (LOPRESSOR) 25 MG tablet Take 0.5 tablets (12.5 mg total) by mouth 2 (two) times daily. 11/23/17   Pucilowska, Herma Ard B, MD  QUEtiapine (SEROQUEL) 300 MG tablet Take 1 tablet (300 mg total) by mouth at bedtime. 03/01/18   Ria Bush, MD  senna (SENOKOT) 8.6 MG TABS tablet Take 1 tablet (8.6 mg total) by mouth at bedtime as needed for mild constipation. Patient not taking: Reported on 03/16/2018 11/09/17   Pucilowska, Herma Ard B, MD  simvastatin (ZOCOR) 20 MG tablet Take 1 tablet (20 mg total) by mouth at bedtime. 07/03/17   Ria Bush, MD  sitaGLIPtin (JANUVIA) 25 MG tablet Take 1 tablet (25 mg total) by mouth daily. 02/18/18   Ria Bush, MD      PHYSICAL EXAMINATION:   VITAL SIGNS: SpO2 94 %.  GENERAL:  67 y.o.-year-old patient lying in the bed with no acute distress.  Morbidly obese EYES: Pupils equal, round, reactive to light and accommodation. No scleral icterus. Extraocular muscles intact.  HEENT: Head atraumatic, normocephalic. Oropharynx and nasopharynx clear.  NECK:  Supple, no jugular venous distention. No thyroid enlargement, no  tenderness.  LUNGS: Rhonchi throughout, intubated   CARDIOVASCULAR: S1, S2 normal. No murmurs, rubs, or gallops.  ABDOMEN: Soft, nontender, nondistended. Bowel sounds present. No organomegaly or mass.  EXTREMITIES: No pedal edema, cyanosis, or clubbing.  NEUROLOGIC: PERRLA, comatose  PYSCHIATRIC: Comatose  SKIN: No obvious rash, lesion, or ulcer.   LABORATORY PANEL:   CBC Recent Labs  Lab 03/30/18 1523 04/03/18 0708  WBC 7.2 7.5  HGB 11.2* 12.0  HCT 30.7* 33.1*  PLT 174 152  MCV 94.5 94.8  MCH 34.4* 34.4*  MCHC 36.4* 36.3*  RDW 14.4 14.5  LYMPHSABS 1.5  --   MONOABS 0.6  --   EOSABS 0.1  --   BASOSABS 0.0  --    ------------------------------------------------------------------------------------------------------------------  Chemistries  Recent Labs  Lab 03/30/18 1523 03/31/18 0633 04/03/18 0708 11-Apr-2018 1007  NA 129* 130* 128* 123*  K 5.3* 5.3* 4.9 4.1  CL 95* 96* 93* 83*  CO2 23 24 23  20*  GLUCOSE 126* 102* 140* 363*  BUN 41* 41* 39* 50*  CREATININE 1.74* 1.94* 1.59* 2.53*  CALCIUM 9.0 9.2 9.3 PENDING  AST 21  --   --  43*  ALT 25  --   --  31  ALKPHOS 93  --   --  86  BILITOT 0.5  --   --  0.6   ------------------------------------------------------------------------------------------------------------------ CrCl cannot be calculated (Unknown ideal weight.). ------------------------------------------------------------------------------------------------------------------ No results for input(s): TSH, T4TOTAL, T3FREE, THYROIDAB in the last 72 hours.  Invalid input(s): FREET3   Coagulation profile No results for input(s): INR, PROTIME in the last 168 hours. ------------------------------------------------------------------------------------------------------------------- No results for input(s): DDIMER in the last 72 hours. -------------------------------------------------------------------------------------------------------------------  Cardiac  Enzymes Recent Labs  Lab 04-11-2018 1007  TROPONINI 0.09*   ------------------------------------------------------------------------------------------------------------------ Invalid input(s): POCBNP  ---------------------------------------------------------------------------------------------------------------  Urinalysis    Component Value Date/Time   COLORURINE COLORLESS (A) 11/29/2017 0648   APPEARANCEUR CLEAR (A) 11/29/2017 0648   APPEARANCEUR Clear 05/18/2014 0825   LABSPEC 1.004 (L) 11/29/2017 0648   LABSPEC 1.015 05/18/2014 0825   PHURINE 6.0 11/29/2017 0648   GLUCOSEU NEGATIVE 11/29/2017 0648   GLUCOSEU Negative 05/18/2014 0825   HGBUR NEGATIVE 11/29/2017 [place] 11/29/2017 6384  BILIRUBINUR neg 02/26/2017 0921   BILIRUBINUR Negative 05/18/2014 0825   KETONESUR NEGATIVE 11/29/2017 0648   PROTEINUR NEGATIVE 11/29/2017 0648   UROBILINOGEN 0.2 02/26/2017 0921   UROBILINOGEN 0.2 04/07/2012 1104   NITRITE NEGATIVE 11/29/2017 0648   LEUKOCYTESUR NEGATIVE 11/29/2017 0648   LEUKOCYTESUR Negative 05/18/2014 0825     RADIOLOGY: Dg Abd 2 Views  Result Date: 04/04/2018 CLINICAL DATA:  Nausea, vomiting and abdominal pain beginning yesterday. EXAM: ABDOMEN - 2 VIEW COMPARISON:  CT abdomen and pelvis 11/29/2017. Chest in two views abdomen 05/04/2015. FINDINGS: The bowel gas pattern is normal. There is no evidence of free air. No radio-opaque calculi or other significant radiographic abnormality is seen. IMPRESSION: Negative exam. Electronically Signed   By: Inge Rise M.D.   On: 04/04/2018 12:15    EKG: Orders placed or performed during the hospital encounter of 11/29/17  . ED EKG  . ED EKG  . EKG    IMPRESSION AND PLAN: *Acute cardiac arrest *Acute ventricular tachycardia *Acute comatose state *Acute GI bleeding  In discussion with the patient's sister, admit to hospital, comfort care measures with expectant management per patient's  sister/primary caregiver given dismal prognosis, case discussed with ED attending and intensivist as well as supporting ICU staff  All the records are reviewed and case discussed with ED provider. Management plans discussed with the patient, family and they are in agreement.  CODE STATUS:    Code Status Orders  (From admission, onward)         Start     Ordered   Apr 22, 2018 1040  Do not attempt resuscitation (DNR)  Continuous    Question Answer Comment  In the event of cardiac or respiratory ARREST Do not call a "code blue"   In the event of cardiac or respiratory ARREST Do not perform Intubation, CPR, defibrillation or ACLS   In the event of cardiac or respiratory ARREST Use medication by any route, position, wound care, and other measures to relive pain and suffering. May use oxygen, suction and manual treatment of airway obstruction as needed for comfort.   Comments nurse may pronounce      2018/04/22 1041        Code Status History    Date Active Date Inactive Code Status Order ID Comments User Context   03/16/2018 1506 April 22, 2018 1017 Full Code [id number]  Clovis Fredrickson, MD Inpatient   03/15/2018 2331 03/16/2018 1500 Full Code [id number]  Lance Coon, MD Inpatient   03/11/2018 2329 03/15/2018 2305 Full Code [id number]  Clovis Fredrickson, MD Inpatient   11/13/2017 2220 11/24/2017 1542 Full Code [id number]  [name], MD Inpatient   11/04/2017 1746 11/09/2017 1600 Full Code [id number]  [name], MD Inpatient   12/03/2015 2043 12/19/2015 1439 Full Code [id number]  [name], MD Inpatient   04/13/2012 1506 05/15/2012 1637 Full Code [id number]  Ermalinda Memos Inpatient       TOTAL TIME TAKING CARE OF THIS PATIENT: 45 minutes.    Avel Peace Nickoli Bagheri M.D on Apr 22, 2018   Between 7am to 6pm - Pager - [phone]  After 6pm go to [email or web address] - password [email or web address] [facility]  Office  [phone]  CC: Primary care physician; Ria Bush,  MD   Note: This dictation was prepared with Dragon dictation along with smaller phrase technology. Any transcriptional errors that result from this process are unintentional.

## 2018-05-04 DEATH — deceased

## 2018-05-12 IMAGING — US US ABDOMEN COMPLETE
1 series · 13 of 25 positions shown · non-contrast
Comparison: CT abdomen and pelvis 01/05/2015

CLINICAL DATA: Thrombocytopenia, type II diabetes mellitus, bladder
cancer, colon cancer, hypertension

EXAM:
ABDOMEN ULTRASOUND COMPLETE

[Series 1: us abdomen complete · 0.22mm/px · 13 of 112 slices shown]
[im 1/112]
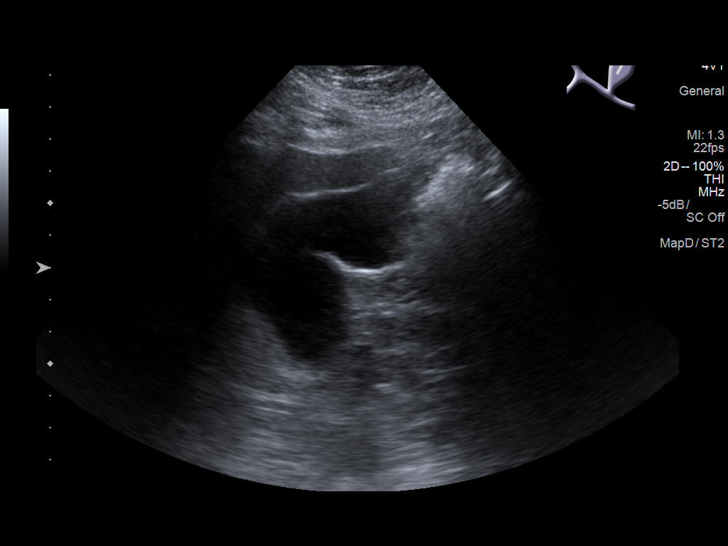
[im 10/112]
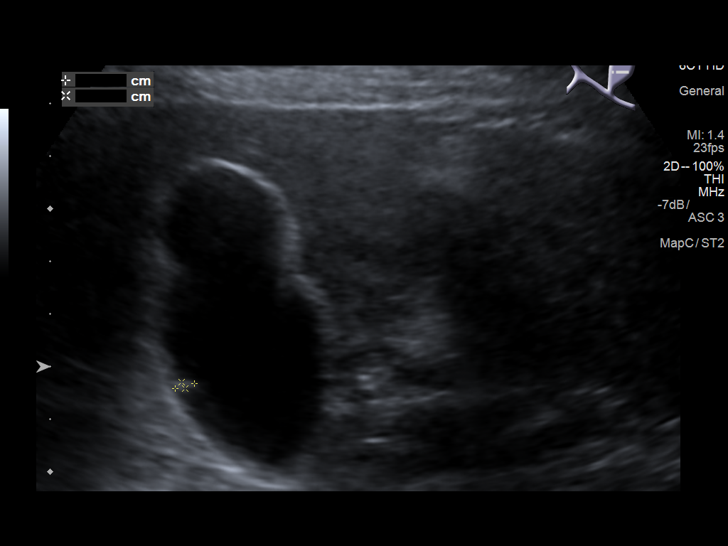
[im 19/112]
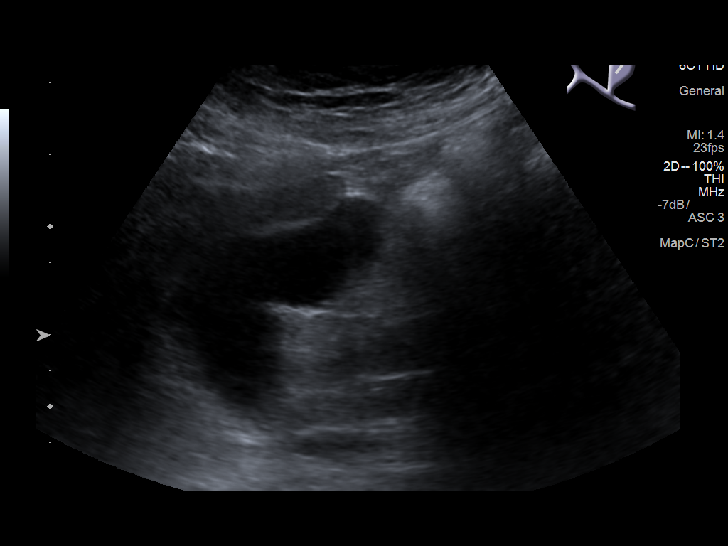
[im 28/112]
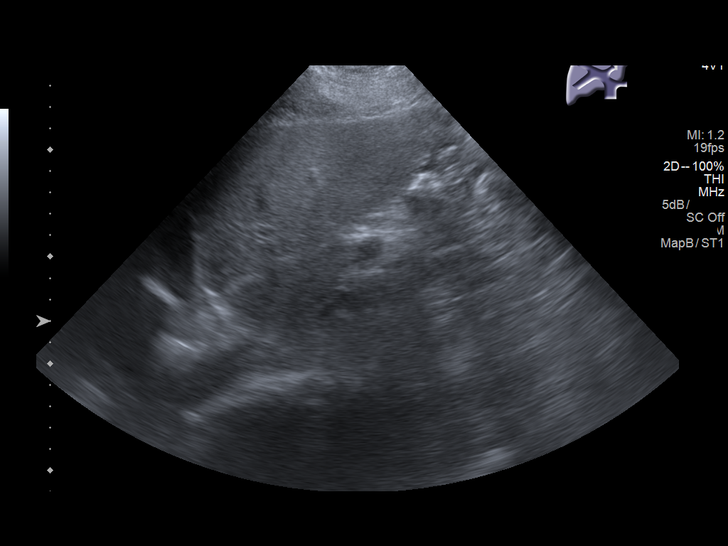
[im 38/112]
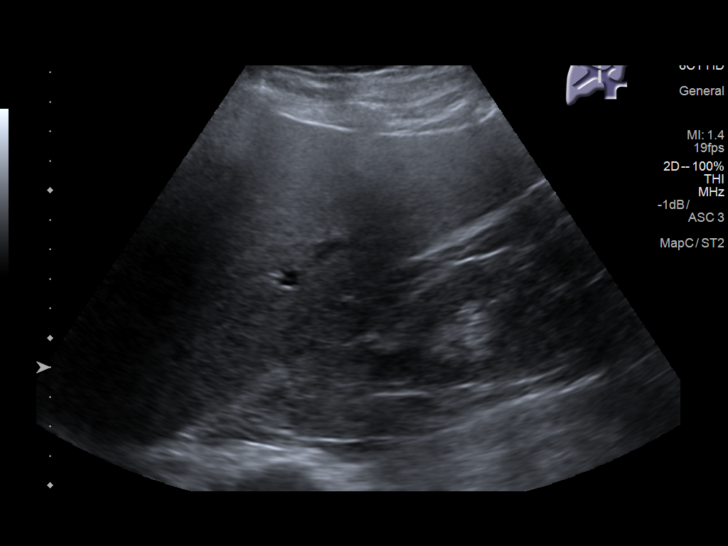
[im 47/112]
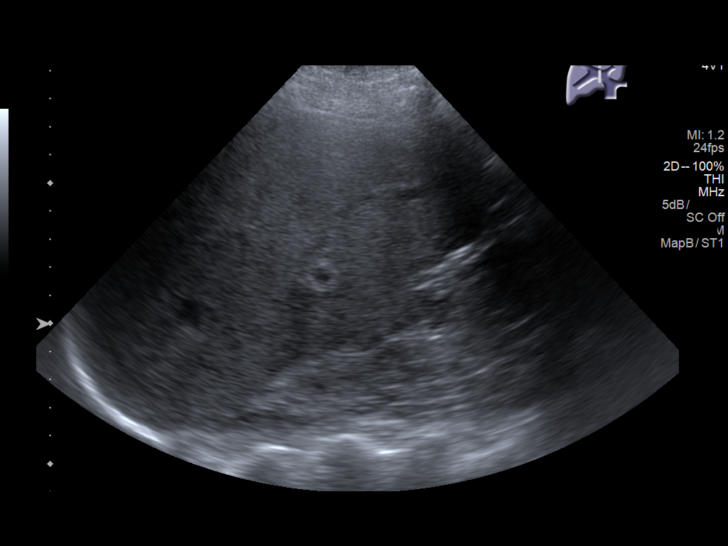
[im 56/112]
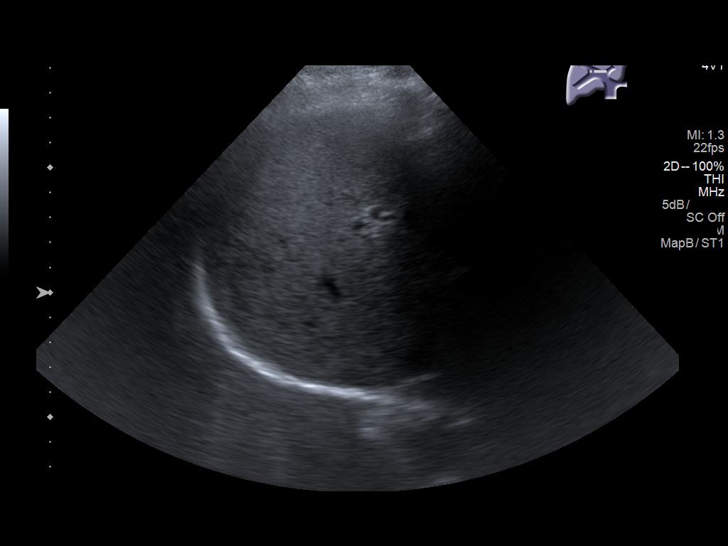
[im 65/112]
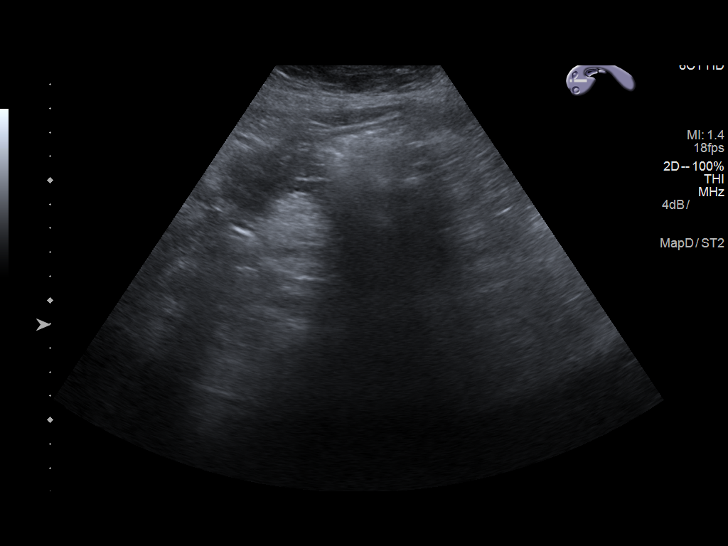
[im 75/112]
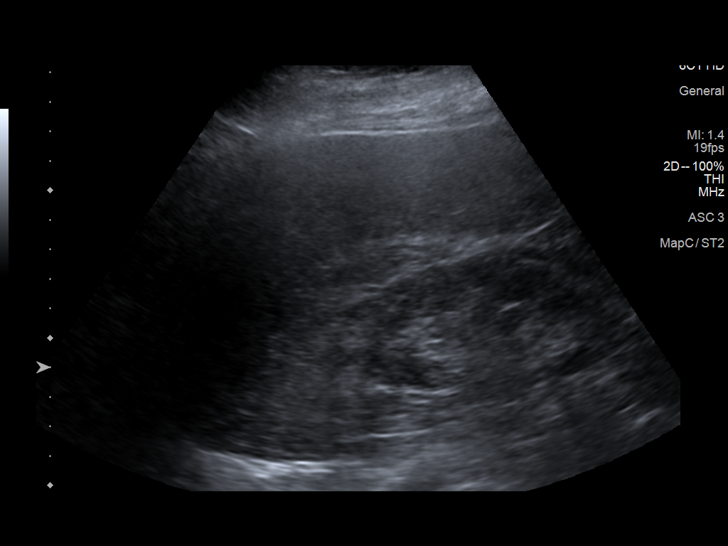
[im 84/112]
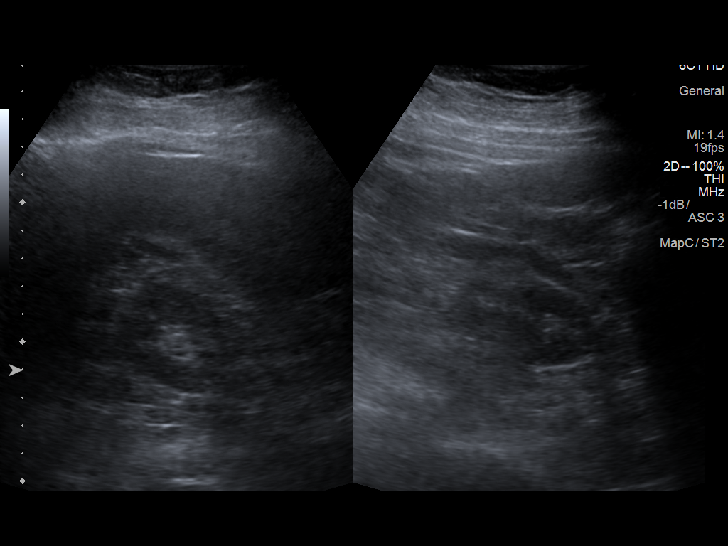
[im 93/112]
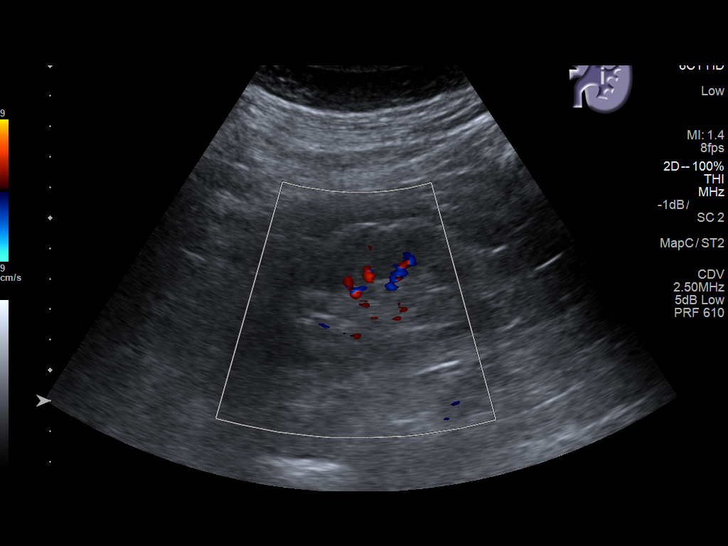
[im 102/112]
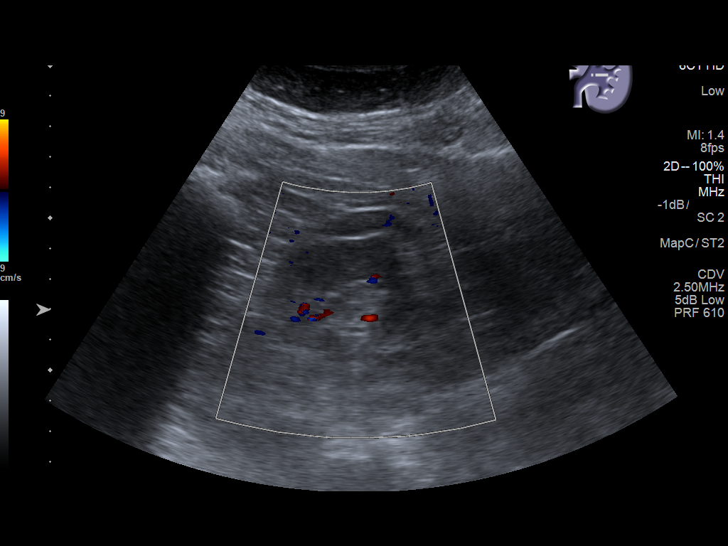
[im 112/112]
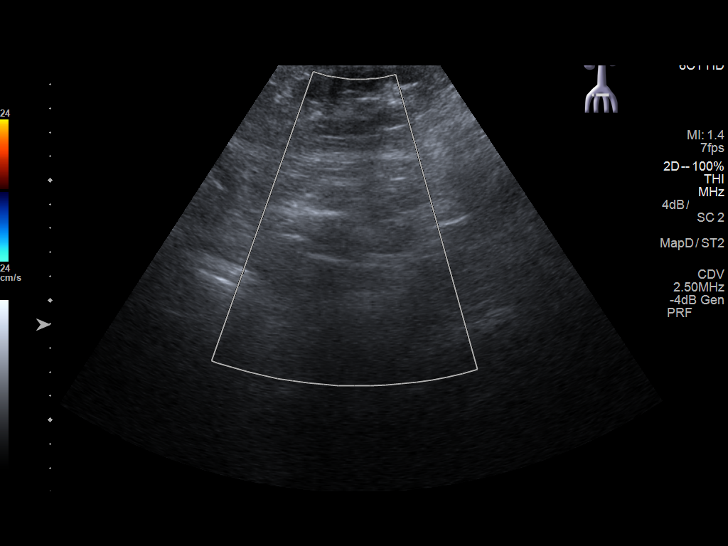

[13 of 25 positions shown; findings below may reference images not displayed]

FINDINGS: Gallbladder: Normally distended without wall thickening or
sonographic Murphy sign. Tiny gallbladder polyp visualized measuring
4 x 1 x 7 mm. No definite shadowing calculi or pericholecystic
fluid.

Common bile duct: Diameter: 3 mm diameter , normal

Liver: Probably mildly echogenic parenchyma, likely fatty
infiltration though this can be seen with cirrhosis and certain
infiltrative disorders. No definite mass lesion or nodularity.
Normal appearance

IVC: Normal appearance

Pancreas: Tail incompletely visualized due to bowel gas. Visualized
portion normal appearance

Spleen: Normal appearance, 10.6 cm length

Right Kidney: Length: 9.9 cm. Cortical thinning. Increased cortical
echogenicity. Question minimally complicated cyst at upper pole
RIGHT kidney 13 x 12 x 14 mm. No additional mass or hydronephrosis.

Left Kidney: Length: 9.9 cm. Normal cortical thickness. Slightly
increased cortical echogenicity. Small cyst at inferior pole likely
small cyst 2.0 x 1.3 x 1.3 cm, noted on prior CT. No additional mass
lesion or hydronephrosis.

Abdominal aorta: Visualized portion normal caliber with distal aorta
obscured by bowel gas.

Other findings: No free fluid
IMPRESSION: Probable mild fatty infiltration of liver.

Medical renal disease changes of both kidneys with small BILATERAL
renal cysts.

7 mm gallbladder polyp; yearly follow-up by ultrasound recommended
to assess stability.

This recommendation follows ACR consensus guidelines: White Paper of
the ACR Incidental Findings Committee II on Gallbladder and Biliary
Findings. [HOSPITAL] 3897:;[DATE].

## 2018-07-23 ENCOUNTER — Ambulatory Visit: Payer: Self-pay

## 2018-07-26 ENCOUNTER — Encounter: Payer: Self-pay | Admitting: Family Medicine
